# Patient Record
Sex: Female | Born: 1940 | Race: White | Hispanic: No | Marital: Married | Smoking: Never smoker
Health system: Southern US, Community
[De-identification: ages and names within clinical notes are randomized; demographics above are authoritative.]

## PROBLEM LIST (undated history)

## (undated) DIAGNOSIS — E785 Hyperlipidemia, unspecified: Secondary | ICD-10-CM

## (undated) DIAGNOSIS — R519 Headache, unspecified: Secondary | ICD-10-CM

## (undated) DIAGNOSIS — F419 Anxiety disorder, unspecified: Secondary | ICD-10-CM

## (undated) DIAGNOSIS — H8110 Benign paroxysmal vertigo, unspecified ear: Secondary | ICD-10-CM

## (undated) DIAGNOSIS — I10 Essential (primary) hypertension: Secondary | ICD-10-CM

## (undated) DIAGNOSIS — R002 Palpitations: Secondary | ICD-10-CM

## (undated) DIAGNOSIS — Z87442 Personal history of urinary calculi: Secondary | ICD-10-CM

## (undated) DIAGNOSIS — T7840XA Allergy, unspecified, initial encounter: Secondary | ICD-10-CM

## (undated) DIAGNOSIS — I639 Cerebral infarction, unspecified: Secondary | ICD-10-CM

## (undated) DIAGNOSIS — Z950 Presence of cardiac pacemaker: Secondary | ICD-10-CM

## (undated) HISTORY — DX: Cerebral infarction, unspecified: I63.9

## (undated) HISTORY — DX: Benign paroxysmal vertigo, unspecified ear: H81.10

## (undated) HISTORY — DX: Allergy, unspecified, initial encounter: T78.40XA

## (undated) HISTORY — PX: CATARACT EXTRACTION: SUR2

## (undated) HISTORY — DX: Palpitations: R00.2

## (undated) HISTORY — DX: Hyperlipidemia, unspecified: E78.5

## (undated) HISTORY — DX: Essential (primary) hypertension: I10

## (undated) NOTE — *Deleted (*Deleted)
  Problem: Education: Goal: Knowledge of disease or condition will improve Outcome: Progressing Goal: Knowledge of secondary prevention will improve Outcome: Progressing Goal: Knowledge of patient specific risk factors addressed and post discharge goals established will improve Outcome: Progressing Goal: Individualized Educational Video(s) Outcome: Progressing   Problem: Coping: Goal: Will verbalize positive feelings about self Outcome: Progressing   Problem: Health Behavior/Discharge Planning: Goal: Ability to manage health-related needs will improve Outcome: Progressing   Problem: Self-Care: Goal: Ability to participate in self-care as condition permits will improve Outcome: Progressing   Problem: Nutrition: Goal: Risk of aspiration will decrease Outcome: Progressing   Problem: Ischemic Stroke/TIA Tissue Perfusion: Goal: Complications of ischemic stroke/TIA will be minimized Outcome: Progressing   Problem: Education: Goal: Knowledge of General Education information will improve Description: Including pain rating scale, medication(s)/side effects and non-pharmacologic comfort measures Outcome: Progressing   Problem: Health Behavior/Discharge Planning: Goal: Ability to manage health-related needs will improve Outcome: Progressing   Problem: Clinical Measurements: Goal: Ability to maintain clinical measurements within normal limits will improve Outcome: Progressing Goal: Will remain free from infection Outcome: Progressing Goal: Diagnostic test results will improve Outcome: Progressing Goal: Respiratory complications will improve Outcome: Progressing Goal: Cardiovascular complication will be avoided Outcome: Progressing   Problem: Activity: Goal: Risk for activity intolerance will decrease Outcome: Progressing   Problem: Nutrition: Goal: Adequate nutrition will be maintained Outcome: Progressing   Problem: Coping: Goal: Level of anxiety will decrease  Outcome: Progressing   Problem: Elimination: Goal: Will not experience complications related to bowel motility Outcome: Progressing Goal: Will not experience complications related to urinary retention Outcome: Progressing   Problem: Pain Managment: Goal: General experience of comfort will improve Outcome: Progressing   Problem: Safety: Goal: Ability to remain free from injury will improve Outcome: Progressing   Problem: Skin Integrity: Goal: Risk for impaired skin integrity will decrease Outcome: Progressing

---

## 2003-12-10 ENCOUNTER — Observation Stay (HOSPITAL_COMMUNITY): Admission: RE | Admit: 2003-12-10 | Discharge: 2003-12-11 | Payer: Self-pay | Admitting: Orthopedic Surgery

## 2003-12-10 HISTORY — PX: OTHER SURGICAL HISTORY: SHX169

## 2004-07-07 ENCOUNTER — Ambulatory Visit: Payer: Self-pay | Admitting: Family Medicine

## 2004-08-12 ENCOUNTER — Other Ambulatory Visit: Admission: RE | Admit: 2004-08-12 | Discharge: 2004-08-12 | Payer: Self-pay | Admitting: Family Medicine

## 2004-08-12 ENCOUNTER — Ambulatory Visit: Payer: Self-pay | Admitting: Family Medicine

## 2004-12-16 ENCOUNTER — Ambulatory Visit: Payer: Self-pay | Admitting: Family Medicine

## 2006-05-08 ENCOUNTER — Ambulatory Visit: Payer: Self-pay | Admitting: Family Medicine

## 2008-03-07 ENCOUNTER — Encounter: Payer: Self-pay | Admitting: Family Medicine

## 2009-06-16 ENCOUNTER — Ambulatory Visit: Payer: Self-pay | Admitting: Family Medicine

## 2009-06-16 DIAGNOSIS — H8110 Benign paroxysmal vertigo, unspecified ear: Secondary | ICD-10-CM | POA: Insufficient documentation

## 2009-06-16 DIAGNOSIS — J309 Allergic rhinitis, unspecified: Secondary | ICD-10-CM | POA: Insufficient documentation

## 2010-05-26 NOTE — Procedures (Signed)
Summary: Colonoscopy Report/[facility]  Colonoscopy Report/[facility]   Imported By: Maryln Gottron 03/13/2008 15:16:32  _____________________________________________________________________  External Attachment:    Type:   Image     Comment:   External Document

## 2010-05-26 NOTE — Assessment & Plan Note (Signed)
Summary: INNER EYE PROBLEMS/[facility]   Vital Signs:  Patient profile:   70 year old female Weight:      140 pounds Temp:     98.0 degrees F oral Pulse rate:   73 / minute BP sitting:   132 / 94  (left arm) Cuff size:   regular  Vitals Entered By: Alfred Levins, CMA (June 16, 2009 3:06 PM) CC: dizzy, lt inner ear pain, sinus infection   History of Present Illness: Here for one week of dizziness which she describes as the room spinning. Sudden movements of the head up or down bring this on, also getting up and down from bed. Side to side movements do not. These spells last just a few minutes, and they are often accompanied by slight nausea. No HAs. No blurred vision. No fever. She has had a lot of sinus congestion over the winter.   Current Medications (verified): 1)  None  Allergies (verified): 1)  ! Tylenol  Past History:  Past Medical History: frequent UTIs, saw Dr. Retta Diones  Past Surgical History: Tubal ligation Colonoscopy 03-07-08 per Dr. 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 Juliene Pina  Review of Systems  The patient denies anorexia, fever, weight loss, weight gain, vision loss, decreased hearing, hoarseness, chest pain, syncope, dyspnea on exertion, peripheral edema, prolonged cough, headaches, hemoptysis, abdominal pain, melena, hematochezia, severe indigestion/heartburn, hematuria, incontinence, genital sores, muscle weakness, suspicious skin lesions, transient blindness, difficulty walking, depression, unusual weight change, abnormal bleeding, enlarged lymph nodes, angioedema, breast masses, and testicular masses.    Physical Exam  General:  Well-developed,well-nourished,in no acute distress; alert,appropriate and cooperative throughout examination Head:  Normocephalic and atraumatic without obvious abnormalities. No apparent alopecia or balding. Eyes:  No corneal or conjunctival  inflammation noted. EOMI. Perrla. Funduscopic exam benign, without hemorrhages, exudates or papilledema. Vision grossly normal. Ears:  External ear exam shows no significant lesions or deformities.  Otoscopic examination reveals clear canals, tympanic membranes are intact bilaterally without bulging, retraction, inflammation or discharge. Hearing is grossly normal bilaterally. Nose:  External nasal examination shows no deformity or inflammation. Nasal mucosa are pink and moist without lesions or exudates. Mouth:  Oral mucosa and oropharynx without lesions or exudates.  Teeth in good repair. Neck:  No deformities, masses, or tenderness noted. Lungs:  Normal respiratory effort, chest expands symmetrically. Lungs are clear to auscultation, no crackles or wheezes. Heart:  Normal rate and regular rhythm. S1 and S2 normal without gallop, murmur, click, rub or other extra sounds. Neurologic:  alert & oriented X3, cranial nerves II-XII intact, strength normal in all extremities, and gait normal.     Impression & Recommendations:  Problem # 1:  BENIGN POSITIONAL VERTIGO (ICD-386.11)  Her updated medication list for this problem includes:    Meclizine Hcl 25 Mg Tabs (Meclizine hcl) .Marland Kitchen... 1 q 4 hours as needed dizziness  Problem # 2:  ALLERGIC RHINITIS (ICD-477.9)  Complete Medication List: 1)  Meclizine Hcl 25 Mg Tabs (Meclizine hcl) .Marland Kitchen.. 1 q 4 hours as needed dizziness 2)  Zyrtec-d Allergy & Congestion 5-120 Mg Xr12h-tab (Cetirizine-pseudoephedrine) .... Two times a day as needed  Patient Instructions: 1)  Rest, fluids, Zyrtec D for congestion. Use meclizine as needed .  2)  Please schedule a follow-up appointment as needed .  Prescriptions: MECLIZINE HCL 25 MG TABS (MECLIZINE HCL) 1 q 4 hours as needed dizziness  #60 x 5   Entered and Authorized by:   Jeannett Senior  Marguerita Beards MD   Signed by:   Nelwyn Salisbury MD on 06/16/2009   Method used:   Electronically to        [facility]  [phone]  (retail)       [address] [place], [place]  [address]       Ph: [phone] or [phone]       Fax: [phone]   RxID:   [id number]

## 2010-09-10 NOTE — Op Note (Signed)
Mary Mercado, Mary Mercado                         ACCOUNT NO.:  [id number]   MEDICAL RECORD NO.:  [id number]                   PATIENT TYPE:  AMB   LOCATION:  DAY                                  FACILITY:  [facility]   PHYSICIAN:  Ronald A. Gioffre, M.D.             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   OPERATION:  Hemilaminectomy and microdiskectomy at L4-5 on the left.   SURGEON:  Georges Lynch. Darrelyn Hillock, M.D.   ASSISTANT:  Jene Every, M.D.   DESCRIPTION OF PROCEDURE:  Under general anesthesia, a routine orthopedic  prep and draping of the lower back was carried out. The patient had 1 g of  IV Ancef.  Two needles were placed in the back for localization purposes, x-  ray was taken to verify the position. We then made an incision over L4-5,  bleeders identified and cauterized and a second x-ray was taken to further  verify the L4-5 position on the left. At this time, we went down, did a  hemilaminectomy in the usual fashion, we removed the ligamentum flavum and  protected the dura at all times.  The nerve root was quite edematous and  displaced because of the large fragment so we went out laterally with our  hemilaminectomy and did a nice foraminotomy first to decompress the root and  then gently retracted the root.  We cauterized the lateral recess veins,  made an incision in the posterior longitudinal ligament and did a complete  diskectomy. We utilized the nerve hook and removed a large fragment that  migrated up under the root distally.  Once this was done, the root was  nicely decompressed, we could easily retract the dura and the root.  We then  went into the space  and completed the diskectomy. We also checked the axilla  of the root and it was free. The root now was free distally and the dura and  root above were free. We thoroughly irrigated out the irrigant, hemostasis  was maintained. We loosely applied some thrombin soaked Gelfoam, closed the  wound in layers in the usual fashion. The proximal deep portion of the wound  was slightly left open for decompression for purposes.  A sterile Neosporin  dressing was applied. The patient left the operating room in satisfactory  condition.  Ronald A. Darrelyn Hillock, M.D.    RAG/MEDQ  D:  12/10/2003  T:  12/11/2003  Job:  [id number]

## 2011-02-01 ENCOUNTER — Emergency Department (HOSPITAL_COMMUNITY): Payer: Medicare Other

## 2011-02-01 ENCOUNTER — Emergency Department (HOSPITAL_COMMUNITY)
Admission: EM | Admit: 2011-02-01 | Discharge: 2011-02-01 | Disposition: A | Discharge status: Home / Self Care | Payer: Medicare Other | Attending: Emergency Medicine | Admitting: Emergency Medicine

## 2011-02-01 DIAGNOSIS — K5289 Other specified noninfective gastroenteritis and colitis: Secondary | ICD-10-CM | POA: Insufficient documentation

## 2011-02-01 DIAGNOSIS — R1031 Right lower quadrant pain: Secondary | ICD-10-CM | POA: Insufficient documentation

## 2011-02-01 DIAGNOSIS — N200 Calculus of kidney: Secondary | ICD-10-CM | POA: Insufficient documentation

## 2011-02-01 LAB — URINALYSIS, ROUTINE W REFLEX MICROSCOPIC
Bilirubin Urine: NEGATIVE
Glucose, UA: NEGATIVE mg/dL
Ketones, ur: NEGATIVE mg/dL
Leukocytes, UA: NEGATIVE
Nitrite: NEGATIVE
Protein, ur: NEGATIVE mg/dL
Specific Gravity, Urine: 1.019 (ref 1.005–1.030)
Urobilinogen, UA: 0.2 mg/dL (ref 0.0–1.0)
pH: 5.5 (ref 5.0–8.0)

## 2011-02-01 LAB — DIFFERENTIAL
Basophils Absolute: 0 10*3/uL (ref 0.0–0.1)
Basophils Relative: 0 % (ref 0–1)
Eosinophils Absolute: 0 10*3/uL (ref 0.0–0.7)
Eosinophils Relative: 0 % (ref 0–5)
Lymphocytes Relative: 9 % — ABNORMAL LOW (ref 12–46)
Lymphs Abs: 0.9 10*3/uL (ref 0.7–4.0)
Monocytes Absolute: 0.5 10*3/uL (ref 0.1–1.0)
Monocytes Relative: 6 % (ref 3–12)
Neutro Abs: 7.8 10*3/uL — ABNORMAL HIGH (ref 1.7–7.7)
Neutrophils Relative %: 84 % — ABNORMAL HIGH (ref 43–77)

## 2011-02-01 LAB — CBC
HCT: 39.8 % (ref 36.0–46.0)
Hemoglobin: 13.2 g/dL (ref 12.0–15.0)
MCH: 29.9 pg (ref 26.0–34.0)
MCHC: 33.2 g/dL (ref 30.0–36.0)
MCV: 90 fL (ref 78.0–100.0)
Platelets: 242 10*3/uL (ref 150–400)
RBC: 4.42 MIL/uL (ref 3.87–5.11)
RDW: 12.8 % (ref 11.5–15.5)
WBC: 9.2 10*3/uL (ref 4.0–10.5)

## 2011-02-01 LAB — BASIC METABOLIC PANEL
BUN: 12 mg/dL (ref 6–23)
CO2: 31 mEq/L (ref 19–32)
Calcium: 10.5 mg/dL (ref 8.4–10.5)
Chloride: 96 mEq/L (ref 96–112)
Creatinine, Ser: 0.6 mg/dL (ref 0.50–1.10)
GFR calc Af Amer: >90 mL/min (ref >90)
GFR calc non Af Amer: 90 mL/min — ABNORMAL LOW (ref >90)
Glucose, Bld: 122 mg/dL — ABNORMAL HIGH (ref 70–99)
Potassium: 3.7 mEq/L (ref 3.5–5.1)
Sodium: 135 mEq/L (ref 135–145)

## 2011-02-01 LAB — URINE MICROSCOPIC-ADD ON

## 2011-05-18 DIAGNOSIS — H524 Presbyopia: Secondary | ICD-10-CM | POA: Diagnosis not present

## 2011-05-18 DIAGNOSIS — H2510 Age-related nuclear cataract, unspecified eye: Secondary | ICD-10-CM | POA: Diagnosis not present

## 2011-09-12 DIAGNOSIS — L821 Other seborrheic keratosis: Secondary | ICD-10-CM | POA: Diagnosis not present

## 2011-09-12 DIAGNOSIS — D485 Neoplasm of uncertain behavior of skin: Secondary | ICD-10-CM | POA: Diagnosis not present

## 2011-09-12 DIAGNOSIS — D0460 Carcinoma in situ of skin of unspecified upper limb, including shoulder: Secondary | ICD-10-CM | POA: Diagnosis not present

## 2011-09-12 DIAGNOSIS — Z85828 Personal history of other malignant neoplasm of skin: Secondary | ICD-10-CM | POA: Diagnosis not present

## 2011-09-20 DIAGNOSIS — M545 Low back pain, unspecified: Secondary | ICD-10-CM | POA: Diagnosis not present

## 2011-09-20 DIAGNOSIS — R209 Unspecified disturbances of skin sensation: Secondary | ICD-10-CM | POA: Diagnosis not present

## 2011-10-01 DIAGNOSIS — M545 Low back pain, unspecified: Secondary | ICD-10-CM | POA: Diagnosis not present

## 2011-10-03 DIAGNOSIS — D0460 Carcinoma in situ of skin of unspecified upper limb, including shoulder: Secondary | ICD-10-CM | POA: Diagnosis not present

## 2012-01-02 DIAGNOSIS — Z01419 Encounter for gynecological examination (general) (routine) without abnormal findings: Secondary | ICD-10-CM | POA: Diagnosis not present

## 2012-01-02 DIAGNOSIS — Z124 Encounter for screening for malignant neoplasm of cervix: Secondary | ICD-10-CM | POA: Diagnosis not present

## 2012-01-09 DIAGNOSIS — Z1231 Encounter for screening mammogram for malignant neoplasm of breast: Secondary | ICD-10-CM | POA: Diagnosis not present

## 2012-01-09 DIAGNOSIS — M899 Disorder of bone, unspecified: Secondary | ICD-10-CM | POA: Diagnosis not present

## 2012-01-09 DIAGNOSIS — M949 Disorder of cartilage, unspecified: Secondary | ICD-10-CM | POA: Diagnosis not present

## 2012-01-09 DIAGNOSIS — N951 Menopausal and female climacteric states: Secondary | ICD-10-CM | POA: Diagnosis not present

## 2012-01-18 DIAGNOSIS — R1032 Left lower quadrant pain: Secondary | ICD-10-CM | POA: Diagnosis not present

## 2012-01-19 ENCOUNTER — Other Ambulatory Visit: Payer: Self-pay | Admitting: Gastroenterology

## 2012-01-19 DIAGNOSIS — Z1211 Encounter for screening for malignant neoplasm of colon: Secondary | ICD-10-CM | POA: Diagnosis not present

## 2012-01-19 DIAGNOSIS — R110 Nausea: Secondary | ICD-10-CM | POA: Diagnosis not present

## 2012-01-19 DIAGNOSIS — R1013 Epigastric pain: Secondary | ICD-10-CM

## 2012-01-19 DIAGNOSIS — Z800 Family history of malignant neoplasm of digestive organs: Secondary | ICD-10-CM | POA: Diagnosis not present

## 2012-01-23 ENCOUNTER — Other Ambulatory Visit: Payer: Self-pay | Admitting: Gastroenterology

## 2012-01-23 DIAGNOSIS — R1013 Epigastric pain: Secondary | ICD-10-CM

## 2012-01-23 DIAGNOSIS — R110 Nausea: Secondary | ICD-10-CM

## 2012-02-06 ENCOUNTER — Ambulatory Visit (HOSPITAL_COMMUNITY)
Admission: RE | Admit: 2012-02-06 | Discharge: 2012-02-06 | Disposition: A | Discharge status: Home / Self Care | Payer: Medicare Other | Source: Ambulatory Visit | Attending: Gastroenterology | Admitting: Gastroenterology

## 2012-02-06 ENCOUNTER — Encounter (HOSPITAL_COMMUNITY)
Admission: RE | Admit: 2012-02-06 | Discharge: 2012-02-06 | Disposition: A | Discharge status: Home / Self Care | Payer: Medicare Other | Source: Ambulatory Visit | Attending: Gastroenterology | Admitting: Gastroenterology

## 2012-02-06 DIAGNOSIS — R110 Nausea: Secondary | ICD-10-CM | POA: Diagnosis not present

## 2012-02-06 DIAGNOSIS — R1013 Epigastric pain: Secondary | ICD-10-CM

## 2012-02-06 DIAGNOSIS — R109 Unspecified abdominal pain: Secondary | ICD-10-CM | POA: Diagnosis not present

## 2012-02-06 MED ORDER — SINCALIDE 5 MCG IJ SOLR
0.0200 ug/kg | Freq: Once | INTRAMUSCULAR | Status: AC
  Administered 2012-02-06: 1.3 ug via INTRAVENOUS

## 2012-02-06 MED ORDER — TECHNETIUM TC 99M MEBROFENIN IV KIT
5.0000 | PACK | Freq: Once | INTRAVENOUS | Status: AC | PRN
  Administered 2012-02-06: 5 via INTRAVENOUS

## 2012-04-06 DIAGNOSIS — Z1211 Encounter for screening for malignant neoplasm of colon: Secondary | ICD-10-CM | POA: Diagnosis not present

## 2012-04-06 DIAGNOSIS — Z800 Family history of malignant neoplasm of digestive organs: Secondary | ICD-10-CM | POA: Diagnosis not present

## 2012-04-06 DIAGNOSIS — D126 Benign neoplasm of colon, unspecified: Secondary | ICD-10-CM | POA: Diagnosis not present

## 2012-04-06 DIAGNOSIS — D128 Benign neoplasm of rectum: Secondary | ICD-10-CM | POA: Diagnosis not present

## 2012-04-06 DIAGNOSIS — D129 Benign neoplasm of anus and anal canal: Secondary | ICD-10-CM | POA: Diagnosis not present

## 2012-04-06 DIAGNOSIS — K620 Anal polyp: Secondary | ICD-10-CM | POA: Diagnosis not present

## 2012-04-06 DIAGNOSIS — K621 Rectal polyp: Secondary | ICD-10-CM | POA: Diagnosis not present

## 2012-05-07 DIAGNOSIS — C44320 Squamous cell carcinoma of skin of unspecified parts of face: Secondary | ICD-10-CM | POA: Diagnosis not present

## 2012-05-07 DIAGNOSIS — Z85828 Personal history of other malignant neoplasm of skin: Secondary | ICD-10-CM | POA: Diagnosis not present

## 2012-05-07 DIAGNOSIS — D485 Neoplasm of uncertain behavior of skin: Secondary | ICD-10-CM | POA: Diagnosis not present

## 2012-05-24 DIAGNOSIS — C44320 Squamous cell carcinoma of skin of unspecified parts of face: Secondary | ICD-10-CM | POA: Diagnosis not present

## 2012-05-24 DIAGNOSIS — L570 Actinic keratosis: Secondary | ICD-10-CM | POA: Diagnosis not present

## 2012-09-13 DIAGNOSIS — H6120 Impacted cerumen, unspecified ear: Secondary | ICD-10-CM | POA: Diagnosis not present

## 2012-09-13 DIAGNOSIS — H902 Conductive hearing loss, unspecified: Secondary | ICD-10-CM | POA: Diagnosis not present

## 2012-10-01 DIAGNOSIS — L570 Actinic keratosis: Secondary | ICD-10-CM | POA: Diagnosis not present

## 2012-10-01 DIAGNOSIS — D045 Carcinoma in situ of skin of trunk: Secondary | ICD-10-CM | POA: Diagnosis not present

## 2012-10-01 DIAGNOSIS — L821 Other seborrheic keratosis: Secondary | ICD-10-CM | POA: Diagnosis not present

## 2012-10-01 DIAGNOSIS — Z85828 Personal history of other malignant neoplasm of skin: Secondary | ICD-10-CM | POA: Diagnosis not present

## 2012-10-01 DIAGNOSIS — C44319 Basal cell carcinoma of skin of other parts of face: Secondary | ICD-10-CM | POA: Diagnosis not present

## 2012-10-01 DIAGNOSIS — D485 Neoplasm of uncertain behavior of skin: Secondary | ICD-10-CM | POA: Diagnosis not present

## 2012-11-22 DIAGNOSIS — C44319 Basal cell carcinoma of skin of other parts of face: Secondary | ICD-10-CM | POA: Diagnosis not present

## 2013-04-30 DIAGNOSIS — L821 Other seborrheic keratosis: Secondary | ICD-10-CM | POA: Diagnosis not present

## 2013-04-30 DIAGNOSIS — Z85828 Personal history of other malignant neoplasm of skin: Secondary | ICD-10-CM | POA: Diagnosis not present

## 2013-04-30 DIAGNOSIS — D235 Other benign neoplasm of skin of trunk: Secondary | ICD-10-CM | POA: Diagnosis not present

## 2013-04-30 DIAGNOSIS — D1801 Hemangioma of skin and subcutaneous tissue: Secondary | ICD-10-CM | POA: Diagnosis not present

## 2013-07-03 ENCOUNTER — Encounter: Payer: Self-pay | Admitting: Family Medicine

## 2013-07-03 ENCOUNTER — Ambulatory Visit: Payer: Medicare Other | Admitting: Family Medicine

## 2013-07-03 ENCOUNTER — Ambulatory Visit (INDEPENDENT_AMBULATORY_CARE_PROVIDER_SITE_OTHER): Payer: Medicare Other | Admitting: Family Medicine

## 2013-07-03 VITALS — BP 140/80 | HR 79 | Temp 98.7°F | Ht 65.0 in | Wt 140.0 lb

## 2013-07-03 DIAGNOSIS — J019 Acute sinusitis, unspecified: Secondary | ICD-10-CM

## 2013-07-03 MED ORDER — AZITHROMYCIN 250 MG PO TABS: ORAL_TABLET | ORAL | Status: DC

## 2013-07-03 NOTE — Progress Notes (Signed)
   Subjective:    Patient ID: Mary Mercado, female    DOB: 1941/01/06, 73 y.o.   MRN: [id number]  HPI 73 yr old female to re-establish after an absence of 4 years complaining of 4 days of sinus pressure, PND, chest tightness and coughing up green sputum.    Review of Systems  Constitutional: Negative.   HENT: Positive for congestion and postnasal drip. Negative for sinus pressure.   Eyes: Negative.   Respiratory: Positive for cough.        Objective:   Physical Exam  Constitutional: She appears well-developed and well-nourished.  HENT:  Right Ear: External ear normal.  Left Ear: External ear normal.  Nose: Nose normal.  Mouth/Throat: Oropharynx is clear and moist.  Eyes: Conjunctivae are normal.  Pulmonary/Chest: Effort normal. No respiratory distress. She has no wheezes. She has no rales.  Scattered rhonchi   Lymphadenopathy:    She has no cervical adenopathy.          Assessment & Plan:  Add Mucinex

## 2013-07-03 NOTE — Progress Notes (Signed)
Pre visit review using our clinic review tool, if applicable. No additional management support is needed unless otherwise documented below in the visit note. 

## 2013-10-10 DIAGNOSIS — D1801 Hemangioma of skin and subcutaneous tissue: Secondary | ICD-10-CM | POA: Diagnosis not present

## 2013-10-10 DIAGNOSIS — I83893 Varicose veins of bilateral lower extremities with other complications: Secondary | ICD-10-CM | POA: Diagnosis not present

## 2013-10-10 DIAGNOSIS — Z85828 Personal history of other malignant neoplasm of skin: Secondary | ICD-10-CM | POA: Diagnosis not present

## 2013-10-10 DIAGNOSIS — I8390 Asymptomatic varicose veins of unspecified lower extremity: Secondary | ICD-10-CM | POA: Diagnosis not present

## 2013-10-10 DIAGNOSIS — L905 Scar conditions and fibrosis of skin: Secondary | ICD-10-CM | POA: Diagnosis not present

## 2013-10-10 DIAGNOSIS — L821 Other seborrheic keratosis: Secondary | ICD-10-CM | POA: Diagnosis not present

## 2013-10-10 DIAGNOSIS — L723 Sebaceous cyst: Secondary | ICD-10-CM | POA: Diagnosis not present

## 2013-10-10 DIAGNOSIS — IMO0002 Reserved for concepts with insufficient information to code with codable children: Secondary | ICD-10-CM | POA: Diagnosis not present

## 2013-10-16 DIAGNOSIS — L218 Other seborrheic dermatitis: Secondary | ICD-10-CM | POA: Diagnosis not present

## 2014-02-23 LAB — HM MAMMOGRAPHY

## 2014-03-27 ENCOUNTER — Other Ambulatory Visit: Payer: Self-pay | Admitting: Obstetrics and Gynecology

## 2014-03-27 DIAGNOSIS — Z124 Encounter for screening for malignant neoplasm of cervix: Secondary | ICD-10-CM | POA: Diagnosis not present

## 2014-03-27 DIAGNOSIS — Z1231 Encounter for screening mammogram for malignant neoplasm of breast: Secondary | ICD-10-CM | POA: Diagnosis not present

## 2014-03-28 LAB — CYTOLOGY - PAP

## 2014-04-08 DIAGNOSIS — L720 Epidermal cyst: Secondary | ICD-10-CM | POA: Diagnosis not present

## 2014-04-08 DIAGNOSIS — D2330 Other benign neoplasm of skin of unspecified part of face: Secondary | ICD-10-CM | POA: Diagnosis not present

## 2014-04-08 DIAGNOSIS — L821 Other seborrheic keratosis: Secondary | ICD-10-CM | POA: Diagnosis not present

## 2014-04-08 DIAGNOSIS — L739 Follicular disorder, unspecified: Secondary | ICD-10-CM | POA: Diagnosis not present

## 2014-04-08 DIAGNOSIS — D1801 Hemangioma of skin and subcutaneous tissue: Secondary | ICD-10-CM | POA: Diagnosis not present

## 2014-04-08 DIAGNOSIS — Z85828 Personal history of other malignant neoplasm of skin: Secondary | ICD-10-CM | POA: Diagnosis not present

## 2014-05-22 DIAGNOSIS — H2513 Age-related nuclear cataract, bilateral: Secondary | ICD-10-CM | POA: Diagnosis not present

## 2014-05-22 DIAGNOSIS — H40003 Preglaucoma, unspecified, bilateral: Secondary | ICD-10-CM | POA: Diagnosis not present

## 2014-05-22 DIAGNOSIS — H04129 Dry eye syndrome of unspecified lacrimal gland: Secondary | ICD-10-CM | POA: Diagnosis not present

## 2014-09-10 DIAGNOSIS — H04129 Dry eye syndrome of unspecified lacrimal gland: Secondary | ICD-10-CM | POA: Diagnosis not present

## 2014-09-10 DIAGNOSIS — H2513 Age-related nuclear cataract, bilateral: Secondary | ICD-10-CM | POA: Diagnosis not present

## 2014-09-10 DIAGNOSIS — H40003 Preglaucoma, unspecified, bilateral: Secondary | ICD-10-CM | POA: Diagnosis not present

## 2014-10-08 DIAGNOSIS — L821 Other seborrheic keratosis: Secondary | ICD-10-CM | POA: Diagnosis not present

## 2014-10-08 DIAGNOSIS — Z85828 Personal history of other malignant neoplasm of skin: Secondary | ICD-10-CM | POA: Diagnosis not present

## 2014-10-08 DIAGNOSIS — L570 Actinic keratosis: Secondary | ICD-10-CM | POA: Diagnosis not present

## 2014-10-08 DIAGNOSIS — D1801 Hemangioma of skin and subcutaneous tissue: Secondary | ICD-10-CM | POA: Diagnosis not present

## 2014-10-14 ENCOUNTER — Encounter: Payer: Self-pay | Admitting: *Deleted

## 2015-05-05 ENCOUNTER — Ambulatory Visit (INDEPENDENT_AMBULATORY_CARE_PROVIDER_SITE_OTHER): Payer: Medicare Other | Admitting: Family Medicine

## 2015-05-05 ENCOUNTER — Encounter: Payer: Self-pay | Admitting: Family Medicine

## 2015-05-05 VITALS — BP 130/80 | HR 71 | Temp 97.5°F | Ht 65.0 in | Wt 147.5 lb

## 2015-05-05 DIAGNOSIS — R000 Tachycardia, unspecified: Secondary | ICD-10-CM

## 2015-05-05 NOTE — Progress Notes (Signed)
   Subjective:    Patient ID: Mary Mercado, female    DOB: May 14, 1940, 75 y.o.   MRN: [id number]  HPI Here fluttering and palpitations in the chest. Over the past year she has had numerous episodes of this which are brief, lasting only 5-10 seconds at a time. However early this am she was awakened by palpitations and a feeling that her heart was racing. No SOB or chest pain with this. This lasted about 2 hours before it stopped. She felt very tired after this but had no other specific symptoms. She has felt fine ever since. No recent change in medications or diet. She uses little caffeine.    Review of Systems  Constitutional: Negative.   Respiratory: Negative.   Cardiovascular: Positive for palpitations. Negative for chest pain and leg swelling.  Gastrointestinal: Negative.   Neurological: Negative.        Objective:   Physical Exam  Constitutional: She is oriented to person, place, and time. She appears well-developed and well-nourished. No distress.  Neck: Neck supple. No thyromegaly present.  Cardiovascular: Normal rate, regular rhythm, normal heart sounds and intact distal pulses.   EKG shows NSR with good rate control   Pulmonary/Chest: Effort normal and breath sounds normal.  Lymphadenopathy:    She has no cervical adenopathy.  Neurological: She is alert and oriented to person, place, and time.          Assessment & Plan:  Palpitations, possibly due to paroxysmal atrial fibrillation. Start on ASA 81 mg daily. See Cardiology soon and get labs today

## 2015-05-05 NOTE — Progress Notes (Signed)
Pre visit review using our clinic review tool, if applicable. No additional management support is needed unless otherwise documented below in the visit note. 

## 2015-05-06 LAB — BASIC METABOLIC PANEL
BUN: 13 mg/dL (ref 6–23)
CO2: 33 mEq/L — ABNORMAL HIGH (ref 19–32)
Calcium: 10.2 mg/dL (ref 8.4–10.5)
Chloride: 102 mEq/L (ref 96–112)
Creatinine, Ser: 0.68 mg/dL (ref 0.40–1.20)
GFR: 89.42 mL/min (ref >60.00)
Glucose, Bld: 96 mg/dL (ref 70–99)
Potassium: 5.6 mEq/L — ABNORMAL HIGH (ref 3.5–5.1)
Sodium: 144 mEq/L (ref 135–145)

## 2015-05-06 LAB — CBC WITH DIFFERENTIAL/PLATELET
Basophils Absolute: 0.1 10*3/uL (ref 0.0–0.1)
Basophils Relative: 1.5 % (ref 0.0–3.0)
Eosinophils Absolute: 0.1 10*3/uL (ref 0.0–0.7)
Eosinophils Relative: 0.8 % (ref 0.0–5.0)
HCT: 42.3 % (ref 36.0–46.0)
Hemoglobin: 13.9 g/dL (ref 12.0–15.0)
Lymphocytes Relative: 26.8 % (ref 12.0–46.0)
Lymphs Abs: 1.7 10*3/uL (ref 0.7–4.0)
MCHC: 32.7 g/dL (ref 30.0–36.0)
MCV: 91.3 fl (ref 78.0–100.0)
Monocytes Absolute: 0.7 10*3/uL (ref 0.1–1.0)
Monocytes Relative: 11.2 % (ref 3.0–12.0)
Neutro Abs: 3.8 10*3/uL (ref 1.4–7.7)
Neutrophils Relative %: 59.7 % (ref 43.0–77.0)
Platelets: 249 10*3/uL (ref 150.0–400.0)
RBC: 4.64 Mil/uL (ref 3.87–5.11)
RDW: 13.9 % (ref 11.5–15.5)
WBC: 6.4 10*3/uL (ref 4.0–10.5)

## 2015-05-06 LAB — HEPATIC FUNCTION PANEL
ALT: 14 U/L (ref 0–35)
AST: 18 U/L (ref 0–37)
Albumin: 4.8 g/dL (ref 3.5–5.2)
Alkaline Phosphatase: 65 U/L (ref 39–117)
Bilirubin, Direct: 0.1 mg/dL (ref 0.0–0.3)
Total Bilirubin: 0.6 mg/dL (ref 0.2–1.2)
Total Protein: 7.1 g/dL (ref 6.0–8.3)

## 2015-05-06 LAB — TSH: TSH: 1.71 u[IU]/mL (ref 0.35–4.50)

## 2015-05-28 ENCOUNTER — Ambulatory Visit (INDEPENDENT_AMBULATORY_CARE_PROVIDER_SITE_OTHER): Payer: Medicare Other | Admitting: Cardiovascular Disease

## 2015-05-28 ENCOUNTER — Encounter: Payer: Self-pay | Admitting: Cardiovascular Disease

## 2015-05-28 VITALS — BP 126/62 | HR 64 | Ht 65.0 in | Wt 147.0 lb

## 2015-05-28 DIAGNOSIS — I493 Ventricular premature depolarization: Secondary | ICD-10-CM | POA: Diagnosis not present

## 2015-05-28 DIAGNOSIS — I471 Supraventricular tachycardia: Secondary | ICD-10-CM | POA: Insufficient documentation

## 2015-05-28 DIAGNOSIS — I4891 Unspecified atrial fibrillation: Secondary | ICD-10-CM | POA: Insufficient documentation

## 2015-05-28 NOTE — Patient Instructions (Signed)

## 2015-05-28 NOTE — Progress Notes (Signed)
Cardiology Office Note   Date:  05/28/2015   ID:  Ekam Kui, DOB [birth year], MRN [id number]  PCP:  [name], MD  Cardiologist:   Thayer Headings, MD   Chief Complaint  Patient presents with  . Tachycardia   Problem list 1.  Palpitations   History of Present Illness: Mary Mercado is a 75 y.o. female who presents for evaluation of an episode of tachypalpitations   [name] was seen with her husband , [name].  She woke up with heart racing - lasted for 3-4 hours. Very rapid and regular  She took several deep breaths,  Went back to sleep and the tachycardia had resolved.   Works out regularly - twice a week for years, now once a week Over the past year, she would have have palpitations that would last for a split second.  Sounds like PVCs  Has been under stress, brother had CABG.   Past Medical History  Diagnosis Date  . Allergy   . Vertigo, benign positional     Past Surgical History  Procedure Laterality Date  . Hemilaminectomy and microdiskectomy at l4-5 on the left.  12/10/2003     Current Outpatient Prescriptions  Medication Sig Dispense Refill  . aspirin 81 MG tablet Take 81 mg by mouth daily.     No current facility-administered medications for this visit.    Allergies:   Acetaminophen    Social History:  The patient  reports that she has never smoked. She has never used smokeless tobacco. She reports that she does not drink alcohol or use illicit drugs.   Family History:  The patient's family history includes Colon cancer in her father; Diabetes in her mother; Heart attack in her mother; Heart attack (age of onset: 17) in her brother.    ROS:  Please see the history of present illness.    Review of Systems: Constitutional:  denies fever, chills, diaphoresis, appetite change and fatigue.  HEENT: denies photophobia, eye pain, redness, hearing loss, ear pain, congestion, sore throat, rhinorrhea, sneezing, neck pain, neck stiffness and  tinnitus.  Respiratory: denies SOB, DOE, cough, chest tightness, and wheezing.  Cardiovascular: denies chest pain, palpitations and leg swelling.  Gastrointestinal: denies nausea, vomiting, abdominal pain, diarrhea, constipation, blood in stool.  Genitourinary: denies dysuria, urgency, frequency, hematuria, flank pain and difficulty urinating.  Musculoskeletal: denies  myalgias, back pain, joint swelling, arthralgias and gait problem.   Skin: denies pallor, rash and wound.  Neurological: denies dizziness, seizures, syncope, weakness, light-headedness, numbness and headaches.   Hematological: denies adenopathy, easy bruising, personal or family bleeding history.  Psychiatric/ Behavioral: denies suicidal ideation, mood changes, confusion, nervousness, sleep disturbance and agitation.       All other systems are reviewed and negative.    PHYSICAL EXAM: VS:  BP 126/62 mmHg  Pulse 64  Ht 5\' 5"  (1.651 m)  Wt 147 lb (66.679 kg)  BMI 24.46 kg/m2 , BMI Body mass index is 24.46 kg/(m^2). GEN: Well nourished, well developed, in no acute distress HEENT: normal Neck: no JVD, carotid bruits, or masses Cardiac: RRR; no murmurs, rubs, or gallops,no edema  Respiratory:  clear to auscultation bilaterally, normal work of breathing GI: soft, nontender, nondistended, + BS MS: no deformity or atrophy Skin: warm and dry, no rash Neuro:  Strength and sensation are intact Psych: normal   EKG:  EKG is not ordered today. ECG from Jan. 10,  2017 - NSR at 64.   Normal PR.    Recent Labs: 05/05/2015:  ALT 14; BUN 13; Creatinine, Ser 0.68; Hemoglobin 13.9; Platelets 249.0; Potassium 5.6*; Sodium 144; TSH 1.71    Lipid Panel No results found for: CHOL, TRIG, HDL, CHOLHDL, VLDL, LDLCALC, LDLDIRECT    Wt Readings from Last 3 Encounters:  05/28/15 147 lb (66.679 kg)  05/05/15 147 lb 8 oz (66.906 kg)  07/03/13 140 lb (63.504 kg)      Other studies Reviewed: Additional studies/ records that were  reviewed today include: . Review of the above records demonstrates:    ASSESSMENT AND PLAN:  1.  SVT - discussed the valsalva and stimulation of the diving reflex We discussed having her take Propranolol  10 PRN - She does not think that she'll need it at this time but we will prescribe to her if she has recurrent episodes of SVT.   2. 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 I do not think that she needs any further evaluation of this. If she continues to have lots of premature ventricular contractions, we will consider an echocardiogram  Current medicines are reviewed at length with the patient today.  The patient does not have concerns regarding medicines.  The following changes have been made:  no change  Labs/ tests ordered today include:  No orders of the defined types were placed in this encounter.     Disposition:   FU with 1 year      [name], Wonda Cheng, MD  05/28/2015 12:00 PM    [address], [place], [place]  [address] Phone: [phone]; Fax: [phone]   [facility]  [address] [place], [place]  [address] [phone]   Fax [phone]

## 2015-11-12 DIAGNOSIS — D1801 Hemangioma of skin and subcutaneous tissue: Secondary | ICD-10-CM | POA: Diagnosis not present

## 2015-11-12 DIAGNOSIS — L821 Other seborrheic keratosis: Secondary | ICD-10-CM | POA: Diagnosis not present

## 2015-11-12 DIAGNOSIS — L814 Other melanin hyperpigmentation: Secondary | ICD-10-CM | POA: Diagnosis not present

## 2015-11-12 DIAGNOSIS — Z85828 Personal history of other malignant neoplasm of skin: Secondary | ICD-10-CM | POA: Diagnosis not present

## 2015-11-12 DIAGNOSIS — L570 Actinic keratosis: Secondary | ICD-10-CM | POA: Diagnosis not present

## 2016-08-04 ENCOUNTER — Encounter: Payer: Self-pay | Admitting: Family Medicine

## 2016-08-04 ENCOUNTER — Ambulatory Visit (INDEPENDENT_AMBULATORY_CARE_PROVIDER_SITE_OTHER): Payer: Medicare Other | Admitting: Family Medicine

## 2016-08-04 VITALS — BP 140/82 | HR 70 | Temp 98.3°F | Wt 147.6 lb

## 2016-08-04 DIAGNOSIS — J0110 Acute frontal sinusitis, unspecified: Secondary | ICD-10-CM

## 2016-08-04 MED ORDER — AMOXICILLIN-POT CLAVULANATE 875-125 MG PO TABS: 1.0000 | ORAL_TABLET | Freq: Two times a day (BID) | ORAL | 0 refills | Status: DC

## 2016-08-04 NOTE — Patient Instructions (Addendum)
It was a pleasure to see you today. Please take medication as directed with food and follow up if symptoms do not improve in 3 to 4 days, worsen, or you develop a fever >100.  Mucinex DM can be used for cough.   Sinusitis, Adult Sinusitis is soreness and inflammation of your sinuses. Sinuses are hollow spaces in the bones around your face. They are located:  Around your eyes.  In the middle of your forehead.  Behind your nose.  In your cheekbones. Your sinuses and nasal passages are lined with a stringy fluid (mucus). Mucus normally drains out of your sinuses. When your nasal tissues get inflamed or swollen, the mucus can get trapped or blocked so air cannot flow through your sinuses. This lets bacteria, viruses, and funguses grow, and that leads to infection. Follow these instructions at home: Medicines   Take, use, or apply over-the-counter and prescription medicines only as told by your doctor. These may include nasal sprays.  If you were prescribed an antibiotic medicine, take it as told by your doctor. Do not stop taking the antibiotic even if you start to feel better. Hydrate and Humidify   Drink enough water to keep your pee (urine) clear or pale yellow.  Use a cool mist humidifier to keep the humidity level in your home above 50%.  Breathe in steam for 10-15 minutes, 3-4 times a day or as told by your doctor. You can do this in the bathroom while a hot shower is running.  Try not to spend time in cool or dry air. Rest   Rest as much as possible.  Sleep with your head raised (elevated).  Make sure to get enough sleep each night. General instructions   Put a warm, moist washcloth on your face 3-4 times a day or as told by your doctor. This will help with discomfort.  Wash your hands often with soap and water. If there is no soap and water, use hand sanitizer.  Do not smoke. Avoid being around people who are smoking (secondhand smoke).  Keep all follow-up visits as  told by your doctor. This is important. Contact a doctor if:  You have a fever.  Your symptoms get worse.  Your symptoms do not get better within 10 days. Get help right away if:  You have a very bad headache.  You cannot stop throwing up (vomiting).  You have pain or swelling around your face or eyes.  You have trouble seeing.  You feel confused.  Your neck is stiff.  You have trouble breathing. This information is not intended to replace advice given to you by your health care provider. Make sure you discuss any questions you have with your health care provider. Document Released: 09/28/2007 Document Revised: 12/06/2015 Document Reviewed: 02/04/2015 Elsevier Interactive Patient Education  2017 [employer] NOW OFFER   [facility] FAST TRACK!!!  SAME DAY Appointments for ACUTE CARE  Such as: Sprains, Injuries, cuts, abrasions, rashes, muscle pain, joint pain, back pain Colds, flu, sore throats, headache, allergies, cough, fever  Ear pain, sinus and eye infections Abdominal pain, nausea, vomiting, diarrhea, upset stomach Animal/insect bites  3 Easy Ways to Schedule: Walk-In Scheduling Call in scheduling Mychart Sign-up: [email or web address]

## 2016-08-04 NOTE — Progress Notes (Signed)
Pre visit review using our clinic review tool, if applicable. No additional management support is needed unless otherwise documented below in the visit note. 

## 2016-08-04 NOTE — Progress Notes (Signed)
Patient ID: Mary Mercado, female   DOB: 1940/08/26, 76 y.o.   MRN: [id number]  PCP: [name], MD  Subjective:  Mary Mercado is a 76 y.o. year old very pleasant female patient who presents with Upper Respiratory infection symptoms including nasal congestion, sinus pressure, post nasal drip and cough with productive yellow sputum.  Associated post nasal drip is present. -started: 7 days, symptoms are worsening -previous treatments: Aleve has provided limited benefit -sick contacts/travel/risks: denies flu exposure. Recent sick contact exposure with sick 13 month old grandchild.  No recent antibiotic use. -Hx of: allergies  ROS-denies fever, chills, sweats, SOB, NVD, tooth pain  Pertinent Past Medical History- SVT, PVCs  Medications- reviewed  Current Outpatient Prescriptions  Medication Sig Dispense Refill  . aspirin 81 MG tablet Take 81 mg by mouth daily.     No current facility-administered medications for this visit.     Objective: BP 140/82 (BP Location: Left Arm, Patient Position: Sitting, Cuff Size: Normal)   Pulse 70   Temp 98.3 F (36.8 C) (Oral)   Wt 147 lb 9.6 oz (67 kg)   SpO2 97%   BMI 24.56 kg/m  Gen: NAD, resting comfortably HEENT: Turbinates erythematous, TM normal, right ear has cerumen pharynx mildly erythematous with no tonsilar exudate or edema, positive sinus   CV: RRR no murmurs rubs or gallops Lungs: CTAB no crackles, wheeze, rhonchi Abdomen: soft/nontender/nondistended/normal bowel sounds. No rebound or guarding.  Ext: no edema Skin: warm, dry, no rash Neuro: grossly normal, moves all extremities  Assessment/Plan: 1. Acute frontal sinusitis, recurrence not specified Duration of symptoms that are worsening and recent sick contact exposure with 77 month old grandson supports treatment for sinusitis. Advised use of Mucinex DM for cough and supportive measures of Advised patient on supportive measures:  Get rest, drink plenty of fluids and follow up  if fever >101, if symptoms worsen or if symptoms are not improved in 3 to 4 ddays. Patient verbalizes understanding.   - amoxicillin-clavulanate (AUGMENTIN) 875-125 MG tablet; Take 1 tablet by mouth 2 (two) times daily.  Dispense: 20 tablet; Refill: 0  Finally, we reviewed reasons to return to care including if symptoms worsen or persist or new concerns arise- once again particularly shortness of breath or fever.    Laurita Quint, FNP

## 2016-11-10 DIAGNOSIS — L905 Scar conditions and fibrosis of skin: Secondary | ICD-10-CM | POA: Diagnosis not present

## 2016-11-10 DIAGNOSIS — L814 Other melanin hyperpigmentation: Secondary | ICD-10-CM | POA: Diagnosis not present

## 2016-11-10 DIAGNOSIS — D1801 Hemangioma of skin and subcutaneous tissue: Secondary | ICD-10-CM | POA: Diagnosis not present

## 2016-11-10 DIAGNOSIS — I8393 Asymptomatic varicose veins of bilateral lower extremities: Secondary | ICD-10-CM | POA: Diagnosis not present

## 2016-11-10 DIAGNOSIS — Z85828 Personal history of other malignant neoplasm of skin: Secondary | ICD-10-CM | POA: Diagnosis not present

## 2016-11-10 DIAGNOSIS — L821 Other seborrheic keratosis: Secondary | ICD-10-CM | POA: Diagnosis not present

## 2017-06-03 ENCOUNTER — Ambulatory Visit (INDEPENDENT_AMBULATORY_CARE_PROVIDER_SITE_OTHER): Payer: Medicare Other | Admitting: Family Medicine

## 2017-06-03 ENCOUNTER — Encounter: Payer: Self-pay | Admitting: Family Medicine

## 2017-06-03 VITALS — BP 140/80 | HR 69 | Temp 97.6°F | Ht 65.0 in | Wt 133.0 lb

## 2017-06-03 DIAGNOSIS — J069 Acute upper respiratory infection, unspecified: Secondary | ICD-10-CM | POA: Diagnosis not present

## 2017-06-03 NOTE — Progress Notes (Signed)
   Subjective   CC:  Chief Complaint  Patient presents with  . Sinus Problem  . Nasal Congestion  . Headache    HPI: Mary Mercado is a 77 y.o. female who presents to the office today to address the problems listed above in the chief complaint. Patient complains of typical URI symptoms including nasal congestion, mild sore throat, cough, and mild malaise.  The symptoms have been present for 1 day. She denies high fever or productive cough, shortness of breath or significant GI symptoms.  Over-the-counter advil has been used.   I reviewed the patients updated PMH, FH, and SocHx.    Patient Active Problem List   Diagnosis Date Noted  . SVT (supraventricular tachycardia) ([place]) 05/28/2015  . PVC's (premature ventricular contractions) 05/28/2015  . BENIGN POSITIONAL VERTIGO 06/16/2009  . ALLERGIC RHINITIS 06/16/2009   No outpatient medications have been marked as taking for the 06/03/17 encounter (Office Visit) with Leamon Arnt, MD.    Review of Systems: Constitutional: Negative for fever malaise or anorexia HEENT: no facial or dental pain Cardiovascular: negative for chest pain Respiratory: negative for SOB or pleuritic chest pain Gastrointestinal: negative for abdominal pain  Objective  Vitals: BP 140/80 (BP Location: Left Arm, Patient Position: Sitting, Cuff Size: Normal)   Pulse 69   Temp 97.6 F (36.4 C) (Oral)   Ht 5\' 5"  (1.651 m)   Wt 133 lb (60.3 kg)   SpO2 94%   BMI 22.13 kg/m  General: no acute respiratory distress  Psych:  Alert and oriented, normal mood and affect HEENT: Normocephalic, nasal congestion present, TMs w/o erythema, OP with erythema w/o exudate, + cervical LAD, supple neck  Cardiovascular:  RRR without murmur or gallop. no peripheral edema Respiratory:  Good breath sounds bilaterally, CTAB with normal respiratory effort Skin:  Warm, no rashes Neurologic:   Mental status is normal. normal gait  Assessment  1. Viral upper respiratory tract  infection      Plan   URI, viral: discussed dx; no sign or sx of bacterial infection is present. Treat supportively with antihistamines, decongestants, and/or cough meds. See AVS for care instructions.   Follow up: Return if symptoms worsen or fail to improve.    Commons side effects, risks, benefits, and alternatives for medications and treatment plan prescribed today were discussed, and the patient expressed understanding of the given instructions. Patient is instructed to call or message via MyChart if he/she has any questions or concerns regarding our treatment plan. No barriers to understanding were identified. We discussed Red Flag symptoms and signs in detail. Patient expressed understanding regarding what to do in case of urgent or emergency type symptoms.   Medication list was reconciled, printed and provided to the patient in AVS. Patient instructions and summary information was reviewed with the patient as documented in the AVS. This note was prepared with assistance of Dragon voice recognition software. Occasional wrong-word or sound-a-like substitutions may have occurred due to the inherent limitations of voice recognition software  No orders of the defined types were placed in this encounter.  No orders of the defined types were placed in this encounter.

## 2017-06-03 NOTE — Patient Instructions (Signed)
Please follow up if symptoms do not improve or as needed.   Start taking Mucinex DM twice a day to help with the congestion and cough. You can continue using Advil for the discomfort.    Upper Respiratory Infection, Adult Most upper respiratory infections (URIs) are a viral infection of the air passages leading to the lungs. A URI affects the nose, throat, and upper air passages. The most common type of URI is nasopharyngitis and is typically referred to as "the common cold." URIs run their course and usually go away on their own. Most of the time, a URI does not require medical attention, but sometimes a bacterial infection in the upper airways can follow a viral infection. This is called a secondary infection. Sinus and middle ear infections are common types of secondary upper respiratory infections. Bacterial pneumonia can also complicate a URI. A URI can worsen asthma and chronic obstructive pulmonary disease (COPD). Sometimes, these complications can require emergency medical care and may be life threatening. What are the causes? Almost all URIs are caused by viruses. A virus is a type of germ and can spread from one person to another. What increases the risk? You may be at risk for a URI if:  You smoke.  You have chronic heart or lung disease.  You have a weakened defense (immune) system.  You are very young or very old.  You have nasal allergies or asthma.  You work in crowded or poorly ventilated areas.  You work in health care facilities or schools.  What are the signs or symptoms? Symptoms typically develop 2-3 days after you come in contact with a cold virus. Most viral URIs last 7-10 days. However, viral URIs from the influenza virus (flu virus) can last 14-18 days and are typically more severe. Symptoms may include:  Runny or stuffy (congested) nose.  Sneezing.  Cough.  Sore throat.  Headache.  Fatigue.  Fever.  Loss of appetite.  Pain in your forehead,  behind your eyes, and over your cheekbones (sinus pain).  Muscle aches.  How is this diagnosed? Your health care provider may diagnose a URI by:  Physical exam.  Tests to check that your symptoms are not due to another condition such as: ? Strep throat. ? Sinusitis. ? Pneumonia. ? Asthma.  How is this treated? A URI goes away on its own with time. It cannot be cured with medicines, but medicines may be prescribed or recommended to relieve symptoms. Medicines may help:  Reduce your fever.  Reduce your cough.  Relieve nasal congestion.  Follow these instructions at home:  Take medicines only as directed by your health care provider.  Gargle warm saltwater or take cough drops to comfort your throat as directed by your health care provider.  Use a warm mist humidifier or inhale steam from a shower to increase air moisture. This may make it easier to breathe.  Drink enough fluid to keep your urine clear or pale yellow.  Eat soups and other clear broths and maintain good nutrition.  Rest as needed.  Return to work when your temperature has returned to normal or as your health care provider advises. You may need to stay home longer to avoid infecting others. You can also use a face mask and careful hand washing to prevent spread of the virus.  Increase the usage of your inhaler if you have asthma.  Do not use any tobacco products, including cigarettes, chewing tobacco, or electronic cigarettes. If you need help quitting,  ask your health care provider. How is this prevented? The best way to protect yourself from getting a cold is to practice good hygiene.  Avoid oral or hand contact with people with cold symptoms.  Wash your hands often if contact occurs.  There is no clear evidence that vitamin C, vitamin E, echinacea, or exercise reduces the chance of developing a cold. However, it is always recommended to get plenty of rest, exercise, and practice good nutrition. Contact  a health care provider if:  You are getting worse rather than better.  Your symptoms are not controlled by medicine.  You have chills.  You have worsening shortness of breath.  You have brown or red mucus.  You have yellow or brown nasal discharge.  You have pain in your face, especially when you bend forward.  You have a fever.  You have swollen neck glands.  You have pain while swallowing.  You have white areas in the back of your throat. Get help right away if:  You have severe or persistent: ? Headache. ? Ear pain. ? Sinus pain. ? Chest pain.  You have chronic lung disease and any of the following: ? Wheezing. ? Prolonged cough. ? Coughing up blood. ? A change in your usual mucus.  You have a stiff neck.  You have changes in your: ? Vision. ? Hearing. ? Thinking. ? Mood. This information is not intended to replace advice given to you by your health care provider. Make sure you discuss any questions you have with your health care provider. Document Released: 10/05/2000 Document Revised: 12/13/2015 Document Reviewed: 07/17/2013 Elsevier Interactive Patient Education  [employer].

## 2017-06-14 ENCOUNTER — Encounter: Payer: Self-pay | Admitting: Family Medicine

## 2017-06-14 ENCOUNTER — Ambulatory Visit (INDEPENDENT_AMBULATORY_CARE_PROVIDER_SITE_OTHER): Payer: Medicare Other | Admitting: Family Medicine

## 2017-06-14 VITALS — BP 138/80 | HR 66 | Temp 97.8°F | Wt 130.8 lb

## 2017-06-14 DIAGNOSIS — J018 Other acute sinusitis: Secondary | ICD-10-CM | POA: Diagnosis not present

## 2017-06-14 MED ORDER — AMOXICILLIN-POT CLAVULANATE 875-125 MG PO TABS: 1.0000 | ORAL_TABLET | Freq: Two times a day (BID) | ORAL | 0 refills | Status: DC

## 2017-06-14 NOTE — Progress Notes (Signed)
   Subjective:    Patient ID: Mary Mercado, female    DOB: 10-14-1940, 77 y.o.   MRN: [id number]  HPI Here for 2 weeks of a dry cough with sinus congestion. No fever. Using Mucinex.    Review of Systems  Constitutional: Negative.   HENT: Positive for congestion, postnasal drip, sinus pressure and sinus pain. Negative for sore throat.   Eyes: Negative.   Respiratory: Positive for cough.        Objective:   Physical Exam  Constitutional: She appears well-developed and well-nourished.  HENT:  Right Ear: External ear normal.  Left Ear: External ear normal.  Nose: Nose normal.  Mouth/Throat: Oropharynx is clear and moist.  Eyes: Conjunctivae are normal.  Neck: No thyromegaly present.  Pulmonary/Chest: Effort normal and breath sounds normal. No respiratory distress. She has no wheezes. She has no rales.  Lymphadenopathy:    She has no cervical adenopathy.          Assessment & Plan:  Sinusitis, treat with Augmentin.  [name], MD

## 2017-10-27 DIAGNOSIS — H52203 Unspecified astigmatism, bilateral: Secondary | ICD-10-CM | POA: Diagnosis not present

## 2017-10-27 DIAGNOSIS — H524 Presbyopia: Secondary | ICD-10-CM | POA: Diagnosis not present

## 2017-10-27 DIAGNOSIS — H5213 Myopia, bilateral: Secondary | ICD-10-CM | POA: Diagnosis not present

## 2017-10-27 DIAGNOSIS — H2513 Age-related nuclear cataract, bilateral: Secondary | ICD-10-CM | POA: Diagnosis not present

## 2017-11-14 DIAGNOSIS — D1801 Hemangioma of skin and subcutaneous tissue: Secondary | ICD-10-CM | POA: Diagnosis not present

## 2017-11-14 DIAGNOSIS — Z85828 Personal history of other malignant neoplasm of skin: Secondary | ICD-10-CM | POA: Diagnosis not present

## 2017-11-14 DIAGNOSIS — L570 Actinic keratosis: Secondary | ICD-10-CM | POA: Diagnosis not present

## 2017-11-14 DIAGNOSIS — D225 Melanocytic nevi of trunk: Secondary | ICD-10-CM | POA: Diagnosis not present

## 2017-11-14 DIAGNOSIS — L820 Inflamed seborrheic keratosis: Secondary | ICD-10-CM | POA: Diagnosis not present

## 2017-11-27 DIAGNOSIS — H524 Presbyopia: Secondary | ICD-10-CM | POA: Diagnosis not present

## 2017-11-27 DIAGNOSIS — H5213 Myopia, bilateral: Secondary | ICD-10-CM | POA: Diagnosis not present

## 2017-11-27 DIAGNOSIS — H2513 Age-related nuclear cataract, bilateral: Secondary | ICD-10-CM | POA: Diagnosis not present

## 2018-01-01 DIAGNOSIS — H2513 Age-related nuclear cataract, bilateral: Secondary | ICD-10-CM | POA: Diagnosis not present

## 2018-01-10 DIAGNOSIS — Z9189 Other specified personal risk factors, not elsewhere classified: Secondary | ICD-10-CM | POA: Diagnosis not present

## 2018-01-10 DIAGNOSIS — F419 Anxiety disorder, unspecified: Secondary | ICD-10-CM | POA: Diagnosis not present

## 2018-01-10 DIAGNOSIS — H2512 Age-related nuclear cataract, left eye: Secondary | ICD-10-CM | POA: Diagnosis not present

## 2018-01-11 DIAGNOSIS — H2511 Age-related nuclear cataract, right eye: Secondary | ICD-10-CM | POA: Diagnosis not present

## 2018-01-11 DIAGNOSIS — Z9842 Cataract extraction status, left eye: Secondary | ICD-10-CM | POA: Diagnosis not present

## 2018-01-18 DIAGNOSIS — H2511 Age-related nuclear cataract, right eye: Secondary | ICD-10-CM | POA: Diagnosis not present

## 2018-01-18 DIAGNOSIS — Z9842 Cataract extraction status, left eye: Secondary | ICD-10-CM | POA: Diagnosis not present

## 2018-02-15 DIAGNOSIS — Z961 Presence of intraocular lens: Secondary | ICD-10-CM | POA: Diagnosis not present

## 2018-02-15 DIAGNOSIS — H5210 Myopia, unspecified eye: Secondary | ICD-10-CM | POA: Diagnosis not present

## 2018-02-15 DIAGNOSIS — H35363 Drusen (degenerative) of macula, bilateral: Secondary | ICD-10-CM | POA: Diagnosis not present

## 2018-02-15 DIAGNOSIS — H40003 Preglaucoma, unspecified, bilateral: Secondary | ICD-10-CM | POA: Diagnosis not present

## 2018-02-15 DIAGNOSIS — H2511 Age-related nuclear cataract, right eye: Secondary | ICD-10-CM | POA: Diagnosis not present

## 2018-02-15 DIAGNOSIS — Z48810 Encounter for surgical aftercare following surgery on the sense organs: Secondary | ICD-10-CM | POA: Diagnosis not present

## 2018-02-15 DIAGNOSIS — H524 Presbyopia: Secondary | ICD-10-CM | POA: Diagnosis not present

## 2018-02-15 DIAGNOSIS — Z9842 Cataract extraction status, left eye: Secondary | ICD-10-CM | POA: Diagnosis not present

## 2018-02-15 DIAGNOSIS — H5231 Anisometropia: Secondary | ICD-10-CM | POA: Diagnosis not present

## 2018-05-08 DIAGNOSIS — Z885 Allergy status to narcotic agent status: Secondary | ICD-10-CM | POA: Diagnosis not present

## 2018-05-08 DIAGNOSIS — H40003 Preglaucoma, unspecified, bilateral: Secondary | ICD-10-CM | POA: Diagnosis not present

## 2018-05-08 DIAGNOSIS — H40031 Anatomical narrow angle, right eye: Secondary | ICD-10-CM | POA: Diagnosis not present

## 2018-05-08 DIAGNOSIS — Z961 Presence of intraocular lens: Secondary | ICD-10-CM | POA: Diagnosis not present

## 2018-05-08 DIAGNOSIS — Z9842 Cataract extraction status, left eye: Secondary | ICD-10-CM | POA: Diagnosis not present

## 2018-05-08 DIAGNOSIS — H5231 Anisometropia: Secondary | ICD-10-CM | POA: Diagnosis not present

## 2018-05-08 DIAGNOSIS — H2511 Age-related nuclear cataract, right eye: Secondary | ICD-10-CM | POA: Diagnosis not present

## 2018-05-31 ENCOUNTER — Emergency Department (HOSPITAL_COMMUNITY): Payer: Medicare Other

## 2018-05-31 ENCOUNTER — Inpatient Hospital Stay (HOSPITAL_COMMUNITY)
Admission: EM | Admit: 2018-05-31 | Discharge: 2018-06-05 | DRG: 061 | Disposition: A | Discharge status: Rehabilitation Facility | Payer: Medicare Other | Attending: Neurology | Admitting: Neurology

## 2018-05-31 ENCOUNTER — Inpatient Hospital Stay (HOSPITAL_COMMUNITY): Payer: Medicare Other

## 2018-05-31 ENCOUNTER — Encounter (HOSPITAL_COMMUNITY): Payer: Self-pay | Admitting: Emergency Medicine

## 2018-05-31 DIAGNOSIS — Z800 Family history of malignant neoplasm of digestive organs: Secondary | ICD-10-CM | POA: Diagnosis not present

## 2018-05-31 DIAGNOSIS — I69398 Other sequelae of cerebral infarction: Secondary | ICD-10-CM | POA: Diagnosis not present

## 2018-05-31 DIAGNOSIS — Z7982 Long term (current) use of aspirin: Secondary | ICD-10-CM

## 2018-05-31 DIAGNOSIS — I639 Cerebral infarction, unspecified: Secondary | ICD-10-CM | POA: Diagnosis not present

## 2018-05-31 DIAGNOSIS — I672 Cerebral atherosclerosis: Secondary | ICD-10-CM | POA: Diagnosis not present

## 2018-05-31 DIAGNOSIS — R002 Palpitations: Secondary | ICD-10-CM | POA: Diagnosis not present

## 2018-05-31 DIAGNOSIS — Z8249 Family history of ischemic heart disease and other diseases of the circulatory system: Secondary | ICD-10-CM

## 2018-05-31 DIAGNOSIS — R209 Unspecified disturbances of skin sensation: Secondary | ICD-10-CM | POA: Diagnosis not present

## 2018-05-31 DIAGNOSIS — I619 Nontraumatic intracerebral hemorrhage, unspecified: Secondary | ICD-10-CM | POA: Diagnosis present

## 2018-05-31 DIAGNOSIS — R0902 Hypoxemia: Secondary | ICD-10-CM | POA: Diagnosis not present

## 2018-05-31 DIAGNOSIS — S40022A Contusion of left upper arm, initial encounter: Secondary | ICD-10-CM | POA: Diagnosis present

## 2018-05-31 DIAGNOSIS — E785 Hyperlipidemia, unspecified: Secondary | ICD-10-CM | POA: Diagnosis not present

## 2018-05-31 DIAGNOSIS — I361 Nonrheumatic tricuspid (valve) insufficiency: Secondary | ICD-10-CM | POA: Diagnosis not present

## 2018-05-31 DIAGNOSIS — R29704 NIHSS score 4: Secondary | ICD-10-CM | POA: Diagnosis present

## 2018-05-31 DIAGNOSIS — I63511 Cerebral infarction due to unspecified occlusion or stenosis of right middle cerebral artery: Secondary | ICD-10-CM | POA: Diagnosis not present

## 2018-05-31 DIAGNOSIS — I700 Atherosclerosis of aorta: Secondary | ICD-10-CM | POA: Diagnosis not present

## 2018-05-31 DIAGNOSIS — R7303 Prediabetes: Secondary | ICD-10-CM | POA: Diagnosis present

## 2018-05-31 DIAGNOSIS — R110 Nausea: Secondary | ICD-10-CM | POA: Diagnosis not present

## 2018-05-31 DIAGNOSIS — I071 Rheumatic tricuspid insufficiency: Secondary | ICD-10-CM | POA: Diagnosis present

## 2018-05-31 DIAGNOSIS — Y92239 Unspecified place in hospital as the place of occurrence of the external cause: Secondary | ICD-10-CM | POA: Diagnosis present

## 2018-05-31 DIAGNOSIS — I69312 Visuospatial deficit and spatial neglect following cerebral infarction: Secondary | ICD-10-CM | POA: Diagnosis not present

## 2018-05-31 DIAGNOSIS — I10 Essential (primary) hypertension: Secondary | ICD-10-CM

## 2018-05-31 DIAGNOSIS — I63411 Cerebral infarction due to embolism of right middle cerebral artery: Secondary | ICD-10-CM | POA: Diagnosis not present

## 2018-05-31 DIAGNOSIS — I493 Ventricular premature depolarization: Secondary | ICD-10-CM | POA: Diagnosis present

## 2018-05-31 DIAGNOSIS — I6523 Occlusion and stenosis of bilateral carotid arteries: Secondary | ICD-10-CM | POA: Diagnosis not present

## 2018-05-31 DIAGNOSIS — R414 Neurologic neglect syndrome: Secondary | ICD-10-CM | POA: Diagnosis not present

## 2018-05-31 DIAGNOSIS — R51 Headache: Secondary | ICD-10-CM

## 2018-05-31 DIAGNOSIS — R259 Unspecified abnormal involuntary movements: Secondary | ICD-10-CM | POA: Diagnosis not present

## 2018-05-31 DIAGNOSIS — R208 Other disturbances of skin sensation: Secondary | ICD-10-CM | POA: Diagnosis not present

## 2018-05-31 DIAGNOSIS — R42 Dizziness and giddiness: Secondary | ICD-10-CM | POA: Diagnosis not present

## 2018-05-31 DIAGNOSIS — Z888 Allergy status to other drugs, medicaments and biological substances status: Secondary | ICD-10-CM | POA: Diagnosis not present

## 2018-05-31 DIAGNOSIS — Z833 Family history of diabetes mellitus: Secondary | ICD-10-CM

## 2018-05-31 DIAGNOSIS — I340 Nonrheumatic mitral (valve) insufficiency: Secondary | ICD-10-CM | POA: Diagnosis not present

## 2018-05-31 DIAGNOSIS — Y848 Other medical procedures as the cause of abnormal reaction of the patient, or of later complication, without mention of misadventure at the time of the procedure: Secondary | ICD-10-CM | POA: Diagnosis present

## 2018-05-31 DIAGNOSIS — R519 Headache, unspecified: Secondary | ICD-10-CM

## 2018-05-31 DIAGNOSIS — G8194 Hemiplegia, unspecified affecting left nondominant side: Secondary | ICD-10-CM | POA: Diagnosis present

## 2018-05-31 DIAGNOSIS — L821 Other seborrheic keratosis: Secondary | ICD-10-CM | POA: Diagnosis present

## 2018-05-31 DIAGNOSIS — I69354 Hemiplegia and hemiparesis following cerebral infarction affecting left non-dominant side: Secondary | ICD-10-CM | POA: Diagnosis not present

## 2018-05-31 DIAGNOSIS — R29818 Other symptoms and signs involving the nervous system: Secondary | ICD-10-CM | POA: Diagnosis not present

## 2018-05-31 DIAGNOSIS — I6300 Cerebral infarction due to thrombosis of unspecified precerebral artery: Secondary | ICD-10-CM | POA: Diagnosis not present

## 2018-05-31 LAB — URINALYSIS, ROUTINE W REFLEX MICROSCOPIC
Bilirubin Urine: NEGATIVE
Glucose, UA: NEGATIVE mg/dL
Ketones, ur: 5 mg/dL — AB
Nitrite: NEGATIVE
Protein, ur: NEGATIVE mg/dL
Specific Gravity, Urine: 1.025 (ref 1.005–1.030)
pH: 7 (ref 5.0–8.0)

## 2018-05-31 LAB — CBC
HCT: 45.1 % (ref 36.0–46.0)
Hemoglobin: 14.5 g/dL (ref 12.0–15.0)
MCH: 29.1 pg (ref 26.0–34.0)
MCHC: 32.2 g/dL (ref 30.0–36.0)
MCV: 90.6 fL (ref 80.0–100.0)
Platelets: 236 10*3/uL (ref 150–400)
RBC: 4.98 MIL/uL (ref 3.87–5.11)
RDW: 12.9 % (ref 11.5–15.5)
WBC: 6.1 10*3/uL (ref 4.0–10.5)
nRBC: 0 % (ref 0.0–0.2)

## 2018-05-31 LAB — DIFFERENTIAL
Abs Immature Granulocytes: 0.02 10*3/uL (ref 0.00–0.07)
Basophils Absolute: 0 10*3/uL (ref 0.0–0.1)
Basophils Relative: 1 %
Eosinophils Absolute: 0.1 10*3/uL (ref 0.0–0.5)
Eosinophils Relative: 1 %
Immature Granulocytes: 0 %
Lymphocytes Relative: 22 %
Lymphs Abs: 1.3 10*3/uL (ref 0.7–4.0)
Monocytes Absolute: 0.6 10*3/uL (ref 0.1–1.0)
Monocytes Relative: 9 %
Neutro Abs: 4.1 10*3/uL (ref 1.7–7.7)
Neutrophils Relative %: 67 %

## 2018-05-31 LAB — MRSA PCR SCREENING: MRSA by PCR: NEGATIVE

## 2018-05-31 LAB — APTT: aPTT: 24 seconds (ref 24–36)

## 2018-05-31 LAB — COMPREHENSIVE METABOLIC PANEL
ALT: 15 U/L (ref 0–44)
AST: 21 U/L (ref 15–41)
Albumin: 4.1 g/dL (ref 3.5–5.0)
Alkaline Phosphatase: 59 U/L (ref 38–126)
Anion gap: 14 (ref 5–15)
BUN: 13 mg/dL (ref 8–23)
CO2: 26 mmol/L (ref 22–32)
Calcium: 9.5 mg/dL (ref 8.9–10.3)
Chloride: 99 mmol/L (ref 98–111)
Creatinine, Ser: 0.74 mg/dL (ref 0.44–1.00)
GFR calc Af Amer: >60 mL/min (ref >60)
GFR calc non Af Amer: >60 mL/min (ref >60)
Glucose, Bld: 123 mg/dL — ABNORMAL HIGH (ref 70–99)
Potassium: 4.6 mmol/L (ref 3.5–5.1)
Sodium: 139 mmol/L (ref 135–145)
Total Bilirubin: 0.8 mg/dL (ref 0.3–1.2)
Total Protein: 7.1 g/dL (ref 6.5–8.1)

## 2018-05-31 LAB — RAPID URINE DRUG SCREEN, HOSP PERFORMED
Amphetamines: NOT DETECTED
Barbiturates: NOT DETECTED
Benzodiazepines: NOT DETECTED
Cocaine: NOT DETECTED
Opiates: NOT DETECTED
Tetrahydrocannabinol: NOT DETECTED

## 2018-05-31 LAB — PROTIME-INR
INR: 0.95
Prothrombin Time: 12.6 seconds (ref 11.4–15.2)

## 2018-05-31 LAB — ETHANOL: Alcohol, Ethyl (B): <10 mg/dL (ref <10)

## 2018-05-31 MED ORDER — PANTOPRAZOLE SODIUM 40 MG IV SOLR
40.0000 mg | Freq: Every day | INTRAVENOUS | Status: DC
  Administered 2018-06-01 (×2): 40 mg via INTRAVENOUS
  Filled 2018-05-31 (×2): qty 40

## 2018-05-31 MED ORDER — ONDANSETRON HCL 4 MG/2ML IJ SOLN
4.0000 mg | Freq: Four times a day (QID) | INTRAMUSCULAR | Status: DC | PRN
  Administered 2018-05-31: 4 mg via INTRAVENOUS

## 2018-05-31 MED ORDER — NICARDIPINE HCL IN NACL 20-0.86 MG/200ML-% IV SOLN
0.0000 mg/h | INTRAVENOUS | Status: DC
  Filled 2018-05-31: qty 200

## 2018-05-31 MED ORDER — IOPAMIDOL (ISOVUE-370) INJECTION 76%
75.0000 mL | Freq: Once | INTRAVENOUS | Status: AC | PRN
  Administered 2018-05-31: 75 mL via INTRAVENOUS

## 2018-05-31 MED ORDER — ONDANSETRON HCL 4 MG/2ML IJ SOLN
INTRAMUSCULAR | Status: AC
  Filled 2018-05-31: qty 2

## 2018-05-31 MED ORDER — SENNOSIDES-DOCUSATE SODIUM 8.6-50 MG PO TABS: 1.0000 | ORAL_TABLET | Freq: Every evening | ORAL | Status: DC | PRN

## 2018-05-31 MED ORDER — ONDANSETRON HCL 4 MG/2ML IJ SOLN
4.0000 mg | Freq: Once | INTRAMUSCULAR | Status: AC
  Administered 2018-05-31: 4 mg via INTRAVENOUS

## 2018-05-31 MED ORDER — ALTEPLASE (STROKE) FULL DOSE INFUSION
0.9000 mg/kg | Freq: Once | INTRAVENOUS | Status: AC
  Administered 2018-05-31: 58.6 mg via INTRAVENOUS
  Filled 2018-05-31: qty 100

## 2018-05-31 MED ORDER — SODIUM CHLORIDE 0.9 % IV SOLN
INTRAVENOUS | Status: DC
  Administered 2018-05-31 – 2018-06-05 (×7): via INTRAVENOUS

## 2018-05-31 MED ORDER — STROKE: EARLY STAGES OF RECOVERY BOOK
Freq: Once | Status: AC
  Administered 2018-05-31: 15:00:00
  Filled 2018-05-31: qty 1

## 2018-05-31 NOTE — ED Notes (Signed)
TPA started at [facility]

## 2018-05-31 NOTE — ED Provider Notes (Signed)
[facility] EMERGENCY DEPARTMENT Provider Note   CSN: [id number] Arrival date & time: 05/31/18  1143     History   Chief Complaint Chief Complaint  Patient presents with  . Dizziness  . Numbness    HPI Mary Mercado is a 78 y.o. female who presents the emergency department with chief complaint of abnormal sensation of the left upper and lower extremity.  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  Suddenly at 1015 while seated she felt "very strange and dizzy."  She got up and walked to the couch and sat down and noticed that her left arm "just dropped."  She got up to walk to her husband while her arm remained limp for several minutes and told him that she felt like something was very wrong.  She asked to have some ice which she took.  Her symptoms were not improving so they called 911.  Patient continues to feel somewhat dizzy but denies vertigo which she has a history of.  She continues to feel that the left side of her arm and leg feel very strange.  She denies headache.  She has no significant past medical history.  She takes a baby aspirin daily for history of palpitations which she had a cardiac work-up and found no abnormalities.  She takes no other medications.  She has no history of smoking.  She denies visual disturbances, room spinning, chest pain, shortness of breath.  She has no history of melena, GI bleeds.  HPI  Past Medical History:  Diagnosis Date  . Allergy   . Vertigo, benign positional     Patient Active Problem List   Diagnosis Date Noted  . SVT (supraventricular tachycardia) ([place]) 05/28/2015  . PVC's (premature ventricular contractions) 05/28/2015  . BENIGN POSITIONAL VERTIGO 06/16/2009  . ALLERGIC RHINITIS 06/16/2009    Past Surgical History:  Procedure Laterality Date  . Hemilaminectomy and microdiskectomy  at L4-5 on the left.  12/10/2003     OB History   No obstetric history on file.      Home Medications    Prior to Admission medications   Medication Sig Start Date End Date Taking? Authorizing Provider  amoxicillin-clavulanate (AUGMENTIN) 875-125 MG tablet Take 1 tablet by mouth 2 (two) times daily. 06/14/17   [name], MD  aspirin 81 MG tablet Take 81 mg by mouth daily.    [provider]    Family History Family History  Problem Relation Age of Onset  . Colon cancer Father   . Diabetes Mother   . Heart attack Mother        80  . Heart attack Brother 65    Social History Social History   Tobacco Use  . Smoking status: Never Smoker  . Smokeless tobacco: Never Used  Substance Use Topics  . Alcohol use: No  . Drug use: No     Allergies   Acetaminophen   Review of Systems Review of Systems Ten systems reviewed and are negative for acute change, except as noted in the HPI.    Physical Exam Updated Vital Signs BP (!) 174/107   Pulse 68   Resp 14   Wt 65.1 kg   SpO2 100%   BMI 23.90 kg/m   Physical Exam Vitals signs and nursing note reviewed.  Constitutional:      General: She is not  in acute distress.    Appearance: She is well-developed. She is not diaphoretic.  HENT:     Head: Normocephalic and atraumatic.  Eyes:     General: No visual field deficit or scleral icterus.    Extraocular Movements: Extraocular movements intact.     Conjunctiva/sclera: Conjunctivae normal.     Pupils: Pupils are equal, round, and reactive to light.  Neck:     Musculoskeletal: Normal range of motion.  Cardiovascular:     Rate and Rhythm: Normal rate and regular rhythm.     Heart sounds: Normal heart sounds. No murmur. No friction rub. No gallop.   Pulmonary:     Effort: Pulmonary effort is normal. No respiratory distress.     Breath sounds: Normal breath sounds.  Abdominal:     General: Bowel sounds are normal. There is no distension.      Palpations: Abdomen is soft. There is no mass.     Tenderness: There is no abdominal tenderness. There is no guarding.  Skin:    General: Skin is warm and dry.  Neurological:     Mental Status: She is alert and oriented to person, place, and time.     GCS: GCS eye subscore is 4. GCS verbal subscore is 5. GCS motor subscore is 6.     Cranial Nerves: No cranial nerve deficit, dysarthria or facial asymmetry.     Sensory: Sensory deficit present.     Motor: No weakness, tremor or pronator drift.     Deep Tendon Reflexes: Reflexes normal.     Comments: Patient has total lack of sensation to the left upper and lower extremity with light and deep pressure most representative of neglect. She is able to move the limb and follow commands when directed to pay attention to the left side.  She has an otherwise normal neurologic examination.  Psychiatric:        Behavior: Behavior normal.      ED Treatments / Results  Labs (all labs ordered are listed, but only abnormal results are displayed) Labs Reviewed  PROTIME-INR  APTT  CBC  DIFFERENTIAL  ETHANOL  COMPREHENSIVE METABOLIC PANEL  RAPID URINE DRUG SCREEN, HOSP PERFORMED  URINALYSIS, ROUTINE W REFLEX MICROSCOPIC    EKG EKG Interpretation  Date/Time:  Thursday May 31 2018 11:56:30 EST Ventricular Rate:  67 PR Interval:    QRS Duration: 132 QT Interval:  424 QTC Calculation: 448 R Axis:   80 Text Interpretation:  Sinus rhythm Short PR interval Right bundle branch block Anteroseptal infarct, old Nonspecific repol abnormality, lateral leads Since last tracing rate slower 10 Dec 2003 Confirmed by Rolland Porter [id number]) on 06/01/2018 1:27:42 PM   Radiology Ct Head Code Stroke Wo Contrast  Result Date: 05/31/2018 CLINICAL DATA:  Code stroke. Focal neurological deficit. Clinical finding not specified. EXAM: CT HEAD WITHOUT CONTRAST TECHNIQUE: Contiguous axial images were obtained from the base of the skull through the vertex without  intravenous contrast. COMPARISON:  None. FINDINGS: Brain: There are chronic small-vessel ischemic changes affecting the pons and the cerebral hemispheric white matter. There is no CT evidence of acute infarction, mass lesion, hemorrhage, hydrocephalus or extra-axial collection. Vascular: There is atherosclerotic calcification of the major vessels at the base of the brain. Skull: Negative Sinuses/Orbits: Clear/normal Other: None ASPECTS (Adrian Stroke Program Early CT Score) - Ganglionic level infarction (caudate, lentiform nuclei, internal capsule, insula, M1-M3 cortex): 7 - Supraganglionic infarction (M4-M6 cortex): 3 Total score (0-10 with 10 being normal): 10 IMPRESSION: 1. No acute  finding by CT. 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 2. ASPECTS is 10. * These results were communicated to Dr. Cheral Marker at 12:28 pmon 2/6/2020by text page via the [facility] messaging system. Electronically Signed   By: Nelson Chimes M.D.   On: 05/31/2018 12:29    Procedures .Critical Care Performed by: Margarita Mail, PA-C Authorized by: Margarita Mail, PA-C   Critical care provider statement:    Critical care time (minutes):  50   Critical care was necessary to treat or prevent imminent or life-threatening deterioration of the following conditions:  CNS failure or compromise   Critical care was time spent personally by me on the following activities:  Development of treatment plan with patient or surrogate, discussions with consultants, evaluation of patient's response to treatment, examination of patient, obtaining history from patient or surrogate, ordering and performing treatments and interventions, ordering and review of laboratory studies, ordering and review of radiographic studies and re-evaluation of patient's condition   (including critical care time)  Medications Ordered in ED Medications  ondansetron (ZOFRAN) 4 MG/2ML injection (has no administration in time range)  ondansetron  (ZOFRAN) injection 4 mg (4 mg Intravenous Given 05/31/18 1149)  iopamidol (ISOVUE-370) 76 % injection 75 mL (75 mLs Intravenous Contrast Given 05/31/18 1238)     Initial Impression / Assessment and Plan / ED Course  I have reviewed the triage vital signs and the nursing notes.  Pertinent labs & imaging results that were available during my care of the patient were reviewed by me and considered in my medical decision making (see chart for details).     Seen as a code stroke.  She has left-sided hemineglect Patient images shows no stroke and patient was ultimately given TPA. Patient will be admitted to the neuro ICU.  Urine appears contaminated.  Her CMP shows mildly elevated blood glucose which may be acute phase reaction.  EKG shows no arrhythmias  Final Clinical Impressions(s) / ED Diagnoses   Final diagnoses:  Headache in front of head    ED Discharge Orders    None       Margarita Mail, PA-C 06/04/18 2333    Drenda Freeze, MD 06/06/18 [phone]

## 2018-05-31 NOTE — ED Triage Notes (Signed)
Pt arrives by [facility]- pt called ems due to feeling dizzy at 1015 with left hand and left leg numbness. Pt has no extremity weakness or drift. Speech clear.  Pt is alert and ox4. Pt vomiting on arrival from ems.

## 2018-05-31 NOTE — ED Notes (Addendum)
Pt taken to CT by Lucina Mellow, Neurologist and PA. Code stroke activated at 1200

## 2018-05-31 NOTE — Progress Notes (Signed)
Pharmacist Code Stroke Response  Notified to mix tPA at 1301 by Dr. Cheral Marker Delivered tPA to RN at 1303  tPA dose = 5.9mg  bolus over 1 minute followed by 52.7mg  for a total dose of 58.6mg  over 1 hour  Issues/delays encountered (if applicable): Extended discussion between patient/husband regarding TPA decision  Mary Mercado, PharmD, BCPS Please check AMION for all [facility] contact numbers Clinical Pharmacist 05/31/2018 1:11 PM

## 2018-05-31 NOTE — ED Notes (Signed)
MD lindzen already in ED when code stroke was being paged and MD came into room to re ask questions and do exam prior to going to CT scan.

## 2018-05-31 NOTE — Code Documentation (Signed)
78 year old female presents to [facility] via [place] at 44 with reports of left arm/leg numbness and dizziness which started at 1015.  Code stroke was called in the ED at 1159.  Dr. Cheral Marker at bedside in ED - met in CT scan by the stroke team.  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   Her husband reports she walked into the room without trouble but her left arm was not moving well.  In CT scan on exam she is moving her left side without problem - has good strength and follows commands but has no sensation to left arm and left leg.  She has sensation to left side of her face but extincts with simultanoues touch. Nausea treated with Zofran 4 mg IV x2.   CT scan without is negative for bleed.  Her NIHHS is 4.  CTA completed.  Patient and husband discussing tPA with Dr. Cheral Marker.  Difficulty making treatment decision by the patient.  At 1301 patient decided to treat with tPA.  tPA started at 1306.  BP remained stable as patient relaxed after decision making to treat.  Handoff to [employer] - to call as needed.

## 2018-05-31 NOTE — Consult Note (Signed)
Neurology Consultation  Reason for Consult: Stroke   Referring Physician: Dr. Darl Householder  CC: Left sided decreased sensation  History is obtained from:husband  HPI: Mary Mercado is a 78 y.o. female with history of vertigo.  Apparently today patient came to her husband at approximately 1015 and noted that her left arm felt weak and she had decreased sensation in her left arm and left leg.  He told her to sit on the bed as she stated she was feeling dizzy.  She did have one episode of emesis.  She was brought to the emergency department where she continued to have decreased sensation in her left arm left leg and left face.  She continued to have some nausea. She was brought to CT.  She was very anxious at that time.  While in CT she did sit up and have some emesis.  CT of head did not show any blood and CTA was obtained.  CTA did not show any large vessel occlusion.  At that time the decision to give TPA was offered to wife and husband.  Wife and husband could not make a quick decision and talked it over for approximately 30-40 minutes.  Patient finally decided to receive TPA at that time, while still in the tPA time window.  Patient needed Cleviprex as her blood pressure was elevated.   Patient denies any chest pain, difficulty swallowing, difficulty with vision, difficulty with understanding or expressing herself, urinating or defecating, weakness, stomach pain.   LKW: 10:15 05/31/2018 tpa given?:  Yes, however there was significant delay due to husband and wife taking time to make final informed decision after full discussion of risks/benefits.  Delay was approximately 30-40 minutes Premorbid modified Rankin scale (mRS): 0 NIHSS 4 sensation, neglect, ataxia   ROS: A 14 point ROS was performed and is negative except as noted in the HPI.   Past Medical History:  Diagnosis Date  . Allergy   . Vertigo, benign positional    Family History  Problem Relation Age of Onset  . Colon cancer Father   .  Diabetes Mother   . Heart attack Mother        93  . Heart attack Brother 63    Social History:   reports that she has never smoked. She has never used smokeless tobacco. She reports that she does not drink alcohol or use drugs.  Medications  Current Facility-Administered Medications:  .  ondansetron (ZOFRAN) 4 MG/2ML injection, , , ,   Current Outpatient Medications:  .  amoxicillin-clavulanate (AUGMENTIN) 875-125 MG tablet, Take 1 tablet by mouth 2 (two) times daily., Disp: 20 tablet, Rfl: 0 .  aspirin 81 MG tablet, Take 81 mg by mouth daily., Disp: , Rfl:   Exam: Current vital signs: BP (!) 174/107   Pulse 68   Resp 14   Wt 65.1 kg   SpO2 100%   BMI 23.90 kg/m  Vital signs in last 24 hours: Pulse Rate:  [68-70] 68 (02/06 1200) Resp:  [14] 14 (02/06 1200) BP: (174)/(107) 174/107 (02/06 1153) SpO2:  [98 %-100 %] 100 % (02/06 1200) Weight:  [65.1 kg] 65.1 kg (02/06 1200)  Physical Exam  Constitutional: Appears well-developed and well-nourished.  Psych: Affect appropriate to situation Eyes: No scleral injection HENT: No OP obstrucion Head: Normocephalic.  Cardiovascular: Normal rate and regular rhythm.  Respiratory: Effort normal, non-labored breathing GI: Soft.  No distension. There is no tenderness.  Skin: WDI  Neuro: Mental Status: Patient is awake, alert, oriented  to person, place, month, year, and situation. Patient is able to give a clear and coherent history. No signs of aphasia or neglect Cranial Nerves: II: Visual Fields are full.  III,IV, VI: EOMI without ptosis or diploplia. Pupils are equal, round, and reactive to light.   V: Facial sensation decreaed to to temperature on left VII: Facial movement is symmetric.  VIII: hearing is intact to voice X: Palate elevates symmetrically XI: Shoulder shrug is symmetric. XII: tongue is midline   Motor: Left deltoid and tricep 4+/5; otherwise 5/5 Initially had intermittent involuntary choreoform movements  of LUE, which subsided after CTA Sensory: Complete loss of temperature, FT and deep pressure sensation to LUE and LLE Deep Tendon Reflexes: 2+ and symmetric in the biceps and patellae bilaterally.  Plantars: Toes are downgoing bilaterally.  Cerebellar: FNF and HKS are intact bilaterally  Labs I have reviewed labs in epic and the results pertinent to this consultation are:   CBC    Component Value Date/Time   WBC 6.1 05/31/2018 1205   RBC 4.98 05/31/2018 1205   HGB 14.5 05/31/2018 1205   HCT 45.1 05/31/2018 1205   PLT 236 05/31/2018 1205   MCV 90.6 05/31/2018 1205   MCH 29.1 05/31/2018 1205   MCHC 32.2 05/31/2018 1205   RDW 12.9 05/31/2018 1205   LYMPHSABS 1.3 05/31/2018 1205   MONOABS 0.6 05/31/2018 1205   EOSABS 0.1 05/31/2018 1205   BASOSABS 0.0 05/31/2018 1205    CMP     Component Value Date/Time   NA 144 05/05/2015 1615   K 5.6 (H) 05/05/2015 1615   CL 102 05/05/2015 1615   CO2 33 (H) 05/05/2015 1615   GLUCOSE 96 05/05/2015 1615   BUN 13 05/05/2015 1615   CREATININE 0.68 05/05/2015 1615   CALCIUM 10.2 05/05/2015 1615   PROT 7.1 05/05/2015 1615   ALBUMIN 4.8 05/05/2015 1615   AST 18 05/05/2015 1615   ALT 14 05/05/2015 1615   ALKPHOS 65 05/05/2015 1615   BILITOT 0.6 05/05/2015 1615   GFRNONAA 90 (L) 02/01/2011 1025   GFRAA >90 02/01/2011 1025    Lipid Panel  No results found for: CHOL, TRIG, HDL, CHOLHDL, VLDL, LDLCALC, LDLDIRECT   Imaging I have reviewed the images obtained:  CT-scan of the brain--No acute finding by CT. 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  CTA head and neck-- No large vessel occlusion. Intracranial atherosclerosis including severe right P2 and mild to moderate bilateral ICA stenosis.  Severe left vertebral artery origin stenosis.  Widely patent cervical carotid arteries.  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  Patient did receive TPA approximately 1306. 1. CT head without hemorrhage 2. CTA head and neck negative for LVO. There is intracranial atherosclerosis including severe right P2 and mild to moderate bilateral ICA stenoses.  Severe left vertebral artery origin stenosis.  Widely patent cervical carotid arteries. 3. DDx cardioembolic versus hypertensive microvascular disease versus in situ thrombosis 4. Stroke risk factors: None other than age 23. Classifiable as having failed ASA monotherapy  Recommendations:  -Admit to: ICU -Benefits of statin most likely outweighed by risks given patient's advanced age. If starting, would initiate at a low dose and obtain a baseline CK level -Hold antiplatelet agent until 24 hour post-tPA neuroimaging is stable and without evidence of bleeding. Given  that she failed ASA monotherapy and does not have known CAD, switching to Plavix would be indicated -Blood pressure control, goal of SYS < 180 -MRI/ECHO/A1C/Lipid panel. -Hyperglycemia management per SSI to maintain glucose 140-180mg /dL. -PT/OT/ST therapies and recommendations when able -Cardiac telemetry -Close neuro monitoring -Diet: NPO until cleared by speech -Code Status: Full Code   80 minutes spent in the emergent neurological evaluation and management of this critically ill acute stroke patient. Time spent including counseling patient and husband periodically while they discussed whether or not to consent to tPA.   I have examined and interviewed the patient and her husband. My examination findings were observed and documented in the note by Etta Quill, NP.  Electronically signed: Dr. Kerney Elbe

## 2018-06-01 ENCOUNTER — Inpatient Hospital Stay (HOSPITAL_COMMUNITY): Payer: Medicare Other

## 2018-06-01 ENCOUNTER — Ambulatory Visit (HOSPITAL_COMMUNITY): Payer: Medicare Other

## 2018-06-01 DIAGNOSIS — I340 Nonrheumatic mitral (valve) insufficiency: Secondary | ICD-10-CM

## 2018-06-01 DIAGNOSIS — I361 Nonrheumatic tricuspid (valve) insufficiency: Secondary | ICD-10-CM

## 2018-06-01 DIAGNOSIS — I6300 Cerebral infarction due to thrombosis of unspecified precerebral artery: Secondary | ICD-10-CM

## 2018-06-01 LAB — LIPID PANEL
Cholesterol: 243 mg/dL — ABNORMAL HIGH (ref 0–200)
HDL: 110 mg/dL (ref >40)
LDL Cholesterol: 117 mg/dL — ABNORMAL HIGH (ref 0–99)
Total CHOL/HDL Ratio: 2.2 RATIO
Triglycerides: 80 mg/dL (ref <150)
VLDL: 16 mg/dL (ref 0–40)

## 2018-06-01 LAB — ECHOCARDIOGRAM COMPLETE
Height: 65 in
Weight: 2297.6 oz

## 2018-06-01 LAB — HEMOGLOBIN A1C
Hgb A1c MFr Bld: 5.9 % — ABNORMAL HIGH (ref 4.8–5.6)
Mean Plasma Glucose: 122.63 mg/dL

## 2018-06-01 MED ORDER — METOPROLOL TARTRATE 25 MG PO TABS
25.0000 mg | ORAL_TABLET | Freq: Once | ORAL | Status: DC
  Filled 2018-06-01: qty 1

## 2018-06-01 MED ORDER — ATORVASTATIN CALCIUM 40 MG PO TABS
40.0000 mg | ORAL_TABLET | Freq: Every day | ORAL | Status: DC
  Administered 2018-06-01 – 2018-06-04 (×4): 40 mg via ORAL
  Filled 2018-06-01 (×4): qty 1

## 2018-06-01 MED ORDER — PANTOPRAZOLE SODIUM 40 MG PO TBEC
40.0000 mg | DELAYED_RELEASE_TABLET | Freq: Every day | ORAL | Status: DC
  Administered 2018-06-02 – 2018-06-05 (×4): 40 mg via ORAL
  Filled 2018-06-01 (×4): qty 1

## 2018-06-01 MED ORDER — ASPIRIN EC 81 MG PO TBEC
81.0000 mg | DELAYED_RELEASE_TABLET | Freq: Every day | ORAL | Status: DC
  Administered 2018-06-01 – 2018-06-05 (×5): 81 mg via ORAL
  Filled 2018-06-01 (×5): qty 1

## 2018-06-01 NOTE — Progress Notes (Addendum)
STROKE TEAM PROGRESS NOTE   HISTORY OF PRESENT ILLNESS (per record) Mary Mercado is a 78 y.o. female with history of vertigo. She self reports a sudden change in her left arm that she describes as "My arm just fell in my lap". She has distal hand weakness as well as decreased sensation in her left arm and left leg. She also describes ballistic left arm movents at the onset. She was brought to the emergency department where she started having N/V as well.  CT of head did not show any blood and CTA was obtained.  CTA did not show any large vessel occlusion. She received TPA.  Patient needed Cleviprex as her blood pressure was elevated.   SUBJECTIVE (INTERVAL HISTORY) Her husband is at the bedside. She still has left arm/leg numbness, but has better left arm control and increased in strength. Still with N/V. Stroke wk up underway. 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 and then subsequently had left hemiplegia with sensory loss   OBJECTIVE Vitals:   06/01/18 0900 06/01/18 0905 06/01/18 1000 06/01/18 1100  BP:  (!) 156/84 (!) 146/68 (!) 138/91  Pulse: 71 71  76  Resp: (!) 24 (!) 24 (!) 21 (!) 24  Temp:    99.5 F (37.5 C)  TempSrc:    Axillary  SpO2:  95%  (!) 82%  Weight:      Height:        CBC:  Recent Labs  Lab 05/31/18 1205  WBC 6.1  NEUTROABS 4.1  HGB 14.5  HCT 45.1  MCV 90.6  PLT 413    Basic Metabolic Panel:  Recent Labs  Lab 05/31/18 1205  NA 139  K 4.6  CL 99  CO2 26  GLUCOSE 123*  BUN 13  CREATININE 0.74  CALCIUM 9.5    Lipid Panel:     Component Value Date/Time   CHOL 243 (H) 06/01/2018 0548   TRIG 80 06/01/2018 0548   HDL 110 06/01/2018 0548   CHOLHDL 2.2 06/01/2018 0548   VLDL 16 06/01/2018 0548   LDLCALC 117 (H) 06/01/2018 0548   HgbA1c:  Lab Results  Component Value Date   HGBA1C 5.9 (H) 06/01/2018   Urine Drug Screen:     Component Value  Date/Time   LABOPIA NONE DETECTED 05/31/2018 1445   COCAINSCRNUR NONE DETECTED 05/31/2018 1445   LABBENZ NONE DETECTED 05/31/2018 1445   AMPHETMU NONE DETECTED 05/31/2018 1445   THCU NONE DETECTED 05/31/2018 1445   LABBARB NONE DETECTED 05/31/2018 1445    Alcohol Level     Component Value Date/Time   ETH <10 05/31/2018 1205    IMAGING   Ct Angio Head W Or Wo Contrast  Result Date: 05/31/2018 CLINICAL DATA:  Left hand and leg numbness. EXAM: CT ANGIOGRAPHY HEAD AND NECK TECHNIQUE: Multidetector CT imaging of the head and neck was performed using the standard protocol during bolus administration of intravenous contrast. Multiplanar CT image reconstructions and MIPs were obtained to evaluate the vascular anatomy. Carotid stenosis measurements (when applicable) are obtained utilizing NASCET criteria, using the distal internal carotid diameter as the denominator. CONTRAST:  34mL ISOVUE-370 IOPAMIDOL (ISOVUE-370) INJECTION 76% COMPARISON:  None. FINDINGS: CTA NECK FINDINGS Aortic arch: Standard 3 vessel aortic arch with moderate atherosclerotic plaque. No arch vessel origin stenosis. Right carotid system: Patent with predominantly soft plaque in the carotid bulb. No significant stenosis or dissection. Left carotid system: Patent with mild plaque at the carotid bifurcation.  No significant stenosis or dissection. Vertebral arteries: Patent with the right vertebral artery being moderately dominant. Severe left vertebral artery origin stenosis. Skeleton: Reversal of the cervical lordosis with disc degeneration most severe at C5-6. Other neck: Asymmetric fatty replacement of the right submandibular gland. Small thyroid nodules. Upper chest: Clear lung apices. Review of the MIP images confirms the above findings CTA HEAD FINDINGS Anterior circulation: The internal carotid arteries are patent from skull base to carotid termini with calcific atherosclerosis resulting in mild-to-moderate proximal supraclinoid  stenosis bilaterally. ACAs and MCAs are patent without evidence of proximal branch occlusion or significant stenosis. No aneurysm is identified. Posterior circulation: The intracranial vertebral arteries are patent to the basilar with atherosclerotic irregularity bilaterally and multiple mild left V4 stenoses. Patent PICAs, AICAs, and SCAs are seen bilaterally. There may be a severe left AICA origin stenosis. The basilar artery is patent with mild narrowing distally. A moderately large left posterior communicating artery is present. The left P1 segment is mildly hypoplastic. There is a proximal right P2 stenosis. No aneurysm is identified. Venous sinuses: As permitted by contrast timing, patent. Anatomic variants: None. Review of the MIP images confirms the above findings IMPRESSION: 1. No large vessel occlusion. 2. 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 3. Severe left vertebral artery origin stenosis. 4. Widely patent cervical carotid arteries. 5.  Aortic Atherosclerosis (ICD10-I70.0). These results were called by telephone at the time of interpretation on 05/31/2018 at 12:45 pm to [facility] , who verbally acknowledged these results. Electronically Signed   By: Logan Bores M.D.   On: 05/31/2018 13:01   Ct Angio Neck W Or Wo Contrast  Result Date: 05/31/2018 CLINICAL DATA:  Left hand and leg numbness. EXAM: CT ANGIOGRAPHY HEAD AND NECK TECHNIQUE: Multidetector CT imaging of the head and neck was performed using the standard protocol during bolus administration of intravenous contrast. Multiplanar CT image reconstructions and MIPs were obtained to evaluate the vascular anatomy. Carotid stenosis measurements (when applicable) are obtained utilizing NASCET criteria, using the distal internal carotid diameter as the denominator. CONTRAST:  18mL ISOVUE-370 IOPAMIDOL (ISOVUE-370) INJECTION 76% COMPARISON:  None. FINDINGS: CTA NECK FINDINGS Aortic arch:  Standard 3 vessel aortic arch with moderate atherosclerotic plaque. No arch vessel origin stenosis. Right carotid system: Patent with predominantly soft plaque in the carotid bulb. No significant stenosis or dissection. Left carotid system: Patent with mild plaque at the carotid bifurcation. No significant stenosis or dissection. Vertebral arteries: Patent with the right vertebral artery being moderately dominant. Severe left vertebral artery origin stenosis. Skeleton: Reversal of the cervical lordosis with disc degeneration most severe at C5-6. Other neck: Asymmetric fatty replacement of the right submandibular gland. Small thyroid nodules. Upper chest: Clear lung apices. Review of the MIP images confirms the above findings CTA HEAD FINDINGS Anterior circulation: The internal carotid arteries are patent from skull base to carotid termini with calcific atherosclerosis resulting in mild-to-moderate proximal supraclinoid stenosis bilaterally. ACAs and MCAs are patent without evidence of proximal branch occlusion or significant stenosis. No aneurysm is identified. Posterior circulation: The intracranial vertebral arteries are patent to the basilar with atherosclerotic irregularity bilaterally and multiple mild left V4 stenoses. Patent PICAs, AICAs, and SCAs are seen bilaterally. There may be a severe left AICA origin stenosis. The basilar artery is patent with mild narrowing distally. A moderately large left posterior communicating artery is present. The left P1 segment is mildly hypoplastic. There is a proximal right P2 stenosis. No aneurysm is identified. Venous sinuses:  As permitted by contrast timing, patent. Anatomic variants: None. Review of the MIP images confirms the above findings IMPRESSION: 1. No large vessel occlusion. 2. 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 3. Severe left vertebral artery origin stenosis. 4. Widely patent cervical carotid  arteries. 5.  Aortic Atherosclerosis (ICD10-I70.0). These results were called by telephone at the time of interpretation on 05/31/2018 at 12:45 pm to [facility] , who verbally acknowledged these results. Electronically Signed   By: Logan Bores M.D.   On: 05/31/2018 13:01   Mr Brain Wo Contrast  Result Date: 06/01/2018 CLINICAL DATA:  Stroke follow-up. Left-sided neglect. TPA administered yesterday. EXAM: MRI HEAD WITHOUT CONTRAST TECHNIQUE: Multiplanar, multiecho pulse sequences of the brain and surrounding structures were obtained without intravenous contrast. COMPARISON:  CT head 05/31/2018 FINDINGS: Brain: Acute infarct in the right parietal lobe. Small satellite areas of acute infarct in the right frontal and parietal lobe. Small area of hemorrhage in the right parietal infarct. Mild atrophy. Moderate chronic microvascular ischemic changes in the white matter and pons. Chronic microhemorrhage right cerebellum. Negative for mass lesion. Ventricle size normal. Vascular: Normal arterial flow voids Skull and upper cervical spine: Negative Sinuses/Orbits: Paranasal sinuses clear. Cataract surgery on the left. Other: None IMPRESSION: Acute infarct right frontal parietal lobe. Small amount of hemorrhage in the right parietal infarct. Moderate chronic microvascular ischemic changes in the white matter and pons. These results will be called to the ordering clinician or representative by the Radiologist Assistant, and communication documented in the PACS or zVision Dashboard. Electronically Signed   By: Franchot Gallo M.D.   On: 06/01/2018 13:08   Ct Head Code Stroke Wo Contrast  Result Date: 05/31/2018 CLINICAL DATA:  Code stroke. Focal neurological deficit. Clinical finding not specified. EXAM: CT HEAD WITHOUT CONTRAST TECHNIQUE: Contiguous axial images were obtained from the base of the skull through the vertex without intravenous contrast. COMPARISON:  None. FINDINGS: Brain: There are chronic small-vessel  ischemic changes affecting the pons and the cerebral hemispheric white matter. There is no CT evidence of acute infarction, mass lesion, hemorrhage, hydrocephalus or extra-axial collection. Vascular: There is atherosclerotic calcification of the major vessels at the base of the brain. Skull: Negative Sinuses/Orbits: Clear/normal Other: None ASPECTS ([place] Stroke Program Early CT Score) - Ganglionic level infarction (caudate, lentiform nuclei, internal capsule, insula, M1-M3 cortex): 7 - Supraganglionic infarction (M4-M6 cortex): 3 Total score (0-10 with 10 being normal): 10 IMPRESSION: 1. No acute finding by CT. 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 2. ASPECTS is 10. * These results were communicated to Dr. Cheral Marker at 12:28 pmon 2/6/2020by text page via the [facility] messaging system. Electronically Signed   By: Nelson Chimes M.D.   On: 05/31/2018 12:29   Ct Head Wo Contrast (charm Study)  Result Date: 05/31/2018 CLINICAL DATA:  78 year old female code stroke presentation with abnormal speech. Status post IV tPA at 1306 hours today. EXAM: CT HEAD WITHOUT CONTRAST TECHNIQUE: Contiguous axial images were obtained from the base of the skull through the vertex without intravenous contrast. COMPARISON:  CT head, CTA head and neck earlier today. FINDINGS: Brain: Stable gray-white matter differentiation throughout the brain. Patchy bilateral cerebral white matter hypodensity appears stable. No cortically based acute infarct identified. No acute intracranial hemorrhage identified. No midline shift, mass effect, or evidence of intracranial mass lesion. No ventriculomegaly. Vascular: Extensive Calcified atherosclerosis at the skull base. Skull: No acute osseous abnormality identified. Sinuses/Orbits: Visualized paranasal sinuses and mastoids are stable and well  pneumatized. Other: Stable orbit and scalp soft tissues. IMPRESSION: No acute intracranial abnormality identified. Stable non contrast CT  appearance of the brain from 1220 hours today. Electronically Signed   By: Genevie Ann M.D.   On: 05/31/2018 23:51   Transthoracic Echocardiogram  00/00/00 Pending  PHYSICAL EXAM Blood pressure (!) 138/91, pulse 76, temperature 99.5 F (37.5 C), temperature source Axillary, resp. rate (!) 24, height 5\' 5"  (1.651 m), weight 65.1 kg, SpO2 (!) 82 %.  Constitutional: Appears well-developed and well-nourished.  Psych: Affect appropriate to situation Eyes: No scleral injection HENT: normal Head: Normocephalic.  Cardiovascular: Normal rate and regular rhythm.  Respiratory: Effort normal, non-labored breathing GI: Soft.  No distension. There is no tenderness.  Skin: warm, dry  Neuro: Mental Status: Patient is awake, alert, oriented to person, place, month, year, and situation. Patient is able to give a clear and coherent history. No signs of aphasia or neglect Cranial Nerves: II: Visual Fields are full.  III,IV, VI: EOMI without ptosis or diploplia. Pupils are equal, round, and reactive to light.   V: Facial sensation decreaed  on left VII: Facial movement is symmetric.  VIII: hearing is intact to voice X: Palate elevates symmetrically XI: Shoulder shrug is symmetric. XII: tongue is midline   Motor: Left deltoid and tricep 4+/5; grip 2/5 and poor fine motor articulation. Diminished left grip strength. Diminished fine finger movements on the left. Orbits right over left upper extremity. otherwise 5/5 on left side and 5/5 left leg No further intermittent involuntary movements of LUE Sensory: Complete loss of light touch on left face, arm, leg.wnl on right Deep Tendon Reflexes: 2+ and symmetric in the biceps and patellae bilaterally.  Plantars: Toes are downgoing bilaterally.  Cerebellar: FNF and HKS are intact bilaterally      HOME MEDICATIONS:  Medications Prior to Admission  Medication Sig Dispense Refill  . aspirin 81 MG tablet Take 81 mg by mouth daily.        HOSPITAL  MEDICATIONS:  . pantoprazole (PROTONIX) IV  40 mg Intravenous QHS    ALLERGIES Allergies  Allergen Reactions  . Acetaminophen Rash    rash   ASSESSMENT/PLAN [name] is a 78 y.o. 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, some small hemorrhagic conversion seen (HI1) etiology embolic source undetermined  Resultant : left side sensory deficit and distal>prox weakness in left hand  CT head neg  MRI head -right frontal parietal region  CTA H&N -intracranial athero, inc severe Right P2 and mild bilat ICAs, severe left vert stenosis  2D Echo - pending  LDL - 117  HgbA1c - 5.9  UDS - neg  VTE prophylaxis - SCDs for now  Diet heart healthy  81mg  ASA prior to admission, now on   Patient counseled to be compliant with her antithrombotic medications  Ongoing aggressive stroke risk factor management  Therapy recommendations:  pending  Disposition:  Pending  Hypertension  Stable .  Permissive hypertension (OK if < 180/110) but gradually normalize in 5-7 days .  Long-term BP goal normotensive  Hyperlipidemia  Lipid lowering medication PTA:  none  LDL 117, goal < 70  Current lipid lowering medication:lipitor 40mg   Continue statin at discharge  Diabetes  HgbA1c 5.9, goal < 7.0  No hx of DM@  Other Stroke Risk  Factors  Advanced age  Other Active Problems  none  Plan Start aspirin 81 mg and pursue workup for source of embolism. Stroke rehab [facility] day # 1  Desiree metzger-Cihelka, ARNP-C, ANVP-BC I have personally obtained history,examined this patient, reviewed notes, independently viewed imaging studies, participated in medical decision making and plan of care.ROS completed by me personally and pertinent positives fully  documented  I have made any additions or clarifications directly to the above note. Agree with note above. 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 She received IV tPA and has only small hemorrhagic conversion. Recommend strict blood pressure control as per post TPA protocol. Close neurological monitoring.Start aspirin 81 mg daily has hemorrhagic transformation is quite small Check echocardiogram and cardiac monitoring. She will likely need TEE and loop recorder if no obvious source of embolism is found.long discussion with patient and husband and answered questions.This patient is critically ill and at significant risk of neurological worsening, death and care requires constant monitoring of vital signs, hemodynamics,respiratory and cardiac monitoring, extensive review of multiple databases, frequent neurological assessment, discussion with family, other specialists and medical decision making of high complexity.I have made any additions or clarifications directly to the above note.This critical care time does not reflect procedure time, or teaching time or supervisory time of PA/NP/Med Resident etc but could involve care discussion time.  I spent 35 minutes of neurocritical care time  in the care of  this patient.     Antony Contras, MD Medical Director [place] Pager: [phone] 06/01/2018 3:58 PM  To contact Stroke Continuity provider, please refer to [email or web address] After hours, contact General Neurology

## 2018-06-01 NOTE — Evaluation (Signed)
Speech Language Pathology Evaluation Patient Details Name: Mary Mercado MRN: [id number] DOB: August 15, 1940 Today's Date: 06/01/2018 Time: 2536-6440 SLP Time Calculation (min) (ACUTE ONLY): 30 min  Problem List:  Patient Active Problem List   Diagnosis Date Noted  . Stroke (cerebrum) ([place]) 05/31/2018  . SVT (supraventricular tachycardia) ([place]) 05/28/2015  . PVC's (premature ventricular contractions) 05/28/2015  . BENIGN POSITIONAL VERTIGO 06/16/2009  . ALLERGIC RHINITIS 06/16/2009   Past Medical History:  Past Medical History:  Diagnosis Date  . Allergy   . Vertigo, benign positional    Past Surgical History:  Past Surgical History:  Procedure Laterality Date  . Hemilaminectomy and microdiskectomy at L4-5 on the left.  12/10/2003   HPI:  Pt is a 78 y.o. female with history of vertigo.  She presented to the ED following emesis, left arm weakness, and decreased sensation in her left arm and left leg. While in CT she did sit up and have some emesis.  CT of head was negative and CTA did not show any large vessel occlusion. Patient decided to receive TPA while still in the tPA time window. MRI has been ordered but has not been completed as yet.   Assessment / Plan / Recommendation Clinical Impression  Pt was seen for speech/language/cognition evaluation with her husband present. Nursing reported that she has observed some delay in processing speed but no other significant deficits. Pt and her husband denied any change in baseline function and the pt's husband expressed that she has always taken time to think prior to respoding to questions. Based on the assessment, the pt's speech, language, and cognitive skills appear to be within functional limits and further skilled SLP services are therefore not clinically indicated at this time.     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)    Follow Up Recommendations  None    Frequency and Duration           SLP Evaluation Cognition  Overall Cognitive Status: Within Functional Limits for tasks assessed Arousal/Alertness: Awake/alert Orientation Level: Oriented X4 Attention: Focused;Sustained Focused Attention: Appears intact Sustained Attention: Appears intact Memory: Appears intact Awareness: Appears intact Problem Solving: Appears intact Executive Function: Reasoning;Sequencing Reasoning: Appears intact Sequencing: Appears intact Safety/Judgment: Appears intact       Comprehension  Auditory Comprehension Overall Auditory Comprehension: Appears within functional limits for tasks assessed Yes/No Questions: Within Functional Limits Commands: Within Functional Limits Conversation: Complex    Expression Expression Primary Mode of Expression: Verbal Verbal Expression Overall Verbal Expression: Appears within functional limits for tasks assessed Initiation: No impairment Automatic Speech: Counting;Day of week;Month of year Level of Generative/Spontaneous Verbalization: Sentence Repetition: No impairment Naming: No impairment Pragmatics: No impairment   Oral / Motor  Oral Motor/Sensory Function Overall Oral Motor/Sensory Function: Within functional limits Motor Speech Overall Motor Speech: Appears within functional limits for tasks assessed Respiration: Within functional limits Phonation: Normal Resonance: Within functional limits Articulation: Within functional limitis Intelligibility: Intelligible Motor Planning: Witnin functional limits Motor Speech Errors: Not applicable   Mary Mercado I. Hardin Negus, [place], [place] Office number [phone] Pager [place] 06/01/2018, 8:52 AM

## 2018-06-01 NOTE — Progress Notes (Signed)
Received report and transported patient to radiology department for exam. Assumed care of patient and monitored patient throughout transport and exam. Returned safely back to room. Bedside nurse resumed care at this time.

## 2018-06-01 NOTE — Progress Notes (Signed)
Rehab Admissions Coordinator Note:  Patient was screened by Retta Diones for appropriateness for an Inpatient Acute Rehab Consult.  At this time, we are recommending Inpatient Rehab consult.  Jodell Cipro M 06/01/2018, 2:40 PM  I can be reached at [phone].

## 2018-06-01 NOTE — Consult Note (Signed)
Physical Medicine and Rehabilitation Consult Reason for Consult: left side weakness Referring Physician: Dr. Leonie Man   HPI: Mary Mercado is a 78 y.o.right handed female with history of vertigo.  Patient lives with spouse independent prior to admission and active. Presented  05/31/2018 with left-sided weakness and decreased sensation as well as dizziness. She did have one episode of emesis. Cranial CT scan negative for acute changes. Patient did receive TPA. CT angiogram of head and neck no large vessel occlusion. Patient noted have elevated blood pressure placed on Cardene. 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 Small amount of hemorrhage in the right parietal infarction. Hold on anticoagulation due to hemorrhagic conversion. Tolerating a regular diet. Therapy evaluation completed with recommendations of physical medicine rehabilitation consult.   Review of Systems  Constitutional: Negative for fever.  HENT: Negative for hearing loss.   Eyes: Negative for blurred vision and double vision.  Respiratory: Negative for cough and shortness of breath.   Cardiovascular: Negative for chest pain, palpitations and leg swelling.  Gastrointestinal: Positive for nausea and vomiting.  Genitourinary: Negative for dysuria, hematuria and urgency.  Musculoskeletal: Positive for myalgias.  Skin: Negative for rash.  Neurological: Positive for dizziness and focal weakness.  All other systems reviewed and are negative.  Past Medical History:  Diagnosis Date  . Allergy   . Vertigo, benign positional    Past Surgical History:  Procedure Laterality Date  . Hemilaminectomy and microdiskectomy at L4-5 on the left.  12/10/2003   Family History  Problem Relation Age of Onset  . Colon cancer Father   . Diabetes Mother   . Heart attack Mother        78  . Heart attack Brother 47   Social History:  reports that she  has never smoked. She has never used smokeless tobacco. She reports that she does not drink alcohol or use drugs. Allergies:  Allergies  Allergen Reactions  . Acetaminophen Rash    rash   Medications Prior to Admission  Medication Sig Dispense Refill  . aspirin 81 MG tablet Take 81 mg by mouth daily.      Home: Home Living Family/patient expects to be discharged to:: Private residence Living Arrangements: Spouse/significant other Available Help at Discharge: Family, [place] Type of Home: House Home Access: Level entry [place]: One level Bathroom Shower/Tub: Multimedia programmer: Handicapped height Additional Comments: reports fully handicapped bathroom  Lives With: Spouse  Functional History: Prior Function Level of Independence: Independent Comments: active in the church and home Functional Status:  Mobility: Bed Mobility Overal bed mobility: Needs Assistance Bed Mobility: Supine to Sit, Sit to Supine Supine to sit: Min assist Sit to supine: Mod assist, +2 for physical assistance General bed mobility comments: Min A with cueing for sequencing to come to EOB - poor proprioception of L LE; Mod A +2 to return to supine due to increased complaints of nausea and not feeling well; able to sit EOB x ~6 min with Min/Mod A for balance Transfers Overall transfer level: Needs assistance Equipment used: 2 person hand held assist Transfers: Sit to/from Stand Sit to Stand: Mod assist, +2 physical assistance General transfer comment: Mod A +2 to rise from seated position with +2 HHA; able to take small shuffle steps for bed position; verbal and tactile cueing for weight shift and LE movement; required L knee blocking to prevent buckling      ADL:  Cognition: Cognition Overall Cognitive Status: Within Functional Limits for tasks assessed Arousal/Alertness: Awake/alert Orientation Level: Oriented X4 Attention: Focused, Sustained Focused  Attention: Appears intact Sustained Attention: Appears intact Memory: Appears intact Awareness: Appears intact Problem Solving: Appears intact Executive Function: Reasoning, Sequencing Reasoning: Appears intact Sequencing: Appears intact Safety/Judgment: Appears intact Cognition Arousal/Alertness: Awake/alert, Lethargic Behavior During Therapy: WFL for tasks assessed/performed Overall Cognitive Status: Within Functional Limits for tasks assessed  Blood pressure (!) 138/91, pulse 76, temperature 99.5 F (37.5 C), temperature source Axillary, resp. rate (!) 24, height 5\' 5"  (1.651 m), weight 65.1 kg, SpO2 (!) 82 %. Physical Exam  Vitals reviewed. Constitutional: She is oriented to person, place, and time.  HENT:  Head: Normocephalic.  Eyes: EOM are normal.  Neck: Normal range of motion. Neck supple. No thyromegaly present.  Cardiovascular: Normal rate, regular rhythm and normal heart sounds.  Respiratory: Effort normal and breath sounds normal. No respiratory distress.  GI: Soft. Bowel sounds are normal. She exhibits no distension.  Neurological: She is alert and oriented to person, place, and time.  Follows commands    Results for orders placed or performed during the hospital encounter of 05/31/18 (from the past 24 hour(s))  Hemoglobin A1c     Status: Abnormal   Collection Time: 06/01/18  5:48 AM  Result Value Ref Range   Hgb A1c MFr Bld 5.9 (H) 4.8 - 5.6 %   Mean Plasma Glucose 122.63 mg/dL  Lipid panel     Status: Abnormal   Collection Time: 06/01/18  5:48 AM  Result Value Ref Range   Cholesterol 243 (H) 0 - 200 mg/dL   Triglycerides 80 <150 mg/dL   HDL 110 >40 mg/dL   Total CHOL/HDL Ratio 2.2 RATIO   VLDL 16 0 - 40 mg/dL   LDL Cholesterol 117 (H) 0 - 99 mg/dL   Ct Angio Head W Or Wo Contrast  Result Date: 05/31/2018 CLINICAL DATA:  Left hand and leg numbness. EXAM: CT ANGIOGRAPHY HEAD AND NECK TECHNIQUE: Multidetector CT imaging of the head and neck was performed  using the standard protocol during bolus administration of intravenous contrast. Multiplanar CT image reconstructions and MIPs were obtained to evaluate the vascular anatomy. Carotid stenosis measurements (when applicable) are obtained utilizing NASCET criteria, using the distal internal carotid diameter as the denominator. CONTRAST:  28mL ISOVUE-370 IOPAMIDOL (ISOVUE-370) INJECTION 76% COMPARISON:  None. FINDINGS: CTA NECK FINDINGS Aortic arch: Standard 3 vessel aortic arch with moderate atherosclerotic plaque. No arch vessel origin stenosis. Right carotid system: Patent with predominantly soft plaque in the carotid bulb. No significant stenosis or dissection. Left carotid system: Patent with mild plaque at the carotid bifurcation. No significant stenosis or dissection. Vertebral arteries: Patent with the right vertebral artery being moderately dominant. Severe left vertebral artery origin stenosis. Skeleton: Reversal of the cervical lordosis with disc degeneration most severe at C5-6. Other neck: Asymmetric fatty replacement of the right submandibular gland. Small thyroid nodules. Upper chest: Clear lung apices. Review of the MIP images confirms the above findings CTA HEAD FINDINGS Anterior circulation: The internal carotid arteries are patent from skull base to carotid termini with calcific atherosclerosis resulting in mild-to-moderate proximal supraclinoid stenosis bilaterally. ACAs and MCAs are patent without evidence of proximal branch occlusion or significant stenosis. No aneurysm is identified. Posterior circulation: The intracranial vertebral arteries are patent to the basilar with atherosclerotic irregularity bilaterally and multiple mild left V4 stenoses. Patent PICAs, AICAs, and SCAs are seen bilaterally. There may be a severe left AICA origin stenosis. The  basilar artery is patent with mild narrowing distally. A moderately large left posterior communicating artery is present. The left P1 segment is  mildly hypoplastic. There is a proximal right P2 stenosis. No aneurysm is identified. Venous sinuses: As permitted by contrast timing, patent. Anatomic variants: None. Review of the MIP images confirms the above findings IMPRESSION: 1. No large vessel occlusion. 2. 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 3. Severe left vertebral artery origin stenosis. 4. Widely patent cervical carotid arteries. 5.  Aortic Atherosclerosis (ICD10-I70.0). These results were called by telephone at the time of interpretation on 05/31/2018 at 12:45 pm to [facility] , who verbally acknowledged these results. Electronically Signed   By: Logan Bores M.D.   On: 05/31/2018 13:01   Ct Angio Neck W Or Wo Contrast  Result Date: 05/31/2018 CLINICAL DATA:  Left hand and leg numbness. EXAM: CT ANGIOGRAPHY HEAD AND NECK TECHNIQUE: Multidetector CT imaging of the head and neck was performed using the standard protocol during bolus administration of intravenous contrast. Multiplanar CT image reconstructions and MIPs were obtained to evaluate the vascular anatomy. Carotid stenosis measurements (when applicable) are obtained utilizing NASCET criteria, using the distal internal carotid diameter as the denominator. CONTRAST:  60mL ISOVUE-370 IOPAMIDOL (ISOVUE-370) INJECTION 76% COMPARISON:  None. FINDINGS: CTA NECK FINDINGS Aortic arch: Standard 3 vessel aortic arch with moderate atherosclerotic plaque. No arch vessel origin stenosis. Right carotid system: Patent with predominantly soft plaque in the carotid bulb. No significant stenosis or dissection. Left carotid system: Patent with mild plaque at the carotid bifurcation. No significant stenosis or dissection. Vertebral arteries: Patent with the right vertebral artery being moderately dominant. Severe left vertebral artery origin stenosis. Skeleton: Reversal of the cervical lordosis with disc degeneration most severe at C5-6. Other neck:  Asymmetric fatty replacement of the right submandibular gland. Small thyroid nodules. Upper chest: Clear lung apices. Review of the MIP images confirms the above findings CTA HEAD FINDINGS Anterior circulation: The internal carotid arteries are patent from skull base to carotid termini with calcific atherosclerosis resulting in mild-to-moderate proximal supraclinoid stenosis bilaterally. ACAs and MCAs are patent without evidence of proximal branch occlusion or significant stenosis. No aneurysm is identified. Posterior circulation: The intracranial vertebral arteries are patent to the basilar with atherosclerotic irregularity bilaterally and multiple mild left V4 stenoses. Patent PICAs, AICAs, and SCAs are seen bilaterally. There may be a severe left AICA origin stenosis. The basilar artery is patent with mild narrowing distally. A moderately large left posterior communicating artery is present. The left P1 segment is mildly hypoplastic. There is a proximal right P2 stenosis. No aneurysm is identified. Venous sinuses: As permitted by contrast timing, patent. Anatomic variants: None. Review of the MIP images confirms the above findings IMPRESSION: 1. No large vessel occlusion. 2. 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 3. Severe left vertebral artery origin stenosis. 4. Widely patent cervical carotid arteries. 5.  Aortic Atherosclerosis (ICD10-I70.0). These results were called by telephone at the time of interpretation on 05/31/2018 at 12:45 pm to [facility] , who verbally acknowledged these results. Electronically Signed   By: Logan Bores M.D.   On: 05/31/2018 13:01   Mr Brain Wo Contrast  Result Date: 06/01/2018 CLINICAL DATA:  Stroke follow-up. Left-sided neglect. TPA administered yesterday. EXAM: MRI HEAD WITHOUT CONTRAST TECHNIQUE: Multiplanar, multiecho pulse sequences of the brain and surrounding structures were obtained without intravenous  contrast. COMPARISON:  CT head 05/31/2018 FINDINGS: Brain: Acute infarct in the  right parietal lobe. Small satellite areas of acute infarct in the right frontal and parietal lobe. Small area of hemorrhage in the right parietal infarct. Mild atrophy. Moderate chronic microvascular ischemic changes in the white matter and pons. Chronic microhemorrhage right cerebellum. Negative for mass lesion. Ventricle size normal. Vascular: Normal arterial flow voids Skull and upper cervical spine: Negative Sinuses/Orbits: Paranasal sinuses clear. Cataract surgery on the left. Other: None IMPRESSION: Acute infarct right frontal parietal lobe. Small amount of hemorrhage in the right parietal infarct. Moderate chronic microvascular ischemic changes in the white matter and pons. These results will be called to the ordering clinician or representative by the Radiologist Assistant, and communication documented in the PACS or zVision Dashboard. Electronically Signed   By: Franchot Gallo M.D.   On: 06/01/2018 13:08   Ct Head Code Stroke Wo Contrast  Result Date: 05/31/2018 CLINICAL DATA:  Code stroke. Focal neurological deficit. Clinical finding not specified. EXAM: CT HEAD WITHOUT CONTRAST TECHNIQUE: Contiguous axial images were obtained from the base of the skull through the vertex without intravenous contrast. COMPARISON:  None. FINDINGS: Brain: There are chronic small-vessel ischemic changes affecting the pons and the cerebral hemispheric white matter. There is no CT evidence of acute infarction, mass lesion, hemorrhage, hydrocephalus or extra-axial collection. Vascular: There is atherosclerotic calcification of the major vessels at the base of the brain. Skull: Negative Sinuses/Orbits: Clear/normal Other: None ASPECTS ([place] Stroke Program Early CT Score) - Ganglionic level infarction (caudate, lentiform nuclei, internal capsule, insula, M1-M3 cortex): 7 - Supraganglionic infarction (M4-M6 cortex): 3 Total score (0-10 with 10  being normal): 10 IMPRESSION: 1. No acute finding by CT. 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 2. ASPECTS is 10. * These results were communicated to Dr. Cheral Marker at 12:28 pmon 2/6/2020by text page via the [facility] messaging system. Electronically Signed   By: Nelson Chimes M.D.   On: 05/31/2018 12:29   Ct Head Wo Contrast (charm Study)  Result Date: 05/31/2018 CLINICAL DATA:  78 year old female code stroke presentation with abnormal speech. Status post IV tPA at 1306 hours today. EXAM: CT HEAD WITHOUT CONTRAST TECHNIQUE: Contiguous axial images were obtained from the base of the skull through the vertex without intravenous contrast. COMPARISON:  CT head, CTA head and neck earlier today. FINDINGS: Brain: Stable gray-white matter differentiation throughout the brain. Patchy bilateral cerebral white matter hypodensity appears stable. No cortically based acute infarct identified. No acute intracranial hemorrhage identified. No midline shift, mass effect, or evidence of intracranial mass lesion. No ventriculomegaly. Vascular: Extensive Calcified atherosclerosis at the skull base. Skull: No acute osseous abnormality identified. Sinuses/Orbits: Visualized paranasal sinuses and mastoids are stable and well pneumatized. Other: Stable orbit and scalp soft tissues. IMPRESSION: No acute intracranial abnormality identified. Stable non contrast CT appearance of the brain from 1220 hours today. Electronically Signed   By: Genevie Ann M.D.   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 Does the need for close, 24 hr/day medical supervision in concert with the patient's rehab needs make it unreasonable for this patient to be served in a less intensive setting? Yes 2. 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 Due to bladder management, bowel  management, safety, skin/wound care, disease management, medication administration, pain management and patient education, does the patient require 24 hr/day rehab nursing? Yes 4. Does the patient require coordinated care of a physician, rehab nurse, PT (1-2 hrs/day, 5  days/week) and OT (-2 hrs/day, 5 days/week) to address physical and functional deficits in the context of the above medical diagnosis(es)? Yes Addressing deficits in the following areas: balance, endurance, locomotion, strength, transferring, bowel/bladder control, bathing, dressing, feeding, grooming, toileting and psychosocial support 5. Can the patient actively participate in an intensive therapy program of at least 3 hrs of therapy per day at least 5 days per week? Yes 6. The potential for patient to make measurable gains while on inpatient rehab is good 7. Anticipated functional outcomes upon discharge from inpatient rehab are supervision  with PT, supervision with OT, n/a with SLP. 8. Estimated rehab length of stay to reach the above functional goals is: 11-18 days 9. Anticipated D/C setting: Home 10. Anticipated post D/C treatments: HH therapy and Outpatient therapy 11. Overall Rehab/Functional Prognosis: excellent  RECOMMENDATIONS: This patient's condition is appropriate for continued rehabilitative care in the following setting: CIR Patient has agreed to participate in recommended program. Yes Note that insurance prior authorization may be required for reimbursement for recommended care.  Comment: Rehab Admissions Coordinator to follow up.  Thanks,  Meredith Staggers, MD, Mellody Drown  I have personally performed a face to face diagnostic evaluation of this patient. Additionally, I have reviewed and concur with the physician assistant's documentation above.    Lavon Paganini Angiulli, PA-C 06/01/2018

## 2018-06-01 NOTE — Evaluation (Signed)
Physical Therapy Evaluation Patient Details Name: Mary Mercado MRN: [id number] DOB: 05/14/1940 Today's Date: 06/01/2018   History of Present Illness  Patient is a 78 y/o female presenting to the ED on 05/31/2018 with primary complaints of L sided decreased sensation along with N&V. CT of head did not show any blood and CTA was obtained.  CTA did not show any large vessel occlusion. MRI pending. tPA administered at 1306. PMH of vertigo.    Clinical Impression  Mary Mercado is a very pleasant 78 y/o female admitted with the above listed diagnosis. Patient reports IND with mobility and ADLs prior to admission. Patient today with noted reduced sensation of L UE/LE as well as poor proprioception, difficulty with all aspects of mobility with reduced sitting and standing balance, with limited tolerance to activity and upright positioning. Patient requiring Min A for supine to sit with Mod A +2 for sit to stand at bedside. Will currently recommend CIR level therapies at discharge as she was IND prior to admission and now requiring physical assist for mobility. PT to follow acutely.       Follow Up Recommendations CIR    Equipment Recommendations  Other (comment)(defer)    Recommendations for Other Services Rehab consult;OT consult     Precautions / Restrictions Precautions Precautions: Fall Restrictions Weight Bearing Restrictions: No      Mobility  Bed Mobility Overal bed mobility: Needs Assistance Bed Mobility: Supine to Sit;Sit to Supine     Supine to sit: Min assist Sit to supine: Mod assist;+2 for physical assistance   General bed mobility comments: Min A with cueing for sequencing to come to EOB - poor proprioception of L LE; Mod A +2 to return to supine due to increased complaints of nausea and not feeling well; able to sit EOB x ~6 min with Min/Mod A for balance  Transfers Overall transfer level: Needs assistance Equipment used: 2 person hand held assist Transfers: Sit  to/from Stand Sit to Stand: Mod assist;+2 physical assistance         General transfer comment: Mod A +2 to rise from seated position with +2 HHA; able to take small shuffle steps for bed position; verbal and tactile cueing for weight shift and LE movement; required L knee blocking to prevent buckling  Ambulation/Gait                Stairs            Wheelchair Mobility    Modified Rankin (Stroke Patients Only) Modified Rankin (Stroke Patients Only) Pre-Morbid Rankin Score: No symptoms Modified Rankin: Moderately severe disability     Balance Overall balance assessment: Needs assistance Sitting-balance support: Single extremity supported;Feet supported Sitting balance-Leahy Scale: Poor   Postural control: Left lateral lean;Posterior lean Standing balance support: Bilateral upper extremity supported;During functional activity Standing balance-Leahy Scale: Poor                               Pertinent Vitals/Pain Pain Assessment: No/denies pain    Home Living Family/patient expects to be discharged to:: Private residence Living Arrangements: Spouse/significant other Available Help at Discharge: Family;Skilled Nursing Facility Type of Home: House Home Access: Level entry     Home Layout: One level   Additional Comments: reports fully handicapped bathroom    Prior Function Level of Independence: Independent         Comments: active in the church and home     Hand Dominance  Extremity/Trunk Assessment   Upper Extremity Assessment Upper Extremity Assessment: Defer to OT evaluation    Lower Extremity Assessment Lower Extremity Assessment: Generalized weakness;RLE deficits/detail;LLE deficits/detail RLE Deficits / Details: lifts against gravity and can hold against moderate pressure RLE Sensation: WNL LLE Deficits / Details: able to lift against gravity and can hold against minimal pressure LLE Sensation: decreased light  touch;decreased proprioception LLE Coordination: decreased fine motor;decreased gross motor    Cervical / Trunk Assessment Cervical / Trunk Assessment: Normal  Communication   Communication: No difficulties  Cognition Arousal/Alertness: Awake/alert;Lethargic Behavior During Therapy: WFL for tasks assessed/performed Overall Cognitive Status: Within Functional Limits for tasks assessed                                        General Comments General comments (skin integrity, edema, etc.): husband present and supportive     Exercises     Assessment/Plan    PT Assessment Patient needs continued PT services  PT Problem List Decreased strength;Decreased activity tolerance;Decreased balance;Decreased mobility;Decreased coordination;Decreased knowledge of use of DME;Decreased safety awareness;Impaired sensation       PT Treatment Interventions DME instruction;Gait training;Functional mobility training;Therapeutic activities;Therapeutic exercise;Balance training;Neuromuscular re-education;Patient/family education    PT Goals (Current goals can be found in the Care Plan section)  Acute Rehab PT Goals Patient Stated Goal: "regain independence" PT Goal Formulation: With patient Time For Goal Achievement: 06/15/18 Potential to Achieve Goals: Good    Frequency Min 4X/week   Barriers to discharge        Co-evaluation               AM-PAC PT "6 Clicks" Mobility  Outcome Measure Help needed turning from your back to your side while in a flat bed without using bedrails?: A Little Help needed moving from lying on your back to sitting on the side of a flat bed without using bedrails?: A Little Help needed moving to and from a bed to a chair (including a wheelchair)?: A Lot Help needed standing up from a chair using your arms (e.g., wheelchair or bedside chair)?: A Lot Help needed to walk in hospital room?: Total Help needed climbing 3-5 steps with a railing? :  Total 6 Click Score: 12    End of Session Equipment Utilized During Treatment: Gait belt Activity Tolerance: Patient tolerated treatment well;Treatment limited secondary to medical complications (Comment)(increased nausea) Patient left: in bed;with call bell/phone within reach;with bed alarm set;with family/visitor present Nurse Communication: Mobility status PT Visit Diagnosis: Unsteadiness on feet (R26.81);Other abnormalities of gait and mobility (R26.89);Muscle weakness (generalized) (M62.81)    Time: 8657-8469 PT Time Calculation (min) (ACUTE ONLY): 35 min   Charges:   PT Evaluation $PT Eval Moderate Complexity: 1 Mod PT Treatments $Therapeutic Activity: 8-22 mins         Lanney Gins, PT, DPT Supplemental Physical Therapist 06/01/18 1:21 PM Pager: [phone] Office: [phone]

## 2018-06-01 NOTE — Progress Notes (Signed)
PT Cancellation Note  Patient Details Name: Mary Mercado MRN: [id number] DOB: 11-13-1940   Cancelled Treatment:    Reason Eval/Treat Not Completed: Active bedrest order Strict bedrest order. Will need updated activity orders prior to PT eval/treat. Will continue to follow.    Lanney Gins, PT, DPT Supplemental Physical Therapist 06/01/18 8:42 AM Pager: [phone] Office: [phone]

## 2018-06-01 NOTE — Evaluation (Signed)
Occupational Therapy Evaluation Patient Details Name: Mary Mercado MRN: [id number] DOB: Dec 21, 1940 Today's Date: 06/01/2018    History of Present Illness Patient is a 78 y/o female presenting to the ED on 05/31/2018 with primary complaints of L sided decreased sensation along with N&V. CT of head did not show any blood and CTA was obtained.  CTA did not show any large vessel occlusion. 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 Small amount of hemorrhage in the right parietal infarct. Moderate chronic microvascular ischemic changes in the white matter and pons. tPA administered at 1306. PMH of vertigo.   Clinical Impression   This 78 y/o female presents with the above. PTA pt was independent with ADL, iADL and functional mobility. Pt presenting with LUE deficits including weakness, decreased coordination, sensation and proprioception; questionable L inattention/visual deficits (to be further assessed). She currently requires minA for UB ADL, maxA for LB ADL, and requires two person assist for safe transfers OOB at this time. Pt very pleasant throughout session and verbalizes has good support at home at time of discharge. She will benefit from continued acute OT services and recommend follow up therapy services at CIR level at time of discharge to maximize her safety and independence with ADL and mobility. Will follow.     Follow Up Recommendations  CIR;Supervision/Assistance - 24 hour    Equipment Recommendations  3 in 1 bedside commode;Other (comment)(TBD in next venue)           Precautions / Restrictions Precautions Precautions: Fall Restrictions Weight Bearing Restrictions: No      Mobility Bed Mobility Overal bed mobility: Needs Assistance Bed Mobility: Supine to Sit;Sit to Supine     Supine to sit: Min assist Sit to supine: Mod assist;+2 for physical assistance   General bed mobility comments: Min A with cueing for sequencing to come to EOB - poor  proprioception of L LE; Mod A +2 to return to supine due to increased complaints of nausea and not feeling well; able to sit EOB x ~6 min with Min/Mod A for balance  Transfers Overall transfer level: Needs assistance Equipment used: 2 person hand held assist Transfers: Sit to/from Stand Sit to Stand: Mod assist;+2 physical assistance         General transfer comment: currently requires two person assist for safe transfer completion    Balance Overall balance assessment: Needs assistance Sitting-balance support: Single extremity supported;Feet supported Sitting balance-Leahy Scale: Poor   Postural control: Left lateral lean;Posterior lean Standing balance support: Bilateral upper extremity supported;During functional activity Standing balance-Leahy Scale: Poor                             ADL either performed or assessed with clinical judgement   ADL Overall ADL's : Needs assistance/impaired Eating/Feeding: Set up;Minimal assistance;Sitting   Grooming: Set up;Minimal assistance;Sitting;Bed level   Upper Body Bathing: Minimal assistance;Sitting;Bed level       Upper Body Dressing : Minimal assistance;Sitting;Bed level                     General ADL Comments: pt only agreeable to bed level activity due to fatigue, had previously worked with PT today and just finished getting bathed with nursing assist; will continue to assess ADL and mobility needs     Vision Baseline Vision/History: Cataracts;Wears glasses Wears Glasses: Reading only Patient Visual Report: No change from baseline Vision Assessment?: Yes Eye Alignment: Within Functional Limits Ocular Range  of Motion: Within Functional Limits Alignment/Gaze Preference: Within Defined Limits Tracking/Visual Pursuits: Able to track stimulus in all quads without difficulty Additional Comments: question L inattention/L peripheral deficits? to be further assessed      Perception     Praxis       Pertinent Vitals/Pain Pain Assessment: No/denies pain     Hand Dominance Right   Extremity/Trunk Assessment Upper Extremity Assessment Upper Extremity Assessment: LUE deficits/detail LUE Deficits / Details: grossly 2/5 throughout shoulder and elbow, decreased digit ROM and weak grip strength; decreased sensation and impaired proprioception; edemous LUE LUE Sensation: decreased light touch;decreased proprioception LUE Coordination: decreased fine motor   Lower Extremity Assessment Lower Extremity Assessment: Defer to PT evaluation RLE Deficits / Details: lifts against gravity and can hold against moderate pressure RLE Sensation: WNL LLE Deficits / Details: able to lift against gravity and can hold against minimal pressure LLE Sensation: decreased light touch;decreased proprioception LLE Coordination: decreased fine motor;decreased gross motor   Cervical / Trunk Assessment Cervical / Trunk Assessment: Normal   Communication Communication Communication: No difficulties   Cognition Arousal/Alertness: Awake/alert;Lethargic Behavior During Therapy: WFL for tasks assessed/performed Overall Cognitive Status: Within Functional Limits for tasks assessed                                     General Comments  VSS    Exercises     Shoulder Instructions      Home Living Family/patient expects to be discharged to:: Private residence Living Arrangements: Spouse/significant other Available Help at Discharge: Family;Skilled Nursing Facility Type of Home: House Home Access: Level entry     Home Layout: One level     Bathroom Shower/Tub: Occupational psychologist: Handicapped height         Additional Comments: reports fully handicapped bathroom  Lives With: Spouse    Prior Functioning/Environment Level of Independence: Independent        Comments: active in the church and home        OT Problem List: Decreased strength;Decreased range of  motion;Decreased activity tolerance;Impaired balance (sitting and/or standing);Decreased coordination;Decreased knowledge of use of DME or AE;Impaired UE functional use;Impaired sensation;Increased edema      OT Treatment/Interventions: Self-care/ADL training;Therapeutic exercise;Energy conservation;Neuromuscular education;DME and/or AE instruction;Therapeutic activities;Visual/perceptual remediation/compensation;Patient/family education;Balance training;Cognitive remediation/compensation    OT Goals(Current goals can be found in the care plan section) Acute Rehab OT Goals Patient Stated Goal: "regain independence" OT Goal Formulation: With patient Time For Goal Achievement: 06/15/18 Potential to Achieve Goals: Good  OT Frequency: Min 3X/week   Barriers to D/C:            Co-evaluation              AM-PAC OT "6 Clicks" Daily Activity     Outcome Measure Help from another person eating meals?: A Little Help from another person taking care of personal grooming?: A Little Help from another person toileting, which includes using toliet, bedpan, or urinal?: Total Help from another person bathing (including washing, rinsing, drying)?: A Lot Help from another person to put on and taking off regular upper body clothing?: A Lot Help from another person to put on and taking off regular lower body clothing?: Total 6 Click Score: 12   End of Session Nurse Communication: Mobility status;Other (comment)(iv site bleeding)  Activity Tolerance: Patient limited by fatigue Patient left: in bed;with call bell/phone within reach;with bed alarm set  OT Visit Diagnosis: Muscle weakness (generalized) (M62.81);Hemiplegia and hemiparesis Hemiplegia - Right/Left: Left Hemiplegia - dominant/non-dominant: Non-Dominant Hemiplegia - caused by: Cerebral infarction                Time: 7867-5449 OT Time Calculation (min): 20 min Charges:  OT General Charges $OT Visit: 1 Visit OT Evaluation $OT Eval  Moderate Complexity: [place], OT [employer] Pager [phone] Office [phone]   Raymondo Band 06/01/2018, 4:12 PM

## 2018-06-01 NOTE — Progress Notes (Signed)
Called MRI to sched MRI around 1 PM. No answer, will try again.

## 2018-06-01 NOTE — Progress Notes (Signed)
Tentative appt given for 12:30 MRI.

## 2018-06-02 ENCOUNTER — Inpatient Hospital Stay (HOSPITAL_COMMUNITY): Payer: Medicare Other

## 2018-06-02 DIAGNOSIS — I639 Cerebral infarction, unspecified: Secondary | ICD-10-CM

## 2018-06-02 DIAGNOSIS — R002 Palpitations: Secondary | ICD-10-CM

## 2018-06-02 DIAGNOSIS — E785 Hyperlipidemia, unspecified: Secondary | ICD-10-CM

## 2018-06-02 DIAGNOSIS — I63411 Cerebral infarction due to embolism of right middle cerebral artery: Principal | ICD-10-CM

## 2018-06-02 MED ORDER — TRAMADOL HCL 50 MG PO TABS
50.0000 mg | ORAL_TABLET | Freq: Four times a day (QID) | ORAL | Status: DC | PRN
  Administered 2018-06-02: 50 mg via ORAL
  Filled 2018-06-02: qty 1

## 2018-06-02 MED ORDER — ENOXAPARIN SODIUM 40 MG/0.4ML ~~LOC~~ SOLN
40.0000 mg | SUBCUTANEOUS | Status: DC
  Administered 2018-06-02 – 2018-06-04 (×3): 40 mg via SUBCUTANEOUS
  Filled 2018-06-02 (×3): qty 0.4

## 2018-06-02 MED ORDER — CLOPIDOGREL BISULFATE 75 MG PO TABS
75.0000 mg | ORAL_TABLET | Freq: Every day | ORAL | Status: DC
  Administered 2018-06-02 – 2018-06-05 (×4): 75 mg via ORAL
  Filled 2018-06-02 (×4): qty 1

## 2018-06-02 NOTE — Progress Notes (Signed)
Bilateral lower extremity venous has been completed.    Abram Sander 06/02/2018 3:02 PM

## 2018-06-02 NOTE — Progress Notes (Signed)
STROKE TEAM PROGRESS NOTE   SUBJECTIVE (INTERVAL HISTORY) Her husband is at the bedside. She still has left arm weakness. Left leg weakness much improved. Plan for TEE and loop Monday.   OBJECTIVE Vitals:   06/02/18 0332 06/02/18 0803 06/02/18 1233 06/02/18 1255  BP: (!) 147/86 (!) 141/81 126/69 (!) 126/59  Pulse: 72 70 77 76  Resp: 18 14  16   Temp: 98.2 F (36.8 C) 98.3 F (36.8 C) 98 F (36.7 C) 98.2 F (36.8 C)  TempSrc: Oral Oral Oral Oral  SpO2: 99% 97% 99% 97%  Weight:      Height:        CBC:  Recent Labs  Lab 05/31/18 1205  WBC 6.1  NEUTROABS 4.1  HGB 14.5  HCT 45.1  MCV 90.6  PLT 149    Basic Metabolic Panel:  Recent Labs  Lab 05/31/18 1205  NA 139  K 4.6  CL 99  CO2 26  GLUCOSE 123*  BUN 13  CREATININE 0.74  CALCIUM 9.5    Lipid Panel:     Component Value Date/Time   CHOL 243 (H) 06/01/2018 0548   TRIG 80 06/01/2018 0548   HDL 110 06/01/2018 0548   CHOLHDL 2.2 06/01/2018 0548   VLDL 16 06/01/2018 0548   LDLCALC 117 (H) 06/01/2018 0548   HgbA1c:  Lab Results  Component Value Date   HGBA1C 5.9 (H) 06/01/2018   Urine Drug Screen:     Component Value Date/Time   LABOPIA NONE DETECTED 05/31/2018 1445   COCAINSCRNUR NONE DETECTED 05/31/2018 1445   LABBENZ NONE DETECTED 05/31/2018 1445   AMPHETMU NONE DETECTED 05/31/2018 1445   THCU NONE DETECTED 05/31/2018 1445   LABBARB NONE DETECTED 05/31/2018 1445    Alcohol Level     Component Value Date/Time   ETH <10 05/31/2018 1205    IMAGING  Mr Brain Wo Contrast 06/01/2018 Acute infarct right frontal parietal lobe. Small amount of hemorrhage in the right parietal infarct. Moderate chronic microvascular ischemic changes in the white matter and pons.   Ct Head Wo Contrast (charm Study) 05/31/2018 IMPRESSION:  No acute intracranial abnormality identified. Stable non contrast CT appearance of the brain from 1220 hours today.   CTA head and neck 1. No large vessel occlusion. 2.  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 3. Severe left vertebral artery origin stenosis. 4. Widely patent cervical carotid arteries. 5.  Aortic Atherosclerosis (ICD10-I70.0).  Transthoracic Echocardiogram  06/02/18 IMPRESSIONS  1. The left ventricle has normal systolic function of 70-26%. The cavity size was normal. There is no increased left ventricular wall thickness. Echo evidence of pseudonormalization in diastolic relaxation and indeterminate left ventricular filling pressure.  2. Grade 2 diastolic dysfunction.  3. The right ventricle has normal systolic function. The cavity was normal. There is no increase in right ventricular wall thickness.  4. Left atrial size was severely dilated.  5. Right atrial size was severely dilated.  6. The mitral valve is normal in structure.  7. The tricuspid valve is normal in structure. Tricuspid valve regurgitation is moderate.  8. The aortic valve is normal in structure. There is moderate thickening and moderate calcification of the aortic valve.  9. The pulmonic valve was normal in structure.   PHYSICAL EXAM  Temp:  [97.9 F (36.6 C)-99.2 F (37.3 C)] 98.4 F (36.9 C) (02/08 1613) Pulse Rate:  [63-77] 64 (02/08 1613) Resp:  [14-22] 17 (02/08 1613) BP: (126-161)/(59-104) 137/88 (02/08 1613) SpO2:  [93 %-100 %]  98 % (02/08 1613) Weight:  [64.5 kg] 64.5 kg (02/08 0135)  General - Well nourished, well developed, in no apparent distress.  Ophthalmologic - fundi not visualized due to noncooperation.  Cardiovascular - Regular rate and rhythm.  Mental Status -  Level of arousal and orientation to time, place, and person were intact. Language including expression, naming, repetition, comprehension was assessed and found intact. Fund of Knowledge was assessed and was intact.  Cranial Nerves II - XII - II - Visual field intact OU. III, IV, VI - Extraocular movements intact. V -  Facial sensation intact bilaterally. VII - Facial movement intact bilaterally. VIII - Hearing & vestibular intact bilaterally. X - Palate elevates symmetrically. XI - Chin turning & shoulder shrug intact bilaterally. XII - Tongue protrusion intact.  Motor Strength - The patient's strength was normal in RUE and RLE, but LUE 4/5 proximal and 3/5 distal with finger dexterity.  Bulk was normal and fasciculations were absent.   Motor Tone - Muscle tone was assessed at the neck and appendages and was normal.  Reflexes - The patient's reflexes were symmetrical in all extremities and she had no pathological reflexes.  Sensory - Light touch, temperature/pinprick were assessed and were symmetrical.    Coordination - The patient had normal movements in the hands and feet with no ataxia or dysmetria.  Tremor was absent  Gait and Station - deferred.   ASSESSMENT/PLAN Mary Mercado is a 78 y.o. 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  Patient did receive TPA  Stroke - right MCA infarcts, embolic pattern, source unclear  Resultant distal>prox weakness in left hand  CT head neg  MRI head - right frontal parietal MCA region  CTA H&N - intracranial athero, severe Right P2 and mild bilat tICAs, severe left vert stenosis  2D Echo  - EF 60 - 65%. No cardiac source of emboli identified.   LE venous Doppler no DVT  Recommend TEE and loop recorder for further cardioembolic work-up  LDL - 914  HgbA1c - 5.9  UDS - neg  VTE prophylaxis - lovenox   81mg  ASA prior to admission, now on ASA 81 mg and plavix 75mg  daily. Continue DAPT for 3 weeks and then plavix alone.  Patient counseled to be compliant with her antithrombotic medications  Ongoing aggressive stroke risk factor management  Therapy recommendations:  CIR  Disposition:  Pending  Heart  palpitation  Patient stated that she had intermittent heart palpitation  Have not been worked up before  Given current embolic stroke, recommend loop recorder to rule out A. fib  Hypertension  Stable . Permissive hypertension (OK if < 180/110) but gradually normalize in 5-7 days . Long-term BP goal normotensive  Hyperlipidemia  Lipid lowering medication PTA:  none  LDL 117, goal < 70  Current lipid lowering medication: Lipitor 40mg   Continue statin at discharge  Other Stroke Risk Factors  Advanced age  Other Active Problems  none   Hospital day # 2  Rosalin Hawking, MD PhD Stroke Neurology 06/02/2018 6:48 PM  To contact Stroke Continuity provider, please refer to [email or web address] After hours, contact General Neurology

## 2018-06-02 NOTE — Progress Notes (Signed)
Physical Therapy Treatment Patient Details Name: Mary Mercado MRN: [id number] DOB: 04-Aug-1940 Today's Date: 06/02/2018    History of Present Illness Patient is a 78 y/o female presenting to the ED on 05/31/2018 with primary complaints of L sided decreased sensation along with N&V. CT of head did not show any blood and CTA was obtained.  CTA did not show any large vessel occlusion. 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 Small amount of hemorrhage in the right parietal infarct. Moderate chronic microvascular ischemic changes in the white matter and pons. tPA administered at 1306. PMH of vertigo.    PT Comments    Pt performed LE strengthening, transfers and gait training, pt is slow and guarded.  Mild dizziness reported post session, appears to be verigo.  Informed nursing.  Pt requiring moderate assistance +2 for safety.  Continues to be an excellent candidate for CIR therapies.  Pt remains open to CIR admission and is aware that she cannot have her care needs met at home at this time.     Follow Up Recommendations  CIR     Equipment Recommendations  Other (comment)(defer)    Recommendations for Other Services Rehab consult;OT consult     Precautions / Restrictions Precautions Precautions: Fall Restrictions Weight Bearing Restrictions: No    Mobility  Bed Mobility Overal bed mobility: Needs Assistance Bed Mobility: Rolling;Sidelying to Sit Rolling: Supervision Sidelying to sit: Min assist       General bed mobility comments: Min assistance to elevate trunk into sitting.    Transfers Overall transfer level: Needs assistance Equipment used: Rolling walker (2 wheeled)(required assistance on R wrist to keep staight with use.  ) Transfers: Sit to/from Stand Sit to Stand: Mod assist;+2 safety/equipment         General transfer comment: Cues for hand placement to and from seated surface.  Pt presents in flexed posturing and required assistance to extend hips  and knees.    Ambulation/Gait Ambulation/Gait assistance: Mod assist;+2 safety/equipment Gait Distance (Feet): 12 Feet Assistive device: Rolling walker (2 wheeled) Gait Pattern/deviations: Step-through pattern;Ataxic;Scissoring;Decreased stance time - left;Decreased dorsiflexion - left     General Gait Details: Pt required cues to ER of L hip.  L lateral lean noted with assistance to weight shift.  Close chair follow as patient fatigues quickly.     Stairs             Wheelchair Mobility    Modified Rankin (Stroke Patients Only)       Balance Overall balance assessment: Needs assistance Sitting-balance support: Single extremity supported;Feet supported Sitting balance-Leahy Scale: Poor       Standing balance-Leahy Scale: Poor                              Cognition Arousal/Alertness: Awake/alert Behavior During Therapy: WFL for tasks assessed/performed Overall Cognitive Status: Impaired/Different from baseline Area of Impairment: Following commands;Safety/judgement                       Following Commands: Follows one step commands inconsistently Safety/Judgement: Decreased awareness of safety;Decreased awareness of deficits            Exercises General Exercises - Lower Extremity Ankle Circles/Pumps: AROM;Both;10 reps;Supine Quad Sets: AROM;Both;10 reps;Supine Heel Slides: AROM;Both;10 reps;Supine;AAROM Hip ABduction/ADduction: AROM;Both;10 reps;Supine;AAROM Straight Leg Raises: AROM;Both;10 reps;Supine;AAROM Hip Flexion/Marching: AROM;Both;10 reps;Supine    General Comments        Pertinent Vitals/Pain Pain Assessment: No/denies  pain    Home Living                      Prior Function            PT Goals (current goals can now be found in the care plan section) Acute Rehab PT Goals Patient Stated Goal: "regain independence" Potential to Achieve Goals: Good Progress towards PT goals: Progressing toward goals     Frequency    Min 4X/week      PT Plan Current plan remains appropriate    Co-evaluation              AM-PAC PT "6 Clicks" Mobility   Outcome Measure  Help needed turning from your back to your side while in a flat bed without using bedrails?: A Little Help needed moving from lying on your back to sitting on the side of a flat bed without using bedrails?: A Little Help needed moving to and from a bed to a chair (including a wheelchair)?: A Lot Help needed standing up from a chair using your arms (e.g., wheelchair or bedside chair)?: A Lot Help needed to walk in hospital room?: Total Help needed climbing 3-5 steps with a railing? : Total 6 Click Score: 12    End of Session Equipment Utilized During Treatment: Gait belt Activity Tolerance: Patient tolerated treatment well;Treatment limited secondary to medical complications (Comment)(limited due to dizziness with positional changes) Patient left: in bed;with call bell/phone within reach;with bed alarm set;with family/visitor present Nurse Communication: Mobility status PT Visit Diagnosis: Unsteadiness on feet (R26.81);Other abnormalities of gait and mobility (R26.89);Muscle weakness (generalized) (M62.81)     Time: 1627-1700 PT Time Calculation (min) (ACUTE ONLY): 33 min  Charges:  $Gait Training: 8-22 mins $Therapeutic Exercise: 8-22 mins                     Governor Rooks, PTA Acute Rehabilitation Services Pager [phone] Office [phone]     Ashia Dehner Eli Hose 06/02/2018, 5:20 PM

## 2018-06-03 NOTE — Progress Notes (Signed)
STROKE TEAM PROGRESS NOTE   SUBJECTIVE (INTERVAL HISTORY) Her husband other families are at the bedside.  No acute event overnight.  Neuro stable.  Plan for TEE and loop tomorrow.   OBJECTIVE Vitals:   06/02/18 1921 06/02/18 2334 06/03/18 0325 06/03/18 0844  BP: 138/65 (!) 137/59 (!) 142/80 125/65  Pulse: 67 62 62 73  Resp: 16 15 16 18   Temp: 97.9 F (36.6 C) 98 F (36.7 C) 97.7 F (36.5 C) (!) 97.5 F (36.4 C)  TempSrc: Oral Oral Oral Axillary  SpO2: 100% 100% 100% 100%  Weight:      Height:        CBC:  Recent Labs  Lab 05/31/18 1205  WBC 6.1  NEUTROABS 4.1  HGB 14.5  HCT 45.1  MCV 90.6  PLT 916    Basic Metabolic Panel:  Recent Labs  Lab 05/31/18 1205  NA 139  K 4.6  CL 99  CO2 26  GLUCOSE 123*  BUN 13  CREATININE 0.74  CALCIUM 9.5    Lipid Panel:     Component Value Date/Time   CHOL 243 (H) 06/01/2018 0548   TRIG 80 06/01/2018 0548   HDL 110 06/01/2018 0548   CHOLHDL 2.2 06/01/2018 0548   VLDL 16 06/01/2018 0548   LDLCALC 117 (H) 06/01/2018 0548   HgbA1c:  Lab Results  Component Value Date   HGBA1C 5.9 (H) 06/01/2018   Urine Drug Screen:     Component Value Date/Time   LABOPIA NONE DETECTED 05/31/2018 1445   COCAINSCRNUR NONE DETECTED 05/31/2018 1445   LABBENZ NONE DETECTED 05/31/2018 1445   AMPHETMU NONE DETECTED 05/31/2018 1445   THCU NONE DETECTED 05/31/2018 1445   LABBARB NONE DETECTED 05/31/2018 1445    Alcohol Level     Component Value Date/Time   ETH <10 05/31/2018 1205    IMAGING  Mr Brain Wo Contrast 06/01/2018 Acute infarct right frontal parietal lobe. Small amount of hemorrhage in the right parietal infarct. Moderate chronic microvascular ischemic changes in the white matter and pons.   Ct Head Wo Contrast (charm Study) 05/31/2018 IMPRESSION:  No acute intracranial abnormality identified. Stable non contrast CT appearance of the brain from 1220 hours today.   CTA head and neck 1. No large vessel occlusion. 2.  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 3. Severe left vertebral artery origin stenosis. 4. Widely patent cervical carotid arteries. 5.  Aortic Atherosclerosis (ICD10-I70.0).  Transthoracic Echocardiogram  06/02/18 IMPRESSIONS  1. The left ventricle has normal systolic function of 38-46%. The cavity size was normal. There is no increased left ventricular wall thickness. Echo evidence of pseudonormalization in diastolic relaxation and indeterminate left ventricular filling pressure.  2. Grade 2 diastolic dysfunction.  3. The right ventricle has normal systolic function. The cavity was normal. There is no increase in right ventricular wall thickness.  4. Left atrial size was severely dilated.  5. Right atrial size was severely dilated.  6. The mitral valve is normal in structure.  7. The tricuspid valve is normal in structure. Tricuspid valve regurgitation is moderate.  8. The aortic valve is normal in structure. There is moderate thickening and moderate calcification of the aortic valve.  9. The pulmonic valve was normal in structure.   PHYSICAL EXAM  Temp:  [97.5 F (36.4 C)-98.4 F (36.9 C)] 97.5 F (36.4 C) (02/09 0844) Pulse Rate:  [62-77] 73 (02/09 0844) Resp:  [15-18] 18 (02/09 0844) BP: (125-142)/(59-88) 125/65 (02/09 0844) SpO2:  [97 %-100 %]  100 % (02/09 0844)  General - Well nourished, well developed, in no apparent distress.  Ophthalmologic - fundi not visualized due to noncooperation.  Cardiovascular - Regular rate and rhythm.  Mental Status -  Level of arousal and orientation to time, place, and person were intact. Language including expression, naming, repetition, comprehension was assessed and found intact. Fund of Knowledge was assessed and was intact.  Cranial Nerves II - XII - II - Visual field intact OU. III, IV, VI - Extraocular movements intact. V - Facial sensation intact bilaterally. VII -  Facial movement intact bilaterally. VIII - Hearing & vestibular intact bilaterally. X - Palate elevates symmetrically. XI - Chin turning & shoulder shrug intact bilaterally. XII - Tongue protrusion intact.  Motor Strength - The patient's strength was normal in RUE and RLE, but LUE 4/5 proximal and 3/5 distal with finger dexterity.  Bulk was normal and fasciculations were absent.   Motor Tone - Muscle tone was assessed at the neck and appendages and was normal.  Reflexes - The patient's reflexes were symmetrical in all extremities and she had no pathological reflexes.  Sensory - Light touch, temperature/pinprick were assessed and were symmetrical.    Coordination - The patient had normal movements in the hands and feet with no ataxia or dysmetria.  Tremor was absent  Gait and Station - deferred.   ASSESSMENT/PLAN [name] is a 78 y.o. 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  Patient did receive TPA  Stroke - right MCA infarcts, embolic pattern, source unclear  Resultant distal>prox weakness in left hand  CT head neg  MRI head - right frontal parietal MCA region  CTA H&N - intracranial athero, severe Right P2 and mild bilat tICAs, severe left vert stenosis  2D Echo  - EF 60 - 65%. No cardiac source of emboli identified.   LE venous Doppler no DVT  Recommend TEE and loop recorder for further cardioembolic work-up  LDL - 161  HgbA1c - 5.9  UDS - neg  VTE prophylaxis - lovenox   81mg  ASA prior to admission, now on ASA 81 mg and plavix 75mg  daily. Continue DAPT for 3 weeks and then plavix alone.  Patient counseled to be compliant with her antithrombotic medications  Ongoing aggressive stroke risk factor management  Therapy recommendations:  CIR  Disposition:  Pending  Heart palpitation  Patient stated that she had  intermittent heart palpitation  Have not been worked up before  Given current embolic stroke, recommend loop recorder to rule out A. fib  Hypertension  Stable . Permissive hypertension (OK if < 180/110) but gradually normalize in 5-7 days . Long-term BP goal normotensive  Hyperlipidemia  Lipid lowering medication PTA:  none  LDL 117, goal < 70  Current lipid lowering medication: Lipitor 40mg   Continue statin at discharge  Other Stroke Risk Factors  Advanced age  Other Active Problems  None  Hospital day # 3   Rosalin Hawking, MD PhD Stroke Neurology 06/03/2018 5:01 PM     To contact Stroke Continuity provider, please refer to [email or web address] After hours, contact General Neurology

## 2018-06-04 NOTE — Progress Notes (Signed)
    [facility] has been requested to perform a transesophageal echocardiogram on Mary Mercado for stroke.  After careful review of history and examination, the risks and benefits of transesophageal echocardiogram have been explained including risks of esophageal damage, perforation (1:10,000 risk), bleeding, pharyngeal hematoma as well as other potential complications associated with conscious sedation including aspiration, arrhythmia, respiratory failure and death. Alternatives to treatment were discussed, questions were answered. Patient is willing to proceed.   TEE scheduled for tomorrow 06/05/18 with Dr. Stanford Breed. NPO at [place] please.  [place], [place]  06/04/2018 4:50 PM

## 2018-06-04 NOTE — Progress Notes (Signed)
Physical Therapy Treatment Patient Details Name: Mary Mercado MRN: [id number] DOB: July 17, 1940 Today's Date: 06/04/2018    History of Present Illness Patient is a 78 y/o female presenting to the ED on 05/31/2018 with primary complaints of L sided decreased sensation along with N&V. CT of head did not show any blood and CTA was obtained.  CTA did not show any large vessel occlusion. 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 Small amount of hemorrhage in the right parietal infarct. Moderate chronic microvascular ischemic changes in the white matter and pons. tPA administered at 1306. PMH of vertigo.    PT Comments    Pt much improved from cognitive and mobility stand point. Pt cont with L sided inattention, significant impaired sensation on L UE and LE and cont to require mod/maxA for safe ambulation. Pt remains an excellent candidate for CIR Upon d/c for maximal functional recovery. Pt very motivated to return to independence.    Follow Up Recommendations  CIR     Equipment Recommendations  Other (comment)    Recommendations for Other Services Rehab consult;OT consult     Precautions / Restrictions Precautions Precautions: Fall Restrictions Weight Bearing Restrictions: No    Mobility  Bed Mobility Overal bed mobility: Needs Assistance Bed Mobility: Rolling;Sidelying to Sit;Sit to Sidelying Rolling: Min assist(to the Left) Sidelying to sit: Min assist     Sit to sidelying: Min assist General bed mobility comments: pt with difficulty using L UE and LE during transfer requiring max verbal cues to sequence and use L UE/LE functionally  Transfers Overall transfer level: Needs assistance Equipment used: Rolling walker (2 wheeled) Transfers: Sit to/from Stand Sit to Stand: Mod assist         General transfer comment: v/c's for hand placement, modA to maintain balance and midline posture  Ambulation/Gait Ambulation/Gait assistance: Mod assist;+2  safety/equipment Gait Distance (Feet): 120 Feet Assistive device: Rolling walker (2 wheeled) Gait Pattern/deviations: Step-through pattern;Ataxic(strong L Lateral lean) Gait velocity: slow   General Gait Details: pt with exaggerated stepping with L LE requiring max verbal cues to minimize step length, maintain L foot in neutral instead of internally rotated, pt with supination as well. Pt with onset of fatigue and L LE buckling requiring maxA to prevent fall and maintain upright standing. pt also requiring modA to maintain L UE gripped on walker. pt reports she can not feel with L UE and LE even with PT assisting L LE placement., pt requiring max verbal cues to shift to the R to step with the L, max tactile cues and modA to facilitate weight shifting   Stairs             Wheelchair Mobility    Modified Rankin (Stroke Patients Only) Modified Rankin (Stroke Patients Only) Pre-Morbid Rankin Score: No symptoms Modified Rankin: Moderately severe disability     Balance Overall balance assessment: Needs assistance Sitting-balance support: Feet supported;No upper extremity supported Sitting balance-Leahy Scale: Fair   Postural control: Left lateral lean Standing balance support: Bilateral upper extremity supported;During functional activity Standing balance-Leahy Scale: Poor                              Cognition Arousal/Alertness: Awake/alert Behavior During Therapy: WFL for tasks assessed/performed Overall Cognitive Status: Impaired/Different from baseline Area of Impairment: Safety/judgement;Problem solving;Awareness                       Following Commands: Follows one  step commands consistently Safety/Judgement: (L sided inattention) Awareness: Emergent Problem Solving: Difficulty sequencing;Requires verbal cues;Requires tactile cues General Comments: pt with impaired sensation on the L LE and UE impairing processing, sequencing, and causing  inattention      Exercises      General Comments General comments (skin integrity, edema, etc.): VSS      Pertinent Vitals/Pain Pain Assessment: No/denies pain    Home Living                      Prior Function            PT Goals (current goals can now be found in the care plan section) Acute Rehab PT Goals Patient Stated Goal: go to rehab  Progress towards PT goals: Progressing toward goals    Frequency    Min 4X/week      PT Plan Current plan remains appropriate    Co-evaluation              AM-PAC PT "6 Clicks" Mobility   Outcome Measure  Help needed turning from your back to your side while in a flat bed without using bedrails?: A Lot Help needed moving from lying on your back to sitting on the side of a flat bed without using bedrails?: A Lot Help needed moving to and from a bed to a chair (including a wheelchair)?: A Lot Help needed standing up from a chair using your arms (e.g., wheelchair or bedside chair)?: A Lot Help needed to walk in hospital room?: A Lot Help needed climbing 3-5 steps with a railing? : Total 6 Click Score: 11    End of Session Equipment Utilized During Treatment: Gait belt Activity Tolerance: Patient tolerated treatment well;Treatment limited secondary to medical complications (Comment) Patient left: in bed;with call bell/phone within reach;with bed alarm set;with family/visitor present Nurse Communication: Mobility status PT Visit Diagnosis: Unsteadiness on feet (R26.81);Other abnormalities of gait and mobility (R26.89);Muscle weakness (generalized) (M62.81)     Time: 1330-1402 PT Time Calculation (min) (ACUTE ONLY): 32 min  Charges:  $Gait Training: 8-22 mins $Neuromuscular Re-education: 8-22 mins                     Kittie Plater, PT, DPT Acute Rehabilitation Services Pager #: [phone] Office #: [phone]    Berline Lopes 06/04/2018, 2:26 PM

## 2018-06-04 NOTE — Care Management Important Message (Signed)
Important Message  Patient Details  Name: Mary Mercado MRN: [id number] Date of Birth: [birth year]   Medicare Important Message Given:  Yes    [name] 06/04/2018, 4:10 PM

## 2018-06-04 NOTE — Progress Notes (Addendum)
STROKE TEAM PROGRESS NOTE   SUBJECTIVE (INTERVAL HISTORY) Her husband and oldest granddaughter are at the bedside.  She is up in [employer], talkative. We discussed he dx and prognosis alone with TEE delay that is now on for tomorrow. Anticipated d/c afterwards.  OBJECTIVE Vitals:   06/03/18 1543 06/03/18 1941 06/04/18 0003 06/04/18 0334  BP: (!) 146/74 (!) 150/74 (!) 153/78 (!) 146/83  Pulse: 69 70 67 63  Resp: 18 18  18   Temp: 98.1 F (36.7 C) 98.2 F (36.8 C) 98.6 F (37 C) 97.6 F (36.4 C)  TempSrc: Oral Oral Oral Oral  SpO2: 100% 100% 98% 100%  Weight:      Height:        CBC:  Recent Labs  Lab 05/31/18 1205  WBC 6.1  NEUTROABS 4.1  HGB 14.5  HCT 45.1  MCV 90.6  PLT 235    Basic Metabolic Panel:  Recent Labs  Lab 05/31/18 1205  NA 139  K 4.6  CL 99  CO2 26  GLUCOSE 123*  BUN 13  CREATININE 0.74  CALCIUM 9.5    Lipid Panel:     Component Value Date/Time   CHOL 243 (H) 06/01/2018 0548   TRIG 80 06/01/2018 0548   HDL 110 06/01/2018 0548   CHOLHDL 2.2 06/01/2018 0548   VLDL 16 06/01/2018 0548   LDLCALC 117 (H) 06/01/2018 0548   HgbA1c:  Lab Results  Component Value Date   HGBA1C 5.9 (H) 06/01/2018   Urine Drug Screen:     Component Value Date/Time   LABOPIA NONE DETECTED 05/31/2018 1445   COCAINSCRNUR NONE DETECTED 05/31/2018 1445   LABBENZ NONE DETECTED 05/31/2018 1445   AMPHETMU NONE DETECTED 05/31/2018 1445   THCU NONE DETECTED 05/31/2018 1445   LABBARB NONE DETECTED 05/31/2018 1445    Alcohol Level     Component Value Date/Time   ETH <10 05/31/2018 1205    IMAGING  CT Head Code Stroke 05/31/2018 1. No acute finding by CT. 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 2. ASPECTS is 10.  CTA head and neck 1. No large vessel occlusion. 2. 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 3. Severe left vertebral artery origin stenosis. 4. Widely patent cervical  carotid arteries. 5.  Aortic Atherosclerosis (ICD10-I70.0).  Ct Head Wo Contrast (charm Study) 05/31/2018 IMPRESSION:  No acute intracranial abnormality identified. Stable non contrast CT appearance of the brain from 1220 hours today.   Mr Brain Wo Contrast 06/01/2018 Acute infarct right frontal parietal lobe. Small amount of hemorrhage in the right parietal infarct. Moderate chronic microvascular ischemic changes in the white matter and pons.   Transthoracic Echocardiogram  06/02/18 IMPRESSIONS  1. The left ventricle has normal systolic function of 36-14%. The cavity size was normal. There is no increased left ventricular wall thickness. Echo evidence of pseudonormalization in diastolic relaxation and indeterminate left ventricular filling pressure.  2. Grade 2 diastolic dysfunction.  3. The right ventricle has normal systolic function. The cavity was normal. There is no increase in right ventricular wall thickness.  4. Left atrial size was severely dilated.  5. Right atrial size was severely dilated.  6. The mitral valve is normal in structure.  7. The tricuspid valve is normal in structure. Tricuspid valve regurgitation is moderate.  8. The aortic valve is normal in structure. There is moderate thickening and moderate calcification of the aortic valve.  9. The pulmonic valve was normal in structure.   PHYSICAL EXAM General - Well  nourished, well developed, in no apparent distress. Cardiovascular - Regular rate and rhythm.  Mental Status -  Level of arousal and orientation to time, place, and person were intact. Language including expression, naming, repetition, comprehension was assessed and found intact. Fund of Knowledge was assessed and was intact.  Cranial Nerves II - XII - II - Visual field intact OU. III, IV, VI - Extraocular movements intact. V - Facial sensation intact bilaterally. VII - Facial movement intact bilaterally. VIII - Hearing & vestibular intact  bilaterally. X - Palate elevates symmetrically. XI - Chin turning & shoulder shrug intact bilaterally. XII - Tongue protrusion intact.  Motor Strength - The patient's strength was normal in RUE and RLE, but LUE 4+/5 proximal and 4/5 distal with finger dexterity. LLE 4/5.  Bulk was normal and fasciculations were absent.   Motor Tone - Muscle tone was assessed at the neck and appendages and was normal.  Sensory - Light touch, temperature/pinprick were assessed and were symmetrical.    Coordination - Decreased FMM L hand, no ataxia or dysmetria out of proportion to weakness.  Tremor was absent  Gait and Station - deferred.   ASSESSMENT/PLAN Ms. Donovan Gatchel is a 78 y.o. 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  Patient did receive TPA  Stroke - right MCA infarcts, embolic pattern, source unclear  Resultant distal>prox weakness in left hand  CT head neg  MRI head - right frontal parietal MCA region  CTA H&N - intracranial athero, severe Right P2 and mild bilat tICAs, severe left vert stenosis  2D Echo  - EF 60 - 65%. No cardiac source of emboli identified.   LE venous Doppler no DVT  TEE on schedule for 5/11 at 0830.   If TEE neg, EP to consult and place loop recorder to look for AF as source of stroke.  LDL - 117  HgbA1c - 5.9  UDS - neg  VTE prophylaxis - lovenox   81mg  ASA prior to admission, now on ASA 81 mg and plavix 75mg  daily. Continue DAPT for 3 weeks and then PLAVIX alone.  Patient counseled to be compliant with her antithrombotic medications  Therapy recommendations:  CIR  Disposition:  Pending  Anticipate ready for CIR after TEE / loop tomorrow  Heart palpitation  Patient stated that she had intermittent heart palpitation  Have not been worked up before  Given current embolic stroke, recommend loop recorder to  rule out A. fib  Hypertension  Stable . Permissive hypertension (OK if < 180/110) but gradually normalize in 5-7 days . Long-term BP goal normotensive  Hyperlipidemia  Lipid lowering medication PTA:  none  LDL 117, goal < 70  Current lipid lowering medication: Lipitor 40mg   Continue statin at discharge  Other Stroke Risk Factors  Advanced age  Other Active Problems  None  Hospital day # [place], MSN, APRN, ANVP-BC, AGPCNP-BC Advanced Practice Stroke Nurse [place] for Schedule & Pager information 06/04/2018 1:31 PM   ATTENDING NOTE: I reviewed above note and agree with the assessment and plan. Pt was seen and examined.   No acute event overnight, neuro stable.  Left hand clumsiness getting better.  TTE EF 60 to 65%.  Still pending TEE and loop recorder.  PT/OT recommend CIR.  Continue DAPT and Lipitor for stroke prevention.  Rosalin Hawking, MD PhD Stroke Neurology  06/04/2018 6:13 PM     To contact Stroke Continuity provider, please refer to [email or web address] After hours, contact General Neurology

## 2018-06-04 NOTE — Consult Note (Signed)
ELECTROPHYSIOLOGY CONSULT NOTE  Patient ID: Mary Mercado MRN: [id number], DOB/AGE: 1940-06-18   Admit date: 05/31/2018 Date of Consult: 06/04/2018  Primary Physician: [name], MD Primary Cardiologist: Dr. Acie Fredrickson (2017) Reason for Consultation: Cryptogenic stroke ; recommendations regarding Implantable Loop Recorder, requested by Dr. Erlinda Hong  History of Present Illness Mary Mercado was admitted on 05/31/2018 with stroke.  They first developed symptoms while at home, she did receive tPA    PMHx includes: Vertigo only .   Imaging demonstrated right MCA infarcts, embolic pattern, source unclear.  she has undergone workup for stroke including echocardiogram and carotid dopplers.  The patient has been monitored on telemetry which has demonstrated sinus rhythm with no arrhythmias.  Inpatient stroke work-up is to be completed with a TEE.   Echocardiogram this admission demonstrated  IMPRESSIONS  1. The left ventricle has normal systolic function of 34-19%. The cavity size was normal. There is no increased left ventricular wall thickness. Echo evidence of pseudonormalization in diastolic relaxation and indeterminate left ventricular filling  pressure.  2. Grade 2 diastolic dysfunction.  3. The right ventricle has normal systolic function. The cavity was normal. There is no increase in right ventricular wall thickness.  4. Left atrial size was severely dilated.  5. Right atrial size was severely dilated.  6. The mitral valve is normal in structure.  7. The tricuspid valve is normal in structure. Tricuspid valve regurgitation is moderate.  8. The aortic valve is normal in structure. There is moderate thickening and moderate calcification of the aortic valve.  9. The pulmonic valve was normal in structure.   Lab work is reviewed.  She was evaluated by Dr. Acie Fredrickson in 2017 for a single episode of palpitations (describeds as fast and irregular), self resolved, his note mentions SVT and  recommended vagal maneuver and propanolol PRN, also mentioned PVCs that were likely benign.  The patient reports that she has had ongoing palpitations since then though really few and far between until this year.  And particularly in the last 6 months with increasing frequency, perhaps of late a couple times a month.  They are random, a few minutes in duration and feel irregular to her.  No associated symptoms with them.   Prior to admission, the patient denies chest pain, shortness of breath, dizziness, or syncope.    They are recovering from their stroke with plans to CIR at discharge.   Past Medical History:  Diagnosis Date  . Allergy   . Vertigo, benign positional      Surgical History:  Past Surgical History:  Procedure Laterality Date  . Hemilaminectomy and microdiskectomy at L4-5 on the left.  12/10/2003     Medications Prior to Admission  Medication Sig Dispense Refill Last Dose  . aspirin 81 MG tablet Take 81 mg by mouth daily.   05/31/2018 at Unknown time    Inpatient Medications:  . aspirin EC  81 mg Oral Daily  . atorvastatin  40 mg Oral q1800  . clopidogrel  75 mg Oral Daily  . enoxaparin (LOVENOX) injection  40 mg Subcutaneous Q24H  . pantoprazole  40 mg Oral Daily    Allergies:  Allergies  Allergen Reactions  . Acetaminophen Rash    rash    Social History   Socioeconomic History  . Marital status: Married    Spouse name: Not on file  . Number of children: Not on file  . Years of education: Not on file  . Highest education level: Not on  file  Occupational History  . Not on file  Social Needs  . Financial resource strain: Not on file  . Food insecurity:    Worry: Not on file    Inability: Not on file  . Transportation needs:    Medical: Not on file    Non-medical: Not on file  Tobacco Use  . Smoking status: Never Smoker  . Smokeless tobacco: Never Used  Substance and Sexual Activity  . Alcohol use: No  . Drug use: No  . Sexual activity: Not  on file  Lifestyle  . Physical activity:    Days per week: Not on file    Minutes per session: Not on file  . Stress: Not on file  Relationships  . Social connections:    Talks on phone: Not on file    Gets together: Not on file    Attends religious service: Not on file    Active member of club or organization: Not on file    Attends meetings of clubs or organizations: Not on file    Relationship status: Not on file  . Intimate partner violence:    Fear of current or ex partner: Not on file    Emotionally abused: Not on file    Physically abused: Not on file    Forced sexual activity: Not on file  Other Topics Concern  . Not on file  Social History Narrative  . Not on file     Family History  Problem Relation Age of Onset  . Colon cancer Father   . Diabetes Mother   . Heart attack Mother        38  . Heart attack Brother 53      Review of Systems: All other systems reviewed and are otherwise negative except as noted above.  Physical Exam: Vitals:   06/03/18 1941 06/04/18 0003 06/04/18 0334 06/04/18 1056  BP: (!) 150/74 (!) 153/78 (!) 146/83 131/71  Pulse: 70 67 63 85  Resp: 18  18 15   Temp: 98.2 F (36.8 C) 98.6 F (37 C) 97.6 F (36.4 C) 98 F (36.7 C)  TempSrc: Oral Oral Oral Oral  SpO2: 100% 98% 100% 100%  Weight:      Height:        GEN- The patient is well appearing, alert and oriented x 3 today.   Head- normocephalic, atraumatic Eyes-  Sclera clear, conjunctiva pink Ears- hearing intact Oropharynx- clear Neck- supple Lungs- CTA b/l normal work of breathing Heart- RRR, extrasystoles, no murmurs, rubs or gallops  GI- soft, NT, ND Extremities- no clubbing, cyanosis, or edema MS- no significant deformity or atrophy Skin- no rash or lesion Psych- euthymic mood, full affect   Labs:   Lab Results  Component Value Date   WBC 6.1 05/31/2018   HGB 14.5 05/31/2018   HCT 45.1 05/31/2018   MCV 90.6 05/31/2018   PLT 236 05/31/2018    Recent  Labs  Lab 05/31/18 1205  NA 139  K 4.6  CL 99  CO2 26  BUN 13  CREATININE 0.74  CALCIUM 9.5  PROT 7.1  BILITOT 0.8  ALKPHOS 59  ALT 15  AST 21  GLUCOSE 123*   No results found for: CKTOTAL, CKMB, CKMBINDEX, TROPONINI Lab Results  Component Value Date   CHOL 243 (H) 06/01/2018   Lab Results  Component Value Date   HDL 110 06/01/2018   Lab Results  Component Value Date   LDLCALC 117 (H) 06/01/2018   Lab Results  Component Value Date   TRIG 80 06/01/2018   Lab Results  Component Value Date   CHOLHDL 2.2 06/01/2018   No results found for: LDLDIRECT  No results found for: DDIMER   Radiology/Studies:   Ct Angio Head W Or Wo Contrast Result Date: 05/31/2018 CLINICAL DATA:  Left hand and leg numbness. EXAM: CT ANGIOGRAPHY HEAD AND NECK TECHNIQUE: Multidetector CT imaging of the head and neck was performed using the standard protocol during bolus administration of intravenous contrast. Multiplanar CT image reconstructions and MIPs were obtained to evaluate the vascular anatomy. Carotid stenosis measurements (when applicable) are obtained utilizing NASCET criteria, using the distal internal carotid diameter as the denominator. CONTRAST:  59mL ISOVUE-370 IOPAMIDOL (ISOVUE-370) INJECTION 76% COMPARISON:  None. FINDINGS: CTA NECK FINDINGS Aortic arch: Standard 3 vessel aortic arch with moderate atherosclerotic plaque. No arch vessel origin stenosis. Right carotid system: Patent with predominantly soft plaque in the carotid bulb. No significant stenosis or dissection. Left carotid system: Patent with mild plaque at the carotid bifurcation. No significant stenosis or dissection. Vertebral arteries: Patent with the right vertebral artery being moderately dominant. Severe left vertebral artery origin stenosis. Skeleton: Reversal of the cervical lordosis with disc degeneration most severe at C5-6. Other neck: Asymmetric fatty replacement of the right submandibular gland. Small thyroid  nodules. Upper chest: Clear lung apices. Review of the MIP images confirms the above findings CTA HEAD FINDINGS Anterior circulation: The internal carotid arteries are patent from skull base to carotid termini with calcific atherosclerosis resulting in mild-to-moderate proximal supraclinoid stenosis bilaterally. ACAs and MCAs are patent without evidence of proximal branch occlusion or significant stenosis. No aneurysm is identified. Posterior circulation: The intracranial vertebral arteries are patent to the basilar with atherosclerotic irregularity bilaterally and multiple mild left V4 stenoses. Patent PICAs, AICAs, and SCAs are seen bilaterally. There may be a severe left AICA origin stenosis. The basilar artery is patent with mild narrowing distally. A moderately large left posterior communicating artery is present. The left P1 segment is mildly hypoplastic. There is a proximal right P2 stenosis. No aneurysm is identified. Venous sinuses: As permitted by contrast timing, patent. Anatomic variants: None. Review of the MIP images confirms the above findings IMPRESSION: 1. No large vessel occlusion. 2. 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 3. Severe left vertebral artery origin stenosis. 4. Widely patent cervical carotid arteries. 5.  Aortic Atherosclerosis (ICD10-I70.0). These results were called by telephone at the time of interpretation on 05/31/2018 at 12:45 pm to [facility] , who verbally acknowledged these results. Electronically Signed   By: Logan Bores M.D.   On: 05/31/2018 13:01    Mr Brain Wo Contrast Result Date: 06/01/2018 CLINICAL DATA:  Stroke follow-up. Left-sided neglect. TPA administered yesterday. EXAM: MRI HEAD WITHOUT CONTRAST TECHNIQUE: Multiplanar, multiecho pulse sequences of the brain and surrounding structures were obtained without intravenous contrast. COMPARISON:  CT head 05/31/2018 FINDINGS: Brain: Acute infarct in the right  parietal lobe. Small satellite areas of acute infarct in the right frontal and parietal lobe. Small area of hemorrhage in the right parietal infarct. Mild atrophy. Moderate chronic microvascular ischemic changes in the white matter and pons. Chronic microhemorrhage right cerebellum. Negative for mass lesion. Ventricle size normal. Vascular: Normal arterial flow voids Skull and upper cervical spine: Negative Sinuses/Orbits: Paranasal sinuses clear. Cataract surgery on the left. Other: None IMPRESSION: Acute infarct right frontal parietal lobe. Small amount of hemorrhage in the right parietal infarct. Moderate chronic microvascular ischemic changes in the white  matter and pons. These results will be called to the ordering clinician or representative by the Radiologist Assistant, and communication documented in the PACS or zVision Dashboard. Electronically Signed   By: Franchot Gallo M.D.   On: 06/01/2018 13:08    Ct Head Code Stroke Wo Contrast Result Date: 05/31/2018 CLINICAL DATA:  Code stroke. Focal neurological deficit. Clinical finding not specified. EXAM: CT HEAD WITHOUT CONTRAST TECHNIQUE: Contiguous axial images were obtained from the base of the skull through the vertex without intravenous contrast. COMPARISON:  None. FINDINGS: Brain: There are chronic small-vessel ischemic changes affecting the pons and the cerebral hemispheric white matter. There is no CT evidence of acute infarction, mass lesion, hemorrhage, hydrocephalus or extra-axial collection. Vascular: There is atherosclerotic calcification of the major vessels at the base of the brain. Skull: Negative Sinuses/Orbits: Clear/normal Other: None ASPECTS (Ada Stroke Program Early CT Score) - Ganglionic level infarction (caudate, lentiform nuclei, internal capsule, insula, M1-M3 cortex): 7 - Supraganglionic infarction (M4-M6 cortex): 3 Total score (0-10 with 10 being normal): 10 IMPRESSION: 1. No acute finding by CT. Chronic small-vessel ischemic  changes throughout the brain. 2. ASPECTS is 10. * These results were communicated to Dr. Cheral Marker at 12:28 pmon 2/6/2020by text page via the [facility] messaging system. Electronically Signed   By: Nelson Chimes M.D.   On: 05/31/2018 12:29    Vas [place] Lower Extremity Venous (dvt) Result Date: 06/02/2018  Lower Venous Study Indications: Stroke.  Performing Technologist: Abram Sander RVS  Examination Guidelines: A complete evaluation includes B-mode imaging, spectral Doppler, color Doppler, and power Doppler as needed of all accessible portions of each vessel. Bilateral testing is considered an integral part of a complete examination. Limited examinations for reoccurring indications may be performed as noted.  Right Venous Summary: Right: There is no evidence of deep vein thrombosis in the lower extremity. No cystic structure found in the popliteal fossa. Left: There is no evidence of deep vein thrombosis in the lower extremity. No cystic structure found in the popliteal fossa.  *See table(s) above for measurements and observations. Electronically signed by Monica Martinez MD on 06/02/2018 at 5:51:17 PM.    Final     12-lead ECG SR, very short PR All prior EKG's in EPIC reviewed with no documented atrial fibrillation  Telemetry SR, occ PVCs, occ-frequent PACs, some periods of bigeminal blocked PACs  Assessment and Plan:  1. Cryptogenic stroke The patient presents with cryptogenic stroke.  The patient has a TEE planned for this AM.  I spoke at length with the patient about monitoring for afib with either a 30 day event monitor or an implantable loop recorder.  Risks, benefits, and alteratives to implantable loop recorder were discussed with the patient today.     She has palpitations wth increasing frequency, to a couple times a month, she has at times frequent PACs as well.  She has been seen in our group back in 2017, I think the place to start is a event  Monitor with follow up with Dr. Acie Fredrickson.  I discussed  this with the patient and her husband, they are agreeable to this plan prior to proceeding with implantable loop recorder.   There is mention of a small amount of hemorrhage in the R parietal infarct, should AF be found early will need neurology input on a/c   Please call with questions.    Dyane Dustman, PA-C 06/04/2018

## 2018-06-04 NOTE — H&P (View-Only) (Signed)
STROKE TEAM PROGRESS NOTE   SUBJECTIVE (INTERVAL HISTORY) Her husband and oldest granddaughter are at the bedside.  She is up in [employer], talkative. We discussed he dx and prognosis alone with TEE delay that is now on for tomorrow. Anticipated d/c afterwards.  OBJECTIVE Vitals:   06/03/18 1543 06/03/18 1941 06/04/18 0003 06/04/18 0334  BP: (!) 146/74 (!) 150/74 (!) 153/78 (!) 146/83  Pulse: 69 70 67 63  Resp: 18 18  18   Temp: 98.1 F (36.7 C) 98.2 F (36.8 C) 98.6 F (37 C) 97.6 F (36.4 C)  TempSrc: Oral Oral Oral Oral  SpO2: 100% 100% 98% 100%  Weight:      Height:        CBC:  Recent Labs  Lab 05/31/18 1205  WBC 6.1  NEUTROABS 4.1  HGB 14.5  HCT 45.1  MCV 90.6  PLT 161    Basic Metabolic Panel:  Recent Labs  Lab 05/31/18 1205  NA 139  K 4.6  CL 99  CO2 26  GLUCOSE 123*  BUN 13  CREATININE 0.74  CALCIUM 9.5    Lipid Panel:     Component Value Date/Time   CHOL 243 (H) 06/01/2018 0548   TRIG 80 06/01/2018 0548   HDL 110 06/01/2018 0548   CHOLHDL 2.2 06/01/2018 0548   VLDL 16 06/01/2018 0548   LDLCALC 117 (H) 06/01/2018 0548   HgbA1c:  Lab Results  Component Value Date   HGBA1C 5.9 (H) 06/01/2018   Urine Drug Screen:     Component Value Date/Time   LABOPIA NONE DETECTED 05/31/2018 1445   COCAINSCRNUR NONE DETECTED 05/31/2018 1445   LABBENZ NONE DETECTED 05/31/2018 1445   AMPHETMU NONE DETECTED 05/31/2018 1445   THCU NONE DETECTED 05/31/2018 1445   LABBARB NONE DETECTED 05/31/2018 1445    Alcohol Level     Component Value Date/Time   ETH <10 05/31/2018 1205    IMAGING  CT Head Code Stroke 05/31/2018 1. No acute finding by CT. 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 2. ASPECTS is 10.  CTA head and neck 1. No large vessel occlusion. 2. 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 3. Severe left vertebral artery origin stenosis. 4. Widely patent cervical  carotid arteries. 5.  Aortic Atherosclerosis (ICD10-I70.0).  Ct Head Wo Contrast (charm Study) 05/31/2018 IMPRESSION:  No acute intracranial abnormality identified. Stable non contrast CT appearance of the brain from 1220 hours today.   Mr Brain Wo Contrast 06/01/2018 Acute infarct right frontal parietal lobe. Small amount of hemorrhage in the right parietal infarct. Moderate chronic microvascular ischemic changes in the white matter and pons.   Transthoracic Echocardiogram  06/02/18 IMPRESSIONS  1. The left ventricle has normal systolic function of 09-60%. The cavity size was normal. There is no increased left ventricular wall thickness. Echo evidence of pseudonormalization in diastolic relaxation and indeterminate left ventricular filling pressure.  2. Grade 2 diastolic dysfunction.  3. The right ventricle has normal systolic function. The cavity was normal. There is no increase in right ventricular wall thickness.  4. Left atrial size was severely dilated.  5. Right atrial size was severely dilated.  6. The mitral valve is normal in structure.  7. The tricuspid valve is normal in structure. Tricuspid valve regurgitation is moderate.  8. The aortic valve is normal in structure. There is moderate thickening and moderate calcification of the aortic valve.  9. The pulmonic valve was normal in structure.   PHYSICAL EXAM General - Well  nourished, well developed, in no apparent distress. Cardiovascular - Regular rate and rhythm.  Mental Status -  Level of arousal and orientation to time, place, and person were intact. Language including expression, naming, repetition, comprehension was assessed and found intact. Fund of Knowledge was assessed and was intact.  Cranial Nerves II - XII - II - Visual field intact OU. III, IV, VI - Extraocular movements intact. V - Facial sensation intact bilaterally. VII - Facial movement intact bilaterally. VIII - Hearing & vestibular intact  bilaterally. X - Palate elevates symmetrically. XI - Chin turning & shoulder shrug intact bilaterally. XII - Tongue protrusion intact.  Motor Strength - The patient's strength was normal in RUE and RLE, but LUE 4+/5 proximal and 4/5 distal with finger dexterity. LLE 4/5.  Bulk was normal and fasciculations were absent.   Motor Tone - Muscle tone was assessed at the neck and appendages and was normal.  Sensory - Light touch, temperature/pinprick were assessed and were symmetrical.    Coordination - Decreased FMM L hand, no ataxia or dysmetria out of proportion to weakness.  Tremor was absent  Gait and Station - deferred.   ASSESSMENT/PLAN [name] is a 78 y.o. 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  Patient did receive TPA  Stroke - right MCA infarcts, embolic pattern, source unclear  Resultant distal>prox weakness in left hand  CT head neg  MRI head - right frontal parietal MCA region  CTA H&N - intracranial athero, severe Right P2 and mild bilat tICAs, severe left vert stenosis  2D Echo  - EF 60 - 65%. No cardiac source of emboli identified.   LE venous Doppler no DVT  TEE on schedule for 5/11 at 0830.   If TEE neg, EP to consult and place loop recorder to look for AF as source of stroke.  LDL - 117  HgbA1c - 5.9  UDS - neg  VTE prophylaxis - lovenox   81mg  ASA prior to admission, now on ASA 81 mg and plavix 75mg  daily. Continue DAPT for 3 weeks and then PLAVIX alone.  Patient counseled to be compliant with her antithrombotic medications  Therapy recommendations:  CIR  Disposition:  Pending  Anticipate ready for CIR after TEE / loop tomorrow  Heart palpitation  Patient stated that she had intermittent heart palpitation  Have not been worked up before  Given current embolic stroke, recommend loop recorder to  rule out A. fib  Hypertension  Stable . Permissive hypertension (OK if < 180/110) but gradually normalize in 5-7 days . Long-term BP goal normotensive  Hyperlipidemia  Lipid lowering medication PTA:  none  LDL 117, goal < 70  Current lipid lowering medication: Lipitor 40mg   Continue statin at discharge  Other Stroke Risk Factors  Advanced age  Other Active Problems  None  Hospital day # [place], MSN, APRN, ANVP-BC, AGPCNP-BC Advanced Practice Stroke Nurse [place] for Schedule & Pager information 06/04/2018 1:31 PM   ATTENDING NOTE: I reviewed above note and agree with the assessment and plan. Pt was seen and examined.   No acute event overnight, neuro stable.  Left hand clumsiness getting better.  TTE EF 60 to 65%.  Still pending TEE and loop recorder.  PT/OT recommend CIR.  Continue DAPT and Lipitor for stroke prevention.  Rosalin Hawking, MD PhD Stroke Neurology  06/04/2018 6:13 PM     To contact Stroke Continuity provider, please refer to [email or web address] After hours, contact General Neurology

## 2018-06-04 NOTE — Progress Notes (Signed)
Occupational Therapy Treatment Patient Details Name: Mary Mercado MRN: [id number] DOB: 11/02/1940 Today's Date: 06/04/2018    History of present illness Patient is a 78 y/o female presenting to the ED on 05/31/2018 with primary complaints of L sided decreased sensation along with N&V. CT of head did not show any blood and CTA was obtained.  CTA did not show any large vessel occlusion. 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 Small amount of hemorrhage in the right parietal infarct. Moderate chronic microvascular ischemic changes in the white matter and pons. tPA administered at 1306. PMH of vertigo.   OT comments  Pt completes grooming tasks in standing with min assist.  She needs mod assist for functional mobility and stand pivot transfers without assistive device.  Decreased proprioception is noted in the LLE and LUE at this time as well as decreased FM coordination.  Feel she is still an excellent candidate for further rehab services at CIR level.  Will continue to follow in acute care.  Follow Up Recommendations  CIR;Supervision/Assistance - 24 hour    Equipment Recommendations  Other (comment)(TBD next venue of care)       Precautions / Restrictions Precautions Precautions: Fall Restrictions Weight Bearing Restrictions: No       Mobility Bed Mobility Overal bed mobility: Needs Assistance Bed Mobility: Supine to Sit;Sit to Supine Rolling: Supervision Sidelying to sit: Supervision;HOB elevated   Sit to supine: Min assist Sit to sidelying: Min assist General bed mobility comments: Pt needed min assist for lifting the LLE into the bed when laying back down.  Transfers Overall transfer level: Needs assistance Equipment used: 1 person hand held assist Transfers: Sit to/from Stand Sit to Stand: Mod assist         General transfer comment: Pt with decreased proprioception in the LLE resulting in knee buckling without awareness when standing at the sink as well  as stepping too far with the LLEduring mobility     Balance Overall balance assessment: Needs assistance Sitting-balance support: Feet supported;No upper extremity supported Sitting balance-Leahy Scale: Good   Postural control: Left lateral lean Standing balance support: During functional activity Standing balance-Leahy Scale: Poor Standing balance comment: Mod assist needed for dynamic standing balance without assistive device.                            ADL either performed or assessed with clinical judgement   ADL Overall ADL's : Needs assistance/impaired     Grooming: Oral care;Brushing hair;Standing;Minimal assistance                                 General ADL Comments: Pt able to transfer to the EOB with supervison to start session, using rail and HOB elevated 30 degrees.  Once sitting had her work on [employer] tasks and functional reaching with the LUE.  Pt exhibits sensory deficits in the LUE as well as decreased FM coordination for removing tops from items or for using bilaterally for folding a bed pad.  Educated pt and family on FM coordination tasks for the LUE.                 Cognition Arousal/Alertness: Awake/alert Behavior During Therapy: WFL for tasks assessed/performed Overall Cognitive Status: Impaired/Different from baseline Area of Impairment: Safety/judgement;Awareness  Following Commands: Follows one step commands consistently Safety/Judgement: Decreased awareness of deficits(Proprioceptive deficits in the RUE and RLE) Awareness: Intellectual Problem Solving: Requires verbal cues General Comments: pt with impaired sensation on the L LE and UE impairing processing, sequencing, and causing inattention              General Comments VSS    Pertinent Vitals/ Pain       Pain Assessment: No/denies pain     Prior Functioning/Environment              Frequency  Min 3X/week        Progress  Toward Goals  OT Goals(current goals can now be found in the care plan section)  Progress towards OT goals: Progressing toward goals  Acute Rehab OT Goals Patient Stated Goal: go to rehab   Plan Discharge plan remains appropriate       AM-PAC OT "6 Clicks" Daily Activity     Outcome Measure   Help from another person eating meals?: A Little Help from another person taking care of personal grooming?: A Little Help from another person toileting, which includes using toliet, bedpan, or urinal?: A Lot Help from another person bathing (including washing, rinsing, drying)?: A Lot Help from another person to put on and taking off regular upper body clothing?: A Little Help from another person to put on and taking off regular lower body clothing?: A Lot 6 Click Score: 15    End of Session    OT Visit Diagnosis: Muscle weakness (generalized) (M62.81);Hemiplegia and hemiparesis;Unsteadiness on feet (R26.81) Hemiplegia - Right/Left: Left Hemiplegia - dominant/non-dominant: Non-Dominant Hemiplegia - caused by: Cerebral infarction   Activity Tolerance Patient limited by fatigue   Patient Left in bed;with call bell/phone within reach;with bed alarm set;with family/visitor present   Nurse Communication Mobility status        Time: 3614-4315 OT Time Calculation (min): 35 min  Charges: OT General Charges $OT Visit: 1 Visit OT Treatments $Self Care/Home Management : 8-22 mins $Neuromuscular Re-education: 8-22 mins     Jakiya Bookbinder OTR/L 06/04/2018, 3:13 PM

## 2018-06-05 ENCOUNTER — Encounter (HOSPITAL_COMMUNITY): Payer: Self-pay

## 2018-06-05 ENCOUNTER — Inpatient Hospital Stay (HOSPITAL_COMMUNITY): Payer: Medicare Other

## 2018-06-05 ENCOUNTER — Other Ambulatory Visit: Payer: Self-pay | Admitting: Physician Assistant

## 2018-06-05 ENCOUNTER — Other Ambulatory Visit: Payer: Self-pay

## 2018-06-05 ENCOUNTER — Encounter (HOSPITAL_COMMUNITY): Payer: Self-pay | Admitting: *Deleted

## 2018-06-05 ENCOUNTER — Inpatient Hospital Stay (HOSPITAL_COMMUNITY)
Admission: RE | Admit: 2018-06-05 | Discharge: 2018-06-16 | DRG: 057 | Disposition: A | Discharge status: Home / Self Care | Payer: Medicare Other | Source: Intra-hospital | Attending: Physical Medicine & Rehabilitation | Admitting: Physical Medicine & Rehabilitation

## 2018-06-05 ENCOUNTER — Encounter (HOSPITAL_COMMUNITY)
Admission: EM | Disposition: A | Discharge status: Rehabilitation Facility | Payer: Self-pay | Source: Home / Self Care | Attending: Neurology

## 2018-06-05 DIAGNOSIS — Z833 Family history of diabetes mellitus: Secondary | ICD-10-CM | POA: Diagnosis not present

## 2018-06-05 DIAGNOSIS — I10 Essential (primary) hypertension: Secondary | ICD-10-CM | POA: Diagnosis present

## 2018-06-05 DIAGNOSIS — R002 Palpitations: Secondary | ICD-10-CM | POA: Diagnosis present

## 2018-06-05 DIAGNOSIS — E785 Hyperlipidemia, unspecified: Secondary | ICD-10-CM | POA: Diagnosis present

## 2018-06-05 DIAGNOSIS — R42 Dizziness and giddiness: Secondary | ICD-10-CM | POA: Diagnosis present

## 2018-06-05 DIAGNOSIS — I700 Atherosclerosis of aorta: Secondary | ICD-10-CM | POA: Diagnosis present

## 2018-06-05 DIAGNOSIS — I63511 Cerebral infarction due to unspecified occlusion or stenosis of right middle cerebral artery: Secondary | ICD-10-CM | POA: Diagnosis not present

## 2018-06-05 DIAGNOSIS — I69354 Hemiplegia and hemiparesis following cerebral infarction affecting left non-dominant side: Principal | ICD-10-CM

## 2018-06-05 DIAGNOSIS — L821 Other seborrheic keratosis: Secondary | ICD-10-CM | POA: Diagnosis present

## 2018-06-05 DIAGNOSIS — Y92239 Unspecified place in hospital as the place of occurrence of the external cause: Secondary | ICD-10-CM | POA: Diagnosis present

## 2018-06-05 DIAGNOSIS — Z8249 Family history of ischemic heart disease and other diseases of the circulatory system: Secondary | ICD-10-CM

## 2018-06-05 DIAGNOSIS — S40022A Contusion of left upper arm, initial encounter: Secondary | ICD-10-CM | POA: Diagnosis present

## 2018-06-05 DIAGNOSIS — Z7982 Long term (current) use of aspirin: Secondary | ICD-10-CM | POA: Diagnosis not present

## 2018-06-05 DIAGNOSIS — R414 Neurologic neglect syndrome: Secondary | ICD-10-CM | POA: Diagnosis not present

## 2018-06-05 DIAGNOSIS — R209 Unspecified disturbances of skin sensation: Secondary | ICD-10-CM | POA: Diagnosis not present

## 2018-06-05 DIAGNOSIS — R7303 Prediabetes: Secondary | ICD-10-CM | POA: Diagnosis present

## 2018-06-05 DIAGNOSIS — I672 Cerebral atherosclerosis: Secondary | ICD-10-CM | POA: Diagnosis present

## 2018-06-05 DIAGNOSIS — R208 Other disturbances of skin sensation: Secondary | ICD-10-CM | POA: Diagnosis not present

## 2018-06-05 DIAGNOSIS — R51 Headache: Secondary | ICD-10-CM

## 2018-06-05 DIAGNOSIS — Y848 Other medical procedures as the cause of abnormal reaction of the patient, or of later complication, without mention of misadventure at the time of the procedure: Secondary | ICD-10-CM | POA: Diagnosis present

## 2018-06-05 DIAGNOSIS — Z800 Family history of malignant neoplasm of digestive organs: Secondary | ICD-10-CM

## 2018-06-05 DIAGNOSIS — R519 Headache, unspecified: Secondary | ICD-10-CM

## 2018-06-05 DIAGNOSIS — I6523 Occlusion and stenosis of bilateral carotid arteries: Secondary | ICD-10-CM | POA: Diagnosis present

## 2018-06-05 DIAGNOSIS — I69312 Visuospatial deficit and spatial neglect following cerebral infarction: Secondary | ICD-10-CM

## 2018-06-05 DIAGNOSIS — I69398 Other sequelae of cerebral infarction: Secondary | ICD-10-CM | POA: Diagnosis not present

## 2018-06-05 DIAGNOSIS — Z888 Allergy status to other drugs, medicaments and biological substances status: Secondary | ICD-10-CM

## 2018-06-05 DIAGNOSIS — I639 Cerebral infarction, unspecified: Secondary | ICD-10-CM

## 2018-06-05 DIAGNOSIS — I361 Nonrheumatic tricuspid (valve) insufficiency: Secondary | ICD-10-CM

## 2018-06-05 HISTORY — PX: TEE WITHOUT CARDIOVERSION: SHX5443

## 2018-06-05 SURGERY — ECHOCARDIOGRAM, TRANSESOPHAGEAL
Anesthesia: Moderate Sedation

## 2018-06-05 MED ORDER — MIDAZOLAM HCL (PF) 10 MG/2ML IJ SOLN
INTRAMUSCULAR | Status: DC | PRN
  Administered 2018-06-05 (×2): 2 mg via INTRAVENOUS

## 2018-06-05 MED ORDER — ENOXAPARIN SODIUM 40 MG/0.4ML ~~LOC~~ SOLN
40.0000 mg | SUBCUTANEOUS | Status: DC
  Administered 2018-06-05 – 2018-06-15 (×11): 40 mg via SUBCUTANEOUS
  Filled 2018-06-05 (×10): qty 0.4

## 2018-06-05 MED ORDER — ALUM & MAG HYDROXIDE-SIMETH 200-200-20 MG/5ML PO SUSP: 30.0000 mL | ORAL | Status: DC | PRN

## 2018-06-05 MED ORDER — FENTANYL CITRATE (PF) 100 MCG/2ML IJ SOLN
INTRAMUSCULAR | Status: DC | PRN
  Administered 2018-06-05 (×2): 25 ug via INTRAVENOUS

## 2018-06-05 MED ORDER — TRAMADOL HCL 50 MG PO TABS: 50.0000 mg | ORAL_TABLET | Freq: Four times a day (QID) | ORAL | Status: DC | PRN

## 2018-06-05 MED ORDER — FENTANYL CITRATE (PF) 100 MCG/2ML IJ SOLN
INTRAMUSCULAR | Status: AC
  Filled 2018-06-05: qty 2

## 2018-06-05 MED ORDER — ENOXAPARIN SODIUM 40 MG/0.4ML ~~LOC~~ SOLN: 40.0000 mg | SUBCUTANEOUS | Status: DC

## 2018-06-05 MED ORDER — ATORVASTATIN CALCIUM 40 MG PO TABS
40.0000 mg | ORAL_TABLET | Freq: Every day | ORAL | Status: DC
  Administered 2018-06-05 – 2018-06-15 (×11): 40 mg via ORAL
  Filled 2018-06-05 (×11): qty 1

## 2018-06-05 MED ORDER — MIDAZOLAM HCL (PF) 5 MG/ML IJ SOLN
INTRAMUSCULAR | Status: AC
  Filled 2018-06-05: qty 2

## 2018-06-05 MED ORDER — POLYETHYLENE GLYCOL 3350 17 G PO PACK: 17.0000 g | PACK | Freq: Every day | ORAL | Status: DC | PRN

## 2018-06-05 MED ORDER — PROCHLORPERAZINE MALEATE 5 MG PO TABS: 5.0000 mg | ORAL_TABLET | Freq: Four times a day (QID) | ORAL | Status: DC | PRN

## 2018-06-05 MED ORDER — SODIUM CHLORIDE (PF) 0.9 % IJ SOLN
INTRAMUSCULAR | Status: DC | PRN
  Administered 2018-06-05: 9 mL via INTRAVENOUS

## 2018-06-05 MED ORDER — FLEET ENEMA 7-19 GM/118ML RE ENEM: 1.0000 | ENEMA | Freq: Once | RECTAL | Status: DC | PRN

## 2018-06-05 MED ORDER — GUAIFENESIN-DM 100-10 MG/5ML PO SYRP: 5.0000 mL | ORAL_SOLUTION | Freq: Four times a day (QID) | ORAL | Status: DC | PRN

## 2018-06-05 MED ORDER — BISACODYL 10 MG RE SUPP: 10.0000 mg | Freq: Every day | RECTAL | Status: DC | PRN

## 2018-06-05 MED ORDER — PROCHLORPERAZINE 25 MG RE SUPP: 12.5000 mg | Freq: Four times a day (QID) | RECTAL | Status: DC | PRN

## 2018-06-05 MED ORDER — CLOPIDOGREL BISULFATE 75 MG PO TABS
75.0000 mg | ORAL_TABLET | Freq: Every day | ORAL | Status: DC
  Administered 2018-06-06 – 2018-06-11 (×6): 75 mg via ORAL
  Filled 2018-06-05 (×6): qty 1

## 2018-06-05 MED ORDER — CLOPIDOGREL BISULFATE 75 MG PO TABS: 75.0000 mg | ORAL_TABLET | Freq: Every day | ORAL | Status: DC

## 2018-06-05 MED ORDER — BUTAMBEN-TETRACAINE-BENZOCAINE 2-2-14 % EX AERO
INHALATION_SPRAY | CUTANEOUS | Status: DC | PRN
  Administered 2018-06-05: 2 via TOPICAL

## 2018-06-05 MED ORDER — PROCHLORPERAZINE EDISYLATE 10 MG/2ML IJ SOLN: 5.0000 mg | Freq: Four times a day (QID) | INTRAMUSCULAR | Status: DC | PRN

## 2018-06-05 MED ORDER — ACETAMINOPHEN 325 MG PO TABS: 325.0000 mg | ORAL_TABLET | ORAL | Status: DC | PRN

## 2018-06-05 MED ORDER — PANTOPRAZOLE SODIUM 40 MG PO TBEC
40.0000 mg | DELAYED_RELEASE_TABLET | Freq: Every day | ORAL | Status: DC
  Administered 2018-06-06 – 2018-06-16 (×11): 40 mg via ORAL
  Filled 2018-06-05 (×11): qty 1

## 2018-06-05 MED ORDER — TRAZODONE HCL 50 MG PO TABS: 25.0000 mg | ORAL_TABLET | Freq: Every evening | ORAL | Status: DC | PRN

## 2018-06-05 MED ORDER — SODIUM CHLORIDE 0.9 % IV SOLN: INTRAVENOUS | Status: DC

## 2018-06-05 MED ORDER — ATORVASTATIN CALCIUM 40 MG PO TABS: 40.0000 mg | ORAL_TABLET | Freq: Every day | ORAL | Status: DC

## 2018-06-05 MED ORDER — DIPHENHYDRAMINE HCL 12.5 MG/5ML PO ELIX
12.5000 mg | ORAL_SOLUTION | Freq: Four times a day (QID) | ORAL | Status: DC | PRN
  Administered 2018-06-08: 25 mg via ORAL
  Filled 2018-06-05: qty 10

## 2018-06-05 MED ORDER — ASPIRIN EC 81 MG PO TBEC
81.0000 mg | DELAYED_RELEASE_TABLET | Freq: Every day | ORAL | Status: DC
  Administered 2018-06-06 – 2018-06-16 (×11): 81 mg via ORAL
  Filled 2018-06-05 (×11): qty 1

## 2018-06-05 NOTE — Interval H&P Note (Deleted)
History and Physical Interval Note:  06/05/2018 8:21 AM  Mary Mercado  has presented today for surgery, with the diagnosis of STROKE  The various methods of treatment have been discussed with the patient and family. After consideration of risks, benefits and other options for treatment, the patient has consented to  Procedure(s) with comments: TRANSESOPHAGEAL ECHOCARDIOGRAM (TEE) (N/A) - loop as a surgical intervention .  The patient's history has been reviewed, patient examined, no change in status, stable for surgery.  I have reviewed the patient's chart and labs.  Questions were answered to the patient's satisfaction.     Kirk Ruths

## 2018-06-05 NOTE — Care Management Note (Signed)
Case Management Note  Patient Details  Name: Mary Mercado MRN: [id number] Date of Birth: 10-03-1940  Subjective/Objective:                    Action/Plan: Pt discharging to CIR today. CM signing off.  Expected Discharge Date:  06/05/18               Expected Discharge Plan:  [place]  In-House Referral:     Discharge planning Services  CM Consult  Post Acute Care Choice:    Choice offered to:     DME Arranged:    DME Agency:     HH Arranged:    HH Agency:     Status of Service:  Completed, signed off  If discussed at [employer], dates discussed:    Additional Comments:  Pollie Friar, RN 06/05/2018, 12:38 PM

## 2018-06-05 NOTE — H&P (Signed)
Physical Medicine and Rehabilitation Admission H&P    Chief Complaint  Patient presents with  . Functional deficits due to stroke  . Numbness    HPI: Mary Mercado is a 78 year old female with history of allergies, BPV, palpatations  otherwise in good health; who was admitted on 05/31/18 LUE weakness, numbness LUE/LLE and dizziness. UDS negative.  CT head negative and she receive tPA. CTA head neck showed intracranial atherosclerosis severe right P2 and mild to moderate B-ICA stenosis and no large vessel occlusion. MRI brian done revealing acute right frontal parietal lobe infarct with small amount of hemorrhage in right parietal infarct and moderate small vessel disease. BLE dopplers were negative for DVT. TEE done revealing EF 60-65% with severely dilated right and left atrium, moderate TV prolapse, plaque in descending aorta, sclerotic AV and no LAA thrombus.  Dizziness and nausea has resolved but patient continues to be limited by left sensory deficits with decreased in coordination and mild weakness. Therapy ongoing and CIR recommended due to functional decline. Neurology recommends 30 day event monitor due to history of palpitations---if negative will need loop recorder to rule out A fib.  Dr. 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      Review of Systems  Constitutional: Negative for chills and fever.  HENT: Negative for hearing loss and tinnitus.   Eyes: Negative for blurred vision and double vision.  Respiratory: Negative for cough and hemoptysis.   Cardiovascular: Negative for chest pain and palpitations.  Gastrointestinal: Negative for constipation, heartburn and nausea.  Genitourinary: Negative for dysuria and urgency.  Musculoskeletal: Negative for back pain, myalgias and neck pain.  Skin: Negative for rash.  Neurological: Positive for dizziness, focal weakness and headaches.     Past Medical  History:  Diagnosis Date  . Allergy   . Vertigo, benign positional     Past Surgical History:  Procedure Laterality Date  . Hemilaminectomy and microdiskectomy at L4-5 on the left.  12/10/2003    Family History  Problem Relation Age of Onset  . Colon cancer Father   . Diabetes Mother   . Heart attack Mother        8  . Heart attack Brother 45    Social History:  Married. Retired Publishing copy. Independent without AD--loves sports, sings in 2 choirs and volunteers at church.  She reports that she has never smoked. She has never used smokeless tobacco. She reports that she does not drink alcohol or use drugs.   Allergies  Allergen Reactions  . Acetaminophen Rash    rash    Medications Prior to Admission  Medication Sig Dispense Refill  . aspirin 81 MG tablet Take 81 mg by mouth daily.      Drug Regimen Review  Drug regimen was reviewed and remains appropriate with no significant issues identified  Home: Home Living Family/patient expects to be discharged to:: Private residence Living Arrangements: Spouse/significant other Available Help at Discharge: Family, [place] Type of Home: House Home Access: Level entry Home Layout: One level Bathroom Shower/Tub: Multimedia programmer: Handicapped height Additional Comments: reports fully handicapped bathroom  Lives With: Spouse   Functional History: Prior Function Level of Independence: Independent Comments: active in the church and home  Functional Status:  Mobility: Bed Mobility Overal bed mobility: Needs Assistance Bed Mobility: Supine to Sit, Sit to Supine Rolling: Supervision Sidelying to sit: Supervision, HOB elevated Supine to  sit: Min assist Sit to supine: Min assist Sit to sidelying: Min assist General bed mobility comments: Pt needed min assist for lifting the LLE into the bed when laying back down. Transfers Overall transfer level: Needs assistance Equipment used: 1 person  hand held assist Transfers: Sit to/from Stand Sit to Stand: Mod assist General transfer comment: Pt with decreased proprioception in the LLE resulting in knee buckling without awareness when standing at the sink as well as stepping too far with the [place] mobility  Ambulation/Gait Ambulation/Gait assistance: Mod assist, +2 safety/equipment Gait Distance (Feet): 120 Feet Assistive device: Rolling walker (2 wheeled) Gait Pattern/deviations: Step-through pattern, Ataxic(strong L Lateral lean) General Gait Details: pt with exaggerated stepping with L LE requiring max verbal cues to minimize step length, maintain L foot in neutral instead of internally rotated, pt with supination as well. Pt with onset of fatigue and L LE buckling requiring maxA to prevent fall and maintain upright standing. pt also requiring modA to maintain L UE gripped on walker. pt reports she can not feel with L UE and LE even with PT assisting L LE placement., pt requiring max verbal cues to shift to the R to step with the L, max tactile cues and modA to facilitate weight shifting Gait velocity: slow    ADL: ADL Overall ADL's : Needs assistance/impaired Eating/Feeding: Set up, Minimal assistance, Sitting Grooming: Oral care, Brushing hair, Standing, Minimal assistance Upper Body Bathing: Minimal assistance, Sitting, Bed level Upper Body Dressing : Minimal assistance, Sitting, Bed level General ADL Comments: Pt able to transfer to the EOB with supervison to start session, using rail and HOB elevated 30 degrees.  Once sitting had her work on [employer] tasks and functional reaching with the LUE.  Pt exhibits sensory deficits in the LUE as well as decreased FM coordination for removing tops from items or for using bilaterally for folding a bed pad.  Educated pt and family on FM coordination tasks for the LUE.    Cognition: Cognition Overall Cognitive Status: Impaired/Different from baseline Arousal/Alertness:  Awake/alert Orientation Level: Oriented X4 Attention: Focused, Sustained Focused Attention: Appears intact Sustained Attention: Appears intact Memory: Appears intact Awareness: Appears intact Problem Solving: Appears intact Executive Function: Reasoning, Sequencing Reasoning: Appears intact Sequencing: Appears intact Safety/Judgment: Appears intact Cognition Arousal/Alertness: Awake/alert Behavior During Therapy: WFL for tasks assessed/performed Overall Cognitive Status: Impaired/Different from baseline Area of Impairment: Safety/judgement, Awareness Following Commands: Follows one step commands consistently Safety/Judgement: Decreased awareness of deficits(Proprioceptive deficits in the RUE and RLE) Awareness: Intellectual Problem Solving: Requires verbal cues General Comments: pt with impaired sensation on the L LE and UE impairing processing, sequencing, and causing inattention   Blood pressure 122/62, pulse 60, temperature 97.7 F (36.5 C), temperature source Oral, resp. rate (!) 23, height 5\' 5"  (1.651 m), weight 64.5 kg, SpO2 98 %. Physical Exam  Nursing note and vitals reviewed. Constitutional: She is oriented to person, place, and time. She appears well-developed and well-nourished.  HENT:  Head: Normocephalic and atraumatic.  Eyes: Pupils are equal, round, and reactive to light. EOM are normal.  Neck: Normal range of motion. No tracheal deviation present. No thyromegaly present.  Cardiovascular: Normal rate and regular rhythm. Exam reveals no friction rub.  No murmur heard. Frequent premature beats  Respiratory: Effort normal and breath sounds normal. She has no wheezes.  GI: Soft. Bowel sounds are normal. She exhibits no distension. There is no abdominal tenderness.  Musculoskeletal:        General: Edema present.  Comments: LUE  Neurological: She is alert and oriented to person, place, and time. No cranial nerve deficit.  Mild left central 7. Pt with reasonable  insight and awareness. RUE and RLE 4/5 prox to distal. LUE  3 to 3+/5 prox to distal.  LLE 3/5. Sensory function 1/2 left side. DTRs's 2+. Mild left inattention  Skin: Skin is warm and dry.  Diffuse ecchymoses LUe/forearm  Psychiatric: She has a normal mood and affect. Her behavior is normal. Thought content normal.    No results found for this or any previous visit (from the past 48 hour(s)). No results found.     Medical Problem List and Plan: 1.  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   -admit to inpatient rehab  -ASA for secondary stroke prophylaxis. Plavix to begin on 06/06/2018 2.  DVT Prophylaxis/Anticoagulation: Pharmaceutical: Lovenox 3. Headaches/Pain Management: tylenol prn.   -tramadol for more severe headache 4. Mood: LCSW to follow for evaluation and support.  5. Neuropsych: This patient is capable of making decisions on her own behalf. 6. Skin/Wound Care: routine pressure relief measures.  7. Fluids/Electrolytes/Nutrition: Monitor I/O. Check lytes in am.          Bary Leriche, PA-C 06/05/2018

## 2018-06-05 NOTE — Consult Note (Signed)
            [facility] Primary Care Navigator  06/05/2018  Mary Mercado 12-Dec-1940 [id number]   Attemptto see patient at the bedside to identify possible discharge needs but [facility] 4W 17[facility] today.  Per MD note, patient was admitted with left upper extremity weakness, numbness of left upper extremity and left lower extremity as well as dizziness. (embolic stroke)  Primary care provider's office is listed as providing transition of care (TOC) follow-up.  Patient has discharge instruction to follow-up primary care provider Sarajane Jews, Ishmael Holter, MD) in 2 weeks following discharge from rehab and follow-up with [facility] in 4 weeks following discharge from rehab.   For additional questions please contact:  Edwena Felty A. Avish Torry, BSN, RN-BC [facility] PRIMARY CARE Navigator Cell: [phone]

## 2018-06-05 NOTE — Progress Notes (Signed)
Patient transferred to inpatient rehab from [place] via wheelchair, accompanied by nurse and patients husband. Patient able to stand and pivot with one assist from wheelchair to bed. Patient alert and oriented x4, oriented to room, bed controls, call light. Initial assessment, head to toe skin assessment completed. Rehab admission notebook given to patient.

## 2018-06-05 NOTE — Progress Notes (Signed)
Inpatient Rehabilitation  Patient information reviewed and entered into eRehab system by Ginger Leeth M. Umaiza Matusik, M.A., CCC/SLP, PPS Coordinator.  Information including medical coding, functional ability and quality indicators will be reviewed and updated through discharge.    Per nursing patient was given "Data Collection Information Summary" for Patients in Inpatient Rehabilitation Facilities with attached "Privacy Act Statement-Health Care Records" upon admission.   

## 2018-06-05 NOTE — PMR Pre-admission (Signed)
PMR Admission Coordinator Pre-Admission Assessment  Patient: Mary Mercado is an 78 y.o., female MRN: [id number] DOB: 23-Feb-1941 Height: 5\' 5"  (165.1 cm) Weight: 64.5 kg              Insurance Information HMO: No    PPO:       PCP:       IPA:       80/20:       OTHER:   PRIMARY:  Medicare A and B      Policy#: [id number]      Subscriber: patient CM Name:        Phone#:       Fax#:   Pre-Cert#:        Employer: Retired Benefits:  Phone #:       Name: Checked in [place] [employer] Date: 04/25/04     Deduct: $1408      Out of Pocket Max: none      Life Max: N/A CIR: 100%      SNF: 100 days Outpatient: 80%     Co-Pay: 20% Home Health: 100%      Co-Pay: none DME: 80%     Co-Pay: 20% Providers: patient's choice  SECONDARY: BCBS supplement      Policy#: [id number]      Subscriber: patient CM Name:        Phone#:       Fax#:   Pre-Cert#:        Employer: Retired Benefits:  Phone #: [phone]     Name:   Eff. 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  Patient lives with spouse independent prior to admission and active. Presented  05/31/2018 with left-sided weakness and decreased sensation as well as dizziness. She did have one episode of emesis. Cranial CT scan negative for acute changes. Patient did receive TPA. CT angiogram of head and neck no large vessel occlusion. Patient noted have elevated  blood pressure placed on Cardene. 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 Small amount of hemorrhage in the right parietal infarction. Hold on anticoagulation due to hemorrhagic conversion. Tolerating a regular diet. Therapy evaluation completed with recommendations of physical medicine rehabilitation consult. TEE negative for source. Given hx palpitations, will do OP 30d monitor following IP rehab to look for source. If negative, place loop in the outpatient setting.   Complete NIHSS TOTAL: 2  Past Medical History  Past Medical History:  Diagnosis Date  . Allergy   . Vertigo, benign positional     Family History  family history includes Colon cancer  in her father; Diabetes in her mother; Heart attack in her mother; Heart attack (age of onset: 42) in her brother.  Prior Rehab/Hospitalizations: No previous rehab.  Has the patient had major surgery during 100 days prior to admission? No. However, patient reports that she did have cataract surgery in 09/19.  Current Medications   Current Facility-Administered Medications:  .  0.9 %  sodium chloride infusion, , Intravenous, Continuous, Lelon Perla, MD, Last Rate: 40 mL/hr at 06/05/18 0435 .  aspirin EC tablet 81 mg, 81 mg, Oral, Daily, Lelon Perla, MD, 81 mg at 06/05/18 1053 .  atorvastatin (LIPITOR) tablet 40 mg, 40 mg, Oral, q1800, Lelon Perla, MD, 40 mg at 06/04/18 1756 .  clopidogrel (PLAVIX) tablet 75 mg, 75 mg, Oral, Daily, Lelon Perla, MD, 75 mg at 06/05/18 1053 .  enoxaparin (LOVENOX) injection 40 mg, 40 mg, Subcutaneous, Q24H, Crenshaw, Denice Bors, MD, 40 mg at 06/04/18 2144 .  ondansetron (ZOFRAN) injection 4 mg, 4 mg, Intravenous, Q6H PRN, Lelon Perla, MD, 4 mg at 05/31/18 2250 .  pantoprazole (PROTONIX) EC tablet 40 mg, 40 mg, Oral, Daily, Lelon Perla, MD, 40 mg at 06/05/18 1053 .  senna-docusate (Senokot-S) tablet 1  tablet, 1 tablet, Oral, QHS PRN, Lelon Perla, MD .  traMADol Veatrice Bourbon) tablet 50 mg, 50 mg, Oral, Q6H PRN, Lelon Perla, MD, 50 mg at 06/02/18 1315  Patients Current Diet:  Diet Order            Diet regular Room service appropriate? Yes; Fluid consistency: Thin  Diet effective now              Precautions / Restrictions Precautions Precautions: Fall Restrictions Weight Bearing Restrictions: No   Has the patient had 2 or more falls or a fall with injury in the past year?No  Prior Activity Level Community (5-7x/wk): Martin Majestic out daily, is retired, was driving.  Home Assistive Devices / Equipment    Prior Device Use: Indicate devices/aids used by the patient prior to current illness, exacerbation or injury? None  Prior Functional Level Prior Function Level of Independence: Independent Comments: active in the church and home  Self Care: Did the patient need help bathing, dressing, using the toilet or eating?  Independent  Indoor Mobility: Did the patient need assistance with walking from room to room (with or without device)? Independent  Stairs: Did the patient need assistance with internal or external stairs (with or without device)? Independent  Functional Cognition: Did the patient need help planning regular tasks such as shopping or remembering to take medications? Independent  Current Functional Level Cognition  Arousal/Alertness: Awake/alert Overall Cognitive Status: Impaired/Different from baseline Orientation Level: Oriented X4 Following Commands: Follows one step commands consistently Safety/Judgement: Decreased awareness of deficits(Proprioceptive deficits in the RUE and RLE) General Comments: pt with impaired sensation on the L LE and UE impairing processing, sequencing, and causing inattention Attention: Focused, Sustained Focused Attention: Appears intact Sustained Attention: Appears intact Memory: Appears intact Awareness: Appears  intact Problem Solving: Appears intact Executive Function: Reasoning, Sequencing Reasoning: Appears intact Sequencing: Appears intact Safety/Judgment: Appears intact    Extremity Assessment (includes Sensation/Coordination)  Upper Extremity Assessment: LUE deficits/detail LUE Deficits / Details: grossly 2/5 throughout shoulder and elbow, decreased digit ROM and weak grip strength; decreased sensation and impaired proprioception; edemous LUE LUE Sensation: decreased light touch, decreased proprioception LUE Coordination: decreased fine motor  Lower Extremity Assessment: Defer to PT evaluation RLE Deficits / Details: lifts against gravity and  can hold against moderate pressure RLE Sensation: WNL LLE Deficits / Details: able to lift against gravity and can hold against minimal pressure LLE Sensation: decreased light touch, decreased proprioception LLE Coordination: decreased fine motor, decreased gross motor    ADLs  Overall ADL's : Needs assistance/impaired Eating/Feeding: Set up, Minimal assistance, Sitting Grooming: Oral care, Brushing hair, Standing, Minimal assistance Upper Body Bathing: Minimal assistance, Sitting, Bed level Upper Body Dressing : Minimal assistance, Sitting, Bed level General ADL Comments: Pt able to transfer to the EOB with supervison to start session, using rail and HOB elevated 30 degrees.  Once sitting had her work on [employer] tasks and functional reaching with the LUE.  Pt exhibits sensory deficits in the LUE as well as decreased FM coordination for removing tops from items or for using bilaterally for folding a bed pad.  Educated pt and family on FM coordination tasks for the LUE.      Mobility  Overal bed mobility: Needs Assistance Bed Mobility: Supine to Sit, Sit to Supine Rolling: Supervision Sidelying to sit: Supervision, HOB elevated Supine to sit: Min assist Sit to supine: Min assist Sit to sidelying: Min assist General bed mobility comments: Pt needed  min assist for lifting the LLE into the bed when laying back down.    Transfers  Overall transfer level: Needs assistance Equipment used: 1 person hand held assist Transfers: Sit to/from Stand Sit to Stand: Mod assist General transfer comment: Pt with decreased proprioception in the LLE resulting in knee buckling without awareness when standing at the sink as well as stepping too far with the LLEduring mobility     Ambulation / Gait / Stairs / Wheelchair Mobility  Ambulation/Gait Ambulation/Gait assistance: Mod assist, +2 safety/equipment Gait Distance (Feet): 120 Feet Assistive device: Rolling walker (2 wheeled) Gait Pattern/deviations: Step-through pattern, Ataxic(strong L Lateral lean) General Gait Details: pt with exaggerated stepping with L LE requiring max verbal cues to minimize step length, maintain L foot in neutral instead of internally rotated, pt with supination as well. Pt with onset of fatigue and L LE buckling requiring maxA to prevent fall and maintain upright standing. pt also requiring modA to maintain L UE gripped on walker. pt reports she can not feel with L UE and LE even with PT assisting L LE placement., pt requiring max verbal cues to shift to the R to step with the L, max tactile cues and modA to facilitate weight shifting Gait velocity: slow    Posture / Balance Balance Overall balance assessment: Needs assistance Sitting-balance support: Feet supported, No upper extremity supported Sitting balance-Leahy Scale: Good Postural control: Left lateral lean Standing balance support: During functional activity Standing balance-Leahy Scale: Poor Standing balance comment: Mod assist needed for dynamic standing balance without assistive device.     Special needs/care consideration BiPAP/CPAP No CPM No Continuous Drip IV No Dialysis No    Life Vest No Oxygen No Special Bed No Trach Size No Wound Vac (area) No    Skin No                        Bowel mgmt: Last BM  06/03/18 Bladder mgmt: Using [facility] with assistance Diabetic mgmt No    Previous Home Environment Living Arrangements: Spouse/significant other  Lives With: Spouse Available Help at Discharge: Family, Skilled Nursing Facility Type of Home: House Home Layout: One level Home Access: Level entry Bathroom Shower/Tub: Holiday representative Toilet: Handicapped height Additional Comments: reports fully handicapped bathroom  Discharge Living Setting Plans for Discharge Living Setting: Patient's home, Lives with (comment), Other (Comment) Type of Home at Discharge: Other (Comment)(condominium) Discharge Home Layout: Two level, Able to live on main level with bedroom/bathroom Alternate Level Stairs-Number of Steps: Flight Discharge Home Access: Level entry(Very small 1-2 inch entry.) Discharge Bathroom Shower/Tub: Walk-in shower, Door Discharge Bathroom Toilet: Handicapped height Discharge Bathroom Accessibility: Yes How Accessible: Accessible via wheelchair, Accessible via walker Does the patient have any problems obtaining your medications?: No  Social/Family/Support Systems Patient Roles: Spouse, Parent(Has a husband and 2 children.) Contact Information: Anatalia Kronk - husband Anticipated Caregiver: Husband Anticipated Caregiver's Contact Information: [name] - husband - [phone] Ability/Limitations of Caregiver: Husband is retired and can assist as needed. 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 Dietary Needs: Regular diet, thin liquids Equipment Needs: TBD Pt/Family Agrees to Admission and willing to participate:  Yes Program Orientation Provided & Reviewed with Pt/Caregiver Including Roles  & Responsibilities: Yes  Decrease burden of Care through IP rehab admission: N/A  Possible need for SNF placement upon discharge: Not anticipated  Patient Condition: This patient's medical and functional status has changed since the consult dated: 06/01/18 in which the Rehabilitation Physician determined and documented that the patient's condition is appropriate for intensive rehabilitative care in an inpatient rehabilitation facility. See "History of Present Illness" (above) for medical update. Functional changes are: Currently requiring mod assist +2 to ambulate 120 feet RW. Patient's medical and functional status update has been discussed with the Rehabilitation physician and patient remains appropriate for inpatient rehabilitation. Will admit to inpatient rehab today.  Preadmission Screen Completed By:  Retta Diones, 06/05/2018 12:47 PM ______________________________________________________________________   Discussed status with Dr. Naaman Plummer on 06/05/18 at 1247 and received telephone approval for admission today.  Admission Coordinator:  Retta Diones, time 1247/Date 06/05/18

## 2018-06-05 NOTE — H&P (Signed)
Physical Medicine and Rehabilitation Admission H&P        Chief Complaint  Patient presents with  . Functional deficits due to stroke  . Numbness      HPI: Mary Mercado is a 78 year old female with history of allergies, BPV, palpatations  otherwise in good health; who was admitted on 05/31/18 LUE weakness, numbness LUE/LLE and dizziness. UDS negative.  CT head negative and she receive tPA. CTA head neck showed intracranial atherosclerosis severe right P2 and mild to moderate B-ICA stenosis and no large vessel occlusion. MRI brian done revealing acute right frontal parietal lobe infarct with small amount of hemorrhage in right parietal infarct and moderate small vessel disease. BLE dopplers were negative for DVT. TEE done revealing EF 60-65% with severely dilated right and left atrium, moderate TV prolapse, plaque in descending aorta, sclerotic AV and no LAA thrombus.   Dizziness and nausea has resolved but patient continues to be limited by left sensory deficits with decreased in coordination and mild weakness. Therapy ongoing and CIR recommended due to functional decline. Neurology recommends 30 day event monitor due to history of palpitations---if negative will need loop recorder to rule out A fib.  Dr. 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       Review of Systems  Constitutional: Negative for chills and fever.  HENT: Negative for hearing loss and tinnitus.   Eyes: Negative for blurred vision and double vision.  Respiratory: Negative for cough and hemoptysis.   Cardiovascular: Negative for chest pain and palpitations.  Gastrointestinal: Negative for constipation, heartburn and nausea.  Genitourinary: Negative for dysuria and urgency.  Musculoskeletal: Negative for back pain, myalgias and neck pain.  Skin: Negative for rash.  Neurological: Positive for dizziness, focal weakness and headaches.            Past Medical History:  Diagnosis Date  . Allergy    . Vertigo, benign positional             Past Surgical History:  Procedure Laterality Date  . Hemilaminectomy and microdiskectomy at L4-5 on the left.   12/10/2003           Family History  Problem Relation Age of Onset  . Colon cancer Father    . Diabetes Mother    . Heart attack Mother          52  . Heart attack Brother 75      Social History:  Married. Retired Publishing copy. Independent without AD--loves sports, sings in 2 choirs and volunteers at church.  She reports that she has never smoked. She has never used smokeless tobacco. She reports that she does not drink alcohol or use drugs.          Allergies  Allergen Reactions  . Acetaminophen Rash      rash            Medications Prior to Admission  Medication Sig Dispense Refill  . aspirin 81 MG tablet Take 81 mg by mouth daily.          Drug Regimen Review  Drug regimen was reviewed and remains appropriate with no significant issues identified   Home: Home Living Family/patient expects to be discharged to:: Private residence Living Arrangements: Spouse/significant other Available Help at Discharge: Family, [place] Type of Home: House Home Access: Level entry Home Layout: One level Bathroom Shower/Tub: Gaffer  Bathroom Toilet: Handicapped height Additional Comments: reports fully handicapped bathroom  Lives With: Spouse   Functional History: Prior Function Level of Independence: Independent Comments: active in the church and home   Functional Status:  Mobility: Bed Mobility Overal bed mobility: Needs Assistance Bed Mobility: Supine to Sit, Sit to Supine Rolling: Supervision Sidelying to sit: Supervision, HOB elevated Supine to sit: Min assist Sit to supine: Min assist Sit to sidelying: Min assist General bed mobility comments: Pt needed min assist for lifting the LLE into the bed when laying back  down. Transfers Overall transfer level: Needs assistance Equipment used: 1 person hand held assist Transfers: Sit to/from Stand Sit to Stand: Mod assist General transfer comment: Pt with decreased proprioception in the LLE resulting in knee buckling without awareness when standing at the sink as well as stepping too far with the [place] mobility  Ambulation/Gait Ambulation/Gait assistance: Mod assist, +2 safety/equipment Gait Distance (Feet): 120 Feet Assistive device: Rolling walker (2 wheeled) Gait Pattern/deviations: Step-through pattern, Ataxic(strong L Lateral lean) General Gait Details: pt with exaggerated stepping with L LE requiring max verbal cues to minimize step length, maintain L foot in neutral instead of internally rotated, pt with supination as well. Pt with onset of fatigue and L LE buckling requiring maxA to prevent fall and maintain upright standing. pt also requiring modA to maintain L UE gripped on walker. pt reports she can not feel with L UE and LE even with PT assisting L LE placement., pt requiring max verbal cues to shift to the R to step with the L, max tactile cues and modA to facilitate weight shifting Gait velocity: slow   ADL: ADL Overall ADL's : Needs assistance/impaired Eating/Feeding: Set up, Minimal assistance, Sitting Grooming: Oral care, Brushing hair, Standing, Minimal assistance Upper Body Bathing: Minimal assistance, Sitting, Bed level Upper Body Dressing : Minimal assistance, Sitting, Bed level General ADL Comments: Pt able to transfer to the EOB with supervison to start session, using rail and HOB elevated 30 degrees.  Once sitting had her work on [employer] tasks and functional reaching with the LUE.  Pt exhibits sensory deficits in the LUE as well as decreased FM coordination for removing tops from items or for using bilaterally for folding a bed pad.  Educated pt and family on FM coordination tasks for the LUE.     Cognition: Cognition Overall Cognitive  Status: Impaired/Different from baseline Arousal/Alertness: Awake/alert Orientation Level: Oriented X4 Attention: Focused, Sustained Focused Attention: Appears intact Sustained Attention: Appears intact Memory: Appears intact Awareness: Appears intact Problem Solving: Appears intact Executive Function: Reasoning, Sequencing Reasoning: Appears intact Sequencing: Appears intact Safety/Judgment: Appears intact Cognition Arousal/Alertness: Awake/alert Behavior During Therapy: WFL for tasks assessed/performed Overall Cognitive Status: Impaired/Different from baseline Area of Impairment: Safety/judgement, Awareness Following Commands: Follows one step commands consistently Safety/Judgement: Decreased awareness of deficits(Proprioceptive deficits in the RUE and RLE) Awareness: Intellectual Problem Solving: Requires verbal cues General Comments: pt with impaired sensation on the L LE and UE impairing processing, sequencing, and causing inattention     Blood pressure 122/62, pulse 60, temperature 97.7 F (36.5 C), temperature source Oral, resp. rate (!) 23, height 5\' 5"  (1.651 m), weight 64.5 kg, SpO2 98 %. Physical Exam  Nursing note and vitals reviewed. Constitutional: She is oriented to person, place, and time. She appears well-developed and well-nourished.  HENT:  Head: Normocephalic and atraumatic.  Eyes: Pupils are equal, round, and reactive to light. EOM are normal.  Neck: Normal range of motion. No tracheal deviation present. No  thyromegaly present.  Cardiovascular: Normal rate and regular rhythm. Exam reveals no friction rub.  No murmur heard. Frequent premature beats  Respiratory: Effort normal and breath sounds normal. She has no wheezes.  GI: Soft. Bowel sounds are normal. She exhibits no distension. There is no abdominal tenderness.  Musculoskeletal:        General: Edema present.     Comments: LUE  Neurological: She is alert and oriented to person, place, and time. No  cranial nerve deficit.  Mild left central 7. Pt with reasonable insight and awareness. RUE and RLE 4/5 prox to distal. LUE  3 to 3+/5 prox to distal.  LLE 3/5. Sensory function 1/2 left side. DTRs's 2+. Mild left inattention  Skin: Skin is warm and dry.  Diffuse ecchymoses LUe/forearm  Psychiatric: She has a normal mood and affect. Her behavior is normal. Thought content normal.      Lab Results Last 48 Hours  No results found for this or any previous visit (from the past 48 hour(s)).   Imaging Results (Last 48 hours)  No results found.           Medical Problem List and Plan: 1.  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              -admit to inpatient rehab             -ASA for secondary stroke prophylaxis. Plavix to begin on 06/06/2018 2.  DVT Prophylaxis/Anticoagulation: Pharmaceutical: Lovenox 3. Headaches/Pain Management: tylenol prn.              -tramadol for more severe headache 4. Mood: LCSW to follow for evaluation and support.  5. Neuropsych: This patient is capable of making decisions on her own behalf. 6. Skin/Wound Care: routine pressure relief measures.  7. Fluids/Electrolytes/Nutrition: Monitor I/O. Check lytes in am.  8. HTN: avoid over treatment, but may need to consider medication if BP remains consistently elevated. Monitor for now     Post Admission Physician Evaluation: 1. Functional deficits secondary  to right frontal-parietal infarct. 2. Patient is admitted to receive collaborative, interdisciplinary care between the physiatrist, rehab nursing staff, and therapy team. 3. Patient's level of medical complexity and substantial therapy needs in context of that medical necessity cannot be provided at a lesser intensity of care such as a SNF. 4. Patient has experienced substantial functional loss from his/her baseline which was documented above under the "Functional History" and "Functional Status" headings.  Judging by  the patient's diagnosis, physical exam, and functional history, the patient has potential for functional progress which will result in measurable gains while on inpatient rehab.  These gains will be of substantial and practical use upon discharge  in facilitating mobility and self-care at the household level. 5. Physiatrist will provide 24 hour management of medical needs as well as oversight of the therapy plan/treatment and provide guidance as appropriate regarding the interaction of the two. 6. The Preadmission Screening has been reviewed and patient status is unchanged unless otherwise stated above. 7. 24 hour rehab nursing will assist with bladder management, bowel management, safety, skin/wound care, disease management, medication administration, pain management and patient education  and help integrate therapy concepts, techniques,education, etc. 8. PT will assess and treat for/with: Lower extremity strength, range of motion, stamina, balance, functional mobility, safety, adaptive techniques and equipment, NMR, visual-spatial awareness.   Goals are: supervision. 9. OT will assess and treat for/with: ADL's, functional mobility, safety, upper extremity strength, adaptive techniques and  equipment, NMR, visual-spatial awareness, family ed.   Goals are: supervision. Therapy may proceed with showering this patient. 10. SLP will assess and treat for/with: n/a.  Goals are: n/a. 11. Case Management and Social Worker will assess and treat for psychological issues and discharge planning. 12. Team conference will be held weekly to assess progress toward goals and to determine barriers to discharge. 13. Patient will receive at least 3 hours of therapy per day at least 5 days per week. 14. ELOS: 11-18 days       15. Prognosis:  excellent   I have personally performed a face to face diagnostic evaluation of this patient and formulated the key components of the plan.  Additionally, I have personally reviewed  laboratory data, imaging studies, as well as relevant notes and concur with the physician assistant's documentation above.  Meredith Staggers, MD, Mell[facility] Drown         Bary Leriche, PA-C 06/05/2018

## 2018-06-05 NOTE — Progress Notes (Signed)
    Transesophageal Echocardiogram Note  Mary Mercado [id number] 1940-11-04  Procedure: Transesophageal Echocardiogram Indications: CVA  Procedure Details Consent: Obtained Time Out: Verified patient identification, verified procedure, site/side was marked, verified correct patient position, special equipment/implants available, Radiology Safety Procedures followed,  medications/allergies/relevent history reviewed, required imaging and test results available.  Performed  Medications:  During this procedure the patient is administered a total of Versed 4 mg and Fentanyl 50 mcg  to achieve and maintain moderate conscious sedation.  The patient's heart rate, blood pressure, and oxygen saturation are monitored continuously during the procedure. The period of conscious sedation is 30 minutes, of which I was present face-to-face 100% of this time.  Normal LV function; severe biatrial enlargement; mild AI; mild MR; moderate to severe TR; no LAA thrombus; negative saline microcavitation study.   Complications: No apparent complications Patient did tolerate procedure well.  Kirk Ruths, MD

## 2018-06-05 NOTE — Interval H&P Note (Signed)
History and Physical Interval Note:  06/05/2018 8:20 AM  Pamela Intrieri  has presented today for surgery, with the diagnosis of STROKE  The various methods of treatment have been discussed with the patient and family. After consideration of risks, benefits and other options for treatment, the patient has consented to  Procedure(s) with comments: TRANSESOPHAGEAL ECHOCARDIOGRAM (TEE) (N/A) - loop as a surgical intervention .  The patient's history has been reviewed, patient examined, no change in status, stable for surgery.  I have reviewed the patient's chart and labs.  Questions were answered to the patient's satisfaction.     Kirk Ruths

## 2018-06-05 NOTE — Discharge Summary (Addendum)
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 Allergy   . Vertigo, benign positional    Past Surgical History:  Procedure Laterality Date  . Hemilaminectomy and microdiskectomy at L4-5 on the left.  12/10/2003    Medications to be continued on Rehab Allergies as of 06/05/2018      Reactions   Acetaminophen Rash   rash      Medication List    TAKE these medications   aspirin 81 MG tablet Take 81 mg by mouth daily.   atorvastatin 40 MG tablet Commonly known as:  LIPITOR Take 1 tablet (40 mg total) by mouth daily at 6 PM.   clopidogrel 75 MG tablet Commonly known as:  PLAVIX Take 1 tablet (75 mg total) by mouth daily. Start taking on:  June 06, 2018   enoxaparin 40 MG/0.4ML injection Commonly known as:  LOVENOX Inject 0.4 mLs (40 mg total) into the skin daily.   traMADol 50 MG tablet Commonly known as:  ULTRAM Take 1 tablet (50 mg total) by mouth every 6 (six) hours as needed (Headache).       LABORATORY STUDIES CBC    Component Value Date/Time   WBC 6.1 05/31/2018 1205   RBC 4.98 05/31/2018 1205   HGB 14.5 05/31/2018 1205   HCT 45.1 05/31/2018 1205   PLT 236 05/31/2018 1205   MCV 90.6 05/31/2018 1205   MCH 29.1 05/31/2018 1205   MCHC 32.2 05/31/2018 1205   RDW 12.9 05/31/2018 1205   LYMPHSABS 1.3 05/31/2018 1205   MONOABS 0.6 05/31/2018 1205   EOSABS 0.1  05/31/2018 1205   BASOSABS 0.0 05/31/2018 1205   CMP    Component Value Date/Time   NA 139 05/31/2018 1205   K 4.6 05/31/2018 1205   CL 99 05/31/2018 1205   CO2 26 05/31/2018 1205   GLUCOSE 123 (H) 05/31/2018 1205   BUN 13 05/31/2018 1205   CREATININE 0.74 05/31/2018 1205   CALCIUM 9.5 05/31/2018 1205   PROT 7.1 05/31/2018 1205   ALBUMIN 4.1 05/31/2018 1205   AST 21 05/31/2018 1205   ALT 15 05/31/2018 1205   ALKPHOS 59 05/31/2018 1205   BILITOT 0.8 05/31/2018 1205   GFRNONAA >60 05/31/2018 1205   GFRAA >60 05/31/2018 1205   COAGS Lab Results  Component Value Date   INR 0.95 05/31/2018   Lipid Panel    Component Value Date/Time   CHOL 243 (H) 06/01/2018 0548   TRIG 80 06/01/2018 0548   HDL 110 06/01/2018 0548   CHOLHDL 2.2 06/01/2018 0548   VLDL 16 06/01/2018 0548   LDLCALC 117 (H) 06/01/2018 0548   HgbA1C  Lab Results  Component Value Date   HGBA1C 5.9 (H) 06/01/2018   Urinalysis    Component Value Date/Time   COLORURINE YELLOW 05/31/2018 1445   APPEARANCEUR CLEAR 05/31/2018 1445   LABSPEC 1.025 05/31/2018 1445   PHURINE 7.0 05/31/2018 1445  GLUCOSEU NEGATIVE 05/31/2018 1445   HGBUR SMALL (A) 05/31/2018 1445   BILIRUBINUR NEGATIVE 05/31/2018 1445   KETONESUR 5 (A) 05/31/2018 1445   PROTEINUR NEGATIVE 05/31/2018 1445   UROBILINOGEN 0.2 02/01/2011 1044   NITRITE NEGATIVE 05/31/2018 1445   LEUKOCYTESUR LARGE (A) 05/31/2018 1445   Urine Drug Screen     Component Value Date/Time   LABOPIA NONE DETECTED 05/31/2018 1445   COCAINSCRNUR NONE DETECTED 05/31/2018 1445   LABBENZ NONE DETECTED 05/31/2018 1445   AMPHETMU NONE DETECTED 05/31/2018 1445   THCU NONE DETECTED 05/31/2018 1445   LABBARB NONE DETECTED 05/31/2018 1445    Alcohol Level    Component Value Date/Time   ETH <10 05/31/2018 1205     SIGNIFICANT DIAGNOSTIC STUDIES CT Head Code Stroke 05/31/2018 1. No acute finding by CT. 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 2.  ASPECTS is 10.  CTA head and neck 1. No large vessel occlusion. 2. 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 3. Severe left vertebral artery origin stenosis. 4. Widely patent cervical carotid arteries. 5. Aortic Atherosclerosis (ICD10-I70.0).  Ct Head Wo Contrast  05/31/2018 No acute intracranial abnormality identified. Stable non contrast CT appearance of the brain from 1220 hours today.   Mr Brain Wo Contrast 06/01/2018 Acute infarct right frontal parietal lobe. Small amount of hemorrhage in the right parietal infarct. Moderate chronic microvascular ischemic changes in the white matter and pons.   Transthoracic Echocardiogram  06/02/18 1. The left ventricle has normal systolic function of 38-25%. The cavity size was normal. There is no increased left ventricular wall thickness. Echo evidence of pseudonormalization in diastolic relaxation and indeterminate left ventricular filling pressure. 2. Grade 2 diastolic dysfunction. 3. The right ventricle has normal systolic function. The cavity was normal. There is no increase in right ventricular wall thickness. 4. Left atrial size was severely dilated. 5. Right atrial size was severely dilated. 6. The mitral valve is normal in structure. 7. The tricuspid valve is normal in structure. Tricuspid valve regurgitation is moderate. 8. The aortic valve is normal in structure. There is moderate thickening and moderate calcification of the aortic valve. 9. The pulmonic valve was normal in structure.  TEE Normal LV function;severe biatrial enlargement; mild AI; mild MR; moderate to severe TR;no LAA thrombus; negative saline microcavitation study.     HISTORY OF PRESENT ILLNESS Mary Mercado is a 78 y.o. female with history of vertigo. Today 05/31/2018 patient came to her husband at approximately 1015 and noted that her left arm felt weak and she had decreased sensation in her  left arm and left leg (LKW).  He told her to sit on the bed as she stated she was feeling dizzy.  She did have one episode of emesis.  She was brought to the emergency department where she continued to have decreased sensation in her left arm left leg and left face.  She continued to have some nausea. She was brought to CT.  She was very anxious at that time.  While in CT she did sit up and have some emesis.  CT of head did not show any blood and CTA was obtained.  CTA did not show any large vessel occlusion.  At that time the decision to give TPA was offered to wife and husband.  Wife and husband could not make a quick decision and talked it over for approximately 30-40 minutes. Patient finally decided to receive TPA at that time, while still in the tPA time window.  Patient needed Cleviprex as her blood pressure was elevated.  Patient denies any chest pain, difficulty swallowing, difficulty with vision, difficulty with understanding or expressing herself, urinating or defecating, weakness, stomach pain. Premorbid modified Rankin scale (mRS): 0. NIHSS 4 sensation, neglect, ataxia. She was admitted to the neuro ICU for further evaluation and treatment.    HOSPITAL COURSE Ms. Debbe Crumble is a 78 y.o. female with history of only vertigo, presenting with left sided sensory numbness and mild weakness. Treated with IV tPA. MRI confirmed R frontal parietal embolic infarct. TEE negative for source. Given hx palpitations, will do OP 30d monitor following IP rehab to look for source. If negative, place loop. Treat w/ DAPT x 3 weeks then plavix alone. D/c to IP rehab.  Stroke - right MCA infarcts s/p IV tPA, embolic pattern, source unclear  Resultant distal>prox weakness in left hand  CT head neg  MRI head - right frontal parietal MCA region  CTA H&N - intracranial athero, severe Right P2 and mild bilat tICAs, severe left vert stenosis  2D Echo  - EF 60 - 65%. No cardiac source of emboli identified.    LE venous Doppler no DVT  TEE neg  Plan OP 30d monitor after CIR to look for AF as source of stroke  LDL - 117  HgbA1c - 5.9  UDS - neg  81mg  ASA prior to admission, now on ASA 81 mg and plavix 75mg  daily. Continue DAPT for 3 weeks and then PLAVIX alone.  Therapy recommendations:  CIR  Disposition:  CIR  Heart palpitation  Patient stated that she had intermittent heart palpitation  Have not been worked up before  EP recommends OP 30d monitor first. If neg, consider loop recorder to rule out A. fib  Hypertension  Stable  Ok for BP goal normotensive  Hyperlipidemia  Lipid lowering medication PTA:  none  LDL 117, goal < 70  Current lipid lowering medication: Lipitor 40mg   Continue statin at discharge  Other Stroke Risk Factors  Advanced age   DISCHARGE EXAM Blood pressure 134/76, pulse 66, temperature 98 F (36.7 C), temperature source Oral, resp. rate 18, height 5\' 5"  (1.651 m), weight 64.5 kg, SpO2 100 %. General - Well nourished, well developed, in no apparent distress. Cardiovascular - Regular rate and rhythm.  Mental Status -  Level of arousal and orientation to time, place, and person were intact. Language including expression, naming, repetition, comprehension was assessed and found intact. Fund of Knowledge was assessed and was intact.  Cranial Nerves II - XII - II - Visual field intact OU. III, IV, VI - Extraocular movements intact. V - Facial sensation intact bilaterally. VII - Facial movement intact bilaterally. VIII - Hearing & vestibular intact bilaterally. X - Palate elevates symmetrically. XI - Chin turning & shoulder shrug intact bilaterally. XII - Tongue protrusion intact.  Motor Strength - The patient's strength was normal in RUE and RLE, but LUE 4+/5 proximal and 4/5 distal with finger dexterity. LLE 4/5.  Bulk was normal and fasciculations were absent.   Motor Tone - Muscle tone was assessed at the neck and appendages  and was normal.  Sensory - Light touch, temperature/pinprick were assessed and were symmetrical.    Coordination - Decreased FMM L hand, no ataxia or dysmetria out of proportion to weakness.  Tremor was absent  Gait and Station - deferred.  Discharge Diet  Regular thin liquids  DISCHARGE PLAN  Disposition:  Transfer to [facility] for ongoing PT, OT  and ST  aspirin 81 mg daily and clopidogrel 75 mg daily for secondary stroke prevention x 3 weeks then PLAVIX alone  Recommend ongoing risk factor control by Primary Care Physician at time of discharge from inpatient rehabilitation.  Follow-up [name], MD in 2 weeks following discharge from rehab.  Follow-up in [facility] in 4 weeks following discharge from rehab, office to schedule an appointment.   40 minutes were spent preparing discharge.  Burnetta Sabin, MSN, APRN, ANVP-BC, AGPCNP-BC Advanced Practice Stroke Nurse [place] for Schedule & Pager information 06/05/2018 12:12 PM   ATTENDING NOTE: I reviewed above note and agree with the assessment and plan.   No acute event overnight, neuro stable. Had TEE this am which was unremarkable. EP on board recommend 30 day cardiac event monitoring first. She will continue DAPT for 3 weeks and then plavix alone. Continue lipitor. Stroke risk factor modification. Ready for discharge. She will follow up with [facility] stroke clinic in 4 weeks.   Rosalin Hawking, MD PhD Stroke Neurology 06/05/2018 2:17 PM

## 2018-06-06 ENCOUNTER — Encounter (HOSPITAL_COMMUNITY): Payer: Self-pay | Admitting: Cardiology

## 2018-06-06 ENCOUNTER — Inpatient Hospital Stay (HOSPITAL_COMMUNITY): Payer: Medicare Other | Admitting: Occupational Therapy

## 2018-06-06 ENCOUNTER — Inpatient Hospital Stay (HOSPITAL_COMMUNITY): Payer: Medicare Other | Admitting: Physical Therapy

## 2018-06-06 DIAGNOSIS — I69398 Other sequelae of cerebral infarction: Secondary | ICD-10-CM

## 2018-06-06 DIAGNOSIS — R414 Neurologic neglect syndrome: Secondary | ICD-10-CM

## 2018-06-06 DIAGNOSIS — I69354 Hemiplegia and hemiparesis following cerebral infarction affecting left non-dominant side: Principal | ICD-10-CM

## 2018-06-06 DIAGNOSIS — R209 Unspecified disturbances of skin sensation: Secondary | ICD-10-CM

## 2018-06-06 LAB — COMPREHENSIVE METABOLIC PANEL
ALT: 24 U/L (ref 0–44)
AST: 30 U/L (ref 15–41)
Albumin: 3.4 g/dL — ABNORMAL LOW (ref 3.5–5.0)
Alkaline Phosphatase: 54 U/L (ref 38–126)
Anion gap: 6 (ref 5–15)
BUN: 13 mg/dL (ref 8–23)
CO2: 28 mmol/L (ref 22–32)
Calcium: 9 mg/dL (ref 8.9–10.3)
Chloride: 106 mmol/L (ref 98–111)
Creatinine, Ser: 0.67 mg/dL (ref 0.44–1.00)
GFR calc Af Amer: >60 mL/min (ref >60)
GFR calc non Af Amer: >60 mL/min (ref >60)
Glucose, Bld: 104 mg/dL — ABNORMAL HIGH (ref 70–99)
Potassium: 3.9 mmol/L (ref 3.5–5.1)
Sodium: 140 mmol/L (ref 135–145)
Total Bilirubin: 1.2 mg/dL (ref 0.3–1.2)
Total Protein: 6.2 g/dL — ABNORMAL LOW (ref 6.5–8.1)

## 2018-06-06 LAB — CBC WITH DIFFERENTIAL/PLATELET
Abs Immature Granulocytes: 0.02 10*3/uL (ref 0.00–0.07)
Basophils Absolute: 0.1 10*3/uL (ref 0.0–0.1)
Basophils Relative: 1 %
Eosinophils Absolute: 0.2 10*3/uL (ref 0.0–0.5)
Eosinophils Relative: 3 %
HCT: 38.6 % (ref 36.0–46.0)
Hemoglobin: 12.6 g/dL (ref 12.0–15.0)
Immature Granulocytes: 0 %
Lymphocytes Relative: 24 %
Lymphs Abs: 1.7 10*3/uL (ref 0.7–4.0)
MCH: 28.8 pg (ref 26.0–34.0)
MCHC: 32.6 g/dL (ref 30.0–36.0)
MCV: 88.1 fL (ref 80.0–100.0)
Monocytes Absolute: 0.7 10*3/uL (ref 0.1–1.0)
Monocytes Relative: 10 %
Neutro Abs: 4.3 10*3/uL (ref 1.7–7.7)
Neutrophils Relative %: 62 %
Platelets: 224 10*3/uL (ref 150–400)
RBC: 4.38 MIL/uL (ref 3.87–5.11)
RDW: 12.7 % (ref 11.5–15.5)
WBC: 7 10*3/uL (ref 4.0–10.5)
nRBC: 0 % (ref 0.0–0.2)

## 2018-06-06 NOTE — Care Management Note (Signed)
[facility] Individual Statement of Services  Patient Name:  Mary Mercado  Date:  06/06/2018  Welcome to the [facility].  Our goal is to provide you with an individualized program based on your diagnosis and situation, designed to meet your specific needs.  With this comprehensive rehabilitation program, you will be expected to participate in at least 3 hours of rehabilitation therapies Monday-Friday, with modified therapy programming on the weekends.  Your rehabilitation program will include the following services:  Physical Therapy (PT), Occupational Therapy (OT), Speech Therapy (ST), 24 hour per day rehabilitation nursing, Neuropsychology, Case Management (Social Worker), Rehabilitation Medicine, Nutrition Services and Pharmacy Services  Weekly team conferences will be held on Wednesday to discuss your progress.  Your Social Worker will talk with you frequently to get your input and to update you on team discussions.  Team conferences with you and your family in attendance may also be held.  Expected length of stay: 10-14 days Overall anticipated outcome: supervision-independent with device  Depending on your progress and recovery, your program may change. Your Social Worker will coordinate services and will keep you informed of any changes. Your Social Worker's name and contact numbers are listed  below.  The following services may also be recommended but are not provided by the [facility] will be made to provide these services after discharge if needed.  Arrangements include referral to agencies that provide these services.  Your insurance has been verified to be:  [employer] Your primary doctor is:  [name]  Pertinent information will be shared with your doctor and your insurance company.  Social  Worker:  Ovidio Kin, [place] or ([phone]  Information discussed with and copy given to patient by: Elease Hashimoto, 06/06/2018, 8:56 AM

## 2018-06-06 NOTE — Progress Notes (Signed)
[facility] PHYSICAL MEDICINE & REHABILITATION PROGRESS NOTE   Subjective/Complaints:  Slept poorly for no apparent reason per pt.  Feels that Left arm bruising improved  ROS: Denies-pain, breathing problems, bowel problem, bladder issues  Objective:   No results found. Recent Labs    06/06/18 0601  WBC 7.0  HGB 12.6  HCT 38.6  PLT 224   Recent Labs    06/06/18 0601  NA 140  K 3.9  CL 106  CO2 28  GLUCOSE 104*  BUN 13  CREATININE 0.67  CALCIUM 9.0    Intake/Output Summary (Last 24 hours) at 06/06/2018 0834 Last data filed at 06/05/2018 1400 Gross per 24 hour  Intake 240 ml  Output -  Net 240 ml     Physical Exam: Vital Signs Blood pressure 123/81, pulse 71, temperature 98.4 F (36.9 C), temperature source Oral, resp. rate 14, height 5\' 5"  (1.651 m), weight 64.2 kg, SpO2 99 %.  General: No acute distress Mood and affect are appropriate Heart: Regular rate and rhythm no rubs murmurs or extra sounds Lungs: Clear to auscultation, breathing unlabored, no rales or wheezes Abdomen: Positive bowel sounds, soft nontender to palpation, nondistended Extremities: No clubbing, cyanosis, or edema Skin: No evidence of breakdown, no evidence of rash Neurologic: Cranial nerves II through XII intact, motor strength is 5/5 in RIght and 4/5 Left  deltoid, bicep, tricep, grip, hip flexor, knee extensors, ankle dorsiflexor and plantar flexor Sensory examreduced  sensation to light touch and proprioception inLeft  upper and lower extremities Left neglect on confrontation testing Musculoskeletal: Full range of motion in all 4 extremities. No joint swelling    Assessment/Plan: 1. Functional deficits secondary to RIght fronto parietal infarct which require 3+ hours per day of interdisciplinary therapy in a comprehensive inpatient rehab setting.  Physiatrist is providing close team supervision and 24 hour management of active medical problems listed below.  Physiatrist and rehab  team continue to assess barriers to discharge/monitor patient progress toward functional and medical goals  Care Tool:  Bathing              Bathing assist       Upper Body Dressing/Undressing Upper body dressing        Upper body assist      Lower Body Dressing/Undressing Lower body dressing            Lower body assist       Toileting Toileting    Toileting assist Assist for toileting: Moderate Assistance - Patient 50 - 74%     Transfers Chair/bed transfer  Transfers assist           Locomotion Ambulation   Ambulation assist              Walk 10 feet activity   Assist           Walk 50 feet activity   Assist           Walk 150 feet activity   Assist           Walk 10 feet on uneven surface  activity   Assist           Wheelchair     Assist               Wheelchair 50 feet with 2 turns activity    Assist            Wheelchair 150 feet activity     Assist  Medical Problem List and Plan: 1.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  Probable cardioembolic  -CIR PT, OT, SLP evals today -ASA for secondary stroke prophylaxis. Plavix to begin on 06/06/2018 2. DVT Prophylaxis/Anticoagulation: Pharmaceutical:Lovenox, CBC normal 2/12 3.Headaches/Pain Management:tylenol prn. -tramadol for more severe headache 4. Mood:LCSW to follow for evaluation and support. 5. Neuropsych: This patientiscapable of making decisions on herown behalf. 6. Skin/Wound Care:routine pressure relief measures. 7. Fluids/Electrolytes/Nutrition:Monitor I/O. CMET normal except mildly low alb 8. HTN: avoid over treatment, but may need to consider medication if BP remains consistently elevated. Monitor for now Vitals:   06/05/18 1936 06/06/18 0549  BP: 115/68 123/81  Pulse: 72 71  Resp: 15 14  Temp: (!) 97.2 F  (36.2 C) 98.4 F (36.9 C)  SpO2: 98% 99%  controlled 2/12  9. Left arm hematoma after infiltration of IV TPA- no drop in hgb-monitor  LOS: 1 days A FACE TO FACE EVALUATION WAS PERFORMED  Charlett Blake 06/06/2018, 8:34 AM

## 2018-06-06 NOTE — Progress Notes (Signed)
Meredith Staggers, MD  Physician  Physical Medicine and Rehabilitation  Consult Note  Signed  Date of Service:  06/01/2018 2:55 PM       Related encounter: ED to Hosp-Admission (Discharged) from 05/31/2018 in [facility]      Signed      Expand All Collapse All    Show:Clear all [x] Manual[x] Template[] Copied  Added by: [x] Angiulli, Lavon Paganini, PA-C[x] Meredith Staggers, MD  [] Hover for details      Physical Medicine and Rehabilitation Consult Reason for Consult: left side weakness Referring Physician: Dr. Leonie Man   HPI: Mary Mercado is a 78 y.o.right handed female with history of vertigo.  Patient lives with spouse independent prior to admission and active. Presented  05/31/2018 with left-sided weakness and decreased sensation as well as dizziness. She did have one episode of emesis. Cranial CT scan negative for acute changes. Patient did receive TPA. CT angiogram of head and neck no large vessel occlusion. Patient noted have elevated blood pressure placed on Cardene. 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 Small amount of hemorrhage in the right parietal infarction. Hold on anticoagulation due to hemorrhagic conversion. Tolerating a regular diet. Therapy evaluation completed with recommendations of physical medicine rehabilitation consult.   Review of Systems  Constitutional: Negative for fever.  HENT: Negative for hearing loss.   Eyes: Negative for blurred vision and double vision.  Respiratory: Negative for cough and shortness of breath.   Cardiovascular: Negative for chest pain, palpitations and leg swelling.  Gastrointestinal: Positive for nausea and vomiting.  Genitourinary: Negative for dysuria, hematuria and urgency.  Musculoskeletal: Positive for myalgias.  Skin: Negative for rash.  Neurological: Positive for dizziness and focal weakness.  All other systems  reviewed and are negative.      Past Medical History:  Diagnosis Date  . Allergy   . Vertigo, benign positional         Past Surgical History:  Procedure Laterality Date  . Hemilaminectomy and microdiskectomy at L4-5 on the left.  12/10/2003        Family History  Problem Relation Age of Onset  . Colon cancer Father   . Diabetes Mother   . Heart attack Mother        12  . Heart attack Brother 56   Social History:  reports that she has never smoked. She has never used smokeless tobacco. She reports that she does not drink alcohol or use drugs. Allergies:       Allergies  Allergen Reactions  . Acetaminophen Rash    rash         Medications Prior to Admission  Medication Sig Dispense Refill  . aspirin 81 MG tablet Take 81 mg by mouth daily.      Home: Home Living Family/patient expects to be discharged to:: Private residence Living Arrangements: Spouse/significant other Available Help at Discharge: Family, [place] Type of Home: House Home Access: Level entry [place]: One level Bathroom Shower/Tub: Multimedia programmer: Handicapped height Additional Comments: reports fully handicapped bathroom  Lives With: Spouse  Functional History: Prior Function Level of Independence: Independent Comments: active in the church and home Functional Status:  Mobility: Bed Mobility Overal bed mobility: Needs Assistance Bed Mobility: Supine to Sit, Sit to Supine Supine to sit: Min assist Sit to supine: Mod assist, +2 for physical assistance General bed mobility comments: Min A with cueing for sequencing to come to EOB -  poor proprioception of L LE; Mod A +2 to return to supine due to increased complaints of nausea and not feeling well; able to sit EOB x ~6 min with Min/Mod A for balance Transfers Overall transfer level: Needs assistance Equipment used: 2 person hand held assist Transfers: Sit to/from Stand Sit to Stand: Mod  assist, +2 physical assistance General transfer comment: Mod A +2 to rise from seated position with +2 HHA; able to take small shuffle steps for bed position; verbal and tactile cueing for weight shift and LE movement; required L knee blocking to prevent buckling  ADL:  Cognition: Cognition Overall Cognitive Status: Within Functional Limits for tasks assessed Arousal/Alertness: Awake/alert Orientation Level: Oriented X4 Attention: Focused, Sustained Focused Attention: Appears intact Sustained Attention: Appears intact Memory: Appears intact Awareness: Appears intact Problem Solving: Appears intact Executive Function: Reasoning, Sequencing Reasoning: Appears intact Sequencing: Appears intact Safety/Judgment: Appears intact Cognition Arousal/Alertness: Awake/alert, Lethargic Behavior During Therapy: WFL for tasks assessed/performed Overall Cognitive Status: Within Functional Limits for tasks assessed  Blood pressure (!) 138/91, pulse 76, temperature 99.5 F (37.5 C), temperature source Axillary, resp. rate (!) 24, height 5\' 5"  (1.651 m), weight 65.1 kg, SpO2 (!) 82 %. Physical Exam  Vitals reviewed. Constitutional: She is oriented to person, place, and time.  HENT:  Head: Normocephalic.  Eyes: EOM are normal.  Neck: Normal range of motion. Neck supple. No thyromegaly present.  Cardiovascular: Normal rate, regular rhythm and normal heart sounds.  Respiratory: Effort normal and breath sounds normal. No respiratory distress.  GI: Soft. Bowel sounds are normal. She exhibits no distension.  Neurological: She is alert and oriented to person, place, and time.  Follows commands    LabResultsLast24Hours  Results for orders placed or performed during the hospital encounter of 05/31/18 (from the past 24 hour(s))  Hemoglobin A1c     Status: Abnormal   Collection Time: 06/01/18  5:48 AM  Result Value Ref Range   Hgb A1c MFr Bld 5.9 (H) 4.8 - 5.6 %   Mean Plasma Glucose  122.63 mg/dL  Lipid panel     Status: Abnormal   Collection Time: 06/01/18  5:48 AM  Result Value Ref Range   Cholesterol 243 (H) 0 - 200 mg/dL   Triglycerides 80 <150 mg/dL   HDL 110 >40 mg/dL   Total CHOL/HDL Ratio 2.2 RATIO   VLDL 16 0 - 40 mg/dL   LDL Cholesterol 117 (H) 0 - 99 mg/dL      ImagingResults(Last48hours)  Ct Angio Head W Or Wo Contrast  Result Date: 05/31/2018 CLINICAL DATA:  Left hand and leg numbness. EXAM: CT ANGIOGRAPHY HEAD AND NECK TECHNIQUE: Multidetector CT imaging of the head and neck was performed using the standard protocol during bolus administration of intravenous contrast. Multiplanar CT image reconstructions and MIPs were obtained to evaluate the vascular anatomy. Carotid stenosis measurements (when applicable) are obtained utilizing NASCET criteria, using the distal internal carotid diameter as the denominator. CONTRAST:  38mL ISOVUE-370 IOPAMIDOL (ISOVUE-370) INJECTION 76% COMPARISON:  None. FINDINGS: CTA NECK FINDINGS Aortic arch: Standard 3 vessel aortic arch with moderate atherosclerotic plaque. No arch vessel origin stenosis. Right carotid system: Patent with predominantly soft plaque in the carotid bulb. No significant stenosis or dissection. Left carotid system: Patent with mild plaque at the carotid bifurcation. No significant stenosis or dissection. Vertebral arteries: Patent with the right vertebral artery being moderately dominant. Severe left vertebral artery origin stenosis. Skeleton: Reversal of the cervical lordosis with disc degeneration most severe at C5-6. Other neck:  Asymmetric fatty replacement of the right submandibular gland. Small thyroid nodules. Upper chest: Clear lung apices. Review of the MIP images confirms the above findings CTA HEAD FINDINGS Anterior circulation: The internal carotid arteries are patent from skull base to carotid termini with calcific atherosclerosis resulting in mild-to-moderate proximal supraclinoid stenosis  bilaterally. ACAs and MCAs are patent without evidence of proximal branch occlusion or significant stenosis. No aneurysm is identified. Posterior circulation: The intracranial vertebral arteries are patent to the basilar with atherosclerotic irregularity bilaterally and multiple mild left V4 stenoses. Patent PICAs, AICAs, and SCAs are seen bilaterally. There may be a severe left AICA origin stenosis. The basilar artery is patent with mild narrowing distally. A moderately large left posterior communicating artery is present. The left P1 segment is mildly hypoplastic. There is a proximal right P2 stenosis. No aneurysm is identified. Venous sinuses: As permitted by contrast timing, patent. Anatomic variants: None. Review of the MIP images confirms the above findings IMPRESSION: 1. No large vessel occlusion. 2. 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 3. Severe left vertebral artery origin stenosis. 4. Widely patent cervical carotid arteries. 5.  Aortic Atherosclerosis (ICD10-I70.0). These results were called by telephone at the time of interpretation on 05/31/2018 at 12:45 pm to [facility] , who verbally acknowledged these results. Electronically Signed   By: Logan Bores M.D.   On: 05/31/2018 13:01   Ct Angio Neck W Or Wo Contrast  Result Date: 05/31/2018 CLINICAL DATA:  Left hand and leg numbness. EXAM: CT ANGIOGRAPHY HEAD AND NECK TECHNIQUE: Multidetector CT imaging of the head and neck was performed using the standard protocol during bolus administration of intravenous contrast. Multiplanar CT image reconstructions and MIPs were obtained to evaluate the vascular anatomy. Carotid stenosis measurements (when applicable) are obtained utilizing NASCET criteria, using the distal internal carotid diameter as the denominator. CONTRAST:  63mL ISOVUE-370 IOPAMIDOL (ISOVUE-370) INJECTION 76% COMPARISON:  None. FINDINGS: CTA NECK FINDINGS Aortic arch: Standard 3  vessel aortic arch with moderate atherosclerotic plaque. No arch vessel origin stenosis. Right carotid system: Patent with predominantly soft plaque in the carotid bulb. No significant stenosis or dissection. Left carotid system: Patent with mild plaque at the carotid bifurcation. No significant stenosis or dissection. Vertebral arteries: Patent with the right vertebral artery being moderately dominant. Severe left vertebral artery origin stenosis. Skeleton: Reversal of the cervical lordosis with disc degeneration most severe at C5-6. Other neck: Asymmetric fatty replacement of the right submandibular gland. Small thyroid nodules. Upper chest: Clear lung apices. Review of the MIP images confirms the above findings CTA HEAD FINDINGS Anterior circulation: The internal carotid arteries are patent from skull base to carotid termini with calcific atherosclerosis resulting in mild-to-moderate proximal supraclinoid stenosis bilaterally. ACAs and MCAs are patent without evidence of proximal branch occlusion or significant stenosis. No aneurysm is identified. Posterior circulation: The intracranial vertebral arteries are patent to the basilar with atherosclerotic irregularity bilaterally and multiple mild left V4 stenoses. Patent PICAs, AICAs, and SCAs are seen bilaterally. There may be a severe left AICA origin stenosis. The basilar artery is patent with mild narrowing distally. A moderately large left posterior communicating artery is present. The left P1 segment is mildly hypoplastic. There is a proximal right P2 stenosis. No aneurysm is identified. Venous sinuses: As permitted by contrast timing, patent. Anatomic variants: None. Review of the MIP images confirms the above findings IMPRESSION: 1. No large vessel occlusion. 2. 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 3.  Severe left vertebral artery origin stenosis. 4. Widely patent cervical carotid arteries. 5.   Aortic Atherosclerosis (ICD10-I70.0). These results were called by telephone at the time of interpretation on 05/31/2018 at 12:45 pm to [facility] , who verbally acknowledged these results. Electronically Signed   By: Logan Bores M.D.   On: 05/31/2018 13:01   Mr Brain Wo Contrast  Result Date: 06/01/2018 CLINICAL DATA:  Stroke follow-up. Left-sided neglect. TPA administered yesterday. EXAM: MRI HEAD WITHOUT CONTRAST TECHNIQUE: Multiplanar, multiecho pulse sequences of the brain and surrounding structures were obtained without intravenous contrast. COMPARISON:  CT head 05/31/2018 FINDINGS: Brain: Acute infarct in the right parietal lobe. Small satellite areas of acute infarct in the right frontal and parietal lobe. Small area of hemorrhage in the right parietal infarct. Mild atrophy. Moderate chronic microvascular ischemic changes in the white matter and pons. Chronic microhemorrhage right cerebellum. Negative for mass lesion. Ventricle size normal. Vascular: Normal arterial flow voids Skull and upper cervical spine: Negative Sinuses/Orbits: Paranasal sinuses clear. Cataract surgery on the left. Other: None IMPRESSION: Acute infarct right frontal parietal lobe. Small amount of hemorrhage in the right parietal infarct. Moderate chronic microvascular ischemic changes in the white matter and pons. These results will be called to the ordering clinician or representative by the Radiologist Assistant, and communication documented in the PACS or zVision Dashboard. Electronically Signed   By: Franchot Gallo M.D.   On: 06/01/2018 13:08   Ct Head Code Stroke Wo Contrast  Result Date: 05/31/2018 CLINICAL DATA:  Code stroke. Focal neurological deficit. Clinical finding not specified. EXAM: CT HEAD WITHOUT CONTRAST TECHNIQUE: Contiguous axial images were obtained from the base of the skull through the vertex without intravenous contrast. COMPARISON:  None. FINDINGS: Brain: There are chronic small-vessel ischemic changes  affecting the pons and the cerebral hemispheric white matter. There is no CT evidence of acute infarction, mass lesion, hemorrhage, hydrocephalus or extra-axial collection. Vascular: There is atherosclerotic calcification of the major vessels at the base of the brain. Skull: Negative Sinuses/Orbits: Clear/normal Other: None ASPECTS ([place] Stroke Program Early CT Score) - Ganglionic level infarction (caudate, lentiform nuclei, internal capsule, insula, M1-M3 cortex): 7 - Supraganglionic infarction (M4-M6 cortex): 3 Total score (0-10 with 10 being normal): 10 IMPRESSION: 1. No acute finding by CT. 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 2. ASPECTS is 10. * These results were communicated to Dr. Cheral Marker at 12:28 pmon 2/6/2020by text page via the [facility] messaging system. Electronically Signed   By: Nelson Chimes M.D.   On: 05/31/2018 12:29   Ct Head Wo Contrast (charm Study)  Result Date: 05/31/2018 CLINICAL DATA:  78 year old female code stroke presentation with abnormal speech. Status post IV tPA at 1306 hours today. EXAM: CT HEAD WITHOUT CONTRAST TECHNIQUE: Contiguous axial images were obtained from the base of the skull through the vertex without intravenous contrast. COMPARISON:  CT head, CTA head and neck earlier today. FINDINGS: Brain: Stable gray-white matter differentiation throughout the brain. Patchy bilateral cerebral white matter hypodensity appears stable. No cortically based acute infarct identified. No acute intracranial hemorrhage identified. No midline shift, mass effect, or evidence of intracranial mass lesion. No ventriculomegaly. Vascular: Extensive Calcified atherosclerosis at the skull base. Skull: No acute osseous abnormality identified. Sinuses/Orbits: Visualized paranasal sinuses and mastoids are stable and well pneumatized. Other: Stable orbit and scalp soft tissues. IMPRESSION: No acute intracranial abnormality identified. Stable non contrast CT appearance of the  brain from 1220 hours today. Electronically Signed   By: Herminio Heads.D.  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 Does the need for close, 24 hr/day medical supervision in concert with the patient's rehab needs make it unreasonable for this patient to be served in a less intensive setting? Yes 2. 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 Due to bladder management, bowel management, safety, skin/wound care, disease management, medication administration, pain management and patient education, does the patient require 24 hr/day rehab nursing? Yes 4. Does the patient require coordinated care of a physician, rehab nurse, PT (1-2 hrs/day, 5 days/week) and OT (-2 hrs/day, 5 days/week) to address physical and functional deficits in the context of the above medical diagnosis(es)? Yes Addressing deficits in the following areas: balance, endurance, locomotion, strength, transferring, bowel/bladder control, bathing, dressing, feeding, grooming, toileting and psychosocial support 5. Can the patient actively participate in an intensive therapy program of at least 3 hrs of therapy per day at least 5 days per week? Yes 6. The potential for patient to make measurable gains while on inpatient rehab is good 7. Anticipated functional outcomes upon discharge from inpatient rehab are supervision  with PT, supervision with OT, n/a with SLP. 8. Estimated rehab length of stay to reach the above functional goals is: 11-18 days 9. Anticipated D/C setting: Home 10. Anticipated post D/C treatments: HH therapy and Outpatient therapy 11. Overall Rehab/Functional Prognosis: excellent  RECOMMENDATIONS: This patient's condition is appropriate for continued rehabilitative care in the following setting: CIR Patient has agreed to participate in recommended  program. Yes Note that insurance prior authorization may be required for reimbursement for recommended care.  Comment: Rehab Admissions Coordinator to follow up.  Thanks,  Meredith Staggers, MD, Mellody Drown  I have personally performed a face to face diagnostic evaluation of this patient. Additionally, I have reviewed and concur with the physician assistant's documentation above.    Lavon Paganini Angiulli, PA-C 06/01/2018        Revision History                        Routing History

## 2018-06-06 NOTE — Progress Notes (Signed)
Social Work  Social Work Assessment and Plan  Patient Details  Name: Mary Mercado MRN: [id number] Date of Birth: 08-30-1940  Today's Date: 06/06/2018  Problem List:  Patient Active Problem List   Diagnosis Date Noted  . Palpitations 06/05/2018  . Essential hypertension 06/05/2018  . Hyperlipidemia LDL goal <70 06/05/2018  . 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 SVT (supraventricular tachycardia) ([place]) 05/28/2015  . PVC's (premature ventricular contractions) 05/28/2015  . BENIGN POSITIONAL VERTIGO 06/16/2009  . ALLERGIC RHINITIS 06/16/2009   Past Medical History:  Past Medical History:  Diagnosis Date  . Allergy   . Vertigo, benign positional    Past Surgical History:  Past Surgical History:  Procedure Laterality Date  . Hemilaminectomy and microdiskectomy at L4-5 on the left.  12/10/2003   Social History:  reports that she has never smoked. She has never used smokeless tobacco. She reports that she does not drink alcohol or use drugs.  Family / Support Systems Marital Status: Married Patient Roles: Spouse, Parent, Volunteer Spouse/Significant Other: [name] [phone] Children: Two children who are involved Other Supports: Church members and friends Anticipated Caregiver: Husband Ability/Limitations of Caregiver: Husband is a retired Company secretary and in good Pension scheme manager Availability: [place]: Close knit with her family and two children, she is very acitve in their church and sings in two choirs. They have good supports via church members and friends. Both feel they have great social supports.  Social History Preferred language: English Religion:  Cultural Background: No issues Education: Dentist Read: Yes Write: Yes Employment Status: Retired Public relations account executive Issues: No issues Guardian/Conservator: None-according to MD pt  is capable of making her own decisions while here. Husband plans to be here daily and participate   Abuse/Neglect Abuse/Neglect Assessment Can Be Completed: Yes Physical Abuse: Denies Verbal Abuse: Denies Sexual Abuse: Denies Exploitation of patient/patient's resources: Denies Self-Neglect: Denies  Emotional Status Pt's affect, behavior and adjustment status: Pt is motivated to recover from this stroke, she never thought she would have one. She states: " I thought I was too young."  She laughs when she says this. She wants to get back to her church activites and being able to be independent.  Her husband is very supportive. Recent Psychosocial Issues: Health issues managed by PCP Psychiatric History: No history deferred depression screen due to adjusting to the new unit and beginning her first day. Do feel she may benefit from seeing neuro-psych while here, due to was tearful when discussing stroke. She has a very strong faith and will rely upon this also. Substance Abuse History: No issues  Patient / Family Perceptions, Expectations & Goals Pt/Family understanding of illness & functional limitations: Pt and husband talk with the MD daily and feel they have a good understanding of her stroke and deficits and the treatment plan going forward. She is willing to work hard to recover from this. Husband plans to be here and provide support and see her progress. Premorbid pt/family roles/activities: Wife, Mom, grandmother, retiree, church member, Psychologist, occupational, Social research officer, government Anticipated changes in roles/activities/participation: resume Pt/family expectations/goals: Pt states: " I want to be able to take care of myself again and not need help."  Husband states: " I am hopeful she will be walking and progressing I will help her in anyway I can, but know she wants to do for herself."  [employer]: None Premorbid Home Care/DME Agencies: None  Transportation available at discharge: Husband,  pt was driving prior to admission Resource referrals recommended: Support group (specify), Neuropsychology  Discharge Planning Living Arrangements: Spouse/significant other Support Systems: Spouse/significant other, Children, Other relatives, Friends/neighbors, Church/faith community Type of Residence: Private residence Insurance Resources: [employer], Multimedia programmer (specify)(BCBS) Financial Resources: Fish farm manager, Other (Comment)(church pension-husband) Financial Screen Referred: No Living Expenses: Own Money Management: Spouse, Patient Does the patient have any problems obtaining your medications?: No Home Management: Both of them Patient/Family Preliminary Plans: Return home with husband assisting if needed. Pt wants to be able to do for herself and not need physcial assist. She is ready to start her day and begin rehabing from this stroke. Her husband is here to see her evaluations andf provide support. Social Work Anticipated Follow Up Needs: HH/OP, Support Group  Clinical Impression Pleasant motivated female who is willing to work hard to regain her independence from this stroke. Her husband is involved and plans on participating her own rehab and providing support. Will await teams' evaluations and work on discharge needs. May benefit from seeing neuro-psych while here.  Elease Hashimoto 06/06/2018, 8:54 AM

## 2018-06-06 NOTE — Evaluation (Signed)
Occupational Therapy Assessment and Plan  Patient Details  Name: Mary Mercado MRN: [id number] Date of Birth: [birth year]  OT Diagnosis: ataxia, hemiplegia affecting non-dominant side and muscle weakness (generalized) Rehab Potential: Rehab Potential (ACUTE ONLY): Excellent ELOS: 10-14 days   Today's Date: 06/06/2018 OT Individual Time: 0900-1000 & 1300-1400 OT Individual Time Calculation (min): 60 min   & 60 min  Problem List:  Patient Active Problem List   Diagnosis Date Noted  . Palpitations 06/05/2018  . Essential hypertension 06/05/2018  . Hyperlipidemia LDL goal <70 06/05/2018  . Acute ischemic right MCA stroke ([place]) 06/05/2018  . 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 SVT (supraventricular tachycardia) ([place]) 05/28/2015  . PVC's (premature ventricular contractions) 05/28/2015  . BENIGN POSITIONAL VERTIGO 06/16/2009  . ALLERGIC RHINITIS 06/16/2009    Past Medical History:  Past Medical History:  Diagnosis Date  . Allergy   . Vertigo, benign positional    Past Surgical History:  Past Surgical History:  Procedure Laterality Date  . Hemilaminectomy and microdiskectomy at L4-5 on the left.  12/10/2003    Assessment & Plan Clinical Impression: Patient is a 78 y.o. year old female with history of allergies, BPV, palpatationsotherwise in good health; who was admitted on 05/31/18 LUE weakness, numbness LUE/LLE and dizziness. UDS negative. CT head negative and she receive tPA. CTA head neck showed intracranial atherosclerosis severe right P2 and mild to moderate B-ICA stenosis and no large vessel occlusion. MRI brian done revealing acute right frontal parietal lobe infarct with small amount of hemorrhage in right parietal infarct and moderate small vessel disease. BLE dopplers were negative for DVT. TEE done revealing EF 60-65% with severely dilated right and left atrium, moderate TV prolapse, plaque in descending aorta, sclerotic AV and  no LAA thrombus.  Patient transferred to CIR on 06/05/2018 .    Patient currently requires min with basic self-care skills and IADL secondary to muscle weakness, impaired timing and sequencing, unbalanced muscle activation, motor apraxia and decreased coordination and decreased standing balance and decreased balance strategies.  Prior to hospitalization, patient could complete ADL, IADL with independent .  Patient will benefit from skilled intervention to decrease level of assist with basic self-care skills, increase independence with basic self-care skills and increase level of independence with iADL prior to discharge home with care partner.  Anticipate patient will require intermittent supervision and follow up outpatient.  OT - End of Session Activity Tolerance: Tolerates 30+ min activity with multiple rests Endurance Deficit: Yes Endurance Deficit Description: decreased standing and ambulation tolerance, fatigue noted at close of session OT Assessment Rehab Potential (ACUTE ONLY): Excellent OT Patient demonstrates impairments in the following area(s): Balance;Endurance;Motor;Sensory OT Basic ADL's Functional Problem(s): Eating;Grooming;Bathing;Dressing;Toileting OT Advanced ADL's Functional Problem(s): Simple Meal Preparation;Laundry;Light Housekeeping OT Transfers Functional Problem(s): Toilet;Tub/Shower OT Additional Impairment(s): Fuctional Use of Upper Extremity OT Plan OT Intensity: Minimum of 1-2 x/day, 45 to 90 minutes OT Frequency: 5 out of 7 days OT Duration/Estimated Length of Stay: 10-14 days OT Treatment/Interventions: Balance/vestibular training;Discharge planning;Self Care/advanced ADL retraining;Therapeutic Activities;UE/LE Coordination activities;Functional mobility training;Patient/family education;Therapeutic Exercise;Community reintegration;DME/adaptive equipment instruction;Neuromuscular re-education;UE/LE Strength taining/ROM OT Self Feeding Anticipated Outcome(s):  Independent OT Basic Self-Care Anticipated Outcome(s): independent OT Toileting Anticipated Outcome(s): modified independence OT Bathroom Transfers Anticipated Outcome(s): supervision OT Recommendation Patient destination: Home Follow Up Recommendations: Outpatient OT Equipment Recommended: To be determined Equipment Details: built in shower seat and handicap height toilet   Skilled Therapeutic Intervention  AM session - Patient in bed, husband present,  patient denies pain and is eager to start therapy program.  Reviewed role of OT, schedule, plan of care.  Patient and husband verbalize understanding.  Patient completed functional activities assessment to include bed mobility, transfers, shower, dressing/bathing and cognitive tasks with results as documented below - min/mod A overall.  Patient with excellent effort throughout session, good safety awareness although she requires cues due to limited sensation/proprioception on left side.  Fatigue and left side motor impairment limit ability to complete tasks at this time.  Reviewed goals for therapy, potential time frame and DME during this session as well.  Patient remained in the w/c with seat belt alarm set as her husband will assist with drying her hair during this timeframe.    PM session - Patient in bed upon arrival, denies pain but states "I did not realize how much these tasks would tire me out"  She is eager to participate, TEDS donned following conversation with PT and nursing due to orthostatic BP this am in PT session.  She completed toilet transfers and toileting tasks (x2) with min/mod A - difficulty with using left hand for pants up/down.  Ongoing assessment as documented below and dexterity activities completed, provided ideas for ongoing activities and  theraputty for use in room to improve left hand [place].  Patient returned to bed for a brief rest break before PT session this afternoon.  Bed alarm set and husband present.   OT  Evaluation Precautions/Restrictions  Precautions Precautions: Fall Precaution Comments: bleeding precaution Restrictions Weight Bearing Restrictions: No General Chart Reviewed: Yes Vital Signs Therapy Vitals Temp: 98.1 F (36.7 C) Pulse Rate: 63 Resp: 18 BP: 138/77 Patient Position (if appropriate): Lying Oxygen Therapy SpO2: 99 % O2 Device: Room Air Pain Pain Assessment Pain Scale: 0-10 Pain Score: 0-No pain Home Living/Prior Functioning Home Living Family/patient expects to be discharged to:: Private residence Living Arrangements: Spouse/significant other Available Help at Discharge: Family, Available PRN/intermittently Type of Home: House Home Access: Level entry Home Layout: Multi-level, Able to live on main level with bedroom/bathroom Bathroom Shower/Tub: Multimedia programmer: Handicapped height Bathroom Accessibility: Yes Additional Comments: reports fully handicapped bathroom  Lives With: Spouse IADL History Homemaking Responsibilities: Yes Occupation: Retired Prior Function Level of Independence: Independent with basic ADLs, Independent with homemaking with ambulation, Independent with homemaking with wheelchair, Independent with gait, Independent with transfers  Able to Take Stairs?: Yes Driving: Yes Vocation: Volunteer work Leisure: Hobbies-yes (Comment) Comments: active in church and home ADL ADL Eating: Set up Where Assessed-Eating: Wheelchair Grooming: Minimal assistance Where Assessed-Grooming: Wheelchair Upper Body Bathing: Supervision/safety Where Assessed-Upper Body Bathing: Shower Lower Body Bathing: Minimal assistance Where Assessed-Lower Body Bathing: Shower Upper Body Dressing: Minimal assistance Where Assessed-Upper Body Dressing: Wheelchair Lower Body Dressing: Minimal assistance Where Assessed-Lower Body Dressing: Wheelchair Toileting: Moderate assistance Where Assessed-Toileting: Glass blower/designer: Microbiologist Method: Arts development officer: Energy manager: Environmental education officer Method: Radiographer, therapeutic: Radio broadcast assistant, Grab bars Vision Baseline Vision/History: Cataracts;Wears glasses Wears Glasses: Reading only Patient Visual Report: No change from baseline Vision Assessment?: Yes Eye Alignment: Within Functional Limits Ocular Range of Motion: Within Functional Limits Alignment/Gaze Preference: Within Defined Limits Tracking/Visual Pursuits: Able to track stimulus in all quads without difficulty Saccades: Within functional limits Convergence: Within functional limits Visual Fields: No apparent deficits Additional Comments: continue to monitor - no deficit noted with ADL tasks Perception  Perception: Within Functional Limits Praxis Praxis: Intact Cognition Overall Cognitive  Status: Within Functional Limits for tasks assessed Arousal/Alertness: Awake/alert Orientation Level: Person;Place;Situation Person: Oriented Place: Oriented Situation: Oriented Year: 2020 Month: February Day of Week: Correct Memory: Appears intact Immediate Memory Recall: Sock;Blue;Bed Memory Recall: Sock;Blue;Bed Memory Recall Sock: Without Cue Memory Recall Blue: Without Cue Memory Recall Bed: Without Cue Attention: Focused;Sustained Focused Attention: Appears intact Sustained Attention: Appears intact Awareness: Appears intact Problem Solving: Appears intact Executive Function: Reasoning;Sequencing Reasoning: Appears intact Sequencing: Appears intact Safety/Judgment: Appears intact Sensation Sensation Light Touch: Impaired by gross assessment Peripheral sensation comments: left forearm and hand unable to detect temperature and impaired for light touch, able to sense light touch at base of left thumb and proximal forearm Light Touch Impaired Details: Impaired LUE Hot/Cold: Impaired by gross  assessment Proprioception: Impaired by gross assessment Stereognosis: Impaired by gross assessment Coordination Gross Motor Movements are Fluid and Coordinated: No Fine Motor Movements are Fluid and Coordinated: No Coordination and Movement Description: decreased timing with movements and coordination 9 Hole Peg Test: R = 27 sec, L = unable to pick up pegs Motor  Motor Motor - Skilled Clinical Observations: Left sensory and motor ataxia limit ability to grasp and maintain control of objects Mobility  Bed Mobility Bed Mobility: Sit to Supine;Supine to Sit Supine to Sit: Contact Guard/Touching assist Sit to Supine: Contact Guard/Touching assist  Trunk/Postural Assessment  Cervical Assessment Cervical Assessment: Within Functional Limits Thoracic Assessment Thoracic Assessment: Within Functional Limits Lumbar Assessment Lumbar Assessment: Within Functional Limits  Balance Balance Balance Assessed: Yes Static Sitting Balance Static Sitting - Balance Support: Feet supported Static Sitting - Level of Assistance: 5: Stand by assistance Static Standing Balance Static Standing - Balance Support: Right upper extremity supported Static Standing - Level of Assistance: 4: Min assist Extremity/Trunk Assessment RUE Assessment RUE Assessment: Within Functional Limits General Strength Comments: 4+/5 throughout LUE Assessment LUE Assessment: Within Functional Limits General Strength Comments: 4+/5 throughout          Box and Blocks R = 52. L = 32     Refer to Care Plan for Long Term Goals  Recommendations for other services: None    Discharge Criteria: Patient will be discharged from OT if patient refuses treatment 3 consecutive times without medical reason, if treatment goals not met, if there is a change in medical status, if patient makes no progress towards goals or if patient is discharged from hospital.  The above assessment, treatment plan, treatment alternatives and goals were  discussed and mutually agreed upon: by patient and by family  Carlos Levering 06/06/2018, 4:24 PM

## 2018-06-06 NOTE — Evaluation (Signed)
Physical Therapy Assessment and Plan  Patient Details  Name: Mary Mercado MRN: [id number] Date of Birth: 11-Jan-1941  PT Diagnosis: Abnormality of gait, Difficulty walking, Left Hemiparesis non-dominant, Impaired sensation and Muscle weakness Rehab Potential: Good ELOS: 10-14 days   Today's Date: 06/06/2018 PT Individual Time: 4098-1191 PT Individual Time Calculation (min): 62 min    Problem List:  Patient Active Problem List   Diagnosis Date Noted  . Palpitations 06/05/2018  . Essential hypertension 06/05/2018  . Hyperlipidemia LDL goal <70 06/05/2018  . Acute ischemic right MCA stroke ([place]) 06/05/2018  . 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 SVT (supraventricular tachycardia) ([place]) 05/28/2015  . PVC's (premature ventricular contractions) 05/28/2015  . BENIGN POSITIONAL VERTIGO 06/16/2009  . ALLERGIC RHINITIS 06/16/2009    Past Medical History:  Past Medical History:  Diagnosis Date  . Allergy   . Vertigo, benign positional    Past Surgical History:  Past Surgical History:  Procedure Laterality Date  . Hemilaminectomy and microdiskectomy at L4-5 on the left.  12/10/2003    Assessment & Plan Clinical Impression: Patient is a 78 y.o. year old female with recent admission to the hospital on 05/31/2018 with history of allergies, BPV, palpatationsotherwise in good health. She was admitted with LUE weakness, numbness LUE/LLE and dizziness. UDS negative. CT head negative and she receive tPA. CTA head neck showed intracranial atherosclerosis severe right P2 and mild to moderate B-ICA stenosis and no large vessel occlusion. MRI brain done revealing acute right frontal parietal lobe infarct with small amount of hemorrhage in right parietal infarct and moderate small vessel disease. BLE dopplers were negative for DVT. TEE done revealing EF 60-65% with severely dilated right and left atrium, moderate TV prolapse, plaque in descending aorta,  sclerotic AV and no LAA thrombus.  Dizziness and nausea has resolved but patient continues to be limited by left sensory deficits with decreased in coordination and mild weakness. Therapy ongoing and CIR recommended due to functional decline. Neurology recommends 30 day event monitor due to history of palpitations---if negative will need loop recorder to rule out A fib. Dr. Erlinda Hong felt that stroke was embolic due to unkown source and to continue DAPT X 3 weeks followed by Plavix alone..  Patient transferred to CIR on 06/05/2018 .   Patient currently requires mod with mobility secondary to muscle weakness, decreased cardiorespiratoy endurance, impaired timing and sequencing, ataxia and decreased coordination and decreased standing balance, decreased postural control and decreased balance strategies.  Prior to hospitalization, patient was independent  with mobility and lived with Spouse in a House home.  Home access is  Level entry.  Patient will benefit from skilled PT intervention to maximize safe functional mobility, minimize fall risk and decrease caregiver burden for planned discharge home with 24 hour supervision.  Anticipate patient will benefit from follow up OP at discharge.  PT - End of Session Activity Tolerance: Tolerates 30+ min activity with multiple rests Endurance Deficit: Yes Endurance Deficit Description: decreased standing and ambulation tolerance PT Assessment Rehab Potential (ACUTE/IP ONLY): Good PT Patient demonstrates impairments in the following area(s): Balance;Endurance;Sensory;Motor;Perception;Safety PT Transfers Functional Problem(s): Bed Mobility;Bed to Chair;Car;Furniture;Floor PT Locomotion Functional Problem(s): Ambulation;Wheelchair Mobility;Stairs PT Plan PT Intensity: Minimum of 1-2 x/day ,45 to 90 minutes PT Frequency: 5 out of 7 days PT Duration Estimated Length of Stay: 10-14 days PT Treatment/Interventions: Ambulation/gait training;Balance/vestibular  training;Cognitive remediation/compensation;Community reintegration;Discharge planning;Disease management/prevention;DME/adaptive equipment instruction;Functional electrical stimulation;Functional mobility training;Neuromuscular re-education;Pain management;Patient/family education;Psychosocial support;Skin care/wound management;Splinting/orthotics;Stair training;Therapeutic Activities;Therapeutic Exercise;UE/LE  Strength taining/ROM;UE/LE Coordination activities;Visual/perceptual remediation/compensation;Wheelchair propulsion/positioning PT Transfers Anticipated Outcome(s): supervision<>mod I with LRAD PT Locomotion Anticipated Outcome(s): mod I in home with LRAD, supervision in community with LRAD PT Recommendation Recommendations for Other Services: Vestibular eval;Therapeutic Recreation consult Therapeutic Recreation Interventions: Pet therapy;Stress management;Kitchen group;Outing/community reintergration Follow Up Recommendations: Outpatient PT Patient destination: Home Equipment Recommended: To be determined  Skilled Therapeutic Intervention Pt received for physical therapy evaluation sitting up in chair and agreeable to tx, pt's husband was present for session. Physical therapy evaluation was assessed, and pt was educated on CIR expectations and recovery process. Pt wheeled around unit total assist for time management. Pt performs car transfer in van-sized vehicle with stand-pivot from w/c and min assist and verbal cuing for hand placement during transfer. Pt propels w/c with BUE for 31 ft and min assist to turn chair and verbal cuing for use of LUE during propulsion. Therapist assessed sensation, coordination, and MMT with results listed below. Pt ambulates 15 ft without AD and mod assist with helper for w/c follow. Pt demonstrates ataxic gait with LLE and decreased weight shift to the R with shorter step length on R side. Pt begins to feel dizzy and nauseous during ambulation, and BP was assessed  in sitting at 163/86, HR 67. Pt was assessed by MD in gym before being wheeled back to room. Therapist checks BP again in room in sitting (149/90) and standing (123/91). Pt transfers stand-pivot w/c>bed with min assist and performs bed mobility with min assist. Pt left lying in bed with bed alarm on and all needs in reach with husband present to supervise.  No c/o pain during session.   PT Evaluation Precautions/Restrictions Precautions Precautions: Fall Precaution Comments: bleeding precaution Restrictions Weight Bearing Restrictions: No General Chart Reviewed: Yes Additional Pertinent History: vertigo, disc herniation surgery affecting LLE Family/Caregiver Present: Yes  Response to previous treatment: pt reports fatigue but able to participate Pain Pain Assessment Pain Scale: 0-10 Pain Score: 0-No pain Home Living/Prior Functioning Home Living Available Help at Discharge: Family;Available 24 hours/day Type of Home: House Home Access: Level entry Home Layout: Multi-level;Able to live on main level with bedroom/bathroom Bathroom Shower/Tub: Multimedia programmer: Handicapped height Bathroom Accessibility: Yes Additional Comments: reports fully handicapped bathroom  Lives With: Spouse Prior Function Level of Independence: Independent with basic ADLs;Independent with homemaking with ambulation;Independent with gait;Independent with transfers  Able to Take Stairs?: Yes Driving: Yes Vocation: Volunteer work Leisure: Hobbies-yes (Comment) Comments: active in church and home Vision/Perception  Vision - Assessment Eye Alignment: Within Functional Limits Ocular Range of Motion: Within Functional Limits Alignment/Gaze Preference: Within Defined Limits Tracking/Visual Pursuits: Able to track stimulus in all quads without difficulty Saccades: Within functional limits Convergence: Within functional limits Additional Comments: continue to monitor - no deficit noted with ADL  tasks Perception Perception: Within Functional Limits Praxis Praxis: Intact  Cognition Overall Cognitive Status: Within Functional Limits for tasks assessed Arousal/Alertness: Awake/alert Orientation Level: Oriented X4 Focused Attention: Appears intact Sustained Attention: Appears intact Awareness: Appears intact Safety/Judgment: Appears intact Sensation Sensation Light Touch: Impaired by gross assessment Light Touch Impaired Details: Impaired LLE;Impaired LUE Proprioception: Impaired by gross assessment Coordination Gross Motor Movements are Fluid and Coordinated: No Fine Motor Movements are Fluid and Coordinated: No Coordination and Movement Description: decreased timing with movements and coordination Finger Nose Finger Test: impaired coordination more pronounced L hand vs. R hand Heel Shin Test: decreased velocity BLE, mild ataxia on LLE Motor  Motor Motor: Other (comment);Ataxia(L hemiparesis) Motor - Skilled Clinical Observations: generalized  deconditioning  Mobility Bed Mobility Bed Mobility: Rolling Left;Sit to Sidelying Right(with bed rails) Rolling Left: Supervision/Verbal cueing Supine to Sit: Contact Guard/Touching assist Sit to Supine: Contact Guard/Touching assist Sit to Sidelying Right: Minimal Assistance - Patient > 75% Transfers Transfers: Stand Pivot Transfers;Sit to Stand Sit to Stand: Minimal Assistance - Patient > 75% Stand Pivot Transfers: Minimal Assistance - Patient > 75% Stand Pivot Transfer Details: Tactile cues for sequencing;Tactile cues for weight shifting;Verbal cues for sequencing;Verbal cues for precautions/safety;Verbal cues for technique Transfer (Assistive device): None Locomotion  Gait Ambulation: Yes Gait Assistance: 2 Helpers(mod assist + w/c follow) Gait Distance (Feet): 15 Feet Assistive device: None Gait Assistance Details: Manual facilitation for weight shifting;Verbal cues for sequencing Gait Gait: Yes Gait Pattern:  Impaired Gait Pattern: Step-to pattern;Decreased stance time - left;Decreased step length - right;Decreased weight shift to right;Ataxic;Lateral trunk lean to left Gait velocity: decreased Stairs / Additional Locomotion Stairs: No Wheelchair Mobility Wheelchair Mobility: Yes Wheelchair Assistance: Minimal assistance - Patient >75% Wheelchair Propulsion: Both upper extremities Wheelchair Parts Management: Needs assistance Distance: 31 ft  Trunk/Postural Assessment  Cervical Assessment Cervical Assessment: Within Functional Limits Thoracic Assessment Thoracic Assessment: Within Functional Limits Lumbar Assessment Lumbar Assessment: Within Functional Limits Postural Control Postural Control: Deficits on evaluation(poor weight shifting to R side during ambulation, decreased trunk control on L side in standing, delayed righting reactions)  Balance Balance Balance Assessed: Yes Static Sitting Balance Static Sitting - Balance Support: Feet supported Static Sitting - Level of Assistance: 5: Stand by assistance Static Standing Balance Static Standing - Balance Support: No upper extremity supported Static Standing - Level of Assistance: 3: Mod assist Extremity Assessment  RLE Assessment RLE Assessment: Within Functional Limits LLE Assessment LLE Assessment: Exceptions to [facility] LLE Strength Left Hip Flexion: 4/5 Left Knee Flexion: 4+/5 Left Knee Extension: 5/5 Left Ankle Dorsiflexion: 4+/5    Refer to Care Plan for Long Term Goals  Recommendations for other services: Therapeutic Recreation  Pet therapy, Kitchen group, Stress management and Outing/community reintegration  Discharge Criteria: Patient will be discharged from PT if patient refuses treatment 3 consecutive times without medical reason, if treatment goals not met, if there is a change in medical status, if patient makes no progress towards goals or if patient is discharged from hospital.  The above assessment, treatment  plan, treatment alternatives and goals were discussed and mutually agreed upon: by family  Coral Spikes 06/06/2018, 11:55 AM

## 2018-06-06 NOTE — Progress Notes (Signed)
Retta Diones, RN  Rehab Admission Coordinator  Physical Medicine and Rehabilitation  PMR Pre-admission  Signed  Date of Service:  06/05/2018 12:25 PM       Related encounter: ED to Hosp-Admission (Discharged) from 05/31/2018 in [facility]      Signed         Show:Clear all [x] Manual[x] Template[x] Copied  Added by: [x] Karl Bales Evalee Mutton, RN  [] Hover for details PMR Admission Coordinator Pre-Admission Assessment  Patient: Mary Mercado is an 78 y.o., female MRN: [id number] DOB: [birth year] Height: 5\' 5"  (165.1 cm) Weight: 64.5 kg                                                                                                                                                  Insurance Information HMO: No    PPO:       PCP:       IPA:       80/20:       OTHER:   PRIMARY:  Medicare A and B      Policy#: [id number]      Subscriber: patient CM Name:        Phone#:       Fax#:   Pre-Cert#:        Employer: Retired Benefits:  Phone #:       Name: Checked in [place] one source [place]. Date: 04/25/04     Deduct: $1408      Out of Pocket Max: none      Life Max: N/A CIR: 100%      SNF: 100 days Outpatient: 80%     Co-Pay: 20% Home Health: 100%      Co-Pay: none DME: 80%     Co-Pay: 20% Providers: patient's choice  SECONDARY: BCBS supplement      Policy#: [id number]      Subscriber: patient CM Name:        Phone#:       Fax#:   Pre-Cert#:        Employer: Retired Benefits:  Phone #: [phone]     Name:   Eff. 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 Patient lives with spouse independent prior to admission and active. Presented 05/31/2018 with left-sided weakness and decreased sensation as well as dizziness. She did have one episode of emesis. Cranial CT scan negative for acute changes. Patient did receive TPA. CT angiogram of head and neck no large vessel occlusion. Patient noted have elevated blood pressure placed on Cardene. 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 Small amount of hemorrhage in the right parietal infarction. Hold on anticoagulation due to hemorrhagic conversion. Tolerating a regular diet. Therapy evaluation completed with recommendations of physical medicine rehabilitation consult. TEE negative for source. Given hx palpitations, will do OP 30d monitor following IP rehab to look for source. If negative, place loop in the outpatient setting.   Complete NIHSS TOTAL: 2  Past Medical History      Past Medical History:  Diagnosis Date  . Allergy   . Vertigo, benign positional     Family History  family history includes Colon cancer in her father; Diabetes in her mother; Heart attack in her mother; Heart attack (age of onset: 28) in her brother.  Prior Rehab/Hospitalizations: No previous rehab.  Has the patient had major surgery during 100 days prior to admission? No. However, patient reports that she did have cataract surgery in 09/19.  Current Medications   Current Facility-Administered Medications:  .  0.9 %  sodium chloride infusion, , Intravenous, Continuous, Lelon Perla, MD, Last Rate: 40 mL/hr at 06/05/18 0435 .  aspirin EC tablet 81 mg, 81 mg, Oral, Daily, Lelon Perla, MD, 81 mg at 06/05/18 1053 .   atorvastatin (LIPITOR) tablet 40 mg, 40 mg, Oral, q1800, Lelon Perla, MD, 40 mg at 06/04/18 1756 .  clopidogrel (PLAVIX) tablet 75 mg, 75 mg, Oral, Daily, Lelon Perla, MD, 75 mg at 06/05/18 1053 .  enoxaparin (LOVENOX) injection 40 mg, 40 mg, Subcutaneous, Q24H, Crenshaw, Denice Bors, MD, 40 mg at 06/04/18 2144 .  ondansetron (ZOFRAN) injection 4 mg, 4 mg, Intravenous, Q6H PRN, Lelon Perla, MD, 4 mg at 05/31/18 2250 .  pantoprazole (PROTONIX) EC tablet 40 mg, 40 mg, Oral, Daily, Lelon Perla, MD, 40 mg at 06/05/18 1053 .  senna-docusate (Senokot-S) tablet 1 tablet, 1 tablet, Oral, QHS PRN, Lelon Perla, MD .  traMADol Veatrice Bourbon) tablet 50 mg, 50 mg, Oral, Q6H PRN, Lelon Perla, MD, 50 mg at 06/02/18 1315  Patients Current Diet:     Diet Order                  Diet regular Room service appropriate? Yes; Fluid consistency: Thin  Diet effective now               Precautions / Restrictions Precautions Precautions: Fall Restrictions Weight Bearing Restrictions: No   Has the patient had 2 or more falls or a fall with injury in the past year?No  Prior Activity Level Community (5-7x/wk): Martin Majestic out daily, is retired, was driving.  Home Assistive Devices / Equipment  Prior Device Use: Indicate devices/aids used by the patient prior to current illness, exacerbation or injury? None  Prior Functional Level Prior Function Level of Independence: Independent Comments: active in the church and home  Self Care: Did the  patient need help bathing, dressing, using the toilet or eating?  Independent  Indoor Mobility: Did the patient need assistance with walking from room to room (with or without device)? Independent  Stairs: Did the patient need assistance with internal or external stairs (with or without device)? Independent  Functional Cognition: Did the patient need help planning regular tasks such as shopping or remembering to take medications?  Independent  Current Functional Level Cognition  Arousal/Alertness: Awake/alert Overall Cognitive Status: Impaired/Different from baseline Orientation Level: Oriented X4 Following Commands: Follows one step commands consistently Safety/Judgement: Decreased awareness of deficits(Proprioceptive deficits in the RUE and RLE) General Comments: pt with impaired sensation on the L LE and UE impairing processing, sequencing, and causing inattention Attention: Focused, Sustained Focused Attention: Appears intact Sustained Attention: Appears intact Memory: Appears intact Awareness: Appears intact Problem Solving: Appears intact Executive Function: Reasoning, Sequencing Reasoning: Appears intact Sequencing: Appears intact Safety/Judgment: Appears intact    Extremity Assessment (includes Sensation/Coordination)  Upper Extremity Assessment: LUE deficits/detail LUE Deficits / Details: grossly 2/5 throughout shoulder and elbow, decreased digit ROM and weak grip strength; decreased sensation and impaired proprioception; edemous LUE LUE Sensation: decreased light touch, decreased proprioception LUE Coordination: decreased fine motor  Lower Extremity Assessment: Defer to PT evaluation RLE Deficits / Details: lifts against gravity and can hold against moderate pressure RLE Sensation: WNL LLE Deficits / Details: able to lift against gravity and can hold against minimal pressure LLE Sensation: decreased light touch, decreased proprioception LLE Coordination: decreased fine motor, decreased gross motor    ADLs  Overall ADL's : Needs assistance/impaired Eating/Feeding: Set up, Minimal assistance, Sitting Grooming: Oral care, Brushing hair, Standing, Minimal assistance Upper Body Bathing: Minimal assistance, Sitting, Bed level Upper Body Dressing : Minimal assistance, Sitting, Bed level General ADL Comments: Pt able to transfer to the EOB with supervison to start session, using rail and HOB  elevated 30 degrees.  Once sitting had her work on [employer] tasks and functional reaching with the LUE.  Pt exhibits sensory deficits in the LUE as well as decreased FM coordination for removing tops from items or for using bilaterally for folding a bed pad.  Educated pt and family on FM coordination tasks for the LUE.      Mobility  Overal bed mobility: Needs Assistance Bed Mobility: Supine to Sit, Sit to Supine Rolling: Supervision Sidelying to sit: Supervision, HOB elevated Supine to sit: Min assist Sit to supine: Min assist Sit to sidelying: Min assist General bed mobility comments: Pt needed min assist for lifting the LLE into the bed when laying back down.    Transfers  Overall transfer level: Needs assistance Equipment used: 1 person hand held assist Transfers: Sit to/from Stand Sit to Stand: Mod assist General transfer comment: Pt with decreased proprioception in the LLE resulting in knee buckling without awareness when standing at the sink as well as stepping too far with the LLEduring mobility     Ambulation / Gait / Stairs / Wheelchair Mobility  Ambulation/Gait Ambulation/Gait assistance: Mod assist, +2 safety/equipment Gait Distance (Feet): 120 Feet Assistive device: Rolling walker (2 wheeled) Gait Pattern/deviations: Step-through pattern, Ataxic(strong L Lateral lean) General Gait Details: pt with exaggerated stepping with L LE requiring max verbal cues to minimize step length, maintain L foot in neutral instead of internally rotated, pt with supination as well. Pt with onset of fatigue and L LE buckling requiring maxA to prevent fall and maintain upright standing. pt also requiring modA to maintain L UE gripped on walker. pt reports  she can not feel with L UE and LE even with PT assisting L LE placement., pt requiring max verbal cues to shift to the R to step with the L, max tactile cues and modA to facilitate weight shifting Gait velocity: slow    Posture / Balance  Balance Overall balance assessment: Needs assistance Sitting-balance support: Feet supported, No upper extremity supported Sitting balance-Leahy Scale: Good Postural control: Left lateral lean Standing balance support: During functional activity Standing balance-Leahy Scale: Poor Standing balance comment: Mod assist needed for dynamic standing balance without assistive device.     Special needs/care consideration BiPAP/CPAP No CPM No Continuous Drip IV No Dialysis No    Life Vest No Oxygen No Special Bed No Trach Size No Wound Vac (area) No    Skin No                        Bowel mgmt: Last BM 06/03/18 Bladder mgmt: Using [facility] with assistance Diabetic mgmt No    Previous Home Environment Living Arrangements: Spouse/significant other  Lives With: Spouse Available Help at Discharge: Family, Skilled Nursing Facility Type of Home: House Home Layout: One level Home Access: Level entry Bathroom Shower/Tub: Multimedia programmer: Handicapped height Additional Comments: reports fully handicapped bathroom  Discharge Living Setting Plans for Discharge Living Setting: Patient's home, Lives with (comment), Other (Comment) Type of Home at Discharge: Other (Comment)(condominium) Discharge Home Layout: Two level, Able to live on main level with bedroom/bathroom Alternate Level Stairs-Number of Steps: Flight Discharge Home Access: Level entry(Very small 1-2 inch entry.) Discharge Bathroom Shower/Tub: Walk-in shower, Door Discharge Bathroom Toilet: Handicapped height Discharge Bathroom Accessibility: Yes How Accessible: Accessible via wheelchair, Accessible via walker Does the patient have any problems obtaining your medications?: No  Social/Family/Support Systems Patient Roles: Spouse, Parent(Has a husband and 2 children.) Contact Information: Namita Yearwood - husband Anticipated Caregiver: Husband Anticipated Caregiver's Contact Information: [name] - husband -  [phone] Ability/Limitations of Caregiver: Husband is retired and can assist as needed. 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 Dietary Needs: Regular diet, thin liquids Equipment Needs: TBD Pt/Family Agrees to Admission and willing to participate: Yes Program Orientation Provided & Reviewed with Pt/Caregiver Including Roles  & Responsibilities: Yes  Decrease burden of Care through IP rehab admission: N/A  Possible need for SNF placement upon discharge: Not anticipated  Patient Condition: This patient's medical and functional status has changed since the consult dated: 06/01/18 in which the Rehabilitation Physician determined and documented that the patient's condition is appropriate for intensive rehabilitative care in an inpatient rehabilitation facility. See "History of Present Illness" (above) for medical update. Functional changes are: Currently requiring mod assist +2 to ambulate 120 feet RW. Patient's medical and functional status update has been discussed with the Rehabilitation physician and patient remains appropriate for inpatient rehabilitation. Will admit to inpatient rehab today.  Preadmission Screen Completed By:  Retta Diones, 06/05/2018 12:47 PM ______________________________________________________________________   Discussed status with Dr. Naaman Plummer on 06/05/18 at 1247 and received telephone approval for admission today.  Admission Coordinator:  Retta Diones, time 1247/Date 06/05/18           Cosigned by: Meredith Staggers, MD at 06/05/2018 1:39  PM  Revision History

## 2018-06-06 NOTE — Progress Notes (Addendum)
Physical Therapy Session Note  Patient Details  Name: Mary Mercado MRN: [id number] Date of Birth: 21-Nov-1940  Today's Date: 06/06/2018 PT Individual Time: 1421-1449 PT Individual Time Calculation (min): 28 min   Short Term Goals: Week 1:  PT Short Term Goal 1 (Week 1): Pt will perform transfers with CGA PT Short Term Goal 2 (Week 1): Pt will ambulate 150 ft with RW and min assist PT Short Term Goal 3 (Week 1): Pt will ascend/descend 4 stairs with 2 handrails and min assist  Skilled Therapeutic Interventions/Progress Updates:  Pt received lying in bed with husband present and agreeable to tx. Therapist checks pt BP in supine (110/74), sitting (128/98), standing (115/81), and standing at 3 min (132/84). Pt transfers stand-pivot bed<>w/c with min assist and verbal cuing for hand placement during transfer. Pt wheeled around unit for time management. Pt ascends/descends 4 6" stairs with 2 handrails and min assist (therapist leg in front of LLE in case of L knee buckling) with verbal cuing for correct sequencing in step-to pattern and LUE attention. Pt left lying in bed with bed alarm on with husband present to supervise and all needs in reach.  No c/o pain during session, standing rest breaks provided due to mild dizziness in standing and proceeded with session when dizziness subsided.   Therapy Documentation Precautions:  Precautions Precautions: Fall Precaution Comments: bleeding precaution Restrictions Weight Bearing Restrictions: No  Therapy/Group: Individual Therapy  Adarryl Goldammer 06/06/2018, 4:15 PM

## 2018-06-06 NOTE — Patient Care Conference (Signed)
Inpatient RehabilitationTeam Conference and Plan of Care Update Date: 06/06/2018   Time: 10:45 Am    Patient Name: Mary Mercado      Medical Record Number: [id number]  Date of Birth: 30-Jan-1941 Sex: Female         Room/Bed: 4W17C/4W17C-01 Payor Info: Payor: MEDICARE / Plan: MEDICARE PART A AND B / Product Type: *No Product type* /    Admitting Diagnosis: Debility  Admit Date/Time:  06/05/2018  2:17 PM Admission Comments: No comment available   Primary Diagnosis:  <principal problem not specified> Principal Problem: <principal problem not specified>  Patient Active Problem List   Diagnosis Date Noted  . Palpitations 06/05/2018  . Essential hypertension 06/05/2018  . Hyperlipidemia LDL goal <70 06/05/2018  . Acute ischemic right MCA stroke ([place]) 06/05/2018  . Stroke (cerebrum) ([place]) - R MCA s/p tPA, embolic, source unknown 76/72/0947  . SVT (supraventricular tachycardia) ([place]) 05/28/2015  . PVC's (premature ventricular contractions) 05/28/2015  . BENIGN POSITIONAL VERTIGO 06/16/2009  . ALLERGIC RHINITIS 06/16/2009    Expected Discharge Date:    Team Members Present: Physician leading conference: Dr. [name] Social Worker Present: Ovidio Kin, LCSW Nurse Present: Rayetta Pigg, RN PT Present: Lavone Nian, PT OT Present: Other (comment)(Stacy Kinder-OT) SLP Present: Charolett Bumpers, SLP PPS Coordinator present : Gunnar Fusi     Current Status/Progress Goal Weekly Team Focus  Medical   Left hemiparesis, left neglect, left hemisensory deficits  Reduce fall risk, manage blood pressures  Address labile blood pressures initiate rehab program   Bowel/Bladder   continent of B/B LBM 02/11  remain continent of B/B with normal bowel pattern  assist toilet prn with mod assist   Swallow/Nutrition/ Hydration             ADL's     eval pending        Mobility   mod assist gait x 15 ft without AD, min assist transfers, fatigues quickly, orthostatic on eval   supervision in community & stair negotiation, mod I in home with LRAD  L UE/LE strengthening, transfers, gait, balance, endurance, stair negotiation, pt/family education   Communication             Safety/Cognition/ Behavioral Observations            Pain   denies any pain at present  free of pain  assess pain q shift and prn   Skin   Left arm edema ecchymosis   skin remain intact to s/s of infection resolution of current issue         *See Care Plan and progress notes for long and short-term goals.     Barriers to Discharge  Current Status/Progress Possible Resolutions Date Resolved   Physician    Medical stability     Initial eval's in progress  Continue rehab program      Nursing                  PT                    OT                  SLP                SW                Discharge Planning/Teaching Needs:    Home with husband who is retired and able to assist with her care. Here today for evaluations  Team Discussion:  New patient setting goals supervision-mod/i level. Has some balance issues and L-neglect in arm than leg. Medically adjusting meds. ELOS 10-14 days  Revisions to Treatment Plan:  New eval    Continued Need for Acute Rehabilitation Level of Care: The patient requires daily medical management by a physician with specialized training in physical medicine and rehabilitation for the following conditions: Daily direction of a multidisciplinary physical rehabilitation program to ensure safe treatment while eliciting the highest outcome that is of practical value to the patient.: Yes Daily medical management of patient stability for increased activity during participation in an intensive rehabilitation regime.: Yes Daily analysis of laboratory values and/or radiology reports with any subsequent need for medication adjustment of medical intervention for : Neurological problems;Blood pressure problems   I attest that I was present, lead the team  conference, and concur with the assessment and plan of the team.   Elease Hashimoto 06/06/2018, 1:15 PM

## 2018-06-07 ENCOUNTER — Inpatient Hospital Stay (HOSPITAL_COMMUNITY): Payer: Medicare Other | Admitting: Occupational Therapy

## 2018-06-07 ENCOUNTER — Inpatient Hospital Stay (HOSPITAL_COMMUNITY): Payer: Medicare Other | Admitting: Physical Therapy

## 2018-06-07 NOTE — Progress Notes (Signed)
Physical Therapy Session Note  Patient Details  Name: Mary Mercado MRN: [id number] Date of Birth: 1940/05/11  Today's Date: 06/07/2018 PT Individual Time:1605-1700   55 min   Short Term Goals: Week 1:  PT Short Term Goal 1 (Week 1): Pt will perform transfers with CGA PT Short Term Goal 2 (Week 1): Pt will ambulate 150 ft with RW and min assist PT Short Term Goal 3 (Week 1): Pt will ascend/descend 4 stairs with 2 handrails and min assist  Skilled Therapeutic Interventions/Progress Updates:   Pt received sitting in WC and agreeable to PT. Pt reports need for urination. Transported to bathroom in [place]. Stand pivot trasnfer to toilet. Hand hygiene from [place]. WC mobility completed x 129f with supervision assist and moderate cues for improved coordination of BUE and turning technique. . Orthostatic vital signs. Sitting: 128/88. Standing 106/86. Standing at 2 min 126/100. Pt asymptomatic. Gait training with RW 2x 566fwith min assist from PT with min cues for prevent scissoring on the L. Prolonged rest break following each bout of gait training.  Standing balacnce and coordination re-training. Foot taps on 6 inch step 2 x 10 and fpoot taps on 6 inch step with target. Min cues for midline orientation when standing on the L and improved awareness of LL with swing.  Patient returned to room and [facility] and all needs met.         Therapy Documentation Precautions:  Precautions Precautions: Fall Precaution Comments: bleeding precautions Restrictions Weight Bearing Restrictions: No    Vital Signs: Therapy Vitals Temp: 98.7 F (37.1 C) Temp Source: Oral Pulse Rate: 71 Resp: 16 BP: 139/84 Patient Position (if appropriate): Lying Oxygen Therapy SpO2: 100 % O2 Device: Room Air Pain: Pain Assessment Pain Scale: 0-10 Pain Score: 0-No pain    Therapy/Group: Individual Therapy  AuLorie Phenix/13/2020, 4:52 PM

## 2018-06-07 NOTE — Progress Notes (Signed)
Physical Therapy Session Note  Patient Details  Name: Mary Mercado MRN: [id number] Date of Birth: January 11, 1941  Today's Date: 06/07/2018 PT Individual Time: 0800-0906 PT Individual Time Calculation (min): 66 min   Short Term Goals: Week 1:  PT Short Term Goal 1 (Week 1): Pt will perform transfers with CGA PT Short Term Goal 2 (Week 1): Pt will ambulate 150 ft with RW and min assist PT Short Term Goal 3 (Week 1): Pt will ascend/descend 4 stairs with 2 handrails and min assist  Skilled Therapeutic Interventions/Progress Updates:   Pt received toileting and agreeable to therapy, no c/o pain. Min assist transfer back to w/c and for standing balance while pt performed pericare and LE garment management w/ supervision. Set-up assist to wash face, brush teeth, and brush hair from seated level at sink. Donned knee-high TEDs, total assist. Total assist w/c transport to/from therapy gym. Worked on gait training and overall tolerance to upright activity. Pt declined dizziness at rest, stating she ate a good breakfast and feels more rested today. Ambulated 15' w/ HHA x1 and min assist for upright balance. Pt declined dizziness after gait bout, but reported feeling like her heart was racing. Vitals as listed below in seated, HR WNL. Standing BP 104/75 and HR 92 bpm, continued to decline dizziness and stated she felt "trembly". Deferred further standing activity. Pt self-propelled w/c 100' w/ BUEs to work on coordination and endurance training, increased time w/ supervision. NuStep 5 min @ level 3 w/ BLEs to work on LE reciprocal movement pattern, tactile and verbal cues throughout for neutral LLE alignment. Returned to room via w/c and ended session in supine, all needs in reach. Prolonged seated rest breaks throughout session 2/2 feelings of shakiness and fatigue w/ all activity. Agreeable to rest in supine until next session @ 10:30.   Therapy Documentation Precautions:  Precautions Precautions:  Fall Precaution Comments: bleeding precaution Restrictions Weight Bearing Restrictions: No Vital Signs: Therapy Vitals Temp: 98.5 F (36.9 C) Temp Source: Oral Pulse Rate: 83 Resp: 18 BP: 114/74 Patient Position (if appropriate): Sitting Oxygen Therapy SpO2: 100 % O2 Device: Room Air Pain: Pain Assessment Pain Scale: 0-10 Pain Score: 0-No pain  Therapy/Group: Individual Therapy  Shannon Balthazar K [name] 06/07/2018, 9:08 AM

## 2018-06-07 NOTE — Progress Notes (Signed)
Occupational Therapy Session Note  Patient Details  Name: Mary Mercado MRN: [id number] Date of Birth: 1941/02/04  Today's Date: 06/07/2018 OT Individual Time: 6553-7482 OT Individual Time Calculation (min): 77 min    Short Term Goals: Week 1:  OT Short Term Goal 1 (Week 1): Patient will increase functional transfers with ambulation using RW to CS/CG level OT Short Term Goal 2 (Week 1): Patient will increase dressing and bathing to CG/CS seated OT Short Term Goal 3 (Week 1): Patient will increase left hand dexterity to be able to complete shoe tying tasks  OT Short Term Goal 4 (Week 1): Patient will complete light HM tasks with CG A   Skilled Therapeutic Interventions/Progress Updates:    Pt completed bathing, dressing, and grooming tasks during session.  Min assist for ambulation to the shower with hand held assist.  She was able to remove all clothing prior to shower with min assist except for TEDs which required max.  Bathing completed sit to stand with min assist and min instructional cueing to maintain visual attention on the LUE when attempting to hold the washcloth.  She exhibited two drops of the washcloth during bathing from the left hand.  Once shower was finished pt ambulated out to the sink for grooming and dressing tasks.  She donned a pullover sports shirt with min assist and then a pullover shirt with supervision.  She then donned her LB clothing with min assist and min instructional cueing.  She did get both legs in the right leg hole of her pants on one occasion and needed min instructional cueing for re-direction.  Max assist to [employer] and for tying her shoes.  She was able to blow dry her hair and brush it using the LUE to assist with both tasks with supervision.  Finished session with pt in the wheelchair with call button and phone in reach and spouse present.  Chair alarm in place as well.    Therapy Documentation Precautions:  Precautions Precautions: Fall Precaution  Comments: bleeding precautions Restrictions Weight Bearing Restrictions: No  Pain: Pain Assessment Pain Scale: 0-10 Pain Score: 0-No pain ADL: See Care Tool Section for some details of ADL  Therapy/Group: Individual Therapy  Dung Salinger OTR/L 06/07/2018, 12:03 PM

## 2018-06-07 NOTE — Progress Notes (Addendum)
[place] PHYSICAL MEDICINE & REHABILITATION PROGRESS NOTE   Subjective/Complaints:  Did better in PT today, wore TED hose  ROS: Denies-pain, breathing problems, bowel problem, bladder issues  Objective:   No results found. Recent Labs    06/06/18 0601  WBC 7.0  HGB 12.6  HCT 38.6  PLT 224   Recent Labs    06/06/18 0601  NA 140  K 3.9  CL 106  CO2 28  GLUCOSE 104*  BUN 13  CREATININE 0.67  CALCIUM 9.0    Intake/Output Summary (Last 24 hours) at 06/07/2018 0817 Last data filed at 06/07/2018 0800 Gross per 24 hour  Intake 960 ml  Output -  Net 960 ml     Physical Exam: Vital Signs Blood pressure (!) 163/80, pulse 62, temperature 98.5 F (36.9 C), temperature source Oral, resp. rate 18, height 5\' 5"  (1.651 m), weight 64.2 kg, SpO2 100 %.  General: No acute distress Mood and affect are appropriate Heart: Regular rate and rhythm no rubs murmurs or extra sounds Lungs: Clear to auscultation, breathing unlabored, no rales or wheezes Abdomen: Positive bowel sounds, soft nontender to palpation, nondistended Extremities: No clubbing, cyanosis, or edema Skin: No evidence of breakdown, no evidence of rash Neurologic: Cranial nerves II through XII intact, motor strength is 5/5 in RIght and 4/5 Left  deltoid, bicep, tricep, grip, hip flexor, knee extensors, ankle dorsiflexor and plantar flexor Sensory examreduced  sensation to light touch and proprioception inLeft  upper and lower extremities Left neglect on confrontation testing Musculoskeletal: Full range of motion in all 4 extremities. No joint swelling    Assessment/Plan: 1. Functional deficits secondary to RIght fronto parietal infarct which require 3+ hours per day of interdisciplinary therapy in a comprehensive inpatient rehab setting.  Physiatrist is providing close team supervision and 24 hour management of active medical problems listed below.  Physiatrist and rehab team continue to assess barriers to  discharge/monitor patient progress toward functional and medical goals  Care Tool:  Bathing    Body parts bathed by patient: Right arm, Left arm, Chest, Abdomen, Front perineal area, Right upper leg, Left upper leg, Right lower leg, Left lower leg, Face   Body parts bathed by helper: Buttocks     Bathing assist Assist Level: Minimal Assistance - Patient > 75%     Upper Body Dressing/Undressing Upper body dressing   What is the patient wearing?: Pull over shirt    Upper body assist Assist Level: Minimal Assistance - Patient > 75%    Lower Body Dressing/Undressing Lower body dressing      What is the patient wearing?: Underwear/pull up, Pants     Lower body assist Assist for lower body dressing: Minimal Assistance - Patient > 75%     Toileting Toileting    Toileting assist Assist for toileting: Moderate Assistance - Patient 50 - 74%     Transfers Chair/bed transfer  Transfers assist     Chair/bed transfer assist level: Minimal Assistance - Patient > 75%     Locomotion Ambulation   Ambulation assist      Assist level: 2 helpers(mod assist + w/c follow)   Max distance: 15 ft   Walk 10 feet activity   Assist     Assist level: 2 helpers(mod assist + w/c follow)     Walk 50 feet activity   Assist Walk 50 feet with 2 turns activity did not occur: Safety/medical concerns         Walk 150 feet activity  Assist Walk 150 feet activity did not occur: Safety/medical concerns         Walk 10 feet on uneven surface  activity   Assist Walk 10 feet on uneven surfaces activity did not occur: Safety/medical concerns         Wheelchair     Assist Will patient use wheelchair at discharge?: No Type of Wheelchair: Manual    Wheelchair assist level: Minimal Assistance - Patient > 75% Max wheelchair distance: 31 ft    Wheelchair 50 feet with 2 turns activity    Assist    Wheelchair 50 feet with 2 turns activity did not occur:  Safety/medical concerns       Wheelchair 150 feet activity     Assist Wheelchair 150 feet activity did not occur: Safety/medical concerns        Medical Problem List and Plan: 1.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 Plavix to begin on 06/06/2018 2. DVT Prophylaxis/Anticoagulation: Pharmaceutical:Lovenox, CBC normal 2/12 3.Headaches/Pain Management:tylenol prn. -tramadol for more severe headache 4. Mood:LCSW to follow for evaluation and support. 5. Neuropsych: This patientiscapable of making decisions on herown behalf. 6. Skin/Wound Care:routine pressure relief measures. 7. Fluids/Electrolytes/Nutrition:Monitor I/O. CMET normal except mildly low alb- intake ok will d/c IV 8. HTN: avoid over treatment, but may need to consider medication if BP remains consistently elevated. Monitor for now Vitals:   06/06/18 2019 06/07/18 0543  BP: 133/89 (!) 163/80  Pulse: 66 62  Resp: 17 18  Temp: 98.6 F (37 C) 98.5 F (36.9 C)  SpO2: 96% 283%  systolic elevated 6/62  9. Left arm hematoma after infiltration of IV TPA- no drop in hgb-monitor 10.  Dizziness when walking suspect orthostatic changes, PT measured BP of 947 systolic with standing today, cont TED, check Ortho vitals  LOS: 2 days A FACE TO FACE EVALUATION WAS PERFORMED  Charlett Blake 06/07/2018, 8:17 AM

## 2018-06-08 ENCOUNTER — Inpatient Hospital Stay (HOSPITAL_COMMUNITY): Payer: Medicare Other | Admitting: Physical Therapy

## 2018-06-08 ENCOUNTER — Inpatient Hospital Stay (HOSPITAL_COMMUNITY): Payer: Medicare Other | Admitting: Occupational Therapy

## 2018-06-08 NOTE — IPOC Note (Signed)
Overall Plan of Care [facility]) Patient Details Name: Mary Mercado MRN: [id number] DOB: 13-Dec-1940  Admitting Diagnosis: <principal problem not specified>  Hospital Problems: Active Problems:   Acute ischemic right MCA stroke [facility])     Functional Problem List: Nursing Endurance, Medication Management, Motor, Skin Integrity  PT Balance, Endurance, Sensory, Motor, Perception, Safety  OT Balance, Endurance, Motor, Sensory  SLP    TR         Basic ADL's: OT Eating, Grooming, Bathing, Dressing, Toileting     Advanced  ADL's: OT Simple Meal Preparation, Laundry, Light Housekeeping     Transfers: PT Bed Mobility, Bed to Chair, Car, [employer], Futures trader, Metallurgist: PT Ambulation, Emergency planning/management officer, Stairs     Additional Impairments: OT Fuctional Use of Upper Extremity  SLP        TR      Anticipated Outcomes Item Anticipated Outcome  Self Feeding Independent  Swallowing      Basic self-care  independent  Toileting  modified independence   Bathroom Transfers supervision  Bowel/Bladder  Pt will manage bowel and bladder with mod I assist at discharge   Transfers  supervision<>mod I with LRAD  Locomotion  mod I in home with LRAD, supervision in community with LRAD  Communication     Cognition     Pain  Pt will manage pain at 3 or less on a scale of 0-10.   Safety/Judgment  Pt will remain free of falls and injury with min assist while in rehab.    Therapy Plan: PT Intensity: Minimum of 1-2 x/day ,45 to 90 minutes PT Frequency: 5 out of 7 days PT Duration Estimated Length of Stay: 10-14 days OT Intensity: Minimum of 1-2 x/day, 45 to 90 minutes OT Frequency: 5 out of 7 days OT Duration/Estimated Length of Stay: 10-14 days      Team Interventions: Nursing Interventions Patient/Family Education, Disease Management/Prevention, Medication Management, Skin Care/Wound Management, Discharge Planning  PT interventions Ambulation/gait  training, Balance/vestibular training, Cognitive remediation/compensation, Community reintegration, Discharge planning, Disease management/prevention, DME/adaptive equipment instruction, Functional electrical stimulation, Functional mobility training, Neuromuscular re-education, Pain management, Patient/family education, Psychosocial support, Skin care/wound management, Splinting/orthotics, Stair training, Therapeutic Activities, Therapeutic Exercise, UE/LE Strength taining/ROM, UE/LE Coordination activities, Visual/perceptual remediation/compensation, Wheelchair propulsion/positioning  OT Interventions Training and development officer, Discharge planning, Self Care/advanced ADL retraining, Therapeutic Activities, UE/LE Coordination activities, Functional mobility training, Patient/family education, Therapeutic Exercise, Community reintegration, Engineer, drilling, Neuromuscular re-education, UE/LE Strength taining/ROM  SLP Interventions    TR Interventions    SW/CM Interventions Discharge Planning, Psychosocial Support, Patient/Family Education   Barriers to Discharge MD  Medical stability  Nursing      PT      OT      SLP      SW       Team Discharge Planning: Destination: PT-Home ,OT- Home , SLP-  Projected Follow-up: PT-Outpatient PT, OT-  Outpatient OT, SLP-  Projected Equipment Needs: PT-To be determined, OT- To be determined, SLP-  Equipment Details: PT- , OT-built in shower seat and handicap height toilet Patient/family involved in discharge planning: PT- Patient, Family member/caregiver,  OT-Patient, Family member/caregiver, SLP-   MD ELOS: 7-11d Medical Rehab Prognosis:  Good Assessment:  78 year old female with history of allergies, BPV, palpatationsotherwise in good health; who was admitted on 05/31/18 LUE weakness, numbness LUE/LLE and dizziness. UDS negative. CT head negative and she receive tPA. CTA head neck showed intracranial atherosclerosis severe right P2  and mild to  moderate B-ICA stenosis and no large vessel occlusion. MRI brian done revealing acute right frontal parietal lobe infarct with small amount of hemorrhage in right parietal infarct and moderate small vessel disease. BLE dopplers were negative for DVT. TEE done revealing EF 60-65% with severely dilated right and left atrium, moderate TV prolapse, plaque in descending aorta, sclerotic AV and no LAA thrombus.  Dizziness and nausea has resolved but patient continues to be limited by left sensory deficits with decreased in coordination and mild weakness. Therapy ongoing and CIR recommended due to functional decline. Neurology recommends 30 day event monitor due to history of palpitations---if negative will need loop recorder to rule out A fib. Dr. 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    Now requiring 24/7 Rehab RN,MD, as well as CIR level PT, OT and SLP.  Treatment team will focus on ADLs and mobility with goals set at [place] See Team Conference Notes for weekly updates to the plan of care

## 2018-06-08 NOTE — Progress Notes (Signed)
[place] PHYSICAL MEDICINE & REHABILITATION PROGRESS NOTE   Subjective/Complaints:  Left elbow "burning " last noc, thought she may be getting her feeling back  ROS: Denies-pain, breathing problems, bowel problem, bladder issues  Objective:   No results found. Recent Labs    06/06/18 0601  WBC 7.0  HGB 12.6  HCT 38.6  PLT 224   Recent Labs    06/06/18 0601  NA 140  K 3.9  CL 106  CO2 28  GLUCOSE 104*  BUN 13  CREATININE 0.67  CALCIUM 9.0    Intake/Output Summary (Last 24 hours) at 06/08/2018 0817 Last data filed at 06/07/2018 1200 Gross per 24 hour  Intake 480 ml  Output -  Net 480 ml     Physical Exam: Vital Signs Blood pressure 135/70, pulse 64, temperature 97.6 F (36.4 C), temperature source Oral, resp. rate 12, height 5\' 5"  (1.651 m), weight 64.2 kg, SpO2 100 %.  General: No acute distress Mood and affect are appropriate Heart: Regular rate and rhythm no rubs murmurs or extra sounds Lungs: Clear to auscultation, breathing unlabored, no rales or wheezes Abdomen: Positive bowel sounds, soft nontender to palpation, nondistended Extremities: No clubbing, cyanosis, or edema Skin: No evidence of breakdown, no evidence of rash Neurologic: Cranial nerves II through XII intact, motor strength is 5/5 in RIght and 4/5 Left  deltoid, bicep, tricep, grip, hip flexor, knee extensors, ankle dorsiflexor and plantar flexor Sensory examreduced  sensation to light touch and proprioception inLeft  upper and lower extremities Left neglect on confrontation testing Musculoskeletal: Full range of motion in all 4 extremities. No joint swelling    Assessment/Plan: 1. Functional deficits secondary to RIght fronto parietal infarct which require 3+ hours per day of interdisciplinary therapy in a comprehensive inpatient rehab setting.  Physiatrist is providing close team supervision and 24 hour management of active medical problems listed below.  Physiatrist and rehab team  continue to assess barriers to discharge/monitor patient progress toward functional and medical goals  Care Tool:  Bathing    Body parts bathed by patient: Right arm, Left arm, Chest, Abdomen, Front perineal area, Right upper leg, Left upper leg, Right lower leg, Left lower leg, Face, Buttocks   Body parts bathed by helper: Buttocks     Bathing assist Assist Level: Minimal Assistance - Patient > 75%     Upper Body Dressing/Undressing Upper body dressing   What is the patient wearing?: Pull over shirt    Upper body assist Assist Level: Supervision/Verbal cueing    Lower Body Dressing/Undressing Lower body dressing      What is the patient wearing?: Pants     Lower body assist Assist for lower body dressing: Minimal Assistance - Patient > 75%     Toileting Toileting    Toileting assist Assist for toileting: Minimal Assistance - Patient > 75%     Transfers Chair/bed transfer  Transfers assist     Chair/bed transfer assist level: Minimal Assistance - Patient > 75%     Locomotion Ambulation   Ambulation assist      Assist level: Minimal Assistance - Patient > 75% Assistive device: Walker-rolling Max distance: 50   Walk 10 feet activity   Assist     Assist level: Minimal Assistance - Patient > 75% Assistive device: Walker-rolling   Walk 50 feet activity   Assist Walk 50 feet with 2 turns activity did not occur: Safety/medical concerns  Assist level: Minimal Assistance - Patient > 75% Assistive device: Walker-rolling  Walk 150 feet activity   Assist Walk 150 feet activity did not occur: Safety/medical concerns         Walk 10 feet on uneven surface  activity   Assist Walk 10 feet on uneven surfaces activity did not occur: Safety/medical concerns         Wheelchair     Assist Will patient use wheelchair at discharge?: No Type of Wheelchair: Manual    Wheelchair assist level: Supervision/Verbal cueing Max wheelchair  distance: 150    Wheelchair 50 feet with 2 turns activity    Assist    Wheelchair 50 feet with 2 turns activity did not occur: Safety/medical concerns   Assist Level: Supervision/Verbal cueing   Wheelchair 150 feet activity     Assist Wheelchair 150 feet activity did not occur: Safety/medical concerns   Assist Level: Supervision/Verbal cueing    Medical Problem List and Plan: 1.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 Plavix to begin on 06/06/2018 2. DVT Prophylaxis/Anticoagulation: Pharmaceutical:Lovenox, CBC normal 2/12 3.Headaches/Pain Management:tylenol prn. -tramadol for more severe headache 4. Mood:LCSW to follow for evaluation and support. 5. Neuropsych: This patientiscapable of making decisions on herown behalf. 6. Skin/Wound Care:routine pressure relief measures. 7. Fluids/Electrolytes/Nutrition:Monitor I/O. CMET normal except mildly low alb- intake ok will d/c IV 8. HTN: avoid over treatment, but may need to consider medication if BP remains consistently elevated. Monitor for now Vitals:   06/07/18 1957 06/08/18 0515  BP: 124/81 135/70  Pulse: 68 64  Resp: 15 12  Temp: 97.7 F (36.5 C) 97.6 F (36.4 C)  SpO2: 98% 100%  controlled 2/14  9. Left arm hematoma after infiltration of IV TPA- no drop in hgb-monitor Dysesthesia may be related to swelling 10.  Dizziness when walking suspect orthostatic changes,using TEDs LOS: 3 days A FACE TO FACE EVALUATION WAS PERFORMED  Charlett Blake 06/08/2018, 8:17 AM

## 2018-06-08 NOTE — Progress Notes (Signed)
Physical Therapy Session Note  Patient Details  Name: Mary Mercado MRN: [id number] Date of Birth: 16-Dec-1940  Today's Date: 06/08/2018 PT Individual Time: [phone] and [phone] PT Individual Time Calculation (min): 56 min and 61 min  Short Term Goals: Week 1:  PT Short Term Goal 1 (Week 1): Pt will perform transfers with CGA PT Short Term Goal 2 (Week 1): Pt will ambulate 150 ft with RW and min assist PT Short Term Goal 3 (Week 1): Pt will ascend/descend 4 stairs with 2 handrails and min assist  Skilled Therapeutic Interventions/Progress Updates:   Session 1: Pt received in lying in bed and agreeable to tx. Pt's husband was present for therapy session. Pt performs bed mobility with HOB elevated and CGA to get legs on/off of bed. Pt transfers stand-pivot bed<>w/c with min assist and w/c<>toilet with min assist. Pt voids bladder and performs self-hygiene with supervision. Therapist dons knee high TED hose and tennis shoes total assist. MD performed daily assessment of pt in room during session. Pt wheeled around unit for time management. Pt states feeling "trembly" after being pushed in the w/c and rest break provided. Therapist checks BP in sitting (134/82, HR 72) and in standing (116/74, HR 73). Pt ambulates 100 ft + 100 ft with RW and min assist with manual facilitation of weight shifting to R leg as pt laterally leans trunk to the L. Pt demonstrates decreased R step length, mild ataxia with LLE and steps onto R foot with L foot a few times during gait, requires min assist to regain balance. Pt taps 3" step aiming foot for numbered dot to improve coordination in LLE with NMR and requires min assist to maintain balance with L lateral lean and limited weight acceptance onto RLE. Therapist places mirror in front of pt for visual feedback and performs tapping again with some improvement in weight shifting onto RLE but still requiring min assist for SLS balance. Pt reports fatigue at end of session  and wants to lie down. Therapist gives verbal directional cuing when wheeling pt total assist back to room during turns and stopping, and pt states that it helps with motion sickness symptoms. Pt left lying in bed with bed alarm on and all needs in reach.  No c/o pain during session, but c/o fatigue at end of session.  Session 2: Pt received lying in bed with daughter and son in law present and agreeable to tx. Pt transfers supine<>sit with supervision, and therapist dons shoes total assist. Pt ambulates 5 ft to w/c with HHA and min assist to maintain balance with RLE weight shifting. Pt wheeled around unit for time management. Pt performs reaching activity in standing min assist with horseshoes to correct midline orientation and comfort with RLE weight acceptance. Pt propels w/c 30 ft for LUE NMR and BUE strengthening with supervision and verbal cuing for LUE use and coordinated BUE propulsion. Pt utilizes kinetron in standing for BLE strengthening and weight shifting focusing on terminal knee extension 3 x 10 repetitions and resistance of 30 cm/s with BUE support and CGA>min assist with LLE fatigue. Pt c/o dizziness following last set and orthostatic vitals checked (see below). Pt left lying in bed with bed alarm on and daughter and son in law present to supervise with all needs in reach.  BP in sitting: 118/95, HR 79 BP in standing: 109/79, HR 83 BP in standing 2 min: 118/85, HR 85  Pt c/o "burning" sensation in L forearm following kinetron but subsides with rest.  Therapy Documentation Precautions:  Precautions Precautions: Fall Precaution Comments: bleeding precautions Restrictions Weight Bearing Restrictions: No    Therapy/Group: Individual Therapy  Mary Mercado 06/08/2018, 12:10 PM

## 2018-06-08 NOTE — Progress Notes (Signed)
Occupational Therapy Session Note  Patient Details  Name: Mary Mercado MRN: [id number] Date of Birth: [birth year]  Today's Date: 06/08/2018 OT Individual Time: 8469-6295 OT Individual Time Calculation (min): 79 min   Short Term Goals: Week 1:  OT Short Term Goal 1 (Week 1): Patient will increase functional transfers with ambulation using RW to CS/CG level OT Short Term Goal 2 (Week 1): Patient will increase dressing and bathing to CG/CS seated OT Short Term Goal 3 (Week 1): Patient will increase left hand dexterity to be able to complete shoe tying tasks  OT Short Term Goal 4 (Week 1): Patient will complete light HM tasks with CG A   Skilled Therapeutic Interventions/Progress Updates:    Pt greeted in bed with no c/o pain. Requesting to shower. She completed bathing (at shower level, sit<stand), dressing (sit<stand from toilet) and toileting (using low toilet, with continent bladder void) during session. All functional transfers completed at ambulatory level with HHA on Lt side. Pt initially with 1 small LOB when crossing bathroom threshold, with Mod A to correct. In shower, pt able to incorporate Lt to lather hair with shampoo and wash feet. Also able to use Lt to place hand held shower back into mount. She dropped 4 wash cloths with Lt. Cued pt to increase visual attendance when using limb to compensate for sensation deficits. When dressing, pt reached with L UE for needed clothing items. Able to don shirt without using hemi techniques. Steady assist sit<stand and for standing balance when elevating LB garments over hips. Max A for Teds. She was able to stand for 4 minutes while blow drying and combing hair, integrating L UE throughout to manage dryer. She finished fixing hair while seated. Pt stood again to complete oral hygiene and floss teeth. She stood for 7 minutes with steady assist, using Lt for bilateral tasks and manipulating toothbrush for short windows of time. At end of session she  opted to return to bed. Pt ambulated short distance back to bed and was left with all needs within reach and spouse present. Tx focus today placed on standing balance/endurance, functional transfers, and Lt NMR.       Therapy Documentation Precautions:  Precautions Precautions: Fall Precaution Comments: bleeding precautions Restrictions Weight Bearing Restrictions: No Pain: No c/o pain during session    ADL: ADL Eating: Set up Where Assessed-Eating: Wheelchair Grooming: Minimal assistance Where Assessed-Grooming: Wheelchair Upper Body Bathing: Supervision/safety Where Assessed-Upper Body Bathing: Shower Lower Body Bathing: Minimal assistance Where Assessed-Lower Body Bathing: Shower Upper Body Dressing: Minimal assistance Where Assessed-Upper Body Dressing: Wheelchair Lower Body Dressing: Minimal assistance Where Assessed-Lower Body Dressing: Wheelchair Toileting: Moderate assistance Where Assessed-Toileting: Glass blower/designer: Psychiatric nurse Method: Arts development officer: Energy manager: Environmental education officer Method: Radiographer, therapeutic: Radio broadcast assistant, Grab bars      Therapy/Group: Individual Therapy  Clearence Vitug A Hagop Mccollam 06/08/2018, 12:59 PM

## 2018-06-09 ENCOUNTER — Inpatient Hospital Stay (HOSPITAL_COMMUNITY): Payer: Medicare Other | Admitting: Occupational Therapy

## 2018-06-09 ENCOUNTER — Inpatient Hospital Stay (HOSPITAL_COMMUNITY): Payer: Medicare Other | Admitting: Physical Therapy

## 2018-06-09 NOTE — Progress Notes (Signed)
[facility] PHYSICAL MEDICINE & REHABILITATION PROGRESS NOTE   Subjective/Complaints:  LUE with tingling, better with arm elevated last night  ROS: Patient denies fever, rash, sore throat, blurred vision, nausea, vomiting, diarrhea, cough, shortness of breath or chest pain, joint or back pain, headache, or mood change.    Objective:   No results found. No results for input(s): WBC, HGB, HCT, PLT in the last 72 hours. No results for input(s): NA, K, CL, CO2, GLUCOSE, BUN, CREATININE, CALCIUM in the last 72 hours.  Intake/Output Summary (Last 24 hours) at 06/09/2018 0956 Last data filed at 06/09/2018 0830 Gross per 24 hour  Intake 720 ml  Output -  Net 720 ml     Physical Exam: Vital Signs Blood pressure 129/80, pulse 64, temperature 97.7 F (36.5 C), temperature source Oral, resp. rate 20, height 5\' 5"  (1.651 m), weight 64.2 kg, SpO2 100 %.  Constitutional: No distress . Vital signs reviewed. HEENT: EOMI, oral membranes moist Neck: supple Cardiovascular: RRR without murmur. No JVD    Respiratory: CTA Bilaterally without wheezes or rales. Normal effort    GI: BS +, non-tender, non-distended  Extremities: No clubbing, cyanosis. 1+ LUE edema Skin: substantial bruising LUE  Neurologic: Cranial nerves II through XII intact, motor strength is 5/5 in RIght and 4/5 Left  deltoid, bicep, tricep, grip, hip flexor, knee extensors, ankle dorsiflexor and plantar flexor Sensory exam: reduced  sensation to light touch and proprioception inLeft  upper and lower extremities is ongoing Left neglect on confrontation testing Musculoskeletal: Full range of motion in all 4 extremities    Assessment/Plan: 1. Functional deficits secondary to RIght fronto parietal infarct which require 3+ hours per day of interdisciplinary therapy in a comprehensive inpatient rehab setting.  Physiatrist is providing close team supervision and 24 hour management of active medical problems listed  below.  Physiatrist and rehab team continue to assess barriers to discharge/monitor patient progress toward functional and medical goals  Care Tool:  Bathing    Body parts bathed by patient: Right arm, Left arm, Chest, Abdomen, Front perineal area, Right upper leg, Left upper leg, Right lower leg, Left lower leg, Face, Buttocks   Body parts bathed by helper: Buttocks     Bathing assist Assist Level: Contact Guard/Touching assist     Upper Body Dressing/Undressing Upper body dressing   What is the patient wearing?: Pull over shirt    Upper body assist Assist Level: Supervision/Verbal cueing    Lower Body Dressing/Undressing Lower body dressing      What is the patient wearing?: Pants     Lower body assist Assist for lower body dressing: Minimal Assistance - Patient > 75%     Toileting Toileting    Toileting assist Assist for toileting: Minimal Assistance - Patient > 75%     Transfers Chair/bed transfer  Transfers assist     Chair/bed transfer assist level: Contact Guard/Touching assist     Locomotion Ambulation   Ambulation assist      Assist level: Minimal Assistance - Patient > 75% Assistive device: Hand held assist Max distance: 5 ft   Walk 10 feet activity   Assist     Assist level: Minimal Assistance - Patient > 75% Assistive device: Walker-rolling   Walk 50 feet activity   Assist Walk 50 feet with 2 turns activity did not occur: Safety/medical concerns  Assist level: Minimal Assistance - Patient > 75% Assistive device: Walker-rolling    Walk 150 feet activity   Assist Walk 150 feet activity  did not occur: Safety/medical concerns         Walk 10 feet on uneven surface  activity   Assist Walk 10 feet on uneven surfaces activity did not occur: Safety/medical concerns         Wheelchair     Assist Will patient use wheelchair at discharge?: No Type of Wheelchair: Manual    Wheelchair assist level:  Supervision/Verbal cueing Max wheelchair distance: 30 ft    Wheelchair 50 feet with 2 turns activity    Assist    Wheelchair 50 feet with 2 turns activity did not occur: Safety/medical concerns   Assist Level: Supervision/Verbal cueing   Wheelchair 150 feet activity     Assist Wheelchair 150 feet activity did not occur: Safety/medical concerns   Assist Level: Supervision/Verbal cueing    Medical Problem List and Plan: 1.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 Plavix to begin on 06/06/2018 2. DVT Prophylaxis/Anticoagulation: Pharmaceutical:Lovenox, CBC normal 2/12 3.Headaches/Pain Management:tylenol prn. -tramadol for more severe headache  -elevate LUE, pain improving 4. Mood:LCSW to follow for evaluation and support. 5. Neuropsych: This patientiscapable of making decisions on herown behalf. 6. Skin/Wound Care:routine pressure relief measures.elevation LUE for bruising/edema 7. Fluids/Electrolytes/Nutrition:Monitor I/O. CMET normal except mildly low alb- intake ok will d/c IV 8. HTN: avoid over treatment, but may need to consider medication if BP remains consistently elevated. Monitor for now Vitals:   06/08/18 2017 06/09/18 0503  BP: 132/79 129/80  Pulse: 68 64  Resp: 16 20  Temp: 97.8 F (36.6 C) 97.7 F (36.5 C)  SpO2: 99% 100%  controlled 2/15  9. Left arm hematoma after infiltration of IV TPA- no drop in hgb-monitor Dysesthesia may be related to swelling 10.  Dizziness when walking suspect orthostatic changes,using TEDs LOS: 4 days A FACE TO FACE EVALUATION WAS PERFORMED  Meredith Staggers 06/09/2018, 9:56 AM

## 2018-06-09 NOTE — Progress Notes (Signed)
Occupational Therapy Session Note  Patient Details  Name: Mary Mercado MRN: [id number] Date of Birth: 1940-10-11  Today's Date: 06/09/2018 OT Individual Time: 2263-3354 OT Individual Time Calculation (min): 35 min    Short Term Goals: Week 1:  OT Short Term Goal 1 (Week 1): Patient will increase functional transfers with ambulation using RW to CS/CG level OT Short Term Goal 2 (Week 1): Patient will increase dressing and bathing to CG/CS seated OT Short Term Goal 3 (Week 1): Patient will increase left hand dexterity to be able to complete shoe tying tasks  OT Short Term Goal 4 (Week 1): Patient will complete light HM tasks with CG A   Skilled Therapeutic Interventions/Progress Updates:    Pt received in w/c ready for a shower today.  Due to the short therapy session of 30 min, pt agreed to shower in this session and then wait 15 min with towels covering her until next OT to complete dressing tasks.   Pt was able to  Sit to stand with CGA and ambulate to bathroom with min A.   She used toilet and then doffed clothing with CGA with balance. With doffing jacket, she demonstrated motor impersistence of L hand and attempted grasping fabric several times.  When cued to visually focus on hand pt was able to hold fabric.  Pt stepped into shower and completed shower with close S as she could use grab bars to stand.  Pt did not drop her wash cloth this session demonstrating improved L hand awareness.  Pt dried off well and then ambulated to w/c in room with nonslip socks on.  Pt covered in towels and blankets. Pt's spouse assisting pt with setting up her hairdryer so she could dry hair.  Pt in w/c until next OT to arrive.   Therapy Documentation Precautions:  Precautions Precautions: Fall Precaution Comments: bleeding precautions Restrictions Weight Bearing Restrictions: No    Vital Signs: Therapy Vitals Temp: 97.7 F (36.5 C) Temp Source: Oral Pulse Rate: 64 Resp: 20 BP: 129/80 Patient  Position (if appropriate): Lying Oxygen Therapy SpO2: 100 % O2 Device: Room Air Pain: Pain Assessment Pain Scale: 0-10 Pain Score: 0-No pain   Therapy/Group: Individual Therapy  [place] 06/09/2018, 7:55 AM

## 2018-06-09 NOTE — Progress Notes (Signed)
Occupational Therapy Session Note  Patient Details  Name: Mary Mercado MRN: [id number] Date of Birth: August 13, 1940  Today's Date: 06/09/2018 OT Individual Time: 1000-1045 and 1400-1503 OT Individual Time Calculation (min): 45 min and 63 min   Short Term Goals: Week 1:  OT Short Term Goal 1 (Week 1): Patient will increase functional transfers with ambulation using RW to CS/CG level OT Short Term Goal 2 (Week 1): Patient will increase dressing and bathing to CG/CS seated OT Short Term Goal 3 (Week 1): Patient will increase left hand dexterity to be able to complete shoe tying tasks  OT Short Term Goal 4 (Week 1): Patient will complete light HM tasks with CG A   Skilled Therapeutic Interventions/Progress Updates:    1) Treatment session with focus on ADL retraining with dressing and functional use of LUE.  Pt received upright in w/c with undershirt donned awaiting this therapist to complete remainder of dressing.  Pt with no c/o pain.  Engaged in dressing with therapist providing education for hemi-dressing technique and increased visual attention to LUE to compensate for impaired sensation.  Pt donned TEDS and shoes this session, requiring assistance when tying shoes despite multiple attempts.  Engaged in fine motor control activity in therapy gym with focus on grasp and release as well as motor control when setting up small animal figurines.  Pt demonstrating increased difficulty when attempting to release figurines.  Returned to room and left upright in w/c with husband and visitors present and all needs in reach.  2) Treatment session with focus on functional mobility and functional use of LUE.  Pt received supine in bed with no c/o pain.  Pt ambulated to therapy gym without AD with Min assist/CGA and min cues for gait speed and increased arm swing.  Discussed functional mobility and incorporating head turns and arm swings to increase return to more typical movements pattern.  Engaged in fine  motor control with use of resistive peg board activity.  Pt demonstrating increased success with manipulation of larger pegs (compared to animal figurines), however required min cues to modify hand grasp when challenges arose.  Pt demonstrates difficulty with grasp due to impaired sensation.  Issued pt a simulated shoe to practice tying shoes with pt continuing to demonstrate decreased ability secondary to impaired sensation and sustained grasp.  Ambulated back to room with one instance of scissoring improved when decreased distractions (as pt is easily distracted by others).  Pt transferred to toilet with CGA and completed toileting with CGA.  Returned to bed and left semi-reclined with all needs in reach and husband present.  Therapy Documentation Precautions:  Precautions Precautions: Fall Precaution Comments: bleeding precautions Restrictions Weight Bearing Restrictions: No Pain:  Pt with no c/o pain   Therapy/Group: Individual Therapy  Simonne Come 06/09/2018, 3:19 PM

## 2018-06-09 NOTE — Progress Notes (Signed)
Physical Therapy Session Note  Patient Details  Name: Mary Mercado MRN: [id number] Date of Birth: 07/18/1940  Today's Date: 06/09/2018 PT Individual Time: 1100-1200 PT Individual Time Calculation (min): 60 min   Short Term Goals: Week 1:  PT Short Term Goal 1 (Week 1): Pt will perform transfers with CGA PT Short Term Goal 2 (Week 1): Pt will ambulate 150 ft with RW and min assist PT Short Term Goal 3 (Week 1): Pt will ascend/descend 4 stairs with 2 handrails and min assist  Skilled Therapeutic Interventions/Progress Updates:   Pt in w/c and agreeable to therapy, no c/o pain. Total assist w/c transport to/from therapy gym for time management. Pt denied dizziness throughout session, felt "a little trembly" w/ prolonged standing, vitals WNL as listed below. Ambulated 100' w/ HHA and 100' w/ CGA (no AD). No c/o dizziness, pt stated she felt good walking. Worked on postural control and tolerance positional changes in between gait bouts. Squatted to reach horseshoe from chair and then from 6" step on floor, and performed horseshoe toss. Reached both in R and L directions. CGA for balance tasks. Pt continues to be very stiff w/ all movements, moving trunk and head together as one. Ambulated back to room w/o AD, CGA ~125' w/ 1 seated rest break. No c/o dizziness, pt very encouraged by this. CGA toilet transfer, pt performed pericare and LE garment management w/ CGA and verbal cues for technique. Ended session in w/c, all needs in reach.   Sitting: BP 132/85, 70 bpm After gait: 143/88, 73 bpm  Therapy Documentation Precautions:  Precautions Precautions: Fall Precaution Comments: bleeding precautions Restrictions Weight Bearing Restrictions: No   Therapy/Group: Individual Therapy  Jadore Mcguffin Clent Demark 06/09/2018, 12:04 PM

## 2018-06-10 ENCOUNTER — Inpatient Hospital Stay (HOSPITAL_COMMUNITY): Payer: Medicare Other

## 2018-06-10 NOTE — Progress Notes (Signed)
[facility] PHYSICAL MEDICINE & REHABILITATION PROGRESS NOTE   Subjective/Complaints:  Slept very well last night.  Tingling seems a bit better in left arm.  ROS: Patient denies fever, rash, sore throat, blurred vision, nausea, vomiting, diarrhea, cough, shortness of breath or chest pain, joint or back pain, headache, or mood change.   Objective:   No results found. No results for input(s): WBC, HGB, HCT, PLT in the last 72 hours. No results for input(s): NA, K, CL, CO2, GLUCOSE, BUN, CREATININE, CALCIUM in the last 72 hours.  Intake/Output Summary (Last 24 hours) at 06/10/2018 0855 Last data filed at 06/10/2018 0800 Gross per 24 hour  Intake 840 ml  Output -  Net 840 ml     Physical Exam: Vital Signs Blood pressure (!) 141/84, pulse 61, temperature 97.8 F (36.6 C), resp. rate 18, height 5\' 5"  (1.651 m), weight 64.2 kg, SpO2 100 %.  Constitutional: No distress . Vital signs reviewed. HEENT: EOMI, oral membranes moist Neck: supple Cardiovascular: RRR without murmur. No JVD    Respiratory: CTA Bilaterally without wheezes or rales. Normal effort    GI: BS +, non-tender, non-distended  Extremities: No clubbing, cyanosis. 1+ LUE edema Skin: substantial bruising LUE  Neurologic: Cranial nerves II through XII intact, motor strength is 5/5 in RIght and 4/5 Left  deltoid, bicep, tricep, grip, hip flexor, knee extensors, ankle dorsiflexor and plantar flexor Sensory exam: reduced  sensation to light touch and proprioception inLeft  upper and lower extremities is ongoing Left neglect on confrontation testing Musculoskeletal: Edema left upper extremity stable    Assessment/Plan: 1. Functional deficits secondary to RIght fronto parietal infarct which require 3+ hours per day of interdisciplinary therapy in a comprehensive inpatient rehab setting.  Physiatrist is providing close team supervision and 24 hour management of active medical problems listed below.  Physiatrist and rehab  team continue to assess barriers to discharge/monitor patient progress toward functional and medical goals  Care Tool:  Bathing    Body parts bathed by patient: Right arm, Left arm, Chest, Abdomen, Front perineal area, Right upper leg, Left upper leg, Right lower leg, Left lower leg, Face, Buttocks   Body parts bathed by helper: Buttocks     Bathing assist Assist Level: Supervision/Verbal cueing     Upper Body Dressing/Undressing Upper body dressing   What is the patient wearing?: Pull over shirt    Upper body assist Assist Level: Contact Guard/Touching assist    Lower Body Dressing/Undressing Lower body dressing      What is the patient wearing?: Pants     Lower body assist Assist for lower body dressing: Minimal Assistance - Patient > 75%     Toileting Toileting    Toileting assist Assist for toileting: Contact Guard/Touching assist     Transfers Chair/bed transfer  Transfers assist     Chair/bed transfer assist level: Contact Guard/Touching assist     Locomotion Ambulation   Ambulation assist      Assist level: Contact Guard/Touching assist Assistive device: Other (comment)(none) Max distance: 100'   Walk 10 feet activity   Assist     Assist level: Contact Guard/Touching assist Assistive device: Other (comment)(none)   Walk 50 feet activity   Assist Walk 50 feet with 2 turns activity did not occur: Safety/medical concerns  Assist level: Contact Guard/Touching assist Assistive device: Other (comment)(none)    Walk 150 feet activity   Assist Walk 150 feet activity did not occur: Safety/medical concerns  Walk 10 feet on uneven surface  activity   Assist Walk 10 feet on uneven surfaces activity did not occur: Safety/medical concerns         Wheelchair     Assist Will patient use wheelchair at discharge?: No Type of Wheelchair: Manual    Wheelchair assist level: Supervision/Verbal cueing Max wheelchair  distance: 30 ft    Wheelchair 50 feet with 2 turns activity    Assist    Wheelchair 50 feet with 2 turns activity did not occur: Safety/medical concerns   Assist Level: Supervision/Verbal cueing   Wheelchair 150 feet activity     Assist Wheelchair 150 feet activity did not occur: Safety/medical concerns   Assist Level: Supervision/Verbal cueing    Medical Problem List and Plan: 1.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 Plavix to begin on 06/06/2018 2. DVT Prophylaxis/Anticoagulation: Pharmaceutical:Lovenox, CBC normal 2/12 3.Headaches/Pain Management:tylenol prn. -tramadol for more severe headache  -elevate LUE, pain/swelling improving 4. Mood:LCSW to follow for evaluation and support. 5. Neuropsych: This patientiscapable of making decisions on herown behalf. 6. Skin/Wound Care:routine pressure relief measures.elevation LUE for bruising/edema 7. Fluids/Electrolytes/Nutrition:Monitor I/O. CMET normal except mildly low alb- intake ok will d/c IV 8. HTN: avoid over treatment, but may need to consider medication if BP remains consistently elevated. Monitor for now Vitals:   06/09/18 1948 06/10/18 0409  BP: 114/79 (!) 141/84  Pulse: 69 61  Resp: 18 18  Temp: 98.7 F (37.1 C) 97.8 F (36.6 C)  SpO2: 97% 100%  controlled 2/16  9. Left arm hematoma after infiltration of IV TPA- no drop in hgb-monitor   -Appears stable to slightly improved 10.  Dizziness when walking suspect orthostatic changes,using TEDs   LOS: 5 days A FACE TO FACE EVALUATION WAS PERFORMED  Meredith Staggers 06/10/2018, 8:55 AM

## 2018-06-10 NOTE — Progress Notes (Signed)
Rested good, without complaint of. Large amount of bruising to LUE. Wore SCD's most of night, requested off this AM.Mary Mercado, [name]

## 2018-06-10 NOTE — Progress Notes (Signed)
Physical Therapy Session Note  Patient Details  Name: Mary Mercado MRN: [id number] Date of Birth: March 24, 1941  Today's Date: 06/10/2018 PT Individual Time: 1500-1600 PT Individual Time Calculation (min): 60 min   Short Term Goals: Week 1:  PT Short Term Goal 1 (Week 1): Pt will perform transfers with CGA PT Short Term Goal 2 (Week 1): Pt will ambulate 150 ft with RW and min assist PT Short Term Goal 3 (Week 1): Pt will ascend/descend 4 stairs with 2 handrails and min assist  Skilled Therapeutic Interventions/Progress Updates:    Pt supine in bed upon PT arrival, agreeable to therapy tx and denies pain. Pt transferred to sitting with supervision and transferred to w/c with CGA and transported to the gym. Pt ambulated x 100 ft with RW and CGA working on activity tolerance. Pt worked on dynamic standing balance this session to performed gait without AD x100 ft, backwards ambulation 2 x 45 ft, sidestepping 2 x 40 ft in each direction, all with min assist. Pt worked on dynamic standing balance while standing on airex pad and tossing ball back and forth, min assist for balance. Pt transported back to room to use bathroom, ambulated in/out of bathroom with min assist and no AD. Pt washed hands while maintaining standing balance at the sink. Pt ambulated x 150 ft without AD and min assist, verbal cues for increased step length and gait speed, verbal cues for arm swing. Pt left seated in w/c at end of session with needs in reach and chair alarm set.   Therapy Documentation Precautions:  Precautions Precautions: Fall Precaution Comments: bleeding precautions Restrictions Weight Bearing Restrictions: No   Therapy/Group: Individual Therapy  Netta Corrigan, PT, DPT 06/10/2018, 8:02 AM

## 2018-06-11 ENCOUNTER — Inpatient Hospital Stay (HOSPITAL_COMMUNITY): Payer: Medicare Other | Admitting: Physical Therapy

## 2018-06-11 ENCOUNTER — Inpatient Hospital Stay (HOSPITAL_COMMUNITY): Payer: Medicare Other | Admitting: Occupational Therapy

## 2018-06-11 NOTE — Progress Notes (Signed)
Occupational Therapy Session Note  Patient Details  Name: Mary Mercado MRN: [id number] Date of Birth: 1941-01-21  Today's Date: 06/11/2018 OT Individual Time: 1005-1100 OT Individual Time Calculation (min): 55 min    Short Term Goals: Week 1:  OT Short Term Goal 1 (Week 1): Patient will increase functional transfers with ambulation using RW to CS/CG level OT Short Term Goal 2 (Week 1): Patient will increase dressing and bathing to CG/CS seated OT Short Term Goal 3 (Week 1): Patient will increase left hand dexterity to be able to complete shoe tying tasks  OT Short Term Goal 4 (Week 1): Patient will complete light HM tasks with CG A   Skilled Therapeutic Interventions/Progress Updates:    Pt greeted sitting in wc and agreeable to OT Treatment session, requesting to shower. Pt completes stand-pivot transfers throughout session with supervision. Bathing completed shower level with focus on forced use of L UE for neuro re-ed. Pt able to complete 90% of bathing tasks. UB and LB dressing with supervision and increased time. Educated pt and spouse on use of friction reducing bag to don TED hose with pt demonstrating understanding. Worked on shoe tying activity with focus on pinch and appropriate pressure applied within pinch to pull shoe laces. OT provided built up handle for brush and pt left seated in wc at the sink completing B UE task of hair drying and brushing. Safety belt on and spouse present.   Therapy Documentation Precautions:  Precautions Precautions: Fall Precaution Comments: bleeding precautions Restrictions Weight Bearing Restrictions: No Pain:   none/denies pain  Therapy/Group: Individual Therapy  Valma Cava 06/11/2018, 11:05 AM

## 2018-06-11 NOTE — Progress Notes (Signed)
Physical Therapy Session Note  Patient Details  Name: Mary Mercado MRN: [id number] Date of Birth: 25-May-1940  Today's Date: 06/11/2018 PT Individual Time: 0830-0930 PT Individual Time Calculation (min): 60 min   Short Term Goals: Week 1:  PT Short Term Goal 1 (Week 1): Pt will perform transfers with CGA PT Short Term Goal 2 (Week 1): Pt will ambulate 150 ft with RW and min assist PT Short Term Goal 3 (Week 1): Pt will ascend/descend 4 stairs with 2 handrails and min assist  Skilled Therapeutic Interventions/Progress Updates:    pt performs gait without AD 150' x 2 with min A, minimal head or eye movements during gait due to fear of vertigo.  Vestibular eval performed with pt with increased dizziness with head and eye movements. No nystagmus noted with position changes. Pt given gaze stabilization handouts and pt and husband educated on exercises and vestibular treatments.  Pt performs gait with obstacle negotiation with min A for stepping over and around objects.  Figure 4 stepping for coordination with min A, improving with repetition.  Lt LE strengthening with tap downs fwd/bkwd/laterally with min A. Pt left in room with needs at hand, family present.  Therapy Documentation Precautions:  Precautions Precautions: Fall Precaution Comments: bleeding precautions Restrictions Weight Bearing Restrictions: No Vital Signs: BP 119/76 during session  Pain:  no c/o pain   Therapy/Group: Individual Therapy  [name] 06/11/2018, 9:35 AM

## 2018-06-11 NOTE — Progress Notes (Signed)
[facility] PHYSICAL MEDICINE & REHABILITATION PROGRESS NOTE   Subjective/Complaints:  No pains, asking about Left elbow, husband asking about event monitor  ROS: Patient denies , nausea, vomiting, diarrhea, cough, shortness of breath or chest pain, joint or back pain, headache, or mood change.   Objective:   No results found. No results for input(s): WBC, HGB, HCT, PLT in the last 72 hours. No results for input(s): NA, K, CL, CO2, GLUCOSE, BUN, CREATININE, CALCIUM in the last 72 hours.  Intake/Output Summary (Last 24 hours) at 06/11/2018 0751 Last data filed at 06/10/2018 1700 Gross per 24 hour  Intake 560 ml  Output -  Net 560 ml     Physical Exam: Vital Signs Blood pressure (!) 157/92, pulse 69, temperature 98.3 F (36.8 C), temperature source Oral, resp. rate 18, height 5\' 5"  (1.651 m), weight 64.2 kg, SpO2 100 %.  Constitutional: No distress . Vital signs reviewed. HEENT: EOMI, oral membranes moist Neck: supple Cardiovascular: RRR without murmur. No JVD    Respiratory: CTA Bilaterally without wheezes or rales. Normal effort    GI: BS +, non-tender, non-distended  Extremities: No clubbing, cyanosis. 1+ LUE edema Skin: substantial bruising LUE  Neurologic: Cranial nerves II through XII intact, motor strength is 5/5 in RIght and 4/5 Left  deltoid, bicep, tricep, grip, hip flexor, knee extensors, ankle dorsiflexor and plantar flexor Sensory exam: reduced  sensation to light touch and proprioception inLeft  upper and lower extremities is ongoing Left neglect on confrontation testing Musculoskeletal: Edema left upper extremity stable    Assessment/Plan: 1. Functional deficits secondary to RIght fronto parietal infarct which require 3+ hours per day of interdisciplinary therapy in a comprehensive inpatient rehab setting.  Physiatrist is providing close team supervision and 24 hour management of active medical problems listed below.  Physiatrist and rehab team continue to  assess barriers to discharge/monitor patient progress toward functional and medical goals  Care Tool:  Bathing    Body parts bathed by patient: Right arm, Left arm, Chest, Abdomen, Front perineal area, Right upper leg, Left upper leg, Right lower leg, Left lower leg, Face, Buttocks   Body parts bathed by helper: Buttocks     Bathing assist Assist Level: Supervision/Verbal cueing     Upper Body Dressing/Undressing Upper body dressing   What is the patient wearing?: Pull over shirt    Upper body assist Assist Level: Contact Guard/Touching assist    Lower Body Dressing/Undressing Lower body dressing      What is the patient wearing?: Pants     Lower body assist Assist for lower body dressing: Minimal Assistance - Patient > 75%     Toileting Toileting    Toileting assist Assist for toileting: Contact Guard/Touching assist     Transfers Chair/bed transfer  Transfers assist     Chair/bed transfer assist level: Contact Guard/Touching assist     Locomotion Ambulation   Ambulation assist      Assist level: Contact Guard/Touching assist Assistive device: (none) Max distance: 150 ft   Walk 10 feet activity   Assist     Assist level: Minimal Assistance - Patient > 75% Assistive device: Other (comment)(none)   Walk 50 feet activity   Assist Walk 50 feet with 2 turns activity did not occur: Safety/medical concerns  Assist level: Minimal Assistance - Patient > 75% Assistive device: Other (comment)(none)    Walk 150 feet activity   Assist Walk 150 feet activity did not occur: Safety/medical concerns  Assist level: Minimal Assistance - Patient >  75%      Walk 10 feet on uneven surface  activity   Assist Walk 10 feet on uneven surfaces activity did not occur: Safety/medical concerns         Wheelchair     Assist Will patient use wheelchair at discharge?: No Type of Wheelchair: Manual    Wheelchair assist level: Supervision/Verbal  cueing Max wheelchair distance: 30 ft    Wheelchair 50 feet with 2 turns activity    Assist    Wheelchair 50 feet with 2 turns activity did not occur: Safety/medical concerns   Assist Level: Supervision/Verbal cueing   Wheelchair 150 feet activity     Assist Wheelchair 150 feet activity did not occur: Safety/medical concerns   Assist Level: Supervision/Verbal cueing    Medical Problem List and Plan: 1.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 Plavix to begin on 06/06/2018 2. DVT Prophylaxis/Anticoagulation: Pharmaceutical:Lovenox, CBC normal 2/12 3.Headaches/Pain Management:tylenol prn. -tramadol for more severe headache  -elevate LUE, pain/swelling improving 4. Mood:LCSW to follow for evaluation and support. 5. Neuropsych: This patientiscapable of making decisions on herown behalf. 6. Skin/Wound Care:routine pressure relief measures.elevation LUE for bruising/edema 7. Fluids/Electrolytes/Nutrition:Monitor I/O. CMET normal except mildly low alb- intake ok will d/c IV 8. HTN: avoid over treatment, but may need to consider medication if BP remains consistently elevated. Monitor for now Vitals:   06/10/18 1931 06/11/18 0537  BP: 137/70 (!) 157/92  Pulse: 71 69  Resp: 18 18  Temp: 98.1 F (36.7 C) 98.3 F (36.8 C)  SpO2: 100% 100%  mild elevation 2/17 monitor prior to med changes  9. Left arm hematoma after infiltration of IV TPA- no drop in hgb-monitor   -Appears stable to slightly improved 10.  Dizziness when walking suspect orthostatic vitals negative 2/14  using TEDs 11.  Outpt even monitor planned  LOS: 6 days A FACE TO FACE EVALUATION WAS PERFORMED  Mary Mercado 06/11/2018, 7:51 AM

## 2018-06-11 NOTE — Progress Notes (Signed)
Physical Therapy Session Note  Patient Details  Name: Mary Mercado MRN: [id number] Date of Birth: 11-08-1940  Today's Date: 06/11/2018 PT Concurrent Time: 1312-1427 PT Concurrent Time Calculation (min): 75 min  Short Term Goals: Week 1:  PT Short Term Goal 1 (Week 1): Pt will perform transfers with CGA PT Short Term Goal 2 (Week 1): Pt will ambulate 150 ft with RW and min assist PT Short Term Goal 3 (Week 1): Pt will ascend/descend 4 stairs with 2 handrails and min assist  Skilled Therapeutic Interventions/Progress Updates:  Pt seen for concurrent treatment session. No c/o pain or dizziness during session. Pt transfers to EOB with supervision & bed rails and completes stand pivot to w/c with CGA. In gym pt ambulates 200 ft without AD & CGA with improved overall dynamic balance & endurance. Pt retrieved small beads from red therapy putty with LUE with task focusing on L hand strengthening & NMR. Pt performed L lateral step ups on 6" step with LUE support and CGA<>min assist with task focusing on LLE strengthening & NMR & dynamic balance. Educated pt on impaired proprioception & sensation in LUE/LLE (pt reports LLE is worse than LUE) and potential need to perform skin checks daily 2/2 impairments. Pt reports need to use restroom and in bathroom performs stand pivot with grab bar & CGA and with continent void on toilet, performing peri hygiene and hand hygiene without assistance (w/c set up for hand hygiene at sink). Pt propels w/c with BUE x ~35 ft with supervision and task focusing on LUE forced use for strengthening & NMR. Pt utilizes kinetron in standing with BUE>LUE>no UE support & CGA with task focusing on weigh shifting L<>R, dynamic balance, & LLE strengthening & NMR. Pt ambulates forwards & backwards without AD with min and occasional mod assist with task focusing on dynamic balance & weight shifting. At end of session pt left sitting in w/c in room with chair alrm donned & needs in reach,  husband present in room to supervise.  Pt caught LLE on w/c wheel & therapist inspected it with pt's husband reporting no new injuries & old bruising.   Therapy Documentation Precautions:  Precautions Precautions: Fall Precaution Comments: bleeding precautions Restrictions Weight Bearing Restrictions: No    Therapy/Group: Individual Therapy  Waunita Schooner 06/11/2018, 2:50 PM

## 2018-06-12 ENCOUNTER — Inpatient Hospital Stay (HOSPITAL_COMMUNITY): Payer: Medicare Other | Admitting: Occupational Therapy

## 2018-06-12 ENCOUNTER — Inpatient Hospital Stay (HOSPITAL_COMMUNITY): Payer: Medicare Other

## 2018-06-12 MED ORDER — CLOPIDOGREL BISULFATE 75 MG PO TABS
75.0000 mg | ORAL_TABLET | Freq: Every day | ORAL | Status: DC
  Administered 2018-06-12 – 2018-06-16 (×5): 75 mg via ORAL
  Filled 2018-06-12 (×5): qty 1

## 2018-06-12 NOTE — Progress Notes (Signed)
Physical Therapy Session Note  Patient Details  Name: Mary Mercado MRN: [id number] Date of Birth: [birth year]  Today's Date: 06/12/2018 PT Individual Time: 0915-1015 PT Individual Time Calculation (min): 60 min   Short Term Goals: Week 1:  PT Short Term Goal 1 (Week 1): Pt will perform transfers with CGA PT Short Term Goal 2 (Week 1): Pt will ambulate 150 ft with RW and min assist PT Short Term Goal 3 (Week 1): Pt will ascend/descend 4 stairs with 2 handrails and min assist  Skilled Therapeutic Interventions/Progress Updates:    Pt seated in w/c upon PT arrival, agreeable to therapy tx and denies pain. Pt ambulated into bathroom with supervision and RW, performed clothing management with supervision while maintaining standing balance, continent of bladder this session. Pt ambulated to the sink with RW and supervision, washed hands. Pt ambulated to the gym with RW and supervision x 150 ft. Pt performed gaze stabilization exercises this session x 1 while seated in chair, x 1 while standing and x 2 trials while ambulating with RW, verbal cues for exercise instructions. Pt ambulated 2 x 80 ft this session without AD and min assist working on ambulation with head turns for balance. Pt ascended/descended 4 steps this session with single rail and min assist, cues for techniques. Pt worked on stepping strategy balance reactions this session with induced posterior LOB, announced and at random. Pt, therapist and husband discussed follow up therapy recommendations, discussed the benefits of doing outpatient therapy for this patient instead of home health.  Pt ambulated back to room without AD x 150 ft min assist, left seated in w/c with needs in reach and chair alarm set.   Therapy Documentation Precautions:  Precautions Precautions: Fall Precaution Comments: bleeding precautions Restrictions Weight Bearing Restrictions: No   Therapy/Group: Individual Therapy  Netta Corrigan, PT, DPT 06/12/2018,  7:54 AM

## 2018-06-12 NOTE — Plan of Care (Signed)
  Problem: Consults Goal: RH STROKE PATIENT EDUCATION Description See Patient Education module for education specifics  Outcome: Progressing   Problem: RH SAFETY Goal: RH STG ADHERE TO SAFETY PRECAUTIONS W/ASSISTANCE/DEVICE Description STG Adhere to Safety Precautions With Mod I Assistance/Device.  Outcome: Progressing   Problem: RH PAIN MANAGEMENT Goal: RH STG PAIN MANAGED AT OR BELOW PT'S PAIN GOAL Description <3 out of 10.   Outcome: Progressing

## 2018-06-12 NOTE — Evaluation (Signed)
Recreational Therapy Assessment and Plan  Patient Details  Name: Mary Mercado MRN: [id number] Date of Birth: 03-14-1941 Today's Date: 06/12/2018  Rehab Potential: Good ELOS: 2 weeks   Assessment  Problem List:      Patient Active Problem List   Diagnosis Date Noted  . Palpitations 06/05/2018  . Essential hypertension 06/05/2018  . Hyperlipidemia LDL goal <70 06/05/2018  . Acute ischemic right MCA stroke ([facility]) 06/05/2018  . Stroke (cerebrum) ([facility]) - R MCA s/p tPA, embolic, source unknown 75/91/6384  . SVT (supraventricular tachycardia) ([place]) 05/28/2015  . PVC's (premature ventricular contractions) 05/28/2015  . BENIGN POSITIONAL VERTIGO 06/16/2009  . ALLERGIC RHINITIS 06/16/2009    Past Medical History:      Past Medical History:  Diagnosis Date  . Allergy   . Vertigo, benign positional    Past Surgical History:  Past Surgical History:  Procedure Laterality Date  . Hemilaminectomy and microdiskectomy at L4-5 on the left.  12/10/2003    Assessment & Plan Clinical Impression: Patient is a 78 y.o. year old female with recent admission to the hospital on 05/31/2018 with history of allergies, BPV, palpatationsotherwise in good health. She was admitted with LUE weakness, numbness LUE/LLE and dizziness. UDS negative. CT head negative and she receive tPA. CTA head neck showed intracranial atherosclerosis severe right P2 and mild to moderate B-ICA stenosis and no large vessel occlusion. MRI brain done revealing acute right frontal parietal lobe infarct with small amount of hemorrhage in right parietal infarct and moderate small vessel disease. BLE dopplers were negative for DVT. TEE done revealing EF 60-65% with severely dilated right and left atrium, moderate TV prolapse, plaque in descending aorta, sclerotic AV and no LAA thrombus.  Dizziness and nausea has resolved but patient continues to be limited by left sensory deficits with decreased in coordination and mild  weakness. Therapy ongoing and CIR recommended due to functional decline. Neurology recommends 30 day event monitor due to history of palpitations---if negative will need loop recorder to rule out A fib. Dr. Erlinda Hong felt that stroke was embolic due to unkown source and to continue DAPT X 3 weeks followed by Plavix alone..  Patient transferred to CIR on 06/05/2018 .   Pt presents with decreased activity tolerance, decreased functional mobility, decreased balance, decreased coordination Limiting pt's independence with leisure/community pursuits.  Leisure History/Participation Premorbid leisure interest/current participation: [employer] store;Community - Other (Comment) Leisure Participation Style: With Family/Friends Awareness of Community Resources: Good-identify 3 post discharge leisure resources Psychosocial / Spiritual Social interaction - Mood/Behavior: Cooperative Academic librarian Appropriate for Education?: Yes Patient Agreeable to [employer]?: Yes Recreational Therapy Orientation Orientation -Reviewed with patient: Available activity resources Strengths/Weaknesses Patient Strengths/Abilities: Willingness to participate;Active premorbidly Patient weaknesses: Physical limitations TR Patient demonstrates impairments in the following area(s): Endurance;Motor;Other (comment)(vertigo)  Plan Rec Therapy Plan Is patient appropriate for Therapeutic Recreation?: Yes Rehab Potential: Good Treatment times per week: Min 1 TR session for community reintegration >30 minutes during LOS Estimated Length of Stay: 2 weeks TR Treatment/Interventions: Adaptive equipment instruction;Community reintegration;Balance/vestibular training;Patient/family education;Functional mobility training;Group participation (Comment)  Recommendations for other services: None   Discharge Criteria: Patient will be discharged from TR if patient refuses treatment 3 consecutive times without  medical reason.  If treatment goals not met, if there is a change in medical status, if patient makes no progress towards goals or if patient is discharged from hospital.  The above assessment, treatment plan, treatment alternatives and goals were discussed and mutually agreed upon: by patient  Met with pt this morning to discuss TR services/assessment with emphasis on community reintegration per team referral.  Discussed purpose of an outing and potential goals.  Pt agreeable to participate in an outing to be scheduled tomorrow.  [place] 06/12/2018, 2:41 PM

## 2018-06-12 NOTE — Progress Notes (Signed)
Occupational Therapy Session Note  Patient Details  Name: Mary Mercado MRN: [id number] Date of Birth: [birth year]  Today's Date: 06/12/2018 OT Individual Time: 1015-1130 and 1400-1530 OT Individual Time Calculation (min): 75 min and 90 min   Short Term Goals: Week 1:  OT Short Term Goal 1 (Week 1): Patient will increase functional transfers with ambulation using RW to CS/CG level OT Short Term Goal 2 (Week 1): Patient will increase dressing and bathing to CG/CS seated OT Short Term Goal 3 (Week 1): Patient will increase left hand dexterity to be able to complete shoe tying tasks  OT Short Term Goal 4 (Week 1): Patient will complete light HM tasks with CG A   Skilled Therapeutic Interventions/Progress Updates:    1) Treatment session with focus on ADL retraining and functional use of LUE during self-care tasks.  Pt received upright in w/c reporting desire to bathe at shower level.  Pt with no c/o pain.  Pt ambulated to room shower without AD with CGA.  Pt completed bathing at overall supervision level with close supervision when standing to wash buttocks and perineal area.  Pt ambulated back to room and completed dressing from sit > stand level.  Pt demonstrated increased ability to don TEDS and shoes, even able to fasten shoe on 3rd attempt.  Pt utilized Lt hand during grooming tasks even drying and brushing her hair.  Engaged in [place] in sitting with focus on grasp and release.  Pt demonstrating difficulty with motor control when releasing small items.  Remained seated upright in w/c with family members present to visit and all needs in reach.  2) Treatment session with focus on functional mobility in community setting, dynamic standing balance, and LUE NMR.  Pt received upright seated EOB, reporting no c/o pain.  Discussed bathroom equipment with pt and husband, recommending shower seat.  Pt ambulated to ADL apt with CGA and completed walk-in shower transfers with CGA and cues for hand hold  while stepping over shower ledge.  Engaged in ambulation in hospital gift shop with focus on maneuvering through narrow spaces and around obstacles.  Therapist providing CGA during ambulation.  Engaged in discussion regarding energy conservation and modifications to increase success when returning to leisure activities.  Engaged in [place] in sitting and standing in therapy gym with focus on motor control when reaching.  Incorporated stacking small items, placing items in cup, and stacking cups with focus on gross and fine motor control.  Completed 9 hole peg test with Rt: 31 seconds and Lt: 2:49 (only placing 5 pegs in 2 min time limit).  Ambulated back to room with CGA and transferred to toilet with CGA.  Pt able to complete toileting tasks with close supervision for standing balance.  Returned to w/c and left upright with seat belt alarm on and all needs in reach.  Therapy Documentation Precautions:  Precautions Precautions: Fall Precaution Comments: bleeding precautions Restrictions Weight Bearing Restrictions: No Pain:  Pt with no c/o pain   Therapy/Group: Individual Therapy  Simonne Come 06/12/2018, 12:21 PM

## 2018-06-12 NOTE — Progress Notes (Addendum)
[place] PHYSICAL MEDICINE & REHABILITATION PROGRESS NOTE   Subjective/Complaints:  Discussed anticoagulation, pt bled on LLE yesterday at seborrheic keratosis  ROS: Patient denies , nausea, vomiting, diarrhea, cough, shortness of breath or chest pain, joint or back pain, headache, or mood change.   Objective:   No results found. No results for input(s): WBC, HGB, HCT, PLT in the last 72 hours. No results for input(s): NA, K, CL, CO2, GLUCOSE, BUN, CREATININE, CALCIUM in the last 72 hours.  Intake/Output Summary (Last 24 hours) at 06/12/2018 0737 Last data filed at 06/11/2018 1821 Gross per 24 hour  Intake 360 ml  Output -  Net 360 ml     Physical Exam: Vital Signs Blood pressure (!) 151/76, pulse 67, temperature 97.7 F (36.5 C), temperature source Oral, resp. rate 16, height 5\' 5"  (1.651 m), weight 64.2 kg, SpO2 100 %.  Constitutional: No distress . Vital signs reviewed. HEENT: EOMI, oral membranes moist Neck: supple Cardiovascular: RRR without murmur. No JVD    Respiratory: CTA Bilaterally without wheezes or rales. Normal effort    GI: BS +, non-tender, non-distended  Extremities: No clubbing, cyanosis. 1+ LUE edema Skin: substantial bruising LUE  Neurologic: Cranial nerves II through XII intact, motor strength is 5/5 in RIght and 4/5 Left  deltoid, bicep, tricep, grip, hip flexor, knee extensors, ankle dorsiflexor and plantar flexor Sensory exam: reduced  sensation to light touch and proprioception inLeft  upper and lower extremities, insensate  Left neglect on confrontation testing Musculoskeletal: Edema left upper extremity stable    Assessment/Plan: 1. Functional deficits secondary to RIght fronto parietal infarct which require 3+ hours per day of interdisciplinary therapy in a comprehensive inpatient rehab setting.  Physiatrist is providing close team supervision and 24 hour management of active medical problems listed below.  Physiatrist and rehab team  continue to assess barriers to discharge/monitor patient progress toward functional and medical goals  Care Tool:  Bathing    Body parts bathed by patient: Right arm, Left arm, Chest, Abdomen, Front perineal area, Right upper leg, Left upper leg, Right lower leg, Left lower leg, Face, Buttocks   Body parts bathed by helper: Buttocks     Bathing assist Assist Level: Supervision/Verbal cueing     Upper Body Dressing/Undressing Upper body dressing   What is the patient wearing?: Pull over shirt    Upper body assist Assist Level: Contact Guard/Touching assist    Lower Body Dressing/Undressing Lower body dressing      What is the patient wearing?: Pants     Lower body assist Assist for lower body dressing: Minimal Assistance - Patient > 75%     Toileting Toileting    Toileting assist Assist for toileting: Contact Guard/Touching assist     Transfers Chair/bed transfer  Transfers assist     Chair/bed transfer assist level: Contact Guard/Touching assist     Locomotion Ambulation   Ambulation assist      Assist level: Contact Guard/Touching assist Assistive device: (none) Max distance: 200 ft    Walk 10 feet activity   Assist     Assist level: Contact Guard/Touching assist Assistive device: Other (comment)(none)   Walk 50 feet activity   Assist Walk 50 feet with 2 turns activity did not occur: Safety/medical concerns  Assist level: Contact Guard/Touching assist Assistive device: Other (comment)(none)    Walk 150 feet activity   Assist Walk 150 feet activity did not occur: Safety/medical concerns  Assist level: Contact Guard/Touching assist      Walk 10  feet on uneven surface  activity   Assist Walk 10 feet on uneven surfaces activity did not occur: Safety/medical concerns         Wheelchair     Assist Will patient use wheelchair at discharge?: No Type of Wheelchair: Manual    Wheelchair assist level: Supervision/Verbal  cueing Max wheelchair distance: 35 t(BUE)    Wheelchair 50 feet with 2 turns activity    Assist    Wheelchair 50 feet with 2 turns activity did not occur: Safety/medical concerns   Assist Level: Supervision/Verbal cueing   Wheelchair 150 feet activity     Assist Wheelchair 150 feet activity did not occur: Safety/medical concerns   Assist Level: Supervision/Verbal cueing    Medical Problem List and Plan: 1.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  Probable cardioembolic - Severe sensory def on L -CIR PT, OT, SLP  Team conf in am -ASA for secondary stroke prophylaxis. Plavix to begin on 06/06/2018, ASA to stop 2/25 2. DVT Prophylaxis/Anticoagulation: Pharmaceutical:Lovenox, CBC normal 2/12 3.Headaches/Pain Management:tylenol prn. -tramadol for more severe headache  -elevate LUE, pain/swelling improving 4. Mood:LCSW to follow for evaluation and support. 5. Neuropsych: This patientiscapable of making decisions on herown behalf. 6. Skin/Wound Care:routine pressure relief measures.elevation LUE for bruising/edema 7. Fluids/Electrolytes/Nutrition:Monitor I/O. CMET normal except mildly low alb- intake ok will d/c IV 8. HTN: avoid over treatment, but may need to consider medication if BP remains consistently elevated. Monitor for now Vitals:   06/11/18 2004 06/12/18 0425  BP: 117/76 (!) 151/76  Pulse: 64 67  Resp: 18 16  Temp: 98.2 F (36.8 C) 97.7 F (36.5 C)  SpO2: 99% 100%  mild elevation 2/18 monitor prior to med changes  9. Left arm hematoma after infiltration of IV TPA- no drop in hgb-monitor   -Appears stable to slightly improved 10.  Dizziness when walking suspect orthostatic vitals negative 2/14  using TEDs 11.  Outpt event monitor planned 12.  Hyperlipidemia - Lipitor LOS: 7 days A FACE TO FACE EVALUATION WAS PERFORMED  Mary Mercado 06/12/2018, 7:37 AM

## 2018-06-13 ENCOUNTER — Encounter (HOSPITAL_COMMUNITY): Payer: Medicare Other | Admitting: *Deleted

## 2018-06-13 ENCOUNTER — Inpatient Hospital Stay (HOSPITAL_COMMUNITY): Payer: Medicare Other

## 2018-06-13 ENCOUNTER — Inpatient Hospital Stay (HOSPITAL_COMMUNITY): Payer: Medicare Other | Admitting: Physical Therapy

## 2018-06-13 NOTE — Progress Notes (Signed)
Occupational Therapy Weekly Progress Note  Patient Details  Name: Mary Mercado MRN: [id number] Date of Birth: 1941/03/19  Beginning of progress report period: June 06, 2018 End of progress report period: June 13, 2018  Today's Date: 06/13/2018 OT Individual Time: 0930-1030 OT Individual Time Calculation (min): 60 min    Patient has met 4 of 4 short term goals.  Pt is making steady progress towards goals.  Pt currently completes ambulatory transfers and ambulation with CGA without AD.  Pt has demonstrated ability to complete bathing at sit > stand level with supervision and supervision for standing balance when pulling pants over hips during dressing at sit > stand level.  Pt has demonstrated improved fine motor coordination with ability to fasten shoes with increased time.  Pt continues to require cues to sustain attention to task as she is easily distracted by conversation.    Patient continues to demonstrate the following deficits: muscle weakness, impaired timing and sequencing, unbalanced muscle activation, motor apraxia, decreased coordination, impaired sensation, and decreased standing balance, hemiplegia and decreased balance strategies and therefore will continue to benefit from skilled OT intervention to enhance overall performance with BADL, iADL and Reduce care partner burden.  Patient progressing toward long term goals..  Continue plan of care.  OT Short Term Goals Week 1:  OT Short Term Goal 1 (Week 1): Patient will increase functional transfers with ambulation using RW to CS/CG level OT Short Term Goal 1 - Progress (Week 1): Met OT Short Term Goal 2 (Week 1): Patient will increase dressing and bathing to CG/CS seated OT Short Term Goal 2 - Progress (Week 1): Met OT Short Term Goal 3 (Week 1): Patient will increase left hand dexterity to be able to complete shoe tying tasks  OT Short Term Goal 3 - Progress (Week 1): Met OT Short Term Goal 4 (Week 1): Patient will  complete light HM tasks with CG A  OT Short Term Goal 4 - Progress (Week 1): Met Week 2:  OT Short Term Goal 1 (Week 2): STG = LTGs due to remaining ELOS  Skilled Therapeutic Interventions/Progress Updates:    Treatment session with focus on ADL retraining, dynamic standing balance, and functional use of LUE during self-care tasks.  Pt received upright in recliner expressing desire to shower.  Ambulated to room shower with CGA without AD.  Pt completed bathing at overall sit > stand level with supervision when standing.  Pt demonstrated increased fine and gross motor control during bathing by not dropping wash cloth during bathing this session and demonstrating increased ROM to wash Rt shoulder.  Pt completed dressing at sit > stand level with supervision when standing to pull underwear and pants over hips.  Pt able to fasten shoes this session, however required increased time and attempts compared to yesterday due to easily distracted.  Therapist educated pt and husband on energy conservation strategies and modifying routine to increase success and timeliness (picking out clothing night before, sitting to complete prolonged self-care tasks, etc) when completing self-care tasks.  Pt dried hair in sitting with use of LUE when combing hair and even maintaining hair dryer.  Discussed set up of bathroom to allow for pt to complete tasks at sit > stand level depending on endurance.  Pt remained seated at sink to complete grooming tasks, with husband providing setup assist as needed.  Therapy Documentation Precautions:  Precautions Precautions: Fall Precaution Comments: bleeding precautions Restrictions Weight Bearing Restrictions: No General:   Vital Signs:   Pain: Pain  Assessment Pain Scale: 0-10 Pain Score: 0-No pain ADL: ADL Eating: Set up Where Assessed-Eating: Wheelchair Grooming: Minimal assistance Where Assessed-Grooming: Wheelchair Upper Body Bathing: Supervision/safety Where  Assessed-Upper Body Bathing: Shower Lower Body Bathing: Minimal assistance Where Assessed-Lower Body Bathing: Shower Upper Body Dressing: Minimal assistance Where Assessed-Upper Body Dressing: Wheelchair Lower Body Dressing: Minimal assistance Where Assessed-Lower Body Dressing: Wheelchair Toileting: Moderate assistance Where Assessed-Toileting: Glass blower/designer: Psychiatric nurse Method: Arts development officer: Energy manager: Environmental education officer Method: Radiographer, therapeutic: Radio broadcast assistant, Systems analyst    Praxis   Exercises:   Other Treatments:     Therapy/Group: Individual Therapy  Simonne Come 06/13/2018, 10:13 AM

## 2018-06-13 NOTE — Patient Care Conference (Signed)
Inpatient RehabilitationTeam Conference and Plan of Care Update Date: 06/13/2018   Time: 10:50 am    Patient Name: Mary Mercado      Medical Record Number: [id number]  Date of Birth: 04/09/1941 Sex: Female         Room/Bed: 4W17C/4W17C-01 Payor Info: Payor: MEDICARE / Plan: MEDICARE PART A AND B / Product Type: *No Product type* /    Admitting Diagnosis: Debility  Admit Date/Time:  06/05/2018  2:17 PM Admission Comments: No comment available   Primary Diagnosis:  <principal problem not specified> Principal Problem: <principal problem not specified>  Patient Active Problem List   Diagnosis Date Noted  . Palpitations 06/05/2018  . Essential hypertension 06/05/2018  . Hyperlipidemia LDL goal <70 06/05/2018  . Acute ischemic right MCA stroke ([place]) 06/05/2018  . Stroke (cerebrum) ([place]) - R MCA s/p tPA, embolic, source unknown 29/51/8841  . SVT (supraventricular tachycardia) ([place]) 05/28/2015  . PVC's (premature ventricular contractions) 05/28/2015  . BENIGN POSITIONAL VERTIGO 06/16/2009  . ALLERGIC RHINITIS 06/16/2009    Expected Discharge Date: Expected Discharge Date: 06/16/18  Team Members Present: Physician leading conference: Dr. [name] Social Worker Present: Ovidio Kin, LCSW Nurse Present: Rayetta Pigg, RN PT Present: Lavone Nian, PT OT Present: Simonne Come, OT SLP Present: Charolett Bumpers, SLP PPS Coordinator present : Gunnar Fusi     Current Status/Progress Goal Weekly Team Focus  Medical   Left hemiparesis left neglect left hemisensory deficits some improvement,  Due to fall risk, maintain medical stability reduce secondary stroke risk  Increase left-sided awareness   Bowel/Bladder        cont of B & B     Swallow/Nutrition/ Hydration             ADL's   supervision bathing and dressing, CGA transfers without AD  supervision bathing, shower transfer, and IADLs, Mod I dressing and toilet transfer  ADL retraining, dynamic standing balance,  home making tasks, LUE NMR   Mobility   close supervision gait without AD, 39/56 on Berg Balance Test, supervision transfers, supervision floor transfer  downgraded to supervision overall due to no AD  transfers, gait, stair negotiation, balance, endurance, L NMR, pt/family education, d/c planning   Communication             Safety/Cognition/ Behavioral Observations            Pain        no issues     Skin        monitor no skin issues        *See Care Plan and progress notes for long and short-term goals.     Barriers to Discharge  Current Status/Progress Possible Resolutions Date Resolved   Physician    Medical stability     Continue rehabilitation  See above      Nursing                  PT                    OT                  SLP                SW                Discharge Planning/Teaching Needs:  Home with husband who can provide supervision level and trasnport to OP center. Pt is making good progress and is ready to go  home by the end of the week.      Team Discussion:  Progressing toward goals fof supervision level. L-UE & LE strengthening and sensation is better. Good awareness of deficits. Medically stable for DC Sat. Husband here daily and participates in her therapies.   Revisions to Treatment Plan:  DC 2/22    Continued Need for Acute Rehabilitation Level of Care: The patient requires daily medical management by a physician with specialized training in physical medicine and rehabilitation for the following conditions: Daily direction of a multidisciplinary physical rehabilitation program to ensure safe treatment while eliciting the highest outcome that is of practical value to the patient.: Yes Daily medical management of patient stability for increased activity during participation in an intensive rehabilitation regime.: Yes Daily analysis of laboratory values and/or radiology reports with any subsequent need for medication adjustment of medical  intervention for : Neurological problems   I attest that I was present, lead the team conference, and concur with the assessment and plan of the team.   Elease Hashimoto 06/14/2018, 8:40 AM

## 2018-06-13 NOTE — Progress Notes (Signed)
Recreational Therapy Discharge Summary Patient Details  Name: Mary Mercado MRN: [id number] Date of Birth: 1940/07/07 Today's Date: 06/13/2018  Long term goals set: 1  Long term goals met: 1  Comments on progress toward goals: Pt has made excellent progress during LOS and is scheduled for discharge home with husband to provide supervision.  TR session focused on community reintegration & education ambulatory level.  Pt met min assist/contact guard assist for community mobility.  Goal met. Reasons for discharge: discharge from hospital Patient/family agrees with progress made and goals achieved: Yes  [name] 06/13/2018, 3:53 PM

## 2018-06-13 NOTE — Progress Notes (Addendum)
Physical Therapy Weekly Progress Note  Patient Details  Name: Mary Mercado MRN: [id number] Date of Birth: 1940-07-24  Beginning of progress report period: June 06, 2018 End of progress report period: June 13, 2018  Today's Date: 06/13/2018 PT Individual Time: 1107-1203 PT Individual Time Calculation (min): 56 min   Patient has met 3 of 3 short term goals.  Pt is making good progress towards independence with functional mobility. She is currently ambulating without AD & CGA and has been educated on gaze stabilization exercises to reduce feelings of "dizzines" that existed prior to medical event leading to admission. It is anticipated that pt will d/c at an ambulatory level without AD therefore LTG's are now supervision. Pt would benefit from continued skilled PT treatment to focus on transfers, gait, balance, L NMR, and pt/family education prior to d/c.  Patient continues to demonstrate the following deficits muscle weakness, decreased cardiorespiratoy endurance, decreased coordination, central origin and decreased standing balance, decreased postural control and decreased balance strategies and therefore will continue to benefit from skilled PT intervention to increase functional independence with mobility.  Patient progressing toward long term goals..  Plan of care revisions: goals downgraded to supervision due to impaired balance, and potential for pt to ambulate without AD at d/c.  PT Short Term Goals Week 1:  PT Short Term Goal 1 (Week 1): Pt will perform transfers with CGA PT Short Term Goal 1 - Progress (Week 1): Met PT Short Term Goal 2 (Week 1): Pt will ambulate 150 ft with RW and min assist PT Short Term Goal 2 - Progress (Week 1): Met PT Short Term Goal 3 (Week 1): Pt will ascend/descend 4 stairs with 2 handrails and min assist PT Short Term Goal 3 - Progress (Week 1): Met Week 2:  PT Short Term Goal 1 (Week 2): STG = LTG due to estimated d/c date.  Skilled Therapeutic  Interventions/Progress Updates:  Pt received in room with husband present, pt agreeable to tx & no c/o pain reported. Educated pt & husband on d/c date & situations when she should call EMS if she experiences a fall at home. Pt ambulates in room & bathroom without AD & CGA, performs toilet transfer with close supervision with continent void. Pt performed hand hygiene standing at sink with close supervision. Pt ambulates throughout unit without AD & close supervision with cuing for LUE reciprocal arm swing. Pt completes Berg Balance Test & scored [phone]; educated pt on interpretation of score & current fall risk. Patient demonstrates increased fall risk as noted by score of 39/56 on Berg Balance Scale.  (<36= high risk for falls, close to 100%; 37-45 significant >80%; 46-51 moderate >50%; 52-55 lower >25%). Re-educated pt & husband on instances when she should call EMS if she experiences a fall at home. Pt completes floor transfer x 2 with supervision overall. Pt completes bed mobility & transfer from rocking recliner (pt reports having a wooden recliner at home) with supervision overall. Discussed safety in the community and ability to hold husband's hand for increased stability in community - educated them on need for pt's husband to stand on her left side. At end of session pt left sitting in recliner with chair alarm donned, call bell in reach, husband in room & set up with meal tray.   Addendum: Educated pt & husband on need to ensure clear walkways & remove tripping hazards/remove throw rugs in home.   Therapy Documentation Precautions:  Precautions Precautions: Fall Precaution Comments: bleeding precautions Restrictions Weight Bearing  Restrictions: No   Balance: Balance Balance Assessed: Yes Standardized Balance Assessment Standardized Balance Assessment: Berg Balance Test Berg Balance Test Sit to Stand: Able to stand without using hands and stabilize independently Standing Unsupported:  Able to stand 2 minutes with supervision Sitting with Back Unsupported but Feet Supported on Floor or Stool: Able to sit safely and securely 2 minutes Stand to Sit: Sits safely with minimal use of hands Transfers: Able to transfer safely, minor use of hands Standing Unsupported with Eyes Closed: Able to stand 10 seconds with supervision Standing Ubsupported with Feet Together: Able to place feet together independently and stand for 1 minute with supervision From Standing, Reach Forward with Outstretched Arm: Can reach forward >12 cm safely (5") From Standing Position, Pick up Object from Floor: Able to pick up shoe, needs supervision From Standing Position, Turn to Look Behind Over each Shoulder: Needs supervision when turning Turn 360 Degrees: Needs close supervision or verbal cueing Standing Unsupported, Alternately Place Feet on Step/Stool: Able to complete 4 steps without aid or supervision(completes 8 steps but with close supervision) Standing Unsupported, One Foot in Front: Able to plae foot ahead of the other independently and hold 30 seconds Standing on One Leg: Tries to lift leg/unable to hold 3 seconds but remains standing independently Total Score: 39   Therapy/Group: Individual Therapy  Waunita Schooner 06/13/2018, 12:10 PM

## 2018-06-13 NOTE — Progress Notes (Signed)
Recreational Therapy Session Note  Patient Details  Name: Mary Mercado MRN: [id number] Date of Birth: 02/09/1941 Today's Date: 06/13/2018 Time: 1300-1430  Pain: no c/o  Skilled Therapeutic Interventions/Progress Updates: Pt participated in community reintegration/outing to [facility] at overall contact guard assist ambulatory level.  Goals focused on safe community mobility, identification & negotiation of obstacles, accessing public restroom, energy conservation techniques/education.  See outing goal sheet in shadow chart for full details.  Goals met.   Therapy/Group: Community Reintegration  SIMPSON,LISA 06/13/2018, 3:49 PM  

## 2018-06-13 NOTE — Plan of Care (Signed)
  Problem: Consults Goal: RH STROKE PATIENT EDUCATION Description See Patient Education module for education specifics  Outcome: Progressing   Problem: RH SAFETY Goal: RH STG ADHERE TO SAFETY PRECAUTIONS W/ASSISTANCE/DEVICE Description STG Adhere to Safety Precautions With Mod I Assistance/Device.  Outcome: Progressing   Problem: RH PAIN MANAGEMENT Goal: RH STG PAIN MANAGED AT OR BELOW PT'S PAIN GOAL Description <3 out of 10.   Outcome: Progressing

## 2018-06-13 NOTE — Progress Notes (Signed)
Occupational Therapy Session Note  Patient Details  Name: Mary Mercado MRN: [id number] Date of Birth: 1940/05/01  Today's Date: 06/13/2018 OT Individual Time: 1300-1430 OT Individual Time Calculation (min): 90 min    Short Term Goals: Week 2:  OT Short Term Goal 1 (Week 2): STG = LTGs due to remaining ELOS  Skilled Therapeutic Interventions/Progress Updates:    Pt seen for community reintegration/outing with TR.  Pt ambulated in community setting, on uneven surfaces and up/down van steps with CGA without AD.  Educated on recommendation for close supervision/CGA from husband in community.  Pt utilized LUE to obtain various items as well as reaching outside BOS to obtain items from various height shelves.  Pt demonstrated good safety awareness and ability to identify when needing to take a seated rest break. Discussed energy conservation strategies and problem solving obstacles with community reintegration.  Pt pleased with experience and reports "eye opening" in regards to returning to community.   Therapy Documentation Precautions:  Precautions Precautions: Fall Precaution Comments: bleeding precautions Restrictions Weight Bearing Restrictions: No General:   Vital Signs: Therapy Vitals Temp: 97.9 F (36.6 C) Temp Source: Oral Pulse Rate: 64 Resp: 18 BP: (!) 143/70 Patient Position (if appropriate): Sitting Oxygen Therapy SpO2: 98 % O2 Device: Room Air Pain:  Pt with no c/o pain   Therapy/Group: Individual Therapy  Simonne Come 06/13/2018, 4:11 PM

## 2018-06-13 NOTE — Progress Notes (Signed)
Social Work Patient ID: Mary Mercado, female   DOB: Feb 23, 1941, 78 y.o.   MRN: [id number] Met with pt and husband to inform team conference goals reaching of supervision-mod/i and discharge 2/22. She is so pleased with how well she has done here. Agreeable to OP therapies and have made referral at [facility] will contact husband to set up appointments. Husband to get tub seat no other equipment needs. Work toward discharge Sat.

## 2018-06-13 NOTE — Progress Notes (Signed)
[place] PHYSICAL MEDICINE & REHABILITATION PROGRESS NOTE   Subjective/Complaints:  Pt asking about event monitor  ROS: Patient denies , nausea, vomiting, diarrhea, cough, shortness of breath or chest pain, joint or back pain, headache, or mood change.   Objective:   No results found. No results for input(s): WBC, HGB, HCT, PLT in the last 72 hours. No results for input(s): NA, K, CL, CO2, GLUCOSE, BUN, CREATININE, CALCIUM in the last 72 hours.  Intake/Output Summary (Last 24 hours) at 06/13/2018 0755 Last data filed at 06/12/2018 1300 Gross per 24 hour  Intake 480 ml  Output -  Net 480 ml     Physical Exam: Vital Signs Blood pressure 136/79, pulse 68, temperature 98.1 F (36.7 C), temperature source Oral, resp. rate 16, height 5' 5" (1.651 m), weight 64.2 kg, SpO2 97 %.  Constitutional: No distress . Vital signs reviewed. HEENT: EOMI, oral membranes moist Neck: supple Cardiovascular: RRR without murmur. No JVD    Respiratory: CTA Bilaterally without wheezes or rales. Normal effort    GI: BS +, non-tender, non-distended  Extremities: No clubbing, cyanosis. 1+ LUE edema Skin: substantial bruising LUE  Neurologic: Cranial nerves II through XII intact, motor strength is 5/5 in RIght and 4/5 Left  deltoid, bicep, tricep, grip, hip flexor, knee extensors, ankle dorsiflexor and plantar flexor Sensory exam: reduced  sensation to light touch and proprioception inLeft  upper and lower extremities, insensate  Left neglect on confrontation testing Musculoskeletal: Edema left upper extremity stable    Assessment/Plan: 1. Functional deficits secondary to RIght fronto parietal infarct which require 3+ hours per day of interdisciplinary therapy in a comprehensive inpatient rehab setting.  Physiatrist is providing close team supervision and 24 hour management of active medical problems listed below.  Physiatrist and rehab team continue to assess barriers to discharge/monitor patient  progress toward functional and medical goals  Care Tool:  Bathing    Body parts bathed by patient: Right arm, Left arm, Chest, Abdomen, Front perineal area, Right upper leg, Left upper leg, Right lower leg, Left lower leg, Face, Buttocks   Body parts bathed by helper: Buttocks     Bathing assist Assist Level: Supervision/Verbal cueing     Upper Body Dressing/Undressing Upper body dressing   What is the patient wearing?: Pull over shirt    Upper body assist Assist Level: Supervision/Verbal cueing    Lower Body Dressing/Undressing Lower body dressing      What is the patient wearing?: Underwear/pull up, Pants     Lower body assist Assist for lower body dressing: Supervision/Verbal cueing     Toileting Toileting    Toileting assist Assist for toileting: Supervision/Verbal cueing     Transfers Chair/bed transfer  Transfers assist     Chair/bed transfer assist level: Contact Guard/Touching assist     Locomotion Ambulation   Ambulation assist      Assist level: Contact Guard/Touching assist Assistive device: (no device) Max distance: 200 ft    Walk 10 feet activity   Assist     Assist level: Contact Guard/Touching assist Assistive device: Other (comment)(none)   Walk 50 feet activity   Assist Walk 50 feet with 2 turns activity did not occur: Safety/medical concerns  Assist level: Contact Guard/Touching assist Assistive device: Other (comment)(none)    Walk 150 feet activity   Assist Walk 150 feet activity did not occur: Safety/medical concerns  Assist level: Contact Guard/Touching assist      Walk 10 feet on uneven surface  activity   Assist  Walk 10 feet on uneven surfaces activity did not occur: Safety/medical concerns         Wheelchair     Assist Will patient use wheelchair at discharge?: No Type of Wheelchair: Manual    Wheelchair assist level: Supervision/Verbal cueing Max wheelchair distance: 35 t(BUE)     Wheelchair 50 feet with 2 turns activity    Assist    Wheelchair 50 feet with 2 turns activity did not occur: Safety/medical concerns   Assist Level: Supervision/Verbal cueing   Wheelchair 150 feet activity     Assist Wheelchair 150 feet activity did not occur: Safety/medical concerns   Assist Level: Supervision/Verbal cueing    Medical Problem List and Plan: 1.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  Probable cardioembolic - Team conference today please see physician documentation under team conference tab, met with team face-to-face to discuss problems,progress, and goals. Formulized individual treatment plan based on medical history, underlying problem and comorbidities. -ASA for secondary stroke prophylaxis. Plavix to begin on 06/06/2018, ASA to stop 2/25 2. DVT Prophylaxis/Anticoagulation: Pharmaceutical:Lovenox, CBC normal 2/12 3.Headaches/Pain Management:tylenol prn. -tramadol for more severe headache  -elevate LUE, pain/swelling improving 4. Mood:LCSW to follow for evaluation and support. 5. Neuropsych: This patientiscapable of making decisions on herown behalf. 6. Skin/Wound Care:routine pressure relief measures.elevation LUE for bruising/edema 7. Fluids/Electrolytes/Nutrition:Monitor I/O. CMET normal except mildly low alb- intake ok will d/c IV 8. HTN: avoid over treatment, but may need to consider medication if BP remains consistently elevated. Monitor for now Vitals:   06/12/18 1957 06/13/18 0326  BP: 135/80 136/79  Pulse: 68 68  Resp: 18 16  Temp: 97.7 F (36.5 C) 98.1 F (36.7 C)  SpO2: 99% 97%  controlled 2/19  9. Left arm hematoma after infiltration of IV TPA- no drop in hgb-monitor   -Appears stable to slightly improved 10.  Dizziness when walking suspect orthostatic vitals negative 2/14  using TEDs 11.  Outpt event monitor planned 12.  Hyperlipidemia -  Lipitor LOS: 8 days A FACE TO FACE EVALUATION WAS PERFORMED  Charlett Blake 06/13/2018, 7:55 AM

## 2018-06-14 ENCOUNTER — Inpatient Hospital Stay (HOSPITAL_COMMUNITY): Payer: Medicare Other

## 2018-06-14 ENCOUNTER — Inpatient Hospital Stay (HOSPITAL_COMMUNITY): Payer: Medicare Other | Admitting: Physical Therapy

## 2018-06-14 ENCOUNTER — Inpatient Hospital Stay (HOSPITAL_COMMUNITY): Payer: Medicare Other | Admitting: Occupational Therapy

## 2018-06-14 NOTE — Plan of Care (Signed)
  Problem: Consults Goal: RH STROKE PATIENT EDUCATION Description See Patient Education module for education specifics  Outcome: Progressing   Problem: RH SAFETY Goal: RH STG ADHERE TO SAFETY PRECAUTIONS W/ASSISTANCE/DEVICE Description STG Adhere to Safety Precautions With Mod I Assistance/Device.  Outcome: Progressing   Problem: RH PAIN MANAGEMENT Goal: RH STG PAIN MANAGED AT OR BELOW PT'S PAIN GOAL Description <3 out of 10.   Outcome: Progressing

## 2018-06-14 NOTE — Progress Notes (Signed)
Occupational Therapy Session Note  Patient Details  Name: Jaquetta Currier MRN: [id number] Date of Birth: Jan 07, 1941  Today's Date: 06/14/2018 OT Individual Time: 0700-0725 OT Individual Time Calculation (min): 25 min    Short Term Goals: Week 1:  OT Short Term Goal 1 (Week 1): Patient will increase functional transfers with ambulation using RW to CS/CG level OT Short Term Goal 1 - Progress (Week 1): Met OT Short Term Goal 2 (Week 1): Patient will increase dressing and bathing to CG/CS seated OT Short Term Goal 2 - Progress (Week 1): Met OT Short Term Goal 3 (Week 1): Patient will increase left hand dexterity to be able to complete shoe tying tasks  OT Short Term Goal 3 - Progress (Week 1): Met OT Short Term Goal 4 (Week 1): Patient will complete light HM tasks with CG A  OT Short Term Goal 4 - Progress (Week 1): Met  Skilled Therapeutic Interventions/Progress Updates:    1:1. Pt completes standing grooming oral care, face washing, and hair care with S. Pt walks with on AD and CGA for imporved funcitonal endruance to dayroom. Pt stands until fatigued at high low table to cut and tie fabric strips of no sew blanket to work on BUE coordination and dexterity. Pt requires VC for using tip to tip pinch to maintain grasp on fabric with LUE while cutting fabric as supporter to RUE. Exited session with pt seated EOB with call light in reach, exit alarm on and all needs met  Therapy Documentation Precautions:  Precautions Precautions: Fall Precaution Comments: bleeding precautions Restrictions Weight Bearing Restrictions: No General:   Vital Signs: Therapy Vitals Temp: 98.5 F (36.9 C) Temp Source: Oral Pulse Rate: 78 Resp: 18 BP: 113/79 Patient Position (if appropriate): Sitting Oxygen Therapy SpO2: 99 % O2 Device: Room Air Pain:     Therapy/Group: Individual Therapy  Tonny Branch 06/14/2018, 7:18 AM

## 2018-06-14 NOTE — Progress Notes (Signed)
Physical Therapy Session Note  Patient Details  Name: Antavia Tandy MRN: [id number] Date of Birth: 1941/01/19  Today's Date: 06/14/2018 PT Individual Time: 1405-1500 PT Individual Time Calculation (min): 55 min   Short Term Goals: Week 2:  PT Short Term Goal 1 (Week 2): STG = LTG due to estimated d/c date.  Skilled Therapeutic Interventions/Progress Updates:   Pt in recliner and agreeable to therapy, no c/o pain. Session focused on community and household ambulation w/o AD. Ambulated around hospital in busy and distracting environment. Worked on dual tasking while naming foods A to Z. Slight decrease in speed w/ cognitive demand, no decrease in safety. Ambulated >1000' in total. Household ambulation while negotiating cones, stepping over objects, and negotiating tight spaces. Close supervision for all gait, no LOB and intermittent seated rest breaks 2/2 fatigue. Returned to room and performed toilet transfer and hand hygiene w/ supervision. Ended session in recliner, all needs in reach.   Therapy Documentation Precautions:  Precautions Precautions: Fall Precaution Comments: bleeding precautions Restrictions Weight Bearing Restrictions: No Vital Signs: Therapy Vitals Temp: (!) 97.5 F (36.4 C) Pulse Rate: 79 Resp: 18 BP: 121/83 Patient Position (if appropriate): Sitting Oxygen Therapy SpO2: 100 % O2 Device: Room Air Pain: Pain Assessment Pain Score: 0-No pain  Therapy/Group: Individual Therapy  Sartaj Hoskin Clent Demark 06/14/2018, 2:58 PM

## 2018-06-14 NOTE — Progress Notes (Signed)
Occupational Therapy Session Note  Patient Details  Name: Mary Mercado MRN: [id number] Date of Birth: 1941-01-28  Today's Date: 06/14/2018 OT Individual Time: 1100-1200 OT Individual Time Calculation (min): 60 min    Short Term Goals: Week 1:  OT Short Term Goal 1 (Week 1): Patient will increase functional transfers with ambulation using RW to CS/CG level OT Short Term Goal 1 - Progress (Week 1): Met OT Short Term Goal 2 (Week 1): Patient will increase dressing and bathing to CG/CS seated OT Short Term Goal 2 - Progress (Week 1): Met OT Short Term Goal 3 (Week 1): Patient will increase left hand dexterity to be able to complete shoe tying tasks  OT Short Term Goal 3 - Progress (Week 1): Met OT Short Term Goal 4 (Week 1): Patient will complete light HM tasks with CG A  OT Short Term Goal 4 - Progress (Week 1): Met Week 2:  OT Short Term Goal 1 (Week 2): STG = LTGs due to remaining ELOS     Skilled Therapeutic Interventions/Progress Updates:    Pt seen for ADL training to include toileting, shower, dressing and grooming at sink including blow drying her hair with R hand and using brush to curl hair with L hand.  Overall pt only needed S with all tasks.  At the end of the session, pt was distracted talking with her husband and was having difficulty with tying her shoes.  Due to time, assisted pt..  Pt used her L hand actively the entire session and did not drop any items.   Discussed upcoming discharge and plans.  Pt resting in w/c with with belt alarm on and all needs met.  Therapy Documentation Precautions:  Precautions Precautions: Fall Precaution Comments: bleeding precautions Restrictions Weight Bearing Restrictions: No   Pain: Pain Assessment Pain Score: 0-No pain ADL:  Therapy/Group: Individual Therapy  [place] 06/14/2018, 1:05 PM

## 2018-06-14 NOTE — Progress Notes (Signed)
Occupational Therapy Session Note  Patient Details  Name: Natesha Hassey MRN: [id number] Date of Birth: 1941/02/09  Today's Date: 06/14/2018 OT Individual Time: 1300-1400 OT Individual Time Calculation (min): 60 min    Short Term Goals: Week 2:  OT Short Term Goal 1 (Week 2): STG = LTGs due to remaining ELOS  Skilled Therapeutic Interventions/Progress Updates:    OT intervention with focus on functional amb without AD, toileting tasks, simple kitchen tasks, simple home mgmt tasks, and laundry tasks to increase independence with BADLs. Pt amb without AD to ADL apartment and prepared a fried egg while engaging in conversation with therapist.  Pt performed task without assistance and with no unsafe behaviors.  Pt also gathered towels from room and carried in laundry basket to gym to place in soiled linen bag.  Pt engaged in simple home mgmt tasks in apartment before returning to room.  Pt remained in recliner with all needs within reach, belt alarm activated, and NT present.  Pt required occasional CGA when ambulating.   Therapy Documentation Precautions:  Precautions Precautions: Fall Precaution Comments: bleeding precautions Restrictions Weight Bearing Restrictions: No   Pain: Pain Assessment Pain Score: 0-No pain   Therapy/Group: Individual Therapy  Leroy Libman 06/14/2018, 2:56 PM

## 2018-06-14 NOTE — Progress Notes (Signed)
[place] PHYSICAL MEDICINE & REHABILITATION PROGRESS NOTE   Subjective/Complaints:  No issues overnite, starting to sense cold with LUE  ROS: Patient denies , nausea, vomiting, diarrhea, cough, shortness of breath or chest pain, joint or back pain, headache, or mood change.   Objective:   No results found. No results for input(s): WBC, HGB, HCT, PLT in the last 72 hours. No results for input(s): NA, K, CL, CO2, GLUCOSE, BUN, CREATININE, CALCIUM in the last 72 hours.  Intake/Output Summary (Last 24 hours) at 06/14/2018 0844 Last data filed at 06/13/2018 1801 Gross per 24 hour  Intake 720 ml  Output -  Net 720 ml     Physical Exam: Vital Signs Blood pressure 113/79, pulse 78, temperature 98.5 F (36.9 C), temperature source Oral, resp. rate 18, height 5\' 5"  (1.651 m), weight 64.2 kg, SpO2 99 %.  Constitutional: No distress . Vital signs reviewed. HEENT: EOMI, oral membranes moist Neck: supple Cardiovascular: RRR without murmur. No JVD    Respiratory: CTA Bilaterally without wheezes or rales. Normal effort    GI: BS +, non-tender, non-distended  Extremities: No clubbing, cyanosis. 1+ LUE edema Skin: substantial bruising LUE  Neurologic: Cranial nerves II through XII intact, motor strength is 5/5 in RIght and 4/5 Left  deltoid, bicep, tricep, grip, hip flexor, knee extensors, ankle dorsiflexor and plantar flexor Sensory exam: reduced  sensation to light touch and proprioception inLeft  upper and lower extremities, insensate  Left neglect on confrontation testing Musculoskeletal: Edema left upper extremity stable    Assessment/Plan: 1. Functional deficits secondary to RIght fronto parietal infarct which require 3+ hours per day of interdisciplinary therapy in a comprehensive inpatient rehab setting.  Physiatrist is providing close team supervision and 24 hour management of active medical problems listed below.  Physiatrist and rehab team continue to assess barriers to  discharge/monitor patient progress toward functional and medical goals  Care Tool:  Bathing    Body parts bathed by patient: Right arm, Left arm, Chest, Abdomen, Front perineal area, Right upper leg, Left upper leg, Right lower leg, Left lower leg, Face, Buttocks   Body parts bathed by helper: Buttocks     Bathing assist Assist Level: Supervision/Verbal cueing     Upper Body Dressing/Undressing Upper body dressing   What is the patient wearing?: Pull over shirt    Upper body assist Assist Level: Set up assist    Lower Body Dressing/Undressing Lower body dressing      What is the patient wearing?: Underwear/pull up, Pants     Lower body assist Assist for lower body dressing: Supervision/Verbal cueing     Toileting Toileting    Toileting assist Assist for toileting: Supervision/Verbal cueing     Transfers Chair/bed transfer  Transfers assist     Chair/bed transfer assist level: Contact Guard/Touching assist     Locomotion Ambulation   Ambulation assist      Assist level: Supervision/Verbal cueing Assistive device: (none) Max distance: 150 ft    Walk 10 feet activity   Assist     Assist level: Supervision/Verbal cueing Assistive device: Other (comment)(none)   Walk 50 feet activity   Assist Walk 50 feet with 2 turns activity did not occur: Safety/medical concerns  Assist level: Supervision/Verbal cueing Assistive device: Other (comment)(none)    Walk 150 feet activity   Assist Walk 150 feet activity did not occur: Safety/medical concerns  Assist level: Supervision/Verbal cueing      Walk 10 feet on uneven surface  activity   Assist  Walk 10 feet on uneven surfaces activity did not occur: Safety/medical concerns         Wheelchair     Assist Will patient use wheelchair at discharge?: No Type of Wheelchair: Manual    Wheelchair assist level: Supervision/Verbal cueing Max wheelchair distance: 35 t(BUE)    Wheelchair  50 feet with 2 turns activity    Assist    Wheelchair 50 feet with 2 turns activity did not occur: Safety/medical concerns   Assist Level: Supervision/Verbal cueing   Wheelchair 150 feet activity     Assist Wheelchair 150 feet activity did not occur: Safety/medical concerns   Assist Level: Supervision/Verbal cueing    Medical Problem List and Plan: 1.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  Probable cardioembolic - -ASA for secondary stroke prophylaxis. Plavix to begin on 06/06/2018, ASA to stop 2/25 2. DVT Prophylaxis/Anticoagulation: Pharmaceutical:Lovenox, CBC normal 2/12 3.Headaches/Pain Management:tylenol prn. -tramadol for more severe headache  -elevate LUE, pain/swelling improving 4. Mood:LCSW to follow for evaluation and support. 5. Neuropsych: This patientiscapable of making decisions on herown behalf. 6. Skin/Wound Care:routine pressure relief measures.elevation LUE for bruising/edema 7. Fluids/Electrolytes/Nutrition:Monitor I/O. CMET normal except mildly low alb- intake ok will d/c IV 8. HTN: avoid over treatment, but may need to consider medication if BP remains consistently elevated. Monitor for now Vitals:   06/13/18 1950 06/14/18 0425  BP: 112/64 113/79  Pulse: 65 78  Resp: 16 18  Temp: 98.6 F (37 C) 98.5 F (36.9 C)  SpO2: 100% 99%  controlled 2/20  9. Left arm hematoma after infiltration of IV TPA- no drop in hgb-monitor   -Appears stable to slightly improved- not painful 10.  Dizziness when walking suspect orthostatic vitals negative 2/14  using TEDs 11.  Outpt event monitor planned 12.  Hyperlipidemia - Lipitor LOS: 9 days A FACE TO FACE EVALUATION WAS PERFORMED  Charlett Blake 06/14/2018, 8:44 AM

## 2018-06-14 NOTE — Progress Notes (Signed)
Social Work Discharge Note  The overall goal for the admission was met for: DC SAT-2/22  Discharge location: Yes-HOME WITH HUSBAND WHO CAN PROVIDE SUPERVISION LEVEL  Length of Stay: Yes-11 DAYS  Discharge activity level: Yes-SUPERVISION LEVEL  Home/community participation: Yes  Services provided included: MD, RD, PT, OT, SLP, RN, CM, TR, Pharmacy and SW  Financial Services: Medicare and Private Insurance: [place]  Follow-up services arranged: Outpatient: [facility]OT WILL CALL HUSBAND TO SET UP APPOINTMENTS  Comments (or additional information):HUSBAND WAS HERE DAILY TO PARTICIPATE IN THERAPIES AND BOTH FEEL COMFORTABLE WITH HER CARE AND READY TO GO HOME SAT.  Patient/Family verbalized understanding of follow-up arrangements: Yes  Individual responsible for coordination of the follow-up plan: [name]-HUSBAND  Confirmed correct DME delivered: Elease Hashimoto 06/14/2018    Elease Hashimoto

## 2018-06-15 ENCOUNTER — Inpatient Hospital Stay (HOSPITAL_COMMUNITY): Payer: Medicare Other | Admitting: Occupational Therapy

## 2018-06-15 ENCOUNTER — Inpatient Hospital Stay (HOSPITAL_COMMUNITY): Payer: Medicare Other | Admitting: Physical Therapy

## 2018-06-15 DIAGNOSIS — R7303 Prediabetes: Secondary | ICD-10-CM

## 2018-06-15 MED ORDER — ASPIRIN 81 MG PO TABS: 81.0000 mg | ORAL_TABLET | Freq: Every day | ORAL | 0 refills | Status: DC

## 2018-06-15 MED ORDER — ATORVASTATIN CALCIUM 40 MG PO TABS: 40.0000 mg | ORAL_TABLET | Freq: Every day | ORAL | 0 refills | Status: DC

## 2018-06-15 MED ORDER — CLOPIDOGREL BISULFATE 75 MG PO TABS: 75.0000 mg | ORAL_TABLET | Freq: Every day | ORAL | 0 refills | Status: DC

## 2018-06-15 MED ORDER — PANTOPRAZOLE SODIUM 40 MG PO TBEC: 40.0000 mg | DELAYED_RELEASE_TABLET | Freq: Every day | ORAL | 0 refills | Status: DC

## 2018-06-15 NOTE — Progress Notes (Addendum)
Physical Therapy Discharge Summary  Patient Details  Name: Mary Mercado MRN: [id number] Date of Birth: [birth year]  Today's Date: 06/15/2018 PT Individual Time: [id number] and [id number] PT Individual Time Calculation (min): 69 min and 28 min   Patient has met 10 of 10 long term goals due to improved activity tolerance, improved balance, improved postural control, increased strength, ability to compensate for deficits, functional use of  left upper extremity and left lower extremity, improved attention, improved awareness and improved coordination.  Patient to discharge at an ambulatory level supervision without AD.   Patient's care partner is independent to provide the necessary physical and cognitive assistance at discharge.  Reasons goals not met: n/a  Recommendation:  Patient will benefit from ongoing skilled PT services in outpatient setting to continue to advance safe functional mobility, address ongoing impairments in L NMR, endurance, strength, balance, and minimize fall risk.  Equipment: No equipment provided  Reasons for discharge: treatment goals met and discharge from hospital  Patient/family agrees with progress made and goals achieved: Yes  PT Treatment: Treatment 1: Pt received in w/c & agreeable to tx. No c/o pain reported with pt reporting feeling sensation in LUE/LLE on this date. Pt's husband arrived for session & participated in caregiver training. Pt tied tennis shoes with extra time to concentrate on task for forced use of LUE for NMR. Pt ambulates around unit without AD & with husband demonstrating proper positioning on pt's left side, with pt able to ambulate with close supervision but pt's husband electing to provide CGA. Pt negotiates uneven surface without AD & supervision, 12 steps (6") with R ascending rail and close supervision with step-over-step pattern, complete floor transfer & retreive object from floor with supervision, car transfer at small SUV simulated  height with supervision and bed mobility in apartment with independence. Pt changes pillowcases with task focusing on LUE forced use for NMR. Provided pt with OTAGO Level B exercises with therapist educating her & pt demonstrating ability to complete all exercises, using BUE support PRN for increased balance/stability. Pt's husband provided assistance for ambulating in/out of bathroom & he was checked off to assist pt and educated on use of chair belt alarm; RN made aware. Pt left sitting in recliner with chair alarm donned, husband present & all needs in reach.  Treatment 2: Pt received in bed with husband present for session & providing supervision for gait with proper positioning in relation to pt. Pt transfers to EOB with supervision and ambulates room<>gym without AD & supervision. Therapist tested pt's LLE/LUE sensation (impaired sensation & proprioception LUE, absent sensation & proprioception in LLE). Pt transferred into quadruped on mat table and engaged in lifting 1 extremity then progressing to bird dog exercises with CGA<>min assist with task focusing on core/trunk strengthening & control and L NMR. At end of session pt left in room with husband assisting her to the bathroom.  PT Discharge Precautions/Restrictions Precautions Precautions: Fall Restrictions Weight Bearing Restrictions: No  Vision/Perception  Pt wears glasses for reading only & has hx of cataracts. Pt denies changes in baseline vision.  Perception Perception: Impaired Inattention/Neglect: Does not attend to left side of body   Cognition Overall Cognitive Status: Within Functional Limits for tasks assessed Arousal/Alertness: Awake/alert Orientation Level: Oriented X4 Memory: Appears intact Awareness: Appears intact Problem Solving: Appears intact Safety/Judgment: Appears intact  Sensation Sensation Light Touch: Impaired Detail(decreased sensation LUE, no sensation to light touch in LLE) Light Touch Impaired  Details: Impaired LUE;Absent LLE Proprioception: Impaired Detail Proprioception Impaired Details:  Impaired LUE;Absent LLE   Motor  Motor Motor: Abnormal postural alignment and control Motor - Discharge Observations: generalized deconditioning & fatigue   Mobility Bed Mobility Bed Mobility: Rolling Right;Rolling Left;Supine to Sit;Sit to Supine Rolling Right: Independent Rolling Left: Independent Supine to Sit: Independent Sit to Supine: Independent Transfers Transfers: Sit to Stand;Stand to Sit Sit to Stand: Independent Stand to Sit: Supervision/Verbal cueing Stand Pivot Transfers: Independent with assistive device  Locomotion  Gait Ambulation: Yes Gait Assistance: Supervision/Verbal cueing Gait Distance (Feet): 150 Feet Assistive device: None Gait Gait: Yes Gait Pattern: Impaired Gait Pattern: (decreased weight shifting) Gait velocity: decreased Stairs / Additional Locomotion Stairs: Yes Stairs Assistance: Supervision/Verbal cueing Stair Management Technique: One rail Right Number of Stairs: 12 Height of Stairs: 6(inches) Ramp: Supervision/Verbal cueing(ambulatory without AD) Wheelchair Mobility Wheelchair Mobility: No   Trunk/Postural Assessment  Cervical Assessment Cervical Assessment: Within Functional Limits Thoracic Assessment Thoracic Assessment: Within Functional Limits Lumbar Assessment Lumbar Assessment: Within Functional Limits Postural Control Postural Control: Deficits on evaluation(delayed righting reactions)   Balance Balance Balance Assessed: Yes Standardized Balance Assessment Standardized Balance Assessment: Berg Balance Test on 06/13/18 Berg Balance Test Sit to Stand: Able to stand without using hands and stabilize independently Standing Unsupported: Able to stand 2 minutes with supervision Sitting with Back Unsupported but Feet Supported on Floor or Stool: Able to sit safely and securely 2 minutes Stand to Sit: Sits safely with minimal  use of hands Transfers: Able to transfer safely, minor use of hands Standing Unsupported with Eyes Closed: Able to stand 10 seconds with supervision Standing Ubsupported with Feet Together: Able to place feet together independently and stand for 1 minute with supervision From Standing, Reach Forward with Outstretched Arm: Can reach forward >12 cm safely (5") From Standing Position, Pick up Object from Floor: Able to pick up shoe, needs supervision From Standing Position, Turn to Look Behind Over each Shoulder: Needs supervision when turning Turn 360 Degrees: Needs close supervision or verbal cueing Standing Unsupported, Alternately Place Feet on Step/Stool: Able to complete 4 steps without aid or supervision(completes 8 steps but with close supervision) Standing Unsupported, One Foot in Front: Able to plae foot ahead of the other independently and hold 30 seconds Standing on One Leg: Tries to lift leg/unable to hold 3 seconds but remains standing independently Total Score: 39  Extremity Assessment  RUE Assessment RUE Assessment: Within Functional Limits  RLE Assessment RLE Assessment: Within Functional Limits LLE WFL - not formally tested   Waunita Schooner 06/15/2018, 3:38 PM

## 2018-06-15 NOTE — Progress Notes (Signed)
Occupational Therapy Session Note  Patient Details  Name: Mary Mercado MRN: [id number] Date of Birth: [birth year]  Today's Date: 06/15/2018 OT Individual Time: 0803-0900 and 5681-2751 OT Individual Time Calculation (min): 57 min and 30 min   Short Term Goals: Week 2:  OT Short Term Goal 1 (Week 2): STG = LTGs due to remaining ELOS  Skilled Therapeutic Interventions/Progress Updates:    1) Completed ADL retraining at overall supervision to Mod I level.  Pt with no c/o pain.  Pt ambulated around room to gather clothing prior to bathing at shower level with supervision.  Pt completed bathing at overall supervision/setup level.  Pt demonstrating improved dynamic standing balance and safety awareness during self-care tasks.  Pt completed dressing at Independent level at sit > stand.  Grooming completed at sit > stand level with pt standing for simple tasks and seated when drying hair and applying make up for energy conservation.  Pt dropped one item when attempting to transport items in Rt and Lt hands, due to inattention and decreased sensation in LUE.  Pt remained upright in w/c at sink to complete grooming tasks.  2) Treatment session with focus on LUE NMR with gross and fine motor control.  Pt received upright in recliner with no c/o pain.  Discussed HEP to continue to address LUE while awaiting follow up OPOT.  Provided pt with fine and gross motor control handout and educated pt and husband on each exercise as well as familiar, functional tasks to continue to engage in.  Pt able to demonstrate understanding of each exercise, some more challenging than others.  Pt pleased with progress and encouraged to continue to focus on functional use of LUE.  Completed 9 hole peg test in 1:13 on Lt, much improved from 2:49 beginning of week. Pt reports no further questions and ready for d/c in the AM.  Therapy Documentation Precautions:  Precautions Precautions: Fall Precaution Comments: bleeding  precautions Restrictions Weight Bearing Restrictions: No Pain:  Pt with no c/o pain   Therapy/Group: Individual Therapy  Simonne Come 06/15/2018, 8:54 AM

## 2018-06-15 NOTE — Progress Notes (Signed)
[facility] PHYSICAL MEDICINE & REHABILITATION PROGRESS NOTE   Subjective/Complaints:  Discussed d/c, doing ADLs with OT standing without AD, still needs supervison level  ROS: Patient denies , nausea, vomiting, diarrhea, cough, shortness of breath or chest pain, joint or back pain, headache, or mood change.   Objective:   No results found. No results for input(s): WBC, HGB, HCT, PLT in the last 72 hours. No results for input(s): NA, K, CL, CO2, GLUCOSE, BUN, CREATININE, CALCIUM in the last 72 hours.  Intake/Output Summary (Last 24 hours) at 06/15/2018 0809 Last data filed at 06/15/2018 0757 Gross per 24 hour  Intake 480 ml  Output -  Net 480 ml     Physical Exam: Vital Signs Blood pressure 138/69, pulse (!) 58, temperature 97.8 F (36.6 C), temperature source Oral, resp. rate 17, height 5\' 5"  (1.651 m), weight 64.2 kg, SpO2 98 %.  Constitutional: No distress . Vital signs reviewed. HEENT: EOMI, oral membranes moist Neck: supple Cardiovascular: RRR without murmur. No JVD    Respiratory: CTA Bilaterally without wheezes or rales. Normal effort    GI: BS +, non-tender, non-distended  Extremities: No clubbing, cyanosis. 1+ LUE edema Skin: substantial bruising LUE  Neurologic: Cranial nerves II through XII intact, motor strength is 5/5 in RIght and 4/5 Left  deltoid, bicep, tricep, grip, hip flexor, knee extensors, ankle dorsiflexor and plantar flexor Sensory exam: reduced  sensation to light touch and proprioception inLeft  upper and lower extremities, insensate  Left neglect on confrontation testing Musculoskeletal: Edema left upper extremity stable    Assessment/Plan: 1. Functional deficits secondary to RIght fronto parietal infarct which require 3+ hours per day of interdisciplinary therapy in a comprehensive inpatient rehab setting.  Physiatrist is providing close team supervision and 24 hour management of active medical problems listed below.  Physiatrist and rehab team  continue to assess barriers to discharge/monitor patient progress toward functional and medical goals  Care Tool:  Bathing    Body parts bathed by patient: Right arm, Left arm, Chest, Abdomen, Front perineal area, Right upper leg, Left upper leg, Right lower leg, Left lower leg, Face, Buttocks   Body parts bathed by helper: Buttocks     Bathing assist Assist Level: Supervision/Verbal cueing     Upper Body Dressing/Undressing Upper body dressing   What is the patient wearing?: Pull over shirt    Upper body assist Assist Level: Set up assist    Lower Body Dressing/Undressing Lower body dressing      What is the patient wearing?: Underwear/pull up, Pants     Lower body assist Assist for lower body dressing: Supervision/Verbal cueing     Toileting Toileting    Toileting assist Assist for toileting: Supervision/Verbal cueing     Transfers Chair/bed transfer  Transfers assist     Chair/bed transfer assist level: Supervision/Verbal cueing     Locomotion Ambulation   Ambulation assist      Assist level: Supervision/Verbal cueing Assistive device: Other (comment)(none) Max distance: >1000'   Walk 10 feet activity   Assist     Assist level: Supervision/Verbal cueing Assistive device: Other (comment)(none)   Walk 50 feet activity   Assist Walk 50 feet with 2 turns activity did not occur: Safety/medical concerns  Assist level: Supervision/Verbal cueing Assistive device: Other (comment)(none)    Walk 150 feet activity   Assist Walk 150 feet activity did not occur: Safety/medical concerns  Assist level: Supervision/Verbal cueing Assistive device: Other (comment)(none)    Walk 10 feet on uneven surface  activity   Assist Walk 10 feet on uneven surfaces activity did not occur: Safety/medical concerns         Wheelchair     Assist Will patient use wheelchair at discharge?: No Type of Wheelchair: Manual    Wheelchair assist level:  Supervision/Verbal cueing Max wheelchair distance: 35 t(BUE)    Wheelchair 50 feet with 2 turns activity    Assist    Wheelchair 50 feet with 2 turns activity did not occur: Safety/medical concerns   Assist Level: Supervision/Verbal cueing   Wheelchair 150 feet activity     Assist Wheelchair 150 feet activity did not occur: Safety/medical concerns   Assist Level: Supervision/Verbal cueing    Medical Problem List and Plan: 1.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  Probable cardioembolic - -ASA for secondary stroke prophylaxis. Plavix to begin on 06/06/2018, ASA to stop 2/25, plan d/c 2/22 Discussed no driving 2. DVT Prophylaxis/Anticoagulation: Pharmaceutical:Lovenox, CBC normal 2/12 3.Headaches/Pain Management:tylenol prn. -tramadol for more severe headache  -elevate LUE, pain/swelling improving 4. Mood:LCSW to follow for evaluation and support. 5. Neuropsych: This patientiscapable of making decisions on herown behalf. 6. Skin/Wound Care:routine pressure relief measures.elevation LUE for bruising/edema 7. Fluids/Electrolytes/Nutrition:Monitor I/O.low intake I 444ml      8. HTN: avoid over treatment, but may need to consider medication if BP remains consistently elevated. Monitor for now Vitals:   06/14/18 2009 06/15/18 0442  BP: 120/79 138/69  Pulse: 67 (!) 58  Resp: 18 17  Temp: 98 F (36.7 C) 97.8 F (36.6 C)  SpO2: 99% 98%  controlled 2/21  9. Left arm hematoma after infiltration of IV TPA- no drop in hgb-monitor   -Appears stable to slightly improved- not painful 10.  Dizziness when walking suspect orthostatic vitals negative 2/14  using TEDs 11.  Outpt event monitor planned 12.  Hyperlipidemia - Lipitor LOS: 10 days A FACE TO FACE EVALUATION WAS PERFORMED  Charlett Blake 06/15/2018, 8:09 AM

## 2018-06-15 NOTE — Discharge Summary (Signed)
Physician Discharge Summary  Patient ID: Mary Mercado MRN: [id number] DOB/AGE: 09-22-1940 78 y.o.  Admit date: 06/05/2018 Discharge date: 06/16/2018  Discharge Diagnoses:  Principal Problem:   Acute ischemic right MCA stroke [facility]) Active Problems:   Palpitations   Hyperlipidemia LDL goal <70   Prediabetes   Discharged Condition: stable   Significant Diagnostic Studies: N/A   Labs:  Basic Metabolic Panel: BMP Latest Ref Rng & Units 06/06/2018 05/31/2018 05/05/2015  Glucose 70 - 99 mg/dL 104(H) 123(H) 96  BUN 8 - 23 mg/dL 13 13 13   Creatinine 0.44 - 1.00 mg/dL 0.67 0.74 0.68  Potassium 3.5 - 5.1 mmol/L 3.9 4.6 5.6(H)  Chloride 98 - 111 mmol/L 106 99 102  CO2 22 - 32 mmol/L 28 26 33(H)  Calcium 8.9 - 10.3 mg/dL 9.0 9.5 10.2   BMP Latest Ref Rng & Units 06/06/2018 05/31/2018 05/05/2015  Glucose 70 - 99 mg/dL 104(H) 123(H) 96  BUN 8 - 23 mg/dL 13 13 13   Creatinine 0.44 - 1.00 mg/dL 0.67 0.74 0.68  Sodium 135 - 145 mmol/L 140 139 144  Potassium 3.5 - 5.1 mmol/L 3.9 4.6 5.6(H)  Chloride 98 - 111 mmol/L 106 99 102  CO2 22 - 32 mmol/L 28 26 33(H)  Calcium 8.9 - 10.3 mg/dL 9.0 9.5 10.2   CBC: CBC Latest Ref Rng & Units 06/06/2018 05/31/2018 05/05/2015  WBC 4.0 - 10.5 K/uL 7.0 6.1 6.4  Hemoglobin 12.0 - 15.0 g/dL 12.6 14.5 13.9  Hematocrit 36.0 - 46.0 % 38.6 45.1 42.3  Platelets 150 - 400 K/uL 224 236 249.0    CBG: No results for input(s): GLUCAP in the last 168 hours.  Brief HPI:   Mary Mercado is a 78 female with history of allergies, BPV, palpitations otherwise in good health who was admitted on 05/31/2018 LUE weakness, numbness LUE LLE and dizziness.  CT head negative and she received TPA.  MRI brain done revealing acute right frontoparietal infarct with small amount of and right parietal lobe.  TEE done revealing EF of 60-65% with severely dilated atrium, sclerotic AV and no thrombus. Loop recorder not placed as patient reported history of palpitations prior to admission.  Dr. Erlinda Hong  recommended 30 day cardiac event monitor after discharge to rule out A fib and DAPT X 3 weeks followed by Plavix alone. Therapy evaluations done revealing limitations due to Left sided weakness and CIR recommended due to functional decline.    Hospital Course: Mary Mercado was admitted to rehab 06/05/2018 for inpatient therapies to consist of PT and OT at least three hours five days a week. Past admission physiatrist, therapy team and rehab RN have worked together to provide customized collaborative inpatient rehab.  She was maintained on aspirin /Plavix for secondary stroke prevention.  Aspirin to be DC'd on 2/28.  P.o. intake has been good and she is continent of bowel and bladder.  Headaches are managed with as needed use of Tylenol.  Blood pressures have been monitored on a twice daily basis and are well controlled.  Left arm hematoma that developed after IV infiltration post TPA has almost resolved. Follow-up CBC showed mild drop in H&H but no signs of bleeding.  Follow-up check of lytes showed that renal status and potassium levels are stable.  Orthostatic symptoms have resolved with increase in activity tolerance and endurance.  She has made great gains during her rehab stay however continues to requires cues for safety due to left hemibody neglect from sensory deficits. Supervision recommended for safety.  She will  continue to receive further follow-up outpatient PT and OT at [facility] neuro rehab after discharge.     Rehab course: During patient's stay in rehab weekly team conferences were held to monitor patient's progress, set goals and discuss barriers to discharge. At admission, patient required mod assist with mobility and min assist with mobility. She  has had improvement in activity tolerance, balance, postural control as well as ability to compensate for deficits. She has had improvement in functional use LUE  and LLE as well as improvement in awareness. She is able to complete ADL tasks at  modified independent level. She is at modified independent to supervision level for transfers and to ambulate 150' without AD.  Family education was completed with husband regarding all aspects of care and safety.    Disposition:   Home  Diet: Heart Healthy  Special Instructions: 1. No driving or strenuous activity till cleared by MD.  2. Repeat CBC in 1-2 weeks.   Discharge Instructions    Ambulatory referral to Cardiology   Complete by:  As directed    Needs cardiac monitor/Post stroke   Ambulatory referral to Physical Medicine Rehab   Complete by:  As directed    1-2 weeks transitional care appt     Allergies as of 06/16/2018      Reactions   Acetaminophen Rash   Rash/hives      Medication List    STOP taking these medications   enoxaparin 40 MG/0.4ML injection Commonly known as:  LOVENOX   traMADol 50 MG tablet Commonly known as:  ULTRAM     TAKE these medications   aspirin 81 MG tablet Take 1 tablet (81 mg total) by mouth daily. Stop taking aspirin after 2/28. What changed:  additional instructions   atorvastatin 40 MG tablet Commonly known as:  LIPITOR Take 1 tablet (40 mg total) by mouth daily at 6 PM.   clopidogrel 75 MG tablet Commonly known as:  PLAVIX Take 1 tablet (75 mg total) by mouth daily.   pantoprazole 40 MG tablet Commonly known as:  PROTONIX Take 1 tablet (40 mg total) by mouth daily.      Follow-up Information    [name], MD Follow up on 06/19/2018.   Specialty:  Family Medicine Why:  APPOINTMENT @ 11:00 AM Contact information: [place] [address] [phone]        Charlett Blake, MD Follow up.   Specialty:  Physical Medicine and Rehabilitation Why:  Francis Dowse will call you with follow up appointment Contact information: [place] [address] [phone]        Tyrone. Call in 3 day(s).   Why:  for follow up appointment Contact  information: [address]     [place] [address] [phone]          Signed: Bary Leriche 06/18/2018, 2:01 PM

## 2018-06-15 NOTE — Discharge Instructions (Signed)
Inpatient Rehab Discharge Instructions  Mary Mercado Discharge date and time: 06/15/18   Activities/Precautions/ Functional Status: Activity: no lifting, driving, or strenuous exercise till cleared by MD Diet: cardiac diet Wound Care: none needed   Functional status:  ___ No restrictions     ___ Walk up steps independently ___ 24/7 supervision/assistance   ___ Walk up steps with assistance _X__ Intermittent supervision/assistance  ___ Bathe/dress independently ___ Walk with walker     ___ Bathe/dress with assistance ___ Walk Independently    ___ Shower independently ___ Walk with assistance    _X__ Shower with assistance _X__ No alcohol     ___ Return to work/school ________  Special Instructions:  COMMUNITY REFERRALS UPON DISCHARGE:    Outpatient: PT & OT  Agency:[facility] Phone:[phone]   Date of Last Service:06/16/2018  Appointment Date/Time:WILL SET UP APPOINTMENTS WITH HUSBAND  Medical Equipment/Items Ordered:TUB SEAT-HUSBAND TO GET ON HIS OWN    GENERAL COMMUNITY RESOURCES FOR PATIENT/FAMILY: Support Groups:[facility] SUPPORT GROUP THE SECOND Thursday @ 6:00-7:00 PM ON THE REHAB UNIT QUESTIONS CALL AMY [phone]   STROKE/TIA DISCHARGE INSTRUCTIONS SMOKING Cigarette smoking nearly doubles your risk of having a stroke & is the single most alterable risk factor  If you smoke or have smoked in the last 12 months, you are advised to quit smoking for your health.  Most of the excess cardiovascular risk related to smoking disappears within a year of stopping.  Ask you doctor about anti-smoking medications  [place] Quit Line: 1-800-QUIT NOW  Free Smoking Cessation Classes [phone]  CHOLESTEROL Know your levels; limit fat & cholesterol in your diet  Lipid Panel     Component Value Date/Time   CHOL 243 (H) 06/01/2018 0548   TRIG 80 06/01/2018 0548   HDL 110 06/01/2018 0548   CHOLHDL 2.2 06/01/2018 0548   VLDL 16 06/01/2018 0548   LDLCALC 117 (H)  06/01/2018 0548      Many patients benefit from treatment even if their cholesterol is at goal.  Goal: Total Cholesterol (CHOL) less than 160  Goal:  Triglycerides (TRIG) less than 150  Goal:  HDL greater than 40  Goal:  LDL (LDLCALC) less than 100   BLOOD PRESSURE American Stroke Association blood pressure target is less that 120/80 mm/Hg  Your discharge blood pressure is:  BP: 138/69  Monitor your blood pressure  Limit your salt and alcohol intake  Many individuals will require more than one medication for high blood pressure  DIABETES (A1c is a blood sugar average for last 3 months) Goal HGBA1c is under 7% (HBGA1c is blood sugar average for last 3 months)  Diabetes:     Lab Results  Component Value Date   HGBA1C 5.9 (H) 06/01/2018     Your HGBA1c can be lowered with medications, healthy diet, and exercise.  Check your blood sugar as directed by your physician  Call your physician if you experience unexplained or low blood sugars.  PHYSICAL ACTIVITY/REHABILITATION Goal is 30 minutes at least 4 days per week  Activity: No driving, Therapies: see above Return to work: N/A  Activity decreases your risk of heart attack and stroke and makes your heart stronger.  It helps control your weight and blood pressure; helps you relax and can improve your mood.  Participate in a regular exercise program.  Talk with your doctor about the best form of exercise for you (dancing, walking, swimming, cycling).  DIET/WEIGHT Goal is to maintain a healthy weight  Your discharge diet is:  Diet  Order            Diet Heart Room service appropriate? Yes; Fluid consistency: Thin  Diet effective now             liquids Your height is:  Height: 5\' 5"  (165.1 cm) Your current weight is: Weight: 64.2 kg Your Body Mass Index (BMI) is:  BMI (Calculated): 23.55  Following the type of diet specifically designed for you will help prevent another stroke.  You are at goal weight   Your goal  Body Mass Index (BMI) is 19-24.  Healthy food habits can help reduce 3 risk factors for stroke:  High cholesterol, hypertension, and excess weight.  RESOURCES Stroke/Support Group:  Call [phone]   STROKE EDUCATION PROVIDED/REVIEWED AND GIVEN TO PATIENT Stroke warning signs and symptoms How to activate emergency medical system (call 911). Medications prescribed at discharge. Need for follow-up after discharge. Personal risk factors for stroke. Pneumonia vaccine given:  Flu vaccine given:  My questions have been answered, the writing is legible, and I understand these instructions.  I will adhere to these goals & educational materials that have been provided to me after my discharge from the hospital.    My questions have been answered and I understand these instructions. I will adhere to these goals and the provided educational materials after my discharge from the hospital.  Patient/Caregiver Signature _______________________________ Date __________  Clinician Signature _______________________________________ Date __________  Please bring this form and your medication list with you to all your follow-up doctor's appointments.

## 2018-06-15 NOTE — Progress Notes (Signed)
Occupational Therapy Discharge Summary  Patient Details  Name: Mary Mercado MRN: [id number] Date of Birth: 27-Jul-1940  Patient has met 40 of 15 long term goals due to improved activity tolerance, improved balance, postural control, ability to compensate for deficits, functional use of  LEFT upper and LEFT lower extremity, improved awareness and improved coordination.  Patient to discharge at overall Supervision level.  Patient's care partner is independent to provide the necessary physical assistance at discharge.  Patient's husband has been present for majority of treatment sessions and has demonstrated and verbalized ability to provide supervision for mobility and supervision - Mod I level during BADLs.  Reasons goals not met: NA  Recommendation:  Patient will benefit from ongoing skilled OT services in outpatient setting to continue to advance functional skills in the area of BADL, iADL and Reduce care partner burden.  Equipment: No equipment provided  Reasons for discharge: treatment goals met and discharge from hospital  Patient/family agrees with progress made and goals achieved: Yes  OT Discharge Precautions/Restrictions  Precautions Precautions: Fall Vital Signs Therapy Vitals Temp: 98 F (36.7 C) Pulse Rate: 70 Resp: 17 BP: 115/70 Patient Position (if appropriate): Lying Oxygen Therapy SpO2: 98 % O2 Device: Room Air ADL ADL Eating: Independent Where Assessed-Eating: Chair Grooming: Independent Where Assessed-Grooming: Standing at sink, Sitting at sink Upper Body Bathing: Setup Where Assessed-Upper Body Bathing: Shower Lower Body Bathing: Setup Where Assessed-Lower Body Bathing: Shower Upper Body Dressing: Modified independent (Device) Where Assessed-Upper Body Dressing: Chair Lower Body Dressing: Independent Where Assessed-Lower Body Dressing: Chair Toileting: Supervision/safety Where Assessed-Toileting: Glass blower/designer: Close supervision Hydrographic surveyor Method: Counselling psychologist: Energy manager: Close supervision Social research officer, government Method: Radiographer, therapeutic: Civil engineer, contracting with back, Grab bars Vision Baseline Vision/History: Cataracts;Wears glasses Wears Glasses: Reading only Patient Visual Report: No change from baseline Vision Assessment?: Yes Eye Alignment: Within Functional Limits Ocular Range of Motion: Within Functional Limits Alignment/Gaze Preference: Within Defined Limits Tracking/Visual Pursuits: Able to track stimulus in all quads without difficulty Saccades: Within functional limits Convergence: Within functional limits Visual Fields: No apparent deficits Perception  Perception: Impaired Inattention/Neglect: Does not attend to left side of body Cognition Overall Cognitive Status: Within Functional Limits for tasks assessed Arousal/Alertness: Awake/alert Orientation Level: Oriented X4 Attention: Sustained Sustained Attention: Appears intact Memory: Appears intact Awareness: Appears intact Problem Solving: Appears intact Safety/Judgment: Appears intact Sensation Sensation Light Touch: Impaired Detail Peripheral sensation comments: no sensation to light touch in LUE or LLE, emerging temperature sensation Light Touch Impaired Details: Absent LLE;Absent LUE Hot/Cold: Impaired by gross assessment Proprioception: Impaired Detail Proprioception Impaired Details: Impaired LUE;Absent LLE Stereognosis: Impaired by gross assessment Coordination Gross Motor Movements are Fluid and Coordinated: No Fine Motor Movements are Fluid and Coordinated: No Coordination and Movement Description: decreased timing with movements and coordination Finger Nose Finger Test: impaired coordination more pronounced L hand vs. R hand 9 Hole Peg Test: Rt: 27 seconds, L: 1:13 (unable to pick up pegs on 2/12 and 2:49 on 2/18) Extremity/Trunk Assessment RUE Assessment RUE  Assessment: Within Functional Limits General Strength Comments: 4+/5 throughout LUE Assessment LUE Assessment: Exceptions to [facility] Passive Range of Motion (PROM) Comments: WFL Active Range of Motion (AROM) Comments: WFL, impaired gross and fine motor coordination General Strength Comments: strength grossly 4/5   Ladaija Dimino 06/15/2018, 8:59 PM

## 2018-06-16 NOTE — Progress Notes (Signed)
Patient discharged to home per wheelchair accompanied by NT and husband; per patient discharge instructions done yesterday ; patient and husband has no further questions.

## 2018-06-16 NOTE — Progress Notes (Signed)
Mary Mercado is a 78 y.o. female who is admit following R fronto parietal infarcr.  Subjective: No new complaints. No new problems. Doing  Well and ready for d/c today   Objective: Vital signs in last 24 hours: Temp:  [98 F (36.7 C)-98.4 F (36.9 C)] 98.4 F (36.9 C) (02/22 0350) Pulse Rate:  [59-70] 59 (02/22 0350) Resp:  [17-18] 18 (02/22 0350) BP: (115-143)/(61-78) 142/78 (02/22 0350) SpO2:  [98 %-100 %] 100 % (02/22 0350) Weight change:  Last BM Date: 06/14/18  Intake/Output from previous day: 02/21 0701 - 02/22 0700 In: 580 [P.O.:580] Out: -    Physical Exam General: No apparent distress   HEENT: not dry Lungs: Normal effort. Lungs clear to auscultation, no crackles or wheezes. Cardiovascular: Regular rate and rhythm, no edema  Few ectopics Abdomen: S/NT/ND; BS(+) Musculoskeletal:  unchanged Neurological: No new neurological deficits Wounds: N/A    Skin: clear   Mental state: Alert, oriented, cooperative    Lab Results: BMET    Component Value Date/Time   NA 140 06/06/2018 0601   K 3.9 06/06/2018 0601   CL 106 06/06/2018 0601   CO2 28 06/06/2018 0601   GLUCOSE 104 (H) 06/06/2018 0601   BUN 13 06/06/2018 0601   CREATININE 0.67 06/06/2018 0601   CALCIUM 9.0 06/06/2018 0601   GFRNONAA >60 06/06/2018 0601   GFRAA >60 06/06/2018 0601   CBC    Component Value Date/Time   WBC 7.0 06/06/2018 0601   RBC 4.38 06/06/2018 0601   HGB 12.6 06/06/2018 0601   HCT 38.6 06/06/2018 0601   PLT 224 06/06/2018 0601   MCV 88.1 06/06/2018 0601   MCH 28.8 06/06/2018 0601   MCHC 32.6 06/06/2018 0601   RDW 12.7 06/06/2018 0601   LYMPHSABS 1.7 06/06/2018 0601   MONOABS 0.7 06/06/2018 0601   EOSABS 0.2 06/06/2018 0601   BASOSABS 0.1 06/06/2018 0601    Medications: I have reviewed the patient's current medications.  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 Stable for d/c Dyslipidemia Outpatient event monitor planned    Length of  stay, days: 11  Marletta Lor , MD 06/16/2018, 9:09 AM

## 2018-06-19 ENCOUNTER — Telehealth: Payer: Self-pay | Admitting: Registered Nurse

## 2018-06-19 ENCOUNTER — Encounter: Payer: Self-pay | Admitting: Family Medicine

## 2018-06-19 ENCOUNTER — Ambulatory Visit (INDEPENDENT_AMBULATORY_CARE_PROVIDER_SITE_OTHER): Payer: Medicare Other | Admitting: Family Medicine

## 2018-06-19 VITALS — BP 144/70 | HR 68 | Temp 98.2°F | Wt 134.1 lb

## 2018-06-19 DIAGNOSIS — Z23 Encounter for immunization: Secondary | ICD-10-CM

## 2018-06-19 DIAGNOSIS — Z8673 Personal history of transient ischemic attack (TIA), and cerebral infarction without residual deficits: Secondary | ICD-10-CM

## 2018-06-19 DIAGNOSIS — R7303 Prediabetes: Secondary | ICD-10-CM | POA: Diagnosis not present

## 2018-06-19 DIAGNOSIS — I10 Essential (primary) hypertension: Secondary | ICD-10-CM | POA: Diagnosis not present

## 2018-06-19 DIAGNOSIS — I63411 Cerebral infarction due to embolism of right middle cerebral artery: Secondary | ICD-10-CM

## 2018-06-19 MED ORDER — ATORVASTATIN CALCIUM 40 MG PO TABS: 40.0000 mg | ORAL_TABLET | Freq: Every day | ORAL | 3 refills | Status: DC

## 2018-06-19 MED ORDER — PANTOPRAZOLE SODIUM 40 MG PO TBEC: 40.0000 mg | DELAYED_RELEASE_TABLET | Freq: Every day | ORAL | 3 refills | Status: DC

## 2018-06-19 MED ORDER — CLOPIDOGREL BISULFATE 75 MG PO TABS: 75.0000 mg | ORAL_TABLET | Freq: Every day | ORAL | 3 refills | Status: DC

## 2018-06-19 NOTE — Telephone Encounter (Signed)
Transitional Care call Transitional Call Queestions answerd by Mr. Cowell  Patient name: Mary Mercado DOB: 07/07/1940 1. Are you/is patient experiencing any problems since coming home?No a. Are there any questions regarding any aspect of care? No 2. Are there any questions regarding medications administration/dosing? No a. Are meds being taken as prescribed? Yes b. "Patient should review meds with caller to confirm" Medication List Reviewed 3. Have there been any falls? No 4. Has Home Health been to the house and/or have they contacted you? Has a scheduled appointment with [facility].  a. If not, have you tried to contact them? NA b. Can we help you contact them? NA 5. Are bowels and bladder emptying properly? Yes a. Are there any unexpected incontinence issues? No b. If applicable, is patient following bowel/bladder programs? NA 6. Any fevers, problems with breathing, unexpected pain? No 7. Are there any skin problems or new areas of breakdown? No 8. Has the patient/family member arranged specialty MD follow up (ie cardiology/neurology/renal/surgical/etc.)?  Yes a. Can we help arrange? NA 9. Does the patient need any other services or support that we can help arrange? No 10. Are caregivers following through as expected in assisting the patient? Yes 11. Has the patient quit smoking, drinking alcohol, or using drugs as recommended? Mr. Adelsberger states Mrs. Ratcliffe doesn't smoke, drink alcohol or use illicit drugs.   Appointment date/time 06/28/2018  arrival time 10:00 for 10:20 appointment With [facility] [place]

## 2018-06-19 NOTE — Progress Notes (Signed)
   Subjective:    Patient ID: Mary Mercado, female    DOB: 08/31/1940, 78 y.o.   MRN: [id number]  HPI Here to follow up a hospital stay from 05-31-18 to 06-05-18 for a right frontal parietal stroke, and then a rehab stay from 06-05-18 to 06-16-18 under the care of Dr. Letta Pate. She presented with numbness and weakness of the left arm and leg and the left side of the face. She received TPA treatment which was able to reverse a large part of her deficits. No embolic source was found. 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 ECHO showed good systolic function with an EF of 60-65%, but both atria were severely enlarged. She is set to wear a 30 day cardiac monitor sometime soon. She is waiting to start outpatient OT and PT. She was found to be prediabetic with an A1c of 5.9% and she has a strong family hx of diabetes. Her BP has been stable. She will take aspirin and Plavix for a few more days, then on 06-22-18 she will stop aspirin and stay on Plavix alone.    Review of Systems  Constitutional: Negative.   Respiratory: Negative.   Cardiovascular: Negative.   Neurological: Positive for weakness. Negative for dizziness, tremors, seizures, syncope, facial asymmetry, speech difficulty, light-headedness, numbness and headaches.       Objective:   Physical Exam Constitutional:      Appearance: Normal appearance.     Comments: Walks normally without assistance   Cardiovascular:     Rate and Rhythm: Normal rate and regular rhythm.     Pulses: Normal pulses.     Heart sounds: Normal heart sounds.  Pulmonary:     Effort: Pulmonary effort is normal.     Breath sounds: Normal breath sounds.  Neurological:     Mental Status: She is alert and oriented to person, place, and time.     Cranial Nerves: No cranial nerve deficit.     Comments: The left arm shows no weakness. The left leg has 3/4+ strength             Assessment & Plan:    Transitional care visit after a right MCA stroke. She seems to be doing well. She will resume OT and PT soon. She will follow up with [facility], and she is waiting to hear about that appt. She will see Dr. Fransico Him on 07-04-18 for Cardiology follow up. I plan to see her back in about 3 months to follow the BP and to recheck an A1c. We spoke today about dietary advice to limit the sugars and the carbs in her diet.  [name], MD

## 2018-06-26 ENCOUNTER — Ambulatory Visit (INDEPENDENT_AMBULATORY_CARE_PROVIDER_SITE_OTHER): Payer: Medicare Other

## 2018-06-26 ENCOUNTER — Other Ambulatory Visit: Payer: Self-pay | Admitting: Physician Assistant

## 2018-06-26 DIAGNOSIS — R42 Dizziness and giddiness: Secondary | ICD-10-CM | POA: Diagnosis not present

## 2018-06-26 DIAGNOSIS — R002 Palpitations: Secondary | ICD-10-CM

## 2018-06-26 DIAGNOSIS — I639 Cerebral infarction, unspecified: Secondary | ICD-10-CM

## 2018-06-28 ENCOUNTER — Encounter: Payer: Self-pay | Admitting: Registered Nurse

## 2018-06-28 ENCOUNTER — Encounter: Payer: Medicare Other | Attending: Registered Nurse | Admitting: Registered Nurse

## 2018-06-28 VITALS — BP 152/72 | HR 64 | Ht 65.0 in | Wt 132.0 lb

## 2018-06-28 DIAGNOSIS — E785 Hyperlipidemia, unspecified: Secondary | ICD-10-CM | POA: Diagnosis not present

## 2018-06-28 DIAGNOSIS — I63511 Cerebral infarction due to unspecified occlusion or stenosis of right middle cerebral artery: Secondary | ICD-10-CM | POA: Insufficient documentation

## 2018-06-28 NOTE — Progress Notes (Signed)
Subjective:    Patient ID: Mary Mercado, female    DOB: 1941-01-25, 78 y.o.   MRN: [id number]  HPI: Mary Mercado is a 78 y.o. female who is here for transitional care visit. She arrived to [facility] Emergency department via EMS on 05/31/2018. She reported left arm weakness and decreased sensation in her left arm and left lower extremity.  CT Head Code Stroke WO Contrast.  IMPRESSION: 1. No acute finding by CT. 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  CT Angio Neck W or WO Contrast:  IMPRESSION: 1. No large vessel occlusion. 2. 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 3. Severe left vertebral artery origin stenosis. 4. Widely patent cervical carotid arteries. 5.  Aortic Atherosclerosis (ICD10-I70.0).  MR Brain WO Contrast IMPRESSION: Acute infarct right frontal parietal lobe. Small amount of hemorrhage in the right parietal infarct. Moderate chronic microvascular ischemic changes in the white matterand pons.  Mary Mercado was discharged home. She will be going to outpatient therapy at [facility], she has her scheduled appointments. She denies pain. She rates her pain 0. Reports good appetite.   Mary Mercado had cardiac monitor placed on 06/26/2018  Husband in room, all questions answered.   Pain Inventory Average Pain 0 Pain Right Now 0 My pain is no pain  In the last 24 hours, has pain interfered with the following? General activity 0 Relation with others 0 Enjoyment of life 0 What TIME of day is your pain at its worst? no pain Sleep (in general) Good  Pain is worse with: no pain Pain improves with: no pain Relief from Meds: no pain  Mobility walk without assistance how many minutes can you walk? 5 do you drive?  no Do you have any goals in this area?  yes  Function retired I need assistance with the following:  meal prep and household  duties  Neuro/Psych numbness  Prior Studies Any changes since last visit?  no  Physicians involved in your care Any changes since last visit?  no   Family History  Problem Relation Age of Onset  . Colon cancer Father   . Diabetes Mother   . Heart attack Mother        25  . Heart attack Brother 50   Social History   Socioeconomic History  . Marital status: Married    Spouse name: Not on file  . Number of children: Not on file  . Years of education: Not on file  . Highest education level: Not on file  Occupational History  . Not on file  Social Needs  . Financial resource strain: Not on file  . Food insecurity:    Worry: Not on file    Inability: Not on file  . Transportation needs:    Medical: Not on file    Non-medical: Not on file  Tobacco Use  . Smoking status: Never Smoker  . Smokeless tobacco: Never Used  Substance and Sexual Activity  . Alcohol use: No  . Drug use: No  . Sexual activity: Not on file  Lifestyle  . Physical activity:    Days per week: Not on file    Minutes per session: Not on file  . Stress: Not on file  Relationships  . Social connections:    Talks on phone: Not on file    Gets together: Not on file    Attends religious service: Not on file    Active member of club or organization:  Not on file    Attends meetings of clubs or organizations: Not on file    Relationship status: Not on file  Other Topics Concern  . Not on file  Social History Narrative  . Not on file   Past Surgical History:  Procedure Laterality Date  . Hemilaminectomy and microdiskectomy at L4-5 on the left.  12/10/2003  . TEE WITHOUT CARDIOVERSION N/A 06/05/2018   Procedure: TRANSESOPHAGEAL ECHOCARDIOGRAM (TEE);  Surgeon: Lelon Perla, MD;  Location: [facility] ENDOSCOPY;  Service: Cardiovascular;  Laterality: N/A;  loop   Past Medical History:  Diagnosis Date  . Allergy   . Vertigo, benign positional    BP (!) 162/96   Pulse 68   Ht 5\' 5"  (1.651 m)   Wt  132 lb (59.9 kg)   SpO2 96%   BMI 21.97 kg/m   Opioid Risk Score:   Fall Risk Score:  `1  Depression screen PHQ 2/9  Depression screen PHQ 2/9 06/14/2017  Decreased Interest 0  Down, Depressed, Hopeless 0  PHQ - 2 Score 0   Review of Systems     Objective:   Physical Exam Vitals signs and nursing note reviewed.  Constitutional:      Appearance: Normal appearance.  Neck:     Musculoskeletal: Normal range of motion and neck supple.  Cardiovascular:     Rate and Rhythm: Normal rate and regular rhythm.     Pulses: Normal pulses.     Heart sounds: Normal heart sounds.  Pulmonary:     Effort: Pulmonary effort is normal.     Breath sounds: Normal breath sounds.  Musculoskeletal:     Comments: Normal Muscle Bulk and Muscle Testing Reveals:  Upper Extremities: Full ROM and Muscle Strength on the Right 5/5 and Left 4/5  Lower Extremities: Full ROM and Muscle Strength 5/5 Arises from Table with ease Narrow Based  Gait   Skin:    General: Skin is warm and dry.  Neurological:     Mental Status: She is alert and oriented to person, place, and time.  Psychiatric:        Behavior: Behavior normal.           Assessment & Plan:  1. Acute Ischemic Right MCA Stroke: Has scheduled appointment with Neuro- Rehabilitation. Has a scheduled appointment with Neurology. Continue current medication regimen.  2. Hyperlipidemia: Continue Lipitor. Neurology Following.   20 minutes of face to face patient care time was spent during this visit. All questions were encouraged and answered.  F/U in 4-6 weeks with Dr. Letta Pate.

## 2018-07-02 ENCOUNTER — Encounter: Payer: Self-pay | Admitting: Physical Therapy

## 2018-07-02 ENCOUNTER — Ambulatory Visit: Payer: Medicare Other | Attending: Physical Medicine & Rehabilitation | Admitting: Physical Therapy

## 2018-07-02 ENCOUNTER — Ambulatory Visit: Payer: Medicare Other | Admitting: Occupational Therapy

## 2018-07-02 ENCOUNTER — Other Ambulatory Visit: Payer: Self-pay

## 2018-07-02 DIAGNOSIS — R208 Other disturbances of skin sensation: Secondary | ICD-10-CM

## 2018-07-02 DIAGNOSIS — R2681 Unsteadiness on feet: Secondary | ICD-10-CM

## 2018-07-02 DIAGNOSIS — R278 Other lack of coordination: Secondary | ICD-10-CM

## 2018-07-02 DIAGNOSIS — M6281 Muscle weakness (generalized): Secondary | ICD-10-CM

## 2018-07-02 DIAGNOSIS — R2689 Other abnormalities of gait and mobility: Secondary | ICD-10-CM

## 2018-07-02 NOTE — Therapy (Signed)
[facility] [address] [place], [place], [address] Phone: [phone]   Fax:  [phone]  Physical Therapy Treatment  Patient Details  Name: Mary Mercado MRN: [id number] Date of Birth: Jan 10, 1941 Referring Provider (PT): Kirsteins   Encounter Date: 07/02/2018  PT End of Session - 07/02/18 1334    Visit Number  1    Number of Visits  18    Date for PT Re-Evaluation  09/30/18    Authorization Type  Medicare, BCBS (will need 10th visit progress note)    PT Start Time  1231    PT Stop Time  1321    PT Time Calculation (min)  50 min    Activity Tolerance  Patient tolerated treatment well    Behavior During Therapy  [facility] for tasks assessed/performed       Past Medical History:  Diagnosis Date  . Allergy   . Vertigo, benign positional     Past Surgical History:  Procedure Laterality Date  . Hemilaminectomy and microdiskectomy at L4-5 on the left.  12/10/2003  . TEE WITHOUT CARDIOVERSION N/A 06/05/2018   Procedure: TRANSESOPHAGEAL ECHOCARDIOGRAM (TEE);  Surgeon: Lelon Perla, MD;  Location: [facility] ENDOSCOPY;  Service: Cardiovascular;  Laterality: N/A;  loop    There were no vitals filed for this visit.  Subjective Assessment - 07/02/18 1237    Subjective  Pt had a stroke, with L sided numbness on 06/05/2018, with d/c home 06/16/2018.  L side feels strong, just numb.  Wearing heart monitor now.  Do not need a cane or walker.  Feel like when I walk "my insides just get tense."    Patient is accompained by:  Family member   husband   Patient Stated Goals  Pt's goal for therapy is to be more comfortable with walking, walking with more ease.    Currently in Pain?  No/denies         [facility] PT Assessment - 07/02/18 1242      Assessment   Medical Diagnosis  CVA-L weakness    Referring Provider (PT)  Kirsteins    Onset Date/Surgical Date  06/05/18    Hand Dominance  Right      Precautions   Precautions  Fall    No driving     Balance Screen   Has the patient fallen in the past 6 months  No    Has the patient had a decrease in activity level because of a fear of falling?   Yes    Is the patient reluctant to leave their home because of a fear of falling?   No      Home Environment   Living Environment  Private residence    Living Arrangements  Spouse/significant other    Available Help at Discharge  Family    Type of [place] Access  Level entry    [place]  Two level;Able to live on main level with bedroom/bathroom   Recently moved to townhome-handicap accessible   Home Equipment  None      Prior Function   Level of Independence  Independent    Leisure  Enjoys singing in the choir, working out with trainer (not in the past year); enjoys going out with husband      Observation/Other Assessments   Focus on Therapeutic Outcomes (FOTO)   Functional Intake Status measure:  58 (rates walking several blocks as Limited alot)      Sensation  Light Touch  Impaired Detail;Impaired by gross assessment    Proprioception  Impaired by gross assessment      Strength   Left Hip Flexion  4/5    Left Knee Flexion  4+/5    Left Knee Extension  4+/5    Left Ankle Dorsiflexion  4/5      Transfers   Transfers  Sit to Stand;Stand to Sit    Sit to Stand  6: Modified independent (Device/Increase time);Without upper extremity assist;From chair/3-in-1    Five time sit to stand comments   9.06   wide BOS   Stand to Sit  6: Modified independent (Device/Increase time);Without upper extremity assist;To chair/3-in-1      Ambulation/Gait   Ambulation/Gait  Yes    Ambulation/Gait Assistance  5: Supervision;4: Min guard    Ambulation Distance (Feet)  200 Feet    Assistive device  None    Gait Pattern  Step-through pattern;Decreased arm swing - left;Decreased step length - left;Decreased stance time - left;Decreased dorsiflexion - left;Decreased weight shift to left;Decreased trunk rotation     Ambulation Surface  Level;Indoor    Gait velocity  13.34 sec = 2.46 ft/sec      Standardized Balance Assessment   Standardized Balance Assessment  Timed Up and Go Test      Timed Up and Go Test   Normal TUG (seconds)  11.66    TUG Comments  Scores >13.5 sec indicate increased fall risk      High Level Balance   High Level Balance Comments  Single limb stance:  6 sec RLE; 5.91 sec LLE      Functional Gait  Assessment   Gait assessed   Yes    Gait Level Surface  Walks 20 ft, slow speed, abnormal gait pattern, evidence for imbalance or deviates 10-15 in outside of the 12 in walkway width. Requires more than 7 sec to ambulate 20 ft.    Change in Gait Speed  Makes only minor adjustments to walking speed, or accomplishes a change in speed with significant gait deviations, deviates 10-15 in outside the 12 in walkway width, or changes speed but loses balance but is able to recover and continue walking.    Gait with Horizontal Head Turns  Performs head turns with moderate changes in gait velocity, slows down, deviates 10-15 in outside 12 in walkway width but recovers, can continue to walk.    Gait with Vertical Head Turns  Performs task with slight change in gait velocity (eg, minor disruption to smooth gait path), deviates 6 - 10 in outside 12 in walkway width or uses assistive device    Gait and Pivot Turn  Pivot turns safely in greater than 3 sec and stops with no loss of balance, or pivot turns safely within 3 sec and stops with mild imbalance, requires small steps to catch balance.    Step Over Obstacle  Is able to step over one shoe box (4.5 in total height) but must slow down and adjust steps to clear box safely. May require verbal cueing.    Gait with Narrow Base of Support  Ambulates less than 4 steps heel to toe or cannot perform without assistance.    Gait with Eyes Closed  Walks 20 ft, slow speed, abnormal gait pattern, evidence for imbalance, deviates 10-15 in outside 12 in walkway width.  Requires more than 9 sec to ambulate 20 ft.    Ambulating Backwards  Walks 20 ft, slow speed, abnormal gait pattern, evidence for  imbalance, deviates 10-15 in outside 12 in walkway width.   42.13   Steps  Alternating feet, must use rail.    Total Score  12    FGA comment:  Scores <22/30 indicate increased fall risk                           PT Education - 07/02/18 1334    Education Details  Educated pt/husband in benefits of weightbearing LLE, attending to LLE, use of varied textures rubbing along L side to help with sensation return; fall risk per FGA, need to use lighting and supervision of husband    Person(s) Educated  Patient;Spouse    Methods  Explanation    Comprehension  Verbalized understanding       PT Short Term Goals - 07/02/18 1353      PT SHORT TERM GOAL #1   Title  Pt will be independent with HEP for improved strength, balance, gait.  TARGET  08/03/2018    Time  5    Period  Weeks    Status  New    Target Date  08/03/18      PT SHORT TERM GOAL #2   Title  Pt will perform at least 8 of 10 reps of sit<>stand, with proper foot placement, no UE support for improved functional LLE strength and efficiency with transfers.    Time  5    Period  Weeks    Status  New    Target Date  08/03/18      PT SHORT TERM GOAL #3   Title  Pt will improve Functional Gait Assessment to at least 17/30 for decreased fall risk.    Time  5    Period  Weeks    Status  New    Target Date  08/03/18      PT SHORT TERM GOAL #4   Title  6MWT to be assessed, with goal to be written as appropriate.    Time  5    Period  Weeks    Status  New    Target Date  08/03/18      PT SHORT TERM GOAL #5   Title  Pt will verbalize understanding of fall prevention in home environment.    Time  5    Period  Weeks    Status  New    Target Date  08/03/18        PT Long Term Goals - 07/02/18 1355      PT LONG TERM GOAL #1   Title  Pt will verbalize plans for return to  community fitness upon d/c from PT.  TARGET 08/31/2018    Time  9    Period  Weeks    Status  New    Target Date  08/31/18      PT LONG TERM GOAL #2   Title  Pt will improve gait velocity to at least 2.62 ft/sec for decreased fall risk.    Time  9    Period  Weeks    Status  New    Target Date  08/31/18      PT LONG TERM GOAL #3   Title  Pt will improve FGA score to at least 22/30 for decreased fall risk.    Time  9    Period  Weeks    Status  New    Target Date  08/31/18      PT LONG TERM  GOAL #4   Title  Pt will ambulate at least 1000 ft, indoors and outdoors, independently, no LOB for improved gait safety and efficiency in community.    Time  9    Period  Weeks    Status  New    Target Date  08/31/18            Plan - 07/02/18 1337    Clinical Impression Statement  Pt is a 78 year old female who presents to [facility] following CVA with LLE weakness and decreased sensation, 05/31/2018.  She participated in therapies in [facility] and was discharged hoem 06/16/2018.  Prior to CVA, pt was independent.  She presents with decreased strength, decreased sensation, decreased balance, decreased safety/independence with gait.  She demonstrates decreased gait velocity (limited community ambulator) and fall risk per FGA score.  Pt was active in community and singing in choir; she would benefit from skilled PT to address the above stated deficits to decrease fall risk and improve functional mobility/return to independence.    Personal Factors and Comorbidities  Comorbidity 3+;Time since onset of injury/illness/exacerbation   CVA 05/31/2018; independent and active prior to CVA   Comorbidities  SVT, Essential HTN, vertigo (BPPV), hemilaminectomy/miscrodiscectomy 2005    Examination-Activity Limitations  Locomotion Level;Stairs;Stand;Transfers    Examination-Participation Restrictions  Church;Community Activity;Driving    Stability/Clinical Decision Making  Evolving/Moderate complexity    Clinical Decision  Making  Moderate    Rehab Potential  Good    PT Frequency  2x / week    PT Duration  Other (comment)   for 9 weeks (including eval week)   PT Treatment/Interventions  ADLs/Self Care Home Management;DME Instruction;Gait training;Stair training;Functional mobility training;Therapeutic activities;Therapeutic exercise;Electrical Stimulation;Orthotic Fit/Training;Balance training;Neuromuscular re-education;Patient/family education    PT Next Visit Plan  Review HEP given in hospital, initiate HEP for weightbearing through LLE; look at 6 MWT and write goal as appropriate; gait training and NMR to LLE    Consulted and Agree with Plan of Care  Patient;Family member/caregiver    Family Member Consulted  Husband       Patient will benefit from skilled therapeutic intervention in order to improve the following deficits and impairments:  Abnormal gait, Decreased balance, Decreased mobility, Difficulty walking, Decreased strength, Impaired tone, Postural dysfunction  Visit Diagnosis: Other abnormalities of gait and mobility  Unsteadiness on feet  Muscle weakness (generalized)     Problem List Patient Active Problem List   Diagnosis Date Noted  . Prediabetes 06/15/2018  . Palpitations 06/05/2018  . Essential hypertension 06/05/2018  . Hyperlipidemia LDL goal <70 06/05/2018  . Acute ischemic right MCA stroke ([place]) 06/05/2018  . 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 SVT (supraventricular tachycardia) ([place]) 05/28/2015  . PVC's (premature ventricular contractions) 05/28/2015  . BENIGN POSITIONAL VERTIGO 06/16/2009  . ALLERGIC RHINITIS 06/16/2009    Ryanne Morand W. 07/02/2018, 1:58 PM Frazier Butt., PT  [facility] [address] [place], [place], [address] Phone: [phone]   Fax:  [phone]  Name: Mary Mercado MRN: [id number] Date of Birth: 1941-04-11

## 2018-07-02 NOTE — Therapy (Signed)
[facility] [address] [place], [place], [address] Phone: [phone]   Fax:  [phone]  Occupational Therapy Evaluation  Patient Details  Name: Mary Mercado MRN: [id number] Date of Birth: May 16, 1940 No data recorded  Encounter Date: 07/02/2018  OT End of Session - 07/02/18 1406    Visit Number  1    Number of Visits  12    Date for OT Re-Evaluation  08/16/18    Authorization Type  MCR primary, BC/BS secondary    Authorization - Visit Number  1    Authorization - Number of Visits  10    OT Start Time  1320    OT Stop Time  1405    OT Time Calculation (min)  45 min    Activity Tolerance  Patient tolerated treatment well    Behavior During Therapy  [facility] for tasks assessed/performed       Past Medical History:  Diagnosis Date  . Allergy   . Vertigo, benign positional     Past Surgical History:  Procedure Laterality Date  . Hemilaminectomy and microdiskectomy at L4-5 on the left.  12/10/2003  . TEE WITHOUT CARDIOVERSION N/A 06/05/2018   Procedure: TRANSESOPHAGEAL ECHOCARDIOGRAM (TEE);  Surgeon: Lelon Perla, MD;  Location: [facility] ENDOSCOPY;  Service: Cardiovascular;  Laterality: N/A;  loop    There were no vitals filed for this visit.  Subjective Assessment - 07/02/18 1323    Patient is accompanied by:  Family member   Husband   Pertinent History  CVA 05/31/18. PMH: vertigo, allegies, cataract sx Lt eye    Currently in Pain?  No/denies        [facility] OT Assessment - 07/02/18 1324      Assessment   Medical Diagnosis  CVA-L weakness    Onset Date/Surgical Date  05/31/18    Hand Dominance  Right      Precautions   Precautions  Fall    Precaution Comments  No driving, loop recorder      Balance Screen   Has the patient fallen in the past 6 months  No      Home  Environment   Bathroom Astronomer;Door   shower seat   Additional Comments  Pt lives w/ husband on main level of 2  townhome. Level entry    Lives With  Spouse      Prior Function   Level of Independence  Independent    Vocation  Retired    Leisure  Enjoys singing in the choir, working out with trainer (not in the past year); enjoys going out with husband      ADL   Eating/Feeding  Needs assist with cutting food    Grooming  Modified independent    Upper Body Bathing  Modified independent    Lower Body Bathing  Modified independent    Upper Body Dressing  Minimal assistance   only to hook bra   Lower Body Dressing  Modified independent   extra time for tying shoes, fastening buttons   Leisure centre manager  Supervision/safety      IADL   Shopping  --   has not yet attempted   [employer]  --   has not yet attempted except bed making   Meal Prep  Needs to have meals prepared and served    [employer] on family or friends for transportation    Medication Management  Takes responsibility  if medication is prepared in advance in seperate dosage    Financial Management  Requires assistance      Mobility   Mobility Status  --   supervision needed     Written Expression   Dominant Hand  Right      Vision - History   Baseline Vision  Wears glasses only for reading    Additional Comments  Lt cataract sx      Vision Assessment   Ocular Range of Motion  Within Functional Limits    Visual Fields  No apparent deficits    Comment  denies changes      Sensation   Light Touch  Impaired Detail    Hot/Cold  Impaired by gross assessment    Additional Comments  intact more proximal LUE. Pt could detect stimuli in Lt palm but not w/ 2 pt discrimination, could not localize in fingertips      Coordination   9 Hole Peg Test  Right;Left    Right 9 Hole Peg Test  24.47 sec    Left 9 Hole Peg Test  52.78 sec (more drops/difficulty mostly d/t decr sensation)       Edema   Edema  none      Tone   Assessment Location  Other (comment)      Tone  Assessment - Other   Other Tone Location  none in LUE during quick stretch, however goes into tonal patterns during walking and specific tasks      ROM / Strength   AROM / PROM / Strength  AROM;Strength      AROM   Overall AROM Comments  BUE AROM WFL's      Strength   Overall Strength Comments  LUE grossly 4/5       Hand Function   Right Hand Grip (lbs)  35 lbs    Left Hand Grip (lbs)  40 lbs                        OT Short Term Goals - 07/02/18 1413      OT SHORT TERM GOAL #1   Title  Independent w/ coordination HEP    Time  3    Period  Weeks    Status  New      OT SHORT TERM GOAL #2   Title  Pt to verbalize understanding w/ safety considerations due to lack of sensation Lt hand    Time  3    Period  Weeks    Status  New      OT SHORT TERM GOAL #3   Title  Pt to return to snack prep/cold prep/microwaveable items I'ly    Time  3    Period  Weeks    Status  New      OT SHORT TERM GOAL #4   Title  Pt to consistently fold laundry and wash dishes     Time  3    Period  Weeks    Status  New        OT Long Term Goals - 07/02/18 1415      OT LONG TERM GOAL #1   Title  Independent w/ strengthening HEP for LUE shoulder    Time  6    Period  Weeks    Status  New      OT LONG TERM GOAL #2   Title  Improve coordination Lt hand as evidenced by performing 9 hole peg  test in 40 sec. or less    Baseline  52.78 sec    Time  6    Period  Weeks    Status  New      OT LONG TERM GOAL #3   Title  Pt to return to cooking w/ distant sup and A/E prn for safety of Lt hand    Time  6    Period  Weeks    Status  New      OT LONG TERM GOAL #4   Title  Pt to return to cleaning tasks at mod I level    Time  6    Period  Weeks    Status  New            Plan - 07/02/18 1409    Clinical Impression Statement  Pt is a 78 y.o. female who presents to outpatient rehab s/p Rt MCA CVA on 05/31/18. Pt w/ mild Lt hemiparesis, decreased coordination and decreased  sensation Lt hand, mild LUE weakness, and unsteadiness on feet.     OT Occupational Profile and History  Problem Focused Assessment - Including review of records relating to presenting problem    Occupational performance deficits (Please refer to evaluation for details):  ADL's;IADL's    Body Structure / Function / Physical Skills  ADL;UE functional use;FMC;Decreased knowledge of use of DME;Mobility;Sensation;Vision;Strength;Endurance;Coordination;IADL;Tone    Rehab Potential  Good    Clinical Decision Making  Limited treatment options, no task modification necessary    Comorbidities Affecting Occupational Performance:  May have comorbidities impacting occupational performance    Modification or Assistance to Complete Evaluation   No modification of tasks or assist necessary to complete eval    OT Frequency  2x / week    OT Duration  6 weeks    OT Treatment/Interventions  Self-care/ADL training;Therapeutic exercise;Moist Heat;Neuromuscular education;Patient/family education;Visual/perceptual remediation/compensation;Therapist, nutritional;Therapeutic activities;DME and/or AE instruction;Manual Therapy;Passive range of motion;Cognitive remediation/compensation    Plan  coordination HEP, safety considerations w/ Lt hand d/t decreased sensation    Consulted and Agree with Plan of Care  Patient;Family member/caregiver    Family Member Consulted  husband       Patient will benefit from skilled therapeutic intervention in order to improve the following deficits and impairments:  Body Structure / Function / Physical Skills  Visit Diagnosis: Other lack of coordination - Plan: Ot plan of care cert/re-cert  Muscle weakness (generalized) - Plan: Ot plan of care cert/re-cert  Unsteadiness on feet - Plan: Ot plan of care cert/re-cert  Other disturbances of skin sensation - Plan: Ot plan of care cert/re-cert    Problem List Patient Active Problem List   Diagnosis Date Noted  . Prediabetes  06/15/2018  . Palpitations 06/05/2018  . Essential hypertension 06/05/2018  . Hyperlipidemia LDL goal <70 06/05/2018  . Acute ischemic right MCA stroke ([place]) 06/05/2018  . 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 SVT (supraventricular tachycardia) ([place]) 05/28/2015  . PVC's (premature ventricular contractions) 05/28/2015  . BENIGN POSITIONAL VERTIGO 06/16/2009  . ALLERGIC RHINITIS 06/16/2009    Carey Bullocks, OTR/L 07/02/2018, 2:19 PM  [facility] [address] [place], [place], [address] Phone: [phone]   Fax:  [phone]  Name: Mary Mercado MRN: [id number] Date of Birth: 05-14-1940

## 2018-07-04 ENCOUNTER — Ambulatory Visit (INDEPENDENT_AMBULATORY_CARE_PROVIDER_SITE_OTHER): Payer: Medicare Other | Admitting: Physician Assistant

## 2018-07-04 ENCOUNTER — Other Ambulatory Visit: Payer: Self-pay

## 2018-07-04 ENCOUNTER — Encounter: Payer: Self-pay | Admitting: Physician Assistant

## 2018-07-04 ENCOUNTER — Ambulatory Visit: Payer: Medicare Other | Admitting: Cardiology

## 2018-07-04 VITALS — BP 126/74 | HR 78 | Ht 65.0 in | Wt 138.8 lb

## 2018-07-04 DIAGNOSIS — I63511 Cerebral infarction due to unspecified occlusion or stenosis of right middle cerebral artery: Secondary | ICD-10-CM | POA: Diagnosis not present

## 2018-07-04 DIAGNOSIS — I499 Cardiac arrhythmia, unspecified: Secondary | ICD-10-CM | POA: Diagnosis not present

## 2018-07-04 DIAGNOSIS — I493 Ventricular premature depolarization: Secondary | ICD-10-CM | POA: Diagnosis not present

## 2018-07-04 DIAGNOSIS — I491 Atrial premature depolarization: Secondary | ICD-10-CM | POA: Diagnosis not present

## 2018-07-04 DIAGNOSIS — I10 Essential (primary) hypertension: Secondary | ICD-10-CM | POA: Diagnosis not present

## 2018-07-04 NOTE — Patient Instructions (Signed)
Medication Instructions:  Your physician recommends that you continue on your current medications as directed. Please refer to the Current Medication list given to you today.  If you need a refill on your cardiac medications before your next appointment, please call your pharmacy.   Lab work: NONE If you have labs (blood work) drawn today and your tests are completely normal, you will receive your results only by: Marland Kitchen MyChart Message (if you have MyChart) OR . A paper copy in the mail If you have any lab test that is abnormal or we need to change your treatment, we will call you to review the results.  Testing/Procedures: NONE  Follow-Up: At [facility], you and your health needs are our priority.  As part of our continuing mission to provide you with exceptional heart care, we have created designated Provider Care Teams.  These Care Teams include your primary Cardiologist (physician) and Advanced Practice Providers (APPs -  Physician Assistants and Nurse Practitioners) who all work together to provide you with the care you need, when you need it. You will need a follow up appointment in:  2-3 months.  Please call our office 2 months in advance to schedule this appointment.  You may see DR. NAHSER or one of the following Advanced Practice Providers on your designated Care Team: Richardson Dopp, PA-C [place], [place] . Daune Perch, NP  Any Other Special Instructions Will Be Listed Below (If Applicable).

## 2018-07-04 NOTE — Progress Notes (Signed)
Cardiology Office Note    Date:  07/04/2018   ID:  Mary Mercado, DOB [birth year], MRN [id number]  PCP:  [name], MD  Cardiologist:  Dr. Acie Fredrickson  Chief Complaint: Hospital follow up   History of Present Illness:   Mary Mercado is a 78 y.o. female with hx of HTN, HLD, palpitation and recent stroke presents for follow up.   She was evaluated by Dr. Acie Fredrickson in 2017 for a single episode of palpitations (describeds as fast and irregular), self resolved, his note mentions SVT and recommended vagal maneuver and propanolol PRN, also mentioned PVCs that were likely benign.  Mary Mercado was admitted on 05/31/2018 with stroke.  They first developed symptoms while at home, she did receive tPA Imaging demonstrated right MCA infarcts, embolic pattern, source unclear. Echo showed normal LVEF, grade 2 DD. Worsening of palpitations in past 6 months. Seen by EP for ILR vs monitor given increased frequently of palpitation. After discussion, plan for 30 days event monitor.   Here today for follow up. Completed inpatient rehab. Doing well. Complains of R hand numbness since this morning. No slurred speech. Denies chest pain, dyspnea, LE edema, palpitations or melena. Wearing monitor since last week.  No episode of palpitations. During my evaluation, patient suddenly felt flushed.   Past Medical History:  Diagnosis Date  . Allergy   . CVA (cerebral vascular accident) ([place])   . Hyperlipidemia LDL goal <70   . Hypertension   . Palpitations   . Vertigo, benign positional     Past Surgical History:  Procedure Laterality Date  . Hemilaminectomy and microdiskectomy at L4-5 on the left.  12/10/2003  . TEE WITHOUT CARDIOVERSION N/A 06/05/2018   Procedure: TRANSESOPHAGEAL ECHOCARDIOGRAM (TEE);  Surgeon: Lelon Perla, MD;  Location: [facility] ENDOSCOPY;  Service: Cardiovascular;  Laterality: N/A;  loop    Current Medications: Prior to Admission medications   Medication Sig Start Date End Date  Taking? Authorizing Provider  aspirin 81 MG tablet Take 1 tablet (81 mg total) by mouth daily. Stop taking aspirin after 2/28. Patient not taking: Reported on 07/02/2018 06/15/18   Love, Ivan Anchors, PA-C  atorvastatin (LIPITOR) 40 MG tablet Take 1 tablet (40 mg total) by mouth daily at 6 PM. 06/19/18   [name], MD  clopidogrel (PLAVIX) 75 MG tablet Take 1 tablet (75 mg total) by mouth daily. 06/19/18   [name], MD  pantoprazole (PROTONIX) 40 MG tablet Take 1 tablet (40 mg total) by mouth daily. 06/19/18   [name], MD    Allergies:   Acetaminophen   Social History   Socioeconomic History  . Marital status: Married    Spouse name: Not on file  . Number of children: Not on file  . Years of education: Not on file  . Highest education level: Not on file  Occupational History  . Not on file  Social Needs  . Financial resource strain: Not on file  . Food insecurity:    Worry: Not on file    Inability: Not on file  . Transportation needs:    Medical: Not on file    Non-medical: Not on file  Tobacco Use  . Smoking status: Never Smoker  . Smokeless tobacco: Never Used  Substance and Sexual Activity  . Alcohol use: No  . Drug use: No  . Sexual activity: Not on file  Lifestyle  . Physical activity:    Days per week: Not on file    Minutes per  session: Not on file  . Stress: Not on file  Relationships  . Social connections:    Talks on phone: Not on file    Gets together: Not on file    Attends religious service: Not on file    Active member of club or organization: Not on file    Attends meetings of clubs or organizations: Not on file    Relationship status: Not on file  Other Topics Concern  . Not on file  Social History Narrative  . Not on file     Family History:  The patient's family history includes Colon cancer in her father; Diabetes in her mother; Heart attack in her mother; Heart attack (age of onset: 73) in her brother.  ROS:   Please see the history  of present illness.    ROS All other systems reviewed and are negative.   PHYSICAL EXAM:   VS:  BP 126/74   Pulse 78   Ht 5\' 5"  (1.651 m)   Wt 138 lb 12.8 oz (63 kg)   SpO2 99%   BMI 23.10 kg/m    GEN: Well nourished, well developed, in no acute distress  HEENT: normal  Neck: no JVD, carotid bruits, or masses Cardiac: Irregular; no murmurs, rubs, or gallops,no edema  Respiratory:  clear to auscultation bilaterally, normal work of breathing GI: soft, nontender, nondistended, + BS MS: no deformity or atrophy  Skin: warm and dry, no rash Neuro:  Alert and Oriented x 3, Strength and sensation are intact Psych: euthymic mood, full affect  Wt Readings from Last 3 Encounters:  07/04/18 138 lb 12.8 oz (63 kg)  06/28/18 132 lb (59.9 kg)  06/19/18 134 lb 2 oz (60.8 kg)      Studies/Labs Reviewed:   EKG:  EKG is ordered today.  The ekg ordered today demonstrates SR with PAC and PVCs  Recent Labs: 06/06/2018: ALT 24; BUN 13; Creatinine, Ser 0.67; Hemoglobin 12.6; Platelets 224; Potassium 3.9; Sodium 140   Lipid Panel    Component Value Date/Time   CHOL 243 (H) 06/01/2018 0548   TRIG 80 06/01/2018 0548   HDL 110 06/01/2018 0548   CHOLHDL 2.2 06/01/2018 0548   VLDL 16 06/01/2018 0548   LDLCALC 117 (H) 06/01/2018 0548    Additional studies/ records that were reviewed today include:    TRANSESOPHOGEAL ECHO  06/05/2018 1. The left ventricle has normal systolic function of 77-82%. The cavity size was normal. No evidence of left ventricular regional wall motion abnormalities.  2. No evidence of left ventricular regional wall motion abnormalities.  3. The right ventricle has normal systolic function. The cavity was normal.  4. Left atrial size was severely dilated.  5. Right atrial size was severely dilated.  6. Trivial pericardial effusion.  7. The mitral valve is normal in structure. There is mild thickening.  8. Moderate tricuspid valve prolapse.  9. 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 10. The aortic valve is tricuspid There is mild thickening of the aortic valve. 11. The pulmonic valve was grossly normal. Pulmonic valve regurgitation is mild by color flow Doppler. 12. Normal LV function; severe LAE; no LAA thrombus; severe RAE; sclerotic aortic valve with mild AI; mild MR; TV prolapse with moderate to severe TR; negative saline microcavitation study.    ASSESSMENT & PLAN:    1. Palpitations/CVA - Long standing hx of palpitations, recently worsen. Unknown etiology of stroke. Wearing monitor since last week, no episode of palpitations. Felt  flushed during my evaluation. Stat EKG showed sinus rhythm with PVCs and PACs. Advised to continue to monitor symptoms. Continue Plavix. She avoids caffeine.    Medication Adjustments/Labs and Tests Ordered: Current medicines are reviewed at length with the patient today.  Concerns regarding medicines are outlined above.  Medication changes, Labs and Tests ordered today are listed in the Patient Instructions below. Patient Instructions  Medication Instructions:  Your physician recommends that you continue on your current medications as directed. Please refer to the Current Medication list given to you today.  If you need a refill on your cardiac medications before your next appointment, please call your pharmacy.   Lab work: NONE If you have labs (blood work) drawn today and your tests are completely normal, you will receive your results only by: Marland Kitchen MyChart Message (if you have MyChart) OR . A paper copy in the mail If you have any lab test that is abnormal or we need to change your treatment, we will call you to review the results.  Testing/Procedures: NONE  Follow-Up: At [facility], you and your health needs are our priority.  As part of our continuing mission to provide you with exceptional heart care, we have created designated Provider Care Teams.  These Care  Teams include your primary Cardiologist (physician) and Advanced Practice Providers (APPs -  Physician Assistants and Nurse Practitioners) who all work together to provide you with the care you need, when you need it. You will need a follow up appointment in:  2-3 months.  Please call our office 2 months in advance to schedule this appointment.  You may see DR. NAHSER or one of the following Advanced Practice Providers on your designated Care Team: Richardson Dopp, PA-C [place], [place] . Daune Perch, NP  Any Other Special Instructions Will Be Listed Below (If Applicable).       Jarrett Soho, [place]  07/04/2018 2:46 PM    [facility] [address], [place], [place]  [address] Phone: [phone]; Fax: [phone]

## 2018-07-05 ENCOUNTER — Telehealth: Payer: Self-pay | Admitting: *Deleted

## 2018-07-05 ENCOUNTER — Ambulatory Visit: Payer: Medicare Other | Admitting: Occupational Therapy

## 2018-07-05 MED ORDER — APIXABAN 5 MG PO TABS: 5.0000 mg | ORAL_TABLET | Freq: Two times a day (BID) | ORAL | 2 refills | Status: DC

## 2018-07-05 NOTE — Telephone Encounter (Addendum)
Spoke with pt re critical reading from [employer]  A flutter at rate of 120 with PVC's per pt  No symptoms. Discussed with Robbie Lis PA and Dr Tamala Julian  Pt to stop Plavix and start Eliquis 5 mg bid Pt and pt's husband aware of recommendations and med changes and agree with tx plan ./cy

## 2018-07-06 ENCOUNTER — Ambulatory Visit: Payer: Medicare Other | Admitting: Physical Therapy

## 2018-07-08 NOTE — Telephone Encounter (Signed)
Agree with starting Eliquis for her atrial flutter and hx of stroke Follow up with me or APP in the next month or so

## 2018-07-09 ENCOUNTER — Ambulatory Visit: Payer: Medicare Other | Admitting: Physical Therapy

## 2018-07-09 ENCOUNTER — Telehealth: Payer: Self-pay | Admitting: Nurse Practitioner

## 2018-07-09 ENCOUNTER — Ambulatory Visit: Payer: Medicare Other | Admitting: Occupational Therapy

## 2018-07-09 MED ORDER — METOPROLOL SUCCINATE ER 25 MG PO TB24: 25.0000 mg | ORAL_TABLET | Freq: Every day | ORAL | 11 refills | Status: DC

## 2018-07-09 NOTE — Telephone Encounter (Signed)
Called patient regarding a monitor report that was received today showing atrial flutter at a rate of 170 bpm on 3/16 at 9:56 am today. Patient states she was having difficulty with getting the monitor to read around that time; she denies complaints. She asked me to talk with her husband and handed him the phone. I reviewed the information with husband and advised that Dr. Acie Fredrickson would like patient to start Toprol XL 25 mg daily; he verbalized agreement.  I advised him to call back if patient has questions or concerns prior to April appointment and he thanked me for the call.

## 2018-07-11 ENCOUNTER — Ambulatory Visit: Payer: Medicare Other | Admitting: Occupational Therapy

## 2018-07-13 ENCOUNTER — Ambulatory Visit: Payer: Medicare Other | Admitting: Physical Therapy

## 2018-07-13 ENCOUNTER — Telehealth: Payer: Self-pay | Admitting: Cardiovascular Disease

## 2018-07-13 NOTE — Telephone Encounter (Signed)
Call came in from [place] with [employer].  Pt noted to be in Afib with RVR this morning at 8:08A (CST) with a rate of 160-170.  Spoke with pt and she states that is when they were changing the patch out.  She states this also happened the last time she changed the patch.  Denies feeling palps at that time.  Denies lightheadedness or dizziness.  Advised pt to continue current meds and call if any changes.  Advised I will speak with DOD and call back if any further recommendations.  Spoke with Dr. Curt Bears, DOD, no changes, continue to monitor.

## 2018-07-16 ENCOUNTER — Telehealth: Payer: Self-pay | Admitting: Physician Assistant

## 2018-07-16 ENCOUNTER — Ambulatory Visit: Payer: Medicare Other | Admitting: Occupational Therapy

## 2018-07-16 NOTE — Telephone Encounter (Signed)
° °  [employer] calling with abnormal EKG report  Caller hung up before call could be completed

## 2018-07-17 ENCOUNTER — Telehealth: Payer: Self-pay | Admitting: Physical Therapy

## 2018-07-17 NOTE — Telephone Encounter (Signed)
Reviewed [employer] report with the patient on 07/16/18, she was asymptomatic and stated she was eating at the time of the event around 10:30 am and at 8 pm, she was on the couch. Spoke with Dr. Acie Fredrickson, he stated the rhythm is just artifact.

## 2018-07-17 NOTE — Telephone Encounter (Signed)
Mrs. Matlack was contacted today regarding the temporary closing of [facility] Rehab Services due to Covid-19.  Therapist discussed:  Importance of continuing HEP (initiated in hospital).  Pt has no additional PT/OT related questions at this time.  Patient is potentially interested in further information for an e-visit, virtual check in, or telehealth visit, if those services become available.    [facility] will follow up with patients when we are able to resume care.  Mady Haagensen, [place] [address] [place], [place]  [address] Phone:  [phone] Fax:  [phone] \

## 2018-07-18 ENCOUNTER — Ambulatory Visit: Payer: Medicare Other | Admitting: Occupational Therapy

## 2018-07-18 ENCOUNTER — Telehealth: Payer: Self-pay

## 2018-07-18 ENCOUNTER — Ambulatory Visit: Payer: Medicare Other | Admitting: Physical Therapy

## 2018-07-18 NOTE — Telephone Encounter (Signed)
[employer] called, stating she had afib with RVR at rate of 170 and 3 beats of Vtach at 8:40 am. The patient stated she was changing her patch at that time. She had no other concerns.

## 2018-07-20 ENCOUNTER — Ambulatory Visit: Payer: Medicare Other | Admitting: Physical Therapy

## 2018-07-20 ENCOUNTER — Telehealth: Payer: Self-pay

## 2018-07-20 MED ORDER — METOPROLOL SUCCINATE ER 50 MG PO TB24: 50.0000 mg | ORAL_TABLET | Freq: Every day | ORAL | 3 refills | Status: DC

## 2018-07-20 NOTE — Telephone Encounter (Signed)
Received monitor results from [employer]... pt had 3 beat run of VT and a run of Atrial Flutter with Rate of 130 at 1:18am... pt reports that she had used the bathroom at that time but denies chest pain, dizziness, sob.. says she did not notice anything abnormal... advised pt to continue her meds as prescribed and will call if Dr. Acie Fredrickson would like to make any changes..   Pt also reporting that her ankles are mildly swollen.. she denies sob, can still wear her normal shoes... she says that she had recently been eating country ham... I advised her to be careful and watch her sodium intake, to elevate her legs when sitting, and pt says she has compression stockings she will try to wear.. she will also cut back on the ham. Will call back if she has any worsening symptoms.

## 2018-07-20 NOTE — Telephone Encounter (Signed)
After talking with Dr. Acie Fredrickson, pt advised to increase her Toprol XL to 50 mg a day... pt to call mid next week to let us know how she is feeling.

## 2018-07-23 ENCOUNTER — Encounter: Payer: Medicare Other | Admitting: Occupational Therapy

## 2018-07-25 ENCOUNTER — Ambulatory Visit: Payer: Medicare Other | Admitting: Physical Therapy

## 2018-07-25 ENCOUNTER — Encounter: Payer: Medicare Other | Admitting: Occupational Therapy

## 2018-07-26 ENCOUNTER — Inpatient Hospital Stay: Payer: Medicare Other | Admitting: Adult Health

## 2018-07-27 ENCOUNTER — Ambulatory Visit: Payer: Medicare Other | Admitting: Physical Therapy

## 2018-07-30 ENCOUNTER — Ambulatory Visit: Payer: Medicare Other | Admitting: Physical Therapy

## 2018-07-30 ENCOUNTER — Other Ambulatory Visit: Payer: Self-pay | Admitting: Physician Assistant

## 2018-07-31 ENCOUNTER — Encounter: Payer: Self-pay | Admitting: Physical Medicine & Rehabilitation

## 2018-07-31 ENCOUNTER — Ambulatory Visit (HOSPITAL_BASED_OUTPATIENT_CLINIC_OR_DEPARTMENT_OTHER): Payer: Medicare Other | Admitting: Physical Medicine & Rehabilitation

## 2018-07-31 ENCOUNTER — Encounter: Payer: Medicare Other | Attending: Physical Medicine & Rehabilitation

## 2018-07-31 ENCOUNTER — Other Ambulatory Visit: Payer: Self-pay

## 2018-07-31 VITALS — BP 139/99 | HR 90 | Ht 65.0 in | Wt 150.0 lb

## 2018-07-31 DIAGNOSIS — E785 Hyperlipidemia, unspecified: Secondary | ICD-10-CM | POA: Insufficient documentation

## 2018-07-31 DIAGNOSIS — I63511 Cerebral infarction due to unspecified occlusion or stenosis of right middle cerebral artery: Secondary | ICD-10-CM

## 2018-07-31 NOTE — Progress Notes (Signed)
Subjective:    Patient ID: Mary Mercado, female    DOB: [birth year], 78 y.o.   MRN: [id number]  HPI Right post MCA infarct in May 31, 2018, right frontal parietal infarct. CIR admit Home with out pt PT, OT f/u but only attended one session before Covid restrictions shut it down Overall the patient is functioning at a modified independent level.  She would like to resume driving. Pain Inventory Average Pain 0 Pain Right Now 0 My pain is no pain  In the last 24 hours, has pain interfered with the following? General activity 0 Relation with others 0 Enjoyment of life 0 What TIME of day is your pain at its worst? no pain Sleep (in general) Fair  Pain is worse with: no pain Pain improves with: no pain Relief from Meds: no pain  Mobility walk without assistance how many minutes can you walk? 10 ability to climb steps?  no do you drive?  no  Function retired I need assistance with the following:  shopping  Neuro/Psych weakness numbness tremor  Prior Studies Any changes since last visit?  no  Physicians involved in your care Any changes since last visit?  no   Family History  Problem Relation Age of Onset  . Colon cancer Father   . Diabetes Mother   . Heart attack Mother        30  . Heart attack Brother 34   Social History   Socioeconomic History  . Marital status: Married    Spouse name: Not on file  . Number of children: Not on file  . Years of education: Not on file  . Highest education level: Not on file  Occupational History  . Not on file  Social Needs  . Financial resource strain: Not on file  . Food insecurity:    Worry: Not on file    Inability: Not on file  . Transportation needs:    Medical: Not on file    Non-medical: Not on file  Tobacco Use  . Smoking status: Never Smoker  . Smokeless tobacco: Never Used  Substance and Sexual Activity  . Alcohol use: No  . Drug use: No  . Sexual activity: Not on file  Lifestyle  .  Physical activity:    Days per week: Not on file    Minutes per session: Not on file  . Stress: Not on file  Relationships  . Social connections:    Talks on phone: Not on file    Gets together: Not on file    Attends religious service: Not on file    Active member of club or organization: Not on file    Attends meetings of clubs or organizations: Not on file    Relationship status: Not on file  Other Topics Concern  . Not on file  Social History Narrative  . Not on file   Past Surgical History:  Procedure Laterality Date  . Hemilaminectomy and microdiskectomy at L4-5 on the left.  12/10/2003  . TEE WITHOUT CARDIOVERSION N/A 06/05/2018   Procedure: TRANSESOPHAGEAL ECHOCARDIOGRAM (TEE);  Surgeon: Lelon Perla, MD;  Location: [facility] ENDOSCOPY;  Service: Cardiovascular;  Laterality: N/A;  loop   Past Medical History:  Diagnosis Date  . Allergy   . CVA (cerebral vascular accident) ([place])   . Hyperlipidemia LDL goal <70   . Hypertension   . Palpitations   . Vertigo, benign positional    BP (!) 139/99   Pulse 90   Ht 5'  5" (1.651 m)   Wt 150 lb (68 kg)   SpO2 96%   BMI 24.96 kg/m   Opioid Risk Score:   Fall Risk Score:  `1  Depression screen PHQ 2/9  Depression screen PHQ 2/9 06/14/2017  Decreased Interest 0  Down, Depressed, Hopeless 0  PHQ - 2 Score 0     Review of Systems  Constitutional: Negative.   HENT: Negative.   Eyes: Negative.   Respiratory: Negative.   Cardiovascular: Negative.   Gastrointestinal: Negative.   Endocrine: Negative.   Genitourinary: Negative.   Musculoskeletal: Negative.   Skin: Negative.   Allergic/Immunologic: Negative.   Neurological: Positive for tremors and numbness.       Feeling jittery  Hematological: Negative.   Psychiatric/Behavioral: Negative.   All other systems reviewed and are negative.      Objective:   Physical Exam  Motor strength is 5/5 bilateral deltoid bicep tricep grip hip flexor knee extensor ankle  dorsiflexor Her sensation is reported is equal bilateral upper and lower limbs Visual fields are intact confrontation testing Mood and affect are appropriate, she does get anxious at times but is even able to calm herself with some deep breaths Speech without dysarthria or aphasia. She has negative Romberg test She ambulates without assistive device no evidence of toe drag or knee instability.  She has some difficulty with tandem gait.     Assessment & Plan:  1.  History of CVA, right frontal parietal MCA distribution with no significant residual deficit other than minimal balance issues. We discussed that she has had an excellent recovery.  We discussed that she should keep up with exercise We also discussed that she should be okay for gradually resuming driving Graduated return to driving instructions were provided. It is recommended that the patient first drives with another licensed driver in an empty parking lot. If the patient does well with this, and they can drive on a quiet street with the licensed driver. If the patient does well with this they can drive on a busy street with a licensed driver. If the patient does well with this, the next time out they can go by himself. For the first month after resuming driving, I recommend no nighttime or Interstate driving.   2.  Patient also exhibiting some anxiety at times we discussed that none of her medications are likely to cause this.  We discussed deep breathing relaxation exercises.  She seemed open to this approach.  Would avoid benzodiazepines given fall risk due to age and CVA as well as being on Eliquis.  Physical medicine rehab follow-up on as-needed basis.

## 2018-07-31 NOTE — Patient Instructions (Addendum)
Rec  Standing on a pillow to practice balance   Recommend Deep breathing exercise in a seated position with eyes half closed in a quiet room Breath in through the nose and out through the mouth 100 breaths per day  Graduated return to driving instructions were provided. It is recommended that the patient first drives with another licensed driver in an empty parking lot. If the patient does well with this, and they can drive on a quiet street with the licensed driver. If the patient does well with this they can drive on a busy street with a licensed driver. If the patient does well with this, the next time out they can go by himself. For the first month after resuming driving, I recommend no nighttime or Interstate driving.

## 2018-08-01 ENCOUNTER — Ambulatory Visit: Payer: Medicare Other | Admitting: Physical Therapy

## 2018-08-02 ENCOUNTER — Telehealth: Payer: Self-pay

## 2018-08-02 NOTE — Telephone Encounter (Signed)
Entry from 07/25/2018.  PT was called by RN .Pt had hard time downloading webex on her I phone. Nurse spent 20 min trying to troubleshoot and help her along with her husband. Pt can face time with her Iphone. Dr.Sethi agreed to do facetime.

## 2018-08-06 ENCOUNTER — Ambulatory Visit: Payer: Medicare Other | Admitting: Physical Therapy

## 2018-08-06 ENCOUNTER — Encounter: Payer: Medicare Other | Admitting: Occupational Therapy

## 2018-08-06 ENCOUNTER — Telehealth: Payer: Self-pay | Admitting: Cardiovascular Disease

## 2018-08-06 MED ORDER — DILTIAZEM HCL ER COATED BEADS 180 MG PO CP24: 180.0000 mg | ORAL_CAPSULE | Freq: Every day | ORAL | 11 refills | Status: DC

## 2018-08-06 NOTE — Telephone Encounter (Signed)
Pt has developed AF ( by the event monitor) HR was 90 at rehab last week BP was 130s   Will add Diltiazem 180 CD to her medications [facility] [address]  She will get an [employer] BP cuff and record her BP readings.   She will need a cardioversion as soon as the Covid 87 outbreak is under control Anticipate having her come in for an office visit in 4-6 weeks to arrange cardioversion ( appt with PA )     Mertie Moores, MD  08/06/2018 5:14 PM    [facility] [place],  [place], [place]  [address] Pager [phone] Phone: [phone]; Fax: [phone]

## 2018-08-06 NOTE — Addendum Note (Signed)
Addended by: Emmaline Life on: 08/06/2018 05:26 PM   Modules accepted: Orders

## 2018-08-13 ENCOUNTER — Telehealth: Payer: Self-pay | Admitting: Physician Assistant

## 2018-08-13 ENCOUNTER — Ambulatory Visit (INDEPENDENT_AMBULATORY_CARE_PROVIDER_SITE_OTHER): Payer: Medicare Other | Admitting: Neurology

## 2018-08-13 ENCOUNTER — Other Ambulatory Visit: Payer: Self-pay

## 2018-08-13 ENCOUNTER — Telehealth: Payer: Self-pay | Admitting: Cardiovascular Disease

## 2018-08-13 ENCOUNTER — Ambulatory Visit: Payer: Medicare Other | Admitting: Physical Therapy

## 2018-08-13 DIAGNOSIS — I480 Paroxysmal atrial fibrillation: Secondary | ICD-10-CM | POA: Diagnosis not present

## 2018-08-13 DIAGNOSIS — I63411 Cerebral infarction due to embolism of right middle cerebral artery: Secondary | ICD-10-CM | POA: Diagnosis not present

## 2018-08-13 DIAGNOSIS — I63511 Cerebral infarction due to unspecified occlusion or stenosis of right middle cerebral artery: Secondary | ICD-10-CM | POA: Diagnosis not present

## 2018-08-13 DIAGNOSIS — I4891 Unspecified atrial fibrillation: Secondary | ICD-10-CM | POA: Insufficient documentation

## 2018-08-13 NOTE — Progress Notes (Signed)
Virtual Visit via Video Note  I connected with Mary Mercado on 08/13/18 at 12:00 PM EDT by a video enabled telemedicine application and verified that I am speaking with the correct person using two identifiers.  The patient was in her home with her husband and used her cell phone for face time video.  I was in my office   I discussed the limitations of evaluation and management by telemedicine and the availability of in person appointments. The patient expressed understanding and agreed to proceed.  History of Present Illness: This is a initial video virtual consultation visit on Mary Mercado who was admitted to [facility] in February 2020 with a stroke.  I have personally obtained history of presenting illness from the patient and her husband and reviewed electronic medical records as well as imaging films in PACS.  She was admitted on 05/31/2018 with sudden onset of left arm and leg weakness and numbness as well as feeling dizzy and had one episode of emesis.  She had CT scan and CT angiogram in the ER which showed no large vessel occlusion she was given IV TPA and admitted to the neurological intensive care unit.  NIH stroke scale on admission was 4.  At baseline modified Rankin score was 0.  Patient had tight blood pressure control.  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  Transthoracic echo showed normal ejection fraction.  Transesophageal echocardiogram showed no cardiac source of embolism or PFO.  LDL cholesterol was elevated at 1 1 7  mg percent.  Hemoglobin A1c was 5.9.  Urine drug screen was negative.  Lower extremity venous Dopplers were negative for DVT.  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  Patient was started  on aspirin 81 and Plavix 75 mg daily for 3 weeks and underwent an outpatient 30-day heart monitor which subsequently showed paroxysmal atrial fibrillation.  She has since then stopped aspirin and Plavix and has been switched to Eliquis.  Patient states she has done well since discharge.  She was initially transferred to inpatient rehab where she stayed for a few weeks and did well.  She is still has left-sided numbness but feels some of the sensation may be coming back now.  She can walk independently without assistance though her husband stays close by.  She has had no falls or injuries.  She is tolerating Eliquis well without bleeding or bruising.  She is also tolerating Lipitor well.  She complains of little bit of jitteriness and tiredness but she blames this likely on her new A. fib medication.  She has no new complaints.  She has no prior history of strokes TIAs.    Observations/Objective: Neurological exam limited due to constraints of video visit.  She is pleasant awake alert cooperative.  No aphasia apraxia or dysarthria.  She has intact attention registration recall.  Extraocular movements are full range without nystagmus.  Face is symmetric without weakness.  Tongue midline.  Motor system exam shows no upper extremity drift but slightly diminished fine finger movements on the left.  She has some subjective decrease sensation in the left arm and leg compared to the right side.  She is able to stand on either foot unsupported holding onto a chair.  Her gait appears to be steady. 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  Vascular risk factors of hypertension, hyperlipidemia, A. fib and age.  She is doing well with only mild residual left-sided sensory loss. Follow Up Instructions: Recommend patient continue Eliquis for stroke prevention for atrial fibrillation and maintain strict  control of hypertension with blood pressure goal below 130/90, lipids with LDL cholesterol goal below 70 mg percent and diabetes with hemoglobin A1c goal below 6.5%.  She was also encouraged to eat a healthy diet with lots of fruits, vegetables, cereals and whole grains and to be active and exercise 30 minutes every day.  She was advised to return for follow-up in 3 months or call earlier if necessary.    I discussed the assessment and treatment plan with the patient. The patient was provided an opportunity to ask questions and all were answered. The patient agreed with the plan and demonstrated an understanding of the instructions.   The patient was advised to call back or seek an in-person evaluation if the symptoms worsen or if the condition fails to improve as anticipated.  I provided 30 minutes of non-face-to-face time during this encounter.   Antony Contras, MD

## 2018-08-13 NOTE — Telephone Encounter (Signed)
° °  Please call patient back regarding BP readings.

## 2018-08-13 NOTE — Telephone Encounter (Signed)
4/20 :   VIDEO Doximity visit on 08/20/2018. Patient has smartphone.     Phone Call to obtain consent  -  08/13/2018         Virtual Visit Pre-Appointment Phone Call  Steps For Call:  1. Confirm consent - "In the setting of the current Covid19 crisis, you are scheduled for a (phone or video) visit with your provider on (date) at (time).  Just as we do with many in-office visits, in order for you to participate in this visit, we must obtain consent.  If you'd like, I can send this to your mychart (if signed up) or email for you to review.  Otherwise, I can obtain your verbal consent now.  All virtual visits are billed to your insurance company just like a normal visit would be.  By agreeing to a virtual visit, we'd like you to understand that the technology does not allow for your provider to perform an examination, and thus may limit your provider's ability to fully assess your condition. If your provider identifies any concerns that need to be evaluated in person, we will make arrangements to do so.  Finally, though the technology is pretty good, we cannot assure that it will always work on either your or our end, and in the setting of a video visit, we may have to convert it to a phone-only visit.  In either situation, we cannot ensure that we have a secure connection.  Are you willing to proceed?" STAFF: Did the patient verbally acknowledge consent to telehealth visit? Document YES/NO here: YES  2. Confirm the BEST phone number to call the day of the visit by including in appointment notes  3. Give patient instructions for WebEx/MyChart download to smartphone as below or Doximity/Doxy.me if video visit (depending on what platform provider is using)  4. Advise patient to be prepared with their blood pressure, heart rate, weight, any heart rhythm information, their current medicines, and a piece of paper and pen handy for any instructions they may receive the day of their visit  5. Inform  patient they will receive a phone call 15 minutes prior to their appointment time (may be from unknown caller ID) so they should be prepared to answer  6. Confirm that appointment type is correct in Epic appointment notes (VIDEO vs PHONE)     TELEPHONE CALL NOTE  Semaya Vida has been deemed a candidate for a follow-up tele-health visit to limit community exposure during the Covid-19 pandemic. I spoke with the patient via phone to ensure availability of phone/video source, confirm preferred email & phone number, and discuss instructions and expectations.  I reminded Tashira Torre to be prepared with any vital sign and/or heart rhythm information that could potentially be obtained via home monitoring, at the time of her visit. I reminded Ena Demary to expect a phone call at the time of her visit if her visit.  Thayer Headings 08/13/2018 1:53 PM   INSTRUCTIONS FOR DOWNLOADING THE [place] APP TO SMARTPHONE  - If Apple, ask patient to go to App Store and type in WebEx in the search bar. [place] [employer], the blue/green circle. If Android, go to [employer] and type in [employer] in the search bar. The app is free but as with any other app downloads, their phone may require them to verify saved payment information or Apple/Android password.  - The patient does NOT have to create an account. - On the day of the visit, the assist will  walk the patient through joining the meeting with the meeting number/password.  INSTRUCTIONS FOR DOWNLOADING THE MYCHART APP TO SMARTPHONE  - The patient must first make sure to have activated MyChart and know their login information - If Apple, go to [employer] and type in MyChart in the search bar and download the app. If Android, ask patient to go to [employer] and type in [place] in the search bar and download the app. The app is free but as with any other app downloads, their phone may require them to verify saved payment information or  Apple/Android password.  - The patient will need to then log into the app with their MyChart username and password, and select [facility] as their healthcare provider to link the account. When it is time for your visit, go to the MyChart app, find appointments, and click Begin Video Visit. Be sure to Select Allow for your device to access the Microphone and Camera for your visit. You will then be connected, and your provider will be with you shortly.  **If they have any issues connecting, or need assistance please contact MyChart service desk [phone])**  **If using a computer, in order to ensure the best quality for their visit they will need to use either of the following Internet Browsers: [employer], or Google Chrome**  IF USING DOXIMITY or DOXY.ME - The patient will receive a link just prior to their visit, either by text or email (to be determined day of appointment depending on if it's doxy.me or Doximity).     FULL LENGTH CONSENT FOR TELE-HEALTH VISIT   I hereby voluntarily request, consent and authorize [facility] and its employed or contracted physicians, physician assistants, nurse practitioners or other licensed health care professionals (the Practitioner), to provide me with telemedicine health care services (the Services") as deemed necessary by the treating Practitioner. I acknowledge and consent to receive the Services by the Practitioner via telemedicine. I understand that the telemedicine visit will involve communicating with the Practitioner through live audiovisual communication technology and the disclosure of certain medical information by electronic transmission. I acknowledge that I have been given the opportunity to request an in-person assessment or other available alternative prior to the telemedicine visit and am voluntarily participating in the telemedicine visit.  I understand that I have the right to withhold or withdraw my consent to the use  of telemedicine in the course of my care at any time, without affecting my right to future care or treatment, and that the Practitioner or I may terminate the telemedicine visit at any time. I understand that I have the right to inspect all information obtained and/or recorded in the course of the telemedicine visit and may receive copies of available information for a reasonable fee.  I understand that some of the potential risks of receiving the Services via telemedicine include:   Delay or interruption in medical evaluation due to technological equipment failure or disruption;  Information transmitted may not be sufficient (e.g. poor resolution of images) to allow for appropriate medical decision making by the Practitioner; and/or   In rare instances, security protocols could fail, causing a breach of personal health information.  Furthermore, I acknowledge that it is my responsibility to provide information about my medical history, conditions and care that is complete and accurate to the best of my ability. I acknowledge that Practitioner's advice, recommendations, and/or decision may be based on factors not within their control, such as incomplete or inaccurate data  provided by me or distortions of diagnostic images or specimens that may result from electronic transmissions. I understand that the practice of medicine is not an exact science and that Practitioner makes no warranties or guarantees regarding treatment outcomes. I acknowledge that I will receive a copy of this consent concurrently upon execution via email to the email address I last provided but may also request a printed copy by calling the office of [facility].    I understand that my insurance will be billed for this visit.   I have read or had this consent read to me.  I understand the contents of this consent, which adequately explains the benefits and risks of the Services being provided via telemedicine.   I have been  provided ample opportunity to ask questions regarding this consent and the Services and have had my questions answered to my satisfaction.  I give my informed consent for the services to be provided through the use of telemedicine in my medical care  By participating in this telemedicine visit I agree to the above.

## 2018-08-14 ENCOUNTER — Telehealth (INDEPENDENT_AMBULATORY_CARE_PROVIDER_SITE_OTHER): Payer: Medicare Other | Admitting: Cardiovascular Disease

## 2018-08-14 ENCOUNTER — Other Ambulatory Visit: Payer: Self-pay

## 2018-08-14 VITALS — BP 149/93 | HR 84 | Ht 65.0 in | Wt 141.0 lb

## 2018-08-14 DIAGNOSIS — Z7189 Other specified counseling: Secondary | ICD-10-CM

## 2018-08-14 DIAGNOSIS — R002 Palpitations: Secondary | ICD-10-CM

## 2018-08-14 DIAGNOSIS — I480 Paroxysmal atrial fibrillation: Secondary | ICD-10-CM

## 2018-08-14 MED ORDER — DILTIAZEM HCL 30 MG PO TABS: 30.0000 mg | ORAL_TABLET | Freq: Three times a day (TID) | ORAL | 11 refills | Status: DC | PRN

## 2018-08-14 NOTE — Telephone Encounter (Signed)
Spoke with patient who called to report BP and pulse readings since she bought a new BP cuff. The following readings are taken at around 1130 each day except where noted.  4/14 123/100 mmHg, HR 126 4/15 123/102, 134 4/16 124/85, 136 4/17 115/81, 128 4/18 135/99, 95 (taken later in the afternoon) 4/19 137/103, 108 and later @ 5:45 pm 134/105, 117 4/20 114/95, 102  Takes medications about 7:30 am each morning. States she is feeling "jittery" and does not rest well. She verbalized agreement and consent to have a virtual visit with Dr. Acie Fredrickson today. She will get BP/HR/weight in preparation for the visit. She thanked me for the call.

## 2018-08-14 NOTE — Telephone Encounter (Signed)
Follow Up:    Pt says her face time is not working.

## 2018-08-14 NOTE — Progress Notes (Signed)
Virtual Visit via Video Note   This visit type was conducted due to national recommendations for restrictions regarding the COVID-19 Pandemic (e.g. social distancing) in an effort to limit this patient's exposure and mitigate transmission in our community.  Due to her co-morbid illnesses, this patient is at least at moderate risk for complications without adequate follow up.  This format is felt to be most appropriate for this patient at this time.  All issues noted in this document were discussed and addressed.  A limited physical exam was performed with this format.  Please refer to the patient's chart for her consent to telehealth for [facility].   Evaluation Performed:  Follow-up visit  Date:  08/14/2018   ID:  Mary Mercado, DOB 03/13/1941, MRN [id number]  Patient Location: Home Provider Location: Home  PCP:  [name], MD  Cardiologist:  No primary care provider on file.  Electrophysiologist:  None   Chief Complaint:  Atrial fib with RVR   History of Present Illness:    Mary Mercado is a 78 y.o. female with a history of hypertension, hyperlipidemia, palpitations and history of stroke.  Event monitor revealed episodes of paroxysmal atrial fibrillation.  She is continued to have rapid episodes of atrial fibrillation.  We added Cardizem CD 180 mg a day to her medical regimen a week ago.     Since then, she has been taking her BP BP has been ok.   Diastolic BP has been a bit high She has been jittery HR was 157 this am  Now is 87.  Also has chest heaviness     The patient does not have symptoms concerning for COVID-19 infection (fever, chills, cough, or new shortness of breath).    Past Medical History:  Diagnosis Date  . Allergy   . CVA (cerebral vascular accident) ([place])   . Hyperlipidemia LDL goal <70   . Hypertension   . Palpitations   . Vertigo, benign positional    Past Surgical History:  Procedure Laterality Date  . Hemilaminectomy and  microdiskectomy at L4-5 on the left.  12/10/2003  . TEE WITHOUT CARDIOVERSION N/A 06/05/2018   Procedure: TRANSESOPHAGEAL ECHOCARDIOGRAM (TEE);  Surgeon: Lelon Perla, MD;  Location: [facility] ENDOSCOPY;  Service: Cardiovascular;  Laterality: N/A;  loop     Current Meds  Medication Sig  . apixaban (ELIQUIS) 5 MG TABS tablet Take 1 tablet (5 mg total) by mouth 2 (two) times daily.  Marland Kitchen atorvastatin (LIPITOR) 40 MG tablet Take 1 tablet (40 mg total) by mouth daily at 6 PM.  . diltiazem (CARDIZEM CD) 180 MG 24 hr capsule Take 1 capsule (180 mg total) by mouth daily.  . metoprolol succinate (TOPROL-XL) 50 MG 24 hr tablet Take 1 tablet (50 mg total) by mouth daily.  . pantoprazole (PROTONIX) 40 MG tablet Take 1 tablet (40 mg total) by mouth daily.     Allergies:   Acetaminophen   Social History   Tobacco Use  . Smoking status: Never Smoker  . Smokeless tobacco: Never Used  Substance Use Topics  . Alcohol use: No  . Drug use: No     Family Hx: The patient's family history includes Colon cancer in her father; Diabetes in her mother; Heart attack in her mother; Heart attack (age of onset: 46) in her brother.  ROS:   Please see the history of present illness.     All other systems reviewed and are negative.   Prior CV studies:   The following  studies were reviewed today:    Labs/Other Tests and Data Reviewed:    EKG:  No ECG reviewed.  Recent Labs: 06/06/2018: ALT 24; BUN 13; Creatinine, Ser 0.67; Hemoglobin 12.6; Platelets 224; Potassium 3.9; Sodium 140   Recent Lipid Panel Lab Results  Component Value Date/Time   CHOL 243 (H) 06/01/2018 05:48 AM   TRIG 80 06/01/2018 05:48 AM   HDL 110 06/01/2018 05:48 AM   CHOLHDL 2.2 06/01/2018 05:48 AM   LDLCALC 117 (H) 06/01/2018 05:48 AM    Wt Readings from Last 3 Encounters:  07/31/18 150 lb (68 kg)  07/04/18 138 lb 12.8 oz (63 kg)  06/28/18 132 lb (59.9 kg)     Objective:    Vital Signs:  There were no vitals taken for this  visit.   General:   Appears healthy,   NAD HEENT:   No obvious JVD or lymphadenopathy Resp:   Normal work of breathing,   resp rate is normal  CV :   BP and HR are normal ,  No edema Abd:   No abdomina swelling , Ext:   No clubbing, cyanosis, or edema  Neuro:   Alert and oriented x 3.   No obvious motor deficits Skin : no obvious rashes   ASSESSMENT & PLAN:    1. Atrial fibrillation: Mary Mercado has persistent atrial fibrillation.  She has intermittent episodes when her heart rate goes fairly quickly.  She is on Cardizem CD 180 mg a day and metoprolol XL 50 mg a day.  We will add diltiazem immediate release 30 mg tablets To take when she is having episodes of fast heart rate.  We discussed that the ultimate treatment would be for a cardioversion which we will try to do in the next month or so.    2.  Leg edema:   She has 1 + leg edema  Have given her info on getting the [facility]   We will follow up with her next week   COVID-19 Education: The signs and symptoms of COVID-19 were discussed with the patient and how to seek care for testing (follow up with PCP or arrange E-visit).  The importance of social distancing was discussed today.  Time:   Today, I have spent 17  minutes with the patient with telehealth technology discussing the above problems.    Additional 13 minutes in precharting and charting    Medication Adjustments/Labs and Tests Ordered: Current medicines are reviewed at length with the patient today.  Concerns regarding medicines are outlined above.   Tests Ordered: No orders of the defined types were placed in this encounter.   Medication Changes: No orders of the defined types were placed in this encounter.   Disposition:  Follow up in 1 week(s)  Signed, Mertie Moores, MD  08/14/2018 11:43 AM    [facility]

## 2018-08-14 NOTE — Patient Instructions (Signed)
Medication Instructions:  Your physician has recommended you make the following change in your medication:  START Diltiazem (Cardizem) 30 mg up to 3 times per day as needed for fast heart rate  If you need a refill on your cardiac medications before your next appointment, please call your pharmacy.    Lab work: None Ordered    Testing/Procedures: None Ordered   Follow-Up: Your physician recommends that you have a virtual follow-up appointment on Monday April 27 with Dr. Acie Fredrickson at 11:40 am

## 2018-08-16 ENCOUNTER — Ambulatory Visit: Payer: Medicare Other | Admitting: Physical Therapy

## 2018-08-17 ENCOUNTER — Telehealth: Payer: Self-pay | Admitting: Cardiovascular Disease

## 2018-08-17 NOTE — Telephone Encounter (Signed)
I spoke to the patient who was calling because she checked her BP and HR at 2 pm and they were 135/93 and 127 bpm respectively.  She is taking a Diltiazem 30 mg tablet and will check her BP/HR at 3pm/ 4pm and will call with a reading.

## 2018-08-17 NOTE — Telephone Encounter (Signed)
Patient called to give an update on her HR at 4:00 pm.  It had gradually decreased. At 4 pm BP was 130/97 HR 104 bpm.  She will continue to monitor over the weekend and will follow Dr Elmarie Shiley order to not take more than 3 Diltiazem 30 mg tablets in a 24 hour timeframe.  She has a visit with Dr Acie Fredrickson on 4/27 for further evaluation.

## 2018-08-17 NOTE — Telephone Encounter (Signed)
I spoke to the patient who called to follow up as was told with Sharyn Lull to update her BP and HR readings.  She wanted [place] to know that she had taken her morning medications this morning and then checked her BP and HR at 11:30 am and it was 120/97; 127 bpm respectively, so she took her additional Diltiazem 30 mg. Asymptomatic.    She was wondering when she should take another Diltiazem 30 mg tablet.  I told her to allow some time and recheck her BP and HR and call us with an update at 2 pm.  She verbalized understanding.  She has a visit with Dr Acie Fredrickson on Monday 4/27.

## 2018-08-17 NOTE — Telephone Encounter (Signed)
F/U Message            Patient is returning Michael's call.

## 2018-08-17 NOTE — Telephone Encounter (Signed)
New Message   Pt is calling with her BP reading 2pm 135/93 hr 127    Please call back

## 2018-08-17 NOTE — Telephone Encounter (Signed)
New Message:    Patient calling to make a BP report 08/14/18 11 am 149/93 pulse 84, 08/15/18 8:30 am 116/92 pulse 123 took a some medication. 08/16/18  Pm 115/91 pulse 81, 08/16/18 10 pm 118/94 pulse 154 took some medication. 08/17/18 10 am 120/97 pulse 127. Patient thinking about taking another pill. Please call patient back.

## 2018-08-20 ENCOUNTER — Encounter: Payer: Self-pay | Admitting: Cardiovascular Disease

## 2018-08-20 ENCOUNTER — Telehealth: Payer: Medicare Other | Admitting: Physician Assistant

## 2018-08-20 ENCOUNTER — Other Ambulatory Visit: Payer: Self-pay

## 2018-08-20 ENCOUNTER — Telehealth (INDEPENDENT_AMBULATORY_CARE_PROVIDER_SITE_OTHER): Payer: Medicare Other | Admitting: Cardiovascular Disease

## 2018-08-20 VITALS — BP 114/89 | HR 103 | Ht 65.0 in | Wt 135.0 lb

## 2018-08-20 DIAGNOSIS — I480 Paroxysmal atrial fibrillation: Secondary | ICD-10-CM | POA: Diagnosis not present

## 2018-08-20 MED ORDER — METOPROLOL SUCCINATE ER 25 MG PO TB24: 75.0000 mg | ORAL_TABLET | Freq: Every day | ORAL | 3 refills | Status: DC

## 2018-08-20 NOTE — Progress Notes (Signed)
Virtual Visit via Video Note   This visit type was conducted due to national recommendations for restrictions regarding the COVID-19 Pandemic (e.g. social distancing) in an effort to limit this patient's exposure and mitigate transmission in our community.  Due to her co-morbid illnesses, this patient is at least at moderate risk for complications without adequate follow up.  This format is felt to be most appropriate for this patient at this time.  All issues noted in this document were discussed and addressed.  A limited physical exam was performed with this format.  Please refer to the patient's chart for her consent to telehealth for [facility].   Evaluation Performed:  Follow-up visit  Date:  08/20/2018   ID:  Mary Mercado, DOB 1941-04-04, MRN [id number]  Patient Location: Home Provider Location: Office  PCP:  [name], MD  Cardiologist:  No primary care provider on file.  Electrophysiologist:  None   Chief Complaint:  Atrial fib   History of Present Illness:    Mary Mercado is a 78 y.o. female with atrial fib  Had a video visit with her on April 21.  We had added diltiazem slow release  180 mg a day .  We added diltiazem immediate release 30 mg tablets to take 3-4 times a day for when her heart rate is elevated.  She is also been complaining  leg edema.  We recommend a lounge doctor leg rest.  The additional diltiazem seems to be working well.  She takes 1-2 of the Diltiazem 30 mg tabs a day .  Typical HR is low 100s  Takes the Dilt 180 mg CD at 6 pm.       The patient does not have symptoms concerning for COVID-19 infection (fever, chills, cough, or new shortness of breath).    Past Medical History:  Diagnosis Date  . Allergy   . CVA (cerebral vascular accident) ([place])   . Hyperlipidemia LDL goal <70   . Hypertension   . Palpitations   . Vertigo, benign positional    Past Surgical History:  Procedure Laterality Date  . Hemilaminectomy and  microdiskectomy at L4-5 on the left.  12/10/2003  . TEE WITHOUT CARDIOVERSION N/A 06/05/2018   Procedure: TRANSESOPHAGEAL ECHOCARDIOGRAM (TEE);  Surgeon: Lelon Perla, MD;  Location: [facility] ENDOSCOPY;  Service: Cardiovascular;  Laterality: N/A;  loop     Current Meds  Medication Sig  . apixaban (ELIQUIS) 5 MG TABS tablet Take 1 tablet (5 mg total) by mouth 2 (two) times daily.  Marland Kitchen atorvastatin (LIPITOR) 40 MG tablet Take 1 tablet (40 mg total) by mouth daily at 6 PM.  . diltiazem (CARDIZEM CD) 180 MG 24 hr capsule Take 1 capsule (180 mg total) by mouth daily.  Marland Kitchen diltiazem (CARDIZEM) 30 MG tablet Take 1 tablet (30 mg total) by mouth 3 (three) times daily as needed (fast heart rate).  . metoprolol succinate (TOPROL-XL) 50 MG 24 hr tablet Take 1 tablet (50 mg total) by mouth daily.  . pantoprazole (PROTONIX) 40 MG tablet Take 1 tablet (40 mg total) by mouth daily.     Allergies:   Acetaminophen   Social History   Tobacco Use  . Smoking status: Never Smoker  . Smokeless tobacco: Never Used  Substance Use Topics  . Alcohol use: No  . Drug use: No     Family Hx: The patient's family history includes Colon cancer in her father; Diabetes in her mother; Heart attack in her mother; Heart attack (age  of onset: 23) in her brother.  ROS:   Please see the history of present illness.     All other systems reviewed and are negative.   Prior CV studies:   The following studies were reviewed today:    Labs/Other Tests and Data Reviewed:    EKG:  No ECG reviewed.  Recent Labs: 06/06/2018: ALT 24; BUN 13; Creatinine, Ser 0.67; Hemoglobin 12.6; Platelets 224; Potassium 3.9; Sodium 140   Recent Lipid Panel Lab Results  Component Value Date/Time   CHOL 243 (H) 06/01/2018 05:48 AM   TRIG 80 06/01/2018 05:48 AM   HDL 110 06/01/2018 05:48 AM   CHOLHDL 2.2 06/01/2018 05:48 AM   LDLCALC 117 (H) 06/01/2018 05:48 AM    Wt Readings from Last 3 Encounters:  08/20/18 135 lb (61.2 kg)   08/14/18 141 lb (64 kg)  07/31/18 150 lb (68 kg)     Objective:    Vital Signs:  BP 114/89 (BP Location: Left Arm, Patient Position: Sitting, Cuff Size: Normal)   Pulse (!) 103   Ht 5\' 5"  (1.651 m)   Wt 135 lb (61.2 kg)   BMI 22.47 kg/m    VITAL SIGNS:  reviewed GEN:  no acute distress EYES:  sclerae anicteric, EOMI - Extraocular Movements Intact RESPIRATORY:  normal respiratory effort, symmetric expansion CARDIOVASCULAR:  no peripheral edema SKIN:  no rash, lesions or ulcers. MUSCULOSKELETAL:  no obvious deformities. NEURO:  alert and oriented x 3, no obvious focal deficit PSYCH:  normal affect  ASSESSMENT & PLAN:    Persistent atrial fibrillation : Overall seems to be she still has episodes of tachycardia.  Will increase the Toprol-XL to 75 mg a day.  She will continue diltiazem slow release 180 mg a day and also continue to take diltiazem immediate release 30 mg 4 times a day as needed for heart rate greater than 100.  We discussed the cardioversion.  We will plan on seeing her again in 1 month via virtual office visit and we will cardioversion when we are once again doing those procedures in hospital.   COVID-19 Education: The signs and symptoms of COVID-19 were discussed with the patient and how to seek care for testing (follow up with PCP or arrange E-visit).  The importance of social distancing was discussed today.  Time:   Today, I have spent  15  minutes with the patient with telehealth technology discussing the above problems.     Medication Adjustments/Labs and Tests Ordered: Current medicines are reviewed at length with the patient today.  Concerns regarding medicines are outlined above.   Tests Ordered: No orders of the defined types were placed in this encounter.   Medication Changes: No orders of the defined types were placed in this encounter.   Disposition:  Follow up in 1 month(s)  Signed, Mertie Moores, MD  08/20/2018 11:38 AM    [facility]

## 2018-08-20 NOTE — Patient Instructions (Addendum)
Medication Instructions:  Your physician has recommended you make the following change in your medication:  INCREASE Metoprolol succinate (Toprol XL) to 75 mg once daily   If you need a refill on your cardiac medications before your next appointment, please call your pharmacy.     Lab work: None Ordered    Testing/Procedures: None Ordered    Follow-Up: Your physician recommends that you have a Virtual follow-up appointment on Tuesday May 19 at 9:20 am with Dr. Acie Fredrickson

## 2018-08-27 ENCOUNTER — Telehealth: Payer: Self-pay | Admitting: Cardiovascular Disease

## 2018-08-27 NOTE — Telephone Encounter (Signed)
New message:   Patient calling to leave BP numbers. 08/21/18 12 pm 127/98 pulse 105, 5 pm 113/85 pulse 100. 08/22/18 11 am 101/85 pulse 113, 9 pm 114/77 pulse 113. 08/23/18 10:30 am 129/83 pulse 86, 08/24/18 11 am 111/76 pulse 94, 7 pm 121/86 pulse 105. 08/25/18 11 am 117/75 pulse 83, 08/26/18 11 am 111/98 pulse 96. Today 11 am 118/87 pulse 107.

## 2018-08-28 NOTE — Telephone Encounter (Signed)
Spoke with patient and advised her that Dr. Acie Fredrickson has reviewed her heart rate and BP readings and that he does not recommend any additional medication changes at this time. He advised that we may be able to schedule her cardioversion soon.  Patient states she is feeling well and has not taken the diltiazem 30 mg this week. She states she is elevating her legs each day and the swelling has decreased. I advised her to continue to monitor and to call back with questions or concerns prior to our follow-up call to her to schedule cardioversion. She thanked me for the call.

## 2018-09-04 ENCOUNTER — Telehealth: Payer: Self-pay | Admitting: Cardiovascular Disease

## 2018-09-04 NOTE — Telephone Encounter (Signed)
  Pt c/o BP issue: STAT if pt c/o blurred vision, one-sided weakness or slurred speech  1. What are your last 5 BP readings?  5/5   133/90 P 102    120/90 P 86 5/6   97/71   P 99         5/7   122/92 P 114 took pill 5/8   131/85 P 92      127/96 P 71 5/9   121/74 P 121 took pill   132/93 P 99 5/10 121/86  P 85     133/99 P 92  5/11                          116/85 P 125 took pill 5/12 112/89 P 112 took pill  2. Are you having any other symptoms (ex. Dizziness, headache, blurred vision, passed out)? no  3. What is your BP issue? Patient was asked to call with blood pressures over the last week.

## 2018-09-05 NOTE — Telephone Encounter (Signed)
These look good

## 2018-09-06 ENCOUNTER — Telehealth: Payer: Self-pay | Admitting: Cardiovascular Disease

## 2018-09-06 ENCOUNTER — Telehealth: Payer: Self-pay

## 2018-09-06 NOTE — Telephone Encounter (Signed)
Returned pt's call, left voicemail.

## 2018-09-06 NOTE — Telephone Encounter (Signed)
New Message   Patient calling back to confirm demographics please call back.

## 2018-09-06 NOTE — Telephone Encounter (Signed)
New Message   Patient states her cell phone didn't ring so call her house phone.

## 2018-09-06 NOTE — Telephone Encounter (Signed)
Sent patient a message through MyChart that per Dr. Acie Fredrickson her BP and pulse readings look good and to continue to monitor.

## 2018-09-06 NOTE — Telephone Encounter (Signed)
Left message for pt, was calling to confirm demographics

## 2018-09-07 ENCOUNTER — Other Ambulatory Visit: Payer: Self-pay

## 2018-09-07 NOTE — Patient Outreach (Signed)
Telephone outreach to patient to obtain mRs was successfully completed. mRs= 2. 

## 2018-09-10 NOTE — Telephone Encounter (Signed)
New Message:     Pt's Vitals:   Tuesday 09-04-18- Blood Pressure was 125/80 and heart rate was 73- Sunday 09-09-18- Blood Pressure was 112/89 and heart rate was 112. She took the Diltiazem. Wednesday 09-05-27- Blood Pressure was 110/84 and heart rate was 123- She took the Diltiazem- Thurs- 09-06-18- Blood Pressure was 113/88 and heart rate was 113- she took Diltiazem- Friday-09-07-18- Blood Pressure was 114/75 and heart rate 77- Sat- 09-08-18- Blood Pressure  Was 125/80 and heart rate 73- Sun-09-09-18- Blood Pressure was 120/90 and heart rate was 93 Today-09-10-18- Blood Pressure 125/96and heart rate was 108- took [place]

## 2018-09-11 ENCOUNTER — Telehealth: Payer: Self-pay | Admitting: Cardiovascular Disease

## 2018-09-11 ENCOUNTER — Other Ambulatory Visit: Payer: Self-pay | Admitting: Nurse Practitioner

## 2018-09-11 ENCOUNTER — Telehealth (INDEPENDENT_AMBULATORY_CARE_PROVIDER_SITE_OTHER): Payer: Medicare Other | Admitting: Cardiovascular Disease

## 2018-09-11 ENCOUNTER — Encounter: Payer: Self-pay | Admitting: Cardiovascular Disease

## 2018-09-11 ENCOUNTER — Other Ambulatory Visit: Payer: Self-pay

## 2018-09-11 VITALS — BP 117/76 | HR 111 | Ht 65.0 in | Wt 134.0 lb

## 2018-09-11 DIAGNOSIS — I480 Paroxysmal atrial fibrillation: Secondary | ICD-10-CM

## 2018-09-11 DIAGNOSIS — I4819 Other persistent atrial fibrillation: Secondary | ICD-10-CM | POA: Diagnosis not present

## 2018-09-11 DIAGNOSIS — Z7189 Other specified counseling: Secondary | ICD-10-CM

## 2018-09-11 NOTE — Patient Instructions (Addendum)
Medication Instructions:  Your physician recommends that you continue on your current medications as directed. Please refer to the Current Medication list given to you today.  If you need a refill on your cardiac medications before your next appointment, please call your pharmacy.    Testing/Procedures: You are scheduled for a Cardioversion on  with Dr. Acie Fredrickson.  Please arrive at the [facility] (Main Entrance A) at [facility]: [address] [place], [place] [address] at 8:30 am.   DIET: Nothing to eat or drink after midnight except a sip of water with medications (see medication instructions below)   Medication Instructions: Continue your anticoagulant: Eliquis You will need to continue your anticoagulant after your procedure until you are told by your Provider that it is safe to stop   Labs:  Come to: [facility] on the first floor at [address] for your lab tests   On the day of your procedure, you must have a responsible person to drive you home and stay in the waiting area during your procedure. Failure to do so could result in cancellation.  Bring your insurance cards.  *Special Note: Every effort is made to have your procedure done on time. Occasionally there are emergencies that occur at the hospital that may cause delays. Please be patient if a delay does occur.    Follow-Up: Your physician recommends that you have a follow-up virtual appointment in 1 month with Dr. Acie Fredrickson or a member of his team Richardson Dopp, PA [place], [place] Pecolia Ades, NP

## 2018-09-11 NOTE — Telephone Encounter (Signed)
New Message    Pt is calling because she says she had a message that she has an appt tomorrow    Please call

## 2018-09-11 NOTE — Progress Notes (Signed)
Virtual Visit via Video Note   This visit type was conducted due to national recommendations for restrictions regarding the COVID-19 Pandemic (e.g. social distancing) in an effort to limit this patient's exposure and mitigate transmission in our community.  Due to her co-morbid illnesses, this patient is at least at moderate risk for complications without adequate follow up.  This format is felt to be most appropriate for this patient at this time.  All issues noted in this document were discussed and addressed.  A limited physical exam was performed with this format.  Please refer to the patient's chart for her consent to telehealth for [facility].   Date:  09/11/2018   ID:  Mary Mercado, DOB [birth year], MRN [id number]  Patient Location: Home Provider Location: Home  PCP:  [name], MD  Cardiologist:   Lorina Duffner  Electrophysiologist:  None   Evaluation Performed:  Follow-Up Visit  Chief Complaint:  Atrial fib  Previous notes:  Mary Mercado is a 78 y.o. female with atrial fib  Had a video visit with her on April 21.  We had added diltiazem slow release  180 mg a day .  We added diltiazem immediate release 30 mg tablets to take 3-4 times a day for when her heart rate is elevated.  She is also been complaining  leg edema.  We recommend a lounge doctor leg rest.  The additional diltiazem seems to be working well.  She takes 1-2 of the Diltiazem 30 mg tabs a day .  Typical HR is low 100s  Takes the Dilt 180 mg CD at 6 pm.    Sep 11, 2018    Mary Mercado is a 78 y.o. female with atrial fib HR is a bit elevated today  Takes the Diltiazem 30 mg tabs on occasion Usually takes only 1 a day   The patient does not have symptoms concerning for COVID-19 infection (fever, chills, cough, or new shortness of breath).    Past Medical History:  Diagnosis Date  . Allergy   . CVA (cerebral vascular accident) ([place])   . Hyperlipidemia LDL goal <70   . Hypertension   .  Palpitations   . Vertigo, benign positional    Past Surgical History:  Procedure Laterality Date  . Hemilaminectomy and microdiskectomy at L4-5 on the left.  12/10/2003  . TEE WITHOUT CARDIOVERSION N/A 06/05/2018   Procedure: TRANSESOPHAGEAL ECHOCARDIOGRAM (TEE);  Surgeon: Lelon Perla, MD;  Location: [facility] ENDOSCOPY;  Service: Cardiovascular;  Laterality: N/A;  loop     Current Meds  Medication Sig  . apixaban (ELIQUIS) 5 MG TABS tablet Take 1 tablet (5 mg total) by mouth 2 (two) times daily.  Marland Kitchen atorvastatin (LIPITOR) 40 MG tablet Take 1 tablet (40 mg total) by mouth daily at 6 PM.  . diltiazem (CARDIZEM CD) 180 MG 24 hr capsule Take 1 capsule (180 mg total) by mouth daily.  Marland Kitchen diltiazem (CARDIZEM) 30 MG tablet Take 1 tablet (30 mg total) by mouth 3 (three) times daily as needed (fast heart rate).  . metoprolol succinate (TOPROL-XL) 25 MG 24 hr tablet Take 3 tablets (75 mg total) by mouth daily. Take with or immediately following a meal.  . pantoprazole (PROTONIX) 40 MG tablet Take 1 tablet (40 mg total) by mouth daily.     Allergies:   Acetaminophen   Social History   Tobacco Use  . Smoking status: Never Smoker  . Smokeless tobacco: Never Used  Substance Use Topics  . Alcohol use:  No  . Drug use: No     Family Hx: The patient's family history includes Colon cancer in her father; Diabetes in her mother; Heart attack in her mother; Heart attack (age of onset: 4) in her brother.  ROS:   Please see the history of present illness.     All other systems reviewed and are negative.   Prior CV studies:   The following studies were reviewed today:   Labs/Other Tests and Data Reviewed:    EKG:  No ECG reviewed.  Recent Labs: 06/06/2018: ALT 24; BUN 13; Creatinine, Ser 0.67; Hemoglobin 12.6; Platelets 224; Potassium 3.9; Sodium 140   Recent Lipid Panel Lab Results  Component Value Date/Time   CHOL 243 (H) 06/01/2018 05:48 AM   TRIG 80 06/01/2018 05:48 AM   HDL 110  06/01/2018 05:48 AM   CHOLHDL 2.2 06/01/2018 05:48 AM   LDLCALC 117 (H) 06/01/2018 05:48 AM    Wt Readings from Last 3 Encounters:  08/20/18 135 lb (61.2 kg)  08/14/18 141 lb (64 kg)  07/31/18 150 lb (68 kg)     Objective:    Vital Signs:  Ht 5\' 5"  (1.651 m)   BMI 22.47 kg/m    VITAL SIGNS:  reviewed GEN:  no acute distress EYES:  sclerae anicteric, EOMI - Extraocular Movements Intact RESPIRATORY:  normal respiratory effort, symmetric expansion CARDIOVASCULAR:  no peripheral edema SKIN:  no rash, lesions or ulcers. MUSCULOSKELETAL:  no obvious deformities. NEURO:  alert and oriented x 3, no obvious focal deficit PSYCH:  normal affect  ASSESSMENT & PLAN:    1. Atrial fib:  She continues to have intermittant high heart rates associated with shortness of breath .   Will try to arrange for a cardioverson this Friday . She will need to have covid testing today at [facility] and have blood drawn at our office.   COVID-19 Education: The signs and symptoms of COVID-19 were discussed with the patient and how to seek care for testing (follow up with PCP or arrange E-visit).  The importance of social distancing was discussed today.  Time:   Today, I have spent  22  minutes with the patient with telehealth technology discussing the above problems.     Medication Adjustments/Labs and Tests Ordered: Current medicines are reviewed at length with the patient today.  Concerns regarding medicines are outlined above.   Tests Ordered: No orders of the defined types were placed in this encounter.   Medication Changes: No orders of the defined types were placed in this encounter.   Disposition:  Follow up in 4 week(s)  Signed, Mertie Moores, MD  09/11/2018 9:26 AM    [facility]

## 2018-09-11 NOTE — Telephone Encounter (Signed)
New message:    Patient downstairs getting blood work and they told they do not do the test for the virus. Please call patient back or [facility] downstairs

## 2018-09-11 NOTE — Telephone Encounter (Signed)
Spoke with patient who states she has returned home from [place] called our office and order was changed from nasal swab to serology and patient's lab work was completed. The patient thanked me for calling.

## 2018-09-11 NOTE — Telephone Encounter (Signed)
See additional telephone encounter dated 5/19. The patient will complete corona virus testing at the [facility] drive through testing center tomorrow, 5/20. She verbalized understanding of the instructions.

## 2018-09-11 NOTE — Telephone Encounter (Signed)
Spoke with patient who received notification of testing tomorrow via MyChart. I reviewed instructions for getting the pre-admission coronavirus testing done tomorrow morning at [facility]. The patient verbalized understanding and agreement with plan and thanked me for calling her.

## 2018-09-12 ENCOUNTER — Other Ambulatory Visit (HOSPITAL_COMMUNITY)
Admission: RE | Admit: 2018-09-12 | Discharge: 2018-09-12 | Disposition: A | Discharge status: Home / Self Care | Payer: Medicare Other | Source: Ambulatory Visit | Attending: Cardiovascular Disease | Admitting: Cardiovascular Disease

## 2018-09-12 DIAGNOSIS — Z1159 Encounter for screening for other viral diseases: Secondary | ICD-10-CM | POA: Diagnosis present

## 2018-09-12 DIAGNOSIS — U071 COVID-19: Secondary | ICD-10-CM | POA: Diagnosis not present

## 2018-09-12 LAB — CBC
Hematocrit: 47.6 % — ABNORMAL HIGH (ref 34.0–46.6)
Hemoglobin: 16 g/dL — ABNORMAL HIGH (ref 11.1–15.9)
MCH: 29.6 pg (ref 26.6–33.0)
MCHC: 33.6 g/dL (ref 31.5–35.7)
MCV: 88 fL (ref 79–97)
Platelets: 279 10*3/uL (ref 150–450)
RBC: 5.4 x10E6/uL — ABNORMAL HIGH (ref 3.77–5.28)
RDW: 12.1 % (ref 11.7–15.4)
WBC: 8.5 10*3/uL (ref 3.4–10.8)

## 2018-09-12 LAB — BASIC METABOLIC PANEL
BUN/Creatinine Ratio: 15 (ref 12–28)
BUN: 14 mg/dL (ref 8–27)
CO2: 24 mmol/L (ref 20–29)
Calcium: 10.1 mg/dL (ref 8.7–10.3)
Chloride: 100 mmol/L (ref 96–106)
Creatinine, Ser: 0.92 mg/dL (ref 0.57–1.00)
GFR calc Af Amer: 68 mL/min/{1.73_m2} (ref >59)
GFR calc non Af Amer: 59 mL/min/{1.73_m2} — ABNORMAL LOW (ref >59)
Glucose: 149 mg/dL — ABNORMAL HIGH (ref 65–99)
Potassium: 4.9 mmol/L (ref 3.5–5.2)
Sodium: 142 mmol/L (ref 134–144)

## 2018-09-12 LAB — SAR COV2 SEROLOGY (COVID19)AB(IGG),IA: SARS-CoV-2 Ab, IgG: NEGATIVE

## 2018-09-13 LAB — NOVEL CORONAVIRUS, NAA (HOSP ORDER, SEND-OUT TO REF LAB; TAT 18-24 HRS): SARS-CoV-2, NAA: DETECTED — AB

## 2018-09-13 NOTE — Progress Notes (Signed)
Contacted Dr. Elmarie Shiley office regarding positive Covid test. Dr. Acie Fredrickson aware and patient was informed. Elmyra Ricks with IP informed. Inbox message sent to Cheral Marker, RN

## 2018-09-14 ENCOUNTER — Telehealth: Payer: Self-pay | Admitting: *Deleted

## 2018-09-14 ENCOUNTER — Ambulatory Visit (HOSPITAL_COMMUNITY): Admission: RE | Admit: 2018-09-14 | Payer: Medicare Other | Source: Home / Self Care | Admitting: Cardiovascular Disease

## 2018-09-14 ENCOUNTER — Encounter (HOSPITAL_COMMUNITY): Admission: RE | Payer: Self-pay | Source: Home / Self Care

## 2018-09-14 DIAGNOSIS — U071 COVID-19: Secondary | ICD-10-CM

## 2018-09-14 SURGERY — CARDIOVERSION
Anesthesia: General

## 2018-09-14 NOTE — Telephone Encounter (Signed)
Copied from [place] [id number]. Topic: General - Other >> Sep 14, 2018  3:16 PM Yvette Rack wrote: Reason for CRM: Pt requests a call back from [place]. Cb# [phone]

## 2018-09-14 NOTE — Telephone Encounter (Signed)
Patient tested positive COVID-19. Patient enrolled in My Chart home companion and Case report form completed and faxed to [facility]. Patient advised she should only leave home for medical care only and verbalized understanding.

## 2018-09-18 ENCOUNTER — Ambulatory Visit: Payer: Medicare Other | Admitting: Family Medicine

## 2018-09-18 NOTE — Telephone Encounter (Signed)
Clinic RN spoke to patient on 09/14/2018. See other phone note from 09/14/2018.

## 2018-09-20 ENCOUNTER — Encounter: Payer: Self-pay | Admitting: Physical Medicine & Rehabilitation

## 2018-09-20 ENCOUNTER — Other Ambulatory Visit: Payer: Self-pay

## 2018-09-20 ENCOUNTER — Encounter: Payer: Medicare Other | Attending: Physical Medicine & Rehabilitation | Admitting: Physical Medicine & Rehabilitation

## 2018-09-20 VITALS — BP 127/102 | HR 105 | Temp 97.0°F | Ht 65.0 in | Wt 135.0 lb

## 2018-09-20 DIAGNOSIS — I63511 Cerebral infarction due to unspecified occlusion or stenosis of right middle cerebral artery: Secondary | ICD-10-CM | POA: Insufficient documentation

## 2018-09-20 DIAGNOSIS — I69398 Other sequelae of cerebral infarction: Secondary | ICD-10-CM | POA: Diagnosis not present

## 2018-09-20 DIAGNOSIS — E785 Hyperlipidemia, unspecified: Secondary | ICD-10-CM | POA: Insufficient documentation

## 2018-09-20 DIAGNOSIS — R269 Unspecified abnormalities of gait and mobility: Secondary | ICD-10-CM | POA: Diagnosis not present

## 2018-09-20 NOTE — Progress Notes (Signed)
Subjective:  Consent for phone visit  Patient ID: Mary Mercado, female    DOB: 10-02-1940, 78 y.o.   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 HPI  Left side still feels like pins and needles  Mod I with all self care and mobility  Not doing much walking due to elevated HR  Patient is awaiting cardioversion.  She had routine COVID testing and came back positive.  She has no known contact with any COVID positive individuals.  She has been very careful and not going into stores.  They have done drive-through with pharmacies and have their groceries delivered.  She is quite puzzled how she became positive.  Convalescent titers are negative.  She has not been retested Pain Inventory Average Pain 0 Pain Right Now 0 My pain is na  In the last 24 hours, has pain interfered with the following? General activity 0 Relation with others 0 Enjoyment of life 0 What TIME of day is your pain at its worst? na Sleep (in general) Good  Pain is worse with: na Pain improves with: na Relief from Meds: na  Mobility walk without assistance ability to climb steps?  yes do you drive?  yes  Function retired I need assistance with the following:  meal prep  Neuro/Psych weakness numbness tingling dizziness  Prior Studies Any changes since last visit?  no  Physicians involved in your care Any changes since last visit?  yes Primary care Dr. [name] Dr. Nahser-Cardiologist   Family History  Problem Relation Age of Onset  . Colon cancer Father   . Diabetes Mother   . Heart attack Mother        11  . Heart attack Brother 13   Social History   Socioeconomic History  . Marital status: Married    Spouse name: Not on file  . Number of children: Not on file  . Years of education: Not on file  . Highest education level: Not on file  Occupational History  . Not on file  Social Needs  . Financial resource strain: Not  on file  . Food insecurity:    Worry: Not on file    Inability: Not on file  . Transportation needs:    Medical: Not on file    Non-medical: Not on file  Tobacco Use  . Smoking status: Never Smoker  . Smokeless tobacco: Never Used  Substance and Sexual Activity  . Alcohol use: No  . Drug use: No  . Sexual activity: Not on file  Lifestyle  . Physical activity:    Days per week: Not on file    Minutes per session: Not on file  . Stress: Not on file  Relationships  . Social connections:    Talks on phone: Not on file    Gets together: Not on file    Attends religious service: Not on file    Active member of club or organization: Not on file    Attends meetings of clubs or organizations: Not on file    Relationship status: Not on file  Other Topics Concern  . Not on file  Social History Narrative  . Not on file   Past Surgical History:  Procedure Laterality Date  . Hemilaminectomy and microdiskectomy at L4-5 on the left.  12/10/2003  . TEE WITHOUT CARDIOVERSION N/A 06/05/2018   Procedure: TRANSESOPHAGEAL ECHOCARDIOGRAM (TEE);  Surgeon: Lelon Perla, MD;  Location: [place];  Service: Cardiovascular;  Laterality: N/A;  loop   Past Medical History:  Diagnosis Date  . Allergy   . CVA (cerebral vascular accident) ([place])   . Hyperlipidemia LDL goal <70   . Hypertension   . Palpitations   . Vertigo, benign positional    BP (!) 127/102 Comment: pt reported virtual visit  Pulse (!) 105 Comment: pt reported virtual visit  Temp (!) 97 F (36.1 C) Comment: pt reported virtual visit  Ht 5\' 5"  (1.651 m) Comment: pt reported virtual visit  Wt 135 lb (61.2 kg) Comment: pt reported virtual visit  BMI 22.47 kg/m   Opioid Risk Score:   Fall Risk Score:  `1  Depression screen PHQ 2/9  Depression screen [facility] 2/9 09/20/2018 06/14/2017  Decreased Interest 0 0  Down, Depressed, Hopeless 1 0  PHQ - 2 Score 1 0     Review of Systems  Constitutional: Negative.   HENT:  Negative.   Eyes: Negative.   Respiratory: Negative.   Cardiovascular: Negative.   Gastrointestinal: Negative.   Endocrine: Negative.   Genitourinary: Negative.   Musculoskeletal: Negative.        Hand and left side numbness and tingling  Skin: Negative.   Allergic/Immunologic: Negative.   Neurological: Positive for dizziness, weakness and numbness.       Tingling  Hematological: Negative.   Psychiatric/Behavioral: Negative.   All other systems reviewed and are negative.      Objective:   Physical Exam Vitals signs and nursing note reviewed.  Constitutional:      Appearance: Normal appearance.  Neurological:     Mental Status: She is alert and oriented to person, place, and time.  Psychiatric:        Mood and Affect: Mood normal.        Behavior: Behavior normal.   Remainder of exam is limited by phone visit       Assessment & Plan:  1.  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  Her primary complaint is inability to perform tandem gait and higher-level balance.  She is interested in remediation and for that reason will pursue outpatient therapy.  She has an order but because of the COVID crisis she has not been scheduled. She will follow-up with me in 6 weeks.  We will talk about driving at that time as well as her residual balance deficits.  Duration of call 15 minutes

## 2018-09-25 ENCOUNTER — Telehealth: Payer: Self-pay

## 2018-09-25 ENCOUNTER — Other Ambulatory Visit: Payer: Self-pay | Admitting: Interventional Cardiology

## 2018-09-25 NOTE — Telephone Encounter (Signed)
Pt last saw Dr Acie Fredrickson 09/11/18, last labs 09/11/18 Creat 0.92, age 78, weight 60.8kg, based on specified criteria pt is on appropriate dosage of Eliquis 5mg  BID.  Will refill rx.

## 2018-09-25 NOTE — Telephone Encounter (Signed)
Called pt to confirm appt date and time, pt wants a video visit for her appt. She will have vitals ready for appt. Consent given 08/14/2018.

## 2018-10-05 ENCOUNTER — Telehealth (INDEPENDENT_AMBULATORY_CARE_PROVIDER_SITE_OTHER): Payer: Medicare Other | Admitting: Cardiovascular Disease

## 2018-10-05 ENCOUNTER — Encounter: Payer: Self-pay | Admitting: Cardiovascular Disease

## 2018-10-05 ENCOUNTER — Other Ambulatory Visit: Payer: Self-pay

## 2018-10-05 VITALS — BP 132/85 | HR 81 | Ht 60.0 in | Wt 132.0 lb

## 2018-10-05 DIAGNOSIS — I480 Paroxysmal atrial fibrillation: Secondary | ICD-10-CM | POA: Diagnosis not present

## 2018-10-05 DIAGNOSIS — Z01812 Encounter for preprocedural laboratory examination: Secondary | ICD-10-CM

## 2018-10-05 DIAGNOSIS — Z7189 Other specified counseling: Secondary | ICD-10-CM | POA: Diagnosis not present

## 2018-10-05 NOTE — H&P (View-Only) (Signed)
Virtual Visit via Video Note   This visit type was conducted due to national recommendations for restrictions regarding the COVID-19 Pandemic (e.g. social distancing) in an effort to limit this patient's exposure and mitigate transmission in our community.  Due to her co-morbid illnesses, this patient is at least at moderate risk for complications without adequate follow up.  This format is felt to be most appropriate for this patient at this time.  All issues noted in this document were discussed and addressed.  A limited physical exam was performed with this format.  Please refer to the patient's chart for her consent to telehealth for [facility].   Date:  10/05/2018   ID:  Mary Mercado, Mary Mercado 1940-05-25, MRN [id number]  Patient Location: Home Provider Location: Home  PCP:  [name], MD  Cardiologist:  No primary care provider on file.  Electrophysiologist:  None   Evaluation Performed:  Follow-Up Visit  Chief Complaint:  Atrial fib,  2-3 s/p Covid infection   History of Present Illness:    Mary Mercado is a 78 y.o. female with Atrial fib.  We had set her up for a cardioversion but she tested + for Covid 19 . She never had any symptoms of covid  Still working with her afib  Afib has been better controlled.  Has not had to take an extra dilt for the past month HR is well controlled on Dilt 180 and toprol 75 a day   The patient does not have symptoms concerning for COVID-19 infection (fever, chills, cough, or new shortness of breath).    Past Medical History:  Diagnosis Date  . Allergy   . CVA (cerebral vascular accident) ([place])   . Hyperlipidemia LDL goal <70   . Hypertension   . Palpitations   . Vertigo, benign positional    Past Surgical History:  Procedure Laterality Date  . Hemilaminectomy and microdiskectomy at L4-5 on the left.  12/10/2003  . TEE WITHOUT CARDIOVERSION N/A 06/05/2018   Procedure: TRANSESOPHAGEAL ECHOCARDIOGRAM (TEE);   Surgeon: Lelon Perla, MD;  Location: [facility] ENDOSCOPY;  Service: Cardiovascular;  Laterality: N/A;  loop     Current Meds  Medication Sig  . atorvastatin (LIPITOR) 40 MG tablet Take 1 tablet (40 mg total) by mouth daily at 6 PM.  . diltiazem (CARDIZEM CD) 180 MG 24 hr capsule Take 1 capsule (180 mg total) by mouth daily.  Marland Kitchen diltiazem (CARDIZEM) 30 MG tablet Take 1 tablet (30 mg total) by mouth 3 (three) times daily as needed (fast heart rate).  Marland Kitchen ELIQUIS 5 MG TABS tablet TAKE 1 TABLET BY MOUTH TWICE A DAY  . metoprolol succinate (TOPROL-XL) 25 MG 24 hr tablet Take 25 mg by mouth daily. Take one 25 mg tablet with 50 mg tablet by mouth daily. Total of 75 mg daily.  . metoprolol succinate (TOPROL-XL) 50 MG 24 hr tablet Take 50 mg by mouth daily. Take with or immediately following a meal with the 25 mg tablet.  . pantoprazole (PROTONIX) 40 MG tablet Take 1 tablet (40 mg total) by mouth daily.  . Probiotic Product (PROBIOTIC PO) Take 1 capsule by mouth daily.     Allergies:   Acetaminophen   Social History   Tobacco Use  . Smoking status: Never Smoker  . Smokeless tobacco: Never Used  Substance Use Topics  . Alcohol use: No  . Drug use: No     Family Hx: The patient's family history includes Colon cancer in her  father; Diabetes in her mother; Heart attack in her mother; Heart attack (age of onset: 73) in her brother.  ROS:   Please see the history of present illness.     All other systems reviewed and are negative.   Prior CV studies:   The following studies were reviewed today:    Labs/Other Tests and Data Reviewed:    EKG:  No ECG reviewed.  Recent Labs: 06/06/2018: ALT 24 09/11/2018: BUN 14; Creatinine, Ser 0.92; Hemoglobin 16.0; Platelets 279; Potassium 4.9; Sodium 142   Recent Lipid Panel Lab Results  Component Value Date/Time   CHOL 243 (H) 06/01/2018 05:48 AM   TRIG 80 06/01/2018 05:48 AM   HDL 110 06/01/2018 05:48 AM   CHOLHDL 2.2 06/01/2018 05:48 AM    LDLCALC 117 (H) 06/01/2018 05:48 AM    Wt Readings from Last 3 Encounters:  10/05/18 132 lb (59.9 kg)  09/20/18 135 lb (61.2 kg)  09/11/18 134 lb (60.8 kg)     Objective:    Vital Signs:  BP 132/85   Pulse 81   Ht 5' (1.524 m)   Wt 132 lb (59.9 kg)   BMI 25.78 kg/m    VITAL SIGNS:  reviewed GEN:  no acute distress EYES:  sclerae anicteric, EOMI - Extraocular Movements Intact RESPIRATORY:  normal respiratory effort, symmetric expansion CARDIOVASCULAR:  no peripheral edema SKIN:  no rash, lesions or ulcers. MUSCULOSKELETAL:  no obvious deformities. NEURO:  alert and oriented x 3, no obvious focal deficit PSYCH:  normal affect  ASSESSMENT & PLAN:    1. Atrial fib:   HR is better controlled.   Will schedule her for cardioversion on July 8 .  2. Covid:   She was + for covid on her last screening.   Feels well.  Never had any symptoms     COVID-19 Education: The signs and symptoms of COVID-19 were discussed with the patient and how to seek care for testing (follow up with PCP or arrange E-visit).  The importance of social distancing was discussed today.  Time:   Today, I have spent  26  minutes with the patient with telehealth technology discussing the above problems.     Medication Adjustments/Labs and Tests Ordered: Current medicines are reviewed at length with the patient today.  Concerns regarding medicines are outlined above.   Tests Ordered: No orders of the defined types were placed in this encounter.   Medication Changes: No orders of the defined types were placed in this encounter.   Follow Up:  In Person in 3 month(s)  Signed, Mertie Moores, MD  10/05/2018 10:00 AM    [place]

## 2018-10-05 NOTE — Patient Instructions (Addendum)
Medication Instructions:  Your physician recommends that you continue on your current medications as directed. Please refer to the Current Medication list given to you today.  If you need a refill on your cardiac medications before your next appointment, please call your pharmacy.    Testing/Procedures: You are scheduled for a Cardioversion on Wednesday July 8 with Dr. Acie Fredrickson.  Please arrive at the [facility] (Main Entrance A) at [facility]: [address] [place], [place] [address] at 8 am.  DIET: Nothing to eat or drink after midnight except a sip of water with medications (see medication instructions below)  Medication Instructions: Continue your anticoagulant: Eliquis  You will need to continue your anticoagulant after your procedure until you are told by your provider that it is safe to stop   Labs: CBC, BMET---Come to the lab at [place] on 10/25/18 at 11:15 AM. You do not have to be fasting.  Your Pre-procedure COVID-19 Testing will be done on 10/25/18 at 11:40 AM at [facility] at [address] [place], [place] [address] After your swab you will be given a mask to wear and instructed to go home and quarantine/no visitors until after your procedure. If you test positive you will be notified and your procedure will be cancelled.    You must have a responsible person to drive you home and stay in the waiting area during your procedure. Failure to do so could result in cancellation.  Bring your insurance cards.  *Special Note: Every effort is made to have your procedure done on time. Occasionally there are emergencies that occur at the hospital that may cause delays. Please be patient if a delay does occur.    Follow-Up: Your physician recommends that you return for a follow-up appointment on Friday August 7 at 3:40 pm with Dr. Acie Fredrickson

## 2018-10-05 NOTE — Progress Notes (Signed)
Virtual Visit via Video Note   This visit type was conducted due to national recommendations for restrictions regarding the COVID-19 Pandemic (e.g. social distancing) in an effort to limit this patient's exposure and mitigate transmission in our community.  Due to her co-morbid illnesses, this patient is at least at moderate risk for complications without adequate follow up.  This format is felt to be most appropriate for this patient at this time.  All issues noted in this document were discussed and addressed.  A limited physical exam was performed with this format.  Please refer to the patient's chart for her consent to telehealth for [facility].   Date:  10/05/2018   ID:  Mary Mercado, Lind[place]baum 1940-12-16, MRN [id number]  Patient Location: Home Provider Location: Home  PCP:  [name], MD  Cardiologist:  No primary care provider on file.  Electrophysiologist:  None   Evaluation Performed:  Follow-Up Visit  Chief Complaint:  Atrial fib,  2-3 s/p Covid infection   History of Present Illness:    Mary Mercado is a 78 y.o. female with Atrial fib.  We had set her up for a cardioversion but she tested + for Covid 19 . She never had any symptoms of covid  Still working with her afib  Afib has been better controlled.  Has not had to take an extra dilt for the past month HR is well controlled on Dilt 180 and toprol 75 a day   The patient does not have symptoms concerning for COVID-19 infection (fever, chills, cough, or new shortness of breath).    Past Medical History:  Diagnosis Date  . Allergy   . CVA (cerebral vascular accident) ([place])   . Hyperlipidemia LDL goal <70   . Hypertension   . Palpitations   . Vertigo, benign positional    Past Surgical History:  Procedure Laterality Date  . Hemilaminectomy and microdiskectomy at L4-5 on the left.  12/10/2003  . TEE WITHOUT CARDIOVERSION N/A 06/05/2018   Procedure: TRANSESOPHAGEAL ECHOCARDIOGRAM (TEE);   Surgeon: Lelon Perla, MD;  Location: [facility] ENDOSCOPY;  Service: Cardiovascular;  Laterality: N/A;  loop     Current Meds  Medication Sig  . atorvastatin (LIPITOR) 40 MG tablet Take 1 tablet (40 mg total) by mouth daily at 6 PM.  . diltiazem (CARDIZEM CD) 180 MG 24 hr capsule Take 1 capsule (180 mg total) by mouth daily.  Marland Kitchen diltiazem (CARDIZEM) 30 MG tablet Take 1 tablet (30 mg total) by mouth 3 (three) times daily as needed (fast heart rate).  Marland Kitchen ELIQUIS 5 MG TABS tablet TAKE 1 TABLET BY MOUTH TWICE A DAY  . metoprolol succinate (TOPROL-XL) 25 MG 24 hr tablet Take 25 mg by mouth daily. Take one 25 mg tablet with 50 mg tablet by mouth daily. Total of 75 mg daily.  . metoprolol succinate (TOPROL-XL) 50 MG 24 hr tablet Take 50 mg by mouth daily. Take with or immediately following a meal with the 25 mg tablet.  . pantoprazole (PROTONIX) 40 MG tablet Take 1 tablet (40 mg total) by mouth daily.  . Probiotic Product (PROBIOTIC PO) Take 1 capsule by mouth daily.     Allergies:   Acetaminophen   Social History   Tobacco Use  . Smoking status: Never Smoker  . Smokeless tobacco: Never Used  Substance Use Topics  . Alcohol use: No  . Drug use: No     Family Hx: The patient's family history includes Colon cancer in her  father; Diabetes in her mother; Heart attack in her mother; Heart attack (age of onset: 81) in her brother.  ROS:   Please see the history of present illness.     All other systems reviewed and are negative.   Prior CV studies:   The following studies were reviewed today:    Labs/Other Tests and Data Reviewed:    EKG:  No ECG reviewed.  Recent Labs: 06/06/2018: ALT 24 09/11/2018: BUN 14; Creatinine, Ser 0.92; Hemoglobin 16.0; Platelets 279; Potassium 4.9; Sodium 142   Recent Lipid Panel Lab Results  Component Value Date/Time   CHOL 243 (H) 06/01/2018 05:48 AM   TRIG 80 06/01/2018 05:48 AM   HDL 110 06/01/2018 05:48 AM   CHOLHDL 2.2 06/01/2018 05:48 AM    LDLCALC 117 (H) 06/01/2018 05:48 AM    Wt Readings from Last 3 Encounters:  10/05/18 132 lb (59.9 kg)  09/20/18 135 lb (61.2 kg)  09/11/18 134 lb (60.8 kg)     Objective:    Vital Signs:  BP 132/85   Pulse 81   Ht 5' (1.524 m)   Wt 132 lb (59.9 kg)   BMI 25.78 kg/m    VITAL SIGNS:  reviewed GEN:  no acute distress EYES:  sclerae anicteric, EOMI - Extraocular Movements Intact RESPIRATORY:  normal respiratory effort, symmetric expansion CARDIOVASCULAR:  no peripheral edema SKIN:  no rash, lesions or ulcers. MUSCULOSKELETAL:  no obvious deformities. NEURO:  alert and oriented x 3, no obvious focal deficit PSYCH:  normal affect  ASSESSMENT & PLAN:    1. Atrial fib:   HR is better controlled.   Will schedule her for cardioversion on July 8 .  2. Covid:   She was + for covid on her last screening.   Feels well.  Never had any symptoms     COVID-19 Education: The signs and symptoms of COVID-19 were discussed with the patient and how to seek care for testing (follow up with PCP or arrange E-visit).  The importance of social distancing was discussed today.  Time:   Today, I have spent  26  minutes with the patient with telehealth technology discussing the above problems.     Medication Adjustments/Labs and Tests Ordered: Current medicines are reviewed at length with the patient today.  Concerns regarding medicines are outlined above.   Tests Ordered: No orders of the defined types were placed in this encounter.   Medication Changes: No orders of the defined types were placed in this encounter.   Follow Up:  In Person in 3 month(s)  Signed, Mertie Moores, MD  10/05/2018 10:00 AM    [place]

## 2018-10-09 ENCOUNTER — Other Ambulatory Visit: Payer: Self-pay

## 2018-10-09 ENCOUNTER — Encounter: Payer: Self-pay | Admitting: Family Medicine

## 2018-10-09 ENCOUNTER — Ambulatory Visit (INDEPENDENT_AMBULATORY_CARE_PROVIDER_SITE_OTHER): Payer: Medicare Other | Admitting: Family Medicine

## 2018-10-09 VITALS — BP 124/71 | HR 81

## 2018-10-09 DIAGNOSIS — R7303 Prediabetes: Secondary | ICD-10-CM | POA: Diagnosis not present

## 2018-10-09 DIAGNOSIS — I63511 Cerebral infarction due to unspecified occlusion or stenosis of right middle cerebral artery: Secondary | ICD-10-CM | POA: Diagnosis not present

## 2018-10-09 DIAGNOSIS — I10 Essential (primary) hypertension: Secondary | ICD-10-CM

## 2018-10-09 DIAGNOSIS — I63411 Cerebral infarction due to embolism of right middle cerebral artery: Secondary | ICD-10-CM | POA: Diagnosis not present

## 2018-10-09 DIAGNOSIS — I4819 Other persistent atrial fibrillation: Secondary | ICD-10-CM

## 2018-10-09 NOTE — Progress Notes (Signed)
Subjective:    Patient ID: Mary Mercado, female    DOB: 10-27-1940, 78 y.o.   MRN: [id number]  HPI Virtual Visit via Video Note  I connected with the patient on 10/09/18 at  1:15 PM EDT by a video enabled telemedicine application and verified that I am speaking with the correct person using two identifiers.  Location patient: home Location provider:work or home office Persons participating in the virtual visit: patient, provider  I discussed the limitations of evaluation and management by telemedicine and the availability of in person appointments. The patient expressed understanding and agreed to proceed.   HPI: Here to follow up on several issues. She feels fine in general. Her BP is stable, and today it is 124/71. Her pulse rate varies from the 70s to the 120s. She is scheduled for a cardioversion on 10-31-18. Of note the last time this was scheduled it had to be cancelled because she tested positive for the Covid-19 virus on 09-12-18. She had tested negative for Covid IgG on 09-11-18. She has never had any symptoms of the virus. They will be doing repeat Covid testing prior to the procedure. She does not check her glucoses at home but she does watch her calorie intake.    ROS: See pertinent positives and negatives per HPI.  Past Medical History:  Diagnosis Date  . Allergy   . CVA (cerebral vascular accident) ([place])   . Hyperlipidemia LDL goal <70   . Hypertension   . Palpitations   . Vertigo, benign positional     Past Surgical History:  Procedure Laterality Date  . Hemilaminectomy and microdiskectomy at L4-5 on the left.  12/10/2003  . TEE WITHOUT CARDIOVERSION N/A 06/05/2018   Procedure: TRANSESOPHAGEAL ECHOCARDIOGRAM (TEE);  Surgeon: Lelon Perla, MD;  Location: [facility] ENDOSCOPY;  Service: Cardiovascular;  Laterality: N/A;  loop    Family History  Problem Relation Age of Onset  . Colon cancer Father   . Diabetes Mother   . Heart attack Mother        108   . Heart attack Brother 23     Current Outpatient Medications:  .  atorvastatin (LIPITOR) 40 MG tablet, Take 1 tablet (40 mg total) by mouth daily at 6 PM., Disp: 90 tablet, Rfl: 3 .  diltiazem (CARDIZEM CD) 180 MG 24 hr capsule, Take 1 capsule (180 mg total) by mouth daily., Disp: 30 capsule, Rfl: 11 .  diltiazem (CARDIZEM) 30 MG tablet, Take 1 tablet (30 mg total) by mouth 3 (three) times daily as needed (fast heart rate)., Disp: 60 tablet, Rfl: 11 .  ELIQUIS 5 MG TABS tablet, TAKE 1 TABLET BY MOUTH TWICE A DAY, Disp: 60 tablet, Rfl: 6 .  metoprolol succinate (TOPROL-XL) 25 MG 24 hr tablet, Take 25 mg by mouth daily. Take one 25 mg tablet with 50 mg tablet by mouth daily. Total of 75 mg daily., Disp: , Rfl:  .  metoprolol succinate (TOPROL-XL) 50 MG 24 hr tablet, Take 50 mg by mouth daily. Take with or immediately following a meal with the 25 mg tablet., Disp: , Rfl:  .  pantoprazole (PROTONIX) 40 MG tablet, Take 1 tablet (40 mg total) by mouth daily., Disp: 90 tablet, Rfl: 3 .  Probiotic Product (PROBIOTIC PO), Take 1 capsule by mouth daily., Disp: , Rfl:   EXAM:  VITALS per patient if applicable:  GENERAL: alert, oriented, appears well and in no acute distress  HEENT: atraumatic, conjunttiva clear, no obvious abnormalities on inspection of  external nose and ears  NECK: normal movements of the head and neck  LUNGS: on inspection no signs of respiratory distress, breathing rate appears normal, no obvious gross SOB, gasping or wheezing  CV: no obvious cyanosis  MS: moves all visible extremities without noticeable abnormality  PSYCH/NEURO: pleasant and cooperative, no obvious depression or anxiety, speech and thought processing grossly intact  ASSESSMENT AND PLAN: She is scheduled for a cardioversion soon to treat the atrial fibrillation. Her HTN is stable. She is recovering well from the right MCA stroke. She will have repeat Covid-19 testing in a few weeks. Get an A1c soon to  follow prediabetes.  [name], MD  Discussed the following assessment and plan:  Prediabetes  Cerebrovascular accident (CVA) due to embolism of right middle cerebral artery [facility])  Essential hypertension  Persistent atrial fibrillation     I discussed the assessment and treatment plan with the patient. The patient was provided an opportunity to ask questions and all were answered. The patient agreed with the plan and demonstrated an understanding of the instructions.   The patient was advised to call back or seek an in-person evaluation if the symptoms worsen or if the condition fails to improve as anticipated.     Review of Systems     Objective:   Physical Exam        Assessment & Plan:

## 2018-10-25 ENCOUNTER — Other Ambulatory Visit: Payer: Self-pay

## 2018-10-25 ENCOUNTER — Other Ambulatory Visit (HOSPITAL_COMMUNITY)
Admission: RE | Admit: 2018-10-25 | Discharge: 2018-10-25 | Disposition: A | Discharge status: Home / Self Care | Payer: Medicare Other | Source: Ambulatory Visit | Attending: Cardiovascular Disease | Admitting: Cardiovascular Disease

## 2018-10-25 ENCOUNTER — Other Ambulatory Visit: Payer: Medicare Other | Admitting: *Deleted

## 2018-10-25 DIAGNOSIS — Z01812 Encounter for preprocedural laboratory examination: Secondary | ICD-10-CM | POA: Diagnosis not present

## 2018-10-25 DIAGNOSIS — I480 Paroxysmal atrial fibrillation: Secondary | ICD-10-CM

## 2018-10-25 DIAGNOSIS — R7303 Prediabetes: Secondary | ICD-10-CM

## 2018-10-25 DIAGNOSIS — Z1159 Encounter for screening for other viral diseases: Secondary | ICD-10-CM | POA: Insufficient documentation

## 2018-10-25 LAB — BASIC METABOLIC PANEL
BUN/Creatinine Ratio: 18 (ref 12–28)
BUN: 15 mg/dL (ref 8–27)
CO2: 24 mmol/L (ref 20–29)
Calcium: 9.8 mg/dL (ref 8.7–10.3)
Chloride: 100 mmol/L (ref 96–106)
Creatinine, Ser: 0.84 mg/dL (ref 0.57–1.00)
GFR calc Af Amer: 76 mL/min/{1.73_m2} (ref >59)
GFR calc non Af Amer: 66 mL/min/{1.73_m2} (ref >59)
Glucose: 101 mg/dL — ABNORMAL HIGH (ref 65–99)
Potassium: 4.1 mmol/L (ref 3.5–5.2)
Sodium: 140 mmol/L (ref 134–144)

## 2018-10-25 LAB — CBC
Hematocrit: 47.4 % — ABNORMAL HIGH (ref 34.0–46.6)
Hemoglobin: 15.2 g/dL (ref 11.1–15.9)
MCH: 29 pg (ref 26.6–33.0)
MCHC: 32.1 g/dL (ref 31.5–35.7)
MCV: 90 fL (ref 79–97)
Platelets: 294 10*3/uL (ref 150–450)
RBC: 5.25 x10E6/uL (ref 3.77–5.28)
RDW: 13.2 % (ref 11.7–15.4)
WBC: 7.2 10*3/uL (ref 3.4–10.8)

## 2018-10-25 LAB — SARS CORONAVIRUS 2 (TAT 6-24 HRS): SARS Coronavirus 2: NEGATIVE

## 2018-10-29 ENCOUNTER — Other Ambulatory Visit (HOSPITAL_COMMUNITY)
Admission: RE | Admit: 2018-10-29 | Discharge: 2018-10-29 | Disposition: A | Discharge status: Home / Self Care | Payer: Medicare Other | Source: Ambulatory Visit | Attending: Cardiovascular Disease | Admitting: Cardiovascular Disease

## 2018-10-29 DIAGNOSIS — Z01812 Encounter for preprocedural laboratory examination: Secondary | ICD-10-CM | POA: Diagnosis not present

## 2018-10-29 DIAGNOSIS — Z1159 Encounter for screening for other viral diseases: Secondary | ICD-10-CM | POA: Diagnosis not present

## 2018-10-30 ENCOUNTER — Ambulatory Visit: Payer: Medicare Other | Admitting: Physical Medicine & Rehabilitation

## 2018-10-30 LAB — SARS CORONAVIRUS 2 (TAT 6-24 HRS): SARS Coronavirus 2: NEGATIVE

## 2018-10-30 NOTE — Anesthesia Preprocedure Evaluation (Addendum)
Anesthesia Evaluation  Patient identified by MRN, date of birth, ID band Patient awake    Reviewed: Allergy & Precautions, NPO status , Patient's Chart, lab work & pertinent test results  Airway Mallampati: II  TM Distance: >3 FB     Dental   Pulmonary neg pulmonary ROS,    breath sounds clear to auscultation       Cardiovascular hypertension, Pt. on medications and Pt. 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; negative saline microcavitation study   Neuro/Psych CVA    GI/Hepatic negative GI ROS, Neg liver ROS,   Endo/Other  negative endocrine ROS  Renal/GU negative Renal ROS     Musculoskeletal   Abdominal   Peds  Hematology negative hematology ROS (+)   Anesthesia Other Findings   Reproductive/Obstetrics                            Anesthesia Physical Anesthesia Plan  ASA: III  Anesthesia Plan: General   Post-op Pain Management:    Induction: Intravenous  PONV Risk Score and Plan: 3 and Treatment may vary due to age or medical condition  Airway Management Planned: Natural Airway and Mask  Additional Equipment: None  Intra-op Plan:   Post-operative Plan:   Informed Consent: I have reviewed the patients History and Physical, chart, labs and discussed the procedure including the risks, benefits and alternatives for the proposed anesthesia with the patient or authorized representative who has indicated his/her understanding and acceptance.       Plan Discussed with: CRNA  Anesthesia Plan Comments:        Anesthesia Quick Evaluation

## 2018-10-31 ENCOUNTER — Ambulatory Visit (HOSPITAL_COMMUNITY): Payer: Medicare Other | Admitting: Anesthesiology

## 2018-10-31 ENCOUNTER — Ambulatory Visit (HOSPITAL_COMMUNITY)
Admission: RE | Admit: 2018-10-31 | Discharge: 2018-10-31 | Disposition: A | Discharge status: Home / Self Care | Payer: Medicare Other | Attending: Cardiovascular Disease | Admitting: Cardiovascular Disease

## 2018-10-31 ENCOUNTER — Encounter (HOSPITAL_COMMUNITY): Payer: Self-pay

## 2018-10-31 ENCOUNTER — Encounter (HOSPITAL_COMMUNITY)
Admission: RE | Disposition: A | Discharge status: Home / Self Care | Payer: Self-pay | Source: Home / Self Care | Attending: Cardiovascular Disease

## 2018-10-31 DIAGNOSIS — I4819 Other persistent atrial fibrillation: Secondary | ICD-10-CM

## 2018-10-31 DIAGNOSIS — I10 Essential (primary) hypertension: Secondary | ICD-10-CM | POA: Insufficient documentation

## 2018-10-31 DIAGNOSIS — Z886 Allergy status to analgesic agent status: Secondary | ICD-10-CM | POA: Diagnosis not present

## 2018-10-31 DIAGNOSIS — I4891 Unspecified atrial fibrillation: Secondary | ICD-10-CM | POA: Diagnosis not present

## 2018-10-31 DIAGNOSIS — Z8619 Personal history of other infectious and parasitic diseases: Secondary | ICD-10-CM | POA: Insufficient documentation

## 2018-10-31 DIAGNOSIS — E785 Hyperlipidemia, unspecified: Secondary | ICD-10-CM | POA: Insufficient documentation

## 2018-10-31 DIAGNOSIS — Z7901 Long term (current) use of anticoagulants: Secondary | ICD-10-CM | POA: Diagnosis not present

## 2018-10-31 DIAGNOSIS — Z8249 Family history of ischemic heart disease and other diseases of the circulatory system: Secondary | ICD-10-CM | POA: Insufficient documentation

## 2018-10-31 DIAGNOSIS — Z79899 Other long term (current) drug therapy: Secondary | ICD-10-CM | POA: Diagnosis not present

## 2018-10-31 DIAGNOSIS — Z8673 Personal history of transient ischemic attack (TIA), and cerebral infarction without residual deficits: Secondary | ICD-10-CM | POA: Insufficient documentation

## 2018-10-31 DIAGNOSIS — I639 Cerebral infarction, unspecified: Secondary | ICD-10-CM | POA: Diagnosis not present

## 2018-10-31 HISTORY — PX: CARDIOVERSION: SHX1299

## 2018-10-31 SURGERY — CARDIOVERSION
Anesthesia: General | Laterality: Left

## 2018-10-31 MED ORDER — PROPOFOL 10 MG/ML IV BOLUS
INTRAVENOUS | Status: DC | PRN
  Administered 2018-10-31: 40 mg via INTRAVENOUS

## 2018-10-31 MED ORDER — SODIUM CHLORIDE 0.9 % IV SOLN
INTRAVENOUS | Status: DC
  Administered 2018-10-31: 08:00:00 via INTRAVENOUS

## 2018-10-31 MED ORDER — LIDOCAINE 2% (20 MG/ML) 5 ML SYRINGE
INTRAMUSCULAR | Status: DC | PRN
  Administered 2018-10-31: 20 mg via INTRAVENOUS

## 2018-10-31 NOTE — Discharge Instructions (Signed)

## 2018-10-31 NOTE — Anesthesia Postprocedure Evaluation (Signed)
Anesthesia Post Note  Patient: Mary Mercado  Procedure(s) Performed: CARDIOVERSION (Left )     Patient location during evaluation: PACU Anesthesia Type: General Level of consciousness: awake and alert Pain management: pain level controlled Vital Signs Assessment: post-procedure vital signs reviewed and stable Respiratory status: spontaneous breathing, nonlabored ventilation, respiratory function stable and patient connected to nasal cannula oxygen Cardiovascular status: blood pressure returned to baseline and stable Postop Assessment: no apparent nausea or vomiting Anesthetic complications: no    Last Vitals:  Vitals:   10/31/18 0920 10/31/18 0930  BP: 134/83 (!) 143/77  Pulse: (!) 57 62  Resp: (!) 24 (!) 21  Temp:    SpO2: 100% 97%    Last Pain:  Vitals:   10/31/18 0930  TempSrc:   PainSc: 0-No pain                 Tiajuana Amass

## 2018-10-31 NOTE — Transfer of Care (Addendum)
Immediate Anesthesia Transfer of Care Note  Patient: Mary Mercado  Procedure(s) Performed: CARDIOVERSION (Left )  Patient Location: Endoscopy Unit  Anesthesia Type:General  Level of Consciousness: drowsy  Airway & Oxygen Therapy: Patient Spontanous Breathing  Post-op Assessment: Report given to RN and Post -op Vital signs reviewed and stable  Post vital signs: Reviewed and stable  Last Vitals:  Vitals Value Taken Time  BP    Temp    Pulse    Resp    SpO2      Last Pain:  Vitals:   10/31/18 0819  TempSrc: Oral  PainSc: 0-No pain         Complications: No apparent anesthesia complications

## 2018-10-31 NOTE — Interval H&P Note (Signed)
History and Physical Interval Note:  10/31/2018 8:56 AM  Mary Mercado  has presented today for surgery, with the diagnosis of ATRIAL FIBRILLATION.  The various methods of treatment have been discussed with the patient and family. After consideration of risks, benefits and other options for treatment, the patient has consented to  Procedure(s): CARDIOVERSION (Left) as a surgical intervention.  The patient's history has been reviewed, patient examined, no change in status, stable for surgery.  I have reviewed the patient's chart and labs.  Questions were answered to the patient's satisfaction.     Mertie Moores

## 2018-10-31 NOTE — CV Procedure (Signed)
    Cardioversion Note  Mary Mercado [id number] 08/04/1940  Procedure: DC Cardioversion Indications: atrial fib   Procedure Details Consent: Obtained Time Out: Verified patient identification, verified procedure, site/side was marked, verified correct patient position, special equipment/implants available, Radiology Safety Procedures followed,  medications/allergies/relevent history reviewed, required imaging and test results available.  Performed  The patient has been on adequate anticoagulation.  The patient received IV  Lidocaine 20 mg IV followed by Propofol 40 mg IV  for sedation.  Synchronous cardioversion was performed at  120  joules.  The cardioversion was successful     Complications: No apparent complications Patient did tolerate procedure well.   Thayer Headings, Brooke Bonito., MD, [facility] 10/31/2018, 9:36 AM

## 2018-11-13 ENCOUNTER — Other Ambulatory Visit: Payer: Self-pay

## 2018-11-13 ENCOUNTER — Encounter: Payer: Self-pay | Admitting: Physical Medicine & Rehabilitation

## 2018-11-13 ENCOUNTER — Encounter: Payer: Medicare Other | Attending: Physical Medicine & Rehabilitation | Admitting: Physical Medicine & Rehabilitation

## 2018-11-13 VITALS — BP 174/83 | HR 55 | Temp 97.0°F | Ht 65.0 in | Wt 140.0 lb

## 2018-11-13 DIAGNOSIS — I63511 Cerebral infarction due to unspecified occlusion or stenosis of right middle cerebral artery: Secondary | ICD-10-CM | POA: Insufficient documentation

## 2018-11-13 DIAGNOSIS — I69998 Other sequelae following unspecified cerebrovascular disease: Secondary | ICD-10-CM

## 2018-11-13 DIAGNOSIS — R209 Unspecified disturbances of skin sensation: Secondary | ICD-10-CM | POA: Diagnosis not present

## 2018-11-13 DIAGNOSIS — E785 Hyperlipidemia, unspecified: Secondary | ICD-10-CM | POA: Diagnosis not present

## 2018-11-13 DIAGNOSIS — R269 Unspecified abnormalities of gait and mobility: Secondary | ICD-10-CM

## 2018-11-13 DIAGNOSIS — I69398 Other sequelae of cerebral infarction: Secondary | ICD-10-CM | POA: Diagnosis not present

## 2018-11-13 NOTE — Progress Notes (Signed)
Subjective:    Patient ID: Mary Mercado, female    DOB: May 10, 1940, 78 y.o.   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  Underwent successful  DC cardioversion, no further fast HRs (was up to 150bpm at times)  BP at home this am 143/73. 174/83 in office, doesn't think she has white coat syndrome but feels nervous at times  Abnormal numb sensation Left UE and LLE , occ pain.  Doesn't cause  insomnia Pain Inventory Average Pain 0 Pain Right Now 0 My pain is na  In the last 24 hours, has pain interfered with the following? General activity 0 Relation with others 0 Enjoyment of life 0 What TIME of day is your pain at its worst? na Sleep (in general) na  Pain is worse with: na Pain improves with: na Relief from Meds: na  Mobility walk with assistance  Function retired  Neuro/Psych numbness tingling  Prior Studies Any changes since last visit?  no  Physicians involved in your care Any changes since last visit?  no   Family History  Problem Relation Age of Onset  . Colon cancer Father   . Diabetes Mother   . Heart attack Mother        75  . Heart attack Brother 24   Social History   Socioeconomic History  . Marital status: Married    Spouse name: Not on file  . Number of children: Not on file  . Years of education: Not on file  . Highest education level: Not on file  Occupational History  . Not on file  Social Needs  . Financial resource strain: Not on file  . Food insecurity    Worry: Not on file    Inability: Not on file  . Transportation needs    Medical: Not on file    Non-medical: Not on file  Tobacco Use  . Smoking status: Never Smoker  . Smokeless tobacco: Never Used  Substance and Sexual Activity  . Alcohol use: No  . Drug use: No  . Sexual activity: Not on file  Lifestyle  . Physical activity    Days per week: Not on file    Minutes per session: Not on file  . Stress: Not on file   Relationships  . Social Herbalist on phone: Not on file    Gets together: Not on file    Attends religious service: Not on file    Active member of club or organization: Not on file    Attends meetings of clubs or organizations: Not on file    Relationship status: Not on file  Other Topics Concern  . Not on file  Social History Narrative  . Not on file   Past Surgical History:  Procedure Laterality Date  . CARDIOVERSION Left 10/31/2018   Procedure: CARDIOVERSION;  Surgeon: Acie Fredrickson Wonda Cheng, MD;  Location: [facility] ENDOSCOPY;  Service: Cardiovascular;  Laterality: Left;  . Hemilaminectomy and microdiskectomy at L4-5 on the left.  12/10/2003  . TEE WITHOUT CARDIOVERSION N/A 06/05/2018   Procedure: TRANSESOPHAGEAL ECHOCARDIOGRAM (TEE);  Surgeon: Lelon Perla, MD;  Location: [facility] ENDOSCOPY;  Service: Cardiovascular;  Laterality: N/A;  loop   Past Medical History:  Diagnosis Date  . Allergy   . CVA (cerebral vascular accident) ([place])   . Hyperlipidemia LDL goal <70   . Hypertension   . Palpitations   . Vertigo, benign positional    BP Marland Kitchen)  174/83   Pulse (!) 55   Temp (!) 97 F (36.1 C)   Ht 5\' 5"  (1.651 m)   Wt 140 lb (63.5 kg)   SpO2 99%   BMI 23.30 kg/m   Opioid Risk Score:   Fall Risk Score:  `1  Depression screen PHQ 2/9  Depression screen [facility] 2/9 09/20/2018 06/14/2017  Decreased Interest 0 0  Down, Depressed, Hopeless 1 0  PHQ - 2 Score 1 0     Review of Systems  Constitutional: Negative.   HENT: Negative.   Eyes: Negative.   Respiratory: Negative.   Cardiovascular: Negative.   Gastrointestinal: Negative.   Endocrine: Negative.   Genitourinary: Negative.   Musculoskeletal: Negative.   Skin: Negative.   Allergic/Immunologic: Negative.   Neurological: Positive for numbness.  Hematological: Negative.   Psychiatric/Behavioral: Negative.   All other systems reviewed and are negative.      Objective:   Physical Exam  Motor strength is 5/5 in the  right deltoid bicep tricep grip hip flexor knee extensor ankle dorsiflexor Left side is 4+ in the left deltoid bicep tricep grip 5/5 in left hip flexor knee extensor ankle dorsiflexor Sensation is intact to light touch in bilateral upper and lower limbs.  She does note some difference in the quality of sensation on the left side versus right side. There is mild tactile extinction in the left lower extremity compared to the right side.      Assessment & Plan:  1.  History of right MCA distribution infarct has had an excellent recovery and is back to her baseline.  The patient did not complete her outpatient therapy however in the interval time she has made some excellent improvements just with home exercise program.  She is satisfied with her functional level and feels she can do everything that she used to be doing prior to stroke.  Therefore will not make a referral to outpatient therapy.  I will see her back on a as needed basis She will follow-up with neurology next week she will follow-up with cardiology next month as well as her primary care physician Dr. Sarajane Jews

## 2018-11-15 DIAGNOSIS — L570 Actinic keratosis: Secondary | ICD-10-CM | POA: Diagnosis not present

## 2018-11-15 DIAGNOSIS — L814 Other melanin hyperpigmentation: Secondary | ICD-10-CM | POA: Diagnosis not present

## 2018-11-15 DIAGNOSIS — D1801 Hemangioma of skin and subcutaneous tissue: Secondary | ICD-10-CM | POA: Diagnosis not present

## 2018-11-15 DIAGNOSIS — D485 Neoplasm of uncertain behavior of skin: Secondary | ICD-10-CM | POA: Diagnosis not present

## 2018-11-15 DIAGNOSIS — Z85828 Personal history of other malignant neoplasm of skin: Secondary | ICD-10-CM | POA: Diagnosis not present

## 2018-11-15 DIAGNOSIS — C44619 Basal cell carcinoma of skin of left upper limb, including shoulder: Secondary | ICD-10-CM | POA: Diagnosis not present

## 2018-11-15 DIAGNOSIS — L821 Other seborrheic keratosis: Secondary | ICD-10-CM | POA: Diagnosis not present

## 2018-11-15 DIAGNOSIS — C44729 Squamous cell carcinoma of skin of left lower limb, including hip: Secondary | ICD-10-CM | POA: Diagnosis not present

## 2018-11-19 ENCOUNTER — Other Ambulatory Visit: Payer: Self-pay

## 2018-11-19 ENCOUNTER — Encounter: Payer: Self-pay | Admitting: Neurology

## 2018-11-19 ENCOUNTER — Ambulatory Visit (INDEPENDENT_AMBULATORY_CARE_PROVIDER_SITE_OTHER): Payer: Medicare Other | Admitting: Neurology

## 2018-11-19 VITALS — BP 136/79 | HR 59 | Temp 98.2°F | Ht 65.0 in | Wt 137.0 lb

## 2018-11-19 DIAGNOSIS — I63511 Cerebral infarction due to unspecified occlusion or stenosis of right middle cerebral artery: Secondary | ICD-10-CM

## 2018-11-19 DIAGNOSIS — R202 Paresthesia of skin: Secondary | ICD-10-CM

## 2018-11-19 NOTE — Patient Instructions (Signed)
I had a long d/w patient about her recent stroke atrial fibrillation,,post stroke paresthesias, risk for recurrent stroke/TIAs, personally independently reviewed imaging studies and stroke evaluation results and answered questions.Continue Eliquis (apixaban) daily  for secondary stroke prevention and maintain strict control of hypertension with blood pressure goal below 130/90, diabetes with hemoglobin A1c goal below 6.5% and lipids with LDL cholesterol goal below 70 mg/dL. I also advised the patient to eat a healthy diet with plenty of whole grains, cereals, fruits and vegetables, exercise regularly and maintain ideal body weight Followup in the future with my nurse practitioner Janett Billow in 6 months or call earlier if necessary.   Stroke Prevention Some medical conditions and behaviors are associated with a higher chance of having a stroke. You can help prevent a stroke by making nutrition, lifestyle, and other changes, including managing any medical conditions you may have. What nutrition changes can be made?   Eat healthy foods. You can do this by: ? Choosing foods high in fiber, such as fresh fruits and vegetables and whole grains. ? Eating at least 5 or more servings of fruits and vegetables a day. Try to fill half of your plate at each meal with fruits and vegetables. ? Choosing lean protein foods, such as lean cuts of meat, poultry without skin, fish, tofu, beans, and nuts. ? Eating low-fat dairy products. ? Avoiding foods that are high in salt (sodium). This can help lower blood pressure. ? Avoiding foods that have saturated fat, trans fat, and cholesterol. This can help prevent high cholesterol. ? Avoiding processed and premade foods.  Follow your health care provider's specific guidelines for losing weight, controlling high blood pressure (hypertension), lowering high cholesterol, and managing diabetes. These may include: ? Reducing your daily calorie intake. ? Limiting your daily sodium  intake to 1,500 milligrams (mg). ? Using only healthy fats for cooking, such as olive oil, canola oil, or sunflower oil. ? Counting your daily carbohydrate intake. What lifestyle changes can be made?  Maintain a healthy weight. Talk to your health care provider about your ideal weight.  Get at least 30 minutes of moderate physical activity at least 5 days a week. Moderate activity includes brisk walking, biking, and swimming.  Do not use any products that contain nicotine or tobacco, such as cigarettes and e-cigarettes. If you need help quitting, ask your health care provider. It may also be helpful to avoid exposure to secondhand smoke.  Limit alcohol intake to no more than 1 drink a day for nonpregnant women and 2 drinks a day for men. One drink equals 12 oz of beer, 5 oz of wine, or 1 oz of hard liquor.  Stop any illegal drug use.  Avoid taking birth control pills. Talk to your health care provider about the risks of taking birth control pills if: ? You are over 53 years old. ? You smoke. ? You get migraines. ? You have ever had a blood clot. What other changes can be made?  Manage your cholesterol levels. ? Eating a healthy diet is important for preventing high cholesterol. If cholesterol cannot be managed through diet alone, you may also need to take medicines. ? Take any prescribed medicines to control your cholesterol as told by your health care provider.  Manage your diabetes. ? Eating a healthy diet and exercising regularly are important parts of managing your blood sugar. If your blood sugar cannot be managed through diet and exercise, you may need to take medicines. ? Take any prescribed medicines to  control your diabetes as told by your health care provider.  Control your hypertension. ? To reduce your risk of stroke, try to keep your blood pressure below 130/80. ? Eating a healthy diet and exercising regularly are an important part of controlling your blood pressure. If  your blood pressure cannot be managed through diet and exercise, you may need to take medicines. ? Take any prescribed medicines to control hypertension as told by your health care provider. ? Ask your health care provider if you should monitor your blood pressure at home. ? Have your blood pressure checked every year, even if your blood pressure is normal. Blood pressure increases with age and some medical conditions.  Get evaluated for sleep disorders (sleep apnea). Talk to your health care provider about getting a sleep evaluation if you snore a lot or have excessive sleepiness.  Take over-the-counter and prescription medicines only as told by your health care provider. Aspirin or blood thinners (antiplatelets or anticoagulants) may be recommended to reduce your risk of forming blood clots that can lead to stroke.  Make sure that any other medical conditions you have, such as atrial fibrillation or atherosclerosis, are managed. What are the warning signs of a stroke? The warning signs of a stroke can be easily remembered as BEFAST.  B is for balance. Signs include: ? Dizziness. ? Loss of balance or coordination. ? Sudden trouble walking.  E is for eyes. Signs include: ? A sudden change in vision. ? Trouble seeing.  F is for face. Signs include: ? Sudden weakness or numbness of the face. ? The face or eyelid drooping to one side.  A is for arms. Signs include: ? Sudden weakness or numbness of the arm, usually on one side of the body.  S is for speech. Signs include: ? Trouble speaking (aphasia). ? Trouble understanding.  T is for time. ? These symptoms may represent a serious problem that is an emergency. Do not wait to see if the symptoms will go away. Get medical help right away. Call your local emergency services (911 in the [place] Do not drive yourself to the hospital.  Other signs of stroke may include: ? A sudden, severe headache with no known cause. ? Nausea or  vomiting. ? Seizure. Where to find more information For more information, visit:  American Stroke Association: www.strokeassociation.org  National Stroke Association: www.stroke.org Summary  You can prevent a stroke by eating healthy, exercising, not smoking, limiting alcohol intake, and managing any medical conditions you may have.  Do not use any products that contain nicotine or tobacco, such as cigarettes and e-cigarettes. If you need help quitting, ask your health care provider. It may also be helpful to avoid exposure to secondhand smoke.  Remember BEFAST for warning signs of stroke. Get help right away if you or a loved one has any of these signs. This information is not intended to replace advice given to you by your health care provider. Make sure you discuss any questions you have with your health care provider. Document Released: 05/19/2004 Document Revised: 03/24/2017 Document Reviewed: 05/17/2016 Elsevier Patient Education  2020 [employer].

## 2018-11-19 NOTE — Progress Notes (Signed)
[facility] [address] [place]. [place] [address] [phone]       OFFICE FOLLOW-UP NOTE  Ms. Joscelyn Hardrick Date of Birth:  07/12/1941 Medical Record Number:  [id number]   HPI: Initial video visit 08/13/2018 : This is a initial video virtual consultation visit on Ms. Riederer who was admitted to [facility] in February 2020 with a stroke.  I have personally obtained history of presenting illness from the patient and her husband and reviewed electronic medical records as well as imaging films in PACS.  She was admitted on 05/31/2018 with sudden onset of left arm and leg weakness and numbness as well as feeling dizzy and had one episode of emesis.  She had CT scan and CT angiogram in the ER which showed no large vessel occlusion she was given IV TPA and admitted to the neurological intensive care unit.  NIH stroke scale on admission was 4.  At baseline modified Rankin score was 0.  Patient had tight blood pressure control.  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  Transthoracic echo showed normal ejection fraction.  Transesophageal echocardiogram showed no cardiac source of embolism or PFO.  LDL cholesterol was elevated at 1 1 7  mg percent.  Hemoglobin A1c was 5.9.  Urine drug screen was negative.  Lower extremity venous Dopplers were negative for DVT.  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  Patient was started on aspirin 81 and Plavix 75 mg daily for 3 weeks and underwent an outpatient 30-day heart monitor which subsequently showed paroxysmal atrial fibrillation.  She has since then stopped aspirin and Plavix and has been switched to Eliquis.  Patient states she has done well since discharge.   She was initially transferred to inpatient rehab where she stayed for a few weeks and did well.  She is still has left-sided numbness but feels some of the sensation may be coming back now.  She can walk independently without assistance though her husband stays close by.  She has had no falls or injuries.  She is tolerating Eliquis well without bleeding or bruising.  She is also tolerating Lipitor well.  She complains of little bit of jitteriness and tiredness but she blames this likely on her new A. fib medication.  She has no new complaints.  She has no prior history of strokes TIAs.  Update 11/19/2018 : She is seen today for office follow-up visit (78 years old) following initial video follow-up visit on 08/13/2018.  She is accompanied by her husband.  She states she continues to have left leg paresthesias and numbness.  She has some mild gait and balance difficulties.  At times she noticed that the legs are trembling and she is initiating walk and she has to hold on something and the feeling goes away.  She has also been started on several new cardiac medications per her ablation for A. fib and feels medication may be the cause for her dizziness.  She is tolerating Eliquis well without bruising or bleeding.  Blood pressures well controlled and today it is 136/79.  She remains on Lipitor which is tolerating well without muscle aches and pains.  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  ROS:   14 system review of systems is positive for numbness, dizziness, leg tremors and gait difficulty and all other systems negative  PMH:  Past Medical History:  Diagnosis Date   Allergy    CVA (cerebral vascular accident) ([place])    Hyperlipidemia LDL goal <70    Hypertension    Palpitations    Vertigo, benign positional     Social History:  Social History   Socioeconomic History   Marital status: Married    Spouse name: Not on file    Number of children: Not on file   Years of education: Not on file   Highest education level: Not on file  Occupational History   Not on file  Social Needs   Financial resource strain: Not on file   Food insecurity    Worry: Not on file    Inability: Not on file   Transportation needs    Medical: Not on file    Non-medical: Not on file  Tobacco Use   Smoking status: Never Smoker   Smokeless tobacco: Never Used  Substance and Sexual Activity   Alcohol use: No   Drug use: No   Sexual activity: Not on file  Lifestyle   Physical activity    Days per week: Not on file    Minutes per session: Not on file   Stress: Not on file  Relationships   Social connections    Talks on phone: Not on file    Gets together: Not on file    Attends religious service: Not on file    Active member of club or organization: Not on file    Attends meetings of clubs or organizations: Not on file    Relationship status: Not on file   Intimate partner violence    Fear of current or ex partner: Not on file    Emotionally abused: Not on file    Physically abused: Not on file    Forced sexual activity: Not on file  Other Topics Concern   Not on file  Social History Narrative   Not on file    Medications:   Current Outpatient Medications on File Prior to Visit  Medication Sig Dispense Refill   atorvastatin (LIPITOR) 40 MG tablet Take 1 tablet (40 mg total) by mouth daily at 6 PM. 90 tablet 3   diltiazem (CARDIZEM CD) 180 MG 24 hr capsule Take 1 capsule (180 mg total) by mouth daily. 30 capsule 11   diltiazem (CARDIZEM) 30 MG tablet Take 1 tablet (30 mg total) by mouth 3 (three) times daily as needed (fast heart rate). 60 tablet 11   ELIQUIS 5 MG TABS tablet TAKE 1 TABLET BY MOUTH TWICE A DAY (Patient taking differently: Take 5 mg by mouth 2 (two) times daily. ) 60 tablet 6   metoprolol succinate (TOPROL-XL) 25 MG 24 hr tablet Take 25 mg by mouth daily. Take one 25 mg tablet  with 50 mg tablet by mouth daily. Total of 75 mg daily.     metoprolol succinate (TOPROL-XL) 50 MG 24 hr tablet Take 50 mg by mouth daily. Take with or immediately following a meal with the 25 mg tablet.     pantoprazole (PROTONIX) 40 MG tablet Take 1 tablet (40 mg total) by mouth daily. 90 tablet 3   No current facility-administered medications on file prior to visit.     Allergies:   Allergies  Allergen Reactions   Acetaminophen Hives and Rash    Physical Exam  General: frail elderly Caucasian lady seated, in no evident distress Head: head normocephalic and atraumatic.  Neck: supple with no carotid or supraclavicular bruits Cardiovascular: regular rate and rhythm, no murmurs Musculoskeletal: no deformity Skin:  no rash/petichiae Vascular:  Normal pulses all extremities Vitals:   11/19/18 1255  BP: 136/79  Pulse: (!) 59  Temp: 98.2 F (36.8 C)   Neurologic Exam Mental Status: Awake and fully alert. Oriented to place and time. Recent and remote memory intact. Attention span, concentration and fund of knowledge appropriate. Mood and affect appropriate.  Cranial Nerves: Fundoscopic exam reveals sharp disc margins. Pupils equal, briskly reactive to light. Extraocular movements full without nystagmus. Visual fields full to confrontation. Hearing intact. Facial sensation intact. Face, tongue, palate moves normally and symmetrically.  Motor: Normal bulk and tone. Normal strength in all tested extremity muscles. Sensory.: diminished  touch ,pinprick .position and vibratory sensation in LLE from ankle down. Positive Rhomberg`s test Coordination: Rapid alternating movements normal in all extremities. Finger-to-nose and heel-to-shin performed accurately bilaterally. Gait and Station: Arises from chair without difficulty. Stance is normal. Gait demonstrates normal stride length and balance . Not able to heel, toe and tandem walk without difficulty.  Reflexes: 1+ and symmetric. Toes  downgoing.   NIHSS  1 Modified Rankin 2  ASSESSMENT: 78 year old Caucasian lady with embolic right MCA infarct in February 2020 secondary to paroxysmal atrial fibrillation.  Vascular risk factors of hypertension, hyperlipidemia, A. fib and age.  She is doing well with only mild residual left-sided sensory loss and paresthesias.Marland Kitchen    PLAN: I had a long d/w patient about her recent stroke atrial fibrillation,,post stroke paresthesias, risk for recurrent stroke/TIAs, personally independently reviewed imaging studies and stroke evaluation results and answered questions.Continue Eliquis (apixaban) daily  for secondary stroke prevention and maintain strict control of hypertension with blood pressure goal below 130/90, diabetes with hemoglobin A1c goal below 6.5% and lipids with LDL cholesterol goal below 70 mg/dL. I also advised the patient to eat a healthy diet with plenty of whole grains, cereals, fruits and vegetables, exercise regularly and maintain ideal body weight Followup in the future with my nurse practitioner Janett Billow in 6 months or call earlier if necessary. Greater than 50% of time during this 25 minute visit was spent on counseling,explanation of diagnosis, planning of further management, discussion with patient and family and coordination of care Antony Contras, MD  [facility] [address] [place], [place] [address]  Phone [phone] Fax [phone] Note: This document was prepared with digital dictation and possible smart phrase technology. Any transcriptional errors that result from this process are unintentional

## 2018-11-30 ENCOUNTER — Ambulatory Visit (INDEPENDENT_AMBULATORY_CARE_PROVIDER_SITE_OTHER): Payer: Medicare Other | Admitting: Cardiovascular Disease

## 2018-11-30 ENCOUNTER — Encounter: Payer: Self-pay | Admitting: Cardiovascular Disease

## 2018-11-30 ENCOUNTER — Other Ambulatory Visit: Payer: Self-pay

## 2018-11-30 VITALS — BP 138/84 | HR 66 | Ht 65.0 in | Wt 136.8 lb

## 2018-11-30 DIAGNOSIS — I4819 Other persistent atrial fibrillation: Secondary | ICD-10-CM

## 2018-11-30 DIAGNOSIS — R42 Dizziness and giddiness: Secondary | ICD-10-CM | POA: Diagnosis not present

## 2018-11-30 DIAGNOSIS — I63511 Cerebral infarction due to unspecified occlusion or stenosis of right middle cerebral artery: Secondary | ICD-10-CM

## 2018-11-30 NOTE — Progress Notes (Signed)
Cardiology Office Note:    Date:  11/30/2018   ID:  [facility] Mary Mercado, Mary Mercado 02-04-1941, MRN [id number]  PCP:  Mary Morale, MD  Cardiologist:  Mary Moores, MD  Electrophysiologist:  None   Referring MD: Mary Morale, MD    Problem List 1. Atrial Fib:    CHADS2VASC =  5   ( female, age 78, CVA Chief Complaint  Patient presents with  . Atrial Fibrillation    Aug. 7, 2020     Mary Mercado is a 78 y.o. female with a hx of rapid atrial fib.    We cardioverted her on July 7. Is doing great.   Is having some dizziness.  Constant.  Not related to orthostatic.  Is not dizzy when she is lying down      Thinks its due to the metoprolol    Past Medical History:  Diagnosis Date  . Allergy   . CVA (cerebral vascular accident) ([place])   . Hyperlipidemia LDL goal <70   . Hypertension   . Palpitations   . Vertigo, benign positional     Past Surgical History:  Procedure Laterality Date  . CARDIOVERSION Left 10/31/2018   Procedure: CARDIOVERSION;  Surgeon: Mary Fredrickson Wonda Cheng, MD;  Location: [facility] ENDOSCOPY;  Service: Cardiovascular;  Laterality: Left;  . Hemilaminectomy and microdiskectomy at L4-5 on the left.  12/10/2003  . TEE WITHOUT CARDIOVERSION N/A 06/05/2018   Procedure: TRANSESOPHAGEAL ECHOCARDIOGRAM (TEE);  Surgeon: Lelon Perla, MD;  Location: [facility] ENDOSCOPY;  Service: Cardiovascular;  Laterality: N/A;  loop    Current Medications: Current Meds  Medication Sig  . apixaban (ELIQUIS) 5 MG TABS tablet Take 5 mg by mouth 2 (two) times daily.  Marland Kitchen atorvastatin (LIPITOR) 40 MG tablet Take 1 tablet (40 mg total) by mouth daily at 6 PM.  . diltiazem (CARDIZEM CD) 180 MG 24 hr capsule Take 1 capsule (180 mg total) by mouth daily.  Marland Kitchen diltiazem (CARDIZEM) 30 MG tablet Take 1 tablet (30 mg total) by mouth 3 (three) times daily as needed (fast heart rate).  . pantoprazole (PROTONIX) 40 MG tablet Take 1 tablet (40 mg total) by mouth daily.  . [DISCONTINUED] metoprolol  succinate (TOPROL-XL) 25 MG 24 hr tablet Take 25 mg by mouth daily. Take one 25 mg tablet with 50 mg tablet by mouth daily. Total of 75 mg daily.  . [DISCONTINUED] metoprolol succinate (TOPROL-XL) 50 MG 24 hr tablet Take 50 mg by mouth daily. Take with or immediately following a meal with the 25 mg tablet.     Allergies:   Acetaminophen   Social History   Socioeconomic History  . Marital status: Married    Spouse name: Not on file  . Number of children: Not on file  . Years of education: Not on file  . Highest education level: Not on file  Occupational History  . Not on file  Social Needs  . Financial resource strain: Not on file  . Food insecurity    Worry: Not on file    Inability: Not on file  . Transportation needs    Medical: Not on file    Non-medical: Not on file  Tobacco Use  . Smoking status: Never Smoker  . Smokeless tobacco: Never Used  Substance and Sexual Activity  . Alcohol use: No  . Drug use: No  . Sexual activity: Not on file  Lifestyle  . Physical activity    Days per week: Not on file    Minutes  per session: Not on file  . Stress: Not on file  Relationships  . Social Herbalist on phone: Not on file    Gets together: Not on file    Attends religious service: Not on file    Active member of club or organization: Not on file    Attends meetings of clubs or organizations: Not on file    Relationship status: Not on file  Other Topics Concern  . Not on file  Social History Narrative  . Not on file     Family History: The patient's family history includes Colon cancer in her father; Diabetes in her mother; Heart attack in her mother; Heart attack (age of onset: 39) in her brother.  ROS:   Please see the history of present illness.     All other systems reviewed and are negative.  EKGs/Labs/Other Studies Reviewed:    The following studies were reviewed today:   EKG:    Recent Labs: 06/06/2018: ALT 24 10/25/2018: BUN 15; Creatinine,  Ser 0.84; Hemoglobin 15.2; Platelets 294; Potassium 4.1; Sodium 140  Recent Lipid Panel    Component Value Date/Time   CHOL 243 (H) 06/01/2018 0548   TRIG 80 06/01/2018 0548   HDL 110 06/01/2018 0548   CHOLHDL 2.2 06/01/2018 0548   VLDL 16 06/01/2018 0548   LDLCALC 117 (H) 06/01/2018 0548    Physical Exam:    VS:  BP 138/84   Pulse 66   Ht 5\' 5"  (1.651 m)   Wt 136 lb 12.8 oz (62.1 kg)   SpO2 98%   BMI 22.76 kg/m     Wt Readings from Last 3 Encounters:  11/30/18 136 lb 12.8 oz (62.1 kg)  11/19/18 137 lb (62.1 kg)  11/13/18 140 lb (63.5 kg)     GEN:  Well nourished, well developed in no acute distress HEENT: Normal NECK: No JVD; No carotid bruits LYMPHATICS: No lymphadenopathy CARDIAC: RRR, no murmurs, rubs, gallops RESPIRATORY:  Clear to auscultation without rales, wheezing or rhonchi  ABDOMEN: Soft, non-tender, non-distended MUSCULOSKELETAL:  No edema; No deformity  SKIN: Warm and dry NEUROLOGIC:  Alert and oriented x 3 PSYCHIATRIC:  Normal affect   ASSESSMENT:    No diagnosis found. PLAN:    In order of problems listed above:  1. Atrial fibrillation: [name] has remained in sinus rhythm.  She seems to be doing well but has had some dizziness.  We will discontinue her metoprolol.  Continue with the Cardizem CD.  Continue with Eliquis.  She is had a stroke in the past and I reminded her that she will need to take Eliquis for the rest of her life.  We discussed what to do if she happens to need some surgery.  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  I given her the okay to hold her Eliquis for 2 days prior to such procedures but I did say that we would be giving cardiac clearance for such procedures.  2.  Dizziness: Her heart rate has been slow on occasion.  She has been measuring her heart rate and occasionally finds it in the 40s to 50s.  We will discontinue the metoprolol.  Continue Cardizem.  I will see her in 3 months for  follow-up visit.   Medication Adjustments/Labs and Tests Ordered: Current medicines are reviewed at length with the patient today.  Concerns regarding medicines are outlined above.  No orders of the defined types  were placed in this encounter.  No orders of the defined types were placed in this encounter.   Patient Instructions  Medication Instructions:  Your physician has recommended you make the following change in your medication:  STOP Metoprolol    If you need a refill on your cardiac medications before your next appointment, please call your pharmacy.    Lab work: None Ordered   Testing/Procedures: None Ordered   Follow-Up: At [facility], you and your health needs are our priority.  As part of our continuing mission to provide you with exceptional heart care, we have created designated Provider Care Teams.  These Care Teams include your primary Cardiologist (physician) and Advanced Practice Providers (APPs -  Physician Assistants and Nurse Practitioners) who all work together to provide you with the care you need, when you need it. You will need a follow up appointment in:  3 months on  Monday Nov. 9 at 9:00 am with Mary Moores, MD. In the future, you may see one of the following Advanced Practice Providers on your designated Care Team: Richardson Dopp, PA-C [place], [place] . Daune Perch, NP      Signed, Mary Moores, MD  11/30/2018 6:06 PM    [place]

## 2018-11-30 NOTE — Patient Instructions (Addendum)
Medication Instructions:  Your physician has recommended you make the following change in your medication:  STOP Metoprolol    If you need a refill on your cardiac medications before your next appointment, please call your pharmacy.    Lab work: None Ordered   Testing/Procedures: None Ordered   Follow-Up: At [facility], you and your health needs are our priority.  As part of our continuing mission to provide you with exceptional heart care, we have created designated Provider Care Teams.  These Care Teams include your primary Cardiologist (physician) and Advanced Practice Providers (APPs -  Physician Assistants and Nurse Practitioners) who all work together to provide you with the care you need, when you need it. You will need a follow up appointment in:  3 months on  Monday Nov. 9 at 9:00 am with Mertie Moores, MD. In the future, you may see one of the following Advanced Practice Providers on your designated Care Team: Richardson Dopp, PA-C [place], [place] . Daune Perch, NP

## 2018-12-24 ENCOUNTER — Ambulatory Visit (INDEPENDENT_AMBULATORY_CARE_PROVIDER_SITE_OTHER): Payer: Medicare Other | Admitting: Family Medicine

## 2018-12-24 ENCOUNTER — Encounter: Payer: Self-pay | Admitting: Family Medicine

## 2018-12-24 ENCOUNTER — Telehealth: Payer: Self-pay | Admitting: Family Medicine

## 2018-12-24 ENCOUNTER — Other Ambulatory Visit: Payer: Self-pay

## 2018-12-24 VITALS — BP 150/80 | HR 94 | Temp 97.5°F | Wt 137.6 lb

## 2018-12-24 DIAGNOSIS — I63511 Cerebral infarction due to unspecified occlusion or stenosis of right middle cerebral artery: Secondary | ICD-10-CM

## 2018-12-24 DIAGNOSIS — E785 Hyperlipidemia, unspecified: Secondary | ICD-10-CM | POA: Diagnosis not present

## 2018-12-24 DIAGNOSIS — I10 Essential (primary) hypertension: Secondary | ICD-10-CM

## 2018-12-24 DIAGNOSIS — R7303 Prediabetes: Secondary | ICD-10-CM

## 2018-12-24 LAB — LIPID PANEL
Cholesterol: 230 mg/dL — ABNORMAL HIGH (ref 0–200)
HDL: 109 mg/dL (ref >39.00)
LDL Cholesterol: 107 mg/dL — ABNORMAL HIGH (ref 0–99)
NonHDL: 121.32
Total CHOL/HDL Ratio: 2
Triglycerides: 73 mg/dL (ref 0.0–149.0)
VLDL: 14.6 mg/dL (ref 0.0–40.0)

## 2018-12-24 LAB — HEMOGLOBIN A1C: Hgb A1c MFr Bld: 6.2 % (ref 4.6–6.5)

## 2018-12-24 NOTE — Progress Notes (Signed)
   Subjective:    Patient ID: Mary Mercado, female    DOB: Jan 09, 1941, 78 y.o.   MRN: [id number]  HPI Here to follow up on lipids and prediabetes. She had a successful cardioversion on 10-31-18, and she has been able to come off metoprolol. Her BP has been steady in the range of 120s to 140s over 70s and 80s. Her heart rate runs in the 60s and 70s. She feels well.    Review of Systems  Constitutional: Negative.   Respiratory: Negative.   Cardiovascular: Negative.   Neurological: Negative.        Objective:   Physical Exam Constitutional:      Appearance: Normal appearance.  Cardiovascular:     Rate and Rhythm: Normal rate and regular rhythm.     Pulses: Normal pulses.     Heart sounds: Normal heart sounds.  Pulmonary:     Effort: Pulmonary effort is normal.     Breath sounds: Normal breath sounds.  Neurological:     General: No focal deficit present.     Mental Status: She is alert and oriented to person, place, and time.           Assessment & Plan:  Dyslipidemia and prediabetes. She is fasting so we wil check a lipid panel and an A1c. [name], MD

## 2018-12-24 NOTE — Telephone Encounter (Signed)
Was wanting to update pharmacy but was already updated

## 2018-12-25 ENCOUNTER — Encounter: Payer: Self-pay | Admitting: Family Medicine

## 2018-12-25 DIAGNOSIS — C44729 Squamous cell carcinoma of skin of left lower limb, including hip: Secondary | ICD-10-CM | POA: Diagnosis not present

## 2018-12-25 DIAGNOSIS — C44619 Basal cell carcinoma of skin of left upper limb, including shoulder: Secondary | ICD-10-CM | POA: Diagnosis not present

## 2019-02-01 ENCOUNTER — Telehealth: Payer: Self-pay | Admitting: Cardiovascular Disease

## 2019-02-01 DIAGNOSIS — I10 Essential (primary) hypertension: Secondary | ICD-10-CM

## 2019-02-01 MED ORDER — TELMISARTAN 20 MG PO TABS: 20.0000 mg | ORAL_TABLET | Freq: Every day | ORAL | 11 refills | Status: DC

## 2019-02-01 NOTE — Telephone Encounter (Signed)
Spoke with patient who states her BP has been gradually increasing over the past month. States she has a shakiness/trembling with the high BP and her head will feel cloudy and not clear-headed BP today at 1145 158/79 mmHg HR ranging from 62-74 with most readings in the 60's bpm range. She denies high sodium diet or other changes in medical hx. States she is walking for exercise.  States she has not had any systolic readings 123456 mmHg since August. I reviewed with  Dr. Acie Fredrickson and he advised patient start Telmisartan 20 mg once daily and return for bmet in 3 weeks. I scheduled her for lab appointment on 10/30 and advised her to continue to monitor BP and call back with questions or concerns. I advised if she has sudden change in vision or headache, to seek emergency medical care. She verbalized understanding and agreement and thanked me for the call.

## 2019-02-01 NOTE — Telephone Encounter (Signed)
° ° °  Pt c/o BP issue: STAT if pt c/o blurred vision, one-sided weakness or slurred speech  1. What are your last 5 BP readings? 150/84, 166/87  2. Are you having any other symptoms (ex. Dizziness, headache, blurred vision, passed out)? Dizziness at times  3. What is your BP issue? Concerned BP too high

## 2019-02-11 ENCOUNTER — Other Ambulatory Visit: Payer: Self-pay

## 2019-02-11 ENCOUNTER — Ambulatory Visit (INDEPENDENT_AMBULATORY_CARE_PROVIDER_SITE_OTHER): Payer: Medicare Other

## 2019-02-11 DIAGNOSIS — Z23 Encounter for immunization: Secondary | ICD-10-CM | POA: Diagnosis not present

## 2019-02-22 ENCOUNTER — Other Ambulatory Visit: Payer: Self-pay

## 2019-02-22 ENCOUNTER — Other Ambulatory Visit: Payer: Medicare Other | Admitting: *Deleted

## 2019-02-22 DIAGNOSIS — I10 Essential (primary) hypertension: Secondary | ICD-10-CM | POA: Diagnosis not present

## 2019-02-22 LAB — BASIC METABOLIC PANEL
BUN/Creatinine Ratio: 16 (ref 12–28)
BUN: 11 mg/dL (ref 8–27)
CO2: 23 mmol/L (ref 20–29)
Calcium: 9.5 mg/dL (ref 8.7–10.3)
Chloride: 97 mmol/L (ref 96–106)
Creatinine, Ser: 0.68 mg/dL (ref 0.57–1.00)
GFR calc Af Amer: 96 mL/min/{1.73_m2} (ref >59)
GFR calc non Af Amer: 83 mL/min/{1.73_m2} (ref >59)
Glucose: 91 mg/dL (ref 65–99)
Potassium: 4.2 mmol/L (ref 3.5–5.2)
Sodium: 139 mmol/L (ref 134–144)

## 2019-02-25 ENCOUNTER — Other Ambulatory Visit: Payer: Self-pay

## 2019-02-25 ENCOUNTER — Ambulatory Visit: Payer: Medicare Other | Admitting: Internal Medicine

## 2019-02-25 ENCOUNTER — Ambulatory Visit (INDEPENDENT_AMBULATORY_CARE_PROVIDER_SITE_OTHER): Payer: Medicare Other | Admitting: Family Medicine

## 2019-02-25 ENCOUNTER — Ambulatory Visit (INDEPENDENT_AMBULATORY_CARE_PROVIDER_SITE_OTHER): Payer: Medicare Other

## 2019-02-25 ENCOUNTER — Ambulatory Visit: Payer: Self-pay | Admitting: *Deleted

## 2019-02-25 ENCOUNTER — Encounter: Payer: Self-pay | Admitting: Family Medicine

## 2019-02-25 VITALS — BP 132/80 | HR 85 | Temp 98.0°F | Ht 65.0 in | Wt 141.0 lb

## 2019-02-25 DIAGNOSIS — I63511 Cerebral infarction due to unspecified occlusion or stenosis of right middle cerebral artery: Secondary | ICD-10-CM

## 2019-02-25 DIAGNOSIS — S93602A Unspecified sprain of left foot, initial encounter: Secondary | ICD-10-CM

## 2019-02-25 DIAGNOSIS — S92355A Nondisplaced fracture of fifth metatarsal bone, left foot, initial encounter for closed fracture: Secondary | ICD-10-CM | POA: Diagnosis not present

## 2019-02-25 NOTE — Progress Notes (Deleted)
No chief complaint on file.   HPI: Mary Mercado 78 y.o. come in for SDA PCP APPT NA ROS: See pertinent positives and negatives per HPI.  Past Medical History:  Diagnosis Date  . Allergy   . CVA (cerebral vascular accident) ([place])   . Hyperlipidemia LDL goal <70   . Hypertension   . Palpitations   . Vertigo, benign positional     Family History  Problem Relation Age of Onset  . Colon cancer Father   . Diabetes Mother   . Heart attack Mother        62  . Heart attack Brother 32    Social History   Socioeconomic History  . Marital status: Married    Spouse name: Not on file  . Number of children: Not on file  . Years of education: Not on file  . Highest education level: Not on file  Occupational History  . Not on file  Social Needs  . Financial resource strain: Not on file  . Food insecurity    Worry: Not on file    Inability: Not on file  . Transportation needs    Medical: Not on file    Non-medical: Not on file  Tobacco Use  . Smoking status: Never Smoker  . Smokeless tobacco: Never Used  Substance and Sexual Activity  . Alcohol use: No  . Drug use: No  . Sexual activity: Not on file  Lifestyle  . Physical activity    Days per week: Not on file    Minutes per session: Not on file  . Stress: Not on file  Relationships  . Social Herbalist on phone: Not on file    Gets together: Not on file    Attends religious service: Not on file    Active member of club or organization: Not on file    Attends meetings of clubs or organizations: Not on file    Relationship status: Not on file  Other Topics Concern  . Not on file  Social History Narrative  . Not on file    Outpatient Medications Prior to Visit  Medication Sig Dispense Refill  . apixaban (ELIQUIS) 5 MG TABS tablet Take 5 mg by mouth 2 (two) times daily.    Marland Kitchen atorvastatin (LIPITOR) 40 MG tablet Take 1 tablet (40 mg total) by mouth daily at 6 PM. 90 tablet 3  . diltiazem  (CARDIZEM CD) 180 MG 24 hr capsule Take 1 capsule (180 mg total) by mouth daily. 30 capsule 11  . diltiazem (CARDIZEM) 30 MG tablet Take 1 tablet (30 mg total) by mouth 3 (three) times daily as needed (fast heart rate). 60 tablet 11  . telmisartan (MICARDIS) 20 MG tablet Take 1 tablet (20 mg total) by mouth daily. 30 tablet 11   No facility-administered medications prior to visit.      EXAM:  There were no vitals taken for this visit.  There is no height or weight on file to calculate BMI.  GENERAL: vitals reviewed and listed above, alert, oriented, appears well hydrated and in no acute distress HEENT: atraumatic, conjunctiva  clear, no obvious abnormalities on inspection of external nose and ears OP : no lesion edema or exudate  NECK: no obvious masses on inspection palpation  LUNGS: clear to auscultation bilaterally, no wheezes, rales or rhonchi, good air movement CV: HRRR, no clubbing cyanosis or  peripheral edema nl cap refill  MS: moves all extremities without noticeable focal  abnormality PSYCH: pleasant and cooperative, no obvious depression or anxiety Lab Results  Component Value Date   WBC 7.2 10/25/2018   HGB 15.2 10/25/2018   HCT 47.4 (H) 10/25/2018   PLT 294 10/25/2018   GLUCOSE 91 02/22/2019   CHOL 230 (H) 12/24/2018   TRIG 73.0 12/24/2018   HDL 109.00 12/24/2018   LDLCALC 107 (H) 12/24/2018   ALT 24 06/06/2018   AST 30 06/06/2018   NA 139 02/22/2019   K 4.2 02/22/2019   CL 97 02/22/2019   CREATININE 0.68 02/22/2019   BUN 11 02/22/2019   CO2 23 02/22/2019   TSH 1.71 05/05/2015   INR 0.95 05/31/2018   HGBA1C 6.2 12/24/2018   BP Readings from Last 3 Encounters:  12/24/18 (!) 150/80  11/30/18 138/84  11/19/18 136/79    ASSESSMENT AND PLAN:  Discussed the following assessment and plan:  No diagnosis found.  -Patient advised to return or notify health care team  if  new concerns arise.  There are no Patient Instructions on file for this visit.    Standley Brooking. Hawke Villalpando M.D.

## 2019-02-25 NOTE — Addendum Note (Signed)
Addended by: Suzette Battiest on: 02/25/2019 03:21 PM   Modules accepted: Orders

## 2019-02-25 NOTE — Telephone Encounter (Signed)
Pt scheduled  

## 2019-02-25 NOTE — Telephone Encounter (Signed)
  Husband is calling- wife injured foot last night. Putting weight on it is painful. Call to office- appointment scheduled. Reason for Disposition . [1] SEVERE pain AND [2] not improved 2 hours after pain medicine/ice packs  Answer Assessment - Initial Assessment Questions 1. MECHANISM: "How did the injury happen?" (e.g., twisting injury, direct blow)      foot slipped inside of bedroom slipper early this mornimg 2. ONSET: "When did the injury happen?" (Minutes or hours ago)      Early this mornig 3. LOCATION: "Where is the injury located?"      Left foot 4. APPEARANCE of INJURY: "What does the injury look like?"      No swelling apparent 5. WEIGHT-BEARING: "Can you put weight on that foot?" "Can you walk (four steps or more)?"       Walking with cane-painful 6. SIZE: For cuts, bruises, or swelling, ask: "How large is it?" (e.g., inches or centimeters;  entire joint)      no 7. PAIN: "Is there pain?" If so, ask: "How bad is the pain?"    (e.g., Scale 1-10; or mild, moderate, severe)     Yes- 7 8. TETANUS: For any breaks in the skin, ask: "When was the last tetanus booster?"     n/a 9. OTHER SYMPTOMS: "Do you have any other symptoms?"      no 10. PREGNANCY: "Is there any chance you are pregnant?" "When was your last menstrual period?"       n/a  Protocols used: FOOT AND ANKLE INJURY-A-AH

## 2019-02-25 NOTE — Progress Notes (Signed)
   Subjective:    Patient ID: Mary Mercado, female    DOB: November 01, 1940, 78 y.o.   MRN: [id number]  HPI Here with her husband for an injury to the left foot that occurred at 2:30 this morning at home. She got out of bed to go to the bathroom, and she has been slipping on a pair of backless sneakers to get around the house. This morning her left foot shifted to the side and went out of the shoe, causing the foot to twist. Fortunately she did not fall. She has had a sharp pain in the lateral foot since then. She has kept it elevated and iced, and this has helped.    Review of Systems  Constitutional: Negative.   Respiratory: Negative.   Cardiovascular: Negative.   Musculoskeletal: Positive for arthralgias.       Objective:   Physical Exam Constitutional:      Appearance: Normal appearance.     Comments: Walks with a cane, favoring the left foot   Cardiovascular:     Rate and Rhythm: Normal rate. Rhythm irregular.     Pulses: Normal pulses.     Heart sounds: Normal heart sounds.  Pulmonary:     Effort: Pulmonary effort is normal.     Breath sounds: Normal breath sounds.  Musculoskeletal:     Comments: The left foot has mild swelling along the lateral edge. No ecchymosis. She is tender along the 5th metatarsal, no crepitus. The ankle is intact   Neurological:     Mental Status: She is alert.           Assessment & Plan:  Foot sprain, possibly a fractured metatarsal. We will get Xrays of the foot today. She will stay off the foot and keep it elevated. Use ice packs and Tylenol as needed. I suggested she use her walker to get around the house for a few weeks.  [name], MD

## 2019-02-28 ENCOUNTER — Ambulatory Visit: Payer: Medicare Other

## 2019-03-03 NOTE — Progress Notes (Signed)
Cardiology Office Note:    Date:  03/04/2019   ID:  [facility], [place] 06-01-1940, MRN [id number]  PCP:  [name], MD  Cardiologist:  Mertie Moores, MD  Electrophysiologist:  None   Referring MD: [name], MD    Problem List 1. Atrial Fib:    CHADS2VASC =  5   ( female, age 78, CVA Chief Complaint  Patient presents with  . Atrial Fibrillation    Aug. 7, 2020     Charlina Rogalski Cappa is a 78 y.o. female with a hx of rapid atrial fib.    We cardioverted her on July 7. Is doing great.   Is having some dizziness.  Constant.  Not related to orthostatic.  Is not dizzy when she is lying down      Thinks its due to the metoprolol   Nov. 9, 2020  [place] is seen today for follow up of her atrial fib Doing well No cp Breathing is ok Has some left lower rib soreness.  Has lots of dizziness.   Especially after starting the telmisartan .    Reviewed her BP log  No readings below 100 .   Most blood pressure readings since starting the telmisartan are in the normal range.  Her blood pressure was mildly elevated before starting telmisartan.     Past Medical History:  Diagnosis Date  . Allergy   . CVA (cerebral vascular accident) ([place])   . Hyperlipidemia LDL goal <70   . Hypertension   . Palpitations   . Vertigo, benign positional     Past Surgical History:  Procedure Laterality Date  . CARDIOVERSION Left 10/31/2018   Procedure: CARDIOVERSION;  Surgeon: Acie Fredrickson Wonda Cheng, MD;  Location: [facility] ENDOSCOPY;  Service: Cardiovascular;  Laterality: Left;  . Hemilaminectomy and microdiskectomy at L4-5 on the left.  12/10/2003  . TEE WITHOUT CARDIOVERSION N/A 06/05/2018   Procedure: TRANSESOPHAGEAL ECHOCARDIOGRAM (TEE);  Surgeon: Lelon Perla, MD;  Location: [facility] ENDOSCOPY;  Service: Cardiovascular;  Laterality: N/A;  loop    Current Medications: Current Meds  Medication Sig  . apixaban (ELIQUIS) 5 MG TABS tablet Take 5 mg by mouth 2 (two) times daily.   Marland Kitchen atorvastatin (LIPITOR) 40 MG tablet Take 1 tablet (40 mg total) by mouth daily at 6 PM.  . diltiazem (CARDIZEM CD) 180 MG 24 hr capsule Take 1 capsule (180 mg total) by mouth daily.  Marland Kitchen telmisartan (MICARDIS) 20 MG tablet Take 1 tablet (20 mg total) by mouth daily.     Allergies:   Acetaminophen   Social History   Socioeconomic History  . Marital status: Married    Spouse name: Not on file  . Number of children: Not on file  . Years of education: Not on file  . Highest education level: Not on file  Occupational History  . Not on file  Social Needs  . Financial resource strain: Not on file  . Food insecurity    Worry: Not on file    Inability: Not on file  . Transportation needs    Medical: Not on file    Non-medical: Not on file  Tobacco Use  . Smoking status: Never Smoker  . Smokeless tobacco: Never Used  Substance and Sexual Activity  . Alcohol use: No  . Drug use: No  . Sexual activity: Not on file  Lifestyle  . Physical activity    Days per week: Not on file    Minutes per session: Not on file  .  Stress: Not on file  Relationships  . Social Herbalist on phone: Not on file    Gets together: Not on file    Attends religious service: Not on file    Active member of club or organization: Not on file    Attends meetings of clubs or organizations: Not on file    Relationship status: Not on file  Other Topics Concern  . Not on file  Social History Narrative  . Not on file     Family History: The patient's family history includes Colon cancer in her father; Diabetes in her mother; Heart attack in her mother; Heart attack (age of onset: 76) in her brother.  ROS:   Please see the history of present illness.     All other systems reviewed and are negative.  EKGs/Labs/Other Studies Reviewed:    The following studies were reviewed today:   EKG:    Recent Labs: 06/06/2018: ALT 24 10/25/2018: Hemoglobin 15.2; Platelets 294 02/22/2019: BUN 11;  Creatinine, Ser 0.68; Potassium 4.2; Sodium 139  Recent Lipid Panel    Component Value Date/Time   CHOL 230 (H) 12/24/2018 1239   TRIG 73.0 12/24/2018 1239   HDL 109.00 12/24/2018 1239   CHOLHDL 2 12/24/2018 1239   VLDL 14.6 12/24/2018 1239   LDLCALC 107 (H) 12/24/2018 1239    Physical Exam: Blood pressure 117/62, pulse 60, height 5\' 5"  (1.651 m), weight 141 lb 12.8 oz (64.3 kg), SpO2 95 %.  GEN:  Well nourished, well developed in no acute distress HEENT: Normal NECK: No JVD; No carotid bruits LYMPHATICS: No lymphadenopathy CARDIAC: RRR , no murmurs, rubs, gallops RESPIRATORY:  Clear to auscultation without rales, wheezing or rhonchi  ABDOMEN: Soft, non-tender, non-distended MUSCULOSKELETAL:  No edema; No deformity  SKIN: Warm and dry NEUROLOGIC:  Alert and oriented x 3 nex    ASSESSMENT:    No diagnosis found. PLAN:      1.  Atrial fibrillation: Remains in normal sinus rhythm.  Continue current medications.  2.  Dizziness: Has had lots of dizziness which I think is related to her previous stroke.  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  She was curious as to whether or not the telmisartan was contributing to her dizziness but in reviewing her blood pressure log I do not see any blood pressure readings that would me make me suspicious that she is having hypotension.  It is possible that she has underlying cerebral vascular disease and actually needs a higher blood pressure than what we are achieving with her current medicines.  I will ask Dr. Leonie Man to review the Brain MRI angio from Feb. And coment   I will see her in 3 months for follow-up visit.   Medication Adjustments/Labs and Tests Ordered: Current medicines are reviewed at length with the patient today.  Concerns regarding medicines are outlined above.  No orders of the defined types were placed in this encounter.  No orders of the defined types were placed  in this encounter.   Patient Instructions  Medication Instructions:  Your physician recommends that you continue on your current medications as directed. Please refer to the Current Medication list given to you today.  *If you need a refill on your cardiac medications before your next appointment, please call your pharmacy*   Lab Work: None Ordered    Testing/Procedures: None Ordered   Follow-Up: At [facility], you and your health  needs are our priority.  As part of our continuing mission to provide you with exceptional heart care, we have created designated Provider Care Teams.  These Care Teams include your primary Cardiologist (physician) and Advanced Practice Providers (APPs -  Physician Assistants and Nurse Practitioners) who all work together to provide you with the care you need, when you need it.  Your next appointment:   6 months  The format for your next appointment:   Either In Person or Virtual  Provider:   You may see Mertie Moores, MD or one of the following Advanced Practice Providers on your designated Care Team:    Richardson Dopp, PA-C  Vin Mentor, [place]  Daune Perch, [place]       Signed, Mertie Moores, MD  03/04/2019 12:28 PM    [place]

## 2019-03-04 ENCOUNTER — Other Ambulatory Visit: Payer: Self-pay

## 2019-03-04 ENCOUNTER — Ambulatory Visit (INDEPENDENT_AMBULATORY_CARE_PROVIDER_SITE_OTHER): Payer: Medicare Other | Admitting: Cardiovascular Disease

## 2019-03-04 ENCOUNTER — Encounter: Payer: Self-pay | Admitting: Cardiovascular Disease

## 2019-03-04 VITALS — BP 117/62 | HR 60 | Ht 65.0 in | Wt 141.8 lb

## 2019-03-04 DIAGNOSIS — I63511 Cerebral infarction due to unspecified occlusion or stenosis of right middle cerebral artery: Secondary | ICD-10-CM | POA: Diagnosis not present

## 2019-03-04 DIAGNOSIS — I63411 Cerebral infarction due to embolism of right middle cerebral artery: Secondary | ICD-10-CM

## 2019-03-04 DIAGNOSIS — I4819 Other persistent atrial fibrillation: Secondary | ICD-10-CM | POA: Diagnosis not present

## 2019-03-04 NOTE — Patient Instructions (Signed)
Medication Instructions:  Your physician recommends that you continue on your current medications as directed. Please refer to the Current Medication list given to you today.  *If you need a refill on your cardiac medications before your next appointment, please call your pharmacy*   Lab Work: None Ordered   Testing/Procedures: None Ordered   Follow-Up: At [facility], you and your health needs are our priority.  As part of our continuing mission to provide you with exceptional heart care, we have created designated Provider Care Teams.  These Care Teams include your primary Cardiologist (physician) and Advanced Practice Providers (APPs -  Physician Assistants and Nurse Practitioners) who all work together to provide you with the care you need, when you need it.  Your next appointment:   6 month(s)  The format for your next appointment:   Either In Person or Virtual  Provider:   You may see Philip Nahser, MD or one of the following Advanced Practice Providers on your designated Care Team:    Scott Weaver, PA-C  Vin Bhagat, PA-C  Janine Hammond, NP    

## 2019-03-04 NOTE — Progress Notes (Signed)
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  Not entirely convinced that her dizziness is related to her extracranial vertebral stenosis.  The stroke symptoms she had previously were not in this vascular distribution.

## 2019-03-05 ENCOUNTER — Telehealth: Payer: Self-pay | Admitting: Nurse Practitioner

## 2019-03-05 DIAGNOSIS — H5231 Anisometropia: Secondary | ICD-10-CM | POA: Diagnosis not present

## 2019-03-05 DIAGNOSIS — Z9842 Cataract extraction status, left eye: Secondary | ICD-10-CM | POA: Diagnosis not present

## 2019-03-05 DIAGNOSIS — Z961 Presence of intraocular lens: Secondary | ICD-10-CM | POA: Diagnosis not present

## 2019-03-05 DIAGNOSIS — H40003 Preglaucoma, unspecified, bilateral: Secondary | ICD-10-CM | POA: Diagnosis not present

## 2019-03-05 DIAGNOSIS — H40031 Anatomical narrow angle, right eye: Secondary | ICD-10-CM | POA: Diagnosis not present

## 2019-03-05 DIAGNOSIS — H524 Presbyopia: Secondary | ICD-10-CM | POA: Diagnosis not present

## 2019-03-05 DIAGNOSIS — H2511 Age-related nuclear cataract, right eye: Secondary | ICD-10-CM | POA: Diagnosis not present

## 2019-03-05 DIAGNOSIS — H35363 Drusen (degenerative) of macula, bilateral: Secondary | ICD-10-CM | POA: Diagnosis not present

## 2019-03-05 DIAGNOSIS — H5210 Myopia, unspecified eye: Secondary | ICD-10-CM | POA: Diagnosis not present

## 2019-03-05 MED ORDER — TELMISARTAN 20 MG PO TABS: 10.0000 mg | ORAL_TABLET | Freq: Every day | ORAL | 3 refills | Status: DC

## 2019-03-05 NOTE — Telephone Encounter (Signed)
Reviewed Dr. Elmarie Shiley advice with patient who verbalized understanding and agreement. I advised her to monitor BP and HR and to call back if BP is consistently > 145/80 mmHg or if she has trouble splitting the telmisartan pills in half. She verbalized understanding and agreement and thanked me for the call.

## 2019-03-05 NOTE — Telephone Encounter (Signed)
-----   Message from Thayer Headings, MD sent at 03/04/2019  5:25 PM EST ----- Thank you Pramod, We will have her cut her telmasartan in 1/2 and see how she does  I appreciate your assistance  Sharyn Lull,  will you see if she can break the Telmisartan in half and have her take 1/2 a day ( 10 mg)    If they dont break well, we can change to Losartan 25 mg a day .   Phil  ----- Message ----- From: Garvin Fila, MD Sent: 03/04/2019   4:55 PM EST To: Thayer Headings, MD    ----- Message ----- From: Thayer Headings, MD Sent: 03/04/2019   9:56 AM EST To: Garvin Fila, MD  Pramod,  Will you review the MRI angio of the brain and see if there is an explanation for Demri's dizziness. Her BP looks great - in on telmisar I would be happy to reduce the dose of telmisartan if you think that she actually needs a slightly higher blood pressure based on cerebral ischemia.    Thanks  [employer]

## 2019-04-08 ENCOUNTER — Telehealth: Payer: Self-pay | Admitting: Cardiovascular Disease

## 2019-04-08 NOTE — Telephone Encounter (Signed)
Only todays BP is elevated.  If her BP remains elevated for several days or a week or so, then we should consider increasing the micrdis back up. Day to day fluctuations are normal and are likely related to her diet . Continue to monitor .

## 2019-04-08 NOTE — Telephone Encounter (Signed)
Pt c/o BP issue: STAT if pt c/o blurred vision, one-sided weakness or slurred speech  1. What are your last 5 BP readings? Today it is 170/97 and pulse Friday it was 129/74 and pulse 64 - Thursday it was 148/76 and pulse was 81  2. Are you having any other symptoms (ex. Dizziness, headache, blurred vision, passed out)? Always dizzy  3. What is your BP issue?  Blood Pressure is high

## 2019-04-08 NOTE — Telephone Encounter (Signed)
Spoke with patient and advised her to continue to monitor BP for the next 3-5 days because we do not want to change medications based on a few elevated readings. She reports BP 172/89, pulse 76 bpm @ 4:00 pm today I advised her that if systolic BP > or = [id number] mmHg that she should increase telmisartan to 20 mg daily. I advised her to call back at the end of the week to let us know so that we can accurately record medications. She thanked me for the call.

## 2019-04-16 NOTE — Telephone Encounter (Signed)
Called patient to find out how her BP readings have been. She states she continues to have occasional high readings but has not taken additional telmisartan. Reports she has been checking her BP when she wakes up. I advised her to monitor the BP readings 1-2 hours after she takes her medication and if BP is consistently [id number] systolic, to take telmisartan 20 mg. She asks about having fast and/or pounding heart rate and if she can take diltiazem 30 mg as needed for times when she feels the pounding. I advised her to make certain her heart rate is >55 bpm at those times before taking the diltiazem 30 mg. She states she has been feeling well and is thankful to have these guidelines for times when she may need them. She thanked me for calling.

## 2019-04-23 ENCOUNTER — Other Ambulatory Visit: Payer: Self-pay | Admitting: Cardiovascular Disease

## 2019-04-23 NOTE — Telephone Encounter (Signed)
Eliquis 5mg  refill request received. Pt is 78 yrs old, weight-64.3kg, Crea-0.68 on 02/22/2019, Diagnosis-Afib, and last seen by Dr. Acie Fredrickson on 03/04/2019. Dose is appropriate based on dosing criteria. Will send in refill to requested pharmacy.

## 2019-04-24 ENCOUNTER — Telehealth: Payer: Self-pay | Admitting: Cardiovascular Disease

## 2019-04-24 NOTE — Telephone Encounter (Signed)
New Message  Pt c/o of Chest Pain: STAT if CP now or developed within 24 hours  1. Are you having CP right now? No  2. Are you experiencing any other symptoms (ex. SOB, nausea, vomiting, sweating)? No; blood pressure was 159/71  3. How long have you been experiencing CP? 2 hours  4. Is your CP continuous or coming and going? Coming and going  5. Have you taken Nitroglycerin? No ?

## 2019-04-24 NOTE — Telephone Encounter (Signed)
Returned call to Pt.  Per Pt she had some pain to the right of her sternum.  Per Pt she had taken something for indigestion and the pain seems to be resolving.  Advised it sounded like indigestion.  Discussed chest pain and additional symptoms that might be related to a heart attack.    Pt indicates understanding.  Advised to call if she developed chest pain not relieved with antacids, or accompanied by sob, nausea, sweating, etc.  Pt indicates understanding.  Also encouraged Pt to go to [facility] clinic website to review chest pain and associated symptoms with heart attack.  Pt thanked nurse for return call.

## 2019-04-30 ENCOUNTER — Telehealth: Payer: Self-pay

## 2019-05-02 ENCOUNTER — Ambulatory Visit (INDEPENDENT_AMBULATORY_CARE_PROVIDER_SITE_OTHER): Payer: Medicare Other | Admitting: Adult Health

## 2019-05-02 ENCOUNTER — Encounter: Payer: Self-pay | Admitting: Adult Health

## 2019-05-02 ENCOUNTER — Other Ambulatory Visit: Payer: Self-pay

## 2019-05-02 VITALS — BP 140/81 | HR 78 | Temp 97.1°F | Ht 65.0 in | Wt 141.4 lb

## 2019-05-02 DIAGNOSIS — I63411 Cerebral infarction due to embolism of right middle cerebral artery: Secondary | ICD-10-CM

## 2019-05-02 DIAGNOSIS — I6502 Occlusion and stenosis of left vertebral artery: Secondary | ICD-10-CM | POA: Diagnosis not present

## 2019-05-02 DIAGNOSIS — R202 Paresthesia of skin: Secondary | ICD-10-CM | POA: Diagnosis not present

## 2019-05-02 DIAGNOSIS — R29898 Other symptoms and signs involving the musculoskeletal system: Secondary | ICD-10-CM

## 2019-05-02 DIAGNOSIS — I480 Paroxysmal atrial fibrillation: Secondary | ICD-10-CM

## 2019-05-02 MED ORDER — DULOXETINE HCL 30 MG PO CPEP: 30.0000 mg | ORAL_CAPSULE | Freq: Every day | ORAL | 3 refills | Status: DC

## 2019-05-02 NOTE — Telephone Encounter (Signed)
ERROR

## 2019-05-02 NOTE — Patient Instructions (Addendum)
Recommend repeating carotid ultrasound for surveillance monitoring of left vertebral artery narrowing  Recommend initiating Cymbalta 30 mg daily to help with underlying anxiety along with possible benefit with nerve pain (burning type sensation on the left side from your stroke)  Order placed to start physical therapy -you will be called to schedule initial evaluation  Continue Eliquis (apixaban) daily for secondary stroke prevention  Continue to follow up with PCP regarding cholesterol and blood pressure management   Continue to monitor blood pressure at home  Maintain strict control of hypertension with blood pressure goal below 130/90, diabetes with hemoglobin A1c goal below 6.5% and cholesterol with LDL cholesterol (bad cholesterol) goal below 70 mg/dL. I also advised the patient to eat a healthy diet with plenty of whole grains, cereals, fruits and vegetables, exercise regularly and maintain ideal body weight.  Followup in the future with me in 3 months or call earlier if needed       Thank you for coming to see [place] at [facility]. I hope we have been able to provide you high quality care today.  You may receive a patient satisfaction survey over the next few weeks. We would appreciate your feedback and comments so that we may continue to improve ourselves and the health of our patients.    Generalized Anxiety Disorder, Adult Generalized anxiety disorder (GAD) is a mental health disorder. People with this condition constantly worry about everyday events. Unlike normal anxiety, worry related to GAD is not triggered by a specific event. These worries also do not fade or get better with time. GAD interferes with life functions, including relationships, work, and school. GAD can vary from mild to severe. People with severe GAD can have intense waves of anxiety with physical symptoms (panic attacks). What are the causes? The exact cause of GAD is not known. What  increases the risk? This condition is more likely to develop in:  Women.  People who have a family history of anxiety disorders.  People who are very shy.  People who experience very stressful life events, such as the death of a loved one.  People who have a very stressful family environment. What are the signs or symptoms? People with GAD often worry excessively about many things in their lives, such as their health and family. They may also be overly concerned about:  Doing well at work.  Being on time.  Natural disasters.  Friendships. Physical symptoms of GAD include:  Fatigue.  Muscle tension or having muscle twitches.  Trembling or feeling shaky.  Being easily startled.  Feeling like your heart is pounding or racing.  Feeling out of breath or like you cannot take a deep breath.  Having trouble falling asleep or staying asleep.  Sweating.  Nausea, diarrhea, or irritable bowel syndrome (IBS).  Headaches.  Trouble concentrating or remembering facts.  Restlessness.  Irritability. How is this diagnosed? Your health care provider can diagnose GAD based on your symptoms and medical history. You will also have a physical exam. The health care provider will ask specific questions about your symptoms, including how severe they are, when they started, and if they come and go. Your health care provider may ask you about your use of alcohol or drugs, including prescription medicines. Your health care provider may refer you to a mental health specialist for further evaluation. Your health care provider will do a thorough examination and may perform additional tests to rule out other possible causes of your symptoms. To be diagnosed  with GAD, a person must have anxiety that:  Is out of his or her control.  Affects several different aspects of his or her life, such as work and relationships.  Causes distress that makes him or her unable to take part in normal  activities.  Includes at least three physical symptoms of GAD, such as restlessness, fatigue, trouble concentrating, irritability, muscle tension, or sleep problems. Before your health care provider can confirm a diagnosis of GAD, these symptoms must be present more days than they are not, and they must last for six months or longer. How is this treated? The following therapies are usually used to treat GAD:  Medicine. Antidepressant medicine is usually prescribed for long-term daily control. Antianxiety medicines may be added in severe cases, especially when panic attacks occur.  Talk therapy (psychotherapy). Certain types of talk therapy can be helpful in treating GAD by providing support, education, and guidance. Options include: ? Cognitive behavioral therapy (CBT). People learn coping skills and techniques to ease their anxiety. They learn to identify unrealistic or negative thoughts and behaviors and to replace them with positive ones. ? Acceptance and commitment therapy (ACT). This treatment teaches people how to be mindful as a way to cope with unwanted thoughts and feelings. ? Biofeedback. This process trains you to manage your body's response (physiological response) through breathing techniques and relaxation methods. You will work with a therapist while machines are used to monitor your physical symptoms.  Stress management techniques. These include yoga, meditation, and exercise. A mental health specialist can help determine which treatment is best for you. Some people see improvement with one type of therapy. However, other people require a combination of therapies. Follow these instructions at home:  Take over-the-counter and prescription medicines only as told by your health care provider.  Try to maintain a normal routine.  Try to anticipate stressful situations and allow extra time to manage them.  Practice any stress management or self-calming techniques as taught by your  health care provider.  Do not punish yourself for setbacks or for not making progress.  Try to recognize your accomplishments, even if they are small.  Keep all follow-up visits as told by your health care provider. This is important. Contact a health care provider if:  Your symptoms do not get better.  Your symptoms get worse.  You have signs of depression, such as: ? A persistently sad, cranky, or irritable mood. ? Loss of enjoyment in activities that used to bring you joy. ? Change in weight or eating. ? Changes in sleeping habits. ? Avoiding friends or family members. ? Loss of energy for normal tasks. ? Feelings of guilt or worthlessness. Get help right away if:  You have serious thoughts about hurting yourself or others. If you ever feel like you may hurt yourself or others, or have thoughts about taking your own life, get help right away. You can go to your nearest emergency department or call:  Your local emergency services (911 in the [place]  A suicide crisis helpline, such as the [facility] at 878-585-1328. This is open 24 hours a day. Summary  Generalized anxiety disorder (GAD) is a mental health disorder that involves worry that is not triggered by a specific event.  People with GAD often worry excessively about many things in their lives, such as their health and family.  GAD may cause physical symptoms such as restlessness, trouble concentrating, sleep problems, frequent sweating, nausea, diarrhea, headaches, and trembling or muscle twitching.  A mental health specialist can help determine which treatment is best for you. Some people see improvement with one type of therapy. However, other people require a combination of therapies. This information is not intended to replace advice given to you by your health care provider. Make sure you discuss any questions you have with your health care provider. Document Revised: 03/24/2017 Document  Reviewed: 03/01/2016 Elsevier Patient Education  2020 [employer].

## 2019-05-02 NOTE — Progress Notes (Signed)
[facility] [address] [place]. [place] [address] [phone]       OFFICE FOLLOW-UP NOTE  Mary Mercado Date of Birth:  [birth year] Medical Record Number:  [id number]   Reason for visit: Stroke follow-up  Chief complaint: Chief Complaint  Patient presents with  . Follow-up    6 mon f/u. Husband present. Rm 9. Patient mentioned that she has some spells where her insides quiver really bad to the point where she feels nausated and she has some weakness/numbness to her left side. She stated these spells could from to 2 hours to all night. She would like to know could this because of her previous stroke.      HPI:  Initial video visit 08/13/2018 Dr. Leonie Mercado : This is a initial video virtual consultation visit on Mary Mercado who was admitted to [facility] in February 2020 with a stroke.  I have personally obtained history of presenting illness from the patient and her husband and reviewed electronic medical records as well as imaging films in PACS.  She was admitted on 05/31/2018 with sudden onset of left arm and leg weakness and numbness as well as feeling dizzy and had one episode of emesis.  She had CT scan and CT angiogram in the ER which showed no large vessel occlusion she was given IV TPA and admitted to the neurological intensive care unit.  NIH stroke scale on admission was 4.  At baseline modified Rankin score was 0.  Patient had tight blood pressure control.  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  Transthoracic echo showed normal ejection fraction.  Transesophageal echocardiogram showed no cardiac source of embolism or PFO.  LDL cholesterol was elevated at 1 1 7  mg percent.  Hemoglobin A1c was 5.9.  Urine drug screen was negative.  Lower extremity venous Dopplers were negative for DVT.  CT angiogram of the brain showed mild intracranial atherosclerosis involving right P2 and  bilateral cavernous carotid siphons and severe left vertebral artery stenosis but there was no significant disease of the right middle cerebral or carotid artery.  Patient was started on aspirin 81 and Plavix 75 mg daily for 3 weeks and underwent an outpatient 30-day heart monitor which subsequently showed paroxysmal atrial fibrillation.  She has since then stopped aspirin and Plavix and has been switched to Eliquis.  Patient states she has done well since discharge.  She was initially transferred to inpatient rehab where she stayed for a few weeks and did well.  She is still has left-sided numbness but feels some of the sensation may be coming back now.  She can walk independently without assistance though her husband stays close by.  She has had no falls or injuries.  She is tolerating Eliquis well without bleeding or bruising.  She is also tolerating Lipitor well.  She complains of little bit of jitteriness and tiredness but she blames this likely on her new A. fib medication.  She has no new complaints.  She has no prior history of strokes TIAs.  Update 11/19/2018 Dr. Leonie Mercado: She is seen today for office follow-up visit following initial video follow-up visit on 08/13/2018.  She is accompanied by her husband.  She states she continues to have left leg paresthesias and numbness.  She has some mild gait and balance difficulties.  At times she noticed that the legs are trembling and she is initiating walk and she has to hold on something  and the feeling goes away.  She has also been started on several new cardiac medications per her ablation for A. fib and feels medication may be the cause for her dizziness.  She is tolerating Eliquis well without bruising or bleeding.  Blood pressures well controlled and today it is 136/79.  She remains on Lipitor which is tolerating well without muscle aches and pains.  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  Update 05/02/2019: Mary Mercado is a 79 year old female who is being seen today for stroke follow-up accompanied by her husband.  Residual deficits left-sided paresthesias consisting of numbness/tingling and occasional burning sensation.  She also endorses episodes of sensation of full body "intermittently shaking" and is usually worsened by stress, anxiety or fatigue.  Usually accompanied by nausea but denies headache or dizziness.  She becomes fearful during those times that she may be having a heart attack.  When questioned regarding anxiety, she does endorse minimal anxiety but after doing GAD-7 test, score of 20/21 showing severe anxiety.  She denies any prior history or family history of depression or anxiety.  She continues on Eliquis without bleeding or bruising.  She continues on atorvastatin without myalgias.  Blood pressure today 140/81.  She denies any additional episodes of dizziness and continues to follow with cardiology for blood pressure management.  She has recently decreased telmisartan as it is recommended to continue blood pressure between 130-150 due to vertebral artery stenosis.  No further concerns at this time.     ROS:   14 system review of systems is positive for numbness/tingling, pain, anxiety and tremors and all other systems negative  PMH:  Past Medical History:  Diagnosis Date  . Allergy   . CVA (cerebral vascular accident) ([place])   . Hyperlipidemia LDL goal <70   . Hypertension   . Palpitations   . Vertigo, benign positional     Social History:  Social History   Socioeconomic History  . Marital status: Married    Spouse name: Not on file  . Number of children: Not on file  . Years of education: Not on file  . Highest education level: Not on file  Occupational History  . Not on file  Tobacco Use  . Smoking status: Never Smoker  . Smokeless tobacco: Never Used  Substance and Sexual Activity  . Alcohol use: No  . Drug use: No  .  Sexual activity: Not on file  Other Topics Concern  . Not on file  Social History Narrative  . Not on file   Social Determinants of Health   Financial Resource Strain:   . Difficulty of Paying Living Expenses: Not on file  Food Insecurity:   . Worried About Charity fundraiser in the Last Year: Not on file  . Ran Out of Food in the Last Year: Not on file  Transportation Needs:   . Lack of Transportation (Medical): Not on file  . Lack of Transportation (Non-Medical): Not on file  Physical Activity:   . Days of Exercise per Week: Not on file  . Minutes of Exercise per Session: Not on file  Stress:   . Feeling of Stress : Not on file  Social Connections:   . Frequency of Communication with Friends and Family: Not on file  . Frequency of Social Gatherings with Friends and Family: Not on file  . Attends Religious Services: Not on file  .  Active Member of Clubs or Organizations: Not on file  . Attends Archivist Meetings: Not on file  . Marital Status: Not on file  Intimate Partner Violence:   . Fear of Current or Ex-Partner: Not on file  . Emotionally Abused: Not on file  . Physically Abused: Not on file  . Sexually Abused: Not on file    Medications:   Current Outpatient Medications on File Prior to Visit  Medication Sig Dispense Refill  . atorvastatin (LIPITOR) 40 MG tablet Take 1 tablet (40 mg total) by mouth daily at 6 PM. 90 tablet 3  . diltiazem (CARDIZEM CD) 180 MG 24 hr capsule Take 1 capsule (180 mg total) by mouth daily. 30 capsule 11  . ELIQUIS 5 MG TABS tablet TAKE 1 TABLET BY MOUTH TWICE A DAY 60 tablet 10  . telmisartan (MICARDIS) 20 MG tablet Take 0.5 tablets (10 mg total) by mouth daily. 45 tablet 3   No current facility-administered medications on file prior to visit.    Allergies:   Allergies  Allergen Reactions  . Acetaminophen Hives and Rash    Physical Exam  Today's Vitals   05/02/19 0818  BP: 140/81  Pulse: 78  Temp: (!) 97.1 F  (36.2 C)  TempSrc: Oral  Weight: 141 lb 6.4 oz (64.1 kg)  Height: 5\' 5"  (1.651 m)   Body mass index is 23.53 kg/m.   GAD 7 : Generalized Anxiety Score 05/02/2019  Nervous, Anxious, on Edge 3  Control/stop worrying 3  Worry too much - different things 3  Trouble relaxing 3  Restless 2  Easily annoyed or irritable 3  Afraid - awful might happen 3  Total GAD 7 Score 20    General: frail elderly Caucasian lady seated, in no evident distress Head: head normocephalic and atraumatic.  Neck: supple with no carotid or supraclavicular bruits Cardiovascular: irregular rate and rhythm, no murmurs Musculoskeletal: no deformity Skin:  no rash/petichiae Vascular:  Normal pulses all extremities   Neurologic Exam Mental Status: Awake and fully alert.  Normal speech and language.  Oriented to place and time. Recent and remote memory intact. Attention span, concentration and fund of knowledge appropriate. Mood and affect increased anxiety and will become tearful frequently throughout visit Cranial Nerves: Pupils equal, briskly reactive to light. Extraocular movements full without nystagmus. Visual fields full to confrontation. Hearing intact. Facial sensation intact. Face, tongue, palate moves normally and symmetrically.  Motor: Normal bulk and tone. Normal strength in all tested extremity muscles except mild bilateral hip flexor weakness. Sensory.:  Hypersensitivity left anterior thigh but equal throughout all other tested extremities Coordination: Rapid alternating movements normal in all extremities. Finger-to-nose and heel-to-shin performed accurately bilaterally.  Mild action tremors bilateral upper extremities.  No tremors at rest. Gait and Station: Arises from chair without difficulty. Stance is normal. Gait demonstrates normal stride length and balance . Able to heel, toe and tandem walk without difficulty.  Reflexes: 1+ and symmetric. Toes downgoing.       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  Vascular risk factors of hypertension, hyperlipidemia, A. fib and age.  Residual deficits of left-sided paresthesias.  Greatest concern at today's visit is frequent episodes of full body "internal shaking" worsened with increased stress or fatigue.  Also noted to have severe underlying anxiety likely also contributing    PLAN: -Recommend initiating Cymbalta 30 mg nightly to help assist with both anxiety and possible benefit  of paresthesias.  Discussion regarding risk factors as well as monitoring of blood pressure.  May need to increase after 4 weeks.  She may also likely benefit from psychologist in the future -Repeat carotid ultrasound for surveillance monitoring of left vertebral artery stenosis -Referral placed to PT for ongoing paresthesias and bilateral hip flexor weakness likely secondary to deconditioning -Continue Eliquis (apixaban) daily and atorvastatin for secondary stroke prevention -Continue to follow with cardiology for atrial fibrillation and Eliquis management -maintain strict control of hypertension with blood pressure goal below 130/90, diabetes with hemoglobin A1c goal below 6.5% and lipids with LDL cholesterol goal below 70 mg/dL. I also advised the patient to eat a healthy diet with plenty of whole grains, cereals, fruits and vegetables, exercise regularly and maintain ideal body weight   Follow-up in 3 months or call earlier if needed  Greater than 50% of time during this 25 minute visit was spent on counseling, discussion regarding episodes that likely have a large component of underlying severe anxiety, discussion regarding prior stroke and indication for repeat imaging, planning of further management, discussion with patient and family and coordination of care   Frann Rider, [facility]  [facility] [address] [place], [place]  [address]  Phone [phone] Fax [phone] Note: This document was prepared with digital dictation and possible smart phrase technology. Any transcriptional errors that result from this process are unintentional.

## 2019-05-06 NOTE — Progress Notes (Signed)
I agree with the above plan 

## 2019-05-07 ENCOUNTER — Telehealth: Payer: Self-pay | Admitting: Adult Health

## 2019-05-07 NOTE — Telephone Encounter (Signed)
I called pt and she stated that she took duloxetine caused her Bp to rise, 162-170 sys 82-90 dias.  She has stopped and her bp today 148/81.  She c/o increased bp, nausea, body quiver (tremor).  She is better.  Any other recs?  I gave her 3 to PT neuro rehab next door to call.

## 2019-05-07 NOTE — Telephone Encounter (Signed)
Patient called in and stated that her BP has been runnign high since she started the DULoxetine (CYMBALTA) 30 MG capsule  she states her BP was up to 170/89

## 2019-05-07 NOTE — Telephone Encounter (Signed)
Please ensure that she has discontinued her Cymbalta due to side effect of elevated blood pressure.  Recommend initiating Lexapro 10 mg daily due to ongoing anxiety.  Please let me know if she is interested in initiating and an order will be placed.  Thank you.

## 2019-05-08 NOTE — Telephone Encounter (Signed)
I called pt and relayed that per JM/NP she could try lexapro for ongoing anxiety.  She will research this and let us know if this is something she wants to try.  She had stopped the duloxetine Sunday pm. She is better.  Still with the quivering inside.

## 2019-05-17 ENCOUNTER — Ambulatory Visit: Payer: Medicare Other | Attending: Internal Medicine

## 2019-05-17 DIAGNOSIS — Z23 Encounter for immunization: Secondary | ICD-10-CM | POA: Insufficient documentation

## 2019-05-17 NOTE — Progress Notes (Signed)
   Covid-19 Vaccination Clinic  Name:  Mary Mercado    MRN: [id number] DOB: [birth year]  05/17/2019  Mary Mercado was observed post Covid-19 immunization for 15 minutes without incidence. She was provided with Vaccine Information Sheet and instruction to access the V-Safe system.   Mary Mercado was instructed to call 911 with any severe reactions post vaccine: Marland Kitchen Difficulty breathing  . Swelling of your face and throat  . A fast heartbeat  . A bad rash all over your body  . Dizziness and weakness    Immunizations Administered    Name Date Dose VIS Date Route   Pfizer COVID-19 Vaccine 05/17/2019  2:03 PM 0.3 mL 04/05/2019 Intramuscular   Manufacturer: [place]   Lot: [id number]   [place]: [id number]

## 2019-05-22 ENCOUNTER — Ambulatory Visit: Payer: Medicare Other | Admitting: Adult Health

## 2019-05-24 ENCOUNTER — Other Ambulatory Visit: Payer: Self-pay | Admitting: Adult Health

## 2019-05-24 ENCOUNTER — Other Ambulatory Visit: Payer: Self-pay | Admitting: Cardiovascular Disease

## 2019-06-03 ENCOUNTER — Other Ambulatory Visit: Payer: Self-pay

## 2019-06-03 ENCOUNTER — Telehealth: Payer: Self-pay

## 2019-06-03 ENCOUNTER — Telehealth (INDEPENDENT_AMBULATORY_CARE_PROVIDER_SITE_OTHER): Payer: Medicare Other | Admitting: Family Medicine

## 2019-06-03 DIAGNOSIS — I6502 Occlusion and stenosis of left vertebral artery: Secondary | ICD-10-CM | POA: Diagnosis not present

## 2019-06-03 DIAGNOSIS — R42 Dizziness and giddiness: Secondary | ICD-10-CM

## 2019-06-03 MED ORDER — MECLIZINE HCL 25 MG PO TABS: 25.0000 mg | ORAL_TABLET | ORAL | 2 refills | Status: DC | PRN

## 2019-06-03 NOTE — Telephone Encounter (Signed)
Tell them to change the directions to every 6 hours as needed

## 2019-06-03 NOTE — Telephone Encounter (Signed)
Spoke with the pharmacy. Rx directions have been changed.

## 2019-06-03 NOTE — Progress Notes (Signed)
Virtual Visit via Telephone Note  I connected with the patient on 06/03/19 at 11:30 AM EST by telephone and verified that I am speaking with the correct person using two identifiers.   I discussed the limitations, risks, security and privacy concerns of performing an evaluation and management service by telephone and the availability of in person appointments. I also discussed with the patient that there may be a patient responsible charge related to this service. The patient expressed understanding and agreed to proceed.  Location patient: home Location provider: work or home office Participants present for the call: patient, provider Patient did not have a visit in the prior 7 days to address this/these issue(s).   History of Present Illness: Here for 2 weeks of intermittent dizziness which makes her feel as if the room is spinning around. This occurs when she stands up or moves her head quickly, and it settles down when she is still. No ear pain or headache. No nausea. She has a hx of vertigo some years ago. She saw Neurology on 05-02-19 and she seemed to be doing well after the stroke last year.    Observations/Objective: Patient sounds cheerful and well on the phone. I do not appreciate any SOB. Speech and thought processing are grossly intact. Patient reported vitals:  Assessment and Plan: Vertigo. She will try Meclizine as needed. Recheck prn.  [name], MD   Follow Up Instructions:     [phone] 5-10 [phone] 11-20 [id number] 21-30 I did not refer this patient for an OV in the next 24 hours for this/these issue(s).  I discussed the assessment and treatment plan with the patient. The patient was provided an opportunity to ask questions and all were answered. The patient agreed with the plan and demonstrated an understanding of the instructions.   The patient was advised to call back or seek an in-person evaluation if the symptoms worsen or if the condition fails to improve as  anticipated.  I provided 12 minutes of non-face-to-face time during this encounter.   [name], MD

## 2019-06-03 NOTE — Telephone Encounter (Signed)
Patients pharmacy called in and stated that the max dose for meclizine is 100mg  and the current Rx is written for 150mg  a day.  Please advise.

## 2019-06-07 ENCOUNTER — Ambulatory Visit: Payer: Medicare Other | Attending: Internal Medicine

## 2019-06-07 DIAGNOSIS — Z23 Encounter for immunization: Secondary | ICD-10-CM | POA: Insufficient documentation

## 2019-06-07 NOTE — Progress Notes (Signed)
   Covid-19 Vaccination Clinic  Name:  Mary Mercado    MRN: [id number] DOB: 10/10/1940  06/07/2019  [name] was observed post Covid-19 immunization for 15 minutes without incidence. She was provided with Vaccine Information Sheet and instruction to access the V-Safe system.   [name] was instructed to call 911 with any severe reactions post vaccine: Marland Kitchen Difficulty breathing  . Swelling of your face and throat  . A fast heartbeat  . A bad rash all over your body  . Dizziness and weakness    Immunizations Administered    Name Date Dose VIS Date Route   Pfizer COVID-19 Vaccine 06/07/2019  8:33 AM 0.3 mL 04/05/2019 Intramuscular   Manufacturer: [place]   Lot: [id number]   [place]: [id number]

## 2019-06-12 ENCOUNTER — Ambulatory Visit: Payer: Medicare Other

## 2019-07-12 ENCOUNTER — Other Ambulatory Visit: Payer: Self-pay | Admitting: Family Medicine

## 2019-07-30 ENCOUNTER — Encounter: Payer: Self-pay | Admitting: Family Medicine

## 2019-07-30 ENCOUNTER — Ambulatory Visit (INDEPENDENT_AMBULATORY_CARE_PROVIDER_SITE_OTHER): Payer: Medicare Other | Admitting: Family Medicine

## 2019-07-30 ENCOUNTER — Other Ambulatory Visit: Payer: Self-pay

## 2019-07-30 ENCOUNTER — Ambulatory Visit (INDEPENDENT_AMBULATORY_CARE_PROVIDER_SITE_OTHER): Payer: Medicare Other

## 2019-07-30 VITALS — BP 124/64 | HR 76 | Temp 97.9°F | Wt 141.0 lb

## 2019-07-30 DIAGNOSIS — R0781 Pleurodynia: Secondary | ICD-10-CM | POA: Diagnosis not present

## 2019-07-30 DIAGNOSIS — I6502 Occlusion and stenosis of left vertebral artery: Secondary | ICD-10-CM

## 2019-07-30 MED ORDER — TRAMADOL HCL 50 MG PO TABS: 50.0000 mg | ORAL_TABLET | Freq: Four times a day (QID) | ORAL | 0 refills | Status: DC | PRN

## 2019-07-30 NOTE — Progress Notes (Signed)
   Subjective:    Patient ID: Mary Mercado, female    DOB: 1940-11-26, 79 y.o.   MRN: [id number]  HPI Here for one month of intermittent sharp pains at the bottom of the left ribs. No cough or SOB. No change in bowel or urinary habits. No fever. No recent trauma.    Review of Systems  Constitutional: Negative.   Respiratory: Negative.   Cardiovascular: Positive for chest pain. Negative for palpitations and leg swelling.  Gastrointestinal: Negative.   Genitourinary: Negative.        Objective:   Physical Exam Constitutional:      Appearance: Normal appearance. She is not ill-appearing.  Cardiovascular:     Rate and Rhythm: Normal rate and regular rhythm.     Pulses: Normal pulses.     Heart sounds: Normal heart sounds.  Pulmonary:     Effort: Pulmonary effort is normal.     Breath sounds: Normal breath sounds.     Comments: She is very tender in a well localized area at the inferior margin of the left anterior ribs  Abdominal:     General: Abdomen is flat. Bowel sounds are normal. There is no distension.     Palpations: Abdomen is soft. There is no mass.     Tenderness: There is no abdominal tenderness. There is no guarding or rebound.     Hernia: No hernia is present.  Neurological:     Mental Status: She is alert.           Assessment & Plan:  Rib pain. We will get rib Xrays to evaluate thie further. Use Tramadol prn pain. [name], MD

## 2019-08-01 ENCOUNTER — Other Ambulatory Visit: Payer: Self-pay

## 2019-08-01 ENCOUNTER — Ambulatory Visit (INDEPENDENT_AMBULATORY_CARE_PROVIDER_SITE_OTHER): Payer: Medicare Other | Admitting: Adult Health

## 2019-08-01 ENCOUNTER — Encounter: Payer: Self-pay | Admitting: Adult Health

## 2019-08-01 VITALS — BP 132/74 | HR 73 | Temp 97.1°F | Ht 65.0 in | Wt 141.0 lb

## 2019-08-01 DIAGNOSIS — I6502 Occlusion and stenosis of left vertebral artery: Secondary | ICD-10-CM

## 2019-08-01 DIAGNOSIS — R29818 Other symptoms and signs involving the nervous system: Secondary | ICD-10-CM

## 2019-08-01 DIAGNOSIS — F4322 Adjustment disorder with anxiety: Secondary | ICD-10-CM | POA: Diagnosis not present

## 2019-08-01 DIAGNOSIS — R269 Unspecified abnormalities of gait and mobility: Secondary | ICD-10-CM

## 2019-08-01 DIAGNOSIS — R202 Paresthesia of skin: Secondary | ICD-10-CM | POA: Diagnosis not present

## 2019-08-01 DIAGNOSIS — I480 Paroxysmal atrial fibrillation: Secondary | ICD-10-CM

## 2019-08-01 DIAGNOSIS — I69398 Other sequelae of cerebral infarction: Secondary | ICD-10-CM

## 2019-08-01 DIAGNOSIS — I63411 Cerebral infarction due to embolism of right middle cerebral artery: Secondary | ICD-10-CM | POA: Diagnosis not present

## 2019-08-01 NOTE — Progress Notes (Signed)
[facility] [address] [place]. [place] [address] [phone]       OFFICE FOLLOW-UP NOTE  [name] Date of Birth:  Feb 04, 1941 Medical Record Number:  [id number]   Reason for visit: Stroke follow-up  Chief complaint: Chief Complaint  Patient presents with  . Follow-up    with husband, rm 63, CVA, history of stroke       HPI:   [name] is a 79 year old female who is being seen today, 08/01/2019, for stroke follow-up with residual left-sided paresthesias and post stroke anxiety accompanied by her husband.  Initiated Cymbalta at prior visit due to ongoing paresthesias and severe anxiety but unfortunately caused hypertension therefore advised to discontinue. Paresthesias have been stable from prior visit without worsening with intermittent symptoms that occur randomly.  She does continue to have occasional balance difficulties and if ambulating or standing for too long she will start to feel "off".  Referral placed at prior visit to PT but apparently has not been called to schedule initial evaluation.  She continues to experience anxiety which has only been present for stroke but does feel slight improvement since prior visit.  Remains on Eliquis and atorvastatin without side effects.  Blood pressure today 132/74.  No further concerns at this time.    History: Copied from prior note for reference purposes only Update 05/02/2019: [name] is a 79 year old female who is being seen today for stroke follow-up accompanied by her husband.  Residual deficits left-sided paresthesias consisting of numbness/tingling and occasional burning sensation.  She also endorses episodes of sensation of full body "intermittently shaking" and is usually worsened by stress, anxiety or fatigue.  Usually accompanied by nausea but denies headache or dizziness.  She becomes fearful during those times that she may be having a heart attack.  When questioned regarding  anxiety, she does endorse minimal anxiety but after doing GAD-7 test, score of 20/21 showing severe anxiety.  She denies any prior history or family history of depression or anxiety.  She continues on Eliquis without bleeding or bruising.  She continues on atorvastatin without myalgias.  Blood pressure today 140/81.  She denies any additional episodes of dizziness and continues to follow with cardiology for blood pressure management.  She has recently decreased telmisartan as it is recommended to continue blood pressure between 130-150 due to vertebral artery stenosis.  No further concerns at this time.   Update 11/19/2018 Dr. Leonie Man: She is seen today for office follow-up visit following initial video follow-up visit on 08/13/2018.  She is accompanied by her husband.  She states she continues to have left leg paresthesias and numbness.  She has some mild gait and balance difficulties.  At times she noticed that the legs are trembling and she is initiating walk and she has to hold on something and the feeling goes away.  She has also been started on several new cardiac medications per her ablation for A. fib and feels medication may be the cause for her dizziness.  She is tolerating Eliquis well without bruising or bleeding.  Blood pressures well controlled and today it is 136/79.  She remains on Lipitor which is tolerating well without muscle aches and pains.  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  Initial video visit 08/13/2018 Dr. Leonie Man : This is a initial video virtual consultation visit on [name] who was admitted to [facility] in February  2020 with a stroke.  I have personally obtained history of presenting illness from the patient and her husband and reviewed electronic medical records as well as imaging films in PACS.  She was admitted on 05/31/2018 with sudden onset of left arm and leg weakness and numbness as  well as feeling dizzy and had one episode of emesis.  She had CT scan and CT angiogram in the ER which showed no large vessel occlusion she was given IV TPA and admitted to the neurological intensive care unit.  NIH stroke scale on admission was 4.  At baseline modified Rankin score was 0.  Patient had tight blood pressure control.  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  Transthoracic echo showed normal ejection fraction.  Transesophageal echocardiogram showed no cardiac source of embolism or PFO.  LDL cholesterol was elevated at 1 1 7  mg percent.  Hemoglobin A1c was 5.9.  Urine drug screen was negative.  Lower extremity venous Dopplers were negative for DVT.  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  Patient was started on aspirin 81 and Plavix 75 mg daily for 3 weeks and underwent an outpatient 30-day heart monitor which subsequently showed paroxysmal atrial fibrillation.  She has since then stopped aspirin and Plavix and has been switched to Eliquis.  Patient states she has done well since discharge.  She was initially transferred to inpatient rehab where she stayed for a few weeks and did well.  She is still has left-sided numbness but feels some of the sensation may be coming back now.  She can walk independently without assistance though her husband stays close by.  She has had no falls or injuries.  She is tolerating Eliquis well without bleeding or bruising.  She is also tolerating Lipitor well.  She complains of little bit of jitteriness and tiredness but she blames this likely on her new A. fib medication.  She has no new complaints.  She has no prior history of strokes TIAs.       ROS:   14 system review of systems is  positive for numbness/tingling, pain, anxiety and all other systems negative  PMH:  Past Medical History:  Diagnosis Date  . Allergy   . CVA (cerebral vascular accident) ([place])   . Hyperlipidemia LDL goal <70   . Hypertension   . Palpitations   . Vertigo, benign positional     Social History:  Social History   Socioeconomic History  . Marital status: Married    Spouse name: Not on file  . Number of children: Not on file  . Years of education: Not on file  . Highest education level: Not on file  Occupational History  . Not on file  Tobacco Use  . Smoking status: Never Smoker  . Smokeless tobacco: Never Used  Substance and Sexual Activity  . Alcohol use: No  . Drug use: No  . Sexual activity: Not on file  Other Topics Concern  . Not on file  Social History Narrative  . Not on file   Social Determinants of Health   Financial Resource Strain:   . Difficulty of Paying Living Expenses:   Food Insecurity:   . Worried About Charity fundraiser in the Last Year:   . [employer]  of Food in the Last Year:   Transportation Needs:   . Film/video editor (Medical):   Marland Kitchen Lack of Transportation (Non-Medical):   Physical Activity:   . Days of Exercise per Week:   . Minutes of Exercise per Session:   Stress:   . Feeling of Stress :   Social Connections:   . Frequency of Communication with Friends and Family:   . Frequency of Social Gatherings with Friends and Family:   . Attends Religious Services:   . Active Member of Clubs or Organizations:   . Attends Archivist Meetings:   Marland Kitchen Marital Status:   Intimate Partner Violence:   . Fear of Current or Ex-Partner:   . Emotionally Abused:   Marland Kitchen Physically Abused:   . Sexually Abused:     Medications:   Current Outpatient Medications on File Prior to Visit  Medication Sig Dispense Refill  . atorvastatin (LIPITOR) 40 MG tablet TAKE 1 TABLET BY MOUTH EVERY DAY AT 6 PM 90 tablet 1  . diltiazem (CARDIZEM CD) 180 MG 24 hr  capsule TAKE 1 CAPSULE BY MOUTH EVERY DAY 90 capsule 2  . ELIQUIS 5 MG TABS tablet TAKE 1 TABLET BY MOUTH TWICE A DAY 60 tablet 10  . telmisartan (MICARDIS) 20 MG tablet Take 0.5 tablets (10 mg total) by mouth daily. 45 tablet 3  . traMADol (ULTRAM) 50 MG tablet Take 1 tablet (50 mg total) by mouth every 6 (six) hours as needed for moderate pain. 60 tablet 0   No current facility-administered medications on file prior to visit.    Allergies:   Allergies  Allergen Reactions  . Duloxetine     Causes Bp elevation.  Increased body jerks, nausea  . Acetaminophen Hives and Rash    Physical Exam  Today's Vitals   08/01/19 1238  BP: 132/74  Pulse: 73  Temp: (!) 97.1 F (36.2 C)  Weight: 141 lb (64 kg)  Height: 5\' 5"  (1.651 m)   Body mass index is 23.46 kg/m.   GAD 7 : Generalized Anxiety Score 08/01/2019 05/02/2019  Nervous, Anxious, on Edge 1 3  Control/stop worrying 1 3  Worry too much - different things 1 3  Trouble relaxing 1 3  Restless 0 2  Easily annoyed or irritable 2 3  Afraid - awful might happen 2 3  Total GAD 7 Score 8 20  Anxiety Difficulty Somewhat difficult -    General: frail pleasant elderly Caucasian lady seated, in no evident distress Head: head normocephalic and atraumatic.  Neck: supple with no carotid or supraclavicular bruits Cardiovascular: regular rate and rhythm, no murmurs Musculoskeletal: no deformity Skin:  no rash/petichiae Vascular:  Normal pulses all extremities   Neurologic Exam Mental Status: Awake and fully alert. Normal speech and language. Oriented to place and time. Recent and remote memory intact. Attention span, concentration and fund of knowledge appropriate. Mood and affect appropriate Cranial Nerves: Pupils equal, briskly reactive to light. Extraocular movements full without nystagmus. Visual fields full to confrontation. Hearing intact. Facial sensation intact. Face, tongue, palate moves normally and symmetrically.  Motor: Normal  bulk and tone. Normal strength in all tested extremity muscles except L>R hip flexor weakness. Sensory.:  Decreased left-sided vibratory sensation and light touch sensation compared to right side Coordination: Rapid alternating movements normal in all extremities. Finger-to-nose and heel-to-shin performed accurately bilaterally.  Mild action tremors bilateral upper extremities.  No tremors at rest. Gait and Station: Arises from chair without difficulty. Stance is normal. Gait  demonstrates normal stride length and balance with walking straight but greater difficulty with turns. Able to heel, toe and tandem walk with mild difficulty.  Romberg negative. Reflexes: 1+ and symmetric. Toes downgoing.       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  Vascular risk factors of hypertension, hyperlipidemia, new onset A. fib and age.  Residual deficits of left-sided paresthesias, left hemisensory impairment and imbalance.      PLAN: -Referral previously placed for PT and advised patient to schedule initial evaluation upon leaving today with ongoing imbalance, hip flexor weakness and paresthesias -Post stroke anxiety with adjustment disorder improved from prior visit but continues to have difficulties.  Referral placed to neuropsychology for further assistance - not interested in medication management at this time -Repeat carotid ultrasound for surveillance monitoring of known left vertebral artery stenosis -Continue Eliquis (apixaban) daily and atorvastatin for secondary stroke prevention -Continue to follow with cardiology for atrial fibrillation and Eliquis management -maintain strict control of hypertension with blood pressure goal below 130/90, diabetes with hemoglobin A1c goal below 6.5% and lipids with LDL cholesterol goal below 70 mg/dL. I also advised the patient to eat a  healthy diet with plenty of whole grains, cereals, fruits and vegetables, exercise regularly and maintain ideal body weight    Follow-up in 6 months or call earlier as needed   I spent 26 minutes of face-to-face and non-face-to-face time with patient and husband.  This included previsit chart review, lab review, study review, order entry, electronic health record documentation, patient education regarding prior stroke with residual deficits, poststroke anxiety with adjustment disorder, importance of ongoing management of stroke risk factors and answered all questions to patient and husband satisfaction   Frann Rider, AGNP-BC  [facility] [address] [place], [place] [address]  Phone [phone] Fax [phone] Note: This document was prepared with digital dictation and possible smart phrase technology. Any transcriptional errors that result from this process are unintentional.

## 2019-08-01 NOTE — Patient Instructions (Signed)
Please stop into neuro rehab to schedule initial evaluation for therapy with hopeful benefits of ambulation, left hip flexor weakness and endurance/tolerance  Replace order for carotid ultrasound for surveillance monitoring of known artery stenosis -please call office after 1 week if you have not been called to schedule study  Continue Eliquis (apixaban) daily  and atorvastatin for secondary stroke prevention  Continue to follow up with PCP regarding cholesterol and blood pressure management   Continue to follow with cardiology for atrial fibrillation and Eliquis management  Continue to monitor blood pressure at home  Maintain strict control of hypertension with blood pressure goal below 130/90, diabetes with hemoglobin A1c goal below 6.5% and cholesterol with LDL cholesterol (bad cholesterol) goal below 70 mg/dL. I also advised the patient to eat a healthy diet with plenty of whole grains, cereals, fruits and vegetables, exercise regularly and maintain ideal body weight.  Followup in the future with me in 6 months or call earlier if needed       Thank you for coming to see [place] at [facility]. I hope we have been able to provide you high quality care today.  You may receive a patient satisfaction survey over the next few weeks. We would appreciate your feedback and comments so that we may continue to improve ourselves and the health of our patients.

## 2019-08-01 NOTE — Progress Notes (Signed)
I agree with the above plan 

## 2019-08-02 ENCOUNTER — Ambulatory Visit: Payer: Medicare Other | Attending: Adult Health | Admitting: Physical Therapy

## 2019-08-02 ENCOUNTER — Other Ambulatory Visit: Payer: Self-pay

## 2019-08-02 DIAGNOSIS — R2689 Other abnormalities of gait and mobility: Secondary | ICD-10-CM | POA: Diagnosis not present

## 2019-08-02 DIAGNOSIS — R278 Other lack of coordination: Secondary | ICD-10-CM | POA: Insufficient documentation

## 2019-08-02 DIAGNOSIS — R2681 Unsteadiness on feet: Secondary | ICD-10-CM

## 2019-08-02 DIAGNOSIS — R209 Unspecified disturbances of skin sensation: Secondary | ICD-10-CM | POA: Insufficient documentation

## 2019-08-02 DIAGNOSIS — R208 Other disturbances of skin sensation: Secondary | ICD-10-CM

## 2019-08-02 DIAGNOSIS — M6281 Muscle weakness (generalized): Secondary | ICD-10-CM | POA: Diagnosis not present

## 2019-08-03 NOTE — Therapy (Signed)
[facility] [address] [place], [place], [address] Phone: [phone]   Fax:  [phone]  Physical Therapy Evaluation  Patient Details  Name: Mary Mercado MRN: [id number] Date of Birth: 1940/05/09 Referring Provider (PT): Frann Rider, NP   Encounter Date: 08/02/2019  PT End of Session - 08/03/19 0930    Visit Number  1    Number of Visits  17    Date for PT Re-Evaluation  11/01/19   written for 60 day POC   Authorization Type  Medicare, BCBS (will need 10th visit progress note)    PT Start Time  1018    PT Stop Time  1100    PT Time Calculation (min)  42 min    Equipment Utilized During Treatment  Gait belt    Activity Tolerance  Patient tolerated treatment well    Behavior During Therapy  WFL for tasks assessed/performed       Past Medical History:  Diagnosis Date  . Allergy   . CVA (cerebral vascular accident) ([place])   . Hyperlipidemia LDL goal <70   . Hypertension   . Palpitations   . Vertigo, benign positional     Past Surgical History:  Procedure Laterality Date  . CARDIOVERSION Left 10/31/2018   Procedure: CARDIOVERSION;  Surgeon: Acie Fredrickson Wonda Cheng, MD;  Location: [facility] ENDOSCOPY;  Service: Cardiovascular;  Laterality: Left;  . Hemilaminectomy and microdiskectomy at L4-5 on the left.  12/10/2003  . TEE WITHOUT CARDIOVERSION N/A 06/05/2018   Procedure: TRANSESOPHAGEAL ECHOCARDIOGRAM (TEE);  Surgeon: Lelon Perla, MD;  Location: [facility] ENDOSCOPY;  Service: Cardiovascular;  Laterality: N/A;  loop    There were no vitals filed for this visit.   Subjective Assessment - 08/02/19 1021    Subjective  Pt had a stroke, with L sided numbness on 06/05/2018. Has periods of time when it hurts on her L side and her L foot feels like a block of wood. Feels like her "insides get tense". Can't stand for a long period of time -starts feeling off balanced. Can go out for a short walk. No falls. Had a L  metatarsal fracture in November - couldn't walk for about a month or so which set her back. Has soreness/pain under L rib cage - had an x-ray and didn't show anything.    Patient is accompained by:  Family member   husband, [name]   Pertinent History  PMH: CVA (2020), HTN, HLD    Limitations  Standing;Walking    How long can you stand comfortably?  5 minutes    How long can you walk comfortably?  10-15 minutes    Patient Stated Goals  wants to feel good - wants to be able to walk and not feel crazy    Currently in Pain?  Yes    Pain Score  3     Pain Location  --   L rib cage   Pain Orientation  Left    Pain Descriptors / Indicators  Sore         [facility] PT Assessment - 08/02/19 1029      Assessment   Medical Diagnosis  CVA-L weakness    Referring Provider (PT)  Frann Rider, NP    Onset Date/Surgical Date  05/31/18    Hand Dominance  Right    Prior Therapy  just here for PT eval - never came back due to clinic closing due to COVID      Precautions   Precautions  Fall      Balance Screen   Has the patient fallen in the past 6 months  No    Has the patient had a decrease in activity level because of a fear of falling?   Yes    Is the patient reluctant to leave their home because of a fear of falling?   No      Home Environment   Living Environment  Private residence    Living Arrangements  Spouse/significant other    Available Help at Discharge  Family    Type of [place] Access  Level entry    [place]  Two level;Able to live on main level with bedroom/bathroom   can go up and down stairs, goes backwards   Alternate Level Stairs-Number of Steps  12    Alternate Level Stairs-Rails  Right    Home Equipment  Cane - single point;Shower seat    Additional Comments  uses SPC for longer distances      Prior Function   Level of Independence  Independent    Vocation  Retired    Building control surveyor    Leisure  used to enjoy singing in the  choir, going out with husband      Editor, commissioning  Impaired Detail    Proprioception  Impaired by gross assessment   can detect on L, but less pronounced    Additional Comments  intact more proximally with LLE, more distally unable to feel or can only detect very faintly       Coordination   Gross Motor Movements are Fluid and Coordinated  Yes      ROM / Strength   AROM / PROM / Strength  Strength      Strength   Strength Assessment Site  Hip;Knee;Ankle    Right/Left Hip  Right    Right Hip Flexion  4+/5    Left Hip Flexion  4/5    Right/Left Knee  Right    Right Knee Flexion  5/5    Right Knee Extension  5/5    Left Knee Flexion  4/5    Left Knee Extension  5/5    Right/Left Ankle  Right    Right Ankle Dorsiflexion  5/5    Left Ankle Dorsiflexion  5/5      Transfers   Transfers  Sit to Stand;Stand to Sit    Sit to Stand  Without upper extremity assist;From chair/3-in-1    Five time sit to stand comments   19.72   wide   Stand to Sit  5: Supervision;Without upper extremity assist;To chair/3-in-1      Ambulation/Gait   Ambulation/Gait  Yes    Ambulation/Gait Assistance  5: Supervision;4: Min guard    Ambulation Distance (Feet)  115 Feet    Assistive device  None    Gait Pattern  Step-through pattern;Decreased arm swing - left;Decreased step length - left;Decreased stance time - left;Decreased dorsiflexion - left;Decreased weight shift to left;Decreased trunk rotation;Decreased stride length    Ambulation Surface  Level;Indoor    Gait velocity  12.65 seconds = 2.59 ft/sec      Dynamic Gait Index   Level Surface  Mild Impairment    Change in Gait Speed  Mild Impairment    Gait with Horizontal Head Turns  Moderate Impairment    Gait with Vertical Head Turns  Severe Impairment    Gait and Pivot Turn  Severe Impairment    Step Over Obstacle  Moderate Impairment    Step Around Obstacles  Moderate Impairment    Steps  Mild Impairment    Total Score  9    DGI  comment:  9/24                Objective measurements completed on examination: See above findings.              PT Education - 08/03/19 0930    Education Details  clinical findings, POC, fall risk    Person(s) Educated  Patient;Spouse    Methods  Explanation    Comprehension  Verbalized understanding       PT Short Term Goals - 08/03/19 0940      PT SHORT TERM GOAL #1   Title  Pt will be independent with HEP for improved strength, balance, gait.  ALL STGS DUE 08/31/19    Time  4    Period  Weeks    Status  New    Target Date  08/31/19      PT SHORT TERM GOAL #2   Title  Pt will improve DGI score to at least 12/24 in order to demo decr fall risk.    Baseline  9/24    Time  4    Period  Weeks    Status  New      PT SHORT TERM GOAL #3   Title  Pt will improve gait speed to at least 2.9 ft/sec to demo improved gait efficiency and decr fall risk.    Baseline  2.59 ft/sec    Time  4    Period  Weeks    Status  New      PT SHORT TERM GOAL #4   Title  Pt will decr 5x sit <> stand to 17 seconds or less without UE support from standard chair to demo improved functional LE strength.    Time  4    Period  Weeks    Status  New      PT SHORT TERM GOAL #5   Title  Pt will verbalize understanding of fall prevention in home environment.    Time  4    Period  Weeks    Status  New        PT Long Term Goals - 08/03/19 0946      PT LONG TERM GOAL #1   Title  Pt will verbalize plans for return to community fitness upon d/c from PT and be independent with final HEP.  ALL LTGS DUE 09/28/19    Time  8    Period  Weeks    Status  New    Target Date  09/28/19      PT LONG TERM GOAL #2   Title  Pt will improve gait velocity to at least 3.2 ft/sec for improved gait efficiency    Baseline  2.59 ft/sec    Time  8    Period  Weeks    Status  New      PT LONG TERM GOAL #3   Title  Pt will improve DGI score to at least 16/24 in order to demo decr fall risk.    Time   8    Period  Weeks    Status  New      PT LONG TERM GOAL #4   Title  Pt will ambulate at least 1000 ft, indoors and outdoors, independently, no LOB for improved  gait safety and efficiency in community.    Time  8    Period  Weeks    Status  New      PT LONG TERM GOAL #5   Title  Pt will decr 5x sit <> stand to 15 seconds or less without UE support from standard chair to demo improved functional LE strength.    Time  8    Period  Weeks    Status  New             Plan - 08/03/19 0931    Clinical Impression Statement  Pt is a 79 year old female who presents to [facility] following CVA with LLE weakness and decreased sensation, 05/31/2018. Pt had initial PT eval at this location a year ago, but never returned for treatment due to clinic closing due to [place]. Prior to CVA, pt was independent. She presents with decreased strength, decreased sensation, decreased balance, decreased safety/independence with gait. She demonstrates decreased gait velocity (limited community ambulator) and fall risk per DGI score. Pt would benefit from skilled PT to address the above stated deficits to decrease fall risk and improve functional mobility/return to independence.    Personal Factors and Comorbidities  Comorbidity 3+;Time since onset of injury/illness/exacerbation   CVA 05/31/2018; independent and active prior to CVA   Comorbidities  HTN, HLD, SVT, Essential HTN, vertigo (BPPV), hemilaminectomy/miscrodiscectomy 2005    Examination-Activity Limitations  Locomotion Level;Stairs;Stand;Transfers    Examination-Participation Restrictions  Church;Community Activity    Stability/Clinical Decision Making  Stable/Uncomplicated    Clinical Decision Making  Low    Rehab Potential  Good    PT Frequency  2x / week    PT Duration  8 weeks    PT Treatment/Interventions  ADLs/Self Care Home Management;DME Instruction;Gait training;Stair training;Functional mobility training;Therapeutic activities;Therapeutic  exercise;Balance training;Neuromuscular re-education;Patient/family education;Vestibular    PT Next Visit Plan  initial HEP for functional LE strength and balance, 6MWT and write goal as appropriate, might need vestibular assessment? (pt reporting some unsteadiness/not sure if she is dizzy throughout session)    Consulted and Agree with Plan of Care  Patient;Family member/caregiver    Family Member Consulted  Husband       Patient will benefit from skilled therapeutic intervention in order to improve the following deficits and impairments:  Abnormal gait, Decreased balance, Decreased mobility, Difficulty walking, Decreased strength, Postural dysfunction, Dizziness, Impaired sensation  Visit Diagnosis: Other lack of coordination  Muscle weakness (generalized)  Unsteadiness on feet  Other disturbances of skin sensation  Other abnormalities of gait and mobility     Problem List Patient Active Problem List   Diagnosis Date Noted  . Dizziness 11/30/2018  . Atrial fibrillation ([place]) 08/13/2018  . Prediabetes 06/15/2018  . Palpitations 06/05/2018  . Essential hypertension 06/05/2018  . Hyperlipidemia LDL goal <70 06/05/2018  . Acute ischemic right MCA stroke ([place]) 06/05/2018  . 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 SVT (supraventricular tachycardia) ([place]) 05/28/2015  . PVC's (premature ventricular contractions) 05/28/2015  . BENIGN POSITIONAL VERTIGO 06/16/2009  . ALLERGIC RHINITIS 06/16/2009    Arliss Journey, PT, DPT  08/03/2019, 9:54 AM  [facility] [address] [place], [place], [address] Phone: [phone]   Fax:  [phone]  Name: Mary Mercado MRN: [id number] Date of Birth: 22-Nov-1940

## 2019-08-05 ENCOUNTER — Other Ambulatory Visit: Payer: Self-pay

## 2019-08-05 ENCOUNTER — Ambulatory Visit (HOSPITAL_COMMUNITY)
Admission: RE | Admit: 2019-08-05 | Discharge: 2019-08-05 | Disposition: A | Discharge status: Home / Self Care | Payer: Medicare Other | Source: Ambulatory Visit | Attending: Adult Health | Admitting: Adult Health

## 2019-08-05 ENCOUNTER — Telehealth: Payer: Self-pay

## 2019-08-05 DIAGNOSIS — I6502 Occlusion and stenosis of left vertebral artery: Secondary | ICD-10-CM | POA: Insufficient documentation

## 2019-08-05 NOTE — Telephone Encounter (Signed)
Pt verified by name and DOB, results given per provider, pt voiced understanding all question answered. 

## 2019-08-05 NOTE — Progress Notes (Signed)
Carotid artery duplex completed. Refer to "CV Proc" under chart review to view preliminary results.  08/05/2019 11:13 AM Kelby Aline., MHA, RVT, RDCS, RDMS

## 2019-08-06 DIAGNOSIS — Z8673 Personal history of transient ischemic attack (TIA), and cerebral infarction without residual deficits: Secondary | ICD-10-CM | POA: Diagnosis not present

## 2019-08-06 DIAGNOSIS — H40003 Preglaucoma, unspecified, bilateral: Secondary | ICD-10-CM | POA: Diagnosis not present

## 2019-08-06 DIAGNOSIS — H5231 Anisometropia: Secondary | ICD-10-CM | POA: Diagnosis not present

## 2019-08-06 DIAGNOSIS — H524 Presbyopia: Secondary | ICD-10-CM | POA: Diagnosis not present

## 2019-08-06 DIAGNOSIS — H35363 Drusen (degenerative) of macula, bilateral: Secondary | ICD-10-CM | POA: Diagnosis not present

## 2019-08-06 DIAGNOSIS — H40031 Anatomical narrow angle, right eye: Secondary | ICD-10-CM | POA: Diagnosis not present

## 2019-08-06 DIAGNOSIS — H2511 Age-related nuclear cataract, right eye: Secondary | ICD-10-CM | POA: Diagnosis not present

## 2019-08-06 DIAGNOSIS — H5210 Myopia, unspecified eye: Secondary | ICD-10-CM | POA: Diagnosis not present

## 2019-08-07 ENCOUNTER — Ambulatory Visit: Payer: Medicare Other

## 2019-08-08 ENCOUNTER — Telehealth: Payer: Self-pay | Admitting: Cardiovascular Disease

## 2019-08-08 NOTE — Telephone Encounter (Signed)
Noted  

## 2019-08-08 NOTE — Telephone Encounter (Signed)
New Message  Patient states that her husband will be coming with her to the office visit with Dr. Acie Fredrickson. Patient states that she has difficulty in walking.

## 2019-08-09 ENCOUNTER — Encounter: Payer: Self-pay | Admitting: Cardiovascular Disease

## 2019-08-09 ENCOUNTER — Other Ambulatory Visit: Payer: Self-pay

## 2019-08-09 ENCOUNTER — Ambulatory Visit (INDEPENDENT_AMBULATORY_CARE_PROVIDER_SITE_OTHER): Payer: Medicare Other | Admitting: Cardiovascular Disease

## 2019-08-09 ENCOUNTER — Ambulatory Visit: Payer: Medicare Other

## 2019-08-09 VITALS — BP 122/74 | HR 68 | Ht 65.0 in | Wt 143.5 lb

## 2019-08-09 DIAGNOSIS — I4819 Other persistent atrial fibrillation: Secondary | ICD-10-CM | POA: Diagnosis not present

## 2019-08-09 DIAGNOSIS — I471 Supraventricular tachycardia: Secondary | ICD-10-CM

## 2019-08-09 DIAGNOSIS — I6502 Occlusion and stenosis of left vertebral artery: Secondary | ICD-10-CM

## 2019-08-09 NOTE — Addendum Note (Signed)
Addended by: Georgiann Cocker on: 08/09/2019 04:29 PM   Modules accepted: Orders

## 2019-08-09 NOTE — Progress Notes (Signed)
Cardiology Office Note:    Date:  08/09/2019   ID:  Mercado Mercado, Mercado 03/12/1941, MRN [id number]  PCP:  Mercado Morale, MD  Cardiologist:  Mercado Moores, MD  Electrophysiologist:  None   Referring MD: Mercado Morale, MD    Problem List 1. Atrial Fib:    CHADS2VASC =  5   ( female, age 79, CVA No chief complaint on file.   Aug. 7, 2020     Mercado Mercado is a 79 y.o. female with a hx of rapid atrial fib.    We cardioverted her on July 7. Is doing great.   Is having some dizziness.  Constant.  Not related to orthostatic.  Is not dizzy when she is lying down      Thinks its due to the metoprolol   Nov. 9, 2020  Mercado Mercado is seen today for follow up of her atrial fib Doing well No cp Breathing is ok Has some left lower rib soreness.  Has lots of dizziness.   Especially after starting the telmisartan .    Reviewed her BP log  No readings below 100 .   Most blood pressure readings since starting the telmisartan are in the normal range.  Her blood pressure was mildly elevated before starting telmisartan.  August 09, 2019: Mercado Mercado is seen today for follow-up of her atrial fibrillation.  She had a successful cardioversion July, 2020.  She remains in normal sinus rhythm. Dizziness has improved.   Having back pain - is on ultram   Has the sensation of quivering inside - she does not think its a HR irregularity   Past Medical History:  Diagnosis Date  . Allergy   . CVA (cerebral vascular accident) ([place])   . Hyperlipidemia LDL goal <70   . Hypertension   . Palpitations   . Vertigo, benign positional     Past Surgical History:  Procedure Laterality Date  . CARDIOVERSION Left 10/31/2018   Procedure: CARDIOVERSION;  Surgeon: Acie Fredrickson Wonda Cheng, MD;  Location: [facility] ENDOSCOPY;  Service: Cardiovascular;  Laterality: Left;  . Hemilaminectomy and microdiskectomy at L4-5 on the left.  12/10/2003  . TEE WITHOUT CARDIOVERSION N/A 06/05/2018   Procedure:  TRANSESOPHAGEAL ECHOCARDIOGRAM (TEE);  Surgeon: Lelon Perla, MD;  Location: [facility] ENDOSCOPY;  Service: Cardiovascular;  Laterality: N/A;  loop    Current Medications: Current Meds  Medication Sig  . atorvastatin (LIPITOR) 40 MG tablet TAKE 1 TABLET BY MOUTH EVERY DAY AT 6 PM  . diltiazem (CARDIZEM CD) 180 MG 24 hr capsule TAKE 1 CAPSULE BY MOUTH EVERY DAY  . ELIQUIS 5 MG TABS tablet TAKE 1 TABLET BY MOUTH TWICE A DAY  . telmisartan (MICARDIS) 20 MG tablet Take 0.5 tablets (10 mg total) by mouth daily.  . traMADol (ULTRAM) 50 MG tablet Take 1 tablet (50 mg total) by mouth every 6 (six) hours as needed for moderate pain.     Allergies:   Duloxetine and Acetaminophen   Social History   Socioeconomic History  . Marital status: Married    Spouse name: Not on file  . Number of children: Not on file  . Years of education: Not on file  . Highest education level: Not on file  Occupational History  . Not on file  Tobacco Use  . Smoking status: Never Smoker  . Smokeless tobacco: Never Used  Substance and Sexual Activity  . Alcohol use: No  . Drug use: No  . Sexual activity: Not on file  Other Topics Concern  . Not on file  Social History Narrative  . Not on file   Social Determinants of Health   Financial Resource Strain:   . Difficulty of Paying Living Expenses:   Food Insecurity:   . Worried About Charity fundraiser in the Last Year:   . Arboriculturist in the Last Year:   Transportation Needs:   . Film/video editor (Medical):   Marland Kitchen Lack of Transportation (Non-Medical):   Physical Activity:   . Days of Exercise per Week:   . Minutes of Exercise per Session:   Stress:   . Feeling of Stress :   Social Connections:   . Frequency of Communication with Friends and Family:   . Frequency of Social Gatherings with Friends and Family:   . Attends Religious Services:   . Active Member of Clubs or Organizations:   . Attends Archivist Meetings:   Marland Kitchen Marital  Status:      Family History: The patient's family history includes Colon cancer in her father; Diabetes in her mother; Heart attack in her mother; Heart attack (age of onset: 34) in her brother.  ROS:   Please see the history of present illness.     All other systems reviewed and are negative.  EKGs/Labs/Other Studies Reviewed:    The following studies were reviewed today:   EKG:    Recent Labs: 10/25/2018: Hemoglobin 15.2; Platelets 294 02/22/2019: BUN 11; Creatinine, Ser 0.68; Potassium 4.2; Sodium 139  Recent Lipid Panel    Component Value Date/Time   CHOL 230 (H) 12/24/2018 1239   TRIG 73.0 12/24/2018 1239   HDL 109.00 12/24/2018 1239   CHOLHDL 2 12/24/2018 1239   VLDL 14.6 12/24/2018 1239   LDLCALC 107 (H) 12/24/2018 1239     Physical Exam: Blood pressure 122/74, pulse 68, height 5\' 5"  (1.651 m), weight 143 lb 8 oz (65.1 kg), SpO2 99 %.  GEN:   Elderly female  HEENT: Normal NECK: No JVD; No carotid bruits LYMPHATICS: No lymphadenopathy CARDIAC: RRR   RESPIRATORY:  Clear to auscultation without rales, wheezing or rhonchi  ABDOMEN: Soft, non-tender, non-distended MUSCULOSKELETAL:  No edema; No deformity  SKIN: Warm and dry NEUROLOGIC:  Grossly intact.  Some memory issues     ASSESSMENT:    1. SVT (supraventricular tachycardia) (HCC)   2. Persistent atrial fibrillation ([place])    PLAN:      1.  Atrial fibrillation:   Remains in NSR .   Cont current meds.   2.  Dizziness:   Seems to have improved.   3. Quivering in her chest :   She thinks she is anxious.   Have asked her to let me know if these worsen.   We can consider placing a monitor     Medication Adjustments/Labs and Tests Ordered: Current medicines are reviewed at length with the patient today.  Concerns regarding medicines are outlined above.  No orders of the defined types were placed in this encounter.  No orders of the defined types were placed in this encounter.   Patient Instructions    Medication Instructions:  Your physician recommends that you continue on your current medications as directed. Please refer to the Current Medication list given to you today.  *If you need a refill on your cardiac medications before your next appointment, please call your pharmacy*   Lab Work: None ordered If you have labs (blood work) drawn today and your tests are completely normal,  you will receive your results only by: Marland Kitchen MyChart Message (if you have MyChart) OR . A paper copy in the mail If you have any lab test that is abnormal or we need to change your treatment, we will call you to review the results.   Testing/Procedures: None ordered   Follow-Up: At [facility], you and your health needs are our priority.  As part of our continuing mission to provide you with exceptional heart care, we have created designated Provider Care Teams.  These Care Teams include your primary Cardiologist (physician) and Advanced Practice Providers (APPs -  Physician Assistants and Nurse Practitioners) who all work together to provide you with the care you need, when you need it.  We recommend signing up for the patient portal called "MyChart".  Sign up information is provided on this After Visit Summary.  MyChart is used to connect with patients for Virtual Visits (Telemedicine).  Patients are able to view lab/test results, encounter notes, upcoming appointments, etc.  Non-urgent messages can be sent to your provider as well.   To learn more about what you can do with MyChart, go to [email or web address].    Your next appointment:   6 month(s)  The format for your next appointment:   In Person  Provider:   You may see  one of the following Advanced Practice Providers on your designated Care Team:    Richardson Dopp, PA-C  Robbie Lis, [place]  Your physician wants you to follow-up in: 1 year with Dr . Acie Fredrickson. You will receive a reminder letter in the mail two months in advance. If you don't  receive a letter, please call our office to schedule the follow-up appointment.   Thank you for choosing [facility]!!     Other Instructions       Signed, Mercado Moores, MD  08/09/2019 10:43 AM    [place]

## 2019-08-09 NOTE — Patient Instructions (Signed)
Medication Instructions:  Your physician recommends that you continue on your current medications as directed. Please refer to the Current Medication list given to you today.  *If you need a refill on your cardiac medications before your next appointment, please call your pharmacy*   Lab Work: None ordered If you have labs (blood work) drawn today and your tests are completely normal, you will receive your results only by: Marland Kitchen MyChart Message (if you have MyChart) OR . A paper copy in the mail If you have any lab test that is abnormal or we need to change your treatment, we will call you to review the results.   Testing/Procedures: None ordered   Follow-Up: At [facility], you and your health needs are our priority.  As part of our continuing mission to provide you with exceptional heart care, we have created designated Provider Care Teams.  These Care Teams include your primary Cardiologist (physician) and Advanced Practice Providers (APPs -  Physician Assistants and Nurse Practitioners) who all work together to provide you with the care you need, when you need it.  We recommend signing up for the patient portal called "MyChart".  Sign up information is provided on this After Visit Summary.  MyChart is used to connect with patients for Virtual Visits (Telemedicine).  Patients are able to view lab/test results, encounter notes, upcoming appointments, etc.  Non-urgent messages can be sent to your provider as well.   To learn more about what you can do with MyChart, go to [email or web address].    Your next appointment:   6 month(s)  The format for your next appointment:   In Person  Provider:   You may see  one of the following Advanced Practice Providers on your designated Care Team:    Richardson Dopp, PA-C  Robbie Lis, [place]  Your physician wants you to follow-up in: 1 year with Dr . Acie Fredrickson. You will receive a reminder letter in the mail two months in advance. If you don't  receive a letter, please call our office to schedule the follow-up appointment.   Thank you for choosing [facility]!!     Other Instructions

## 2019-08-13 ENCOUNTER — Ambulatory Visit: Payer: Medicare Other

## 2019-08-13 ENCOUNTER — Other Ambulatory Visit: Payer: Self-pay

## 2019-08-13 VITALS — BP 132/77 | HR 64

## 2019-08-13 DIAGNOSIS — R2681 Unsteadiness on feet: Secondary | ICD-10-CM | POA: Diagnosis not present

## 2019-08-13 DIAGNOSIS — R278 Other lack of coordination: Secondary | ICD-10-CM

## 2019-08-13 DIAGNOSIS — R208 Other disturbances of skin sensation: Secondary | ICD-10-CM

## 2019-08-13 DIAGNOSIS — R2689 Other abnormalities of gait and mobility: Secondary | ICD-10-CM

## 2019-08-13 DIAGNOSIS — M6281 Muscle weakness (generalized): Secondary | ICD-10-CM | POA: Diagnosis not present

## 2019-08-13 DIAGNOSIS — R209 Unspecified disturbances of skin sensation: Secondary | ICD-10-CM | POA: Diagnosis not present

## 2019-08-13 NOTE — Patient Instructions (Signed)
Access Code: [email or web address] URL: [email or web address] Date: 08/13/2019 Prepared by: Baldomero Lamy  Exercises Romberg Stance with Eyes Closed - 1 x daily - 5 x weekly - 2 sets - 10 reps Romberg Stance with Head Rotation - 1 x daily - 5 x weekly - 2 sets - 10 reps Sit to Stand with Arms Crossed - 1 x daily - 5 x weekly - 2 sets - 10 reps Clamshell - 1 x daily - 5 x weekly - 2 sets - 10 reps Supine Bridge - 1 x daily - 5 x weekly - 2 sets - 10 reps

## 2019-08-13 NOTE — Therapy (Addendum)
[facility] [address] [place], [place], [address] Phone: [phone]   Fax:  [phone]  Physical Therapy Treatment  Patient Details  Name: Mary Mercado MRN: [id number] Date of Birth: 03-25-1941 Referring Provider (PT): Frann Rider, NP   Encounter Date: 08/13/2019  PT End of Session - 08/13/19 1537    Visit Number  2    Number of Visits  17    Date for PT Re-Evaluation  11/01/19   written for 60 day POC   Authorization Type  Medicare, BCBS (will need 10th visit progress note)    PT Start Time  1530    PT Stop Time  1616    PT Time Calculation (min)  46 min    Equipment Utilized During Treatment  Gait belt    Activity Tolerance  Patient tolerated treatment well    Behavior During Therapy  [facility] for tasks assessed/performed       Past Medical History:  Diagnosis Date  . Allergy   . CVA (cerebral vascular accident) ([place])   . Hyperlipidemia LDL goal <70   . Hypertension   . Palpitations   . Vertigo, benign positional     Past Surgical History:  Procedure Laterality Date  . CARDIOVERSION Left 10/31/2018   Procedure: CARDIOVERSION;  Surgeon: Acie Fredrickson Wonda Cheng, MD;  Location: [facility] ENDOSCOPY;  Service: Cardiovascular;  Laterality: Left;  . Hemilaminectomy and microdiskectomy at L4-5 on the left.  12/10/2003  . TEE WITHOUT CARDIOVERSION N/A 06/05/2018   Procedure: TRANSESOPHAGEAL ECHOCARDIOGRAM (TEE);  Surgeon: Lelon Perla, MD;  Location: [facility] ENDOSCOPY;  Service: Cardiovascular;  Laterality: N/A;  loop    Vitals:   08/13/19 1643  BP: 132/77  Pulse: 64    Subjective Assessment - 08/13/19 1533    Subjective  Patient reports that she had an episode of back pain on tuesday that caused her to miss her last two appointments. Reports it is feeling better. No Falls. No reports of dizziness today.    Patient is accompained by:  Family member   husband, [name]   Pertinent History  PMH: CVA (2020), HTN,  HLD    Limitations  Standing;Walking    How long can you stand comfortably?  5 minutes    How long can you walk comfortably?  10-15 minutes    Patient Stated Goals  wants to feel good - wants to be able to walk and not feel crazy    Currently in Pain?  No/denies         [facility] PT Assessment - 08/13/19 1632      6 Minute walk- Post Test   6 Minute Walk Post Test  yes    BP (mmHg)  132/77    HR (bpm)  64    Perceived Rate of Exertion ([place])  13- Somewhat hard      6 minute walk test results    Aerobic Endurance Distance Walked  935    Endurance additional comments  completed w/o AD, supv for safety                   [facility] Adult PT Treatment/Exercise - 08/13/19 0001      Transfers   Transfers  Sit to Stand;Stand to Sit    Sit to Stand  Without upper extremity assist;From chair/3-in-1    Stand to Sit  5: Supervision;Without upper extremity assist;To chair/3-in-1    Comments  completed sit <> stand x 10 reps without UE support for improved  strengthening of BLE. Increasing fatigue noted by end of set.       Ambulation/Gait   Ambulation/Gait  Yes    Ambulation/Gait Assistance  5: Supervision    Ambulation/Gait Assistance Details  supv for safety    Ambulation Distance (Feet)  935 Feet   amount completed with 6MWT   Assistive device  None    Gait Pattern  Step-through pattern;Decreased arm swing - left;Decreased step length - left;Decreased stance time - left;Decreased dorsiflexion - left;Decreased weight shift to left;Decreased trunk rotation;Decreased stride length    Ambulation Surface  Level;Indoor    Gait Comments  during completion of 6MWT, patient demonstrating increased unsteadiness with turns and narrow BOS.      Neuro Re-ed    Neuro Re-ed Details   Further Assessed balance with M-CTSIB, with patient completing Situation 1 - holding for 30 seconds however demonstrating increased sway. Situation 2 - only able to hold for an average of 14 seconds, with patient  reporting extreme unsteadiness and wanting to sit down. Intermittent CGA as needed. Situation 3 and 4 not attempted at this time. Completed feet together with horizontal head turns, pt. demo slowness of movement with decreased range. Verbal cues for improved range. Educated to completed balance exercises at home in corner of house for improved safety.       Exercises   Exercises  Knee/Hip      Knee/Hip Exercises: Supine   Bridges  Strengthening;Both;1 set;10 reps    Bridges Limitations  verbal cues for form.       Knee/Hip Exercises: Sidelying   Clams  completed sidelying clamshell w/o resistance, 1 x 10 reps. Cue for proper completion to ensure motion is coming from hip and keep feet together during completion.              PT Education - 08/13/19 1643    Education Details  Patient educated on initial HEP (see patient instructions)    Person(s) Educated  Patient    Methods  Explanation;Demonstration;Handout    Comprehension  Verbalized understanding       PT Short Term Goals - 08/03/19 0940      PT SHORT TERM GOAL #1   Title  Pt will be independent with HEP for improved strength, balance, gait.  ALL STGS DUE 08/31/19    Time  4    Period  Weeks    Status  New    Target Date  08/31/19      PT SHORT TERM GOAL #2   Title  Pt will improve DGI score to at least 12/24 in order to demo decr fall risk.    Baseline  9/24    Time  4    Period  Weeks    Status  New      PT SHORT TERM GOAL #3   Title  Pt will improve gait speed to at least 2.9 ft/sec to demo improved gait efficiency and decr fall risk.    Baseline  2.59 ft/sec    Time  4    Period  Weeks    Status  New      PT SHORT TERM GOAL #4   Title  Pt will decr 5x sit <> stand to 17 seconds or less without UE support from standard chair to demo improved functional LE strength.    Time  4    Period  Weeks    Status  New      PT SHORT TERM GOAL #5   Title  Pt will verbalize understanding of fall prevention in home  environment.    Time  4    Period  Weeks    Status  New        PT Long Term Goals - 08/13/19 1648      PT LONG TERM GOAL #1   Title  Pt will verbalize plans for return to community fitness upon d/c from PT and be independent with final HEP.  ALL LTGS DUE 09/28/19    Time  8    Period  Weeks    Status  New      PT LONG TERM GOAL #2   Title  Pt will improve gait velocity to at least 3.2 ft/sec for improved gait efficiency    Baseline  2.59 ft/sec    Time  8    Period  Weeks    Status  New      PT LONG TERM GOAL #3   Title  Pt will improve DGI score to at least 16/24 in order to demo decr fall risk.    Time  8    Period  Weeks    Status  New      PT LONG TERM GOAL #4   Title  Pt will ambulate at least 1000 ft, indoors and outdoors, independently, no LOB for improved gait safety and efficiency in community.    Time  8    Period  Weeks    Status  New      PT LONG TERM GOAL #5   Title  Pt will decr 5x sit <> stand to 15 seconds or less without UE support from standard chair to demo improved functional LE strength.    Time  8    Period  Weeks    Status  New      Additional Long Term Goals   Additional Long Term Goals  Yes      PT LONG TERM GOAL #6   Title  Patient will be able to ambulate >1200 ft with 6MWT w/o AD and supv to demonstrate improved endurance    Baseline  935    Time  8    Period  Weeks    Status  New            Plan - 08/15/19 1849    Clinical Impression Statement  Addendum: PT skilled session focused on further assessment with completion of 6MWT test with patient completing 935 without AD and supv. Vital response was normal with testing. Updated LTG to include goal focused on improved 6MWT. Also initiated HEP in todays session, focused on lower extremity strengthening and balance for further improvements. Pt will continue to benefit from skilled PT services to improve balance, stregnth, and functional mobility.    Personal Factors and Comorbidities   Comorbidity 3+;Time since onset of injury/illness/exacerbation   CVA 05/31/2018; independent and active prior to CVA   Comorbidities  HTN, HLD, SVT, Essential HTN, vertigo (BPPV), hemilaminectomy/miscrodiscectomy 2005    Examination-Activity Limitations  Locomotion Level;Stairs;Stand;Transfers    Examination-Participation Restrictions  Church;Community Activity    Stability/Clinical Decision Making  Stable/Uncomplicated    Rehab Potential  Good    PT Frequency  2x / week    PT Duration  8 weeks    PT Treatment/Interventions  ADLs/Self Care Home Management;DME Instruction;Gait training;Stair training;Functional mobility training;Therapeutic activities;Therapeutic exercise;Balance training;Neuromuscular re-education;Patient/family education;Vestibular    PT Next Visit Plan  vestibular assessment? review HEP. balance and strengthening    Consulted and Agree with  Plan of Care  Patient;Family member/caregiver    Family Member Consulted  Husband          Patient will benefit from skilled therapeutic intervention in order to improve the following deficits and impairments:     Visit Diagnosis: Muscle weakness (generalized)  Unsteadiness on feet  Other disturbances of skin sensation  Other abnormalities of gait and mobility  Other lack of coordination     Problem List Patient Active Problem List   Diagnosis Date Noted  . Dizziness 11/30/2018  . Atrial fibrillation ([place]) 08/13/2018  . Prediabetes 06/15/2018  . Palpitations 06/05/2018  . Essential hypertension 06/05/2018  . Hyperlipidemia LDL goal <70 06/05/2018  . 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 SVT (supraventricular tachycardia) ([place]) 05/28/2015  . PVC's (premature ventricular contractions) 05/28/2015  . BENIGN POSITIONAL VERTIGO 06/16/2009  . ALLERGIC RHINITIS 06/16/2009    Jones Bales, PT, DPT 08/13/2019, 4:51 PM  [facility] [address] [place], [place], [address] Phone: [phone]   Fax:  [phone]  Name: Mary Mercado MRN: [id number] Date of Birth: [birth year]

## 2019-08-15 ENCOUNTER — Ambulatory Visit: Payer: Medicare Other

## 2019-08-15 ENCOUNTER — Other Ambulatory Visit: Payer: Self-pay

## 2019-08-15 DIAGNOSIS — R278 Other lack of coordination: Secondary | ICD-10-CM

## 2019-08-15 DIAGNOSIS — R209 Unspecified disturbances of skin sensation: Secondary | ICD-10-CM | POA: Diagnosis not present

## 2019-08-15 DIAGNOSIS — R2689 Other abnormalities of gait and mobility: Secondary | ICD-10-CM

## 2019-08-15 DIAGNOSIS — R2681 Unsteadiness on feet: Secondary | ICD-10-CM

## 2019-08-15 DIAGNOSIS — M6281 Muscle weakness (generalized): Secondary | ICD-10-CM | POA: Diagnosis not present

## 2019-08-15 DIAGNOSIS — R208 Other disturbances of skin sensation: Secondary | ICD-10-CM

## 2019-08-15 NOTE — Therapy (Signed)
[facility] [address] [place], [place], [address] Phone: [phone]   Fax:  [phone]  Physical Therapy Treatment  Patient Details  Name: Mary Mercado MRN: [id number] Date of Birth: 1940-11-23 Referring Provider (PT): Frann Rider, NP   Encounter Date: 08/15/2019  PT End of Session - 08/15/19 1845    Visit Number  3    Number of Visits  17    Date for PT Re-Evaluation  11/01/19   written for 60 day POC   Authorization Type  Medicare, BCBS (will need 10th visit progress note)    PT Start Time  1534    PT Stop Time  1615    PT Time Calculation (min)  41 min    Equipment Utilized During Treatment  Gait belt    Activity Tolerance  Patient tolerated treatment well    Behavior During Therapy  [facility] for tasks assessed/performed       Past Medical History:  Diagnosis Date  . Allergy   . CVA (cerebral vascular accident) ([place])   . Hyperlipidemia LDL goal <70   . Hypertension   . Palpitations   . Vertigo, benign positional     Past Surgical History:  Procedure Laterality Date  . CARDIOVERSION Left 10/31/2018   Procedure: CARDIOVERSION;  Surgeon: Acie Fredrickson Wonda Cheng, MD;  Location: [facility] ENDOSCOPY;  Service: Cardiovascular;  Laterality: Left;  . Hemilaminectomy and microdiskectomy at L4-5 on the left.  12/10/2003  . TEE WITHOUT CARDIOVERSION N/A 06/05/2018   Procedure: TRANSESOPHAGEAL ECHOCARDIOGRAM (TEE);  Surgeon: Lelon Perla, MD;  Location: [facility] ENDOSCOPY;  Service: Cardiovascular;  Laterality: N/A;  loop    There were no vitals filed for this visit.  Subjective Assessment - 08/15/19 1538    Subjective  Patient reports that she was able to complete all of the exercises in her HEP at home with no difficulty. Patient reports that the back pain is improving. No falls. Reports no true dizziness, but feels "funny headed" alot.    Patient is accompained by:  Family member   husband, [name]   Pertinent  History  PMH: CVA (2020), HTN, HLD    Limitations  Standing;Walking    How long can you stand comfortably?  5 minutes    How long can you walk comfortably?  10-15 minutes    Patient Stated Goals  wants to feel good - wants to be able to walk and not feel crazy    Currently in Pain?  No/denies             Vestibular Assessment - 08/15/19 1543      Symptom Behavior   Subjective history of current problem  Patient reporting feeling "funny headed" when standing and certain head movements. Pt does have a prior history of BPPV    Type of Dizziness   "Funny feeling in head";Unsteady with head/body turns    Frequency of Dizziness  Intermittent    Duration of Dizziness  30sec - 61min    Symptom Nature  Motion provoked    Aggravating Factors  Turning head quickly;Turning body quickly    Relieving Factors  Comments   sitting down     Oculomotor Exam   Oculomotor Alignment  Normal    Spontaneous  Absent    Smooth Pursuits  Saccades   one instance of increased dizziness with testing   Saccades  Undershoots   able to correct.      Visual Acuity   Static  11  Dynamic  8   increased dizziness with head movements     Positional Sensitivities   Sit to Supine  No dizziness    Supine to Left Side  No dizziness    Supine to Right Side  No dizziness    Supine to Sitting  No dizziness    Nose to Right Knee  No dizziness    Right Knee to Sitting  Mild dizziness    Nose to Left Knee  No dizziness    Left Knee to Sitting  Mild dizziness    Head Turning x 5  Moderate dizziness   symptoms increases w/ increased speed of movement   Head Nodding x 5  Mild dizziness      Orthostatics   BP sitting  127/78    HR sitting  60    BP standing (after 1 minute)  133/76    HR standing (after 1 minute)  63               [facility] Adult PT Treatment/Exercise - 08/15/19 1841      Ambulation/Gait   Ambulation/Gait  Yes    Ambulation/Gait Assistance  5: Supervision    Ambulation Distance  (Feet)  115 Feet    Assistive device  None    Gait Pattern  Step-through pattern;Decreased arm swing - left;Decreased step length - left;Decreased stance time - left;Decreased dorsiflexion - left;Decreased weight shift to left;Decreased trunk rotation;Decreased stride length    Ambulation Surface  Level;Indoor      Neuro Re-ed    Neuro Re-ed Details   Completed standing balance on firm surface with eyes closed 2 x 30 seconds, and horizontal head turns 2 x 10. Increased dizziness noted with increasing repetition of horizontal head turns. PT providing vebral cues for improved ROM with head turns.                PT Short Term Goals - 08/03/19 0940      PT SHORT TERM GOAL #1   Title  Pt will be independent with HEP for improved strength, balance, gait.  ALL STGS DUE 08/31/19    Time  4    Period  Weeks    Status  New    Target Date  08/31/19      PT SHORT TERM GOAL #2   Title  Pt will improve DGI score to at least 12/24 in order to demo decr fall risk.    Baseline  9/24    Time  4    Period  Weeks    Status  New      PT SHORT TERM GOAL #3   Title  Pt will improve gait speed to at least 2.9 ft/sec to demo improved gait efficiency and decr fall risk.    Baseline  2.59 ft/sec    Time  4    Period  Weeks    Status  New      PT SHORT TERM GOAL #4   Title  Pt will decr 5x sit <> stand to 17 seconds or less without UE support from standard chair to demo improved functional LE strength.    Time  4    Period  Weeks    Status  New      PT SHORT TERM GOAL #5   Title  Pt will verbalize understanding of fall prevention in home environment.    Time  4    Period  Weeks    Status  New  PT Long Term Goals - 08/13/19 1648      PT LONG TERM GOAL #1   Title  Pt will verbalize plans for return to community fitness upon d/c from PT and be independent with final HEP.  ALL LTGS DUE 09/28/19    Time  8    Period  Weeks    Status  New      PT LONG TERM GOAL #2   Title  Pt will  improve gait velocity to at least 3.2 ft/sec for improved gait efficiency    Baseline  2.59 ft/sec    Time  8    Period  Weeks    Status  New      PT LONG TERM GOAL #3   Title  Pt will improve DGI score to at least 16/24 in order to demo decr fall risk.    Time  8    Period  Weeks    Status  New      PT LONG TERM GOAL #4   Title  Pt will ambulate at least 1000 ft, indoors and outdoors, independently, no LOB for improved gait safety and efficiency in community.    Time  8    Period  Weeks    Status  New      PT LONG TERM GOAL #5   Title  Pt will decr 5x sit <> stand to 15 seconds or less without UE support from standard chair to demo improved functional LE strength.    Time  8    Period  Weeks    Status  New      Additional Long Term Goals   Additional Long Term Goals  Yes      PT LONG TERM GOAL #6   Title  Patient will be able to ambulate >1200 ft with 6MWT w/o AD and supv to demonstrate improved endurance    Baseline  935    Time  8    Period  Weeks    Status  New            Plan - 08/15/19 1856    Clinical Impression Statement  Today's skilled PT session focused on vestibular assessment due to patient reports and signs/symptoms of dizziness with certain activities during prior therapy sessions. Patient demonstrate saccadic smooth pursuits, undershoots with saccades, abnormal DVA (3 line difference) and increased symptoms with horizontal head turn/body movements. Patient has prior history of BPPV, however reports this is not the same feeling as vertigo. Will complete Sensory Oganization Test at next session for further assessment.    Personal Factors and Comorbidities  Comorbidity 3+;Time since onset of injury/illness/exacerbation   CVA 05/31/2018; independent and active prior to CVA   Comorbidities  HTN, HLD, SVT, Essential HTN, vertigo (BPPV), hemilaminectomy/miscrodiscectomy 2005    Examination-Activity Limitations  Locomotion Level;Stairs;Stand;Transfers     Examination-Participation Restrictions  Church;Community Activity    Stability/Clinical Decision Making  Stable/Uncomplicated    Rehab Potential  Good    PT Frequency  2x / week    PT Duration  8 weeks    PT Treatment/Interventions  ADLs/Self Care Home Management;DME Instruction;Gait training;Stair training;Functional mobility training;Therapeutic activities;Therapeutic exercise;Balance training;Neuromuscular re-education;Patient/family education;Vestibular    PT Next Visit Plan  Complete SOT? balance and strengthening.    Consulted and Agree with Plan of Care  Patient;Family member/caregiver    Family Member Consulted  Husband       Patient will benefit from skilled therapeutic intervention in order to improve the following deficits  and impairments:  Abnormal gait, Decreased balance, Decreased mobility, Difficulty walking, Decreased strength, Postural dysfunction, Dizziness, Impaired sensation  Visit Diagnosis: Unsteadiness on feet  Other disturbances of skin sensation  Other abnormalities of gait and mobility  Other lack of coordination  Muscle weakness (generalized)     Problem List Patient Active Problem List   Diagnosis Date Noted  . Dizziness 11/30/2018  . Atrial fibrillation ([place]) 08/13/2018  . Prediabetes 06/15/2018  . Palpitations 06/05/2018  . Essential hypertension 06/05/2018  . Hyperlipidemia LDL goal <70 06/05/2018  . 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 SVT (supraventricular tachycardia) ([place]) 05/28/2015  . PVC's (premature ventricular contractions) 05/28/2015  . BENIGN POSITIONAL VERTIGO 06/16/2009  . ALLERGIC RHINITIS 06/16/2009    Jones Bales, PT, DPT 08/15/2019, 7:01 PM  [facility] [address] [place], [place], [address] Phone: [phone]   Fax:  [phone]  Name: Mary Mercado MRN: [id number] Date of Birth: 1941-02-17

## 2019-08-20 ENCOUNTER — Ambulatory Visit: Payer: Medicare Other

## 2019-08-20 ENCOUNTER — Other Ambulatory Visit: Payer: Self-pay

## 2019-08-20 DIAGNOSIS — R2689 Other abnormalities of gait and mobility: Secondary | ICD-10-CM | POA: Diagnosis not present

## 2019-08-20 DIAGNOSIS — R278 Other lack of coordination: Secondary | ICD-10-CM | POA: Diagnosis not present

## 2019-08-20 DIAGNOSIS — M6281 Muscle weakness (generalized): Secondary | ICD-10-CM

## 2019-08-20 DIAGNOSIS — R2681 Unsteadiness on feet: Secondary | ICD-10-CM | POA: Diagnosis not present

## 2019-08-20 DIAGNOSIS — R209 Unspecified disturbances of skin sensation: Secondary | ICD-10-CM | POA: Diagnosis not present

## 2019-08-20 NOTE — Therapy (Signed)
[facility] [address] [place], [place], [address] Phone: [phone]   Fax:  [phone]  Physical Therapy Treatment  Patient Details  Name: Mary Mercado MRN: [id number] Date of Birth: [birth year] Referring Provider (PT): Frann Rider, NP   Encounter Date: 08/20/2019  PT End of Session - 08/20/19 1536    Visit Number  4    Number of Visits  17    Date for PT Re-Evaluation  11/01/19   written for 60 day POC   Authorization Type  Medicare, BCBS (will need 10th visit progress note)    PT Start Time  1531    PT Stop Time  1614    PT Time Calculation (min)  43 min    Equipment Utilized During Treatment  Gait belt    Activity Tolerance  Patient tolerated treatment well    Behavior During Therapy  [facility] for tasks assessed/performed       Past Medical History:  Diagnosis Date  . Allergy   . CVA (cerebral vascular accident) ([place])   . Hyperlipidemia LDL goal <70   . Hypertension   . Palpitations   . Vertigo, benign positional     Past Surgical History:  Procedure Laterality Date  . CARDIOVERSION Left 10/31/2018   Procedure: CARDIOVERSION;  Surgeon: Acie Fredrickson Wonda Cheng, MD;  Location: [facility] ENDOSCOPY;  Service: Cardiovascular;  Laterality: Left;  . Hemilaminectomy and microdiskectomy at L4-5 on the left.  12/10/2003  . TEE WITHOUT CARDIOVERSION N/A 06/05/2018   Procedure: TRANSESOPHAGEAL ECHOCARDIOGRAM (TEE);  Surgeon: Lelon Perla, MD;  Location: [facility] ENDOSCOPY;  Service: Cardiovascular;  Laterality: N/A;  loop    There were no vitals filed for this visit.  Subjective Assessment - 08/20/19 1533    Subjective  Patient reports doing well since last visit, has had some episodes of "funny headed" feeling. Reports she has been doing her HEP. No falls since last visit. Reports that the back is getting better with each day.    Patient is accompained by:  Family member   husband, [name]   Pertinent History  PMH:  CVA (2020), HTN, HLD    Limitations  Standing;Walking    How long can you stand comfortably?  5 minutes    How long can you walk comfortably?  10-15 minutes    Patient Stated Goals  wants to feel good - wants to be able to walk and not feel crazy    Currently in Pain?  No/denies         [facility] PT Assessment - 08/20/19 1757      Standardized Balance Assessment   Standardized Balance Assessment  Balance Master Testing    Balance Master Testing  Sensory Organization Test      Balance Master Testing    Results  With completion of SOT, patient able to score above average in comparison to age related norms with conditions 1-4. Patient demonstrating difficulty with conditions 5 and 6. Two trials marked as a fall with condition 5, due to increased unsteadiness and requiring assistance from PT to stay upright. With situation 6, patient had one trial marked as a fall due to unsteadiness as well. However able to complete trial 2 and 3 much better. During testing, patient required a seating rest break after conditions 1-3 due to reports of unsteadiness and "funny headed" feeling. Short rest break of 1-2 minutes required and then resumed testing. With the completion of today's assessment, the patients somatosensory and visual system are above average,  however demonstrate that her vestibular system is functioning below average at this time and will benefit from balance activities challenging this specific system.                   [facility] Adult PT Treatment/Exercise - 08/20/19 1801      Neuro Re-ed    Neuro Re-ed Details   Completed standing balance on firm surface, including tandem stance 2 x 30 secs each leg. Also completed horizontal head turns with feet together, 2 x 10 reps each. Increased dizziness with horizontal head turns. Require a short rest break to resolve symptoms between sets. Supv for safety and CGA for steadying as needed.              PT Education - 08/20/19 1804     Education Details  Patient educated on findings of balance testing with completion of Sensory Organization Testing (SOT).    Person(s) Educated  Patient    Methods  Explanation;Handout    Comprehension  Verbalized understanding       PT Short Term Goals - 08/03/19 0940      PT SHORT TERM GOAL #1   Title  Pt will be independent with HEP for improved strength, balance, gait.  ALL STGS DUE 08/31/19    Time  4    Period  Weeks    Status  New    Target Date  08/31/19      PT SHORT TERM GOAL #2   Title  Pt will improve DGI score to at least 12/24 in order to demo decr fall risk.    Baseline  9/24    Time  4    Period  Weeks    Status  New      PT SHORT TERM GOAL #3   Title  Pt will improve gait speed to at least 2.9 ft/sec to demo improved gait efficiency and decr fall risk.    Baseline  2.59 ft/sec    Time  4    Period  Weeks    Status  New      PT SHORT TERM GOAL #4   Title  Pt will decr 5x sit <> stand to 17 seconds or less without UE support from standard chair to demo improved functional LE strength.    Time  4    Period  Weeks    Status  New      PT SHORT TERM GOAL #5   Title  Pt will verbalize understanding of fall prevention in home environment.    Time  4    Period  Weeks    Status  New        PT Long Term Goals - 08/13/19 1648      PT LONG TERM GOAL #1   Title  Pt will verbalize plans for return to community fitness upon d/c from PT and be independent with final HEP.  ALL LTGS DUE 09/28/19    Time  8    Period  Weeks    Status  New      PT LONG TERM GOAL #2   Title  Pt will improve gait velocity to at least 3.2 ft/sec for improved gait efficiency    Baseline  2.59 ft/sec    Time  8    Period  Weeks    Status  New      PT LONG TERM GOAL #3   Title  Pt will improve DGI score to at least 16/24 in  order to demo decr fall risk.    Time  8    Period  Weeks    Status  New      PT LONG TERM GOAL #4   Title  Pt will ambulate at least 1000 ft, indoors and  outdoors, independently, no LOB for improved gait safety and efficiency in community.    Time  8    Period  Weeks    Status  New      PT LONG TERM GOAL #5   Title  Pt will decr 5x sit <> stand to 15 seconds or less without UE support from standard chair to demo improved functional LE strength.    Time  8    Period  Weeks    Status  New      Additional Long Term Goals   Additional Long Term Goals  Yes      PT LONG TERM GOAL #6   Title  Patient will be able to ambulate >1200 ft with 6MWT w/o AD and supv to demonstrate improved endurance    Baseline  935    Time  8    Period  Weeks    Status  New            Plan - 08/20/19 1805    Clinical Impression Statement  Due to conditioned patient reports of "funny headed" feeling and increasing dizziness with balance exercises targeting the vestibular system completed further assessment today with the sensory organization test. Patient's somatosensory and visual system are above average, however vestibular system functioning below average for age related norms. This demonstrates patient will benefit from skilled PT services to address symptoms,improve balance, and overall functional mobility.    Personal Factors and Comorbidities  Comorbidity 3+;Time since onset of injury/illness/exacerbation   CVA 05/31/2018; independent and active prior to CVA   Comorbidities  HTN, HLD, SVT, Essential HTN, vertigo (BPPV), hemilaminectomy/miscrodiscectomy 2005    Examination-Activity Limitations  Locomotion Level;Stairs;Stand;Transfers    Examination-Participation Restrictions  Church;Community Activity    Stability/Clinical Decision Making  Stable/Uncomplicated    Rehab Potential  Good    PT Frequency  2x / week    PT Duration  8 weeks    PT Treatment/Interventions  ADLs/Self Care Home Management;DME Instruction;Gait training;Stair training;Functional mobility training;Therapeutic activities;Therapeutic exercise;Balance training;Neuromuscular  re-education;Patient/family education;Vestibular    PT Next Visit Plan  Continue balance (with focus on vestibular component)    Consulted and Agree with Plan of Care  Patient;Family member/caregiver    Family Member Consulted  Husband       Patient will benefit from skilled therapeutic intervention in order to improve the following deficits and impairments:  Abnormal gait, Decreased balance, Decreased mobility, Difficulty walking, Decreased strength, Postural dysfunction, Dizziness, Impaired sensation  Visit Diagnosis: Unsteadiness on feet  Other abnormalities of gait and mobility  Other lack of coordination  Muscle weakness (generalized)     Problem List Patient Active Problem List   Diagnosis Date Noted  . Dizziness 11/30/2018  . Atrial fibrillation ([place]) 08/13/2018  . Prediabetes 06/15/2018  . Palpitations 06/05/2018  . Essential hypertension 06/05/2018  . Hyperlipidemia LDL goal <70 06/05/2018  . 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 SVT (supraventricular tachycardia) ([place]) 05/28/2015  . PVC's (premature ventricular contractions) 05/28/2015  . BENIGN POSITIONAL VERTIGO 06/16/2009  . ALLERGIC RHINITIS 06/16/2009    Jones Bales, PT, DPT 08/20/2019, 6:09 PM  [facility] [address] [place], [place], [address] Phone: [phone]   Fax:  [phone]  Name: Mary Mercado MRN: [id number] Date of Birth: 02-Oct-1940

## 2019-08-22 ENCOUNTER — Ambulatory Visit: Payer: Medicare Other

## 2019-08-22 ENCOUNTER — Other Ambulatory Visit: Payer: Self-pay

## 2019-08-22 DIAGNOSIS — R2689 Other abnormalities of gait and mobility: Secondary | ICD-10-CM | POA: Diagnosis not present

## 2019-08-22 DIAGNOSIS — M6281 Muscle weakness (generalized): Secondary | ICD-10-CM

## 2019-08-22 DIAGNOSIS — R209 Unspecified disturbances of skin sensation: Secondary | ICD-10-CM | POA: Diagnosis not present

## 2019-08-22 DIAGNOSIS — R2681 Unsteadiness on feet: Secondary | ICD-10-CM | POA: Diagnosis not present

## 2019-08-22 DIAGNOSIS — R278 Other lack of coordination: Secondary | ICD-10-CM | POA: Diagnosis not present

## 2019-08-22 NOTE — Therapy (Signed)
[facility] [address] [place], [place], [address] Phone: [phone]   Fax:  [phone]  Physical Therapy Treatment  Patient Details  Name: Brittie Osterlund MRN: [id number] Date of Birth: 1940/05/12 Referring Provider (PT): Frann Rider, NP   Encounter Date: 08/22/2019  PT End of Session - 08/22/19 1534    Visit Number  5    Number of Visits  17    Date for PT Re-Evaluation  11/01/19   written for 60 day POC   Authorization Type  Medicare, BCBS (will need 10th visit progress note)    PT Start Time  1530    PT Stop Time  1611    PT Time Calculation (min)  41 min    Equipment Utilized During Treatment  Gait belt    Activity Tolerance  Patient tolerated treatment well    Behavior During Therapy  [facility] for tasks assessed/performed       Past Medical History:  Diagnosis Date  . Allergy   . CVA (cerebral vascular accident) ([place])   . Hyperlipidemia LDL goal <70   . Hypertension   . Palpitations   . Vertigo, benign positional     Past Surgical History:  Procedure Laterality Date  . CARDIOVERSION Left 10/31/2018   Procedure: CARDIOVERSION;  Surgeon: Acie Fredrickson Wonda Cheng, MD;  Location: [facility] ENDOSCOPY;  Service: Cardiovascular;  Laterality: Left;  . Hemilaminectomy and microdiskectomy at L4-5 on the left.  12/10/2003  . TEE WITHOUT CARDIOVERSION N/A 06/05/2018   Procedure: TRANSESOPHAGEAL ECHOCARDIOGRAM (TEE);  Surgeon: Lelon Perla, MD;  Location: [facility] ENDOSCOPY;  Service: Cardiovascular;  Laterality: N/A;  loop    There were no vitals filed for this visit.  Subjective Assessment - 08/22/19 1533    Subjective  No new complaints since last visit. Reports she believes that the head turns are getting better in terms of being able to turn head further, but still having mild dizziness.    Patient is accompained by:  Family member   husband, [name]   Pertinent History  PMH: CVA (2020), HTN, HLD    Limitations   Standing;Walking    How long can you stand comfortably?  5 minutes    How long can you walk comfortably?  10-15 minutes    Patient Stated Goals  wants to feel good - wants to be able to walk and not feel crazy    Currently in Pain?  No/denies                       [facility] Adult PT Treatment/Exercise - 08/22/19 0001      Ambulation/Gait   Ambulation/Gait  Yes    Ambulation/Gait Assistance  5: Supervision    Ambulation/Gait Assistance Details  supv for safety    Ambulation Distance (Feet)  90 Feet    Assistive device  None    Gait Pattern  Step-through pattern;Decreased arm swing - left;Decreased step length - left;Decreased stance time - left;Decreased dorsiflexion - left;Decreased weight shift to left;Decreased trunk rotation;Decreased stride length    Ambulation Surface  Level;Indoor      Neuro Re-ed    Neuro Re-ed Details   Completed standing balance on firm surface, including feet together and horizontal head turns 2 x 1 minute each. Also incorporated horizontal head turns into ambulation with focus on scanning environment. Patient demos slowness of head movements. With increased speed of horizontal head movements demonstrates increased dizziness.       Vestibular Treatment/Exercise -  08/22/19 1552      Vestibular Treatment/Exercise   Vestibular Treatment Provided  Gaze    Gaze Exercises  X1 Viewing Horizontal;X1 Viewing Vertical;X2 Viewing Horizontal      X1 Viewing Horizontal   Comments  completed seated on mat, 3 reps x 1 minute each. progressed to standing 2 reps x 1 minute each, with patient experiencing increasing dizzines with standing.       X1 Viewing Vertical   Comments  completed seated 2 x 1 min, patient reports no dizziness with completion and reports that this is easier to complete versus horizontal       X2 Viewing Horizontal    Comments  completed seated x 1  minute. Patient experiencing increased dizziness rated at 4/10. Requiring frequeny verbal  cues for completion, patient demonstrating difficulty keeping eyes focused on X. Dizziness increasing and require seated rest breaks to allow to return back to 0/10, take 2-3 minutes to return dizziness to prior levels.               PT Short Term Goals - 08/03/19 0940      PT SHORT TERM GOAL #1   Title  Pt will be independent with HEP for improved strength, balance, gait.  ALL STGS DUE 08/31/19    Time  4    Period  Weeks    Status  New    Target Date  08/31/19      PT SHORT TERM GOAL #2   Title  Pt will improve DGI score to at least 12/24 in order to demo decr fall risk.    Baseline  9/24    Time  4    Period  Weeks    Status  New      PT SHORT TERM GOAL #3   Title  Pt will improve gait speed to at least 2.9 ft/sec to demo improved gait efficiency and decr fall risk.    Baseline  2.59 ft/sec    Time  4    Period  Weeks    Status  New      PT SHORT TERM GOAL #4   Title  Pt will decr 5x sit <> stand to 17 seconds or less without UE support from standard chair to demo improved functional LE strength.    Time  4    Period  Weeks    Status  New      PT SHORT TERM GOAL #5   Title  Pt will verbalize understanding of fall prevention in home environment.    Time  4    Period  Weeks    Status  New        PT Long Term Goals - 08/13/19 1648      PT LONG TERM GOAL #1   Title  Pt will verbalize plans for return to community fitness upon d/c from PT and be independent with final HEP.  ALL LTGS DUE 09/28/19    Time  8    Period  Weeks    Status  New      PT LONG TERM GOAL #2   Title  Pt will improve gait velocity to at least 3.2 ft/sec for improved gait efficiency    Baseline  2.59 ft/sec    Time  8    Period  Weeks    Status  New      PT LONG TERM GOAL #3   Title  Pt will improve DGI score to at least 16/24 in  order to demo decr fall risk.    Time  8    Period  Weeks    Status  New      PT LONG TERM GOAL #4   Title  Pt will ambulate at least 1000 ft, indoors and  outdoors, independently, no LOB for improved gait safety and efficiency in community.    Time  8    Period  Weeks    Status  New      PT LONG TERM GOAL #5   Title  Pt will decr 5x sit <> stand to 15 seconds or less without UE support from standard chair to demo improved functional LE strength.    Time  8    Period  Weeks    Status  New      Additional Long Term Goals   Additional Long Term Goals  Yes      PT LONG TERM GOAL #6   Title  Patient will be able to ambulate >1200 ft with 6MWT w/o AD and supv to demonstrate improved endurance    Baseline  935    Time  8    Period  Weeks    Status  New            Plan - 08/22/19 1618    Clinical Impression Statement  Today's skilled PT session focused on further balance with specific focus on vestibular component of balance to help reduce dizziness with head movements. With VOR x 2, patient having increased dizziness with completion and difficulty with keeping eyes focused on target. Requiring extended rest breaks between sets due to dizziness. Patient will continue to benefit from skilled PT services to address balance and improve symptoms.    Personal Factors and Comorbidities  Comorbidity 3+;Time since onset of injury/illness/exacerbation   CVA 05/31/2018; independent and active prior to CVA   Comorbidities  HTN, HLD, SVT, Essential HTN, vertigo (BPPV), hemilaminectomy/miscrodiscectomy 2005    Examination-Activity Limitations  Locomotion Level;Stairs;Stand;Transfers    Examination-Participation Restrictions  Church;Community Activity    Stability/Clinical Decision Making  Stable/Uncomplicated    Rehab Potential  Good    PT Frequency  2x / week    PT Duration  8 weeks    PT Treatment/Interventions  ADLs/Self Care Home Management;DME Instruction;Gait training;Stair training;Functional mobility training;Therapeutic activities;Therapeutic exercise;Balance training;Neuromuscular re-education;Patient/family education;Vestibular    PT Next  Visit Plan  Continue balance (with focus on vestibular component)    Consulted and Agree with Plan of Care  Patient;Family member/caregiver    Family Member Consulted  Husband       Patient will benefit from skilled therapeutic intervention in order to improve the following deficits and impairments:  Abnormal gait, Decreased balance, Decreased mobility, Difficulty walking, Decreased strength, Postural dysfunction, Dizziness, Impaired sensation  Visit Diagnosis: Unsteadiness on feet  Other abnormalities of gait and mobility  Other lack of coordination  Muscle weakness (generalized)     Problem List Patient Active Problem List   Diagnosis Date Noted  . Dizziness 11/30/2018  . Atrial fibrillation ([place]) 08/13/2018  . Prediabetes 06/15/2018  . Palpitations 06/05/2018  . Essential hypertension 06/05/2018  . Hyperlipidemia LDL goal <70 06/05/2018  . 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 SVT (supraventricular tachycardia) ([place]) 05/28/2015  . PVC's (premature ventricular contractions) 05/28/2015  . BENIGN POSITIONAL VERTIGO 06/16/2009  . ALLERGIC RHINITIS 06/16/2009    Jones Bales, PT, DPT 08/22/2019, 4:27 PM  [facility] [address] [place], [place], [address] Phone: [phone]   Fax:  [phone]  Name: Monnie Salamat MRN: [id number] Date of Birth: [birth year]

## 2019-08-22 NOTE — Patient Instructions (Signed)
Access Code: [email or web address] URL: [email or web address] Date: 08/22/2019 Prepared by: Baldomero Lamy  Exercises Romberg Stance with Eyes Closed - 1 x daily - 5 x weekly - 2 sets - 10 reps Romberg Stance with Head Rotation - 1 x daily - 5 x weekly - 2 sets - 10 reps Sit to Stand with Arms Crossed - 1 x daily - 5 x weekly - 2 sets - 10 reps Clamshell - 1 x daily - 5 x weekly - 2 sets - 10 reps Supine Bridge - 1 x daily - 5 x weekly - 2 sets - 10 reps Seated Gaze Stabilization with Head Rotation - 1 x daily - 7 x weekly - 3 sets - 10 reps

## 2019-08-27 ENCOUNTER — Other Ambulatory Visit: Payer: Self-pay

## 2019-08-27 ENCOUNTER — Ambulatory Visit: Payer: Medicare Other | Attending: Adult Health | Admitting: Physical Therapy

## 2019-08-27 DIAGNOSIS — M6281 Muscle weakness (generalized): Secondary | ICD-10-CM

## 2019-08-27 DIAGNOSIS — R2681 Unsteadiness on feet: Secondary | ICD-10-CM | POA: Diagnosis not present

## 2019-08-27 DIAGNOSIS — R278 Other lack of coordination: Secondary | ICD-10-CM | POA: Diagnosis not present

## 2019-08-27 DIAGNOSIS — R2689 Other abnormalities of gait and mobility: Secondary | ICD-10-CM

## 2019-08-27 NOTE — Therapy (Signed)
[facility] [address] [place], [place], [address] Phone: [phone]   Fax:  [phone]  Physical Therapy Treatment  Patient Details  Name: Mary Mercado MRN: [id number] Date of Birth: 1941/03/02 Referring Provider (PT): Frann Rider, NP   Encounter Date: 08/27/2019  PT End of Session - 08/27/19 2128    Visit Number  6    Number of Visits  17    Date for PT Re-Evaluation  11/01/19   written for 60 day POC   Authorization Type  Medicare, BCBS (will need 10th visit progress note)    PT Start Time  1532    PT Stop Time  1613    PT Time Calculation (min)  41 min    Activity Tolerance  Patient tolerated treatment well    Behavior During Therapy  [facility] for tasks assessed/performed       Past Medical History:  Diagnosis Date  . Allergy   . CVA (cerebral vascular accident) ([place])   . Hyperlipidemia LDL goal <70   . Hypertension   . Palpitations   . Vertigo, benign positional     Past Surgical History:  Procedure Laterality Date  . CARDIOVERSION Left 10/31/2018   Procedure: CARDIOVERSION;  Surgeon: Acie Fredrickson Wonda Cheng, MD;  Location: [facility] ENDOSCOPY;  Service: Cardiovascular;  Laterality: Left;  . Hemilaminectomy and microdiskectomy at L4-5 on the left.  12/10/2003  . TEE WITHOUT CARDIOVERSION N/A 06/05/2018   Procedure: TRANSESOPHAGEAL ECHOCARDIOGRAM (TEE);  Surgeon: Lelon Perla, MD;  Location: [facility] ENDOSCOPY;  Service: Cardiovascular;  Laterality: N/A;  loop    There were no vitals filed for this visit.  Subjective Assessment - 08/27/19 1534    Subjective  Has been doing her exercises. Thinks that the dizziness is getting better.    Patient is accompained by:  Family member   husband, Mary Mercado   Pertinent History  PMH: CVA (2020), HTN, HLD    Limitations  Standing;Walking    How long can you stand comfortably?  5 minutes    How long can you walk comfortably?  10-15 minutes    Patient Stated Goals   wants to feel good - wants to be able to walk and not feel crazy    Currently in Pain?  No/denies                        Vestibular Treatment/Exercise - 08/27/19 0001      Vestibular Treatment/Exercise   Vestibular Treatment Provided  Gaze    Gaze Exercises  X1 Viewing Horizontal;X1 Viewing Vertical      X1 Viewing Horizontal   Foot Position  seated with back support    Reps  3    Comments  x10 reps, completed in chair with back support, cues for technique and slightly incr the speed with slightly smaller range of motion, mild symptoms - reviewed for HEP, also discussed putting letter on a solid wall vs. Holding it in hand for proper placement     X1 Viewing Vertical   Foot Position  seated with back support    Reps  2    Comments  x10, no symptoms          Balance Exercises - 08/27/19 2133      Balance Exercises: Standing   Tandem Gait  Foam/compliant surface;4 reps;Limitations    Tandem Gait Limitations  down and back at countertop 4 reps with only intermittent UE support     Other  Standing Exercises  In corner: on 2 pillows feet together eyes closed 3 x 30 seconds,on foam with eyes closed and wider BOS 2 x 10 head turns (5/10 dizziness after 2nd rep and needing seated rest break to calm sx down for approx. 2 minutes), 1 x 10 head nods - min guard for balance. On foam: alternating forward toe taps to cones for SLS x8 reps B, progressing to forward taps to forward cone and then cross tap to 2nd cone x8 reps B, with no UE support, min guard for balance.         PT Education - 08/27/19 2127    Education Details  reviewed seated VOR x1 exercise to HEP, progress towards goals    Person(s) Educated  Patient    Methods  Explanation;Demonstration;Handout    Comprehension  Verbalized understanding;Returned demonstration       PT Short Term Goals - 08/27/19 1538      PT SHORT TERM GOAL #1   Title  Pt will be independent with HEP for improved strength, balance,  gait.  ALL STGS DUE 08/31/19    Time  4    Period  Weeks    Status  New    Target Date  08/31/19      PT SHORT TERM GOAL #2   Title  Pt will improve DGI score to at least 12/24 in order to demo decr fall risk.    Baseline  9/24    Time  4    Period  Weeks    Status  New      PT SHORT TERM GOAL #3   Title  Pt will improve gait speed to at least 2.9 ft/sec to demo improved gait efficiency and decr fall risk.    Baseline  2.61 ft/sec on 08/27/19    Time  4    Period  Weeks    Status  On-going      PT SHORT TERM GOAL #4   Title  Pt will decr 5x sit <> stand to 17 seconds or less without UE support from standard chair to demo improved functional LE strength.    Baseline  12.13 seconds    Time  4    Period  Weeks    Status  Achieved      PT SHORT TERM GOAL #5   Title  Pt will verbalize understanding of fall prevention in home environment.    Time  4    Period  Weeks    Status  New        PT Long Term Goals - 08/13/19 1648      PT LONG TERM GOAL #1   Title  Pt will verbalize plans for return to community fitness upon d/c from PT and be independent with final HEP.  ALL LTGS DUE 09/28/19    Time  8    Period  Weeks    Status  New      PT LONG TERM GOAL #2   Title  Pt will improve gait velocity to at least 3.2 ft/sec for improved gait efficiency    Baseline  2.59 ft/sec    Time  8    Period  Weeks    Status  New      PT LONG TERM GOAL #3   Title  Pt will improve DGI score to at least 16/24 in order to demo decr fall risk.    Time  8    Period  Weeks  Status  New      PT LONG TERM GOAL #4   Title  Pt will ambulate at least 1000 ft, indoors and outdoors, independently, no LOB for improved gait safety and efficiency in community.    Time  8    Period  Weeks    Status  New      PT LONG TERM GOAL #5   Title  Pt will decr 5x sit <> stand to 15 seconds or less without UE support from standard chair to demo improved functional LE strength.    Time  8    Period  Weeks     Status  New      Additional Long Term Goals   Additional Long Term Goals  Yes      PT LONG TERM GOAL #6   Title  Patient will be able to ambulate >1200 ft with 6MWT w/o AD and supv to demonstrate improved endurance    Baseline  935    Time  8    Period  Weeks    Status  New            Plan - 08/28/19 0820    Clinical Impression Statement  Began to asseess pt's STGs - pt met STG #4 in relation to 5x sit <> stand, pt able to perform today in 12.13 seconds with no UE support from standard height chair. Pt did not quite meet goal in regards to gait speed - pt's speed was 2.61 ft/sec indicating a limited community ambulator. Reviewed pt's seated VOR exercise - discussed performed in smaller ranges and slightly incr speed with pt able to perform with no/minimal symptoms. Pt with 2 episodes of dizziness today, one with turning and one with eyes closed on foam with head turns. Symptoms able to subside after a couple mins of seated/standing rest breaks. Overall, pt reports a decr in her dizziness. Pt is progressing well, will be able to progress towards LTGs.    Personal Factors and Comorbidities  Comorbidity 3+;Time since onset of injury/illness/exacerbation   CVA 05/31/2018; independent and active prior to CVA   Comorbidities  HTN, HLD, SVT, Essential HTN, vertigo (BPPV), hemilaminectomy/miscrodiscectomy 2005    Examination-Activity Limitations  Locomotion Level;Stairs;Stand;Transfers    Examination-Participation Restrictions  Church;Community Activity    Stability/Clinical Decision Making  Stable/Uncomplicated    Rehab Potential  Good    PT Frequency  2x / week    PT Duration  8 weeks    PT Treatment/Interventions  ADLs/Self Care Home Management;DME Instruction;Gait training;Stair training;Functional mobility training;Therapeutic activities;Therapeutic exercise;Balance training;Neuromuscular re-education;Patient/family education;Vestibular    PT Next Visit Plan  check remainder of pt's STGs  (re-assess gait speed), VOR x1 in standing, dynamic gait with head motions, Continue balance (with focus on vestibular component)    Consulted and Agree with Plan of Care  Patient;Family member/caregiver    Family Member Consulted  Husband       Patient will benefit from skilled therapeutic intervention in order to improve the following deficits and impairments:  Abnormal gait, Decreased balance, Decreased mobility, Difficulty walking, Decreased strength, Postural dysfunction, Dizziness, Impaired sensation  Visit Diagnosis: Unsteadiness on feet  Other abnormalities of gait and mobility  Other lack of coordination  Muscle weakness (generalized)     Problem List Patient Active Problem List   Diagnosis Date Noted  . Dizziness 11/30/2018  . Atrial fibrillation ([place]) 08/13/2018  . Prediabetes 06/15/2018  . Palpitations 06/05/2018  . Essential hypertension 06/05/2018  . Hyperlipidemia LDL goal <70  06/05/2018  . 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 SVT (supraventricular tachycardia) ([place]) 05/28/2015  . PVC's (premature ventricular contractions) 05/28/2015  . BENIGN POSITIONAL VERTIGO 06/16/2009  . ALLERGIC RHINITIS 06/16/2009    Arliss Journey, PT, DPT  08/28/2019, 8:24 AM  [facility] [address] [place], [place], [address] Phone: [phone]   Fax:  [phone]  Name: Avyanna Spada MRN: [id number] Date of Birth: 1941-02-01

## 2019-08-29 ENCOUNTER — Other Ambulatory Visit: Payer: Self-pay

## 2019-08-29 ENCOUNTER — Ambulatory Visit: Payer: Medicare Other

## 2019-08-29 DIAGNOSIS — R2689 Other abnormalities of gait and mobility: Secondary | ICD-10-CM

## 2019-08-29 DIAGNOSIS — R2681 Unsteadiness on feet: Secondary | ICD-10-CM

## 2019-08-29 DIAGNOSIS — R278 Other lack of coordination: Secondary | ICD-10-CM | POA: Diagnosis not present

## 2019-08-29 DIAGNOSIS — M6281 Muscle weakness (generalized): Secondary | ICD-10-CM | POA: Diagnosis not present

## 2019-08-29 NOTE — Therapy (Signed)
[facility] [address] [place], [place], [address] Phone: [phone]   Fax:  [phone]  Physical Therapy Treatment  Patient Details  Name: Mary Mercado MRN: [id number] Date of Birth: April 03, 1941 Referring Provider (PT): Frann Rider, NP   Encounter Date: 08/29/2019  PT End of Session - 08/29/19 1449    Visit Number  7    Number of Visits  17    Date for PT Re-Evaluation  11/01/19   written for 60 day POC   Authorization Type  Medicare, BCBS (will need 10th visit progress note)    PT Start Time  1400    PT Stop Time  1444    PT Time Calculation (min)  44 min    Equipment Utilized During Treatment  Gait belt    Activity Tolerance  Patient tolerated treatment well    Behavior During Therapy  [facility] for tasks assessed/performed       Past Medical History:  Diagnosis Date  . Allergy   . CVA (cerebral vascular accident) ([place])   . Hyperlipidemia LDL goal <70   . Hypertension   . Palpitations   . Vertigo, benign positional     Past Surgical History:  Procedure Laterality Date  . CARDIOVERSION Left 10/31/2018   Procedure: CARDIOVERSION;  Surgeon: Acie Fredrickson Wonda Cheng, MD;  Location: [facility] ENDOSCOPY;  Service: Cardiovascular;  Laterality: Left;  . Hemilaminectomy and microdiskectomy at L4-5 on the left.  12/10/2003  . TEE WITHOUT CARDIOVERSION N/A 06/05/2018   Procedure: TRANSESOPHAGEAL ECHOCARDIOGRAM (TEE);  Surgeon: Lelon Perla, MD;  Location: [facility] ENDOSCOPY;  Service: Cardiovascular;  Laterality: N/A;  loop    There were no vitals filed for this visit.  Subjective Assessment - 08/29/19 1401    Subjective  No new issues. Exercises going good at home.    Patient is accompained by:  Family member   husband, [name]   Pertinent History  PMH: CVA (2020), HTN, HLD    Limitations  Standing;Walking    How long can you stand comfortably?  5 minutes    How long can you walk comfortably?  10-15 minutes     Patient Stated Goals  wants to feel good - wants to be able to walk and not feel crazy    Currently in Pain?  No/denies                       [facility] Adult PT Treatment/Exercise - 08/29/19 1406      Transfers   Transfers  Sit to Stand;Stand to Sit    Sit to Stand  Without upper extremity assist;From chair/3-in-1    Stand to Sit  5: Supervision;Without upper extremity assist;To chair/3-in-1    Comments  completed x 5 reps      Ambulation/Gait   Ambulation/Gait  Yes    Ambulation/Gait Assistance  5: Supervision    Assistive device  None    Gait Pattern  Step-through pattern;Decreased arm swing - left;Decreased step length - left;Decreased stance time - left;Decreased dorsiflexion - left;Decreased weight shift to left;Decreased trunk rotation;Decreased stride length    Ambulation Surface  Level;Indoor    Gait velocity  11.88 secs = 2.76 ft/sec    Gait Comments  Due to dizziness with turn and stop portion of DGI, completed gait training focused on incorporating 360 deg turns. Completed gait 100' x 6 with incporating R/L 360 deg turns. No increased dizziness noted.       Standardized Balance Assessment  Standardized Balance Assessment  Dynamic Gait Index      Dynamic Gait Index   Level Surface  Normal    Change in Gait Speed  Mild Impairment    Gait with Horizontal Head Turns  Mild Impairment    Gait with Vertical Head Turns  Mild Impairment    Gait and Pivot Turn  Moderate Impairment    Step Over Obstacle  Mild Impairment    Step Around Obstacles  Normal    Steps  Mild Impairment    Total Score  17      Self-Care   Self-Care  Other Self-Care Comments    Other Self-Care Comments   Patient educated on fall prevention within the home and provided with "Falls at Home" checklist to allow for further assessment of hazards within the home. Patient verbalized understanding and agreement.       Neuro Re-ed    Neuro Re-ed Details   Standing Balance on Foam: feet together with  eyes closed 3 x 30 secs, and horizontal head turns 2 x 1 minute each. Also incorporated horizontal head turns into ambulation with naming object with each head turn to demonstrate improved scanning of environment, 1 x 115 ft. Decreased dizziness reports with horizontal head turns in today's session.              PT Education - 08/29/19 1449    Education Details  Completed patient education (Falls at Home Checklist), and progress toward STG/LTG    Person(s) Educated  Patient    Methods  Explanation;Demonstration;Handout    Comprehension  Verbalized understanding       PT Short Term Goals - 08/29/19 1405      PT SHORT TERM GOAL #1   Title  Pt will be independent with HEP for improved strength, balance, gait.  ALL STGS DUE 08/31/19    Baseline  continue to progress HEP at this time    Time  4    Period  Weeks    Status  On-going    Target Date  08/31/19      PT SHORT TERM GOAL #2   Title  Pt will improve DGI score to at least 12/24 in order to demo decr fall risk.    Baseline  9/24, 17/24 on 5/6    Time  4    Period  Weeks    Status  Achieved      PT SHORT TERM GOAL #3   Title  Pt will improve gait speed to at least 2.9 ft/sec to demo improved gait efficiency and decr fall risk.    Baseline  2.61 ft/sec on 08/27/19, 2.76 ft/sec    Time  4    Period  Weeks    Status  On-going      PT SHORT TERM GOAL #4   Title  Pt will decr 5x sit <> stand to 17 seconds or less without UE support from standard chair to demo improved functional LE strength.    Baseline  12.13 seconds    Time  4    Period  Weeks    Status  Achieved      PT SHORT TERM GOAL #5   Title  Pt will verbalize understanding of fall prevention in home environment.    Baseline  Educated on Fall Prevention and Patient verbalized understanding    Time  4    Period  Weeks    Status  Achieved        PT Long Term  Goals - 08/13/19 1648      PT LONG TERM GOAL #1   Title  Pt will verbalize plans for return to  community fitness upon d/c from PT and be independent with final HEP.  ALL LTGS DUE 09/28/19    Time  8    Period  Weeks    Status  New      PT LONG TERM GOAL #2   Title  Pt will improve gait velocity to at least 3.2 ft/sec for improved gait efficiency    Baseline  2.59 ft/sec    Time  8    Period  Weeks    Status  New      PT LONG TERM GOAL #3   Title  Pt will improve DGI score to at least 16/24 in order to demo decr fall risk.    Time  8    Period  Weeks    Status  New      PT LONG TERM GOAL #4   Title  Pt will ambulate at least 1000 ft, indoors and outdoors, independently, no LOB for improved gait safety and efficiency in community.    Time  8    Period  Weeks    Status  New      PT LONG TERM GOAL #5   Title  Pt will decr 5x sit <> stand to 15 seconds or less without UE support from standard chair to demo improved functional LE strength.    Time  8    Period  Weeks    Status  New      Additional Long Term Goals   Additional Long Term Goals  Yes      PT LONG TERM GOAL #6   Title  Patient will be able to ambulate >1200 ft with 6MWT w/o AD and supv to demonstrate improved endurance    Baseline  935    Time  8    Period  Weeks    Status  New            Plan - 08/29/19 1457    Clinical Impression Statement  Finished assessment of patient's progress toward STG's in today's session. Patient met STG in related to DGI, with pt scoring a 17/24. Reassess gait speed with patient ambulating at 2.76 ft/sec today, not quite meeting the goal. Overall patient reporting less dizziness today with horizontal head turns, but did have increaed dizziness with 360 turn during DGI. Educated patient on Fall at [place] to further prevent risk of falls. Patient demonstrating good progress toward goals, and will continue to benefit from skilled PT services.    Personal Factors and Comorbidities  Comorbidity 3+;Time since onset of injury/illness/exacerbation   CVA 05/31/2018; independent  and active prior to CVA   Comorbidities  HTN, HLD, SVT, Essential HTN, vertigo (BPPV), hemilaminectomy/miscrodiscectomy 2005    Examination-Activity Limitations  Locomotion Level;Stairs;Stand;Transfers    Examination-Participation Restrictions  Church;Community Activity    Stability/Clinical Decision Making  Stable/Uncomplicated    Rehab Potential  Good    PT Frequency  2x / week    PT Duration  8 weeks    PT Treatment/Interventions  ADLs/Self Care Home Management;DME Instruction;Gait training;Stair training;Functional mobility training;Therapeutic activities;Therapeutic exercise;Balance training;Neuromuscular re-education;Patient/family education;Vestibular    PT Next Visit Plan  VOR x1 in standing, dynamic gait with head motions, Continue balance (with focus on vestibular component)    Consulted and Agree with Plan of Care  Patient;Family member/caregiver    Family Member Consulted  Husband       Patient will benefit from skilled therapeutic intervention in order to improve the following deficits and impairments:  Abnormal gait, Decreased balance, Decreased mobility, Difficulty walking, Decreased strength, Postural dysfunction, Dizziness, Impaired sensation  Visit Diagnosis: Unsteadiness on feet  Other abnormalities of gait and mobility  Other lack of coordination  Muscle weakness (generalized)     Problem List Patient Active Problem List   Diagnosis Date Noted  . Dizziness 11/30/2018  . Atrial fibrillation ([place]) 08/13/2018  . Prediabetes 06/15/2018  . Palpitations 06/05/2018  . Essential hypertension 06/05/2018  . Hyperlipidemia LDL goal <70 06/05/2018  . 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 SVT (supraventricular tachycardia) ([place]) 05/28/2015  . PVC's (premature ventricular contractions) 05/28/2015  . BENIGN POSITIONAL VERTIGO 06/16/2009  . ALLERGIC RHINITIS 06/16/2009     Jones Bales, PT, DPT 08/29/2019, 3:04 PM  [facility] [address] [place], [place], [address] Phone: [phone]   Fax:  [phone]  Name: Nefertari Rebman MRN: [id number] Date of Birth: 04-18-1941

## 2019-08-29 NOTE — Patient Instructions (Signed)
Access Code: [email or web address] URL: [email or web address] Date: 08/29/2019 Prepared by: Baldomero Lamy  Exercises Romberg Stance with Eyes Closed - 1 x daily - 5 x weekly - 2 sets - 10 reps Romberg Stance with Head Rotation - 1 x daily - 5 x weekly - 2 sets - 10 reps Sit to Stand with Arms Crossed - 1 x daily - 5 x weekly - 2 sets - 10 reps Clamshell - 1 x daily - 5 x weekly - 2 sets - 10 reps Supine Bridge - 1 x daily - 5 x weekly - 2 sets - 10 reps Seated Gaze Stabilization with Head Rotation - 1 x daily - 7 x weekly - 3 sets - 10 reps  Patient Education Falls at [place]

## 2019-09-03 ENCOUNTER — Ambulatory Visit: Payer: Medicare Other

## 2019-09-03 ENCOUNTER — Other Ambulatory Visit: Payer: Self-pay

## 2019-09-03 ENCOUNTER — Ambulatory Visit: Payer: Medicare Other | Admitting: Cardiovascular Disease

## 2019-09-03 DIAGNOSIS — R2681 Unsteadiness on feet: Secondary | ICD-10-CM | POA: Diagnosis not present

## 2019-09-03 DIAGNOSIS — R278 Other lack of coordination: Secondary | ICD-10-CM | POA: Diagnosis not present

## 2019-09-03 DIAGNOSIS — R2689 Other abnormalities of gait and mobility: Secondary | ICD-10-CM

## 2019-09-03 DIAGNOSIS — M6281 Muscle weakness (generalized): Secondary | ICD-10-CM

## 2019-09-03 NOTE — Therapy (Signed)
[facility] [address] [place], [place], [address] Phone: [phone]   Fax:  [phone]  Physical Therapy Treatment  Patient Details  Name: Mary Mercado MRN: [id number] Date of Birth: 1940/07/22 Referring Provider (PT): Frann Rider, NP   Encounter Date: 09/03/2019  PT End of Session - 09/03/19 1451    Visit Number  8    Number of Visits  17    Date for PT Re-Evaluation  11/01/19   written for 60 day POC   Authorization Type  Medicare, BCBS (will need 10th visit progress note)    PT Start Time  1447    PT Stop Time  1530    PT Time Calculation (min)  43 min    Equipment Utilized During Treatment  Gait belt    Activity Tolerance  Patient tolerated treatment well    Behavior During Therapy  [facility] for tasks assessed/performed       Past Medical History:  Diagnosis Date  . Allergy   . CVA (cerebral vascular accident) ([place])   . Hyperlipidemia LDL goal <70   . Hypertension   . Palpitations   . Vertigo, benign positional     Past Surgical History:  Procedure Laterality Date  . CARDIOVERSION Left 10/31/2018   Procedure: CARDIOVERSION;  Surgeon: Acie Fredrickson Wonda Cheng, MD;  Location: [facility] ENDOSCOPY;  Service: Cardiovascular;  Laterality: Left;  . Hemilaminectomy and microdiskectomy at L4-5 on the left.  12/10/2003  . TEE WITHOUT CARDIOVERSION N/A 06/05/2018   Procedure: TRANSESOPHAGEAL ECHOCARDIOGRAM (TEE);  Surgeon: Lelon Perla, MD;  Location: [facility] ENDOSCOPY;  Service: Cardiovascular;  Laterality: N/A;  loop    There were no vitals filed for this visit.  Subjective Assessment - 09/03/19 1448    Subjective  Reports that she has been doing well. Reports did have an increase in dizziness on sunday, believes she "over did it".    Patient is accompained by:  Family member   husband, [name]   Pertinent History  PMH: CVA (2020), HTN, HLD    Limitations  Standing;Walking    How long can you stand comfortably?   5 minutes    How long can you walk comfortably?  10-15 minutes    Patient Stated Goals  wants to feel good - wants to be able to walk and not feel crazy    Currently in Pain?  No/denies                       [facility] Adult PT Treatment/Exercise - 09/03/19 1710      Ambulation/Gait   Ambulation/Gait  Yes    Ambulation/Gait Assistance  5: Supervision    Ambulation Distance (Feet)  115 Feet    Assistive device  None    Gait Pattern  Step-through pattern;Decreased arm swing - left;Decreased step length - left;Decreased stance time - left;Decreased dorsiflexion - left;Decreased weight shift to left;Decreased trunk rotation;Decreased stride length    Ambulation Surface  Level;Indoor      High Level Balance   High Level Balance Activities  Tandem walking;Backward walking    High Level Balance Comments  Completed tandem walking and backwards walking 25' x 4 reps, intermittent CGA required for steadying. with backwards walking pt requiring verbal cues for pace and step length.       Vestibular Treatment/Exercise - 09/03/19 1714      Vestibular Treatment/Exercise   Vestibular Treatment Provided  Gaze    Gaze Exercises  X1 Viewing Horizontal;X1 Viewing  Vertical      X1 Viewing Horizontal   Foot Position  completed seated w/o back support; standing on foam     Reps  2    Comments  x 10 reps, required cues for technique. mild dizziness. Progressed to completing in standing on foam, 2 x 10 reps, mild dizziness.       X1 Viewing Vertical   Foot Position  standing on foam    Reps  2    Comments  x 10, no symptoms         Balance Exercises - 09/03/19 1716      Balance Exercises: Standing   Other Standing Exercises  Completed on rockerboard (positioned ant/post): holding steady x 1 minute with EO, holding steady 2 x 30-45 secs with eyes closed. Completed alternating toe taps to cones, w/o UE support, CGA from PT for steadying. Cues required for form and proper completion. On  blue balance beam: feet together eyes open x 30 seconds, feet together eyes closed 3 x 30 secs, feet together horizontal head turns 2 x 10 reps.           PT Short Term Goals - 08/29/19 1405      PT SHORT TERM GOAL #1   Title  Pt will be independent with HEP for improved strength, balance, gait.  ALL STGS DUE 08/31/19    Baseline  continue to progress HEP at this time    Time  4    Period  Weeks    Status  On-going    Target Date  08/31/19      PT SHORT TERM GOAL #2   Title  Pt will improve DGI score to at least 12/24 in order to demo decr fall risk.    Baseline  9/24, 17/24 on 5/6    Time  4    Period  Weeks    Status  Achieved      PT SHORT TERM GOAL #3   Title  Pt will improve gait speed to at least 2.9 ft/sec to demo improved gait efficiency and decr fall risk.    Baseline  2.61 ft/sec on 08/27/19, 2.76 ft/sec    Time  4    Period  Weeks    Status  On-going      PT SHORT TERM GOAL #4   Title  Pt will decr 5x sit <> stand to 17 seconds or less without UE support from standard chair to demo improved functional LE strength.    Baseline  12.13 seconds    Time  4    Period  Weeks    Status  Achieved      PT SHORT TERM GOAL #5   Title  Pt will verbalize understanding of fall prevention in home environment.    Baseline  Educated on Fall Prevention and Patient verbalized understanding    Time  4    Period  Weeks    Status  Achieved        PT Long Term Goals - 08/13/19 1648      PT LONG TERM GOAL #1   Title  Pt will verbalize plans for return to community fitness upon d/c from PT and be independent with final HEP.  ALL LTGS DUE 09/28/19    Time  8    Period  Weeks    Status  New      PT LONG TERM GOAL #2   Title  Pt will improve gait velocity to at least 3.2  ft/sec for improved gait efficiency    Baseline  2.59 ft/sec    Time  8    Period  Weeks    Status  New      PT LONG TERM GOAL #3   Title  Pt will improve DGI score to at least 16/24 in order to demo decr fall  risk.    Time  8    Period  Weeks    Status  New      PT LONG TERM GOAL #4   Title  Pt will ambulate at least 1000 ft, indoors and outdoors, independently, no LOB for improved gait safety and efficiency in community.    Time  8    Period  Weeks    Status  New      PT LONG TERM GOAL #5   Title  Pt will decr 5x sit <> stand to 15 seconds or less without UE support from standard chair to demo improved functional LE strength.    Time  8    Period  Weeks    Status  New      Additional Long Term Goals   Additional Long Term Goals  Yes      PT LONG TERM GOAL #6   Title  Patient will be able to ambulate >1200 ft with 6MWT w/o AD and supv to demonstrate improved endurance    Baseline  935    Time  8    Period  Weeks    Status  New            Plan - 09/03/19 1721    Clinical Impression Statement  Today's skilled PT session continued progression of balance actvities as tolerated by patient. Overall patient still has mild dizziness with horizontal head turns. Patient will continue to benefit from skilled PT services to improve symptoms, improve balance, and reduce risk for falls.    Personal Factors and Comorbidities  Comorbidity 3+;Time since onset of injury/illness/exacerbation   CVA 05/31/2018; independent and active prior to CVA   Comorbidities  HTN, HLD, SVT, Essential HTN, vertigo (BPPV), hemilaminectomy/miscrodiscectomy 2005    Examination-Activity Limitations  Locomotion Level;Stairs;Stand;Transfers    Examination-Participation Restrictions  Church;Community Activity    Stability/Clinical Decision Making  Stable/Uncomplicated    Rehab Potential  Good    PT Frequency  2x / week    PT Duration  8 weeks    PT Treatment/Interventions  ADLs/Self Care Home Management;DME Instruction;Gait training;Stair training;Functional mobility training;Therapeutic activities;Therapeutic exercise;Balance training;Neuromuscular re-education;Patient/family education;Vestibular    PT Next Visit Plan   Continue VOR, dynamic gait with head motions, Continue balance (with focus on vestibular component)    Consulted and Agree with Plan of Care  Patient;Family member/caregiver    Family Member Consulted  Husband       Patient will benefit from skilled therapeutic intervention in order to improve the following deficits and impairments:  Abnormal gait, Decreased balance, Decreased mobility, Difficulty walking, Decreased strength, Postural dysfunction, Dizziness, Impaired sensation  Visit Diagnosis: Unsteadiness on feet  Other abnormalities of gait and mobility  Other lack of coordination  Muscle weakness (generalized)     Problem List Patient Active Problem List   Diagnosis Date Noted  . Dizziness 11/30/2018  . Atrial fibrillation ([place]) 08/13/2018  . Prediabetes 06/15/2018  . Palpitations 06/05/2018  . Essential hypertension 06/05/2018  . Hyperlipidemia LDL goal <70 06/05/2018  . 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 SVT (supraventricular tachycardia) ([place]) 05/28/2015  . PVC's (premature ventricular contractions) 05/28/2015  . BENIGN POSITIONAL VERTIGO 06/16/2009  . ALLERGIC RHINITIS 06/16/2009    Jones Bales, PT, DPT 09/03/2019, 5:23 PM  [facility] [address] [place], [place], [address] Phone: [phone]   Fax:  [phone]  Name: Mary Mercado MRN: [id number] Date of Birth: [birth year]

## 2019-09-05 ENCOUNTER — Other Ambulatory Visit: Payer: Self-pay

## 2019-09-05 ENCOUNTER — Ambulatory Visit: Payer: Medicare Other

## 2019-09-05 DIAGNOSIS — R2689 Other abnormalities of gait and mobility: Secondary | ICD-10-CM

## 2019-09-05 DIAGNOSIS — M6281 Muscle weakness (generalized): Secondary | ICD-10-CM

## 2019-09-05 DIAGNOSIS — R278 Other lack of coordination: Secondary | ICD-10-CM | POA: Diagnosis not present

## 2019-09-05 DIAGNOSIS — R2681 Unsteadiness on feet: Secondary | ICD-10-CM | POA: Diagnosis not present

## 2019-09-05 NOTE — Therapy (Signed)
[facility] [address] [place], [place], [address] Phone: [phone]   Fax:  [phone]  Physical Therapy Treatment  Patient Details  Name: Mary Mercado MRN: [id number] Date of Birth: 1940-06-27 Referring Provider (PT): Frann Rider, NP   Encounter Date: 09/05/2019  PT End of Session - 09/05/19 1450    Visit Number  9    Number of Visits  17    Date for PT Re-Evaluation  11/01/19   written for 60 day POC   Authorization Type  Medicare, BCBS (will need 10th visit progress note)    PT Start Time  1445    PT Stop Time  1528    PT Time Calculation (min)  43 min    Equipment Utilized During Treatment  Gait belt    Activity Tolerance  Patient tolerated treatment well    Behavior During Therapy  [facility] for tasks assessed/performed       Past Medical History:  Diagnosis Date  . Allergy   . CVA (cerebral vascular accident) ([place])   . Hyperlipidemia LDL goal <70   . Hypertension   . Palpitations   . Vertigo, benign positional     Past Surgical History:  Procedure Laterality Date  . CARDIOVERSION Left 10/31/2018   Procedure: CARDIOVERSION;  Surgeon: Acie Fredrickson Wonda Cheng, MD;  Location: [facility] ENDOSCOPY;  Service: Cardiovascular;  Laterality: Left;  . Hemilaminectomy and microdiskectomy at L4-5 on the left.  12/10/2003  . TEE WITHOUT CARDIOVERSION N/A 06/05/2018   Procedure: TRANSESOPHAGEAL ECHOCARDIOGRAM (TEE);  Surgeon: Lelon Perla, MD;  Location: [facility] ENDOSCOPY;  Service: Cardiovascular;  Laterality: N/A;  loop    There were no vitals filed for this visit.  Subjective Assessment - 09/05/19 1447    Subjective  Reports decreased dizziness with exercises. Overall doing well since last visit.    Patient is accompained by:  Family member   husband, [name]   Pertinent History  PMH: CVA (2020), HTN, HLD    Limitations  Standing;Walking    How long can you stand comfortably?  5 minutes    How long can you walk  comfortably?  10-15 minutes    Patient Stated Goals  wants to feel good - wants to be able to walk and not feel crazy    Currently in Pain?  No/denies                        [facility] Adult PT Treatment/Exercise - 09/05/19 1500      Transfers   Transfers  Sit to Stand;Stand to Sit    Sit to Stand  Without upper extremity assist;From chair/3-in-1;6: Modified independent (Device/Increase time)    Stand to Sit  Without upper extremity assist;To chair/3-in-1;6: Modified independent (Device/Increase time)    Comments  completed 2 x 10 reps of sit <> stands standing on blue mat, w/o UE support for BLE strengthening. Require rest break between sets due to fatigue.       Ambulation/Gait   Ambulation/Gait  Yes    Ambulation/Gait Assistance  5: Supervision    Ambulation/Gait Assistance Details  Completed gait training on outdoor unlevel surfaces including pavement and grass. Supv for safety. Pt decreased gait speed and high reliance on vision over grass surfaces.     Ambulation Distance (Feet)  475 Feet    Assistive device  None    Gait Pattern  Step-through pattern;Decreased arm swing - left;Decreased step length - left;Decreased stance time - left;Decreased dorsiflexion -  left;Decreased weight shift to left;Decreased trunk rotation;Decreased stride length    Ambulation Surface  Unlevel;Outdoor;Paved;Grass      High Level Balance   High Level Balance Activities  Tandem walking;Side stepping    High Level Balance Comments  Completed tandem walking 25' x 4 reps over level surfaces, 25' x 4 reps. CGA throughout. Completed side stepping over orange hurdles x 4 laps. verbal cues for form required.       Neuro Re-ed    Neuro Re-ed Details   Completed obstacle course focused on balance including negotiating over obstacles, toe taps to cone with step over, and tandem walking over blue mat. Completed 20' x 4, CGA throughout. Pt demonstrate difficulty with completing tandem over unlevel surface.                 PT Short Term Goals - 08/29/19 1405      PT SHORT TERM GOAL #1   Title  Pt will be independent with HEP for improved strength, balance, gait.  ALL STGS DUE 08/31/19    Baseline  continue to progress HEP at this time    Time  4    Period  Weeks    Status  On-going    Target Date  08/31/19      PT SHORT TERM GOAL #2   Title  Pt will improve DGI score to at least 12/24 in order to demo decr fall risk.    Baseline  9/24, 17/24 on 5/6    Time  4    Period  Weeks    Status  Achieved      PT SHORT TERM GOAL #3   Title  Pt will improve gait speed to at least 2.9 ft/sec to demo improved gait efficiency and decr fall risk.    Baseline  2.61 ft/sec on 08/27/19, 2.76 ft/sec    Time  4    Period  Weeks    Status  On-going      PT SHORT TERM GOAL #4   Title  Pt will decr 5x sit <> stand to 17 seconds or less without UE support from standard chair to demo improved functional LE strength.    Baseline  12.13 seconds    Time  4    Period  Weeks    Status  Achieved      PT SHORT TERM GOAL #5   Title  Pt will verbalize understanding of fall prevention in home environment.    Baseline  Educated on Fall Prevention and Patient verbalized understanding    Time  4    Period  Weeks    Status  Achieved        PT Long Term Goals - 08/13/19 1648      PT LONG TERM GOAL #1   Title  Pt will verbalize plans for return to community fitness upon d/c from PT and be independent with final HEP.  ALL LTGS DUE 09/28/19    Time  8    Period  Weeks    Status  New      PT LONG TERM GOAL #2   Title  Pt will improve gait velocity to at least 3.2 ft/sec for improved gait efficiency    Baseline  2.59 ft/sec    Time  8    Period  Weeks    Status  New      PT LONG TERM GOAL #3   Title  Pt will improve DGI score to at least 16/24 in  order to demo decr fall risk.    Time  8    Period  Weeks    Status  New      PT LONG TERM GOAL #4   Title  Pt will ambulate at least 1000 ft, indoors  and outdoors, independently, no LOB for improved gait safety and efficiency in community.    Time  8    Period  Weeks    Status  New      PT LONG TERM GOAL #5   Title  Pt will decr 5x sit <> stand to 15 seconds or less without UE support from standard chair to demo improved functional LE strength.    Time  8    Period  Weeks    Status  New      Additional Long Term Goals   Additional Long Term Goals  Yes      PT LONG TERM GOAL #6   Title  Patient will be able to ambulate >1200 ft with 6MWT w/o AD and supv to demonstrate improved endurance    Baseline  935    Time  8    Period  Weeks    Status  New            Plan - 09/05/19 1621    Clinical Impression Statement  Today's skilled PT session included continued progression of balance actvities, as well as gait training on outdoor unlevel surfaces. No dizziness reported bypatient today during session. Pt will continue to benefit from skilled PT services to progress toward all unmet goals.    Personal Factors and Comorbidities  Comorbidity 3+;Time since onset of injury/illness/exacerbation   CVA 05/31/2018; independent and active prior to CVA   Comorbidities  HTN, HLD, SVT, Essential HTN, vertigo (BPPV), hemilaminectomy/miscrodiscectomy 2005    Examination-Activity Limitations  Locomotion Level;Stairs;Stand;Transfers    Examination-Participation Restrictions  Church;Community Activity    Stability/Clinical Decision Making  Stable/Uncomplicated    Rehab Potential  Good    PT Frequency  2x / week    PT Duration  8 weeks    PT Treatment/Interventions  ADLs/Self Care Home Management;DME Instruction;Gait training;Stair training;Functional mobility training;Therapeutic activities;Therapeutic exercise;Balance training;Neuromuscular re-education;Patient/family education;Vestibular    PT Next Visit Plan  Continue high level balance activities (continue focus on vestibular component), Dynamic Gait. Endurance training    Consulted and Agree  with Plan of Care  Patient;Family member/caregiver    Family Member Consulted  Husband       Patient will benefit from skilled therapeutic intervention in order to improve the following deficits and impairments:  Abnormal gait, Decreased balance, Decreased mobility, Difficulty walking, Decreased strength, Postural dysfunction, Dizziness, Impaired sensation  Visit Diagnosis: Unsteadiness on feet  Other abnormalities of gait and mobility  Other lack of coordination  Muscle weakness (generalized)     Problem List Patient Active Problem List   Diagnosis Date Noted  . Dizziness 11/30/2018  . Atrial fibrillation ([place]) 08/13/2018  . Prediabetes 06/15/2018  . Palpitations 06/05/2018  . Essential hypertension 06/05/2018  . Hyperlipidemia LDL goal <70 06/05/2018  . 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 SVT (supraventricular tachycardia) ([place]) 05/28/2015  . PVC's (premature ventricular contractions) 05/28/2015  . BENIGN POSITIONAL VERTIGO 06/16/2009  . ALLERGIC RHINITIS 06/16/2009    Ples Specter, DPT 09/05/2019, 4:24 PM  [facility] [address] [place], [place], [address] Phone: [phone]  Fax:  [phone]  Name: Mary Mercado MRN: [id number] Date of Birth: Nov 23, 1940

## 2019-09-10 ENCOUNTER — Ambulatory Visit: Payer: Medicare Other

## 2019-09-10 ENCOUNTER — Other Ambulatory Visit: Payer: Self-pay

## 2019-09-10 VITALS — BP 128/76 | HR 82

## 2019-09-10 DIAGNOSIS — R2689 Other abnormalities of gait and mobility: Secondary | ICD-10-CM

## 2019-09-10 DIAGNOSIS — R2681 Unsteadiness on feet: Secondary | ICD-10-CM

## 2019-09-10 DIAGNOSIS — R278 Other lack of coordination: Secondary | ICD-10-CM | POA: Diagnosis not present

## 2019-09-10 DIAGNOSIS — M6281 Muscle weakness (generalized): Secondary | ICD-10-CM | POA: Diagnosis not present

## 2019-09-10 NOTE — Therapy (Signed)
[facility] [address] [place], [place], [address] Phone: [phone]   Fax:  [phone]  Physical Therapy Treatment/Progress Note   Patient Details  Name: Mary Mercado MRN: [id number] Date of Birth: June 27, 1940 Referring Provider (PT): Frann Rider, NP  Physical Therapy Progress Note   Dates of Reporting Period: 08/02/19 - 09/10/2019  See Note below for Objective Data and Assessment of Progress/Goals.  Thank you for the referral of this patient. Guillermina [place], PT, DPT  Encounter Date: 09/10/2019  PT End of Session - 09/10/19 1501    Visit Number  10    Number of Visits  17    Date for PT Re-Evaluation  11/01/19   written for 60 day POC   Authorization Type  Medicare, BCBS (will need 10th visit progress note)    PT Start Time  1446    PT Stop Time  1530    PT Time Calculation (min)  44 min    Equipment Utilized During Treatment  Gait belt    Activity Tolerance  Patient tolerated treatment well    Behavior During Therapy  WFL for tasks assessed/performed       Past Medical History:  Diagnosis Date  . Allergy   . CVA (cerebral vascular accident) ([place])   . Hyperlipidemia LDL goal <70   . Hypertension   . Palpitations   . Vertigo, benign positional     Past Surgical History:  Procedure Laterality Date  . CARDIOVERSION Left 10/31/2018   Procedure: CARDIOVERSION;  Surgeon: Acie Fredrickson Wonda Cheng, MD;  Location: [facility] ENDOSCOPY;  Service: Cardiovascular;  Laterality: Left;  . Hemilaminectomy and microdiskectomy at L4-5 on the left.  12/10/2003  . TEE WITHOUT CARDIOVERSION N/A 06/05/2018   Procedure: TRANSESOPHAGEAL ECHOCARDIOGRAM (TEE);  Surgeon: Lelon Perla, MD;  Location: [facility] ENDOSCOPY;  Service: Cardiovascular;  Laterality: N/A;  loop    Vitals:   09/10/19 1453  BP: 128/76  Pulse: 82    Subjective Assessment - 09/10/19 1449    Subjective  Reports that she was very busy all weekend, which was  tiring for her. Pt reports that she felt good throughout, but began to feel jittery this morning. Reports doing HEP.    Patient is accompained by:  Family member   husband, [name]   Pertinent History  PMH: CVA (2020), HTN, HLD    Limitations  Standing;Walking    How long can you stand comfortably?  5 minutes    How long can you walk comfortably?  10-15 minutes    Patient Stated Goals  wants to feel good - wants to be able to walk and not feel crazy    Currently in Pain?  No/denies                        [facility] Adult PT Treatment/Exercise - 09/10/19 1518      Ambulation/Gait   Ambulation/Gait  Yes    Ambulation/Gait Assistance  5: Supervision    Ambulation Distance (Feet)  400 Feet    Assistive device  None    Gait Pattern  Step-through pattern;Decreased arm swing - left;Decreased step length - left;Decreased stance time - left;Decreased dorsiflexion - left;Decreased weight shift to left;Decreased trunk rotation;Decreased stride length    Ambulation Surface  Level;Indoor    Gait Comments  x 1 lap with horizontal head turns, x 1 lap with vertical head turns. PT providing verbal cues for improved pace and proper completion.  High Level Balance   High Level Balance Activities  Turns;Other (comment)   Walking with Eyes Closed   High Level Balance Comments  Completed walking with random 360 deg turns x 5 reps. Patient demonstrating increased dizziness with full turns, 6/10 in today's session and require CGA for steadying. Completed gait with eyes closed to further challenge balance, patient demonstrates increased difficulty with completion.       Neuro Re-ed    Neuro Re-ed Details   Completed alternating toe taps standing on rockerboard (positioned ant/post) to 1st step 1 x 10 reps, progressed to toe taps to 2nd step 1 x 15 reps. Completed with no UE support and intermittent CGA. Completed steadying rockerboard with EC, 3 x 30 secs, no UE support.       Vestibular  Treatment/Exercise - 09/10/19 1510      Vestibular Treatment/Exercise   Vestibular Treatment Provided  Gaze    Habituation Exercises  Standing Diagonal Head Turns    Gaze Exercises  X1 Viewing Horizontal;X1 Viewing Vertical      Standing Diagonal Head Turns   Number of Reps   10   reps, completed with weighted ball with eye/head tracking   Symptiom Description   mild dizziness (4/10) with completion      X1 Viewing Horizontal   Foot Position  completed standing w/o support    Reps  2    Comments  x 10 reps, cues for techniques. little to no dizziness      X1 Viewing Vertical   Foot Position  completed standing w/o UE support    Reps  2    Comments  x 10, no symptoms              PT Short Term Goals - 08/29/19 1405      PT SHORT TERM GOAL #1   Title  Pt will be independent with HEP for improved strength, balance, gait.  ALL STGS DUE 08/31/19    Baseline  continue to progress HEP at this time    Time  4    Period  Weeks    Status  On-going    Target Date  08/31/19      PT SHORT TERM GOAL #2   Title  Pt will improve DGI score to at least 12/24 in order to demo decr fall risk.    Baseline  9/24, 17/24 on 5/6    Time  4    Period  Weeks    Status  Achieved      PT SHORT TERM GOAL #3   Title  Pt will improve gait speed to at least 2.9 ft/sec to demo improved gait efficiency and decr fall risk.    Baseline  2.61 ft/sec on 08/27/19, 2.76 ft/sec    Time  4    Period  Weeks    Status  On-going      PT SHORT TERM GOAL #4   Title  Pt will decr 5x sit <> stand to 17 seconds or less without UE support from standard chair to demo improved functional LE strength.    Baseline  12.13 seconds    Time  4    Period  Weeks    Status  Achieved      PT SHORT TERM GOAL #5   Title  Pt will verbalize understanding of fall prevention in home environment.    Baseline  Educated on Fall Prevention and Patient verbalized understanding    Time  4  Period  Weeks    Status  Achieved         PT Long Term Goals - 08/13/19 1648      PT LONG TERM GOAL #1   Title  Pt will verbalize plans for return to community fitness upon d/c from PT and be independent with final HEP.  ALL LTGS DUE 09/28/19    Time  8    Period  Weeks    Status  New      PT LONG TERM GOAL #2   Title  Pt will improve gait velocity to at least 3.2 ft/sec for improved gait efficiency    Baseline  2.59 ft/sec    Time  8    Period  Weeks    Status  New      PT LONG TERM GOAL #3   Title  Pt will improve DGI score to at least 16/24 in order to demo decr fall risk.    Time  8    Period  Weeks    Status  New      PT LONG TERM GOAL #4   Title  Pt will ambulate at least 1000 ft, indoors and outdoors, independently, no LOB for improved gait safety and efficiency in community.    Time  8    Period  Weeks    Status  New      PT LONG TERM GOAL #5   Title  Pt will decr 5x sit <> stand to 15 seconds or less without UE support from standard chair to demo improved functional LE strength.    Time  8    Period  Weeks    Status  New      Additional Long Term Goals   Additional Long Term Goals  Yes      PT LONG TERM GOAL #6   Title  Patient will be able to ambulate >1200 ft with 6MWT w/o AD and supv to demonstrate improved endurance    Baseline  935    Time  8    Period  Weeks    Status  New            Plan - 09/10/19 1543    Clinical Impression Statement  Continued progression of balance and vestibular treatment today as tolerated by patient. Patient demonstrating improvements in symptoms with VOR exercises. In today's session patient demosntrating increased dizziness with 180 and 360 deg turns during gait, with dizziness rating up to 6/10 but symptoms improved with habituation exercises. Patient will continue to benefit from skilled PT services to address deficits and progress toward all unmet goals.    Personal Factors and Comorbidities  Comorbidity 3+;Time since onset of injury/illness/exacerbation    CVA 05/31/2018; independent and active prior to CVA   Comorbidities  HTN, HLD, SVT, Essential HTN, vertigo (BPPV), hemilaminectomy/miscrodiscectomy 2005    Examination-Activity Limitations  Locomotion Level;Stairs;Stand;Transfers    Examination-Participation Restrictions  Church;Community Activity    Stability/Clinical Decision Making  Stable/Uncomplicated    Rehab Potential  Good    PT Frequency  2x / week    PT Duration  8 weeks    PT Treatment/Interventions  ADLs/Self Care Home Management;DME Instruction;Gait training;Stair training;Functional mobility training;Therapeutic activities;Therapeutic exercise;Balance training;Neuromuscular re-education;Patient/family education;Vestibular    PT Next Visit Plan  Continue high level balance activities (continue focus on vestibular component), Dynamic Gait. Endurance training    Consulted and Agree with Plan of Care  Patient;Family member/caregiver    Family Member Consulted  Husband  Patient will benefit from skilled therapeutic intervention in order to improve the following deficits and impairments:  Abnormal gait, Decreased balance, Decreased mobility, Difficulty walking, Decreased strength, Postural dysfunction, Dizziness, Impaired sensation  Visit Diagnosis: Unsteadiness on feet  Other abnormalities of gait and mobility  Other lack of coordination  Muscle weakness (generalized)     Problem List Patient Active Problem List   Diagnosis Date Noted  . Dizziness 11/30/2018  . Atrial fibrillation ([place]) 08/13/2018  . Prediabetes 06/15/2018  . Palpitations 06/05/2018  . Essential hypertension 06/05/2018  . Hyperlipidemia LDL goal <70 06/05/2018  . 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 SVT (supraventricular tachycardia) ([place]) 05/28/2015  . PVC's (premature ventricular contractions) 05/28/2015  . BENIGN POSITIONAL VERTIGO 06/16/2009  .  ALLERGIC RHINITIS 06/16/2009    Jones Bales, PT, DPT 09/10/2019, 3:47 PM  [facility] [address] [place], [place], [address] Phone: [phone]   Fax:  [phone]  Name: Mary Mercado MRN: [id number] Date of Birth: November 12, 1940

## 2019-09-12 ENCOUNTER — Ambulatory Visit: Payer: Medicare Other

## 2019-09-12 ENCOUNTER — Other Ambulatory Visit: Payer: Self-pay

## 2019-09-12 DIAGNOSIS — R2681 Unsteadiness on feet: Secondary | ICD-10-CM | POA: Diagnosis not present

## 2019-09-12 DIAGNOSIS — R2689 Other abnormalities of gait and mobility: Secondary | ICD-10-CM

## 2019-09-12 DIAGNOSIS — M6281 Muscle weakness (generalized): Secondary | ICD-10-CM | POA: Diagnosis not present

## 2019-09-12 DIAGNOSIS — R278 Other lack of coordination: Secondary | ICD-10-CM

## 2019-09-12 NOTE — Patient Instructions (Signed)
Access Code: [email or web address] URL: [email or web address] Date: 08/29/2019 Prepared by: Baldomero Lamy  Exercises Romberg Stance with Eyes Closed - 1 x daily - 5 x weekly - 2 sets - 10 reps Romberg Stance with Head Rotation - 1 x daily - 5 x weekly - 2 sets - 10 reps Sit to Stand with Arms Crossed - 1 x daily - 5 x weekly - 2 sets - 10 reps Clamshell - 1 x daily - 5 x weekly - 2 sets - 10 reps Supine Bridge - 1 x daily - 5 x weekly - 2 sets - 10 reps Seated Gaze Stabilization with Head Rotation - 1 x daily - 7 x weekly - 3 sets - 10 reps Tandem Walking with Counter Support - 1 x daily - 7 x weekly - 10 reps - 3 sets Walking March - 1 x daily - 7 x weekly - 10 reps - 3 sets Forward Walking with Half Turns - Slow - 1 x daily - 7 x weekly - 3 sets - 5 reps  Patient Education [place] [facility] [place]

## 2019-09-12 NOTE — Therapy (Signed)
[facility] [address] [place], [place], [address] Phone: [phone]   Fax:  [phone]  Physical Therapy Treatment  Patient Details  Name: Mary Mercado MRN: [id number] Date of Birth: 06/06/1940 Referring Provider (PT): Frann Rider, NP   Encounter Date: 09/12/2019  PT End of Session - 09/12/19 1450    Visit Number  11    Number of Visits  17    Date for PT Re-Evaluation  11/01/19   written for 60 day POC   Authorization Type  Medicare, BCBS (will need 10th visit progress note)    PT Start Time  1445    PT Stop Time  1529    PT Time Calculation (min)  44 min    Equipment Utilized During Treatment  Gait belt    Activity Tolerance  Patient tolerated treatment well    Behavior During Therapy  [facility] for tasks assessed/performed       Past Medical History:  Diagnosis Date  . Allergy   . CVA (cerebral vascular accident) ([place])   . Hyperlipidemia LDL goal <70   . Hypertension   . Palpitations   . Vertigo, benign positional     Past Surgical History:  Procedure Laterality Date  . CARDIOVERSION Left 10/31/2018   Procedure: CARDIOVERSION;  Surgeon: Acie Fredrickson Wonda Cheng, MD;  Location: [facility] ENDOSCOPY;  Service: Cardiovascular;  Laterality: Left;  . Hemilaminectomy and microdiskectomy at L4-5 on the left.  12/10/2003  . TEE WITHOUT CARDIOVERSION N/A 06/05/2018   Procedure: TRANSESOPHAGEAL ECHOCARDIOGRAM (TEE);  Surgeon: Lelon Perla, MD;  Location: [facility] ENDOSCOPY;  Service: Cardiovascular;  Laterality: N/A;  loop    There were no vitals filed for this visit.  Subjective Assessment - 09/12/19 1447    Subjective  Patient reports no new issues/complaints. No reports of dizziness since visit on tuesday.    Patient is accompained by:  Family member   husband, [name]   Pertinent History  PMH: CVA (2020), HTN, HLD    Limitations  Standing;Walking    How long can you stand comfortably?  5 minutes    How long can  you walk comfortably?  10-15 minutes    Patient Stated Goals  wants to feel good - wants to be able to walk and not feel crazy    Currently in Pain?  No/denies                        [facility] Adult PT Treatment/Exercise - 09/12/19 1450      Ambulation/Gait   Ambulation/Gait  Yes    Ambulation/Gait Assistance  5: Supervision    Ambulation/Gait Assistance Details  throughout therapy gym with activities     Ambulation Distance (Feet)  250 Feet    Assistive device  None    Gait Pattern  Step-through pattern;Decreased arm swing - left;Decreased step length - left;Decreased stance time - left;Decreased dorsiflexion - left;Decreased weight shift to left;Decreased trunk rotation;Decreased stride length    Ambulation Surface  Level;Indoor      High Level Balance   High Level Balance Activities  Tandem walking;Marching forwards;Negotiating over obstacles;Side stepping    High Level Balance Comments  Completed obstacle course in hallway, Completed tandem walking over blue mat followed by forward step overs cones x 8 laps. Then completed forward marching x 6 with forward step overs (over cones) x 6 laps. Also completed side stepping over cones, verbal cues required for improved hip/knee flexion. All high level balance activities  completed with CGA as needed, and intermittent UE along wall. Also completed tandem walking and marching along countertop on firm surface x 2 laps each. PT educating on completing at home at countertop for improved safety.       Vestibular Treatment/Exercise - 09/12/19 1503      Vestibular Treatment/Exercise   Vestibular Treatment Provided  Habituation    Habituation Exercises  180 degree Turns;360 degree Turns      180 degree Turns   Number of Reps   8    Symptom Description   Completed 8 reps in hallways with 4-5 steps between turns, initially patient completed slow turns, progressed speed of turn. Patient experiencing minor dizziness with completion       360 degree Turns   Number of Reps   5    Symptom Description   Completed 5 turns in hallway, patient reporting increased dizziness with 360 deg turns vs 180 deg.          Balance Exercises - 09/12/19 1908      Balance Exercises: Standing   Rockerboard  --    Other Standing Exercises  Standing on Foam in // bars, completed alternating toe taps to cones x 15 reps. Completed with 4 cones to further challenge limits of stability.         PT Education - 09/12/19 1526    Education Details  Educated on HEP Update (See patient instructions; new additions highlighted)    Person(s) Educated  Patient    Methods  Explanation;Demonstration    Comprehension  Verbalized understanding;Returned demonstration       PT Short Term Goals - 08/29/19 1405      PT SHORT TERM GOAL #1   Title  Pt will be independent with HEP for improved strength, balance, gait.  ALL STGS DUE 08/31/19    Baseline  continue to progress HEP at this time    Time  4    Period  Weeks    Status  On-going    Target Date  08/31/19      PT SHORT TERM GOAL #2   Title  Pt will improve DGI score to at least 12/24 in order to demo decr fall risk.    Baseline  9/24, 17/24 on 5/6    Time  4    Period  Weeks    Status  Achieved      PT SHORT TERM GOAL #3   Title  Pt will improve gait speed to at least 2.9 ft/sec to demo improved gait efficiency and decr fall risk.    Baseline  2.61 ft/sec on 08/27/19, 2.76 ft/sec    Time  4    Period  Weeks    Status  On-going      PT SHORT TERM GOAL #4   Title  Pt will decr 5x sit <> stand to 17 seconds or less without UE support from standard chair to demo improved functional LE strength.    Baseline  12.13 seconds    Time  4    Period  Weeks    Status  Achieved      PT SHORT TERM GOAL #5   Title  Pt will verbalize understanding of fall prevention in home environment.    Baseline  Educated on Fall Prevention and Patient verbalized understanding    Time  4    Period  Weeks    Status   Achieved        PT Long Term Goals - 08/13/19  [place] #1   Title  Pt will verbalize plans for return to community fitness upon d/c from PT and be independent with final HEP.  ALL LTGS DUE 09/28/19    Time  8    Period  Weeks    Status  New      PT LONG TERM GOAL #2   Title  Pt will improve gait velocity to at least 3.2 ft/sec for improved gait efficiency    Baseline  2.59 ft/sec    Time  8    Period  Weeks    Status  New      PT LONG TERM GOAL #3   Title  Pt will improve DGI score to at least 16/24 in order to demo decr fall risk.    Time  8    Period  Weeks    Status  New      PT LONG TERM GOAL #4   Title  Pt will ambulate at least 1000 ft, indoors and outdoors, independently, no LOB for improved gait safety and efficiency in community.    Time  8    Period  Weeks    Status  New      PT LONG TERM GOAL #5   Title  Pt will decr 5x sit <> stand to 15 seconds or less without UE support from standard chair to demo improved functional LE strength.    Time  8    Period  Weeks    Status  New      Additional Long Term Goals   Additional Long Term Goals  Yes      PT LONG TERM GOAL #6   Title  Patient will be able to ambulate >1200 ft with 6MWT w/o AD and supv to demonstrate improved endurance    Baseline  935    Time  8    Period  Weeks    Status  New            Plan - 09/12/19 1917    Clinical Impression Statement  Updated HEP to include tandem walking and marching along countertop due to patients improved balance with this today. Completed habituation exercises, including 180 and 360 deg turns. Patietn continues to report increased dizziness with 360 deg in today's session. Continue to progress as tolerated by patient.    Personal Factors and Comorbidities  Comorbidity 3+;Time since onset of injury/illness/exacerbation   CVA 05/31/2018; independent and active prior to CVA   Comorbidities  HTN, HLD, SVT, Essential HTN, vertigo (BPPV),  hemilaminectomy/miscrodiscectomy 2005    Examination-Activity Limitations  Locomotion Level;Stairs;Stand;Transfers    Examination-Participation Restrictions  Church;Community Activity    Stability/Clinical Decision Making  Stable/Uncomplicated    Rehab Potential  Good    PT Frequency  2x / week    PT Duration  8 weeks    PT Treatment/Interventions  ADLs/Self Care Home Management;DME Instruction;Gait training;Stair training;Functional mobility training;Therapeutic activities;Therapeutic exercise;Balance training;Neuromuscular re-education;Patient/family education;Vestibular    PT Next Visit Plan  How is HEP additions? Continue high level balance activities (continue focus on vestibular component), Dynamic Gait. Endurance training    Consulted and Agree with Plan of Care  Patient;Family member/caregiver    Family Member Consulted  Husband       Patient will benefit from skilled therapeutic intervention in order to improve the following deficits and impairments:  Abnormal gait, Decreased balance, Decreased mobility, Difficulty walking, Decreased strength, Postural dysfunction, Dizziness, Impaired sensation  Visit Diagnosis: Unsteadiness on feet  Other abnormalities of gait and mobility  Other lack of coordination  Muscle weakness (generalized)     Problem List Patient Active Problem List   Diagnosis Date Noted  . Dizziness 11/30/2018  . Atrial fibrillation ([place]) 08/13/2018  . Prediabetes 06/15/2018  . Palpitations 06/05/2018  . Essential hypertension 06/05/2018  . Hyperlipidemia LDL goal <70 06/05/2018  . 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 SVT (supraventricular tachycardia) ([place]) 05/28/2015  . PVC's (premature ventricular contractions) 05/28/2015  . BENIGN POSITIONAL VERTIGO 06/16/2009  . ALLERGIC RHINITIS 06/16/2009    Jones Bales, PT, DPT 09/12/2019, 7:19 PM  [facility] [address] [place], [place], [address] Phone: [phone]   Fax:  [phone]  Name: Mary Mercado MRN: [id number] Date of Birth: 09-30-1940

## 2019-09-17 ENCOUNTER — Encounter: Payer: Self-pay | Admitting: Physical Therapy

## 2019-09-17 ENCOUNTER — Other Ambulatory Visit: Payer: Self-pay

## 2019-09-17 ENCOUNTER — Ambulatory Visit: Payer: Medicare Other | Admitting: Physical Therapy

## 2019-09-17 DIAGNOSIS — R2681 Unsteadiness on feet: Secondary | ICD-10-CM

## 2019-09-17 DIAGNOSIS — R2689 Other abnormalities of gait and mobility: Secondary | ICD-10-CM

## 2019-09-17 DIAGNOSIS — M6281 Muscle weakness (generalized): Secondary | ICD-10-CM

## 2019-09-17 DIAGNOSIS — R278 Other lack of coordination: Secondary | ICD-10-CM | POA: Diagnosis not present

## 2019-09-17 NOTE — Therapy (Signed)
[facility] [address] [place], [place], [address] Phone: [phone]   Fax:  [phone]  Physical Therapy Treatment  Patient Details  Name: Mary Mercado MRN: [id number] Date of Birth: Nov 27, 1940 Referring Provider (PT): Frann Rider, NP   Encounter Date: 09/17/2019  PT End of Session - 09/17/19 1813    Visit Number  12    Number of Visits  17    Date for PT Re-Evaluation  11/01/19   written for 60 day POC   Authorization Type  Medicare, BCBS (will need 10th visit progress note)    PT Start Time  1531    PT Stop Time  1613    PT Time Calculation (min)  42 min    Equipment Utilized During Treatment  Gait belt    Activity Tolerance  Patient tolerated treatment well    Behavior During Therapy  [facility] for tasks assessed/performed       Past Medical History:  Diagnosis Date  . Allergy   . CVA (cerebral vascular accident) ([place])   . Hyperlipidemia LDL goal <70   . Hypertension   . Palpitations   . Vertigo, benign positional     Past Surgical History:  Procedure Laterality Date  . CARDIOVERSION Left 10/31/2018   Procedure: CARDIOVERSION;  Surgeon: Acie Fredrickson Wonda Cheng, MD;  Location: [facility] ENDOSCOPY;  Service: Cardiovascular;  Laterality: Left;  . Hemilaminectomy and microdiskectomy at L4-5 on the left.  12/10/2003  . TEE WITHOUT CARDIOVERSION N/A 06/05/2018   Procedure: TRANSESOPHAGEAL ECHOCARDIOGRAM (TEE);  Surgeon: Lelon Perla, MD;  Location: [facility] ENDOSCOPY;  Service: Cardiovascular;  Laterality: N/A;  loop    There were no vitals filed for this visit.  Subjective Assessment - 09/17/19 1533    Subjective  Got a new pair of sneakers - feels like her balance is better wearing them. Haven't been dizzy today. Turning quickly still gives pt some dizziness.    Patient is accompained by:  Family member   husband, [name]   Pertinent History  PMH: CVA (2020), HTN, HLD    Limitations  Standing;Walking    How  long can you stand comfortably?  5 minutes    How long can you walk comfortably?  10-15 minutes    Patient Stated Goals  wants to feel good - wants to be able to walk and not feel crazy    Currently in Pain?  No/denies                        [facility] Adult PT Treatment/Exercise - 09/17/19 0001      Ambulation/Gait   Ambulation/Gait  Yes    Ambulation/Gait Assistance  5: Supervision;4: Min guard    Ambulation/Gait Assistance Details  scanning enviroment - performing head nods and head turns, no LOB, but pt slowing down gait speed    Ambulation Distance (Feet)  200 Feet    Assistive device  None    Gait Pattern  Step-through pattern;Decreased arm swing - left;Decreased step length - left;Decreased stance time - left;Decreased dorsiflexion - left;Decreased weight shift to left;Decreased trunk rotation;Decreased stride length    Ambulation Surface  Unlevel;Grass    Gait Comments  ambulating 100' with therapist asking pt to turn and stop (performing an 180 degree turn) x3 reps, pt reporting 1/10 dizziness     NMR:   -habituation: Full 360 degree turns using "clock method" while turning  - 5 reps to R initially 3/10 and then decr to  2/10, 5 reps to L started at a 3/10 and then decr down to 2.5/10 -Standing on rockerboard in A/P direction eyes open lifting 1lb ball in a diagonal up to right and then down to left x10 reps, and then x10 reps up to left and down to right, min guard for balance, no dizziness.  -Rockerboard eyes closed in A/P direction 3 x 30 seconds,  and then eyes closed with head motions with fingertip support 2 x 5 reps head nods, 2 x 5 reps head turns (with 3-4/10 dizziness), pt needing breaks between each to let dizziness subside  -Standing on blue foam beam holding tandem stance and using 1lb ball for multi-directional ball toss with PT tech x15 reps B, pt with incr difficulty with LLE posteriorly, needing the counter at times for balance      PT Short Term Goals  - 08/29/19 1405      PT SHORT TERM GOAL #1   Title  Pt will be independent with HEP for improved strength, balance, gait.  ALL STGS DUE 08/31/19    Baseline  continue to progress HEP at this time    Time  4    Period  Weeks    Status  On-going    Target Date  08/31/19      PT SHORT TERM GOAL #2   Title  Pt will improve DGI score to at least 12/24 in order to demo decr fall risk.    Baseline  9/24, 17/24 on 5/6    Time  4    Period  Weeks    Status  Achieved      PT SHORT TERM GOAL #3   Title  Pt will improve gait speed to at least 2.9 ft/sec to demo improved gait efficiency and decr fall risk.    Baseline  2.61 ft/sec on 08/27/19, 2.76 ft/sec    Time  4    Period  Weeks    Status  On-going      PT SHORT TERM GOAL #4   Title  Pt will decr 5x sit <> stand to 17 seconds or less without UE support from standard chair to demo improved functional LE strength.    Baseline  12.13 seconds    Time  4    Period  Weeks    Status  Achieved      PT SHORT TERM GOAL #5   Title  Pt will verbalize understanding of fall prevention in home environment.    Baseline  Educated on Fall Prevention and Patient verbalized understanding    Time  4    Period  Weeks    Status  Achieved        PT Long Term Goals - 08/13/19 1648      PT LONG TERM GOAL #1   Title  Pt will verbalize plans for return to community fitness upon d/c from PT and be independent with final HEP.  ALL LTGS DUE 09/28/19    Time  8    Period  Weeks    Status  New      PT LONG TERM GOAL #2   Title  Pt will improve gait velocity to at least 3.2 ft/sec for improved gait efficiency    Baseline  2.59 ft/sec    Time  8    Period  Weeks    Status  New      PT LONG TERM GOAL #3   Title  Pt will improve DGI score to at  least 16/24 in order to demo decr fall risk.    Time  8    Period  Weeks    Status  New      PT LONG TERM GOAL #4   Title  Pt will ambulate at least 1000 ft, indoors and outdoors, independently, no LOB for improved  gait safety and efficiency in community.    Time  8    Period  Weeks    Status  New      PT LONG TERM GOAL #5   Title  Pt will decr 5x sit <> stand to 15 seconds or less without UE support from standard chair to demo improved functional LE strength.    Time  8    Period  Weeks    Status  New      Additional Long Term Goals   Additional Long Term Goals  Yes      PT LONG TERM GOAL #6   Title  Patient will be able to ambulate >1200 ft with 6MWT w/o AD and supv to demonstrate improved endurance    Baseline  935    Time  8    Period  Weeks    Status  New            Plan - 09/17/19 1819    Clinical Impression Statement  Pt with incr dizziness today with slower 360 degrees turns - however did improve slightly with increased reps. Pt also reporting 4/10 dizziness with eyes closed with head turns on a rockerboard surface, pt needing UE support while performing. Pt is improving well, will continue to progress towards LTGs.    Personal Factors and Comorbidities  Comorbidity 3+;Time since onset of injury/illness/exacerbation   CVA 05/31/2018; independent and active prior to CVA   Comorbidities  HTN, HLD, SVT, Essential HTN, vertigo (BPPV), hemilaminectomy/miscrodiscectomy 2005    Examination-Activity Limitations  Locomotion Level;Stairs;Stand;Transfers    Examination-Participation Restrictions  Church;Community Activity    Stability/Clinical Decision Making  Stable/Uncomplicated    Rehab Potential  Good    PT Frequency  2x / week    PT Duration  8 weeks    PT Treatment/Interventions  ADLs/Self Care Home Management;DME Instruction;Gait training;Stair training;Functional mobility training;Therapeutic activities;Therapeutic exercise;Balance training;Neuromuscular re-education;Patient/family education;Vestibular    PT Next Visit Plan  perform SOT/start checking LTGs for anticipated d/c at end of next week. continue turns for habituation Continue high level balance activities (continue focus on  vestibular component), Dynamic Gait.    Consulted and Agree with Plan of Care  Patient;Family member/caregiver    Family Member Consulted  Husband       Patient will benefit from skilled therapeutic intervention in order to improve the following deficits and impairments:  Abnormal gait, Decreased balance, Decreased mobility, Difficulty walking, Decreased strength, Postural dysfunction, Dizziness, Impaired sensation  Visit Diagnosis: Unsteadiness on feet  Other abnormalities of gait and mobility  Other lack of coordination  Muscle weakness (generalized)     Problem List Patient Active Problem List   Diagnosis Date Noted  . Dizziness 11/30/2018  . Atrial fibrillation ([place]) 08/13/2018  . Prediabetes 06/15/2018  . Palpitations 06/05/2018  . Essential hypertension 06/05/2018  . Hyperlipidemia LDL goal <70 06/05/2018  . 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 SVT (supraventricular tachycardia) ([place]) 05/28/2015  . PVC's (premature ventricular contractions) 05/28/2015  . BENIGN POSITIONAL VERTIGO 06/16/2009  . ALLERGIC RHINITIS 06/16/2009  Arliss Journey, PT, DPT  09/17/2019, 6:21 PM  [facility] [address] [place], [place], [address] Phone: [phone]   Fax:  [phone]  Name: Shenoa Bachtell MRN: [id number] Date of Birth: 1941-01-26

## 2019-09-19 ENCOUNTER — Other Ambulatory Visit: Payer: Self-pay

## 2019-09-19 ENCOUNTER — Ambulatory Visit: Payer: Medicare Other

## 2019-09-19 DIAGNOSIS — R278 Other lack of coordination: Secondary | ICD-10-CM

## 2019-09-19 DIAGNOSIS — R2689 Other abnormalities of gait and mobility: Secondary | ICD-10-CM

## 2019-09-19 DIAGNOSIS — M6281 Muscle weakness (generalized): Secondary | ICD-10-CM

## 2019-09-19 DIAGNOSIS — R2681 Unsteadiness on feet: Secondary | ICD-10-CM

## 2019-09-19 NOTE — Therapy (Signed)
[facility] [address] [place], [place], [address] Phone: [phone]   Fax:  [phone]  Physical Therapy Treatment  Patient Details  Name: Mary Mercado MRN: [id number] Date of Birth: [birth year] Referring Provider (PT): Frann Rider, NP   Encounter Date: 09/19/2019  PT End of Session - 09/19/19 1450    Visit Number  13    Number of Visits  17    Date for PT Re-Evaluation  11/01/19   written for 60 day POC   Authorization Type  Medicare, BCBS (will need 10th visit progress note)    PT Start Time  1445    PT Stop Time  1529    PT Time Calculation (min)  44 min    Equipment Utilized During Treatment  Gait belt    Activity Tolerance  Patient tolerated treatment well    Behavior During Therapy  [facility] for tasks assessed/performed       Past Medical History:  Diagnosis Date  . Allergy   . CVA (cerebral vascular accident) ([place])   . Hyperlipidemia LDL goal <70   . Hypertension   . Palpitations   . Vertigo, benign positional     Past Surgical History:  Procedure Laterality Date  . CARDIOVERSION Left 10/31/2018   Procedure: CARDIOVERSION;  Surgeon: Acie Fredrickson Wonda Cheng, MD;  Location: [facility] ENDOSCOPY;  Service: Cardiovascular;  Laterality: Left;  . Hemilaminectomy and microdiskectomy at L4-5 on the left.  12/10/2003  . TEE WITHOUT CARDIOVERSION N/A 06/05/2018   Procedure: TRANSESOPHAGEAL ECHOCARDIOGRAM (TEE);  Surgeon: Lelon Perla, MD;  Location: [facility] ENDOSCOPY;  Service: Cardiovascular;  Laterality: N/A;  loop    There were no vitals filed for this visit.  Subjective Assessment - 09/19/19 1449    Subjective  Patient no new complaints. No reports of dizziness. No pain. Did that "dizzy medication" this morning.    Patient is accompained by:  Family member   husband, Mary Mercado   Pertinent History  PMH: CVA (2020), HTN, HLD    Limitations  Standing;Walking    How long can you stand comfortably?  5 minutes    How long can you walk comfortably?  10-15 minutes    Patient Stated Goals  wants to feel good - wants to be able to walk and not feel crazy    Currently in Pain?  No/denies                        [facility] Adult PT Treatment/Exercise - 09/19/19 1522      Ambulation/Gait   Ambulation/Gait  Yes    Ambulation/Gait Assistance  6: Modified independent (Device/Increase time)    Ambulation Distance (Feet)  200 Feet    Assistive device  None    Gait Pattern  Step-through pattern;Decreased arm swing - left;Decreased step length - left;Decreased stance time - left;Decreased dorsiflexion - left;Decreased weight shift to left;Decreased trunk rotation;Decreased stride length    Ambulation Surface  Level;Indoor    Gait velocity  10.10 secs = 3.25 ft/sec      Standardized Balance Assessment   Standardized Balance Assessment  Balance Master Testing   see high level balance comments for details     High Level Balance   High Level Balance Comments  Performed SOT on balance master: With Conditions 1 -6, patient was in the green for all trials. This demonstrates an improvement from initial assessment with patient having "fell" on trial 2 and 3 on condition 5, and condition 6 "  fell" with the trial 1. Patient demonstrating improvement and use of vestibular system. With initial assessment, patient vestibular system at approx. 25% and with assessment today at approx. 60-65%. Patient scored above age related norms for all aspects of SOT. Pt demonstrating overall improved balance, and proper use of hip/ankle strategy at this time.              PT Education - 09/19/19 1525    Education Details  Educated on results of Customer service manager, progress toward LTG's    Person(s) Educated  Patient    Methods  Explanation;Handout    Comprehension  Verbalized understanding       PT Short Term Goals - 08/29/19 1405      PT SHORT TERM GOAL #1   Title  Pt will be independent with HEP for  improved strength, balance, gait.  ALL STGS DUE 08/31/19    Baseline  continue to progress HEP at this time    Time  4    Period  Weeks    Status  On-going    Target Date  08/31/19      PT SHORT TERM GOAL #2   Title  Pt will improve DGI score to at least 12/24 in order to demo decr fall risk.    Baseline  9/24, 17/24 on 5/6    Time  4    Period  Weeks    Status  Achieved      PT SHORT TERM GOAL #3   Title  Pt will improve gait speed to at least 2.9 ft/sec to demo improved gait efficiency and decr fall risk.    Baseline  2.61 ft/sec on 08/27/19, 2.76 ft/sec    Time  4    Period  Weeks    Status  On-going      PT SHORT TERM GOAL #4   Title  Pt will decr 5x sit <> stand to 17 seconds or less without UE support from standard chair to demo improved functional LE strength.    Baseline  12.13 seconds    Time  4    Period  Weeks    Status  Achieved      PT SHORT TERM GOAL #5   Title  Pt will verbalize understanding of fall prevention in home environment.    Baseline  Educated on Fall Prevention and Patient verbalized understanding    Time  4    Period  Weeks    Status  Achieved        PT Long Term Goals - 09/19/19 1523      PT LONG TERM GOAL #1   Title  Pt will verbalize plans for return to community fitness upon d/c from PT and be independent with final HEP.  ALL LTGS DUE 09/28/19    Time  8    Period  Weeks    Status  New      PT LONG TERM GOAL #2   Title  Pt will improve gait velocity to at least 3.2 ft/sec for improved gait efficiency    Baseline  2.59 ft/sec, 3.25 ft/sec on 09/19/19    Time  8    Period  Weeks    Status  Achieved      PT LONG TERM GOAL #3   Title  Pt will improve DGI score to at least 16/24 in order to demo decr fall risk.    Time  8    Period  Weeks    Status  New      PT LONG TERM GOAL #4   Title  Pt will ambulate at least 1000 ft, indoors and outdoors, independently, no LOB for improved gait safety and efficiency in community.    Time  8     Period  Weeks    Status  New      PT LONG TERM GOAL #5   Title  Pt will decr 5x sit <> stand to 15 seconds or less without UE support from standard chair to demo improved functional LE strength.    Time  8    Period  Weeks    Status  New      PT LONG TERM GOAL #6   Title  Patient will be able to ambulate >1200 ft with 6MWT w/o AD and supv to demonstrate improved endurance    Baseline  935    Time  8    Period  Weeks    Status  New            Plan - 09/19/19 1526    Clinical Impression Statement  Today's skilled PT session focused on completion of Sensory Organization Test. Patient demonstrating improvement with balance. Patient able to complete all conditions of SOT with no falls, and all above age related norms. Also demonstrated improvement in vestibular system progressing from approx 25% to approx 60-65%. Began assessment of LTG's with patient meeting LTG #2 today. Pt has made progress with PT services, and is on track for discharge at the end of POC.    Personal Factors and Comorbidities  Comorbidity 3+;Time since onset of injury/illness/exacerbation   CVA 05/31/2018; independent and active prior to CVA   Comorbidities  HTN, HLD, SVT, Essential HTN, vertigo (BPPV), hemilaminectomy/miscrodiscectomy 2005    Examination-Activity Limitations  Locomotion Level;Stairs;Stand;Transfers    Examination-Participation Restrictions  Church;Community Activity    Stability/Clinical Decision Making  Stable/Uncomplicated    Rehab Potential  Good    PT Frequency  2x / week    PT Duration  8 weeks    PT Treatment/Interventions  ADLs/Self Care Home Management;DME Instruction;Gait training;Stair training;Functional mobility training;Therapeutic activities;Therapeutic exercise;Balance training;Neuromuscular re-education;Patient/family education;Vestibular    PT Next Visit Plan  check all LTGs. Update HEP and continue high level balance activities.    Consulted and Agree with Plan of Care   Patient;Family member/caregiver    Family Member Consulted  Husband       Patient will benefit from skilled therapeutic intervention in order to improve the following deficits and impairments:  Abnormal gait, Decreased balance, Decreased mobility, Difficulty walking, Decreased strength, Postural dysfunction, Dizziness, Impaired sensation  Visit Diagnosis: Unsteadiness on feet  Other abnormalities of gait and mobility  Other lack of coordination  Muscle weakness (generalized)     Problem List Patient Active Problem List   Diagnosis Date Noted  . Dizziness 11/30/2018  . Atrial fibrillation ([place]) 08/13/2018  . Prediabetes 06/15/2018  . Palpitations 06/05/2018  . Essential hypertension 06/05/2018  . Hyperlipidemia LDL goal <70 06/05/2018  . 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 SVT (supraventricular tachycardia) ([place]) 05/28/2015  . PVC's (premature ventricular contractions) 05/28/2015  . BENIGN POSITIONAL VERTIGO 06/16/2009  . ALLERGIC RHINITIS 06/16/2009    Jones Bales, PT, DPT 09/19/2019, 5:37 PM  [facility] [address] [place], [place], [address] Phone: [phone]   Fax:  [phone]  Name: Mary Mercado [place]  MRN: [id number] Date of Birth: 12-Feb-1941

## 2019-09-24 ENCOUNTER — Encounter: Payer: Self-pay | Admitting: Physical Therapy

## 2019-09-24 ENCOUNTER — Other Ambulatory Visit: Payer: Self-pay

## 2019-09-24 ENCOUNTER — Ambulatory Visit: Payer: Medicare Other | Attending: Adult Health | Admitting: Physical Therapy

## 2019-09-24 DIAGNOSIS — R2681 Unsteadiness on feet: Secondary | ICD-10-CM | POA: Diagnosis present

## 2019-09-24 DIAGNOSIS — M6281 Muscle weakness (generalized): Secondary | ICD-10-CM

## 2019-09-24 DIAGNOSIS — R278 Other lack of coordination: Secondary | ICD-10-CM | POA: Diagnosis present

## 2019-09-24 DIAGNOSIS — R2689 Other abnormalities of gait and mobility: Secondary | ICD-10-CM | POA: Diagnosis present

## 2019-09-24 NOTE — Patient Instructions (Signed)
Access Code: [email or web address] URL: [email or web address] Date: 09/24/2019 Prepared by: Janann August  Exercises Sit to Stand with Arms Crossed - 1 x daily - 5 x weekly - 2 sets - 10 reps Clamshell - 1 x daily - 5 x weekly - 2 sets - 10 reps Supine Bridge - 1 x daily - 5 x weekly - 2 sets - 10 reps Seated Gaze Stabilization with Head Rotation - 1 x daily - 7 x weekly - 3 sets - 10 reps Tandem Walking with Counter Support - 1 x daily - 7 x weekly - 10 reps - 3 sets Walking March - 1 x daily - 7 x weekly - 10 reps - 3 sets Forward Walking with Half Turns - Slow - 1 x daily - 7 x weekly - 3 sets - 5 reps Standing Balance with Eyes Closed on Foam - 1-2 x daily - 5 x weekly - 3 sets - 30 hold Standing with Head Rotation - 1-2 x daily - 5 x weekly - 2 sets - 5 reps  Patient Education Falls at [place]

## 2019-09-24 NOTE — Therapy (Signed)
[facility] [address] [place], [place], [address] Phone: [phone]   Fax:  [phone]  Physical Therapy Treatment  Patient Details  Name: Mary Mercado MRN: [id number] Date of Birth: 03-05-1941 Referring Provider (PT): Frann Rider, NP   Encounter Date: 09/24/2019  PT End of Session - 09/24/19 2159    Visit Number  14    Number of Visits  17    Date for PT Re-Evaluation  11/01/19   written for 60 day POC   Authorization Type  Medicare, BCBS (will need 10th visit progress note)    PT Start Time  1745    PT Stop Time  1828    PT Time Calculation (min)  43 min    Equipment Utilized During Treatment  Gait belt    Activity Tolerance  Patient tolerated treatment well    Behavior During Therapy  [facility] for tasks assessed/performed       Past Medical History:  Diagnosis Date  . Allergy   . CVA (cerebral vascular accident) ([place])   . Hyperlipidemia LDL goal <70   . Hypertension   . Palpitations   . Vertigo, benign positional     Past Surgical History:  Procedure Laterality Date  . CARDIOVERSION Left 10/31/2018   Procedure: CARDIOVERSION;  Surgeon: Acie Fredrickson Wonda Cheng, MD;  Location: [facility] ENDOSCOPY;  Service: Cardiovascular;  Laterality: Left;  . Hemilaminectomy and microdiskectomy at L4-5 on the left.  12/10/2003  . TEE WITHOUT CARDIOVERSION N/A 06/05/2018   Procedure: TRANSESOPHAGEAL ECHOCARDIOGRAM (TEE);  Surgeon: Lelon Perla, MD;  Location: [facility] ENDOSCOPY;  Service: Cardiovascular;  Laterality: N/A;  loop    There were no vitals filed for this visit.  Subjective Assessment - 09/24/19 1747    Subjective  No reports of dizziness.    Patient is accompained by:  Family member   husband, [name]   Pertinent History  PMH: CVA (2020), HTN, HLD    Limitations  Standing;Walking    How long can you stand comfortably?  5 minutes    How long can you walk comfortably?  10-15 minutes    Patient Stated Goals   wants to feel good - wants to be able to walk and not feel crazy    Currently in Pain?  No/denies         [facility] PT Assessment - 09/24/19 1759      Dynamic Gait Index   Level Surface  Normal    Change in Gait Speed  Normal    Gait with Horizontal Head Turns  Mild Impairment    Gait with Vertical Head Turns  Normal    Gait and Pivot Turn  Normal    Step Over Obstacle  Normal    Step Around Obstacles  Normal    Steps  Normal    Total Score  23    DGI comment:  23/24                    [facility] Adult PT Treatment/Exercise - 09/24/19 1759      Transfers   Transfers  Sit to Stand;Stand to Sit    Sit to Stand  Without upper extremity assist;From chair/3-in-1;6: Modified independent (Device/Increase time)    Five time sit to stand comments   13.75 seconds with no UE support from standard chair     Stand to Sit  Without upper extremity assist;To chair/3-in-1;6: Modified independent (Device/Increase time)      Ambulation/Gait   Ambulation/Gait  Yes    Ambulation/Gait Assistance  7: Independent    Ambulation Distance (Feet)  1000 Feet    Assistive device  None    Gait Pattern  Within Functional Limits    Ambulation Surface  Outdoor;Grass;Paved;Unlevel      Self-Care   Self-Care  Other Self-Care Comments    Other Self-Care Comments   due to pt reporting incr anxiety at times and that is one thing that she still struggles with, educated pt on diaphragmatic breathing in order to help calm the nervous system, provided verbal and tactile cues for technique and to inhale through the nose for 2 breaths and out through the nose for 4, pt able to demo understanding and reporting feeling less anxious after performing. discussed with pt about how to use this technique when feeling incr anxiety          Access Code: [email or web address] URL: [email or web address] Date: 09/24/2019 Prepared by: Janann August  Began to review/upgrade pt's HEP:   Exercises Sit to Stand with  Arms Crossed - 1 x daily - 5 x weekly - 2 sets - 10 reps Clamshell - 1 x daily - 5 x weekly - 2 sets - 10 reps Supine Bridge - 1 x daily - 5 x weekly - 2 sets - 10 reps Seated Gaze Stabilization with Head Rotation - 1 x daily - 7 x weekly - 3 sets - 10 reps Tandem Walking with Counter Support - 1 x daily - 7 x weekly - 10 reps - 3 sets Walking March - 1 x daily - 7 x weekly - 10 reps - 3 sets Forward Walking with Half Turns - Slow - 1 x daily - 7 x weekly - 3 sets - 5 reps Standing Balance with Eyes Closed on Foam - 1-2 x daily - 5 x weekly - 3 sets - 30 hold Standing with Head Rotation - 1-2 x daily - 5 x weekly - 2 sets - 5 reps  Patient Education Falls at Home Checklist    PT Education - 09/24/19 2158    Education Details  progress towards goals, D/C at next visit, see self-care    Person(s) Educated  Patient    Methods  Explanation    Comprehension  Verbalized understanding       PT Short Term Goals - 08/29/19 1405      PT SHORT TERM GOAL #1   Title  Pt will be independent with HEP for improved strength, balance, gait.  ALL STGS DUE 08/31/19    Baseline  continue to progress HEP at this time    Time  4    Period  Weeks    Status  On-going    Target Date  08/31/19      PT SHORT TERM GOAL #2   Title  Pt will improve DGI score to at least 12/24 in order to demo decr fall risk.    Baseline  9/24, 17/24 on 5/6    Time  4    Period  Weeks    Status  Achieved      PT SHORT TERM GOAL #3   Title  Pt will improve gait speed to at least 2.9 ft/sec to demo improved gait efficiency and decr fall risk.    Baseline  2.61 ft/sec on 08/27/19, 2.76 ft/sec    Time  4    Period  Weeks    Status  On-going      PT SHORT TERM GOAL #4   Title  Pt will decr 5x sit <> stand to 17 seconds or less without UE support from standard chair to demo improved functional LE strength.    Baseline  12.13 seconds    Time  4    Period  Weeks    Status  Achieved      PT SHORT TERM GOAL #5   Title  Pt  will verbalize understanding of fall prevention in home environment.    Baseline  Educated on Fall Prevention and Patient verbalized understanding    Time  4    Period  Weeks    Status  Achieved        PT Long Term Goals - 09/24/19 1758      PT LONG TERM GOAL #1   Title  Pt will verbalize plans for return to community fitness upon d/c from PT and be independent with final HEP.  ALL LTGS DUE 09/28/19    Time  8    Period  Weeks    Status  New      PT LONG TERM GOAL #2   Title  Pt will improve gait velocity to at least 3.2 ft/sec for improved gait efficiency    Baseline  2.59 ft/sec, 3.25 ft/sec on 09/19/19    Time  8    Period  Weeks    Status  Achieved      PT LONG TERM GOAL #3   Title  Pt will improve DGI score to at least 16/24 in order to demo decr fall risk.    Baseline  23/24    Time  8    Period  Weeks    Status  Achieved      PT LONG TERM GOAL #4   Title  Pt will ambulate at least 1000 ft, indoors and outdoors, independently, no LOB for improved gait safety and efficiency in community.    Baseline  achieved on 09/24/19    Time  8    Period  Weeks    Status  Achieved      PT LONG TERM GOAL #5   Title  Pt will decr 5x sit <> stand to 15 seconds or less without UE support from standard chair to demo improved functional LE strength.    Baseline  13.75 seconds with no UE support from standard chair    Time  8    Period  Weeks    Status  Achieved      PT LONG TERM GOAL #6   Title  Patient will be able to ambulate >1200 ft with 6MWT w/o AD and supv to demonstrate improved endurance    Baseline  935    Time  8    Period  Weeks    Status  New            Plan - 09/24/19 2223    Clinical Impression Statement  Focus of today's skilled session was assessing pt's LTGs. Pt has achieved 4 out of 6 LTGs and has made excellent progress with PT. Pt improved her DGI score to a 23/24 (previously was 17/24). Began to review and upgrade finalized HEP, due to time constraints  will continue at next session. Pt in agreement to discharge after next visit due to progress made in PT.    Personal Factors and Comorbidities  Comorbidity 3+;Time since onset of injury/illness/exacerbation   CVA 05/31/2018; independent and active prior to CVA   Comorbidities  HTN, HLD, SVT, Essential HTN, vertigo (BPPV), hemilaminectomy/miscrodiscectomy 2005  Examination-Activity Limitations  Locomotion Level;Stairs;Stand;Transfers    Examination-Participation Restrictions  Church;Community Activity    Stability/Clinical Decision Making  Stable/Uncomplicated    Rehab Potential  Good    PT Frequency  2x / week    PT Duration  8 weeks    PT Treatment/Interventions  ADLs/Self Care Home Management;DME Instruction;Gait training;Stair training;Functional mobility training;Therapeutic activities;Therapeutic exercise;Balance training;Neuromuscular re-education;Patient/family education;Vestibular    PT Next Visit Plan  finalize HEP. D/C    Consulted and Agree with Plan of Care  Patient;Family member/caregiver    Family Member Consulted  Husband       Patient will benefit from skilled therapeutic intervention in order to improve the following deficits and impairments:  Abnormal gait, Decreased balance, Decreased mobility, Difficulty walking, Decreased strength, Postural dysfunction, Dizziness, Impaired sensation  Visit Diagnosis: Unsteadiness on feet  Other abnormalities of gait and mobility  Other lack of coordination  Muscle weakness (generalized)     Problem List Patient Active Problem List   Diagnosis Date Noted  . Dizziness 11/30/2018  . Atrial fibrillation ([place]) 08/13/2018  . Prediabetes 06/15/2018  . Palpitations 06/05/2018  . Essential hypertension 06/05/2018  . Hyperlipidemia LDL goal <70 06/05/2018  . 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 SVT (supraventricular tachycardia) ([place])  05/28/2015  . PVC's (premature ventricular contractions) 05/28/2015  . BENIGN POSITIONAL VERTIGO 06/16/2009  . ALLERGIC RHINITIS 06/16/2009     Arliss Journey , PT, DPT  09/24/2019, 10:24 PM  [facility] [address] [place], [place], [address] Phone: [phone]   Fax:  [phone]  Name: Mary Mercado MRN: [id number] Date of Birth: 1940-07-16

## 2019-09-26 ENCOUNTER — Ambulatory Visit: Payer: Medicare Other | Admitting: Physical Therapy

## 2019-09-26 ENCOUNTER — Other Ambulatory Visit: Payer: Self-pay

## 2019-09-26 ENCOUNTER — Encounter: Payer: Self-pay | Admitting: Physical Therapy

## 2019-09-26 DIAGNOSIS — R2681 Unsteadiness on feet: Secondary | ICD-10-CM | POA: Diagnosis not present

## 2019-09-26 DIAGNOSIS — R2689 Other abnormalities of gait and mobility: Secondary | ICD-10-CM

## 2019-09-26 DIAGNOSIS — M6281 Muscle weakness (generalized): Secondary | ICD-10-CM

## 2019-09-26 DIAGNOSIS — R278 Other lack of coordination: Secondary | ICD-10-CM

## 2019-09-26 NOTE — Therapy (Signed)
[facility] [address] [place], [place], [address] Phone: [phone]   Fax:  [phone]  Physical Therapy Treatment/Discharge Summary  Patient Details  Name: Mary Mercado MRN: [id number] Date of Birth: [birth year] Referring Provider (PT): Frann Rider, NP   Encounter Date: 09/26/2019  PT End of Session - 09/26/19 1703    Visit Number  15    Number of Visits  17    Date for PT Re-Evaluation  11/01/19   written for 60 day POC   Authorization Type  Medicare, BCBS (will need 10th visit progress note)    PT Start Time  1618    PT Stop Time  1658    PT Time Calculation (min)  40 min    Equipment Utilized During Treatment  Gait belt    Activity Tolerance  Patient tolerated treatment well    Behavior During Therapy  [facility] for tasks assessed/performed       Past Medical History:  Diagnosis Date  . Allergy   . CVA (cerebral vascular accident) ([place])   . Hyperlipidemia LDL goal <70   . Hypertension   . Palpitations   . Vertigo, benign positional     Past Surgical History:  Procedure Laterality Date  . CARDIOVERSION Left 10/31/2018   Procedure: CARDIOVERSION;  Surgeon: Acie Fredrickson Wonda Cheng, MD;  Location: [facility] ENDOSCOPY;  Service: Cardiovascular;  Laterality: Left;  . Hemilaminectomy and microdiskectomy at L4-5 on the left.  12/10/2003  . TEE WITHOUT CARDIOVERSION N/A 06/05/2018   Procedure: TRANSESOPHAGEAL ECHOCARDIOGRAM (TEE);  Surgeon: Lelon Perla, MD;  Location: [facility] ENDOSCOPY;  Service: Cardiovascular;  Laterality: N/A;  loop    There were no vitals filed for this visit.  Subjective Assessment - 09/26/19 1621    Subjective  Is sad because today is her last day, but is feeling very accomplished.    Patient is accompained by:  Family member   husband, [name]   Pertinent History  PMH: CVA (2020), HTN, HLD    Limitations  Standing;Walking    How long can you stand comfortably?  5 minutes    How long can  you walk comfortably?  10-15 minutes    Patient Stated Goals  wants to feel good - wants to be able to walk and not feel crazy    Currently in Pain?  No/denies         [facility] PT Assessment - 09/26/19 1622      6 Minute Walk- Baseline   6 Minute Walk- Baseline  yes    BP (mmHg)  130/76    HR (bpm)  62    Modified Borg Scale for Dyspnea  0- Nothing at all      6 Minute walk- Post Test   6 Minute Walk Post Test  yes    BP (mmHg)  133/77    HR (bpm)  64    Modified Borg Scale for Dyspnea  3- Moderate shortness of breath or breathing difficulty    Perceived Rate of Exertion (Borg)  11- Fairly light      6 minute walk test results    Aerobic Endurance Distance Walked  1100                Access Code: [email or web address] URL: [email or web address] Date: 09/26/2019 Prepared by: Janann August  Reviewed and upgraded standing balance additions.   Exercises Sit to Stand with Arms Crossed - 1 x daily - 5 x weekly - 2 sets - 10 reps Clamshell -  1 x daily - 5 x weekly - 2 sets - 10 reps Supine Bridge - 1 x daily - 5 x weekly - 2 sets - 10 reps Seated Gaze Stabilization with Head Rotation - 1 x daily - 7 x weekly - 3 sets - 10 reps Tandem Walking with Counter Support - 1 x daily - 7 x weekly - 10 reps - 3 sets Walking March - 1 x daily - 7 x weekly - 10 reps - 3 sets Forward Walking with Half Turns - Slow - 1 x daily - 7 x weekly - 3 sets - 5 reps Standing Balance with Eyes Closed on Foam - 1-2 x daily - 5 x weekly - 3 sets - 20-30 hold Standing with Head Rotation - 1-2 x daily - 5 x weekly - 2 sets - 5 reps             PT Education - 09/26/19 1702    Education Details  reviewed HEP for discharge, updated corner balance    Person(s) Educated  Patient    Methods  Explanation;Demonstration;Handout    Comprehension  Verbalized understanding;Returned demonstration       PT Short Term Goals - 08/29/19 1405      PT SHORT TERM GOAL #1   Title  Pt will be  independent with HEP for improved strength, balance, gait.  ALL STGS DUE 08/31/19    Baseline  continue to progress HEP at this time    Time  4    Period  Weeks    Status  On-going    Target Date  08/31/19      PT SHORT TERM GOAL #2   Title  Pt will improve DGI score to at least 12/24 in order to demo decr fall risk.    Baseline  9/24, 17/24 on 5/6    Time  4    Period  Weeks    Status  Achieved      PT SHORT TERM GOAL #3   Title  Pt will improve gait speed to at least 2.9 ft/sec to demo improved gait efficiency and decr fall risk.    Baseline  2.61 ft/sec on 08/27/19, 2.76 ft/sec    Time  4    Period  Weeks    Status  On-going      PT SHORT TERM GOAL #4   Title  Pt will decr 5x sit <> stand to 17 seconds or less without UE support from standard chair to demo improved functional LE strength.    Baseline  12.13 seconds    Time  4    Period  Weeks    Status  Achieved      PT SHORT TERM GOAL #5   Title  Pt will verbalize understanding of fall prevention in home environment.    Baseline  Educated on Fall Prevention and Patient verbalized understanding    Time  4    Period  Weeks    Status  Achieved        PT Long Term Goals - 09/26/19 1634      PT LONG TERM GOAL #1   Title  Pt will verbalize plans for return to community fitness upon d/c from PT and be independent with final HEP.  ALL LTGS DUE 09/28/19    Time  8    Period  Weeks    Status  Achieved      PT LONG TERM GOAL #2   Title  Pt will improve  gait velocity to at least 3.2 ft/sec for improved gait efficiency    Baseline  2.59 ft/sec, 3.25 ft/sec on 09/19/19    Time  8    Period  Weeks    Status  Achieved      PT LONG TERM GOAL #3   Title  Pt will improve DGI score to at least 16/24 in order to demo decr fall risk.    Baseline  23/24    Time  8    Period  Weeks    Status  Achieved      PT LONG TERM GOAL #4   Title  Pt will ambulate at least 1000 ft, indoors and outdoors, independently, no LOB for improved gait  safety and efficiency in community.    Baseline  achieved on 09/24/19    Time  8    Period  Weeks    Status  Achieved      PT LONG TERM GOAL #5   Title  Pt will decr 5x sit <> stand to 15 seconds or less without UE support from standard chair to demo improved functional LE strength.    Baseline  13.75 seconds with no UE support from standard chair    Time  8    Period  Weeks    Status  Achieved      PT LONG TERM GOAL #6   Title  Patient will be able to ambulate >1200 ft with 6MWT w/o AD and supv to demonstrate improved endurance    Baseline  1100 ft on 09/26/19    Time  8    Period  Weeks    Status  Not Met            Plan - 09/26/19 1705    Clinical Impression Statement  Focus of today's skilled session was assessing remainder of pt's LTGs and reviewing pt's HEP and upgrading corner balance exercises. Pt has achieved 5 out of 6 LTGs.Pt did not meet LTG in regards to 6MWT, however did improve distance to 1100 ft. Pt is pleased with progress and will be discharged at this time due to meeting goals.    Personal Factors and Comorbidities  Comorbidity 3+;Time since onset of injury/illness/exacerbation   CVA 05/31/2018; independent and active prior to CVA   Comorbidities  HTN, HLD, SVT, Essential HTN, vertigo (BPPV), hemilaminectomy/miscrodiscectomy 2005    Examination-Activity Limitations  Locomotion Level;Stairs;Stand;Transfers    Examination-Participation Restrictions  Church;Community Activity    Stability/Clinical Decision Making  Stable/Uncomplicated    Rehab Potential  Good    PT Frequency  2x / week    PT Duration  8 weeks    PT Treatment/Interventions  ADLs/Self Care Home Management;DME Instruction;Gait training;Stair training;Functional mobility training;Therapeutic activities;Therapeutic exercise;Balance training;Neuromuscular re-education;Patient/family education;Vestibular    PT Next Visit Plan  D/C    Consulted and Agree with Plan of Care  Patient;Family member/caregiver     Family Member Consulted  Husband       PHYSICAL THERAPY DISCHARGE SUMMARY  Visits from Start of Care: 15  Current functional level related to goals / functional outcomes: See LTGs.   Remaining deficits: Mild dizziness    Education / Equipment: HEP   Plan: Patient agrees to discharge.  Patient goals were met. Patient is being discharged due to meeting the stated rehab goals.  ?????       Patient will benefit from skilled therapeutic intervention in order to improve the following deficits and impairments:  Abnormal gait, Decreased balance, Decreased mobility, Difficulty  walking, Decreased strength, Postural dysfunction, Dizziness, Impaired sensation  Visit Diagnosis: Other abnormalities of gait and mobility  Other lack of coordination  Unsteadiness on feet  Muscle weakness (generalized)     Problem List Patient Active Problem List   Diagnosis Date Noted  . Dizziness 11/30/2018  . Atrial fibrillation ([place]) 08/13/2018  . Prediabetes 06/15/2018  . Palpitations 06/05/2018  . Essential hypertension 06/05/2018  . Hyperlipidemia LDL goal <70 06/05/2018  . 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 SVT (supraventricular tachycardia) ([place]) 05/28/2015  . PVC's (premature ventricular contractions) 05/28/2015  . BENIGN POSITIONAL VERTIGO 06/16/2009  . ALLERGIC RHINITIS 06/16/2009    Arliss Journey, PT, DPT  09/26/2019, 5:07 PM  [facility] [address] [place], [place], [address] Phone: [phone]   Fax:  [phone]  Name: Treazure Nery MRN: [id number] Date of Birth: [birth year]

## 2019-09-26 NOTE — Patient Instructions (Signed)
Access Code: [email or web address] URL: [email or web address] Date: 09/26/2019 Prepared by: Janann August  Exercises Sit to Stand with Arms Crossed - 1 x daily - 5 x weekly - 2 sets - 10 reps Clamshell - 1 x daily - 5 x weekly - 2 sets - 10 reps Supine Bridge - 1 x daily - 5 x weekly - 2 sets - 10 reps Seated Gaze Stabilization with Head Rotation - 1 x daily - 7 x weekly - 3 sets - 10 reps Tandem Walking with Counter Support - 1 x daily - 7 x weekly - 10 reps - 3 sets Walking March - 1 x daily - 7 x weekly - 10 reps - 3 sets Forward Walking with Half Turns - Slow - 1 x daily - 7 x weekly - 3 sets - 5 reps Standing Balance with Eyes Closed on Foam - 1-2 x daily - 5 x weekly - 3 sets - 20-30 hold Standing with Head Rotation - 1-2 x daily - 5 x weekly - 2 sets - 5 reps

## 2019-11-04 ENCOUNTER — Other Ambulatory Visit: Payer: Self-pay | Admitting: Cardiovascular Disease

## 2019-11-12 DIAGNOSIS — L728 Other follicular cysts of the skin and subcutaneous tissue: Secondary | ICD-10-CM | POA: Diagnosis not present

## 2019-11-12 DIAGNOSIS — D485 Neoplasm of uncertain behavior of skin: Secondary | ICD-10-CM | POA: Diagnosis not present

## 2019-11-12 DIAGNOSIS — Z85828 Personal history of other malignant neoplasm of skin: Secondary | ICD-10-CM | POA: Diagnosis not present

## 2019-11-12 DIAGNOSIS — L570 Actinic keratosis: Secondary | ICD-10-CM | POA: Diagnosis not present

## 2019-11-12 DIAGNOSIS — D0472 Carcinoma in situ of skin of left lower limb, including hip: Secondary | ICD-10-CM | POA: Diagnosis not present

## 2019-11-12 DIAGNOSIS — D1801 Hemangioma of skin and subcutaneous tissue: Secondary | ICD-10-CM | POA: Diagnosis not present

## 2019-11-12 DIAGNOSIS — L814 Other melanin hyperpigmentation: Secondary | ICD-10-CM | POA: Diagnosis not present

## 2019-11-12 DIAGNOSIS — D225 Melanocytic nevi of trunk: Secondary | ICD-10-CM | POA: Diagnosis not present

## 2019-11-12 DIAGNOSIS — L905 Scar conditions and fibrosis of skin: Secondary | ICD-10-CM | POA: Diagnosis not present

## 2019-11-12 DIAGNOSIS — L821 Other seborrheic keratosis: Secondary | ICD-10-CM | POA: Diagnosis not present

## 2019-11-14 ENCOUNTER — Other Ambulatory Visit: Payer: Self-pay

## 2019-11-14 ENCOUNTER — Ambulatory Visit (INDEPENDENT_AMBULATORY_CARE_PROVIDER_SITE_OTHER): Payer: Medicare Other | Admitting: Family Medicine

## 2019-11-14 ENCOUNTER — Encounter: Payer: Self-pay | Admitting: Family Medicine

## 2019-11-14 VITALS — BP 120/80 | HR 65 | Temp 98.0°F | Ht 65.0 in | Wt 142.0 lb

## 2019-11-14 DIAGNOSIS — I4819 Other persistent atrial fibrillation: Secondary | ICD-10-CM | POA: Diagnosis not present

## 2019-11-14 DIAGNOSIS — I6502 Occlusion and stenosis of left vertebral artery: Secondary | ICD-10-CM | POA: Diagnosis not present

## 2019-11-14 DIAGNOSIS — E785 Hyperlipidemia, unspecified: Secondary | ICD-10-CM | POA: Diagnosis not present

## 2019-11-14 DIAGNOSIS — I10 Essential (primary) hypertension: Secondary | ICD-10-CM

## 2019-11-14 DIAGNOSIS — Z209 Contact with and (suspected) exposure to unspecified communicable disease: Secondary | ICD-10-CM | POA: Diagnosis not present

## 2019-11-14 DIAGNOSIS — R7303 Prediabetes: Secondary | ICD-10-CM | POA: Diagnosis not present

## 2019-11-14 DIAGNOSIS — I63411 Cerebral infarction due to embolism of right middle cerebral artery: Secondary | ICD-10-CM | POA: Diagnosis not present

## 2019-11-14 NOTE — Progress Notes (Signed)
   Subjective:    Patient ID: Mary Mercado, female    DOB: 14-Jul-1940, 79 y.o.   MRN: [id number]  HPI Here with her husband to fill out medical forms for [employer] a [employer] in [place]. They plan to move there later this year. She feels well and has no concerns today.    Review of Systems  Constitutional: Negative.   Respiratory: Negative.   Cardiovascular: Negative.   Gastrointestinal: Negative.   Genitourinary: Negative.   Neurological: Negative.        Objective:   Physical Exam Constitutional:      Appearance: Normal appearance. She is not ill-appearing.  Cardiovascular:     Rate and Rhythm: Normal rate. Rhythm irregular.     Pulses: Normal pulses.     Heart sounds: Normal heart sounds.  Pulmonary:     Effort: Pulmonary effort is normal.     Breath sounds: Normal breath sounds.  Neurological:     General: No focal deficit present.     Mental Status: She is alert and oriented to person, place, and time.           Assessment & Plan:  She is in very good health all in all. We will send her for labs and we will fill out all the forms that are needed for her to move in as above.  [name], MD

## 2019-11-15 ENCOUNTER — Ambulatory Visit (INDEPENDENT_AMBULATORY_CARE_PROVIDER_SITE_OTHER): Payer: Medicare Other | Admitting: *Deleted

## 2019-11-15 DIAGNOSIS — Z111 Encounter for screening for respiratory tuberculosis: Secondary | ICD-10-CM

## 2019-11-15 LAB — BASIC METABOLIC PANEL
BUN: 10 mg/dL (ref 7–25)
CO2: 30 mmol/L (ref 20–32)
Calcium: 9.7 mg/dL (ref 8.6–10.4)
Chloride: 101 mmol/L (ref 98–110)
Creat: 0.66 mg/dL (ref 0.60–0.88)
Glucose, Bld: 114 mg/dL — ABNORMAL HIGH (ref 65–99)
Potassium: 4.1 mmol/L (ref 3.5–5.3)
Sodium: 139 mmol/L (ref 135–146)

## 2019-11-15 LAB — CBC WITH DIFFERENTIAL/PLATELET
Absolute Monocytes: 536 cells/uL (ref 200–950)
Basophils Absolute: 57 cells/uL (ref 0–200)
Basophils Relative: 1 %
Eosinophils Absolute: 80 cells/uL (ref 15–500)
Eosinophils Relative: 1.4 %
HCT: 42.5 % (ref 35.0–45.0)
Hemoglobin: 14 g/dL (ref 11.7–15.5)
Lymphs Abs: 1328 cells/uL (ref 850–3900)
MCH: 30 pg (ref 27.0–33.0)
MCHC: 32.9 g/dL (ref 32.0–36.0)
MCV: 91.2 fL (ref 80.0–100.0)
MPV: 11.4 fL (ref 7.5–12.5)
Monocytes Relative: 9.4 %
Neutro Abs: 3699 cells/uL (ref 1500–7800)
Neutrophils Relative %: 64.9 %
Platelets: 226 10*3/uL (ref 140–400)
RBC: 4.66 10*6/uL (ref 3.80–5.10)
RDW: 12.1 % (ref 11.0–15.0)
Total Lymphocyte: 23.3 %
WBC: 5.7 10*3/uL (ref 3.8–10.8)

## 2019-11-15 LAB — URINALYSIS
Bilirubin Urine: NEGATIVE
Glucose, UA: NEGATIVE
Hgb urine dipstick: NEGATIVE
Ketones, ur: NEGATIVE
Nitrite: NEGATIVE
Protein, ur: NEGATIVE
Specific Gravity, Urine: 1.009 (ref 1.001–1.03)
pH: <=5 (ref 5.0–8.0)

## 2019-11-15 LAB — RPR: RPR Ser Ql: NONREACTIVE

## 2019-11-15 LAB — HEPATIC FUNCTION PANEL
AG Ratio: 1.8 (calc) (ref 1.0–2.5)
ALT: 16 U/L (ref 6–29)
AST: 17 U/L (ref 10–35)
Albumin: 4.8 g/dL (ref 3.6–5.1)
Alkaline phosphatase (APISO): 74 U/L (ref 37–153)
Bilirubin, Direct: 0.2 mg/dL (ref 0.0–0.2)
Globulin: 2.6 g/dL (calc) (ref 1.9–3.7)
Indirect Bilirubin: 0.8 mg/dL (calc) (ref 0.2–1.2)
Total Bilirubin: 1 mg/dL (ref 0.2–1.2)
Total Protein: 7.4 g/dL (ref 6.1–8.1)

## 2019-11-15 LAB — HEMOGLOBIN A1C
Hgb A1c MFr Bld: 5.7 % of total Hgb — ABNORMAL HIGH (ref <5.7)
Mean Plasma Glucose: 117 (calc)
eAG (mmol/L): 6.5 (calc)

## 2019-11-15 LAB — TSH: TSH: 1.48 mIU/L (ref 0.40–4.50)

## 2019-11-15 LAB — LIPID PANEL
Cholesterol: 229 mg/dL — ABNORMAL HIGH (ref <200)
HDL: 121 mg/dL (ref >=50)
LDL Cholesterol (Calc): 90 mg/dL (calc)
Non-HDL Cholesterol (Calc): 108 mg/dL (calc) (ref <130)
Total CHOL/HDL Ratio: 1.9 (calc) (ref <5.0)
Triglycerides: 87 mg/dL (ref <150)

## 2019-11-15 NOTE — Progress Notes (Signed)
Per orders of Dr. Sarajane Jews, injection of PPD given by Zacarias Pontes. Patient tolerated injection well.

## 2019-11-18 ENCOUNTER — Telehealth: Payer: Self-pay

## 2019-11-18 ENCOUNTER — Encounter: Payer: Self-pay | Admitting: *Deleted

## 2019-11-18 LAB — TB SKIN TEST
Induration: 0 mm
TB Skin Test: NEGATIVE

## 2019-11-18 NOTE — Telephone Encounter (Addendum)
Pt had an appt. Last week and gave Dr.Fry papers to fill out for [facility] retirement homes. Ppw has been completed and mailed to [facility] per request. Copy placed in the scans. Pt notified of update. No further action needed.

## 2019-12-31 ENCOUNTER — Telehealth: Payer: Self-pay | Admitting: Cardiovascular Disease

## 2019-12-31 NOTE — Telephone Encounter (Signed)
Spoke with patient who states she has had multiple episodes of fast HR recently. She has managed at home with breathing exercises and rest. She took diltiazem 30 mg on occasion for the sustained tachycardia and HR has decreased. She denies additional symptoms of lightheadedness, dizziness, or SOB. She had cardioversion July 2020 and has felt well since that time. She denies recent changes in medical history. Reports she feels well today. She has an appointment with Dr. Acie Fredrickson on 9/9 and understands and agrees to continue current treatment plan until that time. States she has not missed a dose of Eliquis in 1 year. She thanked me for the call.

## 2019-12-31 NOTE — Telephone Encounter (Signed)
STAT if HR is under 50 or over 120 (normal HR is 60-100 beats per minute)  1) What is your heart rate? 116 Saturday or Sunday when resting  2) Do you have a log of your heart rate readings (document readings)? no  3) Do you have any other symptoms? No   Patient states on Saturday or Sunday her HR was very high and she had to take diltiazem. She has an appointment 01/02/2020

## 2020-01-02 ENCOUNTER — Encounter: Payer: Self-pay | Admitting: Cardiovascular Disease

## 2020-01-02 ENCOUNTER — Other Ambulatory Visit: Payer: Self-pay

## 2020-01-02 ENCOUNTER — Ambulatory Visit (INDEPENDENT_AMBULATORY_CARE_PROVIDER_SITE_OTHER): Payer: Medicare Other | Admitting: Cardiovascular Disease

## 2020-01-02 VITALS — BP 116/88 | HR 143 | Ht 65.0 in | Wt 142.6 lb

## 2020-01-02 DIAGNOSIS — I4891 Unspecified atrial fibrillation: Secondary | ICD-10-CM

## 2020-01-02 DIAGNOSIS — I4819 Other persistent atrial fibrillation: Secondary | ICD-10-CM | POA: Diagnosis not present

## 2020-01-02 DIAGNOSIS — I6502 Occlusion and stenosis of left vertebral artery: Secondary | ICD-10-CM

## 2020-01-02 LAB — BASIC METABOLIC PANEL
BUN/Creatinine Ratio: 13 (ref 12–28)
BUN: 11 mg/dL (ref 8–27)
CO2: 24 mmol/L (ref 20–29)
Calcium: 9.8 mg/dL (ref 8.7–10.3)
Chloride: 99 mmol/L (ref 96–106)
Creatinine, Ser: 0.84 mg/dL (ref 0.57–1.00)
GFR calc Af Amer: 76 mL/min/{1.73_m2} (ref >59)
GFR calc non Af Amer: 66 mL/min/{1.73_m2} (ref >59)
Glucose: 111 mg/dL — ABNORMAL HIGH (ref 65–99)
Potassium: 4.1 mmol/L (ref 3.5–5.2)
Sodium: 139 mmol/L (ref 134–144)

## 2020-01-02 LAB — CBC
Hematocrit: 41.5 % (ref 34.0–46.6)
Hemoglobin: 13.9 g/dL (ref 11.1–15.9)
MCH: 30.2 pg (ref 26.6–33.0)
MCHC: 33.5 g/dL (ref 31.5–35.7)
MCV: 90 fL (ref 79–97)
Platelets: 264 10*3/uL (ref 150–450)
RBC: 4.61 x10E6/uL (ref 3.77–5.28)
RDW: 12.3 % (ref 11.7–15.4)
WBC: 8.5 10*3/uL (ref 3.4–10.8)

## 2020-01-02 MED ORDER — FLECAINIDE ACETATE 50 MG PO TABS: 50.0000 mg | ORAL_TABLET | Freq: Two times a day (BID) | ORAL | 3 refills | Status: DC

## 2020-01-02 MED ORDER — DILTIAZEM HCL ER COATED BEADS 180 MG PO CP24: ORAL_CAPSULE | ORAL | 3 refills | Status: DC

## 2020-01-02 NOTE — H&P (View-Only) (Signed)
Cardiology Office Note:    Date:  01/02/2020   ID:  [facility] [name], [name] 01-30-1941, MRN [id number]  PCP:  [name], MD  Cardiologist:  Mertie Moores, MD  Electrophysiologist:  None   Referring MD: [name], MD    Problem List 1. Atrial Fib:    CHADS2VASC =  5   ( female, age 79, CVA Chief Complaint  Patient presents with  . Atrial Fibrillation    Aug. 7, 2020     Mary Mercado is a 79 y.o. female with a hx of rapid atrial fib.    We cardioverted her on July 7. Is doing great.   Is having some dizziness.  Constant.  Not related to orthostatic.  Is not dizzy when she is lying down      Thinks its due to the metoprolol   Nov. 9, 2020  Mary Mercado is seen today for follow up of her atrial fib Doing well No cp Breathing is ok Has some left lower rib soreness.  Has lots of dizziness.   Especially after starting the telmisartan .    Reviewed her BP log  No readings below 100 .   Most blood pressure readings since starting the telmisartan are in the normal range.  Her blood pressure was mildly elevated before starting telmisartan.  August 09, 2019: Mary Mercado is seen today for follow-up of her atrial fibrillation.  She had a successful cardioversion July, 2020.  She remains in normal sinus rhythm. Dizziness has improved.   Having back pain - is on ultram   Has the sensation of quivering inside - she does not think its a HR irregularity   Sept. 9, 2021:   Seen for follow up . Has PAF - is in atrial fib today  Has had palpitations for the past several weeks.  Has been on eliquis without interruption.  Tried some Dilt 30 mg tabks Having some chest twinges  -   Past Medical History:  Diagnosis Date  . Allergy   . CVA (cerebral vascular accident) ([place])   . Hyperlipidemia LDL goal <70   . Hypertension   . Palpitations   . Vertigo, benign positional     Past Surgical History:  Procedure Laterality Date  . CARDIOVERSION Left 10/31/2018    Procedure: CARDIOVERSION;  Surgeon: Acie Fredrickson Wonda Cheng, MD;  Location: [facility] ENDOSCOPY;  Service: Cardiovascular;  Laterality: Left;  . Hemilaminectomy and microdiskectomy at L4-5 on the left.  12/10/2003  . TEE WITHOUT CARDIOVERSION N/A 06/05/2018   Procedure: TRANSESOPHAGEAL ECHOCARDIOGRAM (TEE);  Surgeon: Lelon Perla, MD;  Location: [facility] ENDOSCOPY;  Service: Cardiovascular;  Laterality: N/A;  loop    Current Medications: Current Meds  Medication Sig  . atorvastatin (LIPITOR) 40 MG tablet TAKE 1 TABLET BY MOUTH EVERY DAY AT 6 PM  . diltiazem (CARDIZEM CD) 180 MG 24 hr capsule TAKE 1 CAPSULE BY MOUTH EVERY DAY  . diltiazem (CARDIZEM) 30 MG tablet Take 30 mg by mouth 4 (four) times daily.  Marland Kitchen ELIQUIS 5 MG TABS tablet TAKE 1 TABLET BY MOUTH TWICE A DAY  . telmisartan (MICARDIS) 20 MG tablet TAKE 1 TABLET BY MOUTH EVERY DAY  . [DISCONTINUED] diltiazem (CARDIZEM CD) 180 MG 24 hr capsule TAKE 1 CAPSULE BY MOUTH EVERY DAY     Allergies:   Duloxetine and Acetaminophen   Social History   Socioeconomic History  . Marital status: Married    Spouse name: Not on file  . Number of children: Not on  file  . Years of education: Not on file  . Highest education level: Not on file  Occupational History  . Not on file  Tobacco Use  . Smoking status: Never Smoker  . Smokeless tobacco: Never Used  Vaping Use  . Vaping Use: Never used  Substance and Sexual Activity  . Alcohol use: No  . Drug use: No  . Sexual activity: Not on file  Other Topics Concern  . Not on file  Social History Narrative  . Not on file   Social Determinants of Health   Financial Resource Strain:   . Difficulty of Paying Living Expenses: Not on file  Food Insecurity:   . Worried About Charity fundraiser in the Last Year: Not on file  . Ran Out of Food in the Last Year: Not on file  Transportation Needs:   . Lack of Transportation (Medical): Not on file  . Lack of Transportation (Non-Medical): Not on file  Physical  Activity:   . Days of Exercise per Week: Not on file  . Minutes of Exercise per Session: Not on file  Stress:   . Feeling of Stress : Not on file  Social Connections:   . Frequency of Communication with Friends and Family: Not on file  . Frequency of Social Gatherings with Friends and Family: Not on file  . Attends Religious Services: Not on file  . Active Member of Clubs or Organizations: Not on file  . Attends Archivist Meetings: Not on file  . Marital Status: Not on file     Family History: The patient's family history includes Colon cancer in her father; Diabetes in her mother; Heart attack in her mother; Heart attack (age of onset: 4) in her brother.  ROS:   Please see the history of present illness.     All other systems reviewed and are negative.  EKGs/Labs/Other Studies Reviewed:    The following studies were reviewed today:        Recent Labs: 11/14/2019: ALT 16; TSH 1.48 01/02/2020: BUN 11; Creatinine, Ser 0.84; Hemoglobin 13.9; Platelets 264; Potassium 4.1; Sodium 139  Recent Lipid Panel    Component Value Date/Time   CHOL 229 (H) 11/14/2019 1102   TRIG 87 11/14/2019 1102   HDL 121 11/14/2019 1102   CHOLHDL 1.9 11/14/2019 1102   VLDL 14.6 12/24/2018 1239   LDLCALC 90 11/14/2019 1102     Physical Exam: Blood pressure 116/88, pulse (!) 143, height 5\' 5"  (1.651 m), weight 142 lb 9.6 oz (64.7 kg), SpO2 99 %.  GEN:  Well nourished, slightly uncomfortable  HEENT: Normal NECK: No JVD; No carotid bruits LYMPHATICS: No lymphadenopathy CARDIAC: irreg. Irreg.  RESPIRATORY:  Clear to auscultation without rales, wheezing or rhonchi  ABDOMEN: Soft, non-tender, non-distended MUSCULOSKELETAL:  No edema; No deformity  SKIN: Warm and dry NEUROLOGIC:  Alert and oriented x 3  EKG:   Sept 9, 2021:  Afib with RVR  . HR 143.  NS St / T abn  ASSESSMENT:    1. Persistent atrial fibrillation ([place])   2. Atrial fibrillation with rapid ventricular response (HCC)     PLAN:      1.  Atrial fibrillation:  She is back on Afib with RVR. Will have her cardioverted Start flecainide 50 mg BID. Cont . Dilt  CD cardioversion next week Stress myoview 1-2 days after cardioversion ( assuming she converts)  Follow up in 4-6 weeks     2.  Dizziness:   Seems  to have improved.       Medication Adjustments/Labs and Tests Ordered: Current medicines are reviewed at length with the patient today.  Concerns regarding medicines are outlined above.  Orders Placed This Encounter  Procedures  . CBC  . Basic metabolic panel  . MYOCARDIAL PERFUSION IMAGING  . EKG 12-Lead   Meds ordered this encounter  Medications  . diltiazem (CARDIZEM CD) 180 MG 24 hr capsule    Sig: TAKE 1 CAPSULE BY MOUTH EVERY DAY    Dispense:  90 capsule    Refill:  3  . flecainide (TAMBOCOR) 50 MG tablet    Sig: Take 1 tablet (50 mg total) by mouth 2 (two) times daily.    Dispense:  180 tablet    Refill:  3     Patient Instructions  Medication Instructions:  Your physician has recommended you make the following change in your medication:   START: flecainide 50 mg twice a day   *If you need a refill on your cardiac medications before your next appointment, please call your pharmacy*   Lab Work: TODAY: CBC, BMET  If you have labs (blood work) drawn today and your tests are completely normal, you will receive your results only by: Marland Kitchen MyChart Message (if you have MyChart) OR . A paper copy in the mail If you have any lab test that is abnormal or we need to change your treatment, we will call you to review the results.   Testing/Procedures: Your physician has recommended that you have a Cardioversion (DCCV). Electrical Cardioversion uses a jolt of electricity to your heart either through paddles or wired patches attached to your chest. This is a controlled, usually prescheduled, procedure. Defibrillation is done under light anesthesia in the hospital, and you usually go  home the day of the procedure. This is done to get your heart back into a normal rhythm. You are not awake for the procedure. Please see the instruction sheet given to you today.  Your physician has requested that you have en exercise stress myoview. For further information please visit [email or web address]. Please follow instruction sheet, as given.  Follow-Up: Follow up with Dr. Acie Fredrickson or APP in 4-6 weeks  Other Instructions  You are scheduled for a Cardioversion on 01/08/20 with Dr. Margaretann Loveless.  Please arrive at the [facility] (Main Entrance A) at [facility]: [address] [place], [place] [address] at 8:00 AM. (1 hour prior to procedure).  DIET: Nothing to eat or drink after midnight except a sip of water with medications (see medication instructions below)  Medication Instructions:  Continue your anticoagulant: ELIQUIS You will need to continue your anticoagulant after your procedure until you are told by your Provider that it is safe to stop   Labs: TODAY: CBC, BMET  Due to recent COVID-19 restrictions implemented by our local and state authorities and in an effort to keep both patients and staff as safe as possible, our hospital system requires COVID-19 testing prior to certain scheduled hospital procedures.  Please go to [place]. [place], [place] [address] on 01/04/20 at 11:50 AM.  This is a drive up testing site.  You will not need to exit your vehicle.  You will not be billed at the time of testing but may receive a bill later depending on your insurance. You must agree to self-quarantine from the time of your testing until the procedure date on 01/08/20.  This should included staying home with ONLY the people you live with.  Avoid take-out, grocery store shopping or leaving the house for any non-emergent reason.  Failure to have your COVID-19 test done on the date and time you have been scheduled will result in cancellation of your procedure.  Please call our office at  [phone] if you have any questions.   You must have a responsible person to drive you home and stay in the waiting area during your procedure. Failure to do so could result in cancellation.  Bring your insurance cards.  *Special Note: Every effort is made to have your procedure done on time. Occasionally there are emergencies that occur at the hospital that may cause delays. Please be patient if a delay does occur.       Signed, Mertie Moores, MD  01/02/2020 5:04 PM    [place]

## 2020-01-02 NOTE — Patient Instructions (Addendum)
Medication Instructions:  Your physician has recommended you make the following change in your medication:   START: flecainide 50 mg twice a day   *If you need a refill on your cardiac medications before your next appointment, please call your pharmacy*   Lab Work: TODAY: CBC, BMET  If you have labs (blood work) drawn today and your tests are completely normal, you will receive your results only by: Marland Kitchen MyChart Message (if you have MyChart) OR . A paper copy in the mail If you have any lab test that is abnormal or we need to change your treatment, we will call you to review the results.   Testing/Procedures: Your physician has recommended that you have a Cardioversion (DCCV). Electrical Cardioversion uses a jolt of electricity to your heart either through paddles or wired patches attached to your chest. This is a controlled, usually prescheduled, procedure. Defibrillation is done under light anesthesia in the hospital, and you usually go home the day of the procedure. This is done to get your heart back into a normal rhythm. You are not awake for the procedure. Please see the instruction sheet given to you today.  Your physician has requested that you have en exercise stress myoview. For further information please visit [email or web address]. Please follow instruction sheet, as given.  Follow-Up: Follow up with Dr. Acie Fredrickson or APP in 4-6 weeks  Other Instructions  You are scheduled for a Cardioversion on 01/08/20 with Dr. Margaretann Loveless.  Please arrive at the [facility] (Main Entrance A) at [facility]: [address] [place], [place] [address] at 8:00 AM. (1 hour prior to procedure).  DIET: Nothing to eat or drink after midnight except a sip of water with medications (see medication instructions below)  Medication Instructions:  Continue your anticoagulant: ELIQUIS You will need to continue your anticoagulant after your procedure until you are told by your Provider that it is safe to  stop   Labs: TODAY: CBC, BMET  Due to recent COVID-19 restrictions implemented by our local and state authorities and in an effort to keep both patients and staff as safe as possible, our hospital system requires COVID-19 testing prior to certain scheduled hospital procedures.  Please go to [address]. [place], [place] [address] on 01/04/20 at 11:50 AM.  This is a drive up testing site.  You will not need to exit your vehicle.  You will not be billed at the time of testing but may receive a bill later depending on your insurance. You must agree to self-quarantine from the time of your testing until the procedure date on 01/08/20.  This should included staying home with ONLY the people you live with.  Avoid take-out, grocery store shopping or leaving the house for any non-emergent reason.  Failure to have your COVID-19 test done on the date and time you have been scheduled will result in cancellation of your procedure.  Please call our office at [phone] if you have any questions.   You must have a responsible person to drive you home and stay in the waiting area during your procedure. Failure to do so could result in cancellation.  Bring your insurance cards.  *Special Note: Every effort is made to have your procedure done on time. Occasionally there are emergencies that occur at the hospital that may cause delays. Please be patient if a delay does occur.

## 2020-01-02 NOTE — Progress Notes (Signed)
Cardiology Office Note:    Date:  01/02/2020   ID:  [facility] Laini, Urick 1940-12-08, MRN [id number]  PCP:  [name], MD  Cardiologist:  Mertie Moores, MD  Electrophysiologist:  None   Referring MD: [name], MD    Problem List 1. Atrial Fib:    CHADS2VASC =  5   ( female, age 79, CVA Chief Complaint  Patient presents with  . Atrial Fibrillation    Aug. 7, 2020     Mary Mercado is a 78 y.o. female with a hx of rapid atrial fib.    We cardioverted her on July 7. Is doing great.   Is having some dizziness.  Constant.  Not related to orthostatic.  Is not dizzy when she is lying down      Thinks its due to the metoprolol   Nov. 9, 2020  [name] is seen today for follow up of her atrial fib Doing well No cp Breathing is ok Has some left lower rib soreness.  Has lots of dizziness.   Especially after starting the telmisartan .    Reviewed her BP log  No readings below 100 .   Most blood pressure readings since starting the telmisartan are in the normal range.  Her blood pressure was mildly elevated before starting telmisartan.  August 09, 2019: [name] is seen today for follow-up of her atrial fibrillation.  She had a successful cardioversion July, 2020.  She remains in normal sinus rhythm. Dizziness has improved.   Having back pain - is on ultram   Has the sensation of quivering inside - she does not think its a HR irregularity   Sept. 9, 2021:   Seen for follow up . Has PAF - is in atrial fib today  Has had palpitations for the past several weeks.  Has been on eliquis without interruption.  Tried some Dilt 30 mg tabks Having some chest twinges  -   Past Medical History:  Diagnosis Date  . Allergy   . CVA (cerebral vascular accident) ([place])   . Hyperlipidemia LDL goal <70   . Hypertension   . Palpitations   . Vertigo, benign positional     Past Surgical History:  Procedure Laterality Date  . CARDIOVERSION Left 10/31/2018    Procedure: CARDIOVERSION;  Surgeon: Acie Fredrickson Wonda Cheng, MD;  Location: [facility] ENDOSCOPY;  Service: Cardiovascular;  Laterality: Left;  . Hemilaminectomy and microdiskectomy at L4-5 on the left.  12/10/2003  . TEE WITHOUT CARDIOVERSION N/A 06/05/2018   Procedure: TRANSESOPHAGEAL ECHOCARDIOGRAM (TEE);  Surgeon: Lelon Perla, MD;  Location: [facility] ENDOSCOPY;  Service: Cardiovascular;  Laterality: N/A;  loop    Current Medications: Current Meds  Medication Sig  . atorvastatin (LIPITOR) 40 MG tablet TAKE 1 TABLET BY MOUTH EVERY DAY AT 6 PM  . diltiazem (CARDIZEM CD) 180 MG 24 hr capsule TAKE 1 CAPSULE BY MOUTH EVERY DAY  . diltiazem (CARDIZEM) 30 MG tablet Take 30 mg by mouth 4 (four) times daily.  Marland Kitchen ELIQUIS 5 MG TABS tablet TAKE 1 TABLET BY MOUTH TWICE A DAY  . telmisartan (MICARDIS) 20 MG tablet TAKE 1 TABLET BY MOUTH EVERY DAY  . [DISCONTINUED] diltiazem (CARDIZEM CD) 180 MG 24 hr capsule TAKE 1 CAPSULE BY MOUTH EVERY DAY     Allergies:   Duloxetine and Acetaminophen   Social History   Socioeconomic History  . Marital status: Married    Spouse name: Not on file  . Number of children: Not on  file  . Years of education: Not on file  . Highest education level: Not on file  Occupational History  . Not on file  Tobacco Use  . Smoking status: Never Smoker  . Smokeless tobacco: Never Used  Vaping Use  . Vaping Use: Never used  Substance and Sexual Activity  . Alcohol use: No  . Drug use: No  . Sexual activity: Not on file  Other Topics Concern  . Not on file  Social History Narrative  . Not on file   Social Determinants of Health   Financial Resource Strain:   . Difficulty of Paying Living Expenses: Not on file  Food Insecurity:   . Worried About Charity fundraiser in the Last Year: Not on file  . Ran Out of Food in the Last Year: Not on file  Transportation Needs:   . Lack of Transportation (Medical): Not on file  . Lack of Transportation (Non-Medical): Not on file  Physical  Activity:   . Days of Exercise per Week: Not on file  . Minutes of Exercise per Session: Not on file  Stress:   . Feeling of Stress : Not on file  Social Connections:   . Frequency of Communication with Friends and Family: Not on file  . Frequency of Social Gatherings with Friends and Family: Not on file  . Attends Religious Services: Not on file  . Active Member of Clubs or Organizations: Not on file  . Attends Archivist Meetings: Not on file  . Marital Status: Not on file     Family History: The patient's family history includes Colon cancer in her father; Diabetes in her mother; Heart attack in her mother; Heart attack (age of onset: 24) in her brother.  ROS:   Please see the history of present illness.     All other systems reviewed and are negative.  EKGs/Labs/Other Studies Reviewed:    The following studies were reviewed today:        Recent Labs: 11/14/2019: ALT 16; TSH 1.48 01/02/2020: BUN 11; Creatinine, Ser 0.84; Hemoglobin 13.9; Platelets 264; Potassium 4.1; Sodium 139  Recent Lipid Panel    Component Value Date/Time   CHOL 229 (H) 11/14/2019 1102   TRIG 87 11/14/2019 1102   HDL 121 11/14/2019 1102   CHOLHDL 1.9 11/14/2019 1102   VLDL 14.6 12/24/2018 1239   LDLCALC 90 11/14/2019 1102     Physical Exam: Blood pressure 116/88, pulse (!) 143, height 5\' 5"  (1.651 m), weight 142 lb 9.6 oz (64.7 kg), SpO2 99 %.  GEN:  Well nourished, slightly uncomfortable  HEENT: Normal NECK: No JVD; No carotid bruits LYMPHATICS: No lymphadenopathy CARDIAC: irreg. Irreg.  RESPIRATORY:  Clear to auscultation without rales, wheezing or rhonchi  ABDOMEN: Soft, non-tender, non-distended MUSCULOSKELETAL:  No edema; No deformity  SKIN: Warm and dry NEUROLOGIC:  Alert and oriented x 3  EKG:   Sept 9, 2021:  Afib with RVR  . HR 143.  NS St / T abn  ASSESSMENT:    1. Persistent atrial fibrillation ([place])   2. Atrial fibrillation with rapid ventricular response (HCC)     PLAN:      1.  Atrial fibrillation:  She is back on Afib with RVR. Will have her cardioverted Start flecainide 50 mg BID. Cont . Dilt  CD cardioversion next week Stress myoview 1-2 days after cardioversion ( assuming she converts)  Follow up in 4-6 weeks     2.  Dizziness:   Seems  to have improved.       Medication Adjustments/Labs and Tests Ordered: Current medicines are reviewed at length with the patient today.  Concerns regarding medicines are outlined above.  Orders Placed This Encounter  Procedures  . CBC  . Basic metabolic panel  . MYOCARDIAL PERFUSION IMAGING  . EKG 12-Lead   Meds ordered this encounter  Medications  . diltiazem (CARDIZEM CD) 180 MG 24 hr capsule    Sig: TAKE 1 CAPSULE BY MOUTH EVERY DAY    Dispense:  90 capsule    Refill:  3  . flecainide (TAMBOCOR) 50 MG tablet    Sig: Take 1 tablet (50 mg total) by mouth 2 (two) times daily.    Dispense:  180 tablet    Refill:  3     Patient Instructions  Medication Instructions:  Your physician has recommended you make the following change in your medication:   START: flecainide 50 mg twice a day   *If you need a refill on your cardiac medications before your next appointment, please call your pharmacy*   Lab Work: TODAY: CBC, BMET  If you have labs (blood work) drawn today and your tests are completely normal, you will receive your results only by: Marland Kitchen MyChart Message (if you have MyChart) OR . A paper copy in the mail If you have any lab test that is abnormal or we need to change your treatment, we will call you to review the results.   Testing/Procedures: Your physician has recommended that you have a Cardioversion (DCCV). Electrical Cardioversion uses a jolt of electricity to your heart either through paddles or wired patches attached to your chest. This is a controlled, usually prescheduled, procedure. Defibrillation is done under light anesthesia in the hospital, and you usually go  home the day of the procedure. This is done to get your heart back into a normal rhythm. You are not awake for the procedure. Please see the instruction sheet given to you today.  Your physician has requested that you have en exercise stress myoview. For further information please visit [email or web address]. Please follow instruction sheet, as given.  Follow-Up: Follow up with Dr. Acie Fredrickson or APP in 4-6 weeks  Other Instructions  You are scheduled for a Cardioversion on 01/08/20 with Dr. Margaretann Loveless.  Please arrive at the [facility] (Main Entrance A) at [facility]: [address] [place], [place] [address] at 8:00 AM. (1 hour prior to procedure).  DIET: Nothing to eat or drink after midnight except a sip of water with medications (see medication instructions below)  Medication Instructions:  Continue your anticoagulant: ELIQUIS You will need to continue your anticoagulant after your procedure until you are told by your Provider that it is safe to stop   Labs: TODAY: CBC, BMET  Due to recent COVID-19 restrictions implemented by our local and state authorities and in an effort to keep both patients and staff as safe as possible, our hospital system requires COVID-19 testing prior to certain scheduled hospital procedures.  Please go to [address]. [place], [place] [address] on 01/04/20 at 11:50 AM.  This is a drive up testing site.  You will not need to exit your vehicle.  You will not be billed at the time of testing but may receive a bill later depending on your insurance. You must agree to self-quarantine from the time of your testing until the procedure date on 01/08/20.  This should included staying home with ONLY the people you live with.  Avoid take-out, grocery store shopping or leaving the house for any non-emergent reason.  Failure to have your COVID-19 test done on the date and time you have been scheduled will result in cancellation of your procedure.  Please call our office at  [phone] if you have any questions.   You must have a responsible person to drive you home and stay in the waiting area during your procedure. Failure to do so could result in cancellation.  Bring your insurance cards.  *Special Note: Every effort is made to have your procedure done on time. Occasionally there are emergencies that occur at the hospital that may cause delays. Please be patient if a delay does occur.       Signed, Mertie Moores, MD  01/02/2020 5:04 PM    [place]

## 2020-01-04 ENCOUNTER — Other Ambulatory Visit (HOSPITAL_COMMUNITY)
Admission: RE | Admit: 2020-01-04 | Discharge: 2020-01-04 | Disposition: A | Discharge status: Home / Self Care | Payer: Medicare Other | Source: Ambulatory Visit | Attending: Internal Medicine | Admitting: Internal Medicine

## 2020-01-04 DIAGNOSIS — Z20822 Contact with and (suspected) exposure to covid-19: Secondary | ICD-10-CM | POA: Diagnosis not present

## 2020-01-04 DIAGNOSIS — Z01818 Encounter for other preprocedural examination: Secondary | ICD-10-CM | POA: Diagnosis not present

## 2020-01-04 LAB — SARS CORONAVIRUS 2 (TAT 6-24 HRS): SARS Coronavirus 2: NEGATIVE

## 2020-01-07 ENCOUNTER — Encounter (HOSPITAL_COMMUNITY): Payer: Self-pay | Admitting: Internal Medicine

## 2020-01-07 NOTE — Anesthesia Preprocedure Evaluation (Addendum)
Anesthesia Evaluation  Patient identified by MRN, date of birth, ID band Patient awake    Reviewed: Allergy & Precautions, NPO status , Patient's Chart, lab work & pertinent test results, reviewed documented beta blocker date and time   Airway Mallampati: I  TM Distance: >3 FB Neck ROM: Full    Dental no notable dental hx. (+) Caps, Dental Advisory Given   Pulmonary neg pulmonary ROS,    Pulmonary exam normal breath sounds clear to auscultation       Cardiovascular hypertension, Pt. on medications + dysrhythmias Atrial Fibrillation  Rhythm:Irregular Rate:Tachycardia  EKG 01/02/19 Atrial fibrillation with RVR, NSSTT wave changes  Echo TEE 06/05/18 1. The left ventricle has normal systolic function of 21-97%. The cavity size was normal. No evidence of left ventricular regional wall motion abnormalities.  2. No evidence of left ventricular regional wall motion abnormalities.  3. The right ventricle has normal systolic function. The cavity was normal.  4. Left atrial size was severely dilated.  5. Right atrial size was severely dilated.  6. Trivial pericardial effusion.  7. The mitral valve is normal in structure. There is mild thickening.  8. Moderate tricuspid valve prolapse.  9. 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  10. The aortic valve is tricuspid There is mild thickening of the aortic valve.  11. The pulmonic valve was grossly normal. Pulmonic valve regurgitation is mild by color flow Doppler.  12. Normal LV function; severe LAE; no LAA thrombus; severe RAE; sclerotic aortic valve with mild AI; mild MR; TV prolapse with moderate to severe TR; negative saline microcavitation study.    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 CVA, Residual Symptoms negative psych ROS   GI/Hepatic negative GI ROS, Neg liver ROS,   Endo/Other   HYperlipidemia  Renal/GU negative Renal ROS  negative genitourinary   Musculoskeletal negative musculoskeletal ROS (+)   Abdominal   Peds  Hematology Eliquis therapy- last dose this am   Anesthesia Other Findings   Reproductive/Obstetrics                            Anesthesia Physical Anesthesia Plan  ASA: III  Anesthesia Plan: General   Post-op Pain Management:    Induction: Intravenous  PONV Risk Score and Plan: 3 and Treatment may vary due to age or medical condition  Airway Management Planned: Mask and Natural Airway  Additional Equipment:   Intra-op Plan:   Post-operative Plan:   Informed Consent: I have reviewed the patients History and Physical, chart, labs and discussed the procedure including the risks, benefits and alternatives for the proposed anesthesia with the patient or authorized representative who has indicated his/her understanding and acceptance.     Dental advisory given  Plan Discussed with: CRNA and Anesthesiologist  Anesthesia Plan Comments:        Anesthesia Quick Evaluation

## 2020-01-08 ENCOUNTER — Ambulatory Visit (HOSPITAL_COMMUNITY): Payer: Medicare Other | Admitting: Anesthesiology

## 2020-01-08 ENCOUNTER — Encounter (HOSPITAL_COMMUNITY)
Admission: RE | Disposition: A | Discharge status: Home / Self Care | Payer: Self-pay | Source: Home / Self Care | Attending: Internal Medicine

## 2020-01-08 ENCOUNTER — Other Ambulatory Visit: Payer: Self-pay

## 2020-01-08 ENCOUNTER — Telehealth (HOSPITAL_COMMUNITY): Payer: Self-pay

## 2020-01-08 ENCOUNTER — Encounter (HOSPITAL_COMMUNITY): Payer: Self-pay | Admitting: Internal Medicine

## 2020-01-08 ENCOUNTER — Ambulatory Visit (HOSPITAL_BASED_OUTPATIENT_CLINIC_OR_DEPARTMENT_OTHER)
Admission: RE | Admit: 2020-01-08 | Discharge: 2020-01-08 | Disposition: A | Discharge status: Home / Self Care | Payer: Medicare Other | Source: Home / Self Care | Attending: Internal Medicine | Admitting: Internal Medicine

## 2020-01-08 DIAGNOSIS — R29810 Facial weakness: Secondary | ICD-10-CM | POA: Diagnosis not present

## 2020-01-08 DIAGNOSIS — I63441 Cerebral infarction due to embolism of right cerebellar artery: Secondary | ICD-10-CM | POA: Diagnosis not present

## 2020-01-08 DIAGNOSIS — Z8673 Personal history of transient ischemic attack (TIA), and cerebral infarction without residual deficits: Secondary | ICD-10-CM | POA: Insufficient documentation

## 2020-01-08 DIAGNOSIS — I4819 Other persistent atrial fibrillation: Secondary | ICD-10-CM | POA: Insufficient documentation

## 2020-01-08 DIAGNOSIS — I10 Essential (primary) hypertension: Secondary | ICD-10-CM | POA: Insufficient documentation

## 2020-01-08 DIAGNOSIS — I639 Cerebral infarction, unspecified: Secondary | ICD-10-CM | POA: Diagnosis not present

## 2020-01-08 DIAGNOSIS — R29818 Other symptoms and signs involving the nervous system: Secondary | ICD-10-CM | POA: Diagnosis not present

## 2020-01-08 DIAGNOSIS — I110 Hypertensive heart disease with heart failure: Secondary | ICD-10-CM | POA: Diagnosis not present

## 2020-01-08 DIAGNOSIS — R519 Headache, unspecified: Secondary | ICD-10-CM | POA: Diagnosis not present

## 2020-01-08 DIAGNOSIS — G8194 Hemiplegia, unspecified affecting left nondominant side: Secondary | ICD-10-CM | POA: Diagnosis not present

## 2020-01-08 DIAGNOSIS — R739 Hyperglycemia, unspecified: Secondary | ICD-10-CM | POA: Diagnosis not present

## 2020-01-08 DIAGNOSIS — I5032 Chronic diastolic (congestive) heart failure: Secondary | ICD-10-CM | POA: Diagnosis not present

## 2020-01-08 DIAGNOSIS — I480 Paroxysmal atrial fibrillation: Secondary | ICD-10-CM | POA: Diagnosis not present

## 2020-01-08 DIAGNOSIS — E785 Hyperlipidemia, unspecified: Secondary | ICD-10-CM | POA: Insufficient documentation

## 2020-01-08 DIAGNOSIS — R42 Dizziness and giddiness: Secondary | ICD-10-CM | POA: Insufficient documentation

## 2020-01-08 DIAGNOSIS — R339 Retention of urine, unspecified: Secondary | ICD-10-CM | POA: Diagnosis not present

## 2020-01-08 DIAGNOSIS — I6523 Occlusion and stenosis of bilateral carotid arteries: Secondary | ICD-10-CM | POA: Diagnosis not present

## 2020-01-08 DIAGNOSIS — R29703 NIHSS score 3: Secondary | ICD-10-CM | POA: Diagnosis not present

## 2020-01-08 DIAGNOSIS — I6601 Occlusion and stenosis of right middle cerebral artery: Secondary | ICD-10-CM | POA: Diagnosis not present

## 2020-01-08 DIAGNOSIS — Z7901 Long term (current) use of anticoagulants: Secondary | ICD-10-CM | POA: Insufficient documentation

## 2020-01-08 DIAGNOSIS — Z79899 Other long term (current) drug therapy: Secondary | ICD-10-CM | POA: Insufficient documentation

## 2020-01-08 DIAGNOSIS — I471 Supraventricular tachycardia: Secondary | ICD-10-CM | POA: Diagnosis not present

## 2020-01-08 DIAGNOSIS — R4781 Slurred speech: Secondary | ICD-10-CM | POA: Diagnosis not present

## 2020-01-08 DIAGNOSIS — Z4659 Encounter for fitting and adjustment of other gastrointestinal appliance and device: Secondary | ICD-10-CM | POA: Diagnosis not present

## 2020-01-08 DIAGNOSIS — Z20822 Contact with and (suspected) exposure to covid-19: Secondary | ICD-10-CM | POA: Diagnosis not present

## 2020-01-08 DIAGNOSIS — I4891 Unspecified atrial fibrillation: Secondary | ICD-10-CM | POA: Diagnosis not present

## 2020-01-08 DIAGNOSIS — I6502 Occlusion and stenosis of left vertebral artery: Secondary | ICD-10-CM | POA: Diagnosis not present

## 2020-01-08 HISTORY — PX: CARDIOVERSION: SHX1299

## 2020-01-08 SURGERY — CARDIOVERSION
Anesthesia: General

## 2020-01-08 MED ORDER — LIDOCAINE HCL (CARDIAC) PF 100 MG/5ML IV SOSY
PREFILLED_SYRINGE | INTRAVENOUS | Status: DC | PRN
  Administered 2020-01-08: 50 mg via INTRAVENOUS

## 2020-01-08 MED ORDER — PROPOFOL 10 MG/ML IV BOLUS
INTRAVENOUS | Status: DC | PRN
  Administered 2020-01-08: 50 mg via INTRAVENOUS

## 2020-01-08 MED ORDER — SODIUM CHLORIDE 0.9 % IV SOLN: INTRAVENOUS | Status: DC | PRN

## 2020-01-08 NOTE — CV Procedure (Signed)
Procedure: Electrical Cardioversion Indications:  Atrial Fibrillation  Procedure Details:  Consent: Risks of procedure as well as the alternatives and risks of each were explained to the (patient/caregiver).  Consent for procedure obtained.  Time Out: Verified patient identification, verified procedure, site/side was marked, verified correct patient position, special equipment/implants available, medications/allergies/relevent history reviewed, required imaging and test results available. PERFORMED.  Patient placed on cardiac monitor, pulse oximetry, supplemental oxygen as necessary.  Sedation given: propofol/lidocaine per anesthesia (Dr. Lowella Dell CRNA) Pacer pads placed anterior and posterior chest.  Cardioverted 2 time(s).  Cardioversion with synchronized biphasic 120J, 150J shock.  Evaluation: Findings: Post procedure EKG shows: NSR Complications: None Patient did tolerate procedure well.  Time Spent Directly with the Patient:  30 minutes   Mary Mercado 01/08/2020, 9:08 AM

## 2020-01-08 NOTE — Telephone Encounter (Signed)
Spoke with the patient's husband. He stated that he would have her here for her test. Asked to call back with any questions. S.Gustabo Gordillo EMTP

## 2020-01-08 NOTE — Transfer of Care (Signed)
Immediate Anesthesia Transfer of Care Note  Patient: Mary Mercado  Procedure(s) Performed: CARDIOVERSION (N/A )  Patient Location: PACU and Endoscopy Unit  Anesthesia Type:General  Level of Consciousness: drowsy  Airway & Oxygen Therapy: Patient Spontanous Breathing  Post-op Assessment: Report given to RN, Post -op Vital signs reviewed and stable and Patient moving all extremities  Post vital signs: Reviewed and stable  Last Vitals:  Vitals Value Taken Time  BP 119/69 01/08/20 0911  Temp    Pulse 63 01/08/20 0912  Resp 19 01/08/20 0912  SpO2 100 % 01/08/20 0912    Last Pain:  Vitals:   01/08/20 0830  TempSrc: Oral  PainSc: 0-No pain         Complications: No complications documented.

## 2020-01-08 NOTE — Discharge Instructions (Signed)
Electrical Cardioversion Electrical cardioversion is the delivery of a jolt of electricity to restore a normal rhythm to the heart. A rhythm that is too fast or is not regular keeps the heart from pumping well. In this procedure, sticky patches or metal paddles are placed on the chest to deliver electricity to the heart from a device. This procedure may be done in an emergency if:  There is low or no blood pressure as a result of the heart rhythm.  Normal rhythm must be restored as fast as possible to protect the brain and heart from further damage.  It may save a life. This may also be a scheduled procedure for irregular or fast heart rhythms that are not immediately life-threatening. Tell a health care provider about:  Any allergies you have.  All medicines you are taking, including vitamins, herbs, eye drops, creams, and over-the-counter medicines.  Any problems you or family members have had with anesthetic medicines.  Any blood disorders you have.  Any surgeries you have had.  Any medical conditions you have.  Whether you are pregnant or may be pregnant. What are the risks? Generally, this is a safe procedure. However, problems may occur, including:  Allergic reactions to medicines.  A blood clot that breaks free and travels to other parts of your body.  The possible return of an abnormal heart rhythm within hours or days after the procedure.  Your heart stopping (cardiac arrest). This is rare. What happens before the procedure? Medicines  Your health care provider may have you start taking: ? Blood-thinning medicines (anticoagulants) so your blood does not clot as easily. ? Medicines to help stabilize your heart rate and rhythm.  Ask your health care provider about: ? Changing or stopping your regular medicines. This is especially important if you are taking diabetes medicines or blood thinners. ? Taking medicines such as aspirin and ibuprofen. These medicines can  thin your blood. Do not take these medicines unless your health care provider tells you to take them. ? Taking over-the-counter medicines, vitamins, herbs, and supplements. General instructions  Follow instructions from your health care provider about eating or drinking restrictions.  Plan to have someone take you home from the hospital or clinic.  If you will be going home right after the procedure, plan to have someone with you for 24 hours.  Ask your health care provider what steps will be taken to help prevent infection. These may include washing your skin with a germ-killing soap. What happens during the procedure?   An IV will be inserted into one of your veins.  Sticky patches (electrodes) or metal paddles may be placed on your chest.  You will be given a medicine to help you relax (sedative).  An electrical shock will be delivered. The procedure may vary among health care providers and hospitals. What can I expect after the procedure?  Your blood pressure, heart rate, breathing rate, and blood oxygen level will be monitored until you leave the hospital or clinic.  Your heart rhythm will be watched to make sure it does not change.  You may have some redness on the skin where the shocks were given. Follow these instructions at home:  Do not drive for 24 hours if you were given a sedative during your procedure.  Take over-the-counter and prescription medicines only as told by your health care provider.  Ask your health care provider how to check your pulse. Check it often.  Rest for 48 hours after the procedure or   as told by your health care provider.  Avoid or limit your caffeine use as told by your health care provider.  Keep all follow-up visits as told by your health care provider. This is important. Contact a health care provider if:  You feel like your heart is beating too quickly or your pulse is not regular.  You have a serious muscle cramp that does not go  away. Get help right away if:  You have discomfort in your chest.  You are dizzy or you feel faint.  You have trouble breathing or you are short of breath.  Your speech is slurred.  You have trouble moving an arm or leg on one side of your body.  Your fingers or toes turn cold or blue. Summary  Electrical cardioversion is the delivery of a jolt of electricity to restore a normal rhythm to the heart.  This procedure may be done right away in an emergency or may be a scheduled procedure if the condition is not an emergency.  Generally, this is a safe procedure.  After the procedure, check your pulse often as told by your health care provider. This information is not intended to replace advice given to you by your health care provider. Make sure you discuss any questions you have with your health care provider. Document Revised: 11/12/2018 Document Reviewed: 11/12/2018 Elsevier Patient Education  2020 [employer]  

## 2020-01-08 NOTE — Anesthesia Postprocedure Evaluation (Signed)
Anesthesia Post Note  Patient: Mary Mercado  Procedure(s) Performed: CARDIOVERSION (N/A )     Patient location during evaluation: PACU Anesthesia Type: General Level of consciousness: awake and alert and oriented Pain management: pain level controlled Vital Signs Assessment: post-procedure vital signs reviewed and stable Respiratory status: spontaneous breathing, nonlabored ventilation and respiratory function stable Cardiovascular status: blood pressure returned to baseline and stable Postop Assessment: no apparent nausea or vomiting Anesthetic complications: no   No complications documented.  Last Vitals:  Vitals:   01/08/20 0911 01/08/20 0912  BP: 119/69   Pulse: 63 63  Resp: (!) 22 19  Temp: 36.8 C   SpO2: 100% 100%    Last Pain:  Vitals:   01/08/20 0911  TempSrc: Oral  PainSc: 0-No pain                 Orlandria Kissner A.

## 2020-01-08 NOTE — Interval H&P Note (Signed)
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  The various methods of treatment have been discussed with the patient and family. After consideration of risks, benefits and other options for treatment, the patient has consented to  Procedure(s): CARDIOVERSION (N/A) as a surgical intervention.  The patient's history has been reviewed, patient examined, no change in status, stable for surgery.  I have reviewed the patient's chart and labs.  Questions were answered to the patient's satisfaction.    No missed doses of Eliquis.   Mary Mercado

## 2020-01-09 ENCOUNTER — Other Ambulatory Visit: Payer: Self-pay

## 2020-01-09 ENCOUNTER — Ambulatory Visit (HOSPITAL_BASED_OUTPATIENT_CLINIC_OR_DEPARTMENT_OTHER): Payer: Medicare Other

## 2020-01-09 ENCOUNTER — Ambulatory Visit (INDEPENDENT_AMBULATORY_CARE_PROVIDER_SITE_OTHER): Payer: Medicare Other | Admitting: Cardiology

## 2020-01-09 ENCOUNTER — Encounter (HOSPITAL_COMMUNITY): Payer: Self-pay | Admitting: Internal Medicine

## 2020-01-09 ENCOUNTER — Inpatient Hospital Stay (HOSPITAL_COMMUNITY)
Admission: EM | Admit: 2020-01-09 | Discharge: 2020-01-14 | DRG: 065 | Disposition: A | Discharge status: Rehabilitation Facility | Payer: Medicare Other | Attending: Internal Medicine | Admitting: Internal Medicine

## 2020-01-09 ENCOUNTER — Emergency Department (HOSPITAL_COMMUNITY): Payer: Medicare Other

## 2020-01-09 ENCOUNTER — Other Ambulatory Visit: Payer: Self-pay | Admitting: Family Medicine

## 2020-01-09 DIAGNOSIS — Z800 Family history of malignant neoplasm of digestive organs: Secondary | ICD-10-CM

## 2020-01-09 DIAGNOSIS — Z4659 Encounter for fitting and adjustment of other gastrointestinal appliance and device: Secondary | ICD-10-CM | POA: Diagnosis not present

## 2020-01-09 DIAGNOSIS — I69392 Facial weakness following cerebral infarction: Secondary | ICD-10-CM | POA: Diagnosis not present

## 2020-01-09 DIAGNOSIS — R4781 Slurred speech: Secondary | ICD-10-CM | POA: Diagnosis present

## 2020-01-09 DIAGNOSIS — Z888 Allergy status to other drugs, medicaments and biological substances status: Secondary | ICD-10-CM

## 2020-01-09 DIAGNOSIS — R7303 Prediabetes: Secondary | ICD-10-CM | POA: Diagnosis present

## 2020-01-09 DIAGNOSIS — G8194 Hemiplegia, unspecified affecting left nondominant side: Secondary | ICD-10-CM | POA: Diagnosis present

## 2020-01-09 DIAGNOSIS — I491 Atrial premature depolarization: Secondary | ICD-10-CM | POA: Diagnosis not present

## 2020-01-09 DIAGNOSIS — R739 Hyperglycemia, unspecified: Secondary | ICD-10-CM | POA: Diagnosis present

## 2020-01-09 DIAGNOSIS — Z20822 Contact with and (suspected) exposure to covid-19: Secondary | ICD-10-CM | POA: Diagnosis present

## 2020-01-09 DIAGNOSIS — Z79899 Other long term (current) drug therapy: Secondary | ICD-10-CM | POA: Diagnosis not present

## 2020-01-09 DIAGNOSIS — Z8249 Family history of ischemic heart disease and other diseases of the circulatory system: Secondary | ICD-10-CM | POA: Diagnosis not present

## 2020-01-09 DIAGNOSIS — E876 Hypokalemia: Secondary | ICD-10-CM | POA: Diagnosis not present

## 2020-01-09 DIAGNOSIS — I10 Essential (primary) hypertension: Secondary | ICD-10-CM | POA: Diagnosis not present

## 2020-01-09 DIAGNOSIS — R270 Ataxia, unspecified: Secondary | ICD-10-CM | POA: Diagnosis present

## 2020-01-09 DIAGNOSIS — I2510 Atherosclerotic heart disease of native coronary artery without angina pectoris: Secondary | ICD-10-CM | POA: Diagnosis not present

## 2020-01-09 DIAGNOSIS — I639 Cerebral infarction, unspecified: Secondary | ICD-10-CM

## 2020-01-09 DIAGNOSIS — I480 Paroxysmal atrial fibrillation: Secondary | ICD-10-CM

## 2020-01-09 DIAGNOSIS — I6502 Occlusion and stenosis of left vertebral artery: Secondary | ICD-10-CM | POA: Diagnosis not present

## 2020-01-09 DIAGNOSIS — R29703 NIHSS score 3: Secondary | ICD-10-CM | POA: Diagnosis present

## 2020-01-09 DIAGNOSIS — E785 Hyperlipidemia, unspecified: Secondary | ICD-10-CM | POA: Diagnosis present

## 2020-01-09 DIAGNOSIS — I4891 Unspecified atrial fibrillation: Secondary | ICD-10-CM

## 2020-01-09 DIAGNOSIS — R29810 Facial weakness: Secondary | ICD-10-CM | POA: Diagnosis present

## 2020-01-09 DIAGNOSIS — Z8673 Personal history of transient ischemic attack (TIA), and cerebral infarction without residual deficits: Secondary | ICD-10-CM | POA: Diagnosis not present

## 2020-01-09 DIAGNOSIS — I6523 Occlusion and stenosis of bilateral carotid arteries: Secondary | ICD-10-CM | POA: Diagnosis not present

## 2020-01-09 DIAGNOSIS — I6601 Occlusion and stenosis of right middle cerebral artery: Secondary | ICD-10-CM | POA: Diagnosis not present

## 2020-01-09 DIAGNOSIS — Z886 Allergy status to analgesic agent status: Secondary | ICD-10-CM | POA: Diagnosis not present

## 2020-01-09 DIAGNOSIS — R519 Headache, unspecified: Secondary | ICD-10-CM | POA: Diagnosis not present

## 2020-01-09 DIAGNOSIS — R1310 Dysphagia, unspecified: Secondary | ICD-10-CM | POA: Diagnosis not present

## 2020-01-09 DIAGNOSIS — R52 Pain, unspecified: Secondary | ICD-10-CM | POA: Diagnosis not present

## 2020-01-09 DIAGNOSIS — Z7901 Long term (current) use of anticoagulants: Secondary | ICD-10-CM

## 2020-01-09 DIAGNOSIS — Z833 Family history of diabetes mellitus: Secondary | ICD-10-CM

## 2020-01-09 DIAGNOSIS — Z0189 Encounter for other specified special examinations: Secondary | ICD-10-CM

## 2020-01-09 DIAGNOSIS — R2689 Other abnormalities of gait and mobility: Secondary | ICD-10-CM | POA: Diagnosis not present

## 2020-01-09 DIAGNOSIS — I63441 Cerebral infarction due to embolism of right cerebellar artery: Principal | ICD-10-CM | POA: Diagnosis present

## 2020-01-09 DIAGNOSIS — R55 Syncope and collapse: Secondary | ICD-10-CM | POA: Diagnosis not present

## 2020-01-09 DIAGNOSIS — R278 Other lack of coordination: Secondary | ICD-10-CM | POA: Diagnosis present

## 2020-01-09 DIAGNOSIS — I110 Hypertensive heart disease with heart failure: Secondary | ICD-10-CM | POA: Diagnosis present

## 2020-01-09 DIAGNOSIS — I358 Other nonrheumatic aortic valve disorders: Secondary | ICD-10-CM | POA: Diagnosis present

## 2020-01-09 DIAGNOSIS — R339 Retention of urine, unspecified: Secondary | ICD-10-CM | POA: Diagnosis present

## 2020-01-09 DIAGNOSIS — I5032 Chronic diastolic (congestive) heart failure: Secondary | ICD-10-CM | POA: Diagnosis present

## 2020-01-09 DIAGNOSIS — I361 Nonrheumatic tricuspid (valve) insufficiency: Secondary | ICD-10-CM | POA: Diagnosis not present

## 2020-01-09 DIAGNOSIS — I69311 Memory deficit following cerebral infarction: Secondary | ICD-10-CM | POA: Diagnosis not present

## 2020-01-09 DIAGNOSIS — I340 Nonrheumatic mitral (valve) insufficiency: Secondary | ICD-10-CM | POA: Diagnosis not present

## 2020-01-09 DIAGNOSIS — I69322 Dysarthria following cerebral infarction: Secondary | ICD-10-CM | POA: Diagnosis not present

## 2020-01-09 DIAGNOSIS — G4489 Other headache syndrome: Secondary | ICD-10-CM | POA: Diagnosis not present

## 2020-01-09 DIAGNOSIS — R29818 Other symptoms and signs involving the nervous system: Secondary | ICD-10-CM | POA: Diagnosis not present

## 2020-01-09 DIAGNOSIS — R42 Dizziness and giddiness: Secondary | ICD-10-CM | POA: Diagnosis not present

## 2020-01-09 LAB — COMPREHENSIVE METABOLIC PANEL
ALT: 29 U/L (ref 0–44)
AST: 35 U/L (ref 15–41)
Albumin: 3.9 g/dL (ref 3.5–5.0)
Alkaline Phosphatase: 79 U/L (ref 38–126)
Anion gap: 11 (ref 5–15)
BUN: 7 mg/dL — ABNORMAL LOW (ref 8–23)
CO2: 24 mmol/L (ref 22–32)
Calcium: 8.8 mg/dL — ABNORMAL LOW (ref 8.9–10.3)
Chloride: 101 mmol/L (ref 98–111)
Creatinine, Ser: 0.76 mg/dL (ref 0.44–1.00)
GFR calc Af Amer: >60 mL/min (ref >60)
GFR calc non Af Amer: >60 mL/min (ref >60)
Glucose, Bld: 209 mg/dL — ABNORMAL HIGH (ref 70–99)
Potassium: 3.5 mmol/L (ref 3.5–5.1)
Sodium: 136 mmol/L (ref 135–145)
Total Bilirubin: 1 mg/dL (ref 0.3–1.2)
Total Protein: 6.4 g/dL — ABNORMAL LOW (ref 6.5–8.1)

## 2020-01-09 LAB — I-STAT CHEM 8, ED
BUN: 9 mg/dL (ref 8–23)
Calcium, Ion: 1.1 mmol/L — ABNORMAL LOW (ref 1.15–1.40)
Chloride: 103 mmol/L (ref 98–111)
Creatinine, Ser: 0.6 mg/dL (ref 0.44–1.00)
Glucose, Bld: 168 mg/dL — ABNORMAL HIGH (ref 70–99)
HCT: 41 % (ref 36.0–46.0)
Hemoglobin: 13.9 g/dL (ref 12.0–15.0)
Potassium: 3.6 mmol/L (ref 3.5–5.1)
Sodium: 137 mmol/L (ref 135–145)
TCO2: 24 mmol/L (ref 22–32)

## 2020-01-09 LAB — DIFFERENTIAL
Abs Immature Granulocytes: 0.01 10*3/uL (ref 0.00–0.07)
Basophils Absolute: 0.1 10*3/uL (ref 0.0–0.1)
Basophils Relative: 1 %
Eosinophils Absolute: 0.1 10*3/uL (ref 0.0–0.5)
Eosinophils Relative: 1 %
Immature Granulocytes: 0 %
Lymphocytes Relative: 27 %
Lymphs Abs: 2.1 10*3/uL (ref 0.7–4.0)
Monocytes Absolute: 0.6 10*3/uL (ref 0.1–1.0)
Monocytes Relative: 8 %
Neutro Abs: 4.9 10*3/uL (ref 1.7–7.7)
Neutrophils Relative %: 63 %

## 2020-01-09 LAB — CBC
HCT: 42.1 % (ref 36.0–46.0)
Hemoglobin: 13.5 g/dL (ref 12.0–15.0)
MCH: 29.7 pg (ref 26.0–34.0)
MCHC: 32.1 g/dL (ref 30.0–36.0)
MCV: 92.5 fL (ref 80.0–100.0)
Platelets: 262 10*3/uL (ref 150–400)
RBC: 4.55 MIL/uL (ref 3.87–5.11)
RDW: 13.1 % (ref 11.5–15.5)
WBC: 7.7 10*3/uL (ref 4.0–10.5)
nRBC: 0 % (ref 0.0–0.2)

## 2020-01-09 LAB — RAPID URINE DRUG SCREEN, HOSP PERFORMED
Amphetamines: NOT DETECTED
Barbiturates: NOT DETECTED
Benzodiazepines: NOT DETECTED
Cocaine: NOT DETECTED
Opiates: NOT DETECTED
Tetrahydrocannabinol: NOT DETECTED

## 2020-01-09 LAB — URINALYSIS, ROUTINE W REFLEX MICROSCOPIC
Bacteria, UA: NONE SEEN
Bilirubin Urine: NEGATIVE
Glucose, UA: >=500 mg/dL — AB
Hgb urine dipstick: NEGATIVE
Ketones, ur: 20 mg/dL — AB
Leukocytes,Ua: NEGATIVE
Nitrite: NEGATIVE
Protein, ur: NEGATIVE mg/dL
Specific Gravity, Urine: 1.027 (ref 1.005–1.030)
pH: 6 (ref 5.0–8.0)

## 2020-01-09 LAB — MYOCARDIAL PERFUSION IMAGING: SRS: 3

## 2020-01-09 LAB — APTT: aPTT: 27 seconds (ref 24–36)

## 2020-01-09 LAB — ETHANOL: Alcohol, Ethyl (B): <10 mg/dL (ref <10)

## 2020-01-09 LAB — PROTIME-INR
INR: 1.3 — ABNORMAL HIGH (ref 0.8–1.2)
Prothrombin Time: 15.2 seconds (ref 11.4–15.2)

## 2020-01-09 LAB — CBG MONITORING, ED: Glucose-Capillary: 176 mg/dL — ABNORMAL HIGH (ref 70–99)

## 2020-01-09 LAB — SARS CORONAVIRUS 2 BY RT PCR (HOSPITAL ORDER, PERFORMED IN ~~LOC~~ HOSPITAL LAB): SARS Coronavirus 2: NEGATIVE

## 2020-01-09 MED ORDER — FLECAINIDE ACETATE 50 MG PO TABS
50.0000 mg | ORAL_TABLET | Freq: Two times a day (BID) | ORAL | Status: DC
  Filled 2020-01-09 (×2): qty 1

## 2020-01-09 MED ORDER — ACETAMINOPHEN 650 MG RE SUPP: 650.0000 mg | RECTAL | Status: DC | PRN

## 2020-01-09 MED ORDER — PROCHLORPERAZINE EDISYLATE 10 MG/2ML IJ SOLN
10.0000 mg | Freq: Four times a day (QID) | INTRAMUSCULAR | Status: DC | PRN
  Administered 2020-01-09 – 2020-01-11 (×2): 10 mg via INTRAVENOUS
  Filled 2020-01-09 (×2): qty 2

## 2020-01-09 MED ORDER — FAMOTIDINE-CA CARB-MAG HYDROX 10-800-165 MG PO CHEW: 1.0000 | CHEWABLE_TABLET | Freq: Every day | ORAL | Status: DC | PRN

## 2020-01-09 MED ORDER — ATORVASTATIN CALCIUM 40 MG PO TABS: 40.0000 mg | ORAL_TABLET | Freq: Every day | ORAL | Status: DC

## 2020-01-09 MED ORDER — HYDRALAZINE HCL 25 MG PO TABS: 25.0000 mg | ORAL_TABLET | Freq: Four times a day (QID) | ORAL | Status: DC | PRN

## 2020-01-09 MED ORDER — ACETAMINOPHEN 325 MG PO TABS: 650.0000 mg | ORAL_TABLET | ORAL | Status: DC | PRN

## 2020-01-09 MED ORDER — STROKE: EARLY STAGES OF RECOVERY BOOK
Freq: Once | Status: AC
  Filled 2020-01-09: qty 1

## 2020-01-09 MED ORDER — ASPIRIN EC 81 MG PO TBEC: 81.0000 mg | DELAYED_RELEASE_TABLET | Freq: Every day | ORAL | Status: DC

## 2020-01-09 MED ORDER — TECHNETIUM TC 99M TETROFOSMIN IV KIT
32.0000 | PACK | Freq: Once | INTRAVENOUS | Status: AC | PRN
  Filled 2020-01-09: qty 32

## 2020-01-09 MED ORDER — MECLIZINE HCL 12.5 MG PO TABS
12.5000 mg | ORAL_TABLET | Freq: Three times a day (TID) | ORAL | Status: DC | PRN
  Administered 2020-01-11: 12.5 mg via ORAL
  Filled 2020-01-09: qty 1

## 2020-01-09 MED ORDER — ONDANSETRON HCL 4 MG/2ML IJ SOLN: 4.0000 mg | Freq: Four times a day (QID) | INTRAMUSCULAR | Status: DC | PRN

## 2020-01-09 MED ORDER — SENNOSIDES-DOCUSATE SODIUM 8.6-50 MG PO TABS: 1.0000 | ORAL_TABLET | Freq: Every evening | ORAL | Status: DC | PRN

## 2020-01-09 MED ORDER — ONDANSETRON HCL 4 MG/2ML IJ SOLN
INTRAMUSCULAR | Status: AC
  Administered 2020-01-09: 2 mg via INTRAVENOUS
  Filled 2020-01-09: qty 2

## 2020-01-09 MED ORDER — DILTIAZEM HCL 60 MG PO TABS
60.0000 mg | ORAL_TABLET | Freq: Three times a day (TID) | ORAL | Status: DC
  Filled 2020-01-09 (×3): qty 1

## 2020-01-09 MED ORDER — FLECAINIDE ACETATE 50 MG PO TABS
50.0000 mg | ORAL_TABLET | Freq: Two times a day (BID) | ORAL | Status: DC
  Filled 2020-01-09: qty 1

## 2020-01-09 MED ORDER — TECHNETIUM TC 99M TETROFOSMIN IV KIT
9.9000 | PACK | Freq: Once | INTRAVENOUS | Status: AC | PRN
  Administered 2020-01-09: 9.9 via INTRAVENOUS
  Filled 2020-01-09: qty 10

## 2020-01-09 MED ORDER — CALCIUM CARBONATE ANTACID 500 MG PO CHEW: 800.0000 mg | CHEWABLE_TABLET | Freq: Every day | ORAL | Status: DC | PRN

## 2020-01-09 MED ORDER — REGADENOSON 0.4 MG/5ML IV SOLN: 0.4000 mg | Freq: Once | INTRAVENOUS | Status: AC

## 2020-01-09 MED ORDER — ACETAMINOPHEN 160 MG/5ML PO SOLN: 650.0000 mg | ORAL | Status: DC | PRN

## 2020-01-09 MED ORDER — IOHEXOL 350 MG/ML SOLN
75.0000 mL | Freq: Once | INTRAVENOUS | Status: AC | PRN
  Administered 2020-01-09: 75 mL via INTRAVENOUS

## 2020-01-09 MED ORDER — FAMOTIDINE 20 MG PO TABS: 10.0000 mg | ORAL_TABLET | Freq: Every day | ORAL | Status: DC | PRN

## 2020-01-09 MED ORDER — APIXABAN 5 MG PO TABS: 5.0000 mg | ORAL_TABLET | Freq: Two times a day (BID) | ORAL | Status: DC

## 2020-01-09 MED ORDER — PROCHLORPERAZINE EDISYLATE 10 MG/2ML IJ SOLN
10.0000 mg | Freq: Once | INTRAMUSCULAR | Status: AC
  Administered 2020-01-09: 10 mg via INTRAVENOUS
  Filled 2020-01-09: qty 2

## 2020-01-09 MED ORDER — DILTIAZEM HCL 60 MG PO TABS
60.0000 mg | ORAL_TABLET | Freq: Three times a day (TID) | ORAL | Status: DC
  Filled 2020-01-09 (×2): qty 1

## 2020-01-09 MED ORDER — SODIUM CHLORIDE 0.9 % IV SOLN: INTRAVENOUS | Status: AC

## 2020-01-09 NOTE — Progress Notes (Addendum)
HOSPITAL MEDICINE OVERNIGHT EVENT NOTE    Notified by nursing patient is complaining of nausea, regardless of position.   Possibly secondary to new stroke.  Will try trial of Compazine and monitor for symptomatic improvement.   Vernelle Emerald  MD [facility]   ADDENDUM  Notified by nursing patient has failed her swallow screen.   SLP eval ordered for the AM, NPO until then.    Discontinued Eliquis.  Dicussed case with Dr. Lorraine Lax with neurology who recommends holding off on anticoagulation for now anyway unless there is evidence of thrombus on Echo in am.  Will additionally place NGT so that remainder of meds including Flecanide and Cardizem can be resumed.   [place]   Notified by nursing patient passed a repeat swallow screen.  Considering patient's tenuous swallow function I think we should still proceed with NGT placement and administration of medications via NGT until swallow eval tomorrow.  Sherryll Burger Evola Hollis

## 2020-01-09 NOTE — Consult Note (Signed)
Neurology Consultation Reason for Consult: Slurred speech Referring Physician: Tegeler, C  CC: Slurred speech  History is obtained from: Patient, husband  HPI: Mary Mercado is a 79 y.o. female with a history of previous stroke with some left-sided deficits who presents with acute slurred speech facial droop.  She was in her normal state of health when she went to her doctor's appointment, she received a medication, though I am not sure which one, to do a stress test and then spent several minutes in front of a screen.  Was after she left and from the screen and went to the bathroom that she developed acute unsteadiness and nausea and slurred speech.  She was brought in as a code stroke.  She was not a candidate for IV TPA due to being fully anticoagulated with Eliquis, last dose this morning, and an emergent CTA was done which did not demonstrate any large vessel occlusion.   LKW: 12:15 PM tpa given?: no, anticoagulated    ROS: A 14 point ROS was performed and is negative except as noted in the HPI.  Past Medical History:  Diagnosis Date  . Allergy   . CVA (cerebral vascular accident) ([place])   . Hyperlipidemia LDL goal <70   . Hypertension   . Palpitations   . Vertigo, benign positional      Family History  Problem Relation Age of Onset  . Colon cancer Father   . Diabetes Mother   . Heart attack Mother        68  . Heart attack Brother 33     Social History:  reports that she has never smoked. She has never used smokeless tobacco. She reports that she does not drink alcohol and does not use drugs.   Exam: Current vital signs: BP (!) 165/90 (BP Location: Right Arm)   Pulse 73   Temp 98 F (36.7 C) (Oral)   Resp 20   SpO2 95%  Vital signs in last 24 hours: Temp:  [98 F (36.7 C)] 98 F (36.7 C) (09/16 1315) Pulse Rate:  [73] 73 (09/16 1315) Resp:  [20] 20 (09/16 1315) BP: (165)/(90) 165/90 (09/16 1315) SpO2:  [95 %] 95 % (09/16 1315)   Physical  Exam  Constitutional: Appears well-developed and well-nourished.  Psych: Affect appropriate to situation Eyes: No scleral injection HENT: No OP obstrucion MSK: no joint deformities.  Cardiovascular: Normal rate and regular rhythm.  Respiratory: Effort normal, non-labored breathing GI: Soft.  No distension. There is no tenderness.  Skin: WDI  Neuro: Mental Status: Patient is awake, alert, oriented to person, place, month, year, and situation. Patient is able to give a clear and coherent history. No signs of aphasia or neglect Cranial Nerves: II: Visual Fields are full. Pupils are equal, round, and reactive to light.   III,IV, VI: EOMI without ptosis or diploplia.  No nystagmus V: Facial sensation is symmetric to temperature VII: Facial movement with?  Mild right facial weakness, though I think this is more positional dependent. VIII: hearing is intact to voice X: Uvula elevates symmetrically XI: Shoulder shrug is symmetric. XII: tongue is midline without atrophy or fasciculations.  Motor: Tone is normal. Bulk is normal. 5/5 strength was present on the right, she has mild drift in the left arm, she suspects this is chronic due to her old strokes. Sensory: Sensation is diminished throughout the left side, due to her old stroke Cerebellar: She has mild difficulty with finger-nose-finger bilaterally, slightly worse on the left than the  right    I have reviewed labs in epic and the results pertinent to this consultation are: Glucose 168, creatinine 0.6  I have reviewed the images obtained: CT/CTA-negative  Impression: 79 year old female with acute onset dysarthria, unsteadiness, nausea and vomiting with negative CT imaging.  Her symptoms are most consistent with a small cerebellar or brainstem infarct.  She will need to be admitted for secondary risk factor modification and physical therapy.  Recommendations: - HgbA1c, fasting lipid panel - MRI of the brain without contrast -  Frequent neuro checks - Echocardiogram - Prophylactic therapy-depending on size of stroke, could consider anticoagulation versus antiplatelet therapy for a while. - Risk factor modification - Telemetry monitoring - PT consult, OT consult, Speech consult - Stroke team to follow  Roland Rack, MD [facility] [phone]  If 7pm- 7am, please page neurology on call as listed in [place].

## 2020-01-09 NOTE — H&P (Signed)
History and Physical    Drena Ham YTK:[id number] DOB: February 09, 1941 DOA: 01/09/2020  PCP: [name], MD (Confirm with patient/family/NH records and if not entered, this has to be entered at [facility] point of entry) Patient coming from: Home  I have personally briefly reviewed patient's old medical records in [place]  Chief Complaint: Vertigo  HPI: Mary Mercado is a 79 y.o. female with medical history significant of CAD in 2020, paroxysmal A. fib on Eliquis, hypertension, HLD, presented with new onset of vertigo and ataxia.  Was at her cardiologist office this morning for pharmacological stress test, and suddenly developed ataxia, slurred speech and a feeling of vertigo.  Patient has been taking Eliquis 5 mg twice daily, and never missed a dose.  Patient did not complain specifically one-sided weakness or numbness, no vision changes or headache no hearing changes.  Patient was feeling nausea on arrival in the ED which subsided.  Patient also described that " whole body was spinning around me" and has to close her eyes to ease these symptoms. 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  Moderate stenosis of the right P2 segment.  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    Past Medical History:  Diagnosis Date  . Allergy   . CVA (cerebral vascular accident) ([place])   . Hyperlipidemia LDL goal <70   . Hypertension   . Palpitations   . Vertigo, benign positional     Past Surgical History:  Procedure Laterality Date  . CARDIOVERSION Left 10/31/2018   Procedure: CARDIOVERSION;  Surgeon: Acie Fredrickson Wonda Cheng, MD;  Location: [facility];  Service: Cardiovascular;  Laterality: Left;  . CARDIOVERSION N/A 01/08/2020   Procedure: CARDIOVERSION;   Surgeon: [name], MD;  Location: [facility] ENDOSCOPY;  Service: Cardiovascular;  Laterality: N/A;  . Hemilaminectomy and microdiskectomy at L4-5 on the left.  12/10/2003  . TEE WITHOUT CARDIOVERSION N/A 06/05/2018   Procedure: TRANSESOPHAGEAL ECHOCARDIOGRAM (TEE);  Surgeon: Lelon Perla, MD;  Location: [facility] ENDOSCOPY;  Service: Cardiovascular;  Laterality: N/A;  loop     reports that she has never smoked. She has never used smokeless tobacco. She reports that she does not drink alcohol and does not use drugs.  Allergies  Allergen Reactions  . Duloxetine     Causes Bp elevation.  Increased body jerks, nausea  . Acetaminophen Hives and Rash    Family History  Problem Relation Age of Onset  . Colon cancer Father   . Diabetes Mother   . Heart attack Mother        29  . Heart attack Brother 27     Prior to Admission medications   Medication Sig Start Date End Date Taking? Authorizing Provider  ELIQUIS 5 MG TABS tablet TAKE 1 TABLET BY MOUTH TWICE A DAY Patient taking differently: Take 5 mg by mouth 2 (two) times daily.  04/23/19  Yes Nahser, Wonda Cheng, MD  atorvastatin (LIPITOR) 40 MG tablet Take 1 tablet (40 mg total) by mouth daily at 6 PM. 01/09/20   [name], MD  diltiazem (CARDIZEM CD) 180 MG 24 hr capsule TAKE 1 CAPSULE BY MOUTH EVERY DAY Patient taking differently: Take 180 mg by mouth daily.  01/02/20   Nahser, Wonda Cheng, MD  diltiazem (  CARDIZEM) 30 MG tablet Take 30 mg by mouth 4 (four) times daily as needed (afib).     [provider]  famotidine-calcium carbonate-magnesium hydroxide (PEPCID COMPLETE) 10-800-165 MG chewable tablet Chew 1 tablet by mouth daily as needed (heartburn).    [provider]  flecainide (TAMBOCOR) 50 MG tablet Take 1 tablet (50 mg total) by mouth 2 (two) times daily. 01/02/20   Nahser, Wonda Cheng, MD  telmisartan (MICARDIS) 20 MG tablet TAKE 1 TABLET BY MOUTH EVERY DAY Patient taking differently: Take 10 mg by mouth daily.   11/05/19   Nahser, Wonda Cheng, MD    Physical Exam: Vitals:   01/09/20 1400 01/09/20 1415 01/09/20 1703 01/09/20 1815  BP: (!) 153/87 (!) 166/79 (!) 150/66 (!) 162/97  Pulse: 63 75 84 84  Resp: 18 20 15  (!) 25  Temp:   (!) 97.4 F (36.3 C)   TempSrc:   Oral   SpO2: 96% 97% 95% 92%    Constitutional: NAD, calm, comfortable Vitals:   01/09/20 1400 01/09/20 1415 01/09/20 1703 01/09/20 1815  BP: (!) 153/87 (!) 166/79 (!) 150/66 (!) 162/97  Pulse: 63 75 84 84  Resp: 18 20 15  (!) 25  Temp:   (!) 97.4 F (36.3 C)   TempSrc:   Oral   SpO2: 96% 97% 95% 92%   Eyes: PERRL, lids and conjunctivae normal ENMT: Mucous membranes are moist. Posterior pharynx clear of any exudate or lesions.Normal dentition.  Neck: normal, supple, no masses, no thyromegaly Respiratory: clear to auscultation bilaterally, no wheezing, no crackles. Normal respiratory effort. No accessory muscle use.  Cardiovascular: Regular rate and rhythm, no murmurs / rubs / gallops. No extremity edema. 2+ pedal pulses. No carotid bruits.  Abdomen: no tenderness, no masses palpated. No hepatosplenomegaly. Bowel sounds positive.  Musculoskeletal: no clubbing / cyanosis. No joint deformity upper and lower extremities. Good ROM, no contractures. Normal muscle tone.  Skin: no rashes, lesions, ulcers. No induration Neurologic: CN 2-12 grossly intact. Sensation intact, DTR normal. Strength 5/5 in all 4.  Bilateral finger-to-nose is somewhat slow bilaterally Psychiatric: Normal judgment and insight. Alert and oriented x 3. Normal mood.     Labs on Admission: I have personally reviewed following labs and imaging studies  CBC: Recent Labs  Lab 01/09/20 1248 01/09/20 1301  WBC 7.7  --   NEUTROABS 4.9  --   HGB 13.5 13.9  HCT 42.1 41.0  MCV 92.5  --   PLT 262  --    Basic Metabolic Panel: Recent Labs  Lab 01/09/20 1301 01/09/20 1325  NA 137 136  K 3.6 3.5  CL 103 101  CO2  --  24  GLUCOSE 168* 209*  BUN 9 7*    CREATININE 0.60 0.76  CALCIUM  --  8.8*   GFR: Estimated Creatinine Clearance: 50.5 mL/min (by C-G formula based on SCr of 0.76 mg/dL). Liver Function Tests: Recent Labs  Lab 01/09/20 1325  AST 35  ALT 29  ALKPHOS 79  BILITOT 1.0  PROT 6.4*  ALBUMIN 3.9   No results for input(s): LIPASE, AMYLASE in the last 168 hours. No results for input(s): AMMONIA in the last 168 hours. Coagulation Profile: Recent Labs  Lab 01/09/20 1248  INR 1.3*   Cardiac Enzymes: No results for input(s): CKTOTAL, CKMB, CKMBINDEX, TROPONINI in the last 168 hours. BNP (last 3 results) No results for input(s): PROBNP in the last 8760 hours. HbA1C: No results for input(s): HGBA1C in the last 72 hours. CBG: Recent Labs  Lab 01/09/20 1241  GLUCAP 176*   Lipid Profile: No results for input(s): CHOL, HDL, LDLCALC, TRIG, CHOLHDL, LDLDIRECT in the last 72 hours. Thyroid Function Tests: No results for input(s): TSH, T4TOTAL, FREET4, T3FREE, THYROIDAB in the last 72 hours. Anemia Panel: No results for input(s): VITAMINB12, FOLATE, FERRITIN, TIBC, IRON, RETICCTPCT in the last 72 hours. Urine analysis:    Component Value Date/Time   COLORURINE YELLOW 11/14/2019 1102   APPEARANCEUR CLEAR 11/14/2019 1102   LABSPEC 1.009 11/14/2019 1102   PHURINE < OR = 5.0 11/14/2019 1102   GLUCOSEU NEGATIVE 11/14/2019 1102   HGBUR NEGATIVE 11/14/2019 1102   BILIRUBINUR NEGATIVE 05/31/2018 1445   KETONESUR NEGATIVE 11/14/2019 1102   PROTEINUR NEGATIVE 11/14/2019 1102   UROBILINOGEN 0.2 02/01/2011 1044   NITRITE NEGATIVE 11/14/2019 1102   LEUKOCYTESUR 1+ (A) 11/14/2019 1102    Radiological Exams on Admission: CT Code Stroke CTA Head W/WO contrast  Result Date: 01/09/2020 CLINICAL DATA:  Focal neuro deficit.  Dizziness. EXAM: CT ANGIOGRAPHY HEAD AND NECK TECHNIQUE: Multidetector CT imaging of the head and neck was performed using the standard protocol during bolus administration of intravenous contrast. Multiplanar  CT image reconstructions and MIPs were obtained to evaluate the vascular anatomy. Carotid stenosis measurements (when applicable) are obtained utilizing NASCET criteria, using the distal internal carotid diameter as the denominator. CONTRAST:  44mL OMNIPAQUE IOHEXOL 350 MG/ML SOLN 75 mL Omnipaque 350 IV COMPARISON:  CT head 01/09/2020 FINDINGS: CTA NECK FINDINGS Aortic arch: Atherosclerotic calcification aortic arch. Atherosclerotic calcification in the great vessels especially left subclavian artery without significant stenosis. Right carotid system: Atherosclerotic disease at the right carotid bifurcation. 25% diameter stenosis proximal right internal carotid artery. Left carotid system: Atherosclerotic calcification left carotid bifurcation without stenosis. Vertebral arteries: Right vertebral artery dominant and without stenosis. Moderate to severe stenosis at the origin of the left vertebral artery. Left vertebral artery is then patent to the basilar without additional stenosis. Skeleton: No acute abnormality. Other neck: Negative for mass or adenopathy. Upper chest: Lung apices clear bilaterally. Review of the MIP images confirms the above findings CTA HEAD FINDINGS Anterior circulation: Atherosclerotic calcification throughout the cavernous carotid bilaterally with mild to moderate stenosis bilaterally. Anterior and middle cerebral arteries patent bilaterally without large vessel occlusion or flow limiting stenosis. Posterior circulation: Both vertebral arteries patent to the basilar. PICA patent bilaterally. Basilar widely patent. Mild atherosclerotic irregularity in the basilar. AICA, superior cerebellar, and posterior cerebral arteries patent bilaterally without large vessel occlusion. Moderate stenosis right P2 segment. Left posterior cerebral artery patent without stenosis. Venous sinuses: Normal venous enhancement. Anatomic variants: None Review of the MIP images confirms the above findings IMPRESSION:  1. Negative for intracranial large vessel occlusion 2. 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 Mild to moderate atherosclerotic stenosis in the supraclinoid internal carotid artery bilaterally. 3. 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 Right vertebral artery widely patent 4. Moderate stenosis right P2 segment. 5. These results were called by telephone at the time of interpretation on 01/09/2020 at 1:15 pm to provider MCNEILL [facility] , who verbally acknowledged these results. Electronically Signed   By: Franchot Gallo M.D.   On: 01/09/2020 13:16   CT Code Stroke CTA Neck W/WO contrast  Result Date: 01/09/2020 CLINICAL DATA:  Focal neuro deficit.  Dizziness. EXAM: CT ANGIOGRAPHY HEAD AND NECK TECHNIQUE: Multidetector CT imaging of the head and neck was performed using the standard protocol during bolus administration of intravenous contrast. Multiplanar CT image reconstructions and  MIPs were obtained to evaluate the vascular anatomy. Carotid stenosis measurements (when applicable) are obtained utilizing NASCET criteria, using the distal internal carotid diameter as the denominator. CONTRAST:  7mL OMNIPAQUE IOHEXOL 350 MG/ML SOLN 75 mL Omnipaque 350 IV COMPARISON:  CT head 01/09/2020 FINDINGS: CTA NECK FINDINGS Aortic arch: Atherosclerotic calcification aortic arch. Atherosclerotic calcification in the great vessels especially left subclavian artery without significant stenosis. Right carotid system: Atherosclerotic disease at the right carotid bifurcation. 25% diameter stenosis proximal right internal carotid artery. Left carotid system: Atherosclerotic calcification left carotid bifurcation without stenosis. Vertebral arteries: Right vertebral artery dominant and without stenosis. Moderate to severe stenosis at the origin of the left vertebral artery. Left vertebral artery is then patent to the basilar without additional stenosis.  Skeleton: No acute abnormality. Other neck: Negative for mass or adenopathy. Upper chest: Lung apices clear bilaterally. Review of the MIP images confirms the above findings CTA HEAD FINDINGS Anterior circulation: Atherosclerotic calcification throughout the cavernous carotid bilaterally with mild to moderate stenosis bilaterally. Anterior and middle cerebral arteries patent bilaterally without large vessel occlusion or flow limiting stenosis. Posterior circulation: Both vertebral arteries patent to the basilar. PICA patent bilaterally. Basilar widely patent. Mild atherosclerotic irregularity in the basilar. AICA, superior cerebellar, and posterior cerebral arteries patent bilaterally without large vessel occlusion. Moderate stenosis right P2 segment. Left posterior cerebral artery patent without stenosis. Venous sinuses: Normal venous enhancement. Anatomic variants: None Review of the MIP images confirms the above findings IMPRESSION: 1. Negative for intracranial large vessel occlusion 2. 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 Mild to moderate atherosclerotic stenosis in the supraclinoid internal carotid artery bilaterally. 3. 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 Right vertebral artery widely patent 4. Moderate stenosis right P2 segment. 5. These results were called by telephone at the time of interpretation on 01/09/2020 at 1:15 pm to provider MCNEILL [facility] , who verbally acknowledged these results. Electronically Signed   By: Franchot Gallo M.D.   On: 01/09/2020 13:16   MR BRAIN WO CONTRAST  Result Date: 01/09/2020 CLINICAL DATA:  79 year old female code stroke presentation this morning. Headache and vomiting. EXAM: MRI HEAD WITHOUT CONTRAST TECHNIQUE: Multiplanar, multiecho pulse sequences of the brain and surrounding structures were obtained without intravenous contrast. COMPARISON:  CT head and CTA head and neck earlier  today. Brain MRI 06/01/2018. FINDINGS: Brain: Patchy 3 cm area of restricted diffusion in the right superior cerebellar artery territory (series 3, image 14). Minimal T2 and FLAIR hyperintensity. No associated hemorrhage or mass effect. Superimposed chronic microhemorrhage elsewhere in the right cerebellar hemisphere. No other convincing posterior fossa restricted diffusion. Expected evolution of the posterior right frontal lobe/posterior operculum infarct last year with encephalomalacia there now. No supratentorial restricted diffusion today. Elsewhere stable gray and white matter signal including Patchy and confluent bilateral cerebral white matter and pontine T2 and FLAIR hyperintensity.-no other chronic cerebral blood products. No midline shift, mass effect, evidence of mass lesion, ventriculomegaly, extra-axial collection or acute intracranial hemorrhage. Cervicomedullary junction and pituitary are within normal limits. Vascular: Major intracranial vascular flow voids are stable since last year. Skull and upper cervical spine: Negative for age visible cervical spine. Visualized bone marrow signal is within normal limits. Sinuses/Orbits: Stable, negative. Other: Mastoids remain clear. Grossly stable and normal visible internal auditory structures. Visible scalp and face appear negative. IMPRESSION: 1. Acute 3 cm infarct in the Right Superior Cerebellar Artery territory. No associated hemorrhage or mass effect. 2. Expected evolution of the right MCA infarcts  last year. Otherwise stable underlying chronic small vessel disease. Electronically Signed   By: Genevie Ann M.D.   On: 01/09/2020 15:42   MYOCARDIAL PERFUSION IMAGING  Result Date: 01/09/2020 Please see documentation in chart; this is a non-gated rest-only study. Patient had rest images completed, which are without perfusion defects. Prior to acquisition of stress images, patient had event concerning for acute stroke and was urgently transported to the  hospital via EMS.  CT HEAD CODE STROKE WO CONTRAST  Result Date: 01/09/2020 CLINICAL DATA:  Code stroke. Acute neuro deficit. Headache and vomiting. EXAM: CT HEAD WITHOUT CONTRAST TECHNIQUE: Contiguous axial images were obtained from the base of the skull through the vertex without intravenous contrast. COMPARISON:  05/31/2018 FINDINGS: Brain: Chronic microvascular ischemic change in the white matter. Chronic infarct in the right frontal parietal cortex. Negative for acute infarct, hemorrhage, mass Vascular: Atherosclerotic calcification in the carotid and vertebral arteries. Negative for hyperdense vessel Skull: Negative Sinuses/Orbits: Paranasal sinuses clear. Left cataract extraction. No orbital lesion. Other: None ASPECTS ([place] Stroke Program Early CT Score) - Ganglionic level infarction (caudate, lentiform nuclei, internal capsule, insula, M1-M3 cortex): 7 - Supraganglionic infarction (M4-M6 cortex): 3 Total score (0-10 with 10 being normal): 10 IMPRESSION: 1. No acute abnormality 2. ASPECTS is 10 3. Chronic infarct right frontal parietal cortex. Chronic microvascular ischemic change in the white matter. 4. Code stroke imaging results were communicated on 01/09/2020 at 1:01 pm to provider Dr. Leonel Ramsay via [facility] Electronically Signed   By: Franchot Gallo M.D.   On: 01/09/2020 13:02    EKG: Independently reviewed. NSR  Assessment/Plan Active Problems:   CVA (cerebral vascular accident) ([place])  (please populate well all problems here in Problem List. (For example, if patient is on BP meds at home and you resume or decide to hold them, it is a problem that needs to be her. 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 Fib -Rate controlled, continue Eliquis  Hypertension -As  above  HLD -Check lipid panel, continue statin  DVT prophylaxis: Lovenox Code Status: Full code Family Communication: Husband at bedside Disposition Plan: Expect more than 2 midnight hospital stay for stroke work-up and PT evaluation Consults called: Neurology Dr. Leonel Ramsay Admission status: Telemetry admission   Lequita Halt MD [facility] Pager [phone]  01/09/2020, 6:46 PM

## 2020-01-09 NOTE — ED Notes (Signed)
Purewick placed on pt. 

## 2020-01-09 NOTE — Progress Notes (Signed)
Cardiology Office Note:    Date:  01/09/2020   ID:  [facility] [place], [place] February 27, 1941, MRN [id number]  PCP:  [name], MD  Cardiologist:  Mertie Moores, MD    Referring MD: [name], MD   Chief Complaint  Patient presents with  . Dizziness    Acute visual disturbance, weakness, difficult swallowing and visual disturbance    History of Present Illness:    Wendee Motsinger Console is a 79 y.o. female with a hx of Violia Motsinger Meas is a 79 y.o. female with a hx of PAF, HTN, HLD and CVA. She is followed by Dr. Acie Fredrickson.  She had a DCCV in 10/2018 and has had problems with dizziness when in afib.  She was seen by Dr. Acie Fredrickson in April 2021 and was complaining of her chest quivering but was in NSR. Seen back 01/02/20 and had been having palpitations and was back in afib.  She is chronically on Eliquis 5mg  BID and has not missed any doses and underwent DCCV back to NSR on 01/08/2020.    Today she presented to the office for stress myoview.  Prior to getting ready for the test she had to use the bathroom. She was helped by one of the staff. As she was washing her hands she suddenly was unable to use her hands or walk, became weak and fell being caught by the staff member.  She became acute nauseated, could not swallow and had deviation of her eyes to the right with left neglect on neuro exam.  O2 sats were 90%.  She had slurred speech.  Past Medical History:  Diagnosis Date  . Allergy   . CVA (cerebral vascular accident) ([place])   . Hyperlipidemia LDL goal <70   . Hypertension   . Palpitations   . Vertigo, benign positional     Past Surgical History:  Procedure Laterality Date  . CARDIOVERSION Left 10/31/2018   Procedure: CARDIOVERSION;  Surgeon: Acie Fredrickson Wonda Cheng, MD;  Location: [place];  Service: Cardiovascular;  Laterality: Left;  . CARDIOVERSION N/A 01/08/2020   Procedure: CARDIOVERSION;  Surgeon: [name], MD;  Location: [facility] ENDOSCOPY;  Service:  Cardiovascular;  Laterality: N/A;  . Hemilaminectomy and microdiskectomy at L4-5 on the left.  12/10/2003  . TEE WITHOUT CARDIOVERSION N/A 06/05/2018   Procedure: TRANSESOPHAGEAL ECHOCARDIOGRAM (TEE);  Surgeon: Lelon Perla, MD;  Location[facility] ENDOSCOPY;  Service: Cardiovascular;  Laterality: N/A;  loop    Current Medications: No outpatient medications have been marked as taking for the 01/09/20 encounter (Office Visit) with Sueanne Margarita, MD.     Allergies:   Duloxetine and Acetaminophen   Social History   Socioeconomic History  . Marital status: Married    Spouse name: Not on file  . Number of children: Not on file  . Years of education: Not on file  . Highest education level: Not on file  Occupational History  . Not on file  Tobacco Use  . Smoking status: Never Smoker  . Smokeless tobacco: Never Used  Vaping Use  . Vaping Use: Never used  Substance and Sexual Activity  . Alcohol use: No  . Drug use: No  . Sexual activity: Not on file  Other Topics Concern  . Not on file  Social History Narrative  . Not on file   Social Determinants of Health   Financial Resource Strain:   . Difficulty of Paying Living Expenses: Not on file  Food Insecurity:   . Worried About  Running Out of Food in the Last Year: Not on file  . Ran Out of Food in the Last Year: Not on file  Transportation Needs:   . Lack of Transportation (Medical): Not on file  . Lack of Transportation (Non-Medical): Not on file  Physical Activity:   . Days of Exercise per Week: Not on file  . Minutes of Exercise per Session: Not on file  Stress:   . Feeling of Stress : Not on file  Social Connections:   . Frequency of Communication with Friends and Family: Not on file  . Frequency of Social Gatherings with Friends and Family: Not on file  . Attends Religious Services: Not on file  . Active Member of Clubs or Organizations: Not on file  . Attends Archivist Meetings: Not on file  . Marital  Status: Not on file     Family History: The patient's family history includes Colon cancer in her father; Diabetes in her mother; Heart attack in her mother; Heart attack (age of onset: 33) in her brother.  ROS:   Please see the history of present illness.    ROS  All other systems reviewed and negative.   EKGs/Labs/Other Studies Reviewed:    The following studies were reviewed today: Telemetry showed atrial fibrillation  EKG:  EKG is not ordered today.    Recent Labs: 11/14/2019: TSH 1.48 01/09/2020: ALT 29; BUN 7; Creatinine, Ser 0.76; Hemoglobin 13.9; Platelets 262; Potassium 3.5; Sodium 136   Recent Lipid Panel    Component Value Date/Time   CHOL 229 (H) 11/14/2019 1102   TRIG 87 11/14/2019 1102   HDL 121 11/14/2019 1102   CHOLHDL 1.9 11/14/2019 1102   VLDL 14.6 12/24/2018 1239   LDLCALC 90 11/14/2019 1102    Physical Exam:    VS:  There were no vitals taken for this visit.    Wt Readings from Last 3 Encounters:  01/09/20 142 lb (64.4 kg)  01/08/20 142 lb 10.2 oz (64.7 kg)  01/02/20 142 lb 9.6 oz (64.7 kg)     GEN:  Well nourished, well developed in severe distress due to nausea, weakness, inability to speak correctly and problems with vision HEENT: right gaze preference with left sided neglect NECK: No JVD; No carotid bruits LYMPHATICS: No lymphadenopathy CARDIAC: irregularly irregular, no murmurs, rubs, gallops RESPIRATORY:  Clear to auscultation without rales, wheezing or rhonchi  ABDOMEN: Soft, non-tender, non-distended MUSCULOSKELETAL:  No edema; No deformity  SKIN: Warm and dry NEUROLOGIC:  Somewhat lethargic at times.  Oriented to place.  Muscle strength intact PSYCHIATRIC:  Unable to assess  ASSESSMENT:    1. Acute CVA (cerebrovascular accident) ([place])   2. Paroxysmal atrial fibrillation ([place])   3. Essential hypertension    PLAN:    In order of problems listed above:  1.  Acute CVA -patient had sudden on set of acute inability to move her  arms or legs and fell into staffs arms -complained of inability to swallow and had severe nausea with dry heaves, left sided neglect and right gaze preference.   -symptoms are very worrisome for acute neurologic event -she was just cardioverted yesterday and now back in afib on monitor -EMS has been called and will emergently transport to ER for evaluation -she has been on Eliquis 5mg  BID with no missed doses -concern is for acute CVA vs bleed -needs stat CT head  2.  PAF -s/p DCCV yesterday -now back in afib -will need to be addressed after neurological workup -  continue Eliquis for now -continue Flecainide 50mg  BID and Cardizem CD  3.  HTN -BP controlled -she is on cardizem CD 180mg  daily and Telmisartan 20mg  daily     Medication Adjustments/Labs and Tests Ordered: Current medicines are reviewed at length with the patient today.  Concerns regarding medicines are outlined above.  No orders of the defined types were placed in this encounter.  No orders of the defined types were placed in this encounter.   Signed, Fransico Him, MD  01/09/2020 5:16 PM    [place]

## 2020-01-09 NOTE — ED Triage Notes (Signed)
Pt arrived with EMS from [facility] for possible code stroke. Was attending her [facility] appointment for a stress test. Pt was found in facility restroom not alert and unable to move. EMS reports that no signs of deficits were present during transport, but they did notice slurred speech. Pt reports taking Eloquis this morning.

## 2020-01-09 NOTE — ED Notes (Signed)
Pt c/o dizziness and nausea- MD (hospitalist) made aware

## 2020-01-09 NOTE — Progress Notes (Signed)
ANTICOAGULATION CONSULT NOTE - Initial Consult  Pharmacy Consult for Lovenox Indication: atrial fibrillation  Allergies  Allergen Reactions  . Duloxetine     Causes Bp elevation.  Increased body jerks, nausea  . Acetaminophen Hives and Rash    Patient Measurements: Actual body weight: 64.4 kg   Vital Signs: Temp: 97.4 F (36.3 C) (09/16 1703) Temp Source: Oral (09/16 1703) BP: 162/97 (09/16 1815) Pulse Rate: 84 (09/16 1815)  Labs: Recent Labs    01/09/20 1248 01/09/20 1301 01/09/20 1325  HGB 13.5 13.9  --   HCT 42.1 41.0  --   PLT 262  --   --   APTT 27  --   --   LABPROT 15.2  --   --   INR 1.3*  --   --   CREATININE  --  0.60 0.76    Estimated Creatinine Clearance: 50.5 mL/min (by C-G formula based on SCr of 0.76 mg/dL).   Medical History: Past Medical History:  Diagnosis Date  . Allergy   . CVA (cerebral vascular accident) ([place])   . Hyperlipidemia LDL goal <70   . Hypertension   . Palpitations   . Vertigo, benign positional     Medications:  (Not in a hospital admission)   Assessment: 39 YOF with h/o of Afib on Eliquis at home presents with onset of vertigo and ataxia. Pharmacy consulted to transition patient to Lovenox. 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 Per discussion with hospitalist and neurologist, will plan to hold anticoagulation tonight and discuss with stroke team in AM prior to resuming anticoagulation.   Goal of Therapy:  Anti-Xa level 0.6-1 units/ml 4hrs after LMWH dose given Monitor platelets by anticoagulation protocol: Yes   Plan:  -Hold anticoagulation tonight in the setting of acute stroke and re-address with stroke team in the morning  Albertina Parr, PharmD., BCPS, BCCCP Clinical Pharmacist Clinical phone for 01/09/20 until 11:30pm: [phone] If after 11:30pm, please refer to [facility] for unit-specific pharmacist

## 2020-01-09 NOTE — ED Provider Notes (Signed)
[facility] EMERGENCY DEPARTMENT Provider Note   CSN: [id number] Arrival date & time: 01/09/20  1237     History No chief complaint on file.   Mary Mercado is a 79 y.o. female.  The history is provided by the patient and medical records. No language interpreter was used.  Cerebrovascular Accident This is a new problem. The current episode started 1 to 2 hours ago. The problem occurs constantly. The problem has not changed since onset.Pertinent negatives include no chest pain, no abdominal pain, no headaches and no shortness of breath. Nothing aggravates the symptoms. Nothing relieves the symptoms. She has tried nothing for the symptoms. The treatment provided no relief.       Past Medical History:  Diagnosis Date  . Allergy   . CVA (cerebral vascular accident) ([place])   . Hyperlipidemia LDL goal <70   . Hypertension   . Palpitations   . Vertigo, benign positional     Patient Active Problem List   Diagnosis Date Noted  . Dizziness 11/30/2018  . Atrial fibrillation ([place]) 08/13/2018  . Prediabetes 06/15/2018  . Palpitations 06/05/2018  . Essential hypertension 06/05/2018  . Hyperlipidemia LDL goal <70 06/05/2018  . 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 SVT (supraventricular tachycardia) ([place]) 05/28/2015  . PVC's (premature ventricular contractions) 05/28/2015  . BENIGN POSITIONAL VERTIGO 06/16/2009  . ALLERGIC RHINITIS 06/16/2009    Past Surgical History:  Procedure Laterality Date  . CARDIOVERSION Left 10/31/2018   Procedure: CARDIOVERSION;  Surgeon: Acie Fredrickson Wonda Cheng, MD;  Location: [facility];  Service: Cardiovascular;  Laterality: Left;  . CARDIOVERSION N/A 01/08/2020   Procedure: CARDIOVERSION;  Surgeon: [name], MD;  Location: [facility] ENDOSCOPY;  Service: Cardiovascular;  Laterality: N/A;  . Hemilaminectomy and microdiskectomy at L4-5 on  the left.  12/10/2003  . TEE WITHOUT CARDIOVERSION N/A 06/05/2018   Procedure: TRANSESOPHAGEAL ECHOCARDIOGRAM (TEE);  Surgeon: Lelon Perla, MD;  Location: [facility] ENDOSCOPY;  Service: Cardiovascular;  Laterality: N/A;  loop     OB History   No obstetric history on file.     Family History  Problem Relation Age of Onset  . Colon cancer Father   . Diabetes Mother   . Heart attack Mother        26  . Heart attack Brother 41    Social History   Tobacco Use  . Smoking status: Never Smoker  . Smokeless tobacco: Never Used  Vaping Use  . Vaping Use: Never used  Substance Use Topics  . Alcohol use: No  . Drug use: No    Home Medications Prior to Admission medications   Medication Sig Start Date End Date Taking? Authorizing Provider  atorvastatin (LIPITOR) 40 MG tablet Take 1 tablet (40 mg total) by mouth daily at 6 PM. 01/09/20   [name], MD  diltiazem (CARDIZEM CD) 180 MG 24 hr capsule TAKE 1 CAPSULE BY MOUTH EVERY DAY Patient taking differently: Take 180 mg by mouth daily.  01/02/20   Nahser, Wonda Cheng, MD  diltiazem (CARDIZEM) 30 MG tablet Take 30 mg by mouth 4 (four) times daily as needed (afib).     [provider]  ELIQUIS 5 MG TABS tablet TAKE 1 TABLET BY MOUTH TWICE A DAY Patient taking differently: Take 5 mg by mouth 2 (two) times daily.  04/23/19   Nahser, Wonda Cheng, MD  famotidine-calcium carbonate-magnesium hydroxide [facility] COMPLETE) [phone]  MG chewable tablet Chew 1 tablet by mouth daily as needed (heartburn).    [provider]  flecainide (TAMBOCOR) 50 MG tablet Take 1 tablet (50 mg total) by mouth 2 (two) times daily. 01/02/20   Nahser, Wonda Cheng, MD  telmisartan (MICARDIS) 20 MG tablet TAKE 1 TABLET BY MOUTH EVERY DAY Patient taking differently: Take 10 mg by mouth daily.  11/05/19   Nahser, Wonda Cheng, MD    Allergies    Duloxetine and Acetaminophen  Review of Systems   Review of Systems  Constitutional: Negative for chills,  diaphoresis, fatigue and fever.  HENT: Negative for congestion.   Eyes: Negative for visual disturbance.  Respiratory: Negative for cough, chest tightness, shortness of breath and wheezing.   Cardiovascular: Negative for chest pain, palpitations and leg swelling.  Gastrointestinal: Positive for nausea and vomiting. Negative for abdominal pain, constipation and diarrhea.  Genitourinary: Negative for dysuria and frequency.  Musculoskeletal: Negative for back pain, neck pain and neck stiffness.  Skin: Negative for rash and wound.  Neurological: Positive for dizziness. Negative for seizures, facial asymmetry, speech difficulty, weakness, light-headedness, numbness and headaches.  Psychiatric/Behavioral: Negative for agitation and confusion.  All other systems reviewed and are negative.   Physical Exam Updated Vital Signs There were no vitals taken for this visit.  Physical Exam Vitals and nursing note reviewed.  Constitutional:      General: She is not in acute distress.    Appearance: She is well-developed. She is ill-appearing. She is not toxic-appearing or diaphoretic.  HENT:     Head: Normocephalic and atraumatic.     Right Ear: External ear normal.     Left Ear: External ear normal.     Nose: Nose normal. No congestion or rhinorrhea.     Mouth/Throat:     Mouth: Mucous membranes are moist.     Pharynx: No oropharyngeal exudate or posterior oropharyngeal erythema.  Eyes:     Extraocular Movements:     Right eye: Nystagmus present.     Left eye: Nystagmus present.     Conjunctiva/sclera: Conjunctivae normal.     Pupils: Pupils are equal, round, and reactive to light.  Cardiovascular:     Rate and Rhythm: Normal rate.     Pulses: Normal pulses.     Heart sounds: No murmur heard.   Pulmonary:     Effort: No respiratory distress.     Breath sounds: No stridor. No wheezing, rhonchi or rales.  Chest:     Chest wall: No tenderness.  Abdominal:     General: Abdomen is flat.  There is no distension.     Tenderness: There is no abdominal tenderness. There is no rebound.  Musculoskeletal:        General: No tenderness.     Cervical back: Normal range of motion and neck supple. No tenderness.  Skin:    General: Skin is warm.     Capillary Refill: Capillary refill takes less than 2 seconds.     Coloration: Skin is not pale.     Findings: No erythema or rash.  Neurological:     Mental Status: She is alert.     Cranial Nerves: No dysarthria or facial asymmetry.     Sensory: No sensory deficit.     Motor: Weakness present. No abnormal muscle tone.     Coordination: Coordination abnormal. Finger-Nose-Finger Test abnormal and Heel to [facility] Test abnormal.     Deep Tendon Reflexes: Reflexes are normal and symmetric.  Psychiatric:  Mood and Affect: Mood normal.     ED Results / Procedures / Treatments   Labs (all labs ordered are listed, but only abnormal results are displayed) Labs Reviewed  PROTIME-INR - Abnormal; Notable for the following components:      Result Value   INR 1.3 (*)    All other components within normal limits  URINALYSIS, ROUTINE W REFLEX MICROSCOPIC - Abnormal; Notable for the following components:   Color, Urine STRAW (*)    Glucose, UA >=500 (*)    Ketones, ur 20 (*)    All other components within normal limits  COMPREHENSIVE METABOLIC PANEL - Abnormal; Notable for the following components:   Glucose, Bld 209 (*)    BUN 7 (*)    Calcium 8.8 (*)    Total Protein 6.4 (*)    All other components within normal limits  HEMOGLOBIN A1C - Abnormal; Notable for the following components:   Hgb A1c MFr Bld 5.9 (*)    All other components within normal limits  CBG MONITORING, ED - Abnormal; Notable for the following components:   Glucose-Capillary 176 (*)    All other components within normal limits  I-STAT CHEM 8, ED - Abnormal; Notable for the following components:   Glucose, Bld 168 (*)    Calcium, Ion 1.10 (*)    All other  components within normal limits  SARS CORONAVIRUS 2 BY RT PCR (HOSPITAL ORDER, [facility])  ETHANOL  APTT  CBC  DIFFERENTIAL  RAPID URINE DRUG SCREEN, HOSP PERFORMED  LIPID PANEL    EKG EKG Interpretation  Date/Time:  Thursday January 09 2020 13:11:08 EDT Ventricular Rate:  75 PR Interval:    QRS Duration: 109 QT Interval:  433 QTC Calculation: 484 R Axis:   92 Text Interpretation: Sinus rhythm Atrial premature complex Consider left atrial enlargement Right axis deviation Probable anteroseptal infarct, old When compared to prior, faster rate. No STEMI Confirmed by Antony Blackbird [phone]) on 01/09/2020 2:59:00 PM   Radiology CT Code Stroke CTA Head W/WO contrast  Result Date: 01/09/2020 CLINICAL DATA:  Focal neuro deficit.  Dizziness. EXAM: CT ANGIOGRAPHY HEAD AND NECK TECHNIQUE: Multidetector CT imaging of the head and neck was performed using the standard protocol during bolus administration of intravenous contrast. Multiplanar CT image reconstructions and MIPs were obtained to evaluate the vascular anatomy. Carotid stenosis measurements (when applicable) are obtained utilizing NASCET criteria, using the distal internal carotid diameter as the denominator. CONTRAST:  63mL OMNIPAQUE IOHEXOL 350 MG/ML SOLN 75 mL Omnipaque 350 IV COMPARISON:  CT head 01/09/2020 FINDINGS: CTA NECK FINDINGS Aortic arch: Atherosclerotic calcification aortic arch. Atherosclerotic calcification in the great vessels especially left subclavian artery without significant stenosis. Right carotid system: Atherosclerotic disease at the right carotid bifurcation. 25% diameter stenosis proximal right internal carotid artery. Left carotid system: Atherosclerotic calcification left carotid bifurcation without stenosis. Vertebral arteries: Right vertebral artery dominant and without stenosis. Moderate to severe stenosis at the origin of the left vertebral artery. Left vertebral artery is then  patent to the basilar without additional stenosis. Skeleton: No acute abnormality. Other neck: Negative for mass or adenopathy. Upper chest: Lung apices clear bilaterally. Review of the MIP images confirms the above findings CTA HEAD FINDINGS Anterior circulation: Atherosclerotic calcification throughout the cavernous carotid bilaterally with mild to moderate stenosis bilaterally. Anterior and middle cerebral arteries patent bilaterally without large vessel occlusion or flow limiting stenosis. Posterior circulation: Both vertebral arteries patent to the basilar. PICA patent bilaterally. Basilar widely patent.  Mild atherosclerotic irregularity in the basilar. AICA, superior cerebellar, and posterior cerebral arteries patent bilaterally without large vessel occlusion. Moderate stenosis right P2 segment. Left posterior cerebral artery patent without stenosis. Venous sinuses: Normal venous enhancement. Anatomic variants: None Review of the MIP images confirms the above findings IMPRESSION: 1. Negative for intracranial large vessel occlusion 2. 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 Mild to moderate atherosclerotic stenosis in the supraclinoid internal carotid artery bilaterally. 3. 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 Right vertebral artery widely patent 4. Moderate stenosis right P2 segment. 5. These results were called by telephone at the time of interpretation on 01/09/2020 at 1:15 pm to provider MCNEILL [facility] , who verbally acknowledged these results. Electronically Signed   By: Franchot Gallo M.D.   On: 01/09/2020 13:16   CT Code Stroke CTA Neck W/WO contrast  Result Date: 01/09/2020 CLINICAL DATA:  Focal neuro deficit.  Dizziness. EXAM: CT ANGIOGRAPHY HEAD AND NECK TECHNIQUE: Multidetector CT imaging of the head and neck was performed using the standard protocol during bolus administration of intravenous contrast. Multiplanar CT  image reconstructions and MIPs were obtained to evaluate the vascular anatomy. Carotid stenosis measurements (when applicable) are obtained utilizing NASCET criteria, using the distal internal carotid diameter as the denominator. CONTRAST:  65mL OMNIPAQUE IOHEXOL 350 MG/ML SOLN 75 mL Omnipaque 350 IV COMPARISON:  CT head 01/09/2020 FINDINGS: CTA NECK FINDINGS Aortic arch: Atherosclerotic calcification aortic arch. Atherosclerotic calcification in the great vessels especially left subclavian artery without significant stenosis. Right carotid system: Atherosclerotic disease at the right carotid bifurcation. 25% diameter stenosis proximal right internal carotid artery. Left carotid system: Atherosclerotic calcification left carotid bifurcation without stenosis. Vertebral arteries: Right vertebral artery dominant and without stenosis. Moderate to severe stenosis at the origin of the left vertebral artery. Left vertebral artery is then patent to the basilar without additional stenosis. Skeleton: No acute abnormality. Other neck: Negative for mass or adenopathy. Upper chest: Lung apices clear bilaterally. Review of the MIP images confirms the above findings CTA HEAD FINDINGS Anterior circulation: Atherosclerotic calcification throughout the cavernous carotid bilaterally with mild to moderate stenosis bilaterally. Anterior and middle cerebral arteries patent bilaterally without large vessel occlusion or flow limiting stenosis. Posterior circulation: Both vertebral arteries patent to the basilar. PICA patent bilaterally. Basilar widely patent. Mild atherosclerotic irregularity in the basilar. AICA, superior cerebellar, and posterior cerebral arteries patent bilaterally without large vessel occlusion. Moderate stenosis right P2 segment. Left posterior cerebral artery patent without stenosis. Venous sinuses: Normal venous enhancement. Anatomic variants: None Review of the MIP images confirms the above findings IMPRESSION: 1.  Negative for intracranial large vessel occlusion 2. 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 Mild to moderate atherosclerotic stenosis in the supraclinoid internal carotid artery bilaterally. 3. 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 Right vertebral artery widely patent 4. Moderate stenosis right P2 segment. 5. These results were called by telephone at the time of interpretation on 01/09/2020 at 1:15 pm to provider MCNEILL [facility] , who verbally acknowledged these results. Electronically Signed   By: Franchot Gallo M.D.   On: 01/09/2020 13:16   MR BRAIN WO CONTRAST  Result Date: 01/09/2020 CLINICAL DATA:  79 year old female code stroke presentation this morning. Headache and vomiting. EXAM: MRI HEAD WITHOUT CONTRAST TECHNIQUE: Multiplanar, multiecho pulse sequences of the brain and surrounding structures were obtained without intravenous contrast. COMPARISON:  CT head and CTA head and neck earlier today. Brain MRI 06/01/2018. FINDINGS: Brain:  Patchy 3 cm area of restricted diffusion in the right superior cerebellar artery territory (series 3, image 14). Minimal T2 and FLAIR hyperintensity. No associated hemorrhage or mass effect. Superimposed chronic microhemorrhage elsewhere in the right cerebellar hemisphere. No other convincing posterior fossa restricted diffusion. Expected evolution of the posterior right frontal lobe/posterior operculum infarct last year with encephalomalacia there now. No supratentorial restricted diffusion today. Elsewhere stable gray and white matter signal including Patchy and confluent bilateral cerebral white matter and pontine T2 and FLAIR hyperintensity.-no other chronic cerebral blood products. No midline shift, mass effect, evidence of mass lesion, ventriculomegaly, extra-axial collection or acute intracranial hemorrhage. Cervicomedullary junction and pituitary are within normal limits. Vascular: Major  intracranial vascular flow voids are stable since last year. Skull and upper cervical spine: Negative for age visible cervical spine. Visualized bone marrow signal is within normal limits. Sinuses/Orbits: Stable, negative. Other: Mastoids remain clear. Grossly stable and normal visible internal auditory structures. Visible scalp and face appear negative. IMPRESSION: 1. Acute 3 cm infarct in the Right Superior Cerebellar Artery territory. No associated hemorrhage or mass effect. 2. Expected evolution of the right MCA infarcts last year. Otherwise stable underlying chronic small vessel disease. Electronically Signed   By: Genevie Ann M.D.   On: 01/09/2020 15:42   MYOCARDIAL PERFUSION IMAGING  Result Date: 01/09/2020 Please see documentation in chart; this is a non-gated rest-only study. Patient had rest images completed, which are without perfusion defects. Prior to acquisition of stress images, patient had event concerning for acute stroke and was urgently transported to the hospital via EMS.  ECHOCARDIOGRAM COMPLETE  Result Date: 01/10/2020    ECHOCARDIOGRAM REPORT   Patient Name:   Mary Mercado Date of Exam: 01/10/2020 Medical Rec #:  [id number]                 Height:       65.0 in Accession #:    [id number]                Weight:       142.0 lb Date of Birth:  1941/04/19                 BSA:          1.710 m Patient Age:    37 years                  BP:           148/85 mmHg Patient Gender: F                         HR:           83 bpm. Exam Location:  Inpatient Procedure: 2D Echo, 3D Echo, Color Doppler and Cardiac Doppler Indications:    TIA  History:        Patient has prior history of Echocardiogram examinations, most                 recent 06/06/2019. Arrythmias:Atrial Fibrillation; Risk                 Factors:COVID+ 08/2018, Hypertension and Dyslipidemia. 2 days                 post cardioversion per patient.  Sonographer:    Raquel Sarna Senior RDCS Referring Phys: [id number] Pablo Pena   1. Left ventricular ejection fraction, by estimation, is 60 to 65%. The left ventricle has normal function. The  left ventricle has no regional wall motion abnormalities. Left ventricular diastolic parameters are consistent with Grade II diastolic dysfunction (pseudonormalization). Elevated left atrial pressure.  2. Right ventricular systolic function is normal. The right ventricular size is normal. There is moderately elevated pulmonary artery systolic pressure. The estimated right ventricular systolic pressure is 73.7 mmHg.  3. Left atrial size was severely dilated.  4. Right atrial size was severely dilated.  5. The mitral valve is degenerative. Mild mitral valve regurgitation. No evidence of mitral stenosis.  6. Tricuspid valve regurgitation is moderate to severe.  7. The aortic valve is tricuspid. There is mild calcification of the aortic valve. There is mild thickening of the aortic valve. Aortic valve regurgitation is not visualized. Mild to moderate aortic valve sclerosis/calcification is present, without any evidence of aortic stenosis.  8. The inferior vena cava is normal in size with <50% respiratory variability, suggesting right atrial pressure of 8 mmHg. Comparison(s): No significant change from prior study. FINDINGS  Left Ventricle: Left ventricular ejection fraction, by estimation, is 60 to 65%. The left ventricle has normal function. The left ventricle has no regional wall motion abnormalities. The left ventricular internal cavity size was normal in size. There is  no left ventricular hypertrophy. Left ventricular diastolic parameters are consistent with Grade II diastolic dysfunction (pseudonormalization). Elevated left atrial pressure. Right Ventricle: The right ventricular size is normal. No increase in right ventricular wall thickness. Right ventricular systolic function is normal. There is moderately elevated pulmonary artery systolic pressure. The tricuspid regurgitant velocity is 3.48 m/s, and  with an assumed right atrial pressure of 8 mmHg, the estimated right ventricular systolic pressure is 10.6 mmHg. Left Atrium: Left atrial size was severely dilated. Right Atrium: Right atrial size was severely dilated. Pericardium: Trivial pericardial effusion is present. Mitral Valve: The mitral valve is degenerative in appearance. There is moderate calcification of the posterior mitral valve leaflet(s). Mild mitral annular calcification. Mild mitral valve regurgitation. No evidence of mitral valve stenosis. Tricuspid Valve: The tricuspid valve is grossly normal. Tricuspid valve regurgitation is moderate to severe. No evidence of tricuspid stenosis. Aortic Valve: The aortic valve is tricuspid. There is mild calcification of the aortic valve. There is mild thickening of the aortic valve. Aortic valve regurgitation is not visualized. Mild to moderate aortic valve sclerosis/calcification is present, without any evidence of aortic stenosis. Pulmonic Valve: The pulmonic valve was grossly normal. Pulmonic valve regurgitation is not visualized. No evidence of pulmonic stenosis. Aorta: The aortic root and ascending aorta are structurally normal, with no evidence of dilitation. Venous: The inferior vena cava is normal in size with less than 50% respiratory variability, suggesting right atrial pressure of 8 mmHg. IAS/Shunts: The atrial septum is grossly normal.  LEFT VENTRICLE PLAX 2D LVIDd:         4.20 cm  Diastology LVIDs:         3.00 cm  LV e' medial:    8.27 cm/s LV PW:         0.80 cm  LV E/e' medial:  11.9 LV IVS:        0.90 cm  LV e' lateral:   11.00 cm/s LVOT diam:     2.10 cm  LV E/e' lateral: 9.0 LV SV:         72 LV SV Index:   42 LVOT Area:     3.46 cm  RIGHT VENTRICLE RV S prime:     12.90 cm/s TAPSE (M-mode): 2.1 cm LEFT ATRIUM  Index       RIGHT ATRIUM           Index LA diam:        4.90 cm  2.87 cm/m  RA Area:     19.50 cm LA Vol (A2C):   100.0 ml 58.47 ml/m RA Volume:   52.30 ml  30.58  ml/m LA Vol (A4C):   65.0 ml  38.01 ml/m LA Biplane Vol: 81.0 ml  47.36 ml/m  AORTIC VALVE LVOT Vmax:   96.90 cm/s LVOT Vmean:  67.700 cm/s LVOT VTI:    0.207 m  AORTA Ao Asc diam: 3.40 cm MITRAL VALVE               TRICUSPID VALVE MV Area (PHT): 4.21 cm    TR Peak grad:   48.4 mmHg MV Decel Time: 180 msec    TR Vmax:        348.00 cm/s MV E velocity: 98.80 cm/s MV A velocity: 54.80 cm/s  SHUNTS MV E/A ratio:  1.80        Systemic VTI:  0.21 m                            Systemic Diam: 2.10 cm Eleonore Chiquito MD Electronically signed by Eleonore Chiquito MD Signature Date/Time: 01/10/2020/10:40:54 AM    Final    CT HEAD CODE STROKE WO CONTRAST  Result Date: 01/09/2020 CLINICAL DATA:  Code stroke. Acute neuro deficit. Headache and vomiting. EXAM: CT HEAD WITHOUT CONTRAST TECHNIQUE: Contiguous axial images were obtained from the base of the skull through the vertex without intravenous contrast. COMPARISON:  05/31/2018 FINDINGS: Brain: Chronic microvascular ischemic change in the white matter. Chronic infarct in the right frontal parietal cortex. Negative for acute infarct, hemorrhage, mass Vascular: Atherosclerotic calcification in the carotid and vertebral arteries. Negative for hyperdense vessel Skull: Negative Sinuses/Orbits: Paranasal sinuses clear. Left cataract extraction. No orbital lesion. Other: None ASPECTS ([place] Stroke Program Early CT Score) - Ganglionic level infarction (caudate, lentiform nuclei, internal capsule, insula, M1-M3 cortex): 7 - Supraganglionic infarction (M4-M6 cortex): 3 Total score (0-10 with 10 being normal): 10 IMPRESSION: 1. No acute abnormality 2. ASPECTS is 10 3. Chronic infarct right frontal parietal cortex. Chronic microvascular ischemic change in the white matter. 4. Code stroke imaging results were communicated on 01/09/2020 at 1:01 pm to provider Dr. Leonel Ramsay via Shea Evans Electronically Signed   By: Franchot Gallo M.D.   On: 01/09/2020 13:02    Procedures Procedures  (including critical care time)  Medications Ordered in ED Medications  ondansetron (ZOFRAN) 4 MG/2ML injection (has no administration in time range)    ED Course  I have reviewed the triage vital signs and the nursing notes.  Pertinent labs & imaging results that were available during my care of the patient were reviewed by me and considered in my medical decision making (see chart for details).    MDM Rules/Calculators/A&P                          Mary Mercado is a 79 y.o. female with a past medical history significant for prior right-sided stroke status post TPA last year, hypertension, hyperlipidemia, atrial fibrillation on Eliquis, BPPV, and prior SVT who presents from a heart care appointment for stress test where she had sudden onset of altered mental status/unresponsiveness, slurred speech, left-sided weakness, and dizziness.  According to EMS, patient was at heart care  getting regular stress test when she went minimally responsive and then when waking up started having left-sided weakness including facial droop, difficulty with coordination, slurred speech, and confusion.  Patient was made a code stroke and quickly brought to the emergency department.  On arrival, airway was intact.  Patient taken to CT scanner for further imaging.  Patient also recently had cardioversion yesterday according to documentation from A. fib to sinus rhythm.  EKG on arrival with sinus rhythm.  On my initial evaluation, patient did have some ataxia with left arm and left leg and had possible mild weakness on that side.  Otherwise patient good strength in her right side.  Patient had significant nausea and vomiting and felt dizzy like the world was spinning uncontrollably.  Patient was given Zofran initially and then was given Compazine to help.  Initial CT scan did not show evidence of LVO or acute stroke.  Some atherosclerosis was seen.  Neurology recommended MRI to help determine disposition.   They feel this may be recrudescence of prior stroke in relation to recent medication use for her stress test.  Anticipate reassessment after MRI and neurology recommendations.  MRI confirms stroke. Pt will be admitted for stroke evaluation and management.    Final Clinical Impression(s) / ED Diagnoses Final diagnoses:  Cerebrovascular accident (CVA), unspecified mechanism ([place])    Clinical Impression: 1. Cerebrovascular accident (CVA), unspecified mechanism ([place])   2. Encounter for imaging study to confirm nasogastric (NG) tube placement     Disposition: Admit  This note was prepared with assistance of Dragon voice recognition software. Occasional wrong-word or sound-a-like substitutions may have occurred due to the inherent limitations of voice recognition software.     Tegeler, Gwenyth Allegra, MD 01/10/20 1102

## 2020-01-09 NOTE — ED Notes (Signed)
Patient transported to MRI 

## 2020-01-10 ENCOUNTER — Inpatient Hospital Stay (HOSPITAL_COMMUNITY): Payer: Medicare Other

## 2020-01-10 DIAGNOSIS — I361 Nonrheumatic tricuspid (valve) insufficiency: Secondary | ICD-10-CM

## 2020-01-10 DIAGNOSIS — I10 Essential (primary) hypertension: Secondary | ICD-10-CM

## 2020-01-10 DIAGNOSIS — I480 Paroxysmal atrial fibrillation: Secondary | ICD-10-CM

## 2020-01-10 DIAGNOSIS — I340 Nonrheumatic mitral (valve) insufficiency: Secondary | ICD-10-CM

## 2020-01-10 DIAGNOSIS — R1310 Dysphagia, unspecified: Secondary | ICD-10-CM

## 2020-01-10 LAB — LIPID PANEL
Cholesterol: 183 mg/dL (ref 0–200)
HDL: 92 mg/dL (ref >40)
LDL Cholesterol: 81 mg/dL (ref 0–99)
Total CHOL/HDL Ratio: 2 RATIO
Triglycerides: 52 mg/dL (ref <150)
VLDL: 10 mg/dL (ref 0–40)

## 2020-01-10 LAB — ECHOCARDIOGRAM COMPLETE
Area-P 1/2: 4.21 cm2
Height: 65 in
S' Lateral: 3 cm
Weight: 2272 oz

## 2020-01-10 LAB — HEMOGLOBIN A1C
Hgb A1c MFr Bld: 5.9 % — ABNORMAL HIGH (ref 4.8–5.6)
Mean Plasma Glucose: 122.63 mg/dL

## 2020-01-10 MED ORDER — ASPIRIN 300 MG RE SUPP
300.0000 mg | Freq: Every day | RECTAL | Status: DC
  Administered 2020-01-10: 300 mg via RECTAL
  Filled 2020-01-10: qty 1

## 2020-01-10 MED ORDER — ENOXAPARIN SODIUM 40 MG/0.4ML ~~LOC~~ SOLN
40.0000 mg | SUBCUTANEOUS | Status: DC
  Administered 2020-01-10 – 2020-01-12 (×3): 40 mg via SUBCUTANEOUS
  Filled 2020-01-10 (×2): qty 0.4

## 2020-01-10 MED ORDER — OXYMETAZOLINE HCL 0.05 % NA SOLN
2.0000 | Freq: Once | NASAL | Status: AC
  Administered 2020-01-10: 2 via NASAL
  Filled 2020-01-10: qty 30

## 2020-01-10 MED ORDER — ATORVASTATIN CALCIUM 80 MG PO TABS
80.0000 mg | ORAL_TABLET | Freq: Every day | ORAL | Status: DC
  Administered 2020-01-10 – 2020-01-13 (×4): 80 mg via ORAL
  Filled 2020-01-10 (×4): qty 1

## 2020-01-10 MED ORDER — CALCIUM CARBONATE ANTACID 500 MG PO CHEW: 800.0000 mg | CHEWABLE_TABLET | Freq: Every day | ORAL | Status: DC | PRN

## 2020-01-10 MED ORDER — FLECAINIDE ACETATE 50 MG PO TABS
50.0000 mg | ORAL_TABLET | Freq: Two times a day (BID) | ORAL | Status: DC
  Administered 2020-01-10 – 2020-01-14 (×8): 50 mg via ORAL
  Filled 2020-01-10 (×9): qty 1

## 2020-01-10 MED ORDER — ASPIRIN EC 81 MG PO TBEC
81.0000 mg | DELAYED_RELEASE_TABLET | Freq: Every day | ORAL | Status: DC
  Administered 2020-01-11 – 2020-01-13 (×3): 81 mg via ORAL
  Filled 2020-01-10 (×3): qty 1

## 2020-01-10 MED ORDER — FAMOTIDINE 10 MG PO TABS: 10.0000 mg | ORAL_TABLET | Freq: Every day | ORAL | Status: DC | PRN

## 2020-01-10 NOTE — Progress Notes (Signed)
Pt was unable to stand up today due to dizziness and impending nausea.  Her tolerance for sitting is limited to wgt shift to L hip, but also demonstrating changes of coordination and motor control on L side with ataxic movement.  Follow acutely to prepare for CIR with work on balance, coordination and standing balance control as tolerated with her symptoms.  01/10/20 1600  PT Visit Information  Last PT Received On 01/10/20  Assistance Needed +1  History of Present Illness 79 yo female with onset of ataxia, vertigo and slurred speech was admitted and noted R superior cerebellar artery infarct.  Additionally has urinary retention.  PMHx:  cardioversion, CVA, HTN, palpitations, vertigo,   Precautions  Precautions Fall  Precaution Comments dizzy with eyes open, struggling to sit up  Restrictions  Weight Bearing Restrictions No  Other Position/Activity Restrictions poor tolerance for upright sitting  Home Living  Family/patient expects to be discharged to: Private residence  Living Arrangements Spouse/significant other  Available Help at Discharge Family;Available 24 hours/day  Type of Home House  Home Access Level entry  Home Layout One level  [place] None  Additional Comments has not needed AD since last CVA after leaving CIR  Prior Function  Level of Independence Independent  Comments driving, able to do self care and manage house  Communication  Communication Expressive difficulties  Pain Assessment  Pain Assessment No/denies pain  Cognition  Arousal/Alertness Awake/alert  Behavior During Therapy Flat affect  Overall Cognitive Status Within Functional Limits for tasks assessed  General Comments flat appearance as pt is avoiding eyes open, struggling to control vomiting from dizziness  Upper Extremity Assessment  Upper Extremity Assessment LUE deficits/detail  LUE Deficits / Details ataxia on LUE with fine motor skills and finger to nose task  LUE  Sensation decreased light touch  LUE Coordination decreased gross motor;decreased fine motor  Lower Extremity Assessment  Lower Extremity Assessment LLE deficits/detail  LLE Deficits / Details gross motor incoordination with control of hip knee and ankle  LLE Sensation decreased light touch  LLE Coordination decreased fine motor;decreased gross motor  Cervical / Trunk Assessment  Cervical / Trunk Assessment Kyphotic  Bed Mobility  Overal bed mobility Needs Assistance  Bed Mobility Sidelying to Sit;Sit to Sidelying  Sidelying to sit Mod assist  Sit to sidelying Mod assist  General bed mobility comments mod assist as pt is not feeling well and struggling to push up from side to sit  Transfers  Overall transfer level  (deferred)  General transfer comment could not stand due to dizziness, pt cannot even sit evenly on hips to L side  Ambulation/Gait  General Gait Details unable to tolerate  Balance  Overall balance assessment Needs assistance  Sitting-balance support Feet supported  Sitting balance-Leahy Scale Poor  Sitting balance - Comments increased control during sitting time to fair-  Postural control Right lateral lean  Standing balance comment unable to tolerate  General Comments  General comments (skin integrity, edema, etc.) pt is noted to have incoordination on L side but strength on LLE is grossly [facility]  PT - End of Session  Activity Tolerance Treatment limited secondary to medical complications (Comment)  Patient left in bed;with call bell/phone within reach;with family/visitor present  Nurse Communication Mobility status  PT Assessment  PT Recommendation/Assessment Patient needs continued PT services  PT Visit Diagnosis Other abnormalities of gait and mobility (R26.89);Ataxic gait (R26.0);Difficulty in walking, not elsewhere classified (R26.2);Other symptoms and signs involving the nervous system (  R29.898)  PT Problem List Decreased range of motion;Decreased activity  tolerance;Decreased balance;Decreased coordination;Decreased knowledge of use of DME;Decreased safety awareness  Barriers to Discharge Decreased caregiver support  Barriers to Discharge Comments pt will currently require two person help to stand and is home with husband  PT Plan  PT Frequency (ACUTE ONLY) Min 4X/week  PT Treatment/Interventions (ACUTE ONLY) DME instruction;Gait training;Functional mobility training;Therapeutic activities;Therapeutic exercise;Balance training;Neuromuscular re-education;Patient/family education  AM-PAC PT "6 Clicks" Mobility Outcome Measure (Version 2)  Help needed turning from your back to your side while in a flat bed without using bedrails? 3  Help needed moving from lying on your back to sitting on the side of a flat bed without using bedrails? 2  Help needed moving to and from a bed to a chair (including a wheelchair)? 2  Help needed standing up from a chair using your arms (e.g., wheelchair or bedside chair)? 2  Help needed to walk in hospital room? 1  Help needed climbing 3-5 steps with a railing?  1  6 Click Score 11  Consider Recommendation of Discharge To: CIR/SNF/LTACH  PT Recommendation  Recommendations for Other Services Rehab consult  Follow Up Recommendations CIR  PT equipment None recommended by PT  Individuals Consulted  Consulted and Agree with Results and Recommendations Patient;Family member/caregiver  Family Member Consulted husband  Acute Rehab PT Goals  Patient Stated Goal to walk and get home  PT Goal Formulation With patient/family  Time For Goal Achievement 01/24/20  Potential to Achieve Goals Good  PT Time Calculation  PT Start Time (ACUTE ONLY) 1129  PT Stop Time (ACUTE ONLY) 1158  PT Time Calculation (min) (ACUTE ONLY) 29 min  PT General Charges  $$ ACUTE PT VISIT 1 Visit  PT Evaluation  $PT Eval Moderate Complexity 1 Mod  PT Treatments  $Therapeutic Activity 8-22 mins  Written Expression  Dominant Hand Right     Mee Hives, PT MS Acute Rehab Dept. Number: [place] [facility] [place]

## 2020-01-10 NOTE — ED Notes (Signed)
Per Dr. Darrick Grinder verbal orders- no NGT d/t epistaxis and holding PO meds (dilt and tambocor) until SLP comes to evaluate pt in am to do full swallowing/dysphagia screen. MD spoke w pt, pt had taken doses at home yesterday morning and she is in NSR. Will continue to monitor pt and alert MD if any arrhythmia or other concerns occur

## 2020-01-10 NOTE — Progress Notes (Signed)
Echocardiogram 2D Echocardiogram has been performed.  Oneal Deputy Syd Newsome 01/10/2020, 8:41 AM

## 2020-01-10 NOTE — ED Notes (Signed)
Pt had PO meds due, advised MD that pt had been NPO d/t failing dysphagia screen (as per previous RN told me). MD ordered NGT. Attempted new dysphagia screen to see if pt swallowing improved, pt able to swallow without complications. Advised MD, but was told to continue NGT d/t inconsistency w swallowing. Will attempt NGT

## 2020-01-10 NOTE — ED Notes (Signed)
SCDs applied at this time.

## 2020-01-10 NOTE — ED Notes (Signed)
Pt husband at bedside

## 2020-01-10 NOTE — Progress Notes (Signed)
STROKE TEAM PROGRESS NOTE   INTERVAL HISTORY Her husband is at the bedside.  She had a cardioversion last week. EKG afterwards ok. Did not miss any Eliquis doses.  I personally reviewed history of presenting illness, electronic medical records and imaging films in PACS.  She presented with vision difficulties and MRI scan shows right superior cerebellar embolic infarct.  CT angiogram shows moderate left vertebral origin and right P2 stenosis.  LDL cholesterol is 81 mg percent and hemoglobin A1c is 5.9.  Echocardiogram is pending.  Vitals:   01/10/20 0300 01/10/20 0400 01/10/20 0500 01/10/20 0900  BP: 138/65 (!) 153/104 (!) 121/97 (!) 142/66  Pulse: 66 83 79 74  Resp: 18 14 (!) 21 (!) 22  Temp:      TempSrc:      SpO2: 98% 95% 95% 95%   CBC:  Recent Labs  Lab 01/09/20 1248 01/09/20 1301  WBC 7.7  --   NEUTROABS 4.9  --   HGB 13.5 13.9  HCT 42.1 41.0  MCV 92.5  --   PLT 262  --    Basic Metabolic Panel:  Recent Labs  Lab 01/09/20 1301 01/09/20 1325  NA 137 136  K 3.6 3.5  CL 103 101  CO2  --  24  GLUCOSE 168* 209*  BUN 9 7*  CREATININE 0.60 0.76  CALCIUM  --  8.8*   Lipid Panel:  Recent Labs  Lab 01/10/20 0550  CHOL 183  TRIG 52  HDL 92  CHOLHDL 2.0  VLDL 10  LDLCALC 81   HgbA1c:  Recent Labs  Lab 01/10/20 0550  HGBA1C 5.9*   Urine Drug Screen:  Recent Labs  Lab 01/09/20 2210  LABOPIA NONE DETECTED  COCAINSCRNUR NONE DETECTED  LABBENZ NONE DETECTED  AMPHETMU NONE DETECTED  THCU NONE DETECTED  LABBARB NONE DETECTED    Alcohol Level  Recent Labs  Lab 01/09/20 1248  ETH <10    IMAGING past 24 hours CT Code Stroke CTA Head W/WO contrast  Result Date: 01/09/2020 CLINICAL DATA:  Focal neuro deficit.  Dizziness. EXAM: CT ANGIOGRAPHY HEAD AND NECK TECHNIQUE: Multidetector CT imaging of the head and neck was performed using the standard protocol during bolus administration of intravenous contrast. Multiplanar CT image reconstructions and MIPs were  obtained to evaluate the vascular anatomy. Carotid stenosis measurements (when applicable) are obtained utilizing NASCET criteria, using the distal internal carotid diameter as the denominator. CONTRAST:  71mL OMNIPAQUE IOHEXOL 350 MG/ML SOLN 75 mL Omnipaque 350 IV COMPARISON:  CT head 01/09/2020 FINDINGS: CTA NECK FINDINGS Aortic arch: Atherosclerotic calcification aortic arch. Atherosclerotic calcification in the great vessels especially left subclavian artery without significant stenosis. Right carotid system: Atherosclerotic disease at the right carotid bifurcation. 25% diameter stenosis proximal right internal carotid artery. Left carotid system: Atherosclerotic calcification left carotid bifurcation without stenosis. Vertebral arteries: Right vertebral artery dominant and without stenosis. Moderate to severe stenosis at the origin of the left vertebral artery. Left vertebral artery is then patent to the basilar without additional stenosis. Skeleton: No acute abnormality. Other neck: Negative for mass or adenopathy. Upper chest: Lung apices clear bilaterally. Review of the MIP images confirms the above findings CTA HEAD FINDINGS Anterior circulation: Atherosclerotic calcification throughout the cavernous carotid bilaterally with mild to moderate stenosis bilaterally. Anterior and middle cerebral arteries patent bilaterally without large vessel occlusion or flow limiting stenosis. Posterior circulation: Both vertebral arteries patent to the basilar. PICA patent bilaterally. Basilar widely patent. Mild atherosclerotic irregularity in the basilar. AICA, superior cerebellar,  and posterior cerebral arteries patent bilaterally without large vessel occlusion. Moderate stenosis right P2 segment. Left posterior cerebral artery patent without stenosis. Venous sinuses: Normal venous enhancement. Anatomic variants: None Review of the MIP images confirms the above findings IMPRESSION: 1. Negative for intracranial large  vessel occlusion 2. 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 Mild to moderate atherosclerotic stenosis in the supraclinoid internal carotid artery bilaterally. 3. 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 Right vertebral artery widely patent 4. Moderate stenosis right P2 segment. 5. These results were called by telephone at the time of interpretation on 01/09/2020 at 1:15 pm to provider [facility]NEILL [facility] , who verbally acknowledged these results. Electronically Signed   By: Franchot Gallo M.D.   On: 01/09/2020 13:16   CT Code Stroke CTA Neck W/WO contrast  Result Date: 01/09/2020 CLINICAL DATA:  Focal neuro deficit.  Dizziness. EXAM: CT ANGIOGRAPHY HEAD AND NECK TECHNIQUE: Multidetector CT imaging of the head and neck was performed using the standard protocol during bolus administration of intravenous contrast. Multiplanar CT image reconstructions and MIPs were obtained to evaluate the vascular anatomy. Carotid stenosis measurements (when applicable) are obtained utilizing NASCET criteria, using the distal internal carotid diameter as the denominator. CONTRAST:  71mL OMNIPAQUE IOHEXOL 350 MG/ML SOLN 75 mL Omnipaque 350 IV COMPARISON:  CT head 01/09/2020 FINDINGS: CTA NECK FINDINGS Aortic arch: Atherosclerotic calcification aortic arch. Atherosclerotic calcification in the great vessels especially left subclavian artery without significant stenosis. Right carotid system: Atherosclerotic disease at the right carotid bifurcation. 25% diameter stenosis proximal right internal carotid artery. Left carotid system: Atherosclerotic calcification left carotid bifurcation without stenosis. Vertebral arteries: Right vertebral artery dominant and without stenosis. Moderate to severe stenosis at the origin of the left vertebral artery. Left vertebral artery is then patent to the basilar without additional stenosis. Skeleton: No acute abnormality. Other  neck: Negative for mass or adenopathy. Upper chest: Lung apices clear bilaterally. Review of the MIP images confirms the above findings CTA HEAD FINDINGS Anterior circulation: Atherosclerotic calcification throughout the cavernous carotid bilaterally with mild to moderate stenosis bilaterally. Anterior and middle cerebral arteries patent bilaterally without large vessel occlusion or flow limiting stenosis. Posterior circulation: Both vertebral arteries patent to the basilar. PICA patent bilaterally. Basilar widely patent. Mild atherosclerotic irregularity in the basilar. AICA, superior cerebellar, and posterior cerebral arteries patent bilaterally without large vessel occlusion. Moderate stenosis right P2 segment. Left posterior cerebral artery patent without stenosis. Venous sinuses: Normal venous enhancement. Anatomic variants: None Review of the MIP images confirms the above findings IMPRESSION: 1. Negative for intracranial large vessel occlusion 2. 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 Mild to moderate atherosclerotic stenosis in the supraclinoid internal carotid artery bilaterally. 3. 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 Right vertebral artery widely patent 4. Moderate stenosis right P2 segment. 5. These results were called by telephone at the time of interpretation on 01/09/2020 at 1:15 pm to provider MCNEILL [facility] , who verbally acknowledged these results. Electronically Signed   By: Franchot Gallo M.D.   On: 01/09/2020 13:16   MR BRAIN WO CONTRAST  Result Date: 01/09/2020 CLINICAL DATA:  79 year old female code stroke presentation this morning. Headache and vomiting. EXAM: MRI HEAD WITHOUT CONTRAST TECHNIQUE: Multiplanar, multiecho pulse sequences of the brain and surrounding structures were obtained without intravenous contrast. COMPARISON:  CT head and CTA head and neck earlier today. Brain MRI 06/01/2018. FINDINGS:  Brain: Patchy 3 cm area of restricted diffusion in  the right superior cerebellar artery territory (series 3, image 14). Minimal T2 and FLAIR hyperintensity. No associated hemorrhage or mass effect. Superimposed chronic microhemorrhage elsewhere in the right cerebellar hemisphere. No other convincing posterior fossa restricted diffusion. Expected evolution of the posterior right frontal lobe/posterior operculum infarct last year with encephalomalacia there now. No supratentorial restricted diffusion today. Elsewhere stable gray and white matter signal including Patchy and confluent bilateral cerebral white matter and pontine T2 and FLAIR hyperintensity.-no other chronic cerebral blood products. No midline shift, mass effect, evidence of mass lesion, ventriculomegaly, extra-axial collection or acute intracranial hemorrhage. Cervicomedullary junction and pituitary are within normal limits. Vascular: Major intracranial vascular flow voids are stable since last year. Skull and upper cervical spine: Negative for age visible cervical spine. Visualized bone marrow signal is within normal limits. Sinuses/Orbits: Stable, negative. Other: Mastoids remain clear. Grossly stable and normal visible internal auditory structures. Visible scalp and face appear negative. IMPRESSION: 1. Acute 3 cm infarct in the Right Superior Cerebellar Artery territory. No associated hemorrhage or mass effect. 2. Expected evolution of the right MCA infarcts last year. Otherwise stable underlying chronic small vessel disease. Electronically Signed   By: Genevie Ann M.D.   On: 01/09/2020 15:42   MYOCARDIAL PERFUSION IMAGING  Result Date: 01/09/2020 Please see documentation in chart; this is a non-gated rest-only study. Patient had rest images completed, which are without perfusion defects. Prior to acquisition of stress images, patient had event concerning for acute stroke and was urgently transported to the hospital via EMS.  CT HEAD CODE STROKE WO  CONTRAST  Result Date: 01/09/2020 CLINICAL DATA:  Code stroke. Acute neuro deficit. Headache and vomiting. EXAM: CT HEAD WITHOUT CONTRAST TECHNIQUE: Contiguous axial images were obtained from the base of the skull through the vertex without intravenous contrast. COMPARISON:  05/31/2018 FINDINGS: Brain: Chronic microvascular ischemic change in the white matter. Chronic infarct in the right frontal parietal cortex. Negative for acute infarct, hemorrhage, mass Vascular: Atherosclerotic calcification in the carotid and vertebral arteries. Negative for hyperdense vessel Skull: Negative Sinuses/Orbits: Paranasal sinuses clear. Left cataract extraction. No orbital lesion. Other: None ASPECTS ([place] Stroke Program Early CT Score) - Ganglionic level infarction (caudate, lentiform nuclei, internal capsule, insula, M1-M3 cortex): 7 - Supraganglionic infarction (M4-M6 cortex): 3 Total score (0-10 with 10 being normal): 10 IMPRESSION: 1. No acute abnormality 2. ASPECTS is 10 3. Chronic infarct right frontal parietal cortex. Chronic microvascular ischemic change in the white matter. 4. Code stroke imaging results were communicated on 01/09/2020 at 1:01 pm to provider Dr. Leonel Ramsay via Shea Evans Electronically Signed   By: Franchot Gallo M.D.   On: 01/09/2020 13:02    PHYSICAL EXAM Pleasant elderly Caucasian lady not in distress. . Afebrile. Head is nontraumatic. Neck is supple without bruit.    Cardiac exam no murmur or gallop. Lungs are clear to auscultation. Distal pulses are well felt. Neurological Exam :  Awake  Alert oriented x 3. Normal speech and language.eye movements full without nystagmus.fundi were not visualized. Vision acuity and fields appear normal. Hearing is normal. Palatal movements are normal. Face symmetric. Tongue midline. Normal strength, tone, reflexes and coordination.  Diminished left body sensation. Gait deferred.  ASSESSMENT/PLAN [name] is a 79 y.o. female with history  of previous stroke w/ L sided deficits, AF on Eliquis, HTN, HLD presenting with slurred speech and facial droop.   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 Old  infarct R frontoparietal cortex. Small vessel disease. ASPECTS 10.     CTA head & neck no LVO. B ICA bifurcation atherosclerosis, supraclinoid B ICA atherosclerosis. L VA origin moderate to severe stenosis. R P2 moderate stenosis.   MRI  R superior cerebellar territory infarct. Evolution of prior R MCA infarcts from last year. Small vessel disease.   2D Echo EF 60-65%. No source of embolus. LA severely dilated.  LDL 81  HgbA1c 5.9  VTE prophylaxis - Lovenox 40 mg sq daily   Eliquis (apixaban) daily prior to admission, now on aspirin 81 mg daily. Will add aspirin suppository option. Discussed change to pradaxa w/ pt and husband. Will check cost w/ TOC. For now, aspirin.  Therapy recommendations:  pending   Disposition:  pending   Atrial Fibrillation  Home anticoagulation:  Eliquis (apixaban) daily   lovenox ordered in the hospital - ok for DVT dosing  Resume oral AC once able to swallow  Discussed change to pradaxa w/ pt and husband. Will check cost w/ TOC.  Hypertension  Stable . Permissive hypertension (OK if < 220/120) but gradually normalize in 5-7 days . Long-term BP goal normotensive  Hyperlipidemia  Home meds:  lipitor 40  Now on lipitor 80    LDL 81, goal < 70  Continue statin at discharge  Dysphagia . Secondary to stroke . NPO . 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 D/c to CIR on ASA, plavix, lipitor. Plan for 30d monitor given hx palpitations (did show AF) and changed to Eliquis. Residual L HP.  Other Active Problems  Post-stroke anxiety and paresthesias, developed HTN on Cymbalta so stopped.  Hospital day # 1 She presented with embolic right  cerebellar infarct due to A. fib despite being on anticoagulation with Eliquis.  I had a long discussion with the patient and her husband regarding alternative treatment options including other NOACs as well as warfarin however there is lack of definitive data suggesting switching Eliquis to alternative agent is necessarily superior and moreover this may lead to increased co-pay.  We will ask case manager to check into her co-pay for Pradaxa if it is not significantly higher she might switch otherwise recommend continue Eliquis.  Continue ongoing stroke work-up.  Therapy evaluation.  Discussed with Dr. [place] Pink and patient and husband.  Greater than 50% time during the 35-minute visit was spent in counseling and coordination of care about her embolic stroke and discussion about anticoagulation options and answering questions. Antony Contras, MD  To contact Stroke Continuity provider, please refer to [email or web address] After hours, contact General Neurology

## 2020-01-10 NOTE — ED Notes (Signed)
Attempted NGT w 2nd RN, pt had epistaxis, had to apply pressure and give Afrin to stop bleeding. MD made aware and said he would come see pt shortly

## 2020-01-10 NOTE — Progress Notes (Signed)
Pt admitted to the unit from ED. Pt arrived to the unit from ED via bed with spouse and belongings to the side. Pt and spouse oriented to the unit and room, fall/safety precaution and prevention education completed. Telemetry applied and verified with CCMD, NT called to second verify, VSS, skin intact with no pressure or opened wound noted. Pt assisted to [facility] x1 to void, Pt in bed with call light within reach and bed alarm on. Spouse remains at bedside. Will continue to closely monitor and report off to oncoming RN. PDarden Palmer Toshiro Hanken RN   01/10/20 1608  Vitals  Temp 98.1 F (36.7 C)  Temp Source Oral  BP (!) 153/90  MAP (mmHg) 109  BP Location Right Arm  BP Method Automatic  Patient Position (if appropriate) Lying  Pulse Rate 83  Pulse Rate Source Dinamap  Resp 16  Level of Consciousness  Level of Consciousness Alert  MEWS COLOR  MEWS Score Color Green  Oxygen Therapy  SpO2 96 %  O2 Device Room Air  Pain Assessment  Pain Scale 0-10  Pain Score 0  PCA/Epidural/Spinal Assessment  Respiratory Pattern Regular;Unlabored  ECG Monitoring  Cardiac Rhythm NSR  Tele Box Verification Completed by Second Verifier Completed  MEWS Score  MEWS Temp 0  MEWS Systolic 0  MEWS Pulse 0  MEWS RR 0  MEWS LOC 0  MEWS Score 0

## 2020-01-10 NOTE — ED Notes (Signed)
Dr. Cyd Silence in room speaking w pt

## 2020-01-10 NOTE — Evaluation (Signed)
Clinical/Bedside Swallow Evaluation Patient Details  Name: Mary Mercado MRN: [id number] Date of Birth: Jul 20, 1940  Today's Date: 01/10/2020 Time: SLP Start Time (ACUTE ONLY): 96 SLP Stop Time (ACUTE ONLY): 1517 SLP Time Calculation (min) (ACUTE ONLY): 14 min  Past Medical History:  Past Medical History:  Diagnosis Date  . Allergy   . CVA (cerebral vascular accident) ([place])   . Hyperlipidemia LDL goal <70   . Hypertension   . Palpitations   . Vertigo, benign positional    Past Surgical History:  Past Surgical History:  Procedure Laterality Date  . CARDIOVERSION Left 10/31/2018   Procedure: CARDIOVERSION;  Surgeon: Acie Fredrickson Wonda Cheng, MD;  Location: [place];  Service: Cardiovascular;  Laterality: Left;  . CARDIOVERSION N/A 01/08/2020   Procedure: CARDIOVERSION;  Surgeon: [name], MD;  Location[facility] ENDOSCOPY;  Service: Cardiovascular;  Laterality: N/A;  . Hemilaminectomy and microdiskectomy at L4-5 on the left.  12/10/2003  . TEE WITHOUT CARDIOVERSION N/A 06/05/2018   Procedure: TRANSESOPHAGEAL ECHOCARDIOGRAM (TEE);  Surgeon: Lelon Perla, MD;  [facility] END[facility]COPY;  Service: Cardiovascular;  Laterality: N/A;  loop   HPI:  79 y.o. female with medical history significant of CAD in 2020, paroxysmal A. fib on Eliquis, hypertension, HLD, presented with new onset of vertigo and ataxia. 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     Assessment / Plan / Recommendation Clinical Impression  Pt presents with functional oropharyngeal swallow.  Speech is mildly dysarthric.  Oral mechanism exam normal.  Pt consumed a sandwich, applesauce, and multiple successive boluses of thin liquid (4 oz) with normal mastication, brisk swallow, and no s/s of aspiration. Resume a  regular diet, thin liquids.  No SLP f/u needed for swallowing.  Speech/language evaluation pending. D/W RN.  SLP Visit Diagnosis: Dysphagia, unspecified (R13.10)     Aspiration Risk  No limitations    Diet Recommendation   regular solids, thin liquids  Medication Administration: Whole meds with liquid    Other  Recommendations Oral Care Recommendations: Oral care BID   Follow up Recommendations None        Swallow Study   General HPI: 79 y.o. female with medical history significant of CAD in 2020, paroxysmal A. fib on Eliquis, hypertension, HLD, presented with new onset of vertigo and ataxia. 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   Type of Study: Bedside Swallow Evaluation Previous Swallow Assessment: no Diet Prior to this Study: NPO Temperature Spikes Noted: No Respiratory Status: Room air History of Recent Intubation: No Behavior/Cognition: Alert;Cooperative Oral Cavity Assessment: Within Functional Limits Oral Care Completed by SLP: Recent completion by staff Oral Cavity - Dentition: Adequate natural dentition Vision: Functional for self-feeding Self-Feeding Abilities: Able to feed self Patient Positioning: Upright in bed Baseline Vocal Quality: Normal Volitional Cough: Strong Volitional Swallow: Able to elicit    Oral/Motor/Sensory Function Overall Oral Motor/Sensory Function: Within functional limits   Ice Chips Ice chips: Within functional limits   Thin Liquid Thin Liquid: Within functional limits    Nectar Thick Nectar Thick Liquid: Not tested   Honey Thick Honey Thick Liquid: Not tested   Puree Puree: Within functional limits   Solid     Solid: Within functional limits     Mary Mercado L. Tivis Ringer, [place] Office number [phone] Pager [phone]  Mary Mercado 01/10/2020,3:22 PM

## 2020-01-10 NOTE — Progress Notes (Addendum)
HOSPITAL MEDICINE OVERNIGHT EVENT NOTE    NGT placement unsuccessful due to patient experiencing brisk epistaxis.   Conservative measures with providing 2 prays of Afrin, pinching the nose and leaning forward were successful in stopping the bleeding.  Patient refusing to try another NGT placement attempt.  Will simply keep NPO except for small sips of clears until the AM speech therapy swallow eval.   Monitoring closely on telemetry.  If patient does happen to go into afib will place on IV rate controlling therapy.  Mary Emerald  MD [facility]   Please also see the remainder of my notes for the evening on 9/16 at 9:38PM

## 2020-01-10 NOTE — ED Notes (Signed)
Dr. Leonie Man at bedside.

## 2020-01-10 NOTE — Progress Notes (Addendum)
[facility] PROGRESS NOTE   Mary Mercado CWC:[id number] DOB: November 10, 1940 DOA: 01/09/2020  PCP: [name], MD  Brief History/Interval Summary: 79 y.o. female with medical history significant of CAD in 2020, paroxysmal A. fib on Eliquis, hypertension, HLD, presented with new onset of vertigo and ataxia.  Was at her cardiologist office this morning for pharmacological stress test, and suddenly developed ataxia, slurred speech and a feeling of vertigo.  Patient has been taking Eliquis 5 mg twice daily, and never missed a dose.  Patient did not complain specifically one-sided weakness or numbness, no vision changes or headache no hearing changes.  Patient was feeling nausea on arrival in the ED which subsided.  Patient also described that " whole body was spinning around me" and has to close her eyes to ease these symptoms.  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  Moderate stenosis of the right P2 segment.  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  Patient continues to have difficulty speaking.  She also tells me that she feels extremely daily when she turns her head to a particular side.  Also mentioning some shaking of her right shoulder area.  Denies any headaches.  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  CT angiogram was done.  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 Right vertebral artery widely patent.  Neurology consulted.   Patient noted to have  tremors involving her right shoulder area.  Likely related to her acute stroke. Patient on Eliquis at home which is currently on hold.  Patient noted to be on aspirin. Lipitor.   Echocardiogram is pending. Allow permissive hypertension Urine drug screen unremarkable LDL 81.  HbA1c 5.9. PT and OT evaluation.  Questionable dysphagia Apparently patient initially failed her swallow screen and subsequently passed it.  Attempts were made to place NG tube which resulted in epistaxis.  Epistaxis has resolved.  Currently without NG tube.  Waiting on SLP to evaluate patient this morning.  Paroxysmal atrial fibrillation On Eliquis, flecainide and diltiazem at home.  These are being held currently.  Essential hypertension Allowing permissive hypertension.  Noted to be on Micardis and diltiazem at home.  Currently on hold  History of hyperlipidemia Patient on Lipitor at home.  LDL is 81.  Will increase the dose of Lipitor to 80 mg.  Hyperglycemia Not a fasting level.  HbA1c 5.9.  Acute urinary retention Patient noted to have more than 800 mL of urine in her bladder.  She was only able to void about 50 mL.  In and out catheterization was done.  Discussed with nursing staff.  Bladder scans will be done frequently.  If she continues to have retention may have to place a Foley.  UA reviewed.  No evidence for infection.   DVT Prophylaxis: SCDs.  If she has no further epistaxis Lovenox can be initiated from tomorrow.  Will also depend on what the plan will be regarding her anticoagulation. Code Status: Full code Family Communication: No family at bedside Disposition Plan: Depending on PT  and OT evaluation  Status is: Inpatient  Remains inpatient appropriate because:Ongoing diagnostic testing needed not appropriate for outpatient work up and IV treatments appropriate due to intensity of illness or inability to take PO   Dispo: The patient is from: Home              Anticipated d/c is to: To be  determined              Anticipated d/c date is: 2 days              Patient currently is not medically stable to d/c.      Medications:  Scheduled: .  stroke: mapping our early stages of recovery book   Does not apply Once  . aspirin EC  81 mg Oral Daily  . atorvastatin  40 mg Oral q1800  . diltiazem  60 mg Per Tube Q8H  . flecainide  50 mg Per Tube BID   Continuous: . sodium chloride 100 mL/hr at 01/10/20 0549   ERX:VQMGQQP carbonate, famotidine, meclizine, prochlorperazine, senna-docusate   Objective:  Vital Signs  Vitals:   01/10/20 0300 01/10/20 0400 01/10/20 0500 01/10/20 0900  BP: 138/65 (!) 153/104 (!) 121/97 (!) 142/66  Pulse: 66 83 79 74  Resp: 18 14 (!) 21 (!) 22  Temp:      TempSrc:      SpO2: 98% 95% 95% 95%    Intake/Output Summary (Last 24 hours) at 01/10/2020 0955 Last data filed at 01/10/2020 0940 Gross per 24 hour  Intake --  Output 1450 ml  Net -1450 ml   There were no vitals filed for this visit.  General appearance: Awake alert.  In no distress.  Noted to be mildly distracted. Resp: Clear to auscultation bilaterally.  Normal effort Cardio: S1-S2 is normal regular.  No S3-S4.  No rubs murmurs or bruit GI: Abdomen is soft.  Nontender nondistended.  Bowel sounds are present normal.  No masses organomegaly Extremities: No edema.  Full range of motion of lower extremities. Neurologic: She is awake alert.  Oriented to place year.  Nystagmus noted on rightward gaze.  Mild facial asymmetry noted with left-sided facial droop.  No obvious pronator drift identified.   Lab Results:  Data Reviewed: I have personally reviewed following labs and imaging studies  CBC: Recent Labs  Lab 01/09/20 1248 01/09/20 1301  WBC 7.7  --   NEUTROABS 4.9  --   HGB 13.5 13.9  HCT 42.1 41.0  MCV 92.5  --   PLT 262  --     Basic Metabolic Panel: Recent Labs  Lab 01/09/20 1301 01/09/20 1325  NA 137 136  K 3.6 3.5  CL 103 101  CO2  --  24  GLUCOSE 168*  209*  BUN 9 7*  CREATININE 0.60 0.76  CALCIUM  --  8.8*    GFR: Estimated Creatinine Clearance: 50.5 mL/min (by C-G formula based on SCr of 0.76 mg/dL).  Liver Function Tests: Recent Labs  Lab 01/09/20 1325  AST 35  ALT 29  ALKPHOS 79  BILITOT 1.0  PROT 6.4*  ALBUMIN 3.9    Coagulation Profile: Recent Labs  Lab 01/09/20 1248  INR 1.3*    HbA1C: Recent Labs    01/10/20 0550  HGBA1C 5.9*    CBG: Recent Labs  Lab 01/09/20 1241  GLUCAP 176*    Lipid Profile: Recent Labs    01/10/20 0550  CHOL 183  HDL 92  LDLCALC 81  TRIG 52  CHOLHDL 2.0     Recent Results (from the past 240 hour(s))  SARS CORONAVIRUS 2 (TAT 6-24 HRS) Nasopharyngeal Nasopharyngeal Swab     Status: None   Collection Time: 01/04/20  1:56 PM   Specimen: Nasopharyngeal Swab  Result Value Ref Range Status   SARS Coronavirus 2 NEGATIVE NEGATIVE Final    Comment: (NOTE) SARS-CoV-2 target nucleic acids are NOT DETECTED.  The SARS-CoV-2 RNA is generally detectable in upper and lower respiratory specimens during the acute phase of infection. Negative results do not preclude SARS-CoV-2 infection, do not rule out co-infections with other pathogens, and should not be used as the sole basis for treatment or other patient management decisions. Negative results must be combined with clinical observations, patient history, and epidemiological information. The expected result is Negative.  Fact Sheet for Patients: SugarRoll.be  Fact Sheet for Healthcare Providers: https://www.woods-mathews.com/  This test is not yet approved or cleared by the [place] FDA and  has been authorized for detection and/or diagnosis of SARS-CoV-2 by FDA under an Emergency Use Authorization (EUA). This EUA will remain  in effect (meaning this test can be used) for the duration of the COVID-19 declaration under Se ction 564(b)(1) of the Act, 21 U.S.C. section  360bbb-3(b)(1), unless the authorization is terminated or revoked sooner.  Performed at [facility], [place] [address] [place], [place] [address]   SARS Coronavirus 2 by RT PCR (hospital order, performed in [facility] hospital lab) Nasopharyngeal Nasopharyngeal Swab     Status: None   Collection Time: 01/09/20  5:00 PM   Specimen: Nasopharyngeal Swab  Result Value Ref Range Status   SARS Coronavirus 2 NEGATIVE NEGATIVE Final    Comment: (NOTE) SARS-CoV-2 target nucleic acids are NOT DETECTED.  The SARS-CoV-2 RNA is generally detectable in upper and lower respiratory specimens during the acute phase of infection. The lowest concentration of SARS-CoV-2 viral copies this assay can detect is 250 copies / mL. A negative result does not preclude SARS-CoV-2 infection and should not be used as the sole basis for treatment or other patient management decisions.  A negative result may occur with improper specimen collection / handling, submission of specimen other than nasopharyngeal swab, presence of viral mutation(s) within the areas targeted by this assay, and inadequate number of viral copies (<250 copies / mL). A negative result must be combined with clinical observations, patient history, and epidemiological information.  Fact Sheet for Patients:   StrictlyIdeas.no  Fact Sheet for Healthcare Providers: BankingDealers.co.za  This test is not yet approved or  cleared by the [place] FDA and has been authorized for detection and/or diagnosis of SARS-CoV-2 by FDA under an Emergency Use Authorization (EUA).  This EUA will remain in effect (meaning this test can be used) for the duration of the COVID-19 declaration under Section 564(b)(1) of the Act, 21 U.S.C. section 360bbb-3(b)(1), unless the authorization is terminated or revoked sooner.  Performed at [facility], [place] [address], [place], [place] [address]        Radiology Studies: CT Code Stroke CTA Head W/WO contrast  Result Date: 01/09/2020 CLINICAL DATA:  Focal neuro deficit.  Dizziness. EXAM: CT ANGIOGRAPHY HEAD AND NECK TECHNIQUE: Multidetector CT imaging of the head and neck was performed using the standard protocol during bolus administration of intravenous contrast. Multiplanar CT image reconstructions and MIPs were obtained to evaluate the vascular anatomy. Carotid stenosis measurements (when applicable) are obtained utilizing NASCET criteria, using the distal internal carotid diameter as the denominator.  CONTRAST:  58mL OMNIPAQUE IOHEXOL 350 MG/ML SOLN 75 mL Omnipaque 350 IV COMPARISON:  CT head 01/09/2020 FINDINGS: CTA NECK FINDINGS Aortic arch: Atherosclerotic calcification aortic arch. Atherosclerotic calcification in the great vessels especially left subclavian artery without significant stenosis. Right carotid system: Atherosclerotic disease at the right carotid bifurcation. 25% diameter stenosis proximal right internal carotid artery. Left carotid system: Atherosclerotic calcification left carotid bifurcation without stenosis. Vertebral arteries: Right vertebral artery dominant and without stenosis. Moderate to severe stenosis at the origin of the left vertebral artery. Left vertebral artery is then patent to the basilar without additional stenosis. Skeleton: No acute abnormality. Other neck: Negative for mass or adenopathy. Upper chest: Lung apices clear bilaterally. Review of the MIP images confirms the above findings CTA HEAD FINDINGS Anterior circulation: Atherosclerotic calcification throughout the cavernous carotid bilaterally with mild to moderate stenosis bilaterally. Anterior and middle cerebral arteries patent bilaterally without large vessel occlusion or flow limiting stenosis. Posterior circulation: Both vertebral arteries patent to the basilar. PICA patent bilaterally. Basilar widely patent. Mild atherosclerotic irregularity in the  basilar. AICA, superior cerebellar, and posterior cerebral arteries patent bilaterally without large vessel occlusion. Moderate stenosis right P2 segment. Left posterior cerebral artery patent without stenosis. Venous sinuses: Normal venous enhancement. Anatomic variants: None Review of the MIP images confirms the above findings IMPRESSION: 1. Negative for intracranial large vessel occlusion 2. 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 Mild to moderate atherosclerotic stenosis in the supraclinoid internal carotid artery bilaterally. 3. 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 Right vertebral artery widely patent 4. Moderate stenosis right P2 segment. 5. These results were called by telephone at the time of interpretation on 01/09/2020 at 1:15 pm to provider MCNEILL [facility] , who verbally acknowledged these results. Electronically Signed   By: Franchot Gallo M.D.   On: 01/09/2020 13:16   CT Code Stroke CTA Neck W/WO contrast  Result Date: 01/09/2020 CLINICAL DATA:  Focal neuro deficit.  Dizziness. EXAM: CT ANGIOGRAPHY HEAD AND NECK TECHNIQUE: Multidetector CT imaging of the head and neck was performed using the standard protocol during bolus administration of intravenous contrast. Multiplanar CT image reconstructions and MIPs were obtained to evaluate the vascular anatomy. Carotid stenosis measurements (when applicable) are obtained utilizing NASCET criteria, using the distal internal carotid diameter as the denominator. CONTRAST:  4mL OMNIPAQUE IOHEXOL 350 MG/ML SOLN 75 mL Omnipaque 350 IV COMPARISON:  CT head 01/09/2020 FINDINGS: CTA NECK FINDINGS Aortic arch: Atherosclerotic calcification aortic arch. Atherosclerotic calcification in the great vessels especially left subclavian artery without significant stenosis. Right carotid system: Atherosclerotic disease at the right carotid bifurcation. 25% diameter stenosis proximal right  internal carotid artery. Left carotid system: Atherosclerotic calcification left carotid bifurcation without stenosis. Vertebral arteries: Right vertebral artery dominant and without stenosis. Moderate to severe stenosis at the origin of the left vertebral artery. Left vertebral artery is then patent to the basilar without additional stenosis. Skeleton: No acute abnormality. Other neck: Negative for mass or adenopathy. Upper chest: Lung apices clear bilaterally. Review of the MIP images confirms the above findings CTA HEAD FINDINGS Anterior circulation: Atherosclerotic calcification throughout the cavernous carotid bilaterally with mild to moderate stenosis bilaterally. Anterior and middle cerebral arteries patent bilaterally without large vessel occlusion or flow limiting stenosis. Posterior circulation: Both vertebral arteries patent to the basilar. PICA patent bilaterally. Basilar widely patent. Mild atherosclerotic irregularity in the basilar. AICA, superior cerebellar, and posterior cerebral arteries patent bilaterally without large vessel occlusion. Moderate stenosis right P2 segment. Left  posterior cerebral artery patent without stenosis. Venous sinuses: Normal venous enhancement. Anatomic variants: None Review of the MIP images confirms the above findings IMPRESSION: 1. Negative for intracranial large vessel occlusion 2. 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 Mild to moderate atherosclerotic stenosis in the supraclinoid internal carotid artery bilaterally. 3. 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 Right vertebral artery widely patent 4. Moderate stenosis right P2 segment. 5. These results were called by telephone at the time of interpretation on 01/09/2020 at 1:15 pm to provider MCNEILL [facility] , who verbally acknowledged these results. Electronically Signed   By: Franchot Gallo M.D.   On: 01/09/2020 13:16   MR BRAIN WO  CONTRAST  Result Date: 01/09/2020 CLINICAL DATA:  79 year old female code stroke presentation this morning. Headache and vomiting. EXAM: MRI HEAD WITHOUT CONTRAST TECHNIQUE: Multiplanar, multiecho pulse sequences of the brain and surrounding structures were obtained without intravenous contrast. COMPARISON:  CT head and CTA head and neck earlier today. Brain MRI 06/01/2018. FINDINGS: Brain: Patchy 3 cm area of restricted diffusion in the right superior cerebellar artery territory (series 3, image 14). Minimal T2 and FLAIR hyperintensity. No associated hemorrhage or mass effect. Superimposed chronic microhemorrhage elsewhere in the right cerebellar hemisphere. No other convincing posterior fossa restricted diffusion. Expected evolution of the posterior right frontal lobe/posterior operculum infarct last year with encephalomalacia there now. No supratentorial restricted diffusion today. Elsewhere stable gray and white matter signal including Patchy and confluent bilateral cerebral white matter and pontine T2 and FLAIR hyperintensity.-no other chronic cerebral blood products. No midline shift, mass effect, evidence of mass lesion, ventriculomegaly, extra-axial collection or acute intracranial hemorrhage. Cervicomedullary junction and pituitary are within normal limits. Vascular: Major intracranial vascular flow voids are stable since last year. Skull and upper cervical spine: Negative for age visible cervical spine. Visualized bone marrow signal is within normal limits. Sinuses/Orbits: Stable, negative. Other: Mastoids remain clear. Grossly stable and normal visible internal auditory structures. Visible scalp and face appear negative. IMPRESSION: 1. Acute 3 cm infarct in the Right Superior Cerebellar Artery territory. No associated hemorrhage or mass effect. 2. Expected evolution of the right MCA infarcts last year. Otherwise stable underlying chronic small vessel disease. Electronically Signed   By: Genevie Ann M.D.    On: 01/09/2020 15:42   MYOCARDIAL PERFUSION IMAGING  Result Date: 01/09/2020 Please see documentation in chart; this is a non-gated rest-only study. Patient had rest images completed, which are without perfusion defects. Prior to acquisition of stress images, patient had event concerning for acute stroke and was urgently transported to the hospital via EMS.  CT HEAD CODE STROKE WO CONTRAST  Result Date: 01/09/2020 CLINICAL DATA:  Code stroke. Acute neuro deficit. Headache and vomiting. EXAM: CT HEAD WITHOUT CONTRAST TECHNIQUE: Contiguous axial images were obtained from the base of the skull through the vertex without intravenous contrast. COMPARISON:  05/31/2018 FINDINGS: Brain: Chronic microvascular ischemic change in the white matter. Chronic infarct in the right frontal parietal cortex. Negative for acute infarct, hemorrhage, mass Vascular: Atherosclerotic calcification in the carotid and vertebral arteries. Negative for hyperdense vessel Skull: Negative Sinuses/Orbits: Paranasal sinuses clear. Left cataract extraction. No orbital lesion. Other: None ASPECTS ([place] Stroke Program Early CT Score) - Ganglionic level infarction (caudate, lentiform nuclei, internal capsule, insula, M1-M3 cortex): 7 - Supraganglionic infarction (M4-M6 cortex): 3 Total score (0-10 with 10 being normal): 10 IMPRESSION: 1. No acute abnormality 2. ASPECTS is 10 3. Chronic infarct right frontal parietal cortex. Chronic microvascular ischemic change  in the white matter. 4. Code stroke imaging results were communicated on 01/09/2020 at 1:01 pm to provider Dr. Leonel Ramsay via [facility] Electronically Signed   By: Franchot Gallo M.D.   On: 01/09/2020 13:02       LOS: 1 day   [facility] Pager on [email or web address]  01/10/2020, 9:55 AM

## 2020-01-11 DIAGNOSIS — E785 Hyperlipidemia, unspecified: Secondary | ICD-10-CM

## 2020-01-11 LAB — COMPREHENSIVE METABOLIC PANEL
ALT: 30 U/L (ref 0–44)
AST: 23 U/L (ref 15–41)
Albumin: 3.7 g/dL (ref 3.5–5.0)
Alkaline Phosphatase: 83 U/L (ref 38–126)
Anion gap: 12 (ref 5–15)
BUN: 7 mg/dL — ABNORMAL LOW (ref 8–23)
CO2: 24 mmol/L (ref 22–32)
Calcium: 8.8 mg/dL — ABNORMAL LOW (ref 8.9–10.3)
Chloride: 101 mmol/L (ref 98–111)
Creatinine, Ser: 0.68 mg/dL (ref 0.44–1.00)
GFR calc Af Amer: >60 mL/min (ref >60)
GFR calc non Af Amer: >60 mL/min (ref >60)
Glucose, Bld: 125 mg/dL — ABNORMAL HIGH (ref 70–99)
Potassium: 3.4 mmol/L — ABNORMAL LOW (ref 3.5–5.1)
Sodium: 137 mmol/L (ref 135–145)
Total Bilirubin: 1.4 mg/dL — ABNORMAL HIGH (ref 0.3–1.2)
Total Protein: 6.5 g/dL (ref 6.5–8.1)

## 2020-01-11 LAB — CBC
HCT: 43 % (ref 36.0–46.0)
Hemoglobin: 14 g/dL (ref 12.0–15.0)
MCH: 29.8 pg (ref 26.0–34.0)
MCHC: 32.6 g/dL (ref 30.0–36.0)
MCV: 91.5 fL (ref 80.0–100.0)
Platelets: 218 10*3/uL (ref 150–400)
RBC: 4.7 MIL/uL (ref 3.87–5.11)
RDW: 13 % (ref 11.5–15.5)
WBC: 9.2 10*3/uL (ref 4.0–10.5)
nRBC: 0 % (ref 0.0–0.2)

## 2020-01-11 MED ORDER — POTASSIUM CHLORIDE CRYS ER 20 MEQ PO TBCR
40.0000 meq | EXTENDED_RELEASE_TABLET | Freq: Once | ORAL | Status: AC
  Administered 2020-01-11: 40 meq via ORAL
  Filled 2020-01-11: qty 2

## 2020-01-11 NOTE — Evaluation (Signed)
Occupational Therapy Evaluation Patient Details Name: Mary Mercado MRN: [id number] DOB: 02-24-1941 Today's Date: 01/11/2020    History of Present Illness 79 yo female with onset of ataxia, vertigo and slurred speech was admitted and noted R superior cerebellar artery infarct.  Additionally has urinary retention.  PMHx:  cardioversion, CVA, HTN, palpitations, vertigo,    Clinical Impression   PTA, pt was living with her husband and was independent; enjoys going to ball games and singing in choir. Pt current requiring Min A for UB ADLs, Min-Mod A for LB ADLs, and Min A for short distance mobility. Due to dizziness, pt requiring increased time and cues throughout to focus on certain objects and decease dizziness (report she feels as if she is spinning; no noted nystagmus). Pt very motivated and has good family support. Pt would benefit from further acute OT to facilitate safe dc. Due to PLOF, high motivation, good support, and current functional status, recommend dc to CIR for further OT to optimize safety, independence with ADLs, and return to PLOF.     Follow Up Recommendations  CIR    Equipment Recommendations  Other (comment) (Defer to next venue)    Recommendations for Other Services Rehab consult;PT consult;Speech consult     Precautions / Restrictions Precautions Precautions: Fall Precaution Comments: dizzy with eyes open, struggling to sit up      Mobility Bed Mobility Overal bed mobility: Needs Assistance Bed Mobility: Supine to Sit     Supine to sit: Min guard;HOB elevated     General bed mobility comments: Min Guard A for safety. HOB elevated.   Transfers Overall transfer level: Needs assistance Equipment used: 1 person hand held assist Transfers: Sit to/from Stand Sit to Stand: Min assist         General transfer comment: Min A for gaining balance in standing.     Balance Overall balance assessment: Needs assistance Sitting-balance support:  Feet supported Sitting balance-Leahy Scale: Fair     Standing balance support: Single extremity supported;During functional activity Standing balance-Leahy Scale: Poor                             ADL either performed or assessed with clinical judgement   ADL Overall ADL's : Needs assistance/impaired Eating/Feeding: Set up;Sitting   Grooming: Set up;Sitting   Upper Body Bathing: Min guard;Sitting   Lower Body Bathing: Minimal assistance;Sit to/from stand   Upper Body Dressing : Min guard;Sitting   Lower Body Dressing: Moderate assistance;Sit to/from stand   Toilet Transfer: Minimal assistance;Ambulation (single hand held support) Toilet Transfer Details (indicate cue type and reason): Min A for balance in standing.          Functional mobility during ADLs: Minimal assistance (single hald held A) General ADL Comments: Pt presenting with decreased cognition, balance, and coorindation impacting her safe performance of ADLs. Providing education on focusing at one objects to decrease dizziness.      Vision Baseline Vision/History: No visual deficits Patient Visual Report: No change from baseline Additional Comments: No nystagmus noted. Denies any diplopia and blurry vision     Perception     Praxis      Pertinent Vitals/Pain Pain Assessment: No/denies pain     Hand Dominance Right   Extremity/Trunk Assessment Upper Extremity Assessment Upper Extremity Assessment: LUE deficits/detail LUE Deficits / Details: Decreased grasp strength compared to R. Decreased coorindation. LUE Sensation: decreased light touch LUE Coordination: decreased gross motor;decreased fine motor  Lower Extremity Assessment Lower Extremity Assessment: Defer to PT evaluation LLE Deficits / Details: gross motor incoordination with control of hip knee and ankle LLE Sensation: decreased light touch LLE Coordination: decreased fine motor;decreased gross motor   Cervical / Trunk  Assessment Cervical / Trunk Assessment: Kyphotic   Communication Communication Communication: Expressive difficulties   Cognition Arousal/Alertness: Awake/alert Behavior During Therapy: Flat affect Overall Cognitive Status: Impaired/Different from baseline Area of Impairment: Problem solving;Awareness                           Awareness: Emergent Problem Solving: Slow processing;Requires verbal cues General Comments: Very pleasant. Slight flat affect and reporting nausea. Requiring increased time for processing. Will continue to follow acutely as admitted   General Comments  Husband, [name], present throughout session    Exercises     Shoulder Instructions      Home Living Family/patient expects to be discharged to:: Private residence Living Arrangements: Spouse/significant other Available Help at Discharge: Family;Available 24 hours/day Type of Home: House Home Access: Level entry     Home Layout: One level     Bathroom Shower/Tub: Occupational psychologist: Standard     Home Equipment: Environmental consultant - 2 wheels;Cane - single point;Shower seat          Prior Functioning/Environment Level of Independence: Independent        Comments: ADLs, IADLs, driving, enjoys singing and going ot basketball games        OT Problem List: Decreased strength;Decreased range of motion;Decreased activity tolerance;Impaired balance (sitting and/or standing);Decreased knowledge of use of DME or AE;Decreased knowledge of precautions;Decreased cognition;Decreased coordination;Decreased safety awareness      OT Treatment/Interventions: Self-care/ADL training;Therapeutic exercise;Energy conservation;DME and/or AE instruction;Therapeutic activities;Patient/family education    OT Goals(Current goals can be found in the care plan section) Acute Rehab OT Goals Patient Stated Goal: to walk and get home OT Goal Formulation: With patient Time For Goal Achievement:  01/25/20 Potential to Achieve Goals: Good  OT Frequency: Min 2X/week   Barriers to D/C:            Co-evaluation              AM-PAC OT "6 Clicks" Daily Activity     Outcome Measure Help from another person eating meals?: None Help from another person taking care of personal grooming?: A Little Help from another person toileting, which includes using toliet, bedpan, or urinal?: A Little Help from another person bathing (including washing, rinsing, drying)?: A Little Help from another person to put on and taking off regular upper body clothing?: A Little Help from another person to put on and taking off regular lower body clothing?: A Lot 6 Click Score: 18   End of Session Equipment Utilized During Treatment: Gait belt Nurse Communication: Mobility status;Other (comment) (slight nausea)  Activity Tolerance: Patient tolerated treatment well Patient left: in chair;with call bell/phone within reach;with family/visitor present  OT Visit Diagnosis: Unsteadiness on feet (R26.81);Other abnormalities of gait and mobility (R26.89);Muscle weakness (generalized) (M62.81)                Time: 1610-9604 OT Time Calculation (min): 21 min Charges:  OT General Charges $OT Visit: 1 Visit OT Evaluation $OT Eval Moderate Complexity: [place], OTR/L Acute Rehab Pager: [phone] Office: [place] 01/11/2020, 12:24 PM

## 2020-01-11 NOTE — PMR Pre-admission (Signed)
PMR Admission Coordinator Pre-Admission Assessment  Patient: Mary Mercado is an 79 y.o., female MRN: [id number] DOB: 1940-05-17 Height: 5\' 5"  (165.1 cm) Weight: 64.7 kg              Insurance Information HMO:     PPO:      PCP:      IPA:      80/20: yes     OTHER:  PRIMARY: Medicare Part A and B     Policy#: [id number]      Subscriber: Patient Medicare Part A and B   Policy#: [id number]      Subscriber: Pt.  Benefits: Phone #: passport one onlineName: 9/18 Eff. Date:Part A and B effective 01/01/2006Deduct: $4818HUD of Pocket Max: noneLife Max: none CIR:100%SNF:  Outpatient:80%Co-Pay: 20% Home Health:100%Co-Pay: none DME:80%Co-Pay: 20% Providers:pt choice SECONDARY: BCBS Supplement      Policy#: [id number]       The "Data Collection Information Summary" for patients in Inpatient Rehabilitation Facilities with attached "Privacy Act [place] Records" was provided and verbally reviewed with: Patient  Emergency Contact Information Contact Information    Name Relation Home Work Mobile   [name],[name] Spouse [phone]  [phone]     Current Medical History  Patient Admitting Diagnosis: CVA History of Present Illness: Mary Mercado is a 79 y.o. right-handed female with history of CAD, PAF on Eliquis, hypertension, hyperlipidemia as well as vertigo.  Per chart review patient lives with spouse in a one level home.  Independent driving prior to admission.  Presented 01/09/2019 while left-sided weakness and slurred speech.  Cranial CT scan negative.  Patient did not receive TPA.  CT angiogram of head and neck negative for intracranial large vessel occlusion.  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  No associated hemorrhage or mass-effect.  Echocardiogram with ejection fraction of 60 to 65% no wall motion abnormalities grade 2 diastolic dysfunction.  Admission  chemistries alcohol negative, glucose 209, BUN 7, urine drug screen negative.  Neurology follow-up currently maintained on aspirin for CVA prophylaxis as well as subcutaneous Lovenox for DVT prophylaxis.  Awaiting plan to resume Eliquis versus change to Pradaxa. PT notes BPPV during therapy sessions.   Tolerating a regular diet.  Therapy evaluations completed with recommendations of physical medicine rehab consult. Complete NIHSS TOTAL: 0 Glasgow Coma Scale Score: 15  Past Medical History  Past Medical History:  Diagnosis Date  . Allergy   . CVA (cerebral vascular accident) ([place])   . Hyperlipidemia LDL goal <70   . Hypertension   . Palpitations   . Vertigo, benign positional     Family History  family history includes Colon cancer in her father; Diabetes in her mother; Heart attack in her mother; Heart attack (age of onset: 69) in her brother.  Prior Rehab/Hospitalizations:  Has the patient had prior rehab or hospitalizations prior to admission? Yes  Has the patient had major surgery during 100 days prior to admission? No  Current Medications   Current Facility-Administered Medications:  .  atorvastatin (LIPITOR) tablet 80 mg, 80 mg, Oral, q1800, Bonnielee Haff, MD, 80 mg at 01/13/20 1806 .  calcium carbonate (TUMS - dosed in mg elemental calcium) chewable tablet 800 mg of elemental calcium, 800 mg of elemental calcium, Oral, Daily PRN, Bonnielee Haff, MD .  dabigatran (PRADAXA) capsule 150 mg, 150 mg, Oral, Q12H, Skeet Simmer, RPH, 150 mg at 01/13/20 2102 .  diltiazem (CARDIZEM CD) 24 hr capsule 120 mg, 120 mg, Oral,  Daily, Bonnielee Haff, MD, 120 mg at 01/13/20 8182 .  famotidine (PEPCID) tablet 10 mg, 10 mg, Oral, Daily PRN, Bonnielee Haff, MD .  flecainide (TAMBOCOR) tablet 50 mg, 50 mg, Oral, BID, Bonnielee Haff, MD, 50 mg at 01/13/20 2102 .  meclizine (ANTIVERT) tablet 12.5 mg, 12.5 mg, Oral, TID PRN, Wynetta Fines T, MD, 12.5 mg at 01/11/20 1754 .  prochlorperazine  (COMPAZINE) injection 10 mg, 10 mg, Intravenous, Q6H PRN, Shalhoub, Sherryll Burger, MD, 10 mg at 01/11/20 1611 .  senna-docusate (Senokot-S) tablet 1 tablet, 1 tablet, Oral, QHS PRN, Lequita Halt, MD  Facility-Administered Medications Ordered in Other Encounters:  .  regadenoson (LEXISCAN) injection SOLN 0.4 mg, 0.4 mg, Intravenous, Once, Buford Dresser, MD .  technetium tetrofosmin (TC-MYOVIEW) injection 32 millicurie, 32 millicurie, Intravenous, Once PRN, Buford Dresser, MD  Patients Current Diet:  Diet Order            Diet regular Room service appropriate? Yes with Assist; Fluid consistency: Thin  Diet effective now                 Precautions / Restrictions Precautions Precautions: Fall Precaution Comments: dizzy with eyes open, struggling to sit up Restrictions Weight Bearing Restrictions: No Other Position/Activity Restrictions: poor tolerance for upright sitting   Has the patient had 2 or more falls or a fall with injury in the past year?No  Prior Activity Level Community (5-7x/wk): Active, going out 3-5x a week  Prior Functional Level Prior Function Level of Independence: Independent Comments: ADLs, IADLs, driving, enjoys singing and going ot basketball games  Self Care: Did the patient need help bathing, dressing, using the toilet or eating?  Independent  Indoor Mobility: Did the patient need assistance with walking from room to room (with or without device)? Independent  Stairs: Did the patient need assistance with internal or external stairs (with or without device)? Independent  Functional Cognition: Did the patient need help planning regular tasks such as shopping or remembering to take medications? Independent  Home Assistive Devices / Equipment Home Equipment: Environmental consultant - 2 wheels, Cane - single point, Civil engineer, contracting  Prior Device Use: Indicate devices/aids used by the patient prior to current illness, exacerbation or injury? None of the  above  Current Functional Level Cognition  Arousal/Alertness: Awake/alert Overall Cognitive Status: Within Functional Limits for tasks assessed Orientation Level: Oriented X4 General Comments: Very pleasant. Slight flat affect and reporting nausea. Requiring increased time for processing. Will continue to follow acutely as admitted Attention: Focused, Sustained Focused Attention: Appears intact Sustained Attention: Appears intact Memory: Impaired Memory Impairment: Retrieval deficit, Decreased recall of new information (Immediate: 5/5; delayed: 4/5; with cues: 1/1) Awareness: Appears intact Problem Solving: Appears intact Executive Function: Reasoning, Sequencing, Organizing Reasoning: Appears intact Sequencing:  (Clock drawing: 4/4 with self- correction) Organizing: Impaired Organizing Impairment: Verbal complex (backward digit span: 1/3)    Extremity Assessment (includes Sensation/Coordination)  Upper Extremity Assessment: LUE deficits/detail LUE Deficits / Details: Decreased grasp strength compared to R. Decreased coorindation. LUE Sensation: decreased light touch LUE Coordination: decreased gross motor, decreased fine motor  Lower Extremity Assessment: Defer to PT evaluation LLE Deficits / Details: gross motor incoordination with control of hip knee and ankle LLE Sensation: decreased light touch LLE Coordination: decreased fine motor, decreased gross motor    ADLs  Overall ADL's : Needs assistance/impaired Eating/Feeding: Set up, Sitting Grooming: Set up, Sitting Upper Body Bathing: Min guard, Sitting Lower Body Bathing: Minimal assistance, Sit to/from stand Upper Body Dressing : Min guard,  Sitting Lower Body Dressing: Moderate assistance, Sit to/from stand Toilet Transfer: Minimal assistance, Ambulation (single hand held support) Toilet Transfer Details (indicate cue type and reason): Min A for balance in standing.  Functional mobility during ADLs: Minimal assistance  (single hald held A) General ADL Comments: Pt presenting with decreased cognition, balance, and coorindation impacting her safe performance of ADLs. Providing education on focusing at one objects to decrease dizziness.     Mobility  Overal bed mobility: Needs Assistance Bed Mobility: Supine to Sit, Sit to Supine Sidelying to sit: Mod assist Supine to sit: Min guard, HOB elevated Sit to supine: Supervision Sit to sidelying: Mod assist General bed mobility comments: pt preferred come to long-sitting and "get acclimated" then progress legs over EOB to sitting; educated on visual fixation and turning head first to find target, then moving body    Transfers  Overall transfer level: Needs assistance Equipment used: 1 person hand held assist Transfers: Sit to/from Stand Sit to Stand: Min assist General transfer comment: Min A for gaining balance in standing.     Ambulation / Gait / Stairs / Wheelchair Mobility  Ambulation/Gait General Gait Details: unable to tolerate; however did walk to bathroom with RW with nursing earlier    Posture / Balance Dynamic Sitting Balance Sitting balance - Comments: increased control during sitting time to fair- Balance Overall balance assessment: Needs assistance Sitting-balance support: Feet supported Sitting balance-Leahy Scale: Fair Sitting balance - Comments: increased control during sitting time to fair- Postural control: Right lateral lean Standing balance support: Single extremity supported, During functional activity Standing balance-Leahy Scale: Poor Standing balance comment: fearful to attempt without UE support    Special needs/care consideration Skin: ecchymosis BUEs      Previous Home Environment (from acute therapy documentation) Living Arrangements: Spouse/significant other  Lives With: Spouse Available Help at Discharge: Family, Available 24 hours/day Type of Home: House Home Layout: One level Home Access: Level entry Bathroom  Shower/Tub: Multimedia programmer: Standard Additional Comments: has not needed AD since last CVA after leaving CIR  Discharge Living Setting Plans for Discharge Living Setting: Patient's home Type of Home at Discharge: House Discharge Home Layout: One level Discharge Home Access: Level entry Discharge Bathroom Shower/Tub: Walk-in shower Discharge Bathroom Toilet: Standard Discharge Bathroom Accessibility: Yes How Accessible: Accessible via walker Does the patient have any problems obtaining your medications?: No  Social/Family/Support Systems Patient Roles: Spouse Contact Information: [phone] Anticipated Caregiver: Karren Burly Anticipated Caregiver's Contact Information: [phone] Ability/Limitations of Caregiver: none (Can provide min-mod A) Caregiver Availability: 24/7 Discharge Plan Discussed with Primary Caregiver: Yes Is Caregiver In Agreement with Plan?: Yes Does Caregiver/Family have Issues with Lodging/Transportation while Pt is in Rehab?: Yes   Goals Patient/Family Goal for Rehab: PT/OT mod I  Expected length of stay: 14-18 days Pt/Family Agrees to Admission and willing to participate: Yes Program Orientation Provided & Reviewed with Pt/Caregiver Including Roles  & Responsibilities: Yes   Decrease burden of Care through IP rehab admission: Specialzed equipment needs, Decrease number of caregivers and Patient/family education   Possible need for SNF placement upon discharge:not anticipated   Patient Condition: This patient's condition remains as documented in the consult dated 01/13/2020, in which the Rehabilitation Physician determined and documented that the patient's condition is appropriate for intensive rehabilitative care in an inpatient rehabilitation facility. Will admit to inpatient rehab today.  Preadmission Screen Completed By:  Genella Mech, CCC-SLP, 01/14/2020 9:46  AM ______________________________________________________________________   Discussed status with Dr. Dagoberto Ligas on 01/14/20 at 9:30  AM and received approval for admission today.  Admission Coordinator:  Genella Mech, time 9:30 AM/Date 01/14/20

## 2020-01-11 NOTE — Progress Notes (Signed)
[facility] PROGRESS NOTE   Mary Mercado VEL:[id number] DOB: 07/30/1940 DOA: 01/09/2020  PCP: [name], MD  Brief History/Interval Summary: 79 y.o. female with medical history significant of CAD in 2020, paroxysmal A. fib on Eliquis, hypertension, HLD, presented with new onset of vertigo and ataxia.  Was at her cardiologist office this morning for pharmacological stress test, and suddenly developed ataxia, slurred speech and a feeling of vertigo.  Patient has been taking Eliquis 5 mg twice daily, and never missed a dose.  Patient did not complain specifically one-sided weakness or numbness, no vision changes or headache no hearing changes.  Patient was feeling nausea on arrival in the ED which subsided.  Patient also described that " whole body was spinning around me" and has to close her eyes to ease these symptoms.  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  Moderate stenosis of the right P2 segment.  MRI of the brain acute 3 cm infarct in the right superior cerebellar artery  Reason for Visit: Acute stroke  Consultants: Neurology  Procedures: None  Antibiotics: Anti-infectives (From admission, onward)   None      Subjective/Interval History: Patient seems to be doing well.  She denies any new complaints.  Continues to have some difficulty speaking but better than before.  Denies any headaches.  No further nasal bleeding.    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  CT angiogram was done.  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 Right vertebral artery widely patent.  Neurology consulted.   Patient noted to have tremors involving her right shoulder area.  Likely related to her acute stroke. Patient on  Eliquis at home which is currently on hold.  Patient noted to be on aspirin.  Neurology has sent an inquiry to case management to see what patient's co-pay will be if her anticoagulation was changed over to Pradaxa.  If co-pay is high then they recommend continuing with Eliquis. Continue Lipitor.  LDL 81. Echocardiogram shows normal systolic function with grade 2 diastolic dysfunction.. Allow permissive hypertension Urine drug screen unremarkable HbA1c 5.9. PT and OT evaluation.  ER is recommended.  They have been consulted.  Dysphagia, resolved Apparently patient initially failed her swallow screen and subsequently passed it.  Attempts were made to place NG tube which resulted in epistaxis.  Epistaxis has resolved.  Seen by SLP and cleared for diet.   Paroxysmal atrial fibrillation/chronic diastolic CHF On Eliquis, flecainide and diltiazem at home.  These are being held currently.  Resume flecainide.  Essential hypertension Allowing permissive hypertension.  Noted to be on Micardis and diltiazem at home.  Currently on hold  History of hyperlipidemia Patient on Lipitor at home.  LDL is 81.  Atorvastatin was increased to 80 mg.  Hyperglycemia Not a fasting level.  HbA1c 5.9.  Hypokalemia To be repleted.  Acute urinary retention Patient had urinary retention when she was in the emergency department requiring in and out catheterization.  Bladder scans every shift.  Avoid Foley catheter as much as possible.  UA reviewed.  No evidence for infection.   DVT Prophylaxis: Okay to resume Lovenox.   Code Status: Full code Family Communication: No family at bedside Disposition Plan: Possible CIR candidate  Status is: Inpatient  Remains inpatient appropriate because:Ongoing diagnostic testing needed  not appropriate for outpatient work up and IV treatments appropriate due to intensity of illness or inability to take PO   Dispo: The patient is from: Home              Anticipated d/c is to:  CIR              Anticipated d/c date is: 2 days              Patient currently is not medically stable to d/c.      Medications:  Scheduled: .  stroke: mapping our early stages of recovery book   Does not apply Once  . aspirin EC  81 mg Oral Daily   Or  . aspirin  300 mg Rectal Daily  . atorvastatin  80 mg Oral q1800  . enoxaparin (LOVENOX) injection  40 mg Subcutaneous Q24H  . flecainide  50 mg Oral BID   Continuous:  MEQ:ASTMHDQ carbonate, famotidine, meclizine, prochlorperazine, senna-docusate   Objective:  Vital Signs  Vitals:   01/10/20 2017 01/10/20 2359 01/11/20 0407 01/11/20 0803  BP: 138/74 (!) 154/89 (!) 147/82 (!) 156/92  Pulse: 72 81 66 74  Resp: 18 18 18 18   Temp: 97.9 F (36.6 C) 98.6 F (37 C) (!) 97.5 F (36.4 C) 98.3 F (36.8 C)  TempSrc: Oral Oral Oral Oral  SpO2: 97% 95% 96% 97%    Intake/Output Summary (Last 24 hours) at 01/11/2020 1036 Last data filed at 01/11/2020 0800 Gross per 24 hour  Intake 953.84 ml  Output 1300 ml  Net -346.16 ml   There were no vitals filed for this visit.   General appearance: Awake alert.  In no distress.  Mildly distracted Resp: Clear to auscultation bilaterally.  Normal effort Cardio: S1-S2 is normal regular.  No S3-S4.  No rubs murmurs or bruit GI: Abdomen is soft.  Nontender nondistended.  Bowel sounds are present normal.  No masses organomegaly Extremities: No edema.  Able to move all her extremities    Lab Results:  Data Reviewed: I have personally reviewed following labs and imaging studies  CBC: Recent Labs  Lab 01/09/20 1248 01/09/20 1301 01/11/20 0241  WBC 7.7  --  9.2  NEUTROABS 4.9  --   --   HGB 13.5 13.9 14.0  HCT 42.1 41.0 43.0  MCV 92.5  --  91.5  PLT 262  --  222    Basic Metabolic Panel: Recent Labs  Lab 01/09/20 1301 01/09/20 1325 01/11/20 0241  NA 137 136 137  K 3.6 3.5 3.4*  CL 103 101 101  CO2  --  24 24  GLUCOSE 168* 209* 125*  BUN 9 7* 7*  CREATININE 0.60  0.76 0.68  CALCIUM  --  8.8* 8.8*    GFR: Estimated Creatinine Clearance: 50.5 mL/min (by C-G formula based on SCr of 0.68 mg/dL).  Liver Function Tests: Recent Labs  Lab 01/09/20 1325 01/11/20 0241  AST 35 23  ALT 29 30  ALKPHOS 79 83  BILITOT 1.0 1.4*  PROT 6.4* 6.5  ALBUMIN 3.9 3.7    Coagulation Profile: Recent Labs  Lab 01/09/20 1248  INR 1.3*    HbA1C: Recent Labs    01/10/20 0550  HGBA1C 5.9*    CBG: Recent Labs  Lab 01/09/20 1241  GLUCAP 176*    Lipid Profile: Recent Labs    01/10/20 0550  CHOL 183  HDL 92  LDLCALC 81  TRIG 52  CHOLHDL 2.0     Recent Results (  from the past 240 hour(s))  SARS CORONAVIRUS 2 (TAT 6-24 HRS) Nasopharyngeal Nasopharyngeal Swab     Status: None   Collection Time: 01/04/20  1:56 PM   Specimen: Nasopharyngeal Swab  Result Value Ref Range Status   SARS Coronavirus 2 NEGATIVE NEGATIVE Final    Comment: (NOTE) SARS-CoV-2 target nucleic acids are NOT DETECTED.  The SARS-CoV-2 RNA is generally detectable in upper and lower respiratory specimens during the acute phase of infection. Negative results do not preclude SARS-CoV-2 infection, do not rule out co-infections with other pathogens, and should not be used as the sole basis for treatment or other patient management decisions. Negative results must be combined with clinical observations, patient history, and epidemiological information. The expected result is Negative.  Fact Sheet for Patients: SugarRoll.be  Fact Sheet for Healthcare Providers: https://www.woods-mathews.com/  This test is not yet approved or cleared by the [place] FDA and  has been authorized for detection and/or diagnosis of SARS-CoV-2 by FDA under an Emergency Use Authorization (EUA). This EUA will remain  in effect (meaning this test can be used) for the duration of the COVID-19 declaration under Se ction 564(b)(1) of the Act, 21 U.S.C.  section 360bbb-3(b)(1), unless the authorization is terminated or revoked sooner.  Performed at [facility], [place] [address] [place], [place] [address]   SARS Coronavirus 2 by RT PCR (hospital order, performed in [facility] hospital lab) Nasopharyngeal Nasopharyngeal Swab     Status: None   Collection Time: 01/09/20  5:00 PM   Specimen: Nasopharyngeal Swab  Result Value Ref Range Status   SARS Coronavirus 2 NEGATIVE NEGATIVE Final    Comment: (NOTE) SARS-CoV-2 target nucleic acids are NOT DETECTED.  The SARS-CoV-2 RNA is generally detectable in upper and lower respiratory specimens during the acute phase of infection. The lowest concentration of SARS-CoV-2 viral copies this assay can detect is 250 copies / mL. A negative result does not preclude SARS-CoV-2 infection and should not be used as the sole basis for treatment or other patient management decisions.  A negative result may occur with improper specimen collection / handling, submission of specimen other than nasopharyngeal swab, presence of viral mutation(s) within the areas targeted by this assay, and inadequate number of viral copies (<250 copies / mL). A negative result must be combined with clinical observations, patient history, and epidemiological information.  Fact Sheet for Patients:   StrictlyIdeas.no  Fact Sheet for Healthcare Providers: BankingDealers.co.za  This test is not yet approved or  cleared by the [place] FDA and has been authorized for detection and/or diagnosis of SARS-CoV-2 by FDA under an Emergency Use Authorization (EUA).  This EUA will remain in effect (meaning this test can be used) for the duration of the COVID-19 declaration under Section 564(b)(1) of the Act, 21 U.S.C. section 360bbb-3(b)(1), unless the authorization is terminated or revoked sooner.  Performed at [facility], [place] [address] [place], [place] [address]        Radiology Studies: CT Code Stroke CTA Head W/WO contrast  Result Date: 01/09/2020 CLINICAL DATA:  Focal neuro deficit.  Dizziness. EXAM: CT ANGIOGRAPHY HEAD AND NECK TECHNIQUE: Multidetector CT imaging of the head and neck was performed using the standard protocol during bolus administration of intravenous contrast. Multiplanar CT image reconstructions and MIPs were obtained to evaluate the vascular anatomy. Carotid stenosis measurements (when applicable) are obtained utilizing NASCET criteria, using the distal internal carotid diameter as the denominator. CONTRAST:  42mL OMNIPAQUE IOHEXOL 350 MG/ML SOLN  75 mL Omnipaque 350 IV COMPARISON:  CT head 01/09/2020 FINDINGS: CTA NECK FINDINGS Aortic arch: Atherosclerotic calcification aortic arch. Atherosclerotic calcification in the great vessels especially left subclavian artery without significant stenosis. Right carotid system: Atherosclerotic disease at the right carotid bifurcation. 25% diameter stenosis proximal right internal carotid artery. Left carotid system: Atherosclerotic calcification left carotid bifurcation without stenosis. Vertebral arteries: Right vertebral artery dominant and without stenosis. Moderate to severe stenosis at the origin of the left vertebral artery. Left vertebral artery is then patent to the basilar without additional stenosis. Skeleton: No acute abnormality. Other neck: Negative for mass or adenopathy. Upper chest: Lung apices clear bilaterally. Review of the MIP images confirms the above findings CTA HEAD FINDINGS Anterior circulation: Atherosclerotic calcification throughout the cavernous carotid bilaterally with mild to moderate stenosis bilaterally. Anterior and middle cerebral arteries patent bilaterally without large vessel occlusion or flow limiting stenosis. Posterior circulation: Both vertebral arteries patent to the basilar. PICA patent bilaterally. Basilar widely patent. Mild atherosclerotic irregularity in the  basilar. AICA, superior cerebellar, and posterior cerebral arteries patent bilaterally without large vessel occlusion. Moderate stenosis right P2 segment. Left posterior cerebral artery patent without stenosis. Venous sinuses: Normal venous enhancement. Anatomic variants: None Review of the MIP images confirms the above findings IMPRESSION: 1. Negative for intracranial large vessel occlusion 2. 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 Mild to moderate atherosclerotic stenosis in the supraclinoid internal carotid artery bilaterally. 3. 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 Right vertebral artery widely patent 4. Moderate stenosis right P2 segment. 5. These results were called by telephone at the time of interpretation on 01/09/2020 at 1:15 pm to provider [facility] , who verbally acknowledged these results. Electronically Signed   By: Franchot Gallo M.D.   On: 01/09/2020 13:16   CT Code Stroke CTA Neck W/WO contrast  Result Date: 01/09/2020 CLINICAL DATA:  Focal neuro deficit.  Dizziness. EXAM: CT ANGIOGRAPHY HEAD AND NECK TECHNIQUE: Multidetector CT imaging of the head and neck was performed using the standard protocol during bolus administration of intravenous contrast. Multiplanar CT image reconstructions and MIPs were obtained to evaluate the vascular anatomy. Carotid stenosis measurements (when applicable) are obtained utilizing NASCET criteria, using the distal internal carotid diameter as the denominator. CONTRAST:  57mL OMNIPAQUE IOHEXOL 350 MG/ML SOLN 75 mL Omnipaque 350 IV COMPARISON:  CT head 01/09/2020 FINDINGS: CTA NECK FINDINGS Aortic arch: Atherosclerotic calcification aortic arch. Atherosclerotic calcification in the great vessels especially left subclavian artery without significant stenosis. Right carotid system: Atherosclerotic disease at the right carotid bifurcation. 25% diameter stenosis proximal right  internal carotid artery. Left carotid system: Atherosclerotic calcification left carotid bifurcation without stenosis. Vertebral arteries: Right vertebral artery dominant and without stenosis. Moderate to severe stenosis at the origin of the left vertebral artery. Left vertebral artery is then patent to the basilar without additional stenosis. Skeleton: No acute abnormality. Other neck: Negative for mass or adenopathy. Upper chest: Lung apices clear bilaterally. Review of the MIP images confirms the above findings CTA HEAD FINDINGS Anterior circulation: Atherosclerotic calcification throughout the cavernous carotid bilaterally with mild to moderate stenosis bilaterally. Anterior and middle cerebral arteries patent bilaterally without large vessel occlusion or flow limiting stenosis. Posterior circulation: Both vertebral arteries patent to the basilar. PICA patent bilaterally. Basilar widely patent. Mild atherosclerotic irregularity in the basilar. AICA, superior cerebellar, and posterior cerebral arteries patent bilaterally without large vessel occlusion. Moderate stenosis right P2 segment. Left posterior cerebral artery patent without stenosis. Venous sinuses:  Normal venous enhancement. Anatomic variants: None Review of the MIP images confirms the above findings IMPRESSION: 1. Negative for intracranial large vessel occlusion 2. 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 Mild to moderate atherosclerotic stenosis in the supraclinoid internal carotid artery bilaterally. 3. 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 Right vertebral artery widely patent 4. Moderate stenosis right P2 segment. 5. These results were called by telephone at the time of interpretation on 01/09/2020 at 1:15 pm to provider [facility] , who verbally acknowledged these results. Electronically Signed   By: Franchot Gallo M.D.   On: 01/09/2020 13:16   MR BRAIN WO CONTRAST   Result Date: 01/09/2020 CLINICAL DATA:  79 year old female code stroke presentation this morning. Headache and vomiting. EXAM: MRI HEAD WITHOUT CONTRAST TECHNIQUE: Multiplanar, multiecho pulse sequences of the brain and surrounding structures were obtained without intravenous contrast. COMPARISON:  CT head and CTA head and neck earlier today. Brain MRI 06/01/2018. FINDINGS: Brain: Patchy 3 cm area of restricted diffusion in the right superior cerebellar artery territory (series 3, image 14). Minimal T2 and FLAIR hyperintensity. No associated hemorrhage or mass effect. Superimposed chronic microhemorrhage elsewhere in the right cerebellar hemisphere. No other convincing posterior fossa restricted diffusion. Expected evolution of the posterior right frontal lobe/posterior operculum infarct last year with encephalomalacia there now. No supratentorial restricted diffusion today. Elsewhere stable gray and white matter signal including Patchy and confluent bilateral cerebral white matter and pontine T2 and FLAIR hyperintensity.-no other chronic cerebral blood products. No midline shift, mass effect, evidence of mass lesion, ventriculomegaly, extra-axial collection or acute intracranial hemorrhage. Cervicomedullary junction and pituitary are within normal limits. Vascular: Major intracranial vascular flow voids are stable since last year. Skull and upper cervical spine: Negative for age visible cervical spine. Visualized bone marrow signal is within normal limits. Sinuses/Orbits: Stable, negative. Other: Mastoids remain clear. Grossly stable and normal visible internal auditory structures. Visible scalp and face appear negative. IMPRESSION: 1. Acute 3 cm infarct in the Right Superior Cerebellar Artery territory. No associated hemorrhage or mass effect. 2. Expected evolution of the right MCA infarcts last year. Otherwise stable underlying chronic small vessel disease. Electronically Signed   By: Genevie Ann M.D.   On:  01/09/2020 15:42   MYOCARDIAL PERFUSION IMAGING  Result Date: 01/09/2020 Please see documentation in chart; this is a non-gated rest-only study. Patient had rest images completed, which are without perfusion defects. Prior to acquisition of stress images, patient had event concerning for acute stroke and was urgently transported to the hospital via EMS.  ECHOCARDIOGRAM COMPLETE  Result Date: 01/10/2020    ECHOCARDIOGRAM REPORT   Patient Name:   Mary Mercado [id number] Date of Exam: 01/10/2020 Medical Rec #:  [id number]                 Height:       65.0 in Accession #:    [id number]                Weight:       142.0 lb Date of Birth:  [birth year]                 BSA:          1.710 m Patient Age:    20 years                  BP:           148/85 mmHg Patient Gender: F  HR:           83 bpm. Exam Location:  Inpatient Procedure: 2D Echo, 3D Echo, Color Doppler and Cardiac Doppler Indications:    TIA  History:        Patient has prior history of Echocardiogram examinations, most                 recent 06/06/2019. Arrythmias:Atrial Fibrillation; Risk                 Factors:COVID+ 08/2018, Hypertension and Dyslipidemia. 2 days                 post cardioversion per patient.  Sonographer:    Raquel Sarna Senior RDCS Referring Phys: [id number] [place]  1. Left ventricular ejection fraction, by estimation, is 60 to 65%. The left ventricle has normal function. The left ventricle has no regional wall motion abnormalities. Left ventricular diastolic parameters are consistent with Grade II diastolic dysfunction (pseudonormalization). Elevated left atrial pressure.  2. Right ventricular systolic function is normal. The right ventricular size is normal. There is moderately elevated pulmonary artery systolic pressure. The estimated right ventricular systolic pressure is 86.7 mmHg.  3. Left atrial size was severely dilated.  4. Right atrial size was severely dilated.  5. The mitral valve is  degenerative. Mild mitral valve regurgitation. No evidence of mitral stenosis.  6. Tricuspid valve regurgitation is moderate to severe.  7. The aortic valve is tricuspid. There is mild calcification of the aortic valve. There is mild thickening of the aortic valve. Aortic valve regurgitation is not visualized. Mild to moderate aortic valve sclerosis/calcification is present, without any evidence of aortic stenosis.  8. The inferior vena cava is normal in size with <50% respiratory variability, suggesting right atrial pressure of 8 mmHg. Comparison(s): No significant change from prior study. FINDINGS  Left Ventricle: Left ventricular ejection fraction, by estimation, is 60 to 65%. The left ventricle has normal function. The left ventricle has no regional wall motion abnormalities. The left ventricular internal cavity size was normal in size. There is  no left ventricular hypertrophy. Left ventricular diastolic parameters are consistent with Grade II diastolic dysfunction (pseudonormalization). Elevated left atrial pressure. Right Ventricle: The right ventricular size is normal. No increase in right ventricular wall thickness. Right ventricular systolic function is normal. There is moderately elevated pulmonary artery systolic pressure. The tricuspid regurgitant velocity is 3.48 m/s, and with an assumed right atrial pressure of 8 mmHg, the estimated right ventricular systolic pressure is 67.2 mmHg. Left Atrium: Left atrial size was severely dilated. Right Atrium: Right atrial size was severely dilated. Pericardium: Trivial pericardial effusion is present. Mitral Valve: The mitral valve is degenerative in appearance. There is moderate calcification of the posterior mitral valve leaflet(s). Mild mitral annular calcification. Mild mitral valve regurgitation. No evidence of mitral valve stenosis. Tricuspid Valve: The tricuspid valve is grossly normal. Tricuspid valve regurgitation is moderate to severe. No evidence of  tricuspid stenosis. Aortic Valve: The aortic valve is tricuspid. There is mild calcification of the aortic valve. There is mild thickening of the aortic valve. Aortic valve regurgitation is not visualized. Mild to moderate aortic valve sclerosis/calcification is present, without any evidence of aortic stenosis. Pulmonic Valve: The pulmonic valve was grossly normal. Pulmonic valve regurgitation is not visualized. No evidence of pulmonic stenosis. Aorta: The aortic root and ascending aorta are structurally normal, with no evidence of dilitation. Venous: The inferior vena cava is normal in size with less than 50% respiratory variability,  suggesting right atrial pressure of 8 mmHg. IAS/Shunts: The atrial septum is grossly normal.  LEFT VENTRICLE PLAX 2D LVIDd:         4.20 cm  Diastology LVIDs:         3.00 cm  LV e' medial:    8.27 cm/s LV PW:         0.80 cm  LV E/e' medial:  11.9 LV IVS:        0.90 cm  LV e' lateral:   11.00 cm/s LVOT diam:     2.10 cm  LV E/e' lateral: 9.0 LV SV:         72 LV SV Index:   42 LVOT Area:     3.46 cm  RIGHT VENTRICLE RV S prime:     12.90 cm/s TAPSE (M-mode): 2.1 cm LEFT ATRIUM              Index       RIGHT ATRIUM           Index LA diam:        4.90 cm  2.87 cm/m  RA Area:     19.50 cm LA Vol (A2C):   100.0 ml 58.47 ml/m RA Volume:   52.30 ml  30.58 ml/m LA Vol (A4C):   65.0 ml  38.01 ml/m LA Biplane Vol: 81.0 ml  47.36 ml/m  AORTIC VALVE LVOT Vmax:   96.90 cm/s LVOT Vmean:  67.700 cm/s LVOT VTI:    0.207 m  AORTA Ao Asc diam: 3.40 cm MITRAL VALVE               TRICUSPID VALVE MV Area (PHT): 4.21 cm    TR Peak grad:   48.4 mmHg MV Decel Time: 180 msec    TR Vmax:        348.00 cm/s MV E velocity: 98.80 cm/s MV A velocity: 54.80 cm/s  SHUNTS MV E/A ratio:  1.80        Systemic VTI:  0.21 m                            Systemic Diam: 2.10 cm Eleonore Chiquito MD Electronically signed by Eleonore Chiquito MD Signature Date/Time: 01/10/2020/10:40:54 AM    Final    CT HEAD CODE STROKE WO  CONTRAST  Result Date: 01/09/2020 CLINICAL DATA:  Code stroke. Acute neuro deficit. Headache and vomiting. EXAM: CT HEAD WITHOUT CONTRAST TECHNIQUE: Contiguous axial images were obtained from the base of the skull through the vertex without intravenous contrast. COMPARISON:  05/31/2018 FINDINGS: Brain: Chronic microvascular ischemic change in the white matter. Chronic infarct in the right frontal parietal cortex. Negative for acute infarct, hemorrhage, mass Vascular: Atherosclerotic calcification in the carotid and vertebral arteries. Negative for hyperdense vessel Skull: Negative Sinuses/Orbits: Paranasal sinuses clear. Left cataract extraction. No orbital lesion. Other: None ASPECTS ([place] Stroke Program Early CT Score) - Ganglionic level infarction (caudate, lentiform nuclei, internal capsule, insula, M1-M3 cortex): 7 - Supraganglionic infarction (M4-M6 cortex): 3 Total score (0-10 with 10 being normal): 10 IMPRESSION: 1. No acute abnormality 2. ASPECTS is 10 3. Chronic infarct right frontal parietal cortex. Chronic microvascular ischemic change in the white matter. 4. Code stroke imaging results were communicated on 01/09/2020 at 1:01 pm to provider Dr. Leonel Ramsay via [facility] Electronically Signed   By: Franchot Gallo M.D.   On: 01/09/2020 13:02       LOS: 2 days   Bonnielee Haff  [facility] [employer] operational officer on [email or web address]  01/11/2020, 10:36 AM

## 2020-01-11 NOTE — Progress Notes (Signed)
Inpatient Rehab Admissions Coordinator:   Met with patient at bedside to discuss potential CIR admission. Pt. Stated interest. Will pursue for potential admit next week, pending bed availability.   Clemens Catholic, [place], [place] Admissions Coordinator  [phone] ([place]) [phone] (office)

## 2020-01-12 LAB — CBC
HCT: 44.7 % (ref 36.0–46.0)
Hemoglobin: 14.9 g/dL (ref 12.0–15.0)
MCH: 30.3 pg (ref 26.0–34.0)
MCHC: 33.3 g/dL (ref 30.0–36.0)
MCV: 90.9 fL (ref 80.0–100.0)
Platelets: 228 10*3/uL (ref 150–400)
RBC: 4.92 MIL/uL (ref 3.87–5.11)
RDW: 13 % (ref 11.5–15.5)
WBC: 9.2 10*3/uL (ref 4.0–10.5)
nRBC: 0 % (ref 0.0–0.2)

## 2020-01-12 LAB — COMPREHENSIVE METABOLIC PANEL
ALT: 24 U/L (ref 0–44)
AST: 20 U/L (ref 15–41)
Albumin: 3.6 g/dL (ref 3.5–5.0)
Alkaline Phosphatase: 83 U/L (ref 38–126)
Anion gap: 9 (ref 5–15)
BUN: 8 mg/dL (ref 8–23)
CO2: 27 mmol/L (ref 22–32)
Calcium: 9 mg/dL (ref 8.9–10.3)
Chloride: 101 mmol/L (ref 98–111)
Creatinine, Ser: 0.62 mg/dL (ref 0.44–1.00)
GFR calc Af Amer: >60 mL/min (ref >60)
GFR calc non Af Amer: >60 mL/min (ref >60)
Glucose, Bld: 126 mg/dL — ABNORMAL HIGH (ref 70–99)
Potassium: 3.6 mmol/L (ref 3.5–5.1)
Sodium: 137 mmol/L (ref 135–145)
Total Bilirubin: 1.6 mg/dL — ABNORMAL HIGH (ref 0.3–1.2)
Total Protein: 6.7 g/dL (ref 6.5–8.1)

## 2020-01-12 NOTE — Evaluation (Signed)
Speech Language Pathology Evaluation Patient Details Name: Mary Mercado MRN: [id number] DOB: 12/14/1940 Today's Date: 01/12/2020 Time: 2683-4196 SLP Time Calculation (min) (ACUTE ONLY): 19 min  Problem List:  Patient Active Problem List   Diagnosis Date Noted  . CVA (cerebral vascular accident) ([place]) 01/09/2020  . Dizziness 11/30/2018  . Atrial fibrillation ([place]) 08/13/2018  . Prediabetes 06/15/2018  . Palpitations 06/05/2018  . Essential hypertension 06/05/2018  . Hyperlipidemia LDL goal <70 06/05/2018  . Acute ischemic right MCA stroke ([place]) 06/05/2018  . 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 SVT (supraventricular tachycardia) ([place]) 05/28/2015  . PVC's (premature ventricular contractions) 05/28/2015  . BENIGN POSITIONAL VERTIGO 06/16/2009  . ALLERGIC RHINITIS 06/16/2009   Past Medical History:  Past Medical History:  Diagnosis Date  . Allergy   . CVA (cerebral vascular accident) ([place])   . Hyperlipidemia LDL goal <70   . Hypertension   . Palpitations   . Vertigo, benign positional    Past Surgical History:  Past Surgical History:  Procedure Laterality Date  . CARDIOVERSION Left 10/31/2018   Procedure: CARDIOVERSION;  Surgeon: Acie Fredrickson Wonda Cheng, MD;  Location: [place];  Service: Cardiovascular;  Laterality: Left;  . CARDIOVERSION N/A 01/08/2020   Procedure: CARDIOVERSION;  Surgeon: [name], MD;  Location: [facility] ENDOSCOPY;  Service: Cardiovascular;  Laterality: N/A;  . Hemilaminectomy and microdiskectomy at L4-5 on the left.  12/10/2003  . TEE WITHOUT CARDIOVERSION N/A 06/05/2018   Procedure: TRANSESOPHAGEAL ECHOCARDIOGRAM (TEE);  Surgeon: Lelon Perla, MD;  Location: [facility] ENDOSCOPY;  Service: Cardiovascular;  Laterality: N/A;  loop   HPI:  79 y.o. female with medical history significant of CAD in 2020, paroxysmal A. fib on Eliquis, hypertension, HLD, presented with new onset of vertigo and ataxia. MRI  revealed an acute infarct in the right superior cerebellar artery territory.     Assessment / Plan / Recommendation Clinical Impression  Pt participated in speech/language/cognition evaluation with her husband present. Pt reported that she has some difficulty with memory at baseline for which she uses external memory aids. However, she denied any baseline deficits in speech or language. She reported that she has a high-school education and is a retired Engineer, site. Per the pt, her speech is approximately 25% back to baseline. [facility] Mental Status Examination was completed to evaluate the pt's cognitive-linguistic skills. She achieved a score of 28/30 which is within the normal limits of 27 or more out of 30. However, additional processing time was needed for accuracy during executive function tasks and self correction was intermittently needed. She presented with mild dysarthria characterized by reduced articulatory precision which negatively impacted speech intelligibility at the conversational level. Acute skilled SLP services are clinically indicated at this time to improve motor speech function. Cognitive-linguistic treatment will be deferred to the next venue of care if still clinically indicated at that time.     SLP Assessment  SLP Recommendation/Assessment: Patient needs continued Speech Lanaguage Pathology Services SLP Visit Diagnosis: Dysarthria and anarthria (R47.1);Cognitive communication deficit (R41.841)    Follow Up Recommendations  Inpatient Rehab    Frequency and Duration min 2x/week  2 weeks      SLP Evaluation Cognition  Overall Cognitive Status:  (Functional with additional processing time) Arousal/Alertness: Awake/alert Orientation Level: Oriented X4 Attention: Focused;Sustained Focused Attention: Appears intact Sustained Attention: Appears intact Memory: Impaired Memory Impairment: Retrieval deficit;Decreased recall of new information  (Immediate: 5/5; delayed: 4/5; with cues: 1/1) Awareness: Appears intact  Problem Solving: Appears intact Executive Function: Reasoning;Sequencing;Organizing Reasoning: Appears intact Sequencing:  (Clock drawing: 4/4 with self- correction) Organizing: Impaired Organizing Impairment: Verbal complex (backward digit span: 1/3)       Comprehension  Auditory Comprehension Overall Auditory Comprehension: Appears within functional limits for tasks assessed Yes/No Questions: Within Functional Limits Commands: Within Functional Limits Conversation: Complex Visual Recognition/Discrimination Discrimination: Within Function Limits    Expression Expression Primary Mode of Expression: Verbal Verbal Expression Overall Verbal Expression: Appears within functional limits for tasks assessed Initiation: No impairment Level of Generative/Spontaneous Verbalization: Conversation Repetition: No impairment Naming: No impairment Pragmatics: No impairment   Oral / Motor  Oral Motor/Sensory Function Overall Oral Motor/Sensory Function: Within functional limits Motor Speech Overall Motor Speech: Impaired Respiration: Within functional limits Phonation: Normal Resonance: Within functional limits Articulation: Impaired Level of Impairment: Conversation Intelligibility: Intelligibility reduced Word: 75-100% accurate Phrase: 75-100% accurate Sentence: 75-100% accurate Conversation: 75-100% accurate Motor Planning: Witnin functional limits Motor Speech Errors: Aware;Consistent   Olson Lucarelli I. Hardin Negus, [place], [place] Office number [phone] Pager [phone]                    Horton Marshall 01/12/2020, 4:55 PM

## 2020-01-12 NOTE — Progress Notes (Signed)
  Speech Language Pathology Treatment: Cognitive-Linquistic (Dysarthria)  Patient Details Name: Mary Mercado MRN: [id number] DOB: 30-Aug-1940 Today's Date: 01/12/2020 Time: 0156-1537 SLP Time Calculation (min) (ACUTE ONLY): 15 min  Assessment / Plan / Recommendation Clinical Impression  Pt was seen for dysarthria treatment and was cooperative during the session. Pt and her husband were educated regarding the nature of dysarthria, and compensatory strategies to improve speech intelligibility. Dysarthria handout was used to reinforce areas of education and both parties verbalized understanding. She used compensatory strategies at the paragraph level with 70% accuracy increasing to 100% accuracy with cues for overarticulation and rate. She required intermittent cueing during conversation and demonstrated more independence with use of strategies by the end of the session. SLP will continue to follow pt.    HPI HPI: 79 y.o. female with medical history significant of CAD in 2020, paroxysmal A. fib on Eliquis, hypertension, HLD, presented with new onset of vertigo and ataxia. 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        SLP Plan  Continue with current plan of care  Patient needs continued Speech Lanaguage Pathology Services    Recommendations                   Follow up Recommendations: Inpatient Rehab SLP Visit Diagnosis: Dysarthria and anarthria (R47.1);Cognitive communication deficit (R41.841) Plan: Continue with current plan of care       Yareliz Thorstenson I. Hardin Negus, [place], [place] Office number [phone] Pager [phone]                 Mary Mercado 01/12/2020, 5:04 PM

## 2020-01-12 NOTE — Progress Notes (Signed)
[facility] PROGRESS NOTE   Mary Mercado QIO:[id number] DOB: 10-09-1940 DOA: 01/09/2020  PCP: [name], MD  Brief History/Interval Summary: 79 y.o. female with medical history significant of CAD in 2020, paroxysmal A. fib on Eliquis, hypertension, HLD, presented with new onset of vertigo and ataxia.  Was at her cardiologist office this morning for pharmacological stress test, and suddenly developed ataxia, slurred speech and a feeling of vertigo.  Patient has been taking Eliquis 5 mg twice daily, and never missed a dose.  Patient did not complain specifically one-sided weakness or numbness, no vision changes or headache no hearing changes.  Patient was feeling nausea on arrival in the ED which subsided.  Patient also described that " whole body was spinning around me" and has to close her eyes to ease these symptoms.  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  Moderate stenosis of the right P2 segment.  MRI of the brain acute 3 cm infarct in the right superior cerebellar artery  Reason for Visit: Acute stroke  Consultants: Neurology  Procedures: None  Antibiotics: Anti-infectives (From admission, onward)   None      Subjective/Interval History: Patient states that she is feeling better.  Her vertiginous symptoms have improved.  Denies any headaches.  Denies any nausea or vomiting.      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  CT angiogram was done.  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 Right vertebral artery widely patent.  Neurology consulted.   Patient noted to have tremors involving her right shoulder area.  Likely related to her acute stroke. Patient on Eliquis at home which is currently on hold.   Patient noted to be on aspirin.  Neurology has sent an inquiry to case management to see what patient's co-pay will be if her anticoagulation was changed over to Pradaxa.  If co-pay is high then they recommend continuing with Eliquis. Continue Lipitor.  LDL 81. Echocardiogram shows normal systolic function with grade 2 diastolic dysfunction.. Urine drug screen unremarkable HbA1c 5.9. PT and OT evaluation.  CIR is recommended.  They have been consulted. Patient remains stable from a neurological standpoint.  Dysphagia, resolved Apparently patient initially failed her swallow screen and subsequently passed it.  Attempts were made to place NG tube which resulted in epistaxis.  Epistaxis has resolved.  Seen by SLP and cleared for diet.  Tolerating well.  Paroxysmal atrial fibrillation/chronic diastolic CHF On Eliquis, flecainide and diltiazem at home.  Continue flecainide.  Diltiazem on hold to allow permissive hypertension.  Essential hypertension Allowing permissive hypertension.  Noted to be on Micardis and diltiazem at home.  Currently on hold.  Consider resuming in the next 24 to 48 hours.  History of hyperlipidemia Patient on Lipitor at home.  LDL is 81.  Atorvastatin was increased to 80 mg.  Hyperglycemia Not a fasting level.  HbA1c 5.9.  Hypokalemia Repleted.  Acute urinary retention Patient had urinary retention when she was in the emergency department requiring in and out catheterization.  Bladder scans every shift.  Avoid Foley catheter as much as possible.  UA reviewed.  No evidence for infection.  It appears that she has been able to void on her own for the last 24 hours.  Bladder scan done yesterday evening showed only 80 mL.  DVT Prophylaxis:  Lovenox.   Code Status: Full code Family Communication: No family at bedside Disposition Plan: Possible CIR candidate  Status is: Inpatient  Remains inpatient appropriate because:Ongoing diagnostic testing needed not appropriate  for outpatient work up and IV treatments appropriate due to intensity of illness or inability to take PO   Dispo: The patient is from: Home              Anticipated d/c is to: CIR              Anticipated d/c date is: 2 days              Patient currently is not medically stable to d/c.      Medications:  Scheduled: .  stroke: mapping our early stages of recovery book   Does not apply Once  . aspirin EC  81 mg Oral Daily   Or  . aspirin  300 mg Rectal Daily  . atorvastatin  80 mg Oral q1800  . enoxaparin (LOVENOX) injection  40 mg Subcutaneous Q24H  . flecainide  50 mg Oral BID   Continuous:  VEH:MCNOBSJ carbonate, famotidine, meclizine, prochlorperazine, senna-docusate   Objective:  Vital Signs  Vitals:   01/11/20 1953 01/11/20 2339 01/12/20 0308 01/12/20 0816  BP: (!) 146/72 (!) 144/86 (!) 158/95 (!) 165/89  Pulse: 79 76 84 82  Resp:  16 16 16   Temp: 98.8 F (37.1 C) 97.6 F (36.4 C) 98 F (36.7 C) 98.5 F (36.9 C)  TempSrc: Oral Oral Oral Oral  SpO2: 98% 97% 98% 98%    Intake/Output Summary (Last 24 hours) at 01/12/2020 0913 Last data filed at 01/12/2020 0900 Gross per 24 hour  Intake 560 ml  Output 1075 ml  Net -515 ml   There were no vitals filed for this visit.   General appearance: Awake alert.  In no distress Resp: Clear to auscultation bilaterally.  Normal effort Cardio: S1-S2 is normal regular.  No S3-S4.  No rubs murmurs or bruit GI: Abdomen is soft.  Nontender nondistended.  Bowel sounds are present normal.  No masses organomegaly Extremities: No edema.  Full range of motion of lower extremities. No facial asymmetry.  Moving her arms and legs equally.    Lab Results:  Data Reviewed: I have personally reviewed following labs and imaging studies  CBC: Recent Labs  Lab 01/09/20 1248 01/09/20 1301 01/11/20 0241 01/12/20 0803  WBC 7.7  --  9.2 9.2  NEUTROABS 4.9  --   --   --   HGB 13.5 13.9 14.0 14.9  HCT 42.1 41.0 43.0 44.7  MCV  92.5  --  91.5 90.9  PLT 262  --  218 628    Basic Metabolic Panel: Recent Labs  Lab 01/09/20 1301 01/09/20 1325 01/11/20 0241 01/12/20 0803  NA 137 136 137 137  K 3.6 3.5 3.4* 3.6  CL 103 101 101 101  CO2  --  24 24 27   GLUCOSE 168* 209* 125* 126*  BUN 9 7* 7* 8  CREATININE 0.60 0.76 0.68 0.62  CALCIUM  --  8.8* 8.8* 9.0    GFR: Estimated Creatinine Clearance: 50.5 mL/min (by C-G formula based on SCr of 0.62 mg/dL).  Liver Function Tests: Recent Labs  Lab 01/09/20 1325 01/11/20 0241 01/12/20 0803  AST 35 23 20  ALT 29 30 24   ALKPHOS 79 83 83  BILITOT 1.0 1.4* 1.6*  PROT 6.4* 6.5 6.7  ALBUMIN 3.9 3.7 3.6    Coagulation Profile:  Recent Labs  Lab 01/09/20 1248  INR 1.3*    HbA1C: Recent Labs    01/10/20 0550  HGBA1C 5.9*    CBG: Recent Labs  Lab 01/09/20 1241  GLUCAP 176*    Lipid Profile: Recent Labs    01/10/20 0550  CHOL 183  HDL 92  LDLCALC 81  TRIG 52  CHOLHDL 2.0     Recent Results (from the past 240 hour(s))  SARS CORONAVIRUS 2 (TAT 6-24 HRS) Nasopharyngeal Nasopharyngeal Swab     Status: None   Collection Time: 01/04/20  1:56 PM   Specimen: Nasopharyngeal Swab  Result Value Ref Range Status   SARS Coronavirus 2 NEGATIVE NEGATIVE Final    Comment: (NOTE) SARS-CoV-2 target nucleic acids are NOT DETECTED.  The SARS-CoV-2 RNA is generally detectable in upper and lower respiratory specimens during the acute phase of infection. Negative results do not preclude SARS-CoV-2 infection, do not rule out co-infections with other pathogens, and should not be used as the sole basis for treatment or other patient management decisions. Negative results must be combined with clinical observations, patient history, and epidemiological information. The expected result is Negative.  Fact Sheet for Patients: SugarRoll.be  Fact Sheet for Healthcare Providers: https://www.woods-mathews.com/  This test  is not yet approved or cleared by the [place] FDA and  has been authorized for detection and/or diagnosis of SARS-CoV-2 by FDA under an Emergency Use Authorization (EUA). This EUA will remain  in effect (meaning this test can be used) for the duration of the COVID-19 declaration under Se ction 564(b)(1) of the Act, 21 U.S.C. section 360bbb-3(b)(1), unless the authorization is terminated or revoked sooner.  Performed at [facility], [place] [address], [place], [place] [address]   SARS Coronavirus 2 by RT PCR (hospital order, performed in [facility] hospital lab) Nasopharyngeal Nasopharyngeal Swab     Status: None   Collection Time: 01/09/20  5:00 PM   Specimen: Nasopharyngeal Swab  Result Value Ref Range Status   SARS Coronavirus 2 NEGATIVE NEGATIVE Final    Comment: (NOTE) SARS-CoV-2 target nucleic acids are NOT DETECTED.  The SARS-CoV-2 RNA is generally detectable in upper and lower respiratory specimens during the acute phase of infection. The lowest concentration of SARS-CoV-2 viral copies this assay can detect is 250 copies / mL. A negative result does not preclude SARS-CoV-2 infection and should not be used as the sole basis for treatment or other patient management decisions.  A negative result may occur with improper specimen collection / handling, submission of specimen other than nasopharyngeal swab, presence of viral mutation(s) within the areas targeted by this assay, and inadequate number of viral copies (<250 copies / mL). A negative result must be combined with clinical observations, patient history, and epidemiological information.  Fact Sheet for Patients:   StrictlyIdeas.no  Fact Sheet for Healthcare Providers: BankingDealers.co.za  This test is not yet approved or  cleared by the [place] FDA and has been authorized for detection and/or diagnosis of SARS-CoV-2 by FDA under an Emergency Use  Authorization (EUA).  This EUA will remain in effect (meaning this test can be used) for the duration of the COVID-19 declaration under Section 564(b)(1) of the Act, 21 U.S.C. section 360bbb-3(b)(1), unless the authorization is terminated or revoked sooner.  Performed at [facility], [place] [address] [place], [place] [address]       Radiology Studies: No results found.     LOS: 3 days   [facility]  Pager on [email or web address]  01/12/2020, 9:13 AM

## 2020-01-13 DIAGNOSIS — I639 Cerebral infarction, unspecified: Secondary | ICD-10-CM

## 2020-01-13 MED ORDER — DILTIAZEM HCL ER COATED BEADS 120 MG PO CP24
120.0000 mg | ORAL_CAPSULE | Freq: Every day | ORAL | Status: DC
  Administered 2020-01-13 – 2020-01-14 (×2): 120 mg via ORAL
  Filled 2020-01-13 (×2): qty 1

## 2020-01-13 MED ORDER — DABIGATRAN ETEXILATE MESYLATE 150 MG PO CAPS
150.0000 mg | ORAL_CAPSULE | Freq: Two times a day (BID) | ORAL | Status: DC
  Administered 2020-01-13 – 2020-01-14 (×3): 150 mg via ORAL
  Filled 2020-01-13 (×4): qty 1

## 2020-01-13 NOTE — Consult Note (Signed)
Physical Medicine and Rehabilitation Consult Reason for Consult: Left side weakness with slurred speech Referring Physician: [facility]   HPI: Mary Mercado is a 79 y.o. right-handed female with history of CAD, PAF on Eliquis, hypertension, hyperlipidemia as well as vertigo.  Per chart review patient lives with spouse.  1 level home.  Independent driving prior to admission.  Presented 01/09/2019 while left-sided weakness and slurred speech.  Cranial CT scan negative.  Patient did not receive TPA.  CT angiogram of head and neck negative for intracranial large vessel occlusion.  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  No associated hemorrhage or mass-effect.  Echocardiogram with ejection fraction of 60 to 65% no wall motion abnormalities grade 2 diastolic dysfunction.  Admission chemistries alcohol negative, glucose 209, BUN 7, urine drug screen negative.  Neurology follow-up currently maintained on aspirin for CVA prophylaxis as well as subcutaneous Lovenox for DVT prophylaxis.  Awaiting plan to resume Eliquis versus change to Pradaxa.  Tolerating a regular diet.  Therapy evaluations completed with recommendations of physical medicine rehab consult.   Review of Systems  Constitutional: Negative for chills and fever.  HENT: Negative for hearing loss.   Eyes: Negative for blurred vision and double vision.  Respiratory: Negative for cough and shortness of breath.   Cardiovascular: Positive for palpitations. Negative for chest pain and leg swelling.  Gastrointestinal: Positive for constipation. Negative for heartburn, nausea and vomiting.  Genitourinary: Negative for dysuria, flank pain and hematuria.  Musculoskeletal: Positive for myalgias.  Skin: Negative for rash.  Neurological: Positive for weakness.       Vertigo  All other systems reviewed and are negative.  Past Medical History:  Diagnosis Date  . Allergy   . CVA (cerebral vascular  accident) ([place])   . Hyperlipidemia LDL goal <70   . Hypertension   . Palpitations   . Vertigo, benign positional    Past Surgical History:  Procedure Laterality Date  . CARDIOVERSION Left 10/31/2018   Procedure: CARDIOVERSION;  Surgeon: Acie Fredrickson Wonda Cheng, MD;  Location: [facility];  Service: Cardiovascular;  Laterality: Left;  . CARDIOVERSION N/A 01/08/2020   Procedure: CARDIOVERSION;  Surgeon: [name], MD;  Location: [facility] ENDOSCOPY;  Service: Cardiovascular;  Laterality: N/A;  . Hemilaminectomy and microdiskectomy at L4-5 on the left.  12/10/2003  . TEE WITHOUT CARDIOVERSION N/A 06/05/2018   Procedure: TRANSESOPHAGEAL ECHOCARDIOGRAM (TEE);  Surgeon: Lelon Perla, MD;  Location: [facility] ENDOSCOPY;  Service: Cardiovascular;  Laterality: N/A;  loop   Family History  Problem Relation Age of Onset  . Colon cancer Father   . Diabetes Mother   . Heart attack Mother        10  . Heart attack Brother 78   Social History:  reports that she has never smoked. She has never used smokeless tobacco. She reports that she does not drink alcohol and does not use drugs. Allergies:  Allergies  Allergen Reactions  . Duloxetine     Causes Bp elevation.  Increased body jerks, nausea  . Acetaminophen Hives and Rash   Medications Prior to Admission  Medication Sig Dispense Refill  . atorvastatin (LIPITOR) 40 MG tablet Take 1 tablet (40 mg total) by mouth daily at 6 PM. 90 tablet 0  . diltiazem (CARDIZEM CD) 180 MG 24 hr capsule TAKE 1 CAPSULE BY MOUTH EVERY DAY (Patient taking differently: Take 180 mg by mouth daily. ) 90 capsule 3  . diltiazem (CARDIZEM) 30 MG  tablet Take 30 mg by mouth 4 (four) times daily as needed (afib).     Marland Kitchen ELIQUIS 5 MG TABS tablet TAKE 1 TABLET BY MOUTH TWICE A DAY (Patient taking differently: Take 5 mg by mouth 2 (two) times daily. ) 60 tablet 10  . flecainide (TAMBOCOR) 50 MG tablet Take 1 tablet (50 mg total) by mouth 2 (two) times daily. 180 tablet 3  .  telmisartan (MICARDIS) 20 MG tablet TAKE 1 TABLET BY MOUTH EVERY DAY (Patient taking differently: Take 10 mg by mouth daily. ) 90 tablet 0    Home: Home Living Family/patient expects to be discharged to:: Private residence Living Arrangements: Spouse/significant other Available Help at Discharge: Family, Available 24 hours/day Type of Home: House Home Access: Level entry Home Layout: One level Bathroom Shower/Tub: Multimedia programmer: Standard Home Equipment: Environmental consultant - 2 wheels, [employer] - single point, Electronics engineer Comments: has not needed AD since last CVA after leaving [facility]  Lives With: Spouse  Functional History: Prior Function Level of Independence: Independent Comments: ADLs, IADLs, driving, enjoys singing and going ot basketball games Functional Status:  Mobility: Bed Mobility Overal bed mobility: Needs Assistance Bed Mobility: Supine to Sit Sidelying to sit: Mod assist Supine to sit: Min guard, HOB elevated Sit to sidelying: Mod assist General bed mobility comments: Min Guard A for safety. HOB elevated.  Transfers Overall transfer level: Needs assistance Equipment used: 1 person hand held assist Transfers: Sit to/from Stand Sit to Stand: Min assist General transfer comment: Min A for gaining balance in standing.  Ambulation/Gait General Gait Details: unable to tolerate    ADL: ADL Overall ADL's : Needs assistance/impaired Eating/Feeding: Set up, Sitting Grooming: Set up, Sitting Upper Body Bathing: Min guard, Sitting Lower Body Bathing: Minimal assistance, Sit to/from stand Upper Body Dressing : Min guard, Sitting Lower Body Dressing: Moderate assistance, Sit to/from stand Toilet Transfer: Minimal assistance, Ambulation (single hand held support) Toilet Transfer Details (indicate cue type and reason): Min A for balance in standing.  Functional mobility during ADLs: Minimal assistance (single hald held A) General ADL Comments: Pt presenting  with decreased cognition, balance, and coorindation impacting her safe performance of ADLs. Providing education on focusing at one objects to decrease dizziness.   Cognition: Cognition Overall Cognitive Status:  (Functional with additional processing time) Arousal/Alertness: Awake/alert Orientation Level: Oriented X4 Attention: Focused, Sustained Focused Attention: Appears intact Sustained Attention: Appears intact Memory: Impaired Memory Impairment: Retrieval deficit, Decreased recall of new information (Immediate: 5/5; delayed: 4/5; with cues: 1/1) Awareness: Appears intact Problem Solving: Appears intact Executive Function: Reasoning, Sequencing, Organizing Reasoning: Appears intact Sequencing:  (Clock drawing: 4/4 with self- correction) Organizing: Impaired Organizing Impairment: Verbal complex (backward digit span: 1/3) Cognition Arousal/Alertness: Awake/alert Behavior During Therapy: Flat affect Overall Cognitive Status:  (Functional with additional processing time) Area of Impairment: Problem solving, Awareness Awareness: Emergent Problem Solving: Slow processing, Requires verbal cues General Comments: Very pleasant. Slight flat affect and reporting nausea. Requiring increased time for processing. Will continue to follow acutely as admitted  Blood pressure (!) 142/81, pulse 69, temperature 98 F (36.7 C), resp. rate 16, SpO2 99 %.  General: Alert and oriented x 3, No apparent distress HEENT: Head is normocephalic, +nystagmus Neck: Supple without JVD or lymphadenopathy Heart: Reg rate and rhythm. No murmurs rubs or gallops Chest: CTA bilaterally without wheezes, rales, or rhonchi; no distress Abdomen: Soft, non-tender, non-distended, bowel sounds positive. Extremities: No clubbing, cyanosis, or edema. Pulses are 2+ Skin: Clean and intact without signs  of breakdown Neuro: Patient is awake alert and makes good eye contact with examiner.  Oriented to person place and time.   Follows commands. 5/5 strength throughout. Impaired FTN on left side.  Psych: Pt's affect is appropriate. Pt is cooperative   Results for orders placed or performed during the hospital encounter of 01/09/20 (from the past 24 hour(s))  CBC     Status: None   Collection Time: 01/12/20  8:03 AM  Result Value Ref Range   WBC 9.2 4.0 - 10.5 K/uL   RBC 4.92 3.87 - 5.11 MIL/uL   Hemoglobin 14.9 12.0 - 15.0 g/dL   HCT 44.7 36 - 46 %   MCV 90.9 80.0 - 100.0 fL   MCH 30.3 26.0 - 34.0 pg   MCHC 33.3 30.0 - 36.0 g/dL   RDW 13.0 11.5 - 15.5 %   Platelets 228 150 - 400 K/uL   nRBC 0.0 0.0 - 0.2 %  Comprehensive metabolic panel     Status: Abnormal   Collection Time: 01/12/20  8:03 AM  Result Value Ref Range   Sodium 137 135 - 145 mmol/L   Potassium 3.6 3.5 - 5.1 mmol/L   Chloride 101 98 - 111 mmol/L   CO2 27 22 - 32 mmol/L   Glucose, Bld 126 (H) 70 - 99 mg/dL   BUN 8 8 - 23 mg/dL   Creatinine, Ser 0.62 0.44 - 1.00 mg/dL   Calcium 9.0 8.9 - 10.3 mg/dL   Total Protein 6.7 6.5 - 8.1 g/dL   Albumin 3.6 3.5 - 5.0 g/dL   AST 20 15 - 41 U/L   ALT 24 0 - 44 U/L   Alkaline Phosphatase 83 38 - 126 U/L   Total Bilirubin 1.6 (H) 0.3 - 1.2 mg/dL   GFR calc non Af Amer >60 >60 mL/min   GFR calc Af Amer >60 >60 mL/min   Anion gap 9 5 - 15   No results found.   Assessment/Plan: Diagnosis: R cerebellar infarct 1. Does the need for close, 24 hr/day medical supervision in concert with the patient's rehab needs make it unreasonable for this patient to be served in a less intensive setting? Yes 2. Co-Morbidities requiring supervision/potential complications: atrial fibrillation, HTN, HLD, dysphagia, post-stroke anxiety 3. Due to bladder management, bowel management, safety, skin/wound care, disease management, medication administration, pain management and patient education, does the patient require 24 hr/day rehab nursing? Yes 4. Does the patient require coordinated care of a physician, rehab nurse,  therapy disciplines of PT, OT to address physical and functional deficits in the context of the above medical diagnosis(es)? Yes Addressing deficits in the following areas: balance, endurance, locomotion, strength, transferring, bowel/bladder control, bathing, dressing, feeding, grooming, toileting and psychosocial support 5. Can the patient actively participate in an intensive therapy program of at least 3 hrs of therapy per day at least 5 days per week? Yes 6. The potential for patient to make measurable gains while on inpatient rehab is excellent 7. Anticipated functional outcomes upon discharge from inpatient rehab are modified independent  with PT, modified independent with OT, independent with SLP. 8. Estimated rehab length of stay to reach the above functional goals is: 14-18 days 9. Anticipated discharge destination: Home 10. Overall Rehab/Functional Prognosis: excellent  RECOMMENDATIONS: This patient's condition is appropriate for continued rehabilitative care in the following setting: CIR Patient has agreed to participate in recommended program. Yes Note that insurance prior authorization may be required for reimbursement for recommended care.  Comment: Thank you  for this consult. Admission coordinator to follow.   I have personally performed a face to face diagnostic evaluation, including, but not limited to relevant history and physical exam findings, of this patient and developed relevant assessment and plan.  Additionally, I have reviewed and concur with the physician assistant's documentation above.  Leeroy Cha, MD  Lavon Paganini [place], PA-C 01/13/2020

## 2020-01-13 NOTE — TOC Benefit Eligibility Note (Signed)
Transition of Care [facility]) Benefit Eligibility Note    Patient Details  Name: Mary Mercado MRN: [id number] Date of Birth: 13-Oct-1940   Medication/Dose: PRADAXA  150 MG BID  Covered?: Yes  Tier:  (TIER- 4 DRUG NON-PREFERRED)  Prescription Coverage Preferred Pharmacy: [facility]  Spoke with Person/Company/Phone Number:: CRYSTAL  @ [employer] # [phone]  Co-Pay: $118.80  Prior Approval: No  Deductible:  (COVERAGE  GAP)       Memory Argue Phone Number: 01/13/2020, 9:50 AM

## 2020-01-13 NOTE — Progress Notes (Signed)
Physical Therapy Treatment Patient Details Name: Mary Mercado MRN: [id number] DOB: January 17, 1941 Today's Date: 01/13/2020    History of Present Illness 79 yo female with onset of ataxia, vertigo and slurred speech was admitted and noted R superior cerebellar artery infarct.  Additionally has urinary retention.  PMHx:  cardioversion, CVA, HTN, palpitations, vertigo,     PT Comments    Patient had mobilized with nursing with RW to bathroom prior to my arrival. Session focused on education for compensatory strategies to manage nausea and dizziness and how her sensory systems work together to maintain her balance. She was able to progress to standing, however became too nauseated and hot to proceed to walking and returned to bed. Patient and husband very appreciative of education and showing good understanding of compensatory strategies.     Follow Up Recommendations  CIR     Equipment Recommendations  None recommended by PT    Recommendations for Other Services Rehab consult     Precautions / Restrictions Precautions Precautions: Fall    Mobility  Bed Mobility Overal bed mobility: Needs Assistance Bed Mobility: Supine to Sit;Sit to Supine     Supine to sit: Min guard;HOB elevated Sit to supine: Supervision   General bed mobility comments: pt preferred come to long-sitting and "get acclimated" then progress legs over EOB to sitting; educated on visual fixation and turning head first to find target, then moving body  Transfers Overall transfer level: Needs assistance Equipment used: 1 person hand held assist Transfers: Sit to/from Stand Sit to Stand: Min assist         General transfer comment: Min A for gaining balance in standing.   Ambulation/Gait             General Gait Details: unable to tolerate; however did walk to bathroom with RW with nursing earlier   Stairs             Wheelchair Mobility    Modified Rankin (Stroke Patients  Only)       Balance Overall balance assessment: Needs assistance Sitting-balance support: Feet supported Sitting balance-Leahy Scale: Fair     Standing balance support: Single extremity supported;During functional activity Standing balance-Leahy Scale: Poor Standing balance comment: fearful to attempt without UE support                            Cognition Arousal/Alertness: Awake/alert Behavior During Therapy: WFL for tasks assessed/performed Overall Cognitive Status: Within Functional Limits for tasks assessed                                        Exercises Other Exercises Other Exercises: Educated to practice turning head left and right while in supported sitting to begin to habituate vestibular system    General Comments General comments (skin integrity, edema, etc.): Husband present. Increased time spent on education re: vestibular system, compensatory techniques  (visual fixation, increasing sensory input to reduce symptoms--supported sitting or supine vs standign)      Pertinent Vitals/Pain Pain Assessment: No/denies pain    Home Living                      Prior Function            PT Goals (current goals can now be found in the care plan section) Acute Rehab PT Goals Patient Stated  Goal: to walk and get home Time For Goal Achievement: 01/24/20 Potential to Achieve Goals: Good Progress towards PT goals: Progressing toward goals    Frequency    Min 4X/week      PT Plan Current plan remains appropriate    Co-evaluation              AM-PAC PT "6 Clicks" Mobility   Outcome Measure  Help needed turning from your back to your side while in a flat bed without using bedrails?: A Little Help needed moving from lying on your back to sitting on the side of a flat bed without using bedrails?: A Lot Help needed moving to and from a bed to a chair (including a wheelchair)?: A Lot Help needed standing up from a chair  using your arms (e.g., wheelchair or bedside chair)?: A Lot Help needed to walk in hospital room?: Total Help needed climbing 3-5 steps with a railing? : Total 6 Click Score: 11    End of Session Equipment Utilized During Treatment: Gait belt Activity Tolerance: Treatment limited secondary to medical complications (Comment) (became hot and nauseated with prolonged stnding) Patient left: in bed;with call bell/phone within reach;with family/visitor present;with bed alarm set Nurse Communication: Mobility status;Other (comment) (compensatory techniques to minimize nausea) PT Visit Diagnosis: Other abnormalities of gait and mobility (R26.89);Ataxic gait (R26.0);Difficulty in walking, not elsewhere classified (R26.2);Other symptoms and signs involving the nervous system (R29.898)     Time: 8828-0034 PT Time Calculation (min) (ACUTE ONLY): 30 min  Charges:  $Neuromuscular Re-education: 23-37 mins                      Arby Barrette, PT Pager [phone]    Rexanne Mano 01/13/2020, 5:15 PM

## 2020-01-13 NOTE — Progress Notes (Addendum)
[facility] PROGRESS NOTE   Ana Woodroof GYI:[id number] DOB: 10-25-1940 DOA: 01/09/2020  PCP: [name], MD  Brief History/Interval Summary: 79 y.o. female with medical history significant of CAD in 2020, paroxysmal A. fib on Eliquis, hypertension, HLD, presented with new onset of vertigo and ataxia.  Was at her cardiologist office this morning for pharmacological stress test, and suddenly developed ataxia, slurred speech and a feeling of vertigo.  Patient has been taking Eliquis 5 mg twice daily, and never missed a dose.  Patient did not complain specifically one-sided weakness or numbness, no vision changes or headache no hearing changes.  Patient was feeling nausea on arrival in the ED which subsided.  Patient also described that " whole body was spinning around me" and has to close her eyes to ease these symptoms.  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  Moderate stenosis of the right P2 segment.  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  Vertiginous symptoms have improved.  Denies any headaches.  No nausea vomiting.     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  CT angiogram was done.  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 Right vertebral artery widely patent.  Neurology consulted.   Patient noted to have tremors involving her right shoulder area.  Likely related to her acute stroke. Patient on Eliquis at home which is currently on hold.   Patient noted to be on aspirin.  Neurology has sent an inquiry to case management to see what patient's co-pay will be if her anticoagulation was changed over to Pradaxa.  If co-pay is high then they recommend continuing with Eliquis. Pradaxa co-pay is $118.80.  Discussed with patient's husband who says that they will be able to afford this.  We will change over to Pradaxa. Stop Aspirin. Continue Lipitor.  LDL 81. Echocardiogram shows normal systolic function with grade 2 diastolic dysfunction.. Urine drug screen unremarkable HbA1c 5.9. PT and OT evaluation.  CIR is recommended.  They have been consulted. Patient remains stable from a neurological standpoint.  Dysphagia, resolved Apparently patient initially failed her swallow screen and subsequently passed it.  Attempts were made to place NG tube which resulted in epistaxis.  Epistaxis has resolved.  Seen by SLP and cleared for diet.  Stable.  Paroxysmal atrial fibrillation/chronic diastolic CHF On Eliquis, flecainide and diltiazem at home.  Continue flecainide.  Diltiazem was on hold to allow permissive hypertension.  Resume today at lower dose.  Essential hypertension Allowing permissive hypertension.  Noted to be on Micardis and diltiazem at home.  Resume diltiazem for now.  Continue to hold Micardis.  History of hyperlipidemia Patient on Lipitor at home.  LDL is 81.  Atorvastatin was increased to 80 mg.  Hyperglycemia Not a fasting level.  HbA1c 5.9.  Hypokalemia Repleted.  Acute urinary retention, resolved Patient had urinary retention when she was in the emergency department requiring in and out catheterization.  UA reviewed.  No evidence for infection.   Appears to have improved. Has been able to void without  difficulty for the last 48 hours.    DVT Prophylaxis:  Lovenox.   Code Status: Full code Family Communication: No family at bedside Disposition Plan: Possible CIR candidate  Status is: Inpatient  Remains inpatient  appropriate because:Ongoing diagnostic testing needed not appropriate for outpatient work up and IV treatments appropriate due to intensity of illness or inability to take PO   Dispo: The patient is from: Home              Anticipated d/c is to: CIR              Anticipated d/c date is: 2 days              Patient currently is not medically stable to d/c.      Medications:  Scheduled: . aspirin EC  81 mg Oral Daily  . atorvastatin  80 mg Oral q1800  . enoxaparin (LOVENOX) injection  40 mg Subcutaneous Q24H  . flecainide  50 mg Oral BID   Continuous:  EHM:CNOBSJG carbonate, famotidine, meclizine, prochlorperazine, senna-docusate   Objective:  Vital Signs  Vitals:   01/12/20 1612 01/12/20 2038 01/12/20 2300 01/13/20 0449  BP: (!) 149/93 (!) 148/88 130/79 (!) 142/81  Pulse: 76 74 71 69  Resp: 18 18  16   Temp: 97.8 F (36.6 C) 98.4 F (36.9 C) (!) 97.5 F (36.4 C) 98 F (36.7 C)  TempSrc: Oral Oral Oral   SpO2: 99% 98% 98% 99%    Intake/Output Summary (Last 24 hours) at 01/13/2020 0857 Last data filed at 01/12/2020 1200 Gross per 24 hour  Intake 560 ml  Output 200 ml  Net 360 ml   There were no vitals filed for this visit.   General appearance: Awake alert.  In no distress Resp: Clear to auscultation bilaterally.  Normal effort Cardio: S1-S2 is normal regular.  No S3-S4.  No rubs murmurs or bruit GI: Abdomen is soft.  Nontender nondistended.  Bowel sounds are present normal.  No masses organomegaly Extremities: No edema.  Neurologic: No obvious facial asymmetry.  Moving her arms and legs equally.    Lab Results:  Data Reviewed: I have personally reviewed following labs and imaging studies  CBC: Recent Labs  Lab 01/09/20 1248 01/09/20 1301 01/11/20 0241 01/12/20 0803  WBC 7.7  --  9.2 9.2  NEUTROABS 4.9  --   --   --   HGB 13.5 13.9 14.0 14.9  HCT 42.1 41.0 43.0 44.7  MCV 92.5  --  91.5 90.9  PLT 262  --  218 283    Basic Metabolic  Panel: Recent Labs  Lab 01/09/20 1301 01/09/20 1325 01/11/20 0241 01/12/20 0803  NA 137 136 137 137  K 3.6 3.5 3.4* 3.6  CL 103 101 101 101  CO2  --  24 24 27   GLUCOSE 168* 209* 125* 126*  BUN 9 7* 7* 8  CREATININE 0.60 0.76 0.68 0.62  CALCIUM  --  8.8* 8.8* 9.0    GFR: Estimated Creatinine Clearance: 50.5 mL/min (by C-G formula based on SCr of 0.62 mg/dL).  Liver Function Tests: Recent Labs  Lab 01/09/20 1325 01/11/20 0241 01/12/20 0803  AST 35 23 20  ALT 29 30 24   ALKPHOS 79 83 83  BILITOT 1.0 1.4* 1.6*  PROT 6.4* 6.5 6.7  ALBUMIN 3.9 3.7 3.6    Coagulation Profile: Recent Labs  Lab 01/09/20 1248  INR 1.3*    CBG: Recent Labs  Lab 01/09/20 1241  GLUCAP 176*  Recent Results (from the past 240 hour(s))  SARS CORONAVIRUS 2 (TAT 6-24 HRS) Nasopharyngeal Nasopharyngeal Swab     Status: None   Collection Time: 01/04/20  1:56 PM   Specimen: Nasopharyngeal Swab  Result Value Ref Range Status   SARS Coronavirus 2 NEGATIVE NEGATIVE Final    Comment: (NOTE) SARS-CoV-2 target nucleic acids are NOT DETECTED.  The SARS-CoV-2 RNA is generally detectable in upper and lower respiratory specimens during the acute phase of infection. Negative results do not preclude SARS-CoV-2 infection, do not rule out co-infections with other pathogens, and should not be used as the sole basis for treatment or other patient management decisions. Negative results must be combined with clinical observations, patient history, and epidemiological information. The expected result is Negative.  Fact Sheet for Patients: SugarRoll.be  Fact Sheet for Healthcare Providers: https://www.woods-mathews.com/  This test is not yet approved or cleared by the [place] FDA and  has been authorized for detection and/or diagnosis of SARS-CoV-2 by FDA under an Emergency Use Authorization (EUA). This EUA will remain  in effect (meaning this test  can be used) for the duration of the COVID-19 declaration under Se ction 564(b)(1) of the Act, 21 U.S.C. section 360bbb-3(b)(1), unless the authorization is terminated or revoked sooner.  Performed at [facility], [place] [address] [place], [place] [address]   SARS Coronavirus 2 by RT PCR (hospital order, performed in [facility] hospital lab) Nasopharyngeal Nasopharyngeal Swab     Status: None   Collection Time: 01/09/20  5:00 PM   Specimen: Nasopharyngeal Swab  Result Value Ref Range Status   SARS Coronavirus 2 NEGATIVE NEGATIVE Final    Comment: (NOTE) SARS-CoV-2 target nucleic acids are NOT DETECTED.  The SARS-CoV-2 RNA is generally detectable in upper and lower respiratory specimens during the acute phase of infection. The lowest concentration of SARS-CoV-2 viral copies this assay can detect is 250 copies / mL. A negative result does not preclude SARS-CoV-2 infection and should not be used as the sole basis for treatment or other patient management decisions.  A negative result may occur with improper specimen collection / handling, submission of specimen other than nasopharyngeal swab, presence of viral mutation(s) within the areas targeted by this assay, and inadequate number of viral copies (<250 copies / mL). A negative result must be combined with clinical observations, patient history, and epidemiological information.  Fact Sheet for Patients:   StrictlyIdeas.no  Fact Sheet for Healthcare Providers: BankingDealers.co.za  This test is not yet approved or  cleared by the [place] FDA and has been authorized for detection and/or diagnosis of SARS-CoV-2 by FDA under an Emergency Use Authorization (EUA).  This EUA will remain in effect (meaning this test can be used) for the duration of the COVID-19 declaration under Section 564(b)(1) of the Act, 21 U.S.C. section 360bbb-3(b)(1), unless the authorization is  terminated or revoked sooner.  Performed at [facility], [place] [address], [place], [place] [address]       Radiology Studies: No results found.     LOS: 4 days   Pravin Perezperez [employer] on [email or web address]  01/13/2020, 8:57 AM

## 2020-01-13 NOTE — Progress Notes (Signed)
Inpatient Rehab Admissions Coordinator:   I met with Pt. At bedside to discuss potential CIR admit. MD stated that Pt.'s work up is complete and that Pt. Is medically stable to d/c to CIR. I do not have a bed available today, but will follow for potential admission this week, pending bed availability.   Laura Staley, MS, CCC-SLP Rehab Admissions Coordinator  [phone] (celll) [phone] (office)  

## 2020-01-13 NOTE — Progress Notes (Signed)
ANTICOAGULATION CONSULT NOTE - Initial Consult  Pharmacy Consult for Pradaxa Indication: atrial fibrillation and stroke  Allergies  Allergen Reactions  . Duloxetine     Causes Bp elevation.  Increased body jerks, nausea  . Acetaminophen Hives and Rash    Patient Measurements: Height: 5\' 5"  (165.1 cm) Weight: 64.7 kg (142 lb 10.2 oz) IBW/kg (Calculated) : 57  Vital Signs: Temp: 97.8 F (36.6 C) (09/20 0911) Temp Source: Oral (09/20 0911) BP: 146/82 (09/20 0911) Pulse Rate: 86 (09/20 0911)  Labs: Recent Labs    01/11/20 0241 01/12/20 0803  HGB 14.0 14.9  HCT 43.0 44.7  PLT 218 228  CREATININE 0.68 0.62    Estimated Creatinine Clearance: 50.5 mL/min (by C-G formula based on SCr of 0.62 mg/dL).   Medical History: Past Medical History:  Diagnosis Date  . Allergy   . CVA (cerebral vascular accident) ([place])   . Hyperlipidemia LDL goal <70   . Hypertension   . Palpitations   . Vertigo, benign positional    Assessment:  79 yr old female admitted 01/10/20 with new stroke. Had been taking Eliquis 5 mg BID PTA for atrial fibrillation.  Now changing to Pradaxa.   Has been on Lovenox 40 mg SQ q24hrs for VTE prophylaxis.  Last Lovenox dose 9/19 at 12n.  Goal of Therapy:  appropriate Pradaxa dose for indication Monitor platelets by anticoagulation protocol: Yes   Plan:   Discontinue Lovenox.  Begin Pradaxa 150 mg PO BID.   Arty Baumgartner, [place] Phone: [phone] 01/13/2020,11:24 AM

## 2020-01-14 ENCOUNTER — Other Ambulatory Visit: Payer: Self-pay

## 2020-01-14 ENCOUNTER — Encounter (HOSPITAL_COMMUNITY): Payer: Self-pay | Admitting: Physical Medicine & Rehabilitation

## 2020-01-14 ENCOUNTER — Inpatient Hospital Stay (HOSPITAL_COMMUNITY)
Admission: RE | Admit: 2020-01-14 | Discharge: 2020-01-22 | DRG: 093 | Disposition: A | Discharge status: Home / Self Care | Payer: Medicare Other | Source: Intra-hospital | Attending: Physical Medicine & Rehabilitation | Admitting: Physical Medicine & Rehabilitation

## 2020-01-14 DIAGNOSIS — Z8673 Personal history of transient ischemic attack (TIA), and cerebral infarction without residual deficits: Secondary | ICD-10-CM | POA: Diagnosis not present

## 2020-01-14 DIAGNOSIS — Z886 Allergy status to analgesic agent status: Secondary | ICD-10-CM

## 2020-01-14 DIAGNOSIS — R2689 Other abnormalities of gait and mobility: Secondary | ICD-10-CM | POA: Diagnosis present

## 2020-01-14 DIAGNOSIS — I69311 Memory deficit following cerebral infarction: Secondary | ICD-10-CM | POA: Diagnosis not present

## 2020-01-14 DIAGNOSIS — I2510 Atherosclerotic heart disease of native coronary artery without angina pectoris: Secondary | ICD-10-CM | POA: Diagnosis present

## 2020-01-14 DIAGNOSIS — I480 Paroxysmal atrial fibrillation: Secondary | ICD-10-CM | POA: Diagnosis present

## 2020-01-14 DIAGNOSIS — Z7901 Long term (current) use of anticoagulants: Secondary | ICD-10-CM

## 2020-01-14 DIAGNOSIS — R278 Other lack of coordination: Secondary | ICD-10-CM | POA: Diagnosis present

## 2020-01-14 DIAGNOSIS — R7303 Prediabetes: Secondary | ICD-10-CM | POA: Diagnosis present

## 2020-01-14 DIAGNOSIS — I69322 Dysarthria following cerebral infarction: Secondary | ICD-10-CM

## 2020-01-14 DIAGNOSIS — Z888 Allergy status to other drugs, medicaments and biological substances status: Secondary | ICD-10-CM

## 2020-01-14 DIAGNOSIS — I63441 Cerebral infarction due to embolism of right cerebellar artery: Secondary | ICD-10-CM | POA: Diagnosis present

## 2020-01-14 DIAGNOSIS — I4891 Unspecified atrial fibrillation: Secondary | ICD-10-CM | POA: Diagnosis present

## 2020-01-14 DIAGNOSIS — E785 Hyperlipidemia, unspecified: Secondary | ICD-10-CM | POA: Diagnosis present

## 2020-01-14 DIAGNOSIS — I639 Cerebral infarction, unspecified: Secondary | ICD-10-CM | POA: Diagnosis not present

## 2020-01-14 DIAGNOSIS — E876 Hypokalemia: Secondary | ICD-10-CM | POA: Diagnosis present

## 2020-01-14 DIAGNOSIS — I69392 Facial weakness following cerebral infarction: Secondary | ICD-10-CM | POA: Diagnosis not present

## 2020-01-14 DIAGNOSIS — Z79899 Other long term (current) drug therapy: Secondary | ICD-10-CM

## 2020-01-14 DIAGNOSIS — I358 Other nonrheumatic aortic valve disorders: Secondary | ICD-10-CM | POA: Diagnosis present

## 2020-01-14 DIAGNOSIS — I10 Essential (primary) hypertension: Secondary | ICD-10-CM | POA: Diagnosis present

## 2020-01-14 LAB — COMPREHENSIVE METABOLIC PANEL
ALT: 16 U/L (ref 0–44)
AST: 14 U/L — ABNORMAL LOW (ref 15–41)
Albumin: 3.3 g/dL — ABNORMAL LOW (ref 3.5–5.0)
Alkaline Phosphatase: 74 U/L (ref 38–126)
Anion gap: 11 (ref 5–15)
BUN: 8 mg/dL (ref 8–23)
CO2: 27 mmol/L (ref 22–32)
Calcium: 9 mg/dL (ref 8.9–10.3)
Chloride: 103 mmol/L (ref 98–111)
Creatinine, Ser: 0.65 mg/dL (ref 0.44–1.00)
GFR calc Af Amer: >60 mL/min (ref >60)
GFR calc non Af Amer: >60 mL/min (ref >60)
Glucose, Bld: 110 mg/dL — ABNORMAL HIGH (ref 70–99)
Potassium: 3.1 mmol/L — ABNORMAL LOW (ref 3.5–5.1)
Sodium: 141 mmol/L (ref 135–145)
Total Bilirubin: 1.3 mg/dL — ABNORMAL HIGH (ref 0.3–1.2)
Total Protein: 6 g/dL — ABNORMAL LOW (ref 6.5–8.1)

## 2020-01-14 LAB — CBC
HCT: 42.5 % (ref 36.0–46.0)
Hemoglobin: 14.2 g/dL (ref 12.0–15.0)
MCH: 30.4 pg (ref 26.0–34.0)
MCHC: 33.4 g/dL (ref 30.0–36.0)
MCV: 91 fL (ref 80.0–100.0)
Platelets: 233 10*3/uL (ref 150–400)
RBC: 4.67 MIL/uL (ref 3.87–5.11)
RDW: 13 % (ref 11.5–15.5)
WBC: 7.3 10*3/uL (ref 4.0–10.5)
nRBC: 0 % (ref 0.0–0.2)

## 2020-01-14 LAB — MAGNESIUM: Magnesium: 1.8 mg/dL (ref 1.7–2.4)

## 2020-01-14 MED ORDER — SENNOSIDES-DOCUSATE SODIUM 8.6-50 MG PO TABS: 1.0000 | ORAL_TABLET | Freq: Every evening | ORAL | Status: DC | PRN

## 2020-01-14 MED ORDER — BISACODYL 10 MG RE SUPP: 10.0000 mg | Freq: Every day | RECTAL | Status: DC | PRN

## 2020-01-14 MED ORDER — TRAZODONE HCL 50 MG PO TABS: 25.0000 mg | ORAL_TABLET | Freq: Every evening | ORAL | Status: DC | PRN

## 2020-01-14 MED ORDER — PROCHLORPERAZINE EDISYLATE 10 MG/2ML IJ SOLN: 5.0000 mg | Freq: Four times a day (QID) | INTRAMUSCULAR | Status: DC | PRN

## 2020-01-14 MED ORDER — DABIGATRAN ETEXILATE MESYLATE 150 MG PO CAPS
150.0000 mg | ORAL_CAPSULE | Freq: Two times a day (BID) | ORAL | Status: DC
  Administered 2020-01-14 – 2020-01-22 (×16): 150 mg via ORAL
  Filled 2020-01-14 (×16): qty 1

## 2020-01-14 MED ORDER — ALUM & MAG HYDROXIDE-SIMETH 200-200-20 MG/5ML PO SUSP
30.0000 mL | ORAL | Status: DC | PRN
  Administered 2020-01-19: 30 mL via ORAL
  Filled 2020-01-14: qty 30

## 2020-01-14 MED ORDER — POLYETHYLENE GLYCOL 3350 17 G PO PACK: 17.0000 g | PACK | Freq: Every day | ORAL | Status: DC | PRN

## 2020-01-14 MED ORDER — GUAIFENESIN-DM 100-10 MG/5ML PO SYRP: 5.0000 mL | ORAL_SOLUTION | Freq: Four times a day (QID) | ORAL | Status: DC | PRN

## 2020-01-14 MED ORDER — PROCHLORPERAZINE MALEATE 5 MG PO TABS: 5.0000 mg | ORAL_TABLET | Freq: Four times a day (QID) | ORAL | Status: DC | PRN

## 2020-01-14 MED ORDER — POTASSIUM CHLORIDE CRYS ER 20 MEQ PO TBCR
40.0000 meq | EXTENDED_RELEASE_TABLET | Freq: Once | ORAL | Status: DC
  Filled 2020-01-14: qty 2

## 2020-01-14 MED ORDER — DIPHENHYDRAMINE HCL 12.5 MG/5ML PO ELIX: 12.5000 mg | ORAL_SOLUTION | Freq: Four times a day (QID) | ORAL | Status: DC | PRN

## 2020-01-14 MED ORDER — CALCIUM CARBONATE ANTACID 500 MG PO CHEW: 800.0000 mg | CHEWABLE_TABLET | Freq: Every day | ORAL | Status: DC | PRN

## 2020-01-14 MED ORDER — PROCHLORPERAZINE EDISYLATE 10 MG/2ML IJ SOLN: 10.0000 mg | Freq: Four times a day (QID) | INTRAMUSCULAR | Status: DC | PRN

## 2020-01-14 MED ORDER — PROCHLORPERAZINE 25 MG RE SUPP: 12.5000 mg | Freq: Four times a day (QID) | RECTAL | Status: DC | PRN

## 2020-01-14 MED ORDER — FAMOTIDINE 20 MG PO TABS: 10.0000 mg | ORAL_TABLET | Freq: Every day | ORAL | Status: DC | PRN

## 2020-01-14 MED ORDER — FLEET ENEMA 7-19 GM/118ML RE ENEM: 1.0000 | ENEMA | Freq: Once | RECTAL | Status: DC | PRN

## 2020-01-14 MED ORDER — POTASSIUM CHLORIDE CRYS ER 20 MEQ PO TBCR
20.0000 meq | EXTENDED_RELEASE_TABLET | Freq: Two times a day (BID) | ORAL | Status: AC
  Administered 2020-01-14 – 2020-01-15 (×2): 20 meq via ORAL
  Filled 2020-01-14 (×2): qty 1

## 2020-01-14 MED ORDER — POTASSIUM CHLORIDE CRYS ER 20 MEQ PO TBCR
40.0000 meq | EXTENDED_RELEASE_TABLET | Freq: Once | ORAL | Status: AC
  Administered 2020-01-14: 40 meq via ORAL
  Filled 2020-01-14: qty 2

## 2020-01-14 MED ORDER — ATORVASTATIN CALCIUM 80 MG PO TABS
80.0000 mg | ORAL_TABLET | Freq: Every day | ORAL | Status: DC
  Administered 2020-01-14 – 2020-01-21 (×8): 80 mg via ORAL
  Filled 2020-01-14 (×8): qty 1

## 2020-01-14 MED ORDER — MECLIZINE HCL 25 MG PO TABS: 12.5000 mg | ORAL_TABLET | Freq: Three times a day (TID) | ORAL | Status: DC | PRN

## 2020-01-14 MED ORDER — DILTIAZEM HCL ER COATED BEADS 120 MG PO CP24
120.0000 mg | ORAL_CAPSULE | Freq: Every day | ORAL | Status: DC
  Administered 2020-01-15 – 2020-01-22 (×8): 120 mg via ORAL
  Filled 2020-01-14 (×9): qty 1

## 2020-01-14 MED ORDER — FLECAINIDE ACETATE 50 MG PO TABS
50.0000 mg | ORAL_TABLET | Freq: Two times a day (BID) | ORAL | Status: DC
  Administered 2020-01-14 – 2020-01-22 (×16): 50 mg via ORAL
  Filled 2020-01-14 (×16): qty 1

## 2020-01-14 NOTE — Progress Notes (Signed)
Inpatient Rehabilitation Medication Review by a Pharmacist  A complete drug regimen review was completed for this patient to identify any potential clinically significant medication issues.  Clinically significant medication issues were identified:  no  Check AMION for pharmacist assigned to patient if future medication questions/issues arise during this admission.  Pharmacist comments:   Time spent performing this drug regimen review (minutes):  10    [place] 01/14/2020 3:48 PM

## 2020-01-14 NOTE — Progress Notes (Signed)
Inpatient Rehab Admissions Coordinator:   I have a CIR bed available for this Pt. Today. I have notified patient and obtained necessary consents. Her husband was present in room and confirms that he is in agreement for Pt. To come to CIR this afternoon.  Clemens Catholic, [place], [place] Admissions Coordinator  [phone] ([place]) [phone] (office)

## 2020-01-14 NOTE — Progress Notes (Signed)
Patient arrived on unit. Reviewed rehab schedule, medications and plan of care.  No complications noted at this time.  A&O x 4.

## 2020-01-14 NOTE — Discharge Summary (Signed)
[facility]  Physician Discharge Summary   Patient ID: Parissa Chiao MRN: [id number] DOB/AGE: 12-13-1940 79 y.o.  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 Please check basic metabolic panel in the next 24 to 48 hours 2. See below regarding blood pressure management   Home Health: Going to CIR Equipment/Devices: Per CIR  CODE STATUS: Full code  DISCHARGE CONDITION: fair  Diet recommendation: Heart healthy  INITIAL HISTORY: 79 y.o.femalewith medical history significant ofCAD in 2020, paroxysmal A. fib on Eliquis, hypertension, HLD, presented with new onset of vertigo and ataxia. Was at her cardiologist office this morning for pharmacological stress test, and suddenly developed ataxia, slurred speech and a feeling of vertigo. Patient has been taking Eliquis 5 mg twice daily, and never missed a dose. Patient did not complain specifically one-sided weakness or numbness, no vision changes or headache no hearing changes. Patient was feeling nausea on arrival in the ED which subsided. Patient also described that "whole body was spinning around me"and has to close her eyes to ease thesesymptoms.  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 MRI of the brain acute 3 cm infarct in the right superior cerebellar artery  Consultations: Neurology   HOSPITAL COURSE:   Acute stroke involving the right cerebellum with ataxia MRI revealed an acute infarct in the  right superior cerebellar artery territory.  CT angiogram was done.  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 Right vertebral artery widely patent.  Neurology consulted.   Patient noted to have tremors involving her right shoulder area.  Likely related to her acute stroke. Patient was on Eliquis at home.  Started on aspirin in the hospital.  Neurology recommended considering changing to Pradaxa if patient was able to afford it.  Patient and husband mentioned that they will be able to afford the co-pay.  Changed over to Pradaxa.  Aspirin discontinued. Continue Lipitor.  LDL 81. Echocardiogram shows normal systolic function with grade 2 diastolic dysfunction.. Urine drug screen unremarkable HbA1c 5.9.  Dysphagia, resolved Apparently patient initially failed her swallow screen and subsequently passed it.  Attempts were made to place NG tube which resulted in epistaxis.  Epistaxis has resolved.  Seen by SLP and cleared for diet.  Stable.  Paroxysmal atrial fibrillation/chronic diastolic CHF On Eliquis, flecainide and diltiazem at home.  Started back on flecainide.  Diltiazem started at a lower dose.  Could increase it back to her home dose depending on her blood pressures.  Essential hypertension Permissive hypertension was allowed.  Continue diltiazem.  Continue to hold Micardis.  If her blood pressure remains poorly controlled will recommend first increasing diltiazem to her usual home dose and then add Micardis if BP remains poorly controlled.     History of hyperlipidemia Patient on Lipitor at home.  LDL is 81.  Atorvastatin was increased to 80 mg.  Hyperglycemia Not a fasting level.  HbA1c 5.9.  Hypokalemia Will be repleted today.  Magnesium is 1.8.  Will recommend rechecking labs in 24 to 48 hours..  Acute urinary retention, resolved Patient had urinary retention when she was in the emergency department requiring  in and out catheterization.  UA reviewed.  No  evidence for infection.   Appears to have resolved.  Voiding on her own.   Overall stable.  Okay for discharge to CIR.   PERTINENT LABS:  The results of significant diagnostics from this hospitalization (including imaging, microbiology, ancillary and laboratory) are listed below for reference.    Microbiology: Recent Results (from the past 240 hour(s))  SARS CORONAVIRUS 2 (TAT 6-24 HRS) Nasopharyngeal Nasopharyngeal Swab     Status: None   Collection Time: 01/04/20  1:56 PM   Specimen: Nasopharyngeal Swab  Result Value Ref Range Status   SARS Coronavirus 2 NEGATIVE NEGATIVE Final    Comment: (NOTE) SARS-CoV-2 target nucleic acids are NOT DETECTED.  The SARS-CoV-2 RNA is generally detectable in upper and lower respiratory specimens during the acute phase of infection. Negative results do not preclude SARS-CoV-2 infection, do not rule out co-infections with other pathogens, and should not be used as the sole basis for treatment or other patient management decisions. Negative results must be combined with clinical observations, patient history, and epidemiological information. The expected result is Negative.  Fact Sheet for Patients: SugarRoll.be  Fact Sheet for Healthcare Providers: https://www.woods-mathews.com/  This test is not yet approved or cleared by the [place] FDA and  has been authorized for detection and/or diagnosis of SARS-CoV-2 by FDA under an Emergency Use Authorization (EUA). This EUA will remain  in effect (meaning this test can be used) for the duration of the COVID-19 declaration under Se ction 564(b)(1) of the Act, 21 U.S.C. section 360bbb-3(b)(1), unless the authorization is terminated or revoked sooner.  Performed at [facility], [place] [address], [place], [place] [address]   SARS Coronavirus 2 by RT PCR (hospital order, performed in [facility] hospital lab) Nasopharyngeal Nasopharyngeal Swab      Status: None   Collection Time: 01/09/20  5:00 PM   Specimen: Nasopharyngeal Swab  Result Value Ref Range Status   SARS Coronavirus 2 NEGATIVE NEGATIVE Final    Comment: (NOTE) SARS-CoV-2 target nucleic acids are NOT DETECTED.  The SARS-CoV-2 RNA is generally detectable in upper and lower respiratory specimens during the acute phase of infection. The lowest concentration of SARS-CoV-2 viral copies this assay can detect is 250 copies / mL. A negative result does not preclude SARS-CoV-2 infection and should not be used as the sole basis for treatment or other patient management decisions.  A negative result may occur with improper specimen collection / handling, submission of specimen other than nasopharyngeal swab, presence of viral mutation(s) within the areas targeted by this assay, and inadequate number of viral copies (<250 copies / mL). A negative result must be combined with clinical observations, patient history, and epidemiological information.  Fact Sheet for Patients:   StrictlyIdeas.no  Fact Sheet for Healthcare Providers: BankingDealers.co.za  This test is not yet approved or  cleared by the [place] FDA and has been authorized for detection and/or diagnosis of SARS-CoV-2 by FDA under an Emergency Use Authorization (EUA).  This EUA will remain in effect (meaning this test can be used) for the duration of the COVID-19 declaration under Section 564(b)(1) of the Act, 21 U.S.C. section 360bbb-3(b)(1), unless the authorization is terminated or revoked sooner.  Performed at [facility], [place] [address], [place], [place] [address]      Labs:    Basic Metabolic Panel: Recent Labs  Lab 01/09/20 1301 01/09/20 1325 01/11/20 0241 01/12/20 0803 01/14/20 0433  NA 137 136 137  137 141  K 3.6 3.5 3.4* 3.6 3.1*  CL 103 101 101 101 103  CO2  --  24 24 27 27   GLUCOSE 168* 209* 125* 126* 110*  BUN 9 7* 7* 8 8    CREATININE 0.60 0.76 0.68 0.62 0.65  CALCIUM  --  8.8* 8.8* 9.0 9.0  MG  --   --   --   --  1.8   Liver Function Tests: Recent Labs  Lab 01/09/20 1325 01/11/20 0241 01/12/20 0803 01/14/20 0433  AST 35 23 20 14*  ALT 29 30 24 16   ALKPHOS 79 83 83 74  BILITOT 1.0 1.4* 1.6* 1.3*  PROT 6.4* 6.5 6.7 6.0*  ALBUMIN 3.9 3.7 3.6 3.3*   CBC: Recent Labs  Lab 01/09/20 1248 01/09/20 1301 01/11/20 0241 01/12/20 0803 01/14/20 0433  WBC 7.7  --  9.2 9.2 7.3  NEUTROABS 4.9  --   --   --   --   HGB 13.5 13.9 14.0 14.9 14.2  HCT 42.1 41.0 43.0 44.7 42.5  MCV 92.5  --  91.5 90.9 91.0  PLT 262  --  218 228 233    CBG: Recent Labs  Lab 01/09/20 1241  GLUCAP 176*     IMAGING STUDIES CT Code Stroke CTA Head W/WO contrast  Result Date: 01/09/2020 CLINICAL DATA:  Focal neuro deficit.  Dizziness. EXAM: CT ANGIOGRAPHY HEAD AND NECK TECHNIQUE: Multidetector CT imaging of the head and neck was performed using the standard protocol during bolus administration of intravenous contrast. Multiplanar CT image reconstructions and MIPs were obtained to evaluate the vascular anatomy. Carotid stenosis measurements (when applicable) are obtained utilizing NASCET criteria, using the distal internal carotid diameter as the denominator. CONTRAST:  13mL OMNIPAQUE IOHEXOL 350 MG/ML SOLN 75 mL Omnipaque 350 IV COMPARISON:  CT head 01/09/2020 FINDINGS: CTA NECK FINDINGS Aortic arch: Atherosclerotic calcification aortic arch. Atherosclerotic calcification in the great vessels especially left subclavian artery without significant stenosis. Right carotid system: Atherosclerotic disease at the right carotid bifurcation. 25% diameter stenosis proximal right internal carotid artery. Left carotid system: Atherosclerotic calcification left carotid bifurcation without stenosis. Vertebral arteries: Right vertebral artery dominant and without stenosis. Moderate to severe stenosis at the origin of the left vertebral artery.  Left vertebral artery is then patent to the basilar without additional stenosis. Skeleton: No acute abnormality. Other neck: Negative for mass or adenopathy. Upper chest: Lung apices clear bilaterally. Review of the MIP images confirms the above findings CTA HEAD FINDINGS Anterior circulation: Atherosclerotic calcification throughout the cavernous carotid bilaterally with mild to moderate stenosis bilaterally. Anterior and middle cerebral arteries patent bilaterally without large vessel occlusion or flow limiting stenosis. Posterior circulation: Both vertebral arteries patent to the basilar. PICA patent bilaterally. Basilar widely patent. Mild atherosclerotic irregularity in the basilar. AICA, superior cerebellar, and posterior cerebral arteries patent bilaterally without large vessel occlusion. Moderate stenosis right P2 segment. Left posterior cerebral artery patent without stenosis. Venous sinuses: Normal venous enhancement. Anatomic variants: None Review of the MIP images confirms the above findings IMPRESSION: 1. Negative for intracranial large vessel occlusion 2. 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 Mild to moderate atherosclerotic stenosis in the supraclinoid internal carotid artery bilaterally. 3. 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 Right vertebral artery widely patent 4. Moderate stenosis right P2 segment. 5. These results were called by telephone at the time of interpretation on 01/09/2020 at 1:15 pm to provider MCNEILL [facility] , who verbally acknowledged these results. Electronically  Signed   By: Franchot Gallo M.D.   On: 01/09/2020 13:16   CT Code Stroke CTA Neck W/WO contrast  Result Date: 01/09/2020 CLINICAL DATA:  Focal neuro deficit.  Dizziness. EXAM: CT ANGIOGRAPHY HEAD AND NECK TECHNIQUE: Multidetector CT imaging of the head and neck was performed using the standard protocol during bolus administration of  intravenous contrast. Multiplanar CT image reconstructions and MIPs were obtained to evaluate the vascular anatomy. Carotid stenosis measurements (when applicable) are obtained utilizing NASCET criteria, using the distal internal carotid diameter as the denominator. CONTRAST:  34mL OMNIPAQUE IOHEXOL 350 MG/ML SOLN 75 mL Omnipaque 350 IV COMPARISON:  CT head 01/09/2020 FINDINGS: CTA NECK FINDINGS Aortic arch: Atherosclerotic calcification aortic arch. Atherosclerotic calcification in the great vessels especially left subclavian artery without significant stenosis. Right carotid system: Atherosclerotic disease at the right carotid bifurcation. 25% diameter stenosis proximal right internal carotid artery. Left carotid system: Atherosclerotic calcification left carotid bifurcation without stenosis. Vertebral arteries: Right vertebral artery dominant and without stenosis. Moderate to severe stenosis at the origin of the left vertebral artery. Left vertebral artery is then patent to the basilar without additional stenosis. Skeleton: No acute abnormality. Other neck: Negative for mass or adenopathy. Upper chest: Lung apices clear bilaterally. Review of the MIP images confirms the above findings CTA HEAD FINDINGS Anterior circulation: Atherosclerotic calcification throughout the cavernous carotid bilaterally with mild to moderate stenosis bilaterally. Anterior and middle cerebral arteries patent bilaterally without large vessel occlusion or flow limiting stenosis. Posterior circulation: Both vertebral arteries patent to the basilar. PICA patent bilaterally. Basilar widely patent. Mild atherosclerotic irregularity in the basilar. AICA, superior cerebellar, and posterior cerebral arteries patent bilaterally without large vessel occlusion. Moderate stenosis right P2 segment. Left posterior cerebral artery patent without stenosis. Venous sinuses: Normal venous enhancement. Anatomic variants: None Review of the MIP images  confirms the above findings IMPRESSION: 1. Negative for intracranial large vessel occlusion 2. 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 Mild to moderate atherosclerotic stenosis in the supraclinoid internal carotid artery bilaterally. 3. 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 Right vertebral artery widely patent 4. Moderate stenosis right P2 segment. 5. These results were called by telephone at the time of interpretation on 01/09/2020 at 1:15 pm to provider MCNEILL [facility] , who verbally acknowledged these results. Electronically Signed   By: Franchot Gallo M.D.   On: 01/09/2020 13:16   MR BRAIN WO CONTRAST  Result Date: 01/09/2020 CLINICAL DATA:  79 year old female code stroke presentation this morning. Headache and vomiting. EXAM: MRI HEAD WITHOUT CONTRAST TECHNIQUE: Multiplanar, multiecho pulse sequences of the brain and surrounding structures were obtained without intravenous contrast. COMPARISON:  CT head and CTA head and neck earlier today. Brain MRI 06/01/2018. FINDINGS: Brain: Patchy 3 cm area of restricted diffusion in the right superior cerebellar artery territory (series 3, image 14). Minimal T2 and FLAIR hyperintensity. No associated hemorrhage or mass effect. Superimposed chronic microhemorrhage elsewhere in the right cerebellar hemisphere. No other convincing posterior fossa restricted diffusion. Expected evolution of the posterior right frontal lobe/posterior operculum infarct last year with encephalomalacia there now. No supratentorial restricted diffusion today. Elsewhere stable gray and white matter signal including Patchy and confluent bilateral cerebral white matter and pontine T2 and FLAIR hyperintensity.-no other chronic cerebral blood products. No midline shift, mass effect, evidence of mass lesion, ventriculomegaly, extra-axial collection or acute intracranial hemorrhage. Cervicomedullary junction and pituitary  are within normal limits. Vascular: Major intracranial vascular flow voids are stable since last year.  Skull and upper cervical spine: Negative for age visible cervical spine. Visualized bone marrow signal is within normal limits. Sinuses/Orbits: Stable, negative. Other: Mastoids remain clear. Grossly stable and normal visible internal auditory structures. Visible scalp and face appear negative. IMPRESSION: 1. Acute 3 cm infarct in the Right Superior Cerebellar Artery territory. No associated hemorrhage or mass effect. 2. Expected evolution of the right MCA infarcts last year. Otherwise stable underlying chronic small vessel disease. Electronically Signed   By: Genevie Ann M.D.   On: 01/09/2020 15:42   MYOCARDIAL PERFUSION IMAGING  Result Date: 01/09/2020 Please see documentation in chart; this is a non-gated rest-only study. Patient had rest images completed, which are without perfusion defects. Prior to acquisition of stress images, patient had event concerning for acute stroke and was urgently transported to the hospital via EMS.  ECHOCARDIOGRAM COMPLETE  Result Date: 01/10/2020    ECHOCARDIOGRAM REPORT   Patient Name:   MERRITT KIBBY VZDGLOVF Date of Exam: 01/10/2020 Medical Rec #:  [id number]                 Height:       65.0 in Accession #:    [id number]                Weight:       142.0 lb Date of Birth:  11/04/1940                 BSA:          1.710 m Patient Age:    37 years                  BP:           148/85 mmHg Patient Gender: F                         HR:           83 bpm. Exam Location:  Inpatient Procedure: 2D Echo, 3D Echo, Color Doppler and Cardiac Doppler Indications:    TIA  History:        Patient has prior history of Echocardiogram examinations, most                 recent 06/06/2019. Arrythmias:Atrial Fibrillation; Risk                 Factors:COVID+ 08/2018, Hypertension and Dyslipidemia. 2 days                 post cardioversion per patient.  Sonographer:    Raquel Sarna Senior RDCS  Referring Phys: [id number] [place]  1. Left ventricular ejection fraction, by estimation, is 60 to 65%. The left ventricle has normal function. The left ventricle has no regional wall motion abnormalities. Left ventricular diastolic parameters are consistent with Grade II diastolic dysfunction (pseudonormalization). Elevated left atrial pressure.  2. Right ventricular systolic function is normal. The right ventricular size is normal. There is moderately elevated pulmonary artery systolic pressure. The estimated right ventricular systolic pressure is 35.5 mmHg.  3. Left atrial size was severely dilated.  4. Right atrial size was severely dilated.  5. The mitral valve is degenerative. Mild mitral valve regurgitation. No evidence of mitral stenosis.  6. Tricuspid valve regurgitation is moderate to severe.  7. The aortic valve is tricuspid. There is mild calcification of the aortic valve. There is mild thickening of the aortic valve. Aortic valve regurgitation is not visualized. Mild to moderate  aortic valve sclerosis/calcification is present, without any evidence of aortic stenosis.  8. The inferior vena cava is normal in size with <50% respiratory variability, suggesting right atrial pressure of 8 mmHg. Comparison(s): No significant change from prior study. FINDINGS  Left Ventricle: Left ventricular ejection fraction, by estimation, is 60 to 65%. The left ventricle has normal function. The left ventricle has no regional wall motion abnormalities. The left ventricular internal cavity size was normal in size. There is  no left ventricular hypertrophy. Left ventricular diastolic parameters are consistent with Grade II diastolic dysfunction (pseudonormalization). Elevated left atrial pressure. Right Ventricle: The right ventricular size is normal. No increase in right ventricular wall thickness. Right ventricular systolic function is normal. There is moderately elevated pulmonary artery systolic pressure.  The tricuspid regurgitant velocity is 3.48 m/s, and with an assumed right atrial pressure of 8 mmHg, the estimated right ventricular systolic pressure is 46.2 mmHg. Left Atrium: Left atrial size was severely dilated. Right Atrium: Right atrial size was severely dilated. Pericardium: Trivial pericardial effusion is present. Mitral Valve: The mitral valve is degenerative in appearance. There is moderate calcification of the posterior mitral valve leaflet(s). Mild mitral annular calcification. Mild mitral valve regurgitation. No evidence of mitral valve stenosis. Tricuspid Valve: The tricuspid valve is grossly normal. Tricuspid valve regurgitation is moderate to severe. No evidence of tricuspid stenosis. Aortic Valve: The aortic valve is tricuspid. There is mild calcification of the aortic valve. There is mild thickening of the aortic valve. Aortic valve regurgitation is not visualized. Mild to moderate aortic valve sclerosis/calcification is present, without any evidence of aortic stenosis. Pulmonic Valve: The pulmonic valve was grossly normal. Pulmonic valve regurgitation is not visualized. No evidence of pulmonic stenosis. Aorta: The aortic root and ascending aorta are structurally normal, with no evidence of dilitation. Venous: The inferior vena cava is normal in size with less than 50% respiratory variability, suggesting right atrial pressure of 8 mmHg. IAS/Shunts: The atrial septum is grossly normal.  LEFT VENTRICLE PLAX 2D LVIDd:         4.20 cm  Diastology LVIDs:         3.00 cm  LV e' medial:    8.27 cm/s LV PW:         0.80 cm  LV E/e' medial:  11.9 LV IVS:        0.90 cm  LV e' lateral:   11.00 cm/s LVOT diam:     2.10 cm  LV E/e' lateral: 9.0 LV SV:         72 LV SV Index:   42 LVOT Area:     3.46 cm  RIGHT VENTRICLE RV S prime:     12.90 cm/s TAPSE (M-mode): 2.1 cm LEFT ATRIUM              Index       RIGHT ATRIUM           Index LA diam:        4.90 cm  2.87 cm/m  RA Area:     19.50 cm LA Vol (A2C):    100.0 ml 58.47 ml/m RA Volume:   52.30 ml  30.58 ml/m LA Vol (A4C):   65.0 ml  38.01 ml/m LA Biplane Vol: 81.0 ml  47.36 ml/m  AORTIC VALVE LVOT Vmax:   96.90 cm/s LVOT Vmean:  67.700 cm/s LVOT VTI:    0.207 m  AORTA Ao Asc diam: 3.40 cm MITRAL VALVE  TRICUSPID VALVE MV Area (PHT): 4.21 cm    TR Peak grad:   48.4 mmHg MV Decel Time: 180 msec    TR Vmax:        348.00 cm/s MV E velocity: 98.80 cm/s MV A velocity: 54.80 cm/s  SHUNTS MV E/A ratio:  1.80        Systemic VTI:  0.21 m                            Systemic Diam: 2.10 cm Eleonore Chiquito MD Electronically signed by Eleonore Chiquito MD Signature Date/Time: 01/10/2020/10:40:54 AM    Final    CT HEAD CODE STROKE WO CONTRAST  Result Date: 01/09/2020 CLINICAL DATA:  Code stroke. Acute neuro deficit. Headache and vomiting. EXAM: CT HEAD WITHOUT CONTRAST TECHNIQUE: Contiguous axial images were obtained from the base of the skull through the vertex without intravenous contrast. COMPARISON:  05/31/2018 FINDINGS: Brain: Chronic microvascular ischemic change in the white matter. Chronic infarct in the right frontal parietal cortex. Negative for acute infarct, hemorrhage, mass Vascular: Atherosclerotic calcification in the carotid and vertebral arteries. Negative for hyperdense vessel Skull: Negative Sinuses/Orbits: Paranasal sinuses clear. Left cataract extraction. No orbital lesion. Other: None ASPECTS ([place] Stroke Program Early CT Score) - Ganglionic level infarction (caudate, lentiform nuclei, internal capsule, insula, M1-M3 cortex): 7 - Supraganglionic infarction (M4-M6 cortex): 3 Total score (0-10 with 10 being normal): 10 IMPRESSION: 1. No acute abnormality 2. ASPECTS is 10 3. Chronic infarct right frontal parietal cortex. Chronic microvascular ischemic change in the white matter. 4. Code stroke imaging results were communicated on 01/09/2020 at 1:01 pm to provider Dr. Leonel Ramsay via [facility] Electronically Signed   By: Franchot Gallo M.D.   On:  01/09/2020 13:02    DISCHARGE EXAMINATION: Vitals:   01/13/20 2339 01/14/20 0321 01/14/20 0900 01/14/20 1109  BP: 137/70 122/78 (!) 135/116 (!) 157/81  Pulse: 70 62 72 63  Resp: 18 16 19 20   Temp: 97.6 F (36.4 C) 97.9 F (36.6 C) 98.6 F (37 C) 97.6 F (36.4 C)  TempSrc: Oral Oral Oral Oral  SpO2: 96% 97% 100% 98%  Weight:      Height:       General appearance: Awake alert.  In no distress Resp: Clear to auscultation bilaterally.  Normal effort Cardio: S1-S2 is normal regular.  No S3-S4.  No rubs murmurs or bruit GI: Abdomen is soft.  Nontender nondistended.  Bowel sounds are present normal.  No masses organomegaly    DISPOSITION: CIR   Current Inpatient Medications:  Scheduled: . atorvastatin  80 mg Oral q1800  . dabigatran  150 mg Oral Q12H  . diltiazem  120 mg Oral Daily  . flecainide  50 mg Oral BID  . potassium chloride  40 mEq Oral Once   Continuous:  WYO:VZCHYIF carbonate, famotidine, meclizine, prochlorperazine, senna-docusate   TOTAL DISCHARGE TIME: 35 minutes  Sherilee Smotherman [employer] on [email or web address]  01/14/2020, 11:10 AM

## 2020-01-14 NOTE — Progress Notes (Signed)
Show:Clear all [x] Manual[x] Template[x] Copied  Added by: [x] Genella Mech, CCC-SLP  [] Hover for details PMR Admission Coordinator Pre-Admission Assessment   Patient: Mary Mercado is an 79 y.o., female MRN: [id number] DOB: March 19, 1941 Height: 5\' 5"  (165.1 cm) Weight: 64.7 kg                                                                                                                                                  Insurance Information HMO:     PPO:      PCP:      IPA:      80/20: yes     OTHER:  PRIMARY: Medicare Part A and B     Policy#: [id number]      Subscriber: Patient Medicare Part A and B   Policy#: [id number]      Subscriber: Pt.  Benefits:  Phone #: passport one online     Name: 9/18 Eff. Date: Part A and B effective 04/25/2004 Deduct: $1484      Out of Pocket Max: none      Life Max: none  CIR: 100%      SNF:  Outpatient: 80%     Co-Pay: 20% Home Health: 100%      Co-Pay: none DME: 80%     Co-Pay: 20% Providers: pt choice SECONDARY: BCBS Supplement      Policy#: [id number]        The "Data Collection Information Summary" for patients in Inpatient Rehabilitation Facilities with attached "Privacy Act [place] Records" was provided and verbally reviewed with: Patient   Emergency Contact Information         Contact Information     Name Relation Home Work Mobile    Mary Mercado Spouse [phone]   [phone]       Current Medical History  Patient Admitting Diagnosis: CVA History of Present Illness: Mary Mercado is a 79 y.o. right-handed female with history of CAD, PAF on Eliquis, hypertension, hyperlipidemia as well as vertigo.  Per chart review patient lives with spouse in a one level home.  Independent driving prior to admission.  Presented 01/09/2019 while left-sided weakness and slurred speech.  Cranial CT scan negative.  Patient did not receive TPA.  CT angiogram of head and neck negative for intracranial  large vessel occlusion.  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  No associated hemorrhage or mass-effect.  Echocardiogram with ejection fraction of 60 to 65% no wall motion abnormalities grade 2 diastolic dysfunction.  Admission chemistries alcohol negative, glucose 209, BUN 7, urine drug screen negative.  Neurology follow-up currently maintained on aspirin for CVA prophylaxis as well as subcutaneous Lovenox for DVT prophylaxis.  Awaiting plan to resume Eliquis versus change to Pradaxa. PT notes BPPV during therapy sessions.   Tolerating a regular diet.  Therapy  evaluations completed with recommendations of physical medicine rehab consult. Complete NIHSS TOTAL: 0 Glasgow Coma Scale Score: 15   Past Medical History      Past Medical History:  Diagnosis Date  . Allergy    . CVA (cerebral vascular accident) ([place])    . Hyperlipidemia LDL goal <70    . Hypertension    . Palpitations    . Vertigo, benign positional        Family History  family history includes Colon cancer in her father; Diabetes in her mother; Heart attack in her mother; Heart attack (age of onset: 47) in her brother.   Prior Rehab/Hospitalizations:  Has the patient had prior rehab or hospitalizations prior to admission? Yes   Has the patient had major surgery during 100 days prior to admission? No   Current Medications    Current Facility-Administered Medications:  .  atorvastatin (LIPITOR) tablet 80 mg, 80 mg, Oral, q1800, Bonnielee Haff, MD, 80 mg at 01/13/20 1806 .  calcium carbonate (TUMS - dosed in mg elemental calcium) chewable tablet 800 mg of elemental calcium, 800 mg of elemental calcium, Oral, Daily PRN, Bonnielee Haff, MD .  dabigatran (PRADAXA) capsule 150 mg, 150 mg, Oral, Q12H, Skeet Simmer, RPH, 150 mg at 01/13/20 2102 .  diltiazem (CARDIZEM CD) 24 hr capsule 120 mg, 120 mg, Oral, Daily, Bonnielee Haff, MD, 120 mg at 01/13/20 5361 .  famotidine (PEPCID) tablet  10 mg, 10 mg, Oral, Daily PRN, Bonnielee Haff, MD .  flecainide (TAMBOCOR) tablet 50 mg, 50 mg, Oral, BID, Bonnielee Haff, MD, 50 mg at 01/13/20 2102 .  meclizine (ANTIVERT) tablet 12.5 mg, 12.5 mg, Oral, TID PRN, Wynetta Fines T, MD, 12.5 mg at 01/11/20 1754 .  prochlorperazine (COMPAZINE) injection 10 mg, 10 mg, Intravenous, Q6H PRN, Shalhoub, Sherryll Burger, MD, 10 mg at 01/11/20 1611 .  senna-docusate (Senokot-S) tablet 1 tablet, 1 tablet, Oral, QHS PRN, Lequita Halt, MD   Facility-Administered Medications Ordered in Other Encounters:  .  regadenoson (LEXISCAN) injection SOLN 0.4 mg, 0.4 mg, Intravenous, Once, Buford Dresser, MD .  technetium tetrofosmin (TC-MYOVIEW) injection 32 millicurie, 32 millicurie, Intravenous, Once PRN, Buford Dresser, MD   Patients Current Diet:     Diet Order                      Diet regular Room service appropriate? Yes with Assist; Fluid consistency: Thin  Diet effective now                      Precautions / Restrictions Precautions Precautions: Fall Precaution Comments: dizzy with eyes open, struggling to sit up Restrictions Weight Bearing Restrictions: No Other Position/Activity Restrictions: poor tolerance for upright sitting    Has the patient had 2 or more falls or a fall with injury in the past year?No   Prior Activity Level Community (5-7x/wk): Active, going out 3-5x a week   Prior Functional Level Prior Function Level of Independence: Independent Comments: ADLs, IADLs, driving, enjoys singing and going ot basketball games   Self Care: Did the patient need help bathing, dressing, using the toilet or eating?  Independent   Indoor Mobility: Did the patient need assistance with walking from room to room (with or without device)? Independent   Stairs: Did the patient need assistance with internal or external stairs (with or without device)? Independent   Functional Cognition: Did the patient need help planning regular  tasks such as shopping or remembering to  take medications? Independent   Home Assistive Devices / Equipment Home Equipment: Environmental consultant - 2 wheels, Cane - single point, Civil engineer, contracting   Prior Device Use: Indicate devices/aids used by the patient prior to current illness, exacerbation or injury? None of the above   Current Functional Level Cognition   Arousal/Alertness: Awake/alert Overall Cognitive Status: Within Functional Limits for tasks assessed Orientation Level: Oriented X4 General Comments: Very pleasant. Slight flat affect and reporting nausea. Requiring increased time for processing. Will continue to follow acutely as admitted Attention: Focused, Sustained Focused Attention: Appears intact Sustained Attention: Appears intact Memory: Impaired Memory Impairment: Retrieval deficit, Decreased recall of new information (Immediate: 5/5; delayed: 4/5; with cues: 1/1) Awareness: Appears intact Problem Solving: Appears intact Executive Function: Reasoning, Sequencing, Organizing Reasoning: Appears intact Sequencing:  (Clock drawing: 4/4 with self- correction) Organizing: Impaired Organizing Impairment: Verbal complex (backward digit span: 1/3)    Extremity Assessment (includes Sensation/Coordination)   Upper Extremity Assessment: LUE deficits/detail LUE Deficits / Details: Decreased grasp strength compared to R. Decreased coorindation. LUE Sensation: decreased light touch LUE Coordination: decreased gross motor, decreased fine motor  Lower Extremity Assessment: Defer to PT evaluation LLE Deficits / Details: gross motor incoordination with control of hip knee and ankle LLE Sensation: decreased light touch LLE Coordination: decreased fine motor, decreased gross motor     ADLs   Overall ADL's : Needs assistance/impaired Eating/Feeding: Set up, Sitting Grooming: Set up, Sitting Upper Body Bathing: Min guard, Sitting Lower Body Bathing: Minimal assistance, Sit to/from stand Upper Body  Dressing : Min guard, Sitting Lower Body Dressing: Moderate assistance, Sit to/from stand Toilet Transfer: Minimal assistance, Ambulation (single hand held support) Toilet Transfer Details (indicate cue type and reason): Min A for balance in standing.  Functional mobility during ADLs: Minimal assistance (single hald held A) General ADL Comments: Pt presenting with decreased cognition, balance, and coorindation impacting her safe performance of ADLs. Providing education on focusing at one objects to decrease dizziness.      Mobility   Overal bed mobility: Needs Assistance Bed Mobility: Supine to Sit, Sit to Supine Sidelying to sit: Mod assist Supine to sit: Min guard, HOB elevated Sit to supine: Supervision Sit to sidelying: Mod assist General bed mobility comments: pt preferred come to long-sitting and "get acclimated" then progress legs over EOB to sitting; educated on visual fixation and turning head first to find target, then moving body     Transfers   Overall transfer level: Needs assistance Equipment used: 1 person hand held assist Transfers: Sit to/from Stand Sit to Stand: Min assist General transfer comment: Min A for gaining balance in standing.      Ambulation / Gait / Stairs / Wheelchair Mobility   Ambulation/Gait General Gait Details: unable to tolerate; however did walk to bathroom with RW with nursing earlier     Posture / Balance Dynamic Sitting Balance Sitting balance - Comments: increased control during sitting time to fair- Balance Overall balance assessment: Needs assistance Sitting-balance support: Feet supported Sitting balance-Leahy Scale: Fair Sitting balance - Comments: increased control during sitting time to fair- Postural control: Right lateral lean Standing balance support: Single extremity supported, During functional activity Standing balance-Leahy Scale: Poor Standing balance comment: fearful to attempt without UE support     Special needs/care  consideration Skin: ecchymosis BUEs         Previous Home Environment (from acute therapy documentation) Living Arrangements: Spouse/significant other  Lives With: Spouse Available Help at Discharge: Family, Available 24 hours/day Type of Home: House  Home Layout: One level Home Access: Level entry Bathroom Shower/Tub: Multimedia programmer: Standard Additional Comments: has not needed AD since last CVA after leaving CIR   Discharge Living Setting Plans for Discharge Living Setting: Patient's home Type of Home at Discharge: House Discharge Home Layout: One level Discharge Home Access: Level entry Discharge Bathroom Shower/Tub: Walk-in shower Discharge Bathroom Toilet: Standard Discharge [place] Accessibility: Yes How Accessible: Accessible via walker Does the patient have any problems obtaining your medications?: No   Social/Family/Support Systems Patient Roles: Spouse Contact Information: [phone] Anticipated Caregiver: Karren Burly Anticipated Caregiver's Contact Information: [phone] Ability/Limitations of Caregiver: none (Can provide min-mod A) Caregiver Availability: 24/7 Discharge Plan Discussed with Primary Caregiver: Yes Is Caregiver In Agreement with Plan?: Yes Does Caregiver/Family have Issues with Lodging/Transportation while Pt is in Rehab?: Yes     Goals Patient/Family Goal for Rehab: PT/OT mod I  Expected length of stay: 14-18 days Pt/Family Agrees to Admission and willing to participate: Yes Program Orientation Provided & Reviewed with Pt/Caregiver Including Roles  & Responsibilities: Yes     Decrease burden of Care through IP rehab admission: Specialzed equipment needs, Decrease number of caregivers and Patient/family education     Possible need for SNF placement upon discharge:not anticipated    Patient Condition: This patient's condition remains as documented in the consult dated 01/13/2020, in which the Rehabilitation Physician  determined and documented that the patient's condition is appropriate for intensive rehabilitative care in an inpatient rehabilitation facility. Will admit to inpatient rehab today.   Preadmission Screen Completed By:  Genella Mech, CCC-SLP, 01/14/2020 9:46 AM ______________________________________________________________________   Discussed status with Dr. Dagoberto Ligas on 01/14/20 at 9:30 AM and received approval for admission today.   Admission Coordinator:  Genella Mech, time 9:30 AM/Date 01/14/20

## 2020-01-14 NOTE — H&P (Signed)
Physical Medicine and Rehabilitation Admission H&P    CC: Stroke with functional deficits.    HPI: Mary Mercado is an 79 year old RH- female with history of CAD, R-CVA, PAF s/p DCCV recently, who was admitted on 01/09/20 with acute onset of dizziness, slurred speech, nausea and vertigo while at cardiologist office. CTA head/neck showed moderate to severe stenosis at L-VA and moderate stenosis right P2 segment. MRI brain showed acute 3 mm infarct in R-SCA and evolution of prior R-MCA infarcts.  2D echo showed EF 60-65% with biatrial dilatation, moderately elevated PAH, mild MVR and mild to moderate aortic valve sclerosis.  Stroke felt to be embolic due to A fib and Eliquis changed to pradaxa per Dr. Clydene Fake input. Her swallow function has improved and patient on regular diet. Patient with mild dysarthria with mild memory deficits, vestibular symptoms and balance deficits affecting activity tolerance as well as ADL tasks. CIR recommended due to funcitonal decline.   Poor sleep last night- but normally good sleep- LBM this AM; voiding well, now.   Review of Systems  Constitutional: Negative for chills and fever.  HENT: Tinnitus: for the past week.   Eyes: Negative for blurred vision and double vision.  Respiratory: Negative for cough, hemoptysis and sputum production.   Cardiovascular: Negative for chest pain, palpitations and leg swelling.  Gastrointestinal: Positive for heartburn. Negative for constipation, nausea and vomiting.  Genitourinary: Negative for dysuria and urgency.  Skin: Negative for rash.  Neurological: Positive for dizziness (with certain activity), sensory change (left sensory deficits from last stroke ), speech change and focal weakness (left hand still has motor weakness/decrease in fine motor control ).  Psychiatric/Behavioral: The patient does not have insomnia.   All other systems reviewed and are negative.    Past Medical History:  Diagnosis Date  . Allergy    . CVA (cerebral vascular accident) ([place])   . Hyperlipidemia LDL goal <70   . Hypertension   . Palpitations   . Vertigo, benign positional     Past Surgical History:  Procedure Laterality Date  . CARDIOVERSION Left 10/31/2018   Procedure: CARDIOVERSION;  Surgeon: Acie Fredrickson Wonda Cheng, MD;  Location: [facility];  Service: Cardiovascular;  Laterality: Left;  . CARDIOVERSION N/A 01/08/2020   Procedure: CARDIOVERSION;  Surgeon: [name], MD;  Location: [facility] ENDOSCOPY;  Service: Cardiovascular;  Laterality: N/A;  . Hemilaminectomy and microdiskectomy at L4-5 on the left.  12/10/2003  . TEE WITHOUT CARDIOVERSION N/A 06/05/2018   Procedure: TRANSESOPHAGEAL ECHOCARDIOGRAM (TEE);  Surgeon: Lelon Perla, MD;  Location: [facility] ENDOSCOPY;  Service: Cardiovascular;  Laterality: N/A;  loop    Family History  Problem Relation Age of Onset  . Colon cancer Father   . Diabetes Mother   . Heart attack Mother        35  . Heart attack Brother 64    Social History:  Married. Independent PTA. Used to work part time as a Forensic psychologist. She reports that she has never smoked. She has never used smokeless tobacco. She reports that she does not drink alcohol and does not use drugs.   Allergies  Allergen Reactions  . Duloxetine     Causes Bp elevation.  Increased body jerks, nausea  . Acetaminophen Hives and Rash    Medications Prior to Admission  Medication Sig Dispense Refill  . atorvastatin (LIPITOR) 40 MG tablet Take 1 tablet (40 mg total) by mouth daily at 6 PM. 90 tablet 0  . diltiazem (CARDIZEM  CD) 180 MG 24 hr capsule TAKE 1 CAPSULE BY MOUTH EVERY DAY (Patient taking differently: Take 180 mg by mouth daily. ) 90 capsule 3  . diltiazem (CARDIZEM) 30 MG tablet Take 30 mg by mouth 4 (four) times daily as needed (afib).     Marland Kitchen ELIQUIS 5 MG TABS tablet TAKE 1 TABLET BY MOUTH TWICE A DAY (Patient taking differently: Take 5 mg by mouth 2 (two) times daily. ) 60 tablet 10  . flecainide  (TAMBOCOR) 50 MG tablet Take 1 tablet (50 mg total) by mouth 2 (two) times daily. 180 tablet 3  . telmisartan (MICARDIS) 20 MG tablet TAKE 1 TABLET BY MOUTH EVERY DAY (Patient taking differently: Take 10 mg by mouth daily. ) 90 tablet 0    Drug Regimen Review  Drug regimen was reviewed and remains appropriate with no significant issues identified  Home: Home Living Family/patient expects to be discharged to:: Private residence Living Arrangements: Spouse/significant other   Functional History:    Functional Status:  Mobility:          ADL:    Cognition: Cognition Orientation Level: Oriented X4     Blood pressure (!) 148/74, pulse 67, temperature 97.8 F (36.6 C), resp. rate 17, height 5\' 5"  (1.651 m), weight 62.9 kg, SpO2 96 %. Physical Exam Vitals and nursing note reviewed.  Constitutional:      Comments: Sitting up in bed- appropriate, some word hesitancy; husband at bedside, NAD  HENT:     Head: Normocephalic and atraumatic.     Comments: Smile equal- tongue coated, but midline    Nose: Nose normal. No congestion.     Mouth/Throat:     Mouth: Mucous membranes are dry.     Pharynx: Oropharynx is clear.  Eyes:     Extraocular Movements: Extraocular movements intact.     Comments: No nystagmus  Cardiovascular:     Comments: Irregular rate; rate controlled- no JVD seen Pulmonary:     Comments: CTA B/L- no W/R/R- good air movement  Abdominal:     Comments: Soft, NT, ND, (+)BS -normoactive  Musculoskeletal:        General: No swelling.     Cervical back: Normal range of motion. No rigidity.     Comments: RUE/RLE 5/5- in biceps, triceps, WE, grip and finger abd as well as HF, KE, DF and PF LUE- 4+/5 in biceps and triceps, 5-/5 in WE, grip and finger abd LLE- 5-/5 in HF, KE, DF and PF   Skin:    General: Skin is warm and dry.     Comments: IV in L forearm- looks good No bogginess in heels  Neurological:     Mental Status: She is oriented to person, place,  and time.     Comments: Mild right facial weakness with halting speech. Able to follow simple commands without difficulty. Decrease in fine motor movements RUE.  Intact to light touch in all 4 extremities and face R sided rapid alternating movements significantly decreased in speed and accuracy R sided finger to nose- searching pattern  Psychiatric:     Comments: appropriate     Results for orders placed or performed during the hospital encounter of 01/09/20 (from the past 48 hour(s))  CBC     Status: None   Collection Time: 01/14/20  4:33 AM  Result Value Ref Range   WBC 7.3 4.0 - 10.5 K/uL   RBC 4.67 3.87 - 5.11 MIL/uL   Hemoglobin 14.2 12.0 - 15.0 g/dL  HCT 42.5 36 - 46 %   MCV 91.0 80.0 - 100.0 fL   MCH 30.4 26.0 - 34.0 pg   MCHC 33.4 30.0 - 36.0 g/dL   RDW 13.0 11.5 - 15.5 %   Platelets 233 150 - 400 K/uL   nRBC 0.0 0.0 - 0.2 %    Comment: Performed at [facility], [place] [address] [place], [place] [address]  Comprehensive metabolic panel     Status: Abnormal   Collection Time: 01/14/20  4:33 AM  Result Value Ref Range   Sodium 141 135 - 145 mmol/L   Potassium 3.1 (L) 3.5 - 5.1 mmol/L   Chloride 103 98 - 111 mmol/L   CO2 27 22 - 32 mmol/L   Glucose, Bld 110 (H) 70 - 99 mg/dL    Comment: Glucose reference range applies only to samples taken after fasting for at least 8 hours.   BUN 8 8 - 23 mg/dL   Creatinine, Ser 0.65 0.44 - 1.00 mg/dL   Calcium 9.0 8.9 - 10.3 mg/dL   Total Protein 6.0 (L) 6.5 - 8.1 g/dL   Albumin 3.3 (L) 3.5 - 5.0 g/dL   AST 14 (L) 15 - 41 U/L   ALT 16 0 - 44 U/L   Alkaline Phosphatase 74 38 - 126 U/L   Total Bilirubin 1.3 (H) 0.3 - 1.2 mg/dL   GFR calc non Af Amer >60 >60 mL/min   GFR calc Af Amer >60 >60 mL/min   Anion gap 11 5 - 15    Comment: Performed at [facility] [address], [place], [place] [address]  Magnesium     Status: None   Collection Time: 01/14/20  4:33 AM  Result Value Ref Range   Magnesium 1.8 1.7 - 2.4  mg/dL    Comment: Performed at [facility] [address], [place], [place] [address]   No results found.     Medical Problem List and Plan: 1.  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  Antithrombotics: -DVT/anticoagulation:  Pharmaceutical: Pradexa--family aware and agreeable of $118.80/- copay. (changed from Eliquis)  -antiplatelet therapy: N/A 3. Dysesthesias/Pain Management: Off Cymbalta due to SE.  4. Mood: LCSW to follow for evaluation and support.   -antipsychotic agents: N/A 5. Neuropsych: This patient is capable of making decisions on her own behalf. 6. Skin/Wound Care: Routine pressure relief measures.  7. Fluids/Electrolytes/Nutrition: Monitor I/O. Check lytes in am.  8. PAF: Monitor HR tid--on Flecainide and diltiazem for rate control. .  9. Hypokalemia: Will add additional supplement X 2 due to history of arrthymia 10.  Pre-diabetes: Hgb A1c- 5.9. Add CM restrictions to diet. Will have RD educate on HH/CM diet. 11. Dyslipidemia: Lipitor was increased to 80 mg/day 12. HTN- on diltiazem; Micardis being held.  13. Initial dysphagia and urinary retention- resolved.   Bary Leriche, PA-C 01/14/2020   I have personally performed a face to face diagnostic evaluation of this patient and formulated the key components of the plan.  Additionally, I have personally reviewed laboratory data, imaging studies, as well as relevant notes and concur with the physician assistant's documentation above.     Courtney Heys, MD 01/14/2020

## 2020-01-14 NOTE — Discharge Instructions (Signed)

## 2020-01-14 NOTE — Progress Notes (Signed)
Inpatient Rehabilitation  Patient information reviewed and entered into eRehab system by Lindey Renzulli M. Lameka Disla, M.A., CCC/SLP, PPS Coordinator.  Information including medical coding, functional ability and quality indicators will be reviewed and updated through discharge.    

## 2020-01-14 NOTE — Consult Note (Signed)
   [facility] Inpatient Consult   01/14/2020  Mary Mercado [birth year] [id number]  [place] Organization [ACO] Patient: Medicare   Patient screened for transition of care review for potential [facility] service needs.  Review of patient's medical record reveals patient is being transitioned to inpatient rehab at [facility] as current disposition.  Primary Care Provider is [name], MD this provider is listed to provide the transition of care [TOC] for post hospital follow up.  Plan:  Please place a [facility] Care Management consult as appropriate for post rehab transition of care needs and for questions contact:   Natividad Brood, RN BSN [facility] Liaison  [phone] business mobile phone Toll free office [phone]  Fax number: [phone] [email or web address]

## 2020-01-14 NOTE — TOC Transition Note (Signed)
Transition of Care [facility]) - CM/SW Discharge Note   Patient Details  Name: Mary Mercado MRN: [id number] Date of Birth: Sep 11, 1940  Transition of Care [facility]) CM/SW Contact:  Pollie Friar, RN Phone Number: 01/14/2020, 2:03 PM   Clinical Narrative:    Pt discharging to CIR today. CM signing off.   Final next level of care: IP Rehab Facility Barriers to Discharge: No Barriers Identified   Patient Goals and CMS Choice        Discharge Placement                       Discharge Plan and Services                                     Social Determinants of Health (SDOH) Interventions     Readmission Risk Interventions No flowsheet data found.

## 2020-01-15 ENCOUNTER — Inpatient Hospital Stay (HOSPITAL_COMMUNITY): Payer: Medicare Other

## 2020-01-15 ENCOUNTER — Inpatient Hospital Stay (HOSPITAL_COMMUNITY): Payer: Medicare Other | Admitting: Physical Therapy

## 2020-01-15 ENCOUNTER — Inpatient Hospital Stay (HOSPITAL_COMMUNITY): Payer: Medicare Other | Admitting: Occupational Therapy

## 2020-01-15 LAB — CBC WITH DIFFERENTIAL/PLATELET
Abs Immature Granulocytes: 0.01 10*3/uL (ref 0.00–0.07)
Basophils Absolute: 0 10*3/uL (ref 0.0–0.1)
Basophils Relative: 1 %
Eosinophils Absolute: 0.2 10*3/uL (ref 0.0–0.5)
Eosinophils Relative: 2 %
HCT: 46.3 % — ABNORMAL HIGH (ref 36.0–46.0)
Hemoglobin: 15.1 g/dL — ABNORMAL HIGH (ref 12.0–15.0)
Immature Granulocytes: 0 %
Lymphocytes Relative: 24 %
Lymphs Abs: 1.8 10*3/uL (ref 0.7–4.0)
MCH: 29.5 pg (ref 26.0–34.0)
MCHC: 32.6 g/dL (ref 30.0–36.0)
MCV: 90.6 fL (ref 80.0–100.0)
Monocytes Absolute: 0.8 10*3/uL (ref 0.1–1.0)
Monocytes Relative: 11 %
Neutro Abs: 4.5 10*3/uL (ref 1.7–7.7)
Neutrophils Relative %: 62 %
Platelets: 242 10*3/uL (ref 150–400)
RBC: 5.11 MIL/uL (ref 3.87–5.11)
RDW: 12.7 % (ref 11.5–15.5)
WBC: 7.3 10*3/uL (ref 4.0–10.5)
nRBC: 0 % (ref 0.0–0.2)

## 2020-01-15 LAB — COMPREHENSIVE METABOLIC PANEL
ALT: 20 U/L (ref 0–44)
AST: 19 U/L (ref 15–41)
Albumin: 3.7 g/dL (ref 3.5–5.0)
Alkaline Phosphatase: 83 U/L (ref 38–126)
Anion gap: 12 (ref 5–15)
BUN: 10 mg/dL (ref 8–23)
CO2: 27 mmol/L (ref 22–32)
Calcium: 9.3 mg/dL (ref 8.9–10.3)
Chloride: 99 mmol/L (ref 98–111)
Creatinine, Ser: 0.62 mg/dL (ref 0.44–1.00)
GFR calc Af Amer: >60 mL/min (ref >60)
GFR calc non Af Amer: >60 mL/min (ref >60)
Glucose, Bld: 123 mg/dL — ABNORMAL HIGH (ref 70–99)
Potassium: 3.4 mmol/L — ABNORMAL LOW (ref 3.5–5.1)
Sodium: 138 mmol/L (ref 135–145)
Total Bilirubin: 1.7 mg/dL — ABNORMAL HIGH (ref 0.3–1.2)
Total Protein: 6.9 g/dL (ref 6.5–8.1)

## 2020-01-15 MED ORDER — OFF THE BEAT BOOK
Freq: Once | Status: AC
  Filled 2020-01-15: qty 1

## 2020-01-15 MED ORDER — BLOOD PRESSURE CONTROL BOOK
Freq: Once | Status: AC
  Filled 2020-01-15: qty 1

## 2020-01-15 NOTE — Progress Notes (Signed)
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 Allergy   . CVA (cerebral vascular accident) ([place])   . Hyperlipidemia LDL goal <70   . Hypertension   . Palpitations   . Vertigo, benign positional    Past Surgical History:  Past Surgical History:  Procedure Laterality Date  . CARDIOVERSION Left 10/31/2018   Procedure: CARDIOVERSION;  Surgeon: Acie Fredrickson Wonda Cheng, MD;  Location: [place];  Service: Cardiovascular;  Laterality: Left;  . CARDIOVERSION N/A 01/08/2020   Procedure: CARDIOVERSION;  Surgeon: [name], MD;  Location: [facility] ENDOSCOPY;  Service: Cardiovascular;  Laterality: N/A;  . Hemilaminectomy and microdiskectomy at L4-5 on the left.  12/10/2003  . TEE WITHOUT CARDIOVERSION N/A 06/05/2018   Procedure: TRANSESOPHAGEAL ECHOCARDIOGRAM (TEE);  Surgeon: Lelon Perla, MD;  Location: [facility] ENDOSCOPY;  Service: Cardiovascular;  Laterality: N/A;  loop    Assessment & Plan Clinical Impression: 79 year old RH- female with history of CAD, R-CVA, PAF s/p DCCV recently, who was admitted on 01/09/20 with acute onset of dizziness, slurred speech, nausea and vertigo while at cardiologist office. CTA head/neck showed moderate to severe stenosis at L-VA and moderate stenosis right P2 segment. MRI brain showed acute 3 mm infarct in R-SCA and evolution of prior R-MCA infarcts.  2D echo showed EF 60-65% with biatrial dilatation, moderately elevated PAH, mild MVR and mild to moderate aortic valve sclerosis.  Stroke felt to be  embolic due to A fib and Eliquis changed to pradaxa per Dr. Clydene Fake input. Her swallow function has improved and patient on regular diet. Patient with mild dysarthria with mild memory deficits, vestibular symptoms and balance deficits affecting activity tolerance as well as ADL tasks. CIR recommended due to funcitonal decline. Patient transferred to CIR on 01/14/2020 .    Patient currently requires min with basic self-care skills secondary to muscle weakness, impaired timing and sequencing, unbalanced muscle activation and decreased coordination and decreased visual motor skills.  Prior to hospitalization, patient could complete BADLs/IADLs with modified independence.  Patient will benefit from skilled intervention to decrease level of assist with basic self-care skills and increase independence with basic self-care skills prior to discharge home with care partner.  Anticipate patient will require intermittent supervision and follow up home health.  OT - End of Session Activity Tolerance: Improving Endurance Deficit: Yes OT Assessment Rehab Potential (ACUTE ONLY): Good OT Patient demonstrates impairments in the following area(s): Balance;Endurance;Motor;Safety;Vision OT Basic ADL's Functional Problem(s): Grooming;Bathing;Dressing;Toileting OT Transfers Functional Problem(s): Toilet;Tub/Shower OT Plan OT Intensity: Minimum of 1-2 x/day, 45 to 90 minutes OT Frequency: 5 out of 7 days OT Duration/Estimated Length of Stay: 7-10 days OT Treatment/Interventions: Balance/vestibular training;Community reintegration;Discharge planning;DME/adaptive equipment instruction;Functional mobility training;Self Care/advanced ADL retraining;Therapeutic Activities;Therapeutic Exercise;UE/LE Strength taining/ROM;UE/LE Coordination activities;Visual/perceptual remediation/compensation OT Self Feeding Anticipated Outcome(s): Independent OT Basic Self-Care Anticipated Outcome(s): Mod I OT Toileting Anticipated  Outcome(s): Mod I OT Bathroom Transfers Anticipated Outcome(s): Supervision OT Recommendation Patient destination: Home Follow Up Recommendations: Home health OT;24 hour supervision/assistance Equipment Recommended: To be determined   OT Evaluation Precautions/Restrictions  Precautions Precautions: Fall Restrictions Weight  Bearing Restrictions: No General Chart Reviewed: Yes Family/Caregiver Present: Yes (Pts husband [name]) Vital Signs Therapy Vitals Temp: 98.3 F (36.8 C) Pulse Rate: 63 Resp: 17 BP: 123/68 Patient Position (if appropriate): Lying Oxygen Therapy SpO2: 98 % O2 Device: Room Air Pain Pain Assessment Pain Scale: 0-10 Pain Score: 0-No pain Home Living/Prior Functioning Home Living Family/patient expects to be discharged to:: Private residence Living Arrangements: Spouse/significant other Available Help at Discharge: Family, Available 24 hours/day Type of Home: House Home Access: Level entry Home Layout: One level Bathroom Shower/Tub: Multimedia programmer: Standard Additional Comments: has not needed AD since last CVA after leaving CIR  Lives With: Spouse IADL History Homemaking Responsibilities: Yes (Shared responsibility with husband) Current License: Yes Mode of Transportation:  (SUV) Occupation: Retired Prior Function Level of Independence: Independent with basic ADLs, Independent with homemaking with ambulation  Able to Take Stairs?: Yes Driving: Yes Vocation: Retired Comments: driving, able to do self care and manage house Vision Baseline Vision/History: No visual deficits Patient Visual Report: No change from baseline Vision Assessment?: Yes Eye Alignment: Within Functional Limits Ocular Range of Motion: Within Functional Limits Alignment/Gaze Preference: Within Defined Limits Tracking/Visual Pursuits: Decreased smoothness of eye movement to RIGHT inferior field;Decreased smoothness of horizontal tracking Saccades: Impaired -  to be further tested in functional context Perception  Perception: Within Functional Limits Praxis Praxis: Intact Cognition Overall Cognitive Status: Within Functional Limits for tasks assessed Arousal/Alertness: Awake/alert Orientation Level: Person;Place;Situation Person: Oriented Place: Oriented Situation: Oriented Year: 2021 Month: September Day of Week: Correct Memory: Impaired Memory Impairment: Retrieval deficit;Decreased recall of new information Immediate Memory Recall: Sock;Blue;Bed Memory Recall Sock: Not able to recall Memory Recall Blue: Without Cue Memory Recall Bed: Without Cue Attention: Focused;Sustained Focused Attention: Appears intact Sustained Attention: Appears intact Awareness: Appears intact Problem Solving: Appears intact Safety/Judgment: Appears intact Sensation Sensation Light Touch: Appears Intact Hot/Cold: Appears Intact Proprioception: Appears Intact Coordination Gross Motor Movements are Fluid and Coordinated: No Fine Motor Movements are Fluid and Coordinated: No Coordination and Movement Description: mild Ataxia on the R Finger Nose Finger Test: dysmetric on the R Motor  Motor Motor: Ataxia;Hemiplegia Motor - Skilled Clinical Observations: mild R sided hemiplegia and ataxia  Trunk/Postural Assessment  Cervical Assessment Cervical Assessment: Within Functional Limits Thoracic Assessment Thoracic Assessment: Within Functional Limits Lumbar Assessment Lumbar Assessment: Within Functional Limits Postural Control Postural Control: Within Functional Limits  Balance Balance Balance Assessed: Yes Static Sitting Balance Static Sitting - Balance Support: No upper extremity supported Static Sitting - Level of Assistance: 6: Modified independent (Device/Increase time) Dynamic Sitting Balance Dynamic Sitting - Balance Support: No upper extremity supported Dynamic Sitting - Level of Assistance: 5: Stand by assistance Static Standing  Balance Static Standing - Balance Support: Bilateral upper extremity supported Static Standing - Level of Assistance: 5: Stand by assistance Dynamic Standing Balance Dynamic Standing - Balance Support: No upper extremity supported Dynamic Standing - Level of Assistance: 4: Min assist Extremity/Trunk Assessment RUE Assessment RUE Assessment: Within Functional Limits LUE Assessment LUE Assessment: Exceptions to [facility] Passive Range of Motion (PROM) Comments: WFL Active Range of Motion (AROM) Comments: WFL General Strength Comments: Residual LUE weakness from previous CVA 4-/5.  Care Tool Care Tool Self Care Eating   Eating Assist Level: Independent    Oral Care    Oral Care Assist Level: Set up assist    Bathing   Body parts bathed by patient: Right arm;Face;Left arm;Chest;Abdomen;Front perineal area;Right upper leg;Left upper leg;Right lower leg;Left lower leg Body parts bathed by  helper: Buttocks   Assist Level: Minimal Assistance - Patient > 75%    Upper Body Dressing(including orthotics)   What is the patient wearing?: Pull over shirt   Assist Level: Supervision/Verbal cueing    Lower Body Dressing (excluding footwear)   What is the patient wearing?: Underwear/pull up;Pants Assist for lower body dressing: Contact Guard/Touching assist    Putting on/Taking off footwear   What is the patient wearing?: Socks;Shoes Assist for footwear: Set up assist       Care Tool Toileting Toileting activity         Care Tool Bed Mobility Roll left and right activity   Roll left and right assist level: Supervision/Verbal cueing    Sit to lying activity   Sit to lying assist level: Supervision/Verbal cueing    Lying to sitting edge of bed activity   Lying to sitting edge of bed assist level: Supervision/Verbal cueing     Care Tool Transfers Sit to stand transfer   Sit to stand assist level: Minimal Assistance - Patient > 75%    Chair/bed transfer   Chair/bed transfer assist  level: Minimal Assistance - Patient > 75%     Toilet transfer   Assist Level: Minimal Assistance - Patient > 75%     Care Tool Cognition Expression of Ideas and Wants Expression of Ideas and Wants: Without difficulty (complex and basic) - expresses complex messages without difficulty and with speech that is clear and easy to understand   Understanding Verbal and Non-Verbal Content Understanding Verbal and Non-Verbal Content: Understands (complex and basic) - clear comprehension without cues or repetitions   Memory/Recall Ability *first 3 days only Memory/Recall Ability *first 3 days only: Current season;Location of own room;That he or she is in a hospital/hospital unit    Refer to Care Plan for [place] 1 OT Short Term Goal 1 (Week 1): STG=LTG 2/2 ELOS  Recommendations for other services: Other: TBD   Skilled Therapeutic Intervention Patient met lying supine in bed in agreement with OT assessment. Discussed process of rehab, established POC, and answered all questions to patient's satisfaction. Functional mobility to walk-in shower and bathing task in sitting/standing with use of shower bench. Please refer below for further detail. Patient completed UB/LB dressing in sitting/standing and 3/3 grooming tasks seated at sink level. Use of RW for functional mobility. Patient would benefit from continued skilled OT services to maximize safety and independence with self-care tasks, decrease risk of falls, and decrease caregiver burden.   ADL ADL Eating: Set up Where Assessed-Eating: Chair Grooming: Setup Where Assessed-Grooming: Sitting at sink Upper Body Bathing: Supervision/safety Where Assessed-Upper Body Bathing: Shower Lower Body Bathing: Minimal assistance Where Assessed-Lower Body Bathing: Shower Upper Body Dressing: Setup Where Assessed-Upper Body Dressing: Chair Lower Body Dressing: Minimal assistance Where Assessed-Lower Body Dressing:  Chair Toileting: Minimal assistance Where Assessed-Toileting: Glass blower/designer: Therapist, music Method: Associate Professor Method: Stand pivot Mobility  Bed Mobility Bed Mobility: Rolling Right;Rolling Left;Sit to Supine;Supine to Sit Rolling Right: Supervision/verbal cueing Rolling Left: Supervision/Verbal cueing Supine to Sit: Supervision/Verbal cueing Sit to Supine: Supervision/Verbal cueing Transfers Sit to Stand: Minimal Assistance - Patient > 75%   Discharge Criteria: Patient will be discharged from OT if patient refuses treatment 3 consecutive times without medical reason, if treatment goals not met, if there is a change in medical status, if patient makes no progress towards goals or if patient is discharged from  hospital.  The above assessment, treatment plan, treatment alternatives and goals were discussed and mutually agreed upon: by patient  Tattianna Schnarr R [place] 01/15/2020, 3:24 PM

## 2020-01-15 NOTE — Evaluation (Signed)
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 Allergy   . CVA (cerebral vascular accident) ([place])   . Hyperlipidemia LDL goal <70   . Hypertension   . Palpitations   . Vertigo, benign positional    Past Surgical History:  Past Surgical History:  Procedure Laterality Date  . CARDIOVERSION Left 10/31/2018   Procedure: CARDIOVERSION;  Surgeon: Acie Fredrickson Wonda Cheng, MD;  Location: [place];  Service: Cardiovascular;  Laterality: Left;  . CARDIOVERSION N/A 01/08/2020   Procedure: CARDIOVERSION;  Surgeon: [name], MD;  Location: [facility] ENDOSCOPY;  Service: Cardiovascular;  Laterality: N/A;  . Hemilaminectomy and microdiskectomy at L4-5 on the left.  12/10/2003  . TEE WITHOUT CARDIOVERSION N/A 06/05/2018   Procedure: TRANSESOPHAGEAL ECHOCARDIOGRAM (TEE);  Surgeon: Lelon Perla, MD;  Location: [facility] ENDOSCOPY;  Service: Cardiovascular;  Laterality: N/A;  loop    Assessment / Plan / Recommendation Clinical Impression   Patient is a51 year old RH- female with history of CAD, R-CVA, PAF s/p DCCV recently, who was admitted on 01/09/20 with acute onset of dizziness, slurred speech, nausea and vertigo while at cardiologist office. CTA head/neck showed moderate to severe stenosis at L-VA and moderate stenosis right P2 segment. MRI brain showed acute 3 mm infarct in R-SCA and evolution of prior R-MCA infarcts. 2D echo showed EF 60-65% with biatrial dilatation, moderately elevated PAH, mild MVR and mild to moderate aortic valve sclerosis. Stroke felt to be embolic due to A fib and  Eliquis changed to pradaxa per Dr. Clydene Fake input. Her swallow function has improved and patient on regular diet. Patient with mild dysarthria with mild memory deficits, vestibular symptoms and balance deficits affecting activity tolerance as well as ADL tasks.  Patient transferred to CIR on 01/14/2020 .  Pt presents with mild dysarthria at conversation level, impacted my misarticulations and fast rate. Pt demonstrated 70% speech intelligibility at conversation level, with a self-rated score of 40% reflecting baseline speech abilities. Pt reported difficulty swallowing solids, upon further investigation appears to be a globus sensation. SLP provided education pertaining to ways to reduce possible GERD, including separating solids/liquids, consuming small bites and remaining upright. Oral motor exam was unremarkable. Pt consumed trials of regular textures and thin liquids, demonstrating appropriate mastication, oral clearance, swallow appeared timely and no overt s/s aspiration noted. Pt reported no issues during PO trials. SLP administer formal cognitive linguistic assessment, Cognistat to further investigate higher level skills, since pt managed medication/money/time with husband prior to admission. Pt demonstrated WFL on all subsections, except for mild impairment in delayed recall and confrontation naming, in which pt supported is baseline. SLP recommends targeting dysarthria during CIR stay and can continue with follow up OP ST services if desired.    Skilled Therapeutic Interventions          Skilled ST services focused on speech skills. SLP instructed pt in speech intelligibility strategies to slow rate and pause between words to increase articulation accuracy. Pt's speech intelligibility in conversation improved to 80% accuracy with min A fade to supervision A verbal cues. Pt was left in room with bed alarm  set. SLP recommends to continue skilled ST services.    SLP Assessment  Patient will need skilled  Speech Lanaguage Pathology Services during CIR admission    Recommendations  SLP Diet Recommendations: Thin;Age appropriate regular solids Medication Administration: Whole meds with liquid Supervision: Patient able to self feed Postural Changes and/or Swallow Maneuvers: Seated upright 90 degrees Oral Care Recommendations: Oral care BID Patient destination: Home Follow up Recommendations: None;Outpatient SLP (can continue ST services if desired to achieve greater intelligibility accuarcy) Equipment Recommended: None recommended by SLP    SLP Frequency 3 to 5 out of 7 days   SLP Duration  SLP Intensity  SLP Treatment/Interventions 7-10 days  Minumum of 1-2 x/day, 30 to 90 minutes  Cueing hierarchy;Internal/external aids;Speech/Language facilitation    Pain Pain Assessment Pain Scale: 0-10 Pain Score: 0-No pain  Prior Functioning Cognitive/Linguistic Baseline: Baseline deficits Baseline deficit details: mild memory and word finding - per pt from CVA 2020 Type of Home: House  Lives With: Spouse Available Help at Discharge: Family;Available 24 hours/day Education: High school  Vocation: Retired  Programmer, systems Overall Cognitive Status: Within Functional Limits for tasks assessed Arousal/Alertness: Awake/alert Attention: Selective Focused Attention: Appears intact Sustained Attention: Appears intact Selective Attention: Appears intact Memory: Impaired Memory Impairment: Retrieval deficit;Decreased recall of new information (pt supports baseline) Immediate Memory Recall: Sock;Blue;Bed Memory Recall Sock: Not able to recall Memory Recall Blue: Without Cue Memory Recall Bed: Without Cue Awareness: Appears intact Problem Solving: Appears intact Safety/Judgment: Appears intact  Comprehension Auditory Comprehension Overall Auditory Comprehension: Appears within functional limits for tasks assessed Yes/No Questions: Within Functional Limits Commands: Within  Functional Limits Conversation: Complex Visual Recognition/Discrimination Discrimination: Within Function Limits Expression Expression Primary Mode of Expression: Verbal Verbal Expression Overall Verbal Expression: Impaired Initiation: No impairment Level of Generative/Spontaneous Verbalization: Conversation Repetition: No impairment Naming: Impairment Confrontation: Impaired (mild on cognistat) Pragmatics: No impairment Written Expression Dominant Hand: Right Oral Motor Oral Motor/Sensory Function Overall Oral Motor/Sensory Function: Within functional limits Motor Speech Overall Motor Speech: Impaired Respiration: Within functional limits Phonation: Normal Resonance: Within functional limits Articulation: Impaired Level of Impairment: Conversation Intelligibility: Intelligibility reduced Word: 75-100% accurate Phrase: 75-100% accurate Sentence: 75-100% accurate Conversation: 75-100% accurate Motor Planning: Witnin functional limits Motor Speech Errors: Aware;Consistent Effective Techniques: Slow rate;Pause  Care Tool Care Tool Cognition Expression of Ideas and Wants Expression of Ideas and Wants: Some difficulty - exhibits some difficulty with expressing needs and ideas (e.g, some words or finishing thoughts) or speech is not clear   Understanding Verbal and Non-Verbal Content Understanding Verbal and Non-Verbal Content: Understands (complex and basic) - clear comprehension without cues or repetitions   Memory/Recall Ability *first 3 days only Memory/Recall Ability *first 3 days only: Current season;Location of own room;That he or she is in a hospital/hospital unit     Intelligibility: Intelligibility reduced Word: 75-100% accurate Phrase: 75-100% accurate Sentence: 75-100% accurate Conversation: 75-100% accurate  Bedside Swallowing Assessment General Previous Swallow Assessment: no Diet Prior to this Study: Regular;Thin liquids History of Recent Intubation:  No Behavior/Cognition: Alert;Pleasant mood;Cooperative Oral Cavity - Dentition: Adequate natural dentition Self-Feeding Abilities: Able to feed self Vision: Functional for self-feeding Patient Positioning: Upright in bed Baseline Vocal Quality: Normal Volitional Cough: Strong Volitional Swallow: Able to elicit  Oral Care Assessment Does patient have any of the following "high(er) risk" factors?: None of the above Does patient have any of the following "at risk" factors?: None of the above Patient is LOW RISK: Follow universal precautions (see row information) Ice Chips Ice  chips: Not tested Thin Liquid Thin Liquid: Within functional limits Presentation: Cup;Straw Nectar Thick Nectar Thick Liquid: Not tested Honey Thick Honey Thick Liquid: Not tested Puree Puree: Not tested Solid Solid: Within functional limits Presentation: Self Fed BSE Assessment Risk for Aspiration Impact on safety and function: No limitations  Short Term Goals: Week 1: SLP Short Term Goal 1 (Week 1): STG=LTG due to short ELOS  Refer to Care Plan for Long Term Goals  Recommendations for other services: None   Discharge Criteria: Patient will be discharged from SLP if patient refuses treatment 3 consecutive times without medical reason, if treatment goals not met, if there is a change in medical status, if patient makes no progress towards goals or if patient is discharged from hospital.  The above assessment, treatment plan, treatment alternatives and goals were discussed and mutually agreed upon: by patient  [place]  [facility] 01/15/2020, 4:57 PM

## 2020-01-15 NOTE — Plan of Care (Signed)
  Problem: RH Balance Goal: LTG Patient will maintain dynamic standing balance (PT) Description: LTG:  Patient will maintain dynamic standing balance with assistance during mobility activities (PT) Flowsheets (Taken 01/15/2020 1332) LTG: Pt will maintain dynamic standing balance during mobility activities with:: Independent with assistive device    Problem: RH Bed Mobility Goal: LTG Patient will perform bed mobility with assist (PT) Description: LTG: Patient will perform bed mobility with assistance, with/without cues (PT). Flowsheets (Taken 01/15/2020 1332) LTG: Pt will perform bed mobility with assistance level of: Independent with assistive device    Problem: RH Bed to Chair Transfers Goal: LTG Patient will perform bed/chair transfers w/assist (PT) Description: LTG: Patient will perform bed to chair transfers with assistance (PT). Flowsheets (Taken 01/15/2020 1332) LTG: Pt will perform Bed to Chair Transfers with assistance level: Independent with assistive device    Problem: RH Car Transfers Goal: LTG Patient will perform car transfers with assist (PT) Description: LTG: Patient will perform car transfers with assistance (PT). Flowsheets (Taken 01/15/2020 1332) LTG: Pt will perform car transfers with assist:: Supervision/Verbal cueing   Problem: RH Furniture Transfers Goal: LTG Patient will perform furniture transfers w/assist (OT/PT) Description: LTG: Patient will perform furniture transfers  with assistance (OT/PT). Flowsheets (Taken 01/15/2020 1332) LTG: Pt will perform furniture transfers with assist:: Independent with assistive device    Problem: RH Ambulation Goal: LTG Patient will ambulate in controlled environment (PT) Description: LTG: Patient will ambulate in a controlled environment, # of feet with assistance (PT). Flowsheets (Taken 01/15/2020 1332) LTG: Pt will ambulate in controlled environ  assist needed:: Supervision/Verbal cueing LTG: Ambulation distance in controlled  environment: 153ft with LRAD Goal: LTG Patient will ambulate in home environment (PT) Description: LTG: Patient will ambulate in home environment, # of feet with assistance (PT). Flowsheets (Taken 01/15/2020 1332) LTG: Pt will ambulate in home environ  assist needed:: Supervision/Verbal cueing LTG: Ambulation distance in home environment: 79ft with LRAD   Problem: RH Stairs Goal: LTG Patient will ambulate up and down stairs w/assist (PT) Description: LTG: Patient will ambulate up and down # of stairs with assistance (PT) Flowsheets (Taken 01/15/2020 1332) LTG: Pt will ambulate up/down stairs assist needed:: Supervision/Verbal cueing LTG: Pt will  ambulate up and down number of stairs: 4 steps with bil rails

## 2020-01-15 NOTE — Progress Notes (Signed)
[facility] Individual Statement of Services  Patient Name:  Mary Mercado  Date:  01/15/2020  Welcome to the [facility].  Our goal is to provide you with an individualized program based on your diagnosis and situation, designed to meet your specific needs.  With this comprehensive rehabilitation program, you will be expected to participate in at least 3 hours of rehabilitation therapies Monday-Friday, with modified therapy programming on the weekends.  Your rehabilitation program will include the following services:  Physical Therapy (PT), Occupational Therapy (OT), Speech Therapy (ST), 24 hour per day rehabilitation nursing, Therapeutic Recreaction (TR), Neuropsychology, Care Coordinator, Rehabilitation Medicine, Nutrition Services, Pharmacy Services and Other  Weekly team conferences will be held on Wednesday to discuss your progress.  Your Inpatient Rehabilitation Care Coordinator will talk with you frequently to get your input and to update you on team discussions.  Team conferences with you and your family in attendance may also be held.  Expected length of stay: 10-14 Days   Overall anticipated outcome: MOD I   Depending on your progress and recovery, your program may change. Your Inpatient Rehabilitation Care Coordinator will coordinate services and will keep you informed of any changes. Your Inpatient Rehabilitation Care Coordinator's name and contact numbers are listed  below.  The following services may also be recommended but are not provided by the [facility]:    [place] will be made to provide these services after discharge if needed.  Arrangements include referral to agencies that provide these services.  Your insurance has been verified to be:  Medicare  Your primary doctor is:  [name], MD  Pertinent information  will be shared with your doctor and your insurance company.  Inpatient Rehabilitation Care Coordinator:  Erlene Quan, [place] or [phone]  Information discussed with and copy given to patient by: Dyanne Iha, 01/15/2020, 11:58 AM

## 2020-01-15 NOTE — Progress Notes (Signed)
Physical Therapy Session Note  Patient Details  Name: Mary Mercado MRN: [id number] Date of Birth: Apr 29, 1940  Today's Date: 01/15/2020 PT Individual Time: 1450-1520 PT Individual Time Calculation (min): 30 min   Short Term Goals: Week 1:  PT Short Term Goal 1 (Week 1): STG=LTG due ELOS  Skilled Therapeutic Interventions/Progress Updates:   Pt received supine in bed and agreeable to PT. Supine>sit transfer with supervsion assist for safety. Stand pivot transfer to [facility] with CGA and no AD. Pt transported to rehab gym in [facility]. Gait training with RW x 145f with supervision -CGA from PT with min cues for gaze stabilization as well as AD management in turns to prevent lateral LOB. Pt then performed dynamic gait training to weave through 6 cones with RW and CGA from PT for safety. Increasing vertigo while performing weave through cones. Once dizziness decreased, pt instructed in VOR 1 HEP with cues for proper ROM and speed for central system vertigo management. Patient returned to room and performed stand pivot to recliner with no AD and min assist. Pt left sitting in recliner with call bell in reach and all needs met.         Therapy Documentation Precautions:  Precautions Precautions: Fall Restrictions Weight Bearing Restrictions: No    Vital Signs: Therapy Vitals Temp: 98.3 F (36.8 C) Pulse Rate: 63 Resp: 17 BP: 123/68 Patient Position (if appropriate): Lying Oxygen Therapy SpO2: 98 % O2 Device: Room Air Pain: Pain Assessment Pain Scale: 0-10 Pain Score: 0-No pain Mobility: Bed Mobility Bed Mobility: Rolling Right;Rolling Left;Sit to Supine;Supine to Sit Rolling Right: Supervision/verbal cueing Rolling Left: Supervision/Verbal cueing Supine to Sit: Supervision/Verbal cueing Sit to Supine: Supervision/Verbal cueing Transfers Transfers: Sit to Stand;Stand Pivot Transfers Sit to Stand: Minimal Assistance - Patient > 75% Stand Pivot Transfers: Minimal Assistance  - Patient > 75% Stand Pivot Transfer Details: Verbal cues for sequencing;Verbal cues for technique Transfer (Assistive device): None  Trunk/Postural Assessment : Cervical Assessment Cervical Assessment: Within Functional Limits Thoracic Assessment Thoracic Assessment: Within Functional Limits Lumbar Assessment Lumbar Assessment: Within Functional Limits Postural Control Postural Control: Within Functional Limits  Balance: Balance Balance Assessed: Yes Static Sitting Balance Static Sitting - Balance Support: No upper extremity supported Static Sitting - Level of Assistance: 6: Modified independent (Device/Increase time) Dynamic Sitting Balance Dynamic Sitting - Balance Support: No upper extremity supported Dynamic Sitting - Level of Assistance: 5: Stand by assistance Static Standing Balance Static Standing - Balance Support: Bilateral upper extremity supported Static Standing - Level of Assistance: 5: Stand by assistance Dynamic Standing Balance Dynamic Standing - Balance Support: No upper extremity supported Dynamic Standing - Level of Assistance: 4: Min assist    Therapy/Group: Individual Therapy  ALorie Phenix9/22/2021, 3:24 PM

## 2020-01-15 NOTE — Progress Notes (Signed)
   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 Allergy   . CVA (cerebral vascular accident) ([place])   . Hyperlipidemia LDL goal <70   . Hypertension   . Palpitations   . Vertigo, benign positional    Past Surgical History:  Past Surgical History:  Procedure Laterality Date  . CARDIOVERSION Left 10/31/2018   Procedure: CARDIOVERSION;  Surgeon: Acie Fredrickson Wonda Cheng, MD;  Location: [facility];  Service: Cardiovascular;  Laterality: Left;  . CARDIOVERSION N/A 01/08/2020   Procedure: CARDIOVERSION;  Surgeon: [name], MD;  Location: [facility] ENDOSCOPY;  Service: Cardiovascular;  Laterality: N/A;  . Hemilaminectomy and microdiskectomy at L4-5 on the left.  12/10/2003  . TEE WITHOUT CARDIOVERSION N/A 06/05/2018   Procedure: TRANSESOPHAGEAL ECHOCARDIOGRAM (TEE);  Surgeon: Lelon Perla, MD;  Location: [facility] ENDOSCOPY;  Service: Cardiovascular;  Laterality: N/A;  loop   Social History:  reports that she has never smoked. She has never used smokeless tobacco. She reports that she does not drink alcohol and does not use drugs.  Family / Support Systems Spouse/Significant Other: [name] Anticipated Caregiver: Spouse Ability/Limitations of Caregiver: Min-Mod Caregiver Availability: 24/7  Social History Preferred language: English Religion:  Read: Yes Write: Yes Employment Status: Retired Date Retired/Disabled/Unemployed: 15 years   Abuse/Neglect Abuse/Neglect Assessment Can Be Completed: Yes Physical Abuse: Denies Verbal Abuse: Denies Sexual Abuse: Denies Exploitation of patient/patient's resources: Denies Self-Neglect: Denies  Emotional Status Pt's affect, behavior and adjustment status: no Recent Psychosocial Issues: no Psychiatric History:  no Substance Abuse History: no  Patient / Family Perceptions, Expectations & Goals Pt/Family understanding of illness & functional limitations: yes, spouse updated Premorbid pt/family roles/activities: Independent/driving/singing/basketball games Anticipated changes in roles/activities/participation: Spouse to assist Pt/family expectations/goals: Goal MOD I  Recruitment consultant: None Premorbid Home Care/DME Agencies: None (Conservation officer, nature, Radio producer, Civil engineer, contracting) Transportation available at discharge: Spouse able to transport Resource referrals recommended: Neuropsychology  Discharge Planning Living Arrangements: Spouse/significant other Support Systems: Spouse/significant other Type of Residence: Private residence (1 level home; no steps, walk in shower (bench works best)) Administrator, sports: [employer] Money Management: Patient, Spouse Does the patient have any problems obtaining your medications?: No 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 Introduced self, explained role and process. Addressed questions and concerns, sw will continue to follow up.  Dyanne Iha 01/15/2020, 12:57 PM

## 2020-01-15 NOTE — Evaluation (Signed)
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 Allergy   . CVA (cerebral vascular accident) ([place])   . Hyperlipidemia LDL goal <70   . Hypertension   . Palpitations   . Vertigo, benign positional    Past Surgical History:  Past Surgical History:  Procedure Laterality Date  . CARDIOVERSION Left 10/31/2018   Procedure: CARDIOVERSION;  Surgeon: Acie Fredrickson Wonda Cheng, MD;  Location: [place];  Service: Cardiovascular;  Laterality: Left;  . CARDIOVERSION N/A 01/08/2020   Procedure: CARDIOVERSION;  Surgeon: [name], MD;  Location: [facility] ENDOSCOPY;  Service: Cardiovascular;  Laterality: N/A;  . Hemilaminectomy and microdiskectomy at L4-5 on the left.  12/10/2003  . TEE WITHOUT CARDIOVERSION N/A 06/05/2018   Procedure: TRANSESOPHAGEAL ECHOCARDIOGRAM (TEE);  Surgeon: Lelon Perla, MD;  Location: [facility] ENDOSCOPY;  Service: Cardiovascular;  Laterality: N/A;  loop    Assessment & Plan Clinical Impression: Patient is a36 year old RH- female with history of CAD, R-CVA, PAF s/p DCCV recently, who was admitted on 01/09/20 with acute onset of dizziness, slurred speech, nausea and vertigo while at cardiologist office. CTA head/neck showed moderate to severe stenosis at L-VA and moderate stenosis right P2 segment. MRI brain showed acute 3 mm infarct in R-SCA and evolution of prior R-MCA infarcts.  2D echo showed EF 60-65% with biatrial dilatation, moderately elevated PAH, mild MVR and  mild to moderate aortic valve sclerosis.  Stroke felt to be embolic due to A fib and Eliquis changed to pradaxa per Dr. Clydene Fake input. Her swallow function has improved and patient on regular diet. Patient with mild dysarthria with mild memory deficits, vestibular symptoms and balance deficits affecting activity tolerance as well as ADL tasks.  Patient transferred to CIR on 01/14/2020 .   Patient currently requires min with mobility secondary to muscle weakness and muscle joint tightness, decreased cardiorespiratoy endurance, unbalanced muscle activation, ataxia and decreased coordination, central origin and decreased sitting balance, decreased standing balance, hemiplegia and decreased balance strategies.  Prior to hospitalization, patient was independent  with mobility and lived with Spouse in a House home.  Home access is  Level entry.  Patient will benefit from skilled PT intervention to maximize safe functional mobility, minimize fall risk and decrease caregiver burden for planned discharge home with intermittent assist.  Anticipate patient will benefit from follow up [facility] at discharge.  PT - End of Session Activity Tolerance: Tolerates 10 - 20 min activity with multiple rests Endurance Deficit: Yes PT Assessment Rehab Potential (ACUTE/IP ONLY): Good PT Barriers to Discharge: Home environment access/layout PT Patient demonstrates impairments in the following area(s): Balance;Endurance;Motor PT Transfers Functional Problem(s): Bed Mobility;Bed to Chair;Car;Furniture;Floor PT Locomotion Functional Problem(s): Ambulation;Wheelchair Mobility;Stairs PT Plan PT Intensity: Minimum of 1-2 x/day ,45 to 90 minutes PT Frequency: 5 out of 7 days PT Duration Estimated Length of Stay: 6-9 days PT Treatment/Interventions: Ambulation/gait training;Balance/vestibular training;Community reintegration;DME/adaptive equipment instruction;Disease management/prevention;Discharge planning;Functional electrical  stimulation;Functional mobility training;Neuromuscular re-education;Pain management;Patient/family education;Skin care/wound management;Stair training;Psychosocial support;Therapeutic Activities;Splinting/orthotics;UE/LE Coordination activities;Therapeutic Exercise;UE/LE Strength taining/ROM;Wheelchair propulsion/positioning;Visual/perceptual remediation/compensation PT Transfers Anticipated  Outcome(s): Mod I with LRAD PT Locomotion Anticipated Outcome(s): Ambulatory Supervision assist with LRAD PT Recommendation Recommendations for Other Services: Therapeutic Recreation consult Therapeutic Recreation Interventions: Outing/community reintergration Follow Up Recommendations: Home health PT Patient destination: Home Equipment Recommended: To be determined Equipment Details: rollator   PT Evaluation Precautions/Restrictions   vertigo.  Pain Pain Assessment Pain Scale: 0-10 Pain Score: 0-No pain Home Living/Prior Functioning Home Living Available Help at Discharge: Family;Available 24 hours/day Type of Home: House Home Access: Level entry Home Layout: One level Bathroom Toilet: Standard Additional Comments: has not needed AD since last CVA after leaving CIR  Lives With: Spouse Prior Function Level of Independence: Independent with basic ADLs;Independent with homemaking with ambulation  Able to Take Stairs?: Yes Driving: Yes Comments: driving, able to do self care and manage house Vision/Perception  Vision - Assessment Eye Alignment: Within Functional Limits Ocular Range of Motion: Impaired-to be further tested in functional context (mild restriction in the lower R quadrant) Alignment/Gaze Preference: Within Defined Limits Tracking/Visual Pursuits: Decreased smoothness of eye movement to RIGHT inferior field;Decreased smoothness of horizontal tracking Saccades: Impaired - to be further tested in functional context (reports severe increase in dizziness following sacades) Additional  Comments: no nystagmus. reports mild blurring with sacades as well as dizziness Perception Perception: Within Functional Limits Praxis Praxis: Intact  Cognition Overall Cognitive Status: Within Functional Limits for tasks assessed Arousal/Alertness: Awake/alert Orientation Level: Oriented X4 Sensation Sensation Light Touch: Appears Intact Proprioception: Appears Intact Coordination Gross Motor Movements are Fluid and Coordinated: No Fine Motor Movements are Fluid and Coordinated: No Coordination and Movement Description: mild Ataxia on the R Finger Nose Finger Test: dysmetric on the R Heel Shin Test: mild ataxia on the R Motor  Motor Motor: Ataxia;Hemiplegia Motor - Skilled Clinical Observations: mild R sided hemiplegia dn ataxia   Trunk/Postural Assessment  Cervical Assessment Cervical Assessment: Within Functional Limits Thoracic Assessment Thoracic Assessment: Within Functional Limits Lumbar Assessment Lumbar Assessment: Within Functional Limits Postural Control Postural Control: Within Functional Limits  Balance Balance Balance Assessed: Yes Standardized Balance Assessment Standardized Balance Assessment: Timed Up and Go Test Timed Up and Go Test TUG: Normal TUG Normal TUG (seconds): 69 Static Sitting Balance Static Sitting - Balance Support: No upper extremity supported Static Sitting - Level of Assistance: 6: Modified independent (Device/Increase time) Dynamic Sitting Balance Dynamic Sitting - Balance Support: No upper extremity supported Dynamic Sitting - Level of Assistance: 5: Stand by assistance Static Standing Balance Static Standing - Level of Assistance: 5: Stand by assistance Dynamic Standing Balance Dynamic Standing - Balance Support: No upper extremity supported Dynamic Standing - Level of Assistance: 4: Min assist Extremity Assessment      RLE Assessment RLE Assessment: Exceptions to [facility] General Strength Comments: hip flexion 4/5. all others  tested 4+/5 LLE Assessment LLE Assessment: Within Functional Limits General Strength Comments: grossly 4+/5 proximal to distal  Care Tool Care Tool Bed Mobility Roll left and right activity   Roll left and right assist level: Supervision/Verbal cueing    Sit to lying activity   Sit to lying assist level: Supervision/Verbal cueing    Lying to sitting edge of bed activity   Lying to sitting edge of bed assist level: Supervision/Verbal cueing     Care Tool Transfers Sit to stand transfer   Sit to stand assist level: Minimal Assistance - Patient > 75%    Chair/bed transfer   Chair/bed transfer assist level: Minimal Assistance - Patient > 75%     Toilet transfer   Assist  Level: Minimal Assistance - Patient > 75%    Car transfer   Car transfer assist level: Minimal Assistance - Patient > 75%      Care Tool Locomotion Ambulation   Assist level: Minimal Assistance - Patient > 75% Assistive device: Hand held assist Max distance: 20  Walk 10 feet activity   Assist level: Minimal Assistance - Patient > 75% Assistive device: Hand held assist   Walk 50 feet with 2 turns activity Walk 50 feet with 2 turns activity did not occur: Safety/medical concerns      Walk 150 feet activity Walk 150 feet activity did not occur: Safety/medical concerns   Assistive device: Walker-rolling  Walk 10 feet on uneven surfaces activity Walk 10 feet on uneven surfaces activity did not occur: Safety/medical concerns      Stairs Stair activity did not occur: Safety/medical concerns        Walk up/down 1 step activity Walk up/down 1 step or curb (drop down) activity did not occur: Safety/medical concerns     Walk up/down 4 steps activity did not occuR: Safety/medical concerns  Walk up/down 4 steps activity      Walk up/down 12 steps activity Walk up/down 12 steps activity did not occur: Safety/medical concerns      Pick up small objects from floor Pick up small object from the floor (from  standing position) activity did not occur: Safety/medical concerns      Wheelchair Will patient use wheelchair at discharge?: No     Wheelchair assist level: Minimal Assistance - Patient > 75% Max wheelchair distance: 150  Wheel 50 feet with 2 turns activity   Assist Level: Minimal Assistance - Patient > 75%  Wheel 150 feet activity   Assist Level: Minimal Assistance - Patient > 75%    Refer to Care Plan for Long Term Goals  SHORT TERM GOAL WEEK 1 PT Short Term Goal 1 (Week 1): STG=LTG due ELOS  Recommendations for other services: Therapeutic Recreation  Outing/community reintegration   Skilled Therapeutic Intervention  Pt received supine in bed and agreeable to PT. Supine>sit transfer with supervision and stand pivot transfer to [place]. Reports need to urinate, stand pivot transfer to and from toilet with min assist for safety.  PT instructed patient in PT Evaluation and initiated treatment intervention; see below for results. PT educated patient in [place], rehab potential, rehab goals, and discharge recommendations. PT instructed pt in TUG: 69 sec (average of 3 trials; >13.5 sec indicates increased fall risk).  Pt performed 5xSTS: 48 sec (>15 sec indicates increased fall risk). Gait training with RW and without AD. CGA with RW, min assist without AD as listed.   Pt returned to room and performed stand pivot transfer to bed with CGA and no AD. Sit>supine completed with supervision assist for safety, and left supine in bed with call bell in reach and all needs met.        Mobility Bed Mobility Bed Mobility: Rolling Right;Rolling Left;Sit to Supine;Supine to Sit Rolling Right: Supervision/verbal cueing Rolling Left: Supervision/Verbal cueing Supine to Sit: Supervision/Verbal cueing Sit to Supine: Supervision/Verbal cueing Transfers Transfers: Sit to Stand;Stand Pivot Transfers Sit to Stand: Minimal Assistance - Patient > 75% Stand Pivot Transfers: Minimal Assistance - Patient >  75% Stand Pivot Transfer Details: Verbal cues for sequencing;Verbal cues for technique Transfer (Assistive device): None Locomotion  Gait Ambulation: Yes Gait Assistance: Minimal Assistance - Patient > 75% Gait Distance (Feet): 20 Feet Assistive device: None;1 person hand held assist Gait Assistance Details:  Verbal cues for gait pattern;Verbal cues for precautions/safety Gait Assistance Details: verbal cues for gaze stabilization Gait Gait: Yes Gait Pattern: Impaired Gait Pattern: Right flexed knee in stance;Narrow base of support;Step-through pattern;Decreased stride length Stairs / Additional Locomotion Stairs: Yes Stairs Assistance: Minimal Assistance - Patient > 75% Stair Management Technique: Two rails Number of Stairs: 2 Height of Stairs: 6 Wheelchair Mobility Wheelchair Mobility: Yes Wheelchair Assistance: Chartered loss adjuster: Both upper extremities Distance: 50   Discharge Criteria: Patient will be discharged from PT if patient refuses treatment 3 consecutive times without medical reason, if treatment goals not met, if there is a change in medical status, if patient makes no progress towards goals or if patient is discharged from hospital.  The above assessment, treatment plan, treatment alternatives and goals were discussed and mutually agreed upon: by patient  Lorie Phenix 01/15/2020, 10:23 AM

## 2020-01-15 NOTE — Patient Care Conference (Signed)
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 [name] Care Coodinator Present: Dorien Chihuahua, RN, BSN, CRRN;Christina [place], BSW Nurse Present: Benjie Karvonen, RN PT Present: Barrie Folk, PT OT Present: Turner Daniels, OT SLP Present: Charolett Bumpers, SLP PPS Coordinator present : Gunnar Fusi, SLP     Current Status/Progress Goal Weekly Team Focus  Bowel/Bladder   Patient is continent of bladder/bowel LBM 07/14/19  Maintain continence  Assess toileting needs QS/PRN, address concerns in a timely manner,   Swallow/Nutrition/ Hydration             ADL's             Mobility             Communication             Safety/Cognition/ Behavioral Observations     Goal </= 3  QS/PRN assess and evaluate concerns   Pain   Patient denies pain at this time         Skin   Sin intact  Maintain skin integrit  QS/PRN skin assessment , document new issues/concerns and interventions     Discharge Planning:    Home with spouse  Team Discussion: Not all evals completed at the time of conference. Previous rehab patient with DME from last visit. Cerebellar like issues this stroke with vertigo. Appears back to baseline for cognitive status after previous stroke.  Patient on target to meet rehab goals: yes ,  supervision goals.  *See Care Plan and progress notes for long and short-term goals.   Revisions to Treatment Plan:  SLP addressed GERD/eating and drinking timing. Teaching Needs: Dysarthria, transfers, medications, secondary stroke risks, etc.  Current Barriers to Discharge: None  Possible Resolutions to Barriers:  Family education on mild memory deficits and dysarthria     Medical Summary               I attest that I was present, lead the team conference, and concur with the assessment and plan of the team.   Dorien Chihuahua B 01/15/2020, 2:21 PM

## 2020-01-15 NOTE — Progress Notes (Signed)
Patient ID: Mary Mercado, female   DOB: [birth year], 79 y.o.   MRN: [id number] Team Conference Report to Patient/Family  Team Conference discussion was reviewed with the patient and caregiver, including goals, any changes in plan of care and target discharge date.  Patient and caregiver express understanding and are in agreement.  The patient has a target discharge date of  (TBD after all evals completed; week).  Dyanne Iha 01/15/2020, 1:15 PM

## 2020-01-15 NOTE — Discharge Instructions (Addendum)
Inpatient Rehab Discharge Instructions  Mary Mercado Discharge date and time:  01/22/20  Activities/Precautions/ Functional Status: Activity: no lifting, driving, or strenuous exercise  till cleared by MD Diet: cardiac diet and diabetic diet Wound Care: none needed   Functional status:  ___ No restrictions     ___ Walk up steps independently _X__ 24/7 supervision/assistance   ___ Walk up steps with assistance ___ Intermittent supervision/assistance  ___ Bathe/dress independently ___ Walk with walker     _X__ Bathe/dress with assistance ___ Walk Independently    ___ Shower independently ___ Walk with assistance    ___ Shower with assistance _X__ No alcohol     ___ Return to work/school ________   Special Instructions:   COMMUNITY REFERRALS UPON DISCHARGE:    Outpatient: PT     OT   [facility]             Agency: [facility] Phone: [phone]              Appointment Date/Time: Facility to determine at discharge  Medical Equipment/Items Ordered: Rollater                                                 Agency/Supplier: [employer]   STROKE/TIA DISCHARGE INSTRUCTIONS SMOKING Cigarette smoking nearly doubles your risk of having a stroke & is the single most alterable risk factor  If you smoke or have smoked in the last 12 months, you are advised to quit smoking for your health.  Most of the excess cardiovascular risk related to smoking disappears within a year of stopping.  Ask you doctor about anti-smoking medications  [place] Quit Line: 1-800-QUIT NOW  Free Smoking Cessation Classes [phone]  CHOLESTEROL Know your levels; limit fat & cholesterol in your diet  Lipid Panel     Component Value Date/Time   CHOL 183 01/10/2020 0550   TRIG 52 01/10/2020 0550   HDL 92 01/10/2020 0550   CHOLHDL 2.0 01/10/2020 0550   VLDL 10 01/10/2020 0550   LDLCALC 81 01/10/2020 0550   LDLCALC 90 11/14/2019 1102      Many patients benefit from  treatment even if their cholesterol is at goal.  Goal: Total Cholesterol (CHOL) less than 160  Goal:  Triglycerides (TRIG) less than 150  Goal:  HDL greater than 40  Goal:  LDL (LDLCALC) less than 100   BLOOD PRESSURE American Stroke Association blood pressure target is less that 120/80 mm/Hg  Your discharge blood pressure is:  BP: 136/85  Monitor your blood pressure  Limit your salt and alcohol intake  Many individuals will require more than one medication for high blood pressure  DIABETES (A1c is a blood sugar average for last 3 months) Goal HGBA1c is under 7% (HBGA1c is blood sugar average for last 3 months)  Diabetes: Pre-diabetes    Lab Results  Component Value Date   HGBA1C 5.9 (H) 01/10/2020     Your HGBA1c can be lowered with medications, healthy diet, and exercise.  Check your blood sugar as directed by your physician  Call your physician if you experience unexplained or low blood sugars.  PHYSICAL ACTIVITY/REHABILITATION Goal is 30 minutes at least 4 days per week  Activity: No driving, Therapies: see above Return to work: N/A  Activity decreases your risk of heart attack and stroke and makes your heart  stronger.  It helps control your weight and blood pressure; helps you relax and can improve your mood.  Participate in a regular exercise program.  Talk with your doctor about the best form of exercise for you (dancing, walking, swimming, cycling).  DIET/WEIGHT Goal is to maintain a healthy weight  Your discharge diet is:  Diet Order            Diet Heart Room service appropriate? Yes; Fluid consistency: Thin  Diet effective now               liquids Your height is:  Height: 5\' 5"  (165.1 cm) Your current weight is: Weight: 62.9 kg Your Body Mass Index (BMI) is:  BMI (Calculated): 23.08  Following the type of diet specifically designed for you will help prevent another stroke.  Your goal weight:    Your goal Body Mass Index (BMI) is 19-24.  Healthy  food habits can help reduce 3 risk factors for stroke:  High cholesterol, hypertension, and excess weight.  RESOURCES Stroke/Support Group:  Call [phone]   STROKE EDUCATION PROVIDED/REVIEWED AND GIVEN TO PATIENT Stroke warning signs and symptoms How to activate emergency medical system (call 911). Medications prescribed at discharge. Need for follow-up after discharge. Personal risk factors for stroke. Pneumonia vaccine given:  Flu vaccine given:  My questions have been answered, the writing is legible, and I understand these instructions.  I will adhere to these goals & educational materials that have been provided to me after my discharge from the hospital.     My questions have been answered and I understand these instructions. I will adhere to these goals and the provided educational materials after my discharge from the hospital.  Patient/Caregiver Signature _______________________________ Date __________  Clinician Signature _______________________________________ Date __________  Please bring this form and your medication list with you to all your follow-up doctor's appointments.    Information on my medicine - Pradaxa (dabigatran)  Why was Pradaxa prescribed for you? Pradaxa was prescribed for you to reduce the risk of forming blood clots that cause a stroke if you have a medical condition called atrial fibrillation (a type of irregular heartbeat).    What do you Need to know about PradAXa? Take your Pradaxa TWICE DAILY - one capsule in the morning and one tablet in the evening with or without food.  It would be best to take the doses about the same time each day.  The capsules should not be broken, chewed or opened - they must be swallowed whole.  Do not store Pradaxa in other medication containers - once the bottle is opened the Pradaxa should be used within FOUR months; throw away any capsules that haven't been by that time.  Take Pradaxa exactly as prescribed  by your doctor.  DO NOT stop taking Pradaxa without talking to the doctor who prescribed the medication.  Stopping without other stroke prevention medication to take the place of Pradaxa may increase your risk of developing a clot that causes a stroke.  Refill your prescription before you run out.  After discharge, you should have regular check-up appointments with your healthcare provider that is prescribing your Pradaxa.  In the future your dose may need to be changed if your kidney function or weight changes by a significant amount.  What do you do if you miss a dose? If you miss a dose, take it as soon as you remember on the same day.  If your next dose is less than 6 hours away,  skip the missed dose.  Do not take two doses of PRADAXA at the same time.  Important Safety Information A possible side effect of Pradaxa is bleeding. You should call your healthcare provider right away if you experience any of the following: ? Bleeding from an injury or your nose that does not stop. ? Unusual colored urine (red or dark brown) or unusual colored stools (red or black). ? Unusual bruising for unknown reasons. ? A serious fall or if you hit your head (even if there is no bleeding).  Some medicines may interact with Pradaxa and might increase your risk of bleeding or clotting while on Pradaxa. To help avoid this, consult your healthcare provider or pharmacist prior to using any new prescription or non-prescription medications, including herbals, vitamins, non-steroidal anti-inflammatory drugs (NSAIDs) and supplements.  This website has more information on Pradaxa (dabigatran): [email or web address]://www.pradaxa.com

## 2020-01-15 NOTE — Progress Notes (Signed)
[place] PHYSICAL MEDICINE & REHABILITATION PROGRESS NOTE   Subjective/Complaints:  No issues overnite , discussed PT eval  Pt reports good compliance with Eliquis, now on Pradaxa due to CVA while taking Eliquis   ROS- neg CP, SOB, N/V/D   Objective:   No results found. Recent Labs    01/14/20 0433 01/15/20 0705  WBC 7.3 7.3  HGB 14.2 15.1*  HCT 42.5 46.3*  PLT 233 242   Recent Labs    01/14/20 0433 01/15/20 0705  NA 141 138  K 3.1* 3.4*  CL 103 99  CO2 27 27  GLUCOSE 110* 123*  BUN 8 10  CREATININE 0.65 0.62  CALCIUM 9.0 9.3    Intake/Output Summary (Last 24 hours) at 01/15/2020 0915 Last data filed at 01/14/2020 1842 Gross per 24 hour  Intake 120 ml  Output --  Net 120 ml        Physical Exam: Vital Signs Blood pressure (!) 147/73, pulse (!) 59, temperature 98.5 F (36.9 C), temperature source Oral, resp. rate 14, height 5\' 5"  (1.651 m), weight 62.9 kg, SpO2 96 %.  General: No acute distress Mood and affect are appropriate Heart: Regular rate and rhythm no rubs murmurs or extra sounds Lungs: Clear to auscultation, breathing unlabored, no rales or wheezes Abdomen: Positive bowel sounds, soft nontender to palpation, nondistended Extremities: No clubbing, cyanosis, or edema Skin: No evidence of breakdown, no evidence of rash Neurologic: Cranial nerves II through XII intact, motor strength is 5/5 in bilateral deltoid, bicep, tricep, grip, hip flexor, knee extensors, ankle dorsiflexor and plantar flexor Sensory exam normal sensation to light touch and proprioception in bilateral upper and lower extremities Mild dizziness with tracking  Cerebellar exam normal finger to nose to finger LUE and  as well as heel to shin in bilateral upper and lower extremities Mild dysmetria RUE  Musculoskeletal: Full range of motion in all 4 extremities. No joint swelling    Assessment/Plan: 1. 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  Physiatrist is providing close team supervision and 24 hour management of active medical problems listed below.  Physiatrist and rehab team continue to assess barriers to discharge/monitor patient progress toward functional and medical goals  Care Tool:  Bathing    Body parts bathed by patient: Face         Bathing assist Assist Level: Set up assist     Upper Body Dressing/Undressing Upper body dressing        Upper body assist      Lower Body Dressing/Undressing Lower body dressing            Lower body assist       Toileting Toileting    Toileting assist Assist for toileting: Contact Guard/Touching assist     Transfers Chair/bed transfer  Transfers assist     Chair/bed transfer assist level: Minimal Assistance - Patient > 75%     Locomotion Ambulation   Ambulation assist              Walk 10 feet activity   Assist           Walk 50 feet activity   Assist           Walk 150 feet activity   Assist           Walk 10 feet on uneven surface  activity   Assist  Wheelchair     Assist               Wheelchair 50 feet with 2 turns activity    Assist            Wheelchair 150 feet activity     Assist          Blood pressure (!) 147/73, pulse (!) 59, temperature 98.5 F (36.9 C), temperature source Oral, resp. rate 14, height 5\' 5"  (1.651 m), weight 62.9 kg, SpO2 96 %.  Medical Problem List and Plan: 1.  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  Antithrombotics: -DVT/anticoagulation:  Pharmaceutical: Pradexa--family aware and agreeable of $118.80/- copay. (changed from Eliquis)             -antiplatelet therapy: N/A 3. Dysesthesias/Pain Management: Off Cymbalta due to SE.  4. Mood: LCSW to  follow for evaluation and support.              -antipsychotic agents: N/A 5. Neuropsych: This patient is capable of making decisions on her own behalf. 6. Skin/Wound Care: Routine pressure relief measures.  7. Fluids/Electrolytes/Nutrition: Monitor I/O. Check lytes in am.  8. PAF: Monitor HR tid--on Flecainide and diltiazem for rate control. .  9. Hypokalemia: Will add additional supplement X 2 due to history of arrthymia 10.  Pre-diabetes: Hgb A1c- 5.9. Add CM restrictions to diet. Will have RD educate on HH/CM diet. 11. Dyslipidemia: Lipitor was increased to 80 mg/day 12. HTN- on diltiazem; Micardis being held.  Vitals:   01/14/20 2008 01/15/20 0458  BP: (!) 147/84 (!) 147/73  Pulse: 65 (!) 59  Resp: 16 14  Temp: 98.4 F (36.9 C) 98.5 F (36.9 C)  SpO2: 97% 96%  may need med adjustment 13. Initial dysphagia and urinary retention- resolved.    LOS: 1 days A FACE TO FACE EVALUATION WAS PERFORMED  Charlett Blake 01/15/2020, 9:15 AM

## 2020-01-16 ENCOUNTER — Inpatient Hospital Stay (HOSPITAL_COMMUNITY): Payer: Medicare Other | Admitting: Physical Therapy

## 2020-01-16 ENCOUNTER — Inpatient Hospital Stay (HOSPITAL_COMMUNITY): Payer: Medicare Other | Admitting: Occupational Therapy

## 2020-01-16 ENCOUNTER — Inpatient Hospital Stay (HOSPITAL_COMMUNITY): Payer: Medicare Other | Admitting: Speech Pathology

## 2020-01-16 MED ORDER — POTASSIUM CHLORIDE CRYS ER 10 MEQ PO TBCR
10.0000 meq | EXTENDED_RELEASE_TABLET | Freq: Every day | ORAL | Status: DC
  Administered 2020-01-16 – 2020-01-22 (×7): 10 meq via ORAL
  Filled 2020-01-16 (×7): qty 1

## 2020-01-16 NOTE — Progress Notes (Signed)
Occupational Therapy Session Note  Patient Details  Name: Mary Mercado MRN: [id number] Date of Birth: 03-14-1941  Today's Date: 01/16/2020 OT Individual Time: 9198-0221 OT Individual Time Calculation (min): 60 min   OT Individual Time: 1300-1415 OT Individual Time Calculation (min): 75 min    Short Term Goals: Week 1:  OT Short Term Goal 1 (Week 1): STG=LTG 2/2 ELOS  Skilled Therapeutic Interventions/Progress Updates:  Session 1: Patient met seated EOB in agreement with OT treatment session. RN administered medication prior to start of session. Functional mobility to sink surface with hand held assist in prep for BADLs. Patient completed 2/3 grooming tasks in standing with hips braced on sink surface to steady. Functional mobility to therapy gym in prep for therapeutic activity with hand held assist, occasional use of railing. Dynamic standing balance on blue foam with incorporation of bending/reaching outside of BOS. Patient able to pick up items from the floor with Min guard and no LOB. Assessment of symptoms of vertigo including nystagmus throughout treatment session after change in position. OT education on [facility] HEP. Patient able to read each line without difficulty reporting no changes in vision and complete activities as indicated with occasional cueing. Noted tremor in R hand. Patient notes tremor is new since recent CVA. Patient c/o nausea at conclusion of session. No c/o dizziness or light headedness. Total A for wc transport back to room. Session concluded with patient lying supine in bed with call bell within reach and all needs met.   Session 2: Patient with request for showering this session. Patient completed bathing/dressing at shower level in sitting/standing. Functional mobility from bathroom to sink level with RW. Seated at sink level, patient able to style hair with set-up assist and apply face lotion. Education on safety with rollator in home with emphasis on  bathroom/kitchen mobility and locking/unlocking breaks. Patient demonstrates understanding with ability to manage breaks with minimal cueing. Patient reports feeling "much better" after shower. Functional mobility from room to hospital atrium with use of rollator with emphasis on balance and endurance. After seated rest break, patient able to walk around hospital gift shop and back to room in the same manner as indicated above. Toileting/hygiene/clothing management with supervision A. Session concluded with patient lying supine in bed with call bell within reach and all needs met.   Therapy Documentation Precautions:  Precautions Precautions: Fall Restrictions Weight Bearing Restrictions: No General:    Therapy/Group: Individual Therapy  Mary Mercado 01/16/2020, 7:22 AM

## 2020-01-16 NOTE — Progress Notes (Signed)
[place] PHYSICAL MEDICINE & REHABILITATION PROGRESS NOTE   Subjective/Complaints:   No issues overnite , slept well, sitting at EOB feeding herself breakfast   ROS- neg CP, SOB, N/V/D   Objective:   No results found. Recent Labs    01/14/20 0433 01/15/20 0705  WBC 7.3 7.3  HGB 14.2 15.1*  HCT 42.5 46.3*  PLT 233 242   Recent Labs    01/14/20 0433 01/15/20 0705  NA 141 138  K 3.1* 3.4*  CL 103 99  CO2 27 27  GLUCOSE 110* 123*  BUN 8 10  CREATININE 0.65 0.62  CALCIUM 9.0 9.3    Intake/Output Summary (Last 24 hours) at 01/16/2020 0701 Last data filed at 01/15/2020 1823 Gross per 24 hour  Intake 620 ml  Output --  Net 620 ml        Physical Exam: Vital Signs Blood pressure (!) 150/86, pulse 65, temperature 97.8 F (36.6 C), resp. rate 18, height 5\' 5"  (1.651 m), weight 62.9 kg, SpO2 92 %.   General: No acute distress Mood and affect are appropriate Heart: Regular rate and rhythm no rubs murmurs or extra sounds Lungs: Clear to auscultation, breathing unlabored, no rales or wheezes Abdomen: Positive bowel sounds, soft nontender to palpation, nondistended Extremities: No clubbing, cyanosis, or edema   Mild dysmetria RUE  Musculoskeletal: Full range of motion in all 4 extremities. No joint swelling    Assessment/Plan: 1. 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  Physiatrist is providing close team supervision and 24 hour management of active medical problems listed below.  Physiatrist and rehab team continue to assess barriers to discharge/monitor patient progress toward functional and medical goals  Care Tool:  Bathing    Body parts bathed by patient: Right arm, Face, Left arm, Chest, Abdomen, Front perineal area, Right upper leg, Left upper leg, Right lower leg, Left lower leg   Body parts bathed by helper: Buttocks      Bathing assist Assist Level: Minimal Assistance - Patient > 75%     Upper Body Dressing/Undressing Upper body dressing   What is the patient wearing?: Pull over shirt    Upper body assist Assist Level: Supervision/Verbal cueing    Lower Body Dressing/Undressing Lower body dressing      What is the patient wearing?: Underwear/pull up, Pants     Lower body assist Assist for lower body dressing: Contact Guard/Touching assist     Toileting Toileting    Toileting assist Assist for toileting: Contact Guard/Touching assist     Transfers Chair/bed transfer  Transfers assist     Chair/bed transfer assist level: Contact Guard/Touching assist     Locomotion Ambulation   Ambulation assist      Assist level: Minimal Assistance - Patient > 75% Assistive device: Hand held assist Max distance: 20   Walk 10 feet activity   Assist     Assist level: Minimal Assistance - Patient > 75% Assistive device: Hand held assist   Walk 50 feet activity   Assist Walk 50 feet with 2 turns activity did not occur: Safety/medical concerns         Walk 150 feet activity   Assist Walk 150 feet activity did not occur: Safety/medical concerns    Assistive device: Walker-rolling    Walk 10 feet on uneven surface  activity   Assist Walk 10 feet on uneven surfaces activity did not occur: Safety/medical  concerns         Wheelchair     Assist Will patient use wheelchair at discharge?: No      Wheelchair assist level: Minimal Assistance - Patient > 75% Max wheelchair distance: 150    Wheelchair 50 feet with 2 turns activity    Assist        Assist Level: Minimal Assistance - Patient > 75%   Wheelchair 150 feet activity     Assist      Assist Level: Minimal Assistance - Patient > 75%   Blood pressure (!) 150/86, pulse 65, temperature 97.8 F (36.6 C), resp. rate 18, height 5\' 5"  (1.651 m), weight 62.9 kg, SpO2 92 %.  Medical Problem List  and Plan: 1.  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  Antithrombotics: -DVT/anticoagulation:  Pharmaceutical: Pradexa--family aware and agreeable of $118.80/- copay. (changed from Eliquis)             -antiplatelet therapy: N/A 3. Dysesthesias/Pain Management: Off Cymbalta due to SE.  4. Mood: LCSW to follow for evaluation and support.              -antipsychotic agents: N/A 5. Neuropsych: This patient is capable of making decisions on her own behalf. 6. Skin/Wound Care: Routine pressure relief measures.  7. Fluids/Electrolytes/Nutrition: Monitor I/O. Check lytes in am.  8. PAF: Monitor HR tid--on Flecainide and diltiazem for rate control. .  9. Hypokalemia: Will add additional supplement X 2 , still mildly low , siupplement KCL 76meq qd 10.  Pre-diabetes: Hgb A1c- 5.9. Add CM restrictions to diet. Will have RD educate on HH/CM diet. 11. Dyslipidemia: Lipitor was increased to 80 mg/day 12. HTN- on diltiazem; Micardis being held.  Vitals:   01/15/20 1952 01/16/20 0615  BP: (!) 146/76 (!) 150/86  Pulse: 65 65  Resp: 19 18  Temp: 98.8 F (37.1 C) 97.8 F (36.6 C)  SpO2: 99% 92%  on cardizem CD 120mg  per day  13. Initial dysphagia and urinary retention- resolved.    LOS: 2 days A FACE TO FACE EVALUATION WAS PERFORMED  Charlett Blake 01/16/2020, 7:01 AM

## 2020-01-16 NOTE — Progress Notes (Signed)
[place] for Pradaxa Indication: atrial fibrillation and stroke  Allergies  Allergen Reactions  . Duloxetine     Causes Bp elevation.  Increased body jerks, nausea  . Acetaminophen Hives and Rash    Patient Measurements: Height: 5\' 5"  (165.1 cm) Weight: 62.9 kg (138 lb 10.7 oz) IBW/kg (Calculated) : 57  Vital Signs: Temp: 97.8 F (36.6 C) (09/23 0615) BP: 150/86 (09/23 0615) Pulse Rate: 65 (09/23 0615)  Labs: Recent Labs    01/14/20 0433 01/15/20 0705  HGB 14.2 15.1*  HCT 42.5 46.3*  PLT 233 242  CREATININE 0.65 0.62    Estimated Creatinine Clearance: 50.5 mL/min (by C-G formula based on SCr of 0.62 mg/dL).   Medical History: Past Medical History:  Diagnosis Date  . Allergy   . CVA (cerebral vascular accident) ([place])   . Hyperlipidemia LDL goal <70   . Hypertension   . Palpitations   . Vertigo, benign positional    Assessment: 79 yr old female admitted 01/10/20 with new stroke. Had been taking Eliquis 5 mg BID PTA for atrial fibrillation.  Now changing to Pradaxa.   Has been on Lovenox 40 mg SQ q24hrs for VTE prophylaxis.   Remains on pradaxa 150 mg BID - Hgb 15.1, plt 242. No s/sx of bleeding. Scr stable on last check 9/22 at 0.62 (CrCl 50 mL/min).   Goal of Therapy:  appropriate Pradaxa dose for indication Monitor platelets by anticoagulation protocol: Yes   Plan:  Continue Pradaxa 150 mg PO BID Monitor CBC, Scr trend, and for s/sx of bleeding   Antonietta Jewel, PharmD, [place] Pharmacist  Phone: [phone] 01/16/2020 9:36 AM  Please check AMION for all [place] phone numbers After 10:00 PM, call [place] [phone]

## 2020-01-16 NOTE — Progress Notes (Signed)
Speech Language Pathology Daily Session Note  Patient Details  Name: Mary Mercado MRN: [id number] Date of Birth: 06-01-1940  Today's Date: 01/16/2020 SLP Individual Time: 1500-1530 SLP Individual Time Calculation (min): 30 min  Short Term Goals: Week 1: SLP Short Term Goal 1 (Week 1): STG=LTG due to short ELOS  Skilled Therapeutic Interventions:   Patient seen for skilled ST focusing on speech articulation goals. Patient sat edge of bed with husband present in room. She was able to recall and verbalize SLP instructing her to slow down when speaking. She exhibited a mild dysarthria at conversational level with 85% intelligibility but main deficit in quality of speech. During oral reading, she exhibited imprecise consonant production mainly when producing multisyllabic words. She was able to return demonstrate in practicing exaggerated articulation at word level then transitioning to phrase level. SLP left materials and instructions for patient to work on and patient demonstrated understanding. Plan to discuss patient's dysphagia which she described as mainly difficulty with swallowing solids if not chewed thoroughly and if she doesn't drink liquids after. Patient continues to benefit from skilled SLP intervention to maximize speech prior to discharge. Pain Pain Assessment Pain Scale: 0-10 Pain Score: 0-No pain  Therapy/Group: Individual Therapy  Sonia Baller, MA, CCC-SLP Speech Therapy

## 2020-01-16 NOTE — Progress Notes (Signed)
Physical Therapy Session Note  Patient Details  Name: Mary Mercado MRN: [id number] Date of Birth: 02-13-1941  Today's Date: 01/16/2020 PT Individual Time:1015-1100   45 min   Short Term Goals: Week 1:  PT Short Term Goal 1 (Week 1): STG=LTG due ELOS  Skilled Therapeutic Interventions/Progress Updates:   Pt received supine in bed and agreeable to PT. Supine>sit transfer without assist or cues. Pt transported to rehab gym in [facility]. Gait training with Rollator x 138f, and 517f 2. As well as 1503fo room. Pt also performed gait training without AD x [id number] CGA from PT. Noted to have increased step length and width with rollator vs no AD.   All transfers performed with supervision assist throughout session with and without AD.   PT instructed pt in VOR 1 and 2 on quiet background 3 x 3 sec for each direction. Cues for deceased ROM and proper speed to allow only slight increased in vertigo s/s. Pt reports s/s to increased from 4 to 7 and 30sec to 1 min to return to base line.   Pt returned to room and performed ambulatory transfer to bed with rollator and supervision assist. Pt doffed shoes sitting EOB with increased time and no assist. Sit>supine completed without assist and left supine in bed with call bell in reach and all needs met.         Therapy Documentation Precautions:  Precautions Precautions: Fall Restrictions Weight Bearing Restrictions: No Pain: denies  Therapy/Group: Individual Therapy  AustLorie Phenix3/2021, 10:52 AM

## 2020-01-17 ENCOUNTER — Inpatient Hospital Stay (HOSPITAL_COMMUNITY): Payer: Medicare Other | Admitting: Speech Pathology

## 2020-01-17 ENCOUNTER — Inpatient Hospital Stay (HOSPITAL_COMMUNITY): Payer: Medicare Other | Admitting: Occupational Therapy

## 2020-01-17 ENCOUNTER — Inpatient Hospital Stay (HOSPITAL_COMMUNITY): Payer: Medicare Other | Admitting: Physical Therapy

## 2020-01-17 NOTE — IPOC Note (Signed)
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[place])     Functional Problem List: Nursing Pain, Medication Management  PT Balance, Endurance, Motor  OT Balance, Endurance, Motor, Safety, Vision  SLP    TR         Basic ADL's: OT Grooming, Bathing, Dressing, Toileting     Advanced  ADL's: OT       Transfers: PT Bed Mobility, Bed to Chair, Car, Furniture, Floor  OT Toilet, Tub/Shower     Locomotion: PT Ambulation, Emergency planning/management officer, Stairs     Additional Impairments: OT    SLP Communication expression    TR      Anticipated Outcomes Item Anticipated Outcome  Self Feeding Independent  Swallowing      Basic self-care  Mod I  Toileting  Mod I   Bathroom Transfers Supervision  Bowel/Bladder  mod I  Transfers  Mod I with LRAD  Locomotion  Ambulatory Supervision assist with LRAD  Communication  Mod I  Cognition     Pain  less than 3 out of 10  Safety/Judgment  mod I   Therapy Plan: PT Intensity: Minimum of 1-2 x/day ,45 to 90 minutes PT Frequency: 5 out of 7 days PT Duration Estimated Length of Stay: 6-9 days OT Intensity: Minimum of 1-2 x/day, 45 to 90 minutes OT Frequency: 5 out of 7 days OT Duration/Estimated Length of Stay: 7-10 days SLP Intensity: Minumum of 1-2 x/day, 30 to 90 minutes SLP Frequency: 3 to 5 out of 7 days SLP Duration/Estimated Length of Stay: 7-10 days   Due to the current state of emergency, patients may not be receiving their 3-hours of Medicare-mandated therapy.   Team Interventions: Nursing Interventions Patient/Family Education, Pain Management, Skin Care/Wound Management, Discharge Planning, Medication Management  PT interventions Ambulation/gait training, Training and development officer,  Community reintegration, DME/adaptive equipment instruction, Disease management/prevention, Discharge planning, Functional electrical stimulation, Functional mobility training, Neuromuscular re-education, Pain management, Patient/family education, Skin care/wound management, Stair training, Psychosocial support, Therapeutic Activities, Splinting/orthotics, UE/LE Coordination activities, Therapeutic Exercise, UE/LE Strength taining/ROM, Wheelchair propulsion/positioning, Visual/perceptual remediation/compensation  OT Interventions Training and development officer, Community reintegration, Discharge planning, DME/adaptive equipment instruction, Functional mobility training, Self Care/advanced ADL retraining, Therapeutic Activities, Therapeutic Exercise, UE/LE Strength taining/ROM, UE/LE Coordination activities, Visual/perceptual remediation/compensation  SLP Interventions Cueing hierarchy, Internal/external aids, Speech/Language facilitation  TR Interventions    SW/CM Interventions Discharge Planning, Psychosocial Support, Patient/Family Education   Barriers to Discharge MD  Medical stability  Nursing      PT Home environment access/layout    OT      SLP      SW       Team Discharge Planning: Destination: PT-Home ,OT- Home , SLP-Home Projected Follow-up: PT-Home health PT, OT-  Home health OT, 24 hour supervision/assistance, SLP-None, Outpatient SLP (can continue ST services if desired to achieve greater intelligibility accuarcy) Projected Equipment Needs: PT-To be determined, OT- To be determined, SLP-None recommended by SLP Equipment Details: PT-rollator, OT-  Patient/family involved in discharge planning: PT- Patient,  OT-Patient, Family member/caregiver, SLP-Patient  MD ELOS: 7-10d Medical Rehab Prognosis:  Excellent Assessment:  79 year old RH- female with history of CAD, R-CVA, PAF s/p DCCV recently, who was admitted on 01/09/20 with acute onset of dizziness, slurred speech, nausea and  vertigo while at cardiologist office. CTA head/neck showed moderate  to severe stenosis at L-VA and moderate stenosis right P2 segment. MRI brain showed acute 3 mm infarct in R-SCA and evolution of prior R-MCA infarcts.  2D echo showed EF 60-65% with biatrial dilatation, moderately elevated PAH, mild MVR and mild to moderate aortic valve sclerosis.  Stroke felt to be embolic due to A fib and Eliquis changed to pradaxa per Dr. Clydene Fake input. Her swallow function has improved and patient on regular diet. Patient with mild dysarthria with mild memory deficits, vestibular symptoms and balance deficits affecting activity tolerance as well as ADL tasks. CIR recommended due to funcitonal decline.    Now requiring 24/7 Rehab RN,MD, as well as CIR level PT, OT and SLP.  Treatment team will focus on ADLs and mobility with goals set at Mod I /Sup  See Team Conference Notes for weekly updates to the plan of care

## 2020-01-17 NOTE — Progress Notes (Signed)
Occupational Therapy Session Note  Patient Details  Name: Mary Mercado MRN: [id number] Date of Birth: October 08, 1940  Today's Date: 01/17/2020 OT Individual Time: 7017-7939 OT Individual Time Calculation (min): 60 min   OT Individual Time: 1300-1345 OT Individual Time Calculation (min): 45 min    Short Term Goals: Week 1:  OT Short Term Goal 1 (Week 1): STG=LTG 2/2 ELOS  Skilled Therapeutic Interventions/Progress Updates:  Session 1: Patient met seated EOB in agreement with OT treatment session. 0/10 pain reported at rest and with activity. Toileting/hygiene/clothing management with use of rollator and supervision A with occasional cues for locking/unlocking breaks. Patient completed hand hygiene standing at sink level and 3/3 grooming tasks with set-up assist. Patient walked from room to ADL apartment with use of rollator and no LOB or need for rest break. OT provided education on safety with use of rollator in kitchen. Patient able to transport items from refrigerator to counter with use of rollator in prep for sandwich making task. Education on energy conservation techniques in prep for BADLs and IADLs with patient able to recall 3 techniques without cues. [employer] in standing to challenge dynamic standing balance with reaching outside of BOS. Patient then engaged in there ex with use of NuStep on level 6 for 5 minutes before need for rest break. Patient returned to room in same manner as indicated above. Session concluded with patient lying supine in bed with call bell within reach and all needs met.   Session 2: Patient with request for bathing/dressing task this afternoon. Patient demonstrates UB bathing/dressing in sitting with set-up assist and LB bathing/dressing with set-up assist. Continued improvement with balance, coordination, and safety awareness with use of RW. OT updated patient/family on d/c date next Wednesday. Session concluded with patient lying supine in bed with  call bell within reach and all needs met.   Therapy Documentation Precautions:  Precautions Precautions: Fall Restrictions Weight Bearing Restrictions: No General:    Therapy/Group: Individual Therapy  Anisia Leija R Howerton-Davis 01/17/2020, 7:22 AM

## 2020-01-17 NOTE — Progress Notes (Signed)
[place] PHYSICAL MEDICINE & REHABILITATION PROGRESS NOTE   Subjective/Complaints:  No issues overnite, bowels and bladder doing ok per pt , BM recorded yesterday   ROS- neg CP, SOB, N/V/D   Objective:   No results found. Recent Labs    01/15/20 0705  WBC 7.3  HGB 15.1*  HCT 46.3*  PLT 242   Recent Labs    01/15/20 0705  NA 138  K 3.4*  CL 99  CO2 27  GLUCOSE 123*  BUN 10  CREATININE 0.62  CALCIUM 9.3    Intake/Output Summary (Last 24 hours) at 01/17/2020 0730 Last data filed at 01/16/2020 1735 Gross per 24 hour  Intake 438 ml  Output --  Net 438 ml        Physical Exam: Vital Signs Blood pressure (!) 144/86, pulse (!) 58, temperature 98 F (36.7 C), resp. rate 18, height 5' 5"  (1.651 m), weight 62.9 kg, SpO2 96 %.    General: No acute distress Mood and affect are appropriate Heart: Regular rate and rhythm no rubs murmurs or extra sounds Lungs: Clear to auscultation, breathing unlabored, no rales or wheezes Abdomen: Positive bowel sounds, soft nontender to palpation, nondistended Extremities: No clubbing, cyanosis, or edema Skin: No evidence of breakdown, no evidence of rash Neurologic: Cranial nerves II through XII intact, motor strength is 5/5 in R and 4/5 Left deltoid, bicep, tricep, grip, hip flexor, knee extensors, ankle dorsiflexor and plantar flexor Sensory exam normal sensation to light touch and proprioception in bilateral upper and lower extremities Cerebellar exam normal finger to nose to finger as well as heel to shin in bilateral upper and lower extremities Musculoskeletal: Full range of motion in all 4 extremities. No joint swelling     Assessment/Plan: 1. 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  Physiatrist is providing close team supervision and 24 hour management of active medical problems listed  below.  Physiatrist and rehab team continue to assess barriers to discharge/monitor patient progress toward functional and medical goals  Care Tool:  Bathing    Body parts bathed by patient: Right arm, Face, Left arm, Chest, Abdomen, Front perineal area, Right upper leg, Left upper leg, Right lower leg, Left lower leg   Body parts bathed by helper: Buttocks     Bathing assist Assist Level: Minimal Assistance - Patient > 75%     Upper Body Dressing/Undressing Upper body dressing   What is the patient wearing?: Pull over shirt    Upper body assist Assist Level: Supervision/Verbal cueing    Lower Body Dressing/Undressing Lower body dressing      What is the patient wearing?: Underwear/pull up, Pants     Lower body assist Assist for lower body dressing: Contact Guard/Touching assist     Toileting Toileting    Toileting assist Assist for toileting: Contact Guard/Touching assist     Transfers Chair/bed transfer  Transfers assist     Chair/bed transfer assist level: Contact Guard/Touching assist     Locomotion Ambulation   Ambulation assist      Assist level: Minimal Assistance - Patient > 75% Assistive device: Hand held assist Max distance: 20   Walk 10 feet activity   Assist     Assist level: Minimal Assistance - Patient > 75% Assistive device: Hand held assist   Walk 50 feet activity   Assist Walk 50 feet with 2 turns activity did not occur: Safety/medical concerns  Walk 150 feet activity   Assist Walk 150 feet activity did not occur: Safety/medical concerns    Assistive device: Walker-rolling    Walk 10 feet on uneven surface  activity   Assist Walk 10 feet on uneven surfaces activity did not occur: Safety/medical concerns         Wheelchair     Assist Will patient use wheelchair at discharge?: No      Wheelchair assist level: Minimal Assistance - Patient > 75% Max wheelchair distance: 150    Wheelchair  50 feet with 2 turns activity    Assist        Assist Level: Minimal Assistance - Patient > 75%   Wheelchair 150 feet activity     Assist      Assist Level: Minimal Assistance - Patient > 75%   Blood pressure (!) 144/86, pulse (!) 58, temperature 98 F (36.7 C), resp. rate 18, height 5' 5"  (1.651 m), weight 62.9 kg, SpO2 96 %.  Medical Problem List and Plan: 1.  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  Antithrombotics: -DVT/anticoagulation:  Pharmaceutical: Pradexa--family aware and agreeable of $118.80/- copay. (changed from Eliquis)             -antiplatelet therapy: N/A 3. Dysesthesias/Pain Management: Off Cymbalta due to SE.  4. Mood: LCSW to follow for evaluation and support.              -antipsychotic agents: N/A 5. Neuropsych: This patient is capable of making decisions on her own behalf. 6. Skin/Wound Care: Routine pressure relief measures.  7. Fluids/Electrolytes/Nutrition: Monitor I/O. Check lytes in am.  8. PAF: Monitor HR tid--on Flecainide and diltiazem for rate control. .  9. Hypokalemia: Will add additional supplement X 2 , still mildly low , siupplement KCL 62mq qd- recheck Met panel Monday  10.  Pre-diabetes: Hgb A1c- 5.9. Add CM restrictions to diet. Will have RD educate on HH/CM diet. 11. Dyslipidemia: Lipitor was increased to 80 mg/day 12. HTN- on diltiazem; Micardis being held.  Vitals:   01/16/20 2005 01/17/20 0447  BP: 113/61 (!) 144/86  Pulse: 63 (!) 58  Resp: 17 18  Temp: 98.3 F (36.8 C) 98 F (36.7 C)  SpO2: 97% 96%  on cardizem CD 1212mper day - some lability no dizziness  13. Initial dysphagia and urinary retention- resolved.    LOS: 3 days A FACE TO FACE EVALUATION WAS PERFORMED  AnCharlett Blake/24/2021, 7:30 AM

## 2020-01-17 NOTE — Progress Notes (Signed)
Speech Language Pathology Daily Session Note  Patient Details  Name: Mary Mercado MRN: [id number] Date of Birth: 06-08-1940  Today's Date: 01/17/2020 SLP Individual Time: 1500-1530 SLP Individual Time Calculation (min): 30 min  Short Term Goals: Week 1: SLP Short Term Goal 1 (Week 1): STG=LTG due to short ELOS  Skilled Therapeutic Interventions:   Patient seen for skilled ST intervention focusing on speech articulation. Patient required minA cues to decrease speech rate during connected speech and production of multisyllabic words. She was approximately 90% intelligible at conversational and oral reading of paragraph level material. 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 She continues to benefit from skilled SLP intervention to maximize speech production prior to discharge.  Pain Pain Assessment Pain Scale: 0-10 Pain Score: 0-No pain  Therapy/Group: Individual Therapy  Sonia Baller, MA, CCC-SLP Speech Therapy

## 2020-01-17 NOTE — Progress Notes (Signed)
Physical Therapy Session Note  Patient Details  Name: Mary Mercado MRN: [id number] Date of Birth: 1940-08-23  Today's Date: 01/17/2020 PT Individual Time: 0800-0909 PT Individual Time Calculation (min): 69 min   Short Term Goals: Week 1:  PT Short Term Goal 1 (Week 1): STG=LTG due ELOS  Skilled Therapeutic Interventions/Progress Updates:   Pt received supine in bed and agreeable to PT. Supine>sit transfer without assist or cues from PT. Pt able to don shoes and socks EOB without assist from PT. Stand pivot transfer to [facility] with rollator and supervision assist and AD parts management.   PT instructed pt in [place] mobility through hall for UE endurance and coordination x 117f. Cues for safety in turns and symmetry RvsL.   Gait training with rollator x [id number] [id number] supervision assist. Cues from PT for gaze stabilization in turns to reduce vertigo in turns. Mild increase to 6/10 intermittently when noted not utilize gaze stabilization strategies.   Dynamic balance training instructed by PT to perform static stance on airex pad with gaze fixed 2x 30sec. And while perfoming low and moderate difficulty peg board puzles. Pt reports mild increase from 4/10 to 7/10 with vertigo s/s on first bout of peg board solving, and no increase from 4/10 on second.   Patient demonstrates increased fall risk as noted by score of   42/56 on Berg Balance Scale.  (<36= high risk for falls, close to 100%; 37-45 significant >80%; 46-51 moderate >50%; 52-55 lower >25%)  Pt returned to room and performed stand pivot transfer to bed with supervision assist and no AD. Sit>supine completed without assist and left supine in bed with call bell in reach and all needs met.            Therapy Documentation Precautions:  Precautions Precautions: Fall Restrictions Weight Bearing Restrictions: No   Pain: Pain Assessment Pain Scale: 0-10 Pain Score: 0-No pain    Balance: Standardized  Balance Assessment Standardized Balance Assessment: Berg Balance Test Berg Balance Test Sit to Stand: Able to stand without using hands and stabilize independently Standing Unsupported: Able to stand safely 2 minutes Sitting with Back Unsupported but Feet Supported on Floor or Stool: Able to sit safely and securely 2 minutes Stand to Sit: Sits safely with minimal use of hands Transfers: Able to transfer safely, minor use of hands Standing Unsupported with Eyes Closed: Able to stand 10 seconds with supervision Standing Ubsupported with Feet Together: Able to place feet together independently and stand for 1 minute with supervision From Standing, Reach Forward with Outstretched Arm: Can reach forward >12 cm safely (5") From Standing Position, Pick up Object from Floor: Able to pick up shoe, needs supervision From Standing Position, Turn to Look Behind Over each Shoulder: Turn sideways only but maintains balance Turn 360 Degrees: Needs close supervision or verbal cueing Standing Unsupported, Alternately Place Feet on Step/Stool: Able to complete >2 steps/needs minimal assist Standing Unsupported, One Foot in Front: Able to take small step independently and hold 30 seconds Standing on One Leg: Able to lift leg independently and hold > 10 seconds Total Score: 42    Therapy/Group: Individual Therapy  AustLorie Phenix4/2021, 9:12 AM

## 2020-01-18 ENCOUNTER — Inpatient Hospital Stay (HOSPITAL_COMMUNITY): Payer: Medicare Other

## 2020-01-18 ENCOUNTER — Inpatient Hospital Stay (HOSPITAL_COMMUNITY): Payer: Medicare Other | Admitting: Physical Therapy

## 2020-01-18 ENCOUNTER — Inpatient Hospital Stay (HOSPITAL_COMMUNITY): Payer: Medicare Other | Admitting: Occupational Therapy

## 2020-01-18 ENCOUNTER — Encounter (HOSPITAL_COMMUNITY): Payer: Medicare Other | Admitting: Occupational Therapy

## 2020-01-18 DIAGNOSIS — I639 Cerebral infarction, unspecified: Secondary | ICD-10-CM

## 2020-01-18 MED ORDER — INFLUENZA VAC A&B SA ADJ QUAD 0.5 ML IM PRSY
0.5000 mL | PREFILLED_SYRINGE | INTRAMUSCULAR | Status: DC
  Filled 2020-01-18: qty 0.5

## 2020-01-18 NOTE — Progress Notes (Signed)
Occupational Therapy Session Note  Patient Details  Name: Mary Mercado MRN: [id number] Date of Birth: 10-Mar-1941  Today's Date: 01/18/2020 OT Group Time: 1100-1140 OT Group Time Calculation (min): 40 min  Skilled Therapeutic Interventions/Progress Updates:    Pt engaged in therapeutic w/c level dance group focusing on patient choice, UE/LE strengthening, salience, activity tolerance, and social participation. Pt was guided through various dance-based exercises involving UEs/LEs and trunk. All music was selected by group members. Emphasis placed on general strengthening and NMR. Pt arrived to group via SLP handoff, spouse also in attendance. She participated while seated and took rest breaks as needed, spouse following exercises with her. Pt declined standing when given opportunities. Spouse returned pt to room before end of group due to increased dizziness. Pt reported dizziness absolved once she returned to bed to rest after. 20 minutes of group time missed.    Therapy Documentation Precautions:  Precautions Precautions: Fall Restrictions Weight Bearing Restrictions: No Pain: no s/s pain during tx Pain Assessment Pain Score: 0-No pain ADL: ADL Eating: Set up Where Assessed-Eating: Chair Grooming: Setup Where Assessed-Grooming: Sitting at sink Upper Body Bathing: Supervision/safety Where Assessed-Upper Body Bathing: Shower Lower Body Bathing: Minimal assistance Where Assessed-Lower Body Bathing: Shower Upper Body Dressing: Setup Where Assessed-Upper Body Dressing: Chair Lower Body Dressing: Minimal assistance Where Assessed-Lower Body Dressing: Chair Toileting: Minimal assistance Where Assessed-Toileting: Glass blower/designer: Therapist, music Method: Psychologist, educational: Curator Method: Stand pivot      Therapy/Group: Group Therapy  Jeryl Wilbourn A Fred Franzen 01/18/2020, 12:44 PM

## 2020-01-18 NOTE — Progress Notes (Signed)
Speech Language Pathology Daily Session Note  Patient Details  Name: Mary Mercado MRN: [id number] Date of Birth: 14-Feb-1941  Today's Date: 01/18/2020 SLP Individual Time: 1032-1100 SLP Individual Time Calculation (min): 28 min  Short Term Goals: Week 1: SLP Short Term Goal 1 (Week 1): STG=LTG due to short ELOS  Skilled Therapeutic Interventions:Skilled ST services focused on speech skills. SLP communicated with pt pertaining to swallowing difficulties mentioned in previous ST session. Pt stated only difficulty with swallowing pills, not solids, but following puree after pill consumed whole with thin was helpful. Pt demonstrated recall of speech strategies mod I. SLP facilitated use of speech strategies in the production of multi-syllable words and creating sentence with the words. Pt demonstrate mod I ability to produce 3 syllable words and required supervision A verbal cues for precise articulation with 4 syllable words. Pt demonstrated increase carryover of strategies in structured tasks with supervision A verbal cues fade to mod I, however in unstructred conversation requires min A fade to supervision A verbal cues to slow rate and pause between words. SLP assisted pt to bathroom and then pt's husband took pt out of room in [facility] to OT appointment. SLP recommends to continue skilled services.     Pain Pain Assessment Pain Scale: Faces Pain Score: 0-No pain  Therapy/Group: Individual Therapy  Rastus Borton  [facility] 01/18/2020, 12:22 PM

## 2020-01-18 NOTE — Progress Notes (Signed)
[place] PHYSICAL MEDICINE & REHABILITATION PROGRESS NOTE   Subjective/Complaints:  Sitting eob. Just finished breakfast. No new complaints  ROS: Patient denies fever, rash, sore throat, blurred vision, nausea, vomiting, diarrhea, cough, shortness of breath or chest pain, joint or back pain, headache, or mood change.     Objective:   No results found. No results for input(s): WBC, HGB, HCT, PLT in the last 72 hours. No results for input(s): NA, K, CL, CO2, GLUCOSE, BUN, CREATININE, CALCIUM in the last 72 hours. No intake or output data in the 24 hours ending 01/18/20 1208      Physical Exam: Vital Signs Blood pressure 139/82, pulse 68, temperature 98.3 F (36.8 C), resp. rate 16, height 5' 5" (1.651 m), weight 62.9 kg, SpO2 98 %.    Constitutional: No distress . Vital signs reviewed. HEENT: EOMI, oral membranes moist Neck: supple Cardiovascular: RRR without murmur. No JVD    Respiratory/Chest: CTA Bilaterally without wheezes or rales. Normal effort    GI/Abdomen: BS +, non-tender, non-distended Ext: no clubbing, cyanosis, or edema Psych: pleasant and cooperative Skin: No evidence of breakdown, no evidence of rash Neurologic: sl dysarthria.  Cranial nerves II through XII intact, motor strength is 5/5 in R and 4/5 Left deltoid, bicep, tricep, grip, hip flexor, knee extensors, ankle dorsiflexor and plantar flexor Sensory exam normal sensation to light touch and proprioception in bilateral upper and lower extremities Cerebellar exam normal finger to nose to finger as well as heel to shin in bilateral upper and lower extremities Musculoskeletal: Full range of motion in all 4 extremities. No joint swelling     Assessment/Plan: 1. 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  Physiatrist is providing close team supervision and 24 hour management of active  medical problems listed below.  Physiatrist and rehab team continue to assess barriers to discharge/monitor patient progress toward functional and medical goals  Care Tool:  Bathing    Body parts bathed by patient: Right arm, Face, Left arm, Chest, Abdomen, Front perineal area, Right upper leg, Left upper leg, Right lower leg, Left lower leg, Buttocks   Body parts bathed by helper: Buttocks     Bathing assist Assist Level: Supervision/Verbal cueing     Upper Body Dressing/Undressing Upper body dressing   What is the patient wearing?: Pull over shirt    Upper body assist Assist Level: Supervision/Verbal cueing    Lower Body Dressing/Undressing Lower body dressing      What is the patient wearing?: Underwear/pull up, Pants     Lower body assist Assist for lower body dressing: Supervision/Verbal cueing     Toileting Toileting    Toileting assist Assist for toileting: Supervision/Verbal cueing     Transfers Chair/bed transfer  Transfers assist     Chair/bed transfer assist level: Supervision/Verbal cueing     Locomotion Ambulation   Ambulation assist      Assist level: Supervision/Verbal cueing Assistive device: Walker-rolling Max distance: 100'   Walk 10 feet activity   Assist     Assist level: Minimal Assistance - Patient > 75% Assistive device: Hand held assist   Walk 50 feet activity   Assist Walk 50 feet with 2 turns activity did not occur: Safety/medical concerns         Walk 150 feet activity   Assist Walk 150 feet activity did not occur: Safety/medical concerns    Assistive device: Walker-rolling  Walk 10 feet on uneven surface  activity   Assist Walk 10 feet on uneven surfaces activity did not occur: Safety/medical concerns         Wheelchair     Assist Will patient use wheelchair at discharge?: No      Wheelchair assist level: Minimal Assistance - Patient > 75% Max wheelchair distance: 150     Wheelchair 50 feet with 2 turns activity    Assist        Assist Level: Minimal Assistance - Patient > 75%   Wheelchair 150 feet activity     Assist      Assist Level: Minimal Assistance - Patient > 75%   Blood pressure 139/82, pulse 68, temperature 98.3 F (36.8 C), resp. rate 16, height 5' 5" (1.651 m), weight 62.9 kg, SpO2 98 %.  Medical Problem List and Plan: 1.  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  Antithrombotics: -DVT/anticoagulation:  Pharmaceutical: Pradexa--family aware and agreeable of $118.80/- copay. (changed from Eliquis)             -antiplatelet therapy: N/A 3. Dysesthesias/Pain Management: Off Cymbalta due to SE.  4. Mood: LCSW to follow for evaluation and support.              -antipsychotic agents: N/A 5. Neuropsych: This patient is capable of making decisions on her own behalf. 6. Skin/Wound Care: Routine pressure relief measures.  7. Fluids/Electrolytes/Nutrition: Monitor I/O. Check lytes in am.  8. PAF: Monitor HR tid--on Flecainide and diltiazem for rate control. .  9. Hypokalemia: added additional supplement X 2 , still mildly low , siupplement KCL 16mq qd- recheck Met panel Monday  10.  Pre-diabetes: Hgb A1c- 5.9. Add CM restrictions to diet. Will have RD educate on HH/CM diet. 11. Dyslipidemia: Lipitor was increased to 80 mg/day 12. HTN- on diltiazem; Micardis being held.  Vitals:   01/17/20 1940 01/18/20 0539  BP: 134/71 139/82  Pulse: 71 68  Resp: 18 16  Temp: 97.9 F (36.6 C) 98.3 F (36.8 C)  SpO2: 100% 98%  on cardizem CD 1242mper day - bp's improved 13. Initial dysphagia and urinary retention- resolved.    LOS: 4 days A FACE TO FACE EVALUATION WAS PERFORMED  ZaMeredith Staggers/25/2021, 12:08 PM

## 2020-01-18 NOTE — Progress Notes (Signed)
Occupational Therapy Session Note  Patient Details  Name: Mary Mercado MRN: [id number] Date of Birth: 02/16/1941  Today's Date: 01/18/2020 OT Individual Time: 0263-7858 OT Individual Time Calculation (min): 77 min    Short Term Goals: Week 1:  OT Short Term Goal 1 (Week 1): STG=LTG 2/2 ELOS  Skilled Therapeutic Interventions/Progress Updates:    Pt sitting EOB to start session with nursing in for medication administration.  She was then agreeable to complete shower and dressing.  [name] ambulated in the room with use of the rollator for gathering her clothing prior to shower at supervision level and then transferred to the shower seat.  She was able to remove all dirty clothing, complete shower sit to stand, and complete dressing at the sink with supervision.  No reports of dizziness with sit to stand and functional mobility during selfcare tasks.  Next, she completed grooming tasks of blow drying her hair, brushing it, and applying face cream with supervision.  She then ambulated with the rollator down to the dayroom at supervision and transferred to the [facility].  Therapist had her work on visual tracking, gaze stabilization, and head and eye movement exercises.  She reported increased dizziness at a 4-5/10 with gaze stabilization as well as tracking with head and eye movements together horizontally.  Issued pt two sticky notes to work on these exercises on her own in the room.  Took one sticky note and taped it to the wall for distance gaze stabilization and told her to use the other one in her hand to complete the exercises at least 2 times a day for 1 minute each.  She then returned to her room with the rollator at supervision level and transferred to the bed to rest before next session.  Call button and phone in reach with safety alarm in place.     Therapy Documentation Precautions:  Precautions Precautions: Fall Restrictions Weight Bearing Restrictions: No  Pain: Pain  Assessment Pain Scale: Faces Pain Score: 0-No pain ADL: See Care Tool Section for some details of mobility and selfcare  Therapy/Group: Individual Therapy  Wally Behan OTR/L 01/18/2020, 9:18 AM

## 2020-01-19 NOTE — Progress Notes (Signed)
Physical Therapy Session Note  Patient Details  Name: Mary Mercado MRN: [id number] Date of Birth: 1941/01/13  Today's Date: 01/18/2020 PT Individual Time:1345-1440   55 min   Short Term Goals: Week 1:  PT Short Term Goal 1 (Week 1): STG=LTG due ELOS  Skilled Therapeutic Interventions/Progress Updates:   Pt received supine in bed and agreeable to PT. Supine>sit transfer without assist or cues. Pt performed stand pivot transfer to [facility] with rollator and supervision assist with cues for AD management.   WC mobility through hall of rehab unit with supervision assist x 152f; cues for symmetry to prevent veer to the L.   Pt transported to entrance of [facility]ait training with rollator over cement sidewalk [id number] supervision assist and cues for safety in turns and preparation for tranfers. Pt then performed gait training without AD 2 x [id number] close supervision assist. Noted to have decreased step length and narrow BOS without AD.   Patient returned to room and requesting to use restroom. Toilet transfer with Rollator and supervision assist for safety and threshold management. Pt then left sitting in WC with call bell in reach and all needs met.          Therapy Documentation Precautions:  Precautions Precautions: Fall Restrictions Weight Bearing Restrictions: No Pain: denies   Therapy/Group: Individual Therapy  AusLorie Phenix26/2021, 5:24 AM

## 2020-01-20 ENCOUNTER — Inpatient Hospital Stay (HOSPITAL_COMMUNITY): Payer: Medicare Other | Admitting: Occupational Therapy

## 2020-01-20 ENCOUNTER — Inpatient Hospital Stay (HOSPITAL_COMMUNITY): Payer: Medicare Other | Admitting: Physical Therapy

## 2020-01-20 ENCOUNTER — Inpatient Hospital Stay (HOSPITAL_COMMUNITY): Payer: Medicare Other | Admitting: Speech Pathology

## 2020-01-20 LAB — BASIC METABOLIC PANEL
Anion gap: 6 (ref 5–15)
BUN: 9 mg/dL (ref 8–23)
CO2: 30 mmol/L (ref 22–32)
Calcium: 9.4 mg/dL (ref 8.9–10.3)
Chloride: 103 mmol/L (ref 98–111)
Creatinine, Ser: 0.63 mg/dL (ref 0.44–1.00)
GFR calc Af Amer: >60 mL/min (ref >60)
GFR calc non Af Amer: >60 mL/min (ref >60)
Glucose, Bld: 119 mg/dL — ABNORMAL HIGH (ref 70–99)
Potassium: 4.1 mmol/L (ref 3.5–5.1)
Sodium: 139 mmol/L (ref 135–145)

## 2020-01-20 LAB — CBC
HCT: 43.7 % (ref 36.0–46.0)
Hemoglobin: 14 g/dL (ref 12.0–15.0)
MCH: 29.6 pg (ref 26.0–34.0)
MCHC: 32 g/dL (ref 30.0–36.0)
MCV: 92.4 fL (ref 80.0–100.0)
Platelets: 247 10*3/uL (ref 150–400)
RBC: 4.73 MIL/uL (ref 3.87–5.11)
RDW: 12.5 % (ref 11.5–15.5)
WBC: 5.4 10*3/uL (ref 4.0–10.5)
nRBC: 0 % (ref 0.0–0.2)

## 2020-01-20 NOTE — Progress Notes (Signed)
Occupational Therapy Session Note  Patient Details  Name: Mary Mercado MRN: [id number] Date of Birth: 06-09-1940  Today's Date: 01/20/2020 OT Individual Time: 6606-0045 OT Individual Time Calculation (min): 73 min   Short Term Goals: Week 1:  OT Short Term Goal 1 (Week 1): STG=LTG 2/2 ELOS  Skilled Therapeutic Interventions/Progress Updates:    Pt greeted in bed with no c/o pain. ADL needs met and she had already practiced kitchen mobility in previous OT session and felt comfortable with meal prep. She wanted to go outside. Tx focus placed on dynamic balance with pt ambulating using rollator to the outdoor patio at Mod I level. She transferred to multiple benches exhibiting good safety awareness with locking rollator brakes. We discussed CVA prevention strategies pertaining to diet, exercise, and stress reduction. Had her pathfind way back to the unit to work on functional cognition, pt initiating taking rest breaks using the rollator seat as needed. Once she returned to the room pt completed toilet transfer + toileting at Mod I level. She returned to bed and doffed shoes, left her with all needs within reach. [name] aromatherapy added to pillowcase to promote relaxation during rest.   Pt reported having no dizziness during tx  Therapy Documentation Precautions:  Precautions Precautions: Fall Restrictions Weight Bearing Restrictions: No Vital Signs: Therapy Vitals Temp: 97.7 F (36.5 C) Pulse Rate: 81 Resp: 18 BP: 130/83 Patient Position (if appropriate): Sitting Oxygen Therapy SpO2: 100 % O2 Device: Room Air ADL: ADL Eating: Set up Where Assessed-Eating: Chair Grooming: Setup Where Assessed-Grooming: Sitting at sink Upper Body Bathing: Supervision/safety Where Assessed-Upper Body Bathing: Shower Lower Body Bathing: Minimal assistance Where Assessed-Lower Body Bathing: Shower Upper Body Dressing: Setup Where Assessed-Upper Body Dressing: Chair Lower Body  Dressing: Minimal assistance Where Assessed-Lower Body Dressing: Chair Toileting: Minimal assistance Where Assessed-Toileting: Glass blower/designer: Therapist, music Method: Psychologist, educational: Curator Method: Stand pivot      Therapy/Group: Individual Therapy  Mikhai Bienvenue A Gerren Hoffmeier 01/20/2020, 3:40 PM

## 2020-01-20 NOTE — Progress Notes (Signed)
Patient ID: Mary Mercado, female   DOB: 05-09-1940, 79 y.o.   MRN: [id number]   Pt referral faxed to [facility] OP on 01/17/2020.

## 2020-01-20 NOTE — Progress Notes (Signed)
Physical Therapy Session Note  Patient Details  Name: Mary Mercado MRN: [id number] Date of Birth: 09-05-1940  Today's Date: 01/20/2020 PT Individual Time: 1100-1200 PT Individual Time Calculation (min): 60 min   Short Term Goals: Week 1:  PT Short Term Goal 1 (Week 1): STG=LTG due ELOS  Skilled Therapeutic Interventions/Progress Updates: Pt presented at EOB with husband present agreeable to therapy. Pt denies pain during session, but rest breaks provided as needed. Session focused on functional activities and gait in preparation for d/c. Performed stand pivot without AD CGA to w/c and transported to ortho gym for energy conservation. Pt performed car transfer to SUV height (pt has crossover SUV) with supervision. Pt also ambulated up/down ramp with rollator and CGA. Pt then transported to rehab gym and participated in stairs ascending/descending x 8 steps with close S and use of B rails. Pt then ambulated to mat and participated in STS 2 x 5 for BLE strengthening. Pt also participated in 4 square activities stepping over hockey sticks. Pt required minA for stepping backwards when leading with LLE but CGA for other directions. Pt then ambualted x 29f x  2 without AD and CGA to wash hands at sink. Pt noted to have decreased arm swing and decreased foot clearance when ambulating without AD. Pt then participated in side stepping and backwards walking in parallel bars. Pt required min cues to increase L step length when ambulating backwards. When walking back to mat for seated rest required verbal cues for increase relaxed arms/increasing arm swing. Pt then indicated need for toilet, ambulated back to room with rollator and supervision. Performed toilet transfers with supervision(+void). Pt then ambulated to sink and performed hand hygiene with supervision and ambulated to recliner. Pt left in recliner at end of session with seat alarm placed, call bell within reach and needs met.        Therapy Documentation Precautions:  Precautions Precautions: Fall Restrictions Weight Bearing Restrictions: No General:   Vital Signs: Therapy Vitals Temp: 97.7 F (36.5 C) Pulse Rate: 81 Resp: 18 BP: 130/83 Patient Position (if appropriate): Sitting Oxygen Therapy SpO2: 100 % O2 Device: Room Air Pain: Pain Assessment Pain Scale: 0-10 Pain Score: 0-No pain Mobility:   Locomotion :    Trunk/Postural Assessment :    Balance:   Exercises:   Other Treatments:      Therapy/Group: Individual Therapy  Noga Fogg 01/20/2020, 4:35 PM

## 2020-01-20 NOTE — Progress Notes (Addendum)
Occupational Therapy Session Note  Patient Details  Name: Mary Mercado MRN: [id number] Date of Birth: 1941-04-13  Today's Date: 01/20/2020 OT Individual Time: 0092-3300 OT Individual Time Calculation (min): 45 min    Short Term Goals: Week 1:  OT Short Term Goal 1 (Week 1): STG=LTG 2/2 ELOS   Skilled Therapeutic Interventions/Progress Updates:    Pt greeted at time of session sitting up in recliner agreeable to OT, no c/o pain, wanting to take a shower. Ambulated throughout room with Supervision with RW and collected clothing for after shower with Mod I, pt able to gather clothes from various drawers with bending/reaching and opening/closing drawers. All ADL transfers with Mod I including to/from toilet and shower bench. Toileting 3/3 tasks with Mod I, therapist present for safety and pt performed UB/LB bathing with Mod I able to bathe all aspects with figure four for BLEs. Dried off without assistance and got dressed from toilet level with Mod I for shirt, bra, pants, and underwear. Set up for socks and shoes. Set up at sink level and performed oral hygiene Indep, styled and blow dry hair with set up. Ambulated short distance to bed level and sit to supine Mod I, alarm on call bell in reach. Pt very pleasant and cooperative!  Therapy Documentation Precautions:  Precautions Precautions: Fall Restrictions Weight Bearing Restrictions: No     Therapy/Group: Individual Therapy  Viona Gilmore 01/20/2020, 10:12 AM

## 2020-01-20 NOTE — Progress Notes (Signed)
[facility] PHYSICAL MEDICINE & REHABILITATION PROGRESS NOTE   Subjective/Complaints:  Sitting eob. Just finished breakfast. No new complaints  ROS: Patient denies CP, SOB, N/V/D  Objective:   No results found. Recent Labs    01/20/20 0704  WBC 5.4  HGB 14.0  HCT 43.7  PLT 247   No results for input(s): NA, K, CL, CO2, GLUCOSE, BUN, CREATININE, CALCIUM in the last 72 hours.  Intake/Output Summary (Last 24 hours) at 01/20/2020 0736 Last data filed at 01/19/2020 2300 Gross per 24 hour  Intake 638 ml  Output --  Net 638 ml        Physical Exam: Vital Signs Blood pressure 129/77, pulse 65, temperature (!) 97.5 F (36.4 C), temperature source Oral, resp. rate 20, height 5' 5"  (1.651 m), weight 62.9 kg, SpO2 98 %.    Constitutional: No distress . Vital signs reviewed. HEENT: EOMI, oral membranes moist Neck: supple Cardiovascular: RRR without murmur. No JVD    Respiratory/Chest: CTA Bilaterally without wheezes or rales. Normal effort    GI/Abdomen: BS +, non-tender, non-distended Ext: no clubbing, cyanosis, or edema Psych: pleasant and cooperative Skin: No evidence of breakdown, no evidence of rash Neurologic: sl dysarthria.  Cranial nerves II through XII intact, motor strength is 5/5 in R and 4/5 Left deltoid, bicep, tricep, grip, hip flexor, knee extensors, ankle dorsiflexor and plantar flexor Sensory exam normal sensation to light touch and proprioception in bilateral upper and lower extremities Cerebellar exam normal finger to nose to finger as well as heel to shin in bilateral upper and lower extremities Musculoskeletal: Full range of motion in all 4 extremities. No joint swelling     Assessment/Plan: 1. 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  Physiatrist is providing close team supervision and 24 hour management of active medical problems  listed below.  Physiatrist and rehab team continue to assess barriers to discharge/monitor patient progress toward functional and medical goals  Care Tool:  Bathing    Body parts bathed by patient: Right arm, Face, Left arm, Chest, Abdomen, Front perineal area, Right upper leg, Left upper leg, Right lower leg, Left lower leg, Buttocks   Body parts bathed by helper: Buttocks     Bathing assist Assist Level: Supervision/Verbal cueing     Upper Body Dressing/Undressing Upper body dressing   What is the patient wearing?: Pull over shirt    Upper body assist Assist Level: Supervision/Verbal cueing    Lower Body Dressing/Undressing Lower body dressing      What is the patient wearing?: Underwear/pull up, Pants     Lower body assist Assist for lower body dressing: Supervision/Verbal cueing     Toileting Toileting    Toileting assist Assist for toileting: Supervision/Verbal cueing     Transfers Chair/bed transfer  Transfers assist     Chair/bed transfer assist level: Supervision/Verbal cueing     Locomotion Ambulation   Ambulation assist      Assist level: Supervision/Verbal cueing Assistive device: Walker-rolling Max distance: 100'   Walk 10 feet activity   Assist     Assist level: Minimal Assistance - Patient > 75% Assistive device: Hand held assist   Walk 50 feet activity   Assist Walk 50 feet with 2 turns activity did not occur: Safety/medical concerns         Walk 150 feet activity   Assist Walk 150 feet activity did not occur: Safety/medical concerns  Assistive device: Walker-rolling    Walk 10 feet on uneven surface  activity   Assist Walk 10 feet on uneven surfaces activity did not occur: Safety/medical concerns         Wheelchair     Assist Will patient use wheelchair at discharge?: No      Wheelchair assist level: Minimal Assistance - Patient > 75% Max wheelchair distance: 150    Wheelchair 50 feet with 2  turns activity    Assist        Assist Level: Minimal Assistance - Patient > 75%   Wheelchair 150 feet activity     Assist      Assist Level: Minimal Assistance - Patient > 75%   Blood pressure 129/77, pulse 65, temperature (!) 97.5 F (36.4 C), temperature source Oral, resp. rate 20, height 5' 5"  (1.651 m), weight 62.9 kg, SpO2 98 %.  Medical Problem List and Plan: 1.  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  Antithrombotics: -DVT/anticoagulation:  Pharmaceutical: Pradexa--family aware and agreeable of $118.80/- copay. (changed from Eliquis)  CBC normal 9/27             -antiplatelet therapy: N/A 3. Dysesthesias/Pain Management: Off Cymbalta due to SE.  4. Mood: LCSW to follow for evaluation and support.              -antipsychotic agents: N/A 5. Neuropsych: This patient is capable of making decisions on her own behalf. 6. Skin/Wound Care: Routine pressure relief measures.  7. Fluids/Electrolytes/Nutrition: Monitor I/O. Check lytes in am.  8. PAF: Monitor HR tid--on Flecainide and diltiazem for rate control. .  9. Hypokalemia: added additional supplement X 2 , still mildly low , siupplement KCL 31mq qd- recheck Met panel Monday  10.  Pre-diabetes: Hgb A1c- 5.9. Add CM restrictions to diet. Will have RD educate on HH/CM diet. 11. Dyslipidemia: Lipitor was increased to 80 mg/day 12. HTN- on diltiazem; Micardis being held.  Vitals:   01/19/20 1931 01/20/20 0536  BP: 97/63 129/77  Pulse: 65 65  Resp: 18 20  Temp: 98.1 F (36.7 C) (!) 97.5 F (36.4 C)  SpO2: 97% 98%  on cardizem CD 1251mper day - bp's improved 13. Initial dysphagia and urinary retention- resolved.    LOS: 6 days A FACE TO FACE EVALUATION WAS PERFORMED  AnCharlett Blake/27/2021, 7:36 AM

## 2020-01-20 NOTE — Progress Notes (Signed)
Speech Language Pathology Daily Session Note  Patient Details  Name: Raeleigh Guinn MRN: [id number] Date of Birth: 1941/02/06  Today's Date: 01/20/2020 SLP Individual Time: 1300-1325 SLP Individual Time Calculation (min): 25 min  Short Term Goals: Week 1: SLP Short Term Goal 1 (Week 1): STG=LTG due to short ELOS  Skilled Therapeutic Interventions: Pt was seen for skilled ST targeting speech goals. Pt's husband was present at bedside. Pt was 95% intelligible in conversation with this unfamiliar listener. She independently recalled speech intelligibility strategies and self corrected verbal errors with multisyllabic words X2. Feedback provided regarding excellent use of implementing slow rate and overarticultion. Also discussed impact of fatigue and different environmental settings on intelligibility. Pt expressed she is glad her speech is intelligible but still has difficulty implementing slow rate in unstructured conversational situations at times. Pt left sitting in recliner with needs within reach and husband still present at bedside. Continue per current plan of care.           Pain Pain Assessment Pain Scale: 0-10 Pain Score: 0-No pain  Therapy/Group: Individual Therapy  Arbutus Leas 01/20/2020, 3:13 PM

## 2020-01-20 NOTE — Progress Notes (Signed)
Patient ID: Mary Mercado, female   DOB: 1940-12-19, 79 y.o.   MRN: [id number]   Rollater ordered through [place].

## 2020-01-21 ENCOUNTER — Inpatient Hospital Stay (HOSPITAL_COMMUNITY): Payer: Medicare Other | Admitting: Speech Pathology

## 2020-01-21 ENCOUNTER — Inpatient Hospital Stay (HOSPITAL_COMMUNITY): Payer: Medicare Other

## 2020-01-21 ENCOUNTER — Inpatient Hospital Stay (HOSPITAL_COMMUNITY): Payer: Medicare Other | Admitting: Physical Therapy

## 2020-01-21 NOTE — Progress Notes (Signed)
Physical Therapy Session Note  Patient Details  Name: Mary Mercado MRN: [id number] Date of Birth: 05-12-1940  Today's Date: 01/21/2020 PT Individual Time: 0900-1000 and 1415-1525  PT Individual Time Calculation (min): 60 min and 70 min  Short Term Goals: Week 1:  PT Short Term Goal 1 (Week 1): STG=LTG due ELOS  Skilled Therapeutic Interventions/Progress Updates: Pt presented in recliner agreeable to therapy. Pt denies pain throughout session. Upon PTA's arrival pt's rollator also arrived. PTA changed bag to personal rollator and adjusted height appropriately. Pt then requesting to use bathroom prior to leaving room. Pt performed ambulatory transfer to toilet with rollator and distant supervision (+void). Pt then ambulated to sink for hand hygiene with supervision. Pt ambulated to day room with rollator and supervision and participated in [employer] and TUG. Rest breaks provided as needed due to fatigue. Pt with improved score of 48/56 from 42/56. Pt also with improved score of TUG from 69 to 26.8 however incorporated use of rollator. Upon completion of tests pt ambulated back to room and requested to use toilet prior to returning to recliner. Pt was able to perform all tasks with distant supervision. Pt returned to recliner at end of session and left with call bell within reach and needs met.   Tx2: Pt presented in bed agreeable to therapy. Pt denies pain throughout session but rest breaks provided as needed. Session focused on ambulation in community environment. Pt transported to [facility] entrance for energy conservation. Pt ambulated distances of 150-221f with rollator and supervision assist. During rest breaks pt and husband educated on energy conservation. Pt was also able to demonstrate safe use of rollator appropriately using breaks and was able to demonstrate lock and sit without cues. Pt also ambulated back into atrium and through gift shop with supervision overall. Pt then  ambulated back to unit taking rest breaks as needed and ambulated to rehab gym. Pt participated in ascending/descending x 12 steps with B rail and supervision overall. Pt also participated in use of rebounder on level tile for dynamic balance and demonstrating good safety overall. Pt ambulated back to room at end of session with supervision and used bathroom prior to PTA leaving room with distant supervision. Pt returned to bed at end of session and left with bed alarm on, call bell within reach and needs met.      Therapy Documentation Precautions:  Precautions Precautions: Fall Restrictions Weight Bearing Restrictions: No General:   Vital Signs: Therapy Vitals Temp: 97.9 F (36.6 C) Pulse Rate: 64 Resp: 16 BP: 126/72 Patient Position (if appropriate): Lying Oxygen Therapy SpO2: 100 % O2 Device: Room Air Pain: Pain Assessment Pain Scale: 0-10 Pain Score: 0-No pain Mobility:   Locomotion : Gait Ambulation: Yes Gait Assistance: Supervision/Verbal cueing Gait Distance (Feet): 150 Feet Assistive device: Rollator Gait Gait: Yes Gait Pattern: Impaired Gait Pattern: Narrow base of support;Poor foot clearance - left;Poor foot clearance - right Gait velocity: decreased Stairs / Additional Locomotion Stairs: Yes Stairs Assistance: Contact Guard/Touching assist Stair Management Technique: Two rails Number of Stairs: 12 Height of Stairs: 6 Ramp: Contact Guard/touching assist Wheelchair Mobility Wheelchair Mobility: No  Trunk/Postural Assessment :    Balance: Balance Balance Assessed: Yes Standardized Balance Assessment Standardized Balance Assessment: Berg Balance Test Berg Balance Test Sit to Stand: Able to stand without using hands and stabilize independently Standing Unsupported: Able to stand safely 2 minutes Sitting with Back Unsupported but Feet Supported on Floor or Stool: Able to sit safely and securely 2 minutes  Stand to Sit: Sits safely with minimal use of  hands Transfers: Able to transfer safely, minor use of hands Standing Unsupported with Eyes Closed: Able to stand 10 seconds safely Standing Ubsupported with Feet Together: Able to place feet together independently and stand for 1 minute with supervision From Standing, Reach Forward with Outstretched Arm: Can reach forward >12 cm safely (5") From Standing Position, Pick up Object from Floor: Able to pick up shoe, needs supervision From Standing Position, Turn to Look Behind Over each Shoulder: Looks behind one side only/other side shows less weight shift Turn 360 Degrees: Able to turn 360 degrees safely but slowly Standing Unsupported, Alternately Place Feet on Step/Stool: Able to stand independently and complete 8 steps >20 seconds Standing Unsupported, One Foot in Front: Able to plae foot ahead of the other independently and hold 30 seconds Standing on One Leg: Able to lift leg independently and hold > 10 seconds Total Score: 48 Timed Up and Go Test TUG: Normal TUG Normal TUG (seconds): 26.8 Static Sitting Balance Static Sitting - Balance Support: No upper extremity supported Static Sitting - Level of Assistance: 6: Modified independent (Device/Increase time) Dynamic Sitting Balance Dynamic Sitting - Balance Support: No upper extremity supported Dynamic Sitting - Level of Assistance: 6: Modified independent (Device/Increase time) Static Standing Balance Static Standing - Balance Support: Bilateral upper extremity supported Static Standing - Level of Assistance: 6: Modified independent (Device/Increase time) Dynamic Standing Balance Dynamic Standing - Balance Support: Bilateral upper extremity supported Dynamic Standing - Level of Assistance: 6: Modified independent (Device/Increase time) Exercises:   Other Treatments:      Therapy/Group: Individual Therapy  Mistee Soliman 01/21/2020, 3:39 PM

## 2020-01-21 NOTE — Progress Notes (Signed)
Speech Language Pathology Discharge Summary  Patient Details  Name: Mary Mercado MRN: [id number] Date of Birth: 03-21-1941  Today's Date: 01/21/2020 SLP Individual Time: 1127-1203 SLP Individual Time Calculation (min): 36 min   Skilled Therapeutic Interventions:  Pt was seen for skilled ST targeting speech goals. Pt was 95% intelligible at conversation level as well as during sentence level barrier speech tasks even with mild background noise (TV on low volume). Pt still requesting referral for OP ST services at discharge to continue to work toward articulatory precision in challenging listener environments. Pt left sitting in recliner with husband present and needs within reach. Continue per current plan of care.     Patient has met 1 of 1 long term goals.  Patient to discharge at overall Modified Independent level.  Reasons goals not met: n/a   Clinical Impression/Discharge Summary:  Pt made functional gains and met 1 out of 1 long term goals this admission. Pt is currently Mod I for use of strategies to compensate for mild dysarthria and increase speech intelligibility at the conversation level s/p acute CVA.  Given very mild deficits still present and pt with strong desire to work toward 100% intelligibility and work in Transport planner environments, recommend pt continue to receive skilled OP ST services upon discharge. Pt and family education is complete at this time.    Care Partner:  Caregiver Able to Provide Assistance: Yes  Type of Caregiver Assistance: Cognitive;Physical  Recommendation:  Outpatient SLP  Rationale for SLP Follow Up: Maximize functional communication (pt would like to continue to work on tx for mild dysarthria)   Equipment: none   Reasons for discharge: Discharged from hospital   Patient/Family Agrees with Progress Made and Goals Achieved: Yes    Arbutus Leas 01/21/2020, 12:16 PM

## 2020-01-21 NOTE — Progress Notes (Signed)
Occupational Therapy Discharge Summary  Patient Details  Name: Mary Mercado MRN: [id number] Date of Birth: 07-21-1940  Today's Date: 01/21/2020 OT Individual Time: 4562-5638 OT Individual Time Calculation (min): 54 min    Patient has met 10 of 10 long term goals due to improved activity tolerance, improved balance, postural control, ability to compensate for deficits, functional use of  RIGHT upper and RIGHT lower extremity, improved attention, improved awareness and improved coordination.  Patient to discharge at overall Modified Independent level.  Patient's care partner is independent to provide the necessary physical assistance at discharge.    Reasons goals not met: All goals met.   Recommendation:  Patient will benefit from ongoing skilled OT services in outpatient setting to continue to advance functional skills in the area of BADL and iADL.  Equipment: No equipment provided  Reasons for discharge: treatment goals met and discharge from hospital  Patient/family agrees with progress made and goals achieved: Yes   Skilled OT Intervention: Pt received eating breakfast EOB. While pt finished breakfast completed vision and sensory testing. Pt described her several strokes in the past and BE FAST was reviewed with pt for increased CVA risk. Pt completed item retrieval prior to shower. Discussed home set up and accessibility. Pt completed shower at mod I level overall. Pt able to multitask with slight attention deficits, but able to redirect and self-monitor. Pt donned all clothing with mod I. Good safety awareness throughout session. Pt able to bimanually complete hair drying. Pt required some increased time 2/2 verbosity. Pt completed oral care with set up assist. She was left sitting up in the recliner with all needs met.   OT Discharge Precautions/Restrictions  Precautions Precautions: Fall Restrictions Weight Bearing Restrictions: No    Pain Pain Assessment Pain  Scale: 0-10 Pain Score: 0-No pain ADL ADL Eating: Independent Where Assessed-Eating: Chair Grooming: Modified independent Where Assessed-Grooming: Standing at sink Upper Body Bathing: Modified independent Where Assessed-Upper Body Bathing: Shower Lower Body Bathing: Modified independent Where Assessed-Lower Body Bathing: Shower Upper Body Dressing: Modified independent (Device) Where Assessed-Upper Body Dressing: Chair Lower Body Dressing: Modified independent Where Assessed-Lower Body Dressing: Chair Toileting: Modified independent Where Assessed-Toileting: Glass blower/designer: Diplomatic Services operational officer Method: Counselling psychologist: Energy manager: Chief Financial Officer Method: Heritage manager: Civil engineer, contracting without back Vision Baseline Vision/History: No visual deficits Patient Visual Report: No change from baseline Vision Assessment?: Yes Eye Alignment: Within Functional Limits Ocular Range of Motion: Within Functional Limits Alignment/Gaze Preference: Within Defined Limits Tracking/Visual Pursuits: Able to track stimulus in all quads without difficulty (some fatigue) Saccades: Within functional limits Convergence: Within functional limits Perception  Perception: Within Functional Limits Praxis Praxis: Intact Cognition Overall Cognitive Status: Within Functional Limits for tasks assessed Arousal/Alertness: Awake/alert Orientation Level: Oriented X4 Attention: Selective Focused Attention: Appears intact Sustained Attention: Appears intact Selective Attention: Appears intact Memory: Appears intact Awareness: Appears intact Problem Solving: Appears intact Safety/Judgment: Appears intact Sensation Sensation Light Touch: Appears Intact Hot/Cold: Appears Intact Proprioception: Appears Intact Coordination Gross Motor Movements are Fluid and Coordinated: Yes Fine Motor  Movements are Fluid and Coordinated: Yes Motor  Motor Motor: Hemiplegia Motor - Discharge Observations: Residual deficits, functional overall Mobility  Bed Mobility Bed Mobility: Rolling Right;Rolling Left;Sit to Supine;Supine to Sit Rolling Right: Independent Rolling Left: Independent Supine to Sit: Independent with assistive device Sit to Supine: Independent with assistive device Transfers Sit to Stand: Independent with assistive device Stand to Sit: Independent with assistive device  Trunk/Postural Assessment  Cervical Assessment Cervical Assessment: Within Functional Limits Thoracic Assessment Thoracic Assessment: Within Functional Limits Lumbar Assessment Lumbar Assessment: Within Functional Limits Postural Control Postural Control: Within Functional Limits  Balance Balance Balance Assessed: Yes Static Sitting Balance Static Sitting - Balance Support: No upper extremity supported Static Sitting - Level of Assistance: 6: Modified independent (Device/Increase time) Dynamic Sitting Balance Dynamic Sitting - Balance Support: No upper extremity supported Dynamic Sitting - Level of Assistance: 6: Modified independent (Device/Increase time) Static Standing Balance Static Standing - Balance Support: Bilateral upper extremity supported Static Standing - Level of Assistance: 6: Modified independent (Device/Increase time) Dynamic Standing Balance Dynamic Standing - Balance Support: Bilateral upper extremity supported Dynamic Standing - Level of Assistance: 6: Modified independent (Device/Increase time) Extremity/Trunk Assessment RUE Assessment RUE Assessment: Within Functional Limits LUE Assessment LUE Assessment: Exceptions to [facility] Passive Range of Motion (PROM) Comments: WFL Active Range of Motion (AROM) Comments: WFL General Strength Comments: Residual LUE weakness from previous CVA 4-/5.   Curtis Sites 01/21/2020, 8:12 AM

## 2020-01-21 NOTE — Progress Notes (Signed)
Physical Therapy Discharge Summary  Patient Details  Name: Mary Mercado MRN: [id number] Date of Birth: 09-10-1940  Today's Date: 01/21/2020      Patient has met 7 of 7 long term goals due to improved activity tolerance, improved balance, improved postural control, increased strength, improved attention, improved awareness and improved coordination.  Patient to discharge at an ambulatory level Supervision.   Patient's care partner is independent to provide the necessary physical assistance at discharge.  Reasons goals not met: N/A all goals met  Recommendation:  Patient will benefit from ongoing skilled PT services in outpatient setting to continue to advance safe functional mobility, address ongoing impairments in balance, vertigo, gait, safety, community mobility, and minimize fall risk.  Equipment: Rollator  Reasons for discharge: treatment goals met  Patient/family agrees with progress made and goals achieved: Yes  PT Discharge Precautions/Restrictions Precautions Precautions: Fall Restrictions Weight Bearing Restrictions: No    Pain Pain Assessment Pain Scale: 0-10 Pain Score: 0-No pain Vision/Perception  Vision - Assessment Eye Alignment: Within Functional Limits Ocular Range of Motion: Within Functional Limits Alignment/Gaze Preference: Within Defined Limits Tracking/Visual Pursuits: Able to track stimulus in all quads without difficulty (some fatigue) Saccades: Within functional limits Convergence: Within functional limits Perception Perception: Within Functional Limits Praxis Praxis: Intact  Cognition Overall Cognitive Status: Within Functional Limits for tasks assessed Arousal/Alertness: Awake/alert Orientation Level: Oriented X4 Attention: Selective Focused Attention: Appears intact Sustained Attention: Appears intact Selective Attention: Appears intact Memory: Appears intact Awareness: Appears intact Problem Solving: Appears  intact Safety/Judgment: Appears intact Sensation Sensation Light Touch: Appears Intact Hot/Cold: Appears Intact Proprioception: Appears Intact Coordination Gross Motor Movements are Fluid and Coordinated: Yes Fine Motor Movements are Fluid and Coordinated: Yes Motor  Motor Motor: Ataxia;Hemiplegia Motor - Discharge Observations: R hemiplegia, mostly resolved  Mobility Bed Mobility Bed Mobility: Rolling Right;Rolling Left;Sit to Supine;Supine to Sit Rolling Right: Independent Rolling Left: Independent Supine to Sit: Independent with assistive device Sit to Supine: Independent with assistive device Transfers Transfers: Sit to Stand;Stand Pivot Transfers;Stand to Sit Sit to Stand: Independent with assistive device Stand to Sit: Independent with assistive device Stand Pivot Transfers: Independent with assistive device Transfer (Assistive device): Rollator Locomotion  Gait Ambulation: Yes Gait Assistance: Supervision/Verbal cueing Gait Distance (Feet): 150 Feet Assistive device: Rollator Gait Gait: Yes Gait Pattern: Impaired Gait Pattern: Narrow base of support;Poor foot clearance - left;Poor foot clearance - right Gait velocity: decreased Stairs / Additional Locomotion Stairs: Yes Stairs Assistance: Contact Guard/Touching assist Stair Management Technique: Two rails Number of Stairs: 12 Height of Stairs: 6 Ramp: Contact Guard/touching assist Wheelchair Mobility Wheelchair Mobility: No  Trunk/Postural Assessment  Cervical Assessment Cervical Assessment: Within Functional Limits Thoracic Assessment Thoracic Assessment: Within Functional Limits Lumbar Assessment Lumbar Assessment: Within Functional Limits Postural Control Postural Control: Within Functional Limits  Balance Balance Balance Assessed: Yes Standardized Balance Assessment Standardized Balance Assessment: Berg Balance Test Berg Balance Test Sit to Stand: Able to stand without using hands and stabilize  independently Standing Unsupported: Able to stand safely 2 minutes Sitting with Back Unsupported but Feet Supported on Floor or Stool: Able to sit safely and securely 2 minutes Stand to Sit: Sits safely with minimal use of hands Transfers: Able to transfer safely, minor use of hands Standing Unsupported with Eyes Closed: Able to stand 10 seconds safely Standing Ubsupported with Feet Together: Able to place feet together independently and stand for 1 minute with supervision From Standing, Reach Forward with Outstretched Arm: Can reach forward >12 cm safely (5") From Standing  Position, Pick up Object from Floor: Able to pick up shoe, needs supervision From Standing Position, Turn to Look Behind Over each Shoulder: Looks behind one side only/other side shows less weight shift Turn 360 Degrees: Able to turn 360 degrees safely but slowly Standing Unsupported, Alternately Place Feet on Step/Stool: Able to stand independently and complete 8 steps >20 seconds Standing Unsupported, One Foot in Front: Able to plae foot ahead of the other independently and hold 30 seconds Standing on One Leg: Able to lift leg independently and hold > 10 seconds Total Score: 48 Timed Up and Go Test TUG: Normal TUG Normal TUG (seconds): 26.8 Static Sitting Balance Static Sitting - Balance Support: No upper extremity supported Static Sitting - Level of Assistance: 6: Modified independent (Device/Increase time) Dynamic Sitting Balance Dynamic Sitting - Balance Support: No upper extremity supported Dynamic Sitting - Level of Assistance: 6: Modified independent (Device/Increase time) Static Standing Balance Static Standing - Balance Support: Bilateral upper extremity supported Static Standing - Level of Assistance: 6: Modified independent (Device/Increase time) Dynamic Standing Balance Dynamic Standing - Balance Support: Bilateral upper extremity supported Dynamic Standing - Level of Assistance: 6: Modified independent  (Device/Increase time) Extremity Assessment      RLE Assessment RLE Assessment: Within Functional Limits General Strength Comments: grossly 4+/5 LLE Assessment LLE Assessment: Within Functional Limits General Strength Comments: grossly 4+/5    [name] 01/21/2020, 7:50 AM

## 2020-01-21 NOTE — Progress Notes (Signed)
[place] PHYSICAL MEDICINE & REHABILITATION PROGRESS NOTE   Subjective/Complaints:  No issues overnite, discussed blood work and discharge in am   ROS: Patient denies CP, SOB, N/V/D  Objective:   No results found. Recent Labs    01/20/20 0704  WBC 5.4  HGB 14.0  HCT 43.7  PLT 247   Recent Labs    01/20/20 0704  NA 139  K 4.1  CL 103  CO2 30  GLUCOSE 119*  BUN 9  CREATININE 0.63  CALCIUM 9.4    Intake/Output Summary (Last 24 hours) at 01/21/2020 0742 Last data filed at 01/20/2020 1855 Gross per 24 hour  Intake 580 ml  Output --  Net 580 ml        Physical Exam: Vital Signs Blood pressure 132/77, pulse 62, temperature 98.1 F (36.7 C), resp. rate 16, height 5' 5"  (1.651 m), weight 62.9 kg, SpO2 94 %.    Constitutional: No distress . Vital signs reviewed. HEENT: EOMI, oral membranes moist Neck: supple Cardiovascular: RRR without murmur. No JVD    Respiratory/Chest: CTA Bilaterally without wheezes or rales. Normal effort    GI/Abdomen: BS +, non-tender, non-distended Ext: no clubbing, cyanosis, or edema Psych: pleasant and cooperative Skin: No evidence of breakdown, no evidence of rash Neurologic: sl dysarthria.  Cranial nerves II through XII intact, motor strength is 5/5 in R and 4/5 Left deltoid, bicep, tricep, grip, hip flexor, knee extensors, ankle dorsiflexor and plantar flexor  Musculoskeletal: Full range of motion in all 4 extremities. No joint swelling     Assessment/Plan: 1. 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  Physiatrist is providing close team supervision and 24 hour management of active medical problems listed below.  Physiatrist and rehab team continue to assess barriers to discharge/monitor patient progress toward functional and medical goals  Care Tool:  Bathing    Body parts bathed by patient: Right  arm, Face, Left arm, Chest, Abdomen, Front perineal area, Right upper leg, Left upper leg, Right lower leg, Left lower leg, Buttocks   Body parts bathed by helper: Buttocks     Bathing assist Assist Level: Independent with assistive device     Upper Body Dressing/Undressing Upper body dressing   What is the patient wearing?: Pull over shirt    Upper body assist Assist Level: Independent with assistive device    Lower Body Dressing/Undressing Lower body dressing      What is the patient wearing?: Underwear/pull up, Pants     Lower body assist Assist for lower body dressing: Independent with assitive device     Toileting Toileting    Toileting assist Assist for toileting: Independent with assistive device     Transfers Chair/bed transfer  Transfers assist     Chair/bed transfer assist level: Independent with assistive device     Locomotion Ambulation   Ambulation assist      Assist level: Supervision/Verbal cueing Assistive device: Walker-rolling Max distance: 100'   Walk 10 feet activity   Assist     Assist level: Minimal Assistance - Patient > 75% Assistive device: Hand held assist   Walk 50 feet activity   Assist Walk 50 feet with 2 turns activity did not occur: Safety/medical concerns         Walk 150 feet activity   Assist Walk 150 feet activity did not occur: Safety/medical concerns    Assistive device: Walker-rolling    Walk  10 feet on uneven surface  activity   Assist Walk 10 feet on uneven surfaces activity did not occur: Safety/medical concerns         Wheelchair     Assist Will patient use wheelchair at discharge?: No      Wheelchair assist level: Minimal Assistance - Patient > 75% Max wheelchair distance: 150    Wheelchair 50 feet with 2 turns activity    Assist        Assist Level: Minimal Assistance - Patient > 75%   Wheelchair 150 feet activity     Assist      Assist Level: Minimal  Assistance - Patient > 75%   Blood pressure 132/77, pulse 62, temperature 98.1 F (36.7 C), resp. rate 16, height 5' 5"  (1.651 m), weight 62.9 kg, SpO2 94 %.  Medical Problem List and Plan: 1.  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  Antithrombotics: -DVT/anticoagulation:  Pharmaceutical: Pradexa--family aware and agreeable of $118.80/- copay. (changed from Eliquis)  CBC normal 9/27             -antiplatelet therapy: N/A 3. Dysesthesias/Pain Management: Off Cymbalta due to SE.  4. Mood: LCSW to follow for evaluation and support.              -antipsychotic agents: N/A 5. Neuropsych: This patient is capable of making decisions on her own behalf. 6. Skin/Wound Care: Routine pressure relief measures.  7. Fluids/Electrolytes/Nutrition: Monitor I/O. Check lytes in am.  8. PAF: Monitor HR tid--on Flecainide and diltiazem for rate control. .  9. Hypokalemia: added additional supplement X 2 , still mildly low , siupplement KCL 46mq qd- recheck Met panel 9/27 normal K+ 10.  Pre-diabetes: Hgb A1c- 5.9. Add CM restrictions to diet. Will have RD educate on HH/CM diet. 11. Dyslipidemia: Lipitor was increased to 80 mg/day 12. HTN- on diltiazem; Micardis being held.  Vitals:   01/20/20 2026 01/21/20 0322  BP: 117/75 132/77  Pulse: 62 62  Resp: 18 16  Temp: 97.9 F (36.6 C) 98.1 F (36.7 C)  SpO2: 96% 94%  on cardizem CD 1285mper day - bp's controlled 9/28 13. Initial dysphagia and urinary retention- resolved.    LOS: 7 days A FACE TO FACE EVALUATION WAS PERFORMED  AnCharlett Blake/28/2021, 7:42 AM

## 2020-01-22 ENCOUNTER — Telehealth: Payer: Self-pay | Admitting: Family Medicine

## 2020-01-22 MED ORDER — ATORVASTATIN CALCIUM 80 MG PO TABS: 80.0000 mg | ORAL_TABLET | Freq: Every day | ORAL | 0 refills | Status: DC

## 2020-01-22 MED ORDER — POTASSIUM CHLORIDE CRYS ER 10 MEQ PO TBCR: 10.0000 meq | EXTENDED_RELEASE_TABLET | Freq: Every day | ORAL | 0 refills | Status: DC

## 2020-01-22 MED ORDER — DABIGATRAN ETEXILATE MESYLATE 150 MG PO CAPS: 150.0000 mg | ORAL_CAPSULE | Freq: Two times a day (BID) | ORAL | 0 refills | Status: DC

## 2020-01-22 MED ORDER — DILTIAZEM HCL ER COATED BEADS 120 MG PO CP24: 120.0000 mg | ORAL_CAPSULE | Freq: Every day | ORAL | 0 refills | Status: DC

## 2020-01-22 NOTE — Telephone Encounter (Signed)
Transition Care Management Unsuccessful Follow-up Telephone Call  Date of discharge and from where:  Mary Mercado 01/21/2020  Attempts:  1st Attempt  Reason for unsuccessful TCM follow-up call:  Left voice message

## 2020-01-22 NOTE — Progress Notes (Signed)
Patient d/c off of unit with all belongings.  Patient and husband given d/c instructions by PA.  No belongings left in the room.  Patient and spouse verbalized understanding.

## 2020-01-22 NOTE — Plan of Care (Signed)
  Problem: Consults Goal: RH STROKE PATIENT EDUCATION Description: See Patient Education module for education specifics  Outcome: Completed/Met   Problem: RH SKIN INTEGRITY Goal: RH STG SKIN FREE OF INFECTION/BREAKDOWN Description: No skin breakdown this admission with mod I Outcome: Completed/Met   Problem: RH SAFETY Goal: RH STG ADHERE TO SAFETY PRECAUTIONS W/ASSISTANCE/DEVICE Description: STG Adhere to Safety Precautions With mod I Assistance/Device. Outcome: Completed/Met   Problem: RH PAIN MANAGEMENT Goal: RH STG PAIN MANAGED AT OR BELOW PT'S PAIN GOAL Description: Less than 3 out of 10 Outcome: Completed/Met   Problem: RH KNOWLEDGE DEFICIT Goal: RH STG INCREASE KNOWLEDGE OF HYPERTENSION Description: Patient and family will be able to manage HTN with medications and dietary restriction using handouts and educational materials with cues/reminders  Outcome: Completed/Met Goal: RH STG INCREASE KNOWLEGDE OF HYPERLIPIDEMIA Description: Patient and family will be able to manage HLD with medications and dietary restriction using handouts and educational materials with cues/reminders  Outcome: Completed/Met Goal: RH STG INCREASE KNOWLEDGE OF STROKE PROPHYLAXIS Description: Patient and family will be able to manage secondary stroke prevention with medications using handouts and educational materials with cues/reminders  Outcome: Completed/Met

## 2020-01-22 NOTE — Progress Notes (Signed)
Appears to be resting comfortably throughout shift with no acute distress or discomfot noted. Respiration even and unlabored, up several times to BR with assistance. Patient anticipates discharge home today,she is  in good spirits, Monitor and assisted,call bell within reach, bed alarm on.

## 2020-01-22 NOTE — Progress Notes (Signed)
[facility] PHYSICAL MEDICINE & REHABILITATION PROGRESS NOTE   Subjective/Complaints:  Slept poorly excited about going home, discussed no driving   ROS: Patient denies CP, SOB, N/V/D  Objective:   No results found. Recent Labs    01/20/20 0704  WBC 5.4  HGB 14.0  HCT 43.7  PLT 247   Recent Labs    01/20/20 0704  NA 139  K 4.1  CL 103  CO2 30  GLUCOSE 119*  BUN 9  CREATININE 0.63  CALCIUM 9.4    Intake/Output Summary (Last 24 hours) at 01/22/2020 0818 Last data filed at 01/21/2020 1821 Gross per 24 hour  Intake 500 ml  Output --  Net 500 ml        Physical Exam: Vital Signs Blood pressure 136/85, pulse 63, temperature 98.4 F (36.9 C), temperature source Oral, resp. rate 18, height _0  (1.651 m), weight 62.9 kg, SpO2 98 %.  General: No acute distress Mood and affect are appropriate Heart: Regular rate and rhythm no rubs murmurs or extra sounds Lungs: Clear to auscultation, breathing unlabored, no rales or wheezes Abdomen: Positive bowel sounds, soft nontender to palpation, nondistended Extremities: No clubbing, cyanosis, or edema Skin: No evidence of breakdown, no evidence of rash  Neurologic: sl dysarthria.  Cranial nerves II through XII intact, motor strength is 5/5 in R and 4/5 Left deltoid, bicep, tricep, grip, hip flexor, knee extensors, ankle dorsiflexor and plantar flexor  Musculoskeletal: Full range of motion in all 4 extremities. No joint swelling     Assessment/Plan: 1. 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  Physiatrist is providing close team supervision and 24 hour management of active medical problems listed below.  Physiatrist and rehab team continue to assess barriers to discharge/monitor patient progress toward functional and medical goals  Care Tool:  Bathing    Body parts bathed by patient: Right arm,  Face, Left arm, Chest, Abdomen, Front perineal area, Right upper leg, Left upper leg, Right lower leg, Left lower leg, Buttocks   Body parts bathed by helper: Buttocks     Bathing assist Assist Level: Independent with assistive device     Upper Body Dressing/Undressing Upper body dressing   What is the patient wearing?: Bra, Pull over shirt    Upper body assist Assist Level: Independent with assistive device    Lower Body Dressing/Undressing Lower body dressing      What is the patient wearing?: Underwear/pull up, Pants     Lower body assist Assist for lower body dressing: Independent with assitive device     Toileting Toileting    Toileting assist Assist for toileting: Independent with assistive device     Transfers Chair/bed transfer  Transfers assist     Chair/bed transfer assist level: Independent with assistive device     Locomotion Ambulation   Ambulation assist      Assist level: Supervision/Verbal cueing Assistive device: Rollator Max distance: 150   Walk 10 feet activity   Assist     Assist level: Supervision/Verbal cueing Assistive device: Rollator   Walk 50 feet activity   Assist Walk 50 feet with 2 turns activity did not occur: Safety/medical concerns  Assist level: Supervision/Verbal cueing Assistive device: Rollator    Walk 150 feet activity   Assist Walk 150 feet activity did not occur: Safety/medical concerns  Assist level: Supervision/Verbal cueing Assistive device: Rollator    Walk 10 feet on uneven  surface  activity   Assist Walk 10 feet on uneven surfaces activity did not occur: Safety/medical concerns   Assist level: Supervision/Verbal cueing Assistive device: Rollator   Wheelchair     Assist Will patient use wheelchair at discharge?: No      Wheelchair assist level: Minimal Assistance - Patient > 75% Max wheelchair distance: 150    Wheelchair 50 feet with 2 turns activity    Assist         Assist Level: Minimal Assistance - Patient > 75%   Wheelchair 150 feet activity     Assist      Assist Level: Minimal Assistance - Patient > 75%   Blood pressure 136/85, pulse 63, temperature 98.4 F (36.9 C), temperature source Oral, resp. rate 18, height _0  (1.651 m), weight 62.9 kg, SpO2 98 %.  Medical Problem List and Plan: 1.  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  Antithrombotics: -DVT/anticoagulation:  Pharmaceutical: Pradexa--family aware and agreeable of $118.80/- copay. (changed from Eliquis)  CBC normal 9/27             -antiplatelet therapy: N/A 3. Dysesthesias/Pain Management: Off Cymbalta due to SE.  4. Mood: LCSW to follow for evaluation and support.              -antipsychotic agents: N/A 5. Neuropsych: This patient is capable of making decisions on her own behalf. 6. Skin/Wound Care: Routine pressure relief measures.  7. Fluids/Electrolytes/Nutrition: Monitor I/O. Check lytes in am.  8. PAF: Monitor HR tid--on Flecainide and diltiazem for rate control. .  9. Hypokalemia: added additional supplement X 2 , still mildly low , siupplement KCL 60mq qd- recheck Met panel 9/27 normal K+ 10.  Pre-diabetes: Hgb A1c- 5.9. Add CM restrictions to diet. Will have RD educate on HH/CM diet. 11. Dyslipidemia: Lipitor was increased to 80 mg/day 12. HTN- on diltiazem; Micardis being held.  Vitals:   01/21/20 2006 01/22/20 0409  BP: 130/69 136/85  Pulse: 62 63  Resp: 18   Temp: 98.2 F (36.8 C) 98.4 F (36.9 C)  SpO2: 95% 98%  on cardizem CD 1242mper day - bp's controlled 9/28 13. Initial dysphagia and urinary retention- resolved.    LOS: 8 days A FACE TO FACE EVALUATION WAS PERFORMED  AnCharlett Blake/29/2021, 8:18 AM

## 2020-01-22 NOTE — Discharge Summary (Addendum)
Physician Discharge Summary  Patient ID: Mary Mercado MRN: [id number] DOB/AGE: 1940-05-11 79 y.o.  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 [facility]) Active Problems:   Essential hypertension   Prediabetes   Atrial fibrillation [facility])   Discharged Condition: good  Significant Diagnostic Studies: N/A   Labs:  Basic Metabolic Panel: BMP Latest Ref Rng & Units 01/20/2020 01/15/2020 01/14/2020  Glucose 70 - 99 mg/dL 119(H) 123(H) 110(H)  BUN 8 - 23 mg/dL 9 10 8   Creatinine 0.44 - 1.00 mg/dL 0.63 0.62 0.65  BUN/Creat Ratio 12 - 28 - - -  Sodium 135 - 145 mmol/L 139 138 141  Potassium 3.5 - 5.1 mmol/L 4.1 3.4(L) 3.1(L)  Chloride 98 - 111 mmol/L 103 99 103  CO2 22 - 32 mmol/L 30 27 27   Calcium 8.9 - 10.3 mg/dL 9.4 9.3 9.0    CBC: CBC Latest Ref Rng & Units 01/20/2020 01/15/2020 01/14/2020  WBC 4.0 - 10.5 K/uL 5.4 7.3 7.3  Hemoglobin 12.0 - 15.0 g/dL 14.0 15.1(H) 14.2  Hematocrit 36 - 46 % 43.7 46.3(H) 42.5  Platelets 150 - 400 K/uL 247 242 233    CBG: No results for input(s): GLUCAP in the last 168 hours.  Brief HPI:   Mary Mercado is a 79 y.o. Rh-female with history of CAD, right-CVA, PAF s/p DCCV recently; who was admitted on 01/09/2020 with acute onset of dizziness, slurred speech and nausea and vomiting while at cardiologist office.  CTA head/neck showed moderate to severe stenosis left HP and moderate stenosis right P2 segment.  MRI brain showed acute 3 mm infarct in right-ACA and evolution of prior right-MCA infarct.  2D echo showed EF 60 to 65% with biatrial dilatation, moderate PAH and mild to moderate aortic valve sclerosis.  Stroke was felt to be embolic due to A. fib and Eliquis was changed to Pradaxa per Dr. Clydene Fake input.  Therapy evaluations done revealing mild dysarthria with mild memory deficits, vestibular symptoms as well as balance deficits  affecting ADLs and mobility.  CIR was recommended due to functional decline.   Hospital Course: Mary Mercado was admitted to rehab 01/14/2020 for inpatient therapies to consist of PT, ST and OT at least three hours five days a week. Past admission physiatrist, therapy team and rehab RN have worked together to provide customized collaborative inpatient rehab. Her blood pressures were monitored on TID basis and have been reasonably controlled on diltiazem.  Heart rate has been monitored on TID basis and is stable on current regimen.  Carb modified restriction was added to diet  due to prediabetes and she has been educated on CM diet.   She was found to be hypokalemic at admission and K-Dur was added for supplementation with follow up labs showing resolution.  Serial CBC shows H&H and platelets are stable.  Her p.o. intake has been good and she is continent of bowel and bladder.  She has made good gains during her rehab stay and is currently at modified independent to supervision level.  She will continue to receive follow-up outpatient PT, OT and ST at [facility] neuro rehab after discharge   Rehab course: During patient's stay in rehab weekly team conferences were held to monitor patient's progress, set goals and discuss barriers to discharge. At admission, patient required min assist with mobility and with ADL tasks. She  has had improvement in activity tolerance, balance, postural control as well as  ability to compensate for deficits. She has had improvement in functional use RUE  and RLE as well as improvement in awareness. She is able to complete ADL tasks at modified independent level.  She requires supervision with mobility and Berg balance score has improved to 48/56.  She is able to use speech strategies at modified independent level and speech intelligibility has improved.  Family education was completed regarding all aspects of safety and care.  Disposition: Home  Diet: Heart healthy/carb  modified  Special Instructions: 1.  No driving or strenuous activity till cleared by MD. 2.  Repeat BMET 1 to 2 weeks to monitor potassium levels.  Allergies as of 01/22/2020      Reactions   Duloxetine    Causes Bp elevation.  Increased body jerks, nausea   Acetaminophen Hives, Rash      Medication List    STOP taking these medications   diltiazem 30 MG tablet Commonly known as: CARDIZEM   Eliquis 5 MG Tabs tablet Generic drug: apixaban   telmisartan 20 MG tablet Commonly known as: MICARDIS     TAKE these medications   atorvastatin 80 MG tablet Commonly known as: LIPITOR Take 1 tablet (80 mg total) by mouth daily at 6 PM. What changed:   medication strength  how much to take Notes to patient: Note increased   dabigatran 150 MG Caps capsule Commonly known as: PRADAXA Take 1 capsule (150 mg total) by mouth every 12 (twelve) hours. Notes to patient: Takes place of Eliquis   diltiazem 120 MG 24 hr capsule Commonly known as: CARDIZEM CD Take 1 capsule (120 mg total) by mouth daily. What changed:   medication strength  how much to take  how to take this  when to take this  additional instructions Notes to patient: Note that dose was decreased   flecainide 50 MG tablet Commonly known as: TAMBOCOR Take 1 tablet (50 mg total) by mouth 2 (two) times daily.   potassium chloride 10 MEQ tablet Commonly known as: KLOR-CON Take 1 tablet (10 mEq total) by mouth daily.       Follow-up Information    [name], MD. Call.   Specialty: Family Medicine Why: for post hospital follow up Contact information: [place] [address] [phone]        Nahser, Wonda Cheng, MD .   Specialty: Cardiology Contact information: [address] [place] [address] [phone]        [facility]. Call in 1 day(s).   Why: for post stroke follow up Contact information: [address]     [place] [address] [phone]              Signed: Bary Leriche 01/23/2020, 1:44 AM

## 2020-01-22 NOTE — Progress Notes (Signed)
Inpatient Rehabilitation Care Coordinator  Discharge Note  The overall goal for the admission was met for:   Discharge location: Yes, Home  Length of Stay: Yes, 8 Days  Discharge activity level: Yes, Supervision   Home/community participation: Yes  Services provided included: MD, RD, PT, OT, SLP, RN, CM, TR, Pharmacy, Neuropsych and SW  Financial Services: Medicare  Follow-up services arranged: Outpatient: [facility]  Comments (or additional information): PT OT SLP  Patient/Family verbalized understanding of follow-up arrangements: Yes  Individual responsible for coordination of the follow-up plan: [name] [phone]  Confirmed correct DME delivered: Dyanne Iha 01/22/2020    Dyanne Iha

## 2020-01-23 NOTE — Telephone Encounter (Signed)
Transition Care Management Unsuccessful Follow-up Telephone Call  Date of discharge and from where:  01/21/2020 from [facility]  Attempts:  2nd Attempt  Reason for unsuccessful TCM follow-up call:  Left voice message

## 2020-01-24 ENCOUNTER — Telehealth: Payer: Self-pay | Admitting: *Deleted

## 2020-01-24 NOTE — Telephone Encounter (Signed)
Transition Care Management Follow-up Telephone Call  Date of discharge and from where: 01/22/2020 from [facility]   How have you been since you were released from the hospital? Patient's husband states patient has been doing good  Any questions or concerns? No  Items Reviewed:  Did the pt receive and understand the discharge instructions provided? Yes   Medications obtained and verified? Yes   Any new allergies since your discharge? No   Dietary orders reviewed? Yes  Do you have support at home? Yes   Functional Questionnaire: (I = Independent and D = Dependent) ADLs: I  Bathing/Dressing- I  Meal Prep- I  Eating- I  Maintaining continence- I  Transferring/Ambulation- I with walker  Managing Meds- I  Follow up appointments reviewed:   PCP Hospital f/u appt confirmed? Yes  Scheduled to see Dr. Sarajane Jews on 02/05/2020 @ 2:00 PM.  [facility] f/u appt confirmed? Yes  Scheduled to see Dr.Nahser on 02/07/2020 @ 11:40 am.  Are transportation arrangements needed? No   If their condition worsens, is the pt aware to call PCP or go to the Emergency Dept.? Yes  Was the patient provided with contact information for the PCP's office or ED? Yes  Was to pt encouraged to call back with questions or concerns? Yes

## 2020-01-24 NOTE — Telephone Encounter (Signed)
I left a message for Mrs Beyersdorf.  I see that Dr Barbie Banner office has already done the [facility] call and our appts are on the same day one hour apart. I told Mrs Buffalo on her VM that our office will call her back with a new appt date and time and I will let Dr Barbie Banner office know they can do the transitional care visit on 02/05/20 since they completed the Transitional Care call.

## 2020-02-01 ENCOUNTER — Telehealth: Payer: Self-pay | Admitting: Physician Assistant

## 2020-02-01 NOTE — Telephone Encounter (Signed)
Paged by the family of Mary Mercado, she likely has went back into atrial fibrillation, heart rate is in the 140s.  She is compliant with flecainide, diltiazem and Pradaxa.  Due to recent stroke, Eliquis was switched to Pradaxa.  Her heart rate today is 140s.  Blood pressure 139/104.  She has taken a short acting 30 mg diltiazem this morning.  I recommended she take a extra 120 mg capsule of long-acting diltiazem.  If her heart rate still is above 120 bpm after 1 to 1.5 hours of taking the medication, she will need to come to the emergency room.

## 2020-02-01 NOTE — Telephone Encounter (Signed)
Agree with note / plan by Almyra Deforest, PA

## 2020-02-03 ENCOUNTER — Other Ambulatory Visit: Payer: Self-pay

## 2020-02-03 ENCOUNTER — Ambulatory Visit (INDEPENDENT_AMBULATORY_CARE_PROVIDER_SITE_OTHER): Payer: Medicare Other | Admitting: Adult Health

## 2020-02-03 ENCOUNTER — Encounter: Payer: Self-pay | Admitting: Adult Health

## 2020-02-03 VITALS — BP 122/76 | HR 78 | Ht 65.0 in | Wt 134.0 lb

## 2020-02-03 DIAGNOSIS — I69322 Dysarthria following cerebral infarction: Secondary | ICD-10-CM | POA: Diagnosis not present

## 2020-02-03 DIAGNOSIS — R269 Unspecified abnormalities of gait and mobility: Secondary | ICD-10-CM | POA: Diagnosis not present

## 2020-02-03 DIAGNOSIS — I639 Cerebral infarction, unspecified: Secondary | ICD-10-CM | POA: Diagnosis not present

## 2020-02-03 DIAGNOSIS — Z9189 Other specified personal risk factors, not elsewhere classified: Secondary | ICD-10-CM | POA: Diagnosis not present

## 2020-02-03 DIAGNOSIS — I69398 Other sequelae of cerebral infarction: Secondary | ICD-10-CM

## 2020-02-03 DIAGNOSIS — I4819 Other persistent atrial fibrillation: Secondary | ICD-10-CM | POA: Diagnosis not present

## 2020-02-03 DIAGNOSIS — R278 Other lack of coordination: Secondary | ICD-10-CM

## 2020-02-03 DIAGNOSIS — Z8673 Personal history of transient ischemic attack (TIA), and cerebral infarction without residual deficits: Secondary | ICD-10-CM | POA: Diagnosis not present

## 2020-02-03 NOTE — Progress Notes (Signed)
[facility] [address] [place]. [place] [address] [phone]       OFFICE FOLLOW-UP NOTE  [name] Date of Birth:  [birth year] Medical Record Number:  [id number]   Reason for visit: Stroke follow-up  Chief complaint: Chief Complaint  Patient presents with  . Follow-up    rm 9  . Cerebrovascular Accident    Pt here a f/u on a stroke. Pts said he is concerned about the pt BP being elevated.  Husband said be went up to 168/105 hr-90      HPI:   Today, 02/03/2020, [name] returns for stroke follow-up as well as recent stroke on 01/09/2020.  She is accompanied by her husband.  Presented to ER with slurred speech, facial droop vertigo and ataxia while at her cardiologist office the morning of 01/09/2020.  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  Switched anticoagulation to Pradaxa.  HTN stable.  LDL 81 and increase atorvastatin from 40 mg to 80 mg daily.  Other stroke risk factors include advanced age and history of right MCA stroke in 05/2018.  Evaluated by therapy with residual dysarthria, mild memory deficits, vestibular symptoms and imbalance affecting ADLs and mobility therefore discharged to CIR with functional decline.  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 Old infarct R frontoparietal cortex. Small vessel disease. ASPECTS 10.     CTA head & neck no LVO. B ICA bifurcation atherosclerosis, supraclinoid B ICA atherosclerosis. L VA origin moderate to severe stenosis. R P2 moderate stenosis.   MRI  R superior cerebellar territory infarct. Evolution of prior R MCA infarcts from last year. Small vessel disease.   2D Echo EF 60-65%. No source of embolus. LA severely dilated.  LDL 81  HgbA1c 5.9  VTE prophylaxis - Lovenox 40 mg sq daily   Eliquis (apixaban) daily prior to admission, now on  aspirin 81 mg daily.   Switched to Pradaxa  Therapy recommendations:   CIR  Disposition:   CIR   She was discharged home from CIR on 01/22/2020 after an 8-day stay.  Since discharge, she reports residual " whooshy head" sensation, gait unsteadiness and mild dysarthria but reports ongoing improvement.  Recommended outpatient PT/OT/SLP at discharge from CIR but referral was not placed at discharge.  She has continued to do exercises at home as recommended during CIR stay.  Continues to use Rollator walker for ambulation and denies any recent falls.  Denies vertigo or dizziness sensation.  Denies new or worsening stroke/TIA symptoms.  Prior stroke deficit of left-sided paresthesias greatly improving and only experience occasional numbness/tingling.  Remains on Pradaxa without bleeding or bruising.  Remains on atorvastatin 80 mg daily without myalgias.  Blood pressure today 122/76.  Monitors at home which has been stable.  She does report urinary frequency over the past week and is concerned for possible UTI but has been increasing water intake and drinking cranberry juice.  Denies fever, dysuria or hematuria.  Typically, nocturia only on occasion but does report snoring, daytime naps and occasional insomnia.  She has not previously underwent sleep study.  No further concerns at this time.    History provided for reference purposes only Update 08/01/2019 JM: [name] is a 79 year old female who is being seen today, 08/01/2019, for stroke follow-up with residual left-sided paresthesias and post stroke anxiety accompanied by her husband.  Initiated Cymbalta at  prior visit due to ongoing paresthesias and severe anxiety but unfortunately caused hypertension therefore advised to discontinue. Paresthesias have been stable from prior visit without worsening with intermittent symptoms that occur randomly.  She does continue to have occasional balance difficulties and if ambulating or standing for too long she will  start to feel "off".  Referral placed at prior visit to PT but apparently has not been called to schedule initial evaluation.  She continues to experience anxiety which has only been present for stroke but does feel slight improvement since prior visit.  Remains on Eliquis and atorvastatin without side effects.  Blood pressure today 132/74.  No further concerns at this time.  Update 05/02/2019: [name] is a 79 year old female who is being seen today for stroke follow-up accompanied by her husband.  Residual deficits left-sided paresthesias consisting of numbness/tingling and occasional burning sensation.  She also endorses episodes of sensation of full body "intermittently shaking" and is usually worsened by stress, anxiety or fatigue.  Usually accompanied by nausea but denies headache or dizziness.  She becomes fearful during those times that she may be having a heart attack.  When questioned regarding anxiety, she does endorse minimal anxiety but after doing GAD-7 test, score of 20/21 showing severe anxiety.  She denies any prior history or family history of depression or anxiety.  She continues on Eliquis without bleeding or bruising.  She continues on atorvastatin without myalgias.  Blood pressure today 140/81.  She denies any additional episodes of dizziness and continues to follow with cardiology for blood pressure management.  She has recently decreased telmisartan as it is recommended to continue blood pressure between 130-150 due to vertebral artery stenosis.  No further concerns at this time.   Update 11/19/2018 Dr. Leonie Man: She is seen today for office follow-up visit following initial video follow-up visit on 08/13/2018.  She is accompanied by her husband.  She states she continues to have left leg paresthesias and numbness.  She has some mild gait and balance difficulties.  At times she noticed that the legs are trembling and she is initiating walk and she has to hold on something and the feeling  goes away.  She has also been started on several new cardiac medications per her ablation for A. fib and feels medication may be the cause for her dizziness.  She is tolerating Eliquis well without bruising or bleeding.  Blood pressures well controlled and today it is 136/79.  She remains on Lipitor which is tolerating well without muscle aches and pains.  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  Initial video visit 08/13/2018 Dr. Leonie Man : This is a initial video virtual consultation visit on [name] who was admitted to [facility] in February 2020 with a stroke.  I have personally obtained history of presenting illness from the patient and her husband and reviewed electronic medical records as well as imaging films in PACS.  She was admitted on 05/31/2018 with sudden onset of left arm and leg weakness and numbness as well as feeling dizzy and had one episode of emesis.  She had CT scan and CT angiogram in the ER which showed no large vessel occlusion she was given IV TPA and admitted to the neurological intensive care unit.  NIH stroke scale on admission was 4.  At baseline modified Rankin score was 0.  Patient had tight blood pressure control.  MRI scan  showed a right frontal and parietal embolic MCA branch infarct with trace petechial hemorrhage.  Transthoracic echo showed normal ejection fraction.  Transesophageal echocardiogram showed no cardiac source of embolism or PFO.  LDL cholesterol was elevated at 1 1 7  mg percent.  Hemoglobin A1c was 5.9.  Urine drug screen was negative.  Lower extremity venous Dopplers were negative for DVT.  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  Patient was started on aspirin 81  and Plavix 75 mg daily for 3 weeks and underwent an outpatient 30-day heart monitor which subsequently showed paroxysmal atrial fibrillation.  She has since then stopped aspirin and Plavix and has been switched to Eliquis.  Patient states she has done well since discharge.  She was initially transferred to inpatient rehab where she stayed for a few weeks and did well.  She is still has left-sided numbness but feels some of the sensation may be coming back now.  She can walk independently without assistance though her husband stays close by.  She has had no falls or injuries.  She is tolerating Eliquis well without bleeding or bruising.  She is also tolerating Lipitor well.  She complains of little bit of jitteriness and tiredness but she blames this likely on her new A. fib medication.  She has no new complaints.  She has no prior history of strokes TIAs.       ROS:   14 system review of systems is positive for those listed in HPI and all other systems negative  PMH:  Past Medical History:  Diagnosis Date  . Allergy   . CVA (cerebral vascular accident) ([place])   . Hyperlipidemia LDL goal <70   . Hypertension   . Palpitations   . Vertigo, benign positional     Social History:  Social History   Socioeconomic History  . Marital status: Married    Spouse name: Not on file  . Number of children: Not on file  . Years of education: Not on file  . Highest education level: Not on file  Occupational History  . Not on file  Tobacco Use  . Smoking status: Never Smoker  . Smokeless tobacco: Never Used  Vaping Use  . Vaping Use: Never used  Substance and Sexual Activity  . Alcohol use: No  . Drug use: No  . Sexual activity: Not on file  Other Topics Concern  . Not on file  Social History Narrative  . Not on file   Social Determinants of Health   Financial Resource Strain:   . Difficulty of Paying Living Expenses: Not on file  Food Insecurity:   . Worried About Charity fundraiser  in the Last Year: Not on file  . Ran Out of Food in the Last Year: Not on file  Transportation Needs:   . Lack of Transportation (Medical): Not on file  . Lack of Transportation (Non-Medical): Not on file  Physical Activity:   . Days of Exercise per Week: Not on file  . Minutes of Exercise per Session: Not on file  Stress:   . Feeling of Stress : Not on file  Social Connections:   . Frequency of Communication with Friends and Family: Not on file  . Frequency of Social Gatherings with Friends and Family: Not on file  . Attends Religious Services:  Not on file  . Active Member of Clubs or Organizations: Not on file  . Attends Archivist Meetings: Not on file  . Marital Status: Not on file  Intimate Partner Violence:   . Fear of Current or Ex-Partner: Not on file  . Emotionally Abused: Not on file  . Physically Abused: Not on file  . Sexually Abused: Not on file    Medications:   Current Outpatient Medications on File Prior to Visit  Medication Sig Dispense Refill  . atorvastatin (LIPITOR) 80 MG tablet Take 1 tablet (80 mg total) by mouth daily at 6 PM. 30 tablet 0  . dabigatran (PRADAXA) 150 MG CAPS capsule Take 1 capsule (150 mg total) by mouth every 12 (twelve) hours. 60 capsule 0  . diltiazem (CARDIZEM CD) 120 MG 24 hr capsule Take 1 capsule (120 mg total) by mouth daily. 30 capsule 0  . diltiazem (CARDIZEM) 30 MG tablet Take 30 mg by mouth 4 (four) times daily.    . flecainide (TAMBOCOR) 50 MG tablet Take 1 tablet (50 mg total) by mouth 2 (two) times daily. 180 tablet 3  . potassium chloride (KLOR-CON) 10 MEQ tablet Take 1 tablet (10 mEq total) by mouth daily. 30 tablet 0   Current Facility-Administered Medications on File Prior to Visit  Medication Dose Route Frequency Provider Last Rate Last Admin  . regadenoson (LEXISCAN) injection SOLN 0.4 mg  0.4 mg Intravenous Once Buford Dresser, MD      . technetium tetrofosmin (TC-MYOVIEW) injection 32 millicurie  32  millicurie Intravenous Once PRN Buford Dresser, MD        Allergies:   Allergies  Allergen Reactions  . Duloxetine     Causes Bp elevation.  Increased body jerks, nausea  . Acetaminophen Hives and Rash    Physical Exam  Today's Vitals   02/03/20 1226  BP: 122/76  Pulse: 78  Weight: 134 lb (60.8 kg)  Height: 5\' 5"  (1.651 m)   Body mass index is 22.3 kg/m.   General: frail pleasant elderly Caucasian lady seated, in no evident distress Head: head normocephalic and atraumatic.  Neck: supple with no carotid or supraclavicular bruits Cardiovascular: irregular rate and rhythm, no murmurs Musculoskeletal: no deformity Skin:  no rash/petichiae Vascular:  Normal pulses all extremities   Neurologic Exam Mental Status: Awake and fully alert.  Mild dysarthria with occasional speech hesitancy and word finding difficulty. Oriented to place and time. Recent and remote memory intact. Attention span, concentration and fund of knowledge appropriate. Mood and affect appropriate Cranial Nerves: Pupils equal, briskly reactive to light. Extraocular movements full without nystagmus. Visual fields full to confrontation. Hearing intact. Facial sensation intact. Face, tongue, palate moves normally and symmetrically.  Motor: Normal bulk and tone. Normal strength in all tested extremity muscles except mild left hip flexor weakness and decreased finger dexterity bilaterally Sensory.:  Intact light touch, vibratory and pinprick sensation Coordination: Rapid alternating movements normal in all extremities except decreased upper extremities bilaterally. Finger-to-nose and heel-to-shin performed accurately bilaterally.  Mild action tremors bilateral upper extremities.  No tremors at rest. Gait and Station: Arises from chair without difficulty. Stance is normal. Gait demonstrates normal stride length and mild unsteadiness/imbalance with use of Rollator walker. Reflexes: 1+ and symmetric. Toes downgoing.        ASSESSMENT/PLAN: 79 year old Caucasian lady with recent embolic right cerebellar infarct secondary to known AF on Eliquis on 01/09/2020 and history of right MCA infarct in February 2020 secondary to paroxysmal atrial fibrillation which was found  on outpatient cardiac event monitor after discharge.  Vascular risk factors of hypertension, hyperlipidemia, A. Fib s/p cardioversion 01/08/2020 and age.      Right cerebellar stroke secondary to A. Fib -Residual deficit: "whooshy" head feeling, gait unsteadiness/imbalance and dysarthria.  Denies residual vertigo or dizziness.  Referral placed to outpatient PT/OT/SLP as recommended at hospital discharge -Eliquis discontinued and initiated Pradaxa prior to discharge -Continue Pradaxa and atorvastatin 80 mg daily for secondary stroke prevention -Continue to follow with cardiology for atrial fibrillation and Pradaxa management -Discussed secondary stroke prevention measures and importance of close PCP and cardiology follow-up for aggressive stroke risk factor management including HTN with BP<130/90 and HLD with LDL goal<70.  Hx of Right MCA stroke -Residual deficit: Intermittent left-sided numbness/tingling -See above for stroke prevention measures  At risk for sleep apnea -Referral placed to [place] sleep clinic to evaluate for possible underlying sleep apnea -Subjective complaints: Nocturia, insomnia, daytime fatigue and snoring -Comorbidities: Atrial fibrillation with unsuccessful cardioversion, reoccurring embolic strokes, HTN and HLD    Follow-up in 3 months or call earlier as needed   I spent 45 minutes of face-to-face and non-face-to-face time with patient and husband.  This included previsit chart review including recent hospitalization pertinent progress notes, lab work and imaging, lab review, study review, order entry, electronic health record documentation, patient education regarding prior stroke with residual deficits, poststroke  anxiety with adjustment disorder, importance of ongoing management of stroke risk factors and answered all questions to patient and husband satisfaction   Frann Rider, AGNP-BC  [facility] [address] [place], [place] [address]  Phone [phone] Fax [phone] Note: This document was prepared with digital dictation and possible smart phrase technology. Any transcriptional errors that result from this process are unintentional.

## 2020-02-03 NOTE — Patient Instructions (Signed)
Referral placed to neuro rehab PT/OT/SLP for residual stroke deficits and likely ongoing improvement  Referral placed to [place] sleep clinic to further evaluate possible sleep apnea with increased risk with history of atrial fibrillation and reoccurring strokes  Continue Pradaxa (dabigatran) twice a day  and atorvastatin 80 mg daily for secondary stroke prevention  Continue to follow up with PCP regarding cholesterol and blood pressure management  Continue to follow with cardiology for atrial fibrillation and Pradaxa management  Maintain strict control of hypertension with blood pressure goal below 130/90 and cholesterol with LDL cholesterol (bad cholesterol) goal below 70 mg/dL.       Followup in the future with me in 3 months or call earlier if needed       Thank you for coming to see [place] at [facility]. I hope we have been able to provide you high quality care today.  You may receive a patient satisfaction survey over the next few weeks. We would appreciate your feedback and comments so that we may continue to improve ourselves and the health of our patients.    Sleep Apnea Sleep apnea is a condition in which breathing pauses or becomes shallow during sleep. Episodes of sleep apnea usually last 10 seconds or longer, and they may occur as many as 20 times an hour. Sleep apnea disrupts your sleep and keeps your body from getting the rest that it needs. This condition can increase your risk of certain health problems, including:  Heart attack.  Stroke.  Obesity.  Diabetes.  Heart failure.  Irregular heartbeat. What are the causes? There are three kinds of sleep apnea:  Obstructive sleep apnea. This kind is caused by a blocked or collapsed airway.  Central sleep apnea. This kind happens when the part of the brain that controls breathing does not send the correct signals to the muscles that control breathing.  Mixed sleep apnea. This is a combination of  obstructive and central sleep apnea. The most common cause of this condition is a collapsed or blocked airway. An airway can collapse or become blocked if:  Your throat muscles are abnormally relaxed.  Your tongue and tonsils are larger than normal.  You are overweight.  Your airway is smaller than normal. What increases the risk? You are more likely to develop this condition if you:  Are overweight.  Smoke.  Have a smaller than normal airway.  Are elderly.  Are female.  Drink alcohol.  Take sedatives or tranquilizers.  Have a family history of sleep apnea. What are the signs or symptoms? Symptoms of this condition include:  Trouble staying asleep.  Daytime sleepiness and tiredness.  Irritability.  Loud snoring.  Morning headaches.  Trouble concentrating.  Forgetfulness.  Decreased interest in sex.  Unexplained sleepiness.  Mood swings.  Personality changes.  Feelings of depression.  Waking up often during the night to urinate.  Dry mouth.  Sore throat. How is this diagnosed? This condition may be diagnosed with:  A medical history.  A physical exam.  A series of tests that are done while you are sleeping (sleep study). These tests are usually done in a sleep lab, but they may also be done at home. How is this treated? Treatment for this condition aims to restore normal breathing and to ease symptoms during sleep. It may involve managing health issues that can affect breathing, such as high blood pressure or obesity. Treatment may include:  Sleeping on your side.  Using a decongestant if you have nasal  congestion.  Avoiding the use of depressants, including alcohol, sedatives, and narcotics.  Losing weight if you are overweight.  Making changes to your diet.  Quitting smoking.  Using a device to open your airway while you sleep, such as: ? An oral appliance. This is a custom-made mouthpiece that shifts your lower jaw forward. ? A  continuous positive airway pressure (CPAP) device. This device blows air through a mask when you breathe out (exhale). ? A nasal expiratory positive airway pressure (EPAP) device. This device has valves that you put into each nostril. ? A bi-level positive airway pressure (BPAP) device. This device blows air through a mask when you breathe in (inhale) and breathe out (exhale).  Having surgery if other treatments do not work. During surgery, excess tissue is removed to create a wider airway. It is important to get treatment for sleep apnea. Without treatment, this condition can lead to:  High blood pressure.  Coronary artery disease.  In men, an inability to achieve or maintain an erection (impotence).  Reduced thinking abilities. Follow these instructions at home: Lifestyle  Make any lifestyle changes that your health care provider recommends.  Eat a healthy, well-balanced diet.  Take steps to lose weight if you are overweight.  Avoid using depressants, including alcohol, sedatives, and narcotics.  Do not use any products that contain nicotine or tobacco, such as cigarettes, e-cigarettes, and chewing tobacco. If you need help quitting, ask your health care provider. General instructions  Take over-the-counter and prescription medicines only as told by your health care provider.  If you were given a device to open your airway while you sleep, use it only as told by your health care provider.  If you are having surgery, make sure to tell your health care provider you have sleep apnea. You may need to bring your device with you.  Keep all follow-up visits as told by your health care provider. This is important. Contact a health care provider if:  The device that you received to open your airway during sleep is uncomfortable or does not seem to be working.  Your symptoms do not improve.  Your symptoms get worse. Get help right away if:  You develop: ? Chest pain. ? Shortness  of breath. ? Discomfort in your back, arms, or stomach.  You have: ? Trouble speaking. ? Weakness on one side of your body. ? Drooping in your face. These symptoms may represent a serious problem that is an emergency. Do not wait to see if the symptoms will go away. Get medical help right away. Call your local emergency services (911 in the [place] Do not drive yourself to the hospital. Summary  Sleep apnea is a condition in which breathing pauses or becomes shallow during sleep.  The most common cause is a collapsed or blocked airway.  The goal of treatment is to restore normal breathing and to ease symptoms during sleep. This information is not intended to replace advice given to you by your health care provider. Make sure you discuss any questions you have with your health care provider. Document Revised: 09/26/2018 Document Reviewed: 12/05/2017 Elsevier Patient Education  [employer].

## 2020-02-05 ENCOUNTER — Encounter: Payer: Medicare Other | Admitting: Registered Nurse

## 2020-02-05 ENCOUNTER — Ambulatory Visit (INDEPENDENT_AMBULATORY_CARE_PROVIDER_SITE_OTHER): Payer: Medicare Other | Admitting: Family Medicine

## 2020-02-05 ENCOUNTER — Encounter: Payer: Self-pay | Admitting: Family Medicine

## 2020-02-05 ENCOUNTER — Other Ambulatory Visit: Payer: Self-pay

## 2020-02-05 VITALS — BP 144/76 | HR 118 | Temp 98.5°F | Ht 65.0 in | Wt 134.2 lb

## 2020-02-05 DIAGNOSIS — I63441 Cerebral infarction due to embolism of right cerebellar artery: Secondary | ICD-10-CM | POA: Diagnosis not present

## 2020-02-05 DIAGNOSIS — R350 Frequency of micturition: Secondary | ICD-10-CM | POA: Diagnosis not present

## 2020-02-05 DIAGNOSIS — Z23 Encounter for immunization: Secondary | ICD-10-CM

## 2020-02-05 DIAGNOSIS — I69354 Hemiplegia and hemiparesis following cerebral infarction affecting left non-dominant side: Secondary | ICD-10-CM | POA: Insufficient documentation

## 2020-02-05 DIAGNOSIS — I480 Paroxysmal atrial fibrillation: Secondary | ICD-10-CM | POA: Diagnosis not present

## 2020-02-05 DIAGNOSIS — N390 Urinary tract infection, site not specified: Secondary | ICD-10-CM

## 2020-02-05 DIAGNOSIS — I10 Essential (primary) hypertension: Secondary | ICD-10-CM

## 2020-02-05 LAB — POCT URINALYSIS DIPSTICK
Bilirubin, UA: NEGATIVE
Blood, UA: POSITIVE
Glucose, UA: NEGATIVE
Ketones, UA: POSITIVE
Leukocytes, UA: NEGATIVE
Nitrite, UA: NEGATIVE
Protein, UA: POSITIVE — AB
Spec Grav, UA: 1.015 (ref 1.010–1.025)
Urobilinogen, UA: 0.2 E.U./dL
pH, UA: 6 (ref 5.0–8.0)

## 2020-02-05 MED ORDER — DILTIAZEM HCL ER COATED BEADS 180 MG PO CP24: 180.0000 mg | ORAL_CAPSULE | Freq: Every day | ORAL | 0 refills | Status: DC

## 2020-02-05 MED ORDER — NITROFURANTOIN MONOHYD MACRO 100 MG PO CAPS: 100.0000 mg | ORAL_CAPSULE | Freq: Two times a day (BID) | ORAL | 0 refills | Status: DC

## 2020-02-05 NOTE — Progress Notes (Signed)
   Subjective:    Patient ID: Mary Mercado, female    DOB: 01/18/1941, 79 y.o.   MRN: [id number]  HPI Here with her husband to follow up after a recent stroke and with new issues. 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 She presented with severe dizziness, slurred speech, and nausea and vomiting. This was the day after she had a cardioversion for atrial fibrillation. She was stabilized in the hospital, and she was switched from Eliquis to Pradaxa. She then had a rehab stay getting PT, OT, and ST until she went home on 01-22-20. Since then she has felt reasonably well, but she does mention 5 days of urinary pressure and urgency. No back pain or fever. She saw Neurology on 02-03-20, and she will see Cardiology on 02-07-20. Her BP at home has been running  High with systolic readings in the 297L and 150s, and her pulse has been in the 140s frequently. No chest pain or SOB.    Review of Systems  Constitutional: Negative.   Respiratory: Negative.   Cardiovascular: Positive for palpitations. Negative for chest pain and leg swelling.  Genitourinary: Positive for dysuria, frequency and urgency.       Objective:   Physical Exam Constitutional:      Comments: Walking with a walker   Cardiovascular:     Rate and Rhythm: Tachycardia present. Rhythm irregular.     Pulses: Normal pulses.     Heart sounds: Normal heart sounds.  Pulmonary:     Effort: Pulmonary effort is normal.     Breath sounds: Normal breath sounds.  Neurological:     Mental Status: She is alert.     Comments: Her speech is normal            Assessment & Plan:  She is recovering from a cerebellar stroke. She will soon be resuming PT and OT. She remains in atrial fibrillation and she will stay on Pradaxa. Since her HR and BP is high, we will increase the Diltiazem CD to 180 mg daily. She will see Cardiology in 2 days. She has a UTI so we will treat this with Macrobid. The urine  sample will be sent for a culture. She is given a flu shot today. [name], MD

## 2020-02-05 NOTE — Addendum Note (Signed)
Addended by: Matilde Sprang on: 02/05/2020 03:39 PM   Modules accepted: Orders

## 2020-02-05 NOTE — Progress Notes (Signed)
I agree with the above plan 

## 2020-02-06 ENCOUNTER — Encounter: Payer: Self-pay | Admitting: Cardiovascular Disease

## 2020-02-06 ENCOUNTER — Inpatient Hospital Stay: Payer: Medicare Other | Admitting: Registered Nurse

## 2020-02-06 LAB — URINE CULTURE
MICRO NUMBER:: 11067057
SPECIMEN QUALITY:: ADEQUATE

## 2020-02-06 NOTE — Progress Notes (Signed)
Cardiology Office Note:    Date:  02/07/2020   ID:  Mary Mercado Mary, Mercado 06-23-1940, MRN [id number]  PCP:  [name], MD  Cardiologist:  Mertie Moores, MD  Electrophysiologist:  None   Referring MD: [name], MD    Problem List 1. Atrial Fib:    CHADS2VASC =  5   ( female, age 79, CVA Chief Complaint  Patient presents with  . Atrial Fibrillation    Aug. 7, 2020     Mary Mercado is a 79 y.o. female with a hx of rapid atrial fib.    We cardioverted her on July 7. Is doing great.   Is having some dizziness.  Constant.  Not related to orthostatic.  Is not dizzy when she is lying down      Thinks its due to the metoprolol   Nov. 9, 2020  Madicyn is seen today for follow up of her atrial fib Doing well No cp Breathing is ok Has some left lower rib soreness.  Has lots of dizziness.   Especially after starting the telmisartan .    Reviewed her BP log  No readings below 100 .   Most blood pressure readings since starting the telmisartan are in the normal range.  Her blood pressure was mildly elevated before starting telmisartan.  August 09, 2019: Mary Mercado is seen today for follow-up of her atrial fibrillation.  She had a successful cardioversion July, 2020.  She remains in normal sinus rhythm. Dizziness has improved.   Having back pain - is on ultram   Has the sensation of quivering inside - she does not think its a HR irregularity   Sept. 9, 2021:   Seen for follow up . Has PAF - is in atrial fib today  Has had palpitations for the past several weeks.  Has been on eliquis without interruption.  Tried some Dilt 30 mg tabks Having some chest twinges  -  Oct. 15, 2021: Pt has hx of persistent AF. Was cardioverted Sept. 15. Was admitted with CVA on Sept. 16.  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 She had some tachycardia on Oct. 9.  Called the weekend on call PA .  Mary Mercado, [place] recommended additional Diltiazem. ( she would later be diagnosed with a UTI which may have contributed to her tachycardia )  Eliquis had been switched to Pradaxa.  She saw her Primary MD ( Dr. Sarajane Jews) on Oct. 13.  He increased the Diltiazem to 180 mg a day   We discussed afib clinic to consider alternatives antiarrhythmics and also to consider Afib ablation .  We discussed doing a TEE prior to any other cardioversion attempts.    Past Medical History:  Diagnosis Date  . Allergy   . CVA (cerebral vascular accident) (Mary Mercado)   . Hyperlipidemia LDL goal <70   . Hypertension   . Palpitations   . Vertigo, benign positional     Past Surgical History:  Procedure Laterality Date  . CARDIOVERSION Left 10/31/2018   Procedure: CARDIOVERSION;  Surgeon: Acie Fredrickson Wonda Cheng, MD;  Location: [facility];  Service: Cardiovascular;  Laterality: Left;  . CARDIOVERSION N/A 01/08/2020   Procedure: CARDIOVERSION;  Surgeon: [name], MD;  Location:  [facility] ENDOSCOPY;  Service: Cardiovascular;  Laterality: N/A;  . Hemilaminectomy and microdiskectomy at L4-5 on the left.  12/10/2003  . TEE WITHOUT CARDIOVERSION N/A 06/05/2018   Procedure: TRANSESOPHAGEAL ECHOCARDIOGRAM (TEE);  Surgeon: Lelon Perla, MD;  Location: [facility] ENDOSCOPY;  Service: Cardiovascular;  Laterality: N/A;  loop    Current Medications: Current Meds  Medication Sig  . atorvastatin (LIPITOR) 80 MG tablet Take 1 tablet (80 mg total) by mouth daily at 6 PM.  . dabigatran (PRADAXA) 150 MG CAPS capsule Take 1 capsule (150 mg total) by mouth every 12 (twelve) hours.  Marland Kitchen diltiazem (CARDIZEM) 30 MG tablet Take 1 tablet (30 mg total) by mouth 4 (four) times daily as needed. Prn  . nitrofurantoin, macrocrystal-monohydrate, (MACROBID) 100 MG capsule  Take 1 capsule (100 mg total) by mouth 2 (two) times daily.  . potassium chloride (KLOR-CON) 10 MEQ tablet Take 1 tablet (10 mEq total) by mouth daily.  . [DISCONTINUED] diltiazem (CARDIZEM CD) 180 MG 24 hr capsule Take 1 capsule (180 mg total) by mouth daily.  . [DISCONTINUED] diltiazem (CARDIZEM) 30 MG tablet Take 30 mg by mouth 4 (four) times daily. Prn  . [DISCONTINUED] flecainide (TAMBOCOR) 50 MG tablet Take 1 tablet (50 mg total) by mouth 2 (two) times daily.     Allergies:   Duloxetine and Acetaminophen   Social History   Socioeconomic History  . Marital status: Married    Spouse name: Not on file  . Number of children: Not on file  . Years of education: Not on file  . Highest education level: Not on file  Occupational History  . Not on file  Tobacco Use  . Smoking status: Never Smoker  . Smokeless tobacco: Never Used  Vaping Use  . Vaping Use: Never used  Substance and Sexual Activity  . Alcohol use: No  . Drug use: No  . Sexual activity: Not on file  Other Topics Concern  . Not on file  Social History Narrative  . Not on file   Social Determinants of Health   Financial Resource Strain:   . Difficulty of Paying Living Expenses: Not on file  Food Insecurity:   . Worried About Charity fundraiser in the Last Year: Not on file  . Ran Out of Food in the Last Year: Not on file  Transportation Needs:   . Lack of Transportation (Medical): Not on file  . Lack of Transportation (Non-Medical): Not on file  Physical Activity:   . Days of Exercise per Week: Not on file  . Minutes of Exercise per Session: Not on file  Stress:   . Feeling of Stress : Not on file  Social Connections:   . Frequency of Communication with Friends and Family: Not on file  . Frequency of Social Gatherings with Friends and Family: Not on file  . Attends Religious Services: Not on file  . Active Member of Clubs or Organizations: Not on file  . Attends Archivist Meetings: Not on file   . Marital Status: Not on file     Family History: The patient's family history includes Colon cancer in her father; Diabetes in her mother; Heart attack in her mother; Heart attack (age of onset: 90) in her brother.  ROS:   Please see the history of present illness.     All other systems reviewed and are negative.  EKGs/Labs/Other Studies Reviewed:    The following studies were reviewed today:  Recent Labs: 11/14/2019: TSH 1.48 01/14/2020: Magnesium  1.8 01/15/2020: ALT 20 01/20/2020: BUN 9; Creatinine, Ser 0.63; Hemoglobin 14.0; Platelets 247; Potassium 4.1; Sodium 139  Recent Lipid Panel    Component Value Date/Time   CHOL 183 01/10/2020 0550   TRIG 52 01/10/2020 0550   HDL 92 01/10/2020 0550   CHOLHDL 2.0 01/10/2020 0550   VLDL 10 01/10/2020 0550   LDLCALC 81 01/10/2020 0550   LDLCALC 90 11/14/2019 1102     Physical Exam: Blood pressure 128/72, pulse (!) 115, height 5\' 5"  (1.651 m), weight 134 lb (60.8 kg), SpO2 100 %.  GEN:  Well nourished, well developed in no acute distress HEENT: Normal NECK: No JVD; No carotid bruits LYMPHATICS: No lymphadenopathy CARDIAC: irreg. Irreg.  ,  Tachycardic  RESPIRATORY:  Clear to auscultation without rales, wheezing or rhonchi  ABDOMEN: Soft, non-tender, non-distended MUSCULOSKELETAL:  No edema; No deformity  SKIN: Warm and dry NEUROLOGIC:  Alert and oriented x 3,  Slight memory problems,  Weakness on left side   EKG:    February 07, 2020: Atrial fibrillation with a rapid ventricular response at 115.  Nonspecific ST and T wave changes.  ASSESSMENT:    1. Acute CVA (cerebrovascular accident) ([place])   2. Paroxysmal atrial fibrillation ([place])   3. SVT (supraventricular tachycardia) ([place])   4. Essential hypertension   5. Persistent atrial fibrillation ([place])    PLAN:      1.  Atrial fibrillation:  She is back on Afib with RVR. She had a successful cardioversion within the next day had a stroke while in our nuclear medicine  office. She is admitted to the hospital.  She was found to have recurrent atrial fibrillation.  She remains in atrial fibrillation today.  Her heart rate remains fast.  It is clear that the low-dose flecainide is not working to keep her in normal rhythm.  We will refer her to the A. fib clinic to consider other medications and also to consider possible ablation.  Her Eliquis has been changed to Pradaxa.   2.  Dizziness:   Stable .        Medication Adjustments/Labs and Tests Ordered: Current medicines are reviewed at length with the patient today.  Concerns regarding medicines are outlined above.  Orders Placed This Encounter  Procedures  . Amb Referral to AFIB Clinic  . EKG 12-Lead   Meds ordered this encounter  Medications  . diltiazem (CARDIZEM CD) 240 MG 24 hr capsule    Sig: Take 1 capsule (240 mg total) by mouth daily.    Dispense:  90 capsule    Refill:  3  . diltiazem (CARDIZEM) 30 MG tablet    Sig: Take 1 tablet (30 mg total) by mouth 4 (four) times daily as needed. Prn    Dispense:  120 tablet    Refill:  11     Patient Instructions  Medication Instructions:  Your physician has recommended you make the following change in your medication:  STOP Flecainide INCREASE Diltiazem to 240 mg daily  *If you need a refill on your cardiac medications before your next appointment, please call your pharmacy*   Lab Work: None Ordered If you have labs (blood work) drawn today and your tests are completely normal, you will receive your results only by: Marland Kitchen MyChart Message (if you have MyChart) OR . A paper copy in the mail If you have any lab test that is abnormal or we need to change your treatment, we will call you to review the results.   Testing/Procedures: None  Ordered   Follow-Up: At [facility], you and your health needs are our priority.  As part of our continuing mission to provide you with exceptional heart care, we have created designated Provider Care  Teams.  These Care Teams include your primary Cardiologist (physician) and Advanced Practice Providers (APPs -  Physician Assistants and Nurse Practitioners) who all work together to provide you with the care you need, when you need it.   Your next appointment:   6 month(s)  The format for your next appointment:   In Person  Provider:   Mertie Moores, MD   Other Instructions You have been referred to A Fib Clinic at [place], Mertie Moores, MD  02/07/2020 12:25 PM    [place]y Rose

## 2020-02-07 ENCOUNTER — Encounter: Payer: Self-pay | Admitting: Physical Medicine & Rehabilitation

## 2020-02-07 ENCOUNTER — Other Ambulatory Visit: Payer: Self-pay

## 2020-02-07 ENCOUNTER — Encounter: Payer: Medicare Other | Attending: Physical Medicine & Rehabilitation | Admitting: Physical Medicine & Rehabilitation

## 2020-02-07 ENCOUNTER — Ambulatory Visit (INDEPENDENT_AMBULATORY_CARE_PROVIDER_SITE_OTHER): Payer: Medicare Other | Admitting: Cardiovascular Disease

## 2020-02-07 ENCOUNTER — Encounter: Payer: Self-pay | Admitting: Cardiovascular Disease

## 2020-02-07 VITALS — BP 128/72 | HR 115 | Ht 65.0 in | Wt 134.0 lb

## 2020-02-07 VITALS — BP 129/78 | HR 88 | Temp 98.0°F | Ht 65.0 in | Wt 134.8 lb

## 2020-02-07 DIAGNOSIS — I639 Cerebral infarction, unspecified: Secondary | ICD-10-CM | POA: Diagnosis not present

## 2020-02-07 DIAGNOSIS — I10 Essential (primary) hypertension: Secondary | ICD-10-CM | POA: Diagnosis not present

## 2020-02-07 DIAGNOSIS — I6502 Occlusion and stenosis of left vertebral artery: Secondary | ICD-10-CM

## 2020-02-07 DIAGNOSIS — I63511 Cerebral infarction due to unspecified occlusion or stenosis of right middle cerebral artery: Secondary | ICD-10-CM | POA: Insufficient documentation

## 2020-02-07 DIAGNOSIS — I480 Paroxysmal atrial fibrillation: Secondary | ICD-10-CM | POA: Diagnosis not present

## 2020-02-07 DIAGNOSIS — I69398 Other sequelae of cerebral infarction: Secondary | ICD-10-CM | POA: Diagnosis not present

## 2020-02-07 DIAGNOSIS — I471 Supraventricular tachycardia, unspecified: Secondary | ICD-10-CM

## 2020-02-07 DIAGNOSIS — I69319 Unspecified symptoms and signs involving cognitive functions following cerebral infarction: Secondary | ICD-10-CM | POA: Insufficient documentation

## 2020-02-07 DIAGNOSIS — I4819 Other persistent atrial fibrillation: Secondary | ICD-10-CM | POA: Diagnosis not present

## 2020-02-07 DIAGNOSIS — R269 Unspecified abnormalities of gait and mobility: Secondary | ICD-10-CM

## 2020-02-07 MED ORDER — DILTIAZEM HCL 30 MG PO TABS
30.0000 mg | ORAL_TABLET | Freq: Four times a day (QID) | ORAL | 11 refills | Status: DC | PRN
Start: 2020-02-07 — End: 2020-06-29

## 2020-02-07 MED ORDER — DILTIAZEM HCL ER COATED BEADS 240 MG PO CP24: 240.0000 mg | ORAL_CAPSULE | Freq: Every day | ORAL | 3 refills | Status: DC

## 2020-02-07 NOTE — Patient Instructions (Signed)
Medication Instructions:  Your physician has recommended you make the following change in your medication:  STOP Flecainide INCREASE Diltiazem to 240 mg daily  *If you need a refill on your cardiac medications before your next appointment, please call your pharmacy*   Lab Work: None Ordered If you have labs (blood work) drawn today and your tests are completely normal, you will receive your results only by: Marland Kitchen MyChart Message (if you have MyChart) OR . A paper copy in the mail If you have any lab test that is abnormal or we need to change your treatment, we will call you to review the results.   Testing/Procedures: None Ordered   Follow-Up: At [facility], you and your health needs are our priority.  As part of our continuing mission to provide you with exceptional heart care, we have created designated Provider Care Teams.  These Care Teams include your primary Cardiologist (physician) and Advanced Practice Providers (APPs -  Physician Assistants and Nurse Practitioners) who all work together to provide you with the care you need, when you need it.   Your next appointment:   6 month(s)  The format for your next appointment:   In Person  Provider:   Mertie Moores, MD   Other Instructions You have been referred to A Fib Clinic at [facility]

## 2020-02-07 NOTE — Patient Instructions (Signed)
Please walk outside everyday weather permitting, start out with 4min per day and increase 86min per week with goal of 107min

## 2020-02-07 NOTE — Progress Notes (Signed)
Subjective:    Patient ID: Mary Mercado, female    DOB: December 29, 1940, 79 y.o.   MRN: [id number]   79 y.o. Rh-female with history of CAD, right-CVA, PAF s/p DCCV recently; who was admitted on 01/09/2020 with acute onset of dizziness, slurred speech and nausea and vomiting while at cardiologist office.  CTA head/neck showed moderate to severe stenosis left HP and moderate stenosis right P2 segment.  MRI brain showed acute 3 mm infarct in right-ACA and evolution of prior right-MCA infarct.  2D echo showed EF 60 to 65% with biatrial dilatation, moderate PAH and mild to moderate aortic valve sclerosis.  Stroke was felt to be embolic due to A. fib and Eliquis was changed to Pradaxa per Dr. Clydene Fake input.  Therapy evaluations done revealing mild dysarthria with mild memory deficits, vestibular symptoms as well as balance deficits affecting ADLs and mobility.  CIR was recommended due to functional decline. She has had improvement in functional use RUE  and RLE as well as improvement in awareness. She is able to complete ADL tasks at modified independent level.  She requires supervision with mobility and Berg balance score has improved to 48/56.  She is able to use speech strategies at modified independent level and speech intelligibility has improved.    Admit date: 01/14/2020 Discharge date: 01/22/2020  HPI Needed med adjustment for Afib per cardiology, increased diltiazem and d/c flecainide The patient has been seen by neurology.  Has been converted in the hospital from Eliquis to Pradaxa No falls at home.  The patient has been ambulating with a Rollator modified independent.  She is not ambulating outside the home.  She was like to do that.  She feels like her right upper extremity coordination is still poor.  She has difficulty using a keyboard and has difficulty writing with the right hand. She still has some residual left-handed fine motor and strength deficits from prior stroke The patient  lives with her husband who drives her to appointments. Pain Inventory Average Pain 0 Pain Right Now 0 My pain is numbnes in left arm and left hand.  LOCATION OF PAIN  Left leg, hand & arm feels like a block of wood.   BOWEL Number of stools per week: 6 Oral laxative use No  Type of laxative :None Enema or suppository use No  History of colostomy No  Incontinent No   BLADDER Normal In and out cath, frequency N/A Able to self cath No  Bladder incontinence No  Frequent urination No  Leakage with coughing No  Difficulty starting stream No  Incomplete bladder emptying No   MRI HEAD WITHOUT CONTRAST  TECHNIQUE: Multiplanar, multiecho pulse sequences of the brain and surrounding structures were obtained without intravenous contrast.  COMPARISON:  CT head and CTA head and neck earlier today. Brain MRI 06/01/2018.  FINDINGS: Brain: Patchy 3 cm area of restricted diffusion in the right superior cerebellar artery territory (series 3, image 14). Minimal T2 and FLAIR hyperintensity. No associated hemorrhage or mass effect.  Superimposed chronic microhemorrhage elsewhere in the right cerebellar hemisphere. No other convincing posterior fossa restricted diffusion.  Expected evolution of the posterior right frontal lobe/posterior operculum infarct last year with encephalomalacia there now. No supratentorial restricted diffusion today.  Elsewhere stable gray and white matter signal including Patchy and confluent bilateral cerebral white matter and pontine T2 and FLAIR hyperintensity.-no other chronic cerebral blood products.  No midline shift, mass effect, evidence of mass lesion, ventriculomegaly, extra-axial collection or acute intracranial hemorrhage. Cervicomedullary  junction and pituitary are within normal limits.  Vascular: Major intracranial vascular flow voids are stable since last year.  Skull and upper cervical spine: Negative for age visible  cervical spine. Visualized bone marrow signal is within normal limits.  Sinuses/Orbits: Stable, negative.  Other: Mastoids remain clear. Grossly stable and normal visible internal auditory structures. Visible scalp and face appear negative.  IMPRESSION: 1. Acute 3 cm infarct in the Right Superior Cerebellar Artery territory. No associated hemorrhage or mass effect.  2. Expected evolution of the right MCA infarcts last year. Otherwise stable underlying chronic small vessel disease.   Electronically Signed   By: Genevie Ann M.D.   On: 01/09/2020 15:42 Mobility use a walker how many minutes can you walk? Mabe 5 mins ability to climb steps?  no do you drive?  no Do you have any goals in this area?  yes  Function retired I need assistance with the following:  meal prep, household duties and shopping Do you have any goals in this area?  yes  Neuro/Psych weakness numbness tremor trouble walking dizziness  Prior Studies Hospital follow up discharged on 01/13/2020.   Physicians involved in your care Hospital follow up discharged on 01/13/2020.    Family History  Problem Relation Age of Onset  . Colon cancer Father   . Diabetes Mother   . Heart attack Mother        62  . Heart attack Brother 63   Social History   Socioeconomic History  . Marital status: Married    Spouse name: Not on file  . Number of children: Not on file  . Years of education: Not on file  . Highest education level: Not on file  Occupational History  . Not on file  Tobacco Use  . Smoking status: Never Smoker  . Smokeless tobacco: Never Used  Vaping Use  . Vaping Use: Never used  Substance and Sexual Activity  . Alcohol use: No  . Drug use: No  . Sexual activity: Not on file  Other Topics Concern  . Not on file  Social History Narrative  . Not on file   Social Determinants of Health   Financial Resource Strain:   . Difficulty of Paying Living Expenses: Not on file  Food  Insecurity:   . Worried About Charity fundraiser in the Last Year: Not on file  . Ran Out of Food in the Last Year: Not on file  Transportation Needs:   . Lack of Transportation (Medical): Not on file  . Lack of Transportation (Non-Medical): Not on file  Physical Activity:   . Days of Exercise per Week: Not on file  . Minutes of Exercise per Session: Not on file  Stress:   . Feeling of Stress : Not on file  Social Connections:   . Frequency of Communication with Friends and Family: Not on file  . Frequency of Social Gatherings with Friends and Family: Not on file  . Attends Religious Services: Not on file  . Active Member of Clubs or Organizations: Not on file  . Attends Archivist Meetings: Not on file  . Marital Status: Not on file   Past Surgical History:  Procedure Laterality Date  . CARDIOVERSION Left 10/31/2018   Procedure: CARDIOVERSION;  Surgeon: Acie Fredrickson Wonda Cheng, MD;  Location: [place];  Service: Cardiovascular;  Laterality: Left;  . CARDIOVERSION N/A 01/08/2020   Procedure: CARDIOVERSION;  Surgeon: [name], MD;  Location: [place];  Service: Cardiovascular;  Laterality: N/A;  . Hemilaminectomy and microdiskectomy at L4-5 on the left.  12/10/2003  . TEE WITHOUT CARDIOVERSION N/A 06/05/2018   Procedure: TRANSESOPHAGEAL ECHOCARDIOGRAM (TEE);  Surgeon: Lelon Perla, MD;  Location: [facility] ENDOSCOPY;  Service: Cardiovascular;  Laterality: N/A;  loop   Past Medical History:  Diagnosis Date  . Allergy   . CVA (cerebral vascular accident) ([place])   . Hyperlipidemia LDL goal <70   . Hypertension   . Palpitations   . Vertigo, benign positional    BP 129/78   Pulse 88   Temp 98 F (36.7 C)   Ht 5\' 5"  (1.651 m)   Wt 134 lb 12.8 oz (61.1 kg)   SpO2 97%   BMI 22.43 kg/m   Opioid Risk Score:   Fall Risk Score:  `1  Depression screen PHQ 2/9  Depression screen [facility] 2/9 02/07/2020 09/20/2018 06/14/2017  Decreased Interest 2 0 0  Down, Depressed,  Hopeless 0 1 0  PHQ - 2 Score 2 1 0  Altered sleeping 1 - -  Tired, decreased energy 0 - -  Change in appetite 0 - -  Feeling bad or failure about yourself  0 - -  Trouble concentrating 0 - -  Moving slowly or fidgety/restless 2 - -  Suicidal thoughts 0 - -  PHQ-9 Score 5 - -   Review of Systems  Constitutional: Positive for fatigue.  Eyes: Negative.   Respiratory: Negative.   Cardiovascular: Negative.   Endocrine: Negative.   Genitourinary: Negative.   Musculoskeletal: Positive for gait problem.       Balance problem  Skin: Negative.   Neurological: Positive for dizziness, tremors, weakness and numbness.  Psychiatric/Behavioral:       Depression Anxiety  All other systems reviewed and are negative.      Objective:   Physical Exam Vitals and nursing note reviewed.  Constitutional:      Appearance: She is normal weight. She is not ill-appearing.  HENT:     Head: Normocephalic and atraumatic.  Eyes:     Extraocular Movements: Extraocular movements intact.     Conjunctiva/sclera: Conjunctivae normal.     Pupils: Pupils are equal, round, and reactive to light.  Cardiovascular:     Rate and Rhythm: Normal rate and regular rhythm.     Heart sounds: Normal heart sounds.  Pulmonary:     Effort: Pulmonary effort is normal. No respiratory distress.     Breath sounds: Normal breath sounds. No stridor.  Abdominal:     General: Abdomen is flat. Bowel sounds are normal. There is no distension.     Palpations: Abdomen is soft.  Neurological:     Mental Status: She is alert and oriented to person, place, and time.     Comments: Motor strength is 4/5 bilateral deltoid bicep tricep grip 4/5 bilateral hip flexors 5/5 knee extensors 4/5 bilateral ankle dorsiflexors. Sensation is mildly reduced to light touch in the left compared to the right upper extremity, no difference in temperature or pinprick sensation Tone is normal bilateral upper and lower limbs There is mild dysmetria  bilaterally finger-nose-finger Romberg is positive She is able to stand with her feet together with her eyes open Ambulates with a bone later no evidence of toe drag or knee instability gait does not appear wide-based there is no evidence of limb ataxia in the lower extremities.  Psychiatric:        Mood and Affect: Mood normal.        Behavior: Behavior  normal.       Assessment & Plan:  #1.  Recent right cerebellar infarct superimposed prior right MCA distribution infarct.  She had a very good recovery from the right MCA distribution infarct and was ambulating with an assistive device.  Would benefit from additional PT for gait training and to improve endurance, OT for right upper extremity fine motor skills and speech for high-level cognitive skills she is having some difficulty with bills etc. She already has a handicap sticker for her car Do not recommend driving at this time I will see her back in 6 weeks

## 2020-02-12 ENCOUNTER — Other Ambulatory Visit: Payer: Self-pay

## 2020-02-12 ENCOUNTER — Ambulatory Visit (HOSPITAL_COMMUNITY)
Admission: RE | Admit: 2020-02-12 | Discharge: 2020-02-12 | Disposition: A | Discharge status: Home / Self Care | Payer: Medicare Other | Source: Ambulatory Visit | Attending: Nurse Practitioner | Admitting: Nurse Practitioner

## 2020-02-12 VITALS — BP 117/75 | HR 165 | Ht 65.0 in | Wt 133.6 lb

## 2020-02-12 DIAGNOSIS — D6869 Other thrombophilia: Secondary | ICD-10-CM | POA: Diagnosis not present

## 2020-02-12 DIAGNOSIS — I4891 Unspecified atrial fibrillation: Secondary | ICD-10-CM | POA: Diagnosis not present

## 2020-02-12 DIAGNOSIS — Z8673 Personal history of transient ischemic attack (TIA), and cerebral infarction without residual deficits: Secondary | ICD-10-CM | POA: Insufficient documentation

## 2020-02-12 DIAGNOSIS — E785 Hyperlipidemia, unspecified: Secondary | ICD-10-CM | POA: Insufficient documentation

## 2020-02-12 DIAGNOSIS — I4819 Other persistent atrial fibrillation: Secondary | ICD-10-CM | POA: Insufficient documentation

## 2020-02-12 DIAGNOSIS — Z79899 Other long term (current) drug therapy: Secondary | ICD-10-CM | POA: Insufficient documentation

## 2020-02-13 ENCOUNTER — Other Ambulatory Visit: Payer: Self-pay | Admitting: Family Medicine

## 2020-02-13 ENCOUNTER — Other Ambulatory Visit: Payer: Self-pay | Admitting: Physical Medicine and Rehabilitation

## 2020-02-13 ENCOUNTER — Other Ambulatory Visit: Payer: Self-pay

## 2020-02-13 ENCOUNTER — Ambulatory Visit: Payer: Medicare Other | Attending: Physical Medicine & Rehabilitation

## 2020-02-13 DIAGNOSIS — R41844 Frontal lobe and executive function deficit: Secondary | ICD-10-CM | POA: Insufficient documentation

## 2020-02-13 DIAGNOSIS — R2689 Other abnormalities of gait and mobility: Secondary | ICD-10-CM

## 2020-02-13 DIAGNOSIS — R2681 Unsteadiness on feet: Secondary | ICD-10-CM | POA: Insufficient documentation

## 2020-02-13 DIAGNOSIS — R41842 Visuospatial deficit: Secondary | ICD-10-CM | POA: Insufficient documentation

## 2020-02-13 DIAGNOSIS — M6281 Muscle weakness (generalized): Secondary | ICD-10-CM | POA: Diagnosis not present

## 2020-02-13 DIAGNOSIS — R278 Other lack of coordination: Secondary | ICD-10-CM | POA: Diagnosis not present

## 2020-02-13 DIAGNOSIS — R41840 Attention and concentration deficit: Secondary | ICD-10-CM | POA: Diagnosis not present

## 2020-02-13 DIAGNOSIS — R208 Other disturbances of skin sensation: Secondary | ICD-10-CM | POA: Insufficient documentation

## 2020-02-13 NOTE — Therapy (Signed)
[facility] [address] [place], [place], [address] Phone: [phone]   Fax:  [phone]  Physical Therapy Evaluation  Patient Details  Name: Mary Mercado MRN: [id number] Date of Birth: [birth year] Referring Provider (PT): [name], [facility]   Encounter Date: 02/13/2020   PT End of Session - 02/13/20 1254    Visit Number 1    Number of Visits 17    Date for PT Re-Evaluation 05/13/20    Authorization Type Medicare (PN every 10th visit) and BCBS supplemental    PT Start Time 1147    PT Stop Time 1230    PT Time Calculation (min) 43 min    Equipment Utilized During Treatment Other (comment)   min guard to S prn   Activity Tolerance Patient tolerated treatment well;Treatment limited secondary to medical complications (Comment)   pt reported wooziness and dizziness and required seated rest breaks likely 2/2 a-fib.   Behavior During Therapy [facility] for tasks assessed/performed           Past Medical History:  Diagnosis Date  . Allergy   . CVA (cerebral vascular accident) ([place])   . Hyperlipidemia LDL goal <70   . Hypertension   . Palpitations   . Vertigo, benign positional     Past Surgical History:  Procedure Laterality Date  . CARDIOVERSION Left 10/31/2018   Procedure: CARDIOVERSION;  Surgeon: Acie Fredrickson Wonda Cheng, MD;  Location: [place];  Service: Cardiovascular;  Laterality: Left;  . CARDIOVERSION N/A 01/08/2020   Procedure: CARDIOVERSION;  Surgeon: [name], MD;  Location: [facility] ENDOSCOPY;  Service: Cardiovascular;  Laterality: N/A;  . Hemilaminectomy and microdiskectomy at L4-5 on the left.  12/10/2003  . TEE WITHOUT CARDIOVERSION N/A 06/05/2018   Procedure: TRANSESOPHAGEAL ECHOCARDIOGRAM (TEE);  Surgeon: Lelon Perla, MD;  Location: [facility] ENDOSCOPY;  Service: Cardiovascular;  Laterality: N/A;  loop    There were no vitals filed for this visit.    Subjective Assessment - 02/13/20 1157     Subjective Pt s/p 01/09/20 R MCA CVA. Pt uses rollator for balance since CVA. Pt is able to shower with husband in bathroom for S. Pt reports L sided weakness is a little worse since recent CVA (hx of CVA), pt has new onset of R hand tremor since recent CVA. Pt denied falls in last 6 months. Pt reports decr. "stamina" since recent CVA. Pt's difficulty with speaking and swallowing has improved.    Patient is accompained by: Family member   pt's husband-[name]   Pertinent History CAD, hx of CVA, PAF s/p DCCV, BPPV, a-fib (currently undergoing testing to stop a-fib but was told not a candidate for ablation and might have to live with a-fib), HLD    Patient Stated Goals To be able to feel good when I walk, improve endurance    Currently in Pain? No/denies              [facility] PT Assessment - 02/13/20 1204      Assessment   Medical Diagnosis R MCA CVA    Referring Provider (PT) [name], [facility]    Onset Date/Surgical Date 01/09/20    Hand Dominance Right    Prior Therapy For previous CVA and inpatient rehab with d/c on 01/22/20      Precautions   Precautions Fall    Precaution Comments a-fib and is undergoing testing/medication changes with cardiologist      Restrictions   Weight Bearing Restrictions No      Balance  Screen   Has the patient fallen in the past 6 months No    Has the patient had a decrease in activity level because of a fear of falling?  Yes    Is the patient reluctant to leave their home because of a fear of falling?  No      Home Environment   Living Environment Private residence    Living Arrangements Spouse/significant other    Available Help at Discharge Family    Type of [place] Access Level entry    [place] Two level;Able to live on main level with bedroom/bathroom    Alternate Level Stairs-Number of Steps 12    Alternate Level Stairs-Rails Right    Home Equipment Cane - single point;Shower seat;Walker - 4 wheels;Grab bars - tub/shower        Prior Function   Level of Independence Independent with household mobility without device    Vocation Retired    Leisure Love to sing, church choir, watch ball games      Cognition   Overall Cognitive Status Within Functional Limits for tasks assessed   pt states short-term memory impacted     Sensation   Light Touch Impaired by gross assessment    Additional Comments Pt reported L foot and hand intemittent N/T.      Coordination   Gross Motor Movements are Fluid and Coordinated No    Fine Motor Movements are Fluid and Coordinated No    Coordination and Movement Description decr. speed and ataxia noted on the L side. pt able to perform fingers to thumb (B) and RAMs WNL.      Tone   Assessment Location --   pt denied spasms.      ROM / Strength   AROM / PROM / Strength AROM;Strength      AROM   Overall AROM  Deficits    Overall AROM Comments BUE and BLE WFL except for decr. L ankle dorsiflexion      Strength   Overall Strength Deficits    Overall Strength Comments RLE: hip flex: 3+/5, knee flex: 4/4, knee ext: 4/5, ankle DF: 4/5. LLE: hip flex: 3+/5, knee flex: 3/5, knee ext: 4/5, ankle DF: 3/5. Seated abd/ddd RLE: 4/5 and LLE: 3/5 abd/add      Transfers   Transfers Sit to Stand;Stand to Sit    Sit to Stand 5: Supervision;Without upper extremity assist;From chair/3-in-1    Stand to Sit 5: Supervision;Without upper extremity assist;To chair/3-in-1      Ambulation/Gait   Ambulation/Gait Yes    Ambulation/Gait Assistance 5: Supervision    Ambulation/Gait Assistance Details S for safety with rollator. Pt required seated rest break 2/2 wooziness and dizziness.     Ambulation Distance (Feet) 75 Feet    Assistive device Rollator    Gait Pattern Step-through pattern;Decreased stride length    Ambulation Surface Level;Indoor    Gait velocity 2.55ft/sec       Balance   Balance Assessed Yes      Static Standing Balance   Static Standing - Balance Support No upper extremity  supported    Static Standing - Level of Assistance 5: Stand by assistance    Static Standing Balance -  Activities  Single Leg Stance - Right Leg;Single Leg Stance - Left Leg;Romberg - Eyes Opened;Romberg - Eyes Closed    Static Standing - Comment/# of Minutes Feet apart with eyes closed with S for safety: 10 sec., R and L SLS: 4  sec./LE. feet together, 10 sec.       Standardized Balance Assessment   Standardized Balance Assessment Timed Up and Go Test      Timed Up and Go Test   TUG Normal TUG    Normal TUG (seconds) 18.16   with rollator                     Objective measurements completed on examination: See above findings.               PT Education - 02/13/20 1253    Education Details PT educated pt on outcome measure results, PT POC, frequency, and duration. PT also educated pt that MD recommended speech and OT evals, pt agreeable.    Person(s) Educated Patient;Spouse    Methods Explanation    Comprehension Verbalized understanding            PT Short Term Goals - 02/13/20 1304      PT SHORT TERM GOAL #1   Title Pt will be independent with HEP for improved strength, balance, dizziness, and gait.  TARGET DATE FOR ALL STGS: 03/12/20    Time 4    Period Weeks    Status New      PT SHORT TERM GOAL #2   Title Perform BERG and DGI writes goals as indicated.    Time 4    Period Weeks    Status New      PT SHORT TERM GOAL #3   Title Pt will improved gait speed to >/=2.23ft/sec. with LRAD to improve gait efficiency.    Baseline 2.1ft/sec with rollator    Time 4    Period Weeks    Status New      PT SHORT TERM GOAL #4   Title Pt will perform TUG with LRAD in </=13.5 sec. to decr. falls risk.    Baseline 18.16sec. with rollator    Time 4    Period Weeks    Status New      PT SHORT TERM GOAL #5   Title Pt will amb. 300' at MOD I level with LRAD over even terrain to improve functional mobility.    Time 4    Period Weeks    Status New               PT Long Term Goals - 02/13/20 1308      PT LONG TERM GOAL #1   Title Pt will verbalize CVA s/s to decr. risk of additional CVA. TARGET DATE FOR ALL LTGS: 04/09/20    Time 8    Period Weeks    Status New      PT LONG TERM GOAL #2   Title Pt will improve gait with LRAD to >/=2.70ft/sec. to safely amb. in the community.    Baseline 2.62ft/sec with rollator    Time 8    Period Weeks    Status New      PT LONG TERM GOAL #3   Title Pt will amb. 600' with LRAD at MOD I level oven even and uneven terrain to improve functional mobility.    Time 8    Period Weeks    Status New      PT LONG TERM GOAL #4   Title Perform vestibular assessment as indicated and write goals as needed.    Time 8    Period Weeks    Status New  Plan - 02/13/20 1256    Clinical Impression Statement Pt is a pleasant 79y/o female presenting to OPPT neuro s/p 01/09/20 R MCA CVA. Pt's PMH is significant for the following: CAD, hx of CVA, PAF s/p DCCV, BPPV, a-fib (currently undergoing testing to stop a-fib but was told not a candidate for ablation and might have to live with a-fib), HLD. Pt's TUG time indicated she is at risk for falls. Pt's gait speed with rollator indicated pt is not able to safely amb. in the community. Pt required min A to S during static standing balance activities to ensure safety. The following impairments were noted upon exam: gait deviations, impaired balance, decr. LE strength, decr. endurance, impaired sensation, and dizziness. Pt woud benefit from skilled PT to improve safety during functional mobility.    Personal Factors and Comorbidities Age;Comorbidity 3+    Comorbidities CAD, hx of CVA, PAF s/p DCCV, BPPV, a-fib (currently undergoing testing to stop a-fib but was told not a candidate for ablation and might have to live with a-fib), HLD    Examination-Activity Limitations Bed  Mobility;Bend;Bathing;Carry;Stand;Squat;Stairs;Toileting;Dressing;Transfers;Locomotion Level    Examination-Participation Restrictions Church;Yard Work;Driving;Community Activity;Laundry;Meal Prep;Cleaning    Stability/Clinical Decision Making Evolving/Moderate complexity    Clinical Decision Making Moderate   2/2 uncontrolled a-fib   Rehab Potential Good    PT Frequency 2x / week    PT Duration 8 weeks    PT Treatment/Interventions ADLs/Self Care Home Management;Aquatic Therapy;Biofeedback;Canalith Repostioning;Balance training;Therapeutic exercise;Manual techniques;Vestibular;Therapeutic activities;Functional mobility training;Stair training;Gait training;Orthotic Fit/Training;Patient/family education;DME Instruction;Neuromuscular re-education    PT Next Visit Plan Check vitals (uncontrolled a-fib), perform BERG and write goals, initiate OTAGO or HEP. Provide CVA ed handout.    Recommended Other Services Speech and OT evals    Consulted and Agree with Plan of Care Patient;Family member/caregiver    Family Member Consulted pt's husband: [name]           Patient will benefit from skilled therapeutic intervention in order to improve the following deficits and impairments:  Abnormal gait, Decreased endurance, Impaired sensation, Cardiopulmonary status limiting activity, Decreased knowledge of use of DME, Decreased activity tolerance, Decreased strength, Decreased balance, Decreased mobility, Dizziness, Impaired flexibility  Visit Diagnosis: Other abnormalities of gait and mobility - Plan: PT plan of care cert/re-cert  Muscle weakness (generalized) - Plan: PT plan of care cert/re-cert  Unsteadiness on feet - Plan: PT plan of care cert/re-cert  Other lack of coordination - Plan: PT plan of care cert/re-cert  Other disturbances of skin sensation - Plan: PT plan of care cert/re-cert     Problem List Patient Active Problem List   Diagnosis Date Noted  . Hemiplegia and hemiparesis  following cerebral infarction affecting left non-dominant side ([place]) 02/05/2020  . 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 CVA (cerebral vascular accident) ([place]) 01/09/2020  . Dizziness 11/30/2018  . Atrial fibrillation ([place]) 08/13/2018  . Prediabetes 06/15/2018  . Palpitations 06/05/2018  . Essential hypertension 06/05/2018  . Hyperlipidemia LDL goal <70 06/05/2018  . Acute ischemic right MCA stroke ([place]) 06/05/2018  . Stroke (cerebrum) ([place]) - R MCA s/p tPA, embolic, source unknown 58/12/9831  . SVT (supraventricular tachycardia) ([place]) 05/28/2015  . PVC's (premature ventricular contractions) 05/28/2015  . BENIGN POSITIONAL VERTIGO 06/16/2009  . ALLERGIC RHINITIS 06/16/2009    Zyon Grout L 02/13/2020, 1:11 PM  [facility] [address] [place], [place], [address] Phone: [phone]   Fax:  [phone]  Name: Shahad Mazurek MRN: [id number] Date of  Birth: [birth year]   Geoffry Paradise, PT,DPT 02/13/20 1:11 PM Phone: [phone] Fax: [phone]

## 2020-02-13 NOTE — Progress Notes (Signed)
Primary Care Physician: [name], MD Referring Physician: Dr. Wendall Mola Motsinger Mary is a 79 y.o. 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 A 30 day monitor was placed and she was found to have afib. Eliquis was started for a CHA2DS2VASc score of 5. She had return to afib in July 2020 and a successful cardioversion was performed. She then was in her usual state of health until September 2021 when she presented to Dr. Acie Fredrickson with afib with RVR. She was set up for cardioversion, which was successful, stated no known missed eliquis doses. She was started on flecainide and had a stress test the nest day. She developed stroke symptoms while at the [facility] street office and was sent directly to the [facility] ER as Code stroke.   She had f/u with Dr. Acie Fredrickson a few days ago and was in afib with RVR. He stopped flecainide. She was changed to Pradaxa after the last stroke.  She  was sent here to discuss options to return to SR. I do not believe she will an ablation candidate as she had a recent stroke and her rt and left  upper chambers are severely dilated. Her EKG showed v rate at 165 bpm. However, pulse ox worn during the visit, showed heart rates in the 100's. They are using 30 mg cardizem as needed to control v rates at home as well at her 240 mg daily. It appears that her best options to get back in rhythm would be amiodarone or tikosyn, but either way the pt and her husband are very leary re a repeat cardioversion. She would not be an ablation candidate for her recent stroke and enlarged atrium. She appears to be tolerating the afib ok.   Today, she denies symptoms of palpitations, chest pain, shortness of breath, orthopnea, PND, lower extremity edema, dizziness, presyncope, syncope, or neurologic sequela. The patient is tolerating medications without difficulties and is otherwise without complaint today.   Past Medical History:  Diagnosis Date  . Allergy   . CVA (cerebral  vascular accident) ([place])   . Hyperlipidemia LDL goal <70   . Hypertension   . Palpitations   . Vertigo, benign positional    Past Surgical History:  Procedure Laterality Date  . CARDIOVERSION Left 10/31/2018   Procedure: CARDIOVERSION;  Surgeon: Acie Fredrickson Wonda Cheng, MD;  Location: [place];  Service: Cardiovascular;  Laterality: Left;  . CARDIOVERSION N/A 01/08/2020   Procedure: CARDIOVERSION;  Surgeon: [name], MD;  Location: [facility] ENDOSCOPY;  Service: Cardiovascular;  Laterality: N/A;  . Hemilaminectomy and microdiskectomy at L4-5 on the left.  12/10/2003  . TEE WITHOUT CARDIOVERSION N/A 06/05/2018   Procedure: TRANSESOPHAGEAL ECHOCARDIOGRAM (TEE);  Surgeon: Lelon Perla, MD;  Location: [facility] ENDOSCOPY;  Service: Cardiovascular;  Laterality: N/A;  loop    Current Outpatient Medications  Medication Sig Dispense Refill  . atorvastatin (LIPITOR) 80 MG tablet Take 1 tablet (80 mg total) by mouth daily at 6 PM. 30 tablet 0  . dabigatran (PRADAXA) 150 MG CAPS capsule Take 1 capsule (150 mg total) by mouth every 12 (twelve) hours. 60 capsule 0  . diltiazem (CARDIZEM CD) 240 MG 24 hr capsule Take 1 capsule (240 mg total) by mouth daily. 90 capsule 3  . diltiazem (CARDIZEM) 30 MG tablet Take 1 tablet (30 mg total) by mouth 4 (four) times daily as needed. Prn 120 tablet 11  . potassium chloride (KLOR-CON) 10 MEQ tablet Take 1 tablet (10  mEq total) by mouth daily. 30 tablet 0   No current facility-administered medications for this encounter.   Facility-Administered Medications Ordered in Other Encounters  Medication Dose Route Frequency Provider Last Rate Last Admin  . regadenoson (LEXISCAN) injection SOLN 0.4 mg  0.4 mg Intravenous Once Buford Dresser, MD      . technetium tetrofosmin (TC-MYOVIEW) injection 32 millicurie  32 millicurie Intravenous Once PRN Buford Dresser, MD        Allergies  Allergen Reactions  . Duloxetine     Causes Bp elevation.  Increased body  jerks, nausea  . Acetaminophen Hives and Rash    Social History   Socioeconomic History  . Marital status: Married    Spouse name: Not on file  . Number of children: Not on file  . Years of education: Not on file  . Highest education level: Not on file  Occupational History  . Not on file  Tobacco Use  . Smoking status: Never Smoker  . Smokeless tobacco: Never Used  Vaping Use  . Vaping Use: Never used  Substance and Sexual Activity  . Alcohol use: No  . Drug use: No  . Sexual activity: Not on file  Other Topics Concern  . Not on file  Social History Narrative  . Not on file   Social Determinants of Health   Financial Resource Strain:   . Difficulty of Paying Living Expenses: Not on file  Food Insecurity:   . Worried About Charity fundraiser in the Last Year: Not on file  . Ran Out of Food in the Last Year: Not on file  Transportation Needs:   . Lack of Transportation (Medical): Not on file  . Lack of Transportation (Non-Medical): Not on file  Physical Activity:   . Days of Exercise per Week: Not on file  . Minutes of Exercise per Session: Not on file  Stress:   . Feeling of Stress : Not on file  Social Connections:   . Frequency of Communication with Friends and Family: Not on file  . Frequency of Social Gatherings with Friends and Family: Not on file  . Attends Religious Services: Not on file  . Active Member of Clubs or Organizations: Not on file  . Attends Archivist Meetings: Not on file  . Marital Status: Not on file  Intimate Partner Violence:   . Fear of Current or Ex-Partner: Not on file  . Emotionally Abused: Not on file  . Physically Abused: Not on file  . Sexually Abused: Not on file    Family History  Problem Relation Age of Onset  . Colon cancer Father   . Diabetes Mother   . Heart attack Mother        42  . Heart attack Brother 71    ROS- All systems are reviewed and negative except as per the HPI above  Physical Exam:  Vitals:   02/12/20 1006  BP: 117/75  Pulse: (!) 165  Weight: 60.6 kg  Height: 5\' 5"  (1.651 m)   Wt Readings from Last 3 Encounters:  02/12/20 60.6 kg  02/07/20 61.1 kg  02/07/20 60.8 kg    Labs: Lab Results  Component Value Date   NA 139 01/20/2020   K 4.1 01/20/2020   CL 103 01/20/2020   CO2 30 01/20/2020   GLUCOSE 119 (H) 01/20/2020   BUN 9 01/20/2020   CREATININE 0.63 01/20/2020   CALCIUM 9.4 01/20/2020   MG 1.8 01/14/2020   Lab Results  Component Value Date   INR 1.3 (H) 01/09/2020   Lab Results  Component Value Date   CHOL 183 01/10/2020   HDL 92 01/10/2020   LDLCALC 81 01/10/2020   TRIG 52 01/10/2020     GEN- The patient is well appearing, alert and oriented x 3 today.   Head- normocephalic, atraumatic Eyes-  Sclera clear, conjunctiva pink Ears- hearing intact Oropharynx- clear Neck- supple, no JVP Lymph- no cervical lymphadenopathy Lungs- Clear to ausculation bilaterally, normal work of breathing Heart- Rapid irregular rate and rhythm, no murmurs, rubs or gallops, PMI not laterally displaced GI- soft, NT, ND, + BS Extremities- no clubbing, cyanosis, or edema MS- no significant deformity or atrophy Skin- no rash or lesion Psych- euthymic mood, full affect Neuro- strength and sensation are intact  EKG-afib at 165 bpm, qrs int 74 bpm, qtc 387 ms ( pulse ox worn during the appointment showed 100's to 110's ) Echo-1. The left ventricle has normal systolic function of 88-75%. The cavity  size was normal. There is no increased left ventricular wall thickness.  Echo evidence of pseudonormalization in diastolic relaxation and  indeterminate left ventricular filling  pressure.  2. Grade 2 diastolic dysfunction.  3. The right ventricle has normal systolic function. The cavity was  normal. There is no increase in right ventricular wall thickness.  4. Left atrial size was severely dilated.  5. Right atrial size was severely dilated.  6. The mitral  valve is normal in structure.  7. The tricuspid valve is normal in structure. Tricuspid valve  regurgitation is moderate.  8. The aortic valve is normal in structure. There is moderate thickening  and moderate calcification of the aortic valve.  9. The pulmonic valve was normal in structure.      Assessment and Plan: 1. 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 Rayann Heman  She will not be a candidate for ablation for her recent stroke and severly dilated atrium The pt and her husband are very fearful of another cardioversion which will likely if we try to restore SR I talked to them at length re options of amiodarone and tikosyn, risk and benefit of both For now, the would prefer to avoid efforts  to return to SR and would like to try additional rate control She is currently taking 240 mg diltiazem  in the am and I will add another 120 mg in the pm. I reviewed BP's at home and it seems she has enough BP to work with extra rate control.   2. CHA2DS2VASc score of at least 6 Continue  pradaxa 150 mg bid   F/u in afib clinic on Monday   Mary Mercado, [facility] [address], [place] [address] [phone]

## 2020-02-14 ENCOUNTER — Encounter (HOSPITAL_COMMUNITY): Payer: Self-pay | Admitting: Nurse Practitioner

## 2020-02-14 ENCOUNTER — Other Ambulatory Visit (HOSPITAL_COMMUNITY): Payer: Self-pay | Admitting: *Deleted

## 2020-02-14 MED ORDER — DILTIAZEM HCL ER COATED BEADS 120 MG PO CP24: 120.0000 mg | ORAL_CAPSULE | Freq: Every day | ORAL | 3 refills | Status: DC

## 2020-02-17 ENCOUNTER — Encounter (HOSPITAL_COMMUNITY): Payer: Self-pay | Admitting: Nurse Practitioner

## 2020-02-17 ENCOUNTER — Other Ambulatory Visit: Payer: Self-pay

## 2020-02-17 ENCOUNTER — Ambulatory Visit (HOSPITAL_COMMUNITY)
Admission: RE | Admit: 2020-02-17 | Discharge: 2020-02-17 | Disposition: A | Discharge status: Home / Self Care | Payer: Medicare Other | Source: Ambulatory Visit | Attending: Nurse Practitioner | Admitting: Nurse Practitioner

## 2020-02-17 VITALS — BP 128/78 | HR 140 | Ht 65.0 in | Wt 134.2 lb

## 2020-02-17 DIAGNOSIS — Z888 Allergy status to other drugs, medicaments and biological substances status: Secondary | ICD-10-CM | POA: Diagnosis not present

## 2020-02-17 DIAGNOSIS — Z8249 Family history of ischemic heart disease and other diseases of the circulatory system: Secondary | ICD-10-CM | POA: Insufficient documentation

## 2020-02-17 DIAGNOSIS — I4891 Unspecified atrial fibrillation: Secondary | ICD-10-CM | POA: Diagnosis not present

## 2020-02-17 DIAGNOSIS — Z7901 Long term (current) use of anticoagulants: Secondary | ICD-10-CM | POA: Diagnosis not present

## 2020-02-17 DIAGNOSIS — D6869 Other thrombophilia: Secondary | ICD-10-CM

## 2020-02-17 DIAGNOSIS — Z8673 Personal history of transient ischemic attack (TIA), and cerebral infarction without residual deficits: Secondary | ICD-10-CM | POA: Diagnosis not present

## 2020-02-17 DIAGNOSIS — Z79899 Other long term (current) drug therapy: Secondary | ICD-10-CM | POA: Insufficient documentation

## 2020-02-17 DIAGNOSIS — Z886 Allergy status to analgesic agent status: Secondary | ICD-10-CM | POA: Diagnosis not present

## 2020-02-17 MED ORDER — METOPROLOL TARTRATE 25 MG PO TABS: 12.5000 mg | ORAL_TABLET | Freq: Two times a day (BID) | ORAL | 3 refills | Status: DC

## 2020-02-17 NOTE — Progress Notes (Signed)
Primary Care Physician: [name], MD Referring Physician: Dr. Wendall Mola Mercado Mary is a 79 y.o. 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 A 30 day monitor was placed and she was found to have afib. Eliquis was started for a CHA2DS2VASc score of 6. She had return to afib in July 2020 and a successful cardioversion was performed. She then was in her usual state of health until September 2021 when she presented to Dr. Acie Fredrickson with afib with RVR. She was set up for cardioversion, which was successful, stated no known missed eliquis doses. She was started on flecainide and had a stress test the next day. She developed stroke symptoms while at the [facility] street office and was sent directly to the [facility] ER as Code stroke.   She had f/u with Dr. Acie Fredrickson a few days ago and was in afib with RVR. He stopped flecainide. She was changed to Pradaxa after the last stroke.  She  was sent here to discuss options to return to SR. I do not believe she will an ablation candidate as she had a recent stroke and her rt and left  upper chambers are severely dilated. Her EKG showed v rate at 165 bpm. However, pulse ox worn during the visit, showed heart rates in the 100's. They are using 30 mg cardizem as needed to control v rates at home as well at her 240 mg daily. It appears that her best options to get back in rhythm would be amiodarone or tikosyn, but either way the pt and her husband are very leary re a repeat cardioversion. She would not be an ablation candidate for her recent stroke and enlarged atrium. She appears to be tolerating the afib ok.   F/u in afib clinic, 10/25/2, after increase of Cardizem for better rate control  as the pt and her husband did not want to pursue SR if that meant another cardioversion for fear of repeat stroke. Her v rate is 140 in afib today, but her readings at home show 93 to 116  bpm. Pulse ox during the visit showed HR's 80's to 120 bpm. She states that  she feels well. She  has not noted any adverse effect from  increase in Cardizem. Pt  and husband  still hesitant to consider any plans that may require another cardioversion. She is being compliant with her Pradaxa for CHA2DS2VASc score of 6.  I did mention that a TEE could be done with another cardioversion attempt if needed to minimize  her stroke risk and tikosyn itself may convert her with a cardioversion needed.  Today, she denies symptoms of palpitations, chest pain, shortness of breath, orthopnea, PND, lower extremity edema, dizziness, presyncope, syncope, or neurologic sequela. The patient is tolerating medications without difficulties and is otherwise without complaint today.   Past Medical History:  Diagnosis Date  . Allergy   . CVA (cerebral vascular accident) ([place])   . Hyperlipidemia LDL goal <70   . Hypertension   . Palpitations   . Vertigo, benign positional    Past Surgical History:  Procedure Laterality Date  . CARDIOVERSION Left 10/31/2018   Procedure: CARDIOVERSION;  Surgeon: Acie Fredrickson Wonda Cheng, MD;  Location: [place];  Service: Cardiovascular;  Laterality: Left;  . CARDIOVERSION N/A 01/08/2020   Procedure: CARDIOVERSION;  Surgeon: [name], MD;  Location: [facility] ENDOSCOPY;  Service: Cardiovascular;  Laterality: N/A;  . Hemilaminectomy and microdiskectomy at L4-5 on the left.  12/10/2003  .  TEE WITHOUT CARDIOVERSION N/A 06/05/2018   Procedure: TRANSESOPHAGEAL ECHOCARDIOGRAM (TEE);  Surgeon: Lelon Perla, MD;  Location: [facility] ENDOSCOPY;  Service: Cardiovascular;  Laterality: N/A;  loop    Current Outpatient Medications  Medication Sig Dispense Refill  . atorvastatin (LIPITOR) 80 MG tablet TAKE 1 TABLET (80 MG TOTAL) BY MOUTH DAILY AT 6 PM. 30 tablet 0  . diltiazem (CARDIZEM CD) 120 MG 24 hr capsule Take 1 capsule (120 mg total) by mouth at bedtime. 30 capsule 3  . diltiazem (CARDIZEM CD) 240 MG 24 hr capsule Take 1 capsule (240 mg total) by mouth daily. 90 capsule  3  . diltiazem (CARDIZEM) 30 MG tablet Take 1 tablet (30 mg total) by mouth 4 (four) times daily as needed. Prn 120 tablet 11  . KLOR-CON M10 10 MEQ tablet TAKE 1 TABLET BY MOUTH EVERY DAY 30 tablet 0  . PRADAXA 150 MG CAPS capsule TAKE 1 CAPSULE (150 MG TOTAL) BY MOUTH EVERY 12 (TWELVE) HOURS. 60 capsule 0   No current facility-administered medications for this encounter.   Facility-Administered Medications Ordered in Other Encounters  Medication Dose Route Frequency Provider Last Rate Last Admin  . regadenoson (LEXISCAN) injection SOLN 0.4 mg  0.4 mg Intravenous Once Buford Dresser, MD      . technetium tetrofosmin (TC-MYOVIEW) injection 32 millicurie  32 millicurie Intravenous Once PRN Buford Dresser, MD        Allergies  Allergen Reactions  . Duloxetine     Causes Bp elevation.  Increased body jerks, nausea  . Acetaminophen Hives and Rash    Social History   Socioeconomic History  . Marital status: Married    Spouse name: Not on file  . Number of children: Not on file  . Years of education: Not on file  . Highest education level: Not on file  Occupational History  . Not on file  Tobacco Use  . Smoking status: Never Smoker  . Smokeless tobacco: Never Used  Vaping Use  . Vaping Use: Never used  Substance and Sexual Activity  . Alcohol use: No  . Drug use: No  . Sexual activity: Not on file  Other Topics Concern  . Not on file  Social History Narrative  . Not on file   Social Determinants of Health   Financial Resource Strain:   . Difficulty of Paying Living Expenses: Not on file  Food Insecurity:   . Worried About Charity fundraiser in the Last Year: Not on file  . Ran Out of Food in the Last Year: Not on file  Transportation Needs:   . Lack of Transportation (Medical): Not on file  . Lack of Transportation (Non-Medical): Not on file  Physical Activity:   . Days of Exercise per Week: Not on file  . Minutes of Exercise per Session: Not on file   Stress:   . Feeling of Stress : Not on file  Social Connections:   . Frequency of Communication with Friends and Family: Not on file  . Frequency of Social Gatherings with Friends and Family: Not on file  . Attends Religious Services: Not on file  . Active Member of Clubs or Organizations: Not on file  . Attends Archivist Meetings: Not on file  . Marital Status: Not on file  Intimate Partner Violence:   . Fear of Current or Ex-Partner: Not on file  . Emotionally Abused: Not on file  . Physically Abused: Not on file  . Sexually Abused: Not on file  Family History  Problem Relation Age of Onset  . Colon cancer Father   . Diabetes Mother   . Heart attack Mother        65  . Heart attack Brother 69    ROS- All systems are reviewed and negative except as per the HPI above  Physical Exam: There were no vitals filed for this visit. Wt Readings from Last 3 Encounters:  02/12/20 60.6 kg  02/07/20 61.1 kg  02/07/20 60.8 kg    Labs: Lab Results  Component Value Date   NA 139 01/20/2020   K 4.1 01/20/2020   CL 103 01/20/2020   CO2 30 01/20/2020   GLUCOSE 119 (H) 01/20/2020   BUN 9 01/20/2020   CREATININE 0.63 01/20/2020   CALCIUM 9.4 01/20/2020   MG 1.8 01/14/2020   Lab Results  Component Value Date   INR 1.3 (H) 01/09/2020   Lab Results  Component Value Date   CHOL 183 01/10/2020   HDL 92 01/10/2020   LDLCALC 81 01/10/2020   TRIG 52 01/10/2020     GEN- The patient is well appearing, alert and oriented x 3 today.   Head- normocephalic, atraumatic Eyes-  Sclera clear, conjunctiva pink Ears- hearing intact Oropharynx- clear Neck- supple, no JVP Lymph- no cervical lymphadenopathy Lungs- Clear to ausculation bilaterally, normal work of breathing Heart- Rapid irregular rate and rhythm, no murmurs, rubs or gallops, PMI not laterally displaced GI- soft, NT, ND, + BS Extremities- no clubbing, cyanosis, or edema MS- no significant deformity or  atrophy Skin- no rash or lesion Psych- euthymic mood, full affect Neuro- strength and sensation are intact  EKG-afib at 140 bpm , qrs int 76 bpm, qtc 421 ms ( pulse ox worn during the appointment showed 80's to 120's ) Echo-1. The left ventricle has normal systolic function of 01-75%. The cavity  size was normal. There is no increased left ventricular wall thickness.  Echo evidence of pseudonormalization in diastolic relaxation and  indeterminate left ventricular filling  pressure.  2. Grade 2 diastolic dysfunction.  3. The right ventricle has normal systolic function. The cavity was  normal. There is no increase in right ventricular wall thickness.  4. Left atrial size was severely dilated.  5. Right atrial size was severely dilated.  6. The mitral valve is normal in structure.  7. The tricuspid valve is normal in structure. Tricuspid valve  regurgitation is moderate.  8. The aortic valve is normal in structure. There is moderate thickening  and moderate calcification of the aortic valve.  9. The pulmonic valve was normal in structure.      Assessment and Plan: 1. 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 Rayann Heman  She will not be a candidate for ablation for her recent stroke and severly dilated atrium The pt and her husband are very fearful of another cardioversion which will likely if we try to restore SR I talked to them at length re options of amiodarone and tikosyn, risk and benefit of both For now, the would prefer to avoid efforts  to return to SR and would like to try additional rate control with the addition of metoprolol 12.5 mg bid in addition to 240 mg Cardizem in the am and 120 Cardizem in the pm, which she is currently tolerating from  a BP standapoint  2. CHA2DS2VASc score of at least 6 Continue  pradaxa 150 mg bid  We discussed all options again today   The husband will continue to watch her BP  and HR at home until I see back  Butch Penny C. Jeydi Klingel, [facility] [address] [place], [place] [address] [phone]

## 2020-02-17 NOTE — Patient Instructions (Signed)
Start metoprolol 1/2 tablet twice a day   

## 2020-02-19 ENCOUNTER — Ambulatory Visit: Payer: Medicare Other | Admitting: Occupational Therapy

## 2020-02-19 ENCOUNTER — Encounter: Payer: Self-pay | Admitting: Occupational Therapy

## 2020-02-19 ENCOUNTER — Other Ambulatory Visit: Payer: Self-pay

## 2020-02-19 DIAGNOSIS — R41844 Frontal lobe and executive function deficit: Secondary | ICD-10-CM

## 2020-02-19 DIAGNOSIS — R208 Other disturbances of skin sensation: Secondary | ICD-10-CM | POA: Diagnosis not present

## 2020-02-19 DIAGNOSIS — R41842 Visuospatial deficit: Secondary | ICD-10-CM

## 2020-02-19 DIAGNOSIS — R2689 Other abnormalities of gait and mobility: Secondary | ICD-10-CM | POA: Diagnosis not present

## 2020-02-19 DIAGNOSIS — M6281 Muscle weakness (generalized): Secondary | ICD-10-CM | POA: Diagnosis not present

## 2020-02-19 DIAGNOSIS — R2681 Unsteadiness on feet: Secondary | ICD-10-CM

## 2020-02-19 DIAGNOSIS — R278 Other lack of coordination: Secondary | ICD-10-CM | POA: Diagnosis not present

## 2020-02-19 DIAGNOSIS — R41840 Attention and concentration deficit: Secondary | ICD-10-CM

## 2020-02-19 NOTE — Therapy (Signed)
[facility] [address] [place], [place], [address] Phone: [phone]   Fax:  [phone]  Occupational Therapy Evaluation  Patient Details  Name: Mary Mercado MRN: [id number] Date of Birth: 09-Apr-1941 Referring Provider (OT): [name], MD   Encounter Date: 02/19/2020   OT End of Session - 02/19/20 1601    Visit Number 1    Number of Visits 17    Date for OT Re-Evaluation 04/15/20    Authorization Type Medicare Part A and B; BCBS supplement    Authorization Time Period VL:MN No Auth    Authorization - Visit Number 1    Authorization - Number of Visits 10    Progress Note Due on Visit 10    OT Start Time 1446    OT Stop Time 1530    OT Time Calculation (min) 44 min    Activity Tolerance Patient tolerated treatment well    Behavior During Therapy [facility] for tasks assessed/performed;Impulsive           Past Medical History:  Diagnosis Date  . Allergy   . CVA (cerebral vascular accident) ([place])   . Hyperlipidemia LDL goal <70   . Hypertension   . Palpitations   . Vertigo, benign positional     Past Surgical History:  Procedure Laterality Date  . CARDIOVERSION Left 10/31/2018   Procedure: CARDIOVERSION;  Surgeon: Acie Fredrickson Wonda Cheng, MD;  Location: [place];  Service: Cardiovascular;  Laterality: Left;  . CARDIOVERSION N/A 01/08/2020   Procedure: CARDIOVERSION;  Surgeon: [name], MD;  Location: [facility] ENDOSCOPY;  Service: Cardiovascular;  Laterality: N/A;  . Hemilaminectomy and microdiskectomy at L4-5 on the left.  12/10/2003  . TEE WITHOUT CARDIOVERSION N/A 06/05/2018   Procedure: TRANSESOPHAGEAL ECHOCARDIOGRAM (TEE);  Surgeon: Lelon Perla, MD;  Location: [facility] ENDOSCOPY;  Service: Cardiovascular;  Laterality: N/A;  loop    There were no vitals filed for this visit.   Subjective Assessment - 02/19/20 1553    Subjective  Pt is an 79 year old female that presents to [place] at  neurorehabilitation accompanied by spouse. 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 Pt reports being independent with ADLs. Pt reports blurry vision but reports that it is getting better with time.    Patient is accompanied by: Family member   Husband   Pertinent History PMH - CAD, hx of CVA, PAF s/p DCCV, BPPV, a-fib (currently undergoing testing to stop a-fib but was told not a candidate for ablation and might have to live with a-fib), HLD.    Limitations Fall Risk, No Driving. A-fib    Patient Stated Goals Pt states goals as "get back to feeling like my real self" and reports wanting to get back to "cooking and daily chores"    Currently in Pain? No/denies             [facility] OT Assessment - 02/19/20 1453      Assessment   Medical Diagnosis R MCA CVA    Referring Provider (OT) [name], MD    Onset Date/Surgical Date 01/09/20    Hand Dominance Right    Prior Therapy CIR and PT at this location earlier in the year for previous CVA      Precautions   Precautions Fall    Precaution Comments a-fib, no driving      Balance Screen   Has the patient fallen in the past 6 months No   defer to PT  Home  Environment   Family/patient expects to be discharged to: Private residence    Living Arrangements Spouse/significant other    Type of [place] to live on main level with bedroom/bathroom    Bathroom Shower/Tub [place] - 4 wheels;Shower seat;Grab bars - tub/shower    Additional Comments Level Entry. 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 Prior to CVA, pt was completing all IADLs independently. Pt would complete shopping, home management, housekeeping and cooking. Pt is currently not driving      IADL   Shopping Needs to be accompanied on any shopping trip    Light Housekeeping Needs help with all home maintenance tasks;Does not participate in any housekeeping tasks    Meal Prep Needs to have meals prepared and served    [employer] on family or friends for transportation    Medication Management Is responsible for taking medication in correct dosages at correct time    Financial Management Requires assistance      Mobility   Mobility Status Comments Supervision for amublation and dizziness report after short distance      Written Expression   Dominant Hand Right    Handwriting 75% legible;Increased time      Vision - History   Baseline Vision Wears glasses only for reading    Additional Comments Pt reports no significant vision histoy but previous evaluation reports Lt cataract surgery      Vision Assessment   Ocular Range of Motion Within Functional Limits    Saccades Additional eye shifts occurred during testing   right eye "jumpy" saccade to the left   Convergence Impaired (comment)   continue to assess   Visual Fields No apparent deficits    Diplopia Assessment --   no complaints of diplopia   Comment Pt reports blurriness post CVA. Reports difficulty with watching sports but it has become easier with time.      Activity Tolerance   Activity Tolerance Tolerates 10-20 min activity with multiple rests    Activity Tolerance Comments Pt reports getting fatigued easily. Reports of dizziness and fatigue during amublation of approximately 25 feet from lobby to therapy gym      Cognition   Overall  Cognitive Status Impaired/Different from baseline    Attention Focused;Sustained;Selective;Alternating;Divided    Focused Attention Impaired    Focused Attention Impairment Verbal basic;Functional basic    Sustained Attention Impaired    Selective Attention Impaired    Alternating Attention Impaired    Divided Attention Impaired    Memory Appears intact    Awareness Impaired    Awareness Impairment Intellectual impairment    Behaviors Restless;Impulsive      Observation/Other Assessments   Focus on Therapeutic Outcomes (FOTO)  46/100  Sensation   Light Touch Appears Intact      Coordination   9 Hole Peg Test Right;Left    Right 9 Hole Peg Test 25.72    Left 9 Hole Peg Test 42.06    Box and Blocks RUE 61 LUE 58      Perception   Perception Within Functional Limits      ROM / Strength   AROM / PROM / Strength AROM;Strength      AROM   Overall AROM Comments BUE AROM WFLs      Strength   Overall Strength Comments LUE and RUE grossly 4/5      Hand Function   Right Hand Grip (lbs) 40.5 lbs    Left Hand Grip (lbs) 37.9 lbs    Comment Gross Grasp/Extension WFL 100%                           OT Education - 02/19/20 1556    Education Details Education provided on role and purpose of OT    Person(s) Educated Patient;Spouse    Methods Explanation    Comprehension Verbalized understanding            OT Short Term Goals - 02/19/20 1606      OT SHORT TERM GOAL #1   Title Pt will be independent with HEP 03/18/2020    Time 4    Period Weeks    Status New    Target Date 03/18/20      OT SHORT TERM GOAL #2   Title Pt will demonstrate increased handwriting legibility of 3-5 sentences to 90%    Time 4    Period Weeks    Status New      OT SHORT TERM GOAL #3   Title Pt will tolerate 10 minutes of standing in order to increase standing tolerance for preparation for cooking and home management.    Time 4    Period Weeks    Status New      OT  SHORT TERM GOAL #4   Title Pt will complete environmental scanning with 75% accuracy    Time 4    Period Weeks    Status New      OT SHORT TERM GOAL #5   Title Pt will complete simple warm meal prep and/or home management task with min A    Time 4    Period Weeks    Status New      OT SHORT TERM GOAL #6   Title Continue to assess vision and set goal PRN    Time 4    Period Weeks    Status New             OT Long Term Goals - 02/19/20 1613      OT LONG TERM GOAL #1   Title Pt will be independent with updated HEP 04/15/2020    Time 8    Period Weeks    Status New    Target Date 04/15/20      OT LONG TERM GOAL #2   Title Pt will improve coordination with LUE evidenced by completing 9 hole peg test in 35 seconds or less.    Baseline 42.06 seconds LUE    Time 8    Period Weeks    Status New      OT LONG TERM GOAL #3   Title Pt will complete simple warm meal prep and/or home management with supervision and  good safety awareness    Time 8    Period Weeks    Status New      OT LONG TERM GOAL #4   Title Pt will complete environmental scanning with 90% accuracy    Time 8    Period Weeks    Status New      OT LONG TERM GOAL #5   Title Pt will tolerate 20 minutes of standing activities for increasing activity tolerance for increasing ability to complete home management activities and cooking.    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 Pt's PMH is significant for the following: CAD, hx of CVA, PAF s/p DCCV, BPPV, a-fib (currently undergoing testing to stop a-fib but was told not a candidate for ablation and might have to live with a-fib), HLD. Pt presents with deficits with LUE coordination, unsteadiness on feet and decreased safety awareness and cognitive skills impeding ability to complete ADLs and IADLs with maximum  independence and safety. Skilled occupational therapy is recommended to target the areas of deficit and increase independence and decrease caregiver burden.    OT Occupational Profile and History Detailed Assessment- Review of Records and additional review of physical, cognitive, psychosocial history related to current functional performance    Occupational performance deficits (Please refer to evaluation for details): ADL's;IADL's;Leisure    Body Structure / Function / Physical Skills ADL;UE functional use;FMC;Decreased knowledge of use of DME;Mobility;Sensation;Vision;Strength;Endurance;Coordination;IADL;Tone;Cardiopulmonary status limiting activity;Balance;Dexterity;GMC    Cognitive Skills Attention;Safety Awareness;Thought;Perception;Understand    Rehab Potential Good    Clinical Decision Making Limited treatment options, no task modification necessary    Comorbidities Affecting Occupational Performance: May have comorbidities impacting occupational performance    Modification or Assistance to Complete Evaluation  No modification of tasks or assist necessary to complete eval    OT Frequency 2x / week    OT Duration 8 weeks    OT Treatment/Interventions Self-care/ADL training;Therapeutic exercise;Moist Heat;Neuromuscular education;Patient/family education;Visual/perceptual remediation/compensation;Therapist, nutritional;Therapeutic activities;DME and/or AE instruction;Manual Therapy;Cognitive remediation/compensation;Balance training;Energy conservation    Plan coordination HEP, standing balance and tolerance    Consulted and Agree with Plan of Care Patient;Family member/caregiver    Family Member Consulted husband           Patient will benefit from skilled therapeutic intervention in order to improve the following deficits and impairments:   Body Structure / Function / Physical Skills: ADL, UE functional use, FMC, Decreased knowledge of use of DME, Mobility, Sensation, Vision,  Strength, Endurance, Coordination, IADL, Tone, Cardiopulmonary status limiting activity, Balance, Dexterity, GMC Cognitive Skills: Attention, Safety Awareness, Thought, Perception, Understand     Visit Diagnosis: Other abnormalities of gait and mobility - Plan: Ot plan of care cert/re-cert  Muscle weakness (generalized) - Plan: Ot plan of care cert/re-cert  Unsteadiness on feet - Plan: Ot plan of care cert/re-cert  Other lack of coordination - Plan: Ot plan of care cert/re-cert  Visuospatial deficit - Plan: Ot plan of care cert/re-cert  Frontal lobe and executive function deficit - Plan: Ot plan of care cert/re-cert  Attention and concentration deficit - Plan: Ot plan of care cert/re-cert    Problem List Patient Active Problem List   Diagnosis Date Noted  . Hemiplegia and hemiparesis following cerebral infarction affecting left non-dominant  side ([place]) 02/05/2020  . 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 CVA (cerebral vascular accident) ([place]) 01/09/2020  . Dizziness 11/30/2018  . Atrial fibrillation ([place]) 08/13/2018  . Prediabetes 06/15/2018  . Palpitations 06/05/2018  . Essential hypertension 06/05/2018  . Hyperlipidemia LDL goal <70 06/05/2018  . Acute ischemic right MCA stroke ([place]) 06/05/2018  . Stroke (cerebrum) ([place]) - R MCA s/p tPA, embolic, source unknown 90/24/0973  . SVT (supraventricular tachycardia) ([place]) 05/28/2015  . PVC's (premature ventricular contractions) 05/28/2015  . BENIGN POSITIONAL VERTIGO 06/16/2009  . ALLERGIC RHINITIS 06/16/2009    Zachery Conch MOT, OTR/L  02/19/2020, 5:58 PM  [facility] [address] [place], [place], [address] Phone: [phone]   Fax:  [phone]  Name: Mary Mercado MRN: [id number] Date of Birth: 1940/10/12

## 2020-02-20 ENCOUNTER — Ambulatory Visit: Payer: Medicare Other

## 2020-02-20 ENCOUNTER — Other Ambulatory Visit: Payer: Self-pay

## 2020-02-20 ENCOUNTER — Encounter (HOSPITAL_COMMUNITY): Payer: Self-pay | Admitting: Nurse Practitioner

## 2020-02-20 ENCOUNTER — Ambulatory Visit (HOSPITAL_COMMUNITY)
Admission: RE | Admit: 2020-02-20 | Discharge: 2020-02-20 | Disposition: A | Discharge status: Home / Self Care | Payer: Medicare Other | Source: Ambulatory Visit | Attending: Nurse Practitioner | Admitting: Nurse Practitioner

## 2020-02-20 VITALS — BP 88/56 | HR 112 | Ht 65.0 in | Wt 134.6 lb

## 2020-02-20 VITALS — BP 81/58 | HR 80

## 2020-02-20 DIAGNOSIS — D6869 Other thrombophilia: Secondary | ICD-10-CM | POA: Diagnosis not present

## 2020-02-20 DIAGNOSIS — Z8673 Personal history of transient ischemic attack (TIA), and cerebral infarction without residual deficits: Secondary | ICD-10-CM | POA: Diagnosis not present

## 2020-02-20 DIAGNOSIS — R208 Other disturbances of skin sensation: Secondary | ICD-10-CM | POA: Diagnosis not present

## 2020-02-20 DIAGNOSIS — M6281 Muscle weakness (generalized): Secondary | ICD-10-CM | POA: Diagnosis not present

## 2020-02-20 DIAGNOSIS — R41842 Visuospatial deficit: Secondary | ICD-10-CM | POA: Diagnosis not present

## 2020-02-20 DIAGNOSIS — R2689 Other abnormalities of gait and mobility: Secondary | ICD-10-CM

## 2020-02-20 DIAGNOSIS — I4819 Other persistent atrial fibrillation: Secondary | ICD-10-CM

## 2020-02-20 DIAGNOSIS — R2681 Unsteadiness on feet: Secondary | ICD-10-CM | POA: Diagnosis not present

## 2020-02-20 DIAGNOSIS — R278 Other lack of coordination: Secondary | ICD-10-CM | POA: Diagnosis not present

## 2020-02-20 DIAGNOSIS — Z7901 Long term (current) use of anticoagulants: Secondary | ICD-10-CM | POA: Insufficient documentation

## 2020-02-20 DIAGNOSIS — Z79899 Other long term (current) drug therapy: Secondary | ICD-10-CM | POA: Insufficient documentation

## 2020-02-20 DIAGNOSIS — I4891 Unspecified atrial fibrillation: Secondary | ICD-10-CM | POA: Diagnosis not present

## 2020-02-20 NOTE — Therapy (Signed)
[facility] [address] [place], [place], [address] Phone: [phone]   Fax:  [phone]  Physical Therapy Treatment  Patient Details  Name: Mary Mercado MRN: [id number] Date of Birth: April 14, 1941 Referring Provider (PT): [name], [facility]   Encounter Date: 02/20/2020   PT End of Session - 02/20/20 1345    Visit Number 2    Number of Visits 17    Date for PT Re-Evaluation 05/13/20    Authorization Type Medicare (PN every 10th visit) and BCBS supplemental    PT Start Time 1101    PT Stop Time 1144    PT Time Calculation (min) 43 min    Equipment Utilized During Treatment Other (comment)   min guard to S prn   Activity Tolerance Patient tolerated treatment well    Behavior During Therapy [facility] for tasks assessed/performed;Impulsive           Past Medical History:  Diagnosis Date  . Allergy   . CVA (cerebral vascular accident) ([place])   . Hyperlipidemia LDL goal <70   . Hypertension   . Palpitations   . Vertigo, benign positional     Past Surgical History:  Procedure Laterality Date  . CARDIOVERSION Left 10/31/2018   Procedure: CARDIOVERSION;  Surgeon: Acie Fredrickson Wonda Cheng, MD;  Location: [place];  Service: Cardiovascular;  Laterality: Left;  . CARDIOVERSION N/A 01/08/2020   Procedure: CARDIOVERSION;  Surgeon: [name], MD;  Location: [facility] ENDOSCOPY;  Service: Cardiovascular;  Laterality: N/A;  . Hemilaminectomy and microdiskectomy at L4-5 on the left.  12/10/2003  . TEE WITHOUT CARDIOVERSION N/A 06/05/2018   Procedure: TRANSESOPHAGEAL ECHOCARDIOGRAM (TEE);  Surgeon: Lelon Perla, MD;  Location: [facility] ENDOSCOPY;  Service: Cardiovascular;  Laterality: N/A;  loop    Vitals:   02/20/20 1107 02/20/20 1141  BP: 96/76 (!) 81/58  Pulse: 85 80     Subjective Assessment - 02/20/20 1105    Subjective Pt denied falls since last visit. Pt stated HR was 95bpm prior to PT session at home and BP  was stable.    Pertinent History CAD, hx of CVA, PAF s/p DCCV, BPPV, a-fib (currently undergoing testing to stop a-fib but was told not a candidate for ablation and might have to live with a-fib), HLD    Patient Stated Goals To be able to feel good when I walk, improve endurance    Currently in Pain? No/denies                             [facility] Adult PT Treatment/Exercise - 02/20/20 1111      Standardized Balance Assessment   Standardized Balance Assessment Berg Balance Test;Dynamic Gait Index      Berg Balance Test   Sit to Stand Able to stand without using hands and stabilize independently    Standing Unsupported Able to stand safely 2 minutes    Sitting with Back Unsupported but Feet Supported on Floor or Stool Able to sit safely and securely 2 minutes    Stand to Sit Sits safely with minimal use of hands    Transfers Able to transfer safely, minor use of hands    Standing Unsupported with Eyes Closed Able to stand 10 seconds with supervision    Standing Ubsupported with Feet Together Able to place feet together independently and stand for 1 minute with supervision    From Standing, Reach Forward with Outstretched Arm Can reach forward >12 cm  safely (5")   RUE: 5.5"   From Standing Position, Pick up Object from [place] to pick up shoe, needs supervision    From Standing Position, Turn to Look Behind Over each Shoulder Looks behind one side only/other side shows less weight shift    Turn 360 Degrees Able to turn 360 degrees safely but slowly   reported wooziness/dizziness   Standing Unsupported, Alternately Place Feet on Step/Stool Able to stand independently and complete 8 steps >20 seconds    Standing Unsupported, One Foot in Front Able to plae foot ahead of the other independently and hold 30 seconds    Standing on One Leg Tries to lift leg/unable to hold 3 seconds but remains standing independently   LLE: 1 sec. RLE: 4 sec.   Total Score 44      Dynamic Gait  Index   Level Surface Mild Impairment    Change in Gait Speed Moderate Impairment    Gait with Horizontal Head Turns Moderate Impairment    Gait with Vertical Head Turns Moderate Impairment    Gait and Pivot Turn Moderate Impairment    Step Over Obstacle Moderate Impairment    Step Around Obstacles Mild Impairment    Steps Mild Impairment   no rail to ascend, one rail to descend   Total Score 11               Balance Exercises - 02/20/20 1133      OTAGO PROGRAM   Head Movements Standing;5 reps    Neck Movements Standing;5 reps    Back Extension Standing;5 reps    Trunk Movements Standing;5 reps    Ankle Movements Sitting;10 reps    Overall OTAGO Comments One minute marching in place with BUE support on rollator. Cues and demo for proper technique. S for safety.              PT Education - 02/20/20 1344    Education Details PT educated pt on orthostatic hypotension, as her BP decr. during standing activities vs. seated. PT educated pt on outcome measure results and intiated OTAGO HEP.    Person(s) Educated Patient;Spouse    Methods Explanation;Demonstration;Tactile cues;Verbal cues;Handout    Comprehension Verbalized understanding;Returned demonstration;Need further instruction            PT Short Term Goals - 02/20/20 1350      PT SHORT TERM GOAL #1   Title Pt will be independent with HEP for improved strength, balance, dizziness, and gait.  TARGET DATE FOR ALL STGS: 03/12/20    Time 4    Period Weeks    Status New      PT SHORT TERM GOAL #2   Title Perform BERG and DGI writes goals as indicated.    Time 4    Period Weeks    Status Achieved      PT SHORT TERM GOAL #3   Title Pt will improved gait speed to >/=2.20ft/sec. with LRAD to improve gait efficiency.    Baseline 2.14ft/sec with rollator    Time 4    Period Weeks    Status New      PT SHORT TERM GOAL #4   Title Pt will perform TUG with LRAD in </=13.5 sec. to decr. falls risk.    Baseline  18.16sec. with rollator    Time 4    Period Weeks    Status New      PT SHORT TERM GOAL #5   Title Pt will amb. 300' at  MOD I level with LRAD over even terrain to improve functional mobility.    Time 4    Period Weeks    Status New      Additional Short Term Goals   Additional Short Term Goals Yes      PT SHORT TERM GOAL #6   Title Pt will improve BERG score to >/=48/56 to decr. falls risk.    Baseline 44/56    Time 4    Period Weeks    Status New      PT SHORT TERM GOAL #7   Title Pt will improve DGI score to >/=14/24 to decr. falls risk.    Baseline 11/24    Time 4    Period Weeks    Status New             PT Long Term Goals - 02/20/20 1351      PT LONG TERM GOAL #1   Title Pt will verbalize CVA s/s to decr. risk of additional CVA. TARGET DATE FOR ALL LTGS: 04/09/20    Time 8    Period Weeks    Status New      PT LONG TERM GOAL #2   Title Pt will improve gait with LRAD to >/=2.68ft/sec. to safely amb. in the community.    Baseline 2.69ft/sec with rollator    Time 8    Period Weeks    Status New      PT LONG TERM GOAL #3   Title Pt will amb. 600' with LRAD at MOD I level oven even and uneven terrain to improve functional mobility.    Time 8    Period Weeks    Status New      PT LONG TERM GOAL #4   Title Perform vestibular assessment as indicated and write goals as needed.    Time 8    Period Weeks    Status New      PT LONG TERM GOAL #5   Title Pt will improve BERG score to >/=52/56 to decr. falls risk.    Baseline 44/56    Time 8    Period Weeks    Status New      Additional Long Term Goals   Additional Long Term Goals Yes      PT LONG TERM GOAL #6   Title Pt will improve DGI score to >/=16/24 to decr. falls risk.    Baseline 11/24    Time 8    Period Weeks    Status New                 Plan - 02/20/20 1347    Clinical Impression Statement Pt's BERG score and DGI score indicated pt is at high risk for falls. Pt experienced  wooziness/dizziness during standing activities which could be orthostatic hypotension based on medications and s/s. PT will assess next session for orthostatics. PT initiated OTAGO HEP to address balance, strength, and flexibilty deficits. Pt would continue to benefit from skilled PT to improve safety during functional mobility.    Personal Factors and Comorbidities Age;Comorbidity 3+    Comorbidities CAD, hx of CVA, PAF s/p DCCV, BPPV, a-fib (currently undergoing testing to stop a-fib but was told not a candidate for ablation and might have to live with a-fib), HLD    Examination-Activity Limitations Bed Mobility;Bend;Bathing;Carry;Stand;Squat;Stairs;Toileting;Dressing;Transfers;Locomotion Level    Examination-Participation Restrictions Church;Yard Work;Driving;Community Activity;Laundry;Meal Prep;Cleaning    Stability/Clinical Decision Making Evolving/Moderate complexity    Rehab Potential Good  PT Frequency 2x / week    PT Duration 8 weeks    PT Treatment/Interventions ADLs/Self Care Home Management;Aquatic Therapy;Biofeedback;Canalith Repostioning;Balance training;Therapeutic exercise;Manual techniques;Vestibular;Therapeutic activities;Functional mobility training;Stair training;Gait training;Orthotic Fit/Training;Patient/family education;DME Instruction;Neuromuscular re-education    PT Next Visit Plan Assess for orthostatics. Finish OTAGO HEP. Provide CVA ed handout.    Consulted and Agree with Plan of Care Patient;Family member/caregiver    Family Member Consulted pt's husband: [name]           Patient will benefit from skilled therapeutic intervention in order to improve the following deficits and impairments:  Abnormal gait, Decreased endurance, Impaired sensation, Cardiopulmonary status limiting activity, Decreased knowledge of use of DME, Decreased activity tolerance, Decreased strength, Decreased balance, Decreased mobility, Dizziness, Impaired flexibility  Visit Diagnosis: Other  abnormalities of gait and mobility  Muscle weakness (generalized)  Unsteadiness on feet  Other lack of coordination     Problem List Patient Active Problem List   Diagnosis Date Noted  . 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 CVA (cerebral vascular accident) ([place]) 01/09/2020  . Dizziness 11/30/2018  . Atrial fibrillation ([place]) 08/13/2018  . Prediabetes 06/15/2018  . Palpitations 06/05/2018  . Essential hypertension 06/05/2018  . Hyperlipidemia LDL goal <70 06/05/2018  . Acute ischemic right MCA stroke ([place]) 06/05/2018  . Stroke (cerebrum) ([place]) - R MCA s/p tPA, embolic, source unknown 47/65/4650  . SVT (supraventricular tachycardia) ([place]) 05/28/2015  . PVC's (premature ventricular contractions) 05/28/2015  . BENIGN POSITIONAL VERTIGO 06/16/2009  . ALLERGIC RHINITIS 06/16/2009    Davell Beckstead L 02/20/2020, 1:53 PM  [facility] [address] [place], [place], [address] Phone: [phone]   Fax:  [phone]  Name: Rhylynn Perdomo MRN: [id number] Date of Birth: Jan 24, 1941  Geoffry Paradise, PT,DPT 02/20/20 1:53 PM Phone: [phone] Fax: [phone]

## 2020-02-20 NOTE — Progress Notes (Signed)
Primary Care Physician: [name], MD Referring Physician: Dr. Wendall Mola Motsinger Mary is a 79 y.o. 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 A 30 day monitor was placed and she was found to have afib. Eliquis was started for a CHA2DS2VASc score of 6. She had return to afib in July 2020 and a successful cardioversion was performed. She then was in her usual state of health until September 2021 when she presented to Dr. Acie Fredrickson with afib with RVR. She was set up for cardioversion, which was successful, stated no known missed eliquis doses. She was started on flecainide and had a stress test the next day. She developed stroke symptoms while at the [facility] street office and was sent directly to the [facility] ER as Code stroke.   She had f/u with Dr. Acie Fredrickson a few days ago and was in afib with RVR. He stopped flecainide. She was changed to Pradaxa after the last stroke.  She  was sent here to discuss options to return to SR. I do not believe she will an ablation candidate as she had a recent stroke and her rt and left  upper chambers are severely dilated. Her EKG showed v rate at 165 bpm. However, pulse ox worn during the visit, showed heart rates in the 100's. They are using 30 mg cardizem as needed to control v rates at home as well at her 240 mg daily. It appears that her best options to get back in rhythm would be amiodarone or tikosyn, but either way the pt and her husband are very leary re a repeat cardioversion. She would not be an ablation candidate for her recent stroke and enlarged atrium. She appears to be tolerating the afib ok.   F/u in afib clinic, 10/25/2, after increase of Cardizem for better rate control  as the pt and her husband did not want to pursue SR if that meant another cardioversion for fear of repeat stroke. Her v rate is 140 in afib today, but her readings at home show 93 to 116  bpm. Pulse ox during the visit showed HR's 80's to 120 bpm. She states that  she feels well. She  has not noted any adverse effect from  increase in Cardizem. Pt  and husband  still hesitant to consider any plans that may require another cardioversion. She is being compliant with her Pradaxa for CHA2DS2VASc score of 6.  I did mention that a TEE could be done with another cardioversion attempt if needed to minimize  her stroke risk and tikosyn itself may convert her with a cardioversion needed.  F/u in afib clinic 10/28. She has been started on metoprolol 12.5 mg bid in addition to Cardizem. Her HR by her husband's V/S checks look  Improved,  she feels well. Her BP on presentation here was 88/56, but recheck by me 120/60. Her BP's  at home look stable . HR's are trending down to 80's and 90's.   Today, she denies symptoms of palpitations, chest pain, shortness of breath, orthopnea, PND, lower extremity edema, dizziness, presyncope, syncope, or neurologic sequela. The patient is tolerating medications without difficulties and is otherwise without complaint today.   Past Medical History:  Diagnosis Date  . Allergy   . CVA (cerebral vascular accident) ([place])   . Hyperlipidemia LDL goal <70   . Hypertension   . Palpitations   . Vertigo, benign positional    Past Surgical History:  Procedure Laterality Date  .  CARDIOVERSION Left 10/31/2018   Procedure: CARDIOVERSION;  Surgeon: Acie Fredrickson Wonda Cheng, MD;  Location: [place];  Service: Cardiovascular;  Laterality: Left;  . CARDIOVERSION N/A 01/08/2020   Procedure: CARDIOVERSION;  Surgeon: [name], MD;  Location: [facility] ENDOSCOPY;  Service: Cardiovascular;  Laterality: N/A;  . Hemilaminectomy and microdiskectomy at L4-5 on the left.  12/10/2003  . TEE WITHOUT CARDIOVERSION N/A 06/05/2018   Procedure: TRANSESOPHAGEAL ECHOCARDIOGRAM (TEE);  Surgeon: Lelon Perla, MD;  Location: [facility] ENDOSCOPY;  Service: Cardiovascular;  Laterality: N/A;  loop    Current Outpatient Medications  Medication Sig Dispense Refill  .  atorvastatin (LIPITOR) 80 MG tablet TAKE 1 TABLET (80 MG TOTAL) BY MOUTH DAILY AT 6 PM. 30 tablet 0  . diltiazem (CARDIZEM CD) 120 MG 24 hr capsule Take 1 capsule (120 mg total) by mouth at bedtime. 30 capsule 3  . diltiazem (CARDIZEM CD) 240 MG 24 hr capsule Take 1 capsule (240 mg total) by mouth daily. 90 capsule 3  . diltiazem (CARDIZEM) 30 MG tablet Take 1 tablet (30 mg total) by mouth 4 (four) times daily as needed. Prn 120 tablet 11  . KLOR-CON M10 10 MEQ tablet TAKE 1 TABLET BY MOUTH EVERY DAY 30 tablet 0  . metoprolol tartrate (LOPRESSOR) 25 MG tablet Take 0.5 tablets (12.5 mg total) by mouth 2 (two) times daily. 90 tablet 3  . PRADAXA 150 MG CAPS capsule TAKE 1 CAPSULE (150 MG TOTAL) BY MOUTH EVERY 12 (TWELVE) HOURS. 60 capsule 0   No current facility-administered medications for this encounter.   Facility-Administered Medications Ordered in Other Encounters  Medication Dose Route Frequency Provider Last Rate Last Admin  . regadenoson (LEXISCAN) injection SOLN 0.4 mg  0.4 mg Intravenous Once Buford Dresser, MD      . technetium tetrofosmin (TC-MYOVIEW) injection 32 millicurie  32 millicurie Intravenous Once PRN Buford Dresser, MD        Allergies  Allergen Reactions  . Duloxetine     Causes Bp elevation.  Increased body jerks, nausea  . Acetaminophen Hives and Rash    Social History   Socioeconomic History  . Marital status: Married    Spouse name: Not on file  . Number of children: Not on file  . Years of education: Not on file  . Highest education level: Not on file  Occupational History  . Not on file  Tobacco Use  . Smoking status: Never Smoker  . Smokeless tobacco: Never Used  Vaping Use  . Vaping Use: Never used  Substance and Sexual Activity  . Alcohol use: No  . Drug use: No  . Sexual activity: Not on file  Other Topics Concern  . Not on file  Social History Narrative  . Not on file   Social Determinants of Health   Financial Resource  Strain:   . Difficulty of Paying Living Expenses: Not on file  Food Insecurity:   . Worried About Charity fundraiser in the Last Year: Not on file  . Ran Out of Food in the Last Year: Not on file  Transportation Needs:   . Lack of Transportation (Medical): Not on file  . Lack of Transportation (Non-Medical): Not on file  Physical Activity:   . Days of Exercise per Week: Not on file  . Minutes of Exercise per Session: Not on file  Stress:   . Feeling of Stress : Not on file  Social Connections:   . Frequency of Communication with Friends and Family: Not on file  .  Frequency of Social Gatherings with Friends and Family: Not on file  . Attends Religious Services: Not on file  . Active Member of Clubs or Organizations: Not on file  . Attends Archivist Meetings: Not on file  . Marital Status: Not on file  Intimate Partner Violence:   . Fear of Current or Ex-Partner: Not on file  . Emotionally Abused: Not on file  . Physically Abused: Not on file  . Sexually Abused: Not on file    Family History  Problem Relation Age of Onset  . Colon cancer Father   . Diabetes Mother   . Heart attack Mother        24  . Heart attack Brother 71    ROS- All systems are reviewed and negative except as per the HPI above  Physical Exam: Vitals:   02/20/20 1441  BP: (!) 88/56  Pulse: (!) 112  Weight: 61.1 kg  Height: 5\' 5"  (1.651 m)   Wt Readings from Last 3 Encounters:  02/20/20 61.1 kg  02/17/20 60.9 kg  02/12/20 60.6 kg    Labs: Lab Results  Component Value Date   NA 139 01/20/2020   K 4.1 01/20/2020   CL 103 01/20/2020   CO2 30 01/20/2020   GLUCOSE 119 (H) 01/20/2020   BUN 9 01/20/2020   CREATININE 0.63 01/20/2020   CALCIUM 9.4 01/20/2020   MG 1.8 01/14/2020   Lab Results  Component Value Date   INR 1.3 (H) 01/09/2020   Lab Results  Component Value Date   CHOL 183 01/10/2020   HDL 92 01/10/2020   LDLCALC 81 01/10/2020   TRIG 52 01/10/2020     GEN-  The patient is well appearing, alert and oriented x 3 today.   Head- normocephalic, atraumatic Eyes-  Sclera clear, conjunctiva pink Ears- hearing intact Oropharynx- clear Neck- supple, no JVP Lymph- no cervical lymphadenopathy Lungs- Clear to ausculation bilaterally, normal work of breathing Hear irregular rate and rhythm, no murmurs, rubs or gallops, PMI not laterally displaced GI- soft, NT, ND, + BS Extremities- no clubbing, cyanosis, or edema MS- no significant deformity or atrophy Skin- no rash or lesion Psych- euthymic mood, full affect Neuro- strength and sensation are intact  EKG-afib at 112 bpm , qrs int 76 bpm, qtc 421 ms  (HR's  at home 80's to 90's) Echo-1. The left ventricle has normal systolic function of 46-65%. The cavity  size was normal. There is no increased left ventricular wall thickness.  Echo evidence of pseudonormalization in diastolic relaxation and  indeterminate left ventricular filling  pressure.  2. Grade 2 diastolic dysfunction.  3. The right ventricle has normal systolic function. The cavity was  normal. There is no increase in right ventricular wall thickness.  4. Left atrial size was severely dilated.  5. Right atrial size was severely dilated.  6. The mitral valve is normal in structure.  7. The tricuspid valve is normal in structure. Tricuspid valve  regurgitation is moderate.  8. The aortic valve is normal in structure. There is moderate thickening  and moderate calcification of the aortic valve.  9. The pulmonic valve was normal in structure.      Assessment and Plan: 1. 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 Rayann Heman  She will not be a candidate for ablation for her recent stroke and severly dilated atrium The pt and her husband are very  fearful of another cardioversion which will likely if we try to restore SR I talked to  them at length re options of amiodarone and tikosyn, risk and benefit of both For now, the would prefer to avoid efforts  to return to SR and would like to try  rate control with the addition of metoprolol 12.5 mg bid to  to 240 mg Cardizem in the am and 120 Cardizem in the pm  Dr. Acie Fredrickson is in agreement with rate control   BP's at home and on recheck here look stable but husband  will watch BP/HR over the weekend and call in v/s on Monday   2. CHA2DS2VASc score of at least 6 Continue  pradaxa 150 mg bid   Follow up will be after I review v/s on Monday   Mary Mercado, [facility] [address] [place], [place] [address] [phone]

## 2020-02-24 ENCOUNTER — Telehealth (HOSPITAL_COMMUNITY): Payer: Self-pay

## 2020-02-24 NOTE — Telephone Encounter (Signed)
Patient's husband called in regarding her blood pressure readings 10/29 @ 10:30am-114/86 HR 103  10/29 @ 7:30pm- 101/84 HR 122 10/30 @ 10:30am- 105/83 HR 77   10/30 @ 8pm- 96/74 HR 98 10/31 @ 10:30am- 117/81 HR 91   10/31 @ 8:11pm 143/80 HR 93 11/1   @ 10:00am -116/68  HR 101  Contacted patient about her blood pressure readings. Her readings look great. She is scheduled to see [place] back on 11/8 @ 2:30pm with Ceasar Lund. Patient verbalized understanding.

## 2020-02-25 ENCOUNTER — Ambulatory Visit: Payer: Medicare Other

## 2020-02-25 ENCOUNTER — Ambulatory Visit: Payer: Medicare Other | Attending: Physical Medicine & Rehabilitation

## 2020-02-25 ENCOUNTER — Other Ambulatory Visit: Payer: Self-pay

## 2020-02-25 VITALS — BP 92/58 | HR 72

## 2020-02-25 DIAGNOSIS — R471 Dysarthria and anarthria: Secondary | ICD-10-CM

## 2020-02-25 DIAGNOSIS — R41840 Attention and concentration deficit: Secondary | ICD-10-CM | POA: Diagnosis not present

## 2020-02-25 DIAGNOSIS — R278 Other lack of coordination: Secondary | ICD-10-CM | POA: Diagnosis not present

## 2020-02-25 DIAGNOSIS — R2689 Other abnormalities of gait and mobility: Secondary | ICD-10-CM | POA: Insufficient documentation

## 2020-02-25 DIAGNOSIS — M6281 Muscle weakness (generalized): Secondary | ICD-10-CM | POA: Insufficient documentation

## 2020-02-25 DIAGNOSIS — R41844 Frontal lobe and executive function deficit: Secondary | ICD-10-CM | POA: Diagnosis not present

## 2020-02-25 DIAGNOSIS — R41842 Visuospatial deficit: Secondary | ICD-10-CM | POA: Diagnosis not present

## 2020-02-25 DIAGNOSIS — R2681 Unsteadiness on feet: Secondary | ICD-10-CM | POA: Diagnosis not present

## 2020-02-25 NOTE — Addendum Note (Signed)
Addended by: Garald Balding B on: 02/25/2020 05:34 PM   Modules accepted: Orders

## 2020-02-25 NOTE — Therapy (Signed)
[facility] [address] [place], [place], [address] Phone: [phone]   Fax:  [phone]  Physical Therapy Treatment  Patient Details  Name: Mary Mercado MRN: [id number] Date of Birth: 08/14/1940 Referring Provider (PT): [name], [facility]   Encounter Date: 02/25/2020   PT End of Session - 02/25/20 1100    Visit Number 3    Number of Visits 17    Date for PT Re-Evaluation 05/13/20    Authorization Type Medicare (PN every 10th visit) and BCBS supplemental    PT Start Time 1059    PT Stop Time 1142    PT Time Calculation (min) 43 min    Equipment Utilized During Treatment --    Activity Tolerance Patient tolerated treatment well    Behavior During Therapy [facility] for tasks assessed/performed;Impulsive           Past Medical History:  Diagnosis Date  . Allergy   . CVA (cerebral vascular accident) ([place])   . Hyperlipidemia LDL goal <70   . Hypertension   . Palpitations   . Vertigo, benign positional     Past Surgical History:  Procedure Laterality Date  . CARDIOVERSION Left 10/31/2018   Procedure: CARDIOVERSION;  Surgeon: Acie Fredrickson Wonda Cheng, MD;  Location: [place];  Service: Cardiovascular;  Laterality: Left;  . CARDIOVERSION N/A 01/08/2020   Procedure: CARDIOVERSION;  Surgeon: [name], MD;  Location: [facility] ENDOSCOPY;  Service: Cardiovascular;  Laterality: N/A;  . Hemilaminectomy and microdiskectomy at L4-5 on the left.  12/10/2003  . TEE WITHOUT CARDIOVERSION N/A 06/05/2018   Procedure: TRANSESOPHAGEAL ECHOCARDIOGRAM (TEE);  Surgeon: Lelon Perla, MD;  Location: [facility] ENDOSCOPY;  Service: Cardiovascular;  Laterality: N/A;  loop    Vitals:   02/25/20 1102 02/25/20 1105 02/25/20 1106  BP: 128/70 100/60 (!) 92/58  Pulse:  72      Subjective Assessment - 02/25/20 1100    Subjective Pt reports that she continues to have the dizziness ( not like vertigo). She reports that she just feels  it internally and unsteady. Reports vitals were good this morning.    Pertinent History CAD, hx of CVA, PAF s/p DCCV, BPPV, a-fib (currently undergoing testing to stop a-fib but was told not a candidate for ablation and might have to live with a-fib), HLD    Patient Stated Goals To be able to feel good when I walk, improve endurance    Currently in Pain? No/denies                             [facility] Adult PT Treatment/Exercise - 02/25/20 1102      Transfers   Transfers Sit to Stand;Stand to Sit    Sit to Stand 5: Supervision    Stand to Sit 5: Supervision      Ambulation/Gait   Ambulation/Gait Yes    Ambulation/Gait Assistance 5: Supervision    Ambulation/Gait Assistance Details around in clinic between activities     Assistive device Rollator    Gait Pattern Step-through pattern    Ambulation Surface Level;Indoor      Therapeutic Activites    Therapeutic Activities Other Therapeutic Activities    Other Therapeutic Activities Pt was instructed on changing positions slowly due to dropping BP. Also advised to try doing some ankle pumps, LAQ, marches prior to getting up to get BP up some. Pt reports that she is drinking water good as well. Advised not to walk away  from chair if feeling at all light headed.               Balance Exercises - 02/25/20 1124      OTAGO PROGRAM   Head Movements Sitting;Other reps (comment)   10   Neck Movements Sitting;Other reps (comment)   10   Back Extension Sitting;Other reps (comment)   10   Trunk Movements Standing;Other reps (comment)   10   Ankle Movements Sitting;Other reps (comment)   10   Knee Extensor 10 reps    Knee Flexor 10 reps    Hip ABductor 5 reps   x 2   Backwards Walking Support    Heel Walking Support    Toe Walk Support    Sit to Stand 10 reps, no support    Overall OTAGO Comments Pt was given verbal cues for form. Also instructed to break up standing strengthening 5 x 2 reps to give stance limb a break as  fatigued.             PT Education - 02/25/20 1153    Education Details Educated again on orthostatic BP and ways to help. Also added to OTAGO HEP    Person(s) Educated Patient;Spouse    Methods Explanation;Demonstration;Handout    Comprehension Verbalized understanding;Returned demonstration            PT Short Term Goals - 02/20/20 1350      PT SHORT TERM GOAL #1   Title Pt will be independent with HEP for improved strength, balance, dizziness, and gait.  TARGET DATE FOR ALL STGS: 03/12/20    Time 4    Period Weeks    Status New      PT SHORT TERM GOAL #2   Title Perform BERG and DGI writes goals as indicated.    Time 4    Period Weeks    Status Achieved      PT SHORT TERM GOAL #3   Title Pt will improved gait speed to >/=2.33ft/sec. with LRAD to improve gait efficiency.    Baseline 2.25ft/sec with rollator    Time 4    Period Weeks    Status New      PT SHORT TERM GOAL #4   Title Pt will perform TUG with LRAD in </=13.5 sec. to decr. falls risk.    Baseline 18.16sec. with rollator    Time 4    Period Weeks    Status New      PT SHORT TERM GOAL #5   Title Pt will amb. 300' at MOD I level with LRAD over even terrain to improve functional mobility.    Time 4    Period Weeks    Status New      Additional Short Term Goals   Additional Short Term Goals Yes      PT SHORT TERM GOAL #6   Title Pt will improve BERG score to >/=48/56 to decr. falls risk.    Baseline 44/56    Time 4    Period Weeks    Status New      PT SHORT TERM GOAL #7   Title Pt will improve DGI score to >/=14/24 to decr. falls risk.    Baseline 11/24    Time 4    Period Weeks    Status New             PT Long Term Goals - 02/20/20 1351      PT LONG TERM GOAL #1  Title Pt will verbalize CVA s/s to decr. risk of additional CVA. TARGET DATE FOR ALL LTGS: 04/09/20    Time 8    Period Weeks    Status New      PT LONG TERM GOAL #2   Title Pt will improve gait with LRAD to  >/=2.61ft/sec. to safely amb. in the community.    Baseline 2.101ft/sec with rollator    Time 8    Period Weeks    Status New      PT LONG TERM GOAL #3   Title Pt will amb. 600' with LRAD at MOD I level oven even and uneven terrain to improve functional mobility.    Time 8    Period Weeks    Status New      PT LONG TERM GOAL #4   Title Perform vestibular assessment as indicated and write goals as needed.    Time 8    Period Weeks    Status New      PT LONG TERM GOAL #5   Title Pt will improve BERG score to >/=52/56 to decr. falls risk.    Baseline 44/56    Time 8    Period Weeks    Status New      Additional Long Term Goals   Additional Long Term Goals Yes      PT LONG TERM GOAL #6   Title Pt will improve DGI score to >/=16/24 to decr. falls risk.    Baseline 11/24    Time 8    Period Weeks    Status New                 Plan - 02/25/20 1154    Clinical Impression Statement PT assessed orthostatics today. Pt was orthostatic between supine to sit with significant drop as well as  continued drop to standing but not as signficant. Provided education on this. Continued to progress OTAGO HEP with allowing support on balance exercises.    Personal Factors and Comorbidities Age;Comorbidity 3+    Comorbidities CAD, hx of CVA, PAF s/p DCCV, BPPV, a-fib (currently undergoing testing to stop a-fib but was told not a candidate for ablation and might have to live with a-fib), HLD    Examination-Activity Limitations Bed Mobility;Bend;Bathing;Carry;Stand;Squat;Stairs;Toileting;Dressing;Transfers;Locomotion Level    Examination-Participation Restrictions Church;Yard Work;Driving;Community Activity;Laundry;Meal Prep;Cleaning    Stability/Clinical Decision Making Evolving/Moderate complexity    Rehab Potential Good    PT Frequency 2x / week    PT Duration 8 weeks    PT Treatment/Interventions ADLs/Self Care Home Management;Aquatic Therapy;Biofeedback;Canalith Repostioning;Balance  training;Therapeutic exercise;Manual techniques;Vestibular;Therapeutic activities;Functional mobility training;Stair training;Gait training;Orthotic Fit/Training;Patient/family education;DME Instruction;Neuromuscular re-education    PT Next Visit Plan Monitor BP and signs for orthostatic hypotension.  Provide CVA ed handout. Continue with strengthening and balance progression. Pt has been started on OTAGO.    Consulted and Agree with Plan of Care Patient;Family member/caregiver    Family Member Consulted pt's husband: [name]           Patient will benefit from skilled therapeutic intervention in order to improve the following deficits and impairments:  Abnormal gait, Decreased endurance, Impaired sensation, Cardiopulmonary status limiting activity, Decreased knowledge of use of DME, Decreased activity tolerance, Decreased strength, Decreased balance, Decreased mobility, Dizziness, Impaired flexibility  Visit Diagnosis: Other abnormalities of gait and mobility  Muscle weakness (generalized)  Unsteadiness on feet     Problem List Patient Active Problem List   Diagnosis Date Noted  . Hemiplegia and hemiparesis following cerebral infarction  affecting left non-dominant side ([place]) 02/05/2020  . 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 CVA (cerebral vascular accident) ([place]) 01/09/2020  . Dizziness 11/30/2018  . Atrial fibrillation ([place]) 08/13/2018  . Prediabetes 06/15/2018  . Palpitations 06/05/2018  . Essential hypertension 06/05/2018  . Hyperlipidemia LDL goal <70 06/05/2018  . Acute ischemic right MCA stroke ([place]) 06/05/2018  . Stroke (cerebrum) ([place]) - R MCA s/p tPA, embolic, source unknown 37/44/5146  . SVT (supraventricular tachycardia) ([place]) 05/28/2015  . PVC's (premature ventricular contractions) 05/28/2015  . BENIGN POSITIONAL VERTIGO 06/16/2009  . ALLERGIC RHINITIS 06/16/2009    Electa Sniff, PT, DPT, NCS 02/25/2020, 11:58 AM  [facility] [address] [place], [place], [address] Phone: [phone]   Fax:  [phone]  Name: Mary Mercado MRN: [id number] Date of Birth: 08/06/1940

## 2020-02-25 NOTE — Patient Instructions (Signed)
Added to [place] program: Seated knee extension x 10 Standing knee flexion 5 x 2,  Standing hip abd 5 x 2 Along counter forwards/backwards walking with support with husband supervising Along counter heel walking with support with husband supervising Sit to stand x 10 without hands.

## 2020-02-25 NOTE — Therapy (Signed)
[facility] [address] [place], [place], [address] Phone: [phone]   Fax:  [phone]  Speech Language Pathology Evaluation  Patient Details  Name: Mary Mercado MRN: [id number] Date of Birth: 22-Feb-1941 Referring Provider (SLP): [name], MD   Encounter Date: 02/25/2020   End of Session - 02/25/20 1723    Visit Number 1    Number of Visits 9    Date for SLP Re-Evaluation 04/17/20    SLP Start Time 1150    SLP Stop Time  1230    SLP Time Calculation (min) 40 min    Activity Tolerance Patient tolerated treatment well           Past Medical History:  Diagnosis Date  . Allergy   . CVA (cerebral vascular accident) ([place])   . Hyperlipidemia LDL goal <70   . Hypertension   . Palpitations   . Vertigo, benign positional     Past Surgical History:  Procedure Laterality Date  . CARDIOVERSION Left 10/31/2018   Procedure: CARDIOVERSION;  Surgeon: Acie Fredrickson Wonda Cheng, MD;  Location: [facility];  Service: Cardiovascular;  Laterality: Left;  . CARDIOVERSION N/A 01/08/2020   Procedure: CARDIOVERSION;  Surgeon: [name], MD;  Location: [facility] ENDOSCOPY;  Service: Cardiovascular;  Laterality: N/A;  . Hemilaminectomy and microdiskectomy at L4-5 on the left.  12/10/2003  . TEE WITHOUT CARDIOVERSION N/A 06/05/2018   Procedure: TRANSESOPHAGEAL ECHOCARDIOGRAM (TEE);  Surgeon: Lelon Perla, MD;  Location[facility] ENDOSCOPY;  Service: Cardiovascular;  Laterality: N/A;  loop    There were no vitals filed for this visit.       SLP Evaluation [facility] - 02/25/20 1207      SLP Visit Information   SLP Received On 02/25/20    Referring Provider (SLP) [name], MD    Onset Date 01-09-20    Medical Diagnosis CVA      Subjective   Patient/Family Stated Goal "Speak in such a way that nobody would ever know what happened."      General Information   HPI Pt with CVA 01-09-20, had 3-4 ST sessions on  acute prior to ST on CIR. Discharged 01-22-20. Has been working on overarticulation since that time by reading out loud.       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   Overall Cognitive Status Within Functional Limits for tasks assessed      Auditory Comprehension   Overall Auditory Comprehension Appears within functional limits for tasks assessed      Verbal Expression   Overall Verbal Expression Appears within functional limits for tasks assessed      Oral Motor/Sensory Function   Overall Oral Motor/Sensory Function Impaired    Labial Strength Within Functional Limits    Labial Coordination Reduced    Lingual Symmetry Within Functional Limits    Lingual Strength Reduced Left   very slightly reduced   Lingual Coordination Reduced   observed more with upper sweep      Motor Speech   Overall Motor Speech Impaired    Respiration Within functional limits    Phonation Normal   low 70s dB throughout eval   Resonance Within functional limits    Intelligibility Intelligible  Effective Techniques Slow rate;Over-articulate                           SLP Education - 02/25/20 1722    Education Details HEP procedure, pt will cont to progress but may not make her baseline    Person(s) Educated Patient;Spouse    Methods Explanation;Demonstration;Verbal cues;Handout    Comprehension Verbalized understanding;Returned demonstration;Verbal cues required;Need further instruction              SLP Long Term Goals - 02/25/20 1728      SLP LONG TERM GOAL #1   Title pt will maintain intelligibility of 95%+ in 15 minutes mod complex conversation over 2 sessions    Time 6    Period Weeks    Status New      SLP LONG TERM GOAL #2    Title pt will improve her speech-related QOL score by 8 points from date of eval (questionairre to be returned by pt on 1st therapy visit)    Time 6    Period Weeks    Status New            Plan - 02/25/20 1724    Clinical Impression Statement Pt presents today with mild dysarthria caused by min decr'd coordination of lingual and labial musculature. Mary Mercado is 95-100% intelligible today with SLP in 10 minutes mod complex conversation. Pt and husband state pt's speech is more dysarthric after 9pm. SLP suggested pt take rest earlier in the day if she desires, to conserve energy and improve intelligibility further into the evening. Pt would benefit from a short course of skilled ST to target correct procedure with her HEP provided today (see pt instructions) and her use of compensatory strategies of overarticulate and slow rate.    Speech Therapy Frequency 1x /week    Duration 8 weeks    Treatment/Interventions Other (comment);Functional tasks;Compensatory techniques;SLP instruction and feedback;Patient/family education;Internal/external aids   HEP for articulation   Potential to Achieve Goals Good    SLP Home Exercise Plan provided    Consulted and Agree with Plan of Care Patient           Patient will benefit from skilled therapeutic intervention in order to improve the following deficits and impairments:   Dysarthria and anarthria    Problem List Patient Active Problem List   Diagnosis Date Noted  . Hemiplegia and hemiparesis following cerebral infarction affecting left non-dominant side ([place]) 02/05/2020  . 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 CVA (cerebral vascular accident) ([place]) 01/09/2020  . Dizziness 11/30/2018  . Atrial fibrillation ([place]) 08/13/2018  . Prediabetes 06/15/2018  . Palpitations 06/05/2018  . Essential hypertension 06/05/2018  . Hyperlipidemia LDL goal <70 06/05/2018  . Acute ischemic right MCA stroke ([place]) 06/05/2018  . Stroke  (cerebrum) ([place]) - R MCA s/p tPA, embolic, source unknown 63/14/9702  . SVT (supraventricular tachycardia) ([place]) 05/28/2015  . PVC's (premature ventricular contractions) 05/28/2015  . BENIGN POSITIONAL VERTIGO 06/16/2009  . ALLERGIC RHINITIS 06/16/2009    [facility] ,[place], CCC-SLP  02/25/2020, 5:31 PM  [facility] [address] [place], [place], [address] Phone: [phone]   Fax:  [phone]  Name: Mary Mercado MRN: [id number] Date of Birth: [birth year]

## 2020-02-25 NOTE — Patient Instructions (Signed)
   Speech Exercises  Repeat each phrase 2 times, 3 times a day  Call the cat "Buttercup" A calendar of [place], [place] Four floors to cover Yellow oil ointment Fellow lovers of felines Catastrophe in [place]' plums The church's chimes chimed Telling time 'til eleven Five valve levers Keep the gate closed Go see that guy Fat cows give milk [employer] Gophers Fat frogs flip freely Kohl's into bed Get that game to [employer] Thick thistles stick together Cinnamon aluminum linoleum Black bugs blood Lovely lemon linament Red leather, yellow leather  Big grocery buggy    Purple baby carriage [facility] Proper copper coffee pot Ripe purple cabbage Three free throws [employer] tackled  [employer] dipped the dessert  [facility] that [employer] of [employer] Shirts shrink, shells shouldn't [place] [employer] Take the tackle box File the flash message Give me five flapjacks Fundamental relatives Dye the pets purple Talking [place] time after time Dark chocolate chunks Political landscape of the kingdom Estate manager/land agent genius We played yo-yos yesterday We all like [place]

## 2020-02-27 ENCOUNTER — Ambulatory Visit (INDEPENDENT_AMBULATORY_CARE_PROVIDER_SITE_OTHER): Payer: Medicare Other | Admitting: Neurology

## 2020-02-27 ENCOUNTER — Other Ambulatory Visit: Payer: Self-pay

## 2020-02-27 ENCOUNTER — Encounter: Payer: Self-pay | Admitting: Neurology

## 2020-02-27 ENCOUNTER — Ambulatory Visit: Payer: Medicare Other

## 2020-02-27 VITALS — BP 139/80 | HR 91 | Ht 65.0 in | Wt 137.0 lb

## 2020-02-27 DIAGNOSIS — Z8673 Personal history of transient ischemic attack (TIA), and cerebral infarction without residual deficits: Secondary | ICD-10-CM | POA: Diagnosis not present

## 2020-02-27 DIAGNOSIS — R0683 Snoring: Secondary | ICD-10-CM

## 2020-02-27 DIAGNOSIS — R351 Nocturia: Secondary | ICD-10-CM | POA: Diagnosis not present

## 2020-02-27 DIAGNOSIS — R41844 Frontal lobe and executive function deficit: Secondary | ICD-10-CM | POA: Diagnosis not present

## 2020-02-27 DIAGNOSIS — I639 Cerebral infarction, unspecified: Secondary | ICD-10-CM

## 2020-02-27 DIAGNOSIS — M6281 Muscle weakness (generalized): Secondary | ICD-10-CM | POA: Diagnosis not present

## 2020-02-27 DIAGNOSIS — R2681 Unsteadiness on feet: Secondary | ICD-10-CM | POA: Diagnosis not present

## 2020-02-27 DIAGNOSIS — R2689 Other abnormalities of gait and mobility: Secondary | ICD-10-CM

## 2020-02-27 DIAGNOSIS — I63511 Cerebral infarction due to unspecified occlusion or stenosis of right middle cerebral artery: Secondary | ICD-10-CM

## 2020-02-27 DIAGNOSIS — I4819 Other persistent atrial fibrillation: Secondary | ICD-10-CM | POA: Diagnosis not present

## 2020-02-27 DIAGNOSIS — R278 Other lack of coordination: Secondary | ICD-10-CM | POA: Diagnosis not present

## 2020-02-27 DIAGNOSIS — R41842 Visuospatial deficit: Secondary | ICD-10-CM | POA: Diagnosis not present

## 2020-02-27 NOTE — Therapy (Signed)
[name] [address] [place], [place], [address] Phone: [phone]   Fax:  [phone]  Physical Therapy Treatment  Patient Details  Name: Mary Mercado MRN: [id number] Date of Birth: Aug 19, 1940 Referring Provider (PT): [name], [facility]   Encounter Date: 02/27/2020   PT End of Session - 02/27/20 1143    Visit Number 4    Number of Visits 17    Date for PT Re-Evaluation 05/13/20    Authorization Type Medicare (PN every 10th visit) and BCBS supplemental    PT Start Time 1101    PT Stop Time 1140    PT Time Calculation (min) 39 min    Equipment Utilized During Treatment Gait belt    Activity Tolerance Patient tolerated treatment well;Patient limited by fatigue    Behavior During Therapy [facility] for tasks assessed/performed           Past Medical History:  Diagnosis Date  . Allergy   . CVA (cerebral vascular accident) ([place])   . Hyperlipidemia LDL goal <70   . Hypertension   . Palpitations   . Vertigo, benign positional     Past Surgical History:  Procedure Laterality Date  . CARDIOVERSION Left 10/31/2018   Procedure: CARDIOVERSION;  Surgeon: Acie Fredrickson Wonda Cheng, MD;  Location: [facility];  Service: Cardiovascular;  Laterality: Left;  . CARDIOVERSION N/A 01/08/2020   Procedure: CARDIOVERSION;  Surgeon: [name], MD;  Location: [facility] ENDOSCOPY;  Service: Cardiovascular;  Laterality: N/A;  . Hemilaminectomy and microdiskectomy at L4-5 on the left.  12/10/2003  . TEE WITHOUT CARDIOVERSION N/A 06/05/2018   Procedure: TRANSESOPHAGEAL ECHOCARDIOGRAM (TEE);  Surgeon: Lelon Perla, MD;  Location: [facility] ENDOSCOPY;  Service: Cardiovascular;  Laterality: N/A;  loop    There were no vitals filed for this visit.   Subjective Assessment - 02/27/20 1104    Subjective Pt denied falls or changes since last visit. Pt did have speech eval and it went well. Pt has been performing OTAGO HEP and reports it's  moderately challenging. BP has improved.    Patient is accompained by: Family member    Pertinent History CAD, hx of CVA, PAF s/p DCCV, BPPV, a-fib (currently undergoing testing to stop a-fib but was told not a candidate for ablation and might have to live with a-fib), HLD    Patient Stated Goals To be able to feel good when I walk, improve endurance    Currently in Pain? No/denies                             [facility] Adult PT Treatment/Exercise - 02/27/20 1112      Ambulation/Gait   Ambulation/Gait Yes    Ambulation/Gait Assistance 4: Min guard    Ambulation/Gait Assistance Details Cues and demo for sequencing with SBQC and SPC.     Ambulation Distance (Feet) 115 Feet   x2 and 6x10' with each AD, 150' with [facility]   Assistive device Straight cane;Small based quad cane    Gait Pattern Step-through pattern;Decreased stride length    Ambulation Surface Level;Indoor    Gait velocity 2.17ft/sec. and 2.40ft/sec with SPC    Gait Comments BP after amb: 118/81 and HR: 78bpm. Seated rest breaks required 2/2 fatigue.                   PT Education - 02/27/20 1107    Education Details PT educated pt on CVA ed. PT discussed progression of  gait training with SPC and no AD vs. rollator. PT added pt amb. with SPC (with quad attachment) at home in kitchen to improve endurance and sequencing.    Person(s) Educated Patient;Spouse    Methods Explanation;Handout    Comprehension Verbalized understanding;Returned demonstration            PT Short Term Goals - 02/20/20 1350      PT SHORT TERM GOAL #1   Title Pt will be independent with HEP for improved strength, balance, dizziness, and gait.  TARGET DATE FOR ALL STGS: 03/12/20    Time 4    Period Weeks    Status New      PT SHORT TERM GOAL #2   Title Perform BERG and DGI writes goals as indicated.    Time 4    Period Weeks    Status Achieved      PT SHORT TERM GOAL #3   Title Pt will improved gait speed to >/=2.40ft/sec.  with LRAD to improve gait efficiency.    Baseline 2.60ft/sec with rollator    Time 4    Period Weeks    Status New      PT SHORT TERM GOAL #4   Title Pt will perform TUG with LRAD in </=13.5 sec. to decr. falls risk.    Baseline 18.16sec. with rollator    Time 4    Period Weeks    Status New      PT SHORT TERM GOAL #5   Title Pt will amb. 300' at MOD I level with LRAD over even terrain to improve functional mobility.    Time 4    Period Weeks    Status New      Additional Short Term Goals   Additional Short Term Goals Yes      PT SHORT TERM GOAL #6   Title Pt will improve BERG score to >/=48/56 to decr. falls risk.    Baseline 44/56    Time 4    Period Weeks    Status New      PT SHORT TERM GOAL #7   Title Pt will improve DGI score to >/=14/24 to decr. falls risk.    Baseline 11/24    Time 4    Period Weeks    Status New             PT Long Term Goals - 02/20/20 1351      PT LONG TERM GOAL #1   Title Pt will verbalize CVA s/s to decr. risk of additional CVA. TARGET DATE FOR ALL LTGS: 04/09/20    Time 8    Period Weeks    Status New      PT LONG TERM GOAL #2   Title Pt will improve gait with LRAD to >/=2.1ft/sec. to safely amb. in the community.    Baseline 2.22ft/sec with rollator    Time 8    Period Weeks    Status New      PT LONG TERM GOAL #3   Title Pt will amb. 600' with LRAD at MOD I level oven even and uneven terrain to improve functional mobility.    Time 8    Period Weeks    Status New      PT LONG TERM GOAL #4   Title Perform vestibular assessment as indicated and write goals as needed.    Time 8    Period Weeks    Status New      PT LONG TERM GOAL #  5   Title Pt will improve BERG score to >/=52/56 to decr. falls risk.    Baseline 44/56    Time 8    Period Weeks    Status New      Additional Long Term Goals   Additional Long Term Goals Yes      PT LONG TERM GOAL #6   Title Pt will improve DGI score to >/=16/24 to decr. falls  risk.    Baseline 11/24    Time 8    Period Weeks    Status New                 Plan - 02/27/20 1143    Clinical Impression Statement Pt demonstrated progress as she was able to progress to amb. with SPC during session. Pt's gait speed with SPC is above falls risk but below community amb. speed. PT recommends continued use of rollator as pt continues to be limited by fatigue and PT will continue gait training with cane during session. Pt would continue to benefit from skilled PT to improve safety during functional mobility.    Personal Factors and Comorbidities Age;Comorbidity 3+    Comorbidities CAD, hx of CVA, PAF s/p DCCV, BPPV, a-fib (currently undergoing testing to stop a-fib but was told not a candidate for ablation and might have to live with a-fib), HLD    Examination-Activity Limitations Bed Mobility;Bend;Bathing;Carry;Stand;Squat;Stairs;Toileting;Dressing;Transfers;Locomotion Level    Examination-Participation Restrictions Church;Yard Work;Driving;Community Activity;Laundry;Meal Prep;Cleaning    Stability/Clinical Decision Making Evolving/Moderate complexity    Rehab Potential Good    PT Frequency 2x / week    PT Duration 8 weeks    PT Treatment/Interventions ADLs/Self Care Home Management;Aquatic Therapy;Biofeedback;Canalith Repostioning;Balance training;Therapeutic exercise;Manual techniques;Vestibular;Therapeutic activities;Functional mobility training;Stair training;Gait training;Orthotic Fit/Training;Patient/family education;DME Instruction;Neuromuscular re-education    PT Next Visit Plan Monitor BP and signs for orthostatic hypotension.  Pt has been started on OTAGO. Gait training with [place] with quad attachment (as that's what pt has at home.    Consulted and Agree with Plan of Care Patient;Family member/caregiver    Family Member Consulted pt's husband: [name]           Patient will benefit from skilled therapeutic intervention in order to improve the following  deficits and impairments:  Abnormal gait, Decreased endurance, Impaired sensation, Cardiopulmonary status limiting activity, Decreased knowledge of use of DME, Decreased activity tolerance, Decreased strength, Decreased balance, Decreased mobility, Dizziness, Impaired flexibility  Visit Diagnosis: Other abnormalities of gait and mobility  Muscle weakness (generalized)     Problem List Patient Active Problem List   Diagnosis Date Noted  . Hemiplegia and hemiparesis following cerebral infarction affecting left non-dominant side ([place]) 02/05/2020  . 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) 01/14/2020  . CVA (cerebral vascular accident) ([place]) 01/09/2020  . Dizziness 11/30/2018  . Atrial fibrillation ([place]) 08/13/2018  . Prediabetes 06/15/2018  . Palpitations 06/05/2018  . Essential hypertension 06/05/2018  . Hyperlipidemia LDL goal <70 06/05/2018  . Acute ischemic right MCA stroke ([place]) 06/05/2018  . Stroke (cerebrum) ([place]) - R MCA s/p tPA, embolic, source unknown 19/50/9326  . SVT (supraventricular tachycardia) ([place]) 05/28/2015  . PVC's (premature ventricular contractions) 05/28/2015  . BENIGN POSITIONAL VERTIGO 06/16/2009  . ALLERGIC RHINITIS 06/16/2009    Taurean Ju L 02/27/2020, 11:46 AM  [facility] [address] [place], [place], [address] Phone: [phone]   Fax:  [phone]  Name: Mary Mercado MRN: [id number] Date of Birth: October 16, 1940  Geoffry Paradise, PT,DPT 02/27/20  11:47 AM Phone: [phone] Fax: [phone]

## 2020-02-27 NOTE — Patient Instructions (Signed)
Ischemic Stroke  An ischemic stroke (cerebrovascular accident, or CVA) is the sudden death of brain tissue that occurs when an area of the brain does not get enough oxygen. It is a medical emergency that must be treated right away. An ischemic stroke can cause permanent loss of brain function. This can cause problems with how different parts of your body function. What are the causes? This condition is caused by a decrease of oxygen supply to an area of the brain, which may be the result of:  A small blood clot (embolus) or a buildup of plaque in the blood vessels (atherosclerosis) that blocks blood flow in the brain.  An abnormal heart rhythm (atrial fibrillation).  A blocked or damaged artery in the head or neck. Sometimes the cause of stroke is not known (cryptogenic). What increases the risk? Certain factors may make you more likely to develop this condition. Some of these factors are things that you can change, such as:  Obesity.  Smoking cigarettes.  Taking oral birth control, especially if you also use tobacco.  Physical inactivity.  Excessive alcohol use.  Use of illegal drugs, especially cocaine and methamphetamine. Other risk factors include:  High blood pressure (hypertension).  High cholesterol.  Diabetes mellitus.  Heart disease.  Being [place] American, Native American, Hispanic, or [place] Native.  Being over age 34.  Family history of stroke.  Previous history of blood clots, stroke, or transient ischemic attack (TIA).  Sickle cell disease.  Being a woman with a history of preeclampsia.  Migraine headache.  Sleep apnea.  Irregular heartbeats, such as atrial fibrillation.  Chronic inflammatory diseases, such as rheumatoid arthritis or lupus.  Blood clotting disorders (hypercoagulable state). What are the signs or symptoms? Symptoms of this condition usually develop suddenly, or you may notice them after waking up from sleep. Symptoms may include  sudden:  Weakness or numbness in your face, arm, or leg, especially on one side of your body.  Trouble walking or difficulty moving your arms or legs.  Loss of balance or coordination.  Confusion.  Slurred speech (dysarthria).  Trouble speaking, understanding speech, or both (aphasia).  Vision changes--such as double vision, blurred vision, or loss of vision--in one or both eyes.  Dizziness.  Nausea and vomiting.  Severe headache with no known cause. The headache is often described as the worst headache ever experienced. If possible, make note of the exact time that you last felt like your normal self and what time your symptoms started. Tell your health care provider. If symptoms come and go, this could be a sign of a warning stroke, or TIA. Get help right away, even if you feel better. How is this diagnosed? This condition may be diagnosed based on:  Your symptoms, your medical history, and a physical exam.  CT scan of the brain.  MRI.  CT angiogram. This test uses a computer to take X-rays of your arteries. A dye may be injected into your blood to show the inside of your blood vessels more clearly.  MRI angiogram. This is a type of MRI that is used to evaluate the blood vessels.  Cerebral angiogram. This test uses X-rays and a dye to show the blood vessels in the brain and neck. You may need to see a health care provider who specializes in stroke care. A stroke specialist can be seen in person or through communication using telephone or television technology (telemedicine). Other tests may also be done to find the cause of the stroke, such  as:  Electrocardiogram (ECG).  Continuous heart monitoring.  Echocardiogram.  Transesophageal echocardiogram (TEE).  Carotid ultrasound.  A scan of the brain circulation.  Blood tests.  Sleep study to check for sleep apnea. How is this treated? Treatment for this condition will depend on the duration, severity, and cause  of your symptoms and on the area of the brain affected. It is very important to get treatment at the first sign of stroke symptoms. Some treatments work better if they are done within 3-6 hours of the onset of stroke symptoms. These initial treatments may include:  Aspirin.  Medicines to control blood pressure.  Medicine given by injection to dissolve the blood clot (thrombolytic).  Treatments given directly to the affected artery to remove or dissolve the blood clot. Other treatment options may include:  Oxygen.  IV fluids.  Medicines to thin the blood (anticoagulants or antiplatelets).  Procedures to increase blood flow. Medicines and changes to your diet may be used to help treat and manage risk factors for stroke, such as diabetes, high cholesterol, and high blood pressure. After a stroke, you may work with physical, speech, mental health, or occupational therapists to help you recover. Follow these instructions at home: Medicines  Take over-the-counter and prescription medicines only as told by your health care provider.  If you were told to take a medicine to thin your blood, such as aspirin or an anticoagulant, take it exactly as told by your health care provider. ? Taking too much blood-thinning medicine can cause bleeding. ? If you do not take enough blood-thinning medicine, you will not have the protection that you need against another stroke and other problems.  Understand the side effects of taking anticoagulant medicine. When taking this type of medicine, make sure you: ? Hold pressure over any cuts for longer than usual. ? Tell your dentist and other health care providers that you are taking anticoagulants before you have any procedures that may cause bleeding. ? Avoid activities that may cause trauma or injury. Eating and drinking  Follow instructions from your health care provider about diet.  Eat healthy foods.  If your ability to swallow was affected by the  stroke, you may need to take steps to avoid choking, such as: ? Taking small bites when eating. ? Eating foods that are soft or pureed. Safety  Follow instructions from your health care team about physical activity.  Use a walker or cane as told by your health care provider.  Take steps to create a safe home environment in order to reduce the risk of falls. This may include: ? Having your home looked at by specialists. ? Installing grab bars in the bedroom and bathroom. ? Using safety equipment, such as raised toilets and a seat in the shower. General instructions  Do not use any tobacco products, such as cigarettes, chewing tobacco, and e-cigarettes. If you need help quitting, ask your health care provider.  Limit alcohol intake to no more than 1 drink a day for nonpregnant women and 2 drinks a day for men. One drink equals 12 oz of beer, 5 oz of wine, or 1 oz of hard liquor.  If you need help to stop using drugs or alcohol, ask your health care provider about a referral to a program or specialist.  Maintain an active and healthy lifestyle. Get regular exercise as told by your health care provider.  Keep all follow-up visits as told by your health care provider, including visits with all specialists on your  health care team. This is important. How is this prevented? Your risk of another stroke can be decreased by managing high blood pressure, high cholesterol, diabetes, heart disease, sleep apnea, and obesity. It can also be decreased by quitting smoking, limiting alcohol, and staying physically active. Your health care provider will continue to work with you on measures to prevent short-term and long-term complications of stroke. Get help right away if:   You have any symptoms of a stroke. "BE FAST" is an easy way to remember the main warning signs of a stroke: ? B - Balance. Signs are dizziness, sudden trouble walking, or loss of balance. ? E - Eyes. Signs are trouble seeing or a  sudden change in vision. ? F - Face. Signs are sudden weakness or numbness of the face, or the face or eyelid drooping on one side. ? A - Arms. Signs are weakness or numbness in an arm. This happens suddenly and usually on one side of the body. ? S - Speech. Signs are sudden trouble speaking, slurred speech, or trouble understanding what people say. ? T - Time. Time to call emergency services. Write down what time symptoms started.  You have other signs of a stroke, such as: ? A sudden, severe headache with no known cause. ? Nausea or vomiting. ? Seizure.  These symptoms may represent a serious problem that is an emergency. Do not wait to see if the symptoms will go away. Get medical help right away. Call your local emergency services (911 in the [place] Do not drive yourself to the hospital. Summary  An ischemic stroke (cerebrovascular accident, or CVA) is the sudden death of brain tissue that occurs when an area of the brain does not get enough oxygen.  Symptoms of this condition usually develop suddenly, or you may notice them after waking up from sleep.  It is very important to get treatment at the first sign of stroke symptoms. Stroke is a medical emergency that must be treated right away. This information is not intended to replace advice given to you by your health care provider. Make sure you discuss any questions you have with your health care provider. Document Revised: 12/29/2017 Document Reviewed: 07/08/2015 Elsevier Patient Education  [place].

## 2020-02-27 NOTE — Patient Instructions (Signed)
It was nice to meet you both today! I appreciate that you entrust me with your sleep related healthcare concerns. I hope, I was able to address at least some of your concerns today, and that I can help you feel reassured and also get better.    Here is what we discussed today and what we came up with as our plan for you:    Based on your symptoms and your exam I believe you are at risk for obstructive sleep apnea (aka OSA), and I think we should proceed with a sleep study to determine whether you do or do not have OSA and how severe it is. Even, if you have mild OSA, I may want you to consider treatment with CPAP, as treatment of even borderline or mild sleep apnea can result and improvement of symptoms such as sleep disruption, daytime sleepiness, nighttime bathroom breaks, restless leg symptoms, improvement of headache syndromes, even improved mood disorder.   As explained, an attended sleep study meaning you get to stay overnight in the sleep lab, lets [place] monitor sleep-related behaviors such as sleep talking and leg movements in sleep, in addition to monitoring for sleep apnea.  A home sleep test is a screening tool for sleep apnea only, and unfortunately does not help with any other sleep-related diagnoses.  Please remember, the long-term risks and ramifications of untreated moderate to severe obstructive sleep apnea are: increased Cardiovascular disease, including congestive heart failure, stroke, difficult to control hypertension, treatment resistant obesity, arrhythmias, especially irregular heartbeat commonly known as A. Fib. (atrial fibrillation); even type 2 diabetes has been linked to untreated OSA.   Sleep apnea can cause disruption of sleep and sleep deprivation in most cases, which, in turn, can cause recurrent headaches, problems with memory, mood, concentration, focus, and vigilance. Most people with untreated sleep apnea report excessive daytime sleepiness, which can affect their ability  to drive. Please do not drive if you feel sleepy. Patients with sleep apnea can also develop difficulty initiating and maintaining sleep (aka insomnia).   Having sleep apnea may increase your risk for other sleep disorders, including involuntary behaviors sleep such as sleep terrors, sleep talking, sleepwalking.    Having sleep apnea can also increase your risk for restless leg syndrome and leg movements at night.   Please note that untreated obstructive sleep apnea may carry additional perioperative morbidity. Patients with significant obstructive sleep apnea (typically, in the moderate to severe degree) should receive, if possible, perioperative PAP (positive airway pressure) therapy and the surgeons and particularly the anesthesiologists should be informed of the diagnosis and the severity of the sleep disordered breathing.   I will likely see you back after your sleep study to go over the test results and where to go from there. We will call you after your sleep study to advise about the results (most likely, you will hear from [facility], my nurse) and to set up an appointment at the time, as necessary.    Our sleep lab administrative assistant will call you to schedule your sleep study and give you further instructions, regarding the check in process for the sleep study, arrival time, what to bring, when you can expect to leave after the study, etc., and to answer any other logistical questions you may have. If you don't hear back from her by about 2 weeks from now, please feel free to call her direct line at [phone] or you can call our general clinic number, or email [place] through My Chart.

## 2020-02-27 NOTE — Progress Notes (Signed)
Subjective:    Patient ID: Mary Mercado is a 79 y.o. female.  HPI     Star Age, MD, PhD [facility] [address], [address] [place], [place] [address] Dear Mary Mercado and Mamie Nick,   I saw your patient, Mary Mercado, upon your kind request in my sleep clinic today for initial consultation of her sleep disorder, in particular, concern for obstructive sleep apnea.  The patient is accompanied by her husband today.  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 fib, with status post cardioversion twice, who reports snoring and some daytime somnolence.  She has been taking a nap on a nearly daily basis since her stroke in September.  She feels fatigued but also some degree of shortness of breath because of her A. fib.  Sometimes she just feels jittery.  She has been in outpatient therapy and is doing well.  She uses a rolling walker consistently in the house and outside the house since her stroke.  She has not fallen.  Her snoring is mild per husband.  She denies any recurrent morning headaches but does have nocturia about twice per average night.  Bedtime is around 10 PM and rise time around 7 or 8 AM.  She is not aware of any family history of sleep apnea, denies restless leg symptoms or leg twitching at night.  She often naps after lunch for up to 2 hours.  I reviewed your office note from 02/03/2020.  Her Epworth sleepiness score is 7/24, fatigue severity score is 38 out of 63.  Her Past Medical History Is Significant For: Past Medical History:  Diagnosis Date  . Allergy   . CVA (cerebral vascular accident) ([place]) 05/2018, 01/09/20  . Hyperlipidemia LDL goal <70   . Hypertension   . Palpitations   . Vertigo, benign positional     Her Past Surgical  History Is Significant For: Past Surgical History:  Procedure Laterality Date  . CARDIOVERSION Left 10/31/2018   Procedure: CARDIOVERSION;  Surgeon: Acie Fredrickson Wonda Cheng, MD;  Location: [facility];  Service: Cardiovascular;  Laterality: Left;  . CARDIOVERSION N/A 01/08/2020   Procedure: CARDIOVERSION;  Surgeon: [name], MD;  Location: [facility] ENDOSCOPY;  Service: Cardiovascular;  Laterality: N/A;  . Hemilaminectomy and microdiskectomy at L4-5 on the left.  12/10/2003  . TEE WITHOUT CARDIOVERSION N/A 06/05/2018   Procedure: TRANSESOPHAGEAL ECHOCARDIOGRAM (TEE);  Surgeon: Lelon Perla, MD;  Location: [facility] ENDOSCOPY;  Service: Cardiovascular;  Laterality: N/A;  loop    Her Family History Is Significant For: Family History  Problem Relation Age of Onset  . Colon cancer Father   . Diabetes Mother   . Heart attack Mother        15  . Heart attack Brother 37    Her Social History Is Significant For: Social History   Socioeconomic History  . Marital status: Married    Spouse name: [name]  . Number of children: Not on file  . Years of education: 12+  . Highest education level: Not on file  Occupational History    Comment: retired  Tobacco Use  . Smoking status: Never Smoker  . Smokeless tobacco: Never Used  Vaping Use  . Vaping Use: Never used  Substance and Sexual Activity  . Alcohol use: Never  . Drug use: Never  . Sexual  activity: Not on file  Other Topics Concern  . Not on file  Social History Narrative   Lives with husband   caffeine none now   Social Determinants of Health   Financial Resource Strain:   . Difficulty of Paying Living Expenses: Not on file  Food Insecurity:   . Worried About Charity fundraiser in the Last Year: Not on file  . Ran Out of Food in the Last Year: Not on file  Transportation Needs:   . Lack of Transportation (Medical): Not on file  . Lack of Transportation (Non-Medical): Not on file  Physical Activity:   . Days of Exercise per  Week: Not on file  . Minutes of Exercise per Session: Not on file  Stress:   . Feeling of Stress : Not on file  Social Connections:   . Frequency of Communication with Friends and Family: Not on file  . Frequency of Social Gatherings with Friends and Family: Not on file  . Attends Religious Services: Not on file  . Active Member of Clubs or Organizations: Not on file  . Attends Archivist Meetings: Not on file  . Marital Status: Not on file    Her Allergies Are:  Allergies  Allergen Reactions  . Duloxetine     Causes Bp elevation.  Increased body jerks, nausea  . Acetaminophen Hives and Rash  :   Her Current Medications Are:  Outpatient Encounter Medications as of 02/27/2020  Medication Sig  . atorvastatin (LIPITOR) 80 MG tablet TAKE 1 TABLET (80 MG TOTAL) BY MOUTH DAILY AT 6 PM.  . diltiazem (CARDIZEM CD) 120 MG 24 hr capsule Take 1 capsule (120 mg total) by mouth at bedtime.  Marland Kitchen diltiazem (CARDIZEM CD) 240 MG 24 hr capsule Take 1 capsule (240 mg total) by mouth daily.  Marland Kitchen diltiazem (CARDIZEM) 30 MG tablet Take 1 tablet (30 mg total) by mouth 4 (four) times daily as needed. Prn  . KLOR-CON M10 10 MEQ tablet TAKE 1 TABLET BY MOUTH EVERY DAY  . metoprolol tartrate (LOPRESSOR) 25 MG tablet Take 0.5 tablets (12.5 mg total) by mouth 2 (two) times daily.  Marland Kitchen PRADAXA 150 MG CAPS capsule TAKE 1 CAPSULE (150 MG TOTAL) BY MOUTH EVERY 12 (TWELVE) HOURS.   Facility-Administered Encounter Medications as of 02/27/2020  Medication  . regadenoson (LEXISCAN) injection SOLN 0.4 mg  . technetium tetrofosmin (TC-MYOVIEW) injection 32 millicurie  :  Review of Systems:  Out of a complete 14 point review of systems, all are reviewed and negative with the exception of these symptoms as listed below: Review of Systems  Neurological:       New patient here for sleep consult, with husband.  Epworth Sleepiness Scale 0= would never doze 1= slight chance of dozing 2= moderate chance of  dozing 3= high chance of dozing  Sitting and reading:1 Watching TV: 2 Sitting inactive in a public place (ex. Theater or meeting):0 As a passenger in a car for an hour without a break:0 Lying down to rest in the afternoon:3 Sitting and talking to someone:0 Sitting quietly after lunch (no alcohol):1 In a car, while stopped in traffic:0 Total:7    Objective:  Neurological Exam  Physical Exam Physical Examination:   Vitals:   02/27/20 1508  BP: 139/80  Pulse: 91    General Examination: The patient is a very pleasant 79 y.o. female in no acute distress. She appears well-developed and well-nourished and well groomed.   HEENT: Normocephalic, atraumatic, pupils are  equal, round and reactive to light, status post left cataract extraction, dense right cataract noted.  Extraocular tracking is preserved, face is symmetric with normal facial animation.  She does have mild intermittent dysarthria and word finding difficulty.  Airway examination reveals mild mouth dryness, small airway entry, redundant soft palate, tonsils on the smaller side, Mallampati class I, neck circumference of 12 three-quarter inches.  She has a moderate overbite.  Tongue protrudes centrally and palate elevates symmetrically.  No carotid bruits.  Chest: Clear to auscultation without wheezing, rhonchi or crackles noted.  Heart: S1+S2+0, heart rate in the 90s, irregularly irregular.  No murmurs noted.    Abdomen: Soft, non-tender and non-distended.  Extremities: There is 1+ pitting edema in the distal lower extremities bilaterally.   Skin: Warm and dry without trophic changes noted.   Musculoskeletal: exam reveals no obvious joint deformities, but slight difficulty with left hand finger extension of the last 3 digits.    Neurologically:  Mental status: The patient is awake, alert and oriented in all 4 spheres. Her immediate and remote memory, attention, language skills and fund of knowledge are appropriate. There  is no evidence of aphasia, agnosia, apraxia or anomia. Speech is clear with normal prosody and enunciation. Thought process is linear. Mood is normal and affect is normal.  Cranial nerves II - XII are as described above under HEENT exam.  Motor exam: Normal bulk, strength and tone is noted. There is no tremor, Romberg is not tested for safety concerns. Fine motor skills and coordination: Mildly impaired in the upper extremities. Cerebellar testing: No dysmetria or intention tremor. There is no truncal or gait ataxia.  Sensory exam: intact to light touch in the upper and lower extremities.  Gait, station and balance: She stands without major difficulty, she stands wide-based.  She has a rolling walker which she maneuvers well.  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 fib, with status post cardioversion twice, who presents for evaluation of her sleep disturbance, concern for underlying obstructive sleep apnea.  Given her history of persistent A. fib and stroke, it is reasonable to pursue a sleep study to rule out underlying obstructive sleep apnea.   I had a long chat with the patient and her husband about my findings and the diagnosis of OSA, its prognosis and treatment options. We talked about medical treatments, surgical interventions and non-pharmacological approaches. I explained in particular the risks and ramifications of untreated moderate to severe OSA, especially with respect to developing cardiovascular disease down the Road, including congestive heart failure, difficult to treat hypertension, cardiac arrhythmias, or stroke. Even type 2 diabetes has, in part, been linked to untreated OSA. Symptoms of untreated OSA include daytime sleepiness, memory  problems, mood irritability and mood disorder such as depression and anxiety, lack of energy, as well as recurrent headaches, especially morning headaches. We talked about trying to maintain a healthy lifestyle in general, as well as the importance of weight control. We also talked about the importance of good sleep hygiene. I recommended the following at this time: sleep study.  She is reluctant to consider an overnight laboratory attended sleep study as she believes that she would not be able to sleep.  She is encouraged to think about it.  I explained the difference between a laboratory attended sleep  study and a home sleep test.  If need be, we can pursue a home sleep test. I explained the sleep test procedure to the patient and also outlined possible surgical and non-surgical treatment options of OSA, including the use of a custom-made dental device (which would require a referral to a specialist dentist or oral surgeon), upper airway surgical options, such as traditional UPPP or a novel less invasive surgical option in the form of Inspire hypoglossal nerve stimulation (which would involve a referral to an ENT surgeon). I also explained the CPAP treatment option to the patient, who indicated that she would be willing to try CPAP if the need arises. I explained the importance of being compliant with PAP treatment, not only for insurance purposes but primarily to improve Her symptoms, and for the patient's long term health benefit, including to reduce Her cardiovascular risks. I answered all their questions today and the patient and her husband were in agreement. I plan to see her back after the sleep study is completed and encouraged them to call with any interim questions, concerns, problems or updates.   Thank you very much for allowing me to participate in the care of this nice patient. If I can be of any further assistance to you please do not hesitate to talk to me.  Sincerely,   Star Age, MD,  PhD

## 2020-03-02 ENCOUNTER — Other Ambulatory Visit: Payer: Self-pay

## 2020-03-02 ENCOUNTER — Encounter (HOSPITAL_COMMUNITY): Payer: Self-pay | Admitting: Nurse Practitioner

## 2020-03-02 ENCOUNTER — Ambulatory Visit (HOSPITAL_COMMUNITY)
Admission: RE | Admit: 2020-03-02 | Discharge: 2020-03-02 | Disposition: A | Discharge status: Home / Self Care | Payer: Medicare Other | Source: Ambulatory Visit | Attending: Nurse Practitioner | Admitting: Nurse Practitioner

## 2020-03-02 VITALS — BP 142/80 | HR 141 | Ht 65.0 in | Wt 137.4 lb

## 2020-03-02 DIAGNOSIS — I4819 Other persistent atrial fibrillation: Secondary | ICD-10-CM | POA: Diagnosis not present

## 2020-03-02 DIAGNOSIS — Z8673 Personal history of transient ischemic attack (TIA), and cerebral infarction without residual deficits: Secondary | ICD-10-CM | POA: Insufficient documentation

## 2020-03-02 DIAGNOSIS — I082 Rheumatic disorders of both aortic and tricuspid valves: Secondary | ICD-10-CM | POA: Insufficient documentation

## 2020-03-02 DIAGNOSIS — I4891 Unspecified atrial fibrillation: Secondary | ICD-10-CM | POA: Insufficient documentation

## 2020-03-02 DIAGNOSIS — D6869 Other thrombophilia: Secondary | ICD-10-CM

## 2020-03-02 NOTE — Progress Notes (Signed)
Primary Care Physician: [name], MD Referring Physician: Dr. Wendall Mola Motsinger Mary Mercado is a 79 y.o. 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 A 30 day monitor was placed and she was found to have afib. Eliquis was started for a CHA2DS2VASc score of 6. She had return to afib in July 2020 and a successful cardioversion was performed. She then was in her usual state of health until September 2021 when she presented to Dr. Acie Fredrickson with afib with RVR. She was set up for cardioversion, which was successful, stated no known missed eliquis doses. She was started on flecainide and had a stress test the next day. She developed stroke symptoms while at the [facility] street office and was sent directly to the [facility] ER as Code stroke.   She had f/u with Dr. Acie Fredrickson a few days ago and was in afib with RVR. He stopped flecainide. She was changed to Pradaxa after the last stroke.  She  was sent here to discuss options to return to SR. I do not believe she will an ablation candidate as she had a recent stroke and her rt and left  upper chambers are severely dilated. Her EKG showed v rate at 165 bpm. However, pulse ox worn during the visit, showed heart rates in the 100's. They are using 30 mg cardizem as needed to control v rates at home as well at her 240 mg daily. It appears that her best options to get back in rhythm would be amiodarone or tikosyn, but either way the pt and her husband are very leary re a repeat cardioversion. She would not be an ablation candidate for her recent stroke and enlarged atrium. She appears to be tolerating the afib ok.   F/u in afib clinic, 10/25/2, after increase of Cardizem for better rate control  as the pt and her husband did not want to pursue SR if that meant another cardioversion for fear of repeat stroke. Her v rate is 140 in afib today, but her readings at home show 93 to 116  bpm. Pulse ox during the visit showed HR's 80's to 120 bpm. She states that  she feels well. She  has not noted any adverse effect from  increase in Cardizem. Pt  and husband  still hesitant to consider any plans that may require another cardioversion. She is being compliant with her Pradaxa for CHA2DS2VASc score of 6.  I did mention that a TEE could be done with another cardioversion attempt if needed to minimize  her stroke risk and tikosyn itself may convert her with a cardioversion needed.  F/u in afib clinic 10/28. She has been started on metoprolol 12.5 mg bid in addition to Cardizem. Her HR by her husband's V/S checks look  Improved,  she feels well. Her BP on presentation here was 88/56, but recheck by me 120/60. Her BP's  at home look stable . HR's are trending down to 80's and 90's.   F/u in afib clinic, 11/15. She remains in reasonable rate controlled afib at home with stable BP. She has RVR in the office today.  However, the pt and her husband both state that her overall QOL seems to be suffering in afib. She  gets very tired in the afternoon and just has enough energy to sit on the couch for the rest of the evening. We discussed rhythm control again today.   Today, she denies symptoms of palpitations, chest pain, shortness of  breath, orthopnea, PND, lower extremity edema, dizziness, presyncope, syncope, or neurologic sequela. The patient is tolerating medications without difficulties and is otherwise without complaint today.   Past Medical History:  Diagnosis Date  . Allergy   . CVA (cerebral vascular accident) ([place]) 05/2018, 01/09/20  . Hyperlipidemia LDL goal <70   . Hypertension   . Palpitations   . Vertigo, benign positional    Past Surgical History:  Procedure Laterality Date  . CARDIOVERSION Left 10/31/2018   Procedure: CARDIOVERSION;  Surgeon: Acie Fredrickson Wonda Cheng, MD;  Location: [facility];  Service: Cardiovascular;  Laterality: Left;  . CARDIOVERSION N/A 01/08/2020   Procedure: CARDIOVERSION;  Surgeon: [name], MD;  Location: [facility] ENDOSCOPY;   Service: Cardiovascular;  Laterality: N/A;  . Hemilaminectomy and microdiskectomy at L4-5 on the left.  12/10/2003  . TEE WITHOUT CARDIOVERSION N/A 06/05/2018   Procedure: TRANSESOPHAGEAL ECHOCARDIOGRAM (TEE);  Surgeon: Lelon Perla, MD;  Location: [facility] ENDOSCOPY;  Service: Cardiovascular;  Laterality: N/A;  loop    Current Outpatient Medications  Medication Sig Dispense Refill  . atorvastatin (LIPITOR) 80 MG tablet TAKE 1 TABLET (80 MG TOTAL) BY MOUTH DAILY AT 6 PM. 30 tablet 0  . diltiazem (CARDIZEM CD) 120 MG 24 hr capsule Take 1 capsule (120 mg total) by mouth at bedtime. 30 capsule 3  . diltiazem (CARDIZEM CD) 240 MG 24 hr capsule Take 1 capsule (240 mg total) by mouth daily. 90 capsule 3  . diltiazem (CARDIZEM) 30 MG tablet Take 1 tablet (30 mg total) by mouth 4 (four) times daily as needed. Prn 120 tablet 11  . KLOR-CON M10 10 MEQ tablet TAKE 1 TABLET BY MOUTH EVERY DAY 30 tablet 0  . metoprolol tartrate (LOPRESSOR) 25 MG tablet Take 0.5 tablets (12.5 mg total) by mouth 2 (two) times daily. 90 tablet 3  . PRADAXA 150 MG CAPS capsule TAKE 1 CAPSULE (150 MG TOTAL) BY MOUTH EVERY 12 (TWELVE) HOURS. 60 capsule 0   No current facility-administered medications for this encounter.   Facility-Administered Medications Ordered in Other Encounters  Medication Dose Route Frequency Provider Last Rate Last Admin  . regadenoson (LEXISCAN) injection SOLN 0.4 mg  0.4 mg Intravenous Once Buford Dresser, MD      . technetium tetrofosmin (TC-MYOVIEW) injection 32 millicurie  32 millicurie Intravenous Once PRN Buford Dresser, MD        Allergies  Allergen Reactions  . Duloxetine     Causes Bp elevation.  Increased body jerks, nausea  . Acetaminophen Hives and Rash    Social History   Socioeconomic History  . Marital status: Married    Spouse name: Mary Mercado  . Number of children: Not on file  . Years of education: 12+  . Highest education level: Not on file  Occupational  History    Comment: retired  Tobacco Use  . Smoking status: Never Smoker  . Smokeless tobacco: Never Used  Vaping Use  . Vaping Use: Never used  Substance and Sexual Activity  . Alcohol use: Never  . Drug use: Never  . Sexual activity: Not on file  Other Topics Concern  . Not on file  Social History Narrative   Lives with husband   caffeine none now   Social Determinants of Health   Financial Resource Strain:   . Difficulty of Paying Living Expenses: Not on file  Food Insecurity:   . Worried About Charity fundraiser in the Last Year: Not on file  . Ran Out of Food in the  Last Year: Not on file  Transportation Needs:   . Lack of Transportation (Medical): Not on file  . Lack of Transportation (Non-Medical): Not on file  Physical Activity:   . Days of Exercise per Week: Not on file  . Minutes of Exercise per Session: Not on file  Stress:   . Feeling of Stress : Not on file  Social Connections:   . Frequency of Communication with Friends and Family: Not on file  . Frequency of Social Gatherings with Friends and Family: Not on file  . Attends Religious Services: Not on file  . Active Member of Clubs or Organizations: Not on file  . Attends Archivist Meetings: Not on file  . Marital Status: Not on file  Intimate Partner Violence:   . Fear of Current or Ex-Partner: Not on file  . Emotionally Abused: Not on file  . Physically Abused: Not on file  . Sexually Abused: Not on file    Family History  Problem Relation Age of Onset  . Colon cancer Father   . Diabetes Mother   . Heart attack Mother        75  . Heart attack Brother 71    ROS- All systems are reviewed and negative except as per the HPI above  Physical Exam: Vitals:   03/02/20 1428  BP: (!) 142/80  Pulse: (!) 141  Weight: 62.3 kg  Height: 5\' 5"  (1.651 m)   Wt Readings from Last 3 Encounters:  03/02/20 62.3 kg  02/27/20 62.1 kg  02/20/20 61.1 kg    Labs: Lab Results  Component Value  Date   NA 139 01/20/2020   K 4.1 01/20/2020   CL 103 01/20/2020   CO2 30 01/20/2020   GLUCOSE 119 (H) 01/20/2020   BUN 9 01/20/2020   CREATININE 0.63 01/20/2020   CALCIUM 9.4 01/20/2020   MG 1.8 01/14/2020   Lab Results  Component Value Date   INR 1.3 (H) 01/09/2020   Lab Results  Component Value Date   CHOL 183 01/10/2020   HDL 92 01/10/2020   LDLCALC 81 01/10/2020   TRIG 52 01/10/2020     GEN- The patient is well appearing, alert and oriented x 3 today.   Head- normocephalic, atraumatic Eyes-  Sclera clear, conjunctiva pink Ears- hearing intact Oropharynx- clear Neck- supple, no JVP Lymph- no cervical lymphadenopathy Lungs- Clear to ausculation bilaterally, normal work of breathing Hear irregular rate and rhythm, no murmurs, rubs or gallops, PMI not laterally displaced GI- soft, NT, ND, + BS Extremities- no clubbing, cyanosis, or edema MS- no significant deformity or atrophy Skin- no rash or lesion Psych- euthymic mood, full affect Neuro- strength and sensation are intact  EKG-afib at 112 bpm , qrs int 76 bpm, qtc 421 ms  (HR's  at home 80's to 90's) Echo-1. The left ventricle has normal systolic function of 32-99%. The cavity  size was normal. There is no increased left ventricular wall thickness.  Echo evidence of pseudonormalization in diastolic relaxation and  indeterminate left ventricular filling  pressure.  2. Grade 2 diastolic dysfunction.  3. The right ventricle has normal systolic function. The cavity was  normal. There is no increase in right ventricular wall thickness.  4. Left atrial size was severely dilated.  5. Right atrial size was severely dilated.  6. The mitral valve is normal in structure.  7. The tricuspid valve is normal in structure. Tricuspid valve  regurgitation is moderate.  8. The aortic valve is normal  in structure. There is moderate thickening  and moderate calcification of the aortic valve.  9. The pulmonic valve was  normal in structure.      Assessment and Plan: 1. 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 Rayann Heman  She will not be a candidate for ablation for her recent stroke and severly dilated atrium The pt and her husband are very fearful of another cardioversion which will likely be needed  if we try to restore SR I talked to them at length re options of amiodarone and tikosyn, risk and benefit of both They had chosen to try rate control which she is reasonably rate controlled at home but her QOL is not what they would wish for in afib    I discussed amiodarone vrs tikosyn again and they are leaning  more toward rhythm control as they had wanted to try to avoid another cardioversion as her stroke was right after her last cardioversion  She appears to be more interested in Tikosyn instead of amiodarone for possible SE profile  BP's at home and on recheck here look stable but not well rate controlled today I agave them the info sheet on elective admission for tikosyn They will think it over at home and will discuss at f/u visit in 2 weeks, sooner itf they decide that is the way they want to go  A TEE can be added prior to DCCV if needed to lessen stroke concern   2. CHA2DS2VASc score of at least 6 Continue  pradaxa 150 mg bid   Follow up in 2 weeks   Butch Penny C. Ajaya Crutchfield, [facility] [address] [place], [place] [address] [phone]

## 2020-03-03 ENCOUNTER — Encounter: Payer: Self-pay | Admitting: Physical Therapy

## 2020-03-03 ENCOUNTER — Ambulatory Visit: Payer: Medicare Other | Admitting: Physical Therapy

## 2020-03-03 VITALS — BP 110/72

## 2020-03-03 DIAGNOSIS — R2689 Other abnormalities of gait and mobility: Secondary | ICD-10-CM | POA: Diagnosis not present

## 2020-03-03 DIAGNOSIS — R278 Other lack of coordination: Secondary | ICD-10-CM | POA: Diagnosis not present

## 2020-03-03 DIAGNOSIS — M6281 Muscle weakness (generalized): Secondary | ICD-10-CM | POA: Diagnosis not present

## 2020-03-03 DIAGNOSIS — R41842 Visuospatial deficit: Secondary | ICD-10-CM | POA: Diagnosis not present

## 2020-03-03 DIAGNOSIS — R2681 Unsteadiness on feet: Secondary | ICD-10-CM | POA: Diagnosis not present

## 2020-03-03 DIAGNOSIS — R41844 Frontal lobe and executive function deficit: Secondary | ICD-10-CM | POA: Diagnosis not present

## 2020-03-04 NOTE — Therapy (Signed)
[facility] [address] [place], [place], [address] Phone: [phone]   Fax:  [phone]  Physical Therapy Treatment  Patient Details  Name: Mary Mercado MRN: [id number] Date of Birth: 1940-08-27 Referring Provider (PT): [name], [facility]   Encounter Date: 03/03/2020   PT End of Session - 03/03/20 1325    Visit Number 5    Number of Visits 17    Date for PT Re-Evaluation 05/13/20    Authorization Type Medicare (PN every 10th visit) and BCBS supplemental    PT Start Time 1318    PT Stop Time 1400    PT Time Calculation (min) 42 min    Equipment Utilized During Treatment Gait belt    Activity Tolerance Patient tolerated treatment well;Patient limited by fatigue    Behavior During Therapy [facility] for tasks assessed/performed           Past Medical History:  Diagnosis Date  . Allergy   . CVA (cerebral vascular accident) ([place]) 05/2018, 01/09/20  . Hyperlipidemia LDL goal <70   . Hypertension   . Palpitations   . Vertigo, benign positional     Past Surgical History:  Procedure Laterality Date  . CARDIOVERSION Left 10/31/2018   Procedure: CARDIOVERSION;  Surgeon: Acie Fredrickson Wonda Cheng, MD;  Location: [place];  Service: Cardiovascular;  Laterality: Left;  . CARDIOVERSION N/A 01/08/2020   Procedure: CARDIOVERSION;  Surgeon: [name], MD;  Location: [facility] ENDOSCOPY;  Service: Cardiovascular;  Laterality: N/A;  . Hemilaminectomy and microdiskectomy at L4-5 on the left.  12/10/2003  . TEE WITHOUT CARDIOVERSION N/A 06/05/2018   Procedure: TRANSESOPHAGEAL ECHOCARDIOGRAM (TEE);  Surgeon: Lelon Perla, MD;  Location: [facility] ENDOSCOPY;  Service: Cardiovascular;  Laterality: N/A;  loop    Vitals:   03/03/20 1323 03/03/20 1338 03/03/20 1359  BP: (!) 88/58 104/60 110/72     Subjective Assessment - 03/03/20 1323    Subjective Went to Afib clinic yesterday, her HR was up to 140bpm. Looking to try Tycin as  an inpatient, with possible Cardioversion if that does not work. MD has mentioned possible pacemaker as well. Has a sleep study next week as well. No falls. OTAGO HEP is going well. No pain.    Patient is accompained by: Family member    Pertinent History CAD, hx of CVA, PAF s/p DCCV, BPPV, a-fib (currently undergoing testing to stop a-fib but was told not a candidate for ablation and might have to live with a-fib), HLD    Limitations Standing;Walking    How long can you stand comfortably? 5 minutes    How long can you walk comfortably? 10-15 minutes    Patient Stated Goals To be able to feel good when I walk, improve endurance    Currently in Pain? No/denies    Pain Score 0-No pain                [facility] Adult PT Treatment/Exercise - 03/03/20 1339      Transfers   Transfers Sit to Stand;Stand to Sit    Sit to Stand 5: Supervision    Stand to Sit 5: Supervision      Ambulation/Gait   Ambulation/Gait Yes    Ambulation/Gait Assistance 4: Min guard    Ambulation/Gait Assistance Details cues needed for sequencing with cane, posture and cane placement.     Ambulation Distance (Feet) 230 Feet   x1, plus around gym with session   Assistive device Straight cane   with rubber quad tip  Gait Pattern Step-through pattern;Decreased stride length    Ambulation Surface Level;Indoor               Balance Exercises - 03/03/20 1340      Balance Exercises: Standing   Standing Eyes Closed Narrow base of support (BOS);Wide (BOA);Head turns;Foam/compliant surface;Other reps (comment);30 secs;Limitations    Standing Eyes Closed Limitations on airex with no UE support: feet together for EC 30 seconds x 3 reps, then with feet hip width apart for EC head movements left<>right, up<>down with min guard to min assist needed for balance.     Partial Tandem Stance Eyes closed;Foam/compliant surface;2 reps;30 secs;Limitations    Partial Tandem Stance Limitations on airex with no UE support for 2 reps  each foot forward with up to min assist for balance.                PT Short Term Goals - 02/20/20 1350      PT SHORT TERM GOAL #1   Title Pt will be independent with HEP for improved strength, balance, dizziness, and gait.  TARGET DATE FOR ALL STGS: 03/12/20    Time 4    Period Weeks    Status New      PT SHORT TERM GOAL #2   Title Perform BERG and DGI writes goals as indicated.    Time 4    Period Weeks    Status Achieved      PT SHORT TERM GOAL #3   Title Pt will improved gait speed to >/=2.70ft/sec. with LRAD to improve gait efficiency.    Baseline 2.63ft/sec with rollator    Time 4    Period Weeks    Status New      PT SHORT TERM GOAL #4   Title Pt will perform TUG with LRAD in </=13.5 sec. to decr. falls risk.    Baseline 18.16sec. with rollator    Time 4    Period Weeks    Status New      PT SHORT TERM GOAL #5   Title Pt will amb. 300' at MOD I level with LRAD over even terrain to improve functional mobility.    Time 4    Period Weeks    Status New      Additional Short Term Goals   Additional Short Term Goals Yes      PT SHORT TERM GOAL #6   Title Pt will improve BERG score to >/=48/56 to decr. falls risk.    Baseline 44/56    Time 4    Period Weeks    Status New      PT SHORT TERM GOAL #7   Title Pt will improve DGI score to >/=14/24 to decr. falls risk.    Baseline 11/24    Time 4    Period Weeks    Status New             PT Long Term Goals - 02/20/20 1351      PT LONG TERM GOAL #1   Title Pt will verbalize CVA s/s to decr. risk of additional CVA. TARGET DATE FOR ALL LTGS: 04/09/20    Time 8    Period Weeks    Status New      PT LONG TERM GOAL #2   Title Pt will improve gait with LRAD to >/=2.67ft/sec. to safely amb. in the community.    Baseline 2.44ft/sec with rollator    Time 8    Period Weeks    Status  New      PT LONG TERM GOAL #3   Title Pt will amb. 600' with LRAD at MOD I level oven even and uneven terrain to improve  functional mobility.    Time 8    Period Weeks    Status New      PT LONG TERM GOAL #4   Title Perform vestibular assessment as indicated and write goals as needed.    Time 8    Period Weeks    Status New      PT LONG TERM GOAL #5   Title Pt will improve BERG score to >/=52/56 to decr. falls risk.    Baseline 44/56    Time 8    Period Weeks    Status New      Additional Long Term Goals   Additional Long Term Goals Yes      PT LONG TERM GOAL #6   Title Pt will improve DGI score to >/=16/24 to decr. falls risk.    Baseline 11/24    Time 8    Period Weeks    Status New                 Plan - 03/03/20 1326    Clinical Impression Statement Today's skilled session continued to focus on gait with cane and balance on compliant surfaces. No issues reported or noted in session. The pt is progressing toward goals and should benefit from continued PT to progress toward goals.    Personal Factors and Comorbidities Age;Comorbidity 3+    Comorbidities CAD, hx of CVA, PAF s/p DCCV, BPPV, a-fib (currently undergoing testing to stop a-fib but was told not a candidate for ablation and might have to live with a-fib), HLD    Examination-Activity Limitations Bed Mobility;Bend;Bathing;Carry;Stand;Squat;Stairs;Toileting;Dressing;Transfers;Locomotion Level    Examination-Participation Restrictions Church;Yard Work;Driving;Community Activity;Laundry;Meal Prep;Cleaning    Stability/Clinical Decision Making Evolving/Moderate complexity    Rehab Potential Good    PT Frequency 2x / week    PT Duration 8 weeks    PT Treatment/Interventions ADLs/Self Care Home Management;Aquatic Therapy;Biofeedback;Canalith Repostioning;Balance training;Therapeutic exercise;Manual techniques;Vestibular;Therapeutic activities;Functional mobility training;Stair training;Gait training;Orthotic Fit/Training;Patient/family education;DME Instruction;Neuromuscular re-education    PT Next Visit Plan Monitor BP and signs for  orthostatic hypotension.  Pt has been started on OTAGO. Gait training with [place] with quad attachment (as that's what pt has at home). Balance activities.    Consulted and Agree with Plan of Care Patient;Family member/caregiver    Family Member Consulted pt's husband: [name]           Patient will benefit from skilled therapeutic intervention in order to improve the following deficits and impairments:  Abnormal gait, Decreased endurance, Impaired sensation, Cardiopulmonary status limiting activity, Decreased knowledge of use of DME, Decreased activity tolerance, Decreased strength, Decreased balance, Decreased mobility, Dizziness, Impaired flexibility  Visit Diagnosis: Other abnormalities of gait and mobility  Muscle weakness (generalized)  Unsteadiness on feet     Problem List Patient Active Problem List   Diagnosis Date Noted  . Hemiplegia and hemiparesis following cerebral infarction affecting left non-dominant side ([place]) 02/05/2020  . 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 CVA (cerebral vascular accident) ([place]) 01/09/2020  . Dizziness 11/30/2018  . Atrial fibrillation ([place]) 08/13/2018  . Prediabetes 06/15/2018  . Palpitations 06/05/2018  . Essential hypertension 06/05/2018  . Hyperlipidemia LDL goal <70 06/05/2018  . Acute ischemic right MCA stroke ([place]) 06/05/2018  . Stroke (cerebrum) ([place]) - R MCA s/p tPA, embolic, source unknown  05/31/2018  . SVT (supraventricular tachycardia) ([place]) 05/28/2015  . PVC's (premature ventricular contractions) 05/28/2015  . BENIGN POSITIONAL VERTIGO 06/16/2009  . ALLERGIC RHINITIS 06/16/2009   Willow Ora, PTA, [facility] [address], [place], [place] [address] [phone] 03/04/20, 7:34 PM   Name: Braeleigh Pyper MRN: [id number] Date of Birth: April 17, 1941

## 2020-03-05 ENCOUNTER — Ambulatory Visit: Payer: Medicare Other

## 2020-03-05 ENCOUNTER — Other Ambulatory Visit: Payer: Self-pay

## 2020-03-05 VITALS — BP 111/78 | HR 75

## 2020-03-05 DIAGNOSIS — M6281 Muscle weakness (generalized): Secondary | ICD-10-CM | POA: Diagnosis not present

## 2020-03-05 DIAGNOSIS — R41844 Frontal lobe and executive function deficit: Secondary | ICD-10-CM | POA: Diagnosis not present

## 2020-03-05 DIAGNOSIS — R2689 Other abnormalities of gait and mobility: Secondary | ICD-10-CM | POA: Diagnosis not present

## 2020-03-05 DIAGNOSIS — R278 Other lack of coordination: Secondary | ICD-10-CM | POA: Diagnosis not present

## 2020-03-05 DIAGNOSIS — R41842 Visuospatial deficit: Secondary | ICD-10-CM | POA: Diagnosis not present

## 2020-03-05 DIAGNOSIS — R2681 Unsteadiness on feet: Secondary | ICD-10-CM

## 2020-03-05 NOTE — Therapy (Signed)
[facility] [address] [place], [place], [address] Phone: [phone]   Fax:  [phone]  Physical Therapy Treatment  Patient Details  Name: Mary Mercado MRN: [id number] Date of Birth: [birth year] Referring Provider (PT): [name], [facility]   Encounter Date: 03/05/2020   PT End of Session - 03/05/20 1149    Visit Number 6    Number of Visits 17    Date for PT Re-Evaluation 05/13/20    Authorization Type Medicare (PN every 10th visit) and BCBS supplemental    PT Start Time 1101    PT Stop Time 1142    PT Time Calculation (min) 41 min    Equipment Utilized During Treatment Other (comment)   min guard to S prn   Activity Tolerance Patient tolerated treatment well;Patient limited by fatigue    Behavior During Therapy [facility] for tasks assessed/performed           Past Medical History:  Diagnosis Date  . Allergy   . CVA (cerebral vascular accident) ([place]) 05/2018, 01/09/20  . Hyperlipidemia LDL goal <70   . Hypertension   . Palpitations   . Vertigo, benign positional     Past Surgical History:  Procedure Laterality Date  . CARDIOVERSION Left 10/31/2018   Procedure: CARDIOVERSION;  Surgeon: Acie Fredrickson Wonda Cheng, MD;  Location: [place];  Service: Cardiovascular;  Laterality: Left;  . CARDIOVERSION N/A 01/08/2020   Procedure: CARDIOVERSION;  Surgeon: [name], MD;  Location: [facility] ENDOSCOPY;  Service: Cardiovascular;  Laterality: N/A;  . Hemilaminectomy and microdiskectomy at L4-5 on the left.  12/10/2003  . TEE WITHOUT CARDIOVERSION N/A 06/05/2018   Procedure: TRANSESOPHAGEAL ECHOCARDIOGRAM (TEE);  Surgeon: Lelon Perla, MD;  Location: [facility] ENDOSCOPY;  Service: Cardiovascular;  Laterality: N/A;  loop    Vitals:   03/05/20 1106 03/05/20 1111  BP: 111/78   Pulse: (!) 122 75  SpO2:  97%     Subjective Assessment - 03/05/20 1104    Subjective Pt reported next cardiology appt is 11/22 and MD  might start new medication for a-fib. Pt has sleep study at [facility] on 11/18. Pt denied falls since last visit.    Patient is accompained by: Family member   [name]-husband   Pertinent History CAD, hx of CVA, PAF s/p DCCV, BPPV, a-fib (currently undergoing testing to stop a-fib but was told not a candidate for ablation and might have to live with a-fib), HLD    Patient Stated Goals To be able to feel good when I walk, improve endurance    Currently in Pain? No/denies                             [facility] Adult PT Treatment/Exercise - 03/05/20 1112      Ambulation/Gait   Ambulation/Gait Yes    Ambulation/Gait Assistance 4: Min guard    Ambulation/Gait Assistance Details min guard for safety. cues to improve reciprocal arm swing, stride length and heel strike. Pt reported SOB and required seated rest breaks after each bout of amb.    Ambulation Distance (Feet) 130 Feet   x3 without AD, 250' c rollator, 130'x2 with [facility]   Assistive device None;Rollator;Straight cane   SPC with quad attachment   Gait Pattern Step-through pattern;Decreased stride length;Decreased arm swing - right;Decreased arm swing - left    Ambulation Surface Indoor;Level    Gait Comments BP after amb: 111/83 and HR: 78. At 1133 BP: 105/87 and  HR: 83bpm.                  PT Education - 03/05/20 1149    Education Details PT educated pt on the importance of taking vitals at home 2/2 a-fib.    Person(s) Educated Patient;Spouse    Methods Explanation    Comprehension Verbalized understanding            PT Short Term Goals - 02/20/20 1350      PT SHORT TERM GOAL #1   Title Pt will be independent with HEP for improved strength, balance, dizziness, and gait.  TARGET DATE FOR ALL STGS: 03/12/20    Time 4    Period Weeks    Status New      PT SHORT TERM GOAL #2   Title Perform BERG and DGI writes goals as indicated.    Time 4    Period Weeks    Status Achieved      PT SHORT TERM GOAL #3   Title Pt  will improved gait speed to >/=2.30ft/sec. with LRAD to improve gait efficiency.    Baseline 2.75ft/sec with rollator    Time 4    Period Weeks    Status New      PT SHORT TERM GOAL #4   Title Pt will perform TUG with LRAD in </=13.5 sec. to decr. falls risk.    Baseline 18.16sec. with rollator    Time 4    Period Weeks    Status New      PT SHORT TERM GOAL #5   Title Pt will amb. 300' at MOD I level with LRAD over even terrain to improve functional mobility.    Time 4    Period Weeks    Status New      Additional Short Term Goals   Additional Short Term Goals Yes      PT SHORT TERM GOAL #6   Title Pt will improve BERG score to >/=48/56 to decr. falls risk.    Baseline 44/56    Time 4    Period Weeks    Status New      PT SHORT TERM GOAL #7   Title Pt will improve DGI score to >/=14/24 to decr. falls risk.    Baseline 11/24    Time 4    Period Weeks    Status New             PT Long Term Goals - 02/20/20 1351      PT LONG TERM GOAL #1   Title Pt will verbalize CVA s/s to decr. risk of additional CVA. TARGET DATE FOR ALL LTGS: 04/09/20    Time 8    Period Weeks    Status New      PT LONG TERM GOAL #2   Title Pt will improve gait with LRAD to >/=2.33ft/sec. to safely amb. in the community.    Baseline 2.59ft/sec with rollator    Time 8    Period Weeks    Status New      PT LONG TERM GOAL #3   Title Pt will amb. 600' with LRAD at MOD I level oven even and uneven terrain to improve functional mobility.    Time 8    Period Weeks    Status New      PT LONG TERM GOAL #4   Title Perform vestibular assessment as indicated and write goals as needed.    Time 8    Period Weeks  Status New      PT LONG TERM GOAL #5   Title Pt will improve BERG score to >/=52/56 to decr. falls risk.    Baseline 44/56    Time 8    Period Weeks    Status New      Additional Long Term Goals   Additional Long Term Goals Yes      PT LONG TERM GOAL #6   Title Pt will  improve DGI score to >/=16/24 to decr. falls risk.    Baseline 11/24    Time 8    Period Weeks    Status New                 Plan - 03/05/20 1150    Clinical Impression Statement Pt's demonstrated progress as she was able to amb. without AD today and required less cues for sequencing with SPC/quad attachment. Pt required rest breaks 2/2 fatigue and pt's fluctuating HR, pt currently seeing cardiology and awaiting further testing to address a-fib. Pt would continue to benefit from skilled PT to improve safety during functional mobility.    Personal Factors and Comorbidities Age;Comorbidity 3+    Comorbidities CAD, hx of CVA, PAF s/p DCCV, BPPV, a-fib (currently undergoing testing to stop a-fib but was told not a candidate for ablation and might have to live with a-fib), HLD    Examination-Activity Limitations Bed Mobility;Bend;Bathing;Carry;Stand;Squat;Stairs;Toileting;Dressing;Transfers;Locomotion Level    Examination-Participation Restrictions Church;Yard Work;Driving;Community Activity;Laundry;Meal Prep;Cleaning    Stability/Clinical Decision Making Evolving/Moderate complexity    Rehab Potential Good    PT Frequency 2x / week    PT Duration 8 weeks    PT Treatment/Interventions ADLs/Self Care Home Management;Aquatic Therapy;Biofeedback;Canalith Repostioning;Balance training;Therapeutic exercise;Manual techniques;Vestibular;Therapeutic activities;Functional mobility training;Stair training;Gait training;Orthotic Fit/Training;Patient/family education;DME Instruction;Neuromuscular re-education    PT Next Visit Plan Check STGs. Monitor BP and signs for a-fib and  orthostatic hypotension. Gait training with SPC with quad attachment (as that's what pt has at home) and without AD. Balance activities.    Consulted and Agree with Plan of Care Patient;Family member/caregiver    Family Member Consulted pt's husband: [name]           Patient will benefit from skilled therapeutic intervention  in order to improve the following deficits and impairments:  Abnormal gait, Decreased endurance, Impaired sensation, Cardiopulmonary status limiting activity, Decreased knowledge of use of DME, Decreased activity tolerance, Decreased strength, Decreased balance, Decreased mobility, Dizziness, Impaired flexibility  Visit Diagnosis: Other abnormalities of gait and mobility  Unsteadiness on feet  Muscle weakness (generalized)     Problem List Patient Active Problem List   Diagnosis Date Noted  . Hemiplegia and hemiparesis following cerebral infarction affecting left non-dominant side ([place]) 02/05/2020  . 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 CVA (cerebral vascular accident) ([place]) 01/09/2020  . Dizziness 11/30/2018  . Atrial fibrillation ([place]) 08/13/2018  . Prediabetes 06/15/2018  . Palpitations 06/05/2018  . Essential hypertension 06/05/2018  . Hyperlipidemia LDL goal <70 06/05/2018  . Acute ischemic right MCA stroke ([place]) 06/05/2018  . Stroke (cerebrum) ([place]) - R MCA s/p tPA, embolic, source unknown 63/87/5643  . SVT (supraventricular tachycardia) ([place]) 05/28/2015  . PVC's (premature ventricular contractions) 05/28/2015  . BENIGN POSITIONAL VERTIGO 06/16/2009  . ALLERGIC RHINITIS 06/16/2009    Chaston Bradburn L 03/05/2020, 11:52 AM  [facility] [address] [place], [place], [address] Phone: [phone]   Fax:  [phone]  Name: Joselinne Lawal MRN: [id number] Date of  Birth: 04-03-1941  Geoffry Paradise, PT,DPT 03/05/20 11:53 AM Phone: [phone] Fax: [phone]

## 2020-03-06 ENCOUNTER — Encounter: Payer: Self-pay | Admitting: Occupational Therapy

## 2020-03-06 ENCOUNTER — Ambulatory Visit: Payer: Medicare Other | Admitting: Occupational Therapy

## 2020-03-06 ENCOUNTER — Ambulatory Visit: Payer: Medicare Other

## 2020-03-06 DIAGNOSIS — R41840 Attention and concentration deficit: Secondary | ICD-10-CM

## 2020-03-06 DIAGNOSIS — M6281 Muscle weakness (generalized): Secondary | ICD-10-CM

## 2020-03-06 DIAGNOSIS — R278 Other lack of coordination: Secondary | ICD-10-CM

## 2020-03-06 DIAGNOSIS — R41844 Frontal lobe and executive function deficit: Secondary | ICD-10-CM

## 2020-03-06 DIAGNOSIS — R2689 Other abnormalities of gait and mobility: Secondary | ICD-10-CM | POA: Diagnosis not present

## 2020-03-06 DIAGNOSIS — R41842 Visuospatial deficit: Secondary | ICD-10-CM

## 2020-03-06 DIAGNOSIS — R2681 Unsteadiness on feet: Secondary | ICD-10-CM | POA: Diagnosis not present

## 2020-03-06 DIAGNOSIS — R471 Dysarthria and anarthria: Secondary | ICD-10-CM

## 2020-03-06 NOTE — Therapy (Signed)
[facility] [address] [place], [place], [address] Phone: [phone]   Fax:  [phone]  Speech Language Pathology Treatment  Patient Details  Name: Mary Mercado MRN: [id number] Date of Birth: [birth year] Referring Provider (SLP): [name], MD   Encounter Date: 03/06/2020   End of Session - 03/06/20 1643    Visit Number 2    Number of Visits 9    Date for SLP Re-Evaluation 04/17/20    SLP Start Time 1025    SLP Stop Time  1445    SLP Time Calculation (min) 40 min    Activity Tolerance Patient tolerated treatment well           Past Medical History:  Diagnosis Date  . Allergy   . CVA (cerebral vascular accident) ([place]) 05/2018, 01/09/20  . Hyperlipidemia LDL goal <70   . Hypertension   . Palpitations   . Vertigo, benign positional     Past Surgical History:  Procedure Laterality Date  . CARDIOVERSION Left 10/31/2018   Procedure: CARDIOVERSION;  Surgeon: Acie Fredrickson Wonda Cheng, MD;  Location: [facility];  Service: Cardiovascular;  Laterality: Left;  . CARDIOVERSION N/A 01/08/2020   Procedure: CARDIOVERSION;  Surgeon: [name], MD;  Location: [facility] ENDOSCOPY;  Service: Cardiovascular;  Laterality: N/A;  . Hemilaminectomy and microdiskectomy at L4-5 on the left.  12/10/2003  . TEE WITHOUT CARDIOVERSION N/A 06/05/2018   Procedure: TRANSESOPHAGEAL ECHOCARDIOGRAM (TEE);  Surgeon: Lelon Perla, MD;  Location: [facility] ENDOSCOPY;  Service: Cardiovascular;  Laterality: N/A;  loop    There were no vitals filed for this visit.   Subjective Assessment - 03/06/20 1408    Subjective "I read some chapters out of the bile." Speech QOL score =47.    Patient is accompained by: Family member   daughter   Currently in Pain? No/denies                 ADULT SLP TREATMENT - 03/06/20 1408      General Information   Behavior/Cognition Alert;Cooperative;Pleasant mood      Treatment Provided    Treatment provided Cognitive-Linquistic      Cognitive-Linquistic Treatment   Treatment focused on Dysarthria;Patient/family/caregiver education    Skilled Treatment Speech QOL score= 47. SLP educated pt on some energy conservation measures she can take to maintain intelligibilty further into the evenings. Pt performed her "tongue twisters" with fast rate 15-20% of the time. SLP cued pt to ensure she was producing all reps with overarticulation and a speed that fostered speech clarity. Daughter states pt will catch herself when she speaks too fast and will stop and repeat the message. SLP encouraged pt that the compensation of repeating is one strategy we have to teach come patients but she is completing this naturally. SLP timed 90 seconds x5, with pt producing one episode of reduced speech intelligiblity when talking about husband's after-reception plans on their wedding day - high emotion. SLP told pt that with topics of incr'd emotion she may need to pay close attention to decr'd intelligibility. SLP encouraged Mary Mercado to wear an extermal cue for overarticuation, opening mouth more when talking.  Told pt today she will likely not talk with 100% intelligibilty 100% of the time but our goal is to get as close to that as we can.      Assessment / Recommendations / Plan   Plan Continue with current plan of care   suspect pt d/c in ~2 weeks     Progression  Toward Goals   Progression toward goals Progressing toward goals            SLP Education - 03/06/20 1643    Education Details do "tongue twisters" to habitualize slower more articulate speech not get them done ASAP once you start    Person(s) Educated Patient;Child(ren)    Methods Explanation;Demonstration;Verbal cues    Comprehension Verbalized understanding;Returned demonstration;Verbal cues required;Need further instruction              SLP Long Term Goals - 03/06/20 1645      SLP LONG TERM GOAL #1   Title pt will maintain  intelligibility of 95%+ in 15 minutes mod complex conversation over 2 sessions    Time 6    Period Weeks    Status On-going      SLP LONG TERM GOAL #2   Title pt will improve her speech-related QOL score by 8 points from date of eval (questionairre to be returned by pt on 1st therapy visit)    Time 6    Period Weeks    Status On-going            Plan - 03/06/20 1644    Clinical Impression Statement Pt presents today with mild dysarthria caused by min decr'd coordination of lingual and labial musculature. Mary Mercado remains ~100% intelligible today with SLP in 9 minutes mod complex conversation. SLP reminded pt to take rest earlier in the day if she desires, to conserve energy and to use external cues for compensation use. Pt would benefit from a short course of skilled ST to target procedure with her HEP provided today (see pt instructions) and her use of compensatory strategies of overarticulate and slow rate.    Speech Therapy Frequency 1x /week    Duration 8 weeks    Treatment/Interventions Other (comment);Functional tasks;Compensatory techniques;SLP instruction and feedback;Patient/family education;Internal/external aids   HEP for articulation   Potential to Achieve Goals Good    SLP Home Exercise Plan provided    Consulted and Agree with Plan of Care Patient           Patient will benefit from skilled therapeutic intervention in order to improve the following deficits and impairments:   Dysarthria and anarthria    Problem List Patient Active Problem List   Diagnosis Date Noted  . Hemiplegia and hemiparesis following cerebral infarction affecting left non-dominant side ([place]) 02/05/2020  . 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 CVA (cerebral vascular accident) ([place]) 01/09/2020  . Dizziness 11/30/2018  . Atrial fibrillation ([place]) 08/13/2018  . Prediabetes 06/15/2018  . Palpitations 06/05/2018  . Essential hypertension 06/05/2018  .  Hyperlipidemia LDL goal <70 06/05/2018  . Acute ischemic right MCA stroke ([place]) 06/05/2018  . Stroke (cerebrum) ([place]) - R MCA s/p tPA, embolic, source unknown 41/74/0814  . SVT (supraventricular tachycardia) ([place]) 05/28/2015  . PVC's (premature ventricular contractions) 05/28/2015  . BENIGN POSITIONAL VERTIGO 06/16/2009  . ALLERGIC RHINITIS 06/16/2009    [facility] ,Bertha, CCC-SLP  03/06/2020, 4:47 PM  [facility] [address] [place], [place], [address] Phone: [phone]   Fax:  [phone]   Name: Mary Mercado MRN: [id number] Date of Birth: 1940/06/27

## 2020-03-06 NOTE — Patient Instructions (Signed)
  Coordination Activities  Perform the following activities for 20 minutes 2 times per day with left hand(s).   Rotate ball in fingertips (clockwise and counter-clockwise).  Toss ball between hands.  Toss ball in air and catch with the same hand.  Flip cards 1 at a time as fast as you can.  Deal cards with your thumb (Hold deck in hand and push card off top with thumb).  Rotate card in hand (clockwise and counter-clockwise).  Pick up coins and place in container or coin bank.  Pick up coins and stack.  Pick up coins one at a time until you get 5-10 in your hand, then move coins from palm to fingertips to stack one at a time.

## 2020-03-06 NOTE — Therapy (Signed)
[facility] [address] [place], [place], [address] Phone: [phone]   Fax:  [phone]  Occupational Therapy Treatment  Patient Details  Name: Mary Mercado MRN: [id number] Date of Birth: 1941-03-07 Referring Provider (OT): [name], MD   Encounter Date: 03/06/2020   OT End of Session - 03/06/20 1318    Visit Number 2    Number of Visits 17    Date for OT Re-Evaluation 04/15/20    Authorization Type Medicare Part A and B; BCBS supplement    Authorization Time Period VL:MN No Auth    Authorization - Visit Number 2    Authorization - Number of Visits 10    Progress Note Due on Visit 10    OT Start Time 1318    OT Stop Time 1400    OT Time Calculation (min) 42 min    Activity Tolerance Patient tolerated treatment well    Behavior During Therapy [facility] for tasks assessed/performed;Impulsive           Past Medical History:  Diagnosis Date  . Allergy   . CVA (cerebral vascular accident) ([place]) 05/2018, 01/09/20  . Hyperlipidemia LDL goal <70   . Hypertension   . Palpitations   . Vertigo, benign positional     Past Surgical History:  Procedure Laterality Date  . CARDIOVERSION Left 10/31/2018   Procedure: CARDIOVERSION;  Surgeon: Acie Fredrickson Wonda Cheng, MD;  Location: [facility];  Service: Cardiovascular;  Laterality: Left;  . CARDIOVERSION N/A 01/08/2020   Procedure: CARDIOVERSION;  Surgeon: [name], MD;  Location: [facility] ENDOSCOPY;  Service: Cardiovascular;  Laterality: N/A;  . Hemilaminectomy and microdiskectomy at L4-5 on the left.  12/10/2003  . TEE WITHOUT CARDIOVERSION N/A 06/05/2018   Procedure: TRANSESOPHAGEAL ECHOCARDIOGRAM (TEE);  Surgeon: Lelon Perla, MD;  Location: [facility] ENDOSCOPY;  Service: Cardiovascular;  Laterality: N/A;  loop    There were no vitals filed for this visit.   Subjective Assessment - 03/06/20 1318    Subjective  "I'm still in A fib but no pain". Pt reports  doing more in the kitchen. Pt is moving around the kitchen more and preparing drinks. Pt is repotring increased ease with watching sports on television with vision    Pertinent History PMH - CAD, hx of CVA, PAF s/p DCCV, BPPV, a-fib (currently undergoing testing to stop a-fib but was told not a candidate for ablation and might have to live with a-fib), HLD.    Limitations Fall Risk, No Driving. A-fib    Patient Stated Goals Pt states goals as "get back to feeling like my real self" and reports wanting to get back to "cooking and daily chores"    Currently in Pain? No/denies                        OT Treatments/Exercises (OP) - 03/06/20 1331      Exercises   Exercises Hand      Cognitive Exercises   Other Cognitive Exercises 1 pt impulsive and requiring cues for taking time and paying attention to details during pegboard designs      Visual/Perceptual Exercises   Copy this Image Pegboard    Pegboard little difficulty with min errors with color contrast difficulty.      Fine Motor Coordination (Hand/Wrist)   Fine Motor Coordination Large Pegboard;Flipping cards;Dealing card with thumb;Picking up coins;Manipulating coins;Stacking coins;Tossing ball    Large Pegboard MEDIUM pegs - LUE    Flipping cards see  pt instructions                  OT Education - 03/06/20 1331    Education Details HEP coordination see pt instructions    Person(s) Educated Patient    Methods Explanation;Demonstration    Comprehension Verbalized understanding;Returned demonstration            OT Short Term Goals - 03/06/20 1325      OT SHORT TERM GOAL #1   Title Pt will be independent with HEP 03/18/2020    Time 4    Period Weeks    Status On-going    Target Date 03/18/20      OT SHORT TERM GOAL #2   Title Pt will demonstrate increased handwriting legibility of 3-5 sentences to 90%    Time 4    Period Weeks    Status New      OT SHORT TERM GOAL #3   Title Pt will tolerate 10  minutes of standing in order to increase standing tolerance for preparation for cooking and home management.    Time 4    Period Weeks    Status New      OT SHORT TERM GOAL #4   Title Pt will complete environmental scanning with 75% accuracy    Time 4    Period Weeks    Status New      OT SHORT TERM GOAL #5   Title Pt will complete simple warm meal prep and/or home management task with min A    Time 4    Period Weeks    Status New      OT SHORT TERM GOAL #6   Title Continue to assess vision and set goal PRN    Time 4    Period Weeks    Status New             OT Long Term Goals - 02/19/20 1613      OT LONG TERM GOAL #1   Title Pt will be independent with updated HEP 04/15/2020    Time 8    Period Weeks    Status New    Target Date 04/15/20      OT LONG TERM GOAL #2   Title Pt will improve coordination with LUE evidenced by completing 9 hole peg test in 35 seconds or less.    Baseline 42.06 seconds LUE    Time 8    Period Weeks    Status New      OT LONG TERM GOAL #3   Title Pt will complete simple warm meal prep and/or home management with supervision and good safety awareness    Time 8    Period Weeks    Status New      OT LONG TERM GOAL #4   Title Pt will complete environmental scanning with 90% accuracy    Time 8    Period Weeks    Status New      OT LONG TERM GOAL #5   Title Pt will tolerate 20 minutes of standing activities for increasing activity tolerance for increasing ability to complete home management activities and cooking.    Time 8    Period Weeks    Status New                 Plan - 03/06/20 1322    Clinical Impression Statement Pt is making progress towards goals. Pt continues to work hard and be motivated for improvement.  OT Occupational Profile and History Detailed Assessment- Review of Records and additional review of physical, cognitive, psychosocial history related to current functional performance    Occupational  performance deficits (Please refer to evaluation for details): ADL's;IADL's;Leisure    Body Structure / Function / Physical Skills ADL;UE functional use;FMC;Decreased knowledge of use of DME;Mobility;Sensation;Vision;Strength;Endurance;Coordination;IADL;Tone;Cardiopulmonary status limiting activity;Balance;Dexterity;GMC    Cognitive Skills Attention;Safety Awareness;Thought;Perception;Understand    Rehab Potential Good    Clinical Decision Making Limited treatment options, no task modification necessary    Comorbidities Affecting Occupational Performance: May have comorbidities impacting occupational performance    Modification or Assistance to Complete Evaluation  No modification of tasks or assist necessary to complete eval    OT Frequency 2x / week    OT Duration 8 weeks    OT Treatment/Interventions Self-care/ADL training;Therapeutic exercise;Moist Heat;Neuromuscular education;Patient/family education;Visual/perceptual remediation/compensation;Therapist, nutritional;Therapeutic activities;DME and/or AE instruction;Manual Therapy;Cognitive remediation/compensation;Balance training;Energy conservation    Plan standing balance and tolerance    Consulted and Agree with Plan of Care Patient;Family member/caregiver    Family Member Consulted daughter           Patient will benefit from skilled therapeutic intervention in order to improve the following deficits and impairments:   Body Structure / Function / Physical Skills: ADL, UE functional use, FMC, Decreased knowledge of use of DME, Mobility, Sensation, Vision, Strength, Endurance, Coordination, IADL, Tone, Cardiopulmonary status limiting activity, Balance, Dexterity, GMC Cognitive Skills: Attention, Safety Awareness, Thought, Perception, Understand     Visit Diagnosis: Muscle weakness (generalized)  Visuospatial deficit  Other lack of coordination  Frontal lobe and executive function deficit  Attention and concentration  deficit    Problem List Patient Active Problem List   Diagnosis Date Noted  . Hemiplegia and hemiparesis following cerebral infarction affecting left non-dominant side ([place]) 02/05/2020  . 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 CVA (cerebral vascular accident) ([place]) 01/09/2020  . Dizziness 11/30/2018  . Atrial fibrillation ([place]) 08/13/2018  . Prediabetes 06/15/2018  . Palpitations 06/05/2018  . Essential hypertension 06/05/2018  . Hyperlipidemia LDL goal <70 06/05/2018  . Acute ischemic right MCA stroke ([place]) 06/05/2018  . Stroke (cerebrum) ([place]) - R MCA s/p tPA, embolic, source unknown 56/38/9373  . SVT (supraventricular tachycardia) ([place]) 05/28/2015  . PVC's (premature ventricular contractions) 05/28/2015  . BENIGN POSITIONAL VERTIGO 06/16/2009  . ALLERGIC RHINITIS 06/16/2009    Zachery Conch MOT, OTR/L  03/06/2020, 2:03 PM  [facility] [address] [place], [place], [address] Phone: [phone]   Fax:  [phone]  Name: Mary Mercado MRN: [id number] Date of Birth: [birth year]

## 2020-03-07 ENCOUNTER — Other Ambulatory Visit: Payer: Self-pay | Admitting: Family Medicine

## 2020-03-10 ENCOUNTER — Ambulatory Visit: Payer: Medicare Other

## 2020-03-10 ENCOUNTER — Ambulatory Visit: Payer: Medicare Other | Admitting: Occupational Therapy

## 2020-03-10 ENCOUNTER — Encounter: Payer: Self-pay | Admitting: Occupational Therapy

## 2020-03-10 ENCOUNTER — Other Ambulatory Visit: Payer: Self-pay

## 2020-03-10 VITALS — BP 100/60 | HR 68

## 2020-03-10 DIAGNOSIS — R41844 Frontal lobe and executive function deficit: Secondary | ICD-10-CM | POA: Diagnosis not present

## 2020-03-10 DIAGNOSIS — R278 Other lack of coordination: Secondary | ICD-10-CM

## 2020-03-10 DIAGNOSIS — R2689 Other abnormalities of gait and mobility: Secondary | ICD-10-CM | POA: Diagnosis not present

## 2020-03-10 DIAGNOSIS — R2681 Unsteadiness on feet: Secondary | ICD-10-CM | POA: Diagnosis not present

## 2020-03-10 DIAGNOSIS — M6281 Muscle weakness (generalized): Secondary | ICD-10-CM

## 2020-03-10 DIAGNOSIS — R41840 Attention and concentration deficit: Secondary | ICD-10-CM

## 2020-03-10 DIAGNOSIS — R41842 Visuospatial deficit: Secondary | ICD-10-CM

## 2020-03-10 NOTE — Therapy (Signed)
[facility] [address] [place], [place], [address] Phone: [phone]   Fax:  [phone]  Occupational Therapy Treatment  Patient Details  Name: Mary Mercado MRN: [id number] Date of Birth: 10-26-1940 Referring Provider (OT): [name], MD   Encounter Date: 03/10/2020   OT End of Session - 03/10/20 1146    Visit Number 3    Number of Visits 17    Date for OT Re-Evaluation 04/15/20    Authorization Type Medicare Part A and B; BCBS supplement    Authorization Time Period VL:MN No Auth    Authorization - Visit Number 3    Authorization - Number of Visits 10    Progress Note Due on Visit 10    OT Start Time 1145    OT Stop Time 1225    OT Time Calculation (min) 40 min    Activity Tolerance Patient tolerated treatment well    Behavior During Therapy [facility] for tasks assessed/performed;Impulsive           Past Medical History:  Diagnosis Date  . Allergy   . CVA (cerebral vascular accident) ([place]) 05/2018, 01/09/20  . Hyperlipidemia LDL goal <70   . Hypertension   . Palpitations   . Vertigo, benign positional     Past Surgical History:  Procedure Laterality Date  . CARDIOVERSION Left 10/31/2018   Procedure: CARDIOVERSION;  Surgeon: Acie Fredrickson Wonda Cheng, MD;  Location: [facility];  Service: Cardiovascular;  Laterality: Left;  . CARDIOVERSION N/A 01/08/2020   Procedure: CARDIOVERSION;  Surgeon: [name], MD;  Location: [facility] ENDOSCOPY;  Service: Cardiovascular;  Laterality: N/A;  . Hemilaminectomy and microdiskectomy at L4-5 on the left.  12/10/2003  . TEE WITHOUT CARDIOVERSION N/A 06/05/2018   Procedure: TRANSESOPHAGEAL ECHOCARDIOGRAM (TEE);  Surgeon: Lelon Perla, MD;  Location: [facility] ENDOSCOPY;  Service: Cardiovascular;  Laterality: N/A;  loop    There were no vitals filed for this visit.   Subjective Assessment - 03/10/20 1146    Subjective  Pt denies any pain. Says she doesn't have  any changes or information to report. "I've been doing my shuffling"    Pertinent History PMH - CAD, hx of CVA, PAF s/p DCCV, BPPV, a-fib (currently undergoing testing to stop a-fib but was told not a candidate for ablation and might have to live with a-fib), HLD.    Limitations Fall Risk, No Driving. A-fib    Patient Stated Goals Pt states goals as "get back to feeling like my real self" and reports wanting to get back to "cooking and daily chores"    Currently in Pain? No/denies                        OT Treatments/Exercises (OP) - 03/10/20 1147      ADLs   Cooking demonstrated kitchen mobility and finding objects in cabinets and reaching overhead and under for items with supervision this day. Min verbal cues for safety and abandoning rollator    Home Maintenance standing tolerance with laundry. Pt folded laundry while maintaining static standing. Pt standing tolerance this day of 10 minutes with report of fatigue after      Cognitive Exercises   Handwriting slight micrographia noted with 3 sentences      Visual/Perceptual Exercises   Scanning Tabletop    Scanning - Tabletop Perfection board with min difficulty but self-correcting errors      Fine Motor Coordination (Hand/Wrist)   Fine Motor Coordination Grooved pegs  Grooved pegs BUE with placing pegs and removing with use of tweezers.                     OT Short Term Goals - 03/10/20 1203      OT SHORT TERM GOAL #1   Title Pt will be independent with HEP 03/18/2020    Time 4    Period Weeks    Status On-going    Target Date 03/18/20      OT SHORT TERM GOAL #2   Title Pt will demonstrate increased handwriting legibility of 3-5 sentences to 90%    Time 4    Period Weeks    Status On-going   slight micrographia noted     OT SHORT TERM GOAL #3   Title Pt will tolerate 10 minutes of standing in order to increase standing tolerance for preparation for cooking and home management.    Time 4     Period Weeks    Status On-going   completed home management in 10 minutes - SOB after activity     OT SHORT TERM GOAL #4   Title Pt will complete environmental scanning with 75% accuracy    Time 4    Period Weeks    Status New      OT SHORT TERM GOAL #5   Title Pt will complete simple warm meal prep and/or home management task with min A    Time 4    Period Weeks    Status On-going      OT SHORT TERM GOAL #6   Title Continue to assess vision and set goal PRN    Time 4    Period Weeks    Status On-going             OT Long Term Goals - 02/19/20 1613      OT LONG TERM GOAL #1   Title Pt will be independent with updated HEP 04/15/2020    Time 8    Period Weeks    Status New    Target Date 04/15/20      OT LONG TERM GOAL #2   Title Pt will improve coordination with LUE evidenced by completing 9 hole peg test in 35 seconds or less.    Baseline 42.06 seconds LUE    Time 8    Period Weeks    Status New      OT LONG TERM GOAL #3   Title Pt will complete simple warm meal prep and/or home management with supervision and good safety awareness    Time 8    Period Weeks    Status New      OT LONG TERM GOAL #4   Title Pt will complete environmental scanning with 90% accuracy    Time 8    Period Weeks    Status New      OT LONG TERM GOAL #5   Title Pt will tolerate 20 minutes of standing activities for increasing activity tolerance for increasing ability to complete home management activities and cooking.    Time 8    Period Weeks    Status New                 Plan - 03/10/20 1206    Clinical Impression Statement Pt continues to progress towards goals. Pt is progressing towards standing tolerance and home management tasks.    OT Occupational Profile and History Detailed Assessment- Review of Records and additional review of  physical, cognitive, psychosocial history related to current functional performance    Occupational performance deficits (Please refer to  evaluation for details): ADL's;IADL's;Leisure    Body Structure / Function / Physical Skills ADL;UE functional use;FMC;Decreased knowledge of use of DME;Mobility;Sensation;Vision;Strength;Endurance;Coordination;IADL;Tone;Cardiopulmonary status limiting activity;Balance;Dexterity;GMC    Cognitive Skills Attention;Safety Awareness;Thought;Perception;Understand    Rehab Potential Good    Clinical Decision Making Limited treatment options, no task modification necessary    Comorbidities Affecting Occupational Performance: May have comorbidities impacting occupational performance    Modification or Assistance to Complete Evaluation  No modification of tasks or assist necessary to complete eval    OT Frequency 2x / week    OT Duration 8 weeks    OT Treatment/Interventions Self-care/ADL training;Therapeutic exercise;Moist Heat;Neuromuscular education;Patient/family education;Visual/perceptual remediation/compensation;Therapist, nutritional;Therapeutic activities;DME and/or AE instruction;Manual Therapy;Cognitive remediation/compensation;Balance training;Energy conservation    Plan environmental scanning    Consulted and Agree with Plan of Care Patient;Family member/caregiver    Family Member Consulted spouse           Patient will benefit from skilled therapeutic intervention in order to improve the following deficits and impairments:   Body Structure / Function / Physical Skills: ADL, UE functional use, FMC, Decreased knowledge of use of DME, Mobility, Sensation, Vision, Strength, Endurance, Coordination, IADL, Tone, Cardiopulmonary status limiting activity, Balance, Dexterity, GMC Cognitive Skills: Attention, Safety Awareness, Thought, Perception, Understand     Visit Diagnosis: Other abnormalities of gait and mobility  Other lack of coordination  Visuospatial deficit  Muscle weakness (generalized)  Frontal lobe and executive function deficit  Attention and concentration  deficit    Problem List Patient Active Problem List   Diagnosis Date Noted  . Hemiplegia and hemiparesis following cerebral infarction affecting left non-dominant side ([place]) 02/05/2020  . 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 CVA (cerebral vascular accident) ([place]) 01/09/2020  . Dizziness 11/30/2018  . Atrial fibrillation ([place]) 08/13/2018  . Prediabetes 06/15/2018  . Palpitations 06/05/2018  . Essential hypertension 06/05/2018  . Hyperlipidemia LDL goal <70 06/05/2018  . Acute ischemic right MCA stroke ([place]) 06/05/2018  . Stroke (cerebrum) ([place]) - R MCA s/p tPA, embolic, source unknown 66/29/4765  . SVT (supraventricular tachycardia) ([place]) 05/28/2015  . PVC's (premature ventricular contractions) 05/28/2015  . BENIGN POSITIONAL VERTIGO 06/16/2009  . ALLERGIC RHINITIS 06/16/2009    Zachery Conch MOT, OTR/L  03/10/2020, 12:42 PM  [facility] [address] [place], [place], [address] Phone: [phone]   Fax:  [phone]  Name: Robbi Spells MRN: [id number] Date of Birth: 1940/05/06

## 2020-03-10 NOTE — Therapy (Signed)
[facility] [address] [place], [place], [address] Phone: [phone]   Fax:  [phone]  Physical Therapy Treatment  Patient Details  Name: Mary Mercado MRN: [id number] Date of Birth: 14-Jun-1940 Referring Provider (PT): [name], [facility]   Encounter Date: 03/10/2020   PT End of Session - 03/10/20 1104    Visit Number 7    Number of Visits 17    Date for PT Re-Evaluation 05/13/20    Authorization Type Medicare (PN every 10th visit) and BCBS supplemental    PT Start Time 1102    PT Stop Time 1142    PT Time Calculation (min) 40 min    Equipment Utilized During Treatment Other (comment);Gait belt    Activity Tolerance Patient tolerated treatment well;Patient limited by fatigue    Behavior During Therapy [facility] for tasks assessed/performed           Past Medical History:  Diagnosis Date  . Allergy   . CVA (cerebral vascular accident) ([place]) 05/2018, 01/09/20  . Hyperlipidemia LDL goal <70   . Hypertension   . Palpitations   . Vertigo, benign positional     Past Surgical History:  Procedure Laterality Date  . CARDIOVERSION Left 10/31/2018   Procedure: CARDIOVERSION;  Surgeon: Acie Fredrickson Wonda Cheng, MD;  Location: [facility];  Service: Cardiovascular;  Laterality: Left;  . CARDIOVERSION N/A 01/08/2020   Procedure: CARDIOVERSION;  Surgeon: [name], MD;  Location: [facility] ENDOSCOPY;  Service: Cardiovascular;  Laterality: N/A;  . Hemilaminectomy and microdiskectomy at L4-5 on the left.  12/10/2003  . TEE WITHOUT CARDIOVERSION N/A 06/05/2018   Procedure: TRANSESOPHAGEAL ECHOCARDIOGRAM (TEE);  Surgeon: Lelon Perla, MD;  Location: [facility] ENDOSCOPY;  Service: Cardiovascular;  Laterality: N/A;  loop    Vitals:   03/10/20 1104  BP: 100/60  Pulse: 68     Subjective Assessment - 03/10/20 1104    Subjective Pt reports doing well today. Her vitals were good this morning. Goes for sleep study on  Thursday night.    Patient is accompained by: Family member   [name]-husband   Pertinent History CAD, hx of CVA, PAF s/p DCCV, BPPV, a-fib (currently undergoing testing to stop a-fib but was told not a candidate for ablation and might have to live with a-fib), HLD    Limitations Standing;Walking    How long can you stand comfortably? 5 minutes    How long can you walk comfortably? 10-15 minutes    Patient Stated Goals To be able to feel good when I walk, improve endurance    Currently in Pain? No/denies                             [facility] Adult PT Treatment/Exercise - 03/10/20 1109      Ambulation/Gait   Ambulation/Gait Yes    Ambulation/Gait Assistance 5: Supervision;4: Min guard    Ambulation/Gait Assistance Details Pt was given verbal cues to try to relax arms to get some arm swing as well as try to increase step length. HR=88 after gait.  Ambulated in/out of clinic with rollator.    Ambulation Distance (Feet) 230 Feet    Assistive device None    Gait Pattern Step-through pattern;Decreased step length - right;Decreased step length - left;Decreased arm swing - right;Decreased arm swing - left    Ambulation Surface Level;Indoor    Gait velocity 12.01 sec=0.82ms or 2.73 ft/sec       Standardized Balance Assessment  Standardized Balance Assessment Timed Up and Go Test      Timed Up and Go Test   TUG Normal TUG    Normal TUG (seconds) 14.8   13.8 and 15.8 sec without AD     Neuro Re-ed    Neuro Re-ed Details  At counter: side stepping without UE support 6' x 4, marching forwards and then backwards gait 6' x 6. Standing with feet together eyes closed x 30 sec then on airex with feet together eyes open x 30 sec, eyes closed x 30 sec CGA with increased sway, head turns left/right x 10 with verbal cues to fix eyes each time she turned her head. HR=68 and BP=106/60.                     PT Short Term Goals - 03/10/20 1108      PT SHORT TERM GOAL #1   Title Pt  will be independent with HEP for improved strength, balance, dizziness, and gait.  TARGET DATE FOR ALL STGS: 03/12/20    Baseline Pt reports initial HEP is going well. She is intertwining them with her day.    Time 4    Period Weeks    Status Achieved      PT SHORT TERM GOAL #2   Title Perform BERG and DGI writes goals as indicated.    Time 4    Period Weeks    Status Achieved      PT SHORT TERM GOAL #3   Title Pt will improved gait speed to >/=2.56f/sec. with LRAD to improve gait efficiency.    Baseline 2.130fsec with rollator. 03/10/20 2.73 ft/ sec    Time 4    Period Weeks    Status Achieved      PT SHORT TERM GOAL #4   Title Pt will perform TUG with LRAD in </=13.5 sec. to decr. falls risk.    Baseline 18.16sec. with rollator. 03/10/20 14.8 sec without AD    Time 4    Period Weeks    Status On-going      PT SHORT TERM GOAL #5   Title Pt will amb. 300' at MOD I level with LRAD over even terrain to improve functional mobility.    Time 4    Period Weeks    Status New      PT SHORT TERM GOAL #6   Title Pt will improve BERG score to >/=48/56 to decr. falls risk.    Baseline 44/56    Time 4    Period Weeks    Status New      PT SHORT TERM GOAL #7   Title Pt will improve DGI score to >/=14/24 to decr. falls risk.    Baseline 11/24    Time 4    Period Weeks    Status New             PT Long Term Goals - 02/20/20 1351      PT LONG TERM GOAL #1   Title Pt will verbalize CVA s/s to decr. risk of additional CVA. TARGET DATE FOR ALL LTGS: 04/09/20    Time 8    Period Weeks    Status New      PT LONG TERM GOAL #2   Title Pt will improve gait with LRAD to >/=2.9495fec. to safely amb. in the community.    Baseline 2.75f30fc with rollator    Time 8    Period Weeks    Status New  PT LONG TERM GOAL #3   Title Pt will amb. 600' with LRAD at MOD I level oven even and uneven terrain to improve functional mobility.    Time 8    Period Weeks    Status New       PT LONG TERM GOAL #4   Title Perform vestibular assessment as indicated and write goals as needed.    Time 8    Period Weeks    Status New      PT LONG TERM GOAL #5   Title Pt will improve BERG score to >/=52/56 to decr. falls risk.    Baseline 44/56    Time 8    Period Weeks    Status New      Additional Long Term Goals   Additional Long Term Goals Yes      PT LONG TERM GOAL #6   Title Pt will improve DGI score to >/=16/24 to decr. falls risk.    Baseline 11/24    Time 8    Period Weeks    Status New                 Plan - 03/10/20 1148    Clinical Impression Statement PT started to check STGs. Pt met gait speed goal without AD today reaching 2.23ms. She decreased her TUG to 14.8 sec without AD showing improving balance and functional mobility just short of goal. Reported less dizziness with turning today but does need to take time and fix vision to help some. PT continued to work on gait without AD. Pt's vitals were better today. Pt continues to benefit from skilled PT.    Personal Factors and Comorbidities Age;Comorbidity 3+    Comorbidities CAD, hx of CVA, PAF s/p DCCV, BPPV, a-fib (currently undergoing testing to stop a-fib but was told not a candidate for ablation and might have to live with a-fib), HLD    Examination-Activity Limitations Bed Mobility;Bend;Bathing;Carry;Stand;Squat;Stairs;Toileting;Dressing;Transfers;Locomotion Level    Examination-Participation Restrictions Church;Yard Work;Driving;Community Activity;Laundry;Meal Prep;Cleaning    Stability/Clinical Decision Making Evolving/Moderate complexity    Rehab Potential Good    PT Frequency 2x / week    PT Duration 8 weeks    PT Treatment/Interventions ADLs/Self Care Home Management;Aquatic Therapy;Biofeedback;Canalith Repostioning;Balance training;Therapeutic exercise;Manual techniques;Vestibular;Therapeutic activities;Functional mobility training;Stair training;Gait training;Orthotic  Fit/Training;Patient/family education;DME Instruction;Neuromuscular re-education    PT Next Visit Plan Finish checking last couple STGs. Monitor BP and signs for a-fib and  orthostatic hypotension. Gait training with SPC with quad attachment (as that's what pt has at home) and without AD. Balance activities.    Consulted and Agree with Plan of Care Patient;Family member/caregiver    Family Member Consulted pt's husband: [name]           Patient will benefit from skilled therapeutic intervention in order to improve the following deficits and impairments:  Abnormal gait, Decreased endurance, Impaired sensation, Cardiopulmonary status limiting activity, Decreased knowledge of use of DME, Decreased activity tolerance, Decreased strength, Decreased balance, Decreased mobility, Dizziness, Impaired flexibility  Visit Diagnosis: Other abnormalities of gait and mobility  Muscle weakness (generalized)  Unsteadiness on feet     Problem List Patient Active Problem List   Diagnosis Date Noted  . Hemiplegia and hemiparesis following cerebral infarction affecting left non-dominant side ([place] 02/05/2020  . 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 CVA (cerebral vascular accident) ([place] 01/09/2020  . Dizziness 11/30/2018  . Atrial fibrillation ([place] 08/13/2018  . Prediabetes 06/15/2018  . Palpitations 06/05/2018  .  Essential hypertension 06/05/2018  . Hyperlipidemia LDL goal <70 06/05/2018  . Acute ischemic right MCA stroke ([place]) 06/05/2018  . Stroke (cerebrum) ([place]) - R MCA s/p tPA, embolic, source unknown 30/10/6224  . SVT (supraventricular tachycardia) ([place]) 05/28/2015  . PVC's (premature ventricular contractions) 05/28/2015  . BENIGN POSITIONAL VERTIGO 06/16/2009  . ALLERGIC RHINITIS 06/16/2009    Electa Sniff, PT, DPT, NCS 03/10/2020, 11:51 AM  [facility] [address] [place], [place],  [address] Phone: [phone]   Fax:  [phone]  Name: Joella Saefong MRN: [id number] Date of Birth: [birth year]

## 2020-03-12 ENCOUNTER — Ambulatory Visit (INDEPENDENT_AMBULATORY_CARE_PROVIDER_SITE_OTHER): Payer: Medicare Other | Admitting: Neurology

## 2020-03-12 ENCOUNTER — Other Ambulatory Visit: Payer: Self-pay

## 2020-03-12 ENCOUNTER — Ambulatory Visit: Payer: Medicare Other

## 2020-03-12 VITALS — BP 118/71 | HR 67

## 2020-03-12 DIAGNOSIS — R41844 Frontal lobe and executive function deficit: Secondary | ICD-10-CM | POA: Diagnosis not present

## 2020-03-12 DIAGNOSIS — M6281 Muscle weakness (generalized): Secondary | ICD-10-CM | POA: Diagnosis not present

## 2020-03-12 DIAGNOSIS — G4720 Circadian rhythm sleep disorder, unspecified type: Secondary | ICD-10-CM

## 2020-03-12 DIAGNOSIS — R0683 Snoring: Secondary | ICD-10-CM

## 2020-03-12 DIAGNOSIS — R351 Nocturia: Secondary | ICD-10-CM

## 2020-03-12 DIAGNOSIS — R2681 Unsteadiness on feet: Secondary | ICD-10-CM

## 2020-03-12 DIAGNOSIS — I4819 Other persistent atrial fibrillation: Secondary | ICD-10-CM

## 2020-03-12 DIAGNOSIS — R278 Other lack of coordination: Secondary | ICD-10-CM | POA: Diagnosis not present

## 2020-03-12 DIAGNOSIS — R41842 Visuospatial deficit: Secondary | ICD-10-CM | POA: Diagnosis not present

## 2020-03-12 DIAGNOSIS — R2689 Other abnormalities of gait and mobility: Secondary | ICD-10-CM

## 2020-03-12 DIAGNOSIS — R9431 Abnormal electrocardiogram [ECG] [EKG]: Secondary | ICD-10-CM

## 2020-03-12 DIAGNOSIS — I639 Cerebral infarction, unspecified: Secondary | ICD-10-CM

## 2020-03-12 DIAGNOSIS — G4733 Obstructive sleep apnea (adult) (pediatric): Secondary | ICD-10-CM

## 2020-03-12 DIAGNOSIS — Z8673 Personal history of transient ischemic attack (TIA), and cerebral infarction without residual deficits: Secondary | ICD-10-CM

## 2020-03-12 NOTE — Therapy (Signed)
[facility] [address] [place], [place], [address] Phone: [phone]   Fax:  [phone]  Physical Therapy Treatment  Patient Details  Name: Mary Mercado MRN: [id number] Date of Birth: Jul 24, 1940 Referring Provider (PT): [name], [facility]   Encounter Date: 03/12/2020   PT End of Session - 03/12/20 1609    Visit Number 8    Number of Visits 17    Date for PT Re-Evaluation 05/13/20    Authorization Type Medicare (PN every 10th visit) and BCBS supplemental    PT Start Time 1100    PT Stop Time 1140    PT Time Calculation (min) 40 min    Equipment Utilized During Treatment Gait belt    Activity Tolerance Treatment limited secondary to medical complications (Comment);Patient limited by fatigue   a-fib   Behavior During Therapy [facility] for tasks assessed/performed           Past Medical History:  Diagnosis Date  . Allergy   . CVA (cerebral vascular accident) ([place]) 05/2018, 01/09/20  . Hyperlipidemia LDL goal <70   . Hypertension   . Palpitations   . Vertigo, benign positional     Past Surgical History:  Procedure Laterality Date  . CARDIOVERSION Left 10/31/2018   Procedure: CARDIOVERSION;  Surgeon: Acie Fredrickson Wonda Cheng, MD;  Location: [facility];  Service: Cardiovascular;  Laterality: Left;  . CARDIOVERSION N/A 01/08/2020   Procedure: CARDIOVERSION;  Surgeon: [name], MD;  Location: [facility] ENDOSCOPY;  Service: Cardiovascular;  Laterality: N/A;  . Hemilaminectomy and microdiskectomy at L4-5 on the left.  12/10/2003  . TEE WITHOUT CARDIOVERSION N/A 06/05/2018   Procedure: TRANSESOPHAGEAL ECHOCARDIOGRAM (TEE);  Surgeon: Lelon Perla, MD;  Location: [facility] ENDOSCOPY;  Service: Cardiovascular;  Laterality: N/A;  loop    Vitals:   03/12/20 1120 03/12/20 1121  BP: 99/84 118/71  Pulse: (!) 146 67     Subjective Assessment - 03/12/20 1104    Subjective Pt denied falls or changes since last visit.  Pt is a little anxious about sleep study that's tonight.    Pertinent History CAD, hx of CVA, PAF s/p DCCV, BPPV, a-fib (currently undergoing testing to stop a-fib but was told not a candidate for ablation and might have to live with a-fib), HLD    Patient Stated Goals To be able to feel good when I walk, improve endurance    Currently in Pain? No/denies                             [facility] Adult PT Treatment/Exercise - 03/12/20 1107      Ambulation/Gait   Ambulation/Gait Yes    Ambulation/Gait Assistance 5: Supervision    Ambulation/Gait Assistance Details Pt had to take seated rest break after 230' 2/2 fatigue and pt known to have a-fib.     Ambulation Distance (Feet) 230 Feet   c rollator and 6' with cane c quad attachment.   Assistive device Rollator;Straight cane   cane with quad attachment   Gait Pattern Step-through pattern;Decreased step length - right;Decreased step length - left;Decreased arm swing - right;Decreased arm swing - left    Ambulation Surface Level;Indoor      Standardized Balance Assessment   Standardized Balance Assessment Berg Balance Test;Dynamic Gait Index      Berg Balance Test   Sit to Stand Able to stand without using hands and stabilize independently    Standing Unsupported Able to stand safely  2 minutes    Sitting with Back Unsupported but Feet Supported on Floor or Stool Able to sit safely and securely 2 minutes    Stand to Sit Sits safely with minimal use of hands    Transfers Able to transfer safely, minor use of hands    Standing Unsupported with Eyes Closed Able to stand 10 seconds safely    Standing Ubsupported with Feet Together Able to place feet together independently and stand 1 minute safely    From Standing, Reach Forward with Outstretched Arm Can reach confidently >25 cm (10")   10" RUE   From Standing Position, Pick up Object from Floor Able to pick up shoe, needs supervision    From Standing Position, Turn to Look Behind  Over each Shoulder Looks behind from both sides and weight shifts well   with pt reporting dizziness   Turn 360 Degrees Able to turn 360 degrees safely but slowly   8-9 sec.    Standing Unsupported, Alternately Place Feet on Step/Stool Able to stand independently and safely and complete 8 steps in 20 seconds    Standing Unsupported, One Foot in Front Able to place foot tandem independently and hold 30 seconds   RLE in front   Standing on One Leg Able to lift leg independently and hold > 10 seconds   11 sec.   Total Score 53                  PT Education - 03/12/20 1606    Education Details PT educated pt on the importance of notifying MD if HR continues to fluctuate and pt feels dizzy/woozy. PT educated pt on STG progress and outcome measure results.    Person(s) Educated Patient;Spouse    Methods Explanation    Comprehension Verbalized understanding            PT Short Term Goals - 03/12/20 1611      PT SHORT TERM GOAL #1   Title Pt will be independent with HEP for improved strength, balance, dizziness, and gait.  TARGET DATE FOR ALL STGS: 03/12/20    Baseline Pt reports initial HEP is going well. She is intertwining them with her day.    Time 4    Period Weeks    Status Achieved      PT SHORT TERM GOAL #2   Title Perform BERG and DGI writes goals as indicated.    Time 4    Period Weeks    Status Achieved      PT SHORT TERM GOAL #3   Title Pt will improved gait speed to >/=2.70f/sec. with LRAD to improve gait efficiency.    Baseline 2.124fsec with rollator. 03/10/20 2.73 ft/ sec    Time 4    Period Weeks    Status Achieved      PT SHORT TERM GOAL #4   Title Pt will perform TUG with LRAD in </=13.5 sec. to decr. falls risk.    Baseline 18.16sec. with rollator. 03/10/20 14.8 sec without AD    Time 4    Period Weeks    Status On-going      PT SHORT TERM GOAL #5   Title Pt will amb. 300' at MOD I level with LRAD over even terrain to improve functional mobility.     Time 4    Period Weeks    Status Not Met      PT SHORT TERM GOAL #6   Title Pt will improve BERG score to >/=  48/56 to decr. falls risk.    Baseline 44/56, 53/56 on 03/12/20    Time 4    Period Weeks    Status Achieved      PT SHORT TERM GOAL #7   Title Pt will improve DGI score to >/=14/24 to decr. falls risk.    Baseline 11/24    Time 4    Period Weeks    Status New             PT Long Term Goals - 03/12/20 1611      PT LONG TERM GOAL #1   Title Pt will verbalize CVA s/s to decr. risk of additional CVA. TARGET DATE FOR ALL LTGS: 04/09/20    Time 8    Period Weeks    Status New      PT LONG TERM GOAL #2   Title Pt will improve gait with LRAD to >/=2.75f/sec. to safely amb. in the community.    Baseline 2.182fsec with rollator    Time 8    Period Weeks    Status New      PT LONG TERM GOAL #3   Title Pt will amb. 600' with LRAD at MOD I level oven even and uneven terrain to improve functional mobility.    Time 8    Period Weeks    Status New      PT LONG TERM GOAL #4   Title Perform vestibular assessment as indicated and write goals as needed.    Time 8    Period Weeks    Status New      PT LONG TERM GOAL #5   Title Pt will improve BERG score to >/=56/56 to decr. falls risk.    Baseline 44/56 eval, 53/56 on 03/12/20    Time 8    Period Weeks    Status Revised      PT LONG TERM GOAL #6   Title Pt will improve DGI score to >/=16/24 to decr. falls risk.    Baseline 11/24    Time 8    Period Weeks    Status New                 Plan - 03/12/20 1613    Clinical Impression Statement Pt demonstrated progress as she met STGs 1, 2, 3,6 and LTG 5 (revised) and partially met STGs 4 and 5. PT will assess STG 7. Today's session limited 2/2 pt c/o dizziness/wooziness during BERG and amb., and pt's HR was fluctuating significantly during session. It improved with seated rest and pt will contact MD as needed. Continue with POC.    Personal Factors and  Comorbidities Age;Comorbidity 3+    Comorbidities CAD, hx of CVA, PAF s/p DCCV, BPPV, a-fib (currently undergoing testing to stop a-fib but was told not a candidate for ablation and might have to live with a-fib), HLD    Examination-Activity Limitations Bed Mobility;Bend;Bathing;Carry;Stand;Squat;Stairs;Toileting;Dressing;Transfers;Locomotion Level    Examination-Participation Restrictions Church;Yard Work;Driving;Community Activity;Laundry;Meal Prep;Cleaning    Stability/Clinical Decision Making Evolving/Moderate complexity    Rehab Potential Good    PT Frequency 2x / week    PT Duration 8 weeks    PT Treatment/Interventions ADLs/Self Care Home Management;Aquatic Therapy;Biofeedback;Canalith Repostioning;Balance training;Therapeutic exercise;Manual techniques;Vestibular;Therapeutic activities;Functional mobility training;Stair training;Gait training;Orthotic Fit/Training;Patient/family education;DME Instruction;Neuromuscular re-education    PT Next Visit Plan Finish checking DGI STG. Monitor BP and signs for a-fib and orthostatic hypotension. Gait training with SPC with quad attachment (as that's what pt has at home) and without AD. Balance  activities.    Consulted and Agree with Plan of Care Patient;Family member/caregiver    Family Member Consulted pt's husband: [name]           Patient will benefit from skilled therapeutic intervention in order to improve the following deficits and impairments:  Abnormal gait, Decreased endurance, Impaired sensation, Cardiopulmonary status limiting activity, Decreased knowledge of use of DME, Decreased activity tolerance, Decreased strength, Decreased balance, Decreased mobility, Dizziness, Impaired flexibility  Visit Diagnosis: Other abnormalities of gait and mobility  Other lack of coordination  Unsteadiness on feet  Muscle weakness (generalized)     Problem List Patient Active Problem List   Diagnosis Date Noted  . Hemiplegia and hemiparesis  following cerebral infarction affecting left non-dominant side ([place]) 02/05/2020  . 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 CVA (cerebral vascular accident) ([place]) 01/09/2020  . Dizziness 11/30/2018  . Atrial fibrillation ([place]) 08/13/2018  . Prediabetes 06/15/2018  . Palpitations 06/05/2018  . Essential hypertension 06/05/2018  . Hyperlipidemia LDL goal <70 06/05/2018  . Acute ischemic right MCA stroke ([place]) 06/05/2018  . Stroke (cerebrum) ([place]) - R MCA s/p tPA, embolic, source unknown 94/00/0505  . SVT (supraventricular tachycardia) ([place]) 05/28/2015  . PVC's (premature ventricular contractions) 05/28/2015  . BENIGN POSITIONAL VERTIGO 06/16/2009  . ALLERGIC RHINITIS 06/16/2009    Harsha Yusko L 03/12/2020, 4:15 PM  [facility] [address] [place], [place], [address] Phone: [phone]   Fax:  [phone]  Name: Annaliz Aven MRN: [id number] Date of Birth: 14-Feb-1941  Geoffry Paradise, PT,DPT 03/12/20 4:15 PM Phone: [phone] Fax: [phone]

## 2020-03-13 ENCOUNTER — Encounter: Payer: Medicare Other | Admitting: Occupational Therapy

## 2020-03-16 ENCOUNTER — Other Ambulatory Visit: Payer: Self-pay

## 2020-03-16 ENCOUNTER — Institutional Professional Consult (permissible substitution): Payer: Medicare Other | Admitting: Neurology

## 2020-03-16 ENCOUNTER — Ambulatory Visit (HOSPITAL_COMMUNITY)
Admission: RE | Admit: 2020-03-16 | Discharge: 2020-03-16 | Disposition: A | Discharge status: Home / Self Care | Payer: Medicare Other | Source: Ambulatory Visit | Attending: Nurse Practitioner | Admitting: Nurse Practitioner

## 2020-03-16 VITALS — BP 130/64 | HR 144 | Ht 65.0 in | Wt 134.4 lb

## 2020-03-16 DIAGNOSIS — D6869 Other thrombophilia: Secondary | ICD-10-CM

## 2020-03-16 DIAGNOSIS — I4819 Other persistent atrial fibrillation: Secondary | ICD-10-CM

## 2020-03-16 DIAGNOSIS — Z8673 Personal history of transient ischemic attack (TIA), and cerebral infarction without residual deficits: Secondary | ICD-10-CM | POA: Diagnosis not present

## 2020-03-16 DIAGNOSIS — I482 Chronic atrial fibrillation, unspecified: Secondary | ICD-10-CM | POA: Insufficient documentation

## 2020-03-16 DIAGNOSIS — Z7901 Long term (current) use of anticoagulants: Secondary | ICD-10-CM | POA: Insufficient documentation

## 2020-03-16 DIAGNOSIS — Z79899 Other long term (current) drug therapy: Secondary | ICD-10-CM | POA: Insufficient documentation

## 2020-03-16 LAB — BASIC METABOLIC PANEL
Anion gap: 13 (ref 5–15)
BUN: 12 mg/dL (ref 8–23)
CO2: 23 mmol/L (ref 22–32)
Calcium: 9 mg/dL (ref 8.9–10.3)
Chloride: 102 mmol/L (ref 98–111)
Creatinine, Ser: 0.8 mg/dL (ref 0.44–1.00)
GFR, Estimated: >60 mL/min (ref >60)
Glucose, Bld: 129 mg/dL — ABNORMAL HIGH (ref 70–99)
Potassium: 3.5 mmol/L (ref 3.5–5.1)
Sodium: 138 mmol/L (ref 135–145)

## 2020-03-16 LAB — MAGNESIUM: Magnesium: 1.9 mg/dL (ref 1.7–2.4)

## 2020-03-16 NOTE — Progress Notes (Addendum)
Primary Care Physician: [name], MD Referring Physician: Dr. Wendall Mola Motsinger Mary Mercado is a 79 y.o. 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 A 30 day monitor was placed and she was found to have afib. Eliquis was started for a CHA2DS2VASc score of 6. She had return to afib in July 2020 and a successful cardioversion was performed. She then was in her usual state of health until September 2021 when she presented to Dr. Acie Fredrickson with afib with RVR. She was set up for cardioversion, which was successful, stated no known missed eliquis doses. She was started on flecainide and had a stress test the next day. She developed stroke symptoms while at the [facility] street office and was sent directly to the [facility] ER as Code stroke.   She had f/u with Dr. Acie Fredrickson a few days ago and was in afib with RVR. He stopped flecainide. She was changed to Pradaxa after the last stroke.  She  was sent here to discuss options to return to SR. I do not believe she will an ablation candidate as she had a recent stroke and her rt and left  upper chambers are severely dilated. Her EKG showed v rate at 165 bpm. However, pulse ox worn during the visit, showed heart rates in the 100's. They are using 30 mg cardizem as needed to control v rates at home as well at her 240 mg daily. It appears that her best options to get back in rhythm would be amiodarone or tikosyn, but either way the pt and her husband are very leary re a repeat cardioversion. She would not be an ablation candidate for her recent stroke and enlarged atrium. She appears to be tolerating the afib ok.   F/u in afib clinic, 10/25/2, after increase of Cardizem for better rate control  as the pt and her husband did not want to pursue SR if that meant another cardioversion for fear of repeat stroke. Her v rate is 140 in afib today, but her readings at home show 93 to 116  bpm. Pulse ox during the visit showed HR's 80's to 120 bpm. She states that  she feels well. She  has not noted any adverse effect from  increase in Cardizem. Pt  and husband  still hesitant to consider any plans that may require another cardioversion. She is being compliant with her Pradaxa for CHA2DS2VASc score of 6.  I did mention that a TEE could be done with another cardioversion attempt if needed to minimize  her stroke risk and tikosyn itself may convert her with a cardioversion needed.  F/u in afib clinic 10/28. She has been started on metoprolol 12.5 mg bid in addition to Cardizem. Her HR by her husband's V/S checks look  Improved,  she feels well. Her BP on presentation here was 88/56, but recheck by me 120/60. Her BP's  at home look stable . HR's are trending down to 80's and 90's.   F/u in afib clinic, 11/15. She remains in reasonable rate controlled afib at home with stable BP. She has RVR in the office today.  However, the pt and her husband both state that her overall QOL seems to be suffering in afib. She  gets very tired in the afternoon and just has enough energy to sit on the couch for the rest of the evening. We discussed rhythm control again today.   F/u in afib clinic, 11/22. She for the most part  is staying rate controlled with an occasional  30 mg Cardizem. She is always anxious when she sees me and her v rates are elevated. She and  her husband have been trying to avoid another cardioversion with option for rate control  for h/o of prior stroke 24 hours after  cardioversion but her QOL  has declined and they are now seriously considering another approach. She  gets very fatigued later in the day and just has the energy to sit on the couch until bedtime.She  feels very anxious/nervous  in her chest most of the time. Her husband has seen a decline in her activities.    Today, she denies symptoms of palpitations, chest pain, shortness of breath, orthopnea, PND, lower extremity edema, dizziness, presyncope, syncope, or neurologic sequela. The patient is  tolerating medications without difficulties and is otherwise without complaint today.   Past Medical History:  Diagnosis Date  . Allergy   . CVA (cerebral vascular accident) ([place]) 05/2018, 01/09/20  . Hyperlipidemia LDL goal <70   . Hypertension   . Palpitations   . Vertigo, benign positional    Past Surgical History:  Procedure Laterality Date  . CARDIOVERSION Left 10/31/2018   Procedure: CARDIOVERSION;  Surgeon: Acie Fredrickson Wonda Cheng, MD;  Location: [facility];  Service: Cardiovascular;  Laterality: Left;  . CARDIOVERSION N/A 01/08/2020   Procedure: CARDIOVERSION;  Surgeon: [name], MD;  Location: [facility] ENDOSCOPY;  Service: Cardiovascular;  Laterality: N/A;  . Hemilaminectomy and microdiskectomy at L4-5 on the left.  12/10/2003  . TEE WITHOUT CARDIOVERSION N/A 06/05/2018   Procedure: TRANSESOPHAGEAL ECHOCARDIOGRAM (TEE);  Surgeon: Lelon Perla, MD;  Location: [facility] ENDOSCOPY;  Service: Cardiovascular;  Laterality: N/A;  loop    Current Outpatient Medications  Medication Sig Dispense Refill  . atorvastatin (LIPITOR) 80 MG tablet TAKE 1 TABLET (80 MG TOTAL) BY MOUTH DAILY AT 6 PM. 30 tablet 0  . diltiazem (CARDIZEM CD) 120 MG 24 hr capsule Take 1 capsule (120 mg total) by mouth at bedtime. 30 capsule 3  . diltiazem (CARDIZEM CD) 240 MG 24 hr capsule Take 1 capsule (240 mg total) by mouth daily. 90 capsule 3  . diltiazem (CARDIZEM) 30 MG tablet Take 1 tablet (30 mg total) by mouth 4 (four) times daily as needed. Prn 120 tablet 11  . KLOR-CON M10 10 MEQ tablet TAKE 1 TABLET BY MOUTH EVERY DAY 30 tablet 0  . metoprolol tartrate (LOPRESSOR) 25 MG tablet Take 0.5 tablets (12.5 mg total) by mouth 2 (two) times daily. 90 tablet 3  . PRADAXA 150 MG CAPS capsule TAKE 1 CAPSULE (150 MG TOTAL) BY MOUTH EVERY 12 (TWELVE) HOURS. 60 capsule 0   No current facility-administered medications for this encounter.   Facility-Administered Medications Ordered in Other Encounters  Medication Dose Route  Frequency Provider Last Rate Last Admin  . regadenoson (LEXISCAN) injection SOLN 0.4 mg  0.4 mg Intravenous Once Buford Dresser, MD      . technetium tetrofosmin (TC-MYOVIEW) injection 32 millicurie  32 millicurie Intravenous Once PRN Buford Dresser, MD        Allergies  Allergen Reactions  . Duloxetine     Causes Bp elevation.  Increased body jerks, nausea  . Acetaminophen Hives and Rash    Social History   Socioeconomic History  . Marital status: Married    Spouse name: [name]  . Number of children: Not on file  . Years of education: 12+  . Highest education level: Not on file  Occupational History  Comment: retired  Tobacco Use  . Smoking status: Never Smoker  . Smokeless tobacco: Never Used  Vaping Use  . Vaping Use: Never used  Substance and Sexual Activity  . Alcohol use: Never  . Drug use: Never  . Sexual activity: Not on file  Other Topics Concern  . Not on file  Social History Narrative   Lives with husband   caffeine none now   Social Determinants of Health   Financial Resource Strain:   . Difficulty of Paying Living Expenses: Not on file  Food Insecurity:   . Worried About Charity fundraiser in the Last Year: Not on file  . Ran Out of Food in the Last Year: Not on file  Transportation Needs:   . Lack of Transportation (Medical): Not on file  . Lack of Transportation (Non-Medical): Not on file  Physical Activity:   . Days of Exercise per Week: Not on file  . Minutes of Exercise per Session: Not on file  Stress:   . Feeling of Stress : Not on file  Social Connections:   . Frequency of Communication with Friends and Family: Not on file  . Frequency of Social Gatherings with Friends and Family: Not on file  . Attends Religious Services: Not on file  . Active Member of Clubs or Organizations: Not on file  . Attends Archivist Meetings: Not on file  . Marital Status: Not on file  Intimate Partner Violence:   . Fear of  Current or Ex-Partner: Not on file  . Emotionally Abused: Not on file  . Physically Abused: Not on file  . Sexually Abused: Not on file    Family History  Problem Relation Age of Onset  . Colon cancer Father   . Diabetes Mother   . Heart attack Mother        31  . Heart attack Brother 71    ROS- All systems are reviewed and negative except as per the HPI above  Physical Exam: Vitals:   03/16/20 1434  BP: 130/64  Pulse: (!) 144  Weight: 61 kg  Height: 5\' 5"  (1.651 m)   Wt Readings from Last 3 Encounters:  03/16/20 61 kg  03/02/20 62.3 kg  02/27/20 62.1 kg    Labs: Lab Results  Component Value Date   NA 139 01/20/2020   K 4.1 01/20/2020   CL 103 01/20/2020   CO2 30 01/20/2020   GLUCOSE 119 (H) 01/20/2020   BUN 9 01/20/2020   CREATININE 0.63 01/20/2020   CALCIUM 9.4 01/20/2020   MG 1.8 01/14/2020   Lab Results  Component Value Date   INR 1.3 (H) 01/09/2020   Lab Results  Component Value Date   CHOL 183 01/10/2020   HDL 92 01/10/2020   LDLCALC 81 01/10/2020   TRIG 52 01/10/2020     GEN- The patient is well appearing, alert and oriented x 3 today.   Head- normocephalic, atraumatic Eyes-  Sclera clear, conjunctiva pink Ears- hearing intact Oropharynx- clear Neck- supple, no JVP Lymph- no cervical lymphadenopathy Lungs- Clear to ausculation bilaterally, normal work of breathing Hear irregular rate and rhythm, no murmurs, rubs or gallops, PMI not laterally displaced GI- soft, NT, ND, + BS Extremities- no clubbing, cyanosis, or edema MS- no significant deformity or atrophy Skin- no rash or lesion Psych- euthymic mood, full affect Neuro- strength and sensation are intact  EKG-afib at 112 bpm , qrs int 76 bpm, qtc 421 ms  (HR's  at home  80's to 90's) Echo-1. The left ventricle has normal systolic function of 14-43%. The cavity  size was normal. There is no increased left ventricular wall thickness.  Echo evidence of pseudonormalization in diastolic  relaxation and  indeterminate left ventricular filling  pressure.  2. Grade 2 diastolic dysfunction.  3. The right ventricle has normal systolic function. The cavity was  normal. There is no increase in right ventricular wall thickness.  4. Left atrial size was severely dilated.  5. Right atrial size was severely dilated.  6. The mitral valve is normal in structure.  7. The tricuspid valve is normal in structure. Tricuspid valve  regurgitation is moderate.  8. The aortic valve is normal in structure. There is moderate thickening  and moderate calcification of the aortic valve.  9. The pulmonic valve was normal in structure.      Assessment and Plan: 1. Afib with RVR   CVA associated with 24 hours of last  cardioversion, 01/08/20, which was successful but unfortunately she has had ERAF, flecainide was stopped  She appears to be reasonably rate controlled looking at readings at home,  but not optimally rate controlled, she always has RVR in the office with me Takes 30 mg Cardizem as needed to control rates  She is on 240 mg cardizem in am and 120 mg cardizem in pm  She gets fatigued easily  She will not be a candidate for ablation for her recent stroke and severly dilated atrium The pt and her husband are very fearful of another cardioversion which will likely be needed  if we try to restore SR I talked to them re  afib nodal ablation with PPM, vrs rhythm control but that would entail another CV   I discussed with Dr. Caryl Comes and he is still concerned regarding chance of stroke with repeat cardioversion,  even with TEE, he will try to look for studies that address this issue and I will schedule an appointment with him to discuss the above two options.   2. CHA2DS2VASc score of at least 6 Continue  pradaxa 150 mg bid  States no missed doses  Butch Penny C. Angelynn Lemus, [facility] [address] [place], [place] [address] [phone]

## 2020-03-17 ENCOUNTER — Encounter: Payer: Self-pay | Admitting: Occupational Therapy

## 2020-03-17 ENCOUNTER — Ambulatory Visit: Payer: Medicare Other | Admitting: Occupational Therapy

## 2020-03-17 ENCOUNTER — Ambulatory Visit: Payer: Medicare Other

## 2020-03-17 VITALS — BP 108/70 | HR 82

## 2020-03-17 DIAGNOSIS — R41844 Frontal lobe and executive function deficit: Secondary | ICD-10-CM | POA: Diagnosis not present

## 2020-03-17 DIAGNOSIS — R2681 Unsteadiness on feet: Secondary | ICD-10-CM

## 2020-03-17 DIAGNOSIS — R41842 Visuospatial deficit: Secondary | ICD-10-CM

## 2020-03-17 DIAGNOSIS — M6281 Muscle weakness (generalized): Secondary | ICD-10-CM

## 2020-03-17 DIAGNOSIS — R278 Other lack of coordination: Secondary | ICD-10-CM | POA: Diagnosis not present

## 2020-03-17 DIAGNOSIS — R2689 Other abnormalities of gait and mobility: Secondary | ICD-10-CM | POA: Diagnosis not present

## 2020-03-17 DIAGNOSIS — R41840 Attention and concentration deficit: Secondary | ICD-10-CM

## 2020-03-17 NOTE — Therapy (Signed)
[facility] [address] [place], [place], [address] Phone: [phone]   Fax:  [phone]  Physical Therapy Treatment  Patient Details  Name: Mary Mercado MRN: [id number] Date of Birth: 07-05-1940 Referring Provider (PT): [name], [facility]   Encounter Date: 03/17/2020   PT End of Session - 03/17/20 1146    Visit Number 9    Number of Visits 17    Date for PT Re-Evaluation 05/13/20    Authorization Type Medicare (PN every 10th visit) and BCBS supplemental    PT Start Time 1145    PT Stop Time 1230    PT Time Calculation (min) 45 min    Equipment Utilized During Treatment Gait belt    Activity Tolerance Treatment limited secondary to medical complications (Comment);Patient limited by fatigue   a-fib   Behavior During Therapy [facility] for tasks assessed/performed           Past Medical History:  Diagnosis Date  . Allergy   . CVA (cerebral vascular accident) ([place]) 05/2018, 01/09/20  . Hyperlipidemia LDL goal <70   . Hypertension   . Palpitations   . Vertigo, benign positional     Past Surgical History:  Procedure Laterality Date  . CARDIOVERSION Left 10/31/2018   Procedure: CARDIOVERSION;  Surgeon: Acie Fredrickson Wonda Cheng, MD;  Location: [facility];  Service: Cardiovascular;  Laterality: Left;  . CARDIOVERSION N/A 01/08/2020   Procedure: CARDIOVERSION;  Surgeon: [name], MD;  Location: [facility] ENDOSCOPY;  Service: Cardiovascular;  Laterality: N/A;  . Hemilaminectomy and microdiskectomy at L4-5 on the left.  12/10/2003  . TEE WITHOUT CARDIOVERSION N/A 06/05/2018   Procedure: TRANSESOPHAGEAL ECHOCARDIOGRAM (TEE);  Surgeon: Lelon Perla, MD;  Location: [facility] ENDOSCOPY;  Service: Cardiovascular;  Laterality: N/A;  loop    Vitals:   03/17/20 1149  BP: 108/70  Pulse: 82     Subjective Assessment - 03/17/20 1147    Subjective Pt reports that she saw the A-fib doctor yesterday. Is tentatively going in  to hospital 12/6 for tikosyn medication to try to get heart back in rythmn. If does not work would need a cardioversion. Pt reports that sleep study went well but has not gotten results yet.    Pertinent History CAD, hx of CVA, PAF s/p DCCV, BPPV, a-fib (currently undergoing testing to stop a-fib but was told not a candidate for ablation and might have to live with a-fib), HLD    Patient Stated Goals To be able to feel good when I walk, improve endurance    Currently in Pain? No/denies                             [facility] Adult PT Treatment/Exercise - 03/17/20 1153      Ambulation/Gait   Ambulation/Gait Yes    Ambulation/Gait Assistance 5: Supervision    Ambulation/Gait Assistance Details Pt was cued to try to increase step length and relax left arm. Pt reports fatigue at end needing to rest.    Ambulation Distance (Feet) 200 Feet    Assistive device Straight cane   with quad tip   Gait Pattern Step-through pattern;Decreased step length - right;Decreased step length - left;Decreased arm swing - left    Ambulation Surface Level;Indoor    Stairs Yes    Stairs Assistance 5: Supervision    Stairs Assistance Details (indicate cue type and reason) reciprocal up and step-to down    Stair Management Technique Two rails;Alternating  pattern;Step to pattern    Number of Stairs 4    Gait Comments HR after gait=88      Standardized Balance Assessment   Standardized Balance Assessment Dynamic Gait Index      Dynamic Gait Index   Level Surface Mild Impairment    Change in Gait Speed Mild Impairment    Gait with Horizontal Head Turns Mild Impairment    Gait with Vertical Head Turns Mild Impairment    Gait and Pivot Turn Mild Impairment    Step Over Obstacle Mild Impairment    Step Around Obstacles Normal    Steps Moderate Impairment    Total Score 16      Neuro Re-ed    Neuro Re-ed Details  Along counter: marching gait without UE support 8' x 4, tandem gait 8' x 6. Sit to stand  x 10 from mat with blue mat under feet without hands getting balance each day. HR=82 after. Standing on blue mat with feet together with head turns left/right x 10, then 30 sec with manual pertubations.                  PT Education - 03/17/20 1300    Education Details Educated on results of DGI testing    Person(s) Educated Patient;Spouse    Methods Explanation    Comprehension Verbalized understanding            PT Short Term Goals - 03/17/20 1212      PT SHORT TERM GOAL #1   Title Pt will be independent with HEP for improved strength, balance, dizziness, and gait.  TARGET DATE FOR ALL STGS: 03/12/20    Baseline Pt reports initial HEP is going well. She is intertwining them with her day.    Time 4    Period Weeks    Status Achieved      PT SHORT TERM GOAL #2   Title Perform BERG and DGI writes goals as indicated.    Time 4    Period Weeks    Status Achieved      PT SHORT TERM GOAL #3   Title Pt will improved gait speed to >/=2.22f/sec. with LRAD to improve gait efficiency.    Baseline 2.165fsec with rollator. 03/10/20 2.73 ft/ sec    Time 4    Period Weeks    Status Achieved      PT SHORT TERM GOAL #4   Title Pt will perform TUG with LRAD in </=13.5 sec. to decr. falls risk.    Baseline 18.16sec. with rollator. 03/10/20 14.8 sec without AD    Time 4    Period Weeks    Status On-going      PT SHORT TERM GOAL #5   Title Pt will amb. 300' at MOD I level with LRAD over even terrain to improve functional mobility.    Time 4    Period Weeks    Status Not Met      PT SHORT TERM GOAL #6   Title Pt will improve BERG score to >/=48/56 to decr. falls risk.    Baseline 44/56, 53/56 on 03/12/20    Time 4    Period Weeks    Status Achieved      PT SHORT TERM GOAL #7   Title Pt will improve DGI score to >/=14/24 to decr. falls risk.    Baseline 11/24. 03/17/20 16/24    Time 4    Period Weeks    Status Achieved  PT Long Term Goals - 03/12/20  1611      PT LONG TERM GOAL #1   Title Pt will verbalize CVA s/s to decr. risk of additional CVA. TARGET DATE FOR ALL LTGS: 04/09/20    Time 8    Period Weeks    Status New      PT LONG TERM GOAL #2   Title Pt will improve gait with LRAD to >/=2.5f/sec. to safely amb. in the community.    Baseline 2.189fsec with rollator    Time 8    Period Weeks    Status New      PT LONG TERM GOAL #3   Title Pt will amb. 600' with LRAD at MOD I level oven even and uneven terrain to improve functional mobility.    Time 8    Period Weeks    Status New      PT LONG TERM GOAL #4   Title Perform vestibular assessment as indicated and write goals as needed.    Time 8    Period Weeks    Status New      PT LONG TERM GOAL #5   Title Pt will improve BERG score to >/=56/56 to decr. falls risk.    Baseline 44/56 eval, 53/56 on 03/12/20    Time 8    Period Weeks    Status Revised      PT LONG TERM GOAL #6   Title Pt will improve DGI score to >/=16/24 to decr. falls risk.    Baseline 11/24    Time 8    Period Weeks    Status New                 Plan - 03/17/20 1301    Clinical Impression Statement Pt met DGI goal today but still in high fall risk category with score of 16/24. Pt's HR was lower today but irregular. She continues to need seated rest break periodically due to fatigue.    Personal Factors and Comorbidities Age;Comorbidity 3+    Comorbidities CAD, hx of CVA, PAF s/p DCCV, BPPV, a-fib (currently undergoing testing to stop a-fib but was told not a candidate for ablation and might have to live with a-fib), HLD    Examination-Activity Limitations Bed Mobility;Bend;Bathing;Carry;Stand;Squat;Stairs;Toileting;Dressing;Transfers;Locomotion Level    Examination-Participation Restrictions Church;Yard Work;Driving;Community Activity;Laundry;Meal Prep;Cleaning    Stability/Clinical Decision Making Evolving/Moderate complexity    Rehab Potential Good    PT Frequency 2x / week    PT  Duration 8 weeks    PT Treatment/Interventions ADLs/Self Care Home Management;Aquatic Therapy;Biofeedback;Canalith Repostioning;Balance training;Therapeutic exercise;Manual techniques;Vestibular;Therapeutic activities;Functional mobility training;Stair training;Gait training;Orthotic Fit/Training;Patient/family education;DME Instruction;Neuromuscular re-education    PT Next Visit Plan 10th visit progress note next visit.  Monitor BP and signs for a-fib and orthostatic hypotension. Gait training with SPC with quad attachment (as that's what pt has at home) and without AD. Balance activities.    Consulted and Agree with Plan of Care Patient;Family member/caregiver    Family Member Consulted pt's husband: [name]           Patient will benefit from skilled therapeutic intervention in order to improve the following deficits and impairments:  Abnormal gait, Decreased endurance, Impaired sensation, Cardiopulmonary status limiting activity, Decreased knowledge of use of DME, Decreased activity tolerance, Decreased strength, Decreased balance, Decreased mobility, Dizziness, Impaired flexibility  Visit Diagnosis: Other abnormalities of gait and mobility  Muscle weakness (generalized)  Unsteadiness on feet     Problem List Patient Active Problem List  Diagnosis Date Noted  . Hemiplegia and hemiparesis following cerebral infarction affecting left non-dominant side ([place]) 02/05/2020  . 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 CVA (cerebral vascular accident) ([place]) 01/09/2020  . Dizziness 11/30/2018  . Atrial fibrillation ([place]) 08/13/2018  . Prediabetes 06/15/2018  . Palpitations 06/05/2018  . Essential hypertension 06/05/2018  . Hyperlipidemia LDL goal <70 06/05/2018  . Acute ischemic right MCA stroke ([place]) 06/05/2018  . Stroke (cerebrum) ([place]) - R MCA s/p tPA, embolic, source unknown 70/08/2589  . SVT (supraventricular tachycardia) ([place]) 05/28/2015  . PVC's  (premature ventricular contractions) 05/28/2015  . BENIGN POSITIONAL VERTIGO 06/16/2009  . ALLERGIC RHINITIS 06/16/2009    Electa Sniff, PT, DPT, NCS 03/17/2020, 1:04 PM  [facility] [address] [place], [place], [address] Phone: [phone]   Fax:  [phone]  Name: Annebelle Bostic MRN: [id number] Date of Birth: May 10, 1940

## 2020-03-17 NOTE — Therapy (Signed)
[facility] [address] [place], [place], [address] Phone: [phone]   Fax:  [phone]  Occupational Therapy Treatment  Patient Details  Name: Mary Mercado MRN: [id number] Date of Birth: [birth year] Referring Provider (OT): [name], MD   Encounter Date: 03/17/2020   OT End of Session - 03/17/20 1106    Visit Number 4    Number of Visits 17    Date for OT Re-Evaluation 04/15/20    Authorization Type Medicare Part A and B; BCBS supplement    Authorization Time Period VL:MN No Auth    Authorization - Visit Number 4    Authorization - Number of Visits 10    Progress Note Due on Visit 10    OT Start Time 1105    OT Stop Time 1145    OT Time Calculation (min) 40 min    Activity Tolerance Patient tolerated treatment well    Behavior During Therapy [facility] for tasks assessed/performed;Impulsive           Past Medical History:  Diagnosis Date  . Allergy   . CVA (cerebral vascular accident) ([place]) 05/2018, 01/09/20  . Hyperlipidemia LDL goal <70   . Hypertension   . Palpitations   . Vertigo, benign positional     Past Surgical History:  Procedure Laterality Date  . CARDIOVERSION Left 10/31/2018   Procedure: CARDIOVERSION;  Surgeon: Acie Fredrickson Wonda Cheng, MD;  Location: [facility];  Service: Cardiovascular;  Laterality: Left;  . CARDIOVERSION N/A 01/08/2020   Procedure: CARDIOVERSION;  Surgeon: [name], MD;  Location[facility] ENDOSCOPY;  Service: Cardiovascular;  Laterality: N/A;  . Hemilaminectomy and microdiskectomy at L4-5 on the left.  12/10/2003  . TEE WITHOUT CARDIOVERSION N/A 06/05/2018   Procedure: TRANSESOPHAGEAL ECHOCARDIOGRAM (TEE);  Surgeon: Lelon Perla, MD;  Location: [facility] ENDOSCOPY;  Service: Cardiovascular;  Laterality: N/A;  loop    There were no vitals filed for this visit.   Subjective Assessment - 03/17/20 1107    Subjective  Pt denies any pain. Pt reports tentative  plans for hospitalization for Tenosyn medication trial on Dec 6th.    Pertinent History PMH - CAD, hx of CVA, PAF s/p DCCV, BPPV, a-fib (currently undergoing testing to stop a-fib but was told not a candidate for ablation and might have to live with a-fib), HLD.    Limitations Fall Risk, No Driving. A-fib    Patient Stated Goals Pt states goals as "get back to feeling like my real self" and reports wanting to get back to "cooking and daily chores"    Currently in Pain? No/denies           Treatment:  Handwriting practice with writing name x 2 and tracing circles and uppercase alphabet. Encouraged one smooth movement for each circle and letter and not lifting pencil to encouraged fluid movement and dynamic movement of wrist and hand. Minimal deviation from line with <1/8" deviation.  Environmental Scanning in min - mod distracting environment with 87% accuracy (13/15)  Small pegboard design with LUE - moderately complex design - no drops and no difficulty with copying design.  Kitchen Mobility - Pt demonstrated mobility around kitchen. Pt abandoned rollator and ambulated around kitchen without device. No LOB or concerns for safety. Pt demonstrated good safety awareness in the kitchen with carrying items and mobility.                  OT Short Term Goals - 03/17/20 1128  OT SHORT TERM GOAL #1   Title Pt will be independent with HEP 03/18/2020    Time 4    Period Weeks    Status On-going    Target Date 03/18/20      OT SHORT TERM GOAL #2   Title Pt will demonstrate increased handwriting legibility of 3-5 sentences to 90%    Time 4    Period Weeks    Status On-going   slight micrographia noted     OT SHORT TERM GOAL #3   Title Pt will tolerate 10 minutes of standing in order to increase standing tolerance for preparation for cooking and home management.    Time 4    Period Weeks    Status On-going   completed home management in 10 minutes - SOB after activity       OT SHORT TERM GOAL #4   Title Pt will complete environmental scanning with 75% accuracy    Time 4    Period Weeks    Status Achieved   87%     OT SHORT TERM GOAL #5   Title Pt will complete simple warm meal prep and/or home management task with min A    Time 4    Period Weeks    Status On-going      OT SHORT TERM GOAL #6   Title Continue to assess vision and set goal PRN    Time 4    Period Weeks    Status On-going             OT Long Term Goals - 02/19/20 1613      OT LONG TERM GOAL #1   Title Pt will be independent with updated HEP 04/15/2020    Time 8    Period Weeks    Status New    Target Date 04/15/20      OT LONG TERM GOAL #2   Title Pt will improve coordination with LUE evidenced by completing 9 hole peg test in 35 seconds or less.    Baseline 42.06 seconds LUE    Time 8    Period Weeks    Status New      OT LONG TERM GOAL #3   Title Pt will complete simple warm meal prep and/or home management with supervision and good safety awareness    Time 8    Period Weeks    Status New      OT LONG TERM GOAL #4   Title Pt will complete environmental scanning with 90% accuracy    Time 8    Period Weeks    Status New      OT LONG TERM GOAL #5   Title Pt will tolerate 20 minutes of standing activities for increasing activity tolerance for increasing ability to complete home management activities and cooking.    Time 8    Period Weeks    Status New                 Plan - 03/17/20 1106    Clinical Impression Statement Pt continues to progress towards goals. Pt has met environmental scanning STG and is working towards increasing coordination and safety.    OT Occupational Profile and History Detailed Assessment- Review of Records and additional review of physical, cognitive, psychosocial history related to current functional performance    Occupational performance deficits (Please refer to evaluation for details): ADL's;IADL's;Leisure    Body Structure  / Function / Physical Skills ADL;UE functional use;FMC;Decreased knowledge  of use of DME;Mobility;Sensation;Vision;Strength;Endurance;Coordination;IADL;Tone;Cardiopulmonary status limiting activity;Balance;Dexterity;GMC    Cognitive Skills Attention;Safety Awareness;Thought;Perception;Understand    Rehab Potential Good    Clinical Decision Making Limited treatment options, no task modification necessary    Comorbidities Affecting Occupational Performance: May have comorbidities impacting occupational performance    Modification or Assistance to Complete Evaluation  No modification of tasks or assist necessary to complete eval    OT Frequency 2x / week    OT Duration 8 weeks    OT Treatment/Interventions Self-care/ADL training;Therapeutic exercise;Moist Heat;Neuromuscular education;Patient/family education;Visual/perceptual remediation/compensation;Therapist, nutritional;Therapeutic activities;DME and/or AE instruction;Manual Therapy;Cognitive remediation/compensation;Balance training;Energy conservation    Plan continue progressing towards goals.    Consulted and Agree with Plan of Care Patient;Family member/caregiver    Family Member Consulted spouse           Patient will benefit from skilled therapeutic intervention in order to improve the following deficits and impairments:   Body Structure / Function / Physical Skills: ADL, UE functional use, FMC, Decreased knowledge of use of DME, Mobility, Sensation, Vision, Strength, Endurance, Coordination, IADL, Tone, Cardiopulmonary status limiting activity, Balance, Dexterity, GMC Cognitive Skills: Attention, Safety Awareness, Thought, Perception, Understand     Visit Diagnosis: Other abnormalities of gait and mobility  Other lack of coordination  Unsteadiness on feet  Muscle weakness (generalized)  Visuospatial deficit  Frontal lobe and executive function deficit  Attention and concentration deficit    Problem List Patient  Active Problem List   Diagnosis Date Noted  . Hemiplegia and hemiparesis following cerebral infarction affecting left non-dominant side ([place]) 02/05/2020  . 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 CVA (cerebral vascular accident) ([place]) 01/09/2020  . Dizziness 11/30/2018  . Atrial fibrillation ([place]) 08/13/2018  . Prediabetes 06/15/2018  . Palpitations 06/05/2018  . Essential hypertension 06/05/2018  . Hyperlipidemia LDL goal <70 06/05/2018  . Acute ischemic right MCA stroke ([place]) 06/05/2018  . Stroke (cerebrum) ([place]) - R MCA s/p tPA, embolic, source unknown 96/78/9381  . SVT (supraventricular tachycardia) ([place]) 05/28/2015  . PVC's (premature ventricular contractions) 05/28/2015  . BENIGN POSITIONAL VERTIGO 06/16/2009  . ALLERGIC RHINITIS 06/16/2009    Zachery Conch  MOT, OTR/L  03/17/2020, 2:10 PM  [facility] [address] [place], [place], [address] Phone: [phone]   Fax:  [phone]  Name: Livian Vanderbeck MRN: [id number] Date of Birth: 09/26/1940

## 2020-03-17 NOTE — Addendum Note (Signed)
Encounter addended by: Sherran Needs, NP on: 03/17/2020 11:19 AM  Actions taken: Clinical Note Signed

## 2020-03-18 ENCOUNTER — Ambulatory Visit (INDEPENDENT_AMBULATORY_CARE_PROVIDER_SITE_OTHER): Payer: Medicare Other | Admitting: Internal Medicine

## 2020-03-18 ENCOUNTER — Encounter: Payer: Self-pay | Admitting: Internal Medicine

## 2020-03-18 ENCOUNTER — Other Ambulatory Visit: Payer: Self-pay

## 2020-03-18 ENCOUNTER — Encounter: Payer: Medicare Other | Admitting: Occupational Therapy

## 2020-03-18 VITALS — BP 112/76 | HR 107 | Ht 65.0 in | Wt 136.2 lb

## 2020-03-18 DIAGNOSIS — I4819 Other persistent atrial fibrillation: Secondary | ICD-10-CM | POA: Diagnosis not present

## 2020-03-18 DIAGNOSIS — I447 Left bundle-branch block, unspecified: Secondary | ICD-10-CM | POA: Diagnosis not present

## 2020-03-18 NOTE — H&P (View-Only) (Signed)
ELECTROPHYSIOLOGY CONSULT NOTE  Patient ID: Mary Mercado, MRN: [id number], DOB/AGE: May 06, 1940 79 y.o. Admit date: (Not on file) Date of Consult: 03/18/2020  Primary Physician: [name], MD Primary Cardiologist:  [name] is a 79 y.o. female who is being seen today for the evaluation of afib at the request of DCarroll ANP.    HPI [name] is a 79 y.o. female referred from the A. fib clinic in the wake of a stroke.  Had a stroke 2/20.  Monitor showed A. fib and she was anticoagulated.  Recurrent A. fib and successful cardioversion. 7/20 9/21 had recurrence of A. fib with a rapid rate.  Having missed no anticoagulation, she was started on flecainide, underwent cardioversion and during class Ic stress testing developed the symptoms of a stroke.  Eliquis was changed to Pradaxa  Thereafter it was elected to try maintain her on rate control with beta-blockers and calcium blockers.  This is been limited by hypotension.  10/21 seen in A. fib clinic found>>AFib rapid rates again.  Rate control efforts were augmented.  Without appreciable success.  Continues with significant fatigue and lassitude.  Some of this may be residual from her stroke.    DATE TEST EF   2/20 TEE  60-65 % LAE severe  9/21 Echo   60-65 % BAE severe (LA 31ml/m2)        Date Cr K Hgb  11/21 0.8 3.5 14.0        Thromboembolic risk factors ( age  -2, HTN-1, TIA/CVA-2, Gender-1) for a CHADSVASc Score of 6    Past Medical History:  Diagnosis Date  . Allergy   . CVA (cerebral vascular accident) ([place]) 05/2018, 01/09/20  . Hyperlipidemia LDL goal <70   . Hypertension   . Palpitations   . Vertigo, benign positional       Surgical History:  Past Surgical History:  Procedure Laterality Date  . CARDIOVERSION Left 10/31/2018   Procedure: CARDIOVERSION;  Surgeon: Acie Fredrickson Wonda Cheng, MD;  Location: [place];  Service: Cardiovascular;  Laterality: Left;   . CARDIOVERSION N/A 01/08/2020   Procedure: CARDIOVERSION;  Surgeon: [name], MD;  Location: [facility] ENDOSCOPY;  Service: Cardiovascular;  Laterality: N/A;  . Hemilaminectomy and microdiskectomy at L4-5 on the left.  12/10/2003  . TEE WITHOUT CARDIOVERSION N/A 06/05/2018   Procedure: TRANSESOPHAGEAL ECHOCARDIOGRAM (TEE);  Surgeon: Lelon Perla, MD;  Location[facility] ENDOSCOPY;  Service: Cardiovascular;  Laterality: N/A;  loop     Home Meds: Current Meds  Medication Sig  . atorvastatin (LIPITOR) 80 MG tablet TAKE 1 TABLET (80 MG TOTAL) BY MOUTH DAILY AT 6 PM.  . diltiazem (CARDIZEM CD) 120 MG 24 hr capsule Take 1 capsule (120 mg total) by mouth at bedtime.  Marland Kitchen diltiazem (CARDIZEM CD) 240 MG 24 hr capsule Take 1 capsule (240 mg total) by mouth daily.  Marland Kitchen diltiazem (CARDIZEM) 30 MG tablet Take 1 tablet (30 mg total) by mouth 4 (four) times daily as needed. Prn  . KLOR-CON M10 10 MEQ tablet TAKE 1 TABLET BY MOUTH EVERY DAY  . metoprolol tartrate (LOPRESSOR) 25 MG tablet Take 0.5 tablets (12.5 mg total) by mouth 2 (two) times daily.  Marland Kitchen PRADAXA 150 MG CAPS capsule TAKE 1 CAPSULE (150 MG TOTAL) BY MOUTH EVERY 12 (TWELVE) HOURS.    Allergies:  Allergies  Allergen Reactions  . Duloxetine     Causes Bp elevation.  Increased body jerks, nausea  .  Acetaminophen Hives and Rash    Social History   Socioeconomic History  . Marital status: Married    Spouse name: [name]  . Number of children: Not on file  . Years of education: 12+  . Highest education level: Not on file  Occupational History    Comment: retired  Tobacco Use  . Smoking status: Never Smoker  . Smokeless tobacco: Never Used  Vaping Use  . Vaping Use: Never used  Substance and Sexual Activity  . Alcohol use: Never  . Drug use: Never  . Sexual activity: Not on file  Other Topics Concern  . Not on file  Social History Narrative   Lives with husband   caffeine none now   Social Determinants of Health   Financial  Resource Strain:   . Difficulty of Paying Living Expenses: Not on file  Food Insecurity:   . Worried About Charity fundraiser in the Last Year: Not on file  . Ran Out of Food in the Last Year: Not on file  Transportation Needs:   . Lack of Transportation (Medical): Not on file  . Lack of Transportation (Non-Medical): Not on file  Physical Activity:   . Days of Exercise per Week: Not on file  . Minutes of Exercise per Session: Not on file  Stress:   . Feeling of Stress : Not on file  Social Connections:   . Frequency of Communication with Friends and Family: Not on file  . Frequency of Social Gatherings with Friends and Family: Not on file  . Attends Religious Services: Not on file  . Active Member of Clubs or Organizations: Not on file  . Attends Archivist Meetings: Not on file  . Marital Status: Not on file  Intimate Partner Violence:   . Fear of Current or Ex-Partner: Not on file  . Emotionally Abused: Not on file  . Physically Abused: Not on file  . Sexually Abused: Not on file     Family History  Problem Relation Age of Onset  . Colon cancer Father   . Diabetes Mother   . Heart attack Mother        16  . Heart attack Brother 52     ROS:  Please see the history of present illness.    all other systems reviewed and negative.    Physical Exam  Blood pressure 112/76, pulse (!) 107, height 5\' 5"  (1.651 m), weight 136 lb 3.2 oz (61.8 kg), SpO2 94 %. General: Well developed, well nourished female in no acute distress. Head: Normocephalic, atraumatic, sclera non-icteric, no xanthomas, nares are without discharge. EENT: normal  Lymph Nodes:  none Neck: Negative for carotid bruits. JVD not elevated. Back:without scoliosis kyphosis  Lungs: Clear bilaterally to auscultation without wheezes, rales, or rhonchi. Breathing is unlabored. Heart: Irregular RR with S1 S2. No  murmur . No rubs, or gallops appreciated. Abdomen: Soft, non-tender, non-distended with  normoactive bowel sounds. No hepatomegaly. No rebound/guarding. No obvious abdominal masses. Msk:  Strength and tone appear normal for age. Extremities: No clubbing or cyanosis. No edema.  Distal pedal pulses are 2+ and equal bilaterally. Skin: Warm and Dry Neuro: Alert and oriented X 3. CN III-XII intact Grossly normal sensory and motor function .walks with walker  Psych:  Responds to questions appropriately with a normal affect.      Labs: Cardiac Enzymes No results for input(s): CKTOTAL, CKMB, TROPONINI in the last 72 hours. CBC Lab Results  Component Value Date  WBC 5.4 01/20/2020   HGB 14.0 01/20/2020   HCT 43.7 01/20/2020   MCV 92.4 01/20/2020   PLT 247 01/20/2020   PROTIME: No results for input(s): LABPROT, INR in the last 72 hours. Chemistry  Recent Labs  Lab 03/16/20 1525  NA 138  K 3.5  CL 102  CO2 23  BUN 12  CREATININE 0.80  CALCIUM 9.0  GLUCOSE 129*   Lipids Lab Results  Component Value Date   CHOL 183 01/10/2020   HDL 92 01/10/2020   LDLCALC 81 01/10/2020   TRIG 52 01/10/2020   BNP No results found for: PROBNP Thyroid Function Tests: No results for input(s): TSH, T4TOTAL, T3FREE, THYROIDAB in the last 72 hours.  Invalid input(s): FREET3 Miscellaneous No results found for: DDIMER  Radiology/Studies:  No results found.  Marthann Schiller: afib @ 107 -/08/29   Assessment and Plan:   Atrial fibrillation persistent   CVA  Low BP   The patient has persistent atrial fibrillation in the context of severe biatrial enlargement. The likelihood of being able to retain sinus rhythm even with antiarrhythmic therapy is relatively low. Given the concerns related to the stroke and the cardioversion, in this context, I think pursuing an antiarrhythmic therapy option with the need likely for repeated cardioversions is not likely to be successful. The family would like to pursue an alternative option which we discussed which was AV junction ablation and pacing. This  would obviate the need for rate control. It does not impact the atrial fibrillation itself which they understand nor the need for anticoagulation.  The benefits and risks were reviewed including but not limited to death,  perforation, infection, lead dislodgement and device malfunction.  The patient understands agrees and is willing to proceed.  anticipate LBB area pacing and overnight stay         Virl Axe

## 2020-03-18 NOTE — Progress Notes (Signed)
ELECTROPHYSIOLOGY CONSULT NOTE  Patient ID: Mary Mercado, MRN: [id number], DOB/AGE: 1940/04/27 79 y.o. Admit date: (Not on file) Date of Consult: 03/18/2020  Primary Physician: Mary Morale, MD Primary Cardiologist:  Mary Mercado is a 79 y.o. female who is being seen today for the evaluation of afib at the request of DCarroll ANP.    HPI Mary Mercado is a 79 y.o. female referred from the A. fib clinic in the wake of a stroke.  Had a stroke 2/20.  Monitor showed A. fib and she was anticoagulated.  Recurrent A. fib and successful cardioversion. 7/20 9/21 had recurrence of A. fib with a rapid rate.  Having missed no anticoagulation, she was started on flecainide, underwent cardioversion and during class Ic stress testing developed the symptoms of a stroke.  Eliquis was changed to Pradaxa  Thereafter it was elected to try maintain her on rate control with beta-blockers and calcium blockers.  This is been limited by hypotension.  10/21 seen in A. fib clinic found>>AFib rapid rates again.  Rate control efforts were augmented.  Without appreciable success.  Continues with significant fatigue and lassitude.  Some of this may be residual from her stroke.    DATE TEST EF   2/20 TEE  60-65 % LAE severe  9/21 Echo   60-65 % BAE severe (LA 75ml/m2)        Date Cr K Hgb  11/21 0.8 3.5 14.0        Thromboembolic risk factors ( age  -2, HTN-1, TIA/CVA-2, Gender-1) for a CHADSVASc Score of 6    Past Medical History:  Diagnosis Date  . Allergy   . CVA (cerebral vascular accident) ([place]) 05/2018, 01/09/20  . Hyperlipidemia LDL goal <70   . Hypertension   . Palpitations   . Vertigo, benign positional       Surgical History:  Past Surgical History:  Procedure Laterality Date  . CARDIOVERSION Left 10/31/2018   Procedure: CARDIOVERSION;  Surgeon: Acie Fredrickson Wonda Cheng, MD;  Location: [place];  Service: Cardiovascular;  Laterality: Left;   . CARDIOVERSION N/A 01/08/2020   Procedure: CARDIOVERSION;  Surgeon: [name], MD;  Location: [facility] ENDOSCOPY;  Service: Cardiovascular;  Laterality: N/A;  . Hemilaminectomy and microdiskectomy at L4-5 on the left.  12/10/2003  . TEE WITHOUT CARDIOVERSION N/A 06/05/2018   Procedure: TRANSESOPHAGEAL ECHOCARDIOGRAM (TEE);  Surgeon: Lelon Perla, MD;  Location: [facility] ENDOSCOPY;  Service: Cardiovascular;  Laterality: N/A;  loop     Home Meds: Current Meds  Medication Sig  . atorvastatin (LIPITOR) 80 MG tablet TAKE 1 TABLET (80 MG TOTAL) BY MOUTH DAILY AT 6 PM.  . diltiazem (CARDIZEM CD) 120 MG 24 hr capsule Take 1 capsule (120 mg total) by mouth at bedtime.  Marland Kitchen diltiazem (CARDIZEM CD) 240 MG 24 hr capsule Take 1 capsule (240 mg total) by mouth daily.  Marland Kitchen diltiazem (CARDIZEM) 30 MG tablet Take 1 tablet (30 mg total) by mouth 4 (four) times daily as needed. Prn  . KLOR-CON M10 10 MEQ tablet TAKE 1 TABLET BY MOUTH EVERY DAY  . metoprolol tartrate (LOPRESSOR) 25 MG tablet Take 0.5 tablets (12.5 mg total) by mouth 2 (two) times daily.  Marland Kitchen PRADAXA 150 MG CAPS capsule TAKE 1 CAPSULE (150 MG TOTAL) BY MOUTH EVERY 12 (TWELVE) HOURS.    Allergies:  Allergies  Allergen Reactions  . Duloxetine     Causes Bp elevation.  Increased body jerks, nausea  .  Acetaminophen Hives and Rash    Social History   Socioeconomic History  . Marital status: Married    Spouse name: [name]  . Number of children: Not on file  . Years of education: 12+  . Highest education level: Not on file  Occupational History    Comment: retired  Tobacco Use  . Smoking status: Never Smoker  . Smokeless tobacco: Never Used  Vaping Use  . Vaping Use: Never used  Substance and Sexual Activity  . Alcohol use: Never  . Drug use: Never  . Sexual activity: Not on file  Other Topics Concern  . Not on file  Social History Narrative   Lives with husband   caffeine none now   Social Determinants of Health   Financial  Resource Strain:   . Difficulty of Paying Living Expenses: Not on file  Food Insecurity:   . Worried About Charity fundraiser in the Last Year: Not on file  . Ran Out of Food in the Last Year: Not on file  Transportation Needs:   . Lack of Transportation (Medical): Not on file  . Lack of Transportation (Non-Medical): Not on file  Physical Activity:   . Days of Exercise per Week: Not on file  . Minutes of Exercise per Session: Not on file  Stress:   . Feeling of Stress : Not on file  Social Connections:   . Frequency of Communication with Friends and Family: Not on file  . Frequency of Social Gatherings with Friends and Family: Not on file  . Attends Religious Services: Not on file  . Active Member of Clubs or Organizations: Not on file  . Attends Archivist Meetings: Not on file  . Marital Status: Not on file  Intimate Partner Violence:   . Fear of Current or Ex-Partner: Not on file  . Emotionally Abused: Not on file  . Physically Abused: Not on file  . Sexually Abused: Not on file     Family History  Problem Relation Age of Onset  . Colon cancer Father   . Diabetes Mother   . Heart attack Mother        2  . Heart attack Brother 59     ROS:  Please see the history of present illness.    all other systems reviewed and negative.    Physical Exam  Blood pressure 112/76, pulse (!) 107, height 5\' 5"  (1.651 m), weight 136 lb 3.2 oz (61.8 kg), SpO2 94 %. General: Well developed, well nourished female in no acute distress. Head: Normocephalic, atraumatic, sclera non-icteric, no xanthomas, nares are without discharge. EENT: normal  Lymph Nodes:  none Neck: Negative for carotid bruits. JVD not elevated. Back:without scoliosis kyphosis  Lungs: Clear bilaterally to auscultation without wheezes, rales, or rhonchi. Breathing is unlabored. Heart: Irregular RR with S1 S2. No  murmur . No rubs, or gallops appreciated. Abdomen: Soft, non-tender, non-distended with  normoactive bowel sounds. No hepatomegaly. No rebound/guarding. No obvious abdominal masses. Msk:  Strength and tone appear normal for age. Extremities: No clubbing or cyanosis. No edema.  Distal pedal pulses are 2+ and equal bilaterally. Skin: Warm and Dry Neuro: Alert and oriented X 3. CN III-XII intact Grossly normal sensory and motor function .walks with walker  Psych:  Responds to questions appropriately with a normal affect.      Labs: Cardiac Enzymes No results for input(s): CKTOTAL, CKMB, TROPONINI in the last 72 hours. CBC Lab Results  Component Value Date  WBC 5.4 01/20/2020   HGB 14.0 01/20/2020   HCT 43.7 01/20/2020   MCV 92.4 01/20/2020   PLT 247 01/20/2020   PROTIME: No results for input(s): LABPROT, INR in the last 72 hours. Chemistry  Recent Labs  Lab 03/16/20 1525  NA 138  K 3.5  CL 102  CO2 23  BUN 12  CREATININE 0.80  CALCIUM 9.0  GLUCOSE 129*   Lipids Lab Results  Component Value Date   CHOL 183 01/10/2020   HDL 92 01/10/2020   LDLCALC 81 01/10/2020   TRIG 52 01/10/2020   BNP No results found for: PROBNP Thyroid Function Tests: No results for input(s): TSH, T4TOTAL, T3FREE, THYROIDAB in the last 72 hours.  Invalid input(s): FREET3 Miscellaneous No results found for: DDIMER  Radiology/Studies:  No results found.  Marthann Schiller: afib @ 107 -/08/29   Assessment and Plan:   Atrial fibrillation persistent   CVA  Low BP   The patient has persistent atrial fibrillation in the context of severe biatrial enlargement. The likelihood of being able to retain sinus rhythm even with antiarrhythmic therapy is relatively low. Given the concerns related to the stroke and the cardioversion, in this context, I think pursuing an antiarrhythmic therapy option with the need likely for repeated cardioversions is not likely to be successful. The family would like to pursue an alternative option which we discussed which was AV junction ablation and pacing. This  would obviate the need for rate control. It does not impact the atrial fibrillation itself which they understand nor the need for anticoagulation.  The benefits and risks were reviewed including but not limited to death,  perforation, infection, lead dislodgement and device malfunction.  The patient understands agrees and is willing to proceed.  anticipate LBB area pacing and overnight stay         Virl Axe

## 2020-03-18 NOTE — Patient Instructions (Addendum)
Medication Instructions:  Your physician recommends that you continue on your current medications as directed. Please refer to the Current Medication list given to you today.  *If you need a refill on your cardiac medications before your next appointment, please call your pharmacy*   Lab Work: TODAY: CBC/BMET  If you have labs (blood work) drawn today and your tests are completely normal, you will receive your results only by: Marland Kitchen MyChart Message (if you have MyChart) OR . A paper copy in the mail If you have any lab test that is abnormal or we need to change your treatment, we will call you to review the results.   Testing/Procedures: Your physician has recommended that you have a pacemaker inserted. A pacemaker is a small device that is placed under the skin of your chest or abdomen to help control abnormal heart rhythms. This device uses electrical pulses to prompt the heart to beat at a normal rate. Pacemakers are used to treat heart rhythms that are too slow. Wire (leads) are attached to the pacemaker that goes into the chambers of you heart. This is done in the hospital and usually requires and overnight stay. Please see the instruction sheet given to you today for more information.  Your physician has recommended that you have an ablation. Catheter ablation is a medical procedure used to treat some cardiac arrhythmias (irregular heartbeats). During catheter ablation, a long, thin, flexible tube is put into a blood vessel in your groin (upper thigh), or neck. This tube is called an ablation catheter. It is then guided to your heart through the blood vessel. Radio frequency waves destroy small areas of heart tissue where abnormal heartbeats may cause an arrhythmia to start. Please see the instruction sheet given to you today.   Follow-Up: You will need a wound check 10-14 days after your procedure  You will need to follow up with Dr. Caryl Comes 4 weeks after your procedure  You will need to  follow up with Dr. Caryl Comes 3 months after your procedure  Other Instructions  You are scheduled for a(n) AV node ablation and Pacemaker Insertion on 03/30/20 with Dr. Caryl Comes.   1.   Pre procedure testing-             A.  TODAY: CBC/BMET               B. COVID TEST-- On 03/28/20 at 10:20 AM - This is a Drive Up Visit at [address] [place], [place] [address].  Someone will direct you to the appropriate testing line. Stay in your car and someone will be with you shortly.   After you are tested please go home and self quarantine until the day of your procedure.     2. On the day of your procedure 03/30/20 you will go to [facility] [phone]) at 5:30 AM.  Dennis Bast will go to the main entrance A [employer]) and enter where the [employer] are.  Your driver will drop you off and you will head down the hallway to ADMITTING.  You may have one support person come in to the hospital with you.  They will be asked to wait in the waiting room.   3.   Do not eat or drink after midnight prior to your procedure.   4.   DO NOT take pradaxa the night before and the morning of your procedure   5.  Plan for an overnight stay, but you may be  discharged home after your procedure.    If you use your phone frequently bring your phone charger, in case you have to stay.  If you are discharged after your procedure you will need someone to drive you home and be with your for 24 hours after your procedure.

## 2020-03-19 LAB — BASIC METABOLIC PANEL
BUN/Creatinine Ratio: 15 (ref 12–28)
BUN: 11 mg/dL (ref 8–27)
CO2: 26 mmol/L (ref 20–29)
Calcium: 9.1 mg/dL (ref 8.7–10.3)
Chloride: 101 mmol/L (ref 96–106)
Creatinine, Ser: 0.75 mg/dL (ref 0.57–1.00)
GFR calc Af Amer: 87 mL/min/{1.73_m2} (ref >59)
GFR calc non Af Amer: 76 mL/min/{1.73_m2} (ref >59)
Glucose: 94 mg/dL (ref 65–99)
Potassium: 4.4 mmol/L (ref 3.5–5.2)
Sodium: 140 mmol/L (ref 134–144)

## 2020-03-19 LAB — CBC
Hematocrit: 44.6 % (ref 34.0–46.6)
Hemoglobin: 15.1 g/dL (ref 11.1–15.9)
MCH: 30.5 pg (ref 26.6–33.0)
MCHC: 33.9 g/dL (ref 31.5–35.7)
MCV: 90 fL (ref 79–97)
Platelets: 280 10*3/uL (ref 150–450)
RBC: 4.95 x10E6/uL (ref 3.77–5.28)
RDW: 12.4 % (ref 11.7–15.4)
WBC: 7.8 10*3/uL (ref 3.4–10.8)

## 2020-03-23 ENCOUNTER — Telehealth: Payer: Self-pay | Admitting: Internal Medicine

## 2020-03-23 NOTE — Telephone Encounter (Signed)
    Pt would like to speak with Dr. Olin Pia nurse about her upcoming procedure, she said she has some questions about medications

## 2020-03-23 NOTE — Telephone Encounter (Signed)
Spoke with pt who confirms she is not to take Pradaxa the night before or morning of procedure.  Pt advised she may take her 8pm dose of Diltiazem the night prior to procedure. Pt confirmed the date and time of her Covid testing and thanked Therapist, sports for call.

## 2020-03-24 ENCOUNTER — Ambulatory Visit: Payer: Medicare Other

## 2020-03-24 ENCOUNTER — Other Ambulatory Visit: Payer: Self-pay

## 2020-03-24 ENCOUNTER — Ambulatory Visit: Payer: Medicare Other | Admitting: Occupational Therapy

## 2020-03-24 ENCOUNTER — Encounter: Payer: Medicare Other | Attending: Physical Medicine & Rehabilitation | Admitting: Physical Medicine & Rehabilitation

## 2020-03-24 ENCOUNTER — Encounter: Payer: Self-pay | Admitting: Physical Medicine & Rehabilitation

## 2020-03-24 VITALS — BP 107/61 | HR 88 | Temp 97.9°F | Ht 65.0 in | Wt 134.0 lb

## 2020-03-24 DIAGNOSIS — R269 Unspecified abnormalities of gait and mobility: Secondary | ICD-10-CM

## 2020-03-24 DIAGNOSIS — I63511 Cerebral infarction due to unspecified occlusion or stenosis of right middle cerebral artery: Secondary | ICD-10-CM | POA: Diagnosis not present

## 2020-03-24 DIAGNOSIS — I69398 Other sequelae of cerebral infarction: Secondary | ICD-10-CM

## 2020-03-24 NOTE — Patient Instructions (Signed)
Please keep up with home exercise  Dr Caryl Comes will have to approve the restart of therapy after pacemaker placement

## 2020-03-24 NOTE — Progress Notes (Signed)
Subjective:    Patient ID: Mary Mercado, female    DOB: 04/03/1941, 79 y.o.   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  79 y.o. Rh-female with history of CAD, right-CVA, PAF s/p DCCV recently; who was admitted on 01/09/2020 with acute onset of dizziness, slurred speech and nausea and vomiting while at cardiologist office.  CTA head/neck showed moderate to severe stenosis left HP and moderate stenosis right P2 segment.  MRI brain showed acute 3 mm infarct in right-ACA and evolution of prior right-MCA infarct.  2D echo showed EF 60 to 65% with biatrial dilatation, moderate PAH and mild to moderate aortic valve sclerosis.  Stroke was felt to be embolic due to A. fib and Eliquis was changed to Pradaxa per Dr. Clydene Fake input.  Therapy evaluations done revealing mild dysarthria with mild memory deficits, vestibular symptoms as well as balance deficits affecting ADLs and mobility.  CIR was recommended due to functional decline. She has had improvement in functional use RUE  and RLE as well as improvement in awareness. She is able to complete ADL tasks at modified independent level.  She requires supervision with mobility and Berg balance score has improved to 48/56.  She is able to use speech strategies at modified independent level and speech intelligibility has improved.  Admit date: 01/14/2020 Discharge date: 01/22/2020 HPI Interval history significant for continued problems with complaints of lassitude, shortness of breath.  Has been evaluated by cardiology, there is biatrial enlargement as well as difficulty controlling ventricular rate with chronic atrial fibrillation.  Remains on anticoagulation.  Scheduled for AV node ablation as well as pacemaker placement. Uses rollator outside the home Performing Home exercises on a regular basis.  Has had OP PT, OT, SLP Independent with all self-care and mobility  Pain Inventory Average Pain 0 Pain  Right Now 0 My pain is no pain  In the last 24 hours, has pain interfered with the following? General activity 0 Relation with others 0 Enjoyment of life 0 What TIME of day is your pain at its worst? No pain  Sleep (in general) Good  Pain is worse with: no pain Pain improves with: no pain Relief from Meds: no pain  Family History  Problem Relation Age of Onset  . Colon cancer Father   . Diabetes Mother   . Heart attack Mother        36  . Heart attack Brother 70   Social History   Socioeconomic History  . Marital status: Married    Spouse name: [name]  . Number of children: Not on file  . Years of education: 12+  . Highest education level: Not on file  Occupational History    Comment: retired  Tobacco Use  . Smoking status: Never Smoker  . Smokeless tobacco: Never Used  Vaping Use  . Vaping Use: Never used  Substance and Sexual Activity  . Alcohol use: Never  . Drug use: Never  . Sexual activity: Not on file  Other Topics Concern  . Not on file  Social History Narrative   Lives with husband   caffeine none now   Social Determinants of Health   Financial Resource Strain:   . Difficulty of Paying Living Expenses: Not on file  Food Insecurity:   . Worried About Charity fundraiser in the Last Year: Not on file  . Ran Out of Food in the Last Year: Not on file  Transportation Needs:   .  Lack of Transportation (Medical): Not on file  . Lack of Transportation (Non-Medical): Not on file  Physical Activity:   . Days of Exercise per Week: Not on file  . Minutes of Exercise per Session: Not on file  Stress:   . Feeling of Stress : Not on file  Social Connections:   . Frequency of Communication with Friends and Family: Not on file  . Frequency of Social Gatherings with Friends and Family: Not on file  . Attends Religious Services: Not on file  . Active Member of Clubs or Organizations: Not on file  . Attends Archivist Meetings: Not on file  .  Marital Status: Not on file   Past Surgical History:  Procedure Laterality Date  . CARDIOVERSION Left 10/31/2018   Procedure: CARDIOVERSION;  Surgeon: Acie Fredrickson Wonda Cheng, MD;  Location: [place];  Service: Cardiovascular;  Laterality: Left;  . CARDIOVERSION N/A 01/08/2020   Procedure: CARDIOVERSION;  Surgeon: [name], MD;  Location: [facility] ENDOSCOPY;  Service: Cardiovascular;  Laterality: N/A;  . Hemilaminectomy and microdiskectomy at L4-5 on the left.  12/10/2003  . TEE WITHOUT CARDIOVERSION N/A 06/05/2018   Procedure: TRANSESOPHAGEAL ECHOCARDIOGRAM (TEE);  Surgeon: Lelon Perla, MD;  Location: [facility] ENDOSCOPY;  Service: Cardiovascular;  Laterality: N/A;  loop   Past Surgical History:  Procedure Laterality Date  . CARDIOVERSION Left 10/31/2018   Procedure: CARDIOVERSION;  Surgeon: Acie Fredrickson Wonda Cheng, MD;  Location: [place];  Service: Cardiovascular;  Laterality: Left;  . CARDIOVERSION N/A 01/08/2020   Procedure: CARDIOVERSION;  Surgeon: [name], MD;  Location: [facility] ENDOSCOPY;  Service: Cardiovascular;  Laterality: N/A;  . Hemilaminectomy and microdiskectomy at L4-5 on the left.  12/10/2003  . TEE WITHOUT CARDIOVERSION N/A 06/05/2018   Procedure: TRANSESOPHAGEAL ECHOCARDIOGRAM (TEE);  Surgeon: Lelon Perla, MD;  Location: [facility] ENDOSCOPY;  Service: Cardiovascular;  Laterality: N/A;  loop   Past Medical History:  Diagnosis Date  . Allergy   . CVA (cerebral vascular accident) ([place]) 05/2018, 01/09/20  . Hyperlipidemia LDL goal <70   . Hypertension   . Palpitations   . Vertigo, benign positional    BP 107/61   Pulse 88   Temp 97.9 F (36.6 C)   Ht 5\' 5"  (1.651 m)   Wt 134 lb (60.8 kg)   SpO2 97%   BMI 22.30 kg/m   Opioid Risk Score:   Fall Risk Score:  `1  Depression screen PHQ 2/9  Depression screen [facility] 2/9 02/07/2020 09/20/2018 06/14/2017  Decreased Interest 2 0 0  Down, Depressed, Hopeless 0 1 0  PHQ - 2 Score 2 1 0  Altered sleeping 1 - -  Tired,  decreased energy 0 - -  Change in appetite 0 - -  Feeling bad or failure about yourself  0 - -  Trouble concentrating 0 - -  Moving slowly or fidgety/restless 2 - -  Suicidal thoughts 0 - -  PHQ-9 Score 5 - -    Review of Systems  Constitutional: Negative.   HENT: Negative.   Eyes: Negative.   Respiratory: Negative.   Cardiovascular: Negative.   Gastrointestinal: Negative.   Endocrine: Negative.   Genitourinary: Negative.   Musculoskeletal: Positive for gait problem.  Skin: Negative.   Allergic/Immunologic: Negative.   Hematological: Negative.   Psychiatric/Behavioral: Negative.   All other systems reviewed and are negative.      Objective:   Physical Exam Vitals and nursing note reviewed.  Constitutional:      Appearance: She is normal  weight.  Eyes:     Extraocular Movements: Extraocular movements intact.     Pupils: Pupils are equal, round, and reactive to light.  Neurological:     Mental Status: She is alert and oriented to person, place, and time.     Comments: Motor strength is 5/5 bilateral deltoid bicep tricep grip hip flexor knee extensors ankle dorsiflexors Sensation intact light touch bilateral upper and lower limbs No evidence of dysdiadochokinesis with rapid alternating supination pronation of bilateral fine finger thumb opposition intact bilaterally Ambulates without assistive device with supervision she is able to perform toe walk as well as heel walk as well as tandem gait.  Psychiatric:        Mood and Affect: Mood normal.        Behavior: Behavior normal.           Assessment & Plan:  #1.  Right superior cerebellar artery infarct approximately 2 months ago.  She is making excellent improvements.  Still has some mild balance issues however her tandem gait has significantly improved.  2.  History of right MCA infarct no significant residual at this time  3.  Atrial fibrillation with difficult rate control.  Has soft blood pressures so that  medication management is difficult.  She is scheduled for AV node ablation as well as pacemaker placement with Dr. Caryl Comes.  I discussed that she will have some restrictions in terms of her left upper extremity post procedure and that she will need to get clearance from Dr. Caryl Comes to resume outpatient therapy.  Also would need Dr. Olin Pia clearance for driving.

## 2020-03-26 ENCOUNTER — Ambulatory Visit: Payer: Medicare Other | Admitting: Occupational Therapy

## 2020-03-26 ENCOUNTER — Ambulatory Visit: Payer: Medicare Other

## 2020-03-26 ENCOUNTER — Telehealth: Payer: Self-pay | Admitting: Neurology

## 2020-03-26 NOTE — Telephone Encounter (Signed)
I called pt. I advised pt that Dr. Rexene Alberts reviewed their sleep study results and found that has severe sleep apnea and recommends that pt be treated with a cpap. Dr. Rexene Alberts recommends that pt return for a repeat sleep study in order to properly titrate the cpap and ensure a good mask fit. Pt is agreeable to returning for a titration study. I advised pt that our sleep lab will file with pt's insurance and call pt to schedule the sleep study when we hear back from the pt's insurance regarding coverage of this sleep study. Pt verbalized understanding of results. Pt had no questions at this time but was encouraged to call back if questions arise.

## 2020-03-26 NOTE — Telephone Encounter (Signed)
-----   Message from Star Age, MD sent at 03/26/2020  1:05 PM EST ----- Patient referred by Dr. Leonie Man and Janett Billow, seen by me on 02/27/20, diagnostic PSG on 03/12/20.   Please call and notify the patient that the recent sleep study showed severe obstructive sleep apnea. I recommend treatment for this in the form of CPAP. This will require a repeat sleep study for proper titration and mask fitting and correct monitoring of the oxygen saturations. Please explain to patient. I have placed an order in the chart. Thanks.  Star Age, MD, PhD [facility])

## 2020-03-26 NOTE — Addendum Note (Signed)
Addended by: Star Age on: 03/26/2020 01:05 PM   Modules accepted: Orders

## 2020-03-26 NOTE — Progress Notes (Signed)
Patient referred by Dr. Leonie Man and Janett Billow, seen by me on 02/27/20, diagnostic PSG on 03/12/20.   Please call and notify the patient that the recent sleep study showed severe obstructive sleep apnea. I recommend treatment for this in the form of CPAP. This will require a repeat sleep study for proper titration and mask fitting and correct monitoring of the oxygen saturations. Please explain to patient. I have placed an order in the chart. Thanks.  Star Age, MD, PhD [facility])

## 2020-03-26 NOTE — Procedures (Signed)
PATIENT'S NAME:  Mary Mercado, Mary Mercado DOB:      03/12/2021      MR#:    [id number]     DATE OF RECORDING: 03/12/2020 REFERRING M.D.:  Dr. Daryl Eastern Performed:   Baseline Polysomnogram HISTORY: 79 year old woman with a history of right MCA stroke in 2020, recent right cerebellar stroke in September 2021, vertigo, palpitations, hypertension, hyperlipidemia, allergies, and A. fib, with status post cardioversion twice, who reports snoring and some daytime somnolence.  The patient endorsed the Epworth Sleepiness Scale at 7 points. The patient's weight 137 pounds with a height of 65 (inches), resulting in a BMI of 22.8 kg/m2. The patient's neck circumference measured 12.8 inches.  CURRENT MEDICATIONS: Lipitor, Cardizem CD, Klor-Con, Lopressor, PradaxaLexiscan, TC-Myoview   PROCEDURE:  This is a multichannel digital polysomnogram utilizing the Somnostar 11.2 system.  Electrodes and sensors were applied and monitored per AASM Specifications.   EEG, EOG, Chin and Limb EMG, were sampled at 200 Hz.  ECG, Snore and Nasal Pressure, Thermal Airflow, Respiratory Effort, CPAP Flow and Pressure, Oximetry was sampled at 50 Hz. Digital video and audio were recorded.      BASELINE STUDY  Lights Out was at 21:37 and Lights On at 04:54.  Total recording time (TRT) was 437 minutes, with a total sleep time (TST) of 312 minutes.   The patient's sleep latency was 34.5 minutes.  REM latency was 73.5 minutes, which is normal. The sleep efficiency was 71.4 %.     SLEEP ARCHITECTURE: WASO (Wake after sleep onset) was 95.5 minutes with mild to moderate sleep fragmentation noted. There were 21 minutes in Stage N1, 255 minutes Stage N2, 23.5 minutes Stage N3 and 12.5 minutes in Stage REM.  The percentage of Stage N1 was 6.7%, Stage N2 was 81.7%, which is markedly increased, Stage N3 was 7.5% and Stage R (REM sleep) was 4.%, which is markedly reduced. The arousals were noted as: 20 were spontaneous, 0 were associated with PLMs, 110  were associated with respiratory events.  RESPIRATORY ANALYSIS:  There were a total of 232 respiratory events:  48 obstructive apneas, 61 central apneas and 11 mixed apneas with a total of 120 apneas and an apnea index (AI) of 23.1 /hour. There were 112 hypopneas with a hypopnea index of 21.5 /hour. The patient also had 0 respiratory event related arousals (RERAs).      The total APNEA/HYPOPNEA INDEX (AHI) was 44.6/hour and the total RESPIRATORY DISTURBANCE INDEX was  44.6 /hour.  11 events occurred in REM sleep and 283 events in NREM. The REM AHI was  52.8 /hour, versus a non-REM AHI of 44.3. The patient spent 312 minutes of total sleep time in the supine position and 0 minutes in non-supine.. The supine AHI was 44.6 versus a non-supine AHI of n/a.  OXYGEN SATURATION & C02:  The Wake baseline 02 saturation was 92%, with the lowest being 86%. Time spent below 89% saturation equaled 2 minutes.  PERIODIC LIMB MOVEMENTS: The patient had a total of 0 Periodic Limb Movements.  The Periodic Limb Movement (PLM) index was 0 and the PLM Arousal index was 0/hour.  Audio and video analysis did not show any abnormal or unusual movements, behaviors, phonations or vocalizations. The patient took 2 bathroom breaks. Mild to moderate snoring was noted. The EKG was irregular, in keeping with atrial fibrillation with occasional PVCs.   Post-study, the patient indicated that sleep was the same as usual.   IMPRESSION:  1. Obstructive Sleep Apnea (OSA) 2. Dysfunctions associated with  sleep stages or arousal from sleep 3. Non-specific abnormal EKG  RECOMMENDATIONS:  1. This study demonstrates severe obstructive sleep apnea, with a total AHI of 44.6/hour, REM AHI of 52.8/hour, supine AHI of 44.6/hour and O2 nadir of 86%. Treatment with positive airway pressure in the form of CPAP is recommended. This will require a full night titration study to optimize therapy. Other treatment options may include avoidance of supine  sleep position along with weight loss, upper airway or jaw surgery in selected patients or the use of an oral appliance in certain patients. ENT evaluation and/or consultation with a maxillofacial surgeon or dentist may be feasible in some instances.    2. Please note that untreated obstructive sleep apnea may carry additional perioperative morbidity. Patients with significant obstructive sleep apnea should receive perioperative PAP therapy and the surgeons and particularly the anesthesiologist should be informed of the diagnosis and the severity of the sleep disordered breathing. 3. This study shows sleep fragmentation and abnormal sleep stage percentages; these are nonspecific findings and per se do not signify an intrinsic sleep disorder or a cause for the patient's sleep-related symptoms. Causes include (but are not limited to) the first night effect of the sleep study, circadian rhythm disturbances, medication effect or an underlying mood disorder or medical problem.  4. The patient should be cautioned not to drive, work at heights, or operate dangerous or heavy equipment when tired or sleepy. Review and reiteration of good sleep hygiene measures should be pursued with any patient. 5. The study showed occasional PACs with evidence of atrial fibrillation; clinical correlation is recommended. Patient is followed by cardiology.  6. The patient will be seen in follow-up in the sleep clinic at [facility] for discussion of the test results, symptom and treatment compliance review, further management strategies, etc. The referring provider will be notified of the test results.  I certify that I have reviewed the entire raw data recording prior to the issuance of this report in accordance with the Standards of Accreditation of the American Academy of Sleep Medicine (AASM)  Star Age, MD, PhD Diplomat, American Board of Neurology and Sleep Medicine (Neurology and Sleep Medicine)

## 2020-03-27 ENCOUNTER — Ambulatory Visit: Payer: Medicare Other

## 2020-03-27 ENCOUNTER — Other Ambulatory Visit: Payer: Self-pay | Admitting: Internal Medicine

## 2020-03-28 ENCOUNTER — Other Ambulatory Visit (HOSPITAL_COMMUNITY)
Admission: RE | Admit: 2020-03-28 | Discharge: 2020-03-28 | Disposition: A | Discharge status: Home / Self Care | Payer: Medicare Other | Source: Ambulatory Visit | Attending: Internal Medicine | Admitting: Internal Medicine

## 2020-03-28 DIAGNOSIS — Z01812 Encounter for preprocedural laboratory examination: Secondary | ICD-10-CM | POA: Diagnosis not present

## 2020-03-28 DIAGNOSIS — Z20822 Contact with and (suspected) exposure to covid-19: Secondary | ICD-10-CM | POA: Diagnosis not present

## 2020-03-29 LAB — SARS CORONAVIRUS 2 (TAT 6-24 HRS): SARS Coronavirus 2: NEGATIVE

## 2020-03-29 NOTE — Anesthesia Preprocedure Evaluation (Addendum)
Anesthesia Evaluation  Patient identified by MRN, date of birth, ID band Patient awake    Reviewed: Allergy & Precautions, NPO status , Patient's Chart, lab work & pertinent test results  Airway Mallampati: II  TM Distance: >3 FB Neck ROM: Full    Dental no notable dental hx.    Pulmonary neg pulmonary ROS,    Pulmonary exam normal breath sounds clear to auscultation       Cardiovascular hypertension, Pt. on home beta blockers and Pt. on medications + dysrhythmias Atrial Fibrillation and Supra Ventricular Tachycardia  Rhythm:Regular Rate:Tachycardia  ECG: rate 107   Neuro/Psych Vertigo Hemiplegia and hemiparesis following cerebral infarction affecting left non-dominant side  CVA, Residual Symptoms negative psych ROS   GI/Hepatic negative GI ROS, Neg liver ROS,   Endo/Other  negative endocrine ROS  Renal/GU negative Renal ROS     Musculoskeletal negative musculoskeletal ROS (+)   Abdominal   Peds  Hematology HLD   Anesthesia Other Findings Left bundle  Reproductive/Obstetrics                            Anesthesia Physical Anesthesia Plan  ASA: IV  Anesthesia Plan: General   Post-op Pain Management:    Induction: Intravenous  PONV Risk Score and Plan: 3 and Ondansetron, Dexamethasone and Treatment may vary due to age or medical condition  Airway Management Planned: Oral ETT  Additional Equipment:   Intra-op Plan:   Post-operative Plan: Extubation in OR  Informed Consent: I have reviewed the patients History and Physical, chart, labs and discussed the procedure including the risks, benefits and alternatives for the proposed anesthesia with the patient or authorized representative who has indicated his/her understanding and acceptance.     Dental advisory given  Plan Discussed with: CRNA  Anesthesia Plan Comments:        Anesthesia Quick Evaluation

## 2020-03-30 ENCOUNTER — Ambulatory Visit (HOSPITAL_COMMUNITY)
Admission: RE | Admit: 2020-03-30 | Discharge: 2020-03-31 | Disposition: A | Discharge status: Home / Self Care | Payer: Medicare Other | Attending: Internal Medicine | Admitting: Internal Medicine

## 2020-03-30 ENCOUNTER — Ambulatory Visit (HOSPITAL_COMMUNITY): Payer: Medicare Other | Admitting: Anesthesiology

## 2020-03-30 ENCOUNTER — Other Ambulatory Visit: Payer: Self-pay

## 2020-03-30 ENCOUNTER — Encounter (HOSPITAL_COMMUNITY)
Admission: RE | Disposition: A | Discharge status: Home / Self Care | Payer: Self-pay | Source: Home / Self Care | Attending: Internal Medicine

## 2020-03-30 ENCOUNTER — Ambulatory Visit (HOSPITAL_COMMUNITY): Payer: Medicare Other | Admitting: Nurse Practitioner

## 2020-03-30 ENCOUNTER — Encounter (HOSPITAL_COMMUNITY): Payer: Self-pay | Admitting: Internal Medicine

## 2020-03-30 ENCOUNTER — Ambulatory Visit (HOSPITAL_COMMUNITY)
Admission: RE | Disposition: A | Discharge status: Home / Self Care | Payer: Self-pay | Source: Home / Self Care | Attending: Internal Medicine

## 2020-03-30 DIAGNOSIS — Z8673 Personal history of transient ischemic attack (TIA), and cerebral infarction without residual deficits: Secondary | ICD-10-CM | POA: Insufficient documentation

## 2020-03-30 DIAGNOSIS — Z79899 Other long term (current) drug therapy: Secondary | ICD-10-CM | POA: Insufficient documentation

## 2020-03-30 DIAGNOSIS — I4891 Unspecified atrial fibrillation: Secondary | ICD-10-CM

## 2020-03-30 DIAGNOSIS — Z7901 Long term (current) use of anticoagulants: Secondary | ICD-10-CM | POA: Diagnosis not present

## 2020-03-30 DIAGNOSIS — Z888 Allergy status to other drugs, medicaments and biological substances status: Secondary | ICD-10-CM | POA: Diagnosis not present

## 2020-03-30 DIAGNOSIS — I4819 Other persistent atrial fibrillation: Secondary | ICD-10-CM | POA: Insufficient documentation

## 2020-03-30 DIAGNOSIS — Z959 Presence of cardiac and vascular implant and graft, unspecified: Secondary | ICD-10-CM

## 2020-03-30 DIAGNOSIS — I4430 Unspecified atrioventricular block: Secondary | ICD-10-CM | POA: Diagnosis not present

## 2020-03-30 DIAGNOSIS — I442 Atrioventricular block, complete: Secondary | ICD-10-CM | POA: Diagnosis not present

## 2020-03-30 DIAGNOSIS — Z8249 Family history of ischemic heart disease and other diseases of the circulatory system: Secondary | ICD-10-CM | POA: Insufficient documentation

## 2020-03-30 DIAGNOSIS — I459 Conduction disorder, unspecified: Secondary | ICD-10-CM | POA: Diagnosis present

## 2020-03-30 HISTORY — PX: PACEMAKER IMPLANT: EP1218

## 2020-03-30 HISTORY — PX: AV NODE ABLATION: EP1193

## 2020-03-30 LAB — POCT ACTIVATED CLOTTING TIME
Activated Clotting Time: 184 seconds
Activated Clotting Time: 160 seconds

## 2020-03-30 SURGERY — AV NODE ABLATION
Anesthesia: LOCAL

## 2020-03-30 SURGERY — AV NODE ABLATION
Anesthesia: General

## 2020-03-30 SURGERY — AV NODE ABLATION

## 2020-03-30 MED ORDER — MIDAZOLAM HCL 5 MG/5ML IJ SOLN
INTRAMUSCULAR | Status: AC
  Filled 2020-03-30: qty 5

## 2020-03-30 MED ORDER — ONDANSETRON HCL 4 MG/2ML IJ SOLN: 4.0000 mg | Freq: Four times a day (QID) | INTRAMUSCULAR | Status: DC | PRN

## 2020-03-30 MED ORDER — SODIUM CHLORIDE 0.9% FLUSH: 3.0000 mL | Freq: Two times a day (BID) | INTRAVENOUS | Status: DC

## 2020-03-30 MED ORDER — FENTANYL CITRATE (PF) 100 MCG/2ML IJ SOLN
INTRAMUSCULAR | Status: AC
  Filled 2020-03-30: qty 2

## 2020-03-30 MED ORDER — MIDAZOLAM HCL 5 MG/5ML IJ SOLN
INTRAMUSCULAR | Status: DC | PRN
  Administered 2020-03-30 (×3): 1 mg via INTRAVENOUS

## 2020-03-30 MED ORDER — CEFAZOLIN SODIUM-DEXTROSE 2-4 GM/100ML-% IV SOLN
2.0000 g | INTRAVENOUS | Status: AC
  Administered 2020-03-30: 2 g via INTRAVENOUS

## 2020-03-30 MED ORDER — FENTANYL CITRATE (PF) 100 MCG/2ML IJ SOLN
INTRAMUSCULAR | Status: DC | PRN
  Administered 2020-03-30: 12.5 ug via INTRAVENOUS

## 2020-03-30 MED ORDER — HEPARIN (PORCINE) IN NACL 1000-0.9 UT/500ML-% IV SOLN
INTRAVENOUS | Status: AC
  Filled 2020-03-30: qty 500

## 2020-03-30 MED ORDER — SODIUM CHLORIDE 0.9 % IV SOLN: INTRAVENOUS | Status: DC

## 2020-03-30 MED ORDER — CEFAZOLIN SODIUM-DEXTROSE 1-4 GM/50ML-% IV SOLN
1.0000 g | Freq: Four times a day (QID) | INTRAVENOUS | Status: AC
  Administered 2020-03-30 – 2020-03-31 (×2): 1 g via INTRAVENOUS
  Filled 2020-03-30 (×3): qty 50

## 2020-03-30 MED ORDER — HEPARIN (PORCINE) IN NACL 1000-0.9 UT/500ML-% IV SOLN
INTRAVENOUS | Status: DC | PRN
  Administered 2020-03-30: 500 mL

## 2020-03-30 MED ORDER — BUPIVACAINE HCL (PF) 0.25 % IJ SOLN
INTRAMUSCULAR | Status: DC | PRN
  Administered 2020-03-30: 10 mL

## 2020-03-30 MED ORDER — SODIUM CHLORIDE 0.9 % IV SOLN
INTRAVENOUS | Status: AC
  Filled 2020-03-30: qty 2

## 2020-03-30 MED ORDER — CEFAZOLIN SODIUM-DEXTROSE 2-4 GM/100ML-% IV SOLN
INTRAVENOUS | Status: AC
  Filled 2020-03-30: qty 100

## 2020-03-30 MED ORDER — VERAPAMIL HCL 2.5 MG/ML IV SOLN
INTRAVENOUS | Status: DC | PRN
  Administered 2020-03-30 (×2): 2.5 mg via INTRAVENOUS

## 2020-03-30 MED ORDER — SODIUM CHLORIDE 0.9 % IV SOLN: 250.0000 mL | INTRAVENOUS | Status: DC | PRN

## 2020-03-30 MED ORDER — VERAPAMIL HCL 2.5 MG/ML IV SOLN
INTRAVENOUS | Status: AC
  Filled 2020-03-30: qty 2

## 2020-03-30 MED ORDER — LIDOCAINE HCL (PF) 1 % IJ SOLN
INTRAMUSCULAR | Status: DC | PRN
  Administered 2020-03-30: 60 mL

## 2020-03-30 MED ORDER — SODIUM CHLORIDE 0.9% FLUSH: 3.0000 mL | INTRAVENOUS | Status: DC | PRN

## 2020-03-30 MED ORDER — HEPARIN (PORCINE) IN NACL 1000-0.9 UT/500ML-% IV SOLN
INTRAVENOUS | Status: DC | PRN
  Administered 2020-03-30 (×2): 500 mL

## 2020-03-30 MED ORDER — HEPARIN SODIUM (PORCINE) 1000 UNIT/ML IJ SOLN
INTRAMUSCULAR | Status: DC | PRN
  Administered 2020-03-30: 3000 [IU] via INTRAVENOUS

## 2020-03-30 MED ORDER — MIDAZOLAM HCL 5 MG/5ML IJ SOLN
INTRAMUSCULAR | Status: DC | PRN
  Administered 2020-03-30 (×2): 1 mg via INTRAVENOUS

## 2020-03-30 MED ORDER — ISOPROTERENOL HCL 0.2 MG/ML IJ SOLN
INTRAMUSCULAR | Status: AC
  Filled 2020-03-30: qty 5

## 2020-03-30 MED ORDER — SODIUM CHLORIDE 0.9 % IV SOLN
INTRAVENOUS | Status: AC | PRN
  Administered 2020-03-30: 1 ug/min via INTRAVENOUS

## 2020-03-30 MED ORDER — BUPIVACAINE HCL (PF) 0.25 % IJ SOLN
INTRAMUSCULAR | Status: AC
  Filled 2020-03-30: qty 30

## 2020-03-30 MED ORDER — LIDOCAINE HCL (PF) 1 % IJ SOLN: INTRAMUSCULAR | Status: DC | PRN

## 2020-03-30 MED ORDER — LIDOCAINE HCL 1 % IJ SOLN
INTRAMUSCULAR | Status: AC
  Filled 2020-03-30: qty 60

## 2020-03-30 MED ORDER — SODIUM CHLORIDE 0.9 % IV SOLN
80.0000 mg | INTRAVENOUS | Status: AC
  Administered 2020-03-30: 80 mg

## 2020-03-30 MED ORDER — FUROSEMIDE 10 MG/ML IJ SOLN
40.0000 mg | Freq: Once | INTRAMUSCULAR | Status: AC
  Administered 2020-03-30: 40 mg via INTRAVENOUS
  Filled 2020-03-30: qty 4

## 2020-03-30 MED ORDER — HEPARIN SODIUM (PORCINE) 1000 UNIT/ML IJ SOLN
INTRAMUSCULAR | Status: AC
  Filled 2020-03-30: qty 1

## 2020-03-30 SURGICAL SUPPLY — 19 items
CABLE SURGICAL S-101-97-12 (CABLE) ×2 IMPLANT
CATH BLAZER 7FR 4MM LG 5031TK2 (ABLATOR) ×1 IMPLANT
CATH RIGHTSITE C315HIS02 (CATHETERS) ×1 IMPLANT
ELECT DEFIB PAD ADLT CADENCE (PAD) ×1 IMPLANT
HEMOSTAT SURGICEL 2X4 FIBR (HEMOSTASIS) ×1 IMPLANT
INTRODUCER SWARTZ SRO 8F (SHEATH) ×1 IMPLANT
IPG PACE AZUR XT DR MRI W1DR01 (Pacemaker) IMPLANT
LEAD CAPSURE NOVUS 5076-52CM (Lead) ×1 IMPLANT
LEAD SELECT SECURE 3830 383069 (Lead) IMPLANT
PACE AZURE XT DR MRI W1DR01 (Pacemaker) ×2 IMPLANT
PACK EP LATEX FREE (CUSTOM PROCEDURE TRAY) ×2
PACK EP LF (CUSTOM PROCEDURE TRAY) ×1 IMPLANT
PAD PRO RADIOLUCENT 2001M-C (PAD) ×2 IMPLANT
SELECT SECURE 3830 383069 (Lead) ×2 IMPLANT
SHEATH 7FR PRELUDE SNAP 13 (SHEATH) ×2 IMPLANT
SHEATH PINNACLE 8F 10CM (SHEATH) ×1 IMPLANT
SLITTER 6232ADJ (MISCELLANEOUS) ×1 IMPLANT
TRAY PACEMAKER INSERTION (PACKS) ×2 IMPLANT
WIRE HI TORQ VERSACORE-J 145CM (WIRE) ×1 IMPLANT

## 2020-03-30 SURGICAL SUPPLY — 8 items
CATH BLAZERPRIME XP LG CV 10MM (ABLATOR) ×1 IMPLANT
CATH THERMISTOR 7FR 4MM (ABLATOR) ×1 IMPLANT
INTRODUCER SWARTZ SRO 8F (SHEATH) ×1 IMPLANT
KIT MICROPUNCTURE NIT STIFF (SHEATH) ×1 IMPLANT
PACK EP LATEX FREE (CUSTOM PROCEDURE TRAY) ×2
PACK EP LF (CUSTOM PROCEDURE TRAY) ×1 IMPLANT
PAD PRO RADIOLUCENT 2001M-C (PAD) ×2 IMPLANT
SHEATH PINNACLE 8F 10CM (SHEATH) ×3 IMPLANT

## 2020-03-30 NOTE — Interval H&P Note (Signed)
History and Physical Interval Note:  03/30/2020 7:28 AM  Mary Mercado  has presented today for surgery, with the diagnosis of Left bundle.  The various methods of treatment have been discussed with the patient and family. After consideration of risks, benefits and other options for treatment, the patient has consented to  Procedure(s): AV NODE ABLATION (N/A) PACEMAKER IMPLANT (N/A) as a surgical intervention.  The patient's history has been reviewed, patient examined, no change in status, stable for surgery.  I have reviewed the patient's chart and labs.  Questions were answered to the patient's satisfaction.     Virl Axe  Rapid afib --permanent  For AV ablation and pacing

## 2020-03-30 NOTE — Progress Notes (Signed)
Site area: rt groin fa and fv sheaths Site Prior to Removal:  Level 0 Pressure Applied For: 30 minutes Manual:   yes Patient Status During Pull:  stable Post Pull Site:  Level 0 Post Pull Instructions Given:  yes Post Pull Pulses Present: rt dp palpable Dressing Applied:  Gauze and tegaderm Bedrest begins @ 1930 Comments:

## 2020-03-30 NOTE — Progress Notes (Signed)
Per Dr Caryl Comes: leave defib/pacing pads on patient. Do not need to stay connected to the [place] at this time unless pacing needed. L arm sling applied, patient on bedrest.   74F sheath removed from R groin, manual pressure held. Bedrest overnight.   Carl Best RN EP Lab

## 2020-03-30 NOTE — Progress Notes (Signed)
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  Have reviewed with her and her husband and will proceed with repeat ablation this afternoon

## 2020-03-31 ENCOUNTER — Encounter: Payer: Medicare Other | Admitting: Occupational Therapy

## 2020-03-31 ENCOUNTER — Ambulatory Visit (HOSPITAL_COMMUNITY): Payer: Medicare Other

## 2020-03-31 ENCOUNTER — Ambulatory Visit: Payer: Medicare Other

## 2020-03-31 ENCOUNTER — Encounter (HOSPITAL_COMMUNITY): Payer: Self-pay | Admitting: Internal Medicine

## 2020-03-31 DIAGNOSIS — Z7901 Long term (current) use of anticoagulants: Secondary | ICD-10-CM | POA: Diagnosis not present

## 2020-03-31 DIAGNOSIS — Z79899 Other long term (current) drug therapy: Secondary | ICD-10-CM | POA: Diagnosis not present

## 2020-03-31 DIAGNOSIS — I517 Cardiomegaly: Secondary | ICD-10-CM | POA: Diagnosis not present

## 2020-03-31 DIAGNOSIS — Z8673 Personal history of transient ischemic attack (TIA), and cerebral infarction without residual deficits: Secondary | ICD-10-CM | POA: Diagnosis not present

## 2020-03-31 DIAGNOSIS — I442 Atrioventricular block, complete: Secondary | ICD-10-CM | POA: Diagnosis not present

## 2020-03-31 DIAGNOSIS — I4430 Unspecified atrioventricular block: Secondary | ICD-10-CM

## 2020-03-31 DIAGNOSIS — J439 Emphysema, unspecified: Secondary | ICD-10-CM | POA: Diagnosis not present

## 2020-03-31 DIAGNOSIS — Z8249 Family history of ischemic heart disease and other diseases of the circulatory system: Secondary | ICD-10-CM | POA: Diagnosis not present

## 2020-03-31 DIAGNOSIS — Z888 Allergy status to other drugs, medicaments and biological substances status: Secondary | ICD-10-CM | POA: Diagnosis not present

## 2020-03-31 DIAGNOSIS — I4819 Other persistent atrial fibrillation: Secondary | ICD-10-CM | POA: Diagnosis not present

## 2020-03-31 MED ORDER — DABIGATRAN ETEXILATE MESYLATE 150 MG PO CAPS: 150.0000 mg | ORAL_CAPSULE | Freq: Two times a day (BID) | ORAL | Status: DC

## 2020-03-31 NOTE — Discharge Summary (Addendum)
ELECTROPHYSIOLOGY PROCEDURE DISCHARGE SUMMARY    Patient ID: Mary Mercado,  MRN: [id number], DOB/AGE: 80-23-1942 78 y.o.  Admit date: 03/30/2020 Discharge date: 03/31/2020  Primary Care Physician: [name], MD  Primary Cardiologist: Mertie Moores, MD  Electrophysiologist: Dr. 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 Duloxetine     Causes Bp elevation.  Increased body jerks, nausea  . Acetaminophen Hives and Rash     Procedures This Admission:  1.  Implantation of a Medtronic dual chamber PPM on 03/30/2020 by Dr. Caryl Comes. The patient received a Medtronic model number [id number] PPM with model number [id number] right atrial lead and [id number] right ventricular lead. There were no immediate post procedure complications. 2. AV nodal ablation 03/30/2020 with apparent termination of antegrade conduction, with intermittent bump ablation of conduction during the procedure -> Pt ultimately had return of conduction 3. Repeat AV nodal ablation 03/30/2020 given return of antegrade conduction s/p initial attempt 2.  CXR on 03/31/20 demonstrated no pneumothorax status post device implantation.   Brief HPI: Mary Mercado is a 79 y.o. female was followed by electrophysiology in the outpatient setting for poorly controlled atrial fibrillation. Decision was made for consideration of PPM implantation with AV nodal ablation.  Past medical history includes above.   Risks, benefits, and alternatives to PPM implantation and AV nodal ablation were reviewed with the patient who wished to proceed.   Hospital Course:  The patient was admitted and underwent implantation of a Medtronic dual chamber PPM with details as outlined above. Pt then underwent AV nodal ablation with  intermittent loss of conduction when bumped by the catheter. Within several hours of the case, pt had return of antegrade conduction, and was taken for repeat AV nodal ablation with complete loss of antegrade conduction. She was monitored on telemetry overnight which demonstrated V pacing at 90 bpm (appropriately programmed).  Left chest was without hematoma or ecchymosis.  The device was interrogated and found to be functioning normally.  CXR was obtained and demonstrated no pneumothorax status post device implantation.  Wound care, arm mobility, and restrictions were reviewed with the patient.  The patient was examined and considered stable for discharge to home.    Regarding blood thinner therapy, they were instructed to resume their pradaxa on 12/8 with the EVENING dose.    Physical Exam: Vitals:   03/30/20 2300 03/31/20 0054 03/31/20 0512 03/31/20 0827  BP: 138/88 (!) 142/94 (!) 142/91 130/89  Pulse: 89 89 89 89  Resp: 16 17 19 18   Temp:  97.9 F (36.6 C) 98 F (36.7 C) 98 F (36.7 C)  TempSrc:  Oral Oral Oral  SpO2: 96% 97% 97% 96%  Weight:   61.2 kg   Height:        GEN- The patient is well appearing, alert and oriented x 3 today.   HEENT: normocephalic, atraumatic; sclera clear, conjunctiva pink; hearing intact; oropharynx clear; neck supple, no JVP Lymph- no cervical lymphadenopathy Lungs- Clear to ausculation bilaterally, normal work of breathing.  No wheezes, rales, rhonchi Heart- Regular rate and rhythm, no murmurs, rubs or gallops, PMI not laterally displaced GI- soft, non-tender, non-distended, bowel sounds present, no hepatosplenomegaly Extremities- no clubbing, cyanosis, or edema; DP/PT/radial pulses 2+ bilaterally MS- no significant deformity or atrophy Skin- warm and dry, no rash or lesion,  left chest without hematoma/ecchymosis Psych- euthymic mood, full affect Neuro- strength and sensation are intact   Labs:   Lab Results  Component Value Date   WBC 7.8  03/18/2020   HGB 15.1 03/18/2020   HCT 44.6 03/18/2020   MCV 90 03/18/2020   PLT 280 03/18/2020   No results for input(s): NA, K, CL, CO2, BUN, CREATININE, CALCIUM, PROT, BILITOT, ALKPHOS, ALT, AST, GLUCOSE in the last 168 hours.  Invalid input(s): LABALBU  Discharge Medications:  Allergies as of 03/31/2020      Reactions   Duloxetine    Causes Bp elevation.  Increased body jerks, nausea   Acetaminophen Hives, Rash      Medication List    STOP taking these medications   diltiazem 120 MG 24 hr capsule Commonly known as: Cardizem CD   diltiazem 240 MG 24 hr capsule Commonly known as: CARDIZEM CD     TAKE these medications   atorvastatin 80 MG tablet Commonly known as: LIPITOR TAKE 1 TABLET (80 MG TOTAL) BY MOUTH DAILY AT 6 PM.   dabigatran 150 MG Caps capsule Commonly known as: Pradaxa Take 1 capsule (150 mg total) by mouth 2 (two) times daily. Start taking on: April 01, 2020 What changed: See the new instructions.   diltiazem 30 MG tablet Commonly known as: CARDIZEM Take 1 tablet (30 mg total) by mouth 4 (four) times daily as needed. Prn What changed:   reasons to take this  additional instructions   Klor-Con M10 10 MEQ tablet Generic drug: potassium chloride TAKE 1 TABLET BY MOUTH EVERY DAY What changed: how much to take   metoprolol tartrate 25 MG tablet Commonly known as: LOPRESSOR Take 0.5 tablets (12.5 mg total) by mouth 2 (two) times daily.       Disposition:    Follow-up Information    Baldwin Jamaica, PA-C Follow up on 05/08/2020.   Specialty: Cardiology Why: at 1145 for 1 month post AV nodal ablation f/u Contact information: [address] [place] [address] [phone]        Deboraha Sprang, MD Follow up on 07/13/2020.   Specialty: Cardiology Why: at 1130 for 3 month post pacer check Contact information: [address] [place] [address] [phone]        [facility] Follow up on 04/09/2020.   Why: at 240 for post pacemaker wound check Contact information: [place] [address] [phone]              Duration of Discharge Encounter: Greater than 30 minutes including physician time.  Jacalyn Lefevre, PA-C  03/31/2020 10:24 AM

## 2020-03-31 NOTE — Plan of Care (Signed)
DISCHARGE NOTE HOME Mary Mercado to be discharged home per MD order. Discussed prescriptions and follow up appointments with the patient. Medication list explained in detail. Patient verbalized understanding.  Skin clean, dry and intact without evidence of skin break down, no evidence of skin tears noted. IV catheter discontinued intact. Site without signs and symptoms of complications. Dressing and pressure applied. Pt denies pain at the site currently. No complaints noted.  Instructed patient on post pacemaker arm movement.  Patient free of lines, drains, and wounds.   An After Visit Summary (AVS) was printed and given to the patient. Patient escorted via wheelchair, and discharged home via private auto.  Stephan Minister, RN

## 2020-03-31 NOTE — Plan of Care (Signed)
  Problem: Education: Goal: Knowledge of cardiac device and self-care will improve Outcome: Progressing Goal: Ability to safely manage health related needs after discharge will improve Outcome: Progressing Goal: Individualized Educational Video(s) Outcome: Progressing   

## 2020-03-31 NOTE — Discharge Instructions (Signed)
Cardiac Ablation, Care After  This sheet gives you information about how to care for yourself after your procedure. Your health care provider may also give you more specific instructions. If you have problems or questions, contact your health care provider. What can I expect after the procedure? After the procedure, it is common to have:  Bruising around your puncture site.  Tenderness around your puncture site.  Skipped heartbeats.  Tiredness (fatigue).  Follow these instructions at home: Puncture site care   Follow instructions from your health care provider about how to take care of your puncture site. Make sure you: ? If present, leave stitches (sutures), skin glue, or adhesive strips in place. These skin closures may need to stay in place for up to 2 weeks. If adhesive strip edges start to loosen and curl up, you may trim the loose edges. Do not remove adhesive strips completely unless your health care provider tells you to do that. ? If a large square bandage is present, this may be removed 24 hours after surgery.   Check your puncture site every day for signs of infection. Check for: ? Redness, swelling, or pain. ? Fluid or blood. If your puncture site starts to bleed, lie down on your back, apply firm pressure to the area, and contact your health care provider. ? Warmth. ? Pus or a bad smell. Driving  Do not drive for at least 4 days after your procedure or however long your health care provider recommends. (Do not resume driving if you have previously been instructed not to drive for other health reasons.)  Do not drive or use heavy machinery while taking prescription pain medicine. Activity  Avoid activities that take a lot of effort for at least 7 days after your procedure.  Do not lift anything that is heavier than 5 lb (4.5 kg) for one week.   No sexual activity for 1 week.   Return to your normal activities as told by your health care provider. Ask your health  care provider what activities are safe for you. General instructions  Take over-the-counter and prescription medicines only as told by your health care provider.  Do not use any products that contain nicotine or tobacco, such as cigarettes and e-cigarettes. If you need help quitting, ask your health care provider.  You may shower after 24 hours, but Do not take baths, swim, or use a hot tub for 1 week.   Do not drink alcohol for 24 hours after your procedure.  Keep all follow-up visits as told by your health care provider. This is important. Contact a health care provider if:  You have redness, mild swelling, or pain around your puncture site.  You have fluid or blood coming from your puncture site that stops after applying firm pressure to the area.  Your puncture site feels warm to the touch.  You have pus or a bad smell coming from your puncture site.  You have a fever.  You have chest pain or discomfort that spreads to your neck, jaw, or arm.  You are sweating a lot.  You feel nauseous.  You have a fast or irregular heartbeat.  You have shortness of breath.  You are dizzy or light-headed and feel the need to lie down.  You have pain or numbness in the arm or leg closest to your puncture site. Get help right away if:  Your puncture site suddenly swells.  Your puncture site is bleeding and the bleeding does not stop after   applying firm pressure to the area. These symptoms may represent a serious problem that is an emergency. Do not wait to see if the symptoms will go away. Get medical help right away. Call your local emergency services (911 in the [place] Do not drive yourself to the hospital. Summary  After the procedure, it is normal to have bruising and tenderness at the puncture site in your groin, neck, or forearm.  Check your puncture site every day for signs of infection.  Get help right away if your puncture site is bleeding and the bleeding does not stop  after applying firm pressure to the area. This is a medical emergency. This information is not intended to replace advice given to you by your health care provider. Make sure you discuss any questions you have with your health care provider.   After Your Pacemaker   . You have a Medtronic Pacemaker  ACTIVITY . Do not lift your arm above shoulder height for 1 week after your procedure. After 7 days, you may progress as below.  . You should remove your sling 24 hours after your procedure, unless otherwise instructed by your provider.     Tuesday April 07, 2020  Wednesday April 08, 2020 Thursday April 09, 2020 Friday April 10, 2020   . Do not lift, push, pull, or carry anything over 10 pounds with the affected arm until 6 weeks (Tuesday May 12, 2020 ) after your procedure.   . Do NOT DRIVE until you have been seen for your wound check, or as long as instructed by your healthcare provider.   . Ask your healthcare provider when you can go back to work   INCISION/Dressing . You should resume your Pradaxa with the EVENING dose 12/8 (Wednesday)   . If large square, outer bandage is left in place, this can be removed after 24 hours from your procedure. Do not remove steri-strips or glue as below.   . Monitor your Pacemaker site for redness, swelling, and drainage. Call the device clinic at [phone] if you experience these symptoms or fever/chills.  . If your incision is closed with Dermabond/Surgical glue. You may shower 1 day after your pacemaker implant and wash around the site with soap and water.    Marland Kitchen Avoid lotions, ointments, or perfumes over your incision until it is well-healed.  . You may use a hot tub or a pool AFTER your wound check appointment if the incision is completely closed.  Marland Kitchen PAcemaker Alerts:  Some alerts are vibratory and others beep. These are NOT emergencies. Please call our office to let us know. If this occurs at night or on weekends, it can  wait until the next business day. Send a remote transmission.  . If your device is capable of reading fluid status (for heart failure), you will be offered monthly monitoring to review this with you.   DEVICE MANAGEMENT . Remote monitoring is used to monitor your pacemaker from home. This monitoring is scheduled every 91 days by our office. It allows [place] to keep an eye on the functioning of your device to ensure it is working properly. You will routinely see your Electrophysiologist annually (more often if necessary).   . You should receive your ID card for your new device in 4-8 weeks. Keep this card with you at all times once received. Consider wearing a medical alert bracelet or necklace.  . Your Pacemaker may be MRI compatible. This will be discussed at your next office visit/wound check.  You should avoid contact with strong electric or magnetic fields.    Do not use amateur (ham) radio equipment or electric (arc) welding torches. MP3 player headphones with magnets should not be used. Some devices are safe to use if held at least 12 inches (30 cm) from your Pacemaker. These include power tools, lawn mowers, and speakers. If you are unsure if something is safe to use, ask your health care provider.   When using your cell phone, hold it to the ear that is on the opposite side from the Pacemaker. Do not leave your cell phone in a pocket over the Pacemaker.   You may safely use electric blankets, heating pads, computers, and microwave ovens.  Call the office right away if:  You have chest pain.  You feel more short of breath than you have felt before.  You feel more light-headed than you have felt before.  Your incision starts to open up.  This information is not intended to replace advice given to you by your health care provider. Make sure you discuss any questions you have with your health care provider.

## 2020-04-01 ENCOUNTER — Encounter (HOSPITAL_COMMUNITY): Payer: Self-pay | Admitting: Internal Medicine

## 2020-04-02 ENCOUNTER — Ambulatory Visit: Payer: Medicare Other

## 2020-04-02 ENCOUNTER — Other Ambulatory Visit: Payer: Self-pay | Admitting: Family Medicine

## 2020-04-02 MED FILL — Cefazolin Sodium-Dextrose IV Solution 2 GM/100ML-4%: INTRAVENOUS | Qty: 100 | Status: AC

## 2020-04-02 MED FILL — Gentamicin Sulfate Inj 40 MG/ML: INTRAMUSCULAR | Qty: 80 | Status: AC

## 2020-04-02 MED FILL — Lidocaine HCl Local Inj 1%: INTRAMUSCULAR | Qty: 60 | Status: AC

## 2020-04-02 NOTE — Telephone Encounter (Signed)
Last OV 02/05/20 Last refill Pradaxa ???                 Atorvastatin 03/09/20 #30/0 Next OV not scheduled

## 2020-04-03 ENCOUNTER — Other Ambulatory Visit: Payer: Self-pay | Admitting: Family Medicine

## 2020-04-07 ENCOUNTER — Ambulatory Visit: Payer: Medicare Other

## 2020-04-07 ENCOUNTER — Telehealth: Payer: Self-pay

## 2020-04-07 ENCOUNTER — Encounter: Payer: Medicare Other | Admitting: Occupational Therapy

## 2020-04-07 NOTE — Telephone Encounter (Signed)
Called patient to schedule sleep study. Patient just had Ablation and would like to wait until after Jan. 16 th.

## 2020-04-09 ENCOUNTER — Other Ambulatory Visit: Payer: Self-pay

## 2020-04-09 ENCOUNTER — Encounter: Payer: Medicare Other | Admitting: Occupational Therapy

## 2020-04-09 ENCOUNTER — Ambulatory Visit: Payer: Medicare Other

## 2020-04-09 ENCOUNTER — Ambulatory Visit (INDEPENDENT_AMBULATORY_CARE_PROVIDER_SITE_OTHER): Payer: Medicare Other | Admitting: Emergency Medicine

## 2020-04-09 DIAGNOSIS — I442 Atrioventricular block, complete: Secondary | ICD-10-CM | POA: Diagnosis not present

## 2020-04-09 LAB — CUP PACEART INCLINIC DEVICE CHECK
Battery Remaining Longevity: 136 mo
Battery Voltage: 3.19 V
Brady Statistic AP VP Percent: 0 %
Brady Statistic AP VS Percent: 0 %
Brady Statistic AS VP Percent: 99.86 %
Brady Statistic AS VS Percent: 0.14 %
Brady Statistic RA Percent Paced: 0 %
Brady Statistic RV Percent Paced: 99.86 %
Date Time Interrogation Session: 20211216150200
Implantable Lead Implant Date: 20211206
Implantable Lead Implant Date: 20211206
Implantable Lead Location: 753858
Implantable Lead Location: 753860
Implantable Lead Model: 3830
Implantable Lead Model: 5076
Implantable Pulse Generator Implant Date: 20211206
Lead Channel Impedance Value: 418 Ohm
Lead Channel Impedance Value: 475 Ohm
Lead Channel Impedance Value: 570 Ohm
Lead Channel Impedance Value: 570 Ohm
Lead Channel Pacing Threshold Amplitude: 1.25 V
Lead Channel Pacing Threshold Pulse Width: 0.4 ms
Lead Channel Sensing Intrinsic Amplitude: 13.625 mV
Lead Channel Sensing Intrinsic Amplitude: 4.375 mV
Lead Channel Setting Pacing Amplitude: 3.5 V
Lead Channel Setting Pacing Pulse Width: 0.4 ms
Lead Channel Setting Sensing Sensitivity: 2 mV

## 2020-04-09 NOTE — Progress Notes (Signed)
Wound check appointment. Steri-strips removed. Wound without redness or edema. Incision edges approximated, wound well healed. Normal device function. Thresholds, sensing, and impedances consistent with implant measurements. Device programmed at 3.5V/auto capture programmed on for extra safety margin until 3 month visit. Lower rate decreased to 80 bpm per protocol after ablation.Histogram distribution appropriate for patient and level of activity. No mode switches or high ventricular rates noted. Patient educated about wound care, arm mobility, lifting restrictions. Enrolled in remote follow-up and next remote  07/02/20. ROV with Enedina Finner PA 05/08/20.

## 2020-04-14 ENCOUNTER — Ambulatory Visit: Payer: Medicare Other

## 2020-04-14 ENCOUNTER — Encounter: Payer: Medicare Other | Admitting: Occupational Therapy

## 2020-04-16 ENCOUNTER — Encounter: Payer: Medicare Other | Admitting: Occupational Therapy

## 2020-04-27 ENCOUNTER — Telehealth: Payer: Self-pay | Admitting: Family Medicine

## 2020-04-27 NOTE — Telephone Encounter (Signed)
Left message for patient to call back and schedule Medicare Annual Wellness Visit (AWV) either virtually or in office.   Last AWV no information please schedule at anytime with [facility] Nurse Health Advisor 1 or 2   This should be a 45 minute visit. 

## 2020-05-08 ENCOUNTER — Encounter: Payer: Medicare Other | Admitting: Physician Assistant

## 2020-05-12 ENCOUNTER — Encounter: Payer: Medicare Other | Admitting: Student

## 2020-05-14 ENCOUNTER — Ambulatory Visit (INDEPENDENT_AMBULATORY_CARE_PROVIDER_SITE_OTHER): Payer: Medicare Other | Admitting: Nurse Practitioner

## 2020-05-14 ENCOUNTER — Other Ambulatory Visit: Payer: Self-pay

## 2020-05-14 ENCOUNTER — Encounter: Payer: Self-pay | Admitting: Nurse Practitioner

## 2020-05-14 VITALS — BP 142/90 | HR 80 | Ht 65.0 in | Wt 130.8 lb

## 2020-05-14 DIAGNOSIS — I959 Hypotension, unspecified: Secondary | ICD-10-CM | POA: Diagnosis not present

## 2020-05-14 DIAGNOSIS — I4819 Other persistent atrial fibrillation: Secondary | ICD-10-CM

## 2020-05-14 DIAGNOSIS — D696 Thrombocytopenia, unspecified: Secondary | ICD-10-CM

## 2020-05-14 LAB — CUP PACEART INCLINIC DEVICE CHECK
Date Time Interrogation Session: 20220120083708
Implantable Lead Implant Date: 20211206
Implantable Lead Implant Date: 20211206
Implantable Lead Location: 753858
Implantable Lead Location: 753860
Implantable Lead Model: 3830
Implantable Lead Model: 5076
Implantable Pulse Generator Implant Date: 20211206

## 2020-05-14 MED ORDER — METOPROLOL TARTRATE 25 MG PO TABS: 12.5000 mg | ORAL_TABLET | Freq: Every evening | ORAL | 0 refills | Status: DC

## 2020-05-14 MED ORDER — METOPROLOL TARTRATE 25 MG PO TABS: 12.5000 mg | ORAL_TABLET | Freq: Every evening | ORAL | 3 refills | Status: DC

## 2020-05-14 NOTE — Addendum Note (Signed)
Addended by: Campbell Riches on: 05/14/2020 08:43 AM   Modules accepted: Orders

## 2020-05-14 NOTE — Patient Instructions (Signed)
Medication Instructions:  Your physician has recommended you make the following change in your medication:   1) Stop morning dose of Metoprolol for one week.  Continue monitoring BP.  If BP is still low stop the medication all together.  Let us know if you stop the medication.    Lab Work: None    Testing/Procedures: None ordered   Follow-Up: At [facility], you and your health needs are our priority.  As part of our continuing mission to provide you with exceptional heart care, we have created designated Provider Care Teams.  These Care Teams include your primary Cardiologist (physician) and Advanced Practice Providers (APPs -  Physician Assistants and Nurse Practitioners) who all work together to provide you with the care you need, when you need it.  We recommend signing up for the patient portal called "MyChart".  Sign up information is provided on this After Visit Summary.  MyChart is used to connect with patients for Virtual Visits (Telemedicine).  Patients are able to view lab/test results, encounter notes, upcoming appointments, etc.  Non-urgent messages can be sent to your provider as well.   To learn more about what you can do with MyChart, go to [email or web address].    Your next appointment:   As scheduled with Dr. Caryl Comes  The format for your next appointment:   In Person  Provider:   Virl Axe, MD   Other Instructions

## 2020-05-14 NOTE — Progress Notes (Signed)
Electrophysiology Office Note Date: 05/14/2020  ID:  [facility] Mary Mercado, Mary Mercado Apr 16, 1941, MRN [id number]  PCP: Nelwyn Salisbury, MD Primary Cardiologist: Nahser Electrophysiologist: Graciela Husbands  CC: Pacemaker follow-up  Mary Mercado is a 80 y.o. female seen today for Dr Graciela Husbands.  She presents today for routine electrophysiology followup.  Since last being seen in our clinic, the patient reports doing reasonably well.  She has ongoing dizziness and relatively soft blood pressures at home.   She denies chest pain, palpitations, dyspnea, PND, orthopnea, nausea, vomiting, syncope, edema, weight gain, or early satiety.  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 Allergy   . CVA (cerebral vascular accident) (HCC) 05/2018, 01/09/20  . Hyperlipidemia LDL goal <70   . Hypertension   . Palpitations   . Vertigo, benign positional    Past Surgical History:  Procedure Laterality Date  . AV NODE ABLATION N/A 03/30/2020   Procedure: AV NODE ABLATION;  Surgeon: Duke Salvia, MD;  Location: [facility] INVASIVE CV LAB;  Service: Cardiovascular;  Laterality: N/A;  . AV NODE ABLATION N/A 03/30/2020   Procedure: AV NODE ABLATION;  Surgeon: Duke Salvia, MD;  Location: [facility] INVASIVE CV LAB;  Service: Cardiovascular;  Laterality: N/A;  . CARDIOVERSION Left 10/31/2018   Procedure: CARDIOVERSION;  Surgeon: Vesta Mixer, MD;  Location: [facility] ENDOSCOPY;  Service: Cardiovascular;  Laterality: Left;  . CARDIOVERSION N/A 01/08/2020   Procedure: CARDIOVERSION;  Surgeon: Parke Poisson, MD;  Location: [facility] ENDOSCOPY;  Service: Cardiovascular;  Laterality: N/A;  . Hemilaminectomy and microdiskectomy at L4-5 on the left.  12/10/2003  . PACEMAKER IMPLANT N/A 03/30/2020   Procedure: PACEMAKER IMPLANT;  Surgeon: Duke Salvia, MD;  Location: [facility] INVASIVE CV LAB;  Service: Cardiovascular;  Laterality: N/A;   . TEE WITHOUT CARDIOVERSION N/A 06/05/2018   Procedure: TRANSESOPHAGEAL ECHOCARDIOGRAM (TEE);  Surgeon: Lewayne Bunting, MD;  Location: [facility] ENDOSCOPY;  Service: Cardiovascular;  Laterality: N/A;  loop    Current Outpatient Medications  Medication Sig Dispense Refill  . atorvastatin (LIPITOR) 80 MG tablet TAKE 1 TABLET (80 MG TOTAL) BY MOUTH DAILY AT 6 PM. 30 tablet 11  . diltiazem (CARDIZEM) 30 MG tablet Take 1 tablet (30 mg total) by mouth 4 (four) times daily as needed. Prn (Patient taking differently: Take 30 mg by mouth 4 (four) times daily as needed (Increase heart rate).) 120 tablet 11  . KLOR-CON M10 10 MEQ tablet TAKE 1 TABLET BY MOUTH EVERY DAY 90 tablet 3  . metoprolol tartrate (LOPRESSOR) 25 MG tablet Take 0.5 tablets (12.5 mg total) by mouth 2 (two) times daily. 90 tablet 3  . PRADAXA 150 MG CAPS capsule TAKE 1 CAPSULE (150 MG TOTAL) BY MOUTH EVERY 12 (TWELVE) HOURS. 60 capsule 11   No current facility-administered medications for this visit.   Facility-Administered Medications Ordered in Other Visits  Medication Dose Route Frequency Provider Last Rate Last Admin  . regadenoson (LEXISCAN) injection SOLN 0.4 mg  0.4 mg Intravenous Once Jodelle Red, MD      . technetium tetrofosmin (TC-MYOVIEW) injection 32 millicurie  32 millicurie Intravenous Once PRN Jodelle Red, MD        Allergies:   Duloxetine and Acetaminophen   Social History: Social History   Socioeconomic History  . Marital status: Married    Spouse name: [name]  . Number of children: Not on file  . Years of education:  12+  . Highest education level: Not on file  Occupational History    Comment: retired  Tobacco Use  . Smoking status: Never Smoker  . Smokeless tobacco: Never Used  Vaping Use  . Vaping Use: Never used  Substance and Sexual Activity  . Alcohol use: Never  . Drug use: Never  . Sexual activity: Not on file  Other Topics Concern  . Not on file  Social History Narrative    Lives with husband   caffeine none now   Social Determinants of Health   Financial Resource Strain: Not on file  Food Insecurity: Not on file  Transportation Needs: Not on file  Physical Activity: Not on file  Stress: Not on file  Social Connections: Not on file  Intimate Partner Violence: Not on file    Family History: Family History  Problem Relation Age of Onset  . Colon cancer Father   . Diabetes Mother   . Heart attack Mother        40  . Heart attack Brother 74     Review of Systems: All other systems reviewed and are otherwise negative except as noted above.   Physical Exam: VS:  BP (!) 142/90   Pulse 80   Ht 5\' 5"  (1.651 m)   Wt 130 lb 12.8 oz (59.3 kg)   SpO2 98%   BMI 21.77 kg/m  , BMI Body mass index is 21.77 kg/m.  GEN- The patient is elderly appearing, alert and oriented x 3 today.   HEENT: normocephalic, atraumatic; sclera clear, conjunctiva pink; hearing intact; oropharynx clear; neck supple  Lungs- Clear to ausculation bilaterally, normal work of breathing.  No wheezes, rales, rhonchi Heart- Regular rate and rhythm (paced) GI- soft, non-tender, non-distended, bowel sounds present  Extremities- no clubbing, cyanosis, or edema  MS- no significant deformity or atrophy Skin- warm and dry, no rash or lesion; PPM pocket well healed, + device prominence  Psych- euthymic mood, full affect Neuro- strength and sensation are intact  PPM Interrogation- reviewed in detail today,  See PACEART report  EKG:  EKG is not ordered today.  Recent Labs: 11/14/2019: TSH 1.48 01/15/2020: ALT 20 03/16/2020: Magnesium 1.9 03/18/2020: BUN 11; Creatinine, Ser 0.75; Hemoglobin 15.1; Platelets 280; Potassium 4.4; Sodium 140   Wt Readings from Last 3 Encounters:  05/14/20 130 lb 12.8 oz (59.3 kg)  03/31/20 134 lb 14.7 oz (61.2 kg)  03/24/20 134 lb (60.8 kg)     Other studies Reviewed: Additional studies/ records that were reviewed today include: Dr Olin Pia  notes, hospital records    Assessment and Plan:  1.  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  Dizziness/ hypotension She is on very low dose of Metoprolol BP at home is 169-678'L systolic I have advised to stop AM dose of Metoprolol tomorrow and follow blood pressures.  If SBP <130 after 1 week, ok to stop evening dose.  I have advised ok to get COVID booster vaccine   Current medicines are reviewed at length with the patient today.   The patient does not have concerns regarding her medicines.  The following changes were made today:  Decrease metoprolol as above  Labs/ tests ordered today include: none No orders of the defined types were placed in this encounter.  Disposition:   Follow up with Dr Caryl Comes 3 months      Signed, Chanetta Marshall, NP 05/14/2020 8:34 AM  [place] [address] [phone] (office) [phone] (fax)

## 2020-05-18 ENCOUNTER — Telehealth: Payer: Self-pay | Admitting: Emergency Medicine

## 2020-05-18 NOTE — Telephone Encounter (Signed)
PA started for Pradaxa on CMM.  Key: [id number]. Awaiting determination from [employer].

## 2020-05-19 NOTE — Telephone Encounter (Signed)
Pt's husband, Larhonda Dettloff (on [place]) called, please call [employer]. They have been reaching out to physician with no response.  Physician just have to  inform  [employer] she cannot take Lyrica, but can take Pradaxa. They are glad to change to Tier 3 which reduce cost of Pradaxa tremulously.  Would like a call from the nurse.

## 2020-05-19 NOTE — Telephone Encounter (Signed)
Called and spoke to [facility] and answered clinical questions regarding PA for Pradaxa.  Awaiting determination.

## 2020-05-19 NOTE — Telephone Encounter (Signed)
[employer] is asking for a call for clinical info re: pt's PRADAXA 150 MG CAPS capsule they are asking their provider line be called at [phone] option 5 they are asking for other meds taken for diagnosis and for the diagnosis code.

## 2020-05-20 ENCOUNTER — Telehealth: Payer: Self-pay

## 2020-05-20 NOTE — Telephone Encounter (Signed)
Manual transmission received and reviewed with industry rep.  Normal device function for device programmed VVIR.  Pt HR elevated to 110pm based on sensor.  No true arrhythmias have been logged.  Nothing on device to explain pt BP, forwarding to general cardiology triage.

## 2020-05-20 NOTE — Telephone Encounter (Signed)
Spoke with pt and pt's  husband, DPR who reports pt's BP gradually increasing since stopping am Metoprolol per Chanetta Marshall, NP.  Pt states she restarted am Metoprolol 3 days ago.  Pt's husband states BP at 1045 was 157/99, 1145am - 188/125 HR - 111 and 2pm - 168/91 HR 82.  Pt reports she feels jittery since pacemaker settings changed and would like to have settings changed back.  Pt denies any additional symptoms at this time.   Advised pt BP at OV 01/20 was 142/90 with HR of 80.  Discussed monitoring Na+ and caffeine intake.  Continue to monitor BP daily 2 hours after morning medications if you remain asymptomatic.  Discussed having stopped the Metoprolol would cause elevation in BP and HR as well as can add to feeling jittery as it can also lower feelings of anxiousness. RN will discuss with Device clinic RN and route information for review to Tommye Standard, PA-C as Chanetta Marshall, NP and Dr Caryl Comes are currently out of the office.

## 2020-05-20 NOTE — Telephone Encounter (Signed)
Spoke with pt and pt's husband again after talking with Trena Platt, Device RN.  Advised PPM changes made by Chanetta Marshall on 05/14/2020 should not affect BP or HR. Pt requests device appointment be scheduled 05/26/2020 at 8am to change settings.  Advised pt's information has been sent to provider's for review.  Reiterated decrease in Metoprolol would most likely be the cause of increased BP, HR and feeling jittery or nervous. Reviewed ED precautions.  Pt and pt's husband verbalize understanding and agree with current plan.

## 2020-05-20 NOTE — Telephone Encounter (Signed)
[employer] called to inform of the approval on the Pradaxa.  Effective date 05-15-20 to 05-15-21 if there are questions a call back # is [phone] option 5

## 2020-05-20 NOTE — Telephone Encounter (Signed)
The pt husband [name] (dpr) states the patient blood pressure has been higher than normal. 157/99 bpm. I asked was there any other symptoms and he states no. I told him I will have the nurse review the transmission and give them a call back. I let them know if the nurse do not see anything she may let them know to call the general cardiologist. Transmission received.

## 2020-05-26 ENCOUNTER — Other Ambulatory Visit: Payer: Self-pay

## 2020-05-26 ENCOUNTER — Ambulatory Visit (INDEPENDENT_AMBULATORY_CARE_PROVIDER_SITE_OTHER): Payer: Medicare Other | Admitting: Emergency Medicine

## 2020-05-26 ENCOUNTER — Telehealth: Payer: Self-pay | Admitting: Emergency Medicine

## 2020-05-26 DIAGNOSIS — I4430 Unspecified atrioventricular block: Secondary | ICD-10-CM

## 2020-05-26 DIAGNOSIS — I4819 Other persistent atrial fibrillation: Secondary | ICD-10-CM

## 2020-05-26 LAB — CUP PACEART INCLINIC DEVICE CHECK
Brady Statistic RV Percent Paced: 99 %
Date Time Interrogation Session: 20220201170505
Implantable Lead Implant Date: 20211206
Implantable Lead Implant Date: 20211206
Implantable Lead Location: 753858
Implantable Lead Location: 753860
Implantable Lead Model: 3830
Implantable Lead Model: 5076
Implantable Pulse Generator Implant Date: 20211206
Lead Channel Pacing Threshold Amplitude: 1 V
Lead Channel Pacing Threshold Pulse Width: 0.4 ms
Lead Channel Sensing Intrinsic Amplitude: 7 mV
Lead Channel Sensing Intrinsic Amplitude: 7.4 mV

## 2020-05-26 MED ORDER — METOPROLOL TARTRATE 25 MG PO TABS: 12.5000 mg | ORAL_TABLET | Freq: Every evening | ORAL | 0 refills | Status: DC

## 2020-05-26 NOTE — Telephone Encounter (Signed)
Attempted phone call to pt.  OK to leave detailed message on answering machine per Epic note.  Pt advised Metoprolol Rx has been sent to pharmacy and pt will need to call back to [phone] to schedule appointment with APP for HTN per Dr Acie Fredrickson.

## 2020-05-26 NOTE — Progress Notes (Signed)
Device checked in-clinic by industry. See scanned report.  Changes made to session:  Rate Profile Optimization reprogrammed from off to on. ADL Setpoint programmed from 6 to 10. Activity Threshold programmed from Low to Medium Low.

## 2020-05-26 NOTE — Telephone Encounter (Signed)
Patient seen in [facility] today. Device showing normal function. Patient states that she will need a refill on her metoprolol if she is to continue taking it because she is out of her medication. Advised patient this information would be sent to [facility] and Dr. Caryl Comes for advisement in reference to medication refill for metoprolol.

## 2020-05-26 NOTE — Telephone Encounter (Signed)
-----   Message from Thayer Headings, MD sent at 05/26/2020  9:21 AM EST ----- Please refill Ms. Staffords Metoprolol 25 mg tabs. She is to take 12.5 mg at night  Please also make an appt with an APP to see her for office visit for HTN  She will be getting a new BP cuff .  Thanks   [employer]

## 2020-05-26 NOTE — Patient Instructions (Addendum)
Device checked in office. Device function normal. Manual blood pressure taken in office. Manual blood pressure in office was 142/82 manually.

## 2020-05-27 NOTE — Progress Notes (Signed)
CARDIOLOGY OFFICE NOTE  Date:  06/10/2020    Mary Mercado Date of Birth: 1940-08-20 Medical Record [id number]  PCP:  [name], MD  Cardiologist:  Nahser & Caryl Comes    Chief Complaint  Patient presents with  . Follow-up    Seen for Dr. Acie Fredrickson and Caryl Comes    History of Present Illness: Mary Mercado is a 80 y.o. female who presents today for a follow up visit. Seen for Dr. Tamala Julian and Caryl Comes.   He has a history of PAF - CHADSVASC of 5; she has had prior cardioversion in July of 2020, again in September of 2021 and then a CVA the following day while in our nuclear department. CT scan showed a chronic infarct of the right frontal parietal cortex with mimcrovascular ischemic changes in the white matter. 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 Eliquis switched to Pradaxa. CCB has been increased. She has been seen in the AF clinic as well. Noted discussion about TEE prior to any other cardioversion attempts. She failed on outpatient Flecainide therapy. She subsequently had an ablation and had PPM placement in December of 2021.   Last seen by Dr. Acie Fredrickson in October of 2021. She was seen by Chanetta Marshall last month for her device follow up.    She called earlier this month with dizziness and blurred vision.   Comes in today. Here with her husband. Little hard to follow her history. She notes her BP has been up. She is back on Diltiazem - this makes her dizzy - has done this to her in the past. Her husband was seen earlier this month by Dr. Acie Fredrickson and they talked about her high BP and the CCB was restarted then. Had seen EP last month - Metoprolol cut back at that time.  Sounds like her pacing rate was changed and lowered. On low salt diet. Does not eat out. Rarely getting take out. She notes she is dizzy - they both attribute this to the diltiazem. With her stroke she felt "her head was unsettled".  This is different. She has no chest pain.  She admits she gets worried when the readings are high. BP has been 156/100 and overall looks to be in the 123456 systolic. They would like to change medicines. Last renal function normal.   Past Medical History:  Diagnosis Date  . Allergy   . CVA (cerebral vascular accident) ([place]) 05/2018, 01/09/20  . Hyperlipidemia LDL goal <70   . Hypertension   . Palpitations   . Vertigo, benign positional     Past Surgical History:  Procedure Laterality Date  . AV NODE ABLATION N/A 03/30/2020   Procedure: AV NODE ABLATION;  Surgeon: Deboraha Sprang, MD;  Location: [place] CV LAB;  Service: Cardiovascular;  Laterality: N/A;  . AV NODE ABLATION N/A 03/30/2020   Procedure: AV NODE ABLATION;  Surgeon: Deboraha Sprang, MD;  Location: [facility] CV LAB;  Service: Cardiovascular;  Laterality: N/A;  . CARDIOVERSION Left 10/31/2018   Procedure: CARDIOVERSION;  Surgeon: Thayer Headings, MD;  Location: [place];  Service: Cardiovascular;  Laterality: Left;  . CARDIOVERSION N/A 01/08/2020   Procedure: CARDIOVERSION;  Surgeon: [name], MD;  Location: [facility] ENDOSCOPY;  Service: Cardiovascular;  Laterality: N/A;  . Hemilaminectomy and microdiskectomy at L4-5 on the left.  12/10/2003  . PACEMAKER IMPLANT N/A 03/30/2020   Procedure: PACEMAKER IMPLANT;  Surgeon: Deboraha Sprang, MD;  Location: [facility] INVASIVE CV  LAB;  Service: Cardiovascular;  Laterality: N/A;  . TEE WITHOUT CARDIOVERSION N/A 06/05/2018   Procedure: TRANSESOPHAGEAL ECHOCARDIOGRAM (TEE);  Surgeon: Lelon Perla, MD;  Location: [facility] ENDOSCOPY;  Service: Cardiovascular;  Laterality: N/A;  loop     Medications: Current Meds  Medication Sig  . atorvastatin (LIPITOR) 80 MG tablet TAKE 1 TABLET (80 MG TOTAL) BY MOUTH DAILY AT 6 PM.  . diltiazem (CARDIZEM CD) 240 MG 24 hr capsule Take 1 capsule (240 mg total) by mouth daily.  Marland Kitchen diltiazem (CARDIZEM) 30 MG tablet Take 1 tablet (30 mg total) by mouth 4 (four) times daily as needed. Prn  . KLOR-CON  M10 10 MEQ tablet TAKE 1 TABLET BY MOUTH EVERY DAY  . metoprolol tartrate (LOPRESSOR) 25 MG tablet Take 0.5 tablets (12.5 mg total) by mouth at bedtime.  Marland Kitchen PRADAXA 150 MG CAPS capsule TAKE 1 CAPSULE (150 MG TOTAL) BY MOUTH EVERY 12 (TWELVE) HOURS.     Allergies: Allergies  Allergen Reactions  . Duloxetine     Causes Bp elevation.  Increased body jerks, nausea  . Acetaminophen Hives and Rash    Social History: The patient  reports that she has never smoked. She has never used smokeless tobacco. She reports that she does not drink alcohol and does not use drugs.   Family History: The patient's family history includes Colon cancer in her father; Diabetes in her mother; Heart attack in her mother; Heart attack (age of onset: 27) in her brother.   Review of Systems: Please see the history of present illness.   All other systems are reviewed and negative.   Physical Exam: VS:  BP (!) 142/78   Pulse 73   Ht 5\' 5"  (1.651 m)   Wt 127 lb 9.6 oz (57.9 kg)   SpO2 91%   BMI 21.23 kg/m  .  BMI Body mass index is 21.23 kg/m.  Wt Readings from Last 3 Encounters:  06/10/20 127 lb 9.6 oz (57.9 kg)  06/03/20 128 lb (58.1 kg)  05/14/20 130 lb 12.8 oz (59.3 kg)    General: Pleasant. Seems a little nervous - she is in no acute distress. She is shaky.   Cardiac: Regular rate and rhythm - presumed paced. The pacemaker in the left upper chest looks ok - there is some adhesive there that was removed.  No edema.  Respiratory:  Lungs are clear to auscultation bilaterally with normal work of breathing.  GI: Soft and nontender.  MS: No deformity or atrophy. Gait and ROM intact.  Skin: Warm and dry. Color is normal.  Neuro:  Strength and sensation are intact and no gross focal deficits noted.  Psych: Alert, appropriate and with normal affect.   LABORATORY DATA:  EKG:  EKG is ordered today.  Personally reviewed by me. This demonstrates VP pacing - lots of artifact - probable underlying AF.   Lab  Results  Component Value Date   WBC 7.8 03/18/2020   HGB 15.1 03/18/2020   HCT 44.6 03/18/2020   PLT 280 03/18/2020   GLUCOSE 94 03/18/2020   CHOL 183 01/10/2020   TRIG 52 01/10/2020   HDL 92 01/10/2020   LDLCALC 81 01/10/2020   ALT 20 01/15/2020   AST 19 01/15/2020   NA 140 03/18/2020   K 4.4 03/18/2020   CL 101 03/18/2020   CREATININE 0.75 03/18/2020   BUN 11 03/18/2020   CO2 26 03/18/2020   TSH 1.48 11/14/2019   INR 1.3 (H) 01/09/2020   HGBA1C 5.9 (H)  01/10/2020     BNP (last 3 results) No results for input(s): BNP in the last 8760 hours.  ProBNP (last 3 results) No results for input(s): PROBNP in the last 8760 hours.   Other Studies Reviewed Today:  ECHO IMPRESSIONS 12/2019  1. Left ventricular ejection fraction, by estimation, is 60 to 65%. The  left ventricle has normal function. The left ventricle has no regional  wall motion abnormalities. Left ventricular diastolic parameters are  consistent with Grade II diastolic  dysfunction (pseudonormalization). Elevated left atrial pressure.  2. Right ventricular systolic function is normal. The right ventricular  size is normal. There is moderately elevated pulmonary artery systolic  pressure. The estimated right ventricular systolic pressure is 73.7 mmHg.  3. Left atrial size was severely dilated.  4. Right atrial size was severely dilated.  5. The mitral valve is degenerative. Mild mitral valve regurgitation. No  evidence of mitral stenosis.  6. Tricuspid valve regurgitation is moderate to severe.  7. The aortic valve is tricuspid. There is mild calcification of the  aortic valve. There is mild thickening of the aortic valve. Aortic valve  regurgitation is not visualized. Mild to moderate aortic valve  sclerosis/calcification is present, without any  evidence of aortic stenosis.  8. The inferior vena cava is normal in size with <50% respiratory  variability, suggesting right atrial pressure of 8 mmHg.      ASSESSMENT & PLAN:     1. HTN - wishes to come off Diltiazem long acting - will stop this - start Losartan 50 mg a day - first dose tonight. Stop potassium. BMET today. She will monitor her BP for [place].    2. Prior stroke - switched over to Pradaxa. No problems noted.   3. Persistent AF - prior cardioversions - subsequent ablation with PPM implant from December 2021.    4. Chronic anticoagulation - no problems noted. CHADSVASC of at least 6.    Current medicines are reviewed with the patient today.  The patient does not have concerns regarding medicines other than what has been noted above.  The following changes have been made:  See above.  Labs/ tests ordered today include:    Orders Placed This Encounter  Procedures  . EKG 12-Lead     Disposition:   FU with Dr. Caryl Comes next month as planned. See Dr. Acie Fredrickson in about 3 months. BMET today.    Patient is agreeable to this plan and will call if any problems develop in the interim.   SignedTruitt Merle, NP  06/10/2020 11:32 AM  [place] [address] [place], [place]  [address] Phone: [phone] Fax: [phone]

## 2020-05-29 ENCOUNTER — Telehealth: Payer: Self-pay | Admitting: Interventional Cardiology

## 2020-05-29 MED ORDER — DILTIAZEM HCL ER COATED BEADS 240 MG PO CP24: 240.0000 mg | ORAL_CAPSULE | Freq: Every day | ORAL | 3 refills | Status: DC

## 2020-05-29 NOTE — Telephone Encounter (Signed)
° °  Pt c/o BP issue: STAT if pt c/o blurred vision, one-sided weakness or slurred speech  1. What are your last 5 BP readings? 161/99  2. Are you having any other symptoms (ex. Dizziness, headache, blurred vision, passed out)? Vision gets blurry when BP is high  3. What is your BP issue? Pt said he BP been elevated, she said her eyes gets blurry every time her BP is high. She wanted to check in with Dr. Tamala Julian if she needs to be seen sooner

## 2020-05-29 NOTE — Telephone Encounter (Addendum)
Spoke with the patient who states she has been experiencing dizziness when her BP is elevated over the past several days.   Current BP of 161/99  Yesterday afternoon BP of 157/88  Patient states that her vision becomes blurry and she knows that her BP is elevated. She denies any other symptoms at this time. Denies CP, SOB or dizziness. States the only thing to make blurred vision stop is if she lays down.  She took 2 PRN doses of 30 mg diltiazem yesterday with little relief.  Reviewed by Dr. Acie Fredrickson who advised patient to restart her Cardizem CD 240 mg 24hr capsule-once daily. Patient made aware. New Rx sent. Advised patient to continue monitoring BP daily, keep a record and call back with any new or worsening concerns.

## 2020-06-03 ENCOUNTER — Ambulatory Visit (INDEPENDENT_AMBULATORY_CARE_PROVIDER_SITE_OTHER): Payer: Medicare Other | Admitting: Adult Health

## 2020-06-03 ENCOUNTER — Encounter: Payer: Self-pay | Admitting: Adult Health

## 2020-06-03 VITALS — BP 147/86 | HR 71 | Ht 65.0 in | Wt 128.0 lb

## 2020-06-03 DIAGNOSIS — G4730 Sleep apnea, unspecified: Secondary | ICD-10-CM | POA: Diagnosis not present

## 2020-06-03 DIAGNOSIS — I639 Cerebral infarction, unspecified: Secondary | ICD-10-CM

## 2020-06-03 DIAGNOSIS — Z8673 Personal history of transient ischemic attack (TIA), and cerebral infarction without residual deficits: Secondary | ICD-10-CM

## 2020-06-03 NOTE — Progress Notes (Signed)
I agree with the above plan 

## 2020-06-03 NOTE — Patient Instructions (Addendum)
Continue to follow with cardiology regarding continued uncontrolled blood pressure  Continue Pradaxa (dabigatran) twice a day  and atorvastatin  for secondary stroke prevention  Please ensure you schedule follow up appointment to undergo titration study for sleep apnea  Continue to follow up with PCP regarding cholesterol and blood pressure management  Maintain strict control of hypertension with blood pressure goal below 130/90 and cholesterol with LDL cholesterol (bad cholesterol) goal below 70 mg/dL.     Followup in the future with me in 6 months or call earlier if needed     Thank you for coming to see [place] at [facility]. I hope we have been able to provide you high quality care today.  You may receive a patient satisfaction survey over the next few weeks. We would appreciate your feedback and comments so that we may continue to improve ourselves and the health of our patients.

## 2020-06-03 NOTE — Progress Notes (Signed)
[facility] [address] [place]. [place] [address] [phone]       OFFICE FOLLOW-UP NOTE  [name] Date of Birth:  1940/06/16 Medical Record Number:  [id number]   Reason for visit: Stroke follow-up  Chief complaint: Chief Complaint  Patient presents with  . Follow-up    RM 14 with (bobby) husband Pt is well, has some dizziness.       HPI:   Today, 06/03/2020, Mary Mercado returns for 56-month stroke follow-up.  She has been stable from a stroke standpoint since prior visit without new stroke/TIA symptoms and reports residual gait unsteadiness with imbalance and dysarthria. She continues to use rolling walker and denies any recent falls. She also reports chronic left leg and torso numbness with occasional burning sensation which is chronic and denies worsening. She has remained on Pradaxa and atorvastatin 80 mg daily without side effects.  Blood pressure today 147/86.  She has routinely followed with cardiology for continued dizziness which she reports is in relation to blood pressure and cardiology currently working on adjustments.  She was evaluated by Dr. Rexene Alberts and was found to have severe sleep apnea. Recommended titration study but patient requested holding off until further recovery from undergoing ablation for persistent A. 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 She is not ready to proceed with titration study. No further concerns at this time.    History provided for reference purposes only Hospital follow-up 02/03/2020 JM: She was discharged home from [facility] on 01/22/2020 after an 8-day stay.  Since discharge, she reports residual " whooshy head" sensation, gait unsteadiness and mild dysarthria but reports ongoing improvement.  Recommended outpatient PT/OT/SLP at discharge from CIR but referral was not placed at discharge.  She has continued to do exercises at home as recommended during CIR stay.  Continues to use Rollator walker for  ambulation and denies any recent falls.  Denies vertigo or dizziness sensation.  Denies new or worsening stroke/TIA symptoms.  Prior stroke deficit of left-sided paresthesias greatly improving and only experience occasional numbness/tingling.  Remains on Pradaxa without bleeding or bruising.  Remains on atorvastatin 80 mg daily without myalgias.  Blood pressure today 122/76.  Monitors at home which has been stable.  She does report urinary frequency over the past week and is concerned for possible UTI but has been increasing water intake and drinking cranberry juice.  Denies fever, dysuria or hematuria.  Typically, nocturia only on occasion but does report snoring, daytime naps and occasional insomnia.  She has not previously underwent sleep study.  No further concerns at this time.  Stroke admission 01/09/2020 Presented to ER with slurred speech, facial droop vertigo and ataxia while at her cardiologist office the morning of 01/09/2020.  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  Switched anticoagulation to Pradaxa.  HTN stable.  LDL 81 and increase atorvastatin from 40 mg to 80 mg daily.  Other stroke risk factors include advanced age and history of right MCA stroke in 05/2018.  Evaluated by therapy with residual dysarthria, mild memory deficits, vestibular symptoms and imbalance affecting ADLs and mobility therefore discharged to CIR with functional decline.  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 Old infarct R frontoparietal cortex. Small vessel disease. ASPECTS 10.     CTA head & neck no LVO. B ICA bifurcation atherosclerosis, supraclinoid B ICA atherosclerosis. L VA origin  moderate to severe stenosis. R P2 moderate stenosis.   MRI  R superior cerebellar territory infarct. Evolution of prior R MCA infarcts from last year. Small vessel disease.   2D Echo EF 60-65%.  No source of embolus. LA severely dilated.  LDL 81  HgbA1c 5.9  VTE prophylaxis - Lovenox 40 mg sq daily   Eliquis (apixaban) daily prior to admission, now on aspirin 81 mg daily.   Switched to Pradaxa  Therapy recommendations:   CIR  Disposition:   CIR       History provided for reference purposes only Update 08/01/2019 JM: Mary Mercado is a 80 year old female who is being seen today, 08/01/2019, for stroke follow-up with residual left-sided paresthesias and post stroke anxiety accompanied by her husband.  Initiated Cymbalta at prior visit due to ongoing paresthesias and severe anxiety but unfortunately caused hypertension therefore advised to discontinue. Paresthesias have been stable from prior visit without worsening with intermittent symptoms that occur randomly.  She does continue to have occasional balance difficulties and if ambulating or standing for too long she will start to feel "off".  Referral placed at prior visit to PT but apparently has not been called to schedule initial evaluation.  She continues to experience anxiety which has only been present for stroke but does feel slight improvement since prior visit.  Remains on Eliquis and atorvastatin without side effects.  Blood pressure today 132/74.  No further concerns at this time.  Update 05/02/2019: Mary Mercado is a 80 year old female who is being seen today for stroke follow-up accompanied by her husband.  Residual deficits left-sided paresthesias consisting of numbness/tingling and occasional burning sensation.  She also endorses episodes of sensation of full body "intermittently shaking" and is usually worsened by stress, anxiety or fatigue.  Usually accompanied by nausea but denies headache or dizziness.  She becomes fearful during those times that she may be having a heart attack.  When questioned regarding anxiety, she does endorse minimal anxiety but after doing GAD-7 test, score of 20/21 showing severe anxiety.  She denies  any prior history or family history of depression or anxiety.  She continues on Eliquis without bleeding or bruising.  She continues on atorvastatin without myalgias.  Blood pressure today 140/81.  She denies any additional episodes of dizziness and continues to follow with cardiology for blood pressure management.  She has recently decreased telmisartan as it is recommended to continue blood pressure between 130-150 due to vertebral artery stenosis.  No further concerns at this time.   Update 11/19/2018 Dr. Leonie Man: She is seen today for office follow-up visit following initial video follow-up visit on 08/13/2018.  She is accompanied by her husband.  She states she continues to have left leg paresthesias and numbness.  She has some mild gait and balance difficulties.  At times she noticed that the legs are trembling and she is initiating walk and she has to hold on something and the feeling goes away.  She has also been started on several new cardiac medications per her ablation for A. fib and feels medication may be the cause for her dizziness.  She is tolerating Eliquis well without bruising or bleeding.  Blood pressures well controlled and today it is 136/79.  She remains on Lipitor which is tolerating well without muscle aches and pains.  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  Initial video visit 08/13/2018 Dr. Leonie Man : This is  a initial video virtual consultation visit on Mary Mercado who was admitted to [facility] in February 2020 with a stroke.  I have personally obtained history of presenting illness from the patient and her husband and reviewed electronic medical records as well as imaging films in PACS.  She was admitted on 05/31/2018 with sudden onset of left arm and leg weakness and numbness as well as feeling dizzy and had one episode of emesis.  She had CT scan and CT angiogram in the ER which showed no large  vessel occlusion she was given IV TPA and admitted to the neurological intensive care unit.  NIH stroke scale on admission was 4.  At baseline modified Rankin score was 0.  Patient had tight blood pressure control.  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  Transthoracic echo showed normal ejection fraction.  Transesophageal echocardiogram showed no cardiac source of embolism or PFO.  LDL cholesterol was elevated at 1 1 7  mg percent.  Hemoglobin A1c was 5.9.  Urine drug screen was negative.  Lower extremity venous Dopplers were negative for DVT.  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  Patient was started on aspirin 81 and Plavix 75 mg daily for 3 weeks and underwent an outpatient 30-day heart monitor which subsequently showed paroxysmal atrial fibrillation.  She has since then stopped aspirin and Plavix and has been switched to Eliquis.  Patient states she has done well since discharge.  She was initially transferred to inpatient rehab where she stayed for a few weeks and did well.  She is still has left-sided numbness but feels some of the sensation may be coming back now.  She can walk independently without assistance though her husband stays close by.  She has had no falls or injuries.  She is tolerating Eliquis well without bleeding or bruising.  She is also tolerating Lipitor well.  She complains of little bit of jitteriness and tiredness but she blames this likely on her new A. fib medication.  She has no new complaints.  She has no prior history of strokes TIAs.       ROS:   14 system review of systems is positive for those listed in HPI and all other systems negative  PMH:  Past Medical History:  Diagnosis Date  .  Allergy   . CVA (cerebral vascular accident) ([place]) 05/2018, 01/09/20  . Hyperlipidemia LDL goal <70   . Hypertension   . Palpitations   . Vertigo, benign positional     Social History:  Social History   Socioeconomic History  . Marital status: Married    Spouse name: [name]  . Number of children: Not on file  . Years of education: 12+  . Highest education level: Not on file  Occupational History    Comment: retired  Tobacco Use  . Smoking status: Never Smoker  . Smokeless tobacco: Never Used  Vaping Use  . Vaping Use: Never used  Substance and Sexual Activity  . Alcohol use: Never  . Drug use: Never  . Sexual activity: Not on file  Other Topics Concern  . Not on file  Social History Narrative   Lives with husband   caffeine none now   Social Determinants of Health   Financial Resource Strain:  Not on file  Food Insecurity: Not on file  Transportation Needs: Not on file  Physical Activity: Not on file  Stress: Not on file  Social Connections: Not on file  Intimate Partner Violence: Not on file    Medications:   Current Outpatient Medications on File Prior to Visit  Medication Sig Dispense Refill  . atorvastatin (LIPITOR) 80 MG tablet TAKE 1 TABLET (80 MG TOTAL) BY MOUTH DAILY AT 6 PM. 30 tablet 11  . diltiazem (CARDIZEM CD) 240 MG 24 hr capsule Take 1 capsule (240 mg total) by mouth daily. 90 capsule 3  . diltiazem (CARDIZEM) 30 MG tablet Take 1 tablet (30 mg total) by mouth 4 (four) times daily as needed. Prn (Patient taking differently: Take 30 mg by mouth 4 (four) times daily as needed (Increase heart rate).) 120 tablet 11  . KLOR-CON M10 10 MEQ tablet TAKE 1 TABLET BY MOUTH EVERY DAY 90 tablet 3  . metoprolol tartrate (LOPRESSOR) 25 MG tablet Take 0.5 tablets (12.5 mg total) by mouth at bedtime. 45 tablet 0  . PRADAXA 150 MG CAPS capsule TAKE 1 CAPSULE (150 MG TOTAL) BY MOUTH EVERY 12 (TWELVE) HOURS. 60 capsule 11   Current Facility-Administered Medications on  File Prior to Visit  Medication Dose Route Frequency Provider Last Rate Last Admin  . regadenoson (LEXISCAN) injection SOLN 0.4 mg  0.4 mg Intravenous Once Buford Dresser, MD      . technetium tetrofosmin (TC-MYOVIEW) injection 32 millicurie  32 millicurie Intravenous Once PRN Buford Dresser, MD        Allergies:   Allergies  Allergen Reactions  . Duloxetine     Causes Bp elevation.  Increased body jerks, nausea  . Acetaminophen Hives and Rash    Physical Exam  Today's Vitals   06/03/20 1331  BP: (!) 147/86  Pulse: 71  Weight: 128 lb (58.1 kg)  Height: 5\' 5"  (1.651 m)   Body mass index is 21.3 kg/m.   General: frail pleasant elderly Caucasian lady seated, in no evident distress Head: head normocephalic and atraumatic.  Neck: supple with no carotid or supraclavicular bruits Cardiovascular: irregular rate and rhythm, no murmurs Musculoskeletal: no deformity Skin:  no rash/petichiae Vascular:  Normal pulses all extremities   Neurologic Exam Mental Status: Awake and fully alert.  Mild dysarthria with occasional speech hesitancy and word finding difficulty. Oriented to place and time. Recent and remote memory intact. Attention span, concentration and fund of knowledge appropriate. Mood and affect appropriate Cranial Nerves: Pupils equal, briskly reactive to light. Extraocular movements full without nystagmus. Visual fields full to confrontation. Hearing intact. Facial sensation intact. Face, tongue, palate moves normally and symmetrically.  Motor: Normal bulk and tone. Normal strength in all tested extremity muscles except mild left hip flexor weakness and decreased left hand finger dexterity Sensory.:  Intact light touch, vibratory and pinprick sensation Coordination: Rapid alternating movements normal in all extremities except decreased left hand bilaterally. Finger-to-nose and heel-to-shin performed accurately bilaterally.  Mild action tremors bilateral upper  extremities.  No tremors at rest or evidence of cogwheel rigidity or bradykinesia Gait and Station: Arises from chair without difficulty. Stance is normal. Gait demonstrates normal stride length and mild unsteadiness/imbalance with use of Rollator walker. Reflexes: 1+ and symmetric. Toes downgoing.       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  Vascular risk factors of hypertension, hyperlipidemia, A. 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 Also recently diagnosed with  severe sleep apnea currently awaiting to undergo titration study.      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  Atrial fibrillation S/p ablation 03/30/2020 Remains on Pradaxa for CHA2DS2-VASc score of at least 6 monitored and prescribed by cardiology  Severe sleep apnea, new dx -Evaluated by Dr. Rexene Alberts with sleep study completed 03/12/2020 which showed total AHI 44.6 -Recommended pursuing titration study but pt wished until recovery from ablation and pacer placement. She is now ready to proceed with titration study - will reach out to sleep lab to see if they can further assist    Follow-up in 6 months or call earlier as needed  CC:  [facility] provider: Dr. Lorain Childes, Ishmael Holter, MD    I spent 30 minutes of face-to-face and non-face-to-face  time with patient and husband.  This included previsit chart review, lab review, study review, order entry, electronic health record documentation, patient education regarding prior stroke with residual deficits, importance of ongoing management of stroke risk factors as well as new diagnosis of sleep apnea and answered all other questions to patient and husband satisfaction   Frann Rider, AGNP-BC  [facility] [address] [place], [place] [address]  Phone [phone] Fax [phone] Note: This document was prepared with digital dictation and possible smart phrase technology. Any transcriptional errors that result from this process are unintentional.

## 2020-06-05 ENCOUNTER — Other Ambulatory Visit (HOSPITAL_COMMUNITY): Payer: Self-pay | Admitting: Nurse Practitioner

## 2020-06-05 MED ORDER — DILTIAZEM HCL ER COATED BEADS 240 MG PO CP24
240.0000 mg | ORAL_CAPSULE | Freq: Every day | ORAL | 2 refills | Status: DC
Start: 2020-06-05 — End: 2020-06-10

## 2020-06-10 ENCOUNTER — Ambulatory Visit (INDEPENDENT_AMBULATORY_CARE_PROVIDER_SITE_OTHER): Payer: Medicare Other | Admitting: Nurse Practitioner

## 2020-06-10 ENCOUNTER — Other Ambulatory Visit: Payer: Self-pay

## 2020-06-10 ENCOUNTER — Encounter: Payer: Self-pay | Admitting: Nurse Practitioner

## 2020-06-10 VITALS — BP 142/78 | HR 73 | Ht 65.0 in | Wt 127.6 lb

## 2020-06-10 DIAGNOSIS — I4819 Other persistent atrial fibrillation: Secondary | ICD-10-CM | POA: Diagnosis not present

## 2020-06-10 DIAGNOSIS — I447 Left bundle-branch block, unspecified: Secondary | ICD-10-CM

## 2020-06-10 DIAGNOSIS — I639 Cerebral infarction, unspecified: Secondary | ICD-10-CM

## 2020-06-10 DIAGNOSIS — R42 Dizziness and giddiness: Secondary | ICD-10-CM | POA: Diagnosis not present

## 2020-06-10 LAB — BASIC METABOLIC PANEL
BUN/Creatinine Ratio: 13 (ref 12–28)
BUN: 10 mg/dL (ref 8–27)
CO2: 19 mmol/L — ABNORMAL LOW (ref 20–29)
Calcium: 9.6 mg/dL (ref 8.7–10.3)
Chloride: 100 mmol/L (ref 96–106)
Creatinine, Ser: 0.75 mg/dL (ref 0.57–1.00)
GFR calc Af Amer: 86 mL/min/{1.73_m2} (ref >59)
GFR calc non Af Amer: 75 mL/min/{1.73_m2} (ref >59)
Glucose: 128 mg/dL — ABNORMAL HIGH (ref 65–99)
Potassium: 4.4 mmol/L (ref 3.5–5.2)
Sodium: 139 mmol/L (ref 134–144)

## 2020-06-10 MED ORDER — LOSARTAN POTASSIUM 50 MG PO TABS: 50.0000 mg | ORAL_TABLET | Freq: Every day | ORAL | 3 refills | Status: DC

## 2020-06-10 NOTE — Patient Instructions (Addendum)
After Visit Summary:  We will be checking the following labs today - BMET   Medication Instructions:    Continue with your current medicines. BUT  We are stopping the long acting Diltiazem  We are stopping the Potassium  We are start Losartan 50 mg a day - take first dose tonight and then daily   If you need a refill on your cardiac medications before your next appointment, please call your pharmacy.     Testing/Procedures To Be Arranged:  N/A  Follow-Up:   See Dr. Caryl Comes next month as planned.  See Dr. Acie Fredrickson in 3 months     At [facility], you and your health needs are our priority.  As part of our continuing mission to provide you with exceptional heart care, we have created designated Provider Care Teams.  These Care Teams include your primary Cardiologist (physician) and Advanced Practice Providers (APPs -  Physician Assistants and Nurse Practitioners) who all work together to provide you with the care you need, when you need it.  Special Instructions:  . Stay safe, wash your hands for at least 20 seconds and wear a mask when needed.  . It was good to talk with you today.  Marland Kitchen Keep a check on your blood pressure for [place].    Call the [place] office at [phone] if you have any questions, problems or concerns.

## 2020-06-22 ENCOUNTER — Ambulatory Visit (INDEPENDENT_AMBULATORY_CARE_PROVIDER_SITE_OTHER): Payer: Medicare Other | Admitting: Neurology

## 2020-06-22 ENCOUNTER — Other Ambulatory Visit: Payer: Self-pay

## 2020-06-22 DIAGNOSIS — G4720 Circadian rhythm sleep disorder, unspecified type: Secondary | ICD-10-CM

## 2020-06-22 DIAGNOSIS — Z8673 Personal history of transient ischemic attack (TIA), and cerebral infarction without residual deficits: Secondary | ICD-10-CM

## 2020-06-22 DIAGNOSIS — G4733 Obstructive sleep apnea (adult) (pediatric): Secondary | ICD-10-CM

## 2020-06-22 DIAGNOSIS — I4819 Other persistent atrial fibrillation: Secondary | ICD-10-CM

## 2020-06-22 DIAGNOSIS — G4731 Primary central sleep apnea: Secondary | ICD-10-CM

## 2020-06-22 DIAGNOSIS — I639 Cerebral infarction, unspecified: Secondary | ICD-10-CM

## 2020-06-22 DIAGNOSIS — R9431 Abnormal electrocardiogram [ECG] [EKG]: Secondary | ICD-10-CM

## 2020-06-22 DIAGNOSIS — Z9989 Dependence on other enabling machines and devices: Secondary | ICD-10-CM

## 2020-06-22 DIAGNOSIS — R351 Nocturia: Secondary | ICD-10-CM

## 2020-06-26 ENCOUNTER — Encounter: Payer: Medicare Other | Admitting: Physical Medicine & Rehabilitation

## 2020-06-28 ENCOUNTER — Other Ambulatory Visit: Payer: Self-pay

## 2020-06-28 ENCOUNTER — Emergency Department (HOSPITAL_COMMUNITY): Payer: Medicare Other

## 2020-06-28 ENCOUNTER — Observation Stay (HOSPITAL_COMMUNITY): Payer: Medicare Other

## 2020-06-28 ENCOUNTER — Observation Stay (HOSPITAL_COMMUNITY)
Admission: EM | Admit: 2020-06-28 | Discharge: 2020-06-29 | Disposition: A | Discharge status: Home / Self Care | Payer: Medicare Other | Attending: Internal Medicine | Admitting: Internal Medicine

## 2020-06-28 ENCOUNTER — Encounter (HOSPITAL_COMMUNITY): Payer: Self-pay | Admitting: Emergency Medicine

## 2020-06-28 DIAGNOSIS — R1310 Dysphagia, unspecified: Secondary | ICD-10-CM | POA: Diagnosis not present

## 2020-06-28 DIAGNOSIS — I4891 Unspecified atrial fibrillation: Secondary | ICD-10-CM | POA: Diagnosis not present

## 2020-06-28 DIAGNOSIS — Z950 Presence of cardiac pacemaker: Secondary | ICD-10-CM | POA: Insufficient documentation

## 2020-06-28 DIAGNOSIS — I10 Essential (primary) hypertension: Secondary | ICD-10-CM | POA: Diagnosis present

## 2020-06-28 DIAGNOSIS — Z20822 Contact with and (suspected) exposure to covid-19: Secondary | ICD-10-CM | POA: Diagnosis not present

## 2020-06-28 DIAGNOSIS — R569 Unspecified convulsions: Principal | ICD-10-CM

## 2020-06-28 DIAGNOSIS — Z79899 Other long term (current) drug therapy: Secondary | ICD-10-CM | POA: Diagnosis not present

## 2020-06-28 DIAGNOSIS — Z8673 Personal history of transient ischemic attack (TIA), and cerebral infarction without residual deficits: Secondary | ICD-10-CM

## 2020-06-28 DIAGNOSIS — R0902 Hypoxemia: Secondary | ICD-10-CM | POA: Diagnosis not present

## 2020-06-28 DIAGNOSIS — I69354 Hemiplegia and hemiparesis following cerebral infarction affecting left non-dominant side: Secondary | ICD-10-CM

## 2020-06-28 DIAGNOSIS — I6502 Occlusion and stenosis of left vertebral artery: Secondary | ICD-10-CM | POA: Diagnosis not present

## 2020-06-28 DIAGNOSIS — I639 Cerebral infarction, unspecified: Secondary | ICD-10-CM

## 2020-06-28 DIAGNOSIS — I480 Paroxysmal atrial fibrillation: Secondary | ICD-10-CM | POA: Diagnosis present

## 2020-06-28 DIAGNOSIS — M6281 Muscle weakness (generalized): Secondary | ICD-10-CM | POA: Diagnosis not present

## 2020-06-28 DIAGNOSIS — G4489 Other headache syndrome: Secondary | ICD-10-CM | POA: Diagnosis not present

## 2020-06-28 DIAGNOSIS — I6523 Occlusion and stenosis of bilateral carotid arteries: Secondary | ICD-10-CM | POA: Diagnosis not present

## 2020-06-28 DIAGNOSIS — R7303 Prediabetes: Secondary | ICD-10-CM | POA: Diagnosis present

## 2020-06-28 DIAGNOSIS — I6621 Occlusion and stenosis of right posterior cerebral artery: Secondary | ICD-10-CM | POA: Diagnosis not present

## 2020-06-28 DIAGNOSIS — R29810 Facial weakness: Secondary | ICD-10-CM | POA: Diagnosis not present

## 2020-06-28 DIAGNOSIS — R4781 Slurred speech: Secondary | ICD-10-CM | POA: Diagnosis not present

## 2020-06-28 DIAGNOSIS — R29898 Other symptoms and signs involving the musculoskeletal system: Secondary | ICD-10-CM | POA: Diagnosis not present

## 2020-06-28 DIAGNOSIS — R4701 Aphasia: Secondary | ICD-10-CM | POA: Diagnosis present

## 2020-06-28 DIAGNOSIS — E785 Hyperlipidemia, unspecified: Secondary | ICD-10-CM | POA: Diagnosis present

## 2020-06-28 LAB — COMPREHENSIVE METABOLIC PANEL
ALT: 22 U/L (ref 0–44)
AST: 25 U/L (ref 15–41)
Albumin: 3.7 g/dL (ref 3.5–5.0)
Alkaline Phosphatase: 68 U/L (ref 38–126)
Anion gap: 13 (ref 5–15)
BUN: 13 mg/dL (ref 8–23)
CO2: 25 mmol/L (ref 22–32)
Calcium: 9.2 mg/dL (ref 8.9–10.3)
Chloride: 100 mmol/L (ref 98–111)
Creatinine, Ser: 0.7 mg/dL (ref 0.44–1.00)
GFR, Estimated: >60 mL/min (ref >60)
Glucose, Bld: 155 mg/dL — ABNORMAL HIGH (ref 70–99)
Potassium: 4 mmol/L (ref 3.5–5.1)
Sodium: 138 mmol/L (ref 135–145)
Total Bilirubin: 1.4 mg/dL — ABNORMAL HIGH (ref 0.3–1.2)
Total Protein: 6.5 g/dL (ref 6.5–8.1)

## 2020-06-28 LAB — DIFFERENTIAL
Abs Immature Granulocytes: 0.02 10*3/uL (ref 0.00–0.07)
Basophils Absolute: 0.1 10*3/uL (ref 0.0–0.1)
Basophils Relative: 1 %
Eosinophils Absolute: 0.1 10*3/uL (ref 0.0–0.5)
Eosinophils Relative: 2 %
Immature Granulocytes: 0 %
Lymphocytes Relative: 35 %
Lymphs Abs: 2.1 10*3/uL (ref 0.7–4.0)
Monocytes Absolute: 0.6 10*3/uL (ref 0.1–1.0)
Monocytes Relative: 9 %
Neutro Abs: 3.2 10*3/uL (ref 1.7–7.7)
Neutrophils Relative %: 53 %

## 2020-06-28 LAB — RESP PANEL BY RT-PCR (FLU A&B, COVID) ARPGX2
Influenza A by PCR: NEGATIVE
Influenza B by PCR: NEGATIVE
SARS Coronavirus 2 by RT PCR: NEGATIVE

## 2020-06-28 LAB — CBG MONITORING, ED
Glucose-Capillary: 132 mg/dL — ABNORMAL HIGH (ref 70–99)
Glucose-Capillary: 95 mg/dL (ref 70–99)
Glucose-Capillary: 105 mg/dL — ABNORMAL HIGH (ref 70–99)

## 2020-06-28 LAB — I-STAT CHEM 8, ED
BUN: 18 mg/dL (ref 8–23)
Calcium, Ion: 1.04 mmol/L — ABNORMAL LOW (ref 1.15–1.40)
Chloride: 103 mmol/L (ref 98–111)
Creatinine, Ser: 0.7 mg/dL (ref 0.44–1.00)
Glucose, Bld: 148 mg/dL — ABNORMAL HIGH (ref 70–99)
HCT: 47 % — ABNORMAL HIGH (ref 36.0–46.0)
Hemoglobin: 16 g/dL — ABNORMAL HIGH (ref 12.0–15.0)
Potassium: 4.1 mmol/L (ref 3.5–5.1)
Sodium: 139 mmol/L (ref 135–145)
TCO2: 26 mmol/L (ref 22–32)

## 2020-06-28 LAB — APTT: aPTT: 51 seconds — ABNORMAL HIGH (ref 24–36)

## 2020-06-28 LAB — CBC
HCT: 45.8 % (ref 36.0–46.0)
Hemoglobin: 14.6 g/dL (ref 12.0–15.0)
MCH: 29.6 pg (ref 26.0–34.0)
MCHC: 31.9 g/dL (ref 30.0–36.0)
MCV: 92.7 fL (ref 80.0–100.0)
Platelets: 209 10*3/uL (ref 150–400)
RBC: 4.94 MIL/uL (ref 3.87–5.11)
RDW: 13 % (ref 11.5–15.5)
WBC: 6.1 10*3/uL (ref 4.0–10.5)
nRBC: 0 % (ref 0.0–0.2)

## 2020-06-28 LAB — PROTIME-INR
INR: 1.6 — ABNORMAL HIGH (ref 0.8–1.2)
Prothrombin Time: 18.8 seconds — ABNORMAL HIGH (ref 11.4–15.2)

## 2020-06-28 LAB — GLUCOSE, CAPILLARY
Glucose-Capillary: 92 mg/dL (ref 70–99)
Glucose-Capillary: 190 mg/dL — ABNORMAL HIGH (ref 70–99)

## 2020-06-28 LAB — HEMOGLOBIN A1C
Hgb A1c MFr Bld: 6.4 % — ABNORMAL HIGH (ref 4.8–5.6)
Mean Plasma Glucose: 136.98 mg/dL

## 2020-06-28 MED ORDER — DABIGATRAN ETEXILATE MESYLATE 150 MG PO CAPS
150.0000 mg | ORAL_CAPSULE | Freq: Two times a day (BID) | ORAL | Status: DC
  Administered 2020-06-28 – 2020-06-29 (×2): 150 mg via ORAL
  Filled 2020-06-28 (×4): qty 1

## 2020-06-28 MED ORDER — SODIUM CHLORIDE 0.9% FLUSH: 3.0000 mL | Freq: Once | INTRAVENOUS | Status: DC

## 2020-06-28 MED ORDER — ONDANSETRON HCL 4 MG/2ML IJ SOLN: 4.0000 mg | Freq: Four times a day (QID) | INTRAMUSCULAR | Status: DC | PRN

## 2020-06-28 MED ORDER — LORAZEPAM 2 MG/ML IJ SOLN: 1.0000 mg | Freq: Once | INTRAMUSCULAR | Status: AC

## 2020-06-28 MED ORDER — ONDANSETRON HCL 4 MG PO TABS: 4.0000 mg | ORAL_TABLET | Freq: Four times a day (QID) | ORAL | Status: DC | PRN

## 2020-06-28 MED ORDER — SODIUM CHLORIDE 0.9 % IV SOLN
3000.0000 mg | Freq: Once | INTRAVENOUS | Status: AC
  Administered 2020-06-28: 3000 mg via INTRAVENOUS
  Filled 2020-06-28: qty 30

## 2020-06-28 MED ORDER — LORAZEPAM 2 MG/ML IJ SOLN
INTRAMUSCULAR | Status: AC
  Filled 2020-06-28: qty 1

## 2020-06-28 MED ORDER — ATORVASTATIN CALCIUM 80 MG PO TABS
80.0000 mg | ORAL_TABLET | Freq: Every day | ORAL | Status: DC
Start: 2020-06-28 — End: 2020-06-29
  Administered 2020-06-28: 80 mg via ORAL
  Filled 2020-06-28: qty 1

## 2020-06-28 MED ORDER — IOHEXOL 350 MG/ML SOLN
100.0000 mL | Freq: Once | INTRAVENOUS | Status: AC | PRN
  Administered 2020-06-28: 100 mL via INTRAVENOUS

## 2020-06-28 MED ORDER — INSULIN ASPART 100 UNIT/ML ~~LOC~~ SOLN: 0.0000 [IU] | Freq: Three times a day (TID) | SUBCUTANEOUS | Status: DC

## 2020-06-28 MED ORDER — LEVETIRACETAM 500 MG PO TABS
500.0000 mg | ORAL_TABLET | Freq: Two times a day (BID) | ORAL | Status: DC
  Administered 2020-06-28 – 2020-06-29 (×2): 500 mg via ORAL
  Filled 2020-06-28 (×2): qty 1

## 2020-06-28 NOTE — Hospital Course (Addendum)
Mary Mercado is an 80 year old female with PMH seasonal allergies, history of embolic CVA with residual left-sided hemiparesis, hypertension, PAF, history of SVT, glucose intolerance, benign positional vertigo who was brought to the emergency department via EMS due to left arm twitching and associated with right-sided headache that begun when the patient woke up to go to the bathroom and was unable to use her walker normally.   Symptoms were similar to her previous event.   There was concern for seizure activity and she was started on Ativan and Keppra load. She was evaluated by neurology with recommendations for continuing stroke work-up and obtaining EEG. CT head showed no acute stroke.  Chronic right posterior MCA and right cerebellar infarcts were noted. CTA head/neck showed no LVO.  Stable CTA compared to 2020.  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  MRI brain was negative for stroke.  She was recommended to continue on keppra at discharge and will follow up with neurology.

## 2020-06-28 NOTE — Code Documentation (Signed)
Responded to Code Stroke called at 0234 for L sided weakness and headache, [id number]. Pt arrived at 0242, seizing,  NIH-4, CBG-132, CT head-negative for acute changes, chronic R post MCA and R cerebellar infarcts. CTA- no LVO. Pt given 1mg  ativan in CT for seizure. Plan for admission.

## 2020-06-28 NOTE — Progress Notes (Signed)
PROGRESS NOTE    Korri Ask   NGE:[id number]  DOB: 14-Aug-1940  DOA: 06/28/2020     0  PCP: [name], MD  CC: Left arm twitching, headache  Hospital Course: Ms. Petronio is an 80 year old female with PMH seasonal allergies, history of embolic CVA with residual left-sided hemiparesis, hypertension, PAF, history of SVT, glucose intolerance, benign positional vertigo who was brought to the emergency department via EMS due to left arm twitching and associated with right-sided headache that begun when the patient woke up to go to the bathroom and was unable to use her walker normally.   Symptoms were similar to her previous event.   There was concern for seizure activity and she was started on Ativan and Keppra load. She was evaluated by neurology with recommendations for continuing stroke work-up and obtaining EEG. CT head showed no acute stroke.  Chronic right posterior MCA and right cerebellar infarcts were noted. CTA head/neck showed no LVO.  Stable CTA compared to 2020.  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   Interval History:  Seen in the ER at the end of her EEG this morning.  Was awake and alert.  She was oriented to name, year, president, place.  Overall, feels that she is back to her normal self.  Strength is also essentially back to baseline in her left side.  ROS: Constitutional: negative for chills and fevers, Respiratory: negative for cough, Cardiovascular: negative for chest pain and Gastrointestinal: negative for abdominal pain  Assessment & Plan: * New onset seizure ([place]) - concern for underlying old CVA contributing - s/p ativan and Keppra in ER - follow up EEG results - follow up MRI brain  - SLP eval  Hemiplegia and hemiparesis following cerebral infarction affecting left non-dominant side  (HCC) -Weakness seems to be at baseline on exam. -Follow-up PT/OT -So far, home health recommended with 24/7 supervision and she states her husband does take care of her at home  Paroxysmal atrial fibrillation ([place]) -Continue Pradaxa  Prediabetes -Last A1c 5.9% on 01/10/2020 -Continue SSI and CBG monitoring while hospitalized  Hyperlipidemia LDL goal <70 -Continue Lipitor  Essential hypertension - permissive HTN until approx 0200 on 3/7 - BP currently has been low/normal    Old records reviewed in assessment of this patient  Antimicrobials: n/a  DVT prophylaxis:  dabigatran (PRADAXA) capsule 150 mg   Code Status:   Code Status: Full Code Family Communication: none present  Disposition Plan: Status is: Observation  The patient remains OBS appropriate and will d/c before 2 midnights.  Dispo: The patient is from: Home              Anticipated d/c is to: Home              Patient currently is not medically stable to d/c.   Difficult to place patient No  Risk of unplanned readmission score:     Objective: Blood pressure 135/65, pulse 69, temperature 98.6 F (37 C), temperature source Oral, resp. rate 13, height 5\' 6"  (1.676 m), weight 58.3 kg, SpO2 97 %.  Examination: General appearance: alert, cooperative and no distress Head: Normocephalic, without obvious abnormality, atraumatic Eyes: EOMI, PERRL Lungs: clear to auscultation bilaterally Heart: regular rate and rhythm and S1, S2 normal Abdomen: normal findings: bowel sounds normal and soft, non-tender Extremities: no edema Skin: mobility and turgor normal Neurologic: 4+/5  LUE strength; sensation intact throughout; no dysmetria with finger to nose   Consultants:   Neurology  Procedures:   EEG, 06/28/20  Data Reviewed: I have personally reviewed following labs and imaging studies Results for orders placed or performed during the hospital encounter of 06/28/20 (from the past 24 hour(s))  Protime-INR      Status: Abnormal   Collection Time: 06/28/20  2:44 AM  Result Value Ref Range   Prothrombin Time 18.8 (H) 11.4 - 15.2 seconds   INR 1.6 (H) 0.8 - 1.2  APTT     Status: Abnormal   Collection Time: 06/28/20  2:44 AM  Result Value Ref Range   aPTT 51 (H) 24 - 36 seconds  CBC     Status: None   Collection Time: 06/28/20  2:44 AM  Result Value Ref Range   WBC 6.1 4.0 - 10.5 K/uL   RBC 4.94 3.87 - 5.11 MIL/uL   Hemoglobin 14.6 12.0 - 15.0 g/dL   HCT 45.8 36.0 - 46.0 %   MCV 92.7 80.0 - 100.0 fL   MCH 29.6 26.0 - 34.0 pg   MCHC 31.9 30.0 - 36.0 g/dL   RDW 13.0 11.5 - 15.5 %   Platelets 209 150 - 400 K/uL   nRBC 0.0 0.0 - 0.2 %  Differential     Status: None   Collection Time: 06/28/20  2:44 AM  Result Value Ref Range   Neutrophils Relative % 53 %   Neutro Abs 3.2 1.7 - 7.7 K/uL   Lymphocytes Relative 35 %   Lymphs Abs 2.1 0.7 - 4.0 K/uL   Monocytes Relative 9 %   Monocytes Absolute 0.6 0.1 - 1.0 K/uL   Eosinophils Relative 2 %   Eosinophils Absolute 0.1 0.0 - 0.5 K/uL   Basophils Relative 1 %   Basophils Absolute 0.1 0.0 - 0.1 K/uL   Immature Granulocytes 0 %   Abs Immature Granulocytes 0.02 0.00 - 0.07 K/uL  Comprehensive metabolic panel     Status: Abnormal   Collection Time: 06/28/20  2:44 AM  Result Value Ref Range   Sodium 138 135 - 145 mmol/L   Potassium 4.0 3.5 - 5.1 mmol/L   Chloride 100 98 - 111 mmol/L   CO2 25 22 - 32 mmol/L   Glucose, Bld 155 (H) 70 - 99 mg/dL   BUN 13 8 - 23 mg/dL   Creatinine, Ser 0.70 0.44 - 1.00 mg/dL   Calcium 9.2 8.9 - 10.3 mg/dL   Total Protein 6.5 6.5 - 8.1 g/dL   Albumin 3.7 3.5 - 5.0 g/dL   AST 25 15 - 41 U/L   ALT 22 0 - 44 U/L   Alkaline Phosphatase 68 38 - 126 U/L   Total Bilirubin 1.4 (H) 0.3 - 1.2 mg/dL   GFR, Estimated >60 >60 mL/min   Anion gap 13 5 - 15  CBG monitoring, ED     Status: Abnormal   Collection Time: 06/28/20  2:45 AM  Result Value Ref Range   Glucose-Capillary 132 (H) 70 - 99 mg/dL  I-stat chem 8, ED      Status: Abnormal   Collection Time: 06/28/20  2:54 AM  Result Value Ref Range   Sodium 139 135 - 145 mmol/L   Potassium 4.1 3.5 - 5.1 mmol/L   Chloride 103 98 - 111 mmol/L   BUN 18 8 - 23 mg/dL   Creatinine, Ser 0.70 0.44 - 1.00 mg/dL   Glucose, Bld 148 (H) 70 - 99  mg/dL   Calcium, Ion 1.04 (L) 1.15 - 1.40 mmol/L   TCO2 26 22 - 32 mmol/L   Hemoglobin 16.0 (H) 12.0 - 15.0 g/dL   HCT 47.0 (H) 36.0 - 46.0 %  Resp Panel by RT-PCR (Flu A&B, Covid) Nasopharyngeal Swab     Status: None   Collection Time: 06/28/20  3:49 AM   Specimen: Nasopharyngeal Swab; Nasopharyngeal(NP) swabs in vial transport medium  Result Value Ref Range   SARS Coronavirus 2 by RT PCR NEGATIVE NEGATIVE   Influenza A by PCR NEGATIVE NEGATIVE   Influenza B by PCR NEGATIVE NEGATIVE  CBG monitoring, ED     Status: None   Collection Time: 06/28/20  8:41 AM  Result Value Ref Range   Glucose-Capillary 95 70 - 99 mg/dL    Recent Results (from the past 240 hour(s))  Resp Panel by RT-PCR (Flu A&B, Covid) Nasopharyngeal Swab     Status: None   Collection Time: 06/28/20  3:49 AM   Specimen: Nasopharyngeal Swab; Nasopharyngeal(NP) swabs in vial transport medium  Result Value Ref Range Status   SARS Coronavirus 2 by RT PCR NEGATIVE NEGATIVE Final    Comment: (NOTE) SARS-CoV-2 target nucleic acids are NOT DETECTED.  The SARS-CoV-2 RNA is generally detectable in upper respiratory specimens during the acute phase of infection. The lowest concentration of SARS-CoV-2 viral copies this assay can detect is 138 copies/mL. A negative result does not preclude SARS-Cov-2 infection and should not be used as the sole basis for treatment or other patient management decisions. A negative result may occur with  improper specimen collection/handling, submission of specimen other than nasopharyngeal swab, presence of viral mutation(s) within the areas targeted by this assay, and inadequate number of viral copies(<138 copies/mL). A negative  result must be combined with clinical observations, patient history, and epidemiological information. The expected result is Negative.  Fact Sheet for Patients:  EntrepreneurPulse.com.au  Fact Sheet for Healthcare Providers:  IncredibleEmployment.be  This test is no t yet approved or cleared by the [place] FDA and  has been authorized for detection and/or diagnosis of SARS-CoV-2 by FDA under an Emergency Use Authorization (EUA). This EUA will remain  in effect (meaning this test can be used) for the duration of the COVID-19 declaration under Section 564(b)(1) of the Act, 21 U.S.C.section 360bbb-3(b)(1), unless the authorization is terminated  or revoked sooner.       Influenza A by PCR NEGATIVE NEGATIVE Final   Influenza B by PCR NEGATIVE NEGATIVE Final    Comment: (NOTE) The Xpert Xpress SARS-CoV-2/FLU/RSV plus assay is intended as an aid in the diagnosis of influenza from Nasopharyngeal swab specimens and should not be used as a sole basis for treatment. Nasal washings and aspirates are unacceptable for Xpert Xpress SARS-CoV-2/FLU/RSV testing.  Fact Sheet for Patients: EntrepreneurPulse.com.au  Fact Sheet for Healthcare Providers: IncredibleEmployment.be  This test is not yet approved or cleared by the [place] FDA and has been authorized for detection and/or diagnosis of SARS-CoV-2 by FDA under an Emergency Use Authorization (EUA). This EUA will remain in effect (meaning this test can be used) for the duration of the COVID-19 declaration under Section 564(b)(1) of the Act, 21 U.S.C. section 360bbb-3(b)(1), unless the authorization is terminated or revoked.  Performed at [facility], [place] [address] [place], [place] [address]      Radiology Studies: CT HEAD CODE STROKE WO CONTRAST  Result Date: 06/28/2020 CLINICAL DATA:  Code stroke. Initial evaluation for acute left-sided  weakness. EXAM: CT  HEAD WITHOUT CONTRAST TECHNIQUE: Contiguous axial images were obtained from the base of the skull through the vertex without intravenous contrast. COMPARISON:  Previous study from 01/09/2020. FINDINGS: Brain: Generalized age-related cerebral atrophy with moderate chronic microvascular ischemic disease. Remote posterior right MCA distribution infarct. Additional chronic right cerebellar infarct. No acute intracranial hemorrhage. No acute large vessel territory infarct. No mass lesion, midline shift or mass effect. No hydrocephalus or extra-axial fluid collection. Vascular: No visible hyperdense vessel. Calcified atherosclerosis at the skull base. Skull: Scalp soft tissues and calvarium within normal limits. Sinuses/Orbits: Globes and orbital soft tissues demonstrate no acute finding. Left gaze noted. Prior ocular lens replacement on the left. Paranasal sinuses are clear. No mastoid effusion. Other: None. ASPECTS [facility] Stroke Program Early CT Score) - Ganglionic level infarction (caudate, lentiform nuclei, internal capsule, insula, M1-M3 cortex): 7 - Supraganglionic infarction (M4-M6 cortex): 3 Total score (0-10 with 10 being normal): 10 IMPRESSION: 1. No acute intracranial infarct or other abnormality. 2. ASPECTS is 10. 3. 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 4. Underlying atrophy with chronic small vessel ischemic disease. These results were communicated to Dr. Theda Sers at 3:05 amon 3/6/2022by text page via the [facility] messaging system. Electronically Signed   By: Jeannine Boga M.D.   On: 06/28/2020 03:07   CT ANGIO HEAD CODE STROKE  Result Date: 06/28/2020 CLINICAL DATA:  Initial evaluation for acute stroke. EXAM: CT ANGIOGRAPHY HEAD AND NECK TECHNIQUE: Multidetector CT imaging of the head and neck was performed using the standard protocol during bolus administration of intravenous contrast. Multiplanar CT image reconstructions and MIPs were obtained to evaluate  the vascular anatomy. Carotid stenosis measurements (when applicable) are obtained utilizing NASCET criteria, using the distal internal carotid diameter as the denominator. CONTRAST:  136mL OMNIPAQUE IOHEXOL 350 MG/ML SOLN COMPARISON:  Comparison made with prior CT from earlier same day as well as prior CTA from 05/31/2018. FINDINGS: CTA NECK FINDINGS Aortic arch: Visualized aortic arch of normal caliber with normal 3 vessel branching pattern. Atheromatous change about the arch with no hemodynamically significant stenosis about the origin the great vessels. Right carotid system: Right common and internal carotid arteries patent without stenosis or dissection. Predominantly sub concentric soft plaque about the origin of the right ICA with no more than mild 40% stenosis, stable. Left carotid system: Left common and internal carotid arteries patent without hemodynamically significant stenosis or dissection. Mild calcified plaque about the left carotid bulb without significant stenosis. Vertebral arteries: Both vertebral arteries arise from the subclavian arteries. No hemodynamically significant proximal subclavian artery stenosis. 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 Minimally progressive plaque at the origin of the right vertebral artery without significant stenosis. Vertebral arteries otherwise patent within the neck without stenosis or dissection. Skeleton: No visible acute osseous abnormality. No discrete or worrisome osseous lesions. Other neck: No other acute soft tissue abnormality within the neck. Right submandibular gland either absent or fatty and atrophic in nature. No other mass or adenopathy. Few scattered subcentimeter thyroid nodules measure up to 8 mm, felt to be of doubtful significance given size and patient age. No follow-up imaging recommended (ref: J Am Coll Radiol. 2015 Feb;12(2): 143-50). Upper chest: Visualized upper chest demonstrates no acute  finding. Left-sided pacemaker/AICD. 5 mm left upper lobe nodule noted (series 5, image 165), indeterminate. Review of the MIP images confirms the above findings CTA HEAD FINDINGS Anterior circulation: Petrous segments widely patent. Atheromatous change within the carotid siphons with associated mild-to-moderate stenosis,  relatively stable. 3 mm focal outpouching at the supraclinoid right ICA stable from previous and felt to be most consistent with a small vascular infundibulum. A1 segments patent bilaterally. Normal anterior communicating artery complex. Anterior cerebral arteries mildly irregular but patent to their distal aspects without stenosis. No M1 stenosis or occlusion. Normal MCA bifurcations. No proximal MCA branch occlusion. Distal MCA branches well perfused. Posterior circulation: Both V4 segments patent to the vertebrobasilar junction without flow-limiting stenosis. Both PICA origins patent and normal. Basilar patent to its distal aspect without stenosis. Superior cerebellar arteries patent bilaterally. Right PCA supplied via the basilar. Left PCA supplied via a hypoplastic left P1 segment and robust left posterior communicating artery. Short-segment moderate to severe right P2 stenosis again noted, stable (series 7, image 117). PCAs otherwise well perfused to their distal aspects. Venous sinuses: Grossly patent allowing for timing the contrast bolus. Anatomic variants: None significant. Review of the MIP images confirms the above findings IMPRESSION: 1. No emergent large vessel occlusion. 2. Relatively stable CTA as compared to 2020. 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 3. 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 4. Widely patent carotid arteries within the neck. 5.  Aortic Atherosclerosis (ICD10-I70.0). 6. 5 mm left upper lobe nodule, indeterminate. No follow-up needed if patient is  low-risk. Non-contrast chest CT can be considered in 12 months if patient is high-risk. This recommendation follows the consensus statement: Guidelines for Management of Incidental Pulmonary Nodules Detected on CT Images: From the Fleischner Society 2017; Radiology 2017; 284:228-243. These results were communicated to Dr. Theda Sers at 3:25 amon 3/6/2022by text page via the [facility] messaging system. Electronically Signed   By: Jeannine Boga M.D.   On: 06/28/2020 03:43   CT ANGIO NECK CODE STROKE  Result Date: 06/28/2020 CLINICAL DATA:  Initial evaluation for acute stroke. EXAM: CT ANGIOGRAPHY HEAD AND NECK TECHNIQUE: Multidetector CT imaging of the head and neck was performed using the standard protocol during bolus administration of intravenous contrast. Multiplanar CT image reconstructions and MIPs were obtained to evaluate the vascular anatomy. Carotid stenosis measurements (when applicable) are obtained utilizing NASCET criteria, using the distal internal carotid diameter as the denominator. CONTRAST:  126mL OMNIPAQUE IOHEXOL 350 MG/ML SOLN COMPARISON:  Comparison made with prior CT from earlier same day as well as prior CTA from 05/31/2018. FINDINGS: CTA NECK FINDINGS Aortic arch: Visualized aortic arch of normal caliber with normal 3 vessel branching pattern. Atheromatous change about the arch with no hemodynamically significant stenosis about the origin the great vessels. Right carotid system: Right common and internal carotid arteries patent without stenosis or dissection. Predominantly sub concentric soft plaque about the origin of the right ICA with no more than mild 40% stenosis, stable. Left carotid system: Left common and internal carotid arteries patent without hemodynamically significant stenosis or dissection. Mild calcified plaque about the left carotid bulb without significant stenosis. Vertebral arteries: Both vertebral arteries arise from the subclavian arteries. No hemodynamically  significant proximal subclavian artery stenosis. 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 Minimally progressive plaque at the origin of the right vertebral artery without significant stenosis. Vertebral arteries otherwise patent within the neck without stenosis or dissection. Skeleton: No visible acute osseous abnormality. No discrete or worrisome osseous lesions. Other neck: No other acute soft tissue abnormality within the neck. Right submandibular gland either absent or fatty and atrophic in nature. No other mass or  adenopathy. Few scattered subcentimeter thyroid nodules measure up to 8 mm, felt to be of doubtful significance given size and patient age. No follow-up imaging recommended (ref: J Am Coll Radiol. 2015 Feb;12(2): 143-50). Upper chest: Visualized upper chest demonstrates no acute finding. Left-sided pacemaker/AICD. 5 mm left upper lobe nodule noted (series 5, image 165), indeterminate. Review of the MIP images confirms the above findings CTA HEAD FINDINGS Anterior circulation: Petrous segments widely patent. Atheromatous change within the carotid siphons with associated mild-to-moderate stenosis, relatively stable. 3 mm focal outpouching at the supraclinoid right ICA stable from previous and felt to be most consistent with a small vascular infundibulum. A1 segments patent bilaterally. Normal anterior communicating artery complex. Anterior cerebral arteries mildly irregular but patent to their distal aspects without stenosis. No M1 stenosis or occlusion. Normal MCA bifurcations. No proximal MCA branch occlusion. Distal MCA branches well perfused. Posterior circulation: Both V4 segments patent to the vertebrobasilar junction without flow-limiting stenosis. Both PICA origins patent and normal. Basilar patent to its distal aspect without stenosis. Superior cerebellar arteries patent bilaterally. Right PCA supplied via the basilar. Left PCA supplied via a  hypoplastic left P1 segment and robust left posterior communicating artery. Short-segment moderate to severe right P2 stenosis again noted, stable (series 7, image 117). PCAs otherwise well perfused to their distal aspects. Venous sinuses: Grossly patent allowing for timing the contrast bolus. Anatomic variants: None significant. Review of the MIP images confirms the above findings IMPRESSION: 1. No emergent large vessel occlusion. 2. Relatively stable CTA as compared to 2020. 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 3. 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 4. Widely patent carotid arteries within the neck. 5.  Aortic Atherosclerosis (ICD10-I70.0). 6. 5 mm left upper lobe nodule, indeterminate. No follow-up needed if patient is low-risk. Non-contrast chest CT can be considered in 12 months if patient is high-risk. This recommendation follows the consensus statement: Guidelines for Management of Incidental Pulmonary Nodules Detected on CT Images: From the Fleischner Society 2017; Radiology 2017; 284:228-243. These results were communicated to Dr. Theda Sers at 3:25 amon 3/6/2022by text page via the [facility] messaging system. Electronically Signed   By: Jeannine Boga M.D.   On: 06/28/2020 03:43   CT ANGIO HEAD CODE STROKE  Final Result    CT ANGIO NECK CODE STROKE  Final Result    CT HEAD CODE STROKE WO CONTRAST  Final Result    MR BRAIN WO CONTRAST    (Results Pending)    Scheduled Meds: . atorvastatin  80 mg Oral q1800  . dabigatran  150 mg Oral BID  . insulin aspart  0-6 Units Subcutaneous TID WC  . sodium chloride flush  3 mL Intravenous Once   PRN Meds: ondansetron **OR** ondansetron (ZOFRAN) IV Continuous Infusions:   LOS: 0 days  Time spent: Greater than 50% of the 35 minute visit was spent in counseling/coordination of care for the patient as laid out in the A&P.   [name], MD [facility] 06/28/2020, 12:07 PM

## 2020-06-28 NOTE — Progress Notes (Addendum)
STROKE TEAM PROGRESS NOTE   INTERVAL HISTORY Pt is very sleepy, difficult to keep awake for exam. She offers no complaint.  Vitals:   06/28/20 1515 06/28/20 1530 06/28/20 1545 06/28/20 1600  BP: 126/75 119/68 123/65 130/70  Pulse: 70 69 70 70  Resp: 17 20 (!) 24 (!) 25  Temp:    98.5 F (36.9 C)  TempSrc:    Oral  SpO2: 96% 95% 97% 96%  Weight:      Height:       CBC:  Recent Labs  Lab 06/28/20 0244 06/28/20 0254  WBC 6.1  --   NEUTROABS 3.2  --   HGB 14.6 16.0*  HCT 45.8 47.0*  MCV 92.7  --   PLT 209  --    Basic Metabolic Panel:  Recent Labs  Lab 06/28/20 0244 06/28/20 0254  NA 138 139  K 4.0 4.1  CL 100 103  CO2 25  --   GLUCOSE 155* 148*  BUN 13 18  CREATININE 0.70 0.70  CALCIUM 9.2  --    Lipid Panel: No results for input(s): CHOL, TRIG, HDL, CHOLHDL, VLDL, LDLCALC in the last 168 hours.  HgbA1c: No results for input(s): HGBA1C in the last 168 hours. Urine Drug Screen: No results for input(s): LABOPIA, COCAINSCRNUR, LABBENZ, AMPHETMU, THCU, LABBARB in the last 168 hours.  Alcohol Level No results for input(s): ETH in the last 168 hours.  IMAGING past 24 hours EEG adult  Result Date: 06/28/2020 Mary Havens, MD     06/28/2020 12:14 PM Patient Name: Mary Mercado MRN: [id number] Epilepsy Attending: [name] Referring Physician/Provider: Dr Mary Mercado Date: 06/28/2020 Duration: 25.32 mins Patient history: 80 y.o. female right handed PMHx as above and stroke with residual deficits with normal BP PTA arrives with left upper extremity twitching and left sided weakness. EEG to evaluate for seizure Level of alertness: Awake, asleep AEDs during EEG study: Ativan, LEV Technical aspects: This EEG study was done with scalp electrodes positioned according to the 10-20 International system of electrode placement. Electrical activity was acquired at a sampling rate of 500Hz  and reviewed with a high frequency filter of 70Hz  and a low frequency filter of  1Hz . EEG data were recorded continuously and digitally stored. Description: No clear posterior dominant rhythm was seen. Sleep was characterized by vertex waves, sleep spindles (12 to 14 Hz), maximal frontocentral region.  There is an excessive amount of 15 to 18 Hz beta activity with irregular morphology distributed symmetrically and diffusely. EEG also showed continuous 3 to 6 Hz theta-delta slowing in right temporal region which at times appears rhythmic. Hyperventilation and photic stimulation were not performed.   ABNORMALITY -Continuous slow, right temporal region IMPRESSION: This study is suggestive of cortical dysfunction in right temporal region likely secondary to underlying stroke. No seizures or epileptiform discharges were seen throughout the recording. Mary Mercado   CT HEAD CODE STROKE WO CONTRAST  Result Date: 06/28/2020 CLINICAL DATA:  Code stroke. Initial evaluation for acute left-sided weakness. EXAM: CT HEAD WITHOUT CONTRAST TECHNIQUE: Contiguous axial images were obtained from the base of the skull through the vertex without intravenous contrast. COMPARISON:  Previous study from 01/09/2020. FINDINGS: Brain: Generalized age-related cerebral atrophy with moderate chronic microvascular ischemic disease. Remote posterior right MCA distribution infarct. Additional chronic right cerebellar infarct. No acute intracranial hemorrhage. No acute large vessel territory infarct. No mass lesion, midline shift or mass effect. No hydrocephalus or extra-axial fluid collection. Vascular: No visible hyperdense vessel. Calcified atherosclerosis  at the skull base. Skull: Scalp soft tissues and calvarium within normal limits. Sinuses/Orbits: Globes and orbital soft tissues demonstrate no acute finding. Left gaze noted. Prior ocular lens replacement on the left. Paranasal sinuses are clear. No mastoid effusion. Other: None. ASPECTS [facility] Stroke Program Early CT Score) - Ganglionic level infarction (caudate,  lentiform nuclei, internal capsule, insula, M1-M3 cortex): 7 - Supraganglionic infarction (M4-M6 cortex): 3 Total score (0-10 with 10 being normal): 10 IMPRESSION: 1. No acute intracranial infarct or other abnormality. 2. ASPECTS is 10. 3. 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 4. Underlying atrophy with chronic small vessel ischemic disease. These results were communicated to Dr. Theda Sers at 3:05 amon 3/6/2022by text page via the [facility] messaging system. Electronically Signed   By: Jeannine Boga M.D.   On: 06/28/2020 03:07   CT ANGIO HEAD CODE STROKE  Result Date: 06/28/2020 CLINICAL DATA:  Initial evaluation for acute stroke. EXAM: CT ANGIOGRAPHY HEAD AND NECK TECHNIQUE: Multidetector CT imaging of the head and neck was performed using the standard protocol during bolus administration of intravenous contrast. Multiplanar CT image reconstructions and MIPs were obtained to evaluate the vascular anatomy. Carotid stenosis measurements (when applicable) are obtained utilizing NASCET criteria, using the distal internal carotid diameter as the denominator. CONTRAST:  113mL OMNIPAQUE IOHEXOL 350 MG/ML SOLN COMPARISON:  Comparison made with prior CT from earlier same day as well as prior CTA from 05/31/2018. FINDINGS: CTA NECK FINDINGS Aortic arch: Visualized aortic arch of normal caliber with normal 3 vessel branching pattern. Atheromatous change about the arch with no hemodynamically significant stenosis about the origin the great vessels. Right carotid system: Right common and internal carotid arteries patent without stenosis or dissection. Predominantly sub concentric soft plaque about the origin of the right ICA with no more than mild 40% stenosis, stable. Left carotid system: Left common and internal carotid arteries patent without hemodynamically significant stenosis or dissection. Mild calcified plaque about the left carotid bulb without significant stenosis. Vertebral arteries: Both  vertebral arteries arise from the subclavian arteries. No hemodynamically significant proximal subclavian artery stenosis. 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 Minimally progressive plaque at the origin of the right vertebral artery without significant stenosis. Vertebral arteries otherwise patent within the neck without stenosis or dissection. Skeleton: No visible acute osseous abnormality. No discrete or worrisome osseous lesions. Other neck: No other acute soft tissue abnormality within the neck. Right submandibular gland either absent or fatty and atrophic in nature. No other mass or adenopathy. Few scattered subcentimeter thyroid nodules measure up to 8 mm, felt to be of doubtful significance given size and patient age. No follow-up imaging recommended (ref: J Am Coll Radiol. 2015 Feb;12(2): 143-50). Upper chest: Visualized upper chest demonstrates no acute finding. Left-sided pacemaker/AICD. 5 mm left upper lobe nodule noted (series 5, image 165), indeterminate. Review of the MIP images confirms the above findings CTA HEAD FINDINGS Anterior circulation: Petrous segments widely patent. Atheromatous change within the carotid siphons with associated mild-to-moderate stenosis, relatively stable. 3 mm focal outpouching at the supraclinoid right ICA stable from previous and felt to be most consistent with a small vascular infundibulum. A1 segments patent bilaterally. Normal anterior communicating artery complex. Anterior cerebral arteries mildly irregular but patent to their distal aspects without stenosis. No M1 stenosis or occlusion. Normal MCA bifurcations. No proximal MCA branch occlusion. Distal MCA branches well perfused. Posterior circulation: Both V4 segments patent to the vertebrobasilar junction without flow-limiting stenosis. Both PICA origins  patent and normal. Basilar patent to its distal aspect without stenosis. Superior cerebellar arteries patent  bilaterally. Right PCA supplied via the basilar. Left PCA supplied via a hypoplastic left P1 segment and robust left posterior communicating artery. Short-segment moderate to severe right P2 stenosis again noted, stable (series 7, image 117). PCAs otherwise well perfused to their distal aspects. Venous sinuses: Grossly patent allowing for timing the contrast bolus. Anatomic variants: None significant. Review of the MIP images confirms the above findings IMPRESSION: 1. No emergent large vessel occlusion. 2. Relatively stable CTA as compared to 2020. 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 3. 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 4. Widely patent carotid arteries within the neck. 5.  Aortic Atherosclerosis (ICD10-I70.0). 6. 5 mm left upper lobe nodule, indeterminate. No follow-up needed if patient is low-risk. Non-contrast chest CT can be considered in 12 months if patient is high-risk. This recommendation follows the consensus statement: Guidelines for Management of Incidental Pulmonary Nodules Detected on CT Images: From the Fleischner Society 2017; Radiology 2017; 284:228-243. These results were communicated to Dr. Theda Sers at 3:25 amon 3/6/2022by text page via the [facility] messaging system. Electronically Signed   By: Jeannine Boga M.D.   On: 06/28/2020 03:43   CT ANGIO NECK CODE STROKE  Result Date: 06/28/2020 CLINICAL DATA:  Initial evaluation for acute stroke. EXAM: CT ANGIOGRAPHY HEAD AND NECK TECHNIQUE: Multidetector CT imaging of the head and neck was performed using the standard protocol during bolus administration of intravenous contrast. Multiplanar CT image reconstructions and MIPs were obtained to evaluate the vascular anatomy. Carotid stenosis measurements (when applicable) are obtained utilizing NASCET criteria, using the distal internal carotid diameter as the denominator. CONTRAST:   11mL OMNIPAQUE IOHEXOL 350 MG/ML SOLN COMPARISON:  Comparison made with prior CT from earlier same day as well as prior CTA from 05/31/2018. FINDINGS: CTA NECK FINDINGS Aortic arch: Visualized aortic arch of normal caliber with normal 3 vessel branching pattern. Atheromatous change about the arch with no hemodynamically significant stenosis about the origin the great vessels. Right carotid system: Right common and internal carotid arteries patent without stenosis or dissection. Predominantly sub concentric soft plaque about the origin of the right ICA with no more than mild 40% stenosis, stable. Left carotid system: Left common and internal carotid arteries patent without hemodynamically significant stenosis or dissection. Mild calcified plaque about the left carotid bulb without significant stenosis. Vertebral arteries: Both vertebral arteries arise from the subclavian arteries. No hemodynamically significant proximal subclavian artery stenosis. 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 Minimally progressive plaque at the origin of the right vertebral artery without significant stenosis. Vertebral arteries otherwise patent within the neck without stenosis or dissection. Skeleton: No visible acute osseous abnormality. No discrete or worrisome osseous lesions. Other neck: No other acute soft tissue abnormality within the neck. Right submandibular gland either absent or fatty and atrophic in nature. No other mass or adenopathy. Few scattered subcentimeter thyroid nodules measure up to 8 mm, felt to be of doubtful significance given size and patient age. No follow-up imaging recommended (ref: J Am Coll Radiol. 2015 Feb;12(2): 143-50). Upper chest: Visualized upper chest demonstrates no acute finding. Left-sided pacemaker/AICD. 5 mm left upper lobe nodule noted (series 5, image 165), indeterminate. Review of the MIP images confirms the above findings CTA HEAD FINDINGS  Anterior circulation: Petrous segments widely patent. Atheromatous change within the carotid  siphons with associated mild-to-moderate stenosis, relatively stable. 3 mm focal outpouching at the supraclinoid right ICA stable from previous and felt to be most consistent with a small vascular infundibulum. A1 segments patent bilaterally. Normal anterior communicating artery complex. Anterior cerebral arteries mildly irregular but patent to their distal aspects without stenosis. No M1 stenosis or occlusion. Normal MCA bifurcations. No proximal MCA branch occlusion. Distal MCA branches well perfused. Posterior circulation: Both V4 segments patent to the vertebrobasilar junction without flow-limiting stenosis. Both PICA origins patent and normal. Basilar patent to its distal aspect without stenosis. Superior cerebellar arteries patent bilaterally. Right PCA supplied via the basilar. Left PCA supplied via a hypoplastic left P1 segment and robust left posterior communicating artery. Short-segment moderate to severe right P2 stenosis again noted, stable (series 7, image 117). PCAs otherwise well perfused to their distal aspects. Venous sinuses: Grossly patent allowing for timing the contrast bolus. Anatomic variants: None significant. Review of the MIP images confirms the above findings IMPRESSION: 1. No emergent large vessel occlusion. 2. Relatively stable CTA as compared to 2020. 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 3. 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 4. Widely patent carotid arteries within the neck. 5.  Aortic Atherosclerosis (ICD10-I70.0). 6. 5 mm left upper lobe nodule, indeterminate. No follow-up needed if patient is low-risk. Non-contrast chest CT can be considered in 12 months if patient is high-risk. This recommendation follows the consensus statement: Guidelines for Management of Incidental  Pulmonary Nodules Detected on CT Images: From the Fleischner Society 2017; Radiology 2017; 284:228-243. These results were communicated to Dr. Theda Sers at 3:25 amon 3/6/2022by text page via the [facility] messaging system. Electronically Signed   By: Jeannine Boga M.D.   On: 06/28/2020 03:43    PHYSICAL EXAM Constitutional: Appears well-developed and well-nourished.  Psych: Affect appropriate to situation Eyes: No scleral injection HENT: No OP obstruction. Head: Normocephalic.  Cardiovascular: Normal rate and regular rhythm.  Respiratory: Effort normal, symmetric excursions bilaterally, no audible wheezing. GI: Soft.  No distension. There is no tenderness.  Skin: WDI  Neuro: Mental Status: Briefly awakes, attends, but cannot hold her attention. Gives her name, dob, age, but no other info. Language is sparse, but clear. She did follow commands with eyes closed entire time. Visual Fields left field cut. Pupils are equal, round, and reactive to light. EOMI without ptosis or diploplia. Facial sensation is symmetric to temperature. Facial movement is symmetric. Hearing is intact to voice. Tone is increase on left. Bulk is normal. Diffusely weak throughout with min effort during exam. 4-/5 left and lower extremities. Sensation is symmetric to light touch and temperature in the arms and legs. Deep Tendon Reflexes: ~3+ in left. Babinski (+)L Unable to prefore ftn/hts Gait - Deferred  ASSESSMENT/PLAN [name] is a 80 y.o. female with history of stroke with residual deficits at baseline. On 3/5 she presented with left upper extremity twitching and left sided weakness. Patient stated symptoms are similar to the symptoms she experienced with previous stroke. Twitching largely resolved with lorazepam except for left thumb twitching and the patient was loaded with LEV. She will also need to be evaluated for seizure to determine if the seizure was provoked or not. She currently is  on dagibatran after having a stroke on apixaban.  Focal seizure  Pt stated that she tried to hold onto her walker and started to have LUE jerking movement and she was  not able to control, she denies weakness  During NP encounter, pt again had LUE jerking followed with drowsy sleepy concerning for post ictal  On MD exam later, pt back to baseline  EEG no seizures, cortical slowing on right as expected d/t old stroke.   Will start [place] given her risk and compelling presentation  Stroke to be ruled out  Code Stroke CT: old strokes in [facility] and R cerebellum.   CTA head & neck: stable stenosis as before (bilateral ICA siphon, right P2 and left VA severe stenosis). No LVO.   MRI  pending  2D Echo pending  LDL pending  HgbA1c 6.4  VTE prophylaxis - SCDs  Pradaxa prior to admission, now on Pradaxa 150 twice daily  Therapy recommendations:  pending  Disposition:  pending  History of stroke  05/2018 admitted for left-sided numbness weakness, status post TPA, MRI showed right frontal parietal infarct.  CTA head neck showed right P2, left VA, bilateral ICA siphon severe stenosis.  EF 60 to 65%.  Negative for DVT, TEE negative.  LDL 117, A1c 5.9.  Discharged with DAPT and Lipitor 40  30-day CardioNet monitoring showed A. fib patient started on Eliquis.  12/2019  Admitted for right cerebellar infarct.  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  EF 60 to 65%, LDL 81, A1c 5.9.  Eliquis changed to Pradaxa, continue Lipitor 80.  Hypertension  Home meds:  Cozaar, Lopressor  Stable . Long-term BP goal normotensive  Hyperlipidemia  Home meds: Lipitor 80mg ; resumed in hospital  LDL 81, goal < 70  Continue statin at discharge  Other Stroke Risk Factors  Advanced Age >/= 47   OSA  Other Active Problems  BPPV  Hospital day # 0  Desiree Metzger-Cihelka, ARNP-C, ANVP-BC Pager: [phone]   ATTENDING NOTE: I reviewed  above note and agree with the assessment and plan. Pt was seen and examined.   80 year old female with history of hypertension, hyperlipidemia, A. fib on Pradaxa, OSA, BPPV admitted for slurred speech, difficulty speaking and left upper extremity twitching/jerking.  CT no acute abnormality but right MCA and right cerebellum old infarcts.  CTA head and neck stable intracranial stenosis.  MRI pending, 2D echo pending, A1c 6.4, LDL pending.  Creatinine 0.70.  Patient stated that she was at home with her husband, she was trying to hold onto her walker but had a sudden onset left upper extremity shaking jerking not able to control.  She cannot tell how long it lasted.  However, according to neuro NP, patient again had left upper extremity shaking jerking followed by drowsy sleepy, concerning for postictal.  EEG no seizure but right cortical slowing.  Patient symptoms concerning for focal seizure with right brain origin, started on Keppra.  On my examination later, patient was neurologically intact no focal deficit.  Patient also had history of right MCA and right cerebellar infarct in 05/2018 and 9/27 1 respectively.  CT head and neck both time showed intracranial stenosis with right P2, left VA, bilateral ICA siphon stenosis.  30-day Cardiac monitoring found to have A. fib, initially on Eliquis, then changed to Pradaxa at second stroke.  Currently MRI pending to rule out stroke. Continue Pradaxa and Lipitor 80 at this time.  For detailed assessment and plan, please refer to above as I have made changes wherever appropriate.   Rosalin Hawking, MD PhD Stroke Neurology 06/28/2020 11:04 PM     To contact Stroke Continuity provider, please  refer to [email or web address] After hours, contact General Neurology

## 2020-06-28 NOTE — Assessment & Plan Note (Signed)
-  Weakness seems to be at baseline on exam. -Follow-up PT/OT -So far, home health recommended with 24/7 supervision and she states her husband does take care of her at home

## 2020-06-28 NOTE — Progress Notes (Signed)
06/28/20 1048  PT Visit Information  Last PT Received On 06/28/20  Assistance Needed +1  PT/OT/SLP Co-Evaluation/Treatment Yes  Reason for Co-Treatment Necessary to address cognition/behavior during functional activity;For patient/therapist safety;To address functional/ADL transfers  PT goals addressed during session Mobility/safety with mobility;Balance;Proper use of DME  History of Present Illness This 80 y.o. female presented to ED on 3/6 with Lt UE twitching associated with Rt sided HA.  CT of head showed no acute infarct.  MRI pending.  PMH includes:  h/o Rt parietal CVA, and Rt cerebellar CVA, HTN, A-Fib, BPPV, s/p pacemaker implant.  Precautions  Precautions Fall  Precaution Comments Lt inattention  Restrictions  Weight Bearing Restrictions No  Home Living  Family/patient expects to be discharged to: Private residence  Living Arrangements Spouse/significant other  Available Help at Discharge Family;Available 24 hours/day  Type of Home House  Home Access Level entry  Home Layout One level  Bathroom Shower/Tub Walk-in shower  [place] - single point;Shower seat;Grab bars - tub/shower;Walker - 4 wheels  Prior Function  Level of Independence Needs assistance  Gait / Transfers Assistance Needed Uses rollator for mobility tasks.  ADL's / [place] with ADLs, but requires assist for IADLs.  Communication  Communication No difficulties  Pain Assessment  Pain Assessment No/denies pain  Cognition  Arousal/Alertness Lethargic;Awake/alert  Behavior During Therapy Flat affect  Overall Cognitive Status Impaired/Different from baseline  Area of Impairment Orientation;Attention;Following commands;Safety/judgement;Awareness;Problem solving  Orientation Level Disoriented to;Place  Current Attention Level Sustained  Following Commands Follows one step commands consistently;Follows one step commands with increased  time;Follows multi-step commands inconsistently;Follows multi-step commands with increased time  Safety/Judgement Decreased awareness of deficits;Decreased awareness of safety  Awareness Emergent  Problem Solving Slow processing;Decreased initiation;Difficulty sequencing;Requires verbal cues  General Comments Pt very lethargic intially.   She  thought she was at home with no awareness that she was in ED until cued.  She demonstrates slow processing.  Lt inattention noted  Upper Extremity Assessment  Upper Extremity Assessment Defer to OT evaluation  Lower Extremity Assessment  Lower Extremity Assessment LLE deficits/detail;Generalized weakness  LLE Deficits / Details Per notes, L sided weakness at baseline. Also noted L inattention  Cervical / Trunk Assessment  Cervical / Trunk Assessment Kyphotic  Bed Mobility  Overal bed mobility Needs Assistance  Bed Mobility Supine to Sit;Sit to Supine  Supine to sit Min assist  Sit to supine Min assist  General bed mobility comments assist to lift trunk from ED stretcher and to assist feet back onto stretcher  Transfers  Overall transfer level Needs assistance  Equipment used Rolling walker (2 wheeled)  Transfers Sit to/from Stand  Sit to Stand Min assist;+2 safety/equipment  General transfer comment assist for balance and safety, and assist to maintain Lt UE on RW  Ambulation/Gait  Ambulation/Gait assistance Min assist;+2 safety/equipment  Gait Distance (Feet) 5 Feet  Assistive device Rolling walker (2 wheeled)  Gait Pattern/deviations Step-through pattern;Decreased stride length;Drifts right/left  General Gait Details Pt ambulating short distance in room. Pt with decreased awareness of how to use RW appropriately and also tended to drift to the L, even with short distance. Multimodal cues required throughout.  Gait velocity Decreased  Balance  Overall balance assessment Needs assistance  Sitting-balance support Feet unsupported;Single  extremity supported  Sitting balance-Leahy Scale Fair  Standing balance support Bilateral upper extremity supported  Standing balance-Leahy Scale Poor  Standing balance comment requires bil. UE support  General Comments  General comments (skin integrity, edema, etc.) No family present  PT - End of Session  Equipment Utilized During Treatment Gait belt  Activity Tolerance Patient tolerated treatment well  Patient left in bed;with call bell/phone within reach (on stretcher in ED)  Nurse Communication Mobility status  PT Assessment  PT Recommendation/Assessment Patient needs continued PT services  PT Visit Diagnosis Muscle weakness (generalized) (M62.81);Other symptoms and signs involving the nervous system (R29.898);Difficulty in walking, not elsewhere classified (R26.2)  PT Problem List Decreased strength;Decreased balance;Decreased activity tolerance;Decreased mobility;Decreased cognition;Decreased knowledge of use of DME;Decreased knowledge of precautions;Decreased safety awareness  PT Plan  PT Frequency (ACUTE ONLY) Min 3X/week  PT Treatment/Interventions (ACUTE ONLY) DME instruction;Gait training;Functional mobility training;Therapeutic exercise;Therapeutic activities;Balance training;Cognitive remediation;Patient/family education;Neuromuscular re-education  AM-PAC PT "6 Clicks" Mobility Outcome Measure (Version 2)  Help needed turning from your back to your side while in a flat bed without using bedrails? 3  Help needed moving from lying on your back to sitting on the side of a flat bed without using bedrails? 3  Help needed moving to and from a bed to a chair (including a wheelchair)? 3  Help needed standing up from a chair using your arms (e.g., wheelchair or bedside chair)? 3  Help needed to walk in hospital room? 3  Help needed climbing 3-5 steps with a railing?  2  6 Click Score 17  Consider Recommendation of Discharge To: Home with [facility]  PT Recommendation  Follow Up  Recommendations Outpatient PT;Supervision/Assistance - 24 hour  PT equipment None recommended by PT  Individuals Consulted  Consulted and Agree with Results and Recommendations Patient  Acute Rehab PT Goals  Patient Stated Goal to find out why she is in hospital  PT Goal Formulation With patient  Time For Goal Achievement 07/12/20  Potential to Achieve Goals Good  PT Time Calculation  PT Start Time (ACUTE ONLY) 0930  PT Stop Time (ACUTE ONLY) 0950  PT Time Calculation (min) (ACUTE ONLY) 20 min  PT General Charges  $$ ACUTE PT VISIT 1 Visit  PT Evaluation  $PT Eval Moderate Complexity 1 Mod  Written Expression  Dominant Hand Right    Pt admitted secondary to problem above with deficits above. Slowed processing and difficulty sequencing noted. Pt also with L inattention and unsure of how far off she is from baseline. Requiring min A for mobility using RW this session. Multimodal cues required for sequencing. Will require 24/7 supervision at d/c and would benefit from outpatient PT follow up. Will continue to follow acutely.   Reuel Derby, PT, DPT  Acute Rehabilitation Services  Pager: [phone] Office: [phone]

## 2020-06-28 NOTE — ED Notes (Signed)
Attempted report x 2. RN to call back.  

## 2020-06-28 NOTE — Assessment & Plan Note (Signed)
-  Last A1c 5.9% on 01/10/2020 -Continue SSI and CBG monitoring while hospitalized

## 2020-06-28 NOTE — Assessment & Plan Note (Addendum)
-   concern for underlying old CVA contributing - s/p ativan and Keppra in ER - follow up EEG results: "uggestive of cortical dysfunction in right temporal region likely secondary to underlying stroke. No seizures or epileptiform discharges were seen throughout the recording." - follow up MRI brain: no new stroke; encephalomalacia noted  - SLP eval done; regular diet - neuro recommends to continue keppra at discharge

## 2020-06-28 NOTE — ED Provider Notes (Signed)
[facility] EMERGENCY DEPARTMENT Provider Note   CSN: [id number] Arrival date & time: 06/28/20  7253  An emergency department physician performed an initial assessment on this suspected stroke patient at 0248.  History Chief Complaint  Patient presents with  . Code Stroke ( Left Arm Twitchings/Stuttering) LSN 2300    Mary Mercado is a 80 y.o. female.  Patient with history of stroke with left-sided hemiplegia presents to the emergency department for evaluation of sudden onset difficulty with speech (stuttering) and left upper extremity twitching.  Last seen normal at around 11 PM.  During transport, EMS report that she started to complain of some right-sided headache.        Past Medical History:  Diagnosis Date  . Allergy   . CVA (cerebral vascular accident) ([place]) 05/2018, 01/09/20  . Hyperlipidemia LDL goal <70   . Hypertension   . Palpitations   . Vertigo, benign positional     Patient Active Problem List   Diagnosis Date Noted  . New onset seizure ([place]) 06/28/2020  . 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  . Hemiplegia and hemiparesis following cerebral infarction affecting left non-dominant side ([place]) 02/05/2020  . 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 CVA (cerebral vascular accident) ([place]) 01/09/2020  . Dizziness 11/30/2018  . Atrial fibrillation ([place]) 08/13/2018  . Prediabetes 06/15/2018  . Palpitations 06/05/2018  . Essential hypertension 06/05/2018  . Hyperlipidemia LDL goal <70 06/05/2018  . Acute ischemic right MCA stroke ([place]) 06/05/2018  . Stroke (cerebrum) ([place]) - R MCA s/p tPA, embolic, source unknown 66/44/0347  . SVT (supraventricular tachycardia) ([place]) 05/28/2015  . PVC's (premature ventricular contractions) 05/28/2015  . BENIGN POSITIONAL VERTIGO 06/16/2009  . ALLERGIC RHINITIS 06/16/2009    Past Surgical History:  Procedure Laterality Date  . AV NODE ABLATION N/A 03/30/2020    Procedure: AV NODE ABLATION;  Surgeon: Deboraha Sprang, MD;  Location: [facility];  Service: Cardiovascular;  Laterality: N/A;  . AV NODE ABLATION N/A 03/30/2020   Procedure: AV NODE ABLATION;  Surgeon: Deboraha Sprang, MD;  Location: [facility];  Service: Cardiovascular;  Laterality: N/A;  . CARDIOVERSION Left 10/31/2018   Procedure: CARDIOVERSION;  Surgeon: Thayer Headings, MD;  Location: [facility];  Service: Cardiovascular;  Laterality: Left;  . CARDIOVERSION N/A 01/08/2020   Procedure: CARDIOVERSION;  Surgeon: [name], MD;  Location: [facility] ENDOSCOPY;  Service: Cardiovascular;  Laterality: N/A;  . Hemilaminectomy and microdiskectomy at L4-5 on the left.  12/10/2003  . PACEMAKER IMPLANT N/A 03/30/2020   Procedure: PACEMAKER IMPLANT;  Surgeon: Deboraha Sprang, MD;  Location: [facility];  Service: Cardiovascular;  Laterality: N/A;  . TEE WITHOUT CARDIOVERSION N/A 06/05/2018   Procedure: TRANSESOPHAGEAL ECHOCARDIOGRAM (TEE);  Surgeon: Lelon Perla, MD;  Location: [facility] ENDOSCOPY;  Service: Cardiovascular;  Laterality: N/A;  loop     OB History   No obstetric history on file.     Family History  Problem Relation Age of Onset  . Colon cancer Father   . Diabetes Mother   . Heart attack Mother        57  . Heart attack Brother 53    Social History   Tobacco Use  . Smoking status: Never Smoker  . Smokeless tobacco: Never Used  Vaping Use  . Vaping Use: Never used  Substance Use Topics  . Alcohol use: Never  . Drug use: Never    Home  Medications Prior to Admission medications   Medication Sig Start Date End Date Taking? Authorizing Provider  atorvastatin (LIPITOR) 80 MG tablet TAKE 1 TABLET (80 MG TOTAL) BY MOUTH DAILY AT 6 PM. 04/03/20  Yes [name], MD  losartan (COZAAR) 50 MG tablet Take 1 tablet (50 mg total) by mouth daily. 06/10/20 09/08/20 Yes Burtis Junes, NP  metoprolol tartrate (LOPRESSOR) 25 MG tablet Take 0.5 tablets (12.5 mg  total) by mouth at bedtime. 05/26/20  Yes Nahser, Wonda Cheng, MD  PRADAXA 150 MG CAPS capsule TAKE 1 CAPSULE (150 MG TOTAL) BY MOUTH EVERY 12 (TWELVE) HOURS. Patient taking differently: Take 150 mg by mouth 2 (two) times daily. 04/03/20  Yes [name], MD  diltiazem (CARDIZEM) 30 MG tablet Take 1 tablet (30 mg total) by mouth 4 (four) times daily as needed. Prn Patient not taking: No sig reported 02/07/20   Nahser, Wonda Cheng, MD    Allergies    Duloxetine and Acetaminophen  Review of Systems   Review of Systems  Neurological: Positive for speech difficulty and headaches.  All other systems reviewed and are negative.   Physical Exam Updated Vital Signs BP (!) 107/58   Pulse 70   Temp 98.6 F (37 C) (Oral)   Resp (!) 25   Ht 5\' 6"  (1.676 m)   Wt 58.3 kg   SpO2 98%   BMI 20.74 kg/m   Physical Exam Vitals and nursing note reviewed.  Constitutional:      General: She is not in acute distress.    Appearance: Normal appearance. She is well-developed and well-nourished.  HENT:     Head: Normocephalic and atraumatic.     Right Ear: Hearing normal.     Left Ear: Hearing normal.     Nose: Nose normal.     Mouth/Throat:     Mouth: Oropharynx is clear and moist and mucous membranes are normal.  Eyes:     Extraocular Movements: EOM normal.     Conjunctiva/sclera: Conjunctivae normal.     Pupils: Pupils are equal, round, and reactive to light.  Cardiovascular:     Rate and Rhythm: Regular rhythm.     Heart sounds: S1 normal and S2 normal. No murmur heard. No friction rub. No gallop.   Pulmonary:     Effort: Pulmonary effort is normal. No respiratory distress.     Breath sounds: Normal breath sounds.  Chest:     Chest wall: No tenderness.  Abdominal:     General: Bowel sounds are normal.     Palpations: Abdomen is soft. There is no hepatosplenomegaly.     Tenderness: There is no abdominal tenderness. There is no guarding or rebound. Negative signs include Murphy's sign and  McBurney's sign.     Hernia: No hernia is present.  Musculoskeletal:        General: Normal range of motion.     Cervical back: Normal range of motion and neck supple.  Skin:    General: Skin is warm, dry and intact.     Findings: No rash.     Nails: There is no cyanosis.  Neurological:     Mental Status: She is alert and oriented to person, place, and time.     GCS: GCS eye subscore is 4. GCS verbal subscore is 5. GCS motor subscore is 6.     Cranial Nerves: No cranial nerve deficit.     Sensory: No sensory deficit.     Coordination: Coordination normal.  Deep Tendon Reflexes: Strength normal.     Comments: Rhythmic movement and fasciculations of the muscles of the left upper extremity  Psychiatric:        Mood and Affect: Mood and affect normal.        Speech: Speech normal.        Behavior: Behavior normal.        Thought Content: Thought content normal.     ED Results / Procedures / Treatments   Labs (all labs ordered are listed, but only abnormal results are displayed) Labs Reviewed  PROTIME-INR - Abnormal; Notable for the following components:      Result Value   Prothrombin Time 18.8 (*)    INR 1.6 (*)    All other components within normal limits  APTT - Abnormal; Notable for the following components:   aPTT 51 (*)    All other components within normal limits  COMPREHENSIVE METABOLIC PANEL - Abnormal; Notable for the following components:   Glucose, Bld 155 (*)    Total Bilirubin 1.4 (*)    All other components within normal limits  I-STAT CHEM 8, ED - Abnormal; Notable for the following components:   Glucose, Bld 148 (*)    Calcium, Ion 1.04 (*)    Hemoglobin 16.0 (*)    HCT 47.0 (*)    All other components within normal limits  CBG MONITORING, ED - Abnormal; Notable for the following components:   Glucose-Capillary 132 (*)    All other components within normal limits  RESP PANEL BY RT-PCR (FLU A&B, COVID) ARPGX2  CBC  DIFFERENTIAL     EKG None  Radiology CT HEAD CODE STROKE WO CONTRAST  Result Date: 06/28/2020 CLINICAL DATA:  Code stroke. Initial evaluation for acute left-sided weakness. EXAM: CT HEAD WITHOUT CONTRAST TECHNIQUE: Contiguous axial images were obtained from the base of the skull through the vertex without intravenous contrast. COMPARISON:  Previous study from 01/09/2020. FINDINGS: Brain: Generalized age-related cerebral atrophy with moderate chronic microvascular ischemic disease. Remote posterior right MCA distribution infarct. Additional chronic right cerebellar infarct. No acute intracranial hemorrhage. No acute large vessel territory infarct. No mass lesion, midline shift or mass effect. No hydrocephalus or extra-axial fluid collection. Vascular: No visible hyperdense vessel. Calcified atherosclerosis at the skull base. Skull: Scalp soft tissues and calvarium within normal limits. Sinuses/Orbits: Globes and orbital soft tissues demonstrate no acute finding. Left gaze noted. Prior ocular lens replacement on the left. Paranasal sinuses are clear. No mastoid effusion. Other: None. ASPECTS [facility] Stroke Program Early CT Score) - Ganglionic level infarction (caudate, lentiform nuclei, internal capsule, insula, M1-M3 cortex): 7 - Supraganglionic infarction (M4-M6 cortex): 3 Total score (0-10 with 10 being normal): 10 IMPRESSION: 1. No acute intracranial infarct or other abnormality. 2. ASPECTS is 10. 3. 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 4. Underlying atrophy with chronic small vessel ischemic disease. These results were communicated to Dr. Theda Sers at 3:05 amon 3/6/2022by text page via the [facility] messaging system. Electronically Signed   By: Jeannine Boga M.D.   On: 06/28/2020 03:07   CT ANGIO HEAD CODE STROKE  Result Date: 06/28/2020 CLINICAL DATA:  Initial evaluation for acute stroke. EXAM: CT ANGIOGRAPHY HEAD AND NECK TECHNIQUE: Multidetector CT imaging of the head and neck was  performed using the standard protocol during bolus administration of intravenous contrast. Multiplanar CT image reconstructions and MIPs were obtained to evaluate the vascular anatomy. Carotid stenosis measurements (when applicable) are obtained utilizing NASCET criteria, using the distal internal carotid diameter  as the denominator. CONTRAST:  128mL OMNIPAQUE IOHEXOL 350 MG/ML SOLN COMPARISON:  Comparison made with prior CT from earlier same day as well as prior CTA from 05/31/2018. FINDINGS: CTA NECK FINDINGS Aortic arch: Visualized aortic arch of normal caliber with normal 3 vessel branching pattern. Atheromatous change about the arch with no hemodynamically significant stenosis about the origin the great vessels. Right carotid system: Right common and internal carotid arteries patent without stenosis or dissection. Predominantly sub concentric soft plaque about the origin of the right ICA with no more than mild 40% stenosis, stable. Left carotid system: Left common and internal carotid arteries patent without hemodynamically significant stenosis or dissection. Mild calcified plaque about the left carotid bulb without significant stenosis. Vertebral arteries: Both vertebral arteries arise from the subclavian arteries. No hemodynamically significant proximal subclavian artery stenosis. 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 Minimally progressive plaque at the origin of the right vertebral artery without significant stenosis. Vertebral arteries otherwise patent within the neck without stenosis or dissection. Skeleton: No visible acute osseous abnormality. No discrete or worrisome osseous lesions. Other neck: No other acute soft tissue abnormality within the neck. Right submandibular gland either absent or fatty and atrophic in nature. No other mass or adenopathy. Few scattered subcentimeter thyroid nodules measure up to 8 mm, felt to be of doubtful significance given  size and patient age. No follow-up imaging recommended (ref: J Am Coll Radiol. 2015 Feb;12(2): 143-50). Upper chest: Visualized upper chest demonstrates no acute finding. Left-sided pacemaker/AICD. 5 mm left upper lobe nodule noted (series 5, image 165), indeterminate. Review of the MIP images confirms the above findings CTA HEAD FINDINGS Anterior circulation: Petrous segments widely patent. Atheromatous change within the carotid siphons with associated mild-to-moderate stenosis, relatively stable. 3 mm focal outpouching at the supraclinoid right ICA stable from previous and felt to be most consistent with a small vascular infundibulum. A1 segments patent bilaterally. Normal anterior communicating artery complex. Anterior cerebral arteries mildly irregular but patent to their distal aspects without stenosis. No M1 stenosis or occlusion. Normal MCA bifurcations. No proximal MCA branch occlusion. Distal MCA branches well perfused. Posterior circulation: Both V4 segments patent to the vertebrobasilar junction without flow-limiting stenosis. Both PICA origins patent and normal. Basilar patent to its distal aspect without stenosis. Superior cerebellar arteries patent bilaterally. Right PCA supplied via the basilar. Left PCA supplied via a hypoplastic left P1 segment and robust left posterior communicating artery. Short-segment moderate to severe right P2 stenosis again noted, stable (series 7, image 117). PCAs otherwise well perfused to their distal aspects. Venous sinuses: Grossly patent allowing for timing the contrast bolus. Anatomic variants: None significant. Review of the MIP images confirms the above findings IMPRESSION: 1. No emergent large vessel occlusion. 2. Relatively stable CTA as compared to 2020. 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 3. 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 4. Widely patent  carotid arteries within the neck. 5.  Aortic Atherosclerosis (ICD10-I70.0). 6. 5 mm left upper lobe nodule, indeterminate. No follow-up needed if patient is low-risk. Non-contrast chest CT can be considered in 12 months if patient is high-risk. This recommendation follows the consensus statement: Guidelines for Management of Incidental Pulmonary Nodules Detected on CT Images: From the Fleischner Society 2017; Radiology 2017; 284:228-243. These results were communicated to Dr. Theda Sers at 3:25 amon 3/6/2022by text page via the [facility] messaging system. Electronically Signed  By: Jeannine Boga M.D.   On: 06/28/2020 03:43   CT ANGIO NECK CODE STROKE  Result Date: 06/28/2020 CLINICAL DATA:  Initial evaluation for acute stroke. EXAM: CT ANGIOGRAPHY HEAD AND NECK TECHNIQUE: Multidetector CT imaging of the head and neck was performed using the standard protocol during bolus administration of intravenous contrast. Multiplanar CT image reconstructions and MIPs were obtained to evaluate the vascular anatomy. Carotid stenosis measurements (when applicable) are obtained utilizing NASCET criteria, using the distal internal carotid diameter as the denominator. CONTRAST:  130mL OMNIPAQUE IOHEXOL 350 MG/ML SOLN COMPARISON:  Comparison made with prior CT from earlier same day as well as prior CTA from 05/31/2018. FINDINGS: CTA NECK FINDINGS Aortic arch: Visualized aortic arch of normal caliber with normal 3 vessel branching pattern. Atheromatous change about the arch with no hemodynamically significant stenosis about the origin the great vessels. Right carotid system: Right common and internal carotid arteries patent without stenosis or dissection. Predominantly sub concentric soft plaque about the origin of the right ICA with no more than mild 40% stenosis, stable. Left carotid system: Left common and internal carotid arteries patent without hemodynamically significant stenosis or dissection. Mild calcified plaque about the  left carotid bulb without significant stenosis. Vertebral arteries: Both vertebral arteries arise from the subclavian arteries. No hemodynamically significant proximal subclavian artery stenosis. 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 Minimally progressive plaque at the origin of the right vertebral artery without significant stenosis. Vertebral arteries otherwise patent within the neck without stenosis or dissection. Skeleton: No visible acute osseous abnormality. No discrete or worrisome osseous lesions. Other neck: No other acute soft tissue abnormality within the neck. Right submandibular gland either absent or fatty and atrophic in nature. No other mass or adenopathy. Few scattered subcentimeter thyroid nodules measure up to 8 mm, felt to be of doubtful significance given size and patient age. No follow-up imaging recommended (ref: J Am Coll Radiol. 2015 Feb;12(2): 143-50). Upper chest: Visualized upper chest demonstrates no acute finding. Left-sided pacemaker/AICD. 5 mm left upper lobe nodule noted (series 5, image 165), indeterminate. Review of the MIP images confirms the above findings CTA HEAD FINDINGS Anterior circulation: Petrous segments widely patent. Atheromatous change within the carotid siphons with associated mild-to-moderate stenosis, relatively stable. 3 mm focal outpouching at the supraclinoid right ICA stable from previous and felt to be most consistent with a small vascular infundibulum. A1 segments patent bilaterally. Normal anterior communicating artery complex. Anterior cerebral arteries mildly irregular but patent to their distal aspects without stenosis. No M1 stenosis or occlusion. Normal MCA bifurcations. No proximal MCA branch occlusion. Distal MCA branches well perfused. Posterior circulation: Both V4 segments patent to the vertebrobasilar junction without flow-limiting stenosis. Both PICA origins patent and normal. Basilar patent to its  distal aspect without stenosis. Superior cerebellar arteries patent bilaterally. Right PCA supplied via the basilar. Left PCA supplied via a hypoplastic left P1 segment and robust left posterior communicating artery. Short-segment moderate to severe right P2 stenosis again noted, stable (series 7, image 117). PCAs otherwise well perfused to their distal aspects. Venous sinuses: Grossly patent allowing for timing the contrast bolus. Anatomic variants: None significant. Review of the MIP images confirms the above findings IMPRESSION: 1. No emergent large vessel occlusion. 2. Relatively stable CTA as compared to 2020. 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 3. 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 4. Widely patent  carotid arteries within the neck. 5.  Aortic Atherosclerosis (ICD10-I70.0). 6. 5 mm left upper lobe nodule, indeterminate. No follow-up needed if patient is low-risk. Non-contrast chest CT can be considered in 12 months if patient is high-risk. This recommendation follows the consensus statement: Guidelines for Management of Incidental Pulmonary Nodules Detected on CT Images: From the Fleischner Society 2017; Radiology 2017; 284:228-243. These results were communicated to Dr. Theda Sers at 3:25 amon 3/6/2022by text page via the [facility] messaging system. Electronically Signed   By: Jeannine Boga M.D.   On: 06/28/2020 03:43    Procedures Procedures   Medications Ordered in ED Medications  sodium chloride flush (NS) 0.9 % injection 3 mL (3 mLs Intravenous Not Given 06/28/20 0328)  LORazepam (ATIVAN) injection 1 mg ( Intravenous Given 06/28/20 0300)  iohexol (OMNIPAQUE) 350 MG/ML injection 100 mL (100 mLs Intravenous Contrast Given 06/28/20 0309)  levETIRAcetam (KEPPRA) 3,000 mg in sodium chloride 0.9 % 250 mL IVPB (0 mg Intravenous Stopped 06/28/20 0349)    ED Course  I have reviewed the  triage vital signs and the nursing notes.  Pertinent labs & imaging results that were available during my care of the patient were reviewed by me and considered in my medical decision making (see chart for details).    MDM Rules/Calculators/A&P                          Patient received from home via EMS as a code stroke.  Husband reports that she had sudden onset of stuttering speech and twitching of her left arm sometime in the last hour or so.  Upon arrival to the emergency department she is awake, alert and oriented.  She follows commands without difficulty and is able to converse.  She is noted to have rhythmic fasciculations and movements of the left upper extremity.  Screening CT and CTA does not show any acute process.  Patient administered IV Ativan with resolution of the movements.  Presentation seems most consistent with focal seizure.  Patient will be admitted by hospitalist service for further work-up.  IV Keppra initiated by Dr. Theda Sers, neurology.  CRITICAL CARE Performed by: Orpah Greek   Total critical care time: 30 minutes  Critical care time was exclusive of separately billable procedures and treating other patients.  Critical care was necessary to treat or prevent imminent or life-threatening deterioration.  Critical care was time spent personally by me on the following activities: development of treatment plan with patient and/or surrogate as well as nursing, discussions with consultants, evaluation of patient's response to treatment, examination of patient, obtaining history from patient or surrogate, ordering and performing treatments and interventions, ordering and review of laboratory studies, ordering and review of radiographic studies, pulse oximetry and re-evaluation of patient's condition.  Final Clinical Impression(s) / ED Diagnoses Final diagnoses:  Seizure [facility])    Rx / DC Orders ED Discharge Orders    None       Zari Cly, Gwenyth Allegra,  MD 06/28/20 [phone]

## 2020-06-28 NOTE — Assessment & Plan Note (Addendum)
-   permissive HTN initially - BP remains low/normal without intervention

## 2020-06-28 NOTE — ED Triage Notes (Addendum)
Patient arrived with EMS from home LSN 11pm this evening , family reported left arm twitchings/stuttering , evaluated by neurologist and EDP at arrival, then transported to CT scan after clearance. Patient has a pacemaker , currently taking Pradaxa.

## 2020-06-28 NOTE — Assessment & Plan Note (Signed)
-  Continue Lipitor °

## 2020-06-28 NOTE — ED Notes (Addendum)
Transported back to the room .

## 2020-06-28 NOTE — Assessment & Plan Note (Signed)
-  Continue Pradaxa

## 2020-06-28 NOTE — ED Notes (Signed)
Attempted report x 1. RN to call back. 

## 2020-06-28 NOTE — H&P (Signed)
History and Physical    Cheryl Chay DDU:[id number] DOB: 09-28-1940 DOA: 06/28/2020  PCP: [name], MD   Patient coming from: Home.   I have personally briefly reviewed patient's old medical records in [place]  Chief Complaint: AMS.  HPI: Mary Mercado is a 80 y.o. female with medical history significant of seasonal allergies, history of embolic CVA with residual left-sided hemiparesis, hypertension, paroxysmal atrial fibrillation, history of SVT, glucose intolerance, benign positional vertigo who is brought to the emergency department via EMS due to left arm twitching and associated with right-sided headache that begun when the patient woke up to go to the bathroom and was unable to use her walker normally.  Symptoms were similar to her previous event.  History is taken from the patient's husband since the patient is currently sedated following lorazepam and levetiracetam administration.  ED Course: Initial vital signs were temperature 98.6 F, pulse 78, respirations 20, BP 132/86 mmHg O2 sat 99% on room air.  Patient received 1 mg of lorazepam IVP and 3000 mg of IV levetiracetam.  Labwork: Her CBC was normal.  PT was 18.8, INR 1.6 and PTT 51.  CMP shows a glucose of 155 and total bilirubin of 1.4 mg/dL, all other CMP values were normal.  SARS coronavirus 2 by PCR was negative.  Imaging: CT head code stroke did not show any acute intracranial infarct.  There were chronic right posterior MCA and right cerebellar infarcts.  There is underlying atrophy with chronic small vessel ischemic disease.  CT angio head and neck did not show any emergent large vessel occlusion.  It is a relatively stable CTA when compared to the one done in 2020.  There is intracranial atherosclerotic disease including a moderate to severe right P2 and mild to moderate bilateral ICA stenosis which are stable.  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  The study  also saw aortic atherosclerosis.  Please see images and full radiology report for further detail.  Review of Systems: As per HPI otherwise all other systems reviewed and are negative.  Past Medical History:  Diagnosis Date  . Allergy   . CVA (cerebral vascular accident) ([place]) 05/2018, 01/09/20  . Hyperlipidemia LDL goal <70   . Hypertension   . Palpitations   . Vertigo, benign positional    Past Surgical History:  Procedure Laterality Date  . AV NODE ABLATION N/A 03/30/2020   Procedure: AV NODE ABLATION;  Surgeon: Deboraha Sprang, MD;  Location: [facility];  Service: Cardiovascular;  Laterality: N/A;  . AV NODE ABLATION N/A 03/30/2020   Procedure: AV NODE ABLATION;  Surgeon: Deboraha Sprang, MD;  Location: [facility];  Service: Cardiovascular;  Laterality: N/A;  . CARDIOVERSION Left 10/31/2018   Procedure: CARDIOVERSION;  Surgeon: Thayer Headings, MD;  Location: [facility];  Service: Cardiovascular;  Laterality: Left;  . CARDIOVERSION N/A 01/08/2020   Procedure: CARDIOVERSION;  Surgeon: [name], MD;  Location: [facility];  Service: Cardiovascular;  Laterality: N/A;  . Hemilaminectomy and microdiskectomy at L4-5 on the left.  12/10/2003  . PACEMAKER IMPLANT N/A 03/30/2020   Procedure: PACEMAKER IMPLANT;  Surgeon: Deboraha Sprang, MD;  Location: [facility];  Service: Cardiovascular;  Laterality: N/A;  . TEE WITHOUT CARDIOVERSION N/A 06/05/2018   Procedure: TRANSESOPHAGEAL ECHOCARDIOGRAM (TEE);  Surgeon: Lelon Perla, MD;  Location: [facility] ENDOSCOPY;  Service: Cardiovascular;  Laterality: N/A;  loop   Social History  reports that she has never smoked. She has never used smokeless tobacco. She reports that she does not drink alcohol and does not use drugs.  Allergies  Allergen Reactions  . Duloxetine     Causes Bp elevation.  Increased body jerks, nausea  . Acetaminophen Hives and Rash   Family History  Problem Relation Age of Onset  . Colon cancer  Father   . Diabetes Mother   . Heart attack Mother        20  . Heart attack Brother 43   Prior to Admission medications   Medication Sig Start Date End Date Taking? Authorizing Provider  atorvastatin (LIPITOR) 80 MG tablet TAKE 1 TABLET (80 MG TOTAL) BY MOUTH DAILY AT 6 PM. 04/03/20  Yes [name], MD  losartan (COZAAR) 50 MG tablet Take 1 tablet (50 mg total) by mouth daily. 06/10/20 09/08/20 Yes Burtis Junes, NP  metoprolol tartrate (LOPRESSOR) 25 MG tablet Take 0.5 tablets (12.5 mg total) by mouth at bedtime. 05/26/20  Yes Nahser, Wonda Cheng, MD  PRADAXA 150 MG CAPS capsule TAKE 1 CAPSULE (150 MG TOTAL) BY MOUTH EVERY 12 (TWELVE) HOURS. Patient taking differently: Take 150 mg by mouth 2 (two) times daily. 04/03/20  Yes [name], MD  diltiazem (CARDIZEM) 30 MG tablet Take 1 tablet (30 mg total) by mouth 4 (four) times daily as needed. Prn Patient not taking: No sig reported 02/07/20   Nahser, Wonda Cheng, MD   Physical Exam: Vitals:   06/28/20 0400 06/28/20 0415 06/28/20 0430 06/28/20 0445  BP: 118/68 (!) 107/58 (!) 103/57 104/64  Pulse: 70 70 70 70  Resp: (!) 24 (!) 25 (!) 25 (!) 24  Temp:      TempSrc:      SpO2: 100% 98% 97% 99%  Weight:      Height:       Constitutional: Sedated.  In NAD. Eyes: PERRL, lids and conjunctivae normal ENMT: Mucous membranes are moist. Posterior pharynx clear of any exudate or lesions. Neck: normal, supple, no masses, no thyromegaly Respiratory: clear to auscultation bilaterally, no wheezing, no crackles. Normal respiratory effort. No accessory muscle use.  Cardiovascular: Regular rate and rhythm, no murmurs / rubs / gallops. No extremity edema. 2+ pedal pulses. No carotid bruits.  Abdomen: No distention.  Bowel sounds positive.  Soft, no tenderness, no masses palpated. No hepatosplenomegaly. Musculoskeletal: no clubbing / cyanosis. Good ROM, no contractures. Normal muscle tone.  Skin: no rashes, lesions, ulcers on very limited  dermatological examination. Neurologic: Sedated.  Unable to fully evaluate. Psychiatric: Sedated.  Labs on Admission: I have personally reviewed following labs and imaging studies  CBC: Recent Labs  Lab 06/28/20 0244 06/28/20 0254  WBC 6.1  --   NEUTROABS 3.2  --   HGB 14.6 16.0*  HCT 45.8 47.0*  MCV 92.7  --   PLT 209  --    Basic Metabolic Panel: Recent Labs  Lab 06/28/20 0244 06/28/20 0254  NA 138 139  K 4.0 4.1  CL 100 103  CO2 25  --   GLUCOSE 155* 148*  BUN 13 18  CREATININE 0.70 0.70  CALCIUM 9.2  --    GFR: Estimated Creatinine Clearance: 50.8 mL/min (by C-G formula based on SCr of 0.7 mg/dL).  Liver Function Tests: Recent Labs  Lab 06/28/20 0244  AST 25  ALT 22  ALKPHOS 68  BILITOT 1.4*  PROT 6.5  ALBUMIN 3.7   Radiological Exams on Admission: CT HEAD CODE STROKE WO  CONTRAST  Result Date: 06/28/2020 CLINICAL DATA:  Code stroke. Initial evaluation for acute left-sided weakness. EXAM: CT HEAD WITHOUT CONTRAST TECHNIQUE: Contiguous axial images were obtained from the base of the skull through the vertex without intravenous contrast. COMPARISON:  Previous study from 01/09/2020. FINDINGS: Brain: Generalized age-related cerebral atrophy with moderate chronic microvascular ischemic disease. Remote posterior right MCA distribution infarct. Additional chronic right cerebellar infarct. No acute intracranial hemorrhage. No acute large vessel territory infarct. No mass lesion, midline shift or mass effect. No hydrocephalus or extra-axial fluid collection. Vascular: No visible hyperdense vessel. Calcified atherosclerosis at the skull base. Skull: Scalp soft tissues and calvarium within normal limits. Sinuses/Orbits: Globes and orbital soft tissues demonstrate no acute finding. Left gaze noted. Prior ocular lens replacement on the left. Paranasal sinuses are clear. No mastoid effusion. Other: None. ASPECTS [facility] Stroke Program Early CT Score) - Ganglionic level  infarction (caudate, lentiform nuclei, internal capsule, insula, M1-M3 cortex): 7 - Supraganglionic infarction (M4-M6 cortex): 3 Total score (0-10 with 10 being normal): 10 IMPRESSION: 1. No acute intracranial infarct or other abnormality. 2. ASPECTS is 10. 3. 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 4. Underlying atrophy with chronic small vessel ischemic disease. These results were communicated to Dr. Theda Sers at 3:05 amon 3/6/2022by text page via the [facility] messaging system. Electronically Signed   By: Jeannine Boga M.D.   On: 06/28/2020 03:07   CT ANGIO HEAD CODE STROKE  Result Date: 06/28/2020 CLINICAL DATA:  Initial evaluation for acute stroke. EXAM: CT ANGIOGRAPHY HEAD AND NECK TECHNIQUE: Multidetector CT imaging of the head and neck was performed using the standard protocol during bolus administration of intravenous contrast. Multiplanar CT image reconstructions and MIPs were obtained to evaluate the vascular anatomy. Carotid stenosis measurements (when applicable) are obtained utilizing NASCET criteria, using the distal internal carotid diameter as the denominator. CONTRAST:  125mL OMNIPAQUE IOHEXOL 350 MG/ML SOLN COMPARISON:  Comparison made with prior CT from earlier same day as well as prior CTA from 05/31/2018. FINDINGS: CTA NECK FINDINGS Aortic arch: Visualized aortic arch of normal caliber with normal 3 vessel branching pattern. Atheromatous change about the arch with no hemodynamically significant stenosis about the origin the great vessels. Right carotid system: Right common and internal carotid arteries patent without stenosis or dissection. Predominantly sub concentric soft plaque about the origin of the right ICA with no more than mild 40% stenosis, stable. Left carotid system: Left common and internal carotid arteries patent without hemodynamically significant stenosis or dissection. Mild calcified plaque about the left carotid bulb without significant stenosis.  Vertebral arteries: Both vertebral arteries arise from the subclavian arteries. No hemodynamically significant proximal subclavian artery stenosis. 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 Minimally progressive plaque at the origin of the right vertebral artery without significant stenosis. Vertebral arteries otherwise patent within the neck without stenosis or dissection. Skeleton: No visible acute osseous abnormality. No discrete or worrisome osseous lesions. Other neck: No other acute soft tissue abnormality within the neck. Right submandibular gland either absent or fatty and atrophic in nature. No other mass or adenopathy. Few scattered subcentimeter thyroid nodules measure up to 8 mm, felt to be of doubtful significance given size and patient age. No follow-up imaging recommended (ref: J Am Coll Radiol. 2015 Feb;12(2): 143-50). Upper chest: Visualized upper chest demonstrates no acute finding. Left-sided pacemaker/AICD. 5 mm left upper lobe nodule noted (series 5, image 165), indeterminate. Review of the MIP images confirms the above findings CTA  HEAD FINDINGS Anterior circulation: Petrous segments widely patent. Atheromatous change within the carotid siphons with associated mild-to-moderate stenosis, relatively stable. 3 mm focal outpouching at the supraclinoid right ICA stable from previous and felt to be most consistent with a small vascular infundibulum. A1 segments patent bilaterally. Normal anterior communicating artery complex. Anterior cerebral arteries mildly irregular but patent to their distal aspects without stenosis. No M1 stenosis or occlusion. Normal MCA bifurcations. No proximal MCA branch occlusion. Distal MCA branches well perfused. Posterior circulation: Both V4 segments patent to the vertebrobasilar junction without flow-limiting stenosis. Both PICA origins patent and normal. Basilar patent to its distal aspect without stenosis. Superior  cerebellar arteries patent bilaterally. Right PCA supplied via the basilar. Left PCA supplied via a hypoplastic left P1 segment and robust left posterior communicating artery. Short-segment moderate to severe right P2 stenosis again noted, stable (series 7, image 117). PCAs otherwise well perfused to their distal aspects. Venous sinuses: Grossly patent allowing for timing the contrast bolus. Anatomic variants: None significant. Review of the MIP images confirms the above findings IMPRESSION: 1. No emergent large vessel occlusion. 2. Relatively stable CTA as compared to 2020. 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 3. 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 4. Widely patent carotid arteries within the neck. 5.  Aortic Atherosclerosis (ICD10-I70.0). 6. 5 mm left upper lobe nodule, indeterminate. No follow-up needed if patient is low-risk. Non-contrast chest CT can be considered in 12 months if patient is high-risk. This recommendation follows the consensus statement: Guidelines for Management of Incidental Pulmonary Nodules Detected on CT Images: From the Fleischner Society 2017; Radiology 2017; 284:228-243. These results were communicated to Dr. Theda Sers at 3:25 amon 3/6/2022by text page via the [facility] messaging system. Electronically Signed   By: Jeannine Boga M.D.   On: 06/28/2020 03:43   CT ANGIO NECK CODE STROKE  Result Date: 06/28/2020 CLINICAL DATA:  Initial evaluation for acute stroke. EXAM: CT ANGIOGRAPHY HEAD AND NECK TECHNIQUE: Multidetector CT imaging of the head and neck was performed using the standard protocol during bolus administration of intravenous contrast. Multiplanar CT image reconstructions and MIPs were obtained to evaluate the vascular anatomy. Carotid stenosis measurements (when applicable) are obtained utilizing NASCET criteria, using the distal internal carotid diameter as  the denominator. CONTRAST:  169mL OMNIPAQUE IOHEXOL 350 MG/ML SOLN COMPARISON:  Comparison made with prior CT from earlier same day as well as prior CTA from 05/31/2018. FINDINGS: CTA NECK FINDINGS Aortic arch: Visualized aortic arch of normal caliber with normal 3 vessel branching pattern. Atheromatous change about the arch with no hemodynamically significant stenosis about the origin the great vessels. Right carotid system: Right common and internal carotid arteries patent without stenosis or dissection. Predominantly sub concentric soft plaque about the origin of the right ICA with no more than mild 40% stenosis, stable. Left carotid system: Left common and internal carotid arteries patent without hemodynamically significant stenosis or dissection. Mild calcified plaque about the left carotid bulb without significant stenosis. Vertebral arteries: Both vertebral arteries arise from the subclavian arteries. No hemodynamically significant proximal subclavian artery stenosis. 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 Minimally progressive plaque at the origin of the right vertebral artery without significant stenosis. Vertebral arteries otherwise patent within the neck without stenosis or dissection. Skeleton: No visible acute osseous abnormality. No discrete or worrisome osseous lesions. Other neck: No other acute soft tissue abnormality  within the neck. Right submandibular gland either absent or fatty and atrophic in nature. No other mass or adenopathy. Few scattered subcentimeter thyroid nodules measure up to 8 mm, felt to be of doubtful significance given size and patient age. No follow-up imaging recommended (ref: J Am Coll Radiol. 2015 Feb;12(2): 143-50). Upper chest: Visualized upper chest demonstrates no acute finding. Left-sided pacemaker/AICD. 5 mm left upper lobe nodule noted (series 5, image 165), indeterminate. Review of the MIP images confirms the above  findings CTA HEAD FINDINGS Anterior circulation: Petrous segments widely patent. Atheromatous change within the carotid siphons with associated mild-to-moderate stenosis, relatively stable. 3 mm focal outpouching at the supraclinoid right ICA stable from previous and felt to be most consistent with a small vascular infundibulum. A1 segments patent bilaterally. Normal anterior communicating artery complex. Anterior cerebral arteries mildly irregular but patent to their distal aspects without stenosis. No M1 stenosis or occlusion. Normal MCA bifurcations. No proximal MCA branch occlusion. Distal MCA branches well perfused. Posterior circulation: Both V4 segments patent to the vertebrobasilar junction without flow-limiting stenosis. Both PICA origins patent and normal. Basilar patent to its distal aspect without stenosis. Superior cerebellar arteries patent bilaterally. Right PCA supplied via the basilar. Left PCA supplied via a hypoplastic left P1 segment and robust left posterior communicating artery. Short-segment moderate to severe right P2 stenosis again noted, stable (series 7, image 117). PCAs otherwise well perfused to their distal aspects. Venous sinuses: Grossly patent allowing for timing the contrast bolus. Anatomic variants: None significant. Review of the MIP images confirms the above findings IMPRESSION: 1. No emergent large vessel occlusion. 2. Relatively stable CTA as compared to 2020. 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 3. 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 4. Widely patent carotid arteries within the neck. 5.  Aortic Atherosclerosis (ICD10-I70.0). 6. 5 mm left upper lobe nodule, indeterminate. No follow-up needed if patient is low-risk. Non-contrast chest CT can be considered in 12 months if patient is high-risk. This recommendation follows the consensus statement: Guidelines for  Management of Incidental Pulmonary Nodules Detected on CT Images: From the Fleischner Society 2017; Radiology 2017; 284:228-243. These results were communicated to Dr. Theda Sers at 3:25 amon 3/6/2022by text page via the [facility] messaging system. Electronically Signed   By: Jeannine Boga M.D.   On: 06/28/2020 03:43    EKG: Independently reviewed.  Vent. rate 70 BPM PR interval * ms QRS duration 144 ms QT/QTc 454/490 ms P-R-T axes 0 9 198 Sinus rhythm Left bundle branch block  Assessment/Plan Principal Problem:   New onset seizure ([place]) Likely CVA recrudescence. Observation/telemetry. Obtain EEG. Obtain MRI of brain. Antiepileptic meds per neurology.  Active Problems:   Essential hypertension Hold losartan and metoprolol. May resume after MRI imaging/neuro evaluation. Monitor blood pressure and heart rate.    Hyperlipidemia LDL goal <70 Continue atorvastatin 80 mg p.o. daily.    Prediabetes Check hemoglobin A1c. Carbohydrate modified diet once awake.    Paroxysmal atrial fibrillation (HCC) Will continue Pradaxa after discussing it with neurology.    Hemiplegia and hemiparesis following cerebral infarction affecting left non-dominant side ([place]) Supportive care. Consult PT/OT.     DVT prophylaxis: On Pradaxa. Code Status:   Full code. Family Communication: Disposition Plan:   Patient is from:  Home.  Anticipated DC to:  Home.  Anticipated DC date:  06/29/2020.  Anticipated DC barriers: Clinical status and pending work-up.  Consults called:  Neuro hospitalist  team Lynnae Sandhoff, MD). Admission status:  Observation/telemetry.  Severity of Illness:  High due to new onset seizure activity, likely due to previous CVA with left-sided hemiplegia.  The patient will need to remain in the hospital for at least 24 to 48 hours for close monitoring, including frequent neurochecks and further work-up.  Reubin Milan MD [facility]  How to contact the [facility]  Attending or Consulting provider [place] or covering provider during after hours [place], for this patient?   1. Check the care team in [facility] and look for a) attending/consulting [facility] provider listed and b) the [facility] team listed 2. Log into [email or web address] and use [place]'s universal password to access. If you do not have the password, please contact the hospital operator. 3. Locate the [facility] provider you are looking for under [facility] and page to a number that you can be directly reached. 4. If you still have difficulty reaching the provider, please page the [facility] (Director on Call) for the Hospitalists listed on amion for assistance.  06/28/2020, 5:01 AM   This document was prepared using Dragon voice recognition software and may contain some unintended transcription errors.

## 2020-06-28 NOTE — ED Notes (Addendum)
Transported to CT scan

## 2020-06-28 NOTE — Progress Notes (Signed)
Occupational Therapy Evaluation Patient Details Name: Mary Mercado MRN: [id number] DOB: 1940-10-22 Today's Date: 06/28/2020    History of Present Illness This 80 y.o. female presented to ED with Lt UE twitching associated with Rt sided HA.  CT of head showed no acute infarct.  MRI pending.  PMH includes:  h/o Rt parietal CVA, and Rt cerebellar CVA, HTN, A-Fib, BPPV, s/p pacemaker implant.   Clinical Impression   Pt admitted with above. She demonstrates the below listed deficits and will benefit from continued OT to maximize safety and independence with BADLs.  PT presents to OT with generalized weakness, impaired cognition, impaired balance, decreased activity tolerance, and Lt inattention. 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  She currently requires min - mod A for ADLs, and min A for functional mobility - Lt hand frequently falls off the RW.  She reports she lives with spouse and she was mod I with ADLs with spouse assisting with IADLs.  Feel she will require 24 hour direct assist/supervision with all ADLs and mobility, at least initially.  Recommend OPOT at discharge.  Will follow acutely.       Follow Up Recommendations  Outpatient OT;Supervision/Assistance - 24 hour    Equipment Recommendations  None recommended by OT    Recommendations for Other Services       Precautions / Restrictions Precautions Precautions: Fall Precaution Comments: Lt inattention      Mobility Bed Mobility Overal bed mobility: Needs Assistance Bed Mobility: Supine to Sit;Sit to Supine     Supine to sit: Min assist Sit to supine: Min assist   General bed mobility comments: assist to lift trunk from ED stretcher and to assist feet back onto stretcher    Transfers Overall transfer level: Needs assistance Equipment used: Rolling walker (2 wheeled) Transfers: Sit to/from [facility] Sit to Stand:  Min assist;+2 safety/equipment Stand pivot transfers: Min assist;+2 safety/equipment       General transfer comment: assist for balance and safety, and assist to maintain Lt UE on RW    Balance Overall balance assessment: Needs assistance Sitting-balance support: Feet unsupported;Single extremity supported Sitting balance-Leahy Scale: Fair     Standing balance support: Bilateral upper extremity supported Standing balance-Leahy Scale: Poor Standing balance comment: requires bil. UE support                           ADL either performed or assessed with clinical judgement   ADL Overall ADL's : Needs assistance/impaired Eating/Feeding: Set up;Supervision/ safety;Sitting;Bed level   Grooming: Wash/dry face;Wash/dry hands;Oral care;Brushing hair;Minimal assistance;Standing   Upper Body Bathing: Minimal assistance;Sitting   Lower Body Bathing: Minimal assistance;Sit to/from stand   Upper Body Dressing : Minimal assistance;Sitting   Lower Body Dressing: Sit to/from stand;Moderate assistance   Toilet Transfer: Minimal assistance;Ambulation;BSC;RW   Toileting- Clothing Manipulation and Hygiene: Sit to/from stand;Moderate assistance       Functional mobility during ADLs: Minimal assistance;+2 for safety/equipment;Rolling walker       Vision Baseline Vision/History:  (Per chart, h/o Lt hemianopia) Patient Visual Report: No change from baseline Vision Assessment?: Yes Additional Comments: attempted to assess vision, but pt demonstrated difficulty following multi step commands.  She looses fixation frequently during pursuits, and was inaccurate with peripheral field testing.  She does have h/o Lt hemiopia     Perception Perception Perception Tested?: Yes Perception Deficits: Inattention/neglect Inattention/Neglect: Does not attend to  left side of body Spatial deficits: Lt inattention noted   Praxis Praxis Praxis tested?: Deficits Deficits: Initiation     Pertinent Vitals/Pain Pain Assessment: No/denies pain     Hand Dominance Right   Extremity/Trunk Assessment Upper Extremity Assessment Upper Extremity Assessment: LUE deficits/detail LUE Deficits / Details: AROM grossly WFL.  However, during functional activity, pt with decreased awareness of where Lt UE is in space, and demonstrates difficulty incorporating it fully into activity.  It repeatedly fell off the walker LUE Sensation: decreased proprioception LUE Coordination: decreased fine motor;decreased gross motor   Lower Extremity Assessment Lower Extremity Assessment: Defer to PT evaluation   Cervical / Trunk Assessment Cervical / Trunk Assessment: Kyphotic   Communication Communication Communication: No difficulties   Cognition Arousal/Alertness: Lethargic;Awake/alert Behavior During Therapy: Flat affect Overall Cognitive Status: Impaired/Different from baseline Area of Impairment: Orientation;Attention;Following commands;Safety/judgement;Awareness;Problem solving                 Orientation Level: Disoriented to;Place Current Attention Level: Sustained   Following Commands: Follows one step commands consistently;Follows one step commands with increased time;Follows multi-step commands inconsistently;Follows multi-step commands with increased time   Awareness: Emergent Problem Solving: Slow processing;Decreased initiation;Difficulty sequencing;Requires verbal cues General Comments: Pt very lethargic intially.   She  thought she was at home with no awareness that she was in ED until cued.  She demonstrates slow processing.  Lt inattention noted   General Comments       Exercises     Shoulder Instructions      Home Living Family/patient expects to be discharged to:: Private residence Living Arrangements: Spouse/significant other Available Help at Discharge: Family;Available 24 hours/day Type of Home: House Home Access: Level entry     Home Layout: One  level     Bathroom Shower/Tub: Occupational psychologist: Standard     Home Equipment: Cane - single point;Shower seat;Grab bars - tub/shower;Walker - 4 wheels          Prior Functioning/Environment Level of Independence: Needs assistance  Gait / Transfers Assistance Needed: Uses rollator for mobility tasks. ADL's / Homemaking Assistance Needed: Independent with ADLs, but requires assist for IADLs.            OT Problem List: Decreased activity tolerance;Impaired balance (sitting and/or standing);Impaired vision/perception;Decreased coordination;Decreased cognition;Decreased safety awareness;Decreased knowledge of use of DME or AE;Impaired UE functional use;Impaired sensation      OT Treatment/Interventions: Self-care/ADL training;Neuromuscular education;Energy conservation;DME and/or AE instruction;Therapeutic activities;Cognitive remediation/compensation;Visual/perceptual remediation/compensation;Patient/family education;Balance training    OT Goals(Current goals can be found in the care plan section) Acute Rehab OT Goals Patient Stated Goal: to find out why she is in hospital OT Goal Formulation: With patient Time For Goal Achievement: 07/12/20 Potential to Achieve Goals: Good ADL Goals Pt Will Perform Grooming: with modified independence;standing Pt Will Perform Upper Body Bathing: with set-up;sitting Pt Will Perform Lower Body Bathing: with set-up;with supervision;sit to/from stand Pt Will Perform Upper Body Dressing: with supervision;standing Pt Will Perform Lower Body Dressing: with supervision;sit to/from stand Pt Will Transfer to Toilet: with supervision;ambulating;regular height toilet;bedside commode;grab bars Pt Will Perform Toileting - Clothing Manipulation and hygiene: with supervision;sit to/from stand Additional ADL Goal #1: Pt will locate all needed ADL items without cues.  OT Frequency: Min 2X/week   Barriers to D/C:            Co-evaluation  PT/OT/SLP Co-Evaluation/Treatment: Yes Reason for Co-Treatment: Necessary to address cognition/behavior during functional activity;For patient/therapist safety;To address functional/ADL transfers   OT goals addressed  during session: ADL's and self-care;Strengthening/ROM      AM-PAC OT "6 Clicks" Daily Activity     Outcome Measure Help from another person eating meals?: A Little Help from another person taking care of personal grooming?: A Little Help from another person toileting, which includes using toliet, bedpan, or urinal?: A Little Help from another person bathing (including washing, rinsing, drying)?: A Little Help from another person to put on and taking off regular upper body clothing?: A Little Help from another person to put on and taking off regular lower body clothing?: A Lot 6 Click Score: 17   End of Session Equipment Utilized During Treatment: Gait belt;Rolling walker Nurse Communication: Mobility status  Activity Tolerance: Patient tolerated treatment well Patient left: in bed;with call bell/phone within reach  OT Visit Diagnosis: Unsteadiness on feet (R26.81);Cognitive communication deficit (R41.841) Symptoms and signs involving cognitive functions: Cerebral infarction                Time: 8588-5027 OT Time Calculation (min): 20 min Charges:  OT General Charges $OT Visit: 1 Visit OT Evaluation $OT Eval Moderate Complexity: 1 Mod  Nilsa Nutting., OTR/L Acute Rehabilitation Services Pager [phone] Office [phone]   Lucille Passy M 06/28/2020, 10:29 AM

## 2020-06-28 NOTE — ED Notes (Signed)
Pt up to the restroom with 1 person assist.

## 2020-06-28 NOTE — Consult Note (Addendum)
Neurology Consult H&P  [name] MR# [id number] 06/28/2020  CC: code stroke  History is obtained from: Patient, EMS, chart  HPI: Mary Mercado is a 80 y.o. female right handed PMHx as reviewed below, stroke with residual left-sided weakness and hemianopsia with sudden onset speech difficulty/stuttering and left upper extremity twitching. Last seen normal at around 2300. During transport, EMS report that she started to complain of some right-sided headache retro orbital 2/10.  Denies head trauma, f/c/n/v, sob/ cp  LKW: 2300 tpa given: No  IR Thrombectomy No,  Modified Rankin Scale: 1-No significant post stroke disability and can perform usual duties with stroke symptoms NIHSS: 4 - Old deficits  ROS: A complete ROS was performed and is negative except as noted in the HPI.   Past Medical History:  Diagnosis Date  . Allergy   . CVA (cerebral vascular accident) ([place]) 05/2018, 01/09/20  . Hyperlipidemia LDL goal <70   . Hypertension   . Palpitations   . Vertigo, benign positional      Family History  Problem Relation Age of Onset  . Colon cancer Father   . Diabetes Mother   . Heart attack Mother        32  . Heart attack Brother 21    Social History:  reports that she has never smoked. She has never used smokeless tobacco. She reports that she does not drink alcohol and does not use drugs.   Prior to Admission medications   Medication Sig Start Date End Date Taking? Authorizing Provider  atorvastatin (LIPITOR) 80 MG tablet TAKE 1 TABLET (80 MG TOTAL) BY MOUTH DAILY AT 6 PM. 04/03/20   [name], MD  diltiazem (CARDIZEM) 30 MG tablet Take 1 tablet (30 mg total) by mouth 4 (four) times daily as needed. Prn 02/07/20   Nahser, Wonda Cheng, MD  losartan (COZAAR) 50 MG tablet Take 1 tablet (50 mg total) by mouth daily. 06/10/20 09/08/20  Burtis Junes, NP  metoprolol tartrate (LOPRESSOR) 25 MG tablet Take 0.5 tablets (12.5 mg total) by mouth at  bedtime. 05/26/20   Nahser, Wonda Cheng, MD  PRADAXA 150 MG CAPS capsule TAKE 1 CAPSULE (150 MG TOTAL) BY MOUTH EVERY 12 (TWELVE) HOURS. 04/03/20   [name], MD    Exam: Current vital signs: There were no vitals taken for this visit.  Physical Exam  Constitutional: Appears well-developed and well-nourished.  Psych: Affect appropriate to situation Eyes: No scleral injection HENT: No OP obstruction. Head: Normocephalic.  Cardiovascular: Normal rate and regular rhythm.  Respiratory: Effort normal, symmetric excursions bilaterally, no audible wheezing. GI: Soft.  No distension. There is no tenderness.  Skin: WDI  Neuro: Mental Status: Patient is awake, alert, oriented to person, place, month, year, and situation. Patient is able to give a clear and coherent history. Speech fluent, intact comprehension and repetition. No signs of aphasia or neglect. Visual Fields left field cut. Pupils are equal, round, and reactive to light. EOMI without ptosis or diploplia.  Facial sensation is symmetric to temperature Facial movement is symmetric.  Hearing is intact to voice. Uvula midline and palate elevates symmetrically. Shoulder shrug is symmetric. Tongue is midline without atrophy or fasciculations.  Tone is increase on left. Bulk is normal. 4-/5 left and lower extremities. Sensation is symmetric to light touch and temperature in the arms and legs. Deep Tendon Reflexes: ~3+ in left. Babinski (+)L FNF and HKS are intact bilaterally. Gait - Deferred  I have reviewed labs in epic and  the pertinent results are:   Ref. Range 06/28/2020 02:44  Glucose Latest Ref Range: 70 - 99 mg/dL 155 (H)    I have reviewed the images obtained: NCT head showed No acute ischemic changes or other abnormality ASPECTS 10. 3. 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 CTA head and neck showed no large vessel occlusion.  Assessment: Mary Mercado is a 80 y.o. female right handed  PMHx as above and stroke with residual deficits with normal BP PTA arrives with left upper extremity twitching and left sided weakness. Patient states symptoms are similar to the symptoms she experienced with previous stroke. Twitching largely resolved with lorazepam except for left thumb twitching and the patient was loaded with LEV. The patient will need further stroke evaluation to determine recrudescence versus extension/new stroke. She will also need to be evaluated for seizure to determine if the seizure was provoked or not.  Impression:  Rule out new stroke/extnesion of old stroke. Hypertensive emergency Recrudescence of old stroke symptoms. 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 - Recommend vascular imaging with MRA head and neck. - Recommend TTE. - Recommend labs: HbA1c, lipid panel, TSH. - Recommend Statin if LDL > 70 - Continue dabigatran. - Permissive hypertension first 24 h < 220/110.  - Telemetry monitoring for arrhythmia. - Recommend bedside Swallow screen. - Recommend Stroke education. - Recommend PT/OT/SLP consult. - Routine EEG to eval for any epileptogenic discharges and will decide if cEEG necessary. - Recommend metabolic/infectious workup with CBC, CMP, UA with UCx, CXR, CK, serum lactate.  This patient is critically ill and at significant risk of neurological worsening, death and care requires constant monitoring of vital signs, hemodynamics,respiratory and cardiac monitoring, neurological assessment, discussion with family, other specialists and medical decision making of high complexity. I spent 73 minutes of neurocritical care time  in the care of  this patient. This was time spent independent of any time provided by nurse practitioner or PA.  Electronically signed by:  Lynnae Sandhoff, MD Page:  [phone] 06/28/2020, 2:51 AM

## 2020-06-28 NOTE — Progress Notes (Signed)
SLP Cancellation Note  Patient Details Name: Mary Mercado MRN: [id number] DOB: 1940/10/27   Cancelled treatment:       Reason Eval/Treat Not Completed: Fatigue/lethargy limiting ability to participate (Pt's case was discussed with Crystal, RN who reported that the pt was sedated from Ativan and is currently inadequately alert for the bedside swallow evaluation. It was agreed that SLP will follow up later today if contacted regarding improvement in pt's level of alertness.)  Derrek Puff I. Hardin Negus, [place], [place] Office number [phone] Pager [place] 06/28/2020, 11:22 AM

## 2020-06-28 NOTE — Procedures (Signed)
Patient Name: Mary Mercado  MRN: [id number]  Epilepsy Attending: Lora Havens  Referring Physician/Provider: Dr Tennis Must Date: 06/28/2020 Duration: 25.32 mins  Patient history: 80 y.o. female right handed PMHx as above and stroke with residual deficits with normal BP PTA arrives with left upper extremity twitching and left sided weakness. EEG to evaluate for seizure  Level of alertness: Awake, asleep  AEDs during EEG study: Ativan, LEV  Technical aspects: This EEG study was done with scalp electrodes positioned according to the 10-20 International system of electrode placement. Electrical activity was acquired at a sampling rate of 500Hz  and reviewed with a high frequency filter of 70Hz  and a low frequency filter of 1Hz . EEG data were recorded continuously and digitally stored.   Description: No clear posterior dominant rhythm was seen. Sleep was characterized by vertex waves, sleep spindles (12 to 14 Hz), maximal frontocentral region.  There is an excessive amount of 15 to 18 Hz beta activity with irregular morphology distributed symmetrically and diffusely. EEG also showed continuous 3 to 6 Hz theta-delta slowing in right temporal region which at times appears rhythmic. Hyperventilation and photic stimulation were not performed.     ABNORMALITY -Continuous slow, right temporal region  IMPRESSION: This study is suggestive of cortical dysfunction in right temporal region likely secondary to underlying stroke. No seizures or epileptiform discharges were seen throughout the recording.  Naw Lasala Barbra Sarks

## 2020-06-28 NOTE — Progress Notes (Signed)
EEG complete - results pending 

## 2020-06-28 NOTE — Evaluation (Signed)
Clinical/Bedside Swallow Evaluation Patient Details  Name: Mary Mercado MRN: [id number] Date of Birth: August 12, 1940  Today's Date: 06/28/2020 Time: SLP Start Time (ACUTE ONLY): 1221 SLP Stop Time (ACUTE ONLY): 1242 SLP Time Calculation (min) (ACUTE ONLY): 20.8 min  Past Medical History:  Past Medical History:  Diagnosis Date  . Allergy   . CVA (cerebral vascular accident) ([place]) 05/2018, 01/09/20  . Hyperlipidemia LDL goal <70   . Hypertension   . Palpitations   . Vertigo, benign positional    Past Surgical History:  Past Surgical History:  Procedure Laterality Date  . AV NODE ABLATION N/A 03/30/2020   Procedure: AV NODE ABLATION;  Surgeon: Deboraha Sprang, MD;  Location: [place] [facility];  Service: Cardiovascular;  Laterality: N/A;  . AV NODE ABLATION N/A 03/30/2020   Procedure: AV NODE ABLATION;  Surgeon: Deboraha Sprang, MD;  Location: [place] [facility];  Service: Cardiovascular;  Laterality: N/A;  . CARDIOVERSION Left 10/31/2018   Procedure: CARDIOVERSION;  Surgeon: Thayer Headings, MD;  Location: [place];  Service: Cardiovascular;  Laterality: Left;  . CARDIOVERSION N/A 01/08/2020   Procedure: CARDIOVERSION;  Surgeon: [name], MD;  Location: [facility] ENDOSCOPY;  Service: Cardiovascular;  Laterality: N/A;  . Hemilaminectomy and microdiskectomy at L4-5 on the left.  12/10/2003  . PACEMAKER IMPLANT N/A 03/30/2020   Procedure: PACEMAKER IMPLANT;  Surgeon: Deboraha Sprang, MD;  Location: [place] [facility];  Service: Cardiovascular;  Laterality: N/A;  . TEE WITHOUT CARDIOVERSION N/A 06/05/2018   Procedure: TRANSESOPHAGEAL ECHOCARDIOGRAM (TEE);  Surgeon: Lelon Perla, MD;  Location: [facility] ENDOSCOPY;  Service: Cardiovascular;  Laterality: N/A;  loop   HPI:  Pt is a 80 y.o. female with medical history significant of seasonal allergies, history of embolic CVA with residual left-sided hemiparesis, hypertension, paroxysmal atrial fibrillation, history of SVT, glucose  intolerance, benign positional vertigo who was brought to the ED via EMS due to left arm twitching and associated with right-sided headache. CT head was negative for acute changes. Pt passed [place] and BSE was subsequently ordered. BSE 01/10/20: functional oropharyngeal swallow. Pt was subsequently seen for dysphagia. EEG 3/6: cortical dysfunction in right temporal region likely secondary to underlying stroke. No seizures or epileptiform discharges were seen.   Assessment / Plan / Recommendation Clinical Impression  Pt was seen for bedside swallow evaluation with her husband present. Pt reported that she is conscious to eat slower since the CVA last year, if not she may "get strangled". However, she reported that she is asymptomtic of oropharyngeal dysphagia if she does this, and that she takes medications with puree. Oral mechanism exam was [facility] and she presented with adequate, natural dentition. She tolerated all solids and liquids without signs or symptoms of oropharyngeal dysphagia. A regular texture diet with thin liquids is recommended at this time and further skilled SLP services are not clinically indicated for swallowing. SLP Visit Diagnosis: Dysphagia, unspecified (R13.10)    Aspiration Risk  Mild aspiration risk    Diet Recommendation Regular;Thin liquid   Liquid Administration via: Cup;Straw Medication Administration: Whole meds with puree Supervision: Staff to assist with self feeding Compensations: Small sips/bites;Slow rate Postural Changes: Seated upright at 90 degrees    Other  Recommendations Oral Care Recommendations: Oral care BID   Follow up Recommendations None      Frequency and Duration            Prognosis        Swallow Study   General  Date of Onset: 06/27/20 HPI: Pt is a 80 y.o. female with medical history significant of seasonal allergies, history of embolic CVA with residual left-sided hemiparesis, hypertension, paroxysmal atrial fibrillation,  history of SVT, glucose intolerance, benign positional vertigo who was brought to the ED via EMS due to left arm twitching and associated with right-sided headache. CT head was negative for acute changes. Pt passed [place] and BSE was subsequently ordered. BSE 01/10/20: functional oropharyngeal swallow. Pt was subsequently seen for dysphagia. EEG 3/6: cortical dysfunction in right temporal region likely secondary to underlying stroke. No seizures or epileptiform discharges were seen. Type of Study: Bedside Swallow Evaluation Previous Swallow Assessment: See HPI Diet Prior to this Study: Regular;NPO Temperature Spikes Noted: No Respiratory Status: Room air History of Recent Intubation: No Behavior/Cognition: Alert;Cooperative;Pleasant mood Oral Cavity Assessment: Within Functional Limits Oral Care Completed by SLP: No Oral Cavity - Dentition: Adequate natural dentition Vision: Functional for self-feeding Self-Feeding Abilities: Needs assist Patient Positioning: Upright in bed;Postural control adequate for testing Baseline Vocal Quality: Normal Volitional Cough: Strong Volitional Swallow: Able to elicit    Oral/Motor/Sensory Function Overall Oral Motor/Sensory Function: Within functional limits   Ice Chips Ice chips: Within functional limits Presentation: Spoon   Thin Liquid Thin Liquid: Within functional limits Presentation: Cup;Straw    Nectar Thick Nectar Thick Liquid: Not tested   Honey Thick Honey Thick Liquid: Not tested   Puree Puree: Within functional limits Presentation: Spoon;Self Fed   Solid     Solid: Within functional limits Presentation: Self Fed     Jeovani Weisenburger I. Hardin Negus, [place], [place] Office number [phone] Pager [phone]  Horton Marshall 06/28/2020,1:16 PM

## 2020-06-29 ENCOUNTER — Observation Stay (HOSPITAL_COMMUNITY): Payer: Medicare Other

## 2020-06-29 ENCOUNTER — Observation Stay (HOSPITAL_BASED_OUTPATIENT_CLINIC_OR_DEPARTMENT_OTHER): Payer: Medicare Other

## 2020-06-29 DIAGNOSIS — R569 Unspecified convulsions: Secondary | ICD-10-CM | POA: Diagnosis not present

## 2020-06-29 DIAGNOSIS — R29818 Other symptoms and signs involving the nervous system: Secondary | ICD-10-CM | POA: Diagnosis not present

## 2020-06-29 DIAGNOSIS — I6389 Other cerebral infarction: Secondary | ICD-10-CM

## 2020-06-29 DIAGNOSIS — I6782 Cerebral ischemia: Secondary | ICD-10-CM | POA: Diagnosis not present

## 2020-06-29 DIAGNOSIS — G9389 Other specified disorders of brain: Secondary | ICD-10-CM | POA: Diagnosis not present

## 2020-06-29 DIAGNOSIS — I480 Paroxysmal atrial fibrillation: Secondary | ICD-10-CM | POA: Diagnosis not present

## 2020-06-29 DIAGNOSIS — I639 Cerebral infarction, unspecified: Secondary | ICD-10-CM | POA: Diagnosis not present

## 2020-06-29 LAB — LIPID PANEL
Cholesterol: 135 mg/dL (ref 0–200)
HDL: 54 mg/dL (ref >40)
LDL Cholesterol: 71 mg/dL (ref 0–99)
Total CHOL/HDL Ratio: 2.5 RATIO
Triglycerides: 50 mg/dL (ref <150)
VLDL: 10 mg/dL (ref 0–40)

## 2020-06-29 LAB — CBC WITH DIFFERENTIAL/PLATELET
Abs Immature Granulocytes: 0.01 10*3/uL (ref 0.00–0.07)
Basophils Absolute: 0.1 10*3/uL (ref 0.0–0.1)
Basophils Relative: 1 %
Eosinophils Absolute: 0.1 10*3/uL (ref 0.0–0.5)
Eosinophils Relative: 2 %
HCT: 41.1 % (ref 36.0–46.0)
Hemoglobin: 13.4 g/dL (ref 12.0–15.0)
Immature Granulocytes: 0 %
Lymphocytes Relative: 31 %
Lymphs Abs: 1.6 10*3/uL (ref 0.7–4.0)
MCH: 29.3 pg (ref 26.0–34.0)
MCHC: 32.6 g/dL (ref 30.0–36.0)
MCV: 89.9 fL (ref 80.0–100.0)
Monocytes Absolute: 0.5 10*3/uL (ref 0.1–1.0)
Monocytes Relative: 11 %
Neutro Abs: 2.8 10*3/uL (ref 1.7–7.7)
Neutrophils Relative %: 55 %
Platelets: 195 10*3/uL (ref 150–400)
RBC: 4.57 MIL/uL (ref 3.87–5.11)
RDW: 12.9 % (ref 11.5–15.5)
WBC: 5 10*3/uL (ref 4.0–10.5)
nRBC: 0 % (ref 0.0–0.2)

## 2020-06-29 LAB — GLUCOSE, CAPILLARY
Glucose-Capillary: 101 mg/dL — ABNORMAL HIGH (ref 70–99)
Glucose-Capillary: 107 mg/dL — ABNORMAL HIGH (ref 70–99)

## 2020-06-29 LAB — ECHOCARDIOGRAM COMPLETE
Calc EF: 56.4 %
Height: 66 in
S' Lateral: 2.9 cm
Single Plane A2C EF: 58.6 %
Single Plane A4C EF: 51.1 %
Weight: 2056.45 oz

## 2020-06-29 LAB — BASIC METABOLIC PANEL
Anion gap: 10 (ref 5–15)
BUN: 11 mg/dL (ref 8–23)
CO2: 23 mmol/L (ref 22–32)
Calcium: 8.5 mg/dL — ABNORMAL LOW (ref 8.9–10.3)
Chloride: 103 mmol/L (ref 98–111)
Creatinine, Ser: 0.63 mg/dL (ref 0.44–1.00)
GFR, Estimated: >60 mL/min (ref >60)
Glucose, Bld: 93 mg/dL (ref 70–99)
Potassium: 3.5 mmol/L (ref 3.5–5.1)
Sodium: 136 mmol/L (ref 135–145)

## 2020-06-29 LAB — MAGNESIUM: Magnesium: 1.8 mg/dL (ref 1.7–2.4)

## 2020-06-29 MED ORDER — LEVETIRACETAM 500 MG PO TABS: 500.0000 mg | ORAL_TABLET | Freq: Two times a day (BID) | ORAL | 3 refills | Status: DC

## 2020-06-29 NOTE — Discharge Summary (Signed)
Physician Discharge Summary   Mary Mercado:[id number] DOB: 30-Jun-1940 DOA: 06/28/2020  PCP: Mary Morale, MD  Admit date: 06/28/2020 Discharge date:    Admitted From: home Disposition:  home Discharging physician: Mary Dee, MD  Recommendations for Outpatient Follow-up:  1. Follow up with neuro; continued on keppra at discharge  Home Health: PT/OT  Patient discharged to home in Discharge Condition: stable Risk of unplanned readmission score:    CODE STATUS: Full Diet recommendation:  Diet Orders (From admission, onward)    Start     Ordered   06/29/20 0000  Diet general        06/29/20 1513   06/29/20 0000  Diet - low sodium heart healthy        06/29/20 1518   06/28/20 1243  Diet regular Room service appropriate? Yes with Assist; Fluid consistency: Thin  Diet effective now       Question Answer Comment  Room service appropriate? Yes with Assist   Fluid consistency: Thin      06/28/20 1242          Mercado Course: Mary Mercado is an 80 year Mary female with PMH seasonal allergies, history of embolic CVA with residual left-sided hemiparesis, hypertension, PAF, history of SVT, glucose intolerance, benign positional vertigo who was brought to the emergency department via EMS due to left arm twitching and associated with right-sided headache that begun when the patient woke up to go to the bathroom and was unable to use her walker normally.   Symptoms were similar to her previous event.   There was concern for seizure activity and she was started on Ativan and Keppra load. She was evaluated by neurology with recommendations for continuing stroke work-up and obtaining EEG. CT head showed no acute stroke.  Chronic right posterior MCA and right cerebellar infarcts were noted. CTA head/neck showed no LVO.  Stable CTA compared to 2020.  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  MRI brain was negative for stroke.  She was recommended to continue on keppra at discharge and will follow up with neurology.    * New onset seizure Mary Mercado) - concern for underlying Mary CVA contributing - s/p ativan and Keppra in ER - follow up EEG results: "uggestive of cortical dysfunction in right temporal region likely secondary to underlying stroke. No seizures or epileptiform discharges were seen throughout the recording." - follow up MRI brain: no new stroke; encephalomalacia noted  - SLP eval done; regular diet - neuro recommends to continue keppra at discharge  Hemiplegia and hemiparesis following cerebral infarction affecting left non-dominant side (HCC) -Weakness seems to be at baseline on exam. -Follow-up PT/OT -So far, home health recommended with 24/7 supervision and she states her husband does take care of her at home  Paroxysmal atrial fibrillation (HCC) -Continue Pradaxa  Prediabetes -Last A1c 5.9% on 01/10/2020 -Continue SSI and CBG monitoring while hospitalized  Hyperlipidemia LDL goal <70 -Continue Lipitor  Essential hypertension - permissive HTN initially - BP remains low/normal without intervention     The patient's chronic medical conditions were treated accordingly per the patient's home medication regimen except as noted.  On day of discharge, patient was felt deemed stable for discharge. Patient/family member advised to call PCP or come back to ER if needed.   Principal Diagnosis: New onset seizure Mary Mercado)  Discharge Diagnoses: Active Mercado Problems   Diagnosis  Date Noted  . New onset seizure (Mary Mercado) 06/28/2020    Priority: High  . Hemiplegia and hemiparesis following cerebral infarction affecting left non-dominant side (Mary Mercado) 02/05/2020  . Paroxysmal atrial fibrillation (Mary Mercado) 08/13/2018  . Prediabetes 06/15/2018  . Essential hypertension 06/05/2018  . Hyperlipidemia LDL goal  <70 06/05/2018    Resolved Mercado Problems  No resolved problems to display.    Discharge Instructions    Diet - low sodium heart healthy   Complete by: As directed    Diet general   Complete by: As directed    Increase activity slowly   Complete by: As directed    Increase activity slowly   Complete by: As directed      Allergies as of 06/29/2020      Reactions   Duloxetine    Causes Bp elevation.  Increased body jerks, nausea   Acetaminophen Hives, Rash      Medication List    STOP taking these medications   diltiazem 30 MG tablet Commonly known as: CARDIZEM     TAKE these medications   atorvastatin 80 MG tablet Commonly known as: LIPITOR TAKE 1 TABLET (80 MG TOTAL) BY MOUTH DAILY AT 6 PM.   levETIRAcetam 500 MG tablet Commonly known as: KEPPRA Take 1 tablet (500 mg total) by mouth 2 (two) times daily.   losartan 50 MG tablet Commonly known as: COZAAR Take 1 tablet (50 mg total) by mouth daily.   metoprolol tartrate 25 MG tablet Commonly known as: LOPRESSOR Take 0.5 tablets (12.5 mg total) by mouth at bedtime.   Pradaxa 150 MG Caps capsule Generic drug: dabigatran TAKE 1 CAPSULE (150 MG TOTAL) BY MOUTH EVERY 12 (TWELVE) HOURS. What changed: See the new instructions.       Follow-up Information    Mary Fila, MD. Schedule an appointment as soon as possible for a visit in 4 week(s).   Specialties: Neurology, Radiology Contact information: [address]  [place] [address] [phone]              Allergies  Allergen Reactions  . Duloxetine     Causes Bp elevation.  Increased body jerks, nausea  . Acetaminophen Hives and Rash    Consultations: Neuro  Discharge Exam: BP 116/71 (BP Location: Left Arm)   Pulse 70   Temp 98.3 F (36.8 C) (Oral)   Resp 16   Ht 5\' 6"  (1.676 m)   Wt 58.3 kg   SpO2 95%   BMI 20.74 kg/m  General appearance: alert, cooperative and no distress Head: Normocephalic, without obvious  abnormality, atraumatic Eyes: EOMI, PERRL Lungs: clear to auscultation bilaterally Heart: regular rate and rhythm and S1, S2 normal Abdomen: normal findings: bowel sounds normal and soft, non-tender Extremities: no edema Skin: mobility and turgor normal Neurologic: 4+/5 LUE strength; sensation intact throughout; mild dysmetria appreciated this morning with left hand finger to nose   The results of significant diagnostics from this hospitalization (including imaging, microbiology, ancillary and laboratory) are listed below for reference.   Microbiology: Recent Results (from the past 240 hour(s))  Resp Panel by RT-PCR (Flu A&B, Covid) Nasopharyngeal Swab     Status: None   Collection Time: 06/28/20  3:49 AM   Specimen: Nasopharyngeal Swab; Nasopharyngeal(NP) swabs in vial transport medium  Result Value Ref Range Status   SARS Coronavirus 2 by RT PCR NEGATIVE NEGATIVE Final    Comment: (NOTE) SARS-CoV-2 target nucleic acids are NOT DETECTED.  The SARS-CoV-2 RNA is generally detectable in upper  respiratory specimens during the acute phase of infection. The lowest concentration of SARS-CoV-2 viral copies this assay can detect is 138 copies/mL. A negative result does not preclude SARS-Cov-2 infection and should not be used as the sole basis for treatment or other patient management decisions. A negative result may occur with  improper specimen collection/handling, submission of specimen other than nasopharyngeal swab, presence of viral mutation(s) within the areas targeted by this assay, and inadequate number of viral copies(<138 copies/mL). A negative result must be combined with clinical observations, patient history, and epidemiological information. The expected result is Negative.  Fact Sheet for Patients:  EntrepreneurPulse.com.au  Fact Sheet for Healthcare Providers:  IncredibleEmployment.be  This test is no t yet approved or cleared by the  [place] FDA and  has been authorized for detection and/or diagnosis of SARS-CoV-2 by FDA under an Emergency Use Authorization (EUA). This EUA will remain  in effect (meaning this test can be used) for the duration of the COVID-19 declaration under Section 564(b)(1) of the Act, 21 U.S.C.section 360bbb-3(b)(1), unless the authorization is terminated  or revoked sooner.       Influenza A by PCR NEGATIVE NEGATIVE Final   Influenza B by PCR NEGATIVE NEGATIVE Final    Comment: (NOTE) The Xpert Xpress SARS-CoV-2/FLU/RSV plus assay is intended as an aid in the diagnosis of influenza from Nasopharyngeal swab specimens and should not be used as a sole basis for treatment. Nasal washings and aspirates are unacceptable for Xpert Xpress SARS-CoV-2/FLU/RSV testing.  Fact Sheet for Patients: EntrepreneurPulse.com.au  Fact Sheet for Healthcare Providers: IncredibleEmployment.be  This test is not yet approved or cleared by the [place] FDA and has been authorized for detection and/or diagnosis of SARS-CoV-2 by FDA under an Emergency Use Authorization (EUA). This EUA will remain in effect (meaning this test can be used) for the duration of the COVID-19 declaration under Section 564(b)(1) of the Act, 21 U.S.C. section 360bbb-3(b)(1), unless the authorization is terminated or revoked.  Performed at [facility], [place] [address] [place], [place] [address]      Labs: BNP (last 3 results) No results for input(s): BNP in the last 8760 hours. Basic Metabolic Panel: Recent Labs  Lab 06/28/20 0244 06/28/20 0254 06/29/20 0323  NA 138 139 136  K 4.0 4.1 3.5  CL 100 103 103  CO2 25  --  23  GLUCOSE 155* 148* 93  BUN 13 18 11   CREATININE 0.70 0.70 0.63  CALCIUM 9.2  --  8.5*  MG  --   --  1.8   Liver Function Tests: Recent Labs  Lab 06/28/20 0244  AST 25  ALT 22  ALKPHOS 68  BILITOT 1.4*  PROT 6.5  ALBUMIN 3.7   No results for  input(s): LIPASE, AMYLASE in the last 168 hours. No results for input(s): AMMONIA in the last 168 hours. CBC: Recent Labs  Lab 06/28/20 0244 06/28/20 0254 06/29/20 0323  WBC 6.1  --  5.0  NEUTROABS 3.2  --  2.8  HGB 14.6 16.0* 13.4  HCT 45.8 47.0* 41.1  MCV 92.7  --  89.9  PLT 209  --  195   Cardiac Enzymes: No results for input(s): CKTOTAL, CKMB, CKMBINDEX, TROPONINI in the last 168 hours. BNP: Invalid input(s): POCBNP CBG: Recent Labs  Lab 06/28/20 1302 06/28/20 1837 06/28/20 2105 06/29/20 0552 06/29/20 1126  GLUCAP 105* 92 190* 101* 107*   D-Dimer No results for input(s): DDIMER in the last 72 hours. Hgb A1c Recent Labs  06/28/20 0244  HGBA1C 6.4*   Lipid Profile Recent Labs    06/29/20 0323  CHOL 135  HDL 54  LDLCALC 71  TRIG 50  CHOLHDL 2.5   Thyroid function studies No results for input(s): TSH, T4TOTAL, T3FREE, THYROIDAB in the last 72 hours.  Invalid input(s): FREET3 Anemia work up No results for input(s): VITAMINB12, FOLATE, FERRITIN, TIBC, IRON, RETICCTPCT in the last 72 hours. Urinalysis    Component Value Date/Time   COLORURINE STRAW (A) 01/09/2020 2210   APPEARANCEUR CLEAR 01/09/2020 2210   LABSPEC 1.027 01/09/2020 2210   PHURINE 6.0 01/09/2020 2210   GLUCOSEU >=500 (A) 01/09/2020 2210   HGBUR NEGATIVE 01/09/2020 2210   BILIRUBINUR Negative 02/05/2020 1535   KETONESUR 20 (A) 01/09/2020 2210   PROTEINUR Positive (A) 02/05/2020 1535   PROTEINUR NEGATIVE 01/09/2020 2210   UROBILINOGEN 0.2 02/05/2020 1535   UROBILINOGEN 0.2 02/01/2011 1044   NITRITE Negative 02/05/2020 1535   NITRITE NEGATIVE 01/09/2020 2210   LEUKOCYTESUR Negative 02/05/2020 1535   LEUKOCYTESUR NEGATIVE 01/09/2020 2210   Sepsis Labs Invalid input(s): PROCALCITONIN,  WBC,  LACTICIDVEN Microbiology Recent Results (from the past 240 hour(s))  Resp Panel by RT-PCR (Flu A&B, Covid) Nasopharyngeal Swab     Status: None   Collection Time: 06/28/20  3:49 AM    Specimen: Nasopharyngeal Swab; Nasopharyngeal(NP) swabs in vial transport medium  Result Value Ref Range Status   SARS Coronavirus 2 by RT PCR NEGATIVE NEGATIVE Final    Comment: (NOTE) SARS-CoV-2 target nucleic acids are NOT DETECTED.  The SARS-CoV-2 RNA is generally detectable in upper respiratory specimens during the acute phase of infection. The lowest concentration of SARS-CoV-2 viral copies this assay can detect is 138 copies/mL. A negative result does not preclude SARS-Cov-2 infection and should not be used as the sole basis for treatment or other patient management decisions. A negative result may occur with  improper specimen collection/handling, submission of specimen other than nasopharyngeal swab, presence of viral mutation(s) within the areas targeted by this assay, and inadequate number of viral copies(<138 copies/mL). A negative result must be combined with clinical observations, patient history, and epidemiological information. The expected result is Negative.  Fact Sheet for Patients:  EntrepreneurPulse.com.au  Fact Sheet for Healthcare Providers:  IncredibleEmployment.be  This test is no t yet approved or cleared by the [place] FDA and  has been authorized for detection and/or diagnosis of SARS-CoV-2 by FDA under an Emergency Use Authorization (EUA). This EUA will remain  in effect (meaning this test can be used) for the duration of the COVID-19 declaration under Section 564(b)(1) of the Act, 21 U.S.C.section 360bbb-3(b)(1), unless the authorization is terminated  or revoked sooner.       Influenza A by PCR NEGATIVE NEGATIVE Final   Influenza B by PCR NEGATIVE NEGATIVE Final    Comment: (NOTE) The Xpert Xpress SARS-CoV-2/FLU/RSV plus assay is intended as an aid in the diagnosis of influenza from Nasopharyngeal swab specimens and should not be used as a sole basis for treatment. Nasal washings and aspirates are  unacceptable for Xpert Xpress SARS-CoV-2/FLU/RSV testing.  Fact Sheet for Patients: EntrepreneurPulse.com.au  Fact Sheet for Healthcare Providers: IncredibleEmployment.be  This test is not yet approved or cleared by the [place] FDA and has been authorized for detection and/or diagnosis of SARS-CoV-2 by FDA under an Emergency Use Authorization (EUA). This EUA will remain in effect (meaning this test can be used) for the duration of the COVID-19 declaration under Section 564(b)(1) of the Act, 21 U.S.C.  section 360bbb-3(b)(1), unless the authorization is terminated or revoked.  Performed at [facility], [place] [address], [place], [place] [address]     Procedures/Studies: MR BRAIN WO CONTRAST  Result Date: 06/29/2020 CLINICAL DATA:  Neuro deficit, acute, stroke suspected. EXAM: MRI HEAD WITHOUT CONTRAST TECHNIQUE: Multiplanar, multiecho pulse sequences of the brain and surrounding structures were obtained without intravenous contrast. COMPARISON:  Head CT June 28, 2020 FINDINGS: Brain: No acute infarction, hemorrhage, hydrocephalus, extra-axial collection or mass lesion. 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 Area of encephalomalacia and gliosis in the right parietal lobe. Scattered and confluent foci of T2 hyperintensity are seen white matter of the cerebral hemispheres and within the pons, nonspecific, most likely related to chronic microvascular ischemic changes. Focus of susceptibility artifact in the right cerebellar hemisphere, right basal ganglia and insula, suggesting hemosiderin deposits from prior microhemorrhage Vascular: Normal flow voids. Skull and upper cervical spine: Normal marrow signal. Sinuses/Orbits: Left lens surgery.  Paranasal sinuses are clear Other: None. IMPRESSION: 1. No acute intracranial abnormality. 2. 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 3. Encephalomalacia and  gliosis in the right parietal lobe. 4. Moderate chronic microvascular ischemic changes. Electronically Signed   By: Pedro Earls M.D.   On: 06/29/2020 13:18   EEG adult  Result Date: 06/28/2020 Lora Havens, MD     06/28/2020 12:14 PM Patient Name: Mary Mercado MRN: [id number] Epilepsy Attending: Lora Havens Referring Physician/Provider: Dr Tennis Must Date: 06/28/2020 Duration: 25.32 mins Patient history: 80 y.o. female right handed PMHx as above and stroke with residual deficits with normal BP PTA arrives with left upper extremity twitching and left sided weakness. EEG to evaluate for seizure Level of alertness: Awake, asleep AEDs during EEG study: Ativan, LEV Technical aspects: This EEG study was done with scalp electrodes positioned according to the 10-20 International system of electrode placement. Electrical activity was acquired at a sampling rate of 500Hz  and reviewed with a high frequency filter of 70Hz  and a low frequency filter of 1Hz . EEG data were recorded continuously and digitally stored. Description: No clear posterior dominant rhythm was seen. Sleep was characterized by vertex waves, sleep spindles (12 to 14 Hz), maximal frontocentral region.  There is an excessive amount of 15 to 18 Hz beta activity with irregular morphology distributed symmetrically and diffusely. EEG also showed continuous 3 to 6 Hz theta-delta slowing in right temporal region which at times appears rhythmic. Hyperventilation and photic stimulation were not performed.   ABNORMALITY -Continuous slow, right temporal region IMPRESSION: This study is suggestive of cortical dysfunction in right temporal region likely secondary to underlying stroke. No seizures or epileptiform discharges were seen throughout the recording. Lora Havens   ECHOCARDIOGRAM COMPLETE  Result Date: 06/29/2020    ECHOCARDIOGRAM REPORT   Patient Name:   SAYRE MAZOR [id number] Date of Exam: 06/29/2020 Medical Rec #:   [id number]                 Height:       66.0 in Accession #:    [id number]                Weight:       128.5 lb Date of Birth:  02-Jan-1941                 BSA:          1.657 m Patient Age:    80 years  BP:           123/89 mmHg Patient Gender: F                         HR:           77 bpm. Exam Location:  Inpatient Procedure: 2D Echo, Cardiac Doppler and Color Doppler Indications:    Stroke I63.9  History:        Patient has prior history of Echocardiogram examinations, most                 recent 01/10/2020. Pacemaker, Stroke; Risk Factors:Hypertension,                 Dyslipidemia and Non-Smoker.  Sonographer:    Vickie Epley RDCS Referring Phys: [id number] [place] XU IMPRESSIONS  1. Left ventricular ejection fraction, by estimation, is 60 to 65%. The left ventricle has normal function. The left ventricle has no regional wall motion abnormalities. Left ventricular diastolic parameters are indeterminate.  2. Right ventricular systolic function is normal. The right ventricular size is mildly enlarged. There is normal pulmonary artery systolic pressure. The estimated right ventricular systolic pressure is 76.7 mmHg.  3. Left atrial size was severely dilated.  4. Right atrial size was severely dilated.  5. The mitral valve is degenerative. Trivial mitral valve regurgitation.  6. 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  7. The aortic valve is tricuspid. Aortic valve regurgitation is trivial. No aortic stenosis is present.  8. The inferior vena cava is normal in size with <50% respiratory variability, suggesting right atrial pressure of 8 mmHg. Comparison(s): No significant change from prior study. FINDINGS  Left Ventricle: Left ventricular ejection fraction, by estimation, is 60 to 65%. The left ventricle has normal function. The left ventricle has no regional wall motion abnormalities. The left ventricular internal  cavity size was normal in size. There is  no left ventricular hypertrophy. Left ventricular diastolic parameters are indeterminate. Right Ventricle: The right ventricular size is mildly enlarged. No increase in right ventricular wall thickness. Right ventricular systolic function is normal. There is normal pulmonary artery systolic pressure. The tricuspid regurgitant velocity is 2.26  m/s, and with an assumed right atrial pressure of 8 mmHg, the estimated right ventricular systolic pressure is 20.9 mmHg. Left Atrium: Left atrial size was severely dilated. Right Atrium: Right atrial size was severely dilated. Pericardium: There is no evidence of pericardial effusion. Mitral Valve: The mitral valve is degenerative in appearance. There is mild thickening of the mitral valve leaflet(s). There is mild calcification of the mitral valve leaflet(s). Mild mitral annular calcification. Trivial mitral valve regurgitation. Tricuspid Valve: The tricuspid valve is normal in structure. 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 Aortic Valve: The aortic valve is tricuspid. Aortic valve regurgitation is trivial. No aortic stenosis is present. Pulmonic Valve: The pulmonic valve was normal in structure. Pulmonic valve regurgitation is trivial. Aorta: The aortic root is normal in size and structure. Venous: The inferior vena cava is normal in size with less than 50% respiratory variability, suggesting right atrial pressure of 8 mmHg. IAS/Shunts: No atrial level shunt detected by color flow Doppler.  LEFT VENTRICLE PLAX 2D LVIDd:         4.20 cm LVIDs:         2.90 cm LV PW:  0.60 cm LV IVS:        0.60 cm LVOT diam:     2.10 cm LV SV:         40 LV SV Index:   24 LVOT Area:     3.46 cm  LV Volumes (MOD) LV vol d, MOD A2C: 63.3 ml LV vol d, MOD A4C: 53.4 ml LV vol s, MOD A2C: 26.2 ml LV vol s, MOD A4C: 26.1 ml LV SV MOD A2C:     37.1 ml LV SV  MOD A4C:     53.4 ml LV SV MOD BP:      34.3 ml RIGHT VENTRICLE RV S prime:     10.00 cm/s TAPSE (M-mode): 1.7 cm LEFT ATRIUM           Index       RIGHT ATRIUM           Index LA diam:      3.90 cm 2.35 cm/m  RA Area:     27.90 cm LA Vol (A2C): 50.1 ml 30.23 ml/m RA Volume:   93.40 ml  56.36 ml/m LA Vol (A4C): 63.9 ml 38.56 ml/m  AORTIC VALVE LVOT Vmax:   69.00 cm/s LVOT Vmean:  44.800 cm/s LVOT VTI:    0.116 m  AORTA Ao Root diam: 3.30 cm TRICUSPID VALVE TR Peak grad:   20.4 mmHg TR Vmax:        226.00 cm/s  SHUNTS Systemic VTI:  0.12 m Systemic Diam: 2.10 cm Gwyndolyn Kaufman MD Electronically signed by Gwyndolyn Kaufman MD Signature Date/Time: 06/29/2020/2:01:29 PM    Final    CT HEAD CODE STROKE WO CONTRAST  Result Date: 06/28/2020 CLINICAL DATA:  Code stroke. Initial evaluation for acute left-sided weakness. EXAM: CT HEAD WITHOUT CONTRAST TECHNIQUE: Contiguous axial images were obtained from the base of the skull through the vertex without intravenous contrast. COMPARISON:  Previous study from 01/09/2020. FINDINGS: Brain: Generalized age-related cerebral atrophy with moderate chronic microvascular ischemic disease. Remote posterior right MCA distribution infarct. Additional chronic right cerebellar infarct. No acute intracranial hemorrhage. No acute large vessel territory infarct. No mass lesion, midline shift or mass effect. No hydrocephalus or extra-axial fluid collection. Vascular: No visible hyperdense vessel. Calcified atherosclerosis at the skull base. Skull: Scalp soft tissues and calvarium within normal limits. Sinuses/Orbits: Globes and orbital soft tissues demonstrate no acute finding. Left gaze noted. Prior ocular lens replacement on the left. Paranasal sinuses are clear. No mastoid effusion. Other: None. ASPECTS [facility] Stroke Program Early CT Score) - Ganglionic level infarction (caudate, lentiform nuclei, internal capsule, insula, M1-M3 cortex): 7 - Supraganglionic infarction (M4-M6  cortex): 3 Total score (0-10 with 10 being normal): 10 IMPRESSION: 1. No acute intracranial infarct or other abnormality. 2. ASPECTS is 10. 3. 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 4. Underlying atrophy with chronic small vessel ischemic disease. These results were communicated to Dr. Theda Sers at 3:05 amon 3/6/2022by text page via the [facility] messaging system. Electronically Signed   By: Jeannine Boga M.D.   On: 06/28/2020 03:07   CT ANGIO HEAD CODE STROKE  Result Date: 06/28/2020 CLINICAL DATA:  Initial evaluation for acute stroke. EXAM: CT ANGIOGRAPHY HEAD AND NECK TECHNIQUE: Multidetector CT imaging of the head and neck was performed using the standard protocol during bolus administration of intravenous contrast. Multiplanar CT image reconstructions and MIPs were obtained to evaluate the vascular anatomy. Carotid stenosis measurements (when applicable) are obtained utilizing NASCET criteria, using the distal internal carotid diameter as the  denominator. CONTRAST:  127mL OMNIPAQUE IOHEXOL 350 MG/ML SOLN COMPARISON:  Comparison made with prior CT from earlier same day as well as prior CTA from 05/31/2018. FINDINGS: CTA NECK FINDINGS Aortic arch: Visualized aortic arch of normal caliber with normal 3 vessel branching pattern. Atheromatous change about the arch with no hemodynamically significant stenosis about the origin the great vessels. Right carotid system: Right common and internal carotid arteries patent without stenosis or dissection. Predominantly sub concentric soft plaque about the origin of the right ICA with no more than mild 40% stenosis, stable. Left carotid system: Left common and internal carotid arteries patent without hemodynamically significant stenosis or dissection. Mild calcified plaque about the left carotid bulb without significant stenosis. Vertebral arteries: Both vertebral arteries arise from the subclavian arteries. No hemodynamically significant proximal  subclavian artery stenosis. 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 Minimally progressive plaque at the origin of the right vertebral artery without significant stenosis. Vertebral arteries otherwise patent within the neck without stenosis or dissection. Skeleton: No visible acute osseous abnormality. No discrete or worrisome osseous lesions. Other neck: No other acute soft tissue abnormality within the neck. Right submandibular gland either absent or fatty and atrophic in nature. No other mass or adenopathy. Few scattered subcentimeter thyroid nodules measure up to 8 mm, felt to be of doubtful significance given size and patient age. No follow-up imaging recommended (ref: J Am Coll Radiol. 2015 Feb;12(2): 143-50). Upper chest: Visualized upper chest demonstrates no acute finding. Left-sided pacemaker/AICD. 5 mm left upper lobe nodule noted (series 5, image 165), indeterminate. Review of the MIP images confirms the above findings CTA HEAD FINDINGS Anterior circulation: Petrous segments widely patent. Atheromatous change within the carotid siphons with associated mild-to-moderate stenosis, relatively stable. 3 mm focal outpouching at the supraclinoid right ICA stable from previous and felt to be most consistent with a small vascular infundibulum. A1 segments patent bilaterally. Normal anterior communicating artery complex. Anterior cerebral arteries mildly irregular but patent to their distal aspects without stenosis. No M1 stenosis or occlusion. Normal MCA bifurcations. No proximal MCA branch occlusion. Distal MCA branches well perfused. Posterior circulation: Both V4 segments patent to the vertebrobasilar junction without flow-limiting stenosis. Both PICA origins patent and normal. Basilar patent to its distal aspect without stenosis. Superior cerebellar arteries patent bilaterally. Right PCA supplied via the basilar. Left PCA supplied via a hypoplastic left P1 segment  and robust left posterior communicating artery. Short-segment moderate to severe right P2 stenosis again noted, stable (series 7, image 117). PCAs otherwise well perfused to their distal aspects. Venous sinuses: Grossly patent allowing for timing the contrast bolus. Anatomic variants: None significant. Review of the MIP images confirms the above findings IMPRESSION: 1. No emergent large vessel occlusion. 2. Relatively stable CTA as compared to 2020. 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 3. 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 4. Widely patent carotid arteries within the neck. 5.  Aortic Atherosclerosis (ICD10-I70.0). 6. 5 mm left upper lobe nodule, indeterminate. No follow-up needed if patient is low-risk. Non-contrast chest CT can be considered in 12 months if patient is high-risk. This recommendation follows the consensus statement: Guidelines for Management of Incidental Pulmonary Nodules Detected on CT Images: From the Fleischner Society 2017; Radiology 2017; 284:228-243. These results were communicated to Dr. Theda Sers at 3:25 amon 3/6/2022by text page via the [facility] messaging system. Electronically Signed   By: Marland Kitchen  Jeannine Boga M.D.   On: 06/28/2020 03:43   CT ANGIO NECK CODE STROKE  Result Date: 06/28/2020 CLINICAL DATA:  Initial evaluation for acute stroke. EXAM: CT ANGIOGRAPHY HEAD AND NECK TECHNIQUE: Multidetector CT imaging of the head and neck was performed using the standard protocol during bolus administration of intravenous contrast. Multiplanar CT image reconstructions and MIPs were obtained to evaluate the vascular anatomy. Carotid stenosis measurements (when applicable) are obtained utilizing NASCET criteria, using the distal internal carotid diameter as the denominator. CONTRAST:  1109mL OMNIPAQUE IOHEXOL 350 MG/ML SOLN COMPARISON:  Comparison made with prior CT from earlier same  day as well as prior CTA from 05/31/2018. FINDINGS: CTA NECK FINDINGS Aortic arch: Visualized aortic arch of normal caliber with normal 3 vessel branching pattern. Atheromatous change about the arch with no hemodynamically significant stenosis about the origin the great vessels. Right carotid system: Right common and internal carotid arteries patent without stenosis or dissection. Predominantly sub concentric soft plaque about the origin of the right ICA with no more than mild 40% stenosis, stable. Left carotid system: Left common and internal carotid arteries patent without hemodynamically significant stenosis or dissection. Mild calcified plaque about the left carotid bulb without significant stenosis. Vertebral arteries: Both vertebral arteries arise from the subclavian arteries. No hemodynamically significant proximal subclavian artery stenosis. 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 Minimally progressive plaque at the origin of the right vertebral artery without significant stenosis. Vertebral arteries otherwise patent within the neck without stenosis or dissection. Skeleton: No visible acute osseous abnormality. No discrete or worrisome osseous lesions. Other neck: No other acute soft tissue abnormality within the neck. Right submandibular gland either absent or fatty and atrophic in nature. No other mass or adenopathy. Few scattered subcentimeter thyroid nodules measure up to 8 mm, felt to be of doubtful significance given size and patient age. No follow-up imaging recommended (ref: J Am Coll Radiol. 2015 Feb;12(2): 143-50). Upper chest: Visualized upper chest demonstrates no acute finding. Left-sided pacemaker/AICD. 5 mm left upper lobe nodule noted (series 5, image 165), indeterminate. Review of the MIP images confirms the above findings CTA HEAD FINDINGS Anterior circulation: Petrous segments widely patent. Atheromatous change within the carotid siphons with  associated mild-to-moderate stenosis, relatively stable. 3 mm focal outpouching at the supraclinoid right ICA stable from previous and felt to be most consistent with a small vascular infundibulum. A1 segments patent bilaterally. Normal anterior communicating artery complex. Anterior cerebral arteries mildly irregular but patent to their distal aspects without stenosis. No M1 stenosis or occlusion. Normal MCA bifurcations. No proximal MCA branch occlusion. Distal MCA branches well perfused. Posterior circulation: Both V4 segments patent to the vertebrobasilar junction without flow-limiting stenosis. Both PICA origins patent and normal. Basilar patent to its distal aspect without stenosis. Superior cerebellar arteries patent bilaterally. Right PCA supplied via the basilar. Left PCA supplied via a hypoplastic left P1 segment and robust left posterior communicating artery. Short-segment moderate to severe right P2 stenosis again noted, stable (series 7, image 117). PCAs otherwise well perfused to their distal aspects. Venous sinuses: Grossly patent allowing for timing the contrast bolus. Anatomic variants: None significant. Review of the MIP images confirms the above findings IMPRESSION: 1. No emergent large vessel occlusion. 2. Relatively stable CTA as compared to 2020. 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 3. 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 4. Widely patent carotid arteries within  the neck. 5.  Aortic Atherosclerosis (ICD10-I70.0). 6. 5 mm left upper lobe nodule, indeterminate. No follow-up needed if patient is low-risk. Non-contrast chest CT can be considered in 12 months if patient is high-risk. This recommendation follows the consensus statement: Guidelines for Management of Incidental Pulmonary Nodules Detected on CT Images: From the Fleischner Society 2017; Radiology 2017; 284:228-243. These  results were communicated to Dr. Theda Sers at 3:25 amon 3/6/2022by text page via the [facility] messaging system. Electronically Signed   By: Jeannine Boga M.D.   On: 06/28/2020 03:43     Time coordinating discharge: Over 30 minutes    Mary Dee, MD  [facility] 06/29/2020, 3:18 PM

## 2020-06-29 NOTE — Progress Notes (Signed)
Occupational Therapy Treatment Patient Details Name: Mary Mercado MRN: [id number] DOB: 09/03/1940 Today's Date: 06/29/2020    History of present illness This 80 y.o. female presented to ED on 3/6 with Lt UE twitching associated with Rt sided HA.  CT of head showed no acute infarct.  MRI pending.  PMH includes:  h/o Rt parietal CVA, and Rt cerebellar CVA, HTN, A-Fib, BPPV, s/p pacemaker implant.   OT comments  Patient met lying supine in bed in agreement with OT treatment session with focus on self-care re-education and LUE NMR. Patient making progress toward goals completing 2/3 grooming tasks standing at sink level with Min guard and use of RW. Patient attends to L side this date with ability to locate 3/3 grooming items on L side of sink. Patient able to manipulate ADL items with increased time and no external assist. Therapist is very familiar with this patient from previous hospital admission and patient is able to recall several details about this therapist this date. Patient continues to be limited by L hemiparesis, L paresthesia LUE>LLE, and decreased balance and would benefit from continued acute OT services to maximize safety and independence with self-care tasks in prep for safe return home with her husband Mary Mercado.    Follow Up Recommendations  Outpatient OT;Supervision/Assistance - 24 hour    Equipment Recommendations  None recommended by OT    Recommendations for Other Services      Precautions / Restrictions Precautions Precautions: Fall Precaution Comments: Lt inattention Restrictions Weight Bearing Restrictions: No       Mobility Bed Mobility Overal bed mobility: Needs Assistance Bed Mobility: Supine to Sit     Supine to sit: Min guard     General bed mobility comments: Min guard for safety with use of rail. No external assist required. Patient requires increased time.    Transfers Overall transfer level: Needs assistance Equipment used: Rolling  walker (2 wheeled) Transfers: Sit to/from Stand Sit to Stand: Min guard         General transfer comment: Min guard from EOB positioned in lowest setting with cues for hand placement on RW.    Balance Overall balance assessment: Needs assistance Sitting-balance support: Feet unsupported;Single extremity supported Sitting balance-Leahy Scale: Good Sitting balance - Comments: Able to maintain static sitting balance at EOB without external assist.   Standing balance support: Bilateral upper extremity supported Standing balance-Leahy Scale: Fair Standing balance comment: Patient able to maintain static standing at sink level with and without unilateral UE support with Min guard for safety.                           ADL either performed or assessed with clinical judgement   ADL Overall ADL's : Needs assistance/impaired Eating/Feeding: Set up;Sitting   Grooming: Min guard;Standing Grooming Details (indicate cue type and reason): Patient able to complete 2/3 grooming tasks standing at sink level with Min gaurd for safety. Able to take toothbrush out of plastic packaging with increased time 2/2 decreased coordination in LUE.         Upper Body Dressing : Set up;Sitting Upper Body Dressing Details (indicate cue type and reason): Donned posterior hosptial gown seated EOB. Lower Body Dressing: Min guard;Sit to/from stand Lower Body Dressing Details (indicate cue type and reason): Min guard for safety and increased time 2/2 decreased coordination. Toilet Transfer: Designer, television/film set Details (indicate cue type and reason): Simulated with transfer to recliner with use of RW.  Functional mobility during ADLs: Min guard;Rolling walker General ADL Comments: Patient limited by hemiparesis, paresthesia in RUE, and decreased coordination in LUE.     Vision       Perception     Praxis      Cognition Arousal/Alertness: Awake/alert Behavior During Therapy:  WFL for tasks assessed/performed Overall Cognitive Status: Within Functional Limits for tasks assessed                                 General Comments: Patient is well known to this therapist from previous CIR admission. Patient A&Ox4 and able to recall a number of details about this therapist. Patient is aware of current deficits.        Exercises     Shoulder Instructions       General Comments Patient able to recall extensive details about this therapist from previous admission. Patient notes continued paresthesia in LUE and notes in LLE only with fatigue.    Pertinent Vitals/ Pain       Pain Assessment: No/denies pain  Home Living                                          Prior Functioning/Environment              Frequency  Min 2X/week        Progress Toward Goals  OT Goals(current goals can now be found in the care plan section)  Progress towards OT goals: Progressing toward goals  Acute Rehab OT Goals Patient Stated Goal: To get better. OT Goal Formulation: With patient Time For Goal Achievement: 07/12/20 Potential to Achieve Goals: Good ADL Goals Pt Will Perform Grooming: with modified independence;standing Pt Will Perform Upper Body Bathing: with set-up;sitting Pt Will Perform Lower Body Bathing: with set-up;with supervision;sit to/from stand Pt Will Perform Upper Body Dressing: with supervision;standing Pt Will Perform Lower Body Dressing: with supervision;sit to/from stand Pt Will Transfer to Toilet: with supervision;ambulating;regular height toilet;bedside commode;grab bars Pt Will Perform Toileting - Clothing Manipulation and hygiene: with supervision;sit to/from stand Additional ADL Goal #1: Pt will locate all needed ADL items without cues.  Plan Discharge plan remains appropriate;Frequency remains appropriate    Co-evaluation                 AM-PAC OT "6 Clicks" Daily Activity     Outcome Measure    Help from another person eating meals?: A Little Help from another person taking care of personal grooming?: A Little Help from another person toileting, which includes using toliet, bedpan, or urinal?: A Little Help from another person bathing (including washing, rinsing, drying)?: A Little Help from another person to put on and taking off regular upper body clothing?: A Little Help from another person to put on and taking off regular lower body clothing?: A Little 6 Click Score: 18    End of Session Equipment Utilized During Treatment: Gait belt;Rolling walker  OT Visit Diagnosis: Unsteadiness on feet (R26.81);Cognitive communication deficit (R41.841) Symptoms and signs involving cognitive functions: Cerebral infarction   Activity Tolerance Patient tolerated treatment well   Patient Left in chair;with call bell/phone within reach;with chair alarm set   Nurse Communication          Time: 0177-9390 OT Time Calculation (min): 20 min  Charges: OT General Charges $OT Visit: 1 Visit  OT Treatments $Self Care/Home Management : 8-22 mins  Leary Mcnulty H. OTR/L Supplemental OT, Department of rehab services [phone]   Nelsy Madonna R H. 06/29/2020, 9:03 AM

## 2020-06-29 NOTE — Progress Notes (Signed)
Informed of MRI for today.   Device system confirmed to be MRI conditional, with implant date > 6 weeks ago and no evidence of abandoned or epicardial leads in review of most recent CXR Interrogation from today reviewed, pt is currently VP ~72 bpm with "Ab". Pt is s/p AV nodal ablation and has a back up RV lead, she does not have an atrial lead so VOO pacing will be appropriate.   Change device settings for MRI to VOO at 85 bpm  Tachy-therapies to off if applicable.  Program device back to pre-MRI settings after completion of exam.  Mary Mercado  06/29/2020 12:25 PM

## 2020-06-29 NOTE — Progress Notes (Signed)
PROGRESS NOTE    Mary Mercado   IRJ:[id number]  DOB: December 18, 1940  DOA: 06/28/2020     0  PCP: Mary Morale, MD  CC: Left arm twitching, headache  Hospital Course: Mary Mercado is an 80 year old female with PMH seasonal allergies, history of embolic CVA with residual left-sided hemiparesis, hypertension, PAF, history of SVT, glucose intolerance, benign positional vertigo who was brought to the emergency department via EMS due to left arm twitching and associated with right-sided headache that begun when the patient woke up to go to the bathroom and was unable to use her walker normally.   Symptoms were similar to her previous event.   There was concern for seizure activity and she was started on Ativan and Keppra load. She was evaluated by neurology with recommendations for continuing stroke work-up and obtaining EEG. CT head showed no acute stroke.  Chronic right posterior MCA and right cerebellar infarcts were noted. CTA head/neck showed no LVO.  Stable CTA compared to 2020.  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   Interval History:  No events overnight.  Laying in bed in no distress.  Mentation remains intact and no change in weakness nor any further report of seizure-like activity overnight.  Husband was bedside this morning with update given and questions answered. Still awaiting MRI brain.   ROS: Constitutional: negative for chills and fevers, Respiratory: negative for cough, Cardiovascular: negative for chest pain and Gastrointestinal: negative for abdominal pain  Assessment & Plan: * New onset seizure (Mary Mercado) - concern for underlying old CVA contributing - s/p ativan and Keppra in ER - follow up EEG results: "uggestive of cortical dysfunction in right temporal region likely secondary to underlying stroke. No  seizures or epileptiform discharges were seen throughout the recording." - follow up MRI brain: still pending  - SLP eval done; regular diet - follow up further neuro rec's  Hemiplegia and hemiparesis following cerebral infarction affecting left non-dominant side (HCC) -Weakness seems to be at baseline on exam. -Follow-up PT/OT -So far, home health recommended with 24/7 supervision and she states her husband does take care of her at home  Paroxysmal atrial fibrillation (Mary Mercado) -Continue Pradaxa  Prediabetes -Last A1c 5.9% on 01/10/2020 -Continue SSI and CBG monitoring while hospitalized  Hyperlipidemia LDL goal <70 -Continue Lipitor  Essential hypertension - permissive HTN initially - BP remains low/normal without intervention    Old records reviewed in assessment of this patient  Antimicrobials: n/a  DVT prophylaxis:  dabigatran (PRADAXA) capsule 150 mg   Code Status:   Code Status: Full Code Family Communication: Husband bedside  Disposition Plan: Status is: Observation  The patient will require care spanning > 2 midnights and should be moved to inpatient because: Ongoing diagnostic testing needed not appropriate for outpatient work up  Dispo: The patient is from: Home              Anticipated d/c is to: Home              Patient currently is not medically stable to d/c.   Difficult to place patient No  Risk of unplanned readmission score:     Objective: Blood pressure 123/89, pulse 72, temperature 98 F (36.7 C), temperature source Oral, resp. rate 18, height 5\' 6"  (1.676 m), weight 58.3 kg, SpO2 99 %.  Examination: General appearance: alert, cooperative and no distress Head: Normocephalic,  without obvious abnormality, atraumatic Eyes: EOMI, PERRL Lungs: clear to auscultation bilaterally Heart: regular rate and rhythm and S1, S2 normal Abdomen: normal findings: bowel sounds normal and soft, non-tender Extremities: no edema Skin: mobility and turgor  normal Neurologic: 4+/5 LUE strength; sensation intact throughout; mild dysmetria appreciated this morning with left hand finger to nose   Consultants:   Neurology  Procedures:   EEG, 06/28/20  Data Reviewed: I have personally reviewed following labs and imaging studies Results for orders placed or performed during the hospital encounter of 06/28/20 (from the past 24 hour(s))  CBG monitoring, ED     Status: Abnormal   Collection Time: 06/28/20  1:02 PM  Result Value Ref Range   Glucose-Capillary 105 (H) 70 - 99 mg/dL  Glucose, capillary     Status: None   Collection Time: 06/28/20  6:37 PM  Result Value Ref Range   Glucose-Capillary 92 70 - 99 mg/dL  Glucose, capillary     Status: Abnormal   Collection Time: 06/28/20  9:05 PM  Result Value Ref Range   Glucose-Capillary 190 (H) 70 - 99 mg/dL  Lipid panel     Status: None   Collection Time: 06/29/20  3:23 AM  Result Value Ref Range   Cholesterol 135 0 - 200 mg/dL   Triglycerides 50 <150 mg/dL   HDL 54 >40 mg/dL   Total CHOL/HDL Ratio 2.5 RATIO   VLDL 10 0 - 40 mg/dL   LDL Cholesterol 71 0 - 99 mg/dL  Basic metabolic panel     Status: Abnormal   Collection Time: 06/29/20  3:23 AM  Result Value Ref Range   Sodium 136 135 - 145 mmol/L   Potassium 3.5 3.5 - 5.1 mmol/L   Chloride 103 98 - 111 mmol/L   CO2 23 22 - 32 mmol/L   Glucose, Bld 93 70 - 99 mg/dL   BUN 11 8 - 23 mg/dL   Creatinine, Ser 0.63 0.44 - 1.00 mg/dL   Calcium 8.5 (L) 8.9 - 10.3 mg/dL   GFR, Estimated >60 >60 mL/min   Anion gap 10 5 - 15  CBC with Differential/Platelet     Status: None   Collection Time: 06/29/20  3:23 AM  Result Value Ref Range   WBC 5.0 4.0 - 10.5 K/uL   RBC 4.57 3.87 - 5.11 MIL/uL   Hemoglobin 13.4 12.0 - 15.0 g/dL   HCT 41.1 36.0 - 46.0 %   MCV 89.9 80.0 - 100.0 fL   MCH 29.3 26.0 - 34.0 pg   MCHC 32.6 30.0 - 36.0 g/dL   RDW 12.9 11.5 - 15.5 %   Platelets 195 150 - 400 K/uL   nRBC 0.0 0.0 - 0.2 %   Neutrophils Relative % 55 %    Neutro Abs 2.8 1.7 - 7.7 K/uL   Lymphocytes Relative 31 %   Lymphs Abs 1.6 0.7 - 4.0 K/uL   Monocytes Relative 11 %   Monocytes Absolute 0.5 0.1 - 1.0 K/uL   Eosinophils Relative 2 %   Eosinophils Absolute 0.1 0.0 - 0.5 K/uL   Basophils Relative 1 %   Basophils Absolute 0.1 0.0 - 0.1 K/uL   Immature Granulocytes 0 %   Abs Immature Granulocytes 0.01 0.00 - 0.07 K/uL  Magnesium     Status: None   Collection Time: 06/29/20  3:23 AM  Result Value Ref Range   Magnesium 1.8 1.7 - 2.4 mg/dL  Glucose, capillary     Status: Abnormal   Collection Time:  06/29/20  5:52 AM  Result Value Ref Range   Glucose-Capillary 101 (H) 70 - 99 mg/dL  Glucose, capillary     Status: Abnormal   Collection Time: 06/29/20 11:26 AM  Result Value Ref Range   Glucose-Capillary 107 (H) 70 - 99 mg/dL   Comment 1 Notify RN     Recent Results (from the past 240 hour(s))  Resp Panel by RT-PCR (Flu A&B, Covid) Nasopharyngeal Swab     Status: None   Collection Time: 07/21/20  3:49 AM   Specimen: Nasopharyngeal Swab; Nasopharyngeal(NP) swabs in vial transport medium  Result Value Ref Range Status   SARS Coronavirus 2 by RT PCR NEGATIVE NEGATIVE Final    Comment: (NOTE) SARS-CoV-2 target nucleic acids are NOT DETECTED.  The SARS-CoV-2 RNA is generally detectable in upper respiratory specimens during the acute phase of infection. The lowest concentration of SARS-CoV-2 viral copies this assay can detect is 138 copies/mL. A negative result does not preclude SARS-Cov-2 infection and should not be used as the sole basis for treatment or other patient management decisions. A negative result may occur with  improper specimen collection/handling, submission of specimen other than nasopharyngeal swab, presence of viral mutation(s) within the areas targeted by this assay, and inadequate number of viral copies(<138 copies/mL). A negative result must be combined with clinical observations, patient history, and  epidemiological information. The expected result is Negative.  Fact Sheet for Patients:  EntrepreneurPulse.com.au  Fact Sheet for Healthcare Providers:  IncredibleEmployment.be  This test is no t yet approved or cleared by the [place] FDA and  has been authorized for detection and/or diagnosis of SARS-CoV-2 by FDA under an Emergency Use Authorization (EUA). This EUA will remain  in effect (meaning this test can be used) for the duration of the COVID-19 declaration under Section 564(b)(1) of the Act, 21 U.S.C.section 360bbb-3(b)(1), unless the authorization is terminated  or revoked sooner.       Influenza A by PCR NEGATIVE NEGATIVE Final   Influenza B by PCR NEGATIVE NEGATIVE Final    Comment: (NOTE) The Xpert Xpress SARS-CoV-2/FLU/RSV plus assay is intended as an aid in the diagnosis of influenza from Nasopharyngeal swab specimens and should not be used as a sole basis for treatment. Nasal washings and aspirates are unacceptable for Xpert Xpress SARS-CoV-2/FLU/RSV testing.  Fact Sheet for Patients: EntrepreneurPulse.com.au  Fact Sheet for Healthcare Providers: IncredibleEmployment.be  This test is not yet approved or cleared by the [place] FDA and has been authorized for detection and/or diagnosis of SARS-CoV-2 by FDA under an Emergency Use Authorization (EUA). This EUA will remain in effect (meaning this test can be used) for the duration of the COVID-19 declaration under Section 564(b)(1) of the Act, 21 U.S.C. section 360bbb-3(b)(1), unless the authorization is terminated or revoked.  Performed at [facility], [place] [address] [place], [place] [address]      Radiology Studies: EEG adult  Result Date: 07/21/20 Lora Havens, MD     July 21, 2020 12:14 PM Patient Name: Deveney Bayon MRN: [id number] Epilepsy Attending: Lora Havens Referring Physician/Provider: Dr  Tennis Must Date: Jul 21, 2020 Duration: 25.32 mins Patient history: 80 y.o. female right handed PMHx as above and stroke with residual deficits with normal BP PTA arrives with left upper extremity twitching and left sided weakness. EEG to evaluate for seizure Level of alertness: Awake, asleep AEDs during EEG study: Ativan, LEV Technical aspects: This EEG study was done with scalp electrodes positioned according to the 10-20 International system of  electrode placement. Electrical activity was acquired at a sampling rate of 500Hz  and reviewed with a high frequency filter of 70Hz  and a low frequency filter of 1Hz . EEG data were recorded continuously and digitally stored. Description: No clear posterior dominant rhythm was seen. Sleep was characterized by vertex waves, sleep spindles (12 to 14 Hz), maximal frontocentral region.  There is an excessive amount of 15 to 18 Hz beta activity with irregular morphology distributed symmetrically and diffusely. EEG also showed continuous 3 to 6 Hz theta-delta slowing in right temporal region which at times appears rhythmic. Hyperventilation and photic stimulation were not performed.   ABNORMALITY -Continuous slow, right temporal region IMPRESSION: This study is suggestive of cortical dysfunction in right temporal region likely secondary to underlying stroke. No seizures or epileptiform discharges were seen throughout the recording. Lora Havens   CT HEAD CODE STROKE WO CONTRAST  Result Date: 06/28/2020 CLINICAL DATA:  Code stroke. Initial evaluation for acute left-sided weakness. EXAM: CT HEAD WITHOUT CONTRAST TECHNIQUE: Contiguous axial images were obtained from the base of the skull through the vertex without intravenous contrast. COMPARISON:  Previous study from 01/09/2020. FINDINGS: Brain: Generalized age-related cerebral atrophy with moderate chronic microvascular ischemic disease. Remote posterior right MCA distribution infarct. Additional chronic right cerebellar  infarct. No acute intracranial hemorrhage. No acute large vessel territory infarct. No mass lesion, midline shift or mass effect. No hydrocephalus or extra-axial fluid collection. Vascular: No visible hyperdense vessel. Calcified atherosclerosis at the skull base. Skull: Scalp soft tissues and calvarium within normal limits. Sinuses/Orbits: Globes and orbital soft tissues demonstrate no acute finding. Left gaze noted. Prior ocular lens replacement on the left. Paranasal sinuses are clear. No mastoid effusion. Other: None. ASPECTS [facility] Stroke Program Early CT Score) - Ganglionic level infarction (caudate, lentiform nuclei, internal capsule, insula, M1-M3 cortex): 7 - Supraganglionic infarction (M4-M6 cortex): 3 Total score (0-10 with 10 being normal): 10 IMPRESSION: 1. No acute intracranial infarct or other abnormality. 2. ASPECTS is 10. 3. 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 4. Underlying atrophy with chronic small vessel ischemic disease. These results were communicated to Dr. Theda Sers at 3:05 amon 3/6/2022by text page via the [facility] messaging system. Electronically Signed   By: Jeannine Boga M.D.   On: 06/28/2020 03:07   CT ANGIO HEAD CODE STROKE  Result Date: 06/28/2020 CLINICAL DATA:  Initial evaluation for acute stroke. EXAM: CT ANGIOGRAPHY HEAD AND NECK TECHNIQUE: Multidetector CT imaging of the head and neck was performed using the standard protocol during bolus administration of intravenous contrast. Multiplanar CT image reconstructions and MIPs were obtained to evaluate the vascular anatomy. Carotid stenosis measurements (when applicable) are obtained utilizing NASCET criteria, using the distal internal carotid diameter as the denominator. CONTRAST:  181mL OMNIPAQUE IOHEXOL 350 MG/ML SOLN COMPARISON:  Comparison made with prior CT from earlier same day as well as prior CTA from 05/31/2018. FINDINGS: CTA NECK FINDINGS Aortic arch: Visualized aortic arch of normal caliber  with normal 3 vessel branching pattern. Atheromatous change about the arch with no hemodynamically significant stenosis about the origin the great vessels. Right carotid system: Right common and internal carotid arteries patent without stenosis or dissection. Predominantly sub concentric soft plaque about the origin of the right ICA with no more than mild 40% stenosis, stable. Left carotid system: Left common and internal carotid arteries patent without hemodynamically significant stenosis or dissection. Mild calcified plaque about the left carotid bulb without significant stenosis. Vertebral arteries: Both vertebral arteries arise from the subclavian arteries. No  hemodynamically significant proximal subclavian artery stenosis. 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 Minimally progressive plaque at the origin of the right vertebral artery without significant stenosis. Vertebral arteries otherwise patent within the neck without stenosis or dissection. Skeleton: No visible acute osseous abnormality. No discrete or worrisome osseous lesions. Other neck: No other acute soft tissue abnormality within the neck. Right submandibular gland either absent or fatty and atrophic in nature. No other mass or adenopathy. Few scattered subcentimeter thyroid nodules measure up to 8 mm, felt to be of doubtful significance given size and patient age. No follow-up imaging recommended (ref: J Am Coll Radiol. 2015 Feb;12(2): 143-50). Upper chest: Visualized upper chest demonstrates no acute finding. Left-sided pacemaker/AICD. 5 mm left upper lobe nodule noted (series 5, image 165), indeterminate. Review of the MIP images confirms the above findings CTA HEAD FINDINGS Anterior circulation: Petrous segments widely patent. Atheromatous change within the carotid siphons with associated mild-to-moderate stenosis, relatively stable. 3 mm focal outpouching at the supraclinoid right ICA stable from  previous and felt to be most consistent with a small vascular infundibulum. A1 segments patent bilaterally. Normal anterior communicating artery complex. Anterior cerebral arteries mildly irregular but patent to their distal aspects without stenosis. No M1 stenosis or occlusion. Normal MCA bifurcations. No proximal MCA branch occlusion. Distal MCA branches well perfused. Posterior circulation: Both V4 segments patent to the vertebrobasilar junction without flow-limiting stenosis. Both PICA origins patent and normal. Basilar patent to its distal aspect without stenosis. Superior cerebellar arteries patent bilaterally. Right PCA supplied via the basilar. Left PCA supplied via a hypoplastic left P1 segment and robust left posterior communicating artery. Short-segment moderate to severe right P2 stenosis again noted, stable (series 7, image 117). PCAs otherwise well perfused to their distal aspects. Venous sinuses: Grossly patent allowing for timing the contrast bolus. Anatomic variants: None significant. Review of the MIP images confirms the above findings IMPRESSION: 1. No emergent large vessel occlusion. 2. Relatively stable CTA as compared to 2020. 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 3. 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 4. Widely patent carotid arteries within the neck. 5.  Aortic Atherosclerosis (ICD10-I70.0). 6. 5 mm left upper lobe nodule, indeterminate. No follow-up needed if patient is low-risk. Non-contrast chest CT can be considered in 12 months if patient is high-risk. This recommendation follows the consensus statement: Guidelines for Management of Incidental Pulmonary Nodules Detected on CT Images: From the Fleischner Society 2017; Radiology 2017; 284:228-243. These results were communicated to Dr. Theda Sers at 3:25 amon 3/6/2022by text page via the [facility] messaging system. Electronically  Signed   By: Jeannine Boga M.D.   On: 06/28/2020 03:43   CT ANGIO NECK CODE STROKE  Result Date: 06/28/2020 CLINICAL DATA:  Initial evaluation for acute stroke. EXAM: CT ANGIOGRAPHY HEAD AND NECK TECHNIQUE: Multidetector CT imaging of the head and neck was performed using the standard protocol during bolus administration of intravenous contrast. Multiplanar CT image reconstructions and MIPs were obtained to evaluate the vascular anatomy. Carotid stenosis measurements (when applicable) are obtained utilizing NASCET criteria, using the distal internal carotid diameter as the denominator. CONTRAST:  171mL OMNIPAQUE IOHEXOL 350 MG/ML SOLN COMPARISON:  Comparison made with prior CT from earlier same day as well as prior CTA from 05/31/2018. FINDINGS: CTA NECK FINDINGS Aortic arch: Visualized aortic arch of normal caliber with normal 3 vessel branching pattern. Atheromatous change about  the arch with no hemodynamically significant stenosis about the origin the great vessels. Right carotid system: Right common and internal carotid arteries patent without stenosis or dissection. Predominantly sub concentric soft plaque about the origin of the right ICA with no more than mild 40% stenosis, stable. Left carotid system: Left common and internal carotid arteries patent without hemodynamically significant stenosis or dissection. Mild calcified plaque about the left carotid bulb without significant stenosis. Vertebral arteries: Both vertebral arteries arise from the subclavian arteries. No hemodynamically significant proximal subclavian artery stenosis. 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 Minimally progressive plaque at the origin of the right vertebral artery without significant stenosis. Vertebral arteries otherwise patent within the neck without stenosis or dissection. Skeleton: No visible acute osseous abnormality. No discrete or worrisome osseous lesions. Other  neck: No other acute soft tissue abnormality within the neck. Right submandibular gland either absent or fatty and atrophic in nature. No other mass or adenopathy. Few scattered subcentimeter thyroid nodules measure up to 8 mm, felt to be of doubtful significance given size and patient age. No follow-up imaging recommended (ref: J Am Coll Radiol. 2015 Feb;12(2): 143-50). Upper chest: Visualized upper chest demonstrates no acute finding. Left-sided pacemaker/AICD. 5 mm left upper lobe nodule noted (series 5, image 165), indeterminate. Review of the MIP images confirms the above findings CTA HEAD FINDINGS Anterior circulation: Petrous segments widely patent. Atheromatous change within the carotid siphons with associated mild-to-moderate stenosis, relatively stable. 3 mm focal outpouching at the supraclinoid right ICA stable from previous and felt to be most consistent with a small vascular infundibulum. A1 segments patent bilaterally. Normal anterior communicating artery complex. Anterior cerebral arteries mildly irregular but patent to their distal aspects without stenosis. No M1 stenosis or occlusion. Normal MCA bifurcations. No proximal MCA branch occlusion. Distal MCA branches well perfused. Posterior circulation: Both V4 segments patent to the vertebrobasilar junction without flow-limiting stenosis. Both PICA origins patent and normal. Basilar patent to its distal aspect without stenosis. Superior cerebellar arteries patent bilaterally. Right PCA supplied via the basilar. Left PCA supplied via a hypoplastic left P1 segment and robust left posterior communicating artery. Short-segment moderate to severe right P2 stenosis again noted, stable (series 7, image 117). PCAs otherwise well perfused to their distal aspects. Venous sinuses: Grossly patent allowing for timing the contrast bolus. Anatomic variants: None significant. Review of the MIP images confirms the above findings IMPRESSION: 1. No emergent large vessel  occlusion. 2. Relatively stable CTA as compared to 2020. 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 3. 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 4. Widely patent carotid arteries within the neck. 5.  Aortic Atherosclerosis (ICD10-I70.0). 6. 5 mm left upper lobe nodule, indeterminate. No follow-up needed if patient is low-risk. Non-contrast chest CT can be considered in 12 months if patient is high-risk. This recommendation follows the consensus statement: Guidelines for Management of Incidental Pulmonary Nodules Detected on CT Images: From the Fleischner Society 2017; Radiology 2017; 284:228-243. These results were communicated to Dr. Theda Sers at 3:25 amon 3/6/2022by text page via the [facility] messaging system. Electronically Signed   By: Jeannine Boga M.D.   On: 06/28/2020 03:43   CT ANGIO HEAD CODE STROKE  Final Result    CT ANGIO NECK CODE STROKE  Final Result    CT HEAD CODE STROKE WO CONTRAST  Final Result    MR BRAIN WO CONTRAST    (  Results Pending)    Scheduled Meds: . atorvastatin  80 mg Oral q1800  . dabigatran  150 mg Oral BID  . insulin aspart  0-6 Units Subcutaneous TID WC  . levETIRAcetam  500 mg Oral BID  . sodium chloride flush  3 mL Intravenous Once   PRN Meds: ondansetron **OR** ondansetron (ZOFRAN) IV Continuous Infusions:   LOS: 0 days  Time spent: Greater than 50% of the 35 minute visit was spent in counseling/coordination of care for the patient as laid out in the A&P.   [name], MD [facility] 06/29/2020, 12:21 PM

## 2020-06-29 NOTE — Progress Notes (Addendum)
STROKE TEAM PROGRESS NOTE   INTERVAL HISTORY Seen and examined AAOx3.  No complaints.  Vitals:   06/28/20 2004 06/28/20 2348 06/29/20 0408 06/29/20 1337  BP: 140/82 122/78 123/89 116/71  Pulse: 70 81 72 70  Resp: 20 19 18 16   Temp: 97.9 F (36.6 C) 98.8 F (37.1 C) 98 F (36.7 C) 98.3 F (36.8 C)  TempSrc: Oral Oral Oral Oral  SpO2: 97% 96% 99% 95%  Weight:      Height:       CBC:  Recent Labs  Lab 06/28/20 0244 06/28/20 0254 06/29/20 0323  WBC 6.1  --  5.0  NEUTROABS 3.2  --  2.8  HGB 14.6 16.0* 13.4  HCT 45.8 47.0* 41.1  MCV 92.7  --  89.9  PLT 209  --  623   Basic Metabolic Panel:  Recent Labs  Lab 06/28/20 0244 06/28/20 0254 06/29/20 0323  NA 138 139 136  K 4.0 4.1 3.5  CL 100 103 103  CO2 25  --  23  GLUCOSE 155* 148* 93  BUN 13 18 11   CREATININE 0.70 0.70 0.63  CALCIUM 9.2  --  8.5*  MG  --   --  1.8   Lipid Panel:  Recent Labs  Lab 06/29/20 0323  CHOL 135  TRIG 50  HDL 54  CHOLHDL 2.5  VLDL 10  LDLCALC 71    HgbA1c:  Recent Labs  Lab 06/28/20 0244  HGBA1C 6.4*   Urine Drug Screen: No results for input(s): LABOPIA, COCAINSCRNUR, LABBENZ, AMPHETMU, THCU, LABBARB in the last 168 hours.  Alcohol Level No results for input(s): ETH in the last 168 hours.  IMAGING past 24 hours MR BRAIN WO CONTRAST  Result Date: 06/29/2020 CLINICAL DATA:  Neuro deficit, acute, stroke suspected. EXAM: MRI HEAD WITHOUT CONTRAST TECHNIQUE: Multiplanar, multiecho pulse sequences of the brain and surrounding structures were obtained without intravenous contrast. COMPARISON:  Head CT June 28, 2020 FINDINGS: Brain: No acute infarction, hemorrhage, hydrocephalus, extra-axial collection or mass lesion. 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 Area of encephalomalacia and gliosis in the right parietal lobe. Scattered and confluent foci of T2 hyperintensity are seen white matter of the cerebral hemispheres and within the pons, nonspecific, most  likely related to chronic microvascular ischemic changes. Focus of susceptibility artifact in the right cerebellar hemisphere, right basal ganglia and insula, suggesting hemosiderin deposits from prior microhemorrhage Vascular: Normal flow voids. Skull and upper cervical spine: Normal marrow signal. Sinuses/Orbits: Left lens surgery.  Paranasal sinuses are clear Other: None. IMPRESSION: 1. No acute intracranial abnormality. 2. 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 3. Encephalomalacia and gliosis in the right parietal lobe. 4. Moderate chronic microvascular ischemic changes. Electronically Signed   By: Pedro Earls M.D.   On: 06/29/2020 13:18   ECHOCARDIOGRAM COMPLETE  Result Date: 06/29/2020    ECHOCARDIOGRAM REPORT   Patient Name:   Mary Mercado Date of Exam: 06/29/2020 Medical Rec #:  [id number]                 Height:       66.0 in Accession #:    [id number]                Weight:       128.5 lb Date of Birth:  [birth year]                 BSA:          1.657 m  Patient Age:    75 years                  BP:           123/89 mmHg Patient Gender: F                         HR:           77 bpm. Exam Location:  Inpatient Procedure: 2D Echo, Cardiac Doppler and Color Doppler Indications:    Stroke I63.9  History:        Patient has prior history of Echocardiogram examinations, most                 recent 01/10/2020. Pacemaker, Stroke; Risk Factors:Hypertension,                 Dyslipidemia and Non-Smoker.  Sonographer:    Vickie Epley RDCS Referring Phys: [id number] [place] XU IMPRESSIONS  1. Left ventricular ejection fraction, by estimation, is 60 to 65%. The left ventricle has normal function. The left ventricle has no regional wall motion abnormalities. Left ventricular diastolic parameters are indeterminate.  2. Right ventricular systolic function is normal. The right ventricular size is mildly enlarged. There is normal pulmonary artery systolic pressure. The  estimated right ventricular systolic pressure is 21.1 mmHg.  3. Left atrial size was severely dilated.  4. Right atrial size was severely dilated.  5. The mitral valve is degenerative. Trivial mitral valve regurgitation.  6. 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  7. The aortic valve is tricuspid. Aortic valve regurgitation is trivial. No aortic stenosis is present.  8. The inferior vena cava is normal in size with <50% respiratory variability, suggesting right atrial pressure of 8 mmHg. Comparison(s): No significant change from prior study. FINDINGS  Left Ventricle: Left ventricular ejection fraction, by estimation, is 60 to 65%. The left ventricle has normal function. The left ventricle has no regional wall motion abnormalities. The left ventricular internal cavity size was normal in size. There is  no left ventricular hypertrophy. Left ventricular diastolic parameters are indeterminate. Right Ventricle: The right ventricular size is mildly enlarged. No increase in right ventricular wall thickness. Right ventricular systolic function is normal. There is normal pulmonary artery systolic pressure. The tricuspid regurgitant velocity is 2.26  m/s, and with an assumed right atrial pressure of 8 mmHg, the estimated right ventricular systolic pressure is 94.1 mmHg. Left Atrium: Left atrial size was severely dilated. Right Atrium: Right atrial size was severely dilated. Pericardium: There is no evidence of pericardial effusion. Mitral Valve: The mitral valve is degenerative in appearance. There is mild thickening of the mitral valve leaflet(s). There is mild calcification of the mitral valve leaflet(s). Mild mitral annular calcification. Trivial mitral valve regurgitation. Tricuspid Valve: The tricuspid valve is normal in structure. There is tethering of the tricuspid valve leaflet by the pacemaker wire with resultant  moderate-to-severe tricuspid regurgitation. Aortic Valve: The aortic valve is tricuspid. Aortic valve regurgitation is trivial. No aortic stenosis is present. Pulmonic Valve: The pulmonic valve was normal in structure. Pulmonic valve regurgitation is trivial. Aorta: The aortic root is normal in size and structure. Venous: The inferior vena cava is normal in size with less than 50% respiratory variability, suggesting right atrial pressure of 8 mmHg. IAS/Shunts: No atrial level shunt detected by color flow Doppler.  LEFT VENTRICLE PLAX 2D LVIDd:  4.20 cm LVIDs:         2.90 cm LV PW:         0.60 cm LV IVS:        0.60 cm LVOT diam:     2.10 cm LV SV:         40 LV SV Index:   24 LVOT Area:     3.46 cm  LV Volumes (MOD) LV vol d, MOD A2C: 63.3 ml LV vol d, MOD A4C: 53.4 ml LV vol s, MOD A2C: 26.2 ml LV vol s, MOD A4C: 26.1 ml LV SV MOD A2C:     37.1 ml LV SV MOD A4C:     53.4 ml LV SV MOD BP:      34.3 ml RIGHT VENTRICLE RV S prime:     10.00 cm/s TAPSE (M-mode): 1.7 cm LEFT ATRIUM           Index       RIGHT ATRIUM           Index LA diam:      3.90 cm 2.35 cm/m  RA Area:     27.90 cm LA Vol (A2C): 50.1 ml 30.23 ml/m RA Volume:   93.40 ml  56.36 ml/m LA Vol (A4C): 63.9 ml 38.56 ml/m  AORTIC VALVE LVOT Vmax:   69.00 cm/s LVOT Vmean:  44.800 cm/s LVOT VTI:    0.116 m  AORTA Ao Root diam: 3.30 cm TRICUSPID VALVE TR Peak grad:   20.4 mmHg TR Vmax:        226.00 cm/s  SHUNTS Systemic VTI:  0.12 m Systemic Diam: 2.10 cm Gwyndolyn Kaufman MD Electronically signed by Gwyndolyn Kaufman MD Signature Date/Time: 06/29/2020/2:01:29 PM    Final     PHYSICAL EXAM Constitutional: Appears well-developed and well-nourished.  Psych: Affect appropriate to situation Eyes: No scleral injection HENT: No OP obstruction. Head: Normocephalic.  Cardiovascular: Normal rate and regular rhythm.  Respiratory: Effort normal, symmetric excursions bilaterally, no audible wheezing. GI: Soft.  No distension. There is no  tenderness.  Skin: WDI  Neuro: Mental Status: Awake alert oriented x3. Mildly reduced attention concentration Mildly dysarthric speech No evidence of aphasia Cranial nerves: Pupils equal round react light, extraocular intact, visual fields full, face is symmetric, facial sensation intact, tongue and palate midline. Motor exam: Increased tone on the left with normal bulk and 4/5 in the left upper and lower extremity.  Right side full-strength. Sensation intact light touch all over DTR: Upgoing toe on the left. Gait testing deferred at this time   ASSESSMENT/PLAN [name] is a 80 y.o. female with history of stroke with residual deficits at baseline. On 3/5 she presented with left upper extremity twitching and left sided weakness. Patient stated symptoms are similar to the symptoms she experienced with previous stroke. Twitching largely resolved with lorazepam except for left thumb twitching and the patient was loaded with LEV.  EEG negative stroke but shows right hemispheric cortical slowing.  She currently is on dagibatran after having a stroke on apixaban.  Focal seizure  Pt stated that she tried to hold onto her walker and started to have LUE jerking movement and she was not able to control, she denies weakness  EEG no seizures, cortical slowing on right as expected d/t old stroke.   Started on Keppra and I would recommend continuing Keppra.  Stroke to be ruled out  Code Stroke CT: old strokes in [facility] and R cerebellum.   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 No LVO.   MRI no stroke  2D Echo-see above  LDL 71-Continue home statin  HgbA1c 6.4  VTE prophylaxis - SCDs  Pradaxa prior to admission, now on Pradaxa 150 twice daily  Therapy recommendations:  pending  Disposition:  pending  History of stroke  05/2018 admitted for left-sided numbness weakness, status post TPA, MRI showed  right frontal parietal infarct.  CTA head neck showed right P2, left VA, bilateral ICA siphon severe stenosis.  EF 60 to 65%.  Negative for DVT, TEE negative.  LDL 117, A1c 5.9.  Discharged with DAPT and Lipitor 40  30-day CardioNet monitoring showed A. fib patient started on Eliquis.  12/2019  Admitted for right cerebellar infarct.  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  EF 60 to 65%, LDL 81, A1c 5.9.  Eliquis changed to Pradaxa, continue Lipitor 80.  Hypertension  Home meds:  Cozaar, Lopressor  Stable . Long-term BP goal normotensive  Hyperlipidemia  Home meds: Lipitor 80mg ; resumed in hospital  LDL 71, goal < 70  Continue statin at discharge  Other Stroke Risk Factors  Advanced Age >/= 51   OSA  Other Active Problems  BPPV  Follow-up with Dr. Leonie Man in 4 to 6 weeks.  Plan discussed with Dr. Sabino Gasser over secure chat.  Hospital day # 0  SEIZURE PRECAUTIONS Per [facility] statutes, patients with seizures are not allowed to drive until they have been seizure-free for six months.   Use caution when using heavy equipment or power tools. Avoid working on ladders or at heights. Take showers instead of baths. Ensure the water temperature is not too high on the home water heater. Do not go swimming alone. Do not lock yourself in a room alone (i.e. bathroom). When caring for infants or small children, sit down when holding, feeding, or changing them to minimize risk of injury to the child in the event you have a seizure. Maintain good sleep hygiene. Avoid alcohol.   If patient has another seizure, call 911 and bring them back to the ED if: A. The seizure lasts longer than 5 minutes.  B. The patient doesn't wake shortly after the seizure or has new problems such as difficulty seeing, speaking or moving following the seizure C. The patient was injured during the seizure D. The patient has a temperature over 102 F  (39C) E. The patient vomited during the seizure and now is having trouble breathing   -- Amie Portland, MD Stroke Neurology Pager: [phone]       To contact Stroke Continuity provider, please refer to [email or web address] After hours, contact General Neurology

## 2020-06-29 NOTE — Progress Notes (Signed)
  Echocardiogram 2D Echocardiogram has been performed.  Michiel Cowboy 06/29/2020, 11:33 AM

## 2020-06-29 NOTE — Discharge Instructions (Signed)
Information on my medicine - Pradaxa (dabigatran)  This medication education was reviewed with me or my healthcare representative as part of my discharge preparation.  The pharmacist that spoke with me during my hospital stay was:  Vaudine Dutan, RPH-CPP  Why was Pradaxa prescribed for you? Pradaxa was prescribed for you to reduce the risk of forming blood clots that cause a stroke if you have a medical condition called atrial fibrillation (a type of irregular heartbeat).    What do you Need to know about PradAXa? Take your Pradaxa TWICE DAILY - one capsule in the morning and one tablet in the evening with or without food.  It would be best to take the doses about the same time each day.  The capsules should not be broken, chewed or opened - they must be swallowed whole.  Do not store Pradaxa in other medication containers - once the bottle is opened the Pradaxa should be used within FOUR months; throw away any capsules that haven't been by that time.  Take Pradaxa exactly as prescribed by your doctor.  DO NOT stop taking Pradaxa without talking to the doctor who prescribed the medication.  Stopping without other stroke prevention medication to take the place of Pradaxa may increase your risk of developing a clot that causes a stroke.  Refill your prescription before you run out.  After discharge, you should have regular check-up appointments with your healthcare provider that is prescribing your Pradaxa.  In the future your dose may need to be changed if your kidney function or weight changes by a significant amount.  What do you do if you miss a dose? If you miss a dose, take it as soon as you remember on the same day.  If your next dose is less than 6 hours away, skip the missed dose.  Do not take two doses of PRADAXA at the same time.  Important Safety Information A possible side effect of Pradaxa is bleeding. You should call your healthcare provider right away if you experience any of  the following: ? Bleeding from an injury or your nose that does not stop. ? Unusual colored urine (red or dark brown) or unusual colored stools (red or black). ? Unusual bruising for unknown reasons. ? A serious fall or if you hit your head (even if there is no bleeding).  Some medicines may interact with Pradaxa and might increase your risk of bleeding or clotting while on Pradaxa. To help avoid this, consult your healthcare provider or pharmacist prior to using any new prescription or non-prescription medications, including herbals, vitamins, non-steroidal anti-inflammatory drugs (NSAIDs) and supplements.  This website has more information on Pradaxa (dabigatran): [email or web address].[email or web address]     

## 2020-06-29 NOTE — Progress Notes (Signed)
Per order, changed device settings for MRI to VOO at 85 bpm   Tachy-therapies to off if applicable.   Will program device back to pre-MRI settings after completion of exam.

## 2020-07-01 ENCOUNTER — Telehealth: Payer: Self-pay

## 2020-07-01 NOTE — Telephone Encounter (Signed)
I called pt. I advised pt that Dr. Rexene Alberts reviewed their sleep study results and found that the pt should start ASV for treatment I reviewed PAP compliance expectations with the pt. Pt is agreeable to starting an ASV. I advised pt that an order will be sent to a DME, [facility], and [facility] will call the pt within about one week after they file with the pt's insurance. [facility] will show the pt how to use the machine, fit for masks, and troubleshoot the ASV if needed. I will call the pt in 4 weeks to see if she  A letter with all of this information in it will be mailed to the pt as a reminder. I verified with the pt that the address we have on file is correct. Pt verbalized understanding of results. Pt had no questions at this time but was encouraged to call back if questions arise. I have sent the order to [facility] and have received confirmation that they have received the order.

## 2020-07-01 NOTE — Addendum Note (Signed)
Addended by: Star Age on: 07/01/2020 07:44 AM   Modules accepted: Orders

## 2020-07-01 NOTE — Procedures (Signed)
PATIENT'S NAME:  Mary Mercado, Mary Mercado DOB:      03/12/2021      MR#:    [id number]     DATE OF RECORDING: 06/22/2020 REFERRING M.D.:  --- Study Performed:   CPAP  Titration HISTORY: 80 year old woman with a history of right MCA stroke in 2020, recent right cerebellar stroke in September 2021, vertigo, palpitations, hypertension, hyperlipidemia, allergies, and A. fib, with status post cardioversion twice, who presents for a full night titration study to treat her severe sleep apnea.  Her baseline sleep study from 03/12/2020 showed severe obstructive sleep apnea, with a total AHI of 44.6/hour, REM AHI of 52.8/hour, supine AHI of 44.6/hour and O2 nadir of 86%.  She did have several central apneas at the time. The patient endorsed the Epworth Sleepiness Scale at 7 points. The patient's weight 137 pounds with a height of 65 (inches), resulting in a BMI of 22.8 kg/m2. The patient's neck circumference measured 13 inches.  CURRENT MEDICATIONS: Lipitor, Cardizem CD, Klor-Con, Lopressor, Pradaxa, Lexiscan, TC-Myoview  PROCEDURE:  This is a multichannel digital polysomnogram utilizing the SomnoStar 11.2 system.  Electrodes and sensors were applied and monitored per AASM Specifications.   EEG, EOG, Chin and Limb EMG, were sampled at 200 Hz.  ECG, Snore and Nasal Pressure, Thermal Airflow, Respiratory Effort, CPAP Flow and Pressure, Oximetry was sampled at 50 Hz. Digital video and audio were recorded.      The patient was tried on nasal pillows but due to oral venting she was switched to a full facemask and tried a medium and small F 30 I fullface mask from [employer] but due to higher leak she was switched to a medium Dream[employer]ear full facemask.  CPAP was initiated at a pressure of 5 cm but she started displaying central apneas even on lower pressures.  CPAP was titrated to a pressure of 9 cm at which time her AHI was 30/h.  She was switched to standard BiPAP therapy of 10/6 centimeters with ongoing central apneas, and a  backup rate was added at 12/min but failed to provide sufficient ventilations and caused increase in arousals.  She also appeared to have Cheyne-Stokes type breathing during sleep epochs 326 through 391 while on BiPAP.  She was therefore switched to ASV and it worked significantly better with dramatic reduction in her central apneas.  She was started on an EPAP of 9 cm and it was increased to a final pressure of EPAP of 10 with minimum pressure support of 3 and maximum pressure at support of 12 cm.  On ASV with EPAP of 9 cm she did achieve an AHI of 1/h, O2 nadir 94%, and nonsupine REM sleep achieved.    Lights Out was at 21:28 and Lights On at 04:48. Total recording time (TRT) was 441 minutes, with a total sleep time (TST) of 221 minutes. The patient's sleep latency to persistent sleep was delayed at 78.5 minutes. REM latency was 160 minutes, which is delayed.  The sleep efficiency was 50.1 %.    SLEEP ARCHITECTURE: WASO (Wake after sleep onset) was 184 minutes with several longer periods of wakefulness. There were 54.5 minutes in Stage N1, 100.5 minutes Stage N2, 24.5 minutes Stage N3 and 41.5 minutes in Stage REM.  The percentage of Stage N1 was 24.7%, which is markedly increased, Stage N2 was 45.5%, Stage N3 was 11.1% and Stage R (REM sleep) was 18.8%. The arousals were noted as: 52 were spontaneous, 0 were associated with PLMs, 22 were associated with respiratory events.  RESPIRATORY ANALYSIS:  There was a total of 50 respiratory events: 0 obstructive apneas, 42 central apneas and 0 mixed apneas with a total of 42 apneas and an apnea index (AI) of 11.4 /hour. There were 8 hypopneas with a hypopnea index of 2.2/hour. The patient also had 0 respiratory event related arousals (RERAs).      The total APNEA/HYPOPNEA INDEX  (AHI) was 13.6 /hour and the total RESPIRATORY DISTURBANCE INDEX was 13.6 /hour  10 events occurred in REM sleep and 40 events in NREM. The REM AHI was 14.5 /hour versus a non-REM  AHI of 13.4 /hour.  The patient spent 151 minutes of total sleep time in the supine position and 70 minutes in non-supine. The supine AHI was 14.3, versus a non-supine AHI of 12.0.  OXYGEN SATURATION & C02:  The baseline 02 saturation was 94%, with the lowest being 87%. Time spent below 89% saturation equaled 1 minutes.  PERIODIC LIMB MOVEMENTS:  The patient had a total of 0 Periodic Limb Movements. The Periodic Limb Movement (PLM) index was 0 and the PLM Arousal index was 0 /hour.  Audio and video analysis did not show any abnormal or unusual movements, behaviors, phonations or vocalizations. The patient took 2 bathroom breaks. The single lead EKG showed a wide QRS complex and was in keeping with atrial fibrillation.   Post-study, the patient indicated that sleep was the same as usual.   IMPRESSION:   1. Obstructive Sleep Apnea (OSA) 2. Central sleep apnea 3. Insufficient treatment with CPAP 4. Dysfunctions associated with sleep stages or arousal from sleep 5. Non-specific abnormal EKG   RECOMMENDATIONS:   1. The study showed significant central apneas and insufficient treatment of her severe sleep disordered breathing with CPAP only.  Standard BiPAP and BiPAP ST were not successful in reducing her central sleep disordered breathing and respiratory event related arousals.  ASV (adaptive servo ventilation) provided significant improvement and near complete resolution of her obstructive and central sleep apnea.  I will recommend home ASV treatment with an EPAP of 9 cm, minimum pressure support of 3 cm, maximum pressure support of 12 cm via medium DreamWear full facemask. The patient will be advised to be fully compliant with PAP therapy to improve sleep related symptoms and decrease long term cardiovascular risks. The patient should be reminded, that it may take up to 3 months to get fully used to using PAP with all planned sleep. The earlier full compliance is achieved, the better long term  compliance tends to be. Please note that untreated obstructive sleep apnea may carry additional perioperative morbidity. Patients with significant obstructive sleep apnea should receive perioperative PAP therapy and the surgeons and particularly the anesthesiologist should be informed of the diagnosis and the severity of the sleep disordered breathing. 2. This study shows sleep fragmentation and abnormal sleep stage percentages; these are nonspecific findings and per se do not signify an intrinsic sleep disorder or a cause for the patient's sleep-related symptoms. Causes include (but are not limited to) the first night effect of the sleep study, circadian rhythm disturbances, medication effect or an underlying mood disorder or medical problem.  3. The patient should be cautioned not to drive, work at heights, or operate dangerous or heavy equipment when tired or sleepy. Review and reiteration of good sleep hygiene measures should be pursued with any patient. 4. The study showed evidence of atrial fibrillation; clinical correlation is recommended. Patient is followed by cardiology.  5. The patient will be seen in follow-up in  the sleep clinic at [facility] for discussion of the test results, symptom and treatment compliance review, further management strategies, etc. The referring provider will be notified of the test results.   I certify that I have reviewed the entire raw data recording prior to the issuance of this report in accordance with the Standards of Accreditation of the American Academy of Sleep Medicine (AASM)   Star Age, MD, PhD Diplomat, American Board of Neurology and Sleep Medicine (Neurology and Sleep Medicine)

## 2020-07-01 NOTE — Telephone Encounter (Signed)
-----   Message from Star Age, MD sent at 07/01/2020  7:44 AM EST ----- FYI, DOB wrong in both the baseline and titration study. Saw it after signing. Patient referred by Dr. Leonie Man and Janett Billow, seen by me on 02/27/20, diagnostic PSG on 03/12/20.  Patient had a CPAP titration study on 06/22/20.  Please call and inform patient that I have entered an order for treatment with positive airway pressure (PAP) treatment for obstructive sleep apnea (OSA).  She has both obstructive and central sleep apnea and did not do well on CPAP only.  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  I recommend that she start treatment with this machine at home to improve her sleep apnea consistently.  I will prescribe for this machine to be used at home and we will send the order to a local durable medical equipment company.  Please remind her regarding the compliance with treatment not just for patient health and success but also for insurance reasons.  She will need a follow-up within 31 to 89 days of starting treatment.  Please arrange for the follow-up accordingly.   Star Age, MD, PhD [facility])

## 2020-07-01 NOTE — Telephone Encounter (Signed)
I called pt. No answer, left a message asking pt to call me back.   

## 2020-07-01 NOTE — Telephone Encounter (Signed)
Pt returned call. Please call back when available. 

## 2020-07-02 ENCOUNTER — Ambulatory Visit (INDEPENDENT_AMBULATORY_CARE_PROVIDER_SITE_OTHER): Payer: Medicare Other

## 2020-07-02 DIAGNOSIS — I4819 Other persistent atrial fibrillation: Secondary | ICD-10-CM

## 2020-07-05 LAB — CUP PACEART REMOTE DEVICE CHECK
Battery Remaining Longevity: 120 mo
Battery Voltage: 3.16 V
Brady Statistic AP VP Percent: 0 %
Brady Statistic AP VS Percent: 0 %
Brady Statistic AS VP Percent: 99.28 %
Brady Statistic AS VS Percent: 0.72 %
Brady Statistic RA Percent Paced: 0 %
Brady Statistic RV Percent Paced: 99.28 %
Date Time Interrogation Session: 20220310052548
Implantable Lead Implant Date: 20211206
Implantable Lead Implant Date: 20211206
Implantable Lead Location: 753858
Implantable Lead Location: 753860
Implantable Lead Model: 3830
Implantable Lead Model: 5076
Implantable Pulse Generator Implant Date: 20211206
Lead Channel Impedance Value: 342 Ohm
Lead Channel Impedance Value: 361 Ohm
Lead Channel Impedance Value: 399 Ohm
Lead Channel Impedance Value: 494 Ohm
Lead Channel Pacing Threshold Amplitude: 1.5 V
Lead Channel Pacing Threshold Pulse Width: 0.4 ms
Lead Channel Sensing Intrinsic Amplitude: 11.25 mV
Lead Channel Sensing Intrinsic Amplitude: 11.25 mV
Lead Channel Sensing Intrinsic Amplitude: 7 mV
Lead Channel Sensing Intrinsic Amplitude: 7.375 mV
Lead Channel Setting Pacing Amplitude: 3 V
Lead Channel Setting Pacing Pulse Width: 0.4 ms
Lead Channel Setting Sensing Sensitivity: 2 mV

## 2020-07-06 DIAGNOSIS — Z950 Presence of cardiac pacemaker: Secondary | ICD-10-CM | POA: Insufficient documentation

## 2020-07-06 DIAGNOSIS — I4891 Unspecified atrial fibrillation: Secondary | ICD-10-CM | POA: Insufficient documentation

## 2020-07-09 ENCOUNTER — Telehealth: Payer: Self-pay | Admitting: Family Medicine

## 2020-07-09 NOTE — Telephone Encounter (Signed)
Pt form has been received and placed on Dr Sarajane Jews folder for completing

## 2020-07-09 NOTE — Telephone Encounter (Signed)
Paperwork was dropped off and placed in Dr. Barbie Banner folder. It needs to be filled out and faxed.

## 2020-07-10 NOTE — Progress Notes (Signed)
Remote pacemaker transmission.   

## 2020-07-13 ENCOUNTER — Ambulatory Visit (INDEPENDENT_AMBULATORY_CARE_PROVIDER_SITE_OTHER): Payer: Medicare Other | Admitting: Internal Medicine

## 2020-07-13 ENCOUNTER — Encounter: Payer: Self-pay | Admitting: Internal Medicine

## 2020-07-13 ENCOUNTER — Other Ambulatory Visit: Payer: Self-pay

## 2020-07-13 VITALS — BP 160/80 | HR 81 | Ht 66.0 in | Wt 128.0 lb

## 2020-07-13 DIAGNOSIS — R42 Dizziness and giddiness: Secondary | ICD-10-CM

## 2020-07-13 DIAGNOSIS — Z79899 Other long term (current) drug therapy: Secondary | ICD-10-CM | POA: Diagnosis not present

## 2020-07-13 DIAGNOSIS — R002 Palpitations: Secondary | ICD-10-CM

## 2020-07-13 DIAGNOSIS — I4819 Other persistent atrial fibrillation: Secondary | ICD-10-CM

## 2020-07-13 DIAGNOSIS — Z950 Presence of cardiac pacemaker: Secondary | ICD-10-CM

## 2020-07-13 DIAGNOSIS — I639 Cerebral infarction, unspecified: Secondary | ICD-10-CM

## 2020-07-13 LAB — BASIC METABOLIC PANEL
BUN/Creatinine Ratio: 15 (ref 12–28)
BUN: 11 mg/dL (ref 8–27)
CO2: 24 mmol/L (ref 20–29)
Calcium: 9.7 mg/dL (ref 8.7–10.3)
Chloride: 100 mmol/L (ref 96–106)
Creatinine, Ser: 0.71 mg/dL (ref 0.57–1.00)
Glucose: 139 mg/dL — ABNORMAL HIGH (ref 65–99)
Potassium: 4.5 mmol/L (ref 3.5–5.2)
Sodium: 140 mmol/L (ref 134–144)
eGFR: 85 mL/min/{1.73_m2} (ref >59)

## 2020-07-13 NOTE — Patient Instructions (Signed)
Medication Instructions:  Your physician recommends that you continue on your current medications as directed. Please refer to the Current Medication list given to you today.  *If you need a refill on your cardiac medications before your next appointment, please call your pharmacy*   Lab Work: BMET today  If you have labs (blood work) drawn today and your tests are completely normal, you will receive your results only by: Marland Kitchen MyChart Message (if you have MyChart) OR . A paper copy in the mail If you have any lab test that is abnormal or we need to change your treatment, we will call you to review the results.   Testing/Procedures: None ordered.    Follow-Up: At [facility], you and your health needs are our priority.  As part of our continuing mission to provide you with exceptional heart care, we have created designated Provider Care Teams.  These Care Teams include your primary Cardiologist (physician) and Advanced Practice Providers (APPs -  Physician Assistants and Nurse Practitioners) who all work together to provide you with the care you need, when you need it.  We recommend signing up for the patient portal called "MyChart".  Sign up information is provided on this After Visit Summary.  MyChart is used to connect with patients for Virtual Visits (Telemedicine).  Patients are able to view lab/test results, encounter notes, upcoming appointments, etc.  Non-urgent messages can be sent to your provider as well.   To learn more about what you can do with MyChart, go to [email or web address].    Your next appointment:   12 month(s)  The format for your next appointment:   In Person  Provider:   Virl Axe, MD   Other Instructions Please send [place] a MyChart message to let us know if reprogramming has helped.

## 2020-07-13 NOTE — Progress Notes (Signed)
Patient Care Team: [name], MD as PCP - General Nahser, Wonda Cheng, MD as PCP - Cardiology (Cardiology)   HPI  [facility] Mary Mercado is a 80 y.o. female Seen in follow-up for atrial fibrillation with a rapid ventricular response for which he underwent AV junction ablation and pacing 12/21 with 2 RV leads, one RV apical M1 left bundle branch block area.  Base rate was decreased to 70 and rate response was activated  She felt very good following device implantation and AV ablation.  She felt good with reprogrammed from 90--80 but after reprogramming to 80--70 with activation of rate response and the making of her rate response even more aggressive couple weeks later felt like she was in atrial fibrillation most of the time.  She came to the office and felt like she could not get the device clinic to reprogram her  DATE TEST EF   2/20 TEE  60-65 % LAE severe  9/21 Echo   60-65 % BAE severe (LA 47ml/m2)        Date Cr K Hgb  11/21 0.8 3.5 14.0  3/22 0.63 3.5 34.7   Thromboembolic risk factors ( age  -2, HTN-1, TIA/CVA-2, Gender-1) for a CHADSVASc Score of 6    Records and Results Reviewed   Past Medical History:  Diagnosis Date  . Allergy   . CVA (cerebral vascular accident) ([place]) 05/2018, 01/09/20  . Hyperlipidemia LDL goal <70   . Hypertension   . Palpitations   . Vertigo, benign positional     Past Surgical History:  Procedure Laterality Date  . AV NODE ABLATION N/A 03/30/2020   Procedure: AV NODE ABLATION;  Surgeon: Deboraha Sprang, MD;  Location: [place] CV LAB;  Service: Cardiovascular;  Laterality: N/A;  . AV NODE ABLATION N/A 03/30/2020   Procedure: AV NODE ABLATION;  Surgeon: Deboraha Sprang, MD;  Location: [place] CV LAB;  Service: Cardiovascular;  Laterality: N/A;  . CARDIOVERSION Left 10/31/2018   Procedure: CARDIOVERSION;  Surgeon: Thayer Headings, MD;  Location: [place];  Service: Cardiovascular;  Laterality: Left;  .  CARDIOVERSION N/A 01/08/2020   Procedure: CARDIOVERSION;  Surgeon: [name], MD;  Location: [facility] ENDOSCOPY;  Service: Cardiovascular;  Laterality: N/A;  . Hemilaminectomy and microdiskectomy at L4-5 on the left.  12/10/2003  . PACEMAKER IMPLANT N/A 03/30/2020   Procedure: PACEMAKER IMPLANT;  Surgeon: Deboraha Sprang, MD;  Location: [place] CV LAB;  Service: Cardiovascular;  Laterality: N/A;  . TEE WITHOUT CARDIOVERSION N/A 06/05/2018   Procedure: TRANSESOPHAGEAL ECHOCARDIOGRAM (TEE);  Surgeon: Lelon Perla, MD;  Location: [facility] ENDOSCOPY;  Service: Cardiovascular;  Laterality: N/A;  loop    Current Meds  Medication Sig  . atorvastatin (LIPITOR) 80 MG tablet TAKE 1 TABLET (80 MG TOTAL) BY MOUTH DAILY AT 6 PM.  . levETIRAcetam (KEPPRA) 500 MG tablet Take 1 tablet (500 mg total) by mouth 2 (two) times daily.  Marland Kitchen losartan (COZAAR) 50 MG tablet Take 1 tablet (50 mg total) by mouth daily.  . metoprolol tartrate (LOPRESSOR) 25 MG tablet Take 0.5 tablets (12.5 mg total) by mouth at bedtime.  Marland Kitchen PRADAXA 150 MG CAPS capsule TAKE 1 CAPSULE (150 MG TOTAL) BY MOUTH EVERY 12 (TWELVE) HOURS.    Allergies  Allergen Reactions  . Duloxetine     Causes Bp elevation.  Increased body jerks, nausea  . Acetaminophen Hives and Rash      Review of Systems negative except from HPI  and PMH  Physical Exam BP (!) 160/80 (BP Location: Left Arm, Patient Position: Sitting, Cuff Size: Normal)   Pulse 81   Ht 5\' 6"  (1.676 m)   Wt 128 lb (58.1 kg)   SpO2 98%   BMI 20.66 kg/m  Well developed and well nourished in no acute distress HENT normal E scleral and icterus clear Neck Supple JVP flat; carotids brisk and full Clear to ausculation Device pocket well healed; without hematoma or erythema.  There is no tethering Regular rate and rhythm, no murmurs gallops or rub Soft with active bowel sounds No clubbing cyanosis  Edema Alert and oriented, grossly normal motor and sensory function nystagmus with  right lateral gaze Skin Warm and Dry  ECG atrial fib   Vpacing at 81  Estimated Creatinine Clearance: 50.6 mL/min (by C-G formula based on SCr of 0.63 mg/dL).   Assessment and  Plan Atrial fibrillation-permanent  AV ablation  Pacemaker-Medtronic-2 V ventricular leads  Dizziness with vertigo and nystagmus  Patient was quite symptomatic with the activation of rate responsiveness reprogramming to make him more active.  We have turned off rate response reprogramming her to VVI at 80 with a sleep mode at 70 to try to minimize the rate response of symptoms.  She will let us know later this week how she is feeling  Suggested she take the meclizine prescribed a year ago.  Follow-up with her PCP as needed.      Current medicines are reviewed at length with the patient today .  The patient does not * have concerns regarding medicines.

## 2020-07-14 NOTE — Telephone Encounter (Signed)
Pt Resident Medical Information form is complete and has been faxed to [facility] as requested by pt, pt is aware to pick up forms in the office. A copy of the form was sent to scanning

## 2020-07-15 ENCOUNTER — Telehealth: Payer: Self-pay | Admitting: Family Medicine

## 2020-07-15 MED ORDER — MECLIZINE HCL 25 MG PO TABS: 25.0000 mg | ORAL_TABLET | ORAL | 11 refills | Status: DC | PRN

## 2020-07-15 NOTE — Telephone Encounter (Signed)
Pt husband call and stated that she need a refill on meclizine (ANTIVERT) tablet 12.5 mg   Sent to  [id number] [address], [place] Phone:  [phone]  Fax:  [phone]

## 2020-07-15 NOTE — Telephone Encounter (Signed)
Left message for patient to call back and schedule Medicare Annual Wellness Visit (AWV) either virtually or in office. No detail message left   Last AWV  No information  please schedule at anytime with [facility] Nurse Health Advisor 1 or 2   This should be a 45 minute visit.

## 2020-07-15 NOTE — Telephone Encounter (Signed)
Done

## 2020-07-15 NOTE — Telephone Encounter (Signed)
Medication currently not on medication list.  Last office visit- 02/05/2020

## 2020-07-16 NOTE — Telephone Encounter (Signed)
Called patient to inform prescription had been sent to pharmacy

## 2020-07-23 ENCOUNTER — Encounter: Payer: Medicare Other | Attending: Physical Medicine & Rehabilitation | Admitting: Physical Medicine & Rehabilitation

## 2020-07-23 ENCOUNTER — Encounter: Payer: Self-pay | Admitting: Physical Medicine & Rehabilitation

## 2020-07-23 ENCOUNTER — Other Ambulatory Visit: Payer: Self-pay

## 2020-07-23 VITALS — BP 153/86 | HR 80 | Temp 98.2°F | Ht 66.0 in | Wt 126.0 lb

## 2020-07-23 DIAGNOSIS — I639 Cerebral infarction, unspecified: Secondary | ICD-10-CM

## 2020-07-23 DIAGNOSIS — I69398 Other sequelae of cerebral infarction: Secondary | ICD-10-CM | POA: Diagnosis not present

## 2020-07-23 DIAGNOSIS — R269 Unspecified abnormalities of gait and mobility: Secondary | ICD-10-CM

## 2020-07-23 NOTE — Progress Notes (Signed)
Subjective:    Patient ID: Mary Mercado, female    DOB: [birth year], 80 y.o.   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  80 y.o. Rh-female with history of CAD, right-CVA, PAF s/p DCCV recently; who was admitted on 01/09/2020 with acute onset of dizziness, slurred speech and nausea and vomiting while at cardiologist office.  CTA head/neck showed moderate to severe stenosis left HP and moderate stenosis right P2 segment.  MRI brain showed acute 3 mm infarct in right-ACA and evolution of prior right-MCA infarct.  2D echo showed EF 60 to 65% with biatrial dilatation, moderate PAH and mild to moderate aortic valve sclerosis.  Stroke was felt to be embolic due to A. fib and Eliquis was changed to Pradaxa per Dr. Clydene Fake input.  Therapy evaluations done revealing mild dysarthria with mild memory deficits, vestibular symptoms as well as balance deficits affecting ADLs and mobility.  CIR was recommended due to functional decline. She has had improvement in functional use RUE  and RLE as well as improvement in awareness. She is able to complete ADL tasks at modified independent level.  She requires supervision with mobility and Berg balance score has improved to 48/56.  She is able to use speech strategies at modified independent level and speech intelligibility has improved.  Admit date: 01/14/2020 Discharge date: 01/22/2020 HPI  Interval medical history, had seizure involving left upper extremity onset 06/28/2019 was admitted for 37 hours.  CT head showed no evidence of new CVA.  The patient feels like she has gotten back to her previous.  She has been seen by Dr. Caryl Comes who has okayed her from a cardiac standpoint to participate in physical therapy.  She is also had no new seizures and is on Keppra. We discussed that by state law she cannot drive unless cleared by neurology and she needs to be at least 6 months seizure-free. Pain Inventory Average  Pain 0 Pain Right Now 0 My pain is tingling  In the last 24 hours, has pain interfered with the following? General activity 0 Relation with others 0 Enjoyment of life 0 What TIME of day is your pain at its worst? na Sleep (in general) Good  Pain is worse with: na Pain improves with: na Relief from Meds: na  Family History  Problem Relation Age of Onset  . Colon cancer Father   . Diabetes Mother   . Heart attack Mother        61  . Heart attack Brother 2   Social History   Socioeconomic History  . Marital status: Married    Spouse name: [name]  . Number of children: Not on file  . Years of education: 12+  . Highest education level: Not on file  Occupational History    Comment: retired  Tobacco Use  . Smoking status: Never Smoker  . Smokeless tobacco: Never Used  Vaping Use  . Vaping Use: Never used  Substance and Sexual Activity  . Alcohol use: Never  . Drug use: Never  . Sexual activity: Not on file  Other Topics Concern  . Not on file  Social History Narrative   Lives with husband   caffeine none now   Social Determinants of Health   Financial Resource Strain: Not on file  Food Insecurity: Not on file  Transportation Needs: Not on file  Physical Activity: Not on file  Stress: Not on file  Social Connections: Not on file   Past  Surgical History:  Procedure Laterality Date  . AV NODE ABLATION N/A 03/30/2020   Procedure: AV NODE ABLATION;  Surgeon: Deboraha Sprang, MD;  Location: [place] CV LAB;  Service: Cardiovascular;  Laterality: N/A;  . AV NODE ABLATION N/A 03/30/2020   Procedure: AV NODE ABLATION;  Surgeon: Deboraha Sprang, MD;  Location: [place] CV LAB;  Service: Cardiovascular;  Laterality: N/A;  . CARDIOVERSION Left 10/31/2018   Procedure: CARDIOVERSION;  Surgeon: Thayer Headings, MD;  Location: [place];  Service: Cardiovascular;  Laterality: Left;  . CARDIOVERSION N/A 01/08/2020   Procedure: CARDIOVERSION;  Surgeon: [name], MD;  Location: [facility] ENDOSCOPY;  Service: Cardiovascular;  Laterality: N/A;  . Hemilaminectomy and microdiskectomy at L4-5 on the left.  12/10/2003  . PACEMAKER IMPLANT N/A 03/30/2020   Procedure: PACEMAKER IMPLANT;  Surgeon: Deboraha Sprang, MD;  Location: [place] CV LAB;  Service: Cardiovascular;  Laterality: N/A;  . TEE WITHOUT CARDIOVERSION N/A 06/05/2018   Procedure: TRANSESOPHAGEAL ECHOCARDIOGRAM (TEE);  Surgeon: Lelon Perla, MD;  Location: [facility] ENDOSCOPY;  Service: Cardiovascular;  Laterality: N/A;  loop   Past Surgical History:  Procedure Laterality Date  . AV NODE ABLATION N/A 03/30/2020   Procedure: AV NODE ABLATION;  Surgeon: Deboraha Sprang, MD;  Location: [place] CV LAB;  Service: Cardiovascular;  Laterality: N/A;  . AV NODE ABLATION N/A 03/30/2020   Procedure: AV NODE ABLATION;  Surgeon: Deboraha Sprang, MD;  Location: [place] CV LAB;  Service: Cardiovascular;  Laterality: N/A;  . CARDIOVERSION Left 10/31/2018   Procedure: CARDIOVERSION;  Surgeon: Thayer Headings, MD;  Location: [place];  Service: Cardiovascular;  Laterality: Left;  . CARDIOVERSION N/A 01/08/2020   Procedure: CARDIOVERSION;  Surgeon: [name], MD;  Location: [facility] ENDOSCOPY;  Service: Cardiovascular;  Laterality: N/A;  . Hemilaminectomy and microdiskectomy at L4-5 on the left.  12/10/2003  . PACEMAKER IMPLANT N/A 03/30/2020   Procedure: PACEMAKER IMPLANT;  Surgeon: Deboraha Sprang, MD;  Location: [place] CV LAB;  Service: Cardiovascular;  Laterality: N/A;  . TEE WITHOUT CARDIOVERSION N/A 06/05/2018   Procedure: TRANSESOPHAGEAL ECHOCARDIOGRAM (TEE);  Surgeon: Lelon Perla, MD;  Location: [facility] ENDOSCOPY;  Service: Cardiovascular;  Laterality: N/A;  loop   Past Medical History:  Diagnosis Date  . Allergy   . CVA (cerebral vascular accident) ([place]) 05/2018, 01/09/20  . Hyperlipidemia LDL goal <70   . Hypertension   . Palpitations   . Vertigo, benign positional    BP (!) 153/86    Pulse 80   Temp 98.2 F (36.8 C)   Ht 5\' 6"  (1.676 m)   Wt 126 lb (57.2 kg)   SpO2 95%   BMI 20.34 kg/m   Opioid Risk Score:   Fall Risk Score:  `1  Depression screen PHQ 2/9  Depression screen [facility] 2/9 02/07/2020 09/20/2018  Decreased Interest 2 0  Down, Depressed, Hopeless 0 1  PHQ - 2 Score 2 1  Altered sleeping 1 -  Tired, decreased energy 0 -  Change in appetite 0 -  Feeling bad or failure about yourself  0 -  Trouble concentrating 0 -  Moving slowly or fidgety/restless 2 -  Suicidal thoughts 0 -  PHQ-9 Score 5 -  Some recent data might be hidden     Review of Systems  Neurological: Positive for numbness.       Tingling  All other systems reviewed and are negative.      Objective:   Physical  Exam Vitals and nursing note reviewed.  Constitutional:      Appearance: She is normal weight.  Eyes:     Extraocular Movements: Extraocular movements intact.     Conjunctiva/sclera: Conjunctivae normal.     Pupils: Pupils are equal, round, and reactive to light.  Neurological:     Mental Status: She is alert and oriented to person, place, and time. Mental status is at baseline.     Comments: Motor strength is 4/5 in the left deltoid, bicep, tricep, grip, hip flexor, knee extensor, ankle dorsiflexor plantar flexor Motor strength on the right is 5/5 in the upper and lower extremity Romberg is positive Sensation is equal bilateral upper and lower extremities No evidence of dysdiadochokinesis with rapid alternating supination pronation of the upper extremities Equal finger to thumb opposition in the upper extremities.  Psychiatric:        Mood and Affect: Mood normal.        Behavior: Behavior normal.   Ambulates with a walker wide-based gait.  No evidence of toe drag or knee instability        Assessment & Plan:  #1.  History of right MCA infarct in 2020 as well as right ACA infarct in 2021.  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  I am not confident that she should be driving anyhow with her multiple medical issues as well as her residual stroke deficits.

## 2020-07-23 NOTE — Patient Instructions (Signed)
Neuro rehab should call you for appt

## 2020-07-29 NOTE — Telephone Encounter (Signed)
Pt called wanting to inform the RN that she has started her cpap machine on 07/21/20. Pt states she has an appt scheduled already and is wanting to know if she still needs to schedule an appt 60 days after she started her machine. Please advise.

## 2020-07-29 NOTE — Telephone Encounter (Signed)
I contacted the Mary Mercado and advised we would need to see her back 61-90 days after starting the machine.   Appt has been made for 10/11/20 at 930 am with Dr. Rexene Alberts.  To note Mary Mercado reports she is doing well on the machine.

## 2020-08-18 ENCOUNTER — Other Ambulatory Visit: Payer: Self-pay

## 2020-08-18 ENCOUNTER — Ambulatory Visit: Payer: Medicare Other | Attending: Physical Medicine & Rehabilitation

## 2020-08-18 VITALS — BP 142/90 | HR 80

## 2020-08-18 DIAGNOSIS — R2689 Other abnormalities of gait and mobility: Secondary | ICD-10-CM | POA: Diagnosis not present

## 2020-08-18 DIAGNOSIS — M6281 Muscle weakness (generalized): Secondary | ICD-10-CM | POA: Diagnosis not present

## 2020-08-18 DIAGNOSIS — R2681 Unsteadiness on feet: Secondary | ICD-10-CM | POA: Insufficient documentation

## 2020-08-19 NOTE — Therapy (Signed)
[facility] [address] [place], [place], [address] Phone: [phone]   Fax:  [phone]  Physical Therapy Evaluation  Patient Details  Name: Mary Mercado MRN: [id number] Date of Birth: April 25, 1941 Referring Provider (PT): [name]   Encounter Date: 08/18/2020   PT End of Session - 08/18/20 1318    Visit Number 1    Number of Visits 17    Date for PT Re-Evaluation 11/16/20   60 day poc, 90 day cert   Authorization Type Medicare so 10th visit progress note    PT Start Time 1315    PT Stop Time 1403    PT Time Calculation (min) 48 min    Equipment Utilized During Treatment Gait belt    Activity Tolerance Patient tolerated treatment well    Behavior During Therapy [facility] for tasks assessed/performed           Past Medical History:  Diagnosis Date  . Allergy   . CVA (cerebral vascular accident) ([place]) 05/2018, 01/09/20  . Hyperlipidemia LDL goal <70   . Hypertension   . Palpitations   . Vertigo, benign positional     Past Surgical History:  Procedure Laterality Date  . AV NODE ABLATION N/A 03/30/2020   Procedure: AV NODE ABLATION;  Surgeon: Deboraha Sprang, MD;  Location: [facility];  Service: Cardiovascular;  Laterality: N/A;  . AV NODE ABLATION N/A 03/30/2020   Procedure: AV NODE ABLATION;  Surgeon: Deboraha Sprang, MD;  Location: [place] [facility];  Service: Cardiovascular;  Laterality: N/A;  . CARDIOVERSION Left 10/31/2018   Procedure: CARDIOVERSION;  Surgeon: Thayer Headings, MD;  Location: [facility];  Service: Cardiovascular;  Laterality: Left;  . CARDIOVERSION N/A 01/08/2020   Procedure: CARDIOVERSION;  Surgeon: [name], MD;  Location: [facility] ENDOSCOPY;  Service: Cardiovascular;  Laterality: N/A;  . Hemilaminectomy and microdiskectomy at L4-5 on the left.  12/10/2003  . PACEMAKER IMPLANT N/A 03/30/2020   Procedure: PACEMAKER IMPLANT;  Surgeon: Deboraha Sprang, MD;   Location: [place] [facility];  Service: Cardiovascular;  Laterality: N/A;  . TEE WITHOUT CARDIOVERSION N/A 06/05/2018   Procedure: TRANSESOPHAGEAL ECHOCARDIOGRAM (TEE);  Surgeon: Lelon Perla, MD;  Location: [facility] ENDOSCOPY;  Service: Cardiovascular;  Laterality: N/A;  loop    Vitals:   08/18/20 1346  BP: (!) 142/90  Pulse: 80      Subjective Assessment - 08/18/20 1319    Subjective Pt returns to therapy after having been out due to pacemaker placement and recent post stroke seizure. She is now on Keppra. Pt reports that she doesn't really have the dizziness as much as she used to but does feel like she is quivering internally. She is still walking with rollator almost all the time unless she has short distance in home with something in each side she can touch. Denies any falls. Reports that her stamina is still not great.    Patient is accompained by: Family member    Pertinent History CAD, hx of CVA, PAF s/p DCCV, BPPV, a-fib, HLD    Patient Stated Goals Pt would like to be able to live normally with moving and walking easier.    Currently in Pain? No/denies              [facility] PT Assessment - 08/18/20 1325      Assessment   Medical Diagnosis gait disturbance, post stroke    Referring Provider (PT) [name]    Onset Date/Surgical  Date 07/23/20    Hand Dominance Right    Prior Therapy outpatient PT      Precautions   Precautions Fall;ICD/Pacemaker   just pacemaker, no defibrillator. Set at 57 but at night goes down to 70.     Balance Screen   Has the patient fallen in the past 6 months No    Has the patient had a decrease in activity level because of a fear of falling?  No    Is the patient reluctant to leave their home because of a fear of falling?  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     Observation/Other Assessments   Observations right eye has cataract on it so vision not as good, peripheral vision intact, small print difficult      Observation/Other Assessments-Edema    Edema --   mild pitting edema at ankles     Sensation   Light Touch Appears Intact    Additional Comments Pt does report left foot feeling different than right and at times gets some numbness in left hand      ROM / Strength   AROM / PROM / Strength Strength      Strength   Strength Assessment Site Shoulder;Elbow;Hand;Hip;Knee;Ankle    Right/Left Shoulder Right;Left    Right Shoulder Flexion 4/5    Left Shoulder Flexion 4/5    Right/Left Elbow Right;Left    Right Elbow Flexion 4+/5    Right Elbow Extension 4+/5    Left Elbow Flexion 4+/5    Left Elbow Extension 4+/5    Right/Left hand Right;Left    Right Hand Gross Grasp Functional    Left Hand Gross Grasp Functional    Right/Left Hip Right;Left    Right Hip Flexion 4/5    Left Hip Flexion 4/5    Right/Left Knee Right;Left    Right Knee Flexion 4+/5    Right Knee Extension 4+/5    Left Knee Flexion 4+/5    Left Knee Extension 4+/5    Right/Left Ankle Right;Left    Right Ankle Dorsiflexion 4+/5    Left Ankle Dorsiflexion 4+/5      Transfers   Transfers Sit to Stand;Stand to Sit    Sit to Stand 6: Modified independent (Device/Increase time)    Five time sit to stand comments  18.07 from chair without hands     Stand to Sit 6: Modified independent (Device/Increase time)      Ambulation/Gait   Ambulation/Gait Yes    Ambulation/Gait Assistance 6: Modified independent (Device/Increase time);5: Supervision    Ambulation/Gait Assistance Details mod I with rollator, supervision without AD    Ambulation Distance (Feet) 200 Feet  Assistive device Rollator;None    Gait Pattern Step-through pattern;Decreased step length - right;Decreased step length - left;Decreased arm swing - right;Decreased arm swing - left    Ambulation Surface Level;Indoor    Gait velocity 15.42 sec with rollator=0.71m/s and 17.08 without AD=.41m/s    Stairs Yes    Stairs Assistance 5: Supervision    Stairs Assistance Details (indicate cue type and reason) reciprocal up and step-to down without rails and reciprocal both ways with rail.    Stair Management Technique Alternating pattern;Step to pattern;Two rails;No rails    Number of Stairs 8    Height of Stairs 6      Standardized Balance Assessment   Standardized Balance Assessment Timed Up and Go Test      Timed Up and Go Test   TUG Normal TUG    Normal TUG (seconds) 14.67   without AD     Functional Gait  Assessment   Gait assessed  Yes    Gait Level Surface Walks 20 ft, slow speed, abnormal gait pattern, evidence for imbalance or deviates 10-15 in outside of the 12 in walkway width. Requires more than 7 sec to ambulate 20 ft.    Change in Gait Speed Able to change speed, demonstrates mild gait deviations, deviates 6-10 in outside of the 12 in walkway width, or no gait deviations, unable to achieve a major change in velocity, or uses a change in velocity, or uses an assistive device.    Gait with Horizontal Head Turns Performs head turns with moderate changes in gait velocity, slows down, deviates 10-15 in outside 12 in walkway width but recovers, can continue to walk.    Gait with Vertical Head Turns Performs task with moderate change in gait velocity, slows down, deviates  10-15 in outside 12 in walkway width but recovers, can continue to walk.    Gait and Pivot Turn Pivot turns safely in greater than 3 sec and stops with no loss of balance, or pivot turns safely within 3 sec and stops with mild imbalance, requires small steps to catch balance.    Step Over Obstacle Is able to step over one shoe box (4.5 in total height) without changing gait speed. No evidence of imbalance.    Gait with Narrow Base of Support Is able to ambulate for 10 steps heel to toe with no staggering.    Gait with Eyes Closed Cannot walk 20 ft without assistance, severe gait deviations or imbalance, deviates greater than 15 in outside 12 in walkway width or will not attempt task.    Ambulating Backwards Walks 20 ft, uses assistive device, slower speed, mild gait deviations, deviates 6-10 in outside 12 in walkway width.    Steps Alternating feet, must use rail.    Total Score 16                      Objective measurements completed on examination: See above findings.               PT Education - 08/18/20 1932    Education Details Discussed results of testing and PT poc    Person(s) Educated Patient;Spouse    Methods Explanation    Comprehension Verbalized understanding            PT Short Term Goals - 08/19/20 0909      PT SHORT TERM GOAL #1   Title Pt will decrease TUG from 14.67 sec to <12 sec for improved balance and functional mobility.  Baseline 08/18/20 14.67 sec    Time 4    Period Weeks    Status New    Target Date 09/18/20      PT SHORT TERM GOAL #2   Title Pt will increase FGA from 16 to >19/30 for improved balance and gait safety.    Baseline 08/18/20 16/30    Time 4    Period Weeks    Status New    Target Date 09/18/20      PT SHORT TERM GOAL #3   Title Pt will ambulate >500' without AD supervision on level surfaces.    Time 4    Period Weeks    Status New    Target Date 09/18/20             PT Long Term Goals - 08/19/20 0912       PT LONG TERM GOAL #1   Title Pt will be independent with progressive HEP for strength, balance and functional mobility to continue gains on own.    Time 8    Period Weeks    Status New    Target Date 10/18/20      PT LONG TERM GOAL #2   Title Pt will increase gait speed to >0.70m/s without AD for improved gait safety.    Baseline 08/18/20 0.3m/s without AD    Time 8    Period Weeks    Status New    Target Date 10/18/20      PT LONG TERM GOAL #3   Title Pt will ambulate >800' on varied surfaces with LRAD mod I for improved community mobility.    Time 8    Period Weeks    Status New    Target Date 10/18/20      PT LONG TERM GOAL #4   Title Pt will increase FGA from 16 to >22/30 for improved balance and gait safety.    Baseline 08/18/20 16/30    Time 8    Period Weeks    Status New    Target Date 10/18/20                  Plan - 08/18/20 1934    Clinical Impression Statement Pt is 80 y/o female who presents with gait difficulty with history of CVA and post stroke seizure. Pt also has new pacemaker implant. She has decreased strength mostly in hips grossly 4/5. Pt is currently walking with rollator. She is fall risk based on TUG of 14.67 sec, 5 x sit to stand of 18.07 sec and FGA of 16/30. She is ambulating at gait speed safe for household ambulation but decreased for community mobility. Pt will benefit from skilled PT to address strength, balance and functional mobility deficits.    Personal Factors and Comorbidities Age;Comorbidity 3+    Comorbidities CAD, hx of CVA, PAF s/p DCCV, BPPV, a-fib, HLD, seizure    Examination-Activity Limitations Bend;Bathing;Carry;Stand;Squat;Stairs;Toileting;Dressing;Transfers;Locomotion Level    Examination-Participation Restrictions Church;Yard Work;Driving;Community Activity;Laundry;Meal Prep;Cleaning    Stability/Clinical Decision Making Evolving/Moderate complexity    Clinical Decision Making Moderate    Rehab Potential Good     PT Frequency 2x / week    PT Duration 8 weeks    PT Treatment/Interventions ADLs/Self Care Home Management;Aquatic Therapy;Biofeedback;Canalith Repostioning;Balance training;Therapeutic exercise;Manual techniques;Vestibular;Therapeutic activities;Functional mobility training;Stair training;Gait training;Patient/family education;DME Instruction;Neuromuscular re-education    PT Next Visit Plan Monitor BP. Has pacemaker set at 90. Begin balance training and gait without AD.    Consulted and Agree with Plan of Care  Patient;Family member/caregiver    Family Member Consulted pt's husband: [name]           Patient will benefit from skilled therapeutic intervention in order to improve the following deficits and impairments:  Abnormal gait,Decreased endurance,Impaired sensation,Cardiopulmonary status limiting activity,Decreased knowledge of use of DME,Decreased activity tolerance,Decreased strength,Decreased balance,Decreased mobility,Dizziness,Impaired flexibility  Visit Diagnosis: Other abnormalities of gait and mobility  Muscle weakness (generalized)  Unsteadiness on feet     Problem List Patient Active Problem List   Diagnosis Date Noted  . A-fib ([place]) 07/06/2020  . Pacemaker - MDT 07/06/2020  . New onset seizure ([place]) 06/28/2020  . 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  . Hemiplegia and hemiparesis following cerebral infarction affecting left non-dominant side ([place]) 02/05/2020  . 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 CVA (cerebral vascular accident) ([place]) 01/09/2020  . Dizziness 11/30/2018  . Paroxysmal atrial fibrillation ([place]) 08/13/2018  . Prediabetes 06/15/2018  . Palpitations 06/05/2018  . Essential hypertension 06/05/2018  . Hyperlipidemia LDL goal <70 06/05/2018  . Acute ischemic right MCA stroke ([place]) 06/05/2018  . Stroke (cerebrum) ([place]) - R MCA s/p tPA, embolic, source unknown [id number]  . SVT (supraventricular tachycardia) ([place])  05/28/2015  . PVC's (premature ventricular contractions) 05/28/2015  . BENIGN POSITIONAL VERTIGO 06/16/2009  . ALLERGIC RHINITIS 06/16/2009    Electa Sniff, PT, DPT, NCS 08/19/2020, 9:15 AM  [facility] [address] [place], [place], [address] Phone: [phone]   Fax:  [phone]  Name: Mary Mercado MRN: [id number] Date of Birth: [birth year]

## 2020-08-21 ENCOUNTER — Ambulatory Visit: Payer: Medicare Other

## 2020-08-21 ENCOUNTER — Other Ambulatory Visit: Payer: Self-pay

## 2020-08-21 VITALS — BP 137/91 | HR 79

## 2020-08-21 DIAGNOSIS — M6281 Muscle weakness (generalized): Secondary | ICD-10-CM | POA: Diagnosis not present

## 2020-08-21 DIAGNOSIS — R2689 Other abnormalities of gait and mobility: Secondary | ICD-10-CM

## 2020-08-21 DIAGNOSIS — R2681 Unsteadiness on feet: Secondary | ICD-10-CM

## 2020-08-21 NOTE — Therapy (Signed)
[facility] [address] [place], [place], [address] Phone: [phone]   Fax:  [phone]  Physical Therapy Treatment  Patient Details  Name: Mary Mercado MRN: [id number] Date of Birth: 1940/07/16 Referring Provider (PT): [name]   Encounter Date: 08/21/2020   PT End of Session - 08/21/20 1446    Visit Number 2    Number of Visits 17    Date for PT Re-Evaluation 11/16/20   60 day poc, 90 day cert   Authorization Type Medicare so 10th visit progress note    PT Start Time 2774    PT Stop Time 1530    PT Time Calculation (min) 43 min    Equipment Utilized During Treatment Gait belt    Activity Tolerance Patient tolerated treatment well    Behavior During Therapy [facility] for tasks assessed/performed           Past Medical History:  Diagnosis Date  . Allergy   . CVA (cerebral vascular accident) ([place]) 05/2018, 01/09/20  . Hyperlipidemia LDL goal <70   . Hypertension   . Palpitations   . Vertigo, benign positional     Past Surgical History:  Procedure Laterality Date  . AV NODE ABLATION N/A 03/30/2020   Procedure: AV NODE ABLATION;  Surgeon: Deboraha Sprang, MD;  Location: [facility] [place] [facility];  Service: Cardiovascular;  Laterality: N/A;  . AV NODE ABLATION N/A 03/30/2020   Procedure: AV NODE ABLATION;  Surgeon: Deboraha Sprang, MD;  Location: [place] [facility];  Service: Cardiovascular;  Laterality: N/A;  . CARDIOVERSION Left 10/31/2018   Procedure: CARDIOVERSION;  Surgeon: Thayer Headings, MD;  Location: [facility];  Service: Cardiovascular;  Laterality: Left;  . CARDIOVERSION N/A 01/08/2020   Procedure: CARDIOVERSION;  Surgeon: [name], MD;  Location: [facility] ENDOSCOPY;  Service: Cardiovascular;  Laterality: N/A;  . Hemilaminectomy and microdiskectomy at L4-5 on the left.  12/10/2003  . PACEMAKER IMPLANT N/A 03/30/2020   Procedure: PACEMAKER IMPLANT;  Surgeon: Deboraha Sprang, MD;   Location: [place] [facility];  Service: Cardiovascular;  Laterality: N/A;  . TEE WITHOUT CARDIOVERSION N/A 06/05/2018   Procedure: TRANSESOPHAGEAL ECHOCARDIOGRAM (TEE);  Surgeon: Lelon Perla, MD;  Location: [facility] ENDOSCOPY;  Service: Cardiovascular;  Laterality: N/A;  loop    Vitals:   08/21/20 1454 08/21/20 1506  BP: (!) 141/90 (!) 137/91  Pulse: 80 79     Subjective Assessment - 08/21/20 1450    Subjective Patient reports no new changes/complaints. Reports that she has a very good day yesterday/today. No falls.    Patient is accompained by: Family member    Pertinent History CAD, hx of CVA, PAF s/p DCCV, BPPV, a-fib, HLD    Patient Stated Goals Pt would like to be able to live normally with moving and walking easier.    Currently in Pain? No/denies              [facility] Adult PT Treatment/Exercise - 08/21/20 0001      Transfers   Transfers Sit to Stand;Stand to Sit    Sit to Stand 4: Min guard;5: Supervision    Stand to Sit 4: Min guard;5: Supervision    Number of Reps 10 reps;1 set   completed with BLE placed on airex     Ambulation/Gait   Ambulation/Gait Yes    Ambulation/Gait Assistance 4: Min guard    Ambulation/Gait Assistance Details Completed ambulation x 345 ft without AD, PT providing cues for step length  and improved arm swing with ambulation. Followed by seated rest breaks, then completed x 245. Mild fatigue and unsteadiness noted with ambulation. Patient require seated rest break. Vitals WNL with completion.    Ambulation Distance (Feet) 345 Feet   x 1, 245 x 1   Assistive device None    Gait Pattern Step-through pattern;Decreased step length - right;Decreased step length - left;Decreased arm swing - right;Decreased arm swing - left    Ambulation Surface Level;Indoor            Balance Exercises - 08/21/20 0001      Balance Exercises: Standing   Standing Eyes Opened Narrow base of support (BOS);Head turns;Foam/compliant surface;Limitations    Standing Eyes  Opened Limitations stnading on airex without UE support and eyes open completed horizontal/vertical head turns 2 x 10 reps each.    Standing Eyes Closed Narrow base of support (BOS);Foam/compliant surface;3 reps;30 secs;Limitations    Standing Eyes Closed Limitations standing narrow BOS and eyes closed on foam 3 x 30 seconds, increased postural sway noted    SLS with Vectors Foam/compliant surface;Intermittent upper extremity assist;Limitations    SLS with Vectors Limitations standing on foam completed alternating toe taps to cones x 15 without UE support. increased challenge noted with SLS on RLE.               PT Short Term Goals - 08/19/20 0909      PT SHORT TERM GOAL #1   Title Pt will decrease TUG from 14.67 sec to <12 sec for improved balance and functional mobility.    Baseline 08/18/20 14.67 sec    Time 4    Period Weeks    Status New    Target Date 09/18/20      PT SHORT TERM GOAL #2   Title Pt will increase FGA from 16 to >19/30 for improved balance and gait safety.    Baseline 08/18/20 16/30    Time 4    Period Weeks    Status New    Target Date 09/18/20      PT SHORT TERM GOAL #3   Title Pt will ambulate >500' without AD supervision on level surfaces.    Time 4    Period Weeks    Status New    Target Date 09/18/20             PT Long Term Goals - 08/19/20 0912      PT LONG TERM GOAL #1   Title Pt will be independent with progressive HEP for strength, balance and functional mobility to continue gains on own.    Time 8    Period Weeks    Status New    Target Date 10/18/20      PT LONG TERM GOAL #2   Title Pt will increase gait speed to >0.60m/s without AD for improved gait safety.    Baseline 08/18/20 0.54m/s without AD    Time 8    Period Weeks    Status New    Target Date 10/18/20      PT LONG TERM GOAL #3   Title Pt will ambulate >800' on varied surfaces with LRAD mod I for improved community mobility.    Time 8    Period Weeks    Status New     Target Date 10/18/20      PT LONG TERM GOAL #4   Title Pt will increase FGA from 16 to >22/30 for improved balance and gait safety.  Baseline 08/18/20 16/30    Time 8    Period Weeks    Status New    Target Date 10/18/20             Plan - 08/21/20 1624    Clinical Impression Statement Today's skilled PT session included continued gait training without AD, working on improved imrpoved swing and step length. Fatigue throughout session require seated rest breaks, vitals WNL. Initiated corner balance activites with patient tolerating well. Will continue to progress toward all LTGs.    Personal Factors and Comorbidities Age;Comorbidity 3+    Comorbidities CAD, hx of CVA, PAF s/p DCCV, BPPV, a-fib, HLD, seizure    Examination-Activity Limitations Bend;Bathing;Carry;Stand;Squat;Stairs;Toileting;Dressing;Transfers;Locomotion Level    Examination-Participation Restrictions Church;Yard Work;Driving;Community Activity;Laundry;Meal Prep;Cleaning    Stability/Clinical Decision Making Evolving/Moderate complexity    Rehab Potential Good    PT Frequency 2x / week    PT Duration 8 weeks    PT Treatment/Interventions ADLs/Self Care Home Management;Aquatic Therapy;Biofeedback;Canalith Repostioning;Balance training;Therapeutic exercise;Manual techniques;Vestibular;Therapeutic activities;Functional mobility training;Stair training;Gait training;Patient/family education;DME Instruction;Neuromuscular re-education    PT Next Visit Plan Monitor BP. Has pacemaker set at 87. Initiate Balance HEP or Update [place]? Continue gait w/o AD, high level balance, functional strengthening/endurance.    Consulted and Agree with Plan of Care Patient;Family member/caregiver    Family Member Consulted pt's husband: [name]           Patient will benefit from skilled therapeutic intervention in order to improve the following deficits and impairments:  Abnormal gait,Decreased endurance,Impaired sensation,Cardiopulmonary  status limiting activity,Decreased knowledge of use of DME,Decreased activity tolerance,Decreased strength,Decreased balance,Decreased mobility,Dizziness,Impaired flexibility  Visit Diagnosis: Other abnormalities of gait and mobility  Muscle weakness (generalized)  Unsteadiness on feet     Problem List Patient Active Problem List   Diagnosis Date Noted  . A-fib ([place]) 07/06/2020  . Pacemaker - MDT 07/06/2020  . New onset seizure ([place]) 06/28/2020  . 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  . Hemiplegia and hemiparesis following cerebral infarction affecting left non-dominant side ([place]) 02/05/2020  . 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) 01/14/2020  . CVA (cerebral vascular accident) ([place]) 01/09/2020  . Dizziness 11/30/2018  . Paroxysmal atrial fibrillation ([place]) 08/13/2018  . Prediabetes 06/15/2018  . Palpitations 06/05/2018  . Essential hypertension 06/05/2018  . Hyperlipidemia LDL goal <70 06/05/2018  . Acute ischemic right MCA stroke ([place]) 06/05/2018  . Stroke (cerebrum) ([place]) - R MCA s/p tPA, embolic, source unknown 90/30/0923  . SVT (supraventricular tachycardia) ([place]) 05/28/2015  . PVC's (premature ventricular contractions) 05/28/2015  . BENIGN POSITIONAL VERTIGO 06/16/2009  . ALLERGIC RHINITIS 06/16/2009    Jones Bales, PT, DPT 08/21/2020, 4:28 PM  [facility] [address] [place], [place], [address] Phone: [phone]   Fax:  [phone]  Name: Mary Mercado MRN: [id number] Date of Birth: [birth year]

## 2020-08-24 DIAGNOSIS — D0472 Carcinoma in situ of skin of left lower limb, including hip: Secondary | ICD-10-CM | POA: Diagnosis not present

## 2020-08-25 ENCOUNTER — Other Ambulatory Visit: Payer: Self-pay

## 2020-08-25 ENCOUNTER — Ambulatory Visit: Payer: Medicare Other | Attending: Physical Medicine & Rehabilitation

## 2020-08-25 VITALS — BP 118/78 | HR 80

## 2020-08-25 DIAGNOSIS — R2689 Other abnormalities of gait and mobility: Secondary | ICD-10-CM

## 2020-08-25 DIAGNOSIS — M6281 Muscle weakness (generalized): Secondary | ICD-10-CM | POA: Diagnosis not present

## 2020-08-25 DIAGNOSIS — R42 Dizziness and giddiness: Secondary | ICD-10-CM | POA: Insufficient documentation

## 2020-08-25 DIAGNOSIS — R2681 Unsteadiness on feet: Secondary | ICD-10-CM | POA: Diagnosis not present

## 2020-08-25 NOTE — Patient Instructions (Signed)
Access Code: [email or web address] URL: [email or web address] Date: 08/25/2020 Prepared by: Cherly Anderson  Exercises Standing on foam eyes open - 2 x daily - 5 x weekly - 1 sets - 2 reps - 30 sec hold Standing head turns - 2 x daily - 5 x weekly - 2 sets - 10 reps Romberg Stance with Head Nods - 2 x daily - 5 x weekly - 2 sets - 10 reps Standing Marching - 2 x daily - 5 x weekly - 2 sets - 10 reps

## 2020-08-25 NOTE — Therapy (Signed)
[facility] [address] [place], [place], [address] Phone: [phone]   Fax:  [phone]  Physical Therapy Treatment  Patient Details  Name: Mary Mercado MRN: [id number] Date of Birth: 19-Apr-1941 Referring Provider (PT): [name]   Encounter Date: 08/25/2020   PT End of Session - 08/25/20 1019    Visit Number 3    Number of Visits 17    Date for PT Re-Evaluation 11/16/20   60 day poc, 90 day cert   Authorization Type Medicare so 10th visit progress note    PT Start Time 1018    PT Stop Time 1059    PT Time Calculation (min) 41 min    Equipment Utilized During Treatment Gait belt    Activity Tolerance Patient tolerated treatment well    Behavior During Therapy WFL for tasks assessed/performed           Past Medical History:  Diagnosis Date  . Allergy   . CVA (cerebral vascular accident) ([place]) 05/2018, 01/09/20  . Hyperlipidemia LDL goal <70   . Hypertension   . Palpitations   . Vertigo, benign positional     Past Surgical History:  Procedure Laterality Date  . AV NODE ABLATION N/A 03/30/2020   Procedure: AV NODE ABLATION;  Surgeon: Deboraha Sprang, MD;  Location: [facility];  Service: Cardiovascular;  Laterality: N/A;  . AV NODE ABLATION N/A 03/30/2020   Procedure: AV NODE ABLATION;  Surgeon: Deboraha Sprang, MD;  Location: [facility];  Service: Cardiovascular;  Laterality: N/A;  . CARDIOVERSION Left 10/31/2018   Procedure: CARDIOVERSION;  Surgeon: Thayer Headings, MD;  Location: [facility];  Service: Cardiovascular;  Laterality: Left;  . CARDIOVERSION N/A 01/08/2020   Procedure: CARDIOVERSION;  Surgeon: [name], MD;  Location: [facility] ENDOSCOPY;  Service: Cardiovascular;  Laterality: N/A;  . Hemilaminectomy and microdiskectomy at L4-5 on the left.  12/10/2003  . PACEMAKER IMPLANT N/A 03/30/2020   Procedure: PACEMAKER IMPLANT;  Surgeon: Deboraha Sprang, MD;   Location: [facility];  Service: Cardiovascular;  Laterality: N/A;  . TEE WITHOUT CARDIOVERSION N/A 06/05/2018   Procedure: TRANSESOPHAGEAL ECHOCARDIOGRAM (TEE);  Surgeon: Lelon Perla, MD;  Location: [facility] ENDOSCOPY;  Service: Cardiovascular;  Laterality: N/A;  loop    Vitals:   08/25/20 1022  BP: 118/78  Pulse: 80     Subjective Assessment - 08/25/20 1020    Subjective Patient reports she is doing well. She was a little tired. Reports she was also a little sore after getting down on knees to purge a file cabinet.    Patient is accompained by: Family member    Pertinent History CAD, hx of CVA, PAF s/p DCCV, BPPV, a-fib, HLD    Patient Stated Goals Pt would like to be able to live normally with moving and walking easier.    Currently in Pain? No/denies                             [facility] Adult PT Treatment/Exercise - 08/25/20 1026      Ambulation/Gait   Ambulation/Gait Yes    Ambulation/Gait Assistance 5: Supervision    Ambulation/Gait Assistance Details Verbal cues to try to increase step length and relax arms.    Ambulation Distance (Feet) 460 Feet    Assistive device None    Gait Pattern Step-through pattern;Decreased step length - right;Decreased step length - left;Decreased arm swing -  right;Decreased arm swing - left    Ambulation Surface Level;Indoor      Neuro Re-ed    Neuro Re-ed Details  Weaving in and out of 5 cones and then marching over blue mat x 4 bouts total. Verbal cues to try to slow her march and increase step length when weaving CGA. Tapping  cone with both feet then stepping over x 10 CGA. Corner balance on pillow with chair in front: feet together x 30 sec eyes open, head turns left/right x 10, up/down x 10, marching in place with fingertip support x 10. Pt reported feeling better with head turns today with less sway.                  PT Education - 08/25/20 1843    Education Details Started balance HEP with corner exercises.     Person(s) Educated Patient;Spouse    Methods Explanation;Demonstration;Handout    Comprehension Verbalized understanding;Returned demonstration            PT Short Term Goals - 08/19/20 0909      PT SHORT TERM GOAL #1   Title Pt will decrease TUG from 14.67 sec to <12 sec for improved balance and functional mobility.    Baseline 08/18/20 14.67 sec    Time 4    Period Weeks    Status New    Target Date 09/18/20      PT SHORT TERM GOAL #2   Title Pt will increase FGA from 16 to >19/30 for improved balance and gait safety.    Baseline 08/18/20 16/30    Time 4    Period Weeks    Status New    Target Date 09/18/20      PT SHORT TERM GOAL #3   Title Pt will ambulate >500' without AD supervision on level surfaces.    Time 4    Period Weeks    Status New    Target Date 09/18/20             PT Long Term Goals - 08/19/20 0912      PT LONG TERM GOAL #1   Title Pt will be independent with progressive HEP for strength, balance and functional mobility to continue gains on own.    Time 8    Period Weeks    Status New    Target Date 10/18/20      PT LONG TERM GOAL #2   Title Pt will increase gait speed to >0.70m/s without AD for improved gait safety.    Baseline 08/18/20 0.31m/s without AD    Time 8    Period Weeks    Status New    Target Date 10/18/20      PT LONG TERM GOAL #3   Title Pt will ambulate >800' on varied surfaces with LRAD mod I for improved community mobility.    Time 8    Period Weeks    Status New    Target Date 10/18/20      PT LONG TERM GOAL #4   Title Pt will increase FGA from 16 to >22/30 for improved balance and gait safety.    Baseline 08/18/20 16/30    Time 8    Period Weeks    Status New    Target Date 10/18/20                 Plan - 08/25/20 1843    Clinical Impression Statement PT continued to work on gait without AD. She  needs cues to try to increase step length. Able to increase distance today. Continued to focus on balance  training with pt being challenged with increased SLS time. Did better with head turns on compliant surface today.    Personal Factors and Comorbidities Age;Comorbidity 3+    Comorbidities CAD, hx of CVA, PAF s/p DCCV, BPPV, a-fib, HLD, seizure    Examination-Activity Limitations Bend;Bathing;Carry;Stand;Squat;Stairs;Toileting;Dressing;Transfers;Locomotion Level    Examination-Participation Restrictions Church;Yard Work;Driving;Community Activity;Laundry;Meal Prep;Cleaning    Stability/Clinical Decision Making Evolving/Moderate complexity    Rehab Potential Good    PT Frequency 2x / week    PT Duration 8 weeks    PT Treatment/Interventions ADLs/Self Care Home Management;Aquatic Therapy;Biofeedback;Canalith Repostioning;Balance training;Therapeutic exercise;Manual techniques;Vestibular;Therapeutic activities;Functional mobility training;Stair training;Gait training;Patient/family education;DME Instruction;Neuromuscular re-education    PT Next Visit Plan How did initial corner balance exercises go? Monitor BP. Has pacemaker set at 107. Continue gait w/o AD working on increasing step length, high level balance, functional strengthening/endurance.    Consulted and Agree with Plan of Care Patient;Family member/caregiver    Family Member Consulted pt's husband: [name]           Patient will benefit from skilled therapeutic intervention in order to improve the following deficits and impairments:  Abnormal gait,Decreased endurance,Impaired sensation,Cardiopulmonary status limiting activity,Decreased knowledge of use of DME,Decreased activity tolerance,Decreased strength,Decreased balance,Decreased mobility,Dizziness,Impaired flexibility  Visit Diagnosis: Other abnormalities of gait and mobility  Unsteadiness on feet  Muscle weakness (generalized)     Problem List Patient Active Problem List   Diagnosis Date Noted  . A-fib ([place]) 07/06/2020  . Pacemaker - MDT 07/06/2020  . New onset seizure  ([place]) 06/28/2020  . 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  . Hemiplegia and hemiparesis following cerebral infarction affecting left non-dominant side ([facility]) 02/05/2020  . 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 CVA (cerebral vascular accident) ([place]) 01/09/2020  . Dizziness 11/30/2018  . Paroxysmal atrial fibrillation ([place]) 08/13/2018  . Prediabetes 06/15/2018  . Palpitations 06/05/2018  . Essential hypertension 06/05/2018  . Hyperlipidemia LDL goal <70 06/05/2018  . Acute ischemic right MCA stroke ([place]) 06/05/2018  . Stroke (cerebrum) ([place]) - R MCA s/p tPA, embolic, source unknown 33/54/5625  . SVT (supraventricular tachycardia) ([place]) 05/28/2015  . PVC's (premature ventricular contractions) 05/28/2015  . BENIGN POSITIONAL VERTIGO 06/16/2009  . ALLERGIC RHINITIS 06/16/2009    Electa Sniff, PT, DPT, NCS 08/25/2020, 6:46 PM  [facility] [address] [place], [place], [address] Phone: [phone]   Fax:  [phone]  Name: Mary Mercado MRN: [id number] Date of Birth: 11-15-1940

## 2020-08-28 ENCOUNTER — Ambulatory Visit: Payer: Medicare Other

## 2020-08-28 ENCOUNTER — Other Ambulatory Visit: Payer: Self-pay

## 2020-08-28 DIAGNOSIS — R42 Dizziness and giddiness: Secondary | ICD-10-CM | POA: Diagnosis not present

## 2020-08-28 DIAGNOSIS — M6281 Muscle weakness (generalized): Secondary | ICD-10-CM | POA: Diagnosis not present

## 2020-08-28 DIAGNOSIS — R2689 Other abnormalities of gait and mobility: Secondary | ICD-10-CM | POA: Diagnosis not present

## 2020-08-28 DIAGNOSIS — R2681 Unsteadiness on feet: Secondary | ICD-10-CM | POA: Diagnosis not present

## 2020-08-28 NOTE — Therapy (Signed)
[facility] [address] [place], [place], [address] Phone: [phone]   Fax:  [phone]  Physical Therapy Treatment  Patient Details  Name: Mary Mercado MRN: [id number] Date of Birth: [birth year] Referring Provider (PT): [name]   Encounter Date: 08/28/2020   PT End of Session - 08/28/20 1104    Visit Number 4    Number of Visits 17    Date for PT Re-Evaluation 11/16/20   60 day poc, 90 day cert   Authorization Type Medicare so 10th visit progress note    PT Start Time 1102    PT Stop Time 1141    PT Time Calculation (min) 39 min    Equipment Utilized During Treatment Gait belt    Activity Tolerance Patient tolerated treatment well    Behavior During Therapy [facility] for tasks assessed/performed           Past Medical History:  Diagnosis Date  . Allergy   . CVA (cerebral vascular accident) ([place]) 05/2018, 01/09/20  . Hyperlipidemia LDL goal <70   . Hypertension   . Palpitations   . Vertigo, benign positional     Past Surgical History:  Procedure Laterality Date  . AV NODE ABLATION N/A 03/30/2020   Procedure: AV NODE ABLATION;  Surgeon: Deboraha Sprang, MD;  Location: [facility];  Service: Cardiovascular;  Laterality: N/A;  . AV NODE ABLATION N/A 03/30/2020   Procedure: AV NODE ABLATION;  Surgeon: Deboraha Sprang, MD;  Location: [facility];  Service: Cardiovascular;  Laterality: N/A;  . CARDIOVERSION Left 10/31/2018   Procedure: CARDIOVERSION;  Surgeon: Thayer Headings, MD;  Location: [facility];  Service: Cardiovascular;  Laterality: Left;  . CARDIOVERSION N/A 01/08/2020   Procedure: CARDIOVERSION;  Surgeon: [name], MD;  Location: [facility] ENDOSCOPY;  Service: Cardiovascular;  Laterality: N/A;  . Hemilaminectomy and microdiskectomy at L4-5 on the left.  12/10/2003  . PACEMAKER IMPLANT N/A 03/30/2020   Procedure: PACEMAKER IMPLANT;  Surgeon: Deboraha Sprang, MD;   Location: [facility];  Service: Cardiovascular;  Laterality: N/A;  . TEE WITHOUT CARDIOVERSION N/A 06/05/2018   Procedure: TRANSESOPHAGEAL ECHOCARDIOGRAM (TEE);  Surgeon: Lelon Perla, MD;  Location: [facility] ENDOSCOPY;  Service: Cardiovascular;  Laterality: N/A;  loop    There were no vitals filed for this visit.   Subjective Assessment - 08/28/20 1105    Subjective Patient reports that she was able to try the corner balance exercises. Reports that they went ok but she got dizzy when turning head too fast so had to slow down.    Patient is accompained by: Family member    Pertinent History CAD, hx of CVA, PAF s/p DCCV, BPPV, a-fib, HLD    Patient Stated Goals Pt would like to be able to live normally with moving and walking easier.    Currently in Pain? No/denies                             [facility] Adult PT Treatment/Exercise - 08/28/20 1106      Ambulation/Gait   Ambulation/Gait Yes    Ambulation/Gait Assistance 5: Supervision    Ambulation/Gait Assistance Details Verbal cues to try to increase step length and relax arms for more arm swing. Pt tends to list to the right some. Pt reported being a little winded towards end but was able to carry on a conversation throughout. 8/10 RPE. Feels better pretty  quick when sitting and taking a drink.    Ambulation Distance (Feet) 575 Feet    Assistive device None    Gait Pattern Step-through pattern;Decreased arm swing - right;Decreased arm swing - left;Decreased step length - right;Decreased step length - left    Ambulation Surface Level;Indoor      Neuro Re-ed    Neuro Re-ed Details  Standing on airex at mat: sit to stand x 10 without UE support getting balance each time, Standing on airex feet together x 30 sec eyes open, 30 sec eyes closed, head turns left/right x 10, up/down x 10. Standing on airex with feet apart tossing 14 bean bags in basket with squatting down to pick up of stool first. Pt ambulated with 180 degree  turns on command x 2 min. Pt slows significantly with turning. Denied any dizziness but feels she goes slow to prevent dizziness and also feeling tired from prior exercises. Discussed finding focus point when she turns. Close SBA/CGA throughout.                    PT Short Term Goals - 08/19/20 0909      PT SHORT TERM GOAL #1   Title Pt will decrease TUG from 14.67 sec to <12 sec for improved balance and functional mobility.    Baseline 08/18/20 14.67 sec    Time 4    Period Weeks    Status New    Target Date 09/18/20      PT SHORT TERM GOAL #2   Title Pt will increase FGA from 16 to >19/30 for improved balance and gait safety.    Baseline 08/18/20 16/30    Time 4    Period Weeks    Status New    Target Date 09/18/20      PT SHORT TERM GOAL #3   Title Pt will ambulate >500' without AD supervision on level surfaces.    Time 4    Period Weeks    Status New    Target Date 09/18/20             PT Long Term Goals - 08/19/20 0912      PT LONG TERM GOAL #1   Title Pt will be independent with progressive HEP for strength, balance and functional mobility to continue gains on own.    Time 8    Period Weeks    Status New    Target Date 10/18/20      PT LONG TERM GOAL #2   Title Pt will increase gait speed to >0.65m/s without AD for improved gait safety.    Baseline 08/18/20 0.61m/s without AD    Time 8    Period Weeks    Status New    Target Date 10/18/20      PT LONG TERM GOAL #3   Title Pt will ambulate >800' on varied surfaces with LRAD mod I for improved community mobility.    Time 8    Period Weeks    Status New    Target Date 10/18/20      PT LONG TERM GOAL #4   Title Pt will increase FGA from 16 to >22/30 for improved balance and gait safety.    Baseline 08/18/20 16/30    Time 8    Period Weeks    Status New    Target Date 10/18/20                 Plan - 08/28/20 1617  Clinical Impression Statement Pt again able to increase gait distance  without AD. She was tired at end of session. Continued to focus on standing balance on compliant surface and turning with gait. Pt is very slow with turning reporting that she doesn't want to get dizzy.    Personal Factors and Comorbidities Age;Comorbidity 3+    Comorbidities CAD, hx of CVA, PAF s/p DCCV, BPPV, a-fib, HLD, seizure    Examination-Activity Limitations Bend;Bathing;Carry;Stand;Squat;Stairs;Toileting;Dressing;Transfers;Locomotion Level    Examination-Participation Restrictions Church;Yard Work;Driving;Community Activity;Laundry;Meal Prep;Cleaning    Stability/Clinical Decision Making Evolving/Moderate complexity    Rehab Potential Good    PT Frequency 2x / week    PT Duration 8 weeks    PT Treatment/Interventions ADLs/Self Care Home Management;Aquatic Therapy;Biofeedback;Canalith Repostioning;Balance training;Therapeutic exercise;Manual techniques;Vestibular;Therapeutic activities;Functional mobility training;Stair training;Gait training;Patient/family education;DME Instruction;Neuromuscular re-education    PT Next Visit Plan Monitor BP. Has pacemaker set at 24. Continue gait w/o AD working on increasing step length, high level balance, functional strengthening/endurance. Pt reporting that turning her head too fast makes her dizzy which is why she is slowing down. Can we assess this a little more next session?    Consulted and Agree with Plan of Care Patient;Family member/caregiver    Family Member Consulted pt's husband: [name]           Patient will benefit from skilled therapeutic intervention in order to improve the following deficits and impairments:  Abnormal gait,Decreased endurance,Impaired sensation,Cardiopulmonary status limiting activity,Decreased knowledge of use of DME,Decreased activity tolerance,Decreased strength,Decreased balance,Decreased mobility,Dizziness,Impaired flexibility  Visit Diagnosis: Other abnormalities of gait and mobility  Muscle weakness  (generalized)  Unsteadiness on feet     Problem List Patient Active Problem List   Diagnosis Date Noted  . A-fib ([place]) 07/06/2020  . Pacemaker - MDT 07/06/2020  . New onset seizure ([place]) 06/28/2020  . 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  . Hemiplegia and hemiparesis following cerebral infarction affecting left non-dominant side ([place]) 02/05/2020  . 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 CVA (cerebral vascular accident) ([place]) 01/09/2020  . Dizziness 11/30/2018  . Paroxysmal atrial fibrillation ([place]) 08/13/2018  . Prediabetes 06/15/2018  . Palpitations 06/05/2018  . Essential hypertension 06/05/2018  . Hyperlipidemia LDL goal <70 06/05/2018  . Acute ischemic right MCA stroke ([place]) 06/05/2018  . Stroke (cerebrum) ([place]) - R MCA s/p tPA, embolic, source unknown 49/44/9675  . SVT (supraventricular tachycardia) ([place]) 05/28/2015  . PVC's (premature ventricular contractions) 05/28/2015  . BENIGN POSITIONAL VERTIGO 06/16/2009  . ALLERGIC RHINITIS 06/16/2009    Electa Sniff, PT, DPT, NCS 08/28/2020, 4:20 PM  [facility] [address] [place], [place], [address] Phone: [phone]   Fax:  [phone]  Name: Mary Mercado MRN: [id number] Date of Birth: December 28, 1940

## 2020-09-01 ENCOUNTER — Other Ambulatory Visit: Payer: Self-pay

## 2020-09-01 ENCOUNTER — Ambulatory Visit: Payer: Medicare Other

## 2020-09-01 ENCOUNTER — Encounter: Payer: Self-pay | Admitting: Cardiovascular Disease

## 2020-09-01 DIAGNOSIS — R2689 Other abnormalities of gait and mobility: Secondary | ICD-10-CM | POA: Diagnosis not present

## 2020-09-01 DIAGNOSIS — R42 Dizziness and giddiness: Secondary | ICD-10-CM | POA: Diagnosis not present

## 2020-09-01 DIAGNOSIS — R2681 Unsteadiness on feet: Secondary | ICD-10-CM | POA: Diagnosis not present

## 2020-09-01 DIAGNOSIS — M6281 Muscle weakness (generalized): Secondary | ICD-10-CM | POA: Diagnosis not present

## 2020-09-01 NOTE — Therapy (Signed)
[facility] [address] [place], [place], [address] Phone: [phone]   Fax:  [phone]  Physical Therapy Treatment  Patient Details  Name: Mary Mercado MRN: [id number] Date of Birth: 11-02-1940 Referring Provider (PT): [name]   Encounter Date: 09/01/2020   PT End of Session - 09/01/20 1102    Visit Number 5    Number of Visits 17    Date for PT Re-Evaluation 11/16/20   60 day poc, 90 day cert   Authorization Type Medicare so 10th visit progress note    PT Start Time 1102    PT Stop Time 1145    PT Time Calculation (min) 43 min    Equipment Utilized During Treatment Gait belt    Activity Tolerance Patient tolerated treatment well    Behavior During Therapy WFL for tasks assessed/performed           Past Medical History:  Diagnosis Date  . Allergy   . CVA (cerebral vascular accident) ([place]) 05/2018, 01/09/20  . Hyperlipidemia LDL goal <70   . Hypertension   . Palpitations   . Vertigo, benign positional     Past Surgical History:  Procedure Laterality Date  . AV NODE ABLATION N/A 03/30/2020   Procedure: AV NODE ABLATION;  Surgeon: Deboraha Sprang, MD;  Location: [facility];  Service: Cardiovascular;  Laterality: N/A;  . AV NODE ABLATION N/A 03/30/2020   Procedure: AV NODE ABLATION;  Surgeon: Deboraha Sprang, MD;  Location: [facility];  Service: Cardiovascular;  Laterality: N/A;  . CARDIOVERSION Left 10/31/2018   Procedure: CARDIOVERSION;  Surgeon: Thayer Headings, MD;  Location: [facility];  Service: Cardiovascular;  Laterality: Left;  . CARDIOVERSION N/A 01/08/2020   Procedure: CARDIOVERSION;  Surgeon: [name], MD;  Location: [facility] ENDOSCOPY;  Service: Cardiovascular;  Laterality: N/A;  . Hemilaminectomy and microdiskectomy at L4-5 on the left.  12/10/2003  . PACEMAKER IMPLANT N/A 03/30/2020   Procedure: PACEMAKER IMPLANT;  Surgeon: Deboraha Sprang, MD;   Location: [facility];  Service: Cardiovascular;  Laterality: N/A;  . TEE WITHOUT CARDIOVERSION N/A 06/05/2018   Procedure: TRANSESOPHAGEAL ECHOCARDIOGRAM (TEE);  Surgeon: Lelon Perla, MD;  Location: [facility] ENDOSCOPY;  Service: Cardiovascular;  Laterality: N/A;  loop    There were no vitals filed for this visit.   Subjective Assessment - 09/01/20 1105    Subjective Patient reports that she is having the dizziness with quick head movements. No falls. No pain. No other new changes/complaints.    Patient is accompained by: Family member    Pertinent History CAD, hx of CVA, PAF s/p DCCV, BPPV, a-fib, HLD    Patient Stated Goals Pt would like to be able to live normally with moving and walking easier.    Currently in Pain? No/denies                   Vestibular Assessment - 09/01/20 0001      Oculomotor Exam   Oculomotor Alignment Normal    Ocular ROM WFL    Spontaneous Absent    Gaze-induced  Left beating nystagmus with L gaze    Smooth Pursuits Saccades    Saccades Slow;Undershoots      Oculomotor Exam-Fixation Suppressed    Left Head Impulse Positive    Right Head Impulse Negative      Vestibulo-Ocular Reflex   VOR 1 Head Only (x 1 viewing) difficulty maintaining gaze; moderate dizziness after completion  VOR Cancellation Normal                 [facility] Adult PT Treatment/Exercise - 09/01/20 0001      Transfers   Transfers Sit to Stand;Stand to Sit    Sit to Stand 5: Supervision    Stand to Sit 5: Supervision      Ambulation/Gait   Ambulation/Gait Yes    Ambulation/Gait Assistance 5: Supervision    Ambulation/Gait Assistance Details completed ambulation x 300 ft without AD, continued cues for step length and improved arm swing. Moderate SOB reported after, vitals WNL.    Ambulation Distance (Feet) 300 Feet    Assistive device None    Gait Pattern Step-through pattern;Decreased arm swing - right;Decreased arm swing - left;Decreased step length -  right;Decreased step length - left    Ambulation Surface Level;Indoor      High Level Balance   High Level Balance Activities Head turns   fast/slow walking   High Level Balance Comments Completed ambulation with head turns and hcanges in gait speed (fast/slow), no imbalance noted with gait speed change, but increased unsteadiness with horizontal/vertical head turn. Often veering to direction of head turn.      Neuro Re-ed    Neuro Re-ed Details  Completed ambulation with step over cone with toe tap prior to stepping to further promote SLS, completed with 3 cones x 3 laps down and back. Verbal cues to try to maintain pace with completion. Then progressed to completing toe tap bilaterally followed by step over for further balance challenge. Completed x 2 laps down and back, rest break required after completion due to fatigue.           Vestibular Treatment/Exercise - 09/01/20 0001      Vestibular Treatment/Exercise   Vestibular Treatment Provided Gaze    Gaze Exercises X1 Viewing Horizontal;X1 Viewing Vertical      X1 Viewing Horizontal   Foot Position seated    Reps 2    Comments x 30 seconds, cues for technique. Increased dizziness (moderate)      X1 Viewing Vertical   Foot Position seated    Reps 2    Comments x 30 seconds, cues for technique. mild dizziness            Gaze Stabilization: Sitting    Keeping eyes on target on wall 3-4 feet away, tilt head down 15-30 and move head side to side for 30 seconds. Repeat while moving head up and down for 30 seconds. Do 2 sessions per day.   Gaze Stabilization: Tip Card  1.Target must remain in focus, not blurry, and appear stationary while head is in motion. 2.Perform exercises with small head movements (45 to either side of midline). 3.Increase speed of head motion so long as target is in focus. 4.If you wear eyeglasses, be sure you can see target through lens (therapist will give specific instructions for bifocal /  progressive lenses). 5.These exercises may provoke dizziness or nausea. Work through these symptoms. If too dizzy, slow head movement slightly. Rest between each exercise. 6.Exercises demand concentration; avoid distractions. 7.For safety, perform standing exercises close to a counter, wall, corner, or next to someone.  Copyright  VHI. All rights reserved.       PT Education - 09/01/20 1123    Education Details Addition of VOR x 1    Person(s) Educated Patient;Spouse    Methods Explanation;Handout;Demonstration    Comprehension Verbalized understanding;Returned demonstration;Verbal cues required  PT Short Term Goals - 08/19/20 0909      PT SHORT TERM GOAL #1   Title Pt will decrease TUG from 14.67 sec to <12 sec for improved balance and functional mobility.    Baseline 08/18/20 14.67 sec    Time 4    Period Weeks    Status New    Target Date 09/18/20      PT SHORT TERM GOAL #2   Title Pt will increase FGA from 16 to >19/30 for improved balance and gait safety.    Baseline 08/18/20 16/30    Time 4    Period Weeks    Status New    Target Date 09/18/20      PT SHORT TERM GOAL #3   Title Pt will ambulate >500' without AD supervision on level surfaces.    Time 4    Period Weeks    Status New    Target Date 09/18/20             PT Long Term Goals - 08/19/20 0912      PT LONG TERM GOAL #1   Title Pt will be independent with progressive HEP for strength, balance and functional mobility to continue gains on own.    Time 8    Period Weeks    Status New    Target Date 10/18/20      PT LONG TERM GOAL #2   Title Pt will increase gait speed to >0.38m/s without AD for improved gait safety.    Baseline 08/18/20 0.19m/s without AD    Time 8    Period Weeks    Status New    Target Date 10/18/20      PT LONG TERM GOAL #3   Title Pt will ambulate >800' on varied surfaces with LRAD mod I for improved community mobility.    Time 8    Period Weeks    Status New     Target Date 10/18/20      PT LONG TERM GOAL #4   Title Pt will increase FGA from 16 to >22/30 for improved balance and gait safety.    Baseline 08/18/20 16/30    Time 8    Period Weeks    Status New    Target Date 10/18/20                 Plan - 09/01/20 1200    Clinical Impression Statement Completed vestibular assesment with demonstrating abnormal oculomotor exam, Positive Left head Impulse, and increased motion sensitivity to vertical/horizontal head turns. Re-initiated seated VOR x 1, that was established at prior POC due to impaired VOR. intermittent rest breaks required due to dizziness. Continued high level balance with focus on SLS and improved dynamic gait. Will continue to progress toward all LTGs.    Personal Factors and Comorbidities Age;Comorbidity 3+    Comorbidities CAD, hx of CVA, PAF s/p DCCV, BPPV, a-fib, HLD, seizure    Examination-Activity Limitations Bend;Bathing;Carry;Stand;Squat;Stairs;Toileting;Dressing;Transfers;Locomotion Level    Examination-Participation Restrictions Church;Yard Work;Driving;Community Activity;Laundry;Meal Prep;Cleaning    Stability/Clinical Decision Making Evolving/Moderate complexity    Rehab Potential Good    PT Frequency 2x / week    PT Duration 8 weeks    PT Treatment/Interventions ADLs/Self Care Home Management;Aquatic Therapy;Biofeedback;Canalith Repostioning;Balance training;Therapeutic exercise;Manual techniques;Vestibular;Therapeutic activities;Functional mobility training;Stair training;Gait training;Patient/family education;DME Instruction;Neuromuscular re-education    PT Next Visit Plan Monitor BP. Has pacemaker set at 5. Continue gait w/o AD working on increasing step length, high level balance, functional strengthening/endurance. Continue VOR  x 1. Habituation to Head Turns/Nods.    Consulted and Agree with Plan of Care Patient;Family member/caregiver    Family Member Consulted pt's husband: [name]           Patient  will benefit from skilled therapeutic intervention in order to improve the following deficits and impairments:  Abnormal gait,Decreased endurance,Impaired sensation,Cardiopulmonary status limiting activity,Decreased knowledge of use of DME,Decreased activity tolerance,Decreased strength,Decreased balance,Decreased mobility,Dizziness,Impaired flexibility  Visit Diagnosis: Other abnormalities of gait and mobility  Muscle weakness (generalized)  Unsteadiness on feet  Dizziness and giddiness     Problem List Patient Active Problem List   Diagnosis Date Noted  . A-fib ([place]) 07/06/2020  . Pacemaker - MDT 07/06/2020  . New onset seizure ([place]) 06/28/2020  . 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  . Hemiplegia and hemiparesis following cerebral infarction affecting left non-dominant side ([place]) 02/05/2020  . 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 CVA (cerebral vascular accident) ([place]) 01/09/2020  . Dizziness 11/30/2018  . Paroxysmal atrial fibrillation ([place]) 08/13/2018  . Prediabetes 06/15/2018  . Palpitations 06/05/2018  . Essential hypertension 06/05/2018  . Hyperlipidemia LDL goal <70 06/05/2018  . Acute ischemic right MCA stroke ([place]) 06/05/2018  . Stroke (cerebrum) ([place]) - R MCA s/p tPA, embolic, source unknown [id number]  . SVT (supraventricular tachycardia) ([place]) 05/28/2015  . PVC's (premature ventricular contractions) 05/28/2015  . BENIGN POSITIONAL VERTIGO 06/16/2009  . ALLERGIC RHINITIS 06/16/2009    Jones Bales, PT, DPT 09/01/2020, 12:03 PM  [facility] [address] [place], [place], [address] Phone: [phone]   Fax:  [phone]  Name: Mari Mcdow MRN: [id number] Date of Birth: [birth year]

## 2020-09-01 NOTE — Progress Notes (Signed)
Cardiology Office Note:    Date:  09/02/2020   ID:  [name], [place] 1940/05/04, MRN [id number]  PCP:  [name], MD  Cardiologist:  Mertie Moores, MD  Electrophysiologist:  None   Referring MD: [name], MD    Problem List 1. Atrial Fib:    CHADS2VASC =  5   ( female, age 80, CVA Chief Complaint  Patient presents with  . Atrial Fibrillation    Aug. 7, 2020     Mary Mercado is a 80 y.o. female with a hx of rapid atrial fib.    We cardioverted her on July 7. Is doing great.   Is having some dizziness.  Constant.  Not related to orthostatic.  Is not dizzy when she is lying down      Thinks its due to the metoprolol   Nov. 9, 2020  [name] is seen today for follow up of her atrial fib Doing well No cp Breathing is ok Has some left lower rib soreness.  Has lots of dizziness.   Especially after starting the telmisartan .    Reviewed her BP log  No readings below 100 .   Most blood pressure readings since starting the telmisartan are in the normal range.  Her blood pressure was mildly elevated before starting telmisartan.  August 09, 2019: [name] is seen today for follow-up of her atrial fibrillation.  She had a successful cardioversion July, 2020.  She remains in normal sinus rhythm. Dizziness has improved.   Having back pain - is on ultram   Has the sensation of quivering inside - she does not think its a HR irregularity   Sept. 9, 2021:   Seen for follow up . Has PAF - is in atrial fib today  Has had palpitations for the past several weeks.  Has been on eliquis without interruption.  Tried some Dilt 30 mg tabks Having some chest twinges  -  Oct. 15, 2021: Pt has hx of persistent AF. Was cardioverted Sept. 15. Was admitted with CVA on Sept. 16.  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 She had some tachycardia on Oct. 9.  Called the weekend on call PA .  Almyra Deforest, [place] recommended additional Diltiazem. ( she would later be diagnosed with a UTI which may have contributed to her tachycardia )  Eliquis had been switched to Pradaxa.  She saw her Primary MD ( Dr. Sarajane Jews) on Oct. 13.  He increased the Diltiazem to 180 mg a day   We discussed afib clinic to consider alternatives antiarrhythmics and also to consider Afib ablation .  We discussed doing a TEE prior to any other cardioversion attempts.   Sep 02, 2020  Has persistent Afib   Had a cardioversion - complicated by a CVA the following day  Has had AV node ablation with pacer placement  Had a seizure on March .  Is now on Keppra     Past Medical History:  Diagnosis Date  . Allergy   . CVA (cerebral vascular accident) ([place]) 05/2018, 01/09/20  . Hyperlipidemia LDL goal <70   . Hypertension   . Palpitations   . Vertigo, benign positional     Past Surgical History:  Procedure Laterality Date  . AV NODE ABLATION N/A 03/30/2020   Procedure: AV NODE ABLATION;  Surgeon: Deboraha Sprang, MD;  Location: [facility];  Service: Cardiovascular;  Laterality: N/A;  . AV NODE ABLATION N/A 03/30/2020   Procedure: AV NODE ABLATION;  Surgeon: Deboraha Sprang, MD;  Location: [facility];  Service: Cardiovascular;  Laterality: N/A;  . CARDIOVERSION Left 10/31/2018   Procedure: CARDIOVERSION;  Surgeon: Thayer Headings, MD;  Location: [facility];  Service: Cardiovascular;  Laterality: Left;  . CARDIOVERSION N/A 01/08/2020   Procedure: CARDIOVERSION;  Surgeon: [name], MD;  Location[facility] ENDOSCOPY;  Service: Cardiovascular;  Laterality: N/A;  . Hemilaminectomy and microdiskectomy at L4-5 on the left.  12/10/2003  . PACEMAKER IMPLANT N/A 03/30/2020   Procedure: PACEMAKER IMPLANT;  Surgeon: Deboraha Sprang, MD;  Location: [facility];  Service: Cardiovascular;  Laterality: N/A;  . TEE WITHOUT CARDIOVERSION N/A 06/05/2018   Procedure: TRANSESOPHAGEAL ECHOCARDIOGRAM (TEE);  Surgeon: Lelon Perla, MD;  Location: [facility] ENDOSCOPY;  Service: Cardiovascular;  Laterality: N/A;  loop    Current Medications: Current Meds  Medication Sig  . atorvastatin (LIPITOR) 80 MG tablet TAKE 1 TABLET (80 MG TOTAL) BY MOUTH DAILY AT 6 PM.  . levETIRAcetam (KEPPRA) 500 MG tablet Take 1 tablet (500 mg total) by mouth 2 (two) times daily.  Marland Kitchen losartan (COZAAR) 50 MG tablet Take 1 tablet (50 mg total) by mouth daily.  . metoprolol tartrate (LOPRESSOR) 25 MG tablet Take 0.5 tablets (12.5 mg total) by mouth at bedtime.  Marland Kitchen PRADAXA 150 MG CAPS capsule TAKE 1 CAPSULE (150 MG TOTAL) BY MOUTH EVERY 12 (TWELVE) HOURS.     Allergies:   Duloxetine and Acetaminophen   Social History   Socioeconomic History  . Marital status: Married    Spouse name: [name]  . Number of children: Not on file  . Years of education: 12+  . Highest education level: Not on file  Occupational History    Comment: retired  Tobacco Use  . Smoking status: Never Smoker  . Smokeless tobacco: Never Used  Vaping Use  . Vaping Use: Never used  Substance and Sexual Activity  . Alcohol use: Never  . Drug use: Never  . Sexual activity: Not on file  Other Topics Concern  . Not on file  Social History Narrative   Lives with husband   caffeine none now   Social Determinants of Radio broadcast assistant Strain: Not on file  Food Insecurity: Not on file  Transportation Needs: Not on file  Physical Activity: Not on file  Stress: Not on file  Social Connections: Not on file     Family History: The patient's family history includes Colon cancer in her father; Diabetes in her mother; Heart attack in her mother; Heart attack (age of onset: 62) in her brother.  ROS:   Please see the history of present illness.     All other  systems reviewed and are negative.  EKGs/Labs/Other Studies Reviewed:    The following studies were reviewed today:  Recent Labs: 11/14/2019: TSH 1.48 06/28/2020: ALT 22 06/29/2020: Hemoglobin 13.4; Magnesium 1.8; Platelets 195 07/13/2020: BUN 11; Creatinine, Ser 0.71; Potassium 4.5; Sodium 140  Recent Lipid Panel    Component Value Date/Time   CHOL 135 06/29/2020 0323   TRIG 50 06/29/2020 0323   HDL 54 06/29/2020 0323   CHOLHDL 2.5 06/29/2020 0323   VLDL 10 06/29/2020 0323   LDLCALC 71  06/29/2020 0323   LDLCALC 90 11/14/2019 1102     Physical Exam: Blood pressure (!) 162/90, pulse 78, height 5\' 5"  (1.651 m), weight 126 lb (57.2 kg), SpO2 98 %.  GEN:  Well nourished, well developed in no acute distress HEENT: Normal NECK: No JVD; No carotid bruits LYMPHATICS: No lymphadenopathy CARDIAC: RRR , no murmurs, rubs, gallops RESPIRATORY:  Clear to auscultation without rales, wheezing or rhonchi  ABDOMEN: Soft, non-tender, non-distended MUSCULOSKELETAL:  No edema; No deformity  SKIN: Warm and dry NEUROLOGIC:  Alert and oriented x 3  EKG:        ASSESSMENT:    No diagnosis found. PLAN:      1.  Atrial fibrillation:   S/p AV node ablation  She is feeling well - much better after having the rate responsiveness disableed.    2.  Dizziness: getting rehab   3.   HTN:  BP readings at home are well controlled.   Cont current meds.     Medication Adjustments/Labs and Tests Ordered: Current medicines are reviewed at length with the patient today.  Concerns regarding medicines are outlined above.  No orders of the defined types were placed in this encounter.  No orders of the defined types were placed in this encounter.    Patient Instructions  Medication Instructions:  Your physician recommends that you continue on your current medications as directed. Please refer to the Current Medication list given to you today.  *If you need a refill on your cardiac medications  before your next appointment, please call your pharmacy*   Lab Work: none If you have labs (blood work) drawn today and your tests are completely normal, you will receive your results only by: Marland Kitchen MyChart Message (if you have MyChart) OR . A paper copy in the mail If you have any lab test that is abnormal or we need to change your treatment, we will call you to review the results.   Testing/Procedures: none   Follow-Up: At [facility], you and your health needs are our priority.  As part of our continuing mission to provide you with exceptional heart care, we have created designated Provider Care Teams.  These Care Teams include your primary Cardiologist (physician) and Advanced Practice Providers (APPs -  Physician Assistants and Nurse Practitioners) who all work together to provide you with the care you need, when you need it.    Your next appointment:   1 year(s)  The format for your next appointment:   In Person  Provider:   You may see Mertie Moores, MD or one of the following Advanced Practice Providers on your designated Care Team:    Richardson Dopp, PA-C  Robbie Lis, [place]       Signed, Mertie Moores, MD  09/02/2020 11:10 AM    [place]

## 2020-09-01 NOTE — Patient Instructions (Signed)
Gaze Stabilization: Sitting    Keeping eyes on target on wall 3-4 feet away, tilt head down 15-30 and move head side to side for 30 seconds. Repeat while moving head up and down for 30 seconds. Do 2 sessions per day.  Gaze Stabilization: Tip Card  1.Target must remain in focus, not blurry, and appear stationary while head is in motion. 2.Perform exercises with small head movements (45 to either side of midline). 3.Increase speed of head motion so long as target is in focus. 4.If you wear eyeglasses, be sure you can see target through lens (therapist will give specific instructions for bifocal / progressive lenses). 5.These exercises may provoke dizziness or nausea. Work through these symptoms. If too dizzy, slow head movement slightly. Rest between each exercise. 6.Exercises demand concentration; avoid distractions. 7.For safety, perform standing exercises close to a counter, wall, corner, or next to someone.  Copyright  VHI. All rights reserved.   

## 2020-09-02 ENCOUNTER — Ambulatory Visit (INDEPENDENT_AMBULATORY_CARE_PROVIDER_SITE_OTHER): Payer: Medicare Other | Admitting: Cardiovascular Disease

## 2020-09-02 ENCOUNTER — Other Ambulatory Visit: Payer: Self-pay | Admitting: Cardiovascular Disease

## 2020-09-02 ENCOUNTER — Encounter: Payer: Self-pay | Admitting: Cardiovascular Disease

## 2020-09-02 VITALS — BP 162/90 | HR 78 | Ht 65.0 in | Wt 126.0 lb

## 2020-09-02 DIAGNOSIS — I10 Essential (primary) hypertension: Secondary | ICD-10-CM

## 2020-09-02 DIAGNOSIS — I639 Cerebral infarction, unspecified: Secondary | ICD-10-CM | POA: Diagnosis not present

## 2020-09-02 DIAGNOSIS — I4811 Longstanding persistent atrial fibrillation: Secondary | ICD-10-CM

## 2020-09-02 DIAGNOSIS — I4819 Other persistent atrial fibrillation: Secondary | ICD-10-CM

## 2020-09-02 NOTE — Patient Instructions (Signed)
Medication Instructions:  Your physician recommends that you continue on your current medications as directed. Please refer to the Current Medication list given to you today.  *If you need a refill on your cardiac medications before your next appointment, please call your pharmacy*   Lab Work: none If you have labs (blood work) drawn today and your tests are completely normal, you will receive your results only by: . MyChart Message (if you have MyChart) OR . A paper copy in the mail If you have any lab test that is abnormal or we need to change your treatment, we will call you to review the results.   Testing/Procedures: none   Follow-Up: At [facility], you and your health needs are our priority.  As part of our continuing mission to provide you with exceptional heart care, we have created designated Provider Care Teams.  These Care Teams include your primary Cardiologist (physician) and Advanced Practice Providers (APPs -  Physician Assistants and Nurse Practitioners) who all work together to provide you with the care you need, when you need it.     Your next appointment:   1 year(s)  The format for your next appointment:   In Person  Provider:   You may see Philip Nahser, MD or one of the following Advanced Practice Providers on your designated Care Team:    Scott Weaver, PA-C  Vin Bhagat, PA-C   

## 2020-09-04 ENCOUNTER — Ambulatory Visit: Payer: Medicare Other

## 2020-09-04 ENCOUNTER — Other Ambulatory Visit: Payer: Self-pay

## 2020-09-04 VITALS — BP 121/84 | HR 80

## 2020-09-04 DIAGNOSIS — R42 Dizziness and giddiness: Secondary | ICD-10-CM

## 2020-09-04 DIAGNOSIS — M6281 Muscle weakness (generalized): Secondary | ICD-10-CM | POA: Diagnosis not present

## 2020-09-04 DIAGNOSIS — R2681 Unsteadiness on feet: Secondary | ICD-10-CM | POA: Diagnosis not present

## 2020-09-04 DIAGNOSIS — R2689 Other abnormalities of gait and mobility: Secondary | ICD-10-CM | POA: Diagnosis not present

## 2020-09-04 NOTE — Therapy (Signed)
[facility] [address] [place], [place], [address] Phone: [phone]   Fax:  [phone]  Physical Therapy Treatment  Patient Details  Name: Mary Mercado MRN: [id number] Date of Birth: 1940/11/10 Referring Provider (PT): [name]   Encounter Date: 09/04/2020   PT End of Session - 09/04/20 1136    Visit Number 6    Number of Visits 17    Date for PT Re-Evaluation 11/16/20   60 day poc, 90 day cert   Authorization Type Medicare so 10th visit progress note    PT Start Time 1136    PT Stop Time 1222    PT Time Calculation (min) 46 min    Equipment Utilized During Treatment Gait belt    Activity Tolerance Patient tolerated treatment well    Behavior During Therapy [facility] for tasks assessed/performed           Past Medical History:  Diagnosis Date  . Allergy   . CVA (cerebral vascular accident) ([place]) 05/2018, 01/09/20  . Hyperlipidemia LDL goal <70   . Hypertension   . Palpitations   . Vertigo, benign positional     Past Surgical History:  Procedure Laterality Date  . AV NODE ABLATION N/A 03/30/2020   Procedure: AV NODE ABLATION;  Surgeon: Deboraha Sprang, MD;  Location: [facility];  Service: Cardiovascular;  Laterality: N/A;  . AV NODE ABLATION N/A 03/30/2020   Procedure: AV NODE ABLATION;  Surgeon: Deboraha Sprang, MD;  Location: [facility];  Service: Cardiovascular;  Laterality: N/A;  . CARDIOVERSION Left 10/31/2018   Procedure: CARDIOVERSION;  Surgeon: Thayer Headings, MD;  Location: [facility];  Service: Cardiovascular;  Laterality: Left;  . CARDIOVERSION N/A 01/08/2020   Procedure: CARDIOVERSION;  Surgeon: [name], MD;  Location: [facility];  Service: Cardiovascular;  Laterality: N/A;  . Hemilaminectomy and microdiskectomy at L4-5 on the left.  12/10/2003  . PACEMAKER IMPLANT N/A 03/30/2020   Procedure: PACEMAKER IMPLANT;  Surgeon: Deboraha Sprang, MD;   Location: [facility];  Service: Cardiovascular;  Laterality: N/A;  . TEE WITHOUT CARDIOVERSION N/A 06/05/2018   Procedure: TRANSESOPHAGEAL ECHOCARDIOGRAM (TEE);  Surgeon: Lelon Perla, MD;  Location: [facility];  Service: Cardiovascular;  Laterality: N/A;  loop    Vitals:   09/04/20 1143  BP: 121/84  Pulse: 80     Subjective Assessment - 09/04/20 1138    Subjective Patient reports that appt with Cardiologist went well. Patient reports she was tired after the last session, reports mental fatigue.    Patient is accompained by: Family member    Pertinent History CAD, hx of CVA, PAF s/p DCCV, BPPV, a-fib, HLD    Patient Stated Goals Pt would like to be able to live normally with moving and walking easier.    Currently in Pain? No/denies                   Vestibular Assessment - 09/04/20 0001      Positional Testing   Dix-Hallpike Dix-Hallpike Right;Dix-Hallpike Left    Sidelying Test Sidelying Right;Sidelying Left      Dix-Hallpike Right   Dix-Hallpike Right Duration 0    Dix-Hallpike Right Symptoms No nystagmus      Dix-Hallpike Left   Dix-Hallpike Left Duration 0    Dix-Hallpike Left Symptoms No nystagmus      Sidelying Right   Sidelying Right Duration 0   moderate dizziness coming up   Sidelying Right  Symptoms No nystagmus      Sidelying Left   Sidelying Left Duration 0    Sidelying Left Symptoms No nystagmus      Orthostatics   BP supine (x 5 minutes) 120/73    HR supine (x 5 minutes) 79    BP sitting 119/76   mild dizziness from supine > sit   HR sitting 80    BP standing (after 1 minute) 106/70   mild dizziness from sit > stand   HR standing (after 1 minute) 80    BP standing (after 3 minutes) 113/69    HR standing (after 3 minutes) 80                    [facility] Adult PT Treatment/Exercise - 09/04/20 0001      Ambulation/Gait   Ambulation/Gait Yes    Ambulation/Gait Assistance 5: Supervision    Ambulation/Gait Assistance  Details ambulation throughout therapy gym with activities w/o AD    Ambulation Distance (Feet) --   clinic distance   Assistive device None    Gait Pattern Step-through pattern;Decreased arm swing - right;Decreased arm swing - left;Decreased step length - right;Decreased step length - left    Ambulation Surface Level;Indoor           Vestibular Treatment/Exercise - 09/04/20 0001      Vestibular Treatment/Exercise   Vestibular Treatment Provided Gaze    Gaze Exercises X1 Viewing Horizontal;X1 Viewing Vertical      X1 Viewing Horizontal   Foot Position seated    Reps 2    Comments x 30 seconds; mild dizziness      X1 Viewing Vertical   Foot Position seated    Reps 2    Comments x 30 seconds, mild dizziness (vertical > horizontal)              Balance Exercises - 09/04/20 0001      Balance Exercises: Standing   Rockerboard Anterior/posterior;Head turns;EO;Intermittent UE support;Limitations    Rockerboard Limitations standing on rockerboard A/P without UE support: completed horizontal/vertical head turns x 10 reps each direction. increased balance challenge with vertical > horizontal. Standing maintaing board steady with eyes open completed alternating toe taps to R/L cones x 15 reps bilaterally. intermittent CGA from PT and stops to regain balance, patietn doing well with balance strategies to regain balance without UE support.    Balance Beam standing across blue balance beam without UE support, compelted eyes closed 3 x 30 seconds.               PT Short Term Goals - 08/19/20 0909      PT SHORT TERM GOAL #1   Title Pt will decrease TUG from 14.67 sec to <12 sec for improved balance and functional mobility.    Baseline 08/18/20 14.67 sec    Time 4    Period Weeks    Status New    Target Date 09/18/20      PT SHORT TERM GOAL #2   Title Pt will increase FGA from 16 to >19/30 for improved balance and gait safety.    Baseline 08/18/20 16/30    Time 4    Period  Weeks    Status New    Target Date 09/18/20      PT SHORT TERM GOAL #3   Title Pt will ambulate >500' without AD supervision on level surfaces.    Time 4    Period Weeks    Status New  Target Date 09/18/20             PT Long Term Goals - 08/19/20 0912      PT LONG TERM GOAL #1   Title Pt will be independent with progressive HEP for strength, balance and functional mobility to continue gains on own.    Time 8    Period Weeks    Status New    Target Date 10/18/20      PT LONG TERM GOAL #2   Title Pt will increase gait speed to >0.38m/s without AD for improved gait safety.    Baseline 08/18/20 0.39m/s without AD    Time 8    Period Weeks    Status New    Target Date 10/18/20      PT LONG TERM GOAL #3   Title Pt will ambulate >800' on varied surfaces with LRAD mod I for improved community mobility.    Time 8    Period Weeks    Status New    Target Date 10/18/20      PT LONG TERM GOAL #4   Title Pt will increase FGA from 16 to >22/30 for improved balance and gait safety.    Baseline 08/18/20 16/30    Time 8    Period Weeks    Status New    Target Date 10/18/20                 Plan - 09/04/20 1232    Clinical Impression Statement Due to patient reports further assessed for BPPV today (due to prior history) and orthostatics. No BPPV noted with positional testing. Patient did have mild drop in BP with completion of sit > stand with mild symptoms but returned to baseline quickly. Rest of session spent focused on continued high level balance and SLS. Patient tolerating progression of activites well. Will continue to progress toward all LTGs.    Personal Factors and Comorbidities Age;Comorbidity 3+    Comorbidities CAD, hx of CVA, PAF s/p DCCV, BPPV, a-fib, HLD, seizure    Examination-Activity Limitations Bend;Bathing;Carry;Stand;Squat;Stairs;Toileting;Dressing;Transfers;Locomotion Level    Examination-Participation Restrictions Church;Yard Work;Driving;Community  Activity;Laundry;Meal Prep;Cleaning    Stability/Clinical Decision Making Evolving/Moderate complexity    Rehab Potential Good    PT Frequency 2x / week    PT Duration 8 weeks    PT Treatment/Interventions ADLs/Self Care Home Management;Aquatic Therapy;Biofeedback;Canalith Repostioning;Balance training;Therapeutic exercise;Manual techniques;Vestibular;Therapeutic activities;Functional mobility training;Stair training;Gait training;Patient/family education;DME Instruction;Neuromuscular re-education    PT Next Visit Plan Monitor BP. Has pacemaker set at 42. Continue gait w/o AD working on increasing step length, high level balance, functional strengthening/endurance. Continue VOR x 1, progress time. Habituation to Head Turns/Nods. Potential to add in [place] due to symptoms with sidelying to sit.    Consulted and Agree with Plan of Care Patient;Family member/caregiver    Family Member Consulted pt's husband: [name]           Patient will benefit from skilled therapeutic intervention in order to improve the following deficits and impairments:  Abnormal gait,Decreased endurance,Impaired sensation,Cardiopulmonary status limiting activity,Decreased knowledge of use of DME,Decreased activity tolerance,Decreased strength,Decreased balance,Decreased mobility,Dizziness,Impaired flexibility  Visit Diagnosis: Other abnormalities of gait and mobility  Muscle weakness (generalized)  Unsteadiness on feet  Dizziness and giddiness     Problem List Patient Active Problem List   Diagnosis Date Noted  . A-fib ([place]) 07/06/2020  . Pacemaker - MDT 07/06/2020  . New onset seizure ([place]) 06/28/2020  . 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  .  Hemiplegia and hemiparesis following cerebral infarction affecting left non-dominant side ([place]) 02/05/2020  . 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 CVA (cerebral vascular accident) ([facility]) 01/09/2020  . Dizziness 11/30/2018   . Paroxysmal atrial fibrillation ([place]) 08/13/2018  . Prediabetes 06/15/2018  . Palpitations 06/05/2018  . Essential hypertension 06/05/2018  . Hyperlipidemia LDL goal <70 06/05/2018  . Acute ischemic right MCA stroke ([place]) 06/05/2018  . Stroke (cerebrum) ([place]) - R MCA s/p tPA, embolic, source unknown 70/04/7492  . SVT (supraventricular tachycardia) ([place]) 05/28/2015  . PVC's (premature ventricular contractions) 05/28/2015  . BENIGN POSITIONAL VERTIGO 06/16/2009  . ALLERGIC RHINITIS 06/16/2009    Jones Bales, PT, DPT 09/04/2020, 12:35 PM  [facility] [address] [place], [place], [address] Phone: [phone]   Fax:  [phone]  Name: Mary Mercado MRN: [id number] Date of Birth: [birth year]

## 2020-09-07 ENCOUNTER — Encounter: Payer: Self-pay | Admitting: Neurology

## 2020-09-07 ENCOUNTER — Ambulatory Visit (INDEPENDENT_AMBULATORY_CARE_PROVIDER_SITE_OTHER): Payer: Medicare Other | Admitting: Neurology

## 2020-09-07 VITALS — BP 150/86 | Ht 65.0 in | Wt 126.2 lb

## 2020-09-07 DIAGNOSIS — Z8673 Personal history of transient ischemic attack (TIA), and cerebral infarction without residual deficits: Secondary | ICD-10-CM

## 2020-09-07 DIAGNOSIS — G40909 Epilepsy, unspecified, not intractable, without status epilepticus: Secondary | ICD-10-CM | POA: Diagnosis not present

## 2020-09-07 DIAGNOSIS — G40109 Localization-related (focal) (partial) symptomatic epilepsy and epileptic syndromes with simple partial seizures, not intractable, without status epilepticus: Secondary | ICD-10-CM

## 2020-09-07 DIAGNOSIS — I639 Cerebral infarction, unspecified: Secondary | ICD-10-CM | POA: Diagnosis not present

## 2020-09-07 MED ORDER — LEVETIRACETAM 500 MG PO TABS: 500.0000 mg | ORAL_TABLET | Freq: Two times a day (BID) | ORAL | 5 refills | Status: DC

## 2020-09-07 NOTE — Patient Instructions (Addendum)
I had a long discussion with the patient and her husband regarding her recent admission for new focal seizures which are likely symptomatic from remote strokes and I recommend she continue Keppra 500 mg twice daily for seizure prophylaxis which she seems to be tolerating well.  Will give her a refill for the Keppra.  She was also advised to continue Pradaxa for secondary stroke prevention given her history of atrial fibrillation and prior strokes as well as maintain aggressive risk factor modification with strict control of hypertension with blood pressure goal below 130/90, lipids with LDL cholesterol goal below 70 mg percent and diabetes with hemoglobin A1c goal below 6.5%.  She was also counseled to use her CPAP every night for her sleep apnea.  She will follow-up with Dr. Rexene Alberts for the same she was encouraged to use her wheeled walker at all times and we discussed fall safety precautions as well.  I recommend we repeat an EEG to look for any silent seizures.  She will return for follow-up in the future with Janett Billow my nurse practitioner in 6 months or call earlier if necessary.  Seizure, Adult A seizure is a sudden burst of abnormal electrical and chemical activity in the brain. Seizures usually last from 30 seconds to 2 minutes.  What are the causes? Common causes of this condition include:  Fever or infection.  Problems that affect the brain. These may include: ? A brain or head injury. ? Bleeding in the brain. ? A brain tumor.  Low levels of blood sugar or salt.  Kidney problems or liver problems.  Conditions that are passed from parent to child (are inherited).  Problems with a substance, such as: ? Having a reaction to a drug or a medicine. ? Stopping the use of a substance all of a sudden (withdrawal).  A stroke.  Disorders that affect how you develop. Sometimes, the cause may not be known.  What increases the risk?  Having someone in your family who has epilepsy. In this  condition, seizures happen again and again over time. They have no clear cause.  Having had a tonic-clonic seizure before. This type of seizure causes you to: ? Tighten the muscles of the whole body. ? Lose consciousness.  Having had a head injury or strokes before.  Having had a lack of oxygen at birth. What are the signs or symptoms? There are many types of seizures. The symptoms vary depending on the type of seizure you have. Symptoms during a seizure  Shaking that you cannot control (convulsions) with fast, jerky movements of muscles.  Stiffness of the body.  Breathing problems.  Feeling mixed up (confused).  Staring or not responding to sound or touch.  Head nodding.  Eyes that blink, flutter, or move fast.  Drooling, grunting, or making clicking sounds with your mouth  Losing control of when you pee or poop. Symptoms before a seizure  Feeling afraid, nervous, or worried.  Feeling like you may vomit.  Feeling like: ? You are moving when you are not. ? Things around you are moving when they are not.  Feeling like you saw or heard something before (dj vu).  Odd tastes or smells.  Changes in how you see. You may see flashing lights or spots. Symptoms after a seizure  Feeling confused.  Feeling sleepy.  Headache.  Sore muscles. How is this treated? If your seizure stops on its own, you will not need treatment. If your seizure lasts longer than 5 minutes, you will  normally need treatment. Treatment may include:  Medicines given through an IV tube.  Avoiding things, such as medicines, that are known to cause your seizures.  Medicines to prevent seizures.  A device to prevent or control seizures.  Surgery.  A diet low in carbohydrates and high in fat (ketogenic diet). Follow these instructions at home: Medicines  Take over-the-counter and prescription medicines only as told by your doctor.  Avoid foods or drinks that may keep your medicine from  working, such as alcohol. Activity  Follow instructions about driving, swimming, or doing things that would be dangerous if you had another seizure. Wait until your doctor says it is safe for you to do these things.  If you live in the [place] ask your local department of motor vehicles when you can drive.  Get a lot of rest. Teaching others  Teach friends and family what to do when you have a seizure. They should: ? Help you get down to the ground. ? Protect your head and body. ? Loosen any clothing around your neck. ? Turn you on your side. ? Know whether or not you need emergency care. ? Stay with you until you are better.  Also, tell them what not to do if you have a seizure. Tell them: ? They should not hold you down. ? They should not put anything in your mouth.   General instructions  Avoid anything that gives you seizures.  Keep a seizure diary. Write down: ? What you remember about each seizure. ? What you think caused each seizure.  Keep all follow-up visits. Contact a doctor if:  You have another seizure or seizures. Call the doctor each time you have a seizure.  The pattern of your seizures changes.  You keep having seizures with treatment.  You have symptoms of being sick or having an infection.  You are not able to take your medicine. Get help right away if:  You have any of these problems: ? A seizure that lasts longer than 5 minutes. ? Many seizures in a row and you do not feel better between seizures. ? A seizure that makes it harder to breathe. ? A seizure and you can no longer speak or use part of your body.  You do not wake up right after a seizure.  You get hurt during a seizure.  You feel confused or have pain right after a seizure. These symptoms may be an emergency. Get help right away. Call your local emergency services (911 in the [place]  Do not wait to see if the symptoms will go away.  Do not drive yourself to the  hospital. Summary  A seizure is a sudden burst of abnormal electrical and chemical activity in the brain. Seizures normally last from 30 seconds to 2 minutes.  Causes of seizures include illness, injury to the head, low levels of blood sugar or salt, and certain conditions.  Most seizures will stop on their own in less than 5 minutes. Seizures that last longer than 5 minutes are a medical emergency and need treatment right away.  Many medicines are used to treat seizures. Take over-the-counter and prescription medicines only as told by your doctor. This information is not intended to replace advice given to you by your health care provider. Make sure you discuss any questions you have with your health care provider. Document Revised: 10/18/2019 Document Reviewed: 10/18/2019 Elsevier Patient Education  [place].

## 2020-09-07 NOTE — Progress Notes (Signed)
[facility] [address] [place]. [place] [address] [phone]       OFFICE FOLLOW-UP NOTE  Ms. Fae Stetz Date of Birth:  Jun 17, 1940 Medical Record Number:  [id number]   Reason for visit: Stroke follow-up  Chief complaint: Chief Complaint  Patient presents with  . Follow-up    "Hospital FU for Seizure 06/28/20" Room 9, husband [name] in room       HPI:   Update 09/07/2020 ; Patient is seen today accompanied by her husband following recent hospital admission for seizures in March 2022.  She is seen today for a new problem of seizures.  Patient developed sudden onset of speech difficulties on 06/28/2020 with left upper extremity twitching's.  During transport by EMS she also complained of right-sided headache.  Upon arrival in the ER her twitchings resolved after treatment with IV lorazepam except for mild left thumb twitching.  She was loaded with IV Keppra and the involuntary movements stopped.  EEG showed focal right-sided slowing and MRI scan was negative for acute infarct LDL cholesterol was 71 mg percent and home statin dose was continued.  Hemoglobin A1c was 6.4.  Patient was on Pradaxa 150 mg twice daily which was continued.  Patient states she is tolerating Keppra well without any dizziness or other side effects.  She has had no recurrent seizures or twitching's.  She has had no recurrent stroke or TIA symptoms either.  She is tolerating Pradaxa well without bruising or bleeding.  She is seeing Dr. Frances Furbish for her sleep apnea and has been using her CPAP every night.  Blood pressure is normally well controlled at home the today it is elevated in office at 150/86.  She has no new complaints.  She has no prior history of seizures, significant head injury with loss of consciousness.  But she does have history of multiple strokes in the past2/2020 admitted for left-sided numbness weakness, status post TPA, MRI showed right frontal parietal infarct.  CTA head  neck showed right P2, left VA, bilateral ICA siphon severe stenosis.  EF 60 to 65%.  Negative for DVT, TEE negative.  LDL 117, A1c 5.9.  Discharged with DAPT and Lipitor 40  30-day CardioNet monitoring showed A. fib patient started on Eliquis.  12/2019  Admitted for right cerebellar infarct.  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  EF 60 to 65%, LDL 81, A1c 5.9.  Eliquis changed to Pradaxa, continue Lipitor 80.     Last visit 2/9/2022[name], NP), Ms. Zemp returns for 32-month stroke follow-up.  She has been stable from a stroke standpoint since prior visit without new stroke/TIA symptoms and reports residual gait unsteadiness with imbalance and dysarthria. She continues to use rolling walker and denies any recent falls. She also reports chronic left leg and torso numbness with occasional burning sensation which is chronic and denies worsening. She has remained on Pradaxa and atorvastatin 80 mg daily without side effects.  Blood pressure today 147/86.  She has routinely followed with cardiology for continued dizziness which she reports is in relation to blood pressure and cardiology currently working on adjustments.  She was evaluated by Dr. Frances Furbish and was found to have severe sleep apnea. Recommended titration study but patient requested holding off until further recovery from undergoing ablation for persistent A. 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 She is not ready to proceed with titration study. No further concerns at this time.  History provided for reference purposes only Hospital follow-up 02/03/2020 JM: She was discharged home from [facility] on 01/22/2020 after an 8-day stay.  Since discharge, she reports residual " whooshy head" sensation, gait unsteadiness and mild dysarthria but reports ongoing improvement.  Recommended outpatient PT/OT/SLP at discharge from CIR but referral was not placed at discharge.  She has continued  to do exercises at home as recommended during CIR stay.  Continues to use Rollator walker for ambulation and denies any recent falls.  Denies vertigo or dizziness sensation.  Denies new or worsening stroke/TIA symptoms.  Prior stroke deficit of left-sided paresthesias greatly improving and only experience occasional numbness/tingling.  Remains on Pradaxa without bleeding or bruising.  Remains on atorvastatin 80 mg daily without myalgias.  Blood pressure today 122/76.  Monitors at home which has been stable.  She does report urinary frequency over the past week and is concerned for possible UTI but has been increasing water intake and drinking cranberry juice.  Denies fever, dysuria or hematuria.  Typically, nocturia only on occasion but does report snoring, daytime naps and occasional insomnia.  She has not previously underwent sleep study.  No further concerns at this time.  Stroke admission 01/09/2020 Presented to ER with slurred speech, facial droop vertigo and ataxia while at her cardiologist office the morning of 01/09/2020.  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  Switched anticoagulation to Pradaxa.  HTN stable.  LDL 81 and increase atorvastatin from 40 mg to 80 mg daily.  Other stroke risk factors include advanced age and history of right MCA stroke in 05/2018.  Evaluated by therapy with residual dysarthria, mild memory deficits, vestibular symptoms and imbalance affecting ADLs and mobility therefore discharged to CIR with functional decline.  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 Old infarct R frontoparietal cortex. Small vessel disease. ASPECTS 10.     CTA head & neck no LVO. B ICA bifurcation atherosclerosis, supraclinoid B ICA atherosclerosis. L VA origin moderate to severe stenosis. R P2 moderate stenosis.   MRI  R superior cerebellar territory infarct.  Evolution of prior R MCA infarcts from last year. Small vessel disease.   2D Echo EF 60-65%. No source of embolus. LA severely dilated.  LDL 81  HgbA1c 5.9  VTE prophylaxis - Lovenox 40 mg sq daily   Eliquis (apixaban) daily prior to admission, now on aspirin 81 mg daily.   Switched to Pradaxa  Therapy recommendations:   CIR  Disposition:   CIR       History provided for reference purposes only Update 08/01/2019 JM: Ms. Norby is a 80 year old female who is being seen today, 08/01/2019, for stroke follow-up with residual left-sided paresthesias and post stroke anxiety accompanied by her husband.  Initiated Cymbalta at prior visit due to ongoing paresthesias and severe anxiety but unfortunately caused hypertension therefore advised to discontinue. Paresthesias have been stable from prior visit without worsening with intermittent symptoms that occur randomly.  She does continue to have occasional balance difficulties and if ambulating or standing for too long she will start to feel "off".  Referral placed at prior visit to PT but apparently has not been called to schedule initial evaluation.  She continues to experience anxiety which has only been present for stroke but does feel slight improvement since prior visit.  Remains on Eliquis and atorvastatin without side effects.  Blood pressure today 132/74.  No further concerns at this time.  Update 05/02/2019: Ms. Waldrop is a 80 year old female who is being seen today for stroke follow-up accompanied by her husband.  Residual deficits left-sided paresthesias consisting of numbness/tingling and occasional burning sensation.  She also endorses episodes of sensation of full body "intermittently shaking" and is usually worsened by stress, anxiety or fatigue.  Usually accompanied by nausea but denies headache or dizziness.  She becomes fearful during those times that she may be having a heart attack.  When questioned regarding anxiety, she does endorse  minimal anxiety but after doing GAD-7 test, score of 20/21 showing severe anxiety.  She denies any prior history or family history of depression or anxiety.  She continues on Eliquis without bleeding or bruising.  She continues on atorvastatin without myalgias.  Blood pressure today 140/81.  She denies any additional episodes of dizziness and continues to follow with cardiology for blood pressure management.  She has recently decreased telmisartan as it is recommended to continue blood pressure between 130-150 due to vertebral artery stenosis.  No further concerns at this time.   Update 11/19/2018 Dr. Leonie Man: She is seen today for office follow-up visit following initial video follow-up visit on 08/13/2018.  She is accompanied by her husband.  She states she continues to have left leg paresthesias and numbness.  She has some mild gait and balance difficulties.  At times she noticed that the legs are trembling and she is initiating walk and she has to hold on something and the feeling goes away.  She has also been started on several new cardiac medications per her ablation for A. fib and feels medication may be the cause for her dizziness.  She is tolerating Eliquis well without bruising or bleeding.  Blood pressures well controlled and today it is 136/79.  She remains on Lipitor which is tolerating well without muscle aches and pains.  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  Initial video visit 08/13/2018 Dr. Leonie Man : This is a initial video virtual consultation visit on Ms. Brosch who was admitted to [facility] in February 2020 with a stroke.  I have personally obtained history of presenting illness from the patient and her husband and reviewed electronic medical records as well as imaging films in PACS.  She was admitted on 05/31/2018 with sudden onset of left arm and leg weakness and numbness as well as feeling dizzy and  had one episode of emesis.  She had CT scan and CT angiogram in the ER which showed no large vessel occlusion she was given IV TPA and admitted to the neurological intensive care unit.  NIH stroke scale on admission was 4.  At baseline modified Rankin score was 0.  Patient had tight blood pressure control.  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  Transthoracic echo showed normal ejection fraction.  Transesophageal echocardiogram showed no cardiac source of embolism or PFO.  LDL cholesterol was elevated at 1 1 7  mg percent.  Hemoglobin A1c was 5.9.  Urine drug screen was negative.  Lower extremity venous Dopplers were negative for DVT.  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  Patient was started on aspirin 81 and  Plavix 75 mg daily for 3 weeks and underwent an outpatient 30-day heart monitor which subsequently showed paroxysmal atrial fibrillation.  She has since then stopped aspirin and Plavix and has been switched to Eliquis.  Patient states she has done well since discharge.  She was initially transferred to inpatient rehab where she stayed for a few weeks and did well.  She is still has left-sided numbness but feels some of the sensation may be coming back now.  She can walk independently without assistance though her husband stays close by.  She has had no falls or injuries.  She is tolerating Eliquis well without bleeding or bruising.  She is also tolerating Lipitor well.  She complains of little bit of jitteriness and tiredness but she blames this likely on her new A. fib medication.  She has no new complaints.  She has no prior history of strokes TIAs.       ROS:   14 system review of systems is positive for those listed in  HPI and all other systems negative  PMH:  Past Medical History:  Diagnosis Date  . Allergy   . CVA (cerebral vascular accident) (HCC) 05/2018, 01/09/20  . Hyperlipidemia LDL goal <70   . Hypertension   . Palpitations   . 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 Marital status: Married    Spouse name: Reita ClicheBobby  . Number of children: Not on file  . Years of education: 12+  . Highest education level: Not on file  Occupational History    Comment: retired  Tobacco Use  . Smoking status: Never Smoker  . Smokeless tobacco: Never Used  Vaping Use  . Vaping Use: Never used  Substance and Sexual Activity  . Alcohol use: Never  . Drug use: Never  . Sexual activity: Not on file  Other Topics Concern  . Not on file  Social History Narrative   Lives with husband   caffeine none daily   Right Handed   Social Determinants of Health   Financial Resource Strain: Not on file  Food Insecurity: Not on file  Transportation Needs: Not on file  Physical Activity: Not on file  Stress: Not on file  Social Connections: Not on file  Intimate Partner Violence: Not on file    Medications:   Current Outpatient Medications on File Prior to Visit  Medication Sig Dispense Refill  . atorvastatin (LIPITOR) 80 MG tablet TAKE 1 TABLET (80 MG TOTAL) BY MOUTH DAILY AT 6 PM. 30 tablet 11  . losartan (COZAAR) 50 MG tablet Take 1 tablet (50 mg total) by mouth daily. 90 tablet 3  . metoprolol tartrate (LOPRESSOR) 25 MG tablet TAKE 0.5 TABLETS BY MOUTH AT BEDTIME. 45 tablet 3  . PRADAXA 150 MG CAPS capsule TAKE 1 CAPSULE (150 MG TOTAL) BY MOUTH EVERY 12 (TWELVE) HOURS. 60 capsule 11   Current Facility-Administered Medications on File Prior to Visit  Medication Dose Route Frequency Provider Last Rate Last Admin  . regadenoson (LEXISCAN) injection SOLN 0.4 mg  0.4 mg Intravenous Once Jodelle Redhristopher, Bridgette, MD      . technetium  tetrofosmin (TC-MYOVIEW) injection 32 millicurie  32 millicurie Intravenous Once PRN Jodelle Redhristopher, Bridgette, MD        Allergies:   Allergies  Allergen Reactions  . Duloxetine     Causes Bp elevation.  Increased body jerks, nausea  . Acetaminophen Hives and Rash    Physical  Exam  Today's Vitals   09/07/20 0943  BP: (!) 150/86  Weight: 126 lb 3.2 oz (57.2 kg)  Height: 5\' 5"  (1.651 m)   Body mass index is 21 kg/m.   General: frail cachectic looking pleasant elderly Caucasian lady seated, in no evident distress Head: head normocephalic and atraumatic.  Neck: supple with no carotid or supraclavicular bruits Cardiovascular: irregular rate and rhythm, no murmurs Musculoskeletal: no deformity Skin:  no rash/petichiae Vascular:  Normal pulses all extremities   Neurologic Exam Mental Status: Awake and fully alert.  Mild dysarthria with occasional speech hesitancy and word finding difficulty. Oriented to place and time. Recent and remote memory intact. Attention span, concentration and fund of knowledge appropriate. Mood and affect appropriate Cranial Nerves: Pupils equal, briskly reactive to light. Extraocular movements full without nystagmus. Visual fields full to confrontation. Hearing intact. Facial sensation intact. Face, tongue, palate moves normally and symmetrically.  Motor: Normal bulk and tone. Normal strength in all tested extremity muscles except mild left hip flexor weakness and decreased left hand finger dexterity.  Fine action tremor of outstretched upper extremities left greater than right Sensory.:  Intact light touch, vibratory and pinprick sensation Coordination: Rapid alternating movements normal in all extremities except decreased left hand bilaterally. Finger-to-nose and heel-to-shin performed accurately bilaterally.  Mild action tremors bilateral upper extremities left greater than right.  No tremors at rest or evidence of cogwheel rigidity or bradykinesia Gait and  Station: Arises from chair without difficulty. Stance is normal. Gait demonstrates normal stride length and slight unsteadiness/imbalance with use of Rollator walker. Reflexes: 1+ and symmetric. Toes downgoing.       ASSESSMENT/PLAN: 80 year old Caucasian lady with recent embolic right cerebellar infarct secondary to known AF on Eliquis on 01/09/2020 and history of right MCA infarct in February 2020 secondary to paroxysmal atrial fibrillation which was found on outpatient cardiac event monitor after discharge.  Vascular risk factors of hypertension, hyperlipidemia, A. 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 Also recently diagnosed with  severe sleep apnea currently awaiting to undergo titration study.  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  Will give her a refill for the Keppra.  She was also advised to continue Pradaxa for secondary stroke prevention given her history of atrial fibrillation and prior strokes as well as maintain aggressive risk factor modification with strict control of hypertension with blood pressure goal below 130/90, lipids with LDL cholesterol goal below 70 mg percent and diabetes with hemoglobin A1c goal below 6.5%.  She was also counseled to use her CPAP every night for her sleep apnea.  She will follow-up with Dr. Rexene Alberts for the same she was encouraged to use her wheeled walker at all times and we discussed fall safety precautions as well.  I recommend we repeat an EEG to look for any silent seizures.  She will return for follow-up in the future with Janett Billow my nurse practitioner in 6 months  or call earlier if necessary. I spent 45 minutes of face-to-face and non-face-to-face time with patient and husband discussing about her new onset seizures and prior history of strokes.  This included previsit chart review, lab review,  study review, order entry, electronic health record documentation, patient education regarding prior stroke with residual deficits, importance of ongoing management of stroke risk factors as well as new diagnosis of sleep apnea and answered all other questions to patient and husband satisfaction   Antony Contras, MD  [facility] [address] [place], [place] [address]  Phone [phone] Fax [phone] Note: This document was prepared with digital dictation and possible smart phrase technology. Any transcriptional errors that result from this process are unintentional.

## 2020-09-08 ENCOUNTER — Ambulatory Visit: Payer: Medicare Other

## 2020-09-08 ENCOUNTER — Other Ambulatory Visit: Payer: Self-pay

## 2020-09-08 DIAGNOSIS — R2689 Other abnormalities of gait and mobility: Secondary | ICD-10-CM

## 2020-09-08 DIAGNOSIS — R2681 Unsteadiness on feet: Secondary | ICD-10-CM | POA: Diagnosis not present

## 2020-09-08 DIAGNOSIS — M6281 Muscle weakness (generalized): Secondary | ICD-10-CM

## 2020-09-08 DIAGNOSIS — R42 Dizziness and giddiness: Secondary | ICD-10-CM | POA: Diagnosis not present

## 2020-09-08 NOTE — Therapy (Signed)
[facility] [address] [place], [place], [address] Phone: [phone]   Fax:  [phone]  Physical Therapy Treatment  Patient Details  Name: Mary Mercado MRN: [id number] Date of Birth: [birth year] Referring Provider (PT): [name]   Encounter Date: 09/08/2020   PT End of Session - 09/08/20 1442    Visit Number 7    Number of Visits 17    Date for PT Re-Evaluation 11/16/20   60 day poc, 90 day cert   Authorization Type Medicare so 10th visit progress note    PT Start Time 1440    PT Stop Time 1524    PT Time Calculation (min) 44 min    Equipment Utilized During Treatment Gait belt    Activity Tolerance Patient tolerated treatment well    Behavior During Therapy [facility] for tasks assessed/performed           Past Medical History:  Diagnosis Date  . Allergy   . CVA (cerebral vascular accident) ([place]) 05/2018, 01/09/20  . Hyperlipidemia LDL goal <70   . Hypertension   . Palpitations   . Vertigo, benign positional     Past Surgical History:  Procedure Laterality Date  . AV NODE ABLATION N/A 03/30/2020   Procedure: AV NODE ABLATION;  Surgeon: Deboraha Sprang, MD;  Location: [facility];  Service: Cardiovascular;  Laterality: N/A;  . AV NODE ABLATION N/A 03/30/2020   Procedure: AV NODE ABLATION;  Surgeon: Deboraha Sprang, MD;  Location: [facility];  Service: Cardiovascular;  Laterality: N/A;  . CARDIOVERSION Left 10/31/2018   Procedure: CARDIOVERSION;  Surgeon: Thayer Headings, MD;  Location: [facility];  Service: Cardiovascular;  Laterality: Left;  . CARDIOVERSION N/A 01/08/2020   Procedure: CARDIOVERSION;  Surgeon: [name], MD;  Location: [facility] ENDOSCOPY;  Service: Cardiovascular;  Laterality: N/A;  . Hemilaminectomy and microdiskectomy at L4-5 on the left.  12/10/2003  . PACEMAKER IMPLANT N/A 03/30/2020   Procedure: PACEMAKER IMPLANT;  Surgeon: Deboraha Sprang, MD;   Location: [place] [facility];  Service: Cardiovascular;  Laterality: N/A;  . TEE WITHOUT CARDIOVERSION N/A 06/05/2018   Procedure: TRANSESOPHAGEAL ECHOCARDIOGRAM (TEE);  Surgeon: Lelon Perla, MD;  Location: [facility] ENDOSCOPY;  Service: Cardiovascular;  Laterality: N/A;  loop    There were no vitals filed for this visit.   Subjective Assessment - 09/08/20 1442    Subjective Pt reports that today is a good day. She saw Dr. Leonie Man yesterday and reports visit went well. He is going to order a follow up EEG. No med changes.    Patient is accompained by: Family member    Pertinent History CAD, hx of CVA, PAF s/p DCCV, BPPV, a-fib, HLD    Patient Stated Goals Pt would like to be able to live normally with moving and walking easier.    Currently in Pain? No/denies                             [facility] Adult PT Treatment/Exercise - 09/08/20 1445      Ambulation/Gait   Ambulation/Gait Yes    Ambulation/Gait Assistance 5: Supervision    Ambulation/Gait Assistance Details Pt was given verbal cues to try to increase step length and relax arms for more arm swing. Pt able to carry on conversation throughout but did report feeling tired and needing to rest at end and a little winded.    Ambulation Distance (Feet)  575 Feet    Assistive device None    Gait Pattern Step-through pattern;Decreased step length - right;Decreased step length - left;Decreased arm swing - right;Decreased arm swing - left    Ambulation Surface Level;Indoor      Neuro Re-ed    Neuro Re-ed Details  In // bars: standing on airex with feet together head turns left/right and up/down x 10 each. Marching in place on airex x 10. Standing taking a step diagonally forward to place clip on pole then back x 5 to right and x 5 to left. Pt reported some woozy/nausous feeling when stopped after first 5 but improved on second set. Pt was instructed to follow clip with her eyes. Stepping forward at a diagonal to get clip off table then  shifting weight and squatting to set on stool to otherside then reversing to pick back up x 5 each. Pt denied any wooziness with this. Was slow to come back up from squat. Pt ambulated 230' with small pertubations in varied directions CGA. She improved stability during second half not staggering with pertubations as much.                    PT Short Term Goals - 08/19/20 0909      PT SHORT TERM GOAL #1   Title Pt will decrease TUG from 14.67 sec to <12 sec for improved balance and functional mobility.    Baseline 08/18/20 14.67 sec    Time 4    Period Weeks    Status New    Target Date 09/18/20      PT SHORT TERM GOAL #2   Title Pt will increase FGA from 16 to >19/30 for improved balance and gait safety.    Baseline 08/18/20 16/30    Time 4    Period Weeks    Status New    Target Date 09/18/20      PT SHORT TERM GOAL #3   Title Pt will ambulate >500' without AD supervision on level surfaces.    Time 4    Period Weeks    Status New    Target Date 09/18/20             PT Long Term Goals - 08/19/20 0912      PT LONG TERM GOAL #1   Title Pt will be independent with progressive HEP for strength, balance and functional mobility to continue gains on own.    Time 8    Period Weeks    Status New    Target Date 10/18/20      PT LONG TERM GOAL #2   Title Pt will increase gait speed to >0.78m/s without AD for improved gait safety.    Baseline 08/18/20 0.22m/s without AD    Time 8    Period Weeks    Status New    Target Date 10/18/20      PT LONG TERM GOAL #3   Title Pt will ambulate >800' on varied surfaces with LRAD mod I for improved community mobility.    Time 8    Period Weeks    Status New    Target Date 10/18/20      PT LONG TERM GOAL #4   Title Pt will increase FGA from 16 to >22/30 for improved balance and gait safety.    Baseline 08/18/20 16/30    Time 8    Period Weeks    Status New    Target Date 10/18/20  Plan - 09/08/20  1527    Clinical Impression Statement Pt was able to increase gait distance today without her rollator when PT kept her distracted throughout. She does continue to have decreased step length and decreased trunk rotation with gait. Focused on transitional stepping activities with reaching activities to mimic tasks like reaching in cabinet and dryer today. Pt had a little wooziness initially when looking up when stopped but improved some as went on.    Personal Factors and Comorbidities Age;Comorbidity 3+    Comorbidities CAD, hx of CVA, PAF s/p DCCV, BPPV, a-fib, HLD, seizure    Examination-Activity Limitations Bend;Bathing;Carry;Stand;Squat;Stairs;Toileting;Dressing;Transfers;Locomotion Level    Examination-Participation Restrictions Church;Yard Work;Driving;Community Activity;Laundry;Meal Prep;Cleaning    Stability/Clinical Decision Making Evolving/Moderate complexity    Rehab Potential Good    PT Frequency 2x / week    PT Duration 8 weeks    PT Treatment/Interventions ADLs/Self Care Home Management;Aquatic Therapy;Biofeedback;Canalith Repostioning;Balance training;Therapeutic exercise;Manual techniques;Vestibular;Therapeutic activities;Functional mobility training;Stair training;Gait training;Patient/family education;DME Instruction;Neuromuscular re-education    PT Next Visit Plan Monitor BP. Has pacemaker set at 43. Continue gait w/o AD working on increasing step length, high level balance, functional strengthening/endurance. Continue VOR x 1, progress time. Habituation to Head Turns/Nods. Potential to add in [place] due to symptoms with sidelying to sit.    Consulted and Agree with Plan of Care Patient;Family member/caregiver    Family Member Consulted pt's husband: [name]           Patient will benefit from skilled therapeutic intervention in order to improve the following deficits and impairments:  Abnormal gait,Decreased endurance,Impaired sensation,Cardiopulmonary status limiting  activity,Decreased knowledge of use of DME,Decreased activity tolerance,Decreased strength,Decreased balance,Decreased mobility,Dizziness,Impaired flexibility  Visit Diagnosis: Other abnormalities of gait and mobility  Muscle weakness (generalized)  Unsteadiness on feet     Problem List Patient Active Problem List   Diagnosis Date Noted  . A-fib ([place]) 07/06/2020  . Pacemaker - MDT 07/06/2020  . New onset seizure ([place]) 06/28/2020  . 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  . Hemiplegia and hemiparesis following cerebral infarction affecting left non-dominant side ([place]) 02/05/2020  . 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 CVA (cerebral vascular accident) ([place]) 01/09/2020  . Dizziness 11/30/2018  . Paroxysmal atrial fibrillation ([place]) 08/13/2018  . Prediabetes 06/15/2018  . Palpitations 06/05/2018  . Essential hypertension 06/05/2018  . Hyperlipidemia LDL goal <70 06/05/2018  . Acute ischemic right MCA stroke ([place]) 06/05/2018  . Stroke (cerebrum) ([place]) - R MCA s/p tPA, embolic, source unknown [id number]/8295  . SVT (supraventricular tachycardia) ([place]) 05/28/2015  . PVC's (premature ventricular contractions) 05/28/2015  . BENIGN POSITIONAL VERTIGO 06/16/2009  . ALLERGIC RHINITIS 06/16/2009    Electa Sniff, PT, DPT, NCS 09/08/2020, 3:29 PM  [facility] [address] [place], [place], [address] Phone: [phone]   Fax:  [phone]  Name: Mary Mercado MRN: [id number] Date of Birth: 24-Apr-1941

## 2020-09-10 ENCOUNTER — Other Ambulatory Visit: Payer: Self-pay

## 2020-09-10 ENCOUNTER — Ambulatory Visit: Payer: Medicare Other

## 2020-09-10 DIAGNOSIS — M6281 Muscle weakness (generalized): Secondary | ICD-10-CM | POA: Diagnosis not present

## 2020-09-10 DIAGNOSIS — R2689 Other abnormalities of gait and mobility: Secondary | ICD-10-CM

## 2020-09-10 DIAGNOSIS — R2681 Unsteadiness on feet: Secondary | ICD-10-CM

## 2020-09-10 DIAGNOSIS — R42 Dizziness and giddiness: Secondary | ICD-10-CM | POA: Diagnosis not present

## 2020-09-10 NOTE — Therapy (Signed)
[facility] [address] [place], [place], [address] Phone: [phone]   Fax:  [phone]  Physical Therapy Treatment  Patient Details  Name: Mary Mercado MRN: [id number] Date of Birth: 08-16-1940 Referring Provider (PT): [name]   Encounter Date: 09/10/2020   PT End of Session - 09/10/20 0934    Visit Number 8    Number of Visits 17    Date for PT Re-Evaluation 11/16/20   60 day poc, 90 day cert   Authorization Type Medicare so 10th visit progress note    PT Start Time 0932    PT Stop Time 1015    PT Time Calculation (min) 43 min    Equipment Utilized During Treatment Gait belt    Activity Tolerance Patient tolerated treatment well    Behavior During Therapy [facility] for tasks assessed/performed           Past Medical History:  Diagnosis Date  . Allergy   . CVA (cerebral vascular accident) ([place]) 05/2018, 01/09/20  . Hyperlipidemia LDL goal <70   . Hypertension   . Palpitations   . Vertigo, benign positional     Past Surgical History:  Procedure Laterality Date  . AV NODE ABLATION N/A 03/30/2020   Procedure: AV NODE ABLATION;  Surgeon: Deboraha Sprang, MD;  Location: [facility];  Service: Cardiovascular;  Laterality: N/A;  . AV NODE ABLATION N/A 03/30/2020   Procedure: AV NODE ABLATION;  Surgeon: Deboraha Sprang, MD;  Location: [facility];  Service: Cardiovascular;  Laterality: N/A;  . CARDIOVERSION Left 10/31/2018   Procedure: CARDIOVERSION;  Surgeon: Thayer Headings, MD;  Location: [facility];  Service: Cardiovascular;  Laterality: Left;  . CARDIOVERSION N/A 01/08/2020   Procedure: CARDIOVERSION;  Surgeon: [name], MD;  Location: [facility] ENDOSCOPY;  Service: Cardiovascular;  Laterality: N/A;  . Hemilaminectomy and microdiskectomy at L4-5 on the left.  12/10/2003  . PACEMAKER IMPLANT N/A 03/30/2020   Procedure: PACEMAKER IMPLANT;  Surgeon: Deboraha Sprang, MD;   Location: [place] [facility];  Service: Cardiovascular;  Laterality: N/A;  . TEE WITHOUT CARDIOVERSION N/A 06/05/2018   Procedure: TRANSESOPHAGEAL ECHOCARDIOGRAM (TEE);  Surgeon: Lelon Perla, MD;  Location: [facility] ENDOSCOPY;  Service: Cardiovascular;  Laterality: N/A;  loop    There were no vitals filed for this visit.   Subjective Assessment - 09/10/20 0934    Subjective Pt reports that she felt good when riding in car today with no dizziness noted.    Patient is accompained by: Family member    Pertinent History CAD, hx of CVA, PAF s/p DCCV, BPPV, a-fib, HLD    Patient Stated Goals Pt would like to be able to live normally with moving and walking easier.    Currently in Pain? No/denies                             [facility] Adult PT Treatment/Exercise - 09/10/20 0935      Ambulation/Gait   Ambulation/Gait Yes    Ambulation/Gait Assistance 5: Supervision;4: Min guard    Ambulation/Gait Assistance Details Pt reported feeling a little SOB while walking and 7/10 RPE at the end. Pt was cued to try to relax arms to get more arm swing and increase steps. Was tense throughout gait.    Ambulation Distance (Feet) 550 Feet    Assistive device None    Gait Pattern Step-through pattern    Ambulation  Surface Level;Unlevel;Indoor;Outdoor;Paved      Neuro Re-ed    Neuro Re-ed Details  Standing with moving ball in diagonals x 10 each side. Walking with moving ball side to side and then up/down following with eyes 25' x 2 each. Pt kept straighter path up/down but felt a little more woozy. She was given cues to try to increase step length.           Vestibular Treatment/Exercise - 09/10/20 0001      Vestibular Treatment/Exercise   Vestibular Treatment Provided Gaze    Gaze Exercises X1 Viewing Horizontal;X1 Viewing Vertical      X1 Viewing Horizontal   Foot Position seated and standing    Reps 2    Comments x 30 seconds; mild wooziness mostly when stopped that improved as went  on. Performed 2 reps seated and standing.      X1 Viewing Vertical   Foot Position seated and standing    Reps 2    Comments x 30 seconds, mild dizziness when looking up mostly. Performed 2 reps seated and standing                 PT Education - 09/10/20 1022    Education Details PT discussed with pt about trying to increase time she walks at home. Starting at 5-6 minutes and gradually increasing every week or so. To monitor herself with RPE scale and not to get above 7/10.    Person(s) Educated Patient;Spouse    Methods Explanation    Comprehension Verbalized understanding            PT Short Term Goals - 08/19/20 0909      PT SHORT TERM GOAL #1   Title Pt will decrease TUG from 14.67 sec to <12 sec for improved balance and functional mobility.    Baseline 08/18/20 14.67 sec    Time 4    Period Weeks    Status New    Target Date 09/18/20      PT SHORT TERM GOAL #2   Title Pt will increase FGA from 16 to >19/30 for improved balance and gait safety.    Baseline 08/18/20 16/30    Time 4    Period Weeks    Status New    Target Date 09/18/20      PT SHORT TERM GOAL #3   Title Pt will ambulate >500' without AD supervision on level surfaces.    Time 4    Period Weeks    Status New    Target Date 09/18/20             PT Long Term Goals - 08/19/20 0912      PT LONG TERM GOAL #1   Title Pt will be independent with progressive HEP for strength, balance and functional mobility to continue gains on own.    Time 8    Period Weeks    Status New    Target Date 10/18/20      PT LONG TERM GOAL #2   Title Pt will increase gait speed to >0.54m/s without AD for improved gait safety.    Baseline 08/18/20 0.53m/s without AD    Time 8    Period Weeks    Status New    Target Date 10/18/20      PT LONG TERM GOAL #3   Title Pt will ambulate >800' on varied surfaces with LRAD mod I for improved community mobility.    Time 8    Period  Weeks    Status New    Target Date  10/18/20      PT LONG TERM GOAL #4   Title Pt will increase FGA from 16 to >22/30 for improved balance and gait safety.    Baseline 08/18/20 16/30    Time 8    Period Weeks    Status New    Target Date 10/18/20                 Plan - 09/10/20 1024    Clinical Impression Statement PT was able to transition to gait outside today. Pt appeared nervous with going without rollator and distance. PT explained that she had walked farther last session actually. Pt tense with gait with decreased step length. Reports 7/10 RPE after. PT discussed with pt and husband about working on gradually increasing the time she walker at home. When moves into independent living facility there will be lots of walking to get to cafeteria area. Also worked on gaze stabilization activities with pt reporting only mild wooziness mostly after she stopped. When added eye movements with gait pt does decrease step length.    Personal Factors and Comorbidities Age;Comorbidity 3+    Comorbidities CAD, hx of CVA, PAF s/p DCCV, BPPV, a-fib, HLD, seizure    Examination-Activity Limitations Bend;Bathing;Carry;Stand;Squat;Stairs;Toileting;Dressing;Transfers;Locomotion Level    Examination-Participation Restrictions Church;Yard Work;Driving;Community Activity;Laundry;Meal Prep;Cleaning    Stability/Clinical Decision Making Evolving/Moderate complexity    Rehab Potential Good    PT Frequency 2x / week    PT Duration 8 weeks    PT Treatment/Interventions ADLs/Self Care Home Management;Aquatic Therapy;Biofeedback;Canalith Repostioning;Balance training;Therapeutic exercise;Manual techniques;Vestibular;Therapeutic activities;Functional mobility training;Stair training;Gait training;Patient/family education;DME Instruction;Neuromuscular re-education    PT Next Visit Plan STG goal check due end of next week. Monitor BP. Has pacemaker set at 25. Continue gait w/o AD working on increasing step length, high level balance, functional  strengthening/endurance. Continue VOR x 1, progress time. Habituation to Head Turns/Nods. Potential to add in [place] due to symptoms with sidelying to sit.    Consulted and Agree with Plan of Care Patient;Family member/caregiver    Family Member Consulted pt's husband: [name]           Patient will benefit from skilled therapeutic intervention in order to improve the following deficits and impairments:  Abnormal gait,Decreased endurance,Impaired sensation,Cardiopulmonary status limiting activity,Decreased knowledge of use of DME,Decreased activity tolerance,Decreased strength,Decreased balance,Decreased mobility,Dizziness,Impaired flexibility  Visit Diagnosis: Other abnormalities of gait and mobility  Unsteadiness on feet     Problem List Patient Active Problem List   Diagnosis Date Noted  . A-fib ([place]) 07/06/2020  . Pacemaker - MDT 07/06/2020  . New onset seizure ([place]) 06/28/2020  . 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  . Hemiplegia and hemiparesis following cerebral infarction affecting left non-dominant side ([place]) 02/05/2020  . 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 CVA (cerebral vascular accident) ([place]) 01/09/2020  . Dizziness 11/30/2018  . Paroxysmal atrial fibrillation ([place]) 08/13/2018  . Prediabetes 06/15/2018  . Palpitations 06/05/2018  . Essential hypertension 06/05/2018  . Hyperlipidemia LDL goal <70 06/05/2018  . Acute ischemic right MCA stroke ([place]) 06/05/2018  . Stroke (cerebrum) ([place]) - R MCA s/p tPA, embolic, source unknown [id number]  . SVT (supraventricular tachycardia) ([place]) 05/28/2015  . PVC's (premature ventricular contractions) 05/28/2015  . BENIGN POSITIONAL VERTIGO 06/16/2009  . ALLERGIC RHINITIS 06/16/2009    Electa Sniff, PT, DPT, NCS 09/10/2020, 10:27 AM  [facility] [phone]  [place], [place], [address] Phone: [phone]   Fax:   [phone]  Name: Seanne Chirico MRN: [id number] Date of Birth: 1940-11-20

## 2020-09-15 ENCOUNTER — Encounter: Payer: Self-pay | Admitting: Physical Therapy

## 2020-09-15 ENCOUNTER — Ambulatory Visit: Payer: Medicare Other | Admitting: Physical Therapy

## 2020-09-15 ENCOUNTER — Other Ambulatory Visit: Payer: Self-pay

## 2020-09-15 DIAGNOSIS — R2681 Unsteadiness on feet: Secondary | ICD-10-CM | POA: Diagnosis not present

## 2020-09-15 DIAGNOSIS — R2689 Other abnormalities of gait and mobility: Secondary | ICD-10-CM | POA: Diagnosis not present

## 2020-09-15 DIAGNOSIS — R42 Dizziness and giddiness: Secondary | ICD-10-CM

## 2020-09-15 DIAGNOSIS — M6281 Muscle weakness (generalized): Secondary | ICD-10-CM

## 2020-09-15 NOTE — Patient Instructions (Signed)
Access Code: [email or web address] URL: [email or web address] Date: 09/15/2020 Prepared by: Janann August  Exercises Standing on foam eyes open - 2 x daily - 5 x weekly - 1 sets - 2 reps - 30 sec hold Standing head turns - 2 x daily - 5 x weekly - 2 sets - 10 reps Romberg Stance with Head Nods - 2 x daily - 5 x weekly - 2 sets - 10 reps Standing Marching - 2 x daily - 5 x weekly - 2 sets - 10 reps Standing Quarter Turn - 2 x daily - 5 x weekly - 1 sets - 4-5 reps

## 2020-09-15 NOTE — Therapy (Signed)
[facility] [address] [place], [place], [address] Phone: [phone]   Fax:  [phone]  Physical Therapy Treatment  Patient Details  Name: Mary Mercado MRN: [id number] Date of Birth: Aug 23, 1940 Referring Provider (PT): [name]   Encounter Date: 09/15/2020   PT End of Session - 09/15/20 1349    Visit Number 9    Number of Visits 17    Date for PT Re-Evaluation 11/16/20   60 day poc, 90 day cert   Authorization Type Medicare so 10th visit progress note    PT Start Time 1101    PT Stop Time 1145    PT Time Calculation (min) 44 min    Equipment Utilized During Treatment Gait belt    Activity Tolerance Patient tolerated treatment well    Behavior During Therapy [facility] for tasks assessed/performed           Past Medical History:  Diagnosis Date  . Allergy   . CVA (cerebral vascular accident) ([place]) 05/2018, 01/09/20  . Hyperlipidemia LDL goal <70   . Hypertension   . Palpitations   . Vertigo, benign positional     Past Surgical History:  Procedure Laterality Date  . AV NODE ABLATION N/A 03/30/2020   Procedure: AV NODE ABLATION;  Surgeon: Deboraha Sprang, MD;  Location: [facility];  Service: Cardiovascular;  Laterality: N/A;  . AV NODE ABLATION N/A 03/30/2020   Procedure: AV NODE ABLATION;  Surgeon: Deboraha Sprang, MD;  Location: [facility];  Service: Cardiovascular;  Laterality: N/A;  . CARDIOVERSION Left 10/31/2018   Procedure: CARDIOVERSION;  Surgeon: Thayer Headings, MD;  Location: [facility];  Service: Cardiovascular;  Laterality: Left;  . CARDIOVERSION N/A 01/08/2020   Procedure: CARDIOVERSION;  Surgeon: [name], MD;  Location: [facility] ENDOSCOPY;  Service: Cardiovascular;  Laterality: N/A;  . Hemilaminectomy and microdiskectomy at L4-5 on the left.  12/10/2003  . PACEMAKER IMPLANT N/A 03/30/2020   Procedure: PACEMAKER IMPLANT;  Surgeon: Deboraha Sprang, MD;   Location: [facility];  Service: Cardiovascular;  Laterality: N/A;  . TEE WITHOUT CARDIOVERSION N/A 06/05/2018   Procedure: TRANSESOPHAGEAL ECHOCARDIOGRAM (TEE);  Surgeon: Lelon Perla, MD;  Location: [facility] ENDOSCOPY;  Service: Cardiovascular;  Laterality: N/A;  loop    There were no vitals filed for this visit.   Subjective Assessment - 09/15/20 1104    Subjective Going to see the neurologist tomorrow to get an EEG. Feels like her dizziness is improving.    Patient is accompained by: Family member    Pertinent History CAD, hx of CVA, PAF s/p DCCV, BPPV, a-fib, HLD    Patient Stated Goals Pt would like to be able to live normally with moving and walking easier.    Currently in Pain? No/denies              [facility] PT Assessment - 09/15/20 1113      Timed Up and Go Test   TUG Normal TUG    Normal TUG (seconds) 15.06    TUG Comments no AD  - incr dizziness to 8/10 after doing turn to sit back down in chair (performed too quickly)- decr with longer seated rest break                         [facility] Adult PT Treatment/Exercise - 09/15/20 1113      Ambulation/Gait   Ambulation/Gait Yes    Ambulation/Gait Assistance  5: Supervision    Ambulation/Gait Assistance Details conversing with therapist throughout gait, cues to relax arms for more arm swing    Ambulation Distance (Feet) 550 Feet    Assistive device None    Gait Pattern Step-through pattern    Ambulation Surface Level;Indoor      Neuro Re-ed    Neuro Re-ed Details  working on 1/4 turns: x5 reps to R and back to midline and repeated x5 reps going to L, cues for incr foot clearance when stepping and leading with outside leg, pt reporting mild sx/dizziness from 2-3/10, with cues to let sx subside before stepping back to starting point, added to pt's HEP for pt to perform at the [place].           Vestibular Treatment/Exercise - 09/15/20 0001      Vestibular Treatment/Exercise   Vestibular Treatment  Provided Gaze    Habituation Exercises --    Gaze Exercises X1 Viewing Horizontal;X1 Viewing Vertical      X1 Viewing Horizontal   Foot Position seated    Reps 3    Comments x30 seconds - rating sx as 4-5/10 that subside with rest. educated when performing at home to wait for sx to subside before performing another rep as pt reports that she performs the next set too quickly      X1 Viewing Vertical   Foot Position seated    Reps 2    Comments x30 seconds - 1-2/10 dizziness - cues to slightly incr speed, x1 rep performing 60 seconds with 2/10 afterwards               Access Code: [email or web address] URL: [email or web address] Date: 09/15/2020 Prepared by: Janann August  New addition to HEP below on 09/15/20:  Exercises Standing on foam eyes open - 2 x daily - 5 x weekly - 1 sets - 2 reps - 30 sec hold Standing head turns - 2 x daily - 5 x weekly - 2 sets - 10 reps Romberg Stance with Head Nods - 2 x daily - 5 x weekly - 2 sets - 10 reps Standing Marching - 2 x daily - 5 x weekly - 2 sets - 10 reps Standing Quarter Turn - 2 x daily - 5 x weekly - 1 sets - 4-5 reps  PT Education - 09/15/20 1348    Education Details progress towards goals, 1/4 turns added to pt's HEP    Person(s) Educated Patient    Methods Explanation;Demonstration;Handout;Verbal cues    Comprehension Verbalized understanding;Returned demonstration            PT Short Term Goals - 09/15/20 1112      PT SHORT TERM GOAL #1   Title Pt will decrease TUG from 14.67 sec to <12 sec for improved balance and functional mobility.    Baseline 08/18/20 14.67 sec; 15.06 seconds on 09/15/20 with no AD    Time 4    Period Weeks    Status Not Met    Target Date 09/18/20      PT SHORT TERM GOAL #2   Title Pt will increase FGA from 16 to >19/30 for improved balance and gait safety.    Baseline 08/18/20 16/30    Time 4    Period Weeks    Status New    Target Date 09/18/20      PT SHORT TERM GOAL #3   Title  Pt will ambulate >500' without AD supervision on level surfaces.    Baseline met on  09/15/20    Time 4    Period Weeks    Status Achieved    Target Date 09/18/20             PT Long Term Goals - 08/19/20 0912      PT LONG TERM GOAL #1   Title Pt will be independent with progressive HEP for strength, balance and functional mobility to continue gains on own.    Time 8    Period Weeks    Status New    Target Date 10/18/20      PT LONG TERM GOAL #2   Title Pt will increase gait speed to >0.75ms without AD for improved gait safety.    Baseline 08/18/20 0.516m without AD    Time 8    Period Weeks    Status New    Target Date 10/18/20      PT LONG TERM GOAL #3   Title Pt will ambulate >800' on varied surfaces with LRAD mod I for improved community mobility.    Time 8    Period Weeks    Status New    Target Date 10/18/20      PT LONG TERM GOAL #4   Title Pt will increase FGA from 16 to >22/30 for improved balance and gait safety.    Baseline 08/18/20 16/30    Time 8    Period Weeks    Status New    Target Date 10/18/20                 Plan - 09/15/20 1401    Clinical Impression Statement Began to assess pt's STGs today. Pt met STG #3 - able to ambulate longer distances with no AD over level surfaces with supervision, able to carry on conversation with therapist throughout. Did not meet STG #1 in regards to TUG - performed in 15.06 seconds today (previously 14.67 seconds) - when performing turn to sit back down to chair, pt reporting 8/10 dizziness with pt needing more prolonged seated rest breaks with sx subsided. Added quarter turns to pt's HEP with pt able to tolerate well with mild sx that would subside. Will continue to progress towards LTGs.    Personal Factors and Comorbidities Age;Comorbidity 3+    Comorbidities CAD, hx of CVA, PAF s/p DCCV, BPPV, a-fib, HLD, seizure    Examination-Activity Limitations  Bend;Bathing;Carry;Stand;Squat;Stairs;Toileting;Dressing;Transfers;Locomotion Level    Examination-Participation Restrictions Church;Yard Work;Driving;Community Activity;Laundry;Meal Prep;Cleaning    Stability/Clinical Decision Making Evolving/Moderate complexity    Rehab Potential Good    PT Frequency 2x / week    PT Duration 8 weeks    PT Treatment/Interventions ADLs/Self Care Home Management;Aquatic Therapy;Biofeedback;Canalith Repostioning;Balance training;Therapeutic exercise;Manual techniques;Vestibular;Therapeutic activities;Functional mobility training;Stair training;Gait training;Patient/family education;DME Instruction;Neuromuscular re-education    PT Next Visit Plan will need 10th visit PN. continue checking STGs. Monitor BP. Has pacemaker set at 8069work on turns. Continue gait w/o AD working on increasing step length, high level balance, functional strengthening/endurance. Continue VOR x 1, progress time. Habituation to Head Turns/Nods. Potential to add in Br[place] to symptoms with sidelying to sit.    Consulted and Agree with Plan of Care Patient;Family member/caregiver    Family Member Consulted pt's husband: [name]           Patient will benefit from skilled therapeutic intervention in order to improve the following deficits and impairments:  Abnormal gait,Decreased endurance,Impaired sensation,Cardiopulmonary status limiting activity,Decreased knowledge of use of DME,Decreased activity tolerance,Decreased strength,Decreased balance,Decreased mobility,Dizziness,Impaired flexibility  Visit Diagnosis: Other  abnormalities of gait and mobility  Unsteadiness on feet  Muscle weakness (generalized)  Dizziness and giddiness     Problem List Patient Active Problem List   Diagnosis Date Noted  . A-fib ([place]) 07/06/2020  . Pacemaker - MDT 07/06/2020  . New onset seizure ([place]) 06/28/2020  . 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  . Hemiplegia and hemiparesis  following cerebral infarction affecting left non-dominant side ([place]) 02/05/2020  . 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 CVA (cerebral vascular accident) ([place]) 01/09/2020  . Dizziness 11/30/2018  . Paroxysmal atrial fibrillation ([place]) 08/13/2018  . Prediabetes 06/15/2018  . Palpitations 06/05/2018  . Essential hypertension 06/05/2018  . Hyperlipidemia LDL goal <70 06/05/2018  . Acute ischemic right MCA stroke ([place]) 06/05/2018  . Stroke (cerebrum) ([place]) - R MCA s/p tPA, embolic, source unknown 44/39/2659  . SVT (supraventricular tachycardia) ([place]) 05/28/2015  . PVC's (premature ventricular contractions) 05/28/2015  . BENIGN POSITIONAL VERTIGO 06/16/2009  . ALLERGIC RHINITIS 06/16/2009    Arliss Journey, PT, DPT  09/15/2020, 2:03 PM  [facility] [address] [place], [place], [address] Phone: [phone]   Fax:  [phone]  Name: Violett Hobbs MRN: [id number] Date of Birth: [birth year]

## 2020-09-16 ENCOUNTER — Ambulatory Visit (INDEPENDENT_AMBULATORY_CARE_PROVIDER_SITE_OTHER): Payer: Medicare Other

## 2020-09-16 DIAGNOSIS — G40909 Epilepsy, unspecified, not intractable, without status epilepticus: Secondary | ICD-10-CM

## 2020-09-17 ENCOUNTER — Ambulatory Visit: Payer: Medicare Other

## 2020-09-17 ENCOUNTER — Other Ambulatory Visit: Payer: Self-pay

## 2020-09-17 DIAGNOSIS — R42 Dizziness and giddiness: Secondary | ICD-10-CM | POA: Diagnosis not present

## 2020-09-17 DIAGNOSIS — M6281 Muscle weakness (generalized): Secondary | ICD-10-CM | POA: Diagnosis not present

## 2020-09-17 DIAGNOSIS — R2689 Other abnormalities of gait and mobility: Secondary | ICD-10-CM

## 2020-09-17 DIAGNOSIS — R2681 Unsteadiness on feet: Secondary | ICD-10-CM

## 2020-09-17 NOTE — Therapy (Signed)
[facility] [address] [place], [place], [address] Phone: [phone]   Fax:  [phone]  Physical Therapy Treatment/ Progress note  Patient Details  Name: Mary Mercado MRN: [id number] Date of Birth: Aug 24, 1940 Referring Provider (PT): [name]    Progress Note  Reporting period 08/18/20 to 09/17/20  See Note below for Objective Data and Assessment of Progress/Goals  Encounter Date: 09/17/2020   PT End of Session - 09/17/20 1148    Visit Number 10    Number of Visits 17    Date for PT Re-Evaluation 11/16/20   60 day poc, 90 day cert   Authorization Type Medicare so 10th visit progress note    PT Start Time 1146    PT Stop Time 1230    PT Time Calculation (min) 44 min    Equipment Utilized During Treatment Gait belt    Activity Tolerance Patient tolerated treatment well    Behavior During Therapy W[facility] for tasks assessed/performed           Past Medical History:  Diagnosis Date  . Allergy   . CVA (cerebral vascular accident) ([place]) 05/2018, 01/09/20  . Hyperlipidemia LDL goal <70   . Hypertension   . Palpitations   . Vertigo, benign positional     Past Surgical History:  Procedure Laterality Date  . AV NODE ABLATION N/A 03/30/2020   Procedure: AV NODE ABLATION;  Surgeon: Deboraha Sprang, MD;  Location: [facility];  Service: Cardiovascular;  Laterality: N/A;  . AV NODE ABLATION N/A 03/30/2020   Procedure: AV NODE ABLATION;  Surgeon: Deboraha Sprang, MD;  Location: [facility];  Service: Cardiovascular;  Laterality: N/A;  . CARDIOVERSION Left 10/31/2018   Procedure: CARDIOVERSION;  Surgeon: Thayer Headings, MD;  Location: [facility];  Service: Cardiovascular;  Laterality: Left;  . CARDIOVERSION N/A 01/08/2020   Procedure: CARDIOVERSION;  Surgeon: [name], MD;  Location: [facility] ENDOSCOPY;  Service: Cardiovascular;  Laterality: N/A;  . Hemilaminectomy and microdiskectomy  at L4-5 on the left.  12/10/2003  . PACEMAKER IMPLANT N/A 03/30/2020   Procedure: PACEMAKER IMPLANT;  Surgeon: Deboraha Sprang, MD;  Location: [employer] [facility];  Service: Cardiovascular;  Laterality: N/A;  . TEE WITHOUT CARDIOVERSION N/A 06/05/2018   Procedure: TRANSESOPHAGEAL ECHOCARDIOGRAM (TEE);  Surgeon: Lelon Perla, MD;  Location: [facility] ENDOSCOPY;  Service: Cardiovascular;  Laterality: N/A;  loop    There were no vitals filed for this visit.   Subjective Assessment - 09/17/20 1149    Subjective Pt reports that she had her EEG yesterday and it was a good experience. The guy doing it also gave her a new mask for her sleep apnea machine which was more comfortable.    Patient is accompained by: Family member    Pertinent History CAD, hx of CVA, PAF s/p DCCV, BPPV, a-fib, HLD    Patient Stated Goals Pt would like to be able to live normally with moving and walking easier.    Currently in Pain? No/denies              [facility] PT Assessment - 09/17/20 1152      Functional Gait  Assessment   Gait assessed  Yes    Gait Level Surface Walks 20 ft, slow speed, abnormal gait pattern, evidence for imbalance or deviates 10-15 in outside of the 12 in walkway width. Requires more than 7 sec to ambulate 20 ft.    Change in Gait Speed  Able to change speed, demonstrates mild gait deviations, deviates 6-10 in outside of the 12 in walkway width, or no gait deviations, unable to achieve a major change in velocity, or uses a change in velocity, or uses an assistive device.    Gait with Horizontal Head Turns Performs head turns smoothly with slight change in gait velocity (eg, minor disruption to smooth gait path), deviates 6-10 in outside 12 in walkway width, or uses an assistive device.    Gait with Vertical Head Turns Performs task with slight change in gait velocity (eg, minor disruption to smooth gait path), deviates 6 - 10 in outside 12 in walkway width or uses assistive device    Gait and Pivot Turn  Pivot turns safely in greater than 3 sec and stops with no loss of balance, or pivot turns safely within 3 sec and stops with mild imbalance, requires small steps to catch balance.    Step Over Obstacle Is able to step over one shoe box (4.5 in total height) without changing gait speed. No evidence of imbalance.    Gait with Narrow Base of Support Is able to ambulate for 10 steps heel to toe with no staggering.    Gait with Eyes Closed Cannot walk 20 ft without assistance, severe gait deviations or imbalance, deviates greater than 15 in outside 12 in walkway width or will not attempt task.    Ambulating Backwards Walks 20 ft, slow speed, abnormal gait pattern, evidence for imbalance, deviates 10-15 in outside 12 in walkway width.    Steps Alternating feet, must use rail.    Total Score 17                         [facility] Adult PT Treatment/Exercise - 09/17/20 1152      Ambulation/Gait   Ambulation/Gait Yes    Ambulation/Gait Assistance 5: Supervision    Ambulation/Gait Assistance Details around in clinic between activities    Assistive device None    Gait Pattern Step-through pattern;Decreased step length - right;Decreased step length - left;Decreased arm swing - right;Decreased arm swing - left    Ambulation Surface Level;Indoor    Stairs Yes    Stairs Assistance 5: Supervision    Stairs Assistance Details (indicate cue type and reason) first 8 with bilateral rails in reciprocal pattern, last 4 with right rail only holding with both hands. At home pt backs down steps.    Stair Management Technique Two rails;One rail Right;Alternating pattern    Number of Stairs 12    Height of Stairs 6      Neuro Re-ed    Neuro Re-ed Details  Dynamic gait activities in hallway: 40' x 4 with head turns in varied directions on command CGA. Pt was cued during last 2 bouts to try to increase step length. 40' x 2 with marching gait. Pt fatigued as went on but was able to control steps more with cues  to take her time and be gentle. Practiced quarter turns to left or right on command x 6. Had pt try to focus her eyes on a target. 2/10 dizziness. Switched to 180 degree turns x 5 initially 7/10 then decreased to 5/10 dizziness when she focused on target more.                  PT Education - 09/17/20 1813    Education Details Discussed result of FGA.    Person(s) Educated Patient    Methods Explanation  Comprehension Verbalized understanding            PT Short Term Goals - 09/17/20 1205      PT SHORT TERM GOAL #1   Title Pt will decrease TUG from 14.67 sec to <12 sec for improved balance and functional mobility.    Baseline 08/18/20 14.67 sec; 15.06 seconds on 09/15/20 with no AD    Time 4    Period Weeks    Status Not Met    Target Date 09/18/20      PT SHORT TERM GOAL #2   Title Pt will increase FGA from 16 to >19/30 for improved balance and gait safety.    Baseline 08/18/20 16/30. 09/17/20 17/30    Time 4    Period Weeks    Status Not Met    Target Date 09/18/20      PT SHORT TERM GOAL #3   Title Pt will ambulate >500' without AD supervision on level surfaces.    Baseline met on 09/15/20    Time 4    Period Weeks    Status Achieved    Target Date 09/18/20             PT Long Term Goals - 08/19/20 0912      PT LONG TERM GOAL #1   Title Pt will be independent with progressive HEP for strength, balance and functional mobility to continue gains on own.    Time 8    Period Weeks    Status New    Target Date 10/18/20      PT LONG TERM GOAL #2   Title Pt will increase gait speed to >0.21ms without AD for improved gait safety.    Baseline 08/18/20 0.522m without AD    Time 8    Period Weeks    Status New    Target Date 10/18/20      PT LONG TERM GOAL #3   Title Pt will ambulate >800' on varied surfaces with LRAD mod I for improved community mobility.    Time 8    Period Weeks    Status New    Target Date 10/18/20      PT LONG TERM GOAL #4   Title  Pt will increase FGA from 16 to >22/30 for improved balance and gait safety.    Baseline 08/18/20 16/30    Time 8    Period Weeks    Status New    Target Date 10/18/20                 Plan - 09/17/20 1814    Clinical Impression Statement PT reassessed last STG today. Pt improved 1 point on FGA but not to goal. She is showing better stability with head turns with gait with less staggering. PT continued to work on turning activities as this is when she feels most dizzy/unsteady. Focusing on targets with turning did help. Pt continues to benefit from skilled PT.    Personal Factors and Comorbidities Age;Comorbidity 3+    Comorbidities CAD, hx of CVA, PAF s/p DCCV, BPPV, a-fib, HLD, seizure    Examination-Activity Limitations Bend;Bathing;Carry;Stand;Squat;Stairs;Toileting;Dressing;Transfers;Locomotion Level    Examination-Participation Restrictions Church;Yard Work;Driving;Community Activity;Laundry;Meal Prep;Cleaning    Stability/Clinical Decision Making Evolving/Moderate complexity    Rehab Potential Good    PT Frequency 2x / week    PT Duration 8 weeks    PT Treatment/Interventions ADLs/Self Care Home Management;Aquatic Therapy;Biofeedback;Canalith Repostioning;Balance training;Therapeutic exercise;Manual techniques;Vestibular;Therapeutic activities;Functional mobility training;Stair training;Gait training;Patient/family education;DME Instruction;Neuromuscular re-education  PT Next Visit Plan Monitor BP. Has pacemaker set at 37. work on turns. Continue gait w/o AD working on increasing step length, high level balance, functional strengthening/endurance. Continue VOR x 1, progress time. Habituation to Head Turns/Nods. Continue to work on turning activities with 1/4 and 180 degree turns.    Consulted and Agree with Plan of Care Patient;Family member/caregiver    Family Member Consulted pt's husband: [name]           Patient will benefit from skilled therapeutic intervention in order  to improve the following deficits and impairments:  Abnormal gait,Decreased endurance,Impaired sensation,Cardiopulmonary status limiting activity,Decreased knowledge of use of DME,Decreased activity tolerance,Decreased strength,Decreased balance,Decreased mobility,Dizziness,Impaired flexibility  Visit Diagnosis: Other abnormalities of gait and mobility  Unsteadiness on feet     Problem List Patient Active Problem List   Diagnosis Date Noted  . A-fib ([place]) 07/06/2020  . Pacemaker - MDT 07/06/2020  . New onset seizure ([place]) 06/28/2020  . 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  . Hemiplegia and hemiparesis following cerebral infarction affecting left non-dominant side ([place]) 02/05/2020  . 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 CVA (cerebral vascular accident) ([place]) 01/09/2020  . Dizziness 11/30/2018  . Paroxysmal atrial fibrillation ([place]) 08/13/2018  . Prediabetes 06/15/2018  . Palpitations 06/05/2018  . Essential hypertension 06/05/2018  . Hyperlipidemia LDL goal <70 06/05/2018  . Acute ischemic right MCA stroke ([place]) 06/05/2018  . Stroke (cerebrum) ([place]) - R MCA s/p tPA, embolic, source unknown 99/96/7227  . SVT (supraventricular tachycardia) ([place]) 05/28/2015  . PVC's (premature ventricular contractions) 05/28/2015  . BENIGN POSITIONAL VERTIGO 06/16/2009  . ALLERGIC RHINITIS 06/16/2009    Electa Sniff, PT, DPT, NCS 09/17/2020, 6:18 PM  [facility] [address] [place], [place], [address] Phone: [phone]   Fax:  [phone]  Name: Mary Mercado MRN: [id number] Date of Birth: [birth year]

## 2020-09-22 ENCOUNTER — Ambulatory Visit: Payer: Medicare Other

## 2020-09-22 ENCOUNTER — Other Ambulatory Visit: Payer: Self-pay

## 2020-09-22 VITALS — BP 140/88 | HR 80

## 2020-09-22 DIAGNOSIS — R42 Dizziness and giddiness: Secondary | ICD-10-CM | POA: Diagnosis not present

## 2020-09-22 DIAGNOSIS — R2689 Other abnormalities of gait and mobility: Secondary | ICD-10-CM | POA: Diagnosis not present

## 2020-09-22 DIAGNOSIS — R2681 Unsteadiness on feet: Secondary | ICD-10-CM | POA: Diagnosis not present

## 2020-09-22 DIAGNOSIS — M6281 Muscle weakness (generalized): Secondary | ICD-10-CM | POA: Diagnosis not present

## 2020-09-22 NOTE — Therapy (Signed)
[facility] [address] [place], [place], [address] Phone: [phone]   Fax:  [phone]  Physical Therapy Treatment  Patient Details  Name: Mary Mercado MRN: [id number] Date of Birth: 17-Dec-1940 Referring Provider (PT): [name]   Encounter Date: 09/22/2020   PT End of Session - 09/22/20 1145    Visit Number 11    Number of Visits 17    Date for PT Re-Evaluation 11/16/20   60 day poc, 90 day cert   Authorization Type Medicare so 10th visit progress note    PT Start Time 3295    PT Stop Time 1230    PT Time Calculation (min) 45 min    Equipment Utilized During Treatment Gait belt    Activity Tolerance Patient tolerated treatment well    Behavior During Therapy [facility] for tasks assessed/performed           Past Medical History:  Diagnosis Date  . Allergy   . CVA (cerebral vascular accident) ([place]) 05/2018, 01/09/20  . Hyperlipidemia LDL goal <70   . Hypertension   . Palpitations   . Vertigo, benign positional     Past Surgical History:  Procedure Laterality Date  . AV NODE ABLATION N/A 03/30/2020   Procedure: AV NODE ABLATION;  Surgeon: Deboraha Sprang, MD;  Location: [facility];  Service: Cardiovascular;  Laterality: N/A;  . AV NODE ABLATION N/A 03/30/2020   Procedure: AV NODE ABLATION;  Surgeon: Deboraha Sprang, MD;  Location: [facility];  Service: Cardiovascular;  Laterality: N/A;  . CARDIOVERSION Left 10/31/2018   Procedure: CARDIOVERSION;  Surgeon: Thayer Headings, MD;  Location: [facility];  Service: Cardiovascular;  Laterality: Left;  . CARDIOVERSION N/A 01/08/2020   Procedure: CARDIOVERSION;  Surgeon: [name], MD;  Location: [facility];  Service: Cardiovascular;  Laterality: N/A;  . Hemilaminectomy and microdiskectomy at L4-5 on the left.  12/10/2003  . PACEMAKER IMPLANT N/A 03/30/2020   Procedure: PACEMAKER IMPLANT;  Surgeon: Deboraha Sprang, MD;   Location: [facility];  Service: Cardiovascular;  Laterality: N/A;  . TEE WITHOUT CARDIOVERSION N/A 06/05/2018   Procedure: TRANSESOPHAGEAL ECHOCARDIOGRAM (TEE);  Surgeon: Lelon Perla, MD;  Location: [facility] ENDOSCOPY;  Service: Cardiovascular;  Laterality: N/A;  loop    Vitals:   09/22/20 1151  BP: 140/88  Pulse: 80     Subjective Assessment - 09/22/20 1149    Subjective Patient reports no new changes/complaints. Denies falls.    Patient is accompained by: Family member    Pertinent History CAD, hx of CVA, PAF s/p DCCV, BPPV, a-fib, HLD    Patient Stated Goals Pt would like to be able to live normally with moving and walking easier.    Currently in Pain? No/denies             [facility] Adult PT Treatment/Exercise - 09/22/20 0001      Ambulation/Gait   Ambulation/Gait Yes    Ambulation/Gait Assistance 5: Supervision;4: Min guard    Ambulation/Gait Assistance Details completed ambulation outdoors x 600 ft on paved and grass surfaces. Supervision on paved surface, but required CGA on grass surfaces due to imbalance. patient had difficulty maintaining conversation on grass surfaces due to increased concentration to task required. SOB at end of completion 6/10. Required extended seated rest break after completion.    Ambulation Distance (Feet) 600 Feet    Assistive device None    Gait Pattern Step-through pattern;Decreased step length - right;Decreased step  length - left;Decreased arm swing - right;Decreased arm swing - left    Ambulation Surface Level;Indoor;Unlevel;Outdoor;Paved      Neuro Re-ed    Neuro Re-ed Details  Completed dynamic gait with addition of 180 deg turns around cones x 4 reps, then progressed to sudden turns with ambulation upon PT command completed x 200 ft. Increased challenge at end due to dizziness reports.               Balance Exercises - 09/22/20 0001      Balance Exercises: Standing   Stepping Strategy Anterior;Foam/compliant  surface;Limitations    Stepping Strategy Limitations completed anterior stepping strategy off red balance beam without UE support x 15 reps bialterally, incrased challenge intemrittently requiring CGA from PT. RPE at end of completion 4/10.    Step Ups Forward;6 inch;Limitations    Step Ups Limitations standing with BLE on airex completed alternating step ups on 4" step without UE support x 10 reps bilaterally.    Balance Beam standing across red balance beam completed static standing holding steady x 1 minute, then progressed to horizontal/vertical head turns x 10 reps. increased balance challenge with horizontal today.             PT Education - 09/22/20 1238    Education Details PT educating on walking program outdoors    Person(s) Educated Spouse;Patient    Methods Explanation    Comprehension Verbalized understanding            PT Short Term Goals - 09/17/20 1205      PT SHORT TERM GOAL #1   Title Pt will decrease TUG from 14.67 sec to <12 sec for improved balance and functional mobility.    Baseline 08/18/20 14.67 sec; 15.06 seconds on 09/15/20 with no AD    Time 4    Period Weeks    Status Not Met    Target Date 09/18/20      PT SHORT TERM GOAL #2   Title Pt will increase FGA from 16 to >19/30 for improved balance and gait safety.    Baseline 08/18/20 16/30. 09/17/20 17/30    Time 4    Period Weeks    Status Not Met    Target Date 09/18/20      PT SHORT TERM GOAL #3   Title Pt will ambulate >500' without AD supervision on level surfaces.    Baseline met on 09/15/20    Time 4    Period Weeks    Status Achieved    Target Date 09/18/20             PT Long Term Goals - 08/19/20 0912      PT LONG TERM GOAL #1   Title Pt will be independent with progressive HEP for strength, balance and functional mobility to continue gains on own.    Time 8    Period Weeks    Status New    Target Date 10/18/20      PT LONG TERM GOAL #2   Title Pt will increase gait speed to  >0.92ms without AD for improved gait safety.    Baseline 08/18/20 0.566m without AD    Time 8    Period Weeks    Status New    Target Date 10/18/20      PT LONG TERM GOAL #3   Title Pt will ambulate >800' on varied surfaces with LRAD mod I for improved community mobility.    Time 8    Period  Weeks    Status New    Target Date 10/18/20      PT LONG TERM GOAL #4   Title Pt will increase FGA from 16 to >22/30 for improved balance and gait safety.    Baseline 08/18/20 16/30    Time 8    Period Weeks    Status New    Target Date 10/18/20                 Plan - 09/22/20 1240    Clinical Impression Statement Completed gait outdoors on paved and grass surface, CGA required on grass due to increased balance challenge. Patient able to ambualte x 600 ft but require seated rest break after. Continued education on walking program to further promote endurance. Rest of session conintued to work on balance activites with incorporation of 180 deg turns and head turns. Will continue to progress toward all LTGs.    Personal Factors and Comorbidities Age;Comorbidity 3+    Comorbidities CAD, hx of CVA, PAF s/p DCCV, BPPV, a-fib, HLD, seizure    Examination-Activity Limitations Bend;Bathing;Carry;Stand;Squat;Stairs;Toileting;Dressing;Transfers;Locomotion Level    Examination-Participation Restrictions Church;Yard Work;Driving;Community Activity;Laundry;Meal Prep;Cleaning    Stability/Clinical Decision Making Evolving/Moderate complexity    Rehab Potential Good    PT Frequency 2x / week    PT Duration 8 weeks    PT Treatment/Interventions ADLs/Self Care Home Management;Aquatic Therapy;Biofeedback;Canalith Repostioning;Balance training;Therapeutic exercise;Manual techniques;Vestibular;Therapeutic activities;Functional mobility training;Stair training;Gait training;Patient/family education;DME Instruction;Neuromuscular re-education    PT Next Visit Plan Monitor BP. Has pacemaker set at 41. Did we try  walking program? Continue gait w/o AD working on increasing step length, high level balance, functional strengthening/endurance. Continue VOR x 1, progress time. Habituation to Head Turns/Nods. Continue to work on turning activities with 1/4 and 180 degree turns.    Consulted and Agree with Plan of Care Patient;Family member/caregiver    Family Member Consulted pt's husband: [name]           Patient will benefit from skilled therapeutic intervention in order to improve the following deficits and impairments:  Abnormal gait,Decreased endurance,Impaired sensation,Cardiopulmonary status limiting activity,Decreased knowledge of use of DME,Decreased activity tolerance,Decreased strength,Decreased balance,Decreased mobility,Dizziness,Impaired flexibility  Visit Diagnosis: Other abnormalities of gait and mobility  Unsteadiness on feet     Problem List Patient Active Problem List   Diagnosis Date Noted  . A-fib ([place]) 07/06/2020  . Pacemaker - MDT 07/06/2020  . New onset seizure ([place]) 06/28/2020  . 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  . Hemiplegia and hemiparesis following cerebral infarction affecting left non-dominant side ([place]) 02/05/2020  . 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 CVA (cerebral vascular accident) ([place]) 01/09/2020  . Dizziness 11/30/2018  . Paroxysmal atrial fibrillation ([place]) 08/13/2018  . Prediabetes 06/15/2018  . Palpitations 06/05/2018  . Essential hypertension 06/05/2018  . Hyperlipidemia LDL goal <70 06/05/2018  . Acute ischemic right MCA stroke ([place]) 06/05/2018  . Stroke (cerebrum) ([place]) - R MCA s/p tPA, embolic, source unknown 57/84/6962  . SVT (supraventricular tachycardia) ([place]) 05/28/2015  . PVC's (premature ventricular contractions) 05/28/2015  . BENIGN POSITIONAL VERTIGO 06/16/2009  . ALLERGIC RHINITIS 06/16/2009    Jones Bales, PT, DPT 09/22/2020, 12:43 PM  [facility] [address] [place], [place], [address] Phone: [phone]   Fax:  [phone]  Name: Mary Mercado MRN: [id number] Date of Birth: 05-Feb-1941

## 2020-09-24 ENCOUNTER — Ambulatory Visit: Payer: Medicare Other | Attending: Physical Medicine & Rehabilitation

## 2020-09-24 ENCOUNTER — Other Ambulatory Visit: Payer: Self-pay

## 2020-09-24 VITALS — HR 80

## 2020-09-24 DIAGNOSIS — M6281 Muscle weakness (generalized): Secondary | ICD-10-CM | POA: Diagnosis not present

## 2020-09-24 DIAGNOSIS — R2689 Other abnormalities of gait and mobility: Secondary | ICD-10-CM | POA: Diagnosis not present

## 2020-09-24 DIAGNOSIS — R42 Dizziness and giddiness: Secondary | ICD-10-CM | POA: Diagnosis not present

## 2020-09-24 DIAGNOSIS — R2681 Unsteadiness on feet: Secondary | ICD-10-CM | POA: Diagnosis not present

## 2020-09-24 NOTE — Therapy (Signed)
[facility] [address] [place], [place], [address] Phone: [phone]   Fax:  [phone]  Physical Therapy Treatment  Patient Details  Name: Mary Mercado MRN: [id number] Date of Birth: 01/19/1941 Referring Provider (PT): Andrew Kirsteins   Encounter Date: 09/24/2020   PT End of Session - 09/24/20 1148    Visit Number 12    Number of Visits 17    Date for PT Re-Evaluation 11/16/20   60 day poc, 90 day cert   Authorization Type Medicare so 10th visit progress note    PT Start Time 1146    PT Stop Time 1229    PT Time Calculation (min) 43 min    Equipment Utilized During Treatment Gait belt    Activity Tolerance Patient tolerated treatment well    Behavior During Therapy WFL for tasks assessed/performed           Past Medical History:  Diagnosis Date  . Allergy   . CVA (cerebral vascular accident) ([facility]) 05/2018, 01/09/20  . Hyperlipidemia LDL goal <70   . Hypertension   . Palpitations   . Vertigo, benign positional     Past Surgical History:  Procedure Laterality Date  . AV NODE ABLATION N/A 03/30/2020   Procedure: AV NODE ABLATION;  Surgeon: Klein, Steven C, MD;  Location: [facility];  Service: Cardiovascular;  Laterality: N/A;  . AV NODE ABLATION N/A 03/30/2020   Procedure: AV NODE ABLATION;  Surgeon: Klein, Steven C, MD;  Location: [facility];  Service: Cardiovascular;  Laterality: N/A;  . CARDIOVERSION Left 10/31/2018   Procedure: CARDIOVERSION;  Surgeon: Nahser, Philip J, MD;  Location: [facility];  Service: Cardiovascular;  Laterality: Left;  . CARDIOVERSION N/A 01/08/2020   Procedure: CARDIOVERSION;  Surgeon: Acharya, Gayatri A, MD;  Location: [facility];  Service: Cardiovascular;  Laterality: N/A;  . Hemilaminectomy and microdiskectomy at L4-5 on the left.  12/10/2003  . PACEMAKER IMPLANT N/A 03/30/2020   Procedure: PACEMAKER IMPLANT;  Surgeon: Klein, Steven C, MD;   [facility];  Service: Cardiovascular;  Laterality: N/A;  . TEE WITHOUT CARDIOVERSION N/A 06/05/2018   Procedure: TRANSESOPHAGEAL ECHOCARDIOGRAM (TEE);  Surgeon: Crenshaw, Brian S, MD;  Location: [facility];  Service: Cardiovascular;  Laterality: N/A;  loop    Vitals:   09/24/20 1154  Pulse: 80     Subjective Assessment - 09/24/20 1148    Subjective Pt reports that it is hot outside. She was feeling good until walking in from outside. Pt has been busy working on getting things ready for their move. She has not necessarily worked on walking program with timing it but she is walking more in home.    Patient is accompained by: Family member    Pertinent History CAD, hx of CVA, PAF s/p DCCV, BPPV, a-fib, HLD    Patient Stated Goals Pt would like to be able to live normally with moving and walking easier.    Currently in Pain? No/denies                             [facility] Adult PT Treatment/Exercise - 09/24/20 1150      Ambulation/Gait   Ambulation/Gait Yes    Ambulation/Gait Assistance 5: Supervision    Ambulation/Gait Assistance Details Pt ambulated 3 min 17 sec during walk. RPE= 8-9/10. Pt reports that she just feels more winded today with wearing her N95. Switches to regular mask after.      Ambulation Distance (Feet) 345 Feet    Assistive device None    Gait Pattern Step-through pattern;Decreased step length - right;Decreased step length - left    Ambulation Surface Level;Indoor    Stairs Yes    Stairs Assistance 5: Supervision    Stairs Assistance Details (indicate cue type and reason) Verbal cues to look down at feet with descent so she knows where she is placing feet.    Stair Management Technique One rail Right;Alternating pattern    Number of Stairs 8    Gait Comments RPE=5-6/10 prior to gait today      Neuro Re-ed    Neuro Re-ed Details  Dynamic gait activities in hallway: gait with head turns left/right on command 40' x 4. Gait with 180  degree turns on command x ~6 min with cues to focus on target when turning. Pt reports that she was not dizzy everytime and the worst it was was 7/10 and resided quickly. Standing at bottom of steps on airex: tapping 2nd step without UE support x 10, Standing with 1 foot on airex and one on 2nd step with head turns left/right x 10 then repeated with other foot up. Pt had slight increased sway with RLE posterior. Was given verbal cues to try to relax trunk and only turn head.                    PT Short Term Goals - 09/17/20 1205      PT SHORT TERM GOAL #1   Title Pt will decrease TUG from 14.67 sec to <12 sec for improved balance and functional mobility.    Baseline 08/18/20 14.67 sec; 15.06 seconds on 09/15/20 with no AD    Time 4    Period Weeks    Status Not Met    Target Date 09/18/20      PT SHORT TERM GOAL #2   Title Pt will increase FGA from 16 to >19/30 for improved balance and gait safety.    Baseline 08/18/20 16/30. 09/17/20 17/30    Time 4    Period Weeks    Status Not Met    Target Date 09/18/20      PT SHORT TERM GOAL #3   Title Pt will ambulate >500' without AD supervision on level surfaces.    Baseline met on 09/15/20    Time 4    Period Weeks    Status Achieved    Target Date 09/18/20             PT Long Term Goals - 08/19/20 0912      PT LONG TERM GOAL #1   Title Pt will be independent with progressive HEP for strength, balance and functional mobility to continue gains on own.    Time 8    Period Weeks    Status New    Target Date 10/18/20      PT LONG TERM GOAL #2   Title Pt will increase gait speed to >0.67ms without AD for improved gait safety.    Baseline 08/18/20 0.59m without AD    Time 8    Period Weeks    Status New    Target Date 10/18/20      PT LONG TERM GOAL #3   Title Pt will ambulate >800' on varied surfaces with LRAD mod I for improved community mobility.    Time 8    Period Weeks    Status New    Target Date 10/18/20  PT LONG TERM GOAL #4   Title Pt will increase FGA from 16 to >22/30 for improved balance and gait safety.    Baseline 08/18/20 16/30    Time 8    Period Weeks    Status New    Target Date 10/18/20                 Plan - 09/24/20 1255    Clinical Impression Statement Pt was not able to increase gait distance today and PT focused more on time walking with completing 3 min 17 sec before needing to rest. Was wearing N95 initially and felt like it was making her more SOB. PT continued to focus on gait with head turns and 180 degree turns with pt reporting less dizzy bouts that resided quickly.    Personal Factors and Comorbidities Age;Comorbidity 3+    Comorbidities CAD, hx of CVA, PAF s/p DCCV, BPPV, a-fib, HLD, seizure    Examination-Activity Limitations Bend;Bathing;Carry;Stand;Squat;Stairs;Toileting;Dressing;Transfers;Locomotion Level    Examination-Participation Restrictions Church;Yard Work;Driving;Community Activity;Laundry;Meal Prep;Cleaning    Stability/Clinical Decision Making Evolving/Moderate complexity    Rehab Potential Good    PT Frequency 2x / week    PT Duration 8 weeks    PT Treatment/Interventions ADLs/Self Care Home Management;Aquatic Therapy;Biofeedback;Canalith Repostioning;Balance training;Therapeutic exercise;Manual techniques;Vestibular;Therapeutic activities;Functional mobility training;Stair training;Gait training;Patient/family education;DME Instruction;Neuromuscular re-education    PT Next Visit Plan Monitor BP. Has pacemaker set at 40. Did we try timing walk with walking program? Continue gait w/o AD working on increasing step length, high level balance, functional strengthening/endurance. Continue VOR x 1, progress time. Habituation to Head Turns/Nods. Continue to work on turning activities with 1/4 and 180 degree turns.    Consulted and Agree with Plan of Care Patient;Family member/caregiver    Family Member Consulted pt's husband: [name]            Patient will benefit from skilled therapeutic intervention in order to improve the following deficits and impairments:  Abnormal gait,Decreased endurance,Impaired sensation,Cardiopulmonary status limiting activity,Decreased knowledge of use of DME,Decreased activity tolerance,Decreased strength,Decreased balance,Decreased mobility,Dizziness,Impaired flexibility  Visit Diagnosis: Other abnormalities of gait and mobility  Unsteadiness on feet     Problem List Patient Active Problem List   Diagnosis Date Noted  . A-fib ([place]) 07/06/2020  . Pacemaker - MDT 07/06/2020  . New onset seizure ([place]) 06/28/2020  . 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  . Hemiplegia and hemiparesis following cerebral infarction affecting left non-dominant side ([place]) 02/05/2020  . 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 CVA (cerebral vascular accident) ([place]) 01/09/2020  . Dizziness 11/30/2018  . Paroxysmal atrial fibrillation ([place]) 08/13/2018  . Prediabetes 06/15/2018  . Palpitations 06/05/2018  . Essential hypertension 06/05/2018  . Hyperlipidemia LDL goal <70 06/05/2018  . Acute ischemic right MCA stroke ([place]) 06/05/2018  . Stroke (cerebrum) ([place]) - R MCA s/p tPA, embolic, source unknown 50/35/4656  . SVT (supraventricular tachycardia) ([place]) 05/28/2015  . PVC's (premature ventricular contractions) 05/28/2015  . BENIGN POSITIONAL VERTIGO 06/16/2009  . ALLERGIC RHINITIS 06/16/2009    Electa Sniff, PT, DPT, NCS 09/24/2020, 12:57 PM  [facility] [address] [place], [place], [address] Phone: [phone]   Fax:  [phone]  Name: Maeby Vankleeck MRN: [id number] Date of Birth: [birth year]

## 2020-09-29 ENCOUNTER — Ambulatory Visit: Payer: Medicare Other

## 2020-09-29 ENCOUNTER — Other Ambulatory Visit: Payer: Self-pay

## 2020-09-29 DIAGNOSIS — R2689 Other abnormalities of gait and mobility: Secondary | ICD-10-CM | POA: Diagnosis not present

## 2020-09-29 DIAGNOSIS — M6281 Muscle weakness (generalized): Secondary | ICD-10-CM | POA: Diagnosis not present

## 2020-09-29 DIAGNOSIS — R42 Dizziness and giddiness: Secondary | ICD-10-CM | POA: Diagnosis not present

## 2020-09-29 DIAGNOSIS — R2681 Unsteadiness on feet: Secondary | ICD-10-CM

## 2020-09-29 NOTE — Therapy (Signed)
[facility] [address] [place], [place], [address] Phone: [phone]   Fax:  [phone]  Physical Therapy Treatment  Patient Details  Name: Mary Mercado MRN: [id number] Date of Birth: Apr 04, 1941 Referring Provider (PT): [name]   Encounter Date: 09/29/2020   PT End of Session - 09/29/20 1148    Visit Number 13    Number of Visits 17    Date for PT Re-Evaluation 11/16/20   60 day poc, 90 day cert   Authorization Type Medicare so 10th visit progress note    PT Start Time 1146    PT Stop Time 1228    PT Time Calculation (min) 42 min    Equipment Utilized During Treatment Gait belt    Activity Tolerance Patient tolerated treatment well    Behavior During Therapy [facility] for tasks assessed/performed           Past Medical History:  Diagnosis Date  . Allergy   . CVA (cerebral vascular accident) ([place]) 05/2018, 01/09/20  . Hyperlipidemia LDL goal <70   . Hypertension   . Palpitations   . Vertigo, benign positional     Past Surgical History:  Procedure Laterality Date  . AV NODE ABLATION N/A 03/30/2020   Procedure: AV NODE ABLATION;  Surgeon: Deboraha Sprang, MD;  Location: [place] [facility];  Service: Cardiovascular;  Laterality: N/A;  . AV NODE ABLATION N/A 03/30/2020   Procedure: AV NODE ABLATION;  Surgeon: Deboraha Sprang, MD;  Location: [place] [facility];  Service: Cardiovascular;  Laterality: N/A;  . CARDIOVERSION Left 10/31/2018   Procedure: CARDIOVERSION;  Surgeon: Thayer Headings, MD;  Location: [place];  Service: Cardiovascular;  Laterality: Left;  . CARDIOVERSION N/A 01/08/2020   Procedure: CARDIOVERSION;  Surgeon: [name], MD;  Location: [facility] ENDOSCOPY;  Service: Cardiovascular;  Laterality: N/A;  . Hemilaminectomy and microdiskectomy at L4-5 on the left.  12/10/2003  . PACEMAKER IMPLANT N/A 03/30/2020   Procedure: PACEMAKER IMPLANT;  Surgeon: Deboraha Sprang, MD;   Location: [place] [facility];  Service: Cardiovascular;  Laterality: N/A;  . TEE WITHOUT CARDIOVERSION N/A 06/05/2018   Procedure: TRANSESOPHAGEAL ECHOCARDIOGRAM (TEE);  Surgeon: Lelon Perla, MD;  Location: [facility] ENDOSCOPY;  Service: Cardiovascular;  Laterality: N/A;  loop    There were no vitals filed for this visit.   Subjective Assessment - 09/29/20 1148    Subjective Pt reports she is doing well. She went to family reunion this weekend and lasted for 3 hours and felt good. Has been walking 6-8 minutes the last couple evenings.    Patient is accompained by: Family member    Pertinent History CAD, hx of CVA, PAF s/p DCCV, BPPV, a-fib, HLD    Patient Stated Goals Pt would like to be able to live normally with moving and walking easier.    Currently in Pain? No/denies                             [facility] Adult PT Treatment/Exercise - 09/29/20 1149      Ambulation/Gait   Ambulation/Gait Yes    Ambulation/Gait Assistance 5: Supervision    Ambulation/Gait Assistance Details Pt was cued to try to increase step length and relax arms for more arm swing. Pt reported feeling winded towards the end and needed to rest. Pt ambulated about 6 min total.    Ambulation Distance (Feet) 600 Feet    Assistive device  None    Gait Pattern Step-through pattern;Decreased step length - right;Decreased step length - left    Ambulation Surface Level;Indoor      Neuro Re-ed    Neuro Re-ed Details  In // bars: standing on rockerboard positioned ant/post trying to maintain level x 30 sec then looking up/down x 10 then rocking board ant/post x 10 without UE support. Stepping up on rockerboard and over off other side then 180 degree turn to repeat stepping back over CGA/SBA. Performed x 8 bouts. Pt reports one 8/10 dizziness but the others more 5-6/10. Still hung around after resting after. Standing at counter in front of cabinets: moving 6 cones 1 at a time from counter to overhead cabinet across  body following with eyes and moving head throughout with each arm x 6 then performed with moving to lower cabinet with pt having to squat down to reach shelf. Pt reported feeling less steady with this. Close SBA throughout.                    PT Short Term Goals - 09/17/20 1205      PT SHORT TERM GOAL #1   Title Pt will decrease TUG from 14.67 sec to <12 sec for improved balance and functional mobility.    Baseline 08/18/20 14.67 sec; 15.06 seconds on 09/15/20 with no AD    Time 4    Period Weeks    Status Not Met    Target Date 09/18/20      PT SHORT TERM GOAL #2   Title Pt will increase FGA from 16 to >19/30 for improved balance and gait safety.    Baseline 08/18/20 16/30. 09/17/20 17/30    Time 4    Period Weeks    Status Not Met    Target Date 09/18/20      PT SHORT TERM GOAL #3   Title Pt will ambulate >500' without AD supervision on level surfaces.    Baseline met on 09/15/20    Time 4    Period Weeks    Status Achieved    Target Date 09/18/20             PT Long Term Goals - 08/19/20 0912      PT LONG TERM GOAL #1   Title Pt will be independent with progressive HEP for strength, balance and functional mobility to continue gains on own.    Time 8    Period Weeks    Status New    Target Date 10/18/20      PT LONG TERM GOAL #2   Title Pt will increase gait speed to >0.30ms without AD for improved gait safety.    Baseline 08/18/20 0.528m without AD    Time 8    Period Weeks    Status New    Target Date 10/18/20      PT LONG TERM GOAL #3   Title Pt will ambulate >800' on varied surfaces with LRAD mod I for improved community mobility.    Time 8    Period Weeks    Status New    Target Date 10/18/20      PT LONG TERM GOAL #4   Title Pt will increase FGA from 16 to >22/30 for improved balance and gait safety.    Baseline 08/18/20 16/30    Time 8    Period Weeks    Status New    Target Date 10/18/20  Plan - 09/29/20 1301     Clinical Impression Statement Pt was able to increase time walking during session today to about 6 min without AD. PT continued to focus on 180 degree turns as well as habituation activities. Pt reported only 1 higher dizzy rating of 8/10 with 180 degree turns and the rest more like 6/10. She was most challenged with looking down to reach in to lower cabinet with habituation activities.    Personal Factors and Comorbidities Age;Comorbidity 3+    Comorbidities CAD, hx of CVA, PAF s/p DCCV, BPPV, a-fib, HLD, seizure    Examination-Activity Limitations Bend;Bathing;Carry;Stand;Squat;Stairs;Toileting;Dressing;Transfers;Locomotion Level    Examination-Participation Restrictions Church;Yard Work;Driving;Community Activity;Laundry;Meal Prep;Cleaning    Stability/Clinical Decision Making Evolving/Moderate complexity    Rehab Potential Good    PT Frequency 2x / week    PT Duration 8 weeks    PT Treatment/Interventions ADLs/Self Care Home Management;Aquatic Therapy;Biofeedback;Canalith Repostioning;Balance training;Therapeutic exercise;Manual techniques;Vestibular;Therapeutic activities;Functional mobility training;Stair training;Gait training;Patient/family education;DME Instruction;Neuromuscular re-education    PT Next Visit Plan Monitor BP. Has pacemaker set at 51. Continue gait w/o AD working on increasing step length, high level balance, functional strengthening/endurance. Continue VOR x 1, progress time. Habituation to Head Turns/Nods. Continue to work on turning activities with 1/4 and 180 degree turns.    Consulted and Agree with Plan of Care Patient;Family member/caregiver    Family Member Consulted pt's husband: [name]           Patient will benefit from skilled therapeutic intervention in order to improve the following deficits and impairments:  Abnormal gait,Decreased endurance,Impaired sensation,Cardiopulmonary status limiting activity,Decreased knowledge of use of DME,Decreased activity  tolerance,Decreased strength,Decreased balance,Decreased mobility,Dizziness,Impaired flexibility  Visit Diagnosis: Other abnormalities of gait and mobility  Unsteadiness on feet     Problem List Patient Active Problem List   Diagnosis Date Noted  . A-fib ([place]) 07/06/2020  . Pacemaker - MDT 07/06/2020  . New onset seizure ([place]) 06/28/2020  . 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  . Hemiplegia and hemiparesis following cerebral infarction affecting left non-dominant side ([place]) 02/05/2020  . 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 CVA (cerebral vascular accident) ([place]) 01/09/2020  . Dizziness 11/30/2018  . Paroxysmal atrial fibrillation ([place]) 08/13/2018  . Prediabetes 06/15/2018  . Palpitations 06/05/2018  . Essential hypertension 06/05/2018  . Hyperlipidemia LDL goal <70 06/05/2018  . Acute ischemic right MCA stroke ([place]) 06/05/2018  . Stroke (cerebrum) ([place]) - R MCA s/p tPA, embolic, source unknown 89/37/3428  . SVT (supraventricular tachycardia) ([place]) 05/28/2015  . PVC's (premature ventricular contractions) 05/28/2015  . BENIGN POSITIONAL VERTIGO 06/16/2009  . ALLERGIC RHINITIS 06/16/2009    Electa Sniff, PT, DPT, NCS 09/29/2020, 1:03 PM  [facility] [address] [place], [place], [address] Phone: [phone]   Fax:  [phone]  Name: Malak Duchesneau MRN: [id number] Date of Birth: [birth year]

## 2020-10-01 ENCOUNTER — Ambulatory Visit: Payer: Medicare Other | Admitting: Physical Therapy

## 2020-10-01 ENCOUNTER — Ambulatory Visit (INDEPENDENT_AMBULATORY_CARE_PROVIDER_SITE_OTHER): Payer: Medicare Other

## 2020-10-01 ENCOUNTER — Encounter: Payer: Self-pay | Admitting: Physical Therapy

## 2020-10-01 ENCOUNTER — Other Ambulatory Visit: Payer: Self-pay

## 2020-10-01 DIAGNOSIS — R42 Dizziness and giddiness: Secondary | ICD-10-CM | POA: Diagnosis not present

## 2020-10-01 DIAGNOSIS — R2689 Other abnormalities of gait and mobility: Secondary | ICD-10-CM | POA: Diagnosis not present

## 2020-10-01 DIAGNOSIS — I4430 Unspecified atrioventricular block: Secondary | ICD-10-CM | POA: Diagnosis not present

## 2020-10-01 DIAGNOSIS — R2681 Unsteadiness on feet: Secondary | ICD-10-CM

## 2020-10-01 DIAGNOSIS — M6281 Muscle weakness (generalized): Secondary | ICD-10-CM

## 2020-10-01 LAB — CUP PACEART REMOTE DEVICE CHECK
Battery Remaining Longevity: 113 mo
Battery Voltage: 3.07 V
Brady Statistic AP VP Percent: 0 %
Brady Statistic AP VS Percent: 0 %
Brady Statistic AS VP Percent: 99.61 %
Brady Statistic AS VS Percent: 0.39 %
Brady Statistic RA Percent Paced: 0 %
Brady Statistic RV Percent Paced: 99.61 %
Date Time Interrogation Session: 20220609004558
Implantable Lead Implant Date: 20211206
Implantable Lead Implant Date: 20211206
Implantable Lead Location: 753858
Implantable Lead Location: 753860
Implantable Lead Model: 3830
Implantable Lead Model: 5076
Implantable Pulse Generator Implant Date: 20211206
Lead Channel Impedance Value: 304 Ohm
Lead Channel Impedance Value: 361 Ohm
Lead Channel Impedance Value: 361 Ohm
Lead Channel Impedance Value: 513 Ohm
Lead Channel Pacing Threshold Amplitude: 1.625 V
Lead Channel Pacing Threshold Pulse Width: 0.4 ms
Lead Channel Sensing Intrinsic Amplitude: 7.375 mV
Lead Channel Sensing Intrinsic Amplitude: 7.5 mV
Lead Channel Sensing Intrinsic Amplitude: 9.375 mV
Lead Channel Sensing Intrinsic Amplitude: 9.375 mV
Lead Channel Setting Pacing Amplitude: 3.25 V
Lead Channel Setting Pacing Pulse Width: 0.4 ms
Lead Channel Setting Sensing Sensitivity: 2 mV

## 2020-10-01 NOTE — Therapy (Signed)
[facility] [address] [place], [place], [address] Phone: [phone]   Fax:  [phone]  Physical Therapy Treatment  Patient Details  Name: Mary Mercado MRN: [id number] Date of Birth: [birth year] Referring Provider (PT): [name]   Encounter Date: 10/01/2020   PT End of Session - 10/01/20 1204     Visit Number 14    Number of Visits 17    Date for PT Re-Evaluation 11/16/20   60 day poc, 90 day cert   Authorization Type Medicare so 10th visit progress note    PT Start Time 1016    PT Stop Time 1101    PT Time Calculation (min) 45 min    Equipment Utilized During Treatment Gait belt    Activity Tolerance Patient tolerated treatment well    Behavior During Therapy [facility] for tasks assessed/performed             Past Medical History:  Diagnosis Date   Allergy    CVA (cerebral vascular accident) ([place]) 05/2018, 01/09/20   Hyperlipidemia LDL goal <70    Hypertension    Palpitations    Vertigo, benign positional     Past Surgical History:  Procedure Laterality Date   AV NODE ABLATION N/A 03/30/2020   Procedure: AV NODE ABLATION;  Surgeon: Deboraha Sprang, MD;  Location: [place] [facility];  Service: Cardiovascular;  Laterality: N/A;   AV NODE ABLATION N/A 03/30/2020   Procedure: AV NODE ABLATION;  Surgeon: Deboraha Sprang, MD;  Location: [place] [facility];  Service: Cardiovascular;  Laterality: N/A;   CARDIOVERSION Left 10/31/2018   Procedure: CARDIOVERSION;  Surgeon: Acie Fredrickson Wonda Cheng, MD;  Location: [facility];  Service: Cardiovascular;  Laterality: Left;   CARDIOVERSION N/A 01/08/2020   Procedure: CARDIOVERSION;  Surgeon: [name], MD;  Location: [facility] ENDOSCOPY;  Service: Cardiovascular;  Laterality: N/A;   Hemilaminectomy and microdiskectomy at L4-5 on the left.  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   Procedure: PACEMAKER IMPLANT;  Surgeon: Deboraha Sprang, MD;  Location: [place] [facility];  Service: Cardiovascular;  Laterality: N/A;   TEE WITHOUT CARDIOVERSION N/A 06/05/2018   Procedure: TRANSESOPHAGEAL ECHOCARDIOGRAM (TEE);  Surgeon: Lelon Perla, MD;  Location: [facility] ENDOSCOPY;  Service: Cardiovascular;  Laterality: N/A;  loop    There were no vitals filed for this visit.   Subjective Assessment - 10/01/20 1018     Subjective Reports turns are getting easier. Has been going on walks every night with her rollator. Feeling good today, slept well last night.    Patient is accompained by: Family member    Pertinent History CAD, hx of CVA, PAF s/p DCCV, BPPV, a-fib, HLD    Patient Stated Goals Pt would like to be able to live normally with moving and walking easier.    Currently in Pain? No/denies                               [facility] Adult PT Treatment/Exercise - 10/01/20 1028       Ambulation/Gait   Ambulation/Gait Yes    Ambulation/Gait Assistance 5: Supervision;4: Min guard    Ambulation/Gait Assistance Details cues to relax BUE for more reciprocal arm swing, performed scanning environment with head nods and turns   Ambulation Distance (Feet) 345 Feet    Assistive device None    Gait Pattern Step-through pattern;Decreased step length - right;Decreased step length - left  Ambulation Surface Level;Indoor                 Balance Exercises - 10/01/20 0001       Balance Exercises: Standing   Standing Eyes Closed Narrow base of support (BOS);Foam/compliant surface;3 reps;30 secs;Limitations    Standing Eyes Closed Limitations 3 x 30 seconds on blue foam, mild postural sway    Step Over Hurdles / Cones stepping over 5 obstacles (2 4" and 3 lower profile orange hurdles) down approx. 25-30' in hallway, cues to maintain gait speed and step length between obstacles and incr hip flexion when clearing. Pt reporting moderate 5-6/10 dizziness when performing some turns, that subsides with standing rest break.    Other Standing  Exercises on red/blue mats next to countertop: marching down and back x3 reps with no UE support, tandem gait down and back 3 reps with min guards and performing turns after each rep, pt with mild dizziness 3-4/10 with some turns.    Other Standing Exercises Comments on blue air ex: turning body and head to R/L to pass therapist ball and grabbing ball from therapist (laterally, superior laterally) x8 reps B, no dizziness when performing.                 PT Short Term Goals - 09/17/20 1205       PT SHORT TERM GOAL #1   Title Pt will decrease TUG from 14.67 sec to <12 sec for improved balance and functional mobility.    Baseline 08/18/20 14.67 sec; 15.06 seconds on 09/15/20 with no AD    Time 4    Period Weeks    Status Not Met    Target Date 09/18/20      PT SHORT TERM GOAL #2   Title Pt will increase FGA from 16 to >19/30 for improved balance and gait safety.    Baseline 08/18/20 16/30. 09/17/20 17/30    Time 4    Period Weeks    Status Not Met    Target Date 09/18/20      PT SHORT TERM GOAL #3   Title Pt will ambulate >500' without AD supervision on level surfaces.    Baseline met on 09/15/20    Time 4    Period Weeks    Status Achieved    Target Date 09/18/20               PT Long Term Goals - 08/19/20 0912       PT LONG TERM GOAL #1   Title Pt will be independent with progressive HEP for strength, balance and functional mobility to continue gains on own.    Time 8    Period Weeks    Status New    Target Date 10/18/20      PT LONG TERM GOAL #2   Title Pt will increase gait speed to >0.22ms without AD for improved gait safety.    Baseline 08/18/20 0.551m without AD    Time 8    Period Weeks    Status New    Target Date 10/18/20      PT LONG TERM GOAL #3   Title Pt will ambulate >800' on varied surfaces with LRAD mod I for improved community mobility.    Time 8    Period Weeks    Status New    Target Date 10/18/20      PT LONG TERM GOAL #4   Title Pt  will increase FGA from 16 to >22/30 for  improved balance and gait safety.    Baseline 08/18/20 16/30    Time 8    Period Weeks    Status New    Target Date 10/18/20                   Plan - 10/01/20 1255     Clinical Impression Statement Continued to work on dynamic gait activities with no AD today - working on head motions, stepping over obstacles. When performing turns during session, pt reporting dizziness as a max of 5-6/10, which subsides quickly with standing rest break. Will continue to progress towards LTGs.    Personal Factors and Comorbidities Age;Comorbidity 3+    Comorbidities CAD, hx of CVA, PAF s/p DCCV, BPPV, a-fib, HLD, seizure    Examination-Activity Limitations Bend;Bathing;Carry;Stand;Squat;Stairs;Toileting;Dressing;Transfers;Locomotion Level    Examination-Participation Restrictions Church;Yard Work;Driving;Community Activity;Laundry;Meal Prep;Cleaning    Stability/Clinical Decision Making Evolving/Moderate complexity    Rehab Potential Good    PT Frequency 2x / week    PT Duration 8 weeks    PT Treatment/Interventions ADLs/Self Care Home Management;Aquatic Therapy;Biofeedback;Canalith Repostioning;Balance training;Therapeutic exercise;Manual techniques;Vestibular;Therapeutic activities;Functional mobility training;Stair training;Gait training;Patient/family education;DME Instruction;Neuromuscular re-education    PT Next Visit Plan pt has only 2 more appts this week. Monitor BP. Has pacemaker set at 10. Continue gait w/o AD working on increasing step length, high level balance, functional strengthening/endurance. Continue VOR x 1, progress time. Habituation to Head Turns/Nods. Continue to work on turning activities with 1/4 and 180 degree turns.    Consulted and Agree with Plan of Care Patient;Family member/caregiver    Family Member Consulted pt's husband: [name]             Patient will benefit from skilled therapeutic intervention in order to improve the  following deficits and impairments:  Abnormal gait, Decreased endurance, Impaired sensation, Cardiopulmonary status limiting activity, Decreased knowledge of use of DME, Decreased activity tolerance, Decreased strength, Decreased balance, Decreased mobility, Dizziness, Impaired flexibility  Visit Diagnosis: Other abnormalities of gait and mobility  Unsteadiness on feet  Muscle weakness (generalized)  Dizziness and giddiness     Problem List Patient Active Problem List   Diagnosis Date Noted   A-fib ([place]) 07/06/2020   Pacemaker - MDT 07/06/2020   New onset seizure ([place]) 06/28/2020   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   Hemiplegia and hemiparesis following cerebral infarction affecting left non-dominant side ([place]) 02/05/2020   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[place]) 01/14/2020   CVA (cerebral vascular accident) ([place]) 01/09/2020   Dizziness 11/30/2018   Paroxysmal atrial fibrillation ([place]) 08/13/2018   Prediabetes 06/15/2018   Palpitations 06/05/2018   Essential hypertension 06/05/2018   Hyperlipidemia LDL goal <70 06/05/2018   Acute ischemic right MCA stroke ([place]) 06/05/2018   Stroke (cerebrum) ([place]) - R MCA s/p tPA, embolic, source unknown 05/69/7948   SVT (supraventricular tachycardia) ([place]) 05/28/2015   PVC's (premature ventricular contractions) 05/28/2015   BENIGN POSITIONAL VERTIGO 06/16/2009   ALLERGIC RHINITIS 06/16/2009    Arliss Journey, PT, DPT  10/01/2020, 1:13 PM  [facility] [address] [place], [place], [address] Phone: [phone]   Fax:  [phone]  Name: Mary Mercado MRN: [id number] Date of Birth: Dec 22, 1940

## 2020-10-06 ENCOUNTER — Other Ambulatory Visit: Payer: Self-pay

## 2020-10-06 ENCOUNTER — Encounter: Payer: Self-pay | Admitting: Physical Therapy

## 2020-10-06 ENCOUNTER — Ambulatory Visit: Payer: Medicare Other | Admitting: Physical Therapy

## 2020-10-06 DIAGNOSIS — M6281 Muscle weakness (generalized): Secondary | ICD-10-CM | POA: Diagnosis not present

## 2020-10-06 DIAGNOSIS — R2689 Other abnormalities of gait and mobility: Secondary | ICD-10-CM | POA: Diagnosis not present

## 2020-10-06 DIAGNOSIS — R2681 Unsteadiness on feet: Secondary | ICD-10-CM

## 2020-10-06 DIAGNOSIS — R42 Dizziness and giddiness: Secondary | ICD-10-CM | POA: Diagnosis not present

## 2020-10-06 NOTE — Therapy (Signed)
[facility] [address] [place], [place], [address] Phone: [phone]   Fax:  [phone]  Physical Therapy Treatment  Patient Details  Name: Mary Mercado MRN: [id number] Date of Birth: 08-22-1940 Referring Provider (PT): [name]   Encounter Date: 10/06/2020   PT End of Session - 10/06/20 1411     Visit Number 15    Number of Visits 17    Date for PT Re-Evaluation 11/16/20   60 day poc, 90 day cert   Authorization Type Medicare so 10th visit progress note    PT Start Time 1058    PT Stop Time 1143    PT Time Calculation (min) 45 min    Equipment Utilized During Treatment Gait belt    Activity Tolerance Patient tolerated treatment well    Behavior During Therapy [facility] for tasks assessed/performed             Past Medical History:  Diagnosis Date   Allergy    CVA (cerebral vascular accident) ([place]) 05/2018, 01/09/20   Hyperlipidemia LDL goal <70    Hypertension    Palpitations    Vertigo, benign positional     Past Surgical History:  Procedure Laterality Date   AV NODE ABLATION N/A 03/30/2020   Procedure: AV NODE ABLATION;  Surgeon: Deboraha Sprang, MD;  Location: [facility];  Service: Cardiovascular;  Laterality: N/A;   AV NODE ABLATION N/A 03/30/2020   Procedure: AV NODE ABLATION;  Surgeon: Deboraha Sprang, MD;  Location: [facility];  Service: Cardiovascular;  Laterality: N/A;   CARDIOVERSION Left 10/31/2018   Procedure: CARDIOVERSION;  Surgeon: Acie Fredrickson Wonda Cheng, MD;  Location: [facility];  Service: Cardiovascular;  Laterality: Left;   CARDIOVERSION N/A 01/08/2020   Procedure: CARDIOVERSION;  Surgeon: [name], MD;  Location: [facility];  Service: Cardiovascular;  Laterality: N/A;   Hemilaminectomy and microdiskectomy at L4-5 on the left.  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   Procedure: PACEMAKER IMPLANT;  Surgeon: Deboraha Sprang, MD;  Location: [facility];  Service: Cardiovascular;  Laterality: N/A;   TEE WITHOUT CARDIOVERSION N/A 06/05/2018   Procedure: TRANSESOPHAGEAL ECHOCARDIOGRAM (TEE);  Surgeon: Lelon Perla, MD;  Location: [facility] ENDOSCOPY;  Service: Cardiovascular;  Laterality: N/A;  loop    There were no vitals filed for this visit.   Subjective Assessment - 10/06/20 1059     Subjective No changes since she was last here. Still feeling unbalanced when walking with no AD. Attempted walking into a restaurant while just holding her cane and and needed to hold onto her husband. Would like to continue with therapy for another couple of weeks. Still feeling more unbalanced and slightly dizzy (esp with turns).    Patient is accompained by: Family member    Pertinent History CAD, hx of CVA, PAF s/p DCCV, BPPV, a-fib, HLD    Patient Stated Goals Pt would like to be able to live normally with moving and walking easier.    Currently in Pain? No/denies                [facility] PT Assessment - 10/06/20 1110       Timed Up and Go Test   Normal TUG (seconds) 14.9   with no AD     Functional Gait  Assessment   Gait assessed  Yes    Gait Level Surface Walks 20 ft, slow speed, abnormal gait pattern, evidence for imbalance or deviates  10-15 in outside of the 12 in walkway width. Requires more than 7 sec to ambulate 20 ft.   10 seconds   Change in Gait Speed Able to change speed, demonstrates mild gait deviations, deviates 6-10 in outside of the 12 in walkway width, or no gait deviations, unable to achieve a major change in velocity, or uses a change in velocity, or uses an assistive device.    Gait with Horizontal Head Turns Performs head turns smoothly with slight change in gait velocity (eg, minor disruption to smooth gait path), deviates 6-10 in outside 12 in walkway width, or uses an assistive device.    Gait with Vertical Head Turns Performs task with slight change in gait velocity (eg, minor disruption to smooth gait path),  deviates 6 - 10 in outside 12 in walkway width or uses assistive device   5/10 dizziness   Gait and Pivot Turn Pivot turns safely in greater than 3 sec and stops with no loss of balance, or pivot turns safely within 3 sec and stops with mild imbalance, requires small steps to catch balance.   8/10 dizziness   Step Over Obstacle Is able to step over 2 stacked shoe boxes taped together (9 in total height) without changing gait speed. No evidence of imbalance.    Gait with Narrow Base of Support Is able to ambulate for 10 steps heel to toe with no staggering.    Gait with Eyes Closed Cannot walk 20 ft without assistance, severe gait deviations or imbalance, deviates greater than 15 in outside 12 in walkway width or will not attempt task.    Ambulating Backwards Walks 20 ft, slow speed, abnormal gait pattern, evidence for imbalance, deviates 10-15 in outside 12 in walkway width.    Steps Alternating feet, must use rail.    Total Score 18    FGA comment: Scores <22/30 indicate increased fall risk                 Vestibular Assessment - 10/06/20 1135       Positional Sensitivities   Nose to Right Knee Lightheadedness    Right Knee to Sitting No dizziness    Nose to Left Knee No dizziness    Left Knee to Sitting Lightheadedness    Head Turning x 5 Severe dizziness   in sitting, pt closing her eyes   Head Nodding x 5 Severe dizziness   in sitting, pt closing her eyes                     [facility] Adult PT Treatment/Exercise - 10/06/20 1128       Ambulation/Gait   Ambulation/Gait Yes    Ambulation/Gait Assistance 5: Supervision;4: Min guard    Ambulation/Gait Assistance Details one instance of min guard due to pt rounding a turn and reporting feeling dizzy/unsteady    Ambulation Distance (Feet) 115 Feet   plus additional clinic distances   Assistive device None    Gait Pattern Step-through pattern;Decreased step length - right;Decreased step length - left    Ambulation Surface  Level;Indoor    Gait velocity 12.97 seconds = .77 m/s                    PT Education - 10/06/20 1411     Education Details results of goals, areas to continue to address with PT, scheduling for an additional 2x week for 4 weeks    Person(s) Educated Patient;Spouse    Methods Explanation  Comprehension Verbalized understanding              PT Short Term Goals - 09/17/20 1205       PT SHORT TERM GOAL #1   Title Pt will decrease TUG from 14.67 sec to <12 sec for improved balance and functional mobility.    Baseline 08/18/20 14.67 sec; 15.06 seconds on 09/15/20 with no AD    Time 4    Period Weeks    Status Not Met    Target Date 09/18/20      PT SHORT TERM GOAL #2   Title Pt will increase FGA from 16 to >19/30 for improved balance and gait safety.    Baseline 08/18/20 16/30. 09/17/20 17/30    Time 4    Period Weeks    Status Not Met    Target Date 09/18/20      PT SHORT TERM GOAL #3   Title Pt will ambulate >500' without AD supervision on level surfaces.    Baseline met on 09/15/20    Time 4    Period Weeks    Status Achieved    Target Date 09/18/20               PT Long Term Goals - 10/06/20 1127       PT LONG TERM GOAL #1   Title Pt will be independent with progressive HEP for strength, balance and functional mobility to continue gains on own.    Time 8    Period Weeks    Status New      PT LONG TERM GOAL #2   Title Pt will increase gait speed to >0.48ms without AD for improved gait safety.    Baseline 08/18/20 0.572m without AD; 12.97 seconds = .77 m/s    Time 8    Period Weeks    Status Achieved      PT LONG TERM GOAL #3   Title Pt will ambulate >800' on varied surfaces with LRAD mod I for improved community mobility.    Time 8    Period Weeks    Status New      PT LONG TERM GOAL #4   Title Pt will increase FGA from 16 to >22/30 for improved balance and gait safety.    Baseline 08/18/20 16/30; 18/30 on 10/06/20    Time 8    Period  Weeks    Status Not Met                   Plan - 10/06/20 1417     Clinical Impression Statement Today's skilled session focused on beginning to assess LTGs. Pt met LTG #2 - improved gait speed to .77 m/s (from .58 m/s) with no AD, indicating that pt is a limited community ambulator. Pt did not meet LTG #4 in regards to FGA, scored an 18/30 today (previously 17/30). Pt with most difficulty with retro gait, gait with head motions, turns, and eyes closed. Began to assess MSQ with pt with pt reporting the most difficulty with seated head turns and vertical head nods rating severe dizziness. Will finish assessing at next visit. Will plan to re-cert for an additional 2x week for 4 weeks to continue to address vestibular deficits, balance, gait, strength, and endurance in order to decr fall risk and improve functional mobility. Pt and pt's spouse in agreement with plan.    Personal Factors and Comorbidities Age;Comorbidity 3+    Comorbidities CAD, hx of CVA, PAF s/p DCCV, BPPV,  a-fib, HLD, seizure    Examination-Activity Limitations Bend;Bathing;Carry;Stand;Squat;Stairs;Toileting;Dressing;Transfers;Locomotion Level    Examination-Participation Restrictions Church;Yard Work;Driving;Community Activity;Laundry;Meal Prep;Cleaning    Stability/Clinical Decision Making Evolving/Moderate complexity    Rehab Potential Good    PT Frequency 2x / week    PT Duration 8 weeks    PT Treatment/Interventions ADLs/Self Care Home Management;Aquatic Therapy;Biofeedback;Canalith Repostioning;Balance training;Therapeutic exercise;Manual techniques;Vestibular;Therapeutic activities;Functional mobility training;Stair training;Gait training;Patient/family education;DME Instruction;Neuromuscular re-education    PT Next Visit Plan check remainder of goals and MSQ and perform re-cert. Monitor BP. Has pacemaker set at 10. Continue gait w/o AD working on increasing step length, high level balance, functional  strengthening/endurance. Continue VOR x 1, progress time. Habituation to Head Turns/Nods. Continue to work on turning activities with 1/4 and 180 degree turns.    Consulted and Agree with Plan of Care Patient;Family member/caregiver    Family Member Consulted pt's husband: [name]             Patient will benefit from skilled therapeutic intervention in order to improve the following deficits and impairments:  Abnormal gait, Decreased endurance, Impaired sensation, Cardiopulmonary status limiting activity, Decreased knowledge of use of DME, Decreased activity tolerance, Decreased strength, Decreased balance, Decreased mobility, Dizziness, Impaired flexibility  Visit Diagnosis: Other abnormalities of gait and mobility  Unsteadiness on feet  Muscle weakness (generalized)  Dizziness and giddiness     Problem List Patient Active Problem List   Diagnosis Date Noted   A-fib ([place]) 07/06/2020   Pacemaker - MDT 07/06/2020   New onset seizure ([place]) 06/28/2020   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   Hemiplegia and hemiparesis following cerebral infarction affecting left non-dominant side ([place]) 02/05/2020   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   CVA (cerebral vascular accident) ([place]) 01/09/2020   Dizziness 11/30/2018   Paroxysmal atrial fibrillation ([place]) 08/13/2018   Prediabetes 06/15/2018   Palpitations 06/05/2018   Essential hypertension 06/05/2018   Hyperlipidemia LDL goal <70 06/05/2018   Acute ischemic right MCA stroke ([place]) 06/05/2018   Stroke (cerebrum) ([place]) - R MCA s/p tPA, embolic, source unknown 49/67/5916   SVT (supraventricular tachycardia) ([place]) 05/28/2015   PVC's (premature ventricular contractions) 05/28/2015   BENIGN POSITIONAL VERTIGO 06/16/2009   ALLERGIC RHINITIS 06/16/2009    Arliss Journey, PT, DPT  10/06/2020, 2:19 PM  [facility] [address] [place], [place], [address] Phone: [phone]   Fax:  [phone]  Name: Ayline Dingus MRN: [id number] Date of Birth: 1940/12/28

## 2020-10-08 ENCOUNTER — Ambulatory Visit: Payer: Medicare Other | Admitting: Physical Therapy

## 2020-10-08 ENCOUNTER — Encounter: Payer: Self-pay | Admitting: Neurology

## 2020-10-08 ENCOUNTER — Ambulatory Visit (INDEPENDENT_AMBULATORY_CARE_PROVIDER_SITE_OTHER): Payer: Medicare Other | Admitting: Neurology

## 2020-10-08 ENCOUNTER — Other Ambulatory Visit: Payer: Self-pay

## 2020-10-08 ENCOUNTER — Encounter: Payer: Self-pay | Admitting: Physical Therapy

## 2020-10-08 VITALS — BP 134/86 | HR 80 | Ht 65.0 in | Wt 124.3 lb

## 2020-10-08 DIAGNOSIS — Z9989 Dependence on other enabling machines and devices: Secondary | ICD-10-CM

## 2020-10-08 DIAGNOSIS — R2689 Other abnormalities of gait and mobility: Secondary | ICD-10-CM

## 2020-10-08 DIAGNOSIS — G4733 Obstructive sleep apnea (adult) (pediatric): Secondary | ICD-10-CM

## 2020-10-08 DIAGNOSIS — G4730 Sleep apnea, unspecified: Secondary | ICD-10-CM

## 2020-10-08 DIAGNOSIS — R42 Dizziness and giddiness: Secondary | ICD-10-CM | POA: Diagnosis not present

## 2020-10-08 DIAGNOSIS — G4731 Primary central sleep apnea: Secondary | ICD-10-CM

## 2020-10-08 DIAGNOSIS — M6281 Muscle weakness (generalized): Secondary | ICD-10-CM

## 2020-10-08 DIAGNOSIS — R2681 Unsteadiness on feet: Secondary | ICD-10-CM

## 2020-10-08 DIAGNOSIS — I639 Cerebral infarction, unspecified: Secondary | ICD-10-CM

## 2020-10-08 NOTE — Progress Notes (Signed)
Subjective:    Patient ID: Mary Mercado is a 80 y.o. female.  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  The patient is accompanied by her husband again today.  I first met her at the request of Dr. Leonie Man on 02/27/2020, at which time she reported snoring and daytime somnolence.  She was advised to proceed with sleep testing.  She had a baseline sleep study on 03/12/2020 which indicated severe obstructive sleep apnea with a total AHI of 44.6/h, O2 nadir 86%.  She was advised to proceed with a titration study.  She had this on 06/22/2020.  She had a trial of CPAP but had significant central apneas even on lower pressures.  She was switched to standard BiPAP therapy at 10/6 centimeters with ongoing central apneas, addition of a backup rate was insufficient to improve her central sleep disordered breathing and she had evidence of Cheyne-Stokes type breathing briefly as well.  She was eventually switched to ASV with a final EPAP of 10 cm, minimum pressure support of 3 cm and maximum pressure support of 12 cm, on ASV of 9 cm she did achieve an AHI of 1/h, O2 nadir 94% with nonsupine REM sleep achieved.  She was advised to start home ASV with an EPAP of 9 cm.  Set up date was 07/21/2020.  Today, 10/08/2020: I reviewed her ASV compliance data from 09/07/2020 through 10/06/2020, which is a total of 30 days, during which time she used her machine every night with percent use days  greater than 4 hours at 100%, indicating superb compliance with an average usage of 7 hours and 39 minutes, residual AHI borderline at 6/h, leak acceptable with a 95th percentile at 12.6 L/min on an EPAP of 9 cm, minimum pressure support of 3 cm, maximum pressure support of 12 cm.  She reports doing fairly well, feels stable, continues to take Keppra.  She has been adjusting to her ASV.  She is struggling a little bit with pressure tolerance and also mask discomfort.  When she tried the DreamWear full facemask, she did have noticeable increase in her leak, this was visible on her download around the end of May and first few days in June.  She is typically using a small AirTouch full facemask.  She still has a sore spot from the fullface mask on the nasal bridge.  They have been padding this with cotton.  She is motivated to continue with treatment and has benefited from treatment but is still adjusting to it.  She was hospitalized recently in March 2022 for new onset seizures.  She had left-sided twitching.  She was started on Keppra.  She had a recent follow-up appointment last month with Dr. Leonie Man.   The patient's allergies, current medications, family history, past medical history, past social history, past surgical history and problem list were reviewed and updated as appropriate.  Previously:   02/27/20: (She) reports snoring and some daytime somnolence.  She has been taking a nap on a nearly daily basis since her stroke in September.  She feels fatigued but also some degree of shortness of breath because of her A. fib.  Sometimes she just feels jittery.  She has been in outpatient therapy and is doing well.  She uses a rolling walker consistently in the house and outside the house since her stroke.  She has not fallen.  Her snoring is mild per husband.  She denies any recurrent morning headaches but does have nocturia about twice per average night.  Bedtime is around 10 PM and rise time around 7 or 8 AM.   She is not aware of any family history of sleep apnea, denies restless leg symptoms or leg twitching at night.  She often naps after lunch for up to 2 hours.  I reviewed your office note from 02/03/2020.  Her Epworth sleepiness score is 7/24, fatigue severity score is 38 out of 63.    Her Past Medical History Is Significant For: Past Medical History:  Diagnosis Date   Allergy    CVA (cerebral vascular accident) ([place]) 05/2018, 01/09/20   Hyperlipidemia LDL goal <70    Hypertension    Palpitations    Vertigo, benign positional     Her Past Surgical History Is Significant For: Past Surgical History:  Procedure Laterality Date   AV NODE ABLATION N/A 03/30/2020   Procedure: AV NODE ABLATION;  Surgeon: Deboraha Sprang, MD;  Location: [facility];  Service: Cardiovascular;  Laterality: N/A;   AV NODE ABLATION N/A 03/30/2020   Procedure: AV NODE ABLATION;  Surgeon: Deboraha Sprang, MD;  Location: [facility];  Service: Cardiovascular;  Laterality: N/A;   CARDIOVERSION Left 10/31/2018   Procedure: CARDIOVERSION;  Surgeon: Acie Fredrickson Wonda Cheng, MD;  Location: [facility];  Service: Cardiovascular;  Laterality: Left;   CARDIOVERSION N/A 01/08/2020   Procedure: CARDIOVERSION;  Surgeon: [name], MD;  Location: [facility];  Service: Cardiovascular;  Laterality: N/A;   Hemilaminectomy and microdiskectomy at L4-5 on the left.  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   Procedure: PACEMAKER IMPLANT;  Surgeon: Deboraha Sprang, MD;  Location: [facility];  Service: Cardiovascular;  Laterality: N/A;   TEE WITHOUT CARDIOVERSION N/A 06/05/2018   Procedure: TRANSESOPHAGEAL ECHOCARDIOGRAM (TEE);  Surgeon: Lelon Perla, MD;  Location[facility] ENDOSCOPY;  Service: Cardiovascular;  Laterality: N/A;  loop    Her Family History Is Significant For: Family History  Problem Relation Age of Onset   Colon cancer Father    Diabetes Mother    Heart attack Mother        8   Heart  attack Brother 71    Her Social History Is Significant For: Social History   Socioeconomic History   Marital status: Married    Spouse name: [name]   Number of children: Not on file   Years of education: 12+   Highest education level: Not on file  Occupational History    Comment: retired  Tobacco Use   Smoking status: Never   Smokeless tobacco: Never  Vaping Use   Vaping Use: Never used  Substance and Sexual Activity   Alcohol use: Never   Drug use: Never   Sexual activity: Not on file  Other Topics Concern   Not on file  Social History Narrative   Lives with husband   caffeine none daily   Right Handed   Social Determinants of Health   Financial Resource Strain: Not on file  Food Insecurity: Not on file  Transportation Needs: Not on file  Physical Activity: Not on file  Stress: Not on file  Social Connections: Not on file    Her Allergies Are:  Allergies  Allergen Reactions   Duloxetine     Causes Bp elevation.  Increased body jerks, nausea   Acetaminophen Hives and Rash  :   Her Current Medications Are:  Outpatient Encounter Medications as of 10/08/2020  Medication Sig   atorvastatin (LIPITOR) 80 MG tablet TAKE 1 TABLET (80 MG TOTAL) BY MOUTH DAILY AT 6 PM.   levETIRAcetam (KEPPRA) 500 MG tablet Take 1 tablet (500 mg total) by mouth 2 (two) times daily.   losartan (COZAAR) 50 MG tablet Take 1 tablet (50 mg total) by mouth daily.   metoprolol tartrate (LOPRESSOR) 25 MG tablet TAKE 0.5 TABLETS BY MOUTH AT BEDTIME.   PRADAXA 150 MG CAPS capsule TAKE 1 CAPSULE (150 MG TOTAL) BY MOUTH EVERY 12 (TWELVE) HOURS.   Facility-Administered Encounter Medications as of 10/08/2020  Medication   regadenoson (LEXISCAN) injection SOLN 0.4 mg   technetium tetrofosmin (TC-MYOVIEW) injection 32 millicurie  :  Review of Systems:  Out of a complete 14 point review of systems, all are reviewed and negative with the exception of these symptoms as listed below:  Review of  Systems  Neurological:        Here for follow up on  ASV machine. Reports she has noticed some leaks around her mask. She also reports some skin irritation on the bridge of her nose from the ffm.    Objective:  Neurological Exam  Physical Exam Physical Examination:   Vitals:   10/08/20 0934  BP: 134/86  Pulse: 80  SpO2: 99%    General Examination: The patient is a very pleasant 80 y.o. female in no acute distress. She appears well-developed and well-nourished and well groomed.   HEENT: Normocephalic, atraumatic, pupils are equal, round and reactive to light, status post left cataract extraction, dense right cataract noted.  Extraocular tracking is preserved, face is symmetric with normal facial animation.  She does have mild intermittent dysarthria and word finding difficulty.  Airway examination reveals mild mouth dryness, small airway entry, redundant soft palate, tonsils on the smaller side, Mallampati class I, neck circumference of 12 three-quarter inches.  She has a moderate overbite.  Tongue protrudes centrally and palate elevates symmetrically.  No carotid bruits.   Chest: Clear to auscultation without wheezing, rhonchi or crackles noted.   Heart: S1+S2+0, heart rate in the 90s, irregularly irregular.  No murmurs noted.     Abdomen: Soft, non-tender and non-distended.   Extremities: There is trace edema in the distal lower extremities bilaterally.   Skin: Warm and dry without trophic changes noted.   Musculoskeletal: exam reveals no obvious joint deformities.     Neurologically: Mental status: The patient is awake, alert and oriented in all 4 spheres. Her immediate and remote memory, attention, language skills and fund of knowledge are appropriate. There is no evidence of aphasia, agnosia, apraxia or anomia. Speech is clear with normal prosody and enunciation. Thought process is linear. Mood is normal and affect is normal. Cranial nerves II - XII are as described above under  HEENT exam. Motor exam: Normal bulk, strength and tone is noted. There is no tremor, Romberg is not tested for safety concerns. Fine motor skills and coordination: Mildly impaired in the upper extremities. Cerebellar testing: No dysmetria or intention tremor. There is no truncal or gait ataxia. Sensory exam: intact to light touch in the upper and lower extremities. Gait,  station and balance: She stands without major difficulty, she stands wide-based.  She has a rolling walker which she maneuvers well.   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  Her baseline sleep study from 03/12/2020 showed an AHI of 44.6/h, REM AHI of 52.8/h, O2 nadir 86%.  She had a subsequent titration study on 06/22/2020.  This was a difficult titration, she had significant central apneas which were difficult to control, did not respond to standard CPAP or BiPAP therapy and also not to BiPAP ST.  Eventually she had improvement in her complex sleep disordered breathing with ASV.  She started treatment with ASV at the end of March.  She is fully compliant with it.  AHI is fairly good, 6/h and average for the past month.  She is 100% compliant and is motivated to continue with treatment.  She has had some mask tolerance issues and also some pressure tolerance issues.  We mutually agreed to have her try 1 more different mask, we were able to provide her with a small Simplus full facemask sample.  In addition, I will decrease her EPAP from 9 cm  to 8 cm at this time on her ASV machine.  She is very commended on her treatment adherence and advised to follow-up routinely in this clinic to see Janett Billow is scheduled for November 2022.  She is advised to let us know if this works out for her as in the change in pressure and the different mask.  We can review a download remotely in about a month after the pressure change.  The patient and her husband were in agreement with the plan and I answered all the questions during today's visit.  This was an extended visit.   I spent 40 minutes in total face-to-face time and in reviewing records during pre-charting, more than 50% of which was spent in counseling and coordination of care, reviewing test results, reviewing medications and treatment regimen and/or in discussing or reviewing the diagnosis of OSA, CSA, the prognosis and treatment options. Pertinent laboratory and imaging test results that were available during this visit with the patient were reviewed by me and considered in my medical decision making (see chart for details).

## 2020-10-08 NOTE — Patient Instructions (Signed)
It was nice to see you both again today!  You are doing a superb job using your ASV machine, keep up the good work.  For better tolerance, as discussed, I will reduce your treatment pressure from 9 cm to 8 cm at this time.  I would like to suggest that we review your compliance download in a month, we can do this remotely.  Please call us in about 4 to 5 weeks so we can review another report and provide feedback.  I think the AirTouch foam mask is probably the best right now for you, I would suggest that you try the [employer] Simplus full facemask that I provided.  If you like it, I can prescribe it and [facility] should be able to take care of the next refill of the mask.  Please follow-up routinely to see Janett Billow as scheduled in November.

## 2020-10-08 NOTE — Addendum Note (Signed)
Addended by: Arliss Journey on: 10/08/2020 12:14 PM   Modules accepted: Orders

## 2020-10-08 NOTE — Therapy (Addendum)
[facility] [address] [place], [place], [address] Phone: [phone]   Fax:  [phone]  Physical Therapy Treatment/Re-Cert  Patient Details  Name: Mary Mercado MRN: [id number] Date of Birth: [birth year] Referring Provider (PT): [name]   Encounter Date: 10/08/2020   PT End of Session - 10/08/20 0925     Visit Number 16    Number of Visits 24    Date for PT Re-Evaluation 06/06/15   per re-cert on 3/56/70   Authorization Type Medicare so 10th visit progress note    PT Start Time 0845    PT Stop Time 0924   pt needing to leave early for an MD appt   PT Time Calculation (min) 39 min    Equipment Utilized During Treatment Gait belt    Activity Tolerance Patient tolerated treatment well    Behavior During Therapy [facility] for tasks assessed/performed             Past Medical History:  Diagnosis Date   Allergy    CVA (cerebral vascular accident) ([place]) 05/2018, 01/09/20   Hyperlipidemia LDL goal <70    Hypertension    Palpitations    Vertigo, benign positional     Past Surgical History:  Procedure Laterality Date   AV NODE ABLATION N/A 03/30/2020   Procedure: AV NODE ABLATION;  Surgeon: Deboraha Sprang, MD;  Location: [place] [facility];  Service: Cardiovascular;  Laterality: N/A;   AV NODE ABLATION N/A 03/30/2020   Procedure: AV NODE ABLATION;  Surgeon: Deboraha Sprang, MD;  Location: [place] [facility];  Service: Cardiovascular;  Laterality: N/A;   CARDIOVERSION Left 10/31/2018   Procedure: CARDIOVERSION;  Surgeon: Acie Fredrickson Wonda Cheng, MD;  Location: [facility];  Service: Cardiovascular;  Laterality: Left;   CARDIOVERSION N/A 01/08/2020   Procedure: CARDIOVERSION;  Surgeon: [name], MD;  Location: [facility] ENDOSCOPY;  Service: Cardiovascular;  Laterality: N/A;   Hemilaminectomy and microdiskectomy at L4-5 on the left.  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   Procedure: PACEMAKER  IMPLANT;  Surgeon: Deboraha Sprang, MD;  Location: [place] [facility];  Service: Cardiovascular;  Laterality: N/A;   TEE WITHOUT CARDIOVERSION N/A 06/05/2018   Procedure: TRANSESOPHAGEAL ECHOCARDIOGRAM (TEE);  Surgeon: Lelon Perla, MD;  Location: [facility] ENDOSCOPY;  Service: Cardiovascular;  Laterality: N/A;  loop    There were no vitals filed for this visit.   Subjective Assessment - 10/08/20 0847     Subjective No changes since she was here, sees the neurologist next.    Patient is accompained by: Family member    Pertinent History CAD, hx of CVA, PAF s/p DCCV, BPPV, a-fib, HLD    Patient Stated Goals Pt would like to be able to live normally with moving and walking easier.    Currently in Pain? No/denies                [facility] PT Assessment - 10/08/20 0001       Assessment   Medical Diagnosis gait disturbance, post stroke    Referring Provider (PT) [name]    Onset Date/Surgical Date 07/23/20      Prior Function   Level of Independence Independent                 Vestibular Assessment - 10/08/20 0847       Positional Sensitivities   Sit to Supine No dizziness    Supine to Left Side No dizziness  Supine to Right Side No dizziness    Supine to Sitting Lightheadedness   .5-1   Right Hallpike No dizziness   sidelying position, .5   Up from Right Hallpike Lightheadedness   up from sidelying   Up from Left Hallpike Lightheadedness   from sidelying position   Nose to Right Knee Lightheadedness    Right Knee to Sitting No dizziness    Nose to Left Knee No dizziness    Left Knee to Sitting Lightheadedness    Head Turning x 5 Severe dizziness   in sitting, pt closing her eyes   Head Nodding x 5 Severe dizziness   in sitting, pt closing her eyes   Pivot Right in Standing Lightheadedness    Pivot Left in Standing Lightheadedness    Rolling Right Lightheadedness   .5   Rolling Left Lightheadedness   .5                      Vestibular  Treatment/Exercise - 10/08/20 0902       Vestibular Treatment/Exercise   Vestibular Treatment Provided Gaze      X1 Viewing Horizontal   Foot Position seated    Reps 3    Comments x30 seconds - cues to slightly incr her speed, mild sx, educated to incr speed at home      X1 Viewing Vertical   Foot Position seated    Reps 2    Comments cues to slightly incr the speed, mild sx after stopping              Access Code: [email or web address] URL: [email or web address] Date: 10/08/2020 Prepared by: Janann August  Reviewed and updated exercises as appropriate. See MedBridge for further details.   Exercises Standing head turns - 2 x daily - 5 x weekly - 2 sets - 10 reps Romberg Stance with Head Nods - 2 x daily - 5 x weekly - 2 sets - 5 reps Standing Marching - 2 x daily - 5 x weekly - 2 sets - 10 reps Standing Quarter Turn - 2 x daily - 5 x weekly - 1 sets - 4-5 reps  New addition on 10/08/20:  Standing Balance with Eyes Closed on Foam - 2 x daily - 5 x weekly - 3 sets - 30 reps      PT Education - 10/08/20 0925     Education Details areas to focus on with MSQ, reviewed and updated HEP as appropriate.    Person(s) Educated Spouse;Patient    Methods Explanation;Demonstration;Handout    Comprehension Verbalized understanding;Returned demonstration              PT Short Term Goals - 09/17/20 1205       PT SHORT TERM GOAL #1   Title Pt will decrease TUG from 14.67 sec to <12 sec for improved balance and functional mobility.    Baseline 08/18/20 14.67 sec; 15.06 seconds on 09/15/20 with no AD    Time 4    Period Weeks    Status Not Met    Target Date 09/18/20      PT SHORT TERM GOAL #2   Title Pt will increase FGA from 16 to >19/30 for improved balance and gait safety.    Baseline 08/18/20 16/30. 09/17/20 17/30    Time 4    Period Weeks    Status Not Met    Target Date 09/18/20      PT SHORT TERM GOAL #3   Title Pt will  ambulate >500' without AD  supervision on level surfaces.    Baseline met on 09/15/20    Time 4    Period Weeks    Status Achieved    Target Date 09/18/20               PT Long Term Goals - 10/08/20 1206       PT LONG TERM GOAL #1   Title Pt will be independent with progressive HEP for strength, balance and functional mobility to continue gains on own.    Baseline reviewed on 10/08/20, pt will benefit from ongoing additions/revisions.    Time 8    Period Weeks    Status On-going      PT LONG TERM GOAL #2   Title Pt will increase gait speed to >0.53ms without AD for improved gait safety.    Baseline 08/18/20 0.585m without AD; 12.97 seconds = .77 m/s    Time 8    Period Weeks    Status Achieved      PT LONG TERM GOAL #3   Title Pt will ambulate >800' on varied surfaces with LRAD mod I for improved community mobility.    Time 8    Period Weeks    Status On-going      PT LONG TERM GOAL #4   Title Pt will increase FGA from 16 to >22/30 for improved balance and gait safety.    Baseline 08/18/20 16/30; 18/30 on 10/06/20    Time 8    Period Weeks    Status Not Met            Revised/ongoing LTGs for re-cert:     PT Long Term Goals - 10/08/20 1211       PT LONG TERM GOAL #1   Title Pt will be independent with progressive HEP for strength, balance and functional mobility to continue gains on own. ALL LTGS DUE 11/05/20    Baseline reviewed on 10/08/20, pt will benefit from ongoing additions/revisions.    Time 4    Period Weeks    Status On-going    Target Date 11/05/20      PT LONG TERM GOAL #2   Title Pt will increase gait speed to >0.90 m/s without AD for improved gait safety.    Baseline 12.97 seconds = .77 m/s    Time 4    Period Weeks    Status Revised      PT LONG TERM GOAL #3   Title Pt will ambulate >800' on varied surfaces with LRAD mod I for improved community mobility.    Time 4    Period Weeks    Status On-going      PT LONG TERM GOAL #4   Title Pt will increase FGA from  18 to >21/30 for improved balance and gait safety.    Baseline 18/30 on 10/06/20    Time 4    Period Weeks    Status Revised      PT LONG TERM GOAL #5   Title Pt will perform all items on the MSQ and rate as a 0/5 in order to demo decr motional sensitivity during gait and transfers.    Time 4    Period Weeks    Status New                 Plan - 10/08/20 1209     Clinical Impression Statement Finished assessing the MSQ today with pt reporting the most dizziness with head turns  and head nods (severe), remainder pt either reporting slight 1/5 dizziness or no dizziness. Reviewed pt's HEP and provided cues for proper technique and upgraded to perform with feet apart and eyes closed at home. With FGA assessed at last session with score of 18/30, indicates that pt is still at an incr risk of falls with no AD. Will re-cert for an additional 2x week for 4 weeks to continue to address dizziness, imbalance, strength, gait abnormalities, and endurance in order to improve functional mobility and decr pt's fall risk. LTGs updated/revised as appropriate.    Personal Factors and Comorbidities Age;Comorbidity 3+    Comorbidities CAD, hx of CVA, PAF s/p DCCV, BPPV, a-fib, HLD, seizure    Examination-Activity Limitations Bend;Bathing;Carry;Stand;Squat;Stairs;Toileting;Dressing;Transfers;Locomotion Level    Examination-Participation Restrictions Church;Yard Work;Driving;Community Activity;Laundry;Meal Prep;Cleaning    Stability/Clinical Decision Making Evolving/Moderate complexity    Rehab Potential Good    PT Frequency 2x / week    PT Duration 4 weeks    PT Treatment/Interventions ADLs/Self Care Home Management;Aquatic Therapy;Biofeedback;Canalith Repostioning;Balance training;Therapeutic exercise;Manual techniques;Vestibular;Therapeutic activities;Functional mobility training;Stair training;Gait training;Patient/family education;DME Instruction;Neuromuscular re-education    PT Next Visit Plan Has  pacemaker set at 26. Continue gait w/o AD working on increasing step length. balance with vision removed, retro gait and head turns. Continue VOR x 1, progress time. Habituation to Head Turns/Nods. continue to work on turns with no AD.    PT Home Exercise Plan [email or web address], plus seated VOR x1.    Consulted and Agree with Plan of Care Patient;Family member/caregiver    Family Member Consulted pt's husband: [name]             Patient will benefit from skilled therapeutic intervention in order to improve the following deficits and impairments:  Abnormal gait, Decreased endurance, Impaired sensation, Cardiopulmonary status limiting activity, Decreased knowledge of use of DME, Decreased activity tolerance, Decreased strength, Decreased balance, Decreased mobility, Dizziness, Impaired flexibility  Visit Diagnosis: Other abnormalities of gait and mobility  Muscle weakness (generalized)  Dizziness and giddiness  Unsteadiness on feet     Problem List Patient Active Problem List   Diagnosis Date Noted   A-fib ([place]) 07/06/2020   Pacemaker - MDT 07/06/2020   New onset seizure ([place]) 06/28/2020   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   Hemiplegia and hemiparesis following cerebral infarction affecting left non-dominant side ([place]) 02/05/2020   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   CVA (cerebral vascular accident) ([place]) 01/09/2020   Dizziness 11/30/2018   Paroxysmal atrial fibrillation ([place]) 08/13/2018   Prediabetes 06/15/2018   Palpitations 06/05/2018   Essential hypertension 06/05/2018   Hyperlipidemia LDL goal <70 06/05/2018   Acute ischemic right MCA stroke ([place]) 06/05/2018   Stroke (cerebrum) ([place]) - R MCA s/p tPA, embolic, source unknown 08/81/1031   SVT (supraventricular tachycardia) ([place]) 05/28/2015   PVC's (premature ventricular contractions) 05/28/2015   BENIGN POSITIONAL VERTIGO 06/16/2009   ALLERGIC RHINITIS 06/16/2009    Arliss Journey, PT, DPT  10/08/2020, 12:10 PM  [facility] [address] [place], [place], [address] Phone: [phone]   Fax:  [phone]  Name: Sadye Kiernan MRN: [id number] Date of Birth: 11/18/1940

## 2020-10-09 ENCOUNTER — Ambulatory Visit: Payer: Medicare Other

## 2020-10-12 ENCOUNTER — Other Ambulatory Visit: Payer: Self-pay

## 2020-10-12 ENCOUNTER — Encounter: Payer: Self-pay | Admitting: Physical Therapy

## 2020-10-12 ENCOUNTER — Ambulatory Visit: Payer: Medicare Other | Admitting: Physical Therapy

## 2020-10-12 DIAGNOSIS — R42 Dizziness and giddiness: Secondary | ICD-10-CM | POA: Diagnosis not present

## 2020-10-12 DIAGNOSIS — M6281 Muscle weakness (generalized): Secondary | ICD-10-CM | POA: Diagnosis not present

## 2020-10-12 DIAGNOSIS — R2681 Unsteadiness on feet: Secondary | ICD-10-CM | POA: Diagnosis not present

## 2020-10-12 DIAGNOSIS — R2689 Other abnormalities of gait and mobility: Secondary | ICD-10-CM

## 2020-10-12 NOTE — Therapy (Signed)
[facility] [address] [place], [place], [address] Phone: [phone]   Fax:  [phone]  Physical Therapy Treatment  Patient Details  Name: Mary Mercado MRN: [id number] Date of Birth: [birth year] Referring Provider (PT): [name]   Encounter Date: 10/12/2020   PT End of Session - 10/12/20 1624     Visit Number 17    Number of Visits 24    Date for PT Re-Evaluation 53/97/67   per re-cert on 3/41/93   Authorization Type Medicare so 10th visit progress note    PT Start Time 7902    PT Stop Time 1613    PT Time Calculation (min) 42 min    Equipment Utilized During Treatment Gait belt    Activity Tolerance Patient tolerated treatment well    Behavior During Therapy [facility] for tasks assessed/performed             Past Medical History:  Diagnosis Date   Allergy    CVA (cerebral vascular accident) ([place]) 05/2018, 01/09/20   Hyperlipidemia LDL goal <70    Hypertension    Palpitations    Vertigo, benign positional     Past Surgical History:  Procedure Laterality Date   AV NODE ABLATION N/A 03/30/2020   Procedure: AV NODE ABLATION;  Surgeon: Deboraha Sprang, MD;  Location: [place] [facility];  Service: Cardiovascular;  Laterality: N/A;   AV NODE ABLATION N/A 03/30/2020   Procedure: AV NODE ABLATION;  Surgeon: Deboraha Sprang, MD;  Location: [place] [facility];  Service: Cardiovascular;  Laterality: N/A;   CARDIOVERSION Left 10/31/2018   Procedure: CARDIOVERSION;  Surgeon: Acie Fredrickson Wonda Cheng, MD;  Location: [facility];  Service: Cardiovascular;  Laterality: Left;   CARDIOVERSION N/A 01/08/2020   Procedure: CARDIOVERSION;  Surgeon: [name], MD;  Location: [facility] ENDOSCOPY;  Service: Cardiovascular;  Laterality: N/A;   Hemilaminectomy and microdiskectomy at L4-5 on the left.  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   Procedure: PACEMAKER IMPLANT;  Surgeon: Deboraha Sprang, MD;  Location: [place] [facility];  Service: Cardiovascular;  Laterality: N/A;   TEE WITHOUT CARDIOVERSION N/A 06/05/2018   Procedure: TRANSESOPHAGEAL ECHOCARDIOGRAM (TEE);  Surgeon: Lelon Perla, MD;  Location: [facility] ENDOSCOPY;  Service: Cardiovascular;  Laterality: N/A;  loop    There were no vitals filed for this visit.   Subjective Assessment - 10/12/20 1538     Subjective Has been doing the exercises with eyes closed at home, going well.    Patient is accompained by: Family member    Pertinent History CAD, hx of CVA, PAF s/p DCCV, BPPV, a-fib, HLD    Patient Stated Goals Pt would like to be able to live normally with moving and walking easier.    Currently in Pain? No/denies                               [facility] Adult PT Treatment/Exercise - 10/12/20 1544       Ambulation/Gait   Ambulation/Gait Yes    Ambulation/Gait Assistance 5: Supervision;4: Min guard    Ambulation/Gait Assistance Details working on relaxing BUE with more reciprocal arm swing, head turns and nods, one instance of speeding up, pt reporting more SOB afterwards rating RPE as 8/10, needing seated rest break afterwards. For gait throughout remainder of session, cues for arm swing    Ambulation Distance (Feet) 345 Feet    Assistive device None  Gait Pattern Step-through pattern;Decreased step length - right;Decreased step length - left    Ambulation Surface Level;Indoor                 Balance Exercises - 10/12/20 1559       Balance Exercises: Standing   Standing Eyes Opened Foam/compliant surface    Standing Eyes Opened Limitations hip width distance, 2 x 5 reps head turns, 2 x 5 reps head nods, pt rating mild dizziness    Standing Eyes Closed Foam/compliant surface;Limitations    Standing Eyes Closed Limitations on rockerboard in M/L direction with fingertip support 3 x 15 seconds    Rockerboard Anterior/posterior;Lateral;EO;Limitations    Rockerboard Limitations weight shifting in M/L direction  x15 reps with cues for full weight shift 2 x 10 reps in A/P direction - cues for hip/ankle strategy, mild/mod dizziness when weight shifting in A/P direction, needing standing rest break in between each set    Other Standing Exercises standing with feet apart in corner holding ball and tracking with eyes/head in "X" and "T" directions -2 x 4 reps each direction, rating 4-5/10 dizziness after completion               PT Education - 10/12/20 1624     Education Details schedule changes/changing around pt appts    Person(s) Educated Patient    Methods Explanation    Comprehension Verbalized understanding              PT Short Term Goals - 10/08/20 1211       PT SHORT TERM GOAL #1   Title ALL STS = LTGS               PT Long Term Goals - 10/08/20 1211       PT LONG TERM GOAL #1   Title Pt will be independent with progressive HEP for strength, balance and functional mobility to continue gains on own. ALL LTGS DUE 11/05/20    Baseline reviewed on 10/08/20, pt will benefit from ongoing additions/revisions.    Time 4    Period Weeks    Status On-going    Target Date 11/05/20      PT LONG TERM GOAL #2   Title Pt will increase gait speed to >0.90 m/s without AD for improved gait safety.    Baseline 12.97 seconds = .77 m/s    Time 4    Period Weeks    Status Revised      PT LONG TERM GOAL #3   Title Pt will ambulate >800' on varied surfaces with LRAD mod I for improved community mobility.    Time 4    Period Weeks    Status On-going      PT LONG TERM GOAL #4   Title Pt will increase FGA from 18 to >21/30 for improved balance and gait safety.    Baseline 18/30 on 10/06/20    Time 4    Period Weeks    Status Revised      PT LONG TERM GOAL #5   Title Pt will perform all items on the MSQ and rate as a 0/5 in order to demo decr motional sensitivity during gait and transfers.    Time 4    Period Weeks    Status New                   Plan - 10/12/20 1626      Clinical Impression Statement Today's skilled session focused on  balance strategies on compliant surfaces - with weight shifting and adding in head and eye movements. Pt rating mild/mod dizziness with activities (esp with weight shifting in A/P direction) that subsided with standing rest breaks. Will continue to progress towards LTGs.    Personal Factors and Comorbidities Age;Comorbidity 3+    Comorbidities CAD, hx of CVA, PAF s/p DCCV, BPPV, a-fib, HLD, seizure    Examination-Activity Limitations Bend;Bathing;Carry;Stand;Squat;Stairs;Toileting;Dressing;Transfers;Locomotion Level    Examination-Participation Restrictions Church;Yard Work;Driving;Community Activity;Laundry;Meal Prep;Cleaning    Stability/Clinical Decision Making Evolving/Moderate complexity    Rehab Potential Good    PT Frequency 2x / week    PT Duration 4 weeks    PT Treatment/Interventions ADLs/Self Care Home Management;Aquatic Therapy;Biofeedback;Canalith Repostioning;Balance training;Therapeutic exercise;Manual techniques;Vestibular;Therapeutic activities;Functional mobility training;Stair training;Gait training;Patient/family education;DME Instruction;Neuromuscular re-education    PT Next Visit Plan Has pacemaker set at 28. Continue gait w/o AD working on increasing step length. balance with vision removed, retro gait and head turns/nods. Continue VOR x 1, progress time to 60 seconds in seated.  continue to work on turns with no AD.    PT Home Exercise Plan [email or web address], plus seated VOR x1.    Consulted and Agree with Plan of Care Patient;Family member/caregiver    Family Member Consulted pt's husband: [name]             Patient will benefit from skilled therapeutic intervention in order to improve the following deficits and impairments:  Abnormal gait, Decreased endurance, Impaired sensation, Cardiopulmonary status limiting activity, Decreased knowledge of use of DME, Decreased activity tolerance, Decreased strength,  Decreased balance, Decreased mobility, Dizziness, Impaired flexibility  Visit Diagnosis: Other abnormalities of gait and mobility  Dizziness and giddiness  Muscle weakness (generalized)  Unsteadiness on feet     Problem List Patient Active Problem List   Diagnosis Date Noted   A-fib ([place]) 07/06/2020   Pacemaker - MDT 07/06/2020   New onset seizure ([place]) 06/28/2020   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   Hemiplegia and hemiparesis following cerebral infarction affecting left non-dominant side ([place]) 02/05/2020   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[place]) 01/14/2020   CVA (cerebral vascular accident) ([place]) 01/09/2020   Dizziness 11/30/2018   Paroxysmal atrial fibrillation ([place]) 08/13/2018   Prediabetes 06/15/2018   Palpitations 06/05/2018   Essential hypertension 06/05/2018   Hyperlipidemia LDL goal <70 06/05/2018   Acute ischemic right MCA stroke ([place]) 06/05/2018   Stroke (cerebrum) ([place]) - R MCA s/p tPA, embolic, source unknown 50/53/9767   SVT (supraventricular tachycardia) ([place]) 05/28/2015   PVC's (premature ventricular contractions) 05/28/2015   BENIGN POSITIONAL VERTIGO 06/16/2009   ALLERGIC RHINITIS 06/16/2009    Arliss Journey, PT, DPT  10/12/2020, 4:28 PM  [facility] [address] [place], [place], [address] Phone: [phone]   Fax:  [phone]  Name: Mary Mercado MRN: [id number] Date of Birth: 03-01-1941

## 2020-10-16 ENCOUNTER — Other Ambulatory Visit: Payer: Self-pay

## 2020-10-16 ENCOUNTER — Ambulatory Visit: Payer: Medicare Other

## 2020-10-16 VITALS — BP 132/83 | HR 80

## 2020-10-16 DIAGNOSIS — R42 Dizziness and giddiness: Secondary | ICD-10-CM

## 2020-10-16 DIAGNOSIS — M6281 Muscle weakness (generalized): Secondary | ICD-10-CM | POA: Diagnosis not present

## 2020-10-16 DIAGNOSIS — R2681 Unsteadiness on feet: Secondary | ICD-10-CM | POA: Diagnosis not present

## 2020-10-16 DIAGNOSIS — R2689 Other abnormalities of gait and mobility: Secondary | ICD-10-CM

## 2020-10-16 NOTE — Therapy (Signed)
[facility] [address] [place], [place], [address] Phone: [phone]   Fax:  [phone]  Physical Therapy Treatment  Patient Details  Name: Mary Mercado MRN: [id number] Date of Birth: [birth year] Referring Provider (PT): [name]   Encounter Date: 10/16/2020   PT End of Session - 10/16/20 1151     Visit Number 18    Number of Visits 24    Date for PT Re-Evaluation 02/40/97   per re-cert on 3/53/29   Authorization Type Medicare so 10th visit progress note    PT Start Time 1148    PT Stop Time 1230    PT Time Calculation (min) 42 min    Equipment Utilized During Treatment Gait belt    Activity Tolerance Patient tolerated treatment well    Behavior During Therapy [facility] for tasks assessed/performed             Past Medical History:  Diagnosis Date   Allergy    CVA (cerebral vascular accident) ([place]) 05/2018, 01/09/20   Hyperlipidemia LDL goal <70    Hypertension    Palpitations    Vertigo, benign positional     Past Surgical History:  Procedure Laterality Date   AV NODE ABLATION N/A 03/30/2020   Procedure: AV NODE ABLATION;  Surgeon: Deboraha Sprang, MD;  Location: [facility];  Service: Cardiovascular;  Laterality: N/A;   AV NODE ABLATION N/A 03/30/2020   Procedure: AV NODE ABLATION;  Surgeon: Deboraha Sprang, MD;  Location: [place] [facility];  Service: Cardiovascular;  Laterality: N/A;   CARDIOVERSION Left 10/31/2018   Procedure: CARDIOVERSION;  Surgeon: Acie Fredrickson Wonda Cheng, MD;  Location: [facility];  Service: Cardiovascular;  Laterality: Left;   CARDIOVERSION N/A 01/08/2020   Procedure: CARDIOVERSION;  Surgeon: [name], MD;  Location: [facility] ENDOSCOPY;  Service: Cardiovascular;  Laterality: N/A;   Hemilaminectomy and microdiskectomy at L4-5 on the left.  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   Procedure: PACEMAKER IMPLANT;  Surgeon: Deboraha Sprang, MD;  Location: [place] [facility];  Service: Cardiovascular;  Laterality: N/A;   TEE WITHOUT CARDIOVERSION N/A 06/05/2018   Procedure: TRANSESOPHAGEAL ECHOCARDIOGRAM (TEE);  Surgeon: Lelon Perla, MD;  Location: [facility] ENDOSCOPY;  Service: Cardiovascular;  Laterality: N/A;  loop    Vitals:   10/16/20 1153  BP: 132/83  Pulse: 80     Subjective Assessment - 10/16/20 1151     Subjective Patient reports felt great this  morning, then around 10am she began to feel shaky. Decided to eat a PB sandwhich. Reports it has improved. Does not take her blood sugar. No falls. No pain.    Patient is accompained by: Family member    Pertinent History CAD, hx of CVA, PAF s/p DCCV, BPPV, a-fib, HLD    Patient Stated Goals Pt would like to be able to live normally with moving and walking easier.    Currently in Pain? No/denies                               [facility] Adult PT Treatment/Exercise - 10/16/20 0001       Ambulation/Gait   Ambulation/Gait Yes    Ambulation/Gait Assistance 5: Supervision;4: Min guard    Ambulation/Gait Assistance Details completed ambulation x 230 ft working on improved bilat step length and arm swing, as patient begins to speak during ambublation tends to tense up require cues from PT.  Then progressed to ambulate x 230 ft with negotiating over obstacles (orange hurdles), PT providing cues for focusing on controlling the foot down when stepping over. BP after completion: 137/87, HR: 80.    Ambulation Distance (Feet) 230 Feet    Assistive device None    Gait Pattern Step-through pattern;Decreased step length - right;Decreased step length - left    Ambulation Surface Level;Indoor                 Balance Exercises - 10/16/20 0001       Balance Exercises: Standing   Step Ups Forward;6 inch;Limitations    Step Ups Limitations standing with BLE on airex completed alternating step ups on 6" step without UE support x 10 reps bilaterally.    Balance Beam standing across red  balance beam with feet hip width, completed static standing holding steady x 10 seconds, then progressed to horizontal/vertical head turns x 10 reps. CGA. Completed eyes closed 3 x 30 seconds, increased sway but no UE support required to maintain balance. continue to demo increased sway with vertical > horizontal.    Retro Gait 3 reps;Limitations    Retro Gait Limitations completed retro gait x 3 laps down and back in // bars no UE support, cues for step length.    Marching Solid surface;Forwards;Dynamic;Limitations    Marching Limitations completed forwad marching gait x 3 laps down and back in // bars no UE support, cues for slowed control pace to promote SLS.    Other Standing Exercises after activities, BP: 129/82, HR: 80               PT Education - 10/16/20 1239     Education Details ambulating in house without AD    Person(s) Educated Patient;Spouse    Methods Explanation    Comprehension Verbalized understanding              PT Short Term Goals - 10/08/20 1211       PT SHORT TERM GOAL #1   Title ALL STS = LTGS               PT Long Term Goals - 10/08/20 1211       PT LONG TERM GOAL #1   Title Pt will be independent with progressive HEP for strength, balance and functional mobility to continue gains on own. ALL LTGS DUE 11/05/20    Baseline reviewed on 10/08/20, pt will benefit from ongoing additions/revisions.    Time 4    Period Weeks    Status On-going    Target Date 11/05/20      PT LONG TERM GOAL #2   Title Pt will increase gait speed to >0.90 m/s without AD for improved gait safety.    Baseline 12.97 seconds = .77 m/s    Time 4    Period Weeks    Status Revised      PT LONG TERM GOAL #3   Title Pt will ambulate >800' on varied surfaces with LRAD mod I for improved community mobility.    Time 4    Period Weeks    Status On-going      PT LONG TERM GOAL #4   Title Pt will increase FGA from 18 to >21/30 for improved balance and gait safety.     Baseline 18/30 on 10/06/20    Time 4    Period Weeks    Status Revised      PT LONG TERM GOAL #5   Title Pt  will perform all items on the MSQ and rate as a 0/5 in order to demo decr motional sensitivity during gait and transfers.    Time 4    Period Weeks    Status New                   Plan - 10/16/20 1224     Clinical Impression Statement Today's skilled PT session focused on gait training working on step length and negotiation over obstacles with patient tolerating well. Continue balance activities focused on head turns and complaint surfaces with patient tolerating well.Patient continue to demo decreased endurance requiring frequent rest breaks. Will continue to progress toward all LTGs.    Personal Factors and Comorbidities Age;Comorbidity 3+    Comorbidities CAD, hx of CVA, PAF s/p DCCV, BPPV, a-fib, HLD, seizure    Examination-Activity Limitations Bend;Bathing;Carry;Stand;Squat;Stairs;Toileting;Dressing;Transfers;Locomotion Level    Examination-Participation Restrictions Church;Yard Work;Driving;Community Activity;Laundry;Meal Prep;Cleaning    Stability/Clinical Decision Making Evolving/Moderate complexity    Rehab Potential Good    PT Frequency 2x / week    PT Duration 4 weeks    PT Treatment/Interventions ADLs/Self Care Home Management;Aquatic Therapy;Biofeedback;Canalith Repostioning;Balance training;Therapeutic exercise;Manual techniques;Vestibular;Therapeutic activities;Functional mobility training;Stair training;Gait training;Patient/family education;DME Instruction;Neuromuscular re-education    PT Next Visit Plan Has pacemaker set at 62. Continue gait w/o AD working on increasing step length. balance with vision removed, retro gait and head turns/nods. Continue VOR x 1, progress time to 60 seconds in seated.  continue to work on turns with no AD. try scifit/nustep for endurance    PT Home Exercise Plan [id number], plus seated VOR x1.    Consulted and Agree with Plan of  Care Patient;Family member/caregiver    Family Member Consulted pt's husband: [name]             Patient will benefit from skilled therapeutic intervention in order to improve the following deficits and impairments:  Abnormal gait, Decreased endurance, Impaired sensation, Cardiopulmonary status limiting activity, Decreased knowledge of use of DME, Decreased activity tolerance, Decreased strength, Decreased balance, Decreased mobility, Dizziness, Impaired flexibility  Visit Diagnosis: Other abnormalities of gait and mobility  Muscle weakness (generalized)  Unsteadiness on feet  Dizziness and giddiness     Problem List Patient Active Problem List   Diagnosis Date Noted   A-fib ([place]) 07/06/2020   Pacemaker - MDT 07/06/2020   New onset seizure ([place]) 06/28/2020   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   Hemiplegia and hemiparesis following cerebral infarction affecting left non-dominant side ([place]) 02/05/2020   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   CVA (cerebral vascular accident) ([place]) 01/09/2020   Dizziness 11/30/2018   Paroxysmal atrial fibrillation ([place]) 08/13/2018   Prediabetes 06/15/2018   Palpitations 06/05/2018   Essential hypertension 06/05/2018   Hyperlipidemia LDL goal <70 06/05/2018   Acute ischemic right MCA stroke ([place]) 06/05/2018   Stroke (cerebrum) ([place]) - R MCA s/p tPA, embolic, source unknown 13/88/7195   SVT (supraventricular tachycardia) ([place]) 05/28/2015   PVC's (premature ventricular contractions) 05/28/2015   BENIGN POSITIONAL VERTIGO 06/16/2009   ALLERGIC RHINITIS 06/16/2009    Jones Bales, PT, DPT 10/16/2020, 12:41 PM  [facility] [address] [place], [place], [address] Phone: [phone]   Fax:  [phone]  Name: Aliegha Paullin MRN: [id number] Date of Birth: [birth year]

## 2020-10-19 ENCOUNTER — Ambulatory Visit: Payer: Medicare Other | Admitting: Physical Therapy

## 2020-10-19 ENCOUNTER — Telehealth: Payer: Self-pay | Admitting: *Deleted

## 2020-10-19 NOTE — Telephone Encounter (Signed)
-----   Message from Garvin Fila, MD sent at 10/19/2020  5:29 PM EDT ----- Mitchell Heir inform the patient that EEG study was normal

## 2020-10-19 NOTE — Progress Notes (Signed)
Kindly inform the patient that EEG study was normal

## 2020-10-19 NOTE — Telephone Encounter (Signed)
Left patient a detailed message, with results, on voicemail (ok per DPR).  Provided our number to call back with any questions.  

## 2020-10-23 ENCOUNTER — Ambulatory Visit: Payer: Medicare Other

## 2020-10-23 ENCOUNTER — Ambulatory Visit: Payer: Medicare Other | Admitting: Physical Therapy

## 2020-10-23 NOTE — Progress Notes (Signed)
Remote pacemaker transmission.   

## 2020-10-28 ENCOUNTER — Ambulatory Visit: Payer: Medicare Other | Attending: Physical Medicine & Rehabilitation

## 2020-10-28 ENCOUNTER — Other Ambulatory Visit: Payer: Self-pay

## 2020-10-28 VITALS — BP 146/80 | HR 80

## 2020-10-28 DIAGNOSIS — R2681 Unsteadiness on feet: Secondary | ICD-10-CM

## 2020-10-28 DIAGNOSIS — R2689 Other abnormalities of gait and mobility: Secondary | ICD-10-CM | POA: Insufficient documentation

## 2020-10-28 DIAGNOSIS — R42 Dizziness and giddiness: Secondary | ICD-10-CM

## 2020-10-28 DIAGNOSIS — M6281 Muscle weakness (generalized): Secondary | ICD-10-CM | POA: Diagnosis not present

## 2020-10-28 NOTE — Therapy (Signed)
[facility] [address] [place], [place], [address] Phone: [phone]   Fax:  [phone]  Physical Therapy Treatment  Patient Details  Name: Mary Mercado MRN: [id number] Date of Birth: [birth year] Referring Provider (PT): [name]   Encounter Date: 10/28/2020   PT End of Session - 10/28/20 1100     Visit Number 19    Number of Visits 24    Date for PT Re-Evaluation 66/06/30   per re-cert on 1/60/10   Authorization Type Medicare so 10th visit progress note    PT Start Time 1100    PT Stop Time 1143    PT Time Calculation (min) 43 min    Equipment Utilized During Treatment Gait belt    Activity Tolerance Patient tolerated treatment well    Behavior During Therapy [facility] for tasks assessed/performed             Past Medical History:  Diagnosis Date   Allergy    CVA (cerebral vascular accident) ([place]) 05/2018, 01/09/20   Hyperlipidemia LDL goal <70    Hypertension    Palpitations    Vertigo, benign positional     Past Surgical History:  Procedure Laterality Date   AV NODE ABLATION N/A 03/30/2020   Procedure: AV NODE ABLATION;  Surgeon: Deboraha Sprang, MD;  Location: [place] [facility];  Service: Cardiovascular;  Laterality: N/A;   AV NODE ABLATION N/A 03/30/2020   Procedure: AV NODE ABLATION;  Surgeon: Deboraha Sprang, MD;  Location: [place] [facility];  Service: Cardiovascular;  Laterality: N/A;   CARDIOVERSION Left 10/31/2018   Procedure: CARDIOVERSION;  Surgeon: Acie [place]rickson Wonda Cheng, MD;  Location: [facility];  Service: Cardiovascular;  Laterality: Left;   CARDIOVERSION N/A 01/08/2020   Procedure: CARDIOVERSION;  Surgeon: [name], MD;  Location: [facility] ENDOSCOPY;  Service: Cardiovascular;  Laterality: N/A;   Hemilaminectomy and microdiskectomy at L4-5 on the left.  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   Procedure: PACEMAKER IMPLANT;  Surgeon: Deboraha Sprang, MD;  Location: [place] [facility];  Service: Cardiovascular;  Laterality: N/A;   TEE WITHOUT CARDIOVERSION N/A 06/05/2018   Procedure: TRANSESOPHAGEAL ECHOCARDIOGRAM (TEE);  Surgeon: Lelon Perla, MD;  Location: [facility] ENDOSCOPY;  Service: Cardiovascular;  Laterality: N/A;  loop    Vitals:   10/28/20 1106  BP: (!) 146/80  Pulse: 80     Subjective Assessment - 10/28/20 1103     Subjective Patient reports that they are officially moved. Patient reports that she has been feeling tired since the move. No falls to report. No new changes.    Patient is accompained by: Family member    Pertinent History CAD, hx of CVA, PAF s/p DCCV, BPPV, a-fib, HLD    Patient Stated Goals Pt would like to be able to live normally with moving and walking easier.    Currently in Pain? No/denies                 [facility] Adult PT Treatment/Exercise - 10/28/20 0001       Transfers   Transfers Sit to Stand;Stand to Sit    Sit to Stand 5: Supervision    Stand to Sit 5: Supervision    Comments completed sit <> stand with BLE placed on airex, completed 2 x 5 reps. rest break required between sets due to fatigue.      Ambulation/Gait   Ambulation/Gait Yes    Ambulation/Gait Assistance 5: Supervision;4: Min guard  Ambulation/Gait Assistance Details completed ambulation with rollator x 505 ft without rest break to work on long distance ambulation and endurance training. Patient reports general fatigue at end of ambulation.    Ambulation Distance (Feet) 505 Feet    Assistive device None    Gait Pattern Step-through pattern;Decreased step length - right;Decreased step length - left    Ambulation Surface Level;Indoor      Exercises   Exercises Knee/Hip      Knee/Hip Exercises: Aerobic   Nustep Trialed Nustep on Level 2 x 2 minutes, patient unable to tolerate any more time due to fatigue/SOB. Patient require extended rest break with completion. BP after exercise stable: 146/87. At end of rest break: 140/80.              Vestibular Treatment/Exercise - 10/28/20 0001       Vestibular Treatment/Exercise   Vestibular Treatment Provided Gaze    Gaze Exercises X1 Viewing Horizontal;X1 Viewing Vertical      X1 Viewing Horizontal   Foot Position standing feet apart    Reps 2    Comments x 30 seconds; mild dizziness after completion      X1 Viewing Vertical   Foot Position standing feet apart    Reps 2    Comments x 30 seconds; mild dizziness after completion               PT Short Term Goals - 10/08/20 1211       PT SHORT TERM GOAL #1   Title ALL STS = LTGS               PT Long Term Goals - 10/08/20 1211       PT LONG TERM GOAL #1   Title Pt will be independent with progressive HEP for strength, balance and functional mobility to continue gains on own. ALL LTGS DUE 11/05/20    Baseline reviewed on 10/08/20, pt will benefit from ongoing additions/revisions.    Time 4    Period Weeks    Status On-going    Target Date 11/05/20      PT LONG TERM GOAL #2   Title Pt will increase gait speed to >0.90 m/s without AD for improved gait safety.    Baseline 12.97 seconds = .77 m/s    Time 4    Period Weeks    Status Revised      PT LONG TERM GOAL #3   Title Pt will ambulate >800' on varied surfaces with LRAD mod I for improved community mobility.    Time 4    Period Weeks    Status On-going      PT LONG TERM GOAL #4   Title Pt will increase FGA from 18 to >21/30 for improved balance and gait safety.    Baseline 18/30 on 10/06/20    Time 4    Period Weeks    Status Revised      PT LONG TERM GOAL #5   Title Pt will perform all items on the MSQ and rate as a 0/5 in order to demo decr motional sensitivity during gait and transfers.    Time 4    Period Weeks    Status New                   Plan - 10/28/20 1149     Clinical Impression Statement Progressed VOR to standing today with patient tolerating well. Trialed NuStep but patient only able to tolerate x 2 minutes  to  fatigue/SOB. Therefore focused on long distance ambulation to further promote endurance training. Patient tolerating activities well, vitals stable. Will continue to progress toward all LTGs.    Personal Factors and Comorbidities Age;Comorbidity 3+    Comorbidities CAD, hx of CVA, PAF s/p DCCV, BPPV, a-fib, HLD, seizure    Examination-Activity Limitations Bend;Bathing;Carry;Stand;Squat;Stairs;Toileting;Dressing;Transfers;Locomotion Level    Examination-Participation Restrictions Church;Yard Work;Driving;Community Activity;Laundry;Meal Prep;Cleaning    Stability/Clinical Decision Making Evolving/Moderate complexity    Rehab Potential Good    PT Frequency 2x / week    PT Duration 4 weeks    PT Treatment/Interventions ADLs/Self Care Home Management;Aquatic Therapy;Biofeedback;Canalith Repostioning;Balance training;Therapeutic exercise;Manual techniques;Vestibular;Therapeutic activities;Functional mobility training;Stair training;Gait training;Patient/family education;DME Instruction;Neuromuscular re-education    PT Next Visit Plan Progress Note Due. Has pacemaker set at 21. Continue gait w/o AD working on increasing step length. balance with vision removed, retro gait and head turns/nods. Continue VOR x 1, progress time to 60 seconds in seated.  continue to work on turns with no AD. try scifit/nustep for endurance    PT Home Exercise Plan [id number], plus seated VOR x1.    Consulted and Agree with Plan of Care Patient;Family member/caregiver    Family Member Consulted pt's husband: [name]             Patient will benefit from skilled therapeutic intervention in order to improve the following deficits and impairments:  Abnormal gait, Decreased endurance, Impaired sensation, Cardiopulmonary status limiting activity, Decreased knowledge of use of DME, Decreased activity tolerance, Decreased strength, Decreased balance, Decreased mobility, Dizziness, Impaired flexibility  Visit Diagnosis: Other  abnormalities of gait and mobility  Unsteadiness on feet  Muscle weakness (generalized)  Dizziness and giddiness     Problem List Patient Active Problem List   Diagnosis Date Noted   A-fib ([place]) 07/06/2020   Pacemaker - MDT 07/06/2020   New onset seizure (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[place]) 01/14/2020   CVA (cerebral vascular accident) ([place]) 01/09/2020   Dizziness 11/30/2018   Paroxysmal atrial fibrillation ([place]) 08/13/2018   Prediabetes 06/15/2018   Palpitations 06/05/2018   Essential hypertension 06/05/2018   Hyperlipidemia LDL goal <70 06/05/2018   Acute ischemic right MCA stroke ([place]) 06/05/2018   Stroke (cerebrum) ([place]) - R MCA s/p tPA, embolic, source unknown 16/96/7893   SVT (supraventricular tachycardia) ([place]) 05/28/2015   PVC's (premature ventricular contractions) 05/28/2015   BENIGN POSITIONAL VERTIGO 06/16/2009   ALLERGIC RHINITIS 06/16/2009    Jones Bales, PT, DPT 10/28/2020, 11:51 AM  [facility] [address] [place], [place], [address] Phone: [phone]   Fax:  [phone]  Name: Mary Mercado MRN: [id number] Date of Birth: 1941-02-03

## 2020-10-29 ENCOUNTER — Encounter: Payer: Self-pay | Admitting: Physical Medicine & Rehabilitation

## 2020-10-29 ENCOUNTER — Encounter: Payer: Medicare Other | Attending: Physical Medicine & Rehabilitation | Admitting: Physical Medicine & Rehabilitation

## 2020-10-29 VITALS — BP 133/88 | HR 80 | Temp 98.1°F | Ht 65.0 in | Wt 125.0 lb

## 2020-10-29 DIAGNOSIS — I639 Cerebral infarction, unspecified: Secondary | ICD-10-CM | POA: Diagnosis not present

## 2020-10-29 DIAGNOSIS — R269 Unspecified abnormalities of gait and mobility: Secondary | ICD-10-CM

## 2020-10-29 DIAGNOSIS — I69398 Other sequelae of cerebral infarction: Secondary | ICD-10-CM | POA: Diagnosis not present

## 2020-10-29 NOTE — Patient Instructions (Signed)
Please do Home exercise program on an regular

## 2020-10-29 NOTE — Progress Notes (Signed)
Subjective:    Patient ID: Mary Mercado, female    DOB: 1941-02-23, 80 y.o.   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  80 y.o. Rh-female with history of CAD, right-CVA, PAF s/p DCCV recently; who was admitted on 01/09/2020 with acute onset of dizziness, slurred speech and nausea and vomiting while at cardiologist office.  CTA head/neck showed moderate to severe stenosis left HP and moderate stenosis right P2 segment.  MRI brain showed acute 3 mm infarct in right-ACA and evolution of prior right-MCA infarct.  2D echo showed EF 60 to 65% with biatrial dilatation, moderate PAH and mild to moderate aortic valve sclerosis.  Stroke was felt to be embolic due to A. fib and Eliquis was changed to Pradaxa per Dr. Clydene Fake input.  Therapy evaluations done revealing mild dysarthria with mild memory deficits, vestibular symptoms as well as balance deficits affecting ADLs and mobility.  CIR was recommended due to functional decline. She has had improvement in functional use RUE  and RLE as well as improvement in awareness. She is able to complete ADL tasks at modified independent level.  She requires supervision with mobility and Berg balance score has improved to 48/56.  She is able to use speech strategies at modified independent level and speech intelligibility has improved.  Admit date: 01/14/2020 Discharge date: 01/22/2020 HPI Patient returns today feeling like she is making some good progress in her therapy.  She has had no further seizures.  She has followed up with neurology. No falls at home.  Independent with dressing and bathing.  Ambulates with a Rollator outside the home. She recently moved with her husband to friend's home [place] independent living Pain Inventory Average Pain 0 Pain Right Now 0 My pain is constant and tremble & numbness  LOCATION OF PAIN  hands tremble, left side numbness  BOWEL Number of stools per week: 7  plus Oral laxative use No  Type of laxative none Enema or suppository use No  History of colostomy No  Incontinent No   BLADDER Normal In and out cath, frequency n/a Able to self cath No  Bladder incontinence No  Frequent urination No  Leakage with coughing No  Difficulty starting stream No  Incomplete bladder emptying No    Mobility use a cane use a walker how many minutes can you walk? 500 feet  ability to climb steps?  yes do you drive?  no Do you have any goals in this area?  yes  Function retired I need assistance with the following:  bathing, meal prep, household duties, and shopping Do you have any goals in this area?  yes  Neuro/Psych numbness tremor trouble walking depression  Prior Studies Any changes since last visit?  yes Brain wave study.  Physicians involved in your care Any changes since last visit?  no   Family History  Problem Relation Age of Onset   Colon cancer Father    Diabetes Mother    Heart attack Mother        83   Heart attack Brother 71   Social History   Socioeconomic History   Marital status: Married    Spouse name: [name]   Number of children: Not on file   Years of education: 12+   Highest education level: Not on file  Occupational History    Comment: retired  Tobacco Use   Smoking status: Never   Smokeless tobacco: Never  Vaping Use   Vaping  Use: Never used  Substance and Sexual Activity   Alcohol use: Never   Drug use: Never   Sexual activity: Not on file  Other Topics Concern   Not on file  Social History Narrative   Lives with husband   caffeine none daily   Right Handed   Social Determinants of Health   Financial Resource Strain: Not on file  Food Insecurity: Not on file  Transportation Needs: Not on file  Physical Activity: Not on file  Stress: Not on file  Social Connections: Not on file   Past Surgical History:  Procedure Laterality Date   AV NODE ABLATION N/A 03/30/2020   Procedure: AV  NODE ABLATION;  Surgeon: Deboraha Sprang, MD;  Location: [facility];  Service: Cardiovascular;  Laterality: N/A;   AV NODE ABLATION N/A 03/30/2020   Procedure: AV NODE ABLATION;  Surgeon: Deboraha Sprang, MD;  Location: [facility];  Service: Cardiovascular;  Laterality: N/A;   CARDIOVERSION Left 10/31/2018   Procedure: CARDIOVERSION;  Surgeon: Acie Fredrickson Wonda Cheng, MD;  Location: [facility];  Service: Cardiovascular;  Laterality: Left;   CARDIOVERSION N/A 01/08/2020   Procedure: CARDIOVERSION;  Surgeon: [name], MD;  Location: [facility] ENDOSCOPY;  Service: Cardiovascular;  Laterality: N/A;   Hemilaminectomy and microdiskectomy at L4-5 on the left.  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   Procedure: PACEMAKER IMPLANT;  Surgeon: Deboraha Sprang, MD;  Location: [place] [facility];  Service: Cardiovascular;  Laterality: N/A;   TEE WITHOUT CARDIOVERSION N/A 06/05/2018   Procedure: TRANSESOPHAGEAL ECHOCARDIOGRAM (TEE);  Surgeon: Lelon Perla, MD;  Location[facility] ENDOSCOPY;  Service: Cardiovascular;  Laterality: N/A;  loop   Past Medical History:  Diagnosis Date   Allergy    CVA (cerebral vascular accident) ([place]) 05/2018, 01/09/20   Hyperlipidemia LDL goal <70    Hypertension    Palpitations    Vertigo, benign positional    BP 133/88   Pulse 80   Temp 98.1 F (36.7 C)   Ht 5\' 5"  (1.651 m)   Wt 125 lb (56.7 kg)   SpO2 99%   BMI 20.80 kg/m   Opioid Risk Score:   Fall Risk Score:  `1  Depression screen PHQ 2/9  Depression screen [facility] 2/9 02/07/2020 09/20/2018  Decreased Interest 2 0  Down, Depressed, Hopeless 0 1  PHQ - 2 Score 2 1  Altered sleeping 1 -  Tired, decreased energy 0 -  Change in appetite 0 -  Feeling bad or failure about yourself  0 -  Trouble concentrating 0 -  Moving slowly or fidgety/restless 2 -  Suicidal thoughts 0 -  PHQ-9 Score 5 -  Some recent data might be hidden    Review of Systems  Neurological:  Positive for tremors and  numbness.  All other systems reviewed and are negative.     Objective:   Physical Exam Vitals and nursing note reviewed.  Constitutional:      Appearance: She is normal weight.  HENT:     Head: Normocephalic and atraumatic.  Eyes:     Extraocular Movements: Extraocular movements intact.     Conjunctiva/sclera: Conjunctivae normal.     Pupils: Pupils are equal, round, and reactive to light.  Musculoskeletal:     Comments: No pain with upper limb or lower limb range of motion. No evidence of joint swelling  Skin:    General: Skin is warm and dry.  Neurological:     General: No focal  deficit present.     Mental Status: She is alert and oriented to person, place, and time.     Motor: No tremor or abnormal muscle tone.     Coordination: Coordination is intact.     Gait: Gait abnormal and tandem walk abnormal.     Comments: Motor strength is 5/5 bilateral deltoid bicep tricep grip hip flexion knee extension ankle dorsiflexion Positive Romberg Unable to perform tandem gait She requires close supervision for ambulation without assistive device.  Psychiatric:        Mood and Affect: Mood normal.          Assessment & Plan:  1.  Right ACA, MCA infarcts very good recovery of motor function.  Still has balance issues and some truncal instability/truncal ataxia.  I think these are likely to be permanent. She will complete her outpatient therapy She has plateaued in her functioning and I think she does not require any physical medicine and rehab follow-up at this time. Recommend that she continues her home exercise program outlined by PT OT at least every other day if not every day 2.  History of seizure disorder follow-up with neurology no driving as per [place]ace law until seizure-free x6 months and cleared by neurology.

## 2020-10-30 ENCOUNTER — Ambulatory Visit: Payer: Medicare Other

## 2020-10-30 ENCOUNTER — Other Ambulatory Visit: Payer: Self-pay

## 2020-10-30 VITALS — BP 126/81 | HR 80

## 2020-10-30 DIAGNOSIS — R2689 Other abnormalities of gait and mobility: Secondary | ICD-10-CM | POA: Diagnosis not present

## 2020-10-30 DIAGNOSIS — R42 Dizziness and giddiness: Secondary | ICD-10-CM

## 2020-10-30 DIAGNOSIS — M6281 Muscle weakness (generalized): Secondary | ICD-10-CM

## 2020-10-30 DIAGNOSIS — R2681 Unsteadiness on feet: Secondary | ICD-10-CM

## 2020-10-30 NOTE — Therapy (Signed)
[facility] [address] [place], [place], [address] Phone: [phone]   Fax:  [phone]  Physical Therapy Treatment/Progress Note  Patient Details  Name: Mary Mercado MRN: [id number] Date of Birth: [birth year] Referring Provider (PT): [name]  Physical Therapy Progress Note   Dates of Reporting Period: 09/22/2020 - 10/30/2020  See Note below for Objective Data and Assessment of Progress/Goals.  Thank you for the referral of this patient. Guillermina [place], PT, DPT   Encounter Date: 10/30/2020   PT End of Session - 10/30/20 1145     Visit Number 20    Number of Visits 24    Date for PT Re-Evaluation 04/26/70   per re-cert on 5/36/64   Authorization Type Medicare so 10th visit progress note    PT Start Time 4034    PT Stop Time 1228    PT Time Calculation (min) 43 min    Equipment Utilized During Treatment Gait belt    Activity Tolerance Patient tolerated treatment well    Behavior During Therapy WFL for tasks assessed/performed             Past Medical History:  Diagnosis Date   Allergy    CVA (cerebral vascular accident) ([place]) 05/2018, 01/09/20   Hyperlipidemia LDL goal <70    Hypertension    Palpitations    Vertigo, benign positional     Past Surgical History:  Procedure Laterality Date   AV NODE ABLATION N/A 03/30/2020   Procedure: AV NODE ABLATION;  Surgeon: Deboraha Sprang, MD;  Location: [place];  Service: Cardiovascular;  Laterality: N/A;   AV NODE ABLATION N/A 03/30/2020   Procedure: AV NODE ABLATION;  Surgeon: Deboraha Sprang, MD;  Location: [facility];  Service: Cardiovascular;  Laterality: N/A;   CARDIOVERSION Left 10/31/2018   Procedure: CARDIOVERSION;  Surgeon: Acie Fredrickson Wonda Cheng, MD;  Location: [facility];  Service: Cardiovascular;  Laterality: Left;   CARDIOVERSION N/A 01/08/2020   Procedure: CARDIOVERSION;  Surgeon: [name], MD;  Location: [facility]  ENDOSCOPY;  Service: Cardiovascular;  Laterality: N/A;   Hemilaminectomy and microdiskectomy at L4-5 on the left.  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   Procedure: PACEMAKER IMPLANT;  Surgeon: Deboraha Sprang, MD;  Location: [facility];  Service: Cardiovascular;  Laterality: N/A;   TEE WITHOUT CARDIOVERSION N/A 06/05/2018   Procedure: TRANSESOPHAGEAL ECHOCARDIOGRAM (TEE);  Surgeon: Lelon Perla, MD;  Location[facility];  Service: Cardiovascular;  Laterality: N/A;  loop    Vitals:   10/30/20 1153  BP: 126/81  Pulse: 80     Subjective Assessment - 10/30/20 1147     Subjective Patient reports no new changes. No falls to report. Reports has a long distance to ambulate to the dining area at new facility. Reports appt with Dr. Letta Pate went well.    Patient is accompained by: Family member    Pertinent History CAD, hx of CVA, PAF s/p DCCV, BPPV, a-fib, HLD    Patient Stated Goals Pt would like to be able to live normally with moving and walking easier.    Currently in Pain? No/denies                               [facility] Adult PT Treatment/Exercise - 10/30/20 0001       Ambulation/Gait   Ambulation/Gait Yes    Ambulation/Gait Assistance 5: Supervision  Ambulation/Gait Assistance Details continued gait training and ambulation without AD, working on improved bilat step length and arm swing. Continue to be limited in gait distance due to fatigue. Completed gait training  x 230 ft each, x 345 ft with second trial.    Ambulation Distance (Feet) 230 Feet   , 1 x 345 ft   Assistive device None    Gait Pattern Step-through pattern;Decreased step length - right;Decreased step length - left    Ambulation Surface Level;Indoor                 Balance Exercises - 10/30/20 0001       Balance Exercises: Standing   Standing Eyes Opened Foam/compliant surface;Narrow base of support (BOS);Head turns;Limitations    Standing Eyes Opened  Limitations Narrow BOS and eyes open, horiz/vertical head turns x 10 reps each direction.    Standing Eyes Closed Narrow base of support (BOS);Foam/compliant surface;3 reps;30 secs;Limitations    Standing Eyes Closed Limitations on airex, 3 x 30 seconds eyes closed. increased postural sway noted    Stepping Strategy Anterior;Posterior;Foam/compliant surface;Limitations    Stepping Strategy Limitations completed anterior/posterior stepping strategy off red balance beam without UE support x 15 reps bialterally, close supervision. improved balance noted. increased challenge with ant > post.    Sidestepping Foam/compliant support;Limitations    Sidestepping Limitations completed side stepping down and back blue beam x 3 reps, close supervision and cues for foot placement. intermittent touch A required.    Marching Solid surface;Static;Head turns;Limitations    Marching Limitations Completed static standing marching altenraitng x 10 reps, then progressed to addition of horizonal head turns with completion, increased challenge. CGA reqiured and cues for slowed pace to further promote balance and SLS.    Other Standing Exercises BP after activities: 129/84, HR: 80                 PT Short Term Goals - 10/08/20 1211       PT SHORT TERM GOAL #1   Title ALL STS = LTGS               PT Long Term Goals - 10/08/20 1211       PT LONG TERM GOAL #1   Title Pt will be independent with progressive HEP for strength, balance and functional mobility to continue gains on own. ALL LTGS DUE 11/05/20    Baseline reviewed on 10/08/20, pt will benefit from ongoing additions/revisions.    Time 4    Period Weeks    Status On-going    Target Date 11/05/20      PT LONG TERM GOAL #2   Title Pt will increase gait speed to >0.90 m/s without AD for improved gait safety.    Baseline 12.97 seconds = .77 m/s    Time 4    Period Weeks    Status Revised      PT LONG TERM GOAL #3   Title Pt will ambulate  >800' on varied surfaces with LRAD mod I for improved community mobility.    Time 4    Period Weeks    Status On-going      PT LONG TERM GOAL #4   Title Pt will increase FGA from 18 to >21/30 for improved balance and gait safety.    Baseline 18/30 on 10/06/20    Time 4    Period Weeks    Status Revised      PT LONG TERM GOAL #5   Title Pt will  perform all items on the MSQ and rate as a 0/5 in order to demo decr motional sensitivity during gait and transfers.    Time 4    Period Weeks    Status New                   Plan - 10/30/20 1237     Clinical Impression Statement Continued gait training without AD and followed by progression of balance exercises on various complaint surfaces with patient able to tolerate well. Patient is making steady progress with PT services at this time and will conitnue to benefit from skilled PT services to progress toward all LTGs.    Personal Factors and Comorbidities Age;Comorbidity 3+    Comorbidities CAD, hx of CVA, PAF s/p DCCV, BPPV, a-fib, HLD, seizure    Examination-Activity Limitations Bend;Bathing;Carry;Stand;Squat;Stairs;Toileting;Dressing;Transfers;Locomotion Level    Examination-Participation Restrictions Church;Yard Work;Driving;Community Activity;Laundry;Meal Prep;Cleaning    Stability/Clinical Decision Making Evolving/Moderate complexity    Rehab Potential Good    PT Frequency 2x / week    PT Duration 4 weeks    PT Treatment/Interventions ADLs/Self Care Home Management;Aquatic Therapy;Biofeedback;Canalith Repostioning;Balance training;Therapeutic exercise;Manual techniques;Vestibular;Therapeutic activities;Functional mobility training;Stair training;Gait training;Patient/family education;DME Instruction;Neuromuscular re-education    PT Next Visit Plan Will need re-cert at end of next week to cover last two visits? Has pacemaker set at 23. Continue gait w/o AD working on increasing step length. balance with vision removed, retro gait  and head turns/nods. Continue VOR x 1, progress time to 60 seconds in seated.  continue to work on turns with no AD. try scifit/nustep for endurance    PT Home Exercise Plan [id number], plus seated VOR x1.    Consulted and Agree with Plan of Care Patient;Family member/caregiver    Family Member Consulted pt's husband: [name]             Patient will benefit from skilled therapeutic intervention in order to improve the following deficits and impairments:  Abnormal gait, Decreased endurance, Impaired sensation, Cardiopulmonary status limiting activity, Decreased knowledge of use of DME, Decreased activity tolerance, Decreased strength, Decreased balance, Decreased mobility, Dizziness, Impaired flexibility  Visit Diagnosis: Other abnormalities of gait and mobility  Unsteadiness on feet  Muscle weakness (generalized)  Dizziness and giddiness     Problem List Patient Active Problem List   Diagnosis Date Noted   A-fib ([place]) 07/06/2020   Pacemaker - MDT 07/06/2020   New onset seizure ([place]) 06/28/2020   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   Hemiplegia and hemiparesis following cerebral infarction affecting left non-dominant side ([place]) 02/05/2020   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[place]) 01/14/2020   CVA (cerebral vascular accident) ([place]) 01/09/2020   Dizziness 11/30/2018   Paroxysmal atrial fibrillation ([place]) 08/13/2018   Prediabetes 06/15/2018   Palpitations 06/05/2018   Essential hypertension 06/05/2018   Hyperlipidemia LDL goal <70 06/05/2018   Acute ischemic right MCA stroke ([place]) 06/05/2018   Stroke (cerebrum) ([place]) - R MCA s/p tPA, embolic, source unknown 59/16/3846   SVT (supraventricular tachycardia) ([place]) 05/28/2015   PVC's (premature ventricular contractions) 05/28/2015   BENIGN POSITIONAL VERTIGO 06/16/2009   ALLERGIC RHINITIS 06/16/2009    Jones Bales 10/30/2020, 12:41 PM  [facility] [address] [place], [place], [address] Phone: [phone]   Fax:  [phone]  Name: Mary Mercado MRN: [id number] Date of Birth: Sep 21, 1940

## 2020-11-03 ENCOUNTER — Other Ambulatory Visit: Payer: Self-pay

## 2020-11-03 ENCOUNTER — Ambulatory Visit: Payer: Medicare Other

## 2020-11-03 DIAGNOSIS — R2681 Unsteadiness on feet: Secondary | ICD-10-CM | POA: Diagnosis not present

## 2020-11-03 DIAGNOSIS — R2689 Other abnormalities of gait and mobility: Secondary | ICD-10-CM | POA: Diagnosis not present

## 2020-11-03 DIAGNOSIS — M6281 Muscle weakness (generalized): Secondary | ICD-10-CM

## 2020-11-03 DIAGNOSIS — R42 Dizziness and giddiness: Secondary | ICD-10-CM | POA: Diagnosis not present

## 2020-11-03 NOTE — Therapy (Signed)
[facility] [address] [place], [place], [address] Phone: [phone]   Fax:  [phone]  Physical Therapy Treatment  Patient Details  Name: Mary Mercado MRN: [id number] Date of Birth: [birth year] Referring Provider (PT): [name]   Encounter Date: 11/03/2020   PT End of Session - 11/03/20 1149     Visit Number 21    Number of Visits 24    Date for PT Re-Evaluation 42/35/36   per re-cert on 1/44/31   Authorization Type Medicare so 10th visit progress note    PT Start Time 1147    PT Stop Time 1228    PT Time Calculation (min) 41 min    Equipment Utilized During Treatment Gait belt    Activity Tolerance Patient tolerated treatment well    Behavior During Therapy [facility] for tasks assessed/performed             Past Medical History:  Diagnosis Date   Allergy    CVA (cerebral vascular accident) ([place]) 05/2018, 01/09/20   Hyperlipidemia LDL goal <70    Hypertension    Palpitations    Vertigo, benign positional     Past Surgical History:  Procedure Laterality Date   AV NODE ABLATION N/A 03/30/2020   Procedure: AV NODE ABLATION;  Surgeon: Deboraha Sprang, MD;  Location: [place] [facility];  Service: Cardiovascular;  Laterality: N/A;   AV NODE ABLATION N/A 03/30/2020   Procedure: AV NODE ABLATION;  Surgeon: Deboraha Sprang, MD;  Location: [place] [facility];  Service: Cardiovascular;  Laterality: N/A;   CARDIOVERSION Left 10/31/2018   Procedure: CARDIOVERSION;  Surgeon: Acie Fredrickson Wonda Cheng, MD;  Location: [facility];  Service: Cardiovascular;  Laterality: Left;   CARDIOVERSION N/A 01/08/2020   Procedure: CARDIOVERSION;  Surgeon: [name], MD;  Location: [facility] ENDOSCOPY;  Service: Cardiovascular;  Laterality: N/A;   Hemilaminectomy and microdiskectomy at L4-5 on the left.  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   Procedure: PACEMAKER IMPLANT;  Surgeon: Deboraha Sprang, MD;  Location: [place] [facility];  Service: Cardiovascular;  Laterality: N/A;   TEE WITHOUT CARDIOVERSION N/A 06/05/2018   Procedure: TRANSESOPHAGEAL ECHOCARDIOGRAM (TEE);  Surgeon: Lelon Perla, MD;  Location: [facility] ENDOSCOPY;  Service: Cardiovascular;  Laterality: N/A;  loop    There were no vitals filed for this visit.   Subjective Assessment - 11/03/20 1149     Subjective Reports having a good day today. No new changes/complaints. No pain and no falls.    Patient is accompained by: Family member    Pertinent History CAD, hx of CVA, PAF s/p DCCV, BPPV, a-fib, HLD    Patient Stated Goals Pt would like to be able to live normally with moving and walking easier.    Currently in Pain? No/denies                [facility] PT Assessment - 11/03/20 0001       6 Minute Walk- Baseline   6 Minute Walk- Baseline yes    BP (mmHg) 109/70    HR (bpm) 80    02 Sat (%RA) 98 %    Modified Borg Scale for Dyspnea 0- Nothing at all      6 Minute walk- Post Test   6 Minute Walk Post Test yes    BP (mmHg) 111/75    HR (bpm) 80    02 Sat (%RA) 97 %    Modified Borg Scale for Dyspnea 3- Moderate  shortness of breath or breathing difficulty    Perceived Rate of Exertion (Borg) 13- Somewhat hard      6 minute walk test results    Aerobic Endurance Distance Walked 613    Endurance additional comments completed with Rollator; supervision; standing rest break required due to SOB/Fatigue               [facility] Adult PT Treatment/Exercise - 11/03/20 0001       Transfers   Transfers Sit to Stand;Stand to Sit    Sit to Stand 6: Modified independent (Device/Increase time)    Stand to Sit 6: Modified independent (Device/Increase time)      Ambulation/Gait   Ambulation/Gait Yes    Ambulation/Gait Assistance 5: Supervision    Ambulation/Gait Assistance Details with 6MWT use of Rollator; without AD to check Gait Speed    Ambulation Distance (Feet) --   see assesment tab for 6MWT results   Assistive device  None;Rollator    Gait Pattern Step-through pattern;Decreased step length - right;Decreased step length - left    Ambulation Surface Level;Indoor    Gait velocity 10.78 secs = 0.92 m/s      Exercises   Exercises Other Exercises    Other Exercises  Due to decreased endurance with ambulation, PT educating and providing handout on walkng program for patient to begin within living facility. PT educating to start out with attempting to ambulate approx 5 minutes 3x/day and then begin to increase by 1-2 minutes every 2-3 days until we reach ability to ambulate approx 10 minutes. PT educating on standing rest breaks vs. seated.                    PT Education - 11/03/20 1230     Education Details 6MWT Results; Walking Program (with handout)    Person(s) Educated Patient;Spouse    Methods Explanation;Handout    Comprehension Verbalized understanding              PT Short Term Goals - 10/08/20 1211       PT SHORT TERM GOAL #1   Title ALL STS = LTGS               PT Long Term Goals - 11/03/20 1224       PT LONG TERM GOAL #1   Title Pt will be independent with progressive HEP for strength, balance and functional mobility to continue gains on own. ALL LTGS DUE 11/05/20    Baseline reviewed on 10/08/20, pt will benefit from ongoing additions/revisions.    Time 4    Period Weeks    Status On-going      PT LONG TERM GOAL #2   Title Pt will increase gait speed to >0.90 m/s without AD for improved gait safety.    Baseline 12.97 seconds = .77 m/s; 0.92 m/s without AD    Time 4    Period Weeks    Status Achieved      PT LONG TERM GOAL #3   Title Pt will ambulate >800' on varied surfaces with LRAD mod I for improved community mobility.    Time 4    Period Weeks    Status On-going      PT LONG TERM GOAL #4   Title Pt will increase FGA from 18 to >21/30 for improved balance and gait safety.    Baseline 18/30 on 10/06/20    Time 4    Period Weeks    Status Revised  PT LONG TERM GOAL #5   Title Pt will perform all items on the MSQ and rate as a 0/5 in order to demo decr motional sensitivity during gait and transfers.    Time 4    Period Weeks    Status New                   Plan - 11/03/20 1334     Clinical Impression Statement Began assesment of patient's progress toward LTG's today, patient able to meet LTG #2 improved gait speed to 0.92 m/s without AD. Futher assessed endurance with patient able to ambulate 613 ft with 6MWT and use of rollator. Vitals stable. Paitent demonstrating decreased endurance, therefore PT educating on walking program and handout provided. Will plan to progress toward all LTGs.    Personal Factors and Comorbidities Age;Comorbidity 3+    Comorbidities CAD, hx of CVA, PAF s/p DCCV, BPPV, a-fib, HLD, seizure    Examination-Activity Limitations Bend;Bathing;Carry;Stand;Squat;Stairs;Toileting;Dressing;Transfers;Locomotion Level    Examination-Participation Restrictions Church;Yard Work;Driving;Community Activity;Laundry;Meal Prep;Cleaning    Stability/Clinical Decision Making Evolving/Moderate complexity    Rehab Potential Good    PT Frequency 2x / week    PT Duration 4 weeks    PT Treatment/Interventions ADLs/Self Care Home Management;Aquatic Therapy;Biofeedback;Canalith Repostioning;Balance training;Therapeutic exercise;Manual techniques;Vestibular;Therapeutic activities;Functional mobility training;Stair training;Gait training;Patient/family education;DME Instruction;Neuromuscular re-education    PT Next Visit Plan Finish checking LTG + Re-Cert for 2x/week for 4 weeks. Has pacemaker set at 71. Continue gait w/o AD working on increasing step length. balance with vision removed, retro gait and head turns/nods. Continue VOR x 1, progress time to 60 seconds in seated.  continue to work on turns with no AD. try scifit/nustep for endurance    PT Home Exercise Plan [id number], plus seated VOR x1.    Consulted and Agree with  Plan of Care Patient;Family member/caregiver    Family Member Consulted pt's husband: [name]             Patient will benefit from skilled therapeutic intervention in order to improve the following deficits and impairments:  Abnormal gait, Decreased endurance, Impaired sensation, Cardiopulmonary status limiting activity, Decreased knowledge of use of DME, Decreased activity tolerance, Decreased strength, Decreased balance, Decreased mobility, Dizziness, Impaired flexibility  Visit Diagnosis: Other abnormalities of gait and mobility  Unsteadiness on feet  Muscle weakness (generalized)     Problem List Patient Active Problem List   Diagnosis Date Noted   A-fib ([place]) 07/06/2020   Pacemaker - MDT 07/06/2020   New onset seizure ([place]) 06/28/2020   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   Hemiplegia and hemiparesis following cerebral infarction affecting left non-dominant side ([place]) 02/05/2020   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   CVA (cerebral vascular accident) ([place]) 01/09/2020   Dizziness 11/30/2018   Paroxysmal atrial fibrillation ([place]) 08/13/2018   Prediabetes 06/15/2018   Palpitations 06/05/2018   Essential hypertension 06/05/2018   Hyperlipidemia LDL goal <70 06/05/2018   Acute ischemic right MCA stroke ([place]) 06/05/2018   Stroke (cerebrum) ([place]) - R MCA s/p tPA, embolic, source unknown 01/60/1093   SVT (supraventricular tachycardia) ([place]) 05/28/2015   PVC's (premature ventricular contractions) 05/28/2015   BENIGN POSITIONAL VERTIGO 06/16/2009   ALLERGIC RHINITIS 06/16/2009    Jones Bales, PT, DPT 11/03/2020, 1:37 PM  [facility] [address] [place], [place], [address] Phone: [phone]   Fax:  [phone]  Name: Denisia Harpole MRN: [id number] Date of Birth: August 17, 1940

## 2020-11-06 ENCOUNTER — Other Ambulatory Visit: Payer: Self-pay

## 2020-11-06 ENCOUNTER — Ambulatory Visit: Payer: Medicare Other

## 2020-11-06 VITALS — BP 120/79 | HR 80

## 2020-11-06 DIAGNOSIS — R42 Dizziness and giddiness: Secondary | ICD-10-CM

## 2020-11-06 DIAGNOSIS — R2689 Other abnormalities of gait and mobility: Secondary | ICD-10-CM

## 2020-11-06 DIAGNOSIS — R2681 Unsteadiness on feet: Secondary | ICD-10-CM

## 2020-11-06 DIAGNOSIS — M6281 Muscle weakness (generalized): Secondary | ICD-10-CM | POA: Diagnosis not present

## 2020-11-06 NOTE — Therapy (Signed)
[facility] [address] [place], [place], [address] Phone: [phone]   Fax:  [phone]  Physical Therapy Treatment/Re-Certification  Patient Details  Name: Mary Mercado MRN: [id number] Date of Birth: November 05, 1940 Referring Provider (PT): [name]   Encounter Date: 11/06/2020   PT End of Session - 11/06/20 1148     Visit Number 22    Number of Visits 30    Date for PT Re-Evaluation 12/04/20    Authorization Type Medicare so 10th visit progress note    Progress Note Due on Visit 30    PT Start Time 1146    PT Stop Time 1229    PT Time Calculation (min) 43 min    Equipment Utilized During Treatment Gait belt    Activity Tolerance Patient tolerated treatment well    Behavior During Therapy [facility] for tasks assessed/performed             Past Medical History:  Diagnosis Date   Allergy    CVA (cerebral vascular accident) ([place]) 05/2018, 01/09/20   Hyperlipidemia LDL goal <70    Hypertension    Palpitations    Vertigo, benign positional     Past Surgical History:  Procedure Laterality Date   AV NODE ABLATION N/A 03/30/2020   Procedure: AV NODE ABLATION;  Surgeon: Deboraha Sprang, MD;  Location: [place] [facility];  Service: Cardiovascular;  Laterality: N/A;   AV NODE ABLATION N/A 03/30/2020   Procedure: AV NODE ABLATION;  Surgeon: Deboraha Sprang, MD;  Location: [place] [facility];  Service: Cardiovascular;  Laterality: N/A;   CARDIOVERSION Left 10/31/2018   Procedure: CARDIOVERSION;  Surgeon: Acie Fredrickson Wonda Cheng, MD;  Location: [facility];  Service: Cardiovascular;  Laterality: Left;   CARDIOVERSION N/A 01/08/2020   Procedure: CARDIOVERSION;  Surgeon: [name], MD;  Location: [facility] ENDOSCOPY;  Service: Cardiovascular;  Laterality: N/A;   Hemilaminectomy and microdiskectomy at L4-5 on the left.  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   Procedure: PACEMAKER IMPLANT;  Surgeon: Deboraha Sprang, MD;  Location: [place] [facility];  Service: Cardiovascular;  Laterality: N/A;   TEE WITHOUT CARDIOVERSION N/A 06/05/2018   Procedure: TRANSESOPHAGEAL ECHOCARDIOGRAM (TEE);  Surgeon: Lelon Perla, MD;  Location: [facility] ENDOSCOPY;  Service: Cardiovascular;  Laterality: N/A;  loop    Vitals:   11/06/20 1152  BP: 120/79  Pulse: 80     Subjective Assessment - 11/06/20 1149     Subjective Reports has been doing her walking, feels as endurance is gradually getting better. No new changes. No falls.    Patient is accompained by: Family member    Pertinent History CAD, hx of CVA, PAF s/p DCCV, BPPV, a-fib, HLD    Patient Stated Goals Pt would like to be able to live normally with moving and walking easier.    Currently in Pain? No/denies                [facility] PT Assessment - 11/06/20 0001       Assessment   Medical Diagnosis gait disturbance, post stroke    Referring Provider (PT) [name]      Functional Gait  Assessment   Gait assessed  Yes    Gait Level Surface Walks 20 ft in less than 7 sec but greater than 5.5 sec, uses assistive device, slower speed, mild gait deviations, or deviates 6-10 in outside of the 12 in walkway width.   6.5 secs   Change in  Gait Speed Able to smoothly change walking speed without loss of balance or gait deviation. Deviate no more than 6 in outside of the 12 in walkway width.    Gait with Horizontal Head Turns Performs head turns smoothly with slight change in gait velocity (eg, minor disruption to smooth gait path), deviates 6-10 in outside 12 in walkway width, or uses an assistive device.    Gait with Vertical Head Turns Performs task with slight change in gait velocity (eg, minor disruption to smooth gait path), deviates 6 - 10 in outside 12 in walkway width or uses assistive device    Gait and Pivot Turn Pivot turns safely in greater than 3 sec and stops with no loss of balance, or pivot turns safely within 3 sec and stops with mild  imbalance, requires small steps to catch balance.    Step Over Obstacle Is able to step over 2 stacked shoe boxes taped together (9 in total height) without changing gait speed. No evidence of imbalance.    Gait with Narrow Base of Support Is able to ambulate for 10 steps heel to toe with no staggering.    Gait with Eyes Closed Cannot walk 20 ft without assistance, severe gait deviations or imbalance, deviates greater than 15 in outside 12 in walkway width or will not attempt task.    Ambulating Backwards Walks 20 ft, slow speed, abnormal gait pattern, evidence for imbalance, deviates 10-15 in outside 12 in walkway width.    Steps Alternating feet, must use rail.    Total Score 20                 Vestibular Assessment - 11/06/20 0001       Positional Sensitivities   Sit to Supine No dizziness    Supine to Left Side No dizziness    Supine to Right Side No dizziness    Supine to Sitting Lightheadedness    Right Hallpike No dizziness    Up from Right Hallpike Lightheadedness    Up from Left Hallpike Lightheadedness    Nose to Right Knee No dizziness    Right Knee to Sitting Lightedness    Nose to Left Knee No dizziness    Left Knee to Sitting Lightheadedness    Head Turning x 5 Moderate dizziness    Head Nodding x 5 Moderate dizziness    Pivot Right in Standing Lightheadedness    Pivot Left in Standing Lightheadedness    Rolling Right No dizziness    Rolling Left No dizziness              [facility] Adult PT Treatment/Exercise - 11/06/20 0001       Transfers   Transfers Sit to Stand;Stand to Sit    Sit to Stand 6: Modified independent (Device/Increase time)    Stand to Sit 6: Modified independent (Device/Increase time)      Ambulation/Gait   Ambulation/Gait Yes    Ambulation/Gait Assistance 5: Supervision    Ambulation/Gait Assistance Details Completed ambulation without AD x 700 ft. Patietn did require one instance of standing rest break with completion. SOB: 4-5/10  after completion. Supervision throughout. PT educating to increase walking program to 6 mins. No outdoors surfaces completed today. continued use of rollator into/out of therapy session. Vitals stable after completion: 118/83, HR: 80    Ambulation Distance (Feet) 700 Feet    Assistive device None;Rollator    Gait Pattern Step-through pattern;Decreased step length - right;Decreased step length - left    Ambulation  Surface Level;Indoor                      PT Short Term Goals - 10/08/20 1211       PT SHORT TERM GOAL #1   Title ALL STS = LTGS               PT Long Term Goals - 11/06/20 1203       PT LONG TERM GOAL #1   Title Pt will be independent with progressive HEP for strength, balance and functional mobility to continue gains on own. ALL LTGS DUE 11/05/20    Baseline continue to progress & will benefit from ongoing additions/revisions.    Time 4    Period Weeks    Status On-going      PT LONG TERM GOAL #2   Title Pt will increase gait speed to >0.90 m/s without AD for improved gait safety.    Baseline 12.97 seconds = .77 m/s; 0.92 m/s without AD    Time 4    Period Weeks    Status Achieved      PT LONG TERM GOAL #3   Title Pt will ambulate >800' on varied surfaces with LRAD mod I for improved community mobility.    Baseline 700' without AD supervision    Time 4    Period Weeks    Status Partially Met      PT LONG TERM GOAL #4   Title Pt will increase FGA from 18 to >21/30 for improved balance and gait safety.    Baseline 18/30 on 10/06/20; 20/30    Time 4    Period Weeks    Status Not Met      PT LONG TERM GOAL #5   Title Pt will perform all items on the MSQ and rate as a 0/5 in order to demo decr motional sensitivity during gait and transfers.    Baseline still have moderate dizziness (3/5) with some activities    Time 4    Period Weeks    Status Not Met             Updated Long Term Goals:   PT Long Term Goals - 11/06/20 1256       PT  LONG TERM GOAL #1   Title Pt will be independent with progressive HEP for strength, balance and functional mobility to continue gains on own. ALL LTGS DUE 11/05/20    Baseline continue to progress & will benefit from ongoing additions/revisions.    Time 4    Period Weeks    Status On-going      PT LONG TERM GOAL #2   Title Pt will increase gait speed to >1.0 m/s without AD for improved gait safety.    Baseline 12.97 seconds = .77 m/s; 0.92 m/s without AD    Time 4    Period Weeks    Status Revised      PT LONG TERM GOAL #3   Title Pt will ambulate > 900' with LRAD during 6MWT to demo improvements in endurance    Baseline 613 ft w/ Rollator    Time 4    Period Weeks    Status New      PT LONG TERM GOAL #4   Title Pt will increase FGA from 18 to >22/30 for improved balance and gait safety.    Baseline 18/30 on 10/06/20; 20/30    Time 4    Period Weeks    Status  Revised      PT LONG TERM GOAL #5   Title Pt will perform all items on the MSQ and rate as a 0/5 in order to demo decr motional sensitivity during gait and transfers.    Baseline still have moderate dizziness (3/5) with some activities    Time 4    Period Weeks    Status On-going                  Plan - 11/06/20 1253     Clinical Impression Statement Finished assesment of patient's progress toward LTG. Patient making progress toward all other LTG, but unable to meet at goal level this time. Wiht FGA, patient scored 20/30 demosntrating improvemets in balance but sitll at medium fall risk at this time. Patient also continues to experience moderate dizziness with head turns. With ambulation patient is only able to ambulate 700 ft without AD requiring standing rest breaks continuning to demo decreased endurance. Patient willl continue to benefit from skilled PT services to address impairments and further progress toward all LTGs.    Personal Factors and Comorbidities Age;Comorbidity 3+    Comorbidities CAD, hx of CVA,  PAF s/p DCCV, BPPV, a-fib, HLD, seizure    Examination-Activity Limitations Bend;Bathing;Carry;Stand;Squat;Stairs;Toileting;Dressing;Transfers;Locomotion Level    Examination-Participation Restrictions Church;Yard Work;Driving;Community Activity;Laundry;Meal Prep;Cleaning    Stability/Clinical Decision Making Evolving/Moderate complexity    Rehab Potential Good    PT Frequency 2x / week    PT Duration 4 weeks    PT Treatment/Interventions ADLs/Self Care Home Management;Aquatic Therapy;Biofeedback;Canalith Repostioning;Balance training;Therapeutic exercise;Manual techniques;Vestibular;Therapeutic activities;Functional mobility training;Stair training;Gait training;Patient/family education;DME Instruction;Neuromuscular re-education    PT Next Visit Plan Review + Update HEP. Continue to progress walking program. Has pacemaker set at 80. Continue gait w/o AD working on increasing step length. balance with vision removed, retro gait and head turns/nods. Continue VOR x 1, progress time to 60 seconds in seated.  continue to work on turns with no AD. try scifit/nustep for endurance    PT Home Exercise Plan [email or web address], plus seated VOR x1.    Consulted and Agree with Plan of Care Patient;Family member/caregiver    Family Member Consulted pt's husband: [name]             Patient will benefit from skilled therapeutic intervention in order to improve the following deficits and impairments:  Abnormal gait, Decreased endurance, Impaired sensation, Cardiopulmonary status limiting activity, Decreased knowledge of use of DME, Decreased activity tolerance, Decreased strength, Decreased balance, Decreased mobility, Dizziness, Impaired flexibility  Visit Diagnosis: Other abnormalities of gait and mobility  Unsteadiness on feet  Muscle weakness (generalized)  Dizziness and giddiness     Problem List Patient Active Problem List   Diagnosis Date Noted   A-fib ([place]) 07/06/2020   Pacemaker - MDT 07/06/2020    New onset seizure ([place]) 06/28/2020   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   Hemiplegia and hemiparesis following cerebral infarction affecting left non-dominant side ([place]) 02/05/2020   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[place]) 01/14/2020   CVA (cerebral vascular accident) ([place]) 01/09/2020   Dizziness 11/30/2018   Paroxysmal atrial fibrillation ([place]) 08/13/2018   Prediabetes 06/15/2018   Palpitations 06/05/2018   Essential hypertension 06/05/2018   Hyperlipidemia LDL goal <70 06/05/2018   Acute ischemic right MCA stroke ([place]) 06/05/2018   Stroke (cerebrum) ([place]) - R MCA s/p tPA, embolic, source unknown 38/93/7342   SVT (supraventricular tachycardia) ([place]) 05/28/2015   PVC's (premature ventricular contractions) 05/28/2015   BENIGN POSITIONAL VERTIGO  06/16/2009   ALLERGIC RHINITIS 06/16/2009    Jones Bales, PT, DPT 11/06/2020, 12:55 PM  [facility] [address] [place], [place], [address] Phone: [phone]   Fax:  [phone]  Name: Mary Mercado MRN: [id number] Date of Birth: [birth year]

## 2020-11-09 ENCOUNTER — Other Ambulatory Visit: Payer: Self-pay

## 2020-11-09 ENCOUNTER — Encounter: Payer: Self-pay | Admitting: Physical Therapy

## 2020-11-09 ENCOUNTER — Ambulatory Visit: Payer: Medicare Other | Admitting: Physical Therapy

## 2020-11-09 VITALS — BP 124/85 | HR 80

## 2020-11-09 DIAGNOSIS — M6281 Muscle weakness (generalized): Secondary | ICD-10-CM

## 2020-11-09 DIAGNOSIS — R42 Dizziness and giddiness: Secondary | ICD-10-CM | POA: Diagnosis not present

## 2020-11-09 DIAGNOSIS — R2689 Other abnormalities of gait and mobility: Secondary | ICD-10-CM | POA: Diagnosis not present

## 2020-11-09 DIAGNOSIS — R2681 Unsteadiness on feet: Secondary | ICD-10-CM

## 2020-11-09 NOTE — Patient Instructions (Signed)
Access Code: [email or web address] URL: [email or web address] Date: 11/09/2020 Prepared by: Janann August  Exercises Standing head turns - 1-2 x daily - 5 x weekly - 2 sets - 10 reps Romberg Stance with Head Nods - 1-2 x daily - 5 x weekly - 2 sets - 10 reps Standing Balance with Eyes Closed on Foam - 1-2 x daily - 5 x weekly - 3 sets - 30 reps Standing Marching - 1-2 x daily - 5 x weekly - 3 sets Backward Walking with Counter Support - 1 x daily - 5 x weekly - 3 sets Sit to Stand - 2 x daily - 5 x weekly - 1 sets - 5 reps

## 2020-11-09 NOTE — Therapy (Addendum)
[facility] [address] [place], [place], [address] Phone: [phone]   Fax:  [phone]  Physical Therapy Treatment  Patient Details  Name: Mary Mercado MRN: [id number] Date of Birth: 09-11-1940 Referring Provider (PT): [name]   Encounter Date: 11/09/2020   PT End of Session - 11/09/20 1532     Visit Number 23    Number of Visits 30    Date for PT Re-Evaluation 12/04/20    Authorization Type Medicare so 10th visit progress note    Progress Note Due on Visit 30    PT Start Time 1446    PT Stop Time 1528    PT Time Calculation (min) 42 min    Equipment Utilized During Treatment Gait belt    Activity Tolerance Patient tolerated treatment well    Behavior During Therapy [facility] for tasks assessed/performed             Past Medical History:  Diagnosis Date   Allergy    CVA (cerebral vascular accident) ([place]) 05/2018, 01/09/20   Hyperlipidemia LDL goal <70    Hypertension    Palpitations    Vertigo, benign positional     Past Surgical History:  Procedure Laterality Date   AV NODE ABLATION N/A 03/30/2020   Procedure: AV NODE ABLATION;  Surgeon: Deboraha Sprang, MD;  Location: [place] [facility];  Service: Cardiovascular;  Laterality: N/A;   AV NODE ABLATION N/A 03/30/2020   Procedure: AV NODE ABLATION;  Surgeon: Deboraha Sprang, MD;  Location: [place] [facility];  Service: Cardiovascular;  Laterality: N/A;   CARDIOVERSION Left 10/31/2018   Procedure: CARDIOVERSION;  Surgeon: Acie Fredrickson Wonda Cheng, MD;  Location: [place];  Service: Cardiovascular;  Laterality: Left;   CARDIOVERSION N/A 01/08/2020   Procedure: CARDIOVERSION;  Surgeon: [name], MD;  Location: [facility] ENDOSCOPY;  Service: Cardiovascular;  Laterality: N/A;   Hemilaminectomy and microdiskectomy at L4-5 on the left.  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   Procedure: PACEMAKER IMPLANT;  Surgeon: Deboraha Sprang, MD;   Location: [place] [facility];  Service: Cardiovascular;  Laterality: N/A;   TEE WITHOUT CARDIOVERSION N/A 06/05/2018   Procedure: TRANSESOPHAGEAL ECHOCARDIOGRAM (TEE);  Surgeon: Lelon Perla, MD;  Location: [facility] ENDOSCOPY;  Service: Cardiovascular;  Laterality: N/A;  loop    Vitals:   11/09/20 1454 11/09/20 1525  BP: 130/89 124/85  Pulse: 80 80     Subjective Assessment - 11/09/20 1449     Subjective Has been doing more walking.    Patient is accompained by: Family member    Pertinent History CAD, hx of CVA, PAF s/p DCCV, BPPV, a-fib, HLD    Patient Stated Goals Pt would like to be able to live normally with moving and walking easier.    Currently in Pain? No/denies                               [facility] Adult PT Treatment/Exercise - 11/09/20 1504       Transfers   Transfers Sit to Stand;Stand to Sit    Sit to Stand 6: Modified independent (Device/Increase time)    Stand to Sit 6: Modified independent (Device/Increase time)    Comments performed 6 reps with no UE support with pt feeling fatigued afterwards      Ambulation/Gait   Ambulation/Gait Yes    Ambulation/Gait Assistance 5: Supervision    Ambulation/Gait Assistance Details throughout  session with no AD.    Assistive device None    Gait Pattern Step-through pattern;Decreased step length - right;Decreased step length - left    Ambulation Surface Level;Indoor      Knee/Hip Exercises: Aerobic   Stepper Seated SciFit with BLE only for 5:30 minutes at gear 1.0 for strengthening and activity tolerance. pt tolerating well, reporting feeling short of breath afterwards but able to recover. discussed have pt and pt's spouse take pictures of what seated aerobic machines are at where they live to determine which would be best for pt to improve endurance/strength             Access Code: [email or web address] URL: [email or web address] Date: 11/09/2020 Prepared by: Janann August  Revised/updated pt's  HEP as appropriate. See MedBridge for more details.   Exercises Standing head turns - 1-2 x daily - 5 x weekly - 2 sets - 10 reps - with feet together on  2 pillows  Romberg Stance with Head Nods - 1-2 x daily - 5 x weekly - 2 sets - 10 reps - with feet together on  2 pillows  Standing Balance with Eyes Closed on Foam - 1-2 x daily - 5 x weekly - 3 sets - 30 reps Standing Marching - 1-2 x daily - 5 x weekly - 3 sets - up and down at countertop with intermittent UE support  Backward Walking with Counter Support - 1 x daily - 5 x weekly - 3 sets - cues for wider BOS and step length.  Sit to Stand - 2 x daily - 5 x weekly - 1 sets - 5 reps        PT Education - 11/09/20 1532     Education Details updated/revised HEP.    Person(s) Educated Patient;Spouse    Methods Explanation;Demonstration;Verbal cues    Comprehension Verbalized understanding;Returned demonstration              PT Short Term Goals - 10/08/20 1211       PT SHORT TERM GOAL #1   Title ALL STS = LTGS               PT Long Term Goals - 11/06/20 1256       PT LONG TERM GOAL #1   Title Pt will be independent with progressive HEP for strength, balance and functional mobility to continue gains on own. ALL LTGS DUE 11/05/20    Baseline continue to progress & will benefit from ongoing additions/revisions.    Time 4    Period Weeks    Status On-going      PT LONG TERM GOAL #2   Title Pt will increase gait speed to >1.0 m/s without AD for improved gait safety.    Baseline 12.97 seconds = .77 m/s; 0.92 m/s without AD    Time 4    Period Weeks    Status Revised      PT LONG TERM GOAL #3   Title Pt will ambulate > 900' with LRAD during 6MWT to demo improvements in endurance    Baseline 613 ft w/ Rollator    Time 4    Period Weeks    Status New      PT LONG TERM GOAL #4   Title Pt will increase FGA from 18 to >22/30 for improved balance and gait safety.    Baseline 18/30 on 10/06/20; 20/30    Time 4     Period Weeks    Status Revised  PT LONG TERM GOAL #5   Title Pt will perform all items on the MSQ and rate as a 0/5 in order to demo decr motional sensitivity during gait and transfers.    Baseline still have moderate dizziness (3/5) with some activities    Time 4    Period Weeks    Status On-going                   Plan - 11/09/20 1642     Clinical Impression Statement Initiated the [place] today with BLE only for strengthening/activity tolerance. Pt tolerated well and needing a seated rest break due to SOB afterwards. Remainder of session focused on reviewing/updating pt's HEP for standing balance and strengthening. Will continue to progress towards LTGs.    Personal Factors and Comorbidities Age;Comorbidity 3+    Comorbidities CAD, hx of CVA, PAF s/p DCCV, BPPV, a-fib, HLD, seizure    Examination-Activity Limitations Bend;Bathing;Carry;Stand;Squat;Stairs;Toileting;Dressing;Transfers;Locomotion Level    Examination-Participation Restrictions Church;Yard Work;Driving;Community Activity;Laundry;Meal Prep;Cleaning    Stability/Clinical Decision Making Evolving/Moderate complexity    Rehab Potential Good    PT Frequency 2x / week    PT Duration 4 weeks    PT Treatment/Interventions ADLs/Self Care Home Management;Aquatic Therapy;Biofeedback;Canalith Repostioning;Balance training;Therapeutic exercise;Manual techniques;Vestibular;Therapeutic activities;Functional mobility training;Stair training;Gait training;Patient/family education;DME Instruction;Neuromuscular re-education    PT Next Visit Plan Continue to progress walking program. Has pacemaker set at 80. Continue gait w/o AD working on increasing step length. balance with vision removed, retro gait and head turns/nods. continue to work on turns with no AD. scifit/nustep for endurance    PT Home Exercise Plan [email or web address], plus seated VOR x1.    Consulted and Agree with Plan of Care Patient;Family member/caregiver    Family Member  Consulted pt's husband: [name]             Patient will benefit from skilled therapeutic intervention in order to improve the following deficits and impairments:  Abnormal gait, Decreased endurance, Impaired sensation, Cardiopulmonary status limiting activity, Decreased knowledge of use of DME, Decreased activity tolerance, Decreased strength, Decreased balance, Decreased mobility, Dizziness, Impaired flexibility  Visit Diagnosis: Other abnormalities of gait and mobility  Unsteadiness on feet  Muscle weakness (generalized)     Problem List Patient Active Problem List   Diagnosis Date Noted   A-fib ([place]) 07/06/2020   Pacemaker - MDT 07/06/2020   New onset seizure ([place]) 06/28/2020   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   Hemiplegia and hemiparesis following cerebral infarction affecting left non-dominant side ([place]) 02/05/2020   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   CVA (cerebral vascular accident) ([place]) 01/09/2020   Dizziness 11/30/2018   Paroxysmal atrial fibrillation ([place]) 08/13/2018   Prediabetes 06/15/2018   Palpitations 06/05/2018   Essential hypertension 06/05/2018   Hyperlipidemia LDL goal <70 06/05/2018   Acute ischemic right MCA stroke ([place]) 06/05/2018   Stroke (cerebrum) ([place]) - R MCA s/p tPA, embolic, source unknown 41/96/2229   SVT (supraventricular tachycardia) ([place]) 05/28/2015   PVC's (premature ventricular contractions) 05/28/2015   BENIGN POSITIONAL VERTIGO 06/16/2009   ALLERGIC RHINITIS 06/16/2009    Lillia Pauls, DPT 11/09/2020, 4:43 PM  [facility] [address] [place], [place], [address] Phone: [phone]   Fax:  [phone]  Name: Adaya Garmany MRN: [id number] Date of Birth: 06-Sep-1940

## 2020-11-18 ENCOUNTER — Ambulatory Visit: Payer: Medicare Other | Admitting: Physical Therapy

## 2020-11-18 ENCOUNTER — Encounter: Payer: Self-pay | Admitting: Physical Therapy

## 2020-11-18 ENCOUNTER — Other Ambulatory Visit: Payer: Self-pay

## 2020-11-18 VITALS — BP 133/93 | HR 80

## 2020-11-18 DIAGNOSIS — R2681 Unsteadiness on feet: Secondary | ICD-10-CM | POA: Diagnosis not present

## 2020-11-18 DIAGNOSIS — R2689 Other abnormalities of gait and mobility: Secondary | ICD-10-CM

## 2020-11-18 DIAGNOSIS — M6281 Muscle weakness (generalized): Secondary | ICD-10-CM

## 2020-11-18 DIAGNOSIS — R42 Dizziness and giddiness: Secondary | ICD-10-CM | POA: Diagnosis not present

## 2020-11-18 NOTE — Therapy (Addendum)
[facility] [address] [place], [place], [address] Phone: [phone]   Fax:  [phone]  Physical Therapy Treatment  Patient Details  Name: Mary Mercado MRN: [id number] Date of Birth: January 18, 1941 Referring Provider (PT): [name]   Encounter Date: 11/18/2020   PT End of Session - 11/18/20 1617     Visit Number 24    Number of Visits 30    Date for PT Re-Evaluation 12/04/20    Authorization Type Medicare so 10th visit progress note    Progress Note Due on Visit 30    PT Start Time 1533    PT Stop Time 1615    PT Time Calculation (min) 42 min    Equipment Utilized During Treatment Gait belt    Activity Tolerance Patient tolerated treatment well    Behavior During Therapy [facility] for tasks assessed/performed             Past Medical History:  Diagnosis Date   Allergy    CVA (cerebral vascular accident) ([place]) 05/2018, 01/09/20   Hyperlipidemia LDL goal <70    Hypertension    Palpitations    Vertigo, benign positional     Past Surgical History:  Procedure Laterality Date   AV NODE ABLATION N/A 03/30/2020   Procedure: AV NODE ABLATION;  Surgeon: Deboraha Sprang, MD;  Location: [place] [facility];  Service: Cardiovascular;  Laterality: N/A;   AV NODE ABLATION N/A 03/30/2020   Procedure: AV NODE ABLATION;  Surgeon: Deboraha Sprang, MD;  Location: [place] [facility];  Service: Cardiovascular;  Laterality: N/A;   CARDIOVERSION Left 10/31/2018   Procedure: CARDIOVERSION;  Surgeon: Acie Fredrickson Wonda Cheng, MD;  Location: [place];  Service: Cardiovascular;  Laterality: Left;   CARDIOVERSION N/A 01/08/2020   Procedure: CARDIOVERSION;  Surgeon: [name], MD;  Location: [facility] ENDOSCOPY;  Service: Cardiovascular;  Laterality: N/A;   Hemilaminectomy and microdiskectomy at L4-5 on the left.  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   Procedure: PACEMAKER IMPLANT;  Surgeon: Deboraha Sprang, MD;   Location: [place] [facility];  Service: Cardiovascular;  Laterality: N/A;   TEE WITHOUT CARDIOVERSION N/A 06/05/2018   Procedure: TRANSESOPHAGEAL ECHOCARDIOGRAM (TEE);  Surgeon: Lelon Perla, MD;  Location: [facility] ENDOSCOPY;  Service: Cardiovascular;  Laterality: N/A;  loop    Vitals:   11/18/20 1604  BP: (!) 133/93  Pulse: 80     Subjective Assessment - 11/18/20 1536     Subjective Has been working on her walking at home.    Patient is accompained by: Family member    Pertinent History CAD, hx of CVA, PAF s/p DCCV, BPPV, a-fib, HLD    Patient Stated Goals Pt would like to be able to live normally with moving and walking easier.    Currently in Pain? No/denies                               [facility] Adult PT Treatment/Exercise - 11/18/20 1536       Ambulation/Gait   Ambulation/Gait Yes    Ambulation/Gait Assistance 5: Supervision    Ambulation/Gait Assistance Details plus additional distances in session with rollaotr    Ambulation Distance (Feet) 230 Feet    Assistive device Rollator    Gait Pattern Step-through pattern;Decreased step length - right;Decreased step length - left    Ambulation Surface Level;Indoor      Knee/Hip Exercises: Aerobic   Nustep  pt showed PT picture on her phone that showed that her new living facility has NuSteps in the gym. Trialed the one in clinic. Performed NuStep level 1.0 with BLE and then adding in BUE support after 1 minute. Pt able to perform for 2 minutes before needing a rest break due to SOB, O2 assessed at 98%. With 1-2 minute rest break pt able to continue for an additional minute for 3 working minutes on the NuStep. Discussed with pt and pt's spouse will work on building up pt's endurance so pt can perform at their gym                 11/18/20 0001  Balance Exercises: Standing  Standing Eyes Closed Narrow base of support (BOS);Foam/compliant surface;3 reps;30 secs;Limitations  Standing Eyes Closed Limitations on  air ex with eyes closed with narrow BOS 3 x 30 seconds  SLS with Vectors Foam/compliant surface;Intermittent upper extremity assist;Limitations  SLS with Vectors Limitations standing on blue foam beam; x12 reps B alternating toe taps to cones, beginning with UE support > none  Tandem Gait Forward;Limitations;3 reps  Tandem Gait Limitations on blue balance beam down and back 3 reps, intermittent UE support  Partial Tandem Stance Eyes open;Foam/compliant surface;Limitations  Partial Tandem Stance Limitations 2 x 30 seconds B, during 2nd set pt performing alternating UE lifts  Sidestepping Foam/compliant support;Limitations  Sidestepping Limitations completed side stepping down and back blue beam x 3 reps, close supervision and cues for foot placement/incr SLS time. intermittent touch A required.  Sit to Stand Foam/compliant surface;Without upper extremity support;Limitations  Sit to Stand Limitations 2 x 5 reps on blue air ex, needing seated rest break between each set          PT Short Term Goals - 10/08/20 1211       PT SHORT TERM GOAL #1   Title ALL STS = LTGS               PT Long Term Goals - 11/06/20 1256       PT LONG TERM GOAL #1   Title Pt will be independent with progressive HEP for strength, balance and functional mobility to continue gains on own. ALL LTGS DUE 11/05/20    Baseline continue to progress & will benefit from ongoing additions/revisions.    Time 4    Period Weeks    Status On-going      PT LONG TERM GOAL #2   Title Pt will increase gait speed to >1.0 m/s without AD for improved gait safety.    Baseline 12.97 seconds = .77 m/s; 0.92 m/s without AD    Time 4    Period Weeks    Status Revised      PT LONG TERM GOAL #3   Title Pt will ambulate > 900' with LRAD during 6MWT to demo improvements in endurance    Baseline 613 ft w/ Rollator    Time 4    Period Weeks    Status New      PT LONG TERM GOAL #4   Title Pt will increase FGA from 18 to  >22/30 for improved balance and gait safety.    Baseline 18/30 on 10/06/20; 20/30    Time 4    Period Weeks    Status Revised      PT LONG TERM GOAL #5   Title Pt will perform all items on the MSQ and rate as a 0/5 in order to demo decr motional sensitivity during gait  and transfers.    Baseline still have moderate dizziness (3/5) with some activities    Time 4    Period Weeks    Status On-going                   Plan - 11/19/20 1236     Clinical Impression Statement Trialed the NuStep today (as there is one at pt's gym) with pt able to go 2 minutes consecutively at gear 1.0 before needing to rest due to fatigue/SOB. O2 assessed at 98%. Remainder of session focused on balance strategies on compliant surfaces with pt needing intermittent rest breaks. Will continue to progress towards LTGs.    Personal Factors and Comorbidities Age;Comorbidity 3+    Comorbidities CAD, hx of CVA, PAF s/p DCCV, BPPV, a-fib, HLD, seizure    Examination-Activity Limitations Bend;Bathing;Carry;Stand;Squat;Stairs;Toileting;Dressing;Transfers;Locomotion Level    Examination-Participation Restrictions Church;Yard Work;Driving;Community Activity;Laundry;Meal Prep;Cleaning    Stability/Clinical Decision Making Evolving/Moderate complexity    Rehab Potential Good    PT Frequency 2x / week    PT Duration 4 weeks    PT Treatment/Interventions ADLs/Self Care Home Management;Aquatic Therapy;Biofeedback;Canalith Repostioning;Balance training;Therapeutic exercise;Manual techniques;Vestibular;Therapeutic activities;Functional mobility training;Stair training;Gait training;Patient/family education;DME Instruction;Neuromuscular re-education    PT Next Visit Plan Continue to progress walking program. Has pacemaker set at 80. Continue gait w/o AD working on increasing step length. balance with vision removed, retro gait and head turns/nods. NuStep for endurance    PT Home Exercise Plan [email or web address], plus seated VOR x1.     Consulted and Agree with Plan of Care Patient;Family member/caregiver    Family Member Consulted pt's husband: [name]             Patient will benefit from skilled therapeutic intervention in order to improve the following deficits and impairments:  Abnormal gait, Decreased endurance, Impaired sensation, Cardiopulmonary status limiting activity, Decreased knowledge of use of DME, Decreased activity tolerance, Decreased strength, Decreased balance, Decreased mobility, Dizziness, Impaired flexibility  Visit Diagnosis: Other abnormalities of gait and mobility  Unsteadiness on feet  Muscle weakness (generalized)     Problem List Patient Active Problem List   Diagnosis Date Noted   A-fib (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[place]) 01/14/2020   CVA (cerebral vascular accident) ([place]) 01/09/2020   Dizziness 11/30/2018   Paroxysmal atrial fibrillation ([employer]) 08/13/2018   Prediabetes 06/15/2018   Palpitations 06/05/2018   Essential hypertension 06/05/2018   Hyperlipidemia LDL goal <70 06/05/2018   Acute ischemic right MCA stroke ([place]) 06/05/2018   Stroke (cerebrum) ([place]) - R MCA s/p tPA, embolic, source unknown [id number]   SVT (supraventricular tachycardia) ([place]) 05/28/2015   PVC's (premature ventricular contractions) 05/28/2015   BENIGN POSITIONAL VERTIGO 06/16/2009   ALLERGIC RHINITIS 06/16/2009    Arliss Journey, PT, DPT  11/19/2020, 12:38 PM  [facility] [address] [place], [place], [address] Phone: [phone]   Fax:  [phone]  Name: Mary Mercado MRN: [id number] Date of Birth: 01-12-1941

## 2020-11-19 DIAGNOSIS — H6123 Impacted cerumen, bilateral: Secondary | ICD-10-CM | POA: Diagnosis not present

## 2020-11-20 ENCOUNTER — Encounter: Payer: Self-pay | Admitting: Physical Therapy

## 2020-11-20 ENCOUNTER — Ambulatory Visit: Payer: Medicare Other | Admitting: Physical Therapy

## 2020-11-20 ENCOUNTER — Other Ambulatory Visit: Payer: Self-pay

## 2020-11-20 DIAGNOSIS — R2681 Unsteadiness on feet: Secondary | ICD-10-CM | POA: Diagnosis not present

## 2020-11-20 DIAGNOSIS — M6281 Muscle weakness (generalized): Secondary | ICD-10-CM

## 2020-11-20 DIAGNOSIS — R42 Dizziness and giddiness: Secondary | ICD-10-CM

## 2020-11-20 DIAGNOSIS — R2689 Other abnormalities of gait and mobility: Secondary | ICD-10-CM | POA: Diagnosis not present

## 2020-11-20 NOTE — Therapy (Signed)
[facility] [address] [place], [place], [address] Phone: [phone]   Fax:  [phone]  Physical Therapy Treatment  Patient Details  Name: Mary Mercado MRN: [id number] Date of Birth: [birth year] Referring Provider (PT): [name]   Encounter Date: 11/20/2020   PT End of Session - 11/20/20 1212     Visit Number 25    Number of Visits 30    Date for PT Re-Evaluation 12/04/20    Authorization Type Medicare so 10th visit progress note    Progress Note Due on Visit 30    PT Start Time 1102    PT Stop Time 1143    PT Time Calculation (min) 41 min    Equipment Utilized During Treatment Gait belt    Activity Tolerance Patient tolerated treatment well    Behavior During Therapy [facility] for tasks assessed/performed             Past Medical History:  Diagnosis Date   Allergy    CVA (cerebral vascular accident) ([place]) 05/2018, 01/09/20   Hyperlipidemia LDL goal <70    Hypertension    Palpitations    Vertigo, benign positional     Past Surgical History:  Procedure Laterality Date   AV NODE ABLATION N/A 03/30/2020   Procedure: AV NODE ABLATION;  Surgeon: Deboraha Sprang, MD;  Location: [facility];  Service: Cardiovascular;  Laterality: N/A;   AV NODE ABLATION N/A 03/30/2020   Procedure: AV NODE ABLATION;  Surgeon: Deboraha Sprang, MD;  Location: [facility];  Service: Cardiovascular;  Laterality: N/A;   CARDIOVERSION Left 10/31/2018   Procedure: CARDIOVERSION;  Surgeon: Acie Fredrickson Wonda Cheng, MD;  Location: [facility];  Service: Cardiovascular;  Laterality: Left;   CARDIOVERSION N/A 01/08/2020   Procedure: CARDIOVERSION;  Surgeon: [name], MD;  Location: [facility] ENDOSCOPY;  Service: Cardiovascular;  Laterality: N/A;   Hemilaminectomy and microdiskectomy at L4-5 on the left.  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   Procedure: PACEMAKER IMPLANT;  Surgeon: Deboraha Sprang, MD;   Location: [facility];  Service: Cardiovascular;  Laterality: N/A;   TEE WITHOUT CARDIOVERSION N/A 06/05/2018   Procedure: TRANSESOPHAGEAL ECHOCARDIOGRAM (TEE);  Surgeon: Lelon Perla, MD;  Location: [facility] ENDOSCOPY;  Service: Cardiovascular;  Laterality: N/A;  loop    There were no vitals filed for this visit.   Subjective Assessment - 11/20/20 1106     Subjective No changes since she was last here.    Patient is accompained by: Family member    Pertinent History CAD, hx of CVA, PAF s/p DCCV, BPPV, a-fib, HLD    Patient Stated Goals Pt would like to be able to live normally with moving and walking easier.    Currently in Pain? No/denies                               [facility] Adult PT Treatment/Exercise - 11/20/20 1106       Ambulation/Gait   Ambulation/Gait Yes    Ambulation/Gait Assistance 5: Supervision    Ambulation/Gait Assistance Details with no AD - cues for relaxed arm swing, pt reporting 6/10 dizziness afterwards and needing a seated rest break. remainder of gait performed throughout session with rollator    Ambulation Distance (Feet) 230 Feet    Assistive device None    Gait Pattern Step-through pattern;Decreased step length - right;Decreased step length - left  Ambulation Surface Level;Indoor      Knee/Hip Exercises: Aerobic   Nustep NuStep at gear 1.0 for 6 minutes with BUE/BLE and keeping spm between 25-35 throughout. pt conversing with therapist throughout with no issues                 Balance Exercises - 11/20/20 1126       Balance Exercises: Standing   Stepping Strategy Anterior;Posterior;Foam/compliant surface;10 reps    Stepping Strategy Limitations on blue air ex, cues for weight shift and foot clearance, incr difficulty with stepping posteriorly, x10 reps alternating each direction, needed seated rest break between each    Rockerboard --    Marching Forwards;Retro;Dynamic;Limitations    Marching Limitations next to  countertop on blue mat- forward/retro marching with cues for slowed and controlled for incr SLS time down and back x3 reps    Other Standing Exercises with light fingertip support on countertop: down and back x2 reps gait with head motions with cues to incr step length                 PT Short Term Goals - 10/08/20 1211       PT SHORT TERM GOAL #1   Title ALL STS = LTGS               PT Long Term Goals - 11/06/20 1256       PT LONG TERM GOAL #1   Title Pt will be independent with progressive HEP for strength, balance and functional mobility to continue gains on own. ALL LTGS DUE 11/05/20    Baseline continue to progress & will benefit from ongoing additions/revisions.    Time 4    Period Weeks    Status On-going      PT LONG TERM GOAL #2   Title Pt will increase gait speed to >1.0 m/s without AD for improved gait safety.    Baseline 12.97 seconds = .77 m/s; 0.92 m/s without AD    Time 4    Period Weeks    Status Revised      PT LONG TERM GOAL #3   Title Pt will ambulate > 900' with LRAD during 6MWT to demo improvements in endurance    Baseline 613 ft w/ Rollator    Time 4    Period Weeks    Status New      PT LONG TERM GOAL #4   Title Pt will increase FGA from 18 to >22/30 for improved balance and gait safety.    Baseline 18/30 on 10/06/20; 20/30    Time 4    Period Weeks    Status Revised      PT LONG TERM GOAL #5   Title Pt will perform all items on the MSQ and rate as a 0/5 in order to demo decr motional sensitivity during gait and transfers.    Baseline still have moderate dizziness (3/5) with some activities    Time 4    Period Weeks    Status On-going                   Plan - 11/20/20 1216     Clinical Impression Statement Pt able to incr consecutive time on NuStep today to 6 minutes at gear 1.0 with pt able to converse with therapist throughout with no SOB noted. Remainder of session focused on gait with no AD and balance strategies on  compliant surfaces and with head motions. Pt needing intermittent rest breaks. Will  continue to progress towards LTGs.    Personal Factors and Comorbidities Age;Comorbidity 3+    Comorbidities CAD, hx of CVA, PAF s/p DCCV, BPPV, a-fib, HLD, seizure    Examination-Activity Limitations Bend;Bathing;Carry;Stand;Squat;Stairs;Toileting;Dressing;Transfers;Locomotion Level    Examination-Participation Restrictions Church;Yard Work;Driving;Community Activity;Laundry;Meal Prep;Cleaning    Stability/Clinical Decision Making Evolving/Moderate complexity    Rehab Potential Good    PT Frequency 2x / week    PT Duration 4 weeks    PT Treatment/Interventions ADLs/Self Care Home Management;Aquatic Therapy;Biofeedback;Canalith Repostioning;Balance training;Therapeutic exercise;Manual techniques;Vestibular;Therapeutic activities;Functional mobility training;Stair training;Gait training;Patient/family education;DME Instruction;Neuromuscular re-education    PT Next Visit Plan Continue to progress walking program. NuStep for endurance - progress time and eventually give for pt to perform at home. Has pacemaker set at 68. Continue gait w/o AD working on increasing step length. balance with vision removed, retro gait and head turns/nods.    PT Home Exercise Plan [id number], plus seated VOR x1.    Consulted and Agree with Plan of Care Patient;Family member/caregiver    Family Member Consulted pt's husband: [name]             Patient will benefit from skilled therapeutic intervention in order to improve the following deficits and impairments:  Abnormal gait, Decreased endurance, Impaired sensation, Cardiopulmonary status limiting activity, Decreased knowledge of use of DME, Decreased activity tolerance, Decreased strength, Decreased balance, Decreased mobility, Dizziness, Impaired flexibility  Visit Diagnosis: Other abnormalities of gait and mobility  Unsteadiness on feet  Muscle weakness (generalized)  Dizziness  and giddiness     Problem List Patient Active Problem List   Diagnosis Date Noted   A-fib ([place]) 07/06/2020   Pacemaker - MDT 07/06/2020   New onset seizure ([place]) 06/28/2020   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   Hemiplegia and hemiparesis following cerebral infarction affecting left non-dominant side ([place]) 02/05/2020   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   CVA (cerebral vascular accident) ([place]) 01/09/2020   Dizziness 11/30/2018   Paroxysmal atrial fibrillation ([place]) 08/13/2018   Prediabetes 06/15/2018   Palpitations 06/05/2018   Essential hypertension 06/05/2018   Hyperlipidemia LDL goal <70 06/05/2018   Acute ischemic right MCA stroke ([place]) 06/05/2018   Stroke (cerebrum) ([place]) - R MCA s/p tPA, embolic, source unknown [id number]   SVT (supraventricular tachycardia) ([place]) 05/28/2015   PVC's (premature ventricular contractions) 05/28/2015   BENIGN POSITIONAL VERTIGO 06/16/2009   ALLERGIC RHINITIS 06/16/2009    Arliss Journey, PT, DPT  11/20/2020, 12:18 PM  [facility] [address] [place], [place], [address] Phone: [phone]   Fax:  [phone]  Name: Mary Mercado MRN: [id number] Date of Birth: 1940/10/29

## 2020-11-25 ENCOUNTER — Other Ambulatory Visit: Payer: Self-pay

## 2020-11-25 ENCOUNTER — Ambulatory Visit: Payer: Medicare Other | Attending: Physical Medicine & Rehabilitation

## 2020-11-25 VITALS — BP 124/85 | HR 80

## 2020-11-25 DIAGNOSIS — R2681 Unsteadiness on feet: Secondary | ICD-10-CM | POA: Diagnosis not present

## 2020-11-25 DIAGNOSIS — R2689 Other abnormalities of gait and mobility: Secondary | ICD-10-CM | POA: Diagnosis not present

## 2020-11-25 DIAGNOSIS — R42 Dizziness and giddiness: Secondary | ICD-10-CM | POA: Insufficient documentation

## 2020-11-25 DIAGNOSIS — M6281 Muscle weakness (generalized): Secondary | ICD-10-CM | POA: Insufficient documentation

## 2020-11-25 NOTE — Therapy (Signed)
[facility] [address] [place], [place], [address] Phone: [phone]   Fax:  [phone]  Physical Therapy Treatment  Patient Details  Name: Mary Mercado MRN: [id number] Date of Birth: [birth year] Referring Provider (PT): [name]   Encounter Date: 11/25/2020   PT End of Session - 11/25/20 1100     Visit Number 26    Number of Visits 30    Date for PT Re-Evaluation 12/04/20    Authorization Type Medicare so 10th visit progress note    Progress Note Due on Visit 30    PT Start Time 1100    PT Stop Time 1143    PT Time Calculation (min) 43 min    Equipment Utilized During Treatment Gait belt    Activity Tolerance Patient tolerated treatment well    Behavior During Therapy [facility] for tasks assessed/performed             Past Medical History:  Diagnosis Date   Allergy    CVA (cerebral vascular accident) ([place]) 05/2018, 01/09/20   Hyperlipidemia LDL goal <70    Hypertension    Palpitations    Vertigo, benign positional     Past Surgical History:  Procedure Laterality Date   AV NODE ABLATION N/A 03/30/2020   Procedure: AV NODE ABLATION;  Surgeon: Deboraha Sprang, MD;  Location: [facility];  Service: Cardiovascular;  Laterality: N/A;   AV NODE ABLATION N/A 03/30/2020   Procedure: AV NODE ABLATION;  Surgeon: Deboraha Sprang, MD;  Location: [facility];  Service: Cardiovascular;  Laterality: N/A;   CARDIOVERSION Left 10/31/2018   Procedure: CARDIOVERSION;  Surgeon: Acie Fredrickson Wonda Cheng, MD;  Location: [facility];  Service: Cardiovascular;  Laterality: Left;   CARDIOVERSION N/A 01/08/2020   Procedure: CARDIOVERSION;  Surgeon: [name], MD;  Location: [facility] ENDOSCOPY;  Service: Cardiovascular;  Laterality: N/A;   Hemilaminectomy and microdiskectomy at L4-5 on the left.  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   Procedure: PACEMAKER IMPLANT;  Surgeon: Deboraha Sprang, MD;   Location: [facility];  Service: Cardiovascular;  Laterality: N/A;   TEE WITHOUT CARDIOVERSION N/A 06/05/2018   Procedure: TRANSESOPHAGEAL ECHOCARDIOGRAM (TEE);  Surgeon: Lelon Perla, MD;  Location: [facility] ENDOSCOPY;  Service: Cardiovascular;  Laterality: N/A;  loop    Vitals:   11/25/20 1106  BP: 124/85  Pulse: 80     Subjective Assessment - 11/25/20 1103     Subjective No new changes/complaints. Patient reports that she felt like she over did it with the NuStep. Has not tried the gym at [employer].    Patient is accompained by: Family member    Pertinent History CAD, hx of CVA, PAF s/p DCCV, BPPV, a-fib, HLD    Patient Stated Goals Pt would like to be able to live normally with moving and walking easier.    Currently in Pain? No/denies                               [facility] Adult PT Treatment/Exercise - 11/25/20 0001       Ambulation/Gait   Ambulation/Gait Yes    Ambulation/Gait Assistance 5: Supervision    Ambulation/Gait Assistance Details continue to use rollator to ambulate into/out of session; then throughout session without AD.    Assistive device None    Gait Pattern Step-through pattern;Decreased step length - right;Decreased step length - left    Ambulation  Surface Level;Indoor      Exercises   Exercises Knee/Hip      Knee/Hip Exercises: Aerobic   Nustep NuStep at gear 1.0 for 3 minutes with BUE/BLE still trying to maintain pace above > 25 steps per minute. Then completed 2 minute rest break, followed by additional 3 minutes . Patient continue conversing with therapist throughout with no issues.                 Balance Exercises - 11/25/20 0001       Balance Exercises: Standing   Standing Eyes Opened Narrow base of support (BOS);Foam/compliant surface;Limitations    Standing Eyes Opened Limitations standing with eyes open and narrow BOS on BOSU completed standing withotu UE support 3 x 30 seocnds; then progressed to addition of  horizontal/vertical head turns x 10 reps.    SLS with Vectors Solid surface;Intermittent upper extremity assist;Limitations    SLS with Vectors Limitations standing on rockerboard A/P, completed alternating toe taps to cones x 10 reps bilat, intermittent UE support. CGA    Rockerboard Anterior/posterior;EO;EC;Limitations    Rockerboard Limitations standing with eyes closed 3 x 30 seconds with eyes closed maintaining board steady. CGA and intermittent UE support    Other Standing Exercises with light support completed step ups to BOSU (rounded side up) x 10 reps with oppposite leg float to promote SLS, completed  alternating BLE. CGA                 PT Short Term Goals - 10/08/20 1211       PT SHORT TERM GOAL #1   Title ALL STS = LTGS               PT Long Term Goals - 11/06/20 1256       PT LONG TERM GOAL #1   Title Pt will be independent with progressive HEP for strength, balance and functional mobility to continue gains on own. ALL LTGS DUE 11/05/20    Baseline continue to progress & will benefit from ongoing additions/revisions.    Time 4    Period Weeks    Status On-going      PT LONG TERM GOAL #2   Title Pt will increase gait speed to >1.0 m/s without AD for improved gait safety.    Baseline 12.97 seconds = .77 m/s; 0.92 m/s without AD    Time 4    Period Weeks    Status Revised      PT LONG TERM GOAL #3   Title Pt will ambulate > 900' with LRAD during 6MWT to demo improvements in endurance    Baseline 613 ft w/ Rollator    Time 4    Period Weeks    Status New      PT LONG TERM GOAL #4   Title Pt will increase FGA from 18 to >22/30 for improved balance and gait safety.    Baseline 18/30 on 10/06/20; 20/30    Time 4    Period Weeks    Status Revised      PT LONG TERM GOAL #5   Title Pt will perform all items on the MSQ and rate as a 0/5 in order to demo decr motional sensitivity during gait and transfers.    Baseline still have moderate dizziness (3/5)  with some activities    Time 4    Period Weeks    Status On-going  Plan - 11/25/20 1115     Clinical Impression Statement Continued use of NuStep for improved activity tolerance. Patient reports fatigue after last session, therefore split time on NuStep with addition of rest break for improved tolerance. Continued balance activities with progression to BOSU today, patient tolerating well. Will continue to progress toward all LTGs.    Personal Factors and Comorbidities Age;Comorbidity 3+    Comorbidities CAD, hx of CVA, PAF s/p DCCV, BPPV, a-fib, HLD, seizure    Examination-Activity Limitations Bend;Bathing;Carry;Stand;Squat;Stairs;Toileting;Dressing;Transfers;Locomotion Level    Examination-Participation Restrictions Church;Yard Work;Driving;Community Activity;Laundry;Meal Prep;Cleaning    Stability/Clinical Decision Making Evolving/Moderate complexity    Rehab Potential Good    PT Frequency 2x / week    PT Duration 4 weeks    PT Treatment/Interventions ADLs/Self Care Home Management;Aquatic Therapy;Biofeedback;Canalith Repostioning;Balance training;Therapeutic exercise;Manual techniques;Vestibular;Therapeutic activities;Functional mobility training;Stair training;Gait training;Patient/family education;DME Instruction;Neuromuscular re-education    PT Next Visit Plan Continue to progress walking program. NuStep for endurance - progress time and eventually give for pt to perform at home. Has pacemaker set at 64. Continue gait w/o AD working on increasing step length. balance with vision removed, retro gait and head turns/nods.    PT Home Exercise Plan [id number], plus seated VOR x1.    Consulted and Agree with Plan of Care Patient;Family member/caregiver    Family Member Consulted pt's husband: [name]             Patient will benefit from skilled therapeutic intervention in order to improve the following deficits and impairments:  Abnormal gait, Decreased endurance,  Impaired sensation, Cardiopulmonary status limiting activity, Decreased knowledge of use of DME, Decreased activity tolerance, Decreased strength, Decreased balance, Decreased mobility, Dizziness, Impaired flexibility  Visit Diagnosis: Other abnormalities of gait and mobility  Unsteadiness on feet  Muscle weakness (generalized)  Dizziness and giddiness     Problem List Patient Active Problem List   Diagnosis Date Noted   A-fib ([place]) 07/06/2020   Pacemaker - MDT 07/06/2020   New onset seizure ([place]) 06/28/2020   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) 01/09/2020   Dizziness 11/30/2018   Paroxysmal atrial fibrillation ([place]) 08/13/2018   Prediabetes 06/15/2018   Palpitations 06/05/2018   Essential hypertension 06/05/2018   Hyperlipidemia LDL goal <70 06/05/2018   Acute ischemic right MCA stroke ([place]) 06/05/2018   Stroke (cerebrum) ([place]) - R MCA s/p tPA, embolic, source unknown [id number]   SVT (supraventricular tachycardia) ([place]) 05/28/2015   PVC's (premature ventricular contractions) 05/28/2015   BENIGN POSITIONAL VERTIGO 06/16/2009   ALLERGIC RHINITIS 06/16/2009    Jones Bales, PT, DPT 11/25/2020, 12:01 PM  [facility] [address] [place], [place], [address] Phone: [phone]   Fax:  [phone]  Name: Mary Mercado MRN: [id number] Date of Birth: 02-Jan-1941

## 2020-11-27 ENCOUNTER — Other Ambulatory Visit: Payer: Self-pay

## 2020-11-27 ENCOUNTER — Ambulatory Visit: Payer: Medicare Other

## 2020-11-27 VITALS — BP 119/83 | HR 79

## 2020-11-27 DIAGNOSIS — R42 Dizziness and giddiness: Secondary | ICD-10-CM | POA: Diagnosis not present

## 2020-11-27 DIAGNOSIS — R2681 Unsteadiness on feet: Secondary | ICD-10-CM

## 2020-11-27 DIAGNOSIS — M6281 Muscle weakness (generalized): Secondary | ICD-10-CM

## 2020-11-27 DIAGNOSIS — R2689 Other abnormalities of gait and mobility: Secondary | ICD-10-CM

## 2020-11-27 NOTE — Therapy (Signed)
[facility] [address] [place], [place], [address] Phone: [phone]   Fax:  [phone]  Physical Therapy Treatment  Patient Details  Name: Mary Mercado MRN: [id number] Date of Birth: 08-Aug-1940 Referring Provider (PT): [name]   Encounter Date: 11/27/2020   PT End of Session - 11/27/20 1149     Visit Number 27    Number of Visits 30    Date for PT Re-Evaluation 12/04/20    Authorization Type Medicare so 10th visit progress note    Progress Note Due on Visit 30    PT Start Time 1147    PT Stop Time 1229    PT Time Calculation (min) 42 min    Equipment Utilized During Treatment Gait belt    Activity Tolerance Patient tolerated treatment well    Behavior During Therapy [facility] for tasks assessed/performed             Past Medical History:  Diagnosis Date   Allergy    CVA (cerebral vascular accident) ([place]) 05/2018, 01/09/20   Hyperlipidemia LDL goal <70    Hypertension    Palpitations    Vertigo, benign positional     Past Surgical History:  Procedure Laterality Date   AV NODE ABLATION N/A 03/30/2020   Procedure: AV NODE ABLATION;  Surgeon: Deboraha Sprang, MD;  Location: [facility];  Service: Cardiovascular;  Laterality: N/A;   AV NODE ABLATION N/A 03/30/2020   Procedure: AV NODE ABLATION;  Surgeon: Deboraha Sprang, MD;  Location: [place] [facility];  Service: Cardiovascular;  Laterality: N/A;   CARDIOVERSION Left 10/31/2018   Procedure: CARDIOVERSION;  Surgeon: Acie Fredrickson Wonda Cheng, MD;  Location: [facility];  Service: Cardiovascular;  Laterality: Left;   CARDIOVERSION N/A 01/08/2020   Procedure: CARDIOVERSION;  Surgeon: [name], MD;  Location: [facility] ENDOSCOPY;  Service: Cardiovascular;  Laterality: N/A;   Hemilaminectomy and microdiskectomy at L4-5 on the left.  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   Procedure: PACEMAKER IMPLANT;  Surgeon: Deboraha Sprang, MD;   Location: [place] [facility];  Service: Cardiovascular;  Laterality: N/A;   TEE WITHOUT CARDIOVERSION N/A 06/05/2018   Procedure: TRANSESOPHAGEAL ECHOCARDIOGRAM (TEE);  Surgeon: Lelon Perla, MD;  Location: [facility] ENDOSCOPY;  Service: Cardiovascular;  Laterality: N/A;  loop    Vitals:   11/27/20 1152  BP: 119/83  Pulse: 79     Subjective Assessment - 11/27/20 1149     Subjective Patient reports no new changes/complaints. Reports felt good after last session.    Patient is accompained by: Family member    Pertinent History CAD, hx of CVA, PAF s/p DCCV, BPPV, a-fib, HLD    Patient Stated Goals Pt would like to be able to live normally with moving and walking easier.    Currently in Pain? No/denies                 [facility] Adult PT Treatment/Exercise - 11/27/20 0001       Ambulation/Gait   Ambulation/Gait Yes    Ambulation/Gait Assistance 5: Supervision    Ambulation/Gait Assistance Details throughout therapy session with AD. Plus completed gait training without rollator x 230 ft, patietn demonstrating improved arm swing and balance but continue to be limited with endurance, PT encouraging continued use of rollator for long distance ambulation.    Ambulation Distance (Feet) 230 Feet   clinic distance   Assistive device None    Gait Pattern Step-through pattern;Decreased step length -  right;Decreased step length - left    Ambulation Surface Level;Indoor      High Level Balance   High Level Balance Activities Negotiating over obstacles;Marching forwards;Tandem walking    High Level Balance Comments completed gait with obstalce course including stepping over hurdles, followd by stepping over cones with SLS toe tap prior to step, and then tandem walking/marhcing forwads over blue mat, completed x 3 laps down and back. CGA with tandem walking on complaint surface.      Exercises   Exercises Knee/Hip      Knee/Hip Exercises: Aerobic   Nustep NuStep at gear 1.0 for 3 minutes with  BUE/BLE still trying to maintain pace above > 25 steps per minute. Then completed 2 minute rest break, followed by additional 3 minutes. Patient continue conversing with therapist throughout completion with no issues. PT encouraing to go for additional two minutes but patient reporting SOB and feeling unable. vitals normal.                 Balance Exercises - 11/27/20 0001       Balance Exercises: Standing   Standing Eyes Opened Narrow base of support (BOS);Head turns;Foam/compliant surface;Limitations    Standing Eyes Opened Limitations standing w/ eyes open and horizotnal/vertical head turns x 10 reps on blue mat on upward incline.    Other Standing Exercises standing on blue mat on incline with staggered stance, completed eyes closed 4 x 15 seconds with alternating stance. increased challenge with vision removed. CGA               PT Education - 11/27/20 1208     Education Details How to use NuStep; Adjustments NuStep    Person(s) Educated Patient;Spouse    Methods Explanation;Handout;Demonstration    Comprehension Verbalized understanding;Returned demonstration              PT Short Term Goals - 10/08/20 1211       PT SHORT TERM GOAL #1   Title ALL STS = LTGS               PT Long Term Goals - 11/06/20 1256       PT LONG TERM GOAL #1   Title Pt will be independent with progressive HEP for strength, balance and functional mobility to continue gains on own. ALL LTGS DUE 11/05/20    Baseline continue to progress & will benefit from ongoing additions/revisions.    Time 4    Period Weeks    Status On-going      PT LONG TERM GOAL #2   Title Pt will increase gait speed to >1.0 m/s without AD for improved gait safety.    Baseline 12.97 seconds = .77 m/s; 0.92 m/s without AD    Time 4    Period Weeks    Status Revised      PT LONG TERM GOAL #3   Title Pt will ambulate > 900' with LRAD during 6MWT to demo improvements in endurance    Baseline 613 ft w/  Rollator    Time 4    Period Weeks    Status New      PT LONG TERM GOAL #4   Title Pt will increase FGA from 18 to >22/30 for improved balance and gait safety.    Baseline 18/30 on 10/06/20; 20/30    Time 4    Period Weeks    Status Revised      PT LONG TERM GOAL #5   Title Pt will  perform all items on the MSQ and rate as a 0/5 in order to demo decr motional sensitivity during gait and transfers.    Baseline still have moderate dizziness (3/5) with some activities    Time 4    Period Weeks    Status On-going                   Plan - 11/27/20 1235     Clinical Impression Statement Patietn tolerated NuStep with rest break well, therefore completed again during session with PT educating on proper settings and how to complete appropraitely in assisted living. Handout provided on settings nad patient to trial over weekend. Rest of session focused on continued high level balance, increased challenge on incline today. Will continue to progress toward all LTGs.    Personal Factors and Comorbidities Age;Comorbidity 3+    Comorbidities CAD, hx of CVA, PAF s/p DCCV, BPPV, a-fib, HLD, seizure    Examination-Activity Limitations Bend;Bathing;Carry;Stand;Squat;Stairs;Toileting;Dressing;Transfers;Locomotion Level    Examination-Participation Restrictions Church;Yard Work;Driving;Community Activity;Laundry;Meal Prep;Cleaning    Stability/Clinical Decision Making Evolving/Moderate complexity    Rehab Potential Good    PT Frequency 2x / week    PT Duration 4 weeks    PT Treatment/Interventions ADLs/Self Care Home Management;Aquatic Therapy;Biofeedback;Canalith Repostioning;Balance training;Therapeutic exercise;Manual techniques;Vestibular;Therapeutic activities;Functional mobility training;Stair training;Gait training;Patient/family education;DME Instruction;Neuromuscular re-education    PT Next Visit Plan Continue to progress walking program. NuStep for endurance - progress time Has pacemaker  set at 80. Continue gait w/o AD working on increasing step length. balance with vision removed, retro gait and head turns/nods.    PT Home Exercise Plan [id number], plus seated VOR x1.    Consulted and Agree with Plan of Care Patient;Family member/caregiver    Family Member Consulted pt's husband: [name]             Patient will benefit from skilled therapeutic intervention in order to improve the following deficits and impairments:  Abnormal gait, Decreased endurance, Impaired sensation, Cardiopulmonary status limiting activity, Decreased knowledge of use of DME, Decreased activity tolerance, Decreased strength, Decreased balance, Decreased mobility, Dizziness, Impaired flexibility  Visit Diagnosis: Other abnormalities of gait and mobility  Unsteadiness on feet  Muscle weakness (generalized)  Dizziness and giddiness     Problem List Patient Active Problem List   Diagnosis Date Noted   A-fib ([place]) 07/06/2020   Pacemaker - MDT 07/06/2020   New onset seizure ([place]) 06/28/2020   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   Hemiplegia and hemiparesis following cerebral infarction affecting left non-dominant side ([place]) 02/05/2020   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   CVA (cerebral vascular accident) ([place]) 01/09/2020   Dizziness 11/30/2018   Paroxysmal atrial fibrillation ([place]) 08/13/2018   Prediabetes 06/15/2018   Palpitations 06/05/2018   Essential hypertension 06/05/2018   Hyperlipidemia LDL goal <70 06/05/2018   Acute ischemic right MCA stroke ([place]) 06/05/2018   Stroke (cerebrum) ([place]) - R MCA s/p tPA, embolic, source unknown [id number]   SVT (supraventricular tachycardia) ([place]) 05/28/2015   PVC's (premature ventricular contractions) 05/28/2015   BENIGN POSITIONAL VERTIGO 06/16/2009   ALLERGIC RHINITIS 06/16/2009    Jones Bales, PT, DPT 11/27/2020, 12:40 PM  [facility] [address] [place], [place], [address] Phone: [phone]   Fax:  [phone]  Name: Mary Mercado MRN: [id number] Date of Birth: 03/01/1941

## 2020-11-27 NOTE — Patient Instructions (Signed)
Complete NuStep  Seat: 7 ArmRests: 10  Completed for 3 minutes on Level 1. Maintain steps per minute above 25. Then rest for 1-2 minutes. Followed by additional 3 minutes.

## 2020-11-30 ENCOUNTER — Other Ambulatory Visit: Payer: Self-pay

## 2020-11-30 ENCOUNTER — Ambulatory Visit: Payer: Medicare Other

## 2020-11-30 VITALS — BP 133/82 | HR 80

## 2020-11-30 DIAGNOSIS — M6281 Muscle weakness (generalized): Secondary | ICD-10-CM | POA: Diagnosis not present

## 2020-11-30 DIAGNOSIS — R2689 Other abnormalities of gait and mobility: Secondary | ICD-10-CM | POA: Diagnosis not present

## 2020-11-30 DIAGNOSIS — R42 Dizziness and giddiness: Secondary | ICD-10-CM | POA: Diagnosis not present

## 2020-11-30 DIAGNOSIS — R2681 Unsteadiness on feet: Secondary | ICD-10-CM

## 2020-11-30 NOTE — Therapy (Signed)
[facility] [address] [place], [place], [address] Phone: [phone]   Fax:  [phone]  Physical Therapy Treatment  Patient Details  Name: Mary Mercado MRN: [id number] Date of Birth: [birth year] Referring Provider (PT): [name]   Encounter Date: 11/30/2020   PT End of Session - 11/30/20 1320     Visit Number 28    Number of Visits 30    Date for PT Re-Evaluation 12/04/20    Authorization Type Medicare so 10th visit progress note    Progress Note Due on Visit 30    PT Start Time 1316    PT Stop Time 1359    PT Time Calculation (min) 43 min    Equipment Utilized During Treatment Gait belt    Activity Tolerance Patient tolerated treatment well    Behavior During Therapy [facility] for tasks assessed/performed             Past Medical History:  Diagnosis Date   Allergy    CVA (cerebral vascular accident) ([place]) 05/2018, 01/09/20   Hyperlipidemia LDL goal <70    Hypertension    Palpitations    Vertigo, benign positional     Past Surgical History:  Procedure Laterality Date   AV NODE ABLATION N/A 03/30/2020   Procedure: AV NODE ABLATION;  Surgeon: Deboraha Sprang, MD;  Location: [place] [facility];  Service: Cardiovascular;  Laterality: N/A;   AV NODE ABLATION N/A 03/30/2020   Procedure: AV NODE ABLATION;  Surgeon: Deboraha Sprang, MD;  Location: [place] [facility];  Service: Cardiovascular;  Laterality: N/A;   CARDIOVERSION Left 10/31/2018   Procedure: CARDIOVERSION;  Surgeon: Acie Fredrickson Wonda Cheng, MD;  Location: [facility];  Service: Cardiovascular;  Laterality: Left;   CARDIOVERSION N/A 01/08/2020   Procedure: CARDIOVERSION;  Surgeon: [name], MD;  Location: [facility] ENDOSCOPY;  Service: Cardiovascular;  Laterality: N/A;   Hemilaminectomy and microdiskectomy at L4-5 on the left.  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   Procedure: PACEMAKER IMPLANT;  Surgeon: Deboraha Sprang, MD;   Location: [place] [facility];  Service: Cardiovascular;  Laterality: N/A;   TEE WITHOUT CARDIOVERSION N/A 06/05/2018   Procedure: TRANSESOPHAGEAL ECHOCARDIOGRAM (TEE);  Surgeon: Lelon Perla, MD;  Location: [facility] ENDOSCOPY;  Service: Cardiovascular;  Laterality: N/A;  loop    Vitals:   11/30/20 1339  BP: 133/82  Pulse: 80     Subjective Assessment - 11/30/20 1318     Subjective Patient reports no new changes. She went and completed the NuStep at the assisted living facility. Stated it went well.    Patient is accompained by: Family member    Pertinent History CAD, hx of CVA, PAF s/p DCCV, BPPV, a-fib, HLD    Patient Stated Goals Pt would like to be able to live normally with moving and walking easier.    Currently in Pain? No/denies                 [facility] Adult PT Treatment/Exercise - 11/30/20 0001       Ambulation/Gait   Ambulation/Gait Yes    Ambulation/Gait Assistance 5: Supervision    Ambulation/Gait Assistance Details with high level  balance (see below)    Ambulation Distance (Feet) --   clinic distance   Assistive device None    Gait Pattern Step-through pattern;Decreased step length - right;Decreased step length - left    Ambulation Surface Level;Indoor      High Level Balance   High Level  Balance Activities Backward walking;Direction changes;Sudden stops    High Level Balance Comments completed gait with high level balance additions include sudden stops, turns/directions changes, and backwards walking x 350 ft. one instance of imbalance with sudden stop but  able to regain without assistance from PT.      Exercises   Exercises Knee/Hip      Knee/Hip Exercises: Aerobic   Nustep NuStep at gear 1.0 for 3 minutes with BUE/BLE, Then followed by 1 minute rest break, followed by additional 3 minutes. Patient continue conversing with therapist throughout completion with no issues.              Balance Exercises - 11/30/20 0001       Balance Exercises:  Standing   SLS with Vectors Foam/compliant surface;Limitations    SLS with Vectors Limitations standing on airex in front of steps: completed alternating toe taps x 10 reps forward to 1st step, then x 10 reps crossover to 1st step. Progressed to second step x 10 reps with intemrittent CGA. Intermittent UE support from rail needed at times.    Rockerboard Anterior/posterior;EO;Head turns;EC;Intermittent UE support;Limitations    Rockerboard Limitations standing on rockerbaord A/P: completed eyes open with vertical/horizontal head turns x 10 reps, followed by EC 3 x 30 seconds, then compelted eyes open and A/P weight shift x 10 reps.    Tandem Gait Forward;Retro;Foam/compliant surface;Limitations    Tandem Gait Limitations x 3 laps forward tandem followed by reto tandem on blue balance beam. intermittent touchA                 PT Short Term Goals - 10/08/20 1211       PT SHORT TERM GOAL #1   Title ALL STS = LTGS               PT Long Term Goals - 11/06/20 1256       PT LONG TERM GOAL #1   Title Pt will be independent with progressive HEP for strength, balance and functional mobility to continue gains on own. ALL LTGS DUE 11/05/20    Baseline continue to progress & will benefit from ongoing additions/revisions.    Time 4    Period Weeks    Status On-going      PT LONG TERM GOAL #2   Title Pt will increase gait speed to >1.0 m/s without AD for improved gait safety.    Baseline 12.97 seconds = .77 m/s; 0.92 m/s without AD    Time 4    Period Weeks    Status Revised      PT LONG TERM GOAL #3   Title Pt will ambulate > 900' with LRAD during 6MWT to demo improvements in endurance    Baseline 613 ft w/ Rollator    Time 4    Period Weeks    Status New      PT LONG TERM GOAL #4   Title Pt will increase FGA from 18 to >22/30 for improved balance and gait safety.    Baseline 18/30 on 10/06/20; 20/30    Time 4    Period Weeks    Status Revised      PT LONG TERM GOAL #5    Title Pt will perform all items on the MSQ and rate as a 0/5 in order to demo decr motional sensitivity during gait and transfers.    Baseline still have moderate dizziness (3/5) with some activities    Time 4    Period Weeks  Status On-going                   Plan - 11/30/20 1450     Clinical Impression Statement Continued NuStep as patient has officially began to incorporate this into routine at the ALF. Continued session focused on high level balance with static and dyanmic gait. patient demonstrating significant improvements in balance. continue to require intermittent rest breaks due to fatigue. Will continue to progress toward all LTGs.    Personal Factors and Comorbidities Age;Comorbidity 3+    Comorbidities CAD, hx of CVA, PAF s/p DCCV, BPPV, a-fib, HLD, seizure    Examination-Activity Limitations Bend;Bathing;Carry;Stand;Squat;Stairs;Toileting;Dressing;Transfers;Locomotion Level    Examination-Participation Restrictions Church;Yard Work;Driving;Community Activity;Laundry;Meal Prep;Cleaning    Stability/Clinical Decision Making Evolving/Moderate complexity    Rehab Potential Good    PT Frequency 2x / week    PT Duration 4 weeks    PT Treatment/Interventions ADLs/Self Care Home Management;Aquatic Therapy;Biofeedback;Canalith Repostioning;Balance training;Therapeutic exercise;Manual techniques;Vestibular;Therapeutic activities;Functional mobility training;Stair training;Gait training;Patient/family education;DME Instruction;Neuromuscular re-education    PT Next Visit Plan Plan to check goals + D/C    PT Home Exercise Plan [id number], plus seated VOR x1.    Consulted and Agree with Plan of Care Patient;Family member/caregiver    Family Member Consulted pt's husband: [name]             Patient will benefit from skilled therapeutic intervention in order to improve the following deficits and impairments:  Abnormal gait, Decreased endurance, Impaired sensation, Cardiopulmonary  status limiting activity, Decreased knowledge of use of DME, Decreased activity tolerance, Decreased strength, Decreased balance, Decreased mobility, Dizziness, Impaired flexibility  Visit Diagnosis: Other abnormalities of gait and mobility  Unsteadiness on feet  Muscle weakness (generalized)     Problem List Patient Active Problem List   Diagnosis Date Noted   A-fib ([place]) 07/06/2020   Pacemaker - MDT 07/06/2020   New onset seizure ([place]) 06/28/2020   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   Hemiplegia and hemiparesis following cerebral infarction affecting left non-dominant side ([place]) 02/05/2020   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[place]) 01/14/2020   CVA (cerebral vascular accident) ([place]) 01/09/2020   Dizziness 11/30/2018   Paroxysmal atrial fibrillation ([place]) 08/13/2018   Prediabetes 06/15/2018   Palpitations 06/05/2018   Essential hypertension 06/05/2018   Hyperlipidemia LDL goal <70 06/05/2018   Acute ischemic right MCA stroke ([place]) 06/05/2018   Stroke (cerebrum) ([place]) - R MCA s/p tPA, embolic, source unknown [id number]   SVT (supraventricular tachycardia) ([place]) 05/28/2015   PVC's (premature ventricular contractions) 05/28/2015   BENIGN POSITIONAL VERTIGO 06/16/2009   ALLERGIC RHINITIS 06/16/2009    Jones Bales, PT, DPT 11/30/2020, 2:55 PM  [facility] [address] [place], [place], [address] Phone: [phone]   Fax:  [phone]  Name: Mary Mercado MRN: [id number] Date of Birth: May 09, 1940

## 2020-12-02 ENCOUNTER — Ambulatory Visit: Payer: Medicare Other

## 2020-12-02 ENCOUNTER — Other Ambulatory Visit: Payer: Self-pay

## 2020-12-02 ENCOUNTER — Ambulatory Visit: Payer: Medicare Other | Admitting: Adult Health

## 2020-12-02 DIAGNOSIS — R2681 Unsteadiness on feet: Secondary | ICD-10-CM | POA: Diagnosis not present

## 2020-12-02 DIAGNOSIS — R2689 Other abnormalities of gait and mobility: Secondary | ICD-10-CM | POA: Diagnosis not present

## 2020-12-02 DIAGNOSIS — M6281 Muscle weakness (generalized): Secondary | ICD-10-CM

## 2020-12-02 DIAGNOSIS — R42 Dizziness and giddiness: Secondary | ICD-10-CM | POA: Diagnosis not present

## 2020-12-02 NOTE — Therapy (Signed)
[facility] [address] [place], [place], [address] Phone: [phone]   Fax:  [phone]  Physical Therapy Treatment/Discharge Summary  Patient Details  Name: Mary Mercado MRN: [id number] Date of Birth: 04-19-1941 Referring Provider (PT): [name], MD  PHYSICAL THERAPY DISCHARGE SUMMARY  Visits from Start of Care: 29  Current functional level related to goals / functional outcomes: See Clinical Impression Statement   Remaining deficits: Endurance Deficit   Education / Equipment: HEP/Walking Program   Patient agrees to discharge. Patient goals were partially met. Patient is being discharged due to meeting the stated rehab goals.  Encounter Date: 12/02/2020   PT End of Session - 12/02/20 1104     Visit Number 29    Number of Visits 30    Date for PT Re-Evaluation 12/04/20    Authorization Type Medicare so 10th visit progress note    Progress Note Due on Visit 30    PT Start Time 1101    PT Stop Time 1146    PT Time Calculation (min) 45 min    Equipment Utilized During Treatment Gait belt    Activity Tolerance Patient tolerated treatment well    Behavior During Therapy WFL for tasks assessed/performed             Past Medical History:  Diagnosis Date   Allergy    CVA (cerebral vascular accident) ([place]) 05/2018, 01/09/20   Hyperlipidemia LDL goal <70    Hypertension    Palpitations    Vertigo, benign positional     Past Surgical History:  Procedure Laterality Date   AV NODE ABLATION N/A 03/30/2020   Procedure: AV NODE ABLATION;  Surgeon: Deboraha Sprang, MD;  Location: [facility] LAB;  Service: Cardiovascular;  Laterality: N/A;   AV NODE ABLATION N/A 03/30/2020   Procedure: AV NODE ABLATION;  Surgeon: Deboraha Sprang, MD;  Location: [facility];  Service: Cardiovascular;  Laterality: N/A;   CARDIOVERSION Left 10/31/2018   Procedure: CARDIOVERSION;  Surgeon: Acie Fredrickson  Wonda Cheng, MD;  Location: [facility];  Service: Cardiovascular;  Laterality: Left;   CARDIOVERSION N/A 01/08/2020   Procedure: CARDIOVERSION;  Surgeon: [name], MD;  Location: [facility] ENDOSCOPY;  Service: Cardiovascular;  Laterality: N/A;   Hemilaminectomy and microdiskectomy at L4-5 on the left.  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   Procedure: PACEMAKER IMPLANT;  Surgeon: Deboraha Sprang, MD;  Location: [facility] CV LAB;  Service: Cardiovascular;  Laterality: N/A;   TEE WITHOUT CARDIOVERSION N/A 06/05/2018   Procedure: TRANSESOPHAGEAL ECHOCARDIOGRAM (TEE);  Surgeon: Lelon Perla, MD;  Location: [facility] ENDOSCOPY;  Service: Cardiovascular;  Laterality: N/A;  loop    There were no vitals filed for this visit.   Subjective Assessment - 12/02/20 1106     Subjective Patient reports no new changes/complaints. No new changes.    Patient is accompained by: Family member    Pertinent History CAD, hx of CVA, PAF s/p DCCV, BPPV, a-fib, HLD    Patient Stated Goals Pt would like to be able to live normally with moving and walking easier.    Currently in Pain? No/denies                [facility] PT Assessment - 12/02/20 0001       Assessment   Medical Diagnosis gait disturbance, post stroke    Referring Provider (PT) [name], MD      Ambulation/Gait   Ambulation/Gait Yes  Ambulation/Gait Assistance 6: Modified independent (Device/Increase time)    Ambulation/Gait Assistance Details Mod I with Rollator for long distance ambulation, Mod without device for short ditstance    Assistive device 4-wheeled walker;None    Gait Pattern Step-through pattern;Decreased step length - right;Decreased step length - left    Ambulation Surface Level;Indoor      6 Minute Walk- Baseline   6 Minute Walk- Baseline yes    BP (mmHg) 135/96    HR (bpm) 80    Modified Borg Scale for Dyspnea 0- Nothing at all      6 Minute walk- Post Test   6 Minute Walk Post Test yes    BP (mmHg)  154/92    HR (bpm) 80    Modified Borg Scale for Dyspnea 5- Strong or hard breathing    Perceived Rate of Exertion (Borg) 14-      6 minute walk test results    Aerobic Endurance Distance Walked 853    Endurance additional comments completed with Rollator; Mod I, no rest break required      Functional Gait  Assessment   Gait assessed  Yes    Gait Level Surface Walks 20 ft in less than 7 sec but greater than 5.5 sec, uses assistive device, slower speed, mild gait deviations, or deviates 6-10 in outside of the 12 in walkway width.    Change in Gait Speed Able to smoothly change walking speed without loss of balance or gait deviation. Deviate no more than 6 in outside of the 12 in walkway width.    Gait with Horizontal Head Turns Performs head turns smoothly with no change in gait. Deviates no more than 6 in outside 12 in walkway width    Gait with Vertical Head Turns Performs head turns with no change in gait. Deviates no more than 6 in outside 12 in walkway width.    Gait and Pivot Turn Pivot turns safely in greater than 3 sec and stops with no loss of balance, or pivot turns safely within 3 sec and stops with mild imbalance, requires small steps to catch balance.    Step Over Obstacle Is able to step over 2 stacked shoe boxes taped together (9 in total height) without changing gait speed. No evidence of imbalance.    Gait with Narrow Base of Support Is able to ambulate for 10 steps heel to toe with no staggering.    Gait with Eyes Closed Walks 20 ft, uses assistive device, slower speed, mild gait deviations, deviates 6-10 in outside 12 in walkway width. Ambulates 20 ft in less than 9 sec but greater than 7 sec.    Ambulating Backwards Walks 20 ft, uses assistive device, slower speed, mild gait deviations, deviates 6-10 in outside 12 in walkway width.    Steps Alternating feet, must use rail.    Total Score 25    FGA comment: 25/30 = Low Fall Risk                 Vestibular Assessment  - 12/02/20 0001       Positional Sensitivities   Sit to Supine No dizziness    Supine to Left Side No dizziness    Supine to Right Side No dizziness    Supine to Sitting Lightheadedness    Right Hallpike No dizziness    Up from Right Hallpike Lightheadedness    Up from Left Hallpike Lightheadedness    Nose to Right Knee No dizziness    Right Knee  to Sitting No dizziness    Nose to Left Knee No dizziness    Left Knee to Sitting No dizziness    Head Turning x 5 No dizziness    Head Nodding x 5 Mild dizziness    Pivot Right in Standing No dizziness    Pivot Left in Standing No dizziness    Rolling Right No dizziness    Rolling Left No dizziness                      [facility] Adult PT Treatment/Exercise - 12/02/20 0001       Ambulation/Gait   Gait velocity 9.87 secs = 1.02 m/s   without AD                   PT Education - 12/02/20 1108     Education Details progress LTGs; Continue HEP and walking program    Person(s) Educated Patient;Spouse    Methods Explanation    Comprehension Verbalized understanding              PT Short Term Goals - 10/08/20 1211       PT SHORT TERM GOAL #1   Title ALL STS = LTGS               PT Long Term Goals - 12/02/20 1121       PT LONG TERM GOAL #1   Title Pt will be independent with progressive HEP for strength, balance and functional mobility to continue gains on own. ALL LTGS DUE 11/05/20    Baseline continue to progress & will benefit from ongoing additions/revisions; reports independence with HEP    Time 4    Period Weeks    Status Achieved      PT LONG TERM GOAL #2   Title Pt will increase gait speed to >1.0 m/s without AD for improved gait safety.    Baseline 12.97 seconds = .77 m/s; 0.92 m/s without AD;; 1.02 m/s    Time 4    Period Weeks    Status Achieved      PT LONG TERM GOAL #3   Title Pt will ambulate > 900' with LRAD during 6MWT to demo improvements in endurance    Baseline 613 ft w/  Rollator; 853 ft w/ Rollator    Time 4    Period Weeks    Status Partially Met      PT LONG TERM GOAL #4   Title Pt will increase FGA from 18 to >22/30 for improved balance and gait safety.    Baseline 18/30 on 10/06/20; 20/30; 25/30    Time 4    Period Weeks    Status Achieved      PT LONG TERM GOAL #5   Title Pt will perform all items on the MSQ and rate as a 0/5 in order to demo decr motional sensitivity during gait and transfers.    Baseline still have moderate dizziness (3/5) with some activities; still continue to have some dizziness mild dizziness with head nods but reduced intensity    Time 4    Period Weeks    Status Partially Met                   Plan - 12/02/20 1156     Clinical Impression Statement Completed assesment of patient's progress toward LTG. Patient able to partially meet/met all LTGs. Patient demo improved balance and reduced fall risk with score of 25/30. Patient also  demonstrating improved endurance with 6MWT, able to ambulate 853 ft with Rollator. Patient demonstrating significant progress with PT services and demo readiness to d/c. Patient and spouse agreeable. PT educating on continued completion of HEP and walking program daily to maintain gains achieved with PT services.    Personal Factors and Comorbidities Age;Comorbidity 3+    Comorbidities CAD, hx of CVA, PAF s/p DCCV, BPPV, a-fib, HLD, seizure    Examination-Activity Limitations Bend;Bathing;Carry;Stand;Squat;Stairs;Toileting;Dressing;Transfers;Locomotion Level    Examination-Participation Restrictions Church;Yard Work;Driving;Community Activity;Laundry;Meal Prep;Cleaning    Stability/Clinical Decision Making Evolving/Moderate complexity    Rehab Potential Good    PT Frequency 2x / week    PT Duration 4 weeks    PT Treatment/Interventions ADLs/Self Care Home Management;Aquatic Therapy;Biofeedback;Canalith Repostioning;Balance training;Therapeutic exercise;Manual  techniques;Vestibular;Therapeutic activities;Functional mobility training;Stair training;Gait training;Patient/family education;DME Instruction;Neuromuscular re-education    PT Home Exercise Plan [id number], plus seated VOR x1.    Consulted and Agree with Plan of Care Patient;Family member/caregiver    Family Member Consulted pt's husband: [name]             Patient will benefit from skilled therapeutic intervention in order to improve the following deficits and impairments:  Abnormal gait, Decreased endurance, Impaired sensation, Cardiopulmonary status limiting activity, Decreased knowledge of use of DME, Decreased activity tolerance, Decreased strength, Decreased balance, Decreased mobility, Dizziness, Impaired flexibility  Visit Diagnosis: Other abnormalities of gait and mobility  Unsteadiness on feet  Muscle weakness (generalized)  Dizziness and giddiness     Problem List Patient Active Problem List   Diagnosis Date Noted   A-fib ([place]) 07/06/2020   Pacemaker - MDT 07/06/2020   New onset seizure ([place]) 06/28/2020   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   Hemiplegia and hemiparesis following cerebral infarction affecting left non-dominant side ([place]) 02/05/2020   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   CVA (cerebral vascular accident) ([place]) 01/09/2020   Dizziness 11/30/2018   Paroxysmal atrial fibrillation ([place]) 08/13/2018   Prediabetes 06/15/2018   Palpitations 06/05/2018   Essential hypertension 06/05/2018   Hyperlipidemia LDL goal <70 06/05/2018   Acute ischemic right MCA stroke ([place]) 06/05/2018   Stroke (cerebrum) ([place]) - R MCA s/p tPA, embolic, source unknown 57/84/6962   SVT (supraventricular tachycardia) ([place]) 05/28/2015   PVC's (premature ventricular contractions) 05/28/2015   BENIGN POSITIONAL VERTIGO 06/16/2009   ALLERGIC RHINITIS 06/16/2009    Jones Bales, PT, DPT 12/02/2020, 12:03 PM  [facility] [address] [place], [place], [address] Phone: [phone]   Fax:  [phone]  Name: Patric Vanpelt MRN: [id number] Date of Birth: [birth year]

## 2020-12-31 ENCOUNTER — Ambulatory Visit (INDEPENDENT_AMBULATORY_CARE_PROVIDER_SITE_OTHER): Payer: Medicare Other

## 2020-12-31 DIAGNOSIS — I4430 Unspecified atrioventricular block: Secondary | ICD-10-CM | POA: Diagnosis not present

## 2020-12-31 LAB — CUP PACEART REMOTE DEVICE CHECK
Battery Remaining Longevity: 126 mo
Battery Voltage: 3.03 V
Brady Statistic AP VP Percent: 0 %
Brady Statistic AP VS Percent: 0 %
Brady Statistic AS VP Percent: 99.54 %
Brady Statistic AS VS Percent: 0.46 %
Brady Statistic RA Percent Paced: 0 %
Brady Statistic RV Percent Paced: 99.54 %
Date Time Interrogation Session: 20220907215314
Implantable Lead Implant Date: 20211206
Implantable Lead Implant Date: 20211206
Implantable Lead Location: 753858
Implantable Lead Location: 753860
Implantable Lead Model: 3830
Implantable Lead Model: 5076
Implantable Pulse Generator Implant Date: 20211206
Lead Channel Impedance Value: 304 Ohm
Lead Channel Impedance Value: 342 Ohm
Lead Channel Impedance Value: 380 Ohm
Lead Channel Impedance Value: 456 Ohm
Lead Channel Pacing Threshold Amplitude: 1.25 V
Lead Channel Pacing Threshold Pulse Width: 0.4 ms
Lead Channel Sensing Intrinsic Amplitude: 6.5 mV
Lead Channel Sensing Intrinsic Amplitude: 6.5 mV
Lead Channel Sensing Intrinsic Amplitude: 7.5 mV
Lead Channel Sensing Intrinsic Amplitude: 7.5 mV
Lead Channel Setting Pacing Amplitude: 2.5 V
Lead Channel Setting Pacing Pulse Width: 0.4 ms
Lead Channel Setting Sensing Sensitivity: 2 mV

## 2021-01-08 NOTE — Progress Notes (Signed)
Remote pacemaker transmission.   

## 2021-01-12 DIAGNOSIS — Z23 Encounter for immunization: Secondary | ICD-10-CM | POA: Diagnosis not present

## 2021-01-25 ENCOUNTER — Telehealth: Payer: Self-pay | Admitting: Neurology

## 2021-01-25 NOTE — Telephone Encounter (Signed)
FYI- Pt called with new pharmacy [facility], [place] [address] [id number].

## 2021-01-25 NOTE — Telephone Encounter (Signed)
Pt called states the pharmacy only gave her a 1 month supply of the PRADAXA 150 MG CAPS capsule she usually gets a 90 supply. Pt requesting a call back.

## 2021-01-26 ENCOUNTER — Telehealth: Payer: Self-pay | Admitting: Internal Medicine

## 2021-01-26 MED ORDER — PRADAXA 150 MG PO CAPS: 150.0000 mg | ORAL_CAPSULE | Freq: Two times a day (BID) | ORAL | 1 refills | Status: DC

## 2021-01-26 NOTE — Telephone Encounter (Signed)
I spoke with the patient and helped her send a manual transmission. Transmission received. 10/4/202.

## 2021-01-26 NOTE — Addendum Note (Signed)
Addended by: Lester [place] A on: 01/26/2021 08:46 AM   Modules accepted: Orders

## 2021-01-26 NOTE — Telephone Encounter (Signed)
Incoming call from patient. Discussed symptoms of "insides quivering" Patient offered AF clinic but declined. States she feels better when her device is programmed at a rate of 70 instead of 80 bpm. Patient is requesting if change can be made. Of note patient had same complaints 07/13/20 and changes were made at that visit.   07/13/20 per SK note: "Patient was quite symptomatic with the activation of rate responsiveness reprogramming to make him more active.  We have turned off rate response reprogramming her to VVI at 80 with a sleep mode at 70 to try to minimize the rate response of symptoms.  She will let us know later this week how she is feeling"  Will route to Dr. Caryl Comes for recommendations and once change approved will make device clinic appointment.

## 2021-01-26 NOTE — Telephone Encounter (Signed)
I called patient. Pradaxa has been changed to a 90 day supply and sent to [place]. Pt verbalized understanding.

## 2021-01-26 NOTE — Telephone Encounter (Signed)
Spoke with pt who reports she believes she has been in Afib for quite some time and had adjustments made to her pacemaker at her last visit.  She believes her pacemaker needs some adjustment again.Pt reports current HR 80. Pt denies CP, SOB or dizziness. Requested pt send device transmission.  Pt states she does not know how to do this and will need assistance.   Will forward to device clinic for further assistance.  Pt verbalizes understanding and agrees with current plan.

## 2021-01-26 NOTE — Telephone Encounter (Signed)
Pt called in to report that she is in AF and is having continuous whole body trembling.  She feels that she needs device rate reprogrammed and would like to be checked out.  She has not missed any doses of Pradaxa.  Last BP 135/88-80.  Will route to device clinic and RN to review.

## 2021-01-26 NOTE — Telephone Encounter (Signed)
Manual transmission reviewed. Programmed VVI 80. Noted RV/His lead. Patient has chronic AF. V rates controlled with pacing. Unsuccessful telephone call to follow up with patient complaints of symptomatic AF. Discussed with Jens Som, PA. Who suggest patient follow up with AF clinic. [employer] compliant VM message left requesting call back to [phone].

## 2021-01-26 NOTE — Telephone Encounter (Signed)
Patient c/o Palpitations:  High priority if patient c/o lightheadedness, shortness of breath, or chest pain  How long have you had palpitations/irregular HR/ Afib? Are you having the symptoms now? AFIB NOW, THIS HAS BEEN GOING ON FOR SEVERAL MONTHS   Are you currently experiencing lightheadedness, SOB or CP? NO  Do you have a history of afib (atrial fibrillation) or irregular heart rhythm? YES  Have you checked your BP or HR? (document readings if available): NO PTS HR IS SET TO 80   Are you experiencing any other symptoms? PT IS TREMBLING FEELS LIKE PT IS IN AFIB

## 2021-01-27 NOTE — [place] Notes (Signed)
Cardiology Office Note Date:  01/28/2021  Patient ID:  Mary Mercado Jul 10, 1940, MRN [id number] PCP:  [name], MD  Cardiologist:  Dr. Acie Fredrickson Electrophysiologist: Dr. Caryl Comes     Chief Complaint:  feels poorly  History of Present Illness: Mary Mercado is a 80 y.o. female with history of strokes (Oct 2021 on Eliquis >> pradaxa, another may 2022), HTN, HLD, AFib >> AVNode ablation /PPM, seizure.  She comes in today to be seen or dr. Caryl Comes, last seen by him March 2022, he recounts that felt very good following device implantation and AV ablation.  She felt good with reprogrammed from 90--80 but after reprogramming to 80--70 with activation of rate response and the making of her rate response even more aggressive couple weeks later felt like she was in atrial fibrillation most of the time, her device programmed rate response to off, base rate 80 and sleep rate 70 Mentions dizziness with vertigo and nystagmus, suggested she take meclizine that she had been prescribed previously  More recently she saw Dr. Acie Fredrickson, he mentions she was feeling better since rate sesponse was turned off. Dizziness, getting rehab for.  No changes were made.  She called the device clinic yesterday with c/o palpitations for several months feeling tremulous and felt she was in Afib, BP was OK Device remote noted V paced rhythm at 80 (with a HIS/septum 3830 lead in A port and 5076 RV apex leads in RV port) (it seems)   TODAY She is accompanied by her husband. She has a bit of a hard time describing what she feels like, but says is a sense of a tremor inside like she is again very aware of her Afib, a sense of anxiety/tremulousness but on the inside.  Like her heart is shaking.   She has noted that at night when her pacer is set at 70 this is much better and wonders of her HR is programmed too fast.  She has not had any CP, but ever since she has had Afib has an ache to the Left thorax,  is constantly there, a subtle constant ache. Not worse with position or breathing, not new or changing  No near syncope or syncope.no SOB    Device information 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   Past Medical History:  Diagnosis Date   Allergy    CVA (cerebral vascular accident) ([place]) 05/2018, 01/09/20   Hyperlipidemia LDL goal <70    Hypertension    Palpitations    Vertigo, benign positional     Past Surgical History:  Procedure Laterality Date   AV NODE ABLATION N/A 03/30/2020   Procedure: AV NODE ABLATION;  Surgeon: Deboraha Sprang, MD;  Location: [facility];  Service: Cardiovascular;  Laterality: N/A;   AV NODE ABLATION N/A 03/30/2020   Procedure: AV NODE ABLATION;  Surgeon: Deboraha Sprang, MD;  Location: [facility] CV LAB;  Service: Cardiovascular;  Laterality: N/A;   CARDIOVERSION Left 10/31/2018   Procedure: CARDIOVERSION;  Surgeon: Acie Fredrickson Wonda Cheng, MD;  Location: [facility];  Service: Cardiovascular;  Laterality: Left;   CARDIOVERSION N/A 01/08/2020   Procedure: CARDIOVERSION;  Surgeon: [name], MD;  Location: [facility] ENDOSCOPY;  Service: Cardiovascular;  Laterality: N/A;   Hemilaminectomy and microdiskectomy at L4-5 on the left.  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   Procedure: PACEMAKER IMPLANT;  Surgeon: Deboraha Sprang, MD;  Location: [facility] CV LAB;  Service:  Cardiovascular;  Laterality: N/A;   TEE WITHOUT CARDIOVERSION N/A 06/05/2018   Procedure: TRANSESOPHAGEAL ECHOCARDIOGRAM (TEE);  Surgeon: Lelon Perla, MD;  Location: [facility] ENDOSCOPY;  Service: Cardiovascular;  Laterality: N/A;  loop    Current Outpatient Medications  Medication Sig Dispense Refill   atorvastatin (LIPITOR) 80 MG tablet TAKE 1 TABLET (80 MG TOTAL) BY MOUTH DAILY AT 6 PM. 30 tablet 11   levETIRAcetam (KEPPRA) 500 MG tablet Take 1 tablet (500 mg total) by mouth 2 (two) times daily. 60 tablet 5   losartan (COZAAR) 50 MG tablet  Take 1 tablet (50 mg total) by mouth daily. 90 tablet 3   metoprolol tartrate (LOPRESSOR) 25 MG tablet TAKE 0.5 TABLETS BY MOUTH AT BEDTIME. 45 tablet 3   PRADAXA 150 MG CAPS capsule Take 1 capsule (150 mg total) by mouth 2 (two) times daily. 180 capsule 1   No current facility-administered medications for this visit.   Facility-Administered Medications Ordered in Other Visits  Medication Dose Route Frequency Provider Last Rate Last Admin   regadenoson (LEXISCAN) injection SOLN 0.4 mg  0.4 mg Intravenous Once Buford Dresser, MD       technetium tetrofosmin (TC-MYOVIEW) injection 32 millicurie  32 millicurie Intravenous Once PRN Buford Dresser, MD        Allergies:   Duloxetine and Acetaminophen   Social History:  The patient  reports that she has never smoked. She has never used smokeless tobacco. She reports that she does not drink alcohol and does not use drugs.   Family History:  The patient's family history includes Colon cancer in her father; Diabetes in her mother; Heart attack in her mother; Heart attack (age of onset: 84) in her brother.  ROS:  Please see the history of present illness.    All other systems are reviewed and otherwise negative.   PHYSICAL EXAM:  VS:  BP 122/80   Pulse 80   Ht 5\' 5"  (1.651 m)   Wt 122 lb 6.4 oz (55.5 kg)   SpO2 90%   BMI 20.37 kg/m  BMI: Body mass index is 20.37 kg/m. Well nourished, well developed, in no acute distress HEENT: normocephalic, atraumatic Neck: no JVD, carotid bruits or masses Cardiac:  RRR; no significant murmurs, no rubs, or gallops Lungs:  CTA b/l, no wheezing, rhonchi or rales Abd: soft, nontender MS: no deformity, advanced atrophy Ext: trace is any L ankle edema Skin: warm and dry, no rash Neuro:  No gross deficits appreciated Psych: euthymic mood, full affect  PPM site is stable, no tethering or discomfort, pt is very thin, skin is intact, no thinning   EKG:  Done today and reviewed by myself  shows  AF, V paced   Device interrogation done today and reviewed by myself:  Battery and lead measurements are good Septal lead the 3830 lead by CXR is plugged into the RV port (confirmed by implant note on her device and EKG 5067 lead is RV apex and in the RA port Lead measurements are good Programmed base pacing rate to 70bpm No HVR episodes noted No other programming changes are made   06/29/20; TTE IMPRESSIONS   1. Left ventricular ejection fraction, by estimation, is 60 to 65%. The  left ventricle has normal function. The left ventricle has no regional  wall motion abnormalities. Left ventricular diastolic parameters are  indeterminate.   2. Right ventricular systolic function is normal. The right ventricular  size is mildly enlarged. There is normal pulmonary artery systolic  pressure. The  estimated right ventricular systolic pressure is 60.0 mmHg.   3. Left atrial size was severely dilated.   4. Right atrial size was severely dilated.   5. The mitral valve is degenerative. Trivial mitral valve regurgitation.   6. 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   7. The aortic valve is tricuspid. Aortic valve regurgitation is trivial.  No aortic stenosis is present.   8. The inferior vena cava is normal in size with <50% respiratory  variability, suggesting right atrial pressure of 8 mmHg.   Comparison(s): No significant change from prior study.   Recent Labs: 06/28/2020: ALT 22 06/29/2020: Hemoglobin 13.4; Magnesium 1.8; Platelets 195 07/13/2020: BUN 11; Creatinine, Ser 0.71; Potassium 4.5; Sodium 140  06/29/2020: Cholesterol 135; HDL 54; LDL Cholesterol 71; Total CHOL/HDL Ratio 2.5; Triglycerides 50; VLDL 10   CrCl cannot be calculated (Patient's most recent lab result is older than the maximum 21 days allowed.).   Wt Readings from Last 3 Encounters:  01/28/21 122 lb 6.4 oz (55.5 kg)   10/29/20 125 lb (56.7 kg)  10/08/20 124 lb 5 oz (56.4 kg)     Other studies reviewed: Additional studies/records reviewed today include: summarized above  ASSESSMENT AND PLAN:  AFib > permanent S/p AV node ablation CHA2DS2Vasc is 6, on Pradaxa Update labs  PPM Intact function EKG looks good septal pacing Programmed as above  I suspect anxiety may [place] role, though she seemed to be more comfortable with our findings and having a plan in place for follow up Update   HTN No changes   Disposition: F/u with me in a month, sooner if needed  Current medicines are reviewed at length with the patient today.  The patient did not have any concerns regarding medicines.  Venetia Night, PA-C 01/28/2021 11:15 AM     [facility] [address] [place] [address] [phone] (office)  [phone] (fax)

## 2021-01-27 NOTE — Telephone Encounter (Signed)
Pt has been scheduled to see Tommye Standard, PA-C 01/28/2021 d/t Dr Caryl Comes being out of the office for the next 2 weeks.  Pt is aware of appointment.

## 2021-01-28 ENCOUNTER — Ambulatory Visit (INDEPENDENT_AMBULATORY_CARE_PROVIDER_SITE_OTHER): Payer: Medicare Other | Admitting: Physician Assistant

## 2021-01-28 ENCOUNTER — Encounter: Payer: Self-pay | Admitting: Physician Assistant

## 2021-01-28 ENCOUNTER — Other Ambulatory Visit: Payer: Self-pay

## 2021-01-28 VITALS — BP 122/80 | HR 80 | Ht 65.0 in | Wt 122.4 lb

## 2021-01-28 DIAGNOSIS — Z79899 Other long term (current) drug therapy: Secondary | ICD-10-CM

## 2021-01-28 DIAGNOSIS — I639 Cerebral infarction, unspecified: Secondary | ICD-10-CM | POA: Diagnosis not present

## 2021-01-28 DIAGNOSIS — I10 Essential (primary) hypertension: Secondary | ICD-10-CM | POA: Diagnosis not present

## 2021-01-28 DIAGNOSIS — Z950 Presence of cardiac pacemaker: Secondary | ICD-10-CM

## 2021-01-28 DIAGNOSIS — I4821 Permanent atrial fibrillation: Secondary | ICD-10-CM | POA: Diagnosis not present

## 2021-01-28 LAB — CUP PACEART INCLINIC DEVICE CHECK
Battery Remaining Longevity: 126 mo
Battery Voltage: 3.03 V
Brady Statistic AP VP Percent: 0 %
Brady Statistic AP VS Percent: 0 %
Brady Statistic AS VP Percent: 99.59 %
Brady Statistic AS VS Percent: 0.41 %
Brady Statistic RA Percent Paced: 0 %
Brady Statistic RV Percent Paced: 99.59 %
Date Time Interrogation Session: 20221006172345
Implantable Lead Implant Date: 20211206
Implantable Lead Implant Date: 20211206
Implantable Lead Location: 753858
Implantable Lead Location: 753860
Implantable Lead Model: 3830
Implantable Lead Model: 5076
Implantable Pulse Generator Implant Date: 20211206
Lead Channel Impedance Value: 361 Ohm
Lead Channel Impedance Value: 361 Ohm
Lead Channel Impedance Value: 418 Ohm
Lead Channel Impedance Value: 475 Ohm
Lead Channel Pacing Threshold Amplitude: 1.25 V
Lead Channel Pacing Threshold Pulse Width: 0.4 ms
Lead Channel Sensing Intrinsic Amplitude: 7.5 mV
Lead Channel Sensing Intrinsic Amplitude: 7.5 mV
Lead Channel Sensing Intrinsic Amplitude: 8.125 mV
Lead Channel Sensing Intrinsic Amplitude: 8.125 mV
Lead Channel Setting Pacing Amplitude: 2.5 V
Lead Channel Setting Pacing Pulse Width: 0.4 ms
Lead Channel Setting Sensing Sensitivity: 2 mV

## 2021-01-28 LAB — BASIC METABOLIC PANEL
BUN/Creatinine Ratio: 14 (ref 12–28)
BUN: 10 mg/dL (ref 8–27)
CO2: 23 mmol/L (ref 20–29)
Calcium: 9.6 mg/dL (ref 8.7–10.3)
Chloride: 99 mmol/L (ref 96–106)
Creatinine, Ser: 0.69 mg/dL (ref 0.57–1.00)
Glucose: 127 mg/dL — ABNORMAL HIGH (ref 70–99)
Potassium: 4.5 mmol/L (ref 3.5–5.2)
Sodium: 139 mmol/L (ref 134–144)
eGFR: 87 mL/min/{1.73_m2} (ref >59)

## 2021-01-28 LAB — CBC
Hematocrit: 43.4 % (ref 34.0–46.6)
Hemoglobin: 13.9 g/dL (ref 11.1–15.9)
MCH: 29 pg (ref 26.6–33.0)
MCHC: 32 g/dL (ref 31.5–35.7)
MCV: 90 fL (ref 79–97)
Platelets: 254 10*3/uL (ref 150–450)
RBC: 4.8 x10E6/uL (ref 3.77–5.28)
RDW: 12.5 % (ref 11.7–15.4)
WBC: 7.3 10*3/uL (ref 3.4–10.8)

## 2021-01-28 NOTE — Patient Instructions (Signed)
Medication Instructions:   Your physician recommends that you continue on your current medications as directed. Please refer to the Current Medication list given to you today.  *If you need a refill on your cardiac medications before your next appointment, please call your pharmacy*   Lab Work:  BMET AND CBC TODAY   If you have labs (blood work) drawn today and your tests are completely normal, you will receive your results only by: [place] (if you have MyChart) OR A paper copy in the mail If you have any lab test that is abnormal or we need to change your treatment, we will call you to review the results.   Testing/Procedures: NONE ORDERED  TODAY     Follow-Up: At [facility], you and your health needs are our priority.  As part of our continuing mission to provide you with exceptional heart care, we have created designated Provider Care Teams.  These Care Teams include your primary Cardiologist (physician) and Advanced Practice Providers (APPs -  Physician Assistants and Nurse Practitioners) who all work together to provide you with the care you need, when you need it.  We recommend signing up for the patient portal called "MyChart".  Sign up information is provided on this After Visit Summary.  MyChart is used to connect with patients for Virtual Visits (Telemedicine).  Patients are able to view lab/test results, encounter notes, upcoming appointments, etc.  Non-urgent messages can be sent to your provider as well.   To learn more about what you can do with MyChart, go to [email or web address].    Your next appointment:   1 month(s)  The format for your next appointment:   In Person  Provider:   You may see the following Advanced Practice Providers on your designated Care Team:   Tommye Standard, [place]    Other Instructions

## 2021-03-04 ENCOUNTER — Encounter: Payer: Self-pay | Admitting: Family Medicine

## 2021-03-04 ENCOUNTER — Other Ambulatory Visit: Payer: Self-pay

## 2021-03-04 ENCOUNTER — Ambulatory Visit (INDEPENDENT_AMBULATORY_CARE_PROVIDER_SITE_OTHER): Payer: Medicare Other | Admitting: Family Medicine

## 2021-03-04 VITALS — BP 142/82 | HR 70 | Temp 97.9°F | Ht 65.0 in | Wt 121.1 lb

## 2021-03-04 DIAGNOSIS — Z23 Encounter for immunization: Secondary | ICD-10-CM | POA: Diagnosis not present

## 2021-03-04 DIAGNOSIS — I471 Supraventricular tachycardia, unspecified: Secondary | ICD-10-CM

## 2021-03-04 DIAGNOSIS — R569 Unspecified convulsions: Secondary | ICD-10-CM

## 2021-03-04 DIAGNOSIS — I69354 Hemiplegia and hemiparesis following cerebral infarction affecting left non-dominant side: Secondary | ICD-10-CM | POA: Diagnosis not present

## 2021-03-04 DIAGNOSIS — R7303 Prediabetes: Secondary | ICD-10-CM | POA: Diagnosis not present

## 2021-03-04 DIAGNOSIS — H8110 Benign paroxysmal vertigo, unspecified ear: Secondary | ICD-10-CM

## 2021-03-04 DIAGNOSIS — I63511 Cerebral infarction due to unspecified occlusion or stenosis of right middle cerebral artery: Secondary | ICD-10-CM

## 2021-03-04 DIAGNOSIS — E785 Hyperlipidemia, unspecified: Secondary | ICD-10-CM | POA: Diagnosis not present

## 2021-03-04 DIAGNOSIS — I10 Essential (primary) hypertension: Secondary | ICD-10-CM

## 2021-03-04 DIAGNOSIS — I480 Paroxysmal atrial fibrillation: Secondary | ICD-10-CM | POA: Diagnosis not present

## 2021-03-04 LAB — LIPID PANEL
Cholesterol: 162 mg/dL (ref 0–200)
HDL: 71.8 mg/dL (ref >39.00)
LDL Cholesterol: 77 mg/dL (ref 0–99)
NonHDL: 90.33
Total CHOL/HDL Ratio: 2
Triglycerides: 69 mg/dL (ref 0.0–149.0)
VLDL: 13.8 mg/dL (ref 0.0–40.0)

## 2021-03-04 LAB — BASIC METABOLIC PANEL
BUN: 13 mg/dL (ref 6–23)
CO2: 28 mEq/L (ref 19–32)
Calcium: 9.7 mg/dL (ref 8.4–10.5)
Chloride: 102 mEq/L (ref 96–112)
Creatinine, Ser: 0.7 mg/dL (ref 0.40–1.20)
GFR: 80.91 mL/min (ref >60.00)
Glucose, Bld: 116 mg/dL — ABNORMAL HIGH (ref 70–99)
Potassium: 3.9 mEq/L (ref 3.5–5.1)
Sodium: 141 mEq/L (ref 135–145)

## 2021-03-04 LAB — CBC WITH DIFFERENTIAL/PLATELET
Basophils Absolute: 0.1 10*3/uL (ref 0.0–0.1)
Basophils Relative: 0.9 % (ref 0.0–3.0)
Eosinophils Absolute: 0.1 10*3/uL (ref 0.0–0.7)
Eosinophils Relative: 1.1 % (ref 0.0–5.0)
HCT: 43.9 % (ref 36.0–46.0)
Hemoglobin: 14.3 g/dL (ref 12.0–15.0)
Lymphocytes Relative: 17.7 % (ref 12.0–46.0)
Lymphs Abs: 1 10*3/uL (ref 0.7–4.0)
MCHC: 32.6 g/dL (ref 30.0–36.0)
MCV: 89.2 fl (ref 78.0–100.0)
Monocytes Absolute: 0.4 10*3/uL (ref 0.1–1.0)
Monocytes Relative: 7.1 % (ref 3.0–12.0)
Neutro Abs: 4.2 10*3/uL (ref 1.4–7.7)
Neutrophils Relative %: 73.2 % (ref 43.0–77.0)
Platelets: 208 10*3/uL (ref 150.0–400.0)
RBC: 4.92 Mil/uL (ref 3.87–5.11)
RDW: 14 % (ref 11.5–15.5)
WBC: 5.7 10*3/uL (ref 4.0–10.5)

## 2021-03-04 LAB — HEMOGLOBIN A1C: Hgb A1c MFr Bld: 6.5 % (ref 4.6–6.5)

## 2021-03-04 LAB — HEPATIC FUNCTION PANEL
ALT: 17 U/L (ref 0–35)
AST: 21 U/L (ref 0–37)
Albumin: 4.3 g/dL (ref 3.5–5.2)
Alkaline Phosphatase: 40 U/L (ref 39–117)
Bilirubin, Direct: 0.2 mg/dL (ref 0.0–0.3)
Total Bilirubin: 1.3 mg/dL — ABNORMAL HIGH (ref 0.2–1.2)
Total Protein: 7.8 g/dL (ref 6.0–8.3)

## 2021-03-04 LAB — TSH: TSH: 3.43 u[IU]/mL (ref 0.35–5.50)

## 2021-03-04 NOTE — Progress Notes (Signed)
Subjective:    Patient ID: Mary Mercado, female    DOB: 1940/08/08, 80 y.o.   MRN: [id number]  HPI Here to follow up on issues. She is doing well in general. She has stabilized from the stroke, and she uses her rollator walker all the time. She asks if she can attend chair yoga with her husband at her care facility. Her BP has been on the high side at home with readings averaging in the 140s over 90s. Her heart rate in stable in the 70s. No evidence of seizure activity.    Review of Systems  Constitutional: Negative.   HENT: Negative.    Eyes: Negative.   Respiratory: Negative.    Cardiovascular: Negative.   Gastrointestinal: Negative.   Genitourinary:  Negative for decreased urine volume, difficulty urinating, dyspareunia, dysuria, enuresis, flank pain, frequency, hematuria, pelvic pain and urgency.  Musculoskeletal: Negative.   Skin: Negative.   Neurological:  Positive for dizziness and weakness. Negative for headaches.  Psychiatric/Behavioral: Negative.        Objective:   Physical Exam Constitutional:      General: She is not in acute distress.    Appearance: Normal appearance. She is well-developed.  HENT:     Head: Normocephalic and atraumatic.     Right Ear: External ear normal.     Left Ear: External ear normal.     Nose: Nose normal.     Mouth/Throat:     Pharynx: No oropharyngeal exudate.  Eyes:     General: No scleral icterus.    Conjunctiva/sclera: Conjunctivae normal.     Pupils: Pupils are equal, round, and reactive to light.  Neck:     Thyroid: No thyromegaly.     Vascular: No JVD.  Cardiovascular:     Rate and Rhythm: Normal rate and regular rhythm.     Heart sounds: Normal heart sounds. No murmur heard.   No friction rub. No gallop.  Pulmonary:     Effort: Pulmonary effort is normal. No respiratory distress.     Breath sounds: Normal breath sounds. No wheezing or rales.  Chest:     Chest wall: No tenderness.  Abdominal:     General:  Bowel sounds are normal. There is no distension.     Palpations: Abdomen is soft. There is no mass.     Tenderness: There is no abdominal tenderness. There is no guarding or rebound.  Musculoskeletal:        General: No tenderness. Normal range of motion.     Cervical back: Normal range of motion and neck supple.  Lymphadenopathy:     Cervical: No cervical adenopathy.  Skin:    General: Skin is warm and dry.     Findings: No erythema or rash.  Neurological:     Mental Status: She is alert and oriented to person, place, and time.     Cranial Nerves: No cranial nerve deficit.     Motor: No abnormal muscle tone.     Coordination: Coordination normal.     Deep Tendon Reflexes: Reflexes are normal and symmetric. Reflexes normal.  Psychiatric:        Behavior: Behavior normal.        Thought Content: Thought content normal.        Judgment: Judgment normal.          Assessment & Plan:  She is doing well after the stroke. I told her it would be a great idea for her to try chair yoga. Her  HTN and SVT and paroxysmal atrial fib are stable. We will get fasting labs to check her lipids, A1c, etc. Given a flu shot and a pneumonia vaccine. We spent a total of ( 34  ) minutes reviewing records and discussing these issues.  [name], MD

## 2021-03-06 NOTE — Progress Notes (Signed)
Cardiology Office Note Date:  03/06/2021  Patient ID:  [facility] Mary Mercado, Mary Mercado 1940-07-15, MRN [id number] PCP:  [name], MD  Cardiologist:  Dr. Acie Fredrickson Electrophysiologist: Dr. Caryl Comes     Chief Complaint:  planned f/u  History of Present Illness: Mary Mercado is a 80 y.o. female with history of strokes (Oct 2021 on Eliquis >> pradaxa, another may 2022), HTN, HLD, AFib >> AVNode ablation /PPM, seizure.  She comes in today to be seen or dr. Caryl Comes, last seen by him March 2022, he recounts that felt very good following device implantation and AV ablation.  She felt good with reprogrammed from 90--80 but after reprogramming to 80--70 with activation of rate response and the making of her rate response even more aggressive couple weeks later felt like she was in atrial fibrillation most of the time, her device programmed rate response to off, base rate 80 and sleep rate 70 Mentions dizziness with vertigo and nystagmus, suggested she take meclizine that she had been prescribed previously  More recently she saw Dr. Acie Fredrickson, he mentions she was feeling better since rate sesponse was turned off. Dizziness, getting rehab for.  No changes were made.  She called the device clinic yesterday with c/o palpitations for several months feeling tremulous and felt she was in Afib, BP was OK Device remote noted V paced rhythm at 80 (with a HIS/septum 3830 lead in A port and 5076 RV apex leads in RV port) (it seems)   I aw her 01/28/21 She is accompanied by her husband. She has a bit of a hard time describing what she feels like, but says is a sense of a tremor inside like she is again very aware of her Afib, a sense of anxiety/tremulousness but on the inside.  Like her heart is shaking.   She has noted that at night when her pacer is set at 70 this is much better and wonders of her HR is programmed too fast. She has not had any CP, but ever since she has had Afib has an ache to the  Left thorax, is constantly there, a subtle constant ache. Not worse with position or breathing, not new or changing No near syncope or syncope.no SOB  There was initial confusion on her pacing configuration by documented lead positioning on her device and in her record.  [facility] and CXR 2841 (septal position) is in RV port 5076 (RV apex position) is in RA port EKG morphology with the 3830 pacing (as she was programmed)  looked good Pacer was working well and her base pacing rate reduced to 70, no other changes  I suspected anxiety may play a role in her symptoms, though she seemed to be more comfortable with our findings and having a plan in place for follow up  TODAY She comes today with BP readings since her last visit generally 140's/80,s a couple higher and lower Not sure if anything is changed She continues to have times that she feels tremulous, shakey, perhaps mornings/midday, seems towards evening after taking her 6pm and evening pills, things seem to settl a bit for her She denies feeling lightheaded, no near syncope or syncope, but says sometimes she feels "bad", makes body language suggests fatigued, tired,  She can not seem to say exactly what, but in conversation I think maybe fatigued No anginal sounding c/o, she has a chronic suundling very lateral L thorax pain that is a pinching/stabbing perhaps, achy feeling now for a couple years  Device information 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   Past Medical History:  Diagnosis Date   Allergy    CVA (cerebral vascular accident) ([place]) 05/2018, 01/09/20   Hyperlipidemia LDL goal <70    Hypertension    Palpitations    Vertigo, benign positional     Past Surgical History:  Procedure Laterality Date   AV NODE ABLATION N/A 03/30/2020   Procedure: AV NODE ABLATION;  Surgeon: Deboraha Sprang, MD;  Location: [place] [facility];  Service: Cardiovascular;  Laterality: N/A;   AV  NODE ABLATION N/A 03/30/2020   Procedure: AV NODE ABLATION;  Surgeon: Deboraha Sprang, MD;  Location: [place] [facility];  Service: Cardiovascular;  Laterality: N/A;   CARDIOVERSION Left 10/31/2018   Procedure: CARDIOVERSION;  Surgeon: Acie Fredrickson Wonda Cheng, MD;  Location: [place];  Service: Cardiovascular;  Laterality: Left;   CARDIOVERSION N/A 01/08/2020   Procedure: CARDIOVERSION;  Surgeon: [name], MD;  Location: [facility] ENDOSCOPY;  Service: Cardiovascular;  Laterality: N/A;   Hemilaminectomy and microdiskectomy at L4-5 on the left.  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   Procedure: PACEMAKER IMPLANT;  Surgeon: Deboraha Sprang, MD;  Location: [place] [facility];  Service: Cardiovascular;  Laterality: N/A;   TEE WITHOUT CARDIOVERSION N/A 06/05/2018   Procedure: TRANSESOPHAGEAL ECHOCARDIOGRAM (TEE);  Surgeon: Lelon Perla, MD;  Location: [facility] ENDOSCOPY;  Service: Cardiovascular;  Laterality: N/A;  loop    Current Outpatient Medications  Medication Sig Dispense Refill   atorvastatin (LIPITOR) 80 MG tablet TAKE 1 TABLET (80 MG TOTAL) BY MOUTH DAILY AT 6 PM. 30 tablet 11   levETIRAcetam (KEPPRA) 500 MG tablet Take 1 tablet (500 mg total) by mouth 2 (two) times daily. 60 tablet 5   losartan (COZAAR) 50 MG tablet Take 1 tablet (50 mg total) by mouth daily. 90 tablet 3   metoprolol tartrate (LOPRESSOR) 25 MG tablet TAKE 0.5 TABLETS BY MOUTH AT BEDTIME. 45 tablet 3   PRADAXA 150 MG CAPS capsule Take 1 capsule (150 mg total) by mouth 2 (two) times daily. 180 capsule 1   No current facility-administered medications for this visit.   Facility-Administered Medications Ordered in Other Visits  Medication Dose Route Frequency Provider Last Rate Last Admin   regadenoson (LEXISCAN) injection SOLN 0.4 mg  0.4 mg Intravenous Once Buford Dresser, MD       technetium tetrofosmin (TC-MYOVIEW) injection 32 millicurie  32 millicurie Intravenous Once PRN Buford Dresser, MD         Allergies:   Duloxetine and Acetaminophen   Social History:  The patient  reports that she has never smoked. She has never used smokeless tobacco. She reports that she does not drink alcohol and does not use drugs.   Family History:  The patient's family history includes Colon cancer in her father; Diabetes in her mother; Heart attack in her mother; Heart attack (age of onset: 8) in her brother.  ROS:  Please see the history of present illness.    All other systems are reviewed and otherwise negative.   PHYSICAL EXAM:  VS:  There were no vitals taken for this visit. BMI: There is no height or weight on file to calculate BMI. Well nourished, well developed, in no acute distress HEENT: normocephalic, atraumatic Neck: no JVD, carotid bruits or masses Cardiac:  RRR; no significant murmurs, no rubs, or gallops Lungs:  CTA b/l, no wheezing, rhonchi or rales Abd: soft, nontender MS: no deformity,  advanced atrophy Ext: trace is any L ankle edema Skin: warm and dry, no rash Neuro:  No gross deficits appreciated Psych: euthymic mood, full affect  PPM site is stable, no tethering or discomfort, pt is very thin, skin is intact, no thinning   EKG:  not done today   Device interrogation done today and reviewed by myself:  Battery and lead measurements are good   06/29/20; TTE IMPRESSIONS   1. Left ventricular ejection fraction, by estimation, is 60 to 65%. The  left ventricle has normal function. The left ventricle has no regional  wall motion abnormalities. Left ventricular diastolic parameters are  indeterminate.   2. Right ventricular systolic function is normal. The right ventricular  size is mildly enlarged. There is normal pulmonary artery systolic  pressure. The estimated right ventricular systolic pressure is 30.1 mmHg.   3. Left atrial size was severely dilated.   4. Right atrial size was severely dilated.   5. The mitral valve is degenerative. Trivial mitral valve  regurgitation.   6. 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   7. The aortic valve is tricuspid. Aortic valve regurgitation is trivial.  No aortic stenosis is present.   8. The inferior vena cava is normal in size with <50% respiratory  variability, suggesting right atrial pressure of 8 mmHg.   Comparison(s): No significant change from prior study.   Recent Labs: 06/29/2020: Magnesium 1.8 03/04/2021: ALT 17; BUN 13; Creatinine, Ser 0.70; Hemoglobin 14.3; Platelets 208.0; Potassium 3.9; Sodium 141; TSH 3.43  03/04/2021: Cholesterol 162; HDL 71.80; LDL Cholesterol 77; Total CHOL/HDL Ratio 2; Triglycerides 69.0; VLDL 13.8   Estimated Creatinine Clearance: 47.8 mL/min (by C-G formula based on SCr of 0.7 mg/dL).   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 CHA2DS2Vasc is 6, on Pradaxa, appropriately dosed    PPM 3830 (septal position) is in RV port 5076 (RV apex position) is in the RA port Intact function No programming changes made   HTN By myself Manual check is 148/80 Her cuff 144/96 Her cuff is probably close enough to use I do not think we need to adjust her medicines Seems she feels some measure of improvement after taking the losartan, discussed perhaps taking the losartan AM and lopressor evening  She sees her neurologist next week   Disposition: remotes as usual, in clinic in 68mo, sooner if needed  Current medicines are reviewed at length with the patient today.  The patient did not have any concerns regarding medicines.  Venetia Night, PA-C 03/06/2021 9:35 AM     [facility] [address] [place] [address] [phone] (office)  [phone] (fax)

## 2021-03-08 ENCOUNTER — Encounter: Payer: Self-pay | Admitting: Physician Assistant

## 2021-03-08 ENCOUNTER — Ambulatory Visit (INDEPENDENT_AMBULATORY_CARE_PROVIDER_SITE_OTHER): Payer: Medicare Other | Admitting: Physician Assistant

## 2021-03-08 ENCOUNTER — Other Ambulatory Visit: Payer: Self-pay

## 2021-03-08 DIAGNOSIS — I4821 Permanent atrial fibrillation: Secondary | ICD-10-CM | POA: Diagnosis not present

## 2021-03-08 DIAGNOSIS — I480 Paroxysmal atrial fibrillation: Secondary | ICD-10-CM

## 2021-03-08 DIAGNOSIS — I63511 Cerebral infarction due to unspecified occlusion or stenosis of right middle cerebral artery: Secondary | ICD-10-CM

## 2021-03-08 DIAGNOSIS — Z950 Presence of cardiac pacemaker: Secondary | ICD-10-CM

## 2021-03-08 DIAGNOSIS — I493 Ventricular premature depolarization: Secondary | ICD-10-CM

## 2021-03-08 DIAGNOSIS — I471 Supraventricular tachycardia: Secondary | ICD-10-CM

## 2021-03-08 DIAGNOSIS — I10 Essential (primary) hypertension: Secondary | ICD-10-CM | POA: Diagnosis not present

## 2021-03-08 LAB — CUP PACEART INCLINIC DEVICE CHECK
Battery Remaining Longevity: 126 mo
Battery Voltage: 3.02 V
Brady Statistic AP VP Percent: 0 %
Brady Statistic AP VS Percent: 0 %
Brady Statistic AS VP Percent: 99.73 %
Brady Statistic AS VS Percent: 0.27 %
Brady Statistic RA Percent Paced: 1.8 %
Brady Statistic RV Percent Paced: 99.73 %
Date Time Interrogation Session: 20221114142413
Implantable Lead Implant Date: 20211206
Implantable Lead Implant Date: 20211206
Implantable Lead Location: 753858
Implantable Lead Location: 753860
Implantable Lead Model: 3830
Implantable Lead Model: 5076
Implantable Pulse Generator Implant Date: 20211206
Lead Channel Impedance Value: 361 Ohm
Lead Channel Impedance Value: 380 Ohm
Lead Channel Impedance Value: 418 Ohm
Lead Channel Impedance Value: 494 Ohm
Lead Channel Pacing Threshold Amplitude: 0.75 V
Lead Channel Pacing Threshold Amplitude: 1 V
Lead Channel Pacing Threshold Amplitude: 1 V
Lead Channel Pacing Threshold Pulse Width: 0.4 ms
Lead Channel Pacing Threshold Pulse Width: 0.4 ms
Lead Channel Pacing Threshold Pulse Width: 0.4 ms
Lead Channel Sensing Intrinsic Amplitude: 6 mV
Lead Channel Sensing Intrinsic Amplitude: 6 mV
Lead Channel Sensing Intrinsic Amplitude: 7.5 mV
Lead Channel Sensing Intrinsic Amplitude: 7.5 mV
Lead Channel Setting Pacing Amplitude: 2.5 V
Lead Channel Setting Pacing Pulse Width: 0.4 ms
Lead Channel Setting Sensing Sensitivity: 2 mV

## 2021-03-08 NOTE — Patient Instructions (Signed)
Medication Instructions:   Your physician recommends that you continue on your current medications as directed. Please refer to the Current Medication list given to you today.   *If you need a refill on your cardiac medications before your next appointment, please call your pharmacy*   Lab Work: [place]   If you have labs (blood work) drawn today and your tests are completely normal, you will receive your results only by: [place] (if you have MyChart) OR A paper copy in the mail If you have any lab test that is abnormal or we need to change your treatment, we will call you to review the results.   Testing/Procedures: NONE ORDERED  TODAY    Follow-Up: At [facility], you and your health needs are our priority.  As part of our continuing mission to provide you with exceptional heart care, we have created designated Provider Care Teams.  These Care Teams include your primary Cardiologist (physician) and Advanced Practice Providers (APPs -  Physician Assistants and Nurse Practitioners) who all work together to provide you with the care you need, when you need it.  We recommend signing up for the patient portal called "MyChart".  Sign up information is provided on this After Visit Summary.  MyChart is used to connect with patients for Virtual Visits (Telemedicine).  Patients are able to view lab/test results, encounter notes, upcoming appointments, etc.  Non-urgent messages can be sent to your provider as well.   To learn more about what you can do with MyChart, go to [email or web address].    Your next appointment:   6 month(s)  The format for your next appointment:   In Person  Provider:   You may see Dr. Caryl Comes or one of the following Advanced Practice Providers on your designated Care Team:   Tommye Standard, [place] Legrand Como "Jonni Sanger" Chalmers Cater, [place]    Other Instructions

## 2021-03-09 NOTE — Progress Notes (Signed)
[facility] [address] [place]. [place] [address] [phone]       OFFICE FOLLOW-UP NOTE  [name] Date of Birth:  Sep 12, 1940 Medical Record Number:  [id number]   Reason for visit: Stroke follow-up  Chief complaint: Chief Complaint  Patient presents with   Follow-up    RM 3 with spouse bobby  No seizures since last visit, no new stroke symptoms       HPI:   Update 03/10/2021 JM: Returns for 90-month stroke and seizure follow-up accompanied by her husband  Stable since prior visit -denies new stroke/TIA symptoms or seizure activity Reports continued imbalance and left hand and foot occasional numbness which has been present since her prior strokes.  Continues to ambulate with RW -denies any recent falls. She does report episodes typically occurring midday and evening consisting of feeling shaky/trembling, and just feeling generally "bad" and will improve after sitting for a minute.  No other associated symptoms such as increased weakness, altered awareness/confusion, loss of consciousness, headache or speech changes.  At first, she reported this has been persistent since her stroke but then mentioned it has become more frequent since her seizure. She does believe it usually occurs with increased fatigue or anxiety. This was previously discussed at prior visit almost 2 years ago (see OV 04/2019).  She becomes very anxious with increased L>R UE tremor while discussing this.   Compliant on Keppra 500 mg twice daily  Compliant on Pradaxa and atorvastatin 80 mg daily -denies side effects Blood pressure today 151/84 - occasionally monitors at home - has been running 140s/90s - cardiology is aware  Continues to follow with Dr. Rexene Alberts for sleep apnea monitoring and management  No further concerns at this time      History provided for reference purposes only Update 09/07/2020 Dr. Leonie Man; Patient is seen today accompanied by her husband  following recent hospital admission for seizures in March 2022.  She is seen today for a new problem of seizures.  Patient developed sudden onset of speech difficulties on 06/28/2020 with left upper extremity twitching's.  During transport by EMS she also complained of right-sided headache.  Upon arrival in the ER her twitchings resolved after treatment with IV lorazepam except for mild left thumb twitching.  She was loaded with IV Keppra and the involuntary movements stopped.  EEG showed focal right-sided slowing and MRI scan was negative for acute infarct LDL cholesterol was 71 mg percent and home statin dose was continued.  Hemoglobin A1c was 6.4.  Patient was on Pradaxa 150 mg twice daily which was continued.  Patient states she is tolerating Keppra well without any dizziness or other side effects.  She has had no recurrent seizures or twitching's.  She has had no recurrent stroke or TIA symptoms either.  She is tolerating Pradaxa well without bruising or bleeding.  She is seeing Dr. Rexene Alberts for her sleep apnea and has been using her CPAP every night.  Blood pressure is normally well controlled at home the today it is elevated in office at 150/86.  She has no new complaints.  She has no prior history of seizures, significant head injury with loss of consciousness.  But she does have history of multiple strokes in the past2/2020 admitted for left-sided numbness weakness, status post TPA, MRI showed right frontal parietal infarct.  CTA head neck showed right P2, left VA, bilateral ICA siphon severe stenosis.  EF 60 to 65%.  Negative for DVT, TEE negative.  LDL 117,  A1c 5.9.  Discharged with DAPT and Lipitor 40 30-day CardioNet monitoring showed A. fib patient started on Eliquis. 12/2019  Admitted for right cerebellar infarct.  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  EF 60 to 65%, LDL 81, A1c 5.9.  Eliquis changed to Pradaxa, continue Lipitor 80.   Update  06/03/2020 JM: [name] returns for 55-month stroke follow-up.  She has been stable from a stroke standpoint since prior visit without new stroke/TIA symptoms and reports residual gait unsteadiness with imbalance and dysarthria. She continues to use rolling walker and denies any recent falls. She also reports chronic left leg and torso numbness with occasional burning sensation which is chronic and denies worsening. She has remained on Pradaxa and atorvastatin 80 mg daily without side effects.  Blood pressure today 147/86.  She has routinely followed with cardiology for continued dizziness which she reports is in relation to blood pressure and cardiology currently working on adjustments.  She was evaluated by Dr. Rexene Alberts and was found to have severe sleep apnea. Recommended titration study but patient requested holding off until further recovery from undergoing ablation for persistent A. 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 She is not ready to proceed with titration study. No further concerns at this time.  Initial follow-up 02/03/2020 JM: She was discharged home from [facility] on 01/22/2020 after an 8-day stay.  Since discharge, she reports residual " whooshy head" sensation, gait unsteadiness and mild dysarthria but reports ongoing improvement.  Recommended outpatient PT/OT/SLP at discharge from CIR but referral was not placed at discharge.  She has continued to do exercises at home as recommended during CIR stay.  Continues to use Rollator walker for ambulation and denies any recent falls.  Denies vertigo or dizziness sensation.  Denies new or worsening stroke/TIA symptoms.  Prior stroke deficit of left-sided paresthesias greatly improving and only experience occasional numbness/tingling.  Remains on Pradaxa without bleeding or bruising.  Remains on atorvastatin 80 mg daily without myalgias.  Blood pressure today 122/76.  Monitors at home which has been stable.  She does report urinary frequency over the  past week and is concerned for possible UTI but has been increasing water intake and drinking cranberry juice.  Denies fever, dysuria or hematuria.  Typically, nocturia only on occasion but does report snoring, daytime naps and occasional insomnia.  She has not previously underwent sleep study.  No further concerns at this time.  Stroke admission 01/09/2020 Presented to ER with slurred speech, facial droop vertigo and ataxia while at her cardiologist office the morning of 01/09/2020.  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  Switched anticoagulation to Pradaxa.  HTN stable.  LDL 81 and increase atorvastatin from 40 mg to 80 mg daily.  Other stroke risk factors include advanced age and history of right MCA stroke in 05/2018.  Evaluated by therapy with residual dysarthria, mild memory deficits, vestibular symptoms and imbalance affecting ADLs and mobility therefore discharged to CIR with functional decline.  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 Old infarct R frontoparietal cortex. Small vessel disease. ASPECTS 10.    CTA head & neck no LVO. B ICA bifurcation atherosclerosis, supraclinoid B ICA atherosclerosis. L VA origin moderate to severe stenosis. R P2 moderate stenosis.  MRI  R superior cerebellar territory infarct. Evolution of prior R MCA infarcts from  last year. Small vessel disease.  2D Echo EF 60-65%. No source of embolus. LA severely dilated. LDL 81 HgbA1c 5.9 VTE prophylaxis - Lovenox 40 mg sq daily  Eliquis (apixaban) daily prior to admission, now on aspirin 81 mg daily.   Switched to Pradaxa Therapy recommendations:   CIR Disposition:   CIR     Update 08/01/2019 JM: [name] is a 80 year old female who is being seen today, 08/01/2019, for stroke follow-up with residual left-sided paresthesias and post stroke anxiety accompanied by her husband.   Initiated Cymbalta at prior visit due to ongoing paresthesias and severe anxiety but unfortunately caused hypertension therefore advised to discontinue. Paresthesias have been stable from prior visit without worsening with intermittent symptoms that occur randomly.  She does continue to have occasional balance difficulties and if ambulating or standing for too long she will start to feel "off".  Referral placed at prior visit to PT but apparently has not been called to schedule initial evaluation.  She continues to experience anxiety which has only been present for stroke but does feel slight improvement since prior visit.  Remains on Eliquis and atorvastatin without side effects.  Blood pressure today 132/74.  No further concerns at this time.  Update 05/02/2019: [name] is a 80 year old female who is being seen today for stroke follow-up accompanied by her husband.  Residual deficits left-sided paresthesias consisting of numbness/tingling and occasional burning sensation.  She also endorses episodes of sensation of full body "intermittently shaking" and is usually worsened by stress, anxiety or fatigue.  Usually accompanied by nausea but denies headache or dizziness.  She becomes fearful during those times that she may be having a heart attack.  When questioned regarding anxiety, she does endorse minimal anxiety but after doing GAD-7 test, score of 20/21 showing severe anxiety.  She denies any prior history or family history of depression or anxiety.  She continues on Eliquis without bleeding or bruising.  She continues on atorvastatin without myalgias.  Blood pressure today 140/81.  She denies any additional episodes of dizziness and continues to follow with cardiology for blood pressure management.  She has recently decreased telmisartan as it is recommended to continue blood pressure between 130-150 due to vertebral artery stenosis.  No further concerns at this time.   Update 11/19/2018 Dr. Leonie Man: She is  seen today for office follow-up visit following initial video follow-up visit on 08/13/2018.  She is accompanied by her husband.  She states she continues to have left leg paresthesias and numbness.  She has some mild gait and balance difficulties.  At times she noticed that the legs are trembling and she is initiating walk and she has to hold on something and the feeling goes away.  She has also been started on several new cardiac medications per her ablation for A. fib and feels medication may be the cause for her dizziness.  She is tolerating Eliquis well without bruising or bleeding.  Blood pressures well controlled and today it is 136/79.  She remains on Lipitor which is tolerating well without muscle aches and pains.  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  Initial video visit 08/13/2018 Dr. Leonie Man : This is a initial video virtual consultation visit on [name] who was admitted to [facility] in February 2020 with a stroke.  I have personally obtained history of presenting illness from the patient and her husband and reviewed  electronic medical records as well as imaging films in PACS.  She was admitted on 05/31/2018 with sudden onset of left arm and leg weakness and numbness as well as feeling dizzy and had one episode of emesis.  She had CT scan and CT angiogram in the ER which showed no large vessel occlusion she was given IV TPA and admitted to the neurological intensive care unit.  NIH stroke scale on admission was 4.  At baseline modified Rankin score was 0.  Patient had tight blood pressure control.  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  Transthoracic echo showed normal ejection fraction.  Transesophageal echocardiogram showed no cardiac source of embolism or PFO.  LDL cholesterol was elevated at 1 1 7  mg percent.  Hemoglobin A1c was 5.9.   Urine drug screen was negative.  Lower extremity venous Dopplers were negative for DVT.  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  Patient was started on aspirin 81 and Plavix 75 mg daily for 3 weeks and underwent an outpatient 30-day heart monitor which subsequently showed paroxysmal atrial fibrillation.  She has since then stopped aspirin and Plavix and has been switched to Eliquis.  Patient states she has done well since discharge.  She was initially transferred to inpatient rehab where she stayed for a few weeks and did well.  She is still has left-sided numbness but feels some of the sensation may be coming back now.  She can walk independently without assistance though her husband stays close by.  She has had no falls or injuries.  She is tolerating Eliquis well without bleeding or bruising.  She is also tolerating Lipitor well.  She complains of little bit of jitteriness and tiredness but she blames this likely on her new A. fib medication.  She has no new complaints.  She has no prior history of strokes TIAs.        ROS:   14 system review of systems is positive for those listed in HPI and all other systems negative  PMH:  Past Medical History:  Diagnosis Date   Allergy    CVA (cerebral vascular accident) ([place]) 05/2018, 01/09/20   Hyperlipidemia LDL goal <70    Hypertension    Palpitations    Vertigo, benign positional     Social History:  Social History   Socioeconomic History   Marital status: Married    Spouse name: Engineer, petroleum   Number of children: Not on file   Years of education: 12+   Highest education level: Not on file  Occupational History    Comment: retired  Tobacco Use   Smoking status: Never   Smokeless tobacco: Never  Vaping Use   Vaping Use: Never used   Substance and Sexual Activity   Alcohol use: Never   Drug use: Never   Sexual activity: Not on file  Other Topics Concern   Not on file  Social History Narrative   Lives with husband   caffeine none daily   Right Handed   Social Determinants of Health   Financial Resource Strain: Not on file  Food Insecurity: Not on file  Transportation Needs: Not on file  Physical Activity: Not on file  Stress: Not on file  Social Connections: Not on file  Intimate Partner Violence:  Not on file    Medications:   Current Outpatient Medications on File Prior to Visit  Medication Sig Dispense Refill   atorvastatin (LIPITOR) 80 MG tablet TAKE 1 TABLET (80 MG TOTAL) BY MOUTH DAILY AT 6 PM. 30 tablet 11   losartan (COZAAR) 50 MG tablet Take 1 tablet (50 mg total) by mouth daily. 90 tablet 3   metoprolol tartrate (LOPRESSOR) 25 MG tablet TAKE 0.5 TABLETS BY MOUTH AT BEDTIME. 45 tablet 3   Current Facility-Administered Medications on File Prior to Visit  Medication Dose Route Frequency Provider Last Rate Last Admin   regadenoson (LEXISCAN) injection SOLN 0.4 mg  0.4 mg Intravenous Once Buford Dresser, MD       technetium tetrofosmin (TC-MYOVIEW) injection 32 millicurie  32 millicurie Intravenous Once PRN Buford Dresser, MD        Allergies:   Allergies  Allergen Reactions   Duloxetine     Causes Bp elevation.  Increased body jerks, nausea   Acetaminophen Hives and Rash    Physical Exam  Today's Vitals   03/10/21 1231  BP: (!) 151/84  Pulse: 69  Weight: 122 lb (55.3 kg)  Height: 5\' 5"  (1.651 m)   Body mass index is 20.3 kg/m.   General: frail pleasant elderly Caucasian lady seated, with mild anxiety Head: head normocephalic and atraumatic.  Neck: supple with no carotid or supraclavicular bruits Cardiovascular: irregular rate and rhythm, no murmurs Musculoskeletal: no deformity Skin:  no rash/petichiae Vascular:  Normal pulses all extremities  Neurologic  Exam Mental Status: Awake and fully alert.  Mild dysarthria with occasional speech hesitancy and word finding difficulty. Oriented to place and time. Recent and remote memory intact. Attention span, concentration and fund of knowledge appropriate. Mood and affect appropriate Cranial Nerves: Pupils equal, briskly reactive to light. Extraocular movements full without nystagmus. Visual fields full to confrontation. Hearing intact. Facial sensation intact. Face, tongue, palate moves normally and symmetrically.  Motor: Normal bulk and tone. Normal strength in all tested extremity muscles except mild left hip flexor weakness and decreased left hand finger dexterity Sensory.:  Intact light touch, vibratory and pinprick sensation Coordination: Rapid alternating movements normal in all extremities except decreased left hand bilaterally. Finger-to-nose and heel-to-shin performed accurately bilaterally.  Mild action tremors bilateral upper extremities.  No tremors at rest or evidence of cogwheel rigidity or bradykinesia Gait and Station: Arises from chair without difficulty. Stance is normal. Gait demonstrates normal stride length and mild unsteadiness/imbalance with use of Rollator walker. Reflexes: 1+ and symmetric. Toes downgoing.       ASSESSMENT/PLAN: 80 year old Caucasian lady with recent embolic right cerebellar infarct secondary to known AF on Eliquis on 01/09/2020 and history of right MCA infarct in February 2020 secondary to paroxysmal atrial fibrillation which was found on outpatient cardiac event monitor after discharge.  Vascular risk factors of hypertension, hyperlipidemia, A. 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  Seizures, late effect of stroke -continue keppra - will switch to  XR formulation 1000mg  nightly - c/o increased episodes of feeling shaking, trembling and feeling generally "bad" typically associated with anxiety and fatigue - possibly worsened since seizure -?med SE  Anxiety disorder -Likely contributing to above symptoms -continue to follow with PCP for monitoring - continues to decline medication management -encouraged her to start sitting yoga at her retirement community as this can help with relaxation  Atrial fibrillation -S/p ablation 03/30/2020 -Remains on Pradaxa for CHA2DS2-VASc score of at least 6 monitored and prescribed by cardiology - refill provided but ongoing refills should be obtained by cardiology  Severe sleep apnea -Followed by Dr. Rexene Alberts - recent [place] 09/2020     Follow-up in 6 months or call earlier as needed    CC:  [name], MD     I spent 36 minutes of face-to-face and non-face-to-face time with patient and husband.  This included previsit chart review, lab review, study review, order entry, electronic health record documentation, patient and husband education regarding prior stroke with residual deficits, importance of ongoing management of stroke risk factors and secondary stroke prevention measures, seizures and ongoing use of Keppra, underlying anxiety disorder and answered all other questions to patient and husband satisfaction  Frann Rider, AGNP-BC  [facility] [address] [place], [place] [address]  Phone [phone] Fax [phone] Note: This document was  prepared with digital dictation and possible smart phrase technology. Any transcriptional errors that result from this process are unintentional.

## 2021-03-10 ENCOUNTER — Encounter: Payer: Self-pay | Admitting: Adult Health

## 2021-03-10 ENCOUNTER — Ambulatory Visit (INDEPENDENT_AMBULATORY_CARE_PROVIDER_SITE_OTHER): Payer: Medicare Other | Admitting: Adult Health

## 2021-03-10 ENCOUNTER — Telehealth: Payer: Self-pay | Admitting: Adult Health

## 2021-03-10 VITALS — BP 151/84 | HR 69 | Ht 65.0 in | Wt 122.0 lb

## 2021-03-10 DIAGNOSIS — I639 Cerebral infarction, unspecified: Secondary | ICD-10-CM

## 2021-03-10 DIAGNOSIS — F411 Generalized anxiety disorder: Secondary | ICD-10-CM | POA: Diagnosis not present

## 2021-03-10 DIAGNOSIS — I69398 Other sequelae of cerebral infarction: Secondary | ICD-10-CM | POA: Diagnosis not present

## 2021-03-10 DIAGNOSIS — Z8673 Personal history of transient ischemic attack (TIA), and cerebral infarction without residual deficits: Secondary | ICD-10-CM

## 2021-03-10 DIAGNOSIS — R569 Unspecified convulsions: Secondary | ICD-10-CM | POA: Diagnosis not present

## 2021-03-10 MED ORDER — LEVETIRACETAM ER 500 MG PO TB24: 1000.0000 mg | ORAL_TABLET | Freq: Every day | ORAL | 5 refills | Status: DC

## 2021-03-10 MED ORDER — LEVETIRACETAM ER 500 MG PO TB24: 500.0000 mg | ORAL_TABLET | Freq: Every day | ORAL | 5 refills | Status: DC

## 2021-03-10 MED ORDER — PRADAXA 150 MG PO CAPS: 150.0000 mg | ORAL_CAPSULE | Freq: Two times a day (BID) | ORAL | 3 refills | Status: DC

## 2021-03-10 NOTE — Telephone Encounter (Signed)
Pt's husband called stating the prescriptions have not been sent in to the [facility] states the pharmacy has no record of it. Also they need some clarity on the directions for the levETIRAcetam (KEPPRA XR) 500 MG 24 hr tablet. [name] requesting a call back.

## 2021-03-10 NOTE — Telephone Encounter (Signed)
Contacted pharmacy for verification of Keppra 500mg , 2x nightly. Receipt confirmed

## 2021-03-10 NOTE — Patient Instructions (Addendum)
Continue keppra - we will change this to XR (extended release formulation) to see if this helps with some of your symptoms which has worsened since seizure  Continue Pradaxa (dabigatran) twice a day  and atorvastatin for secondary stroke prevention  Continue to follow up with PCP regarding cholesterol and blood pressure management  Maintain strict control of hypertension with blood pressure goal below 130/90 and cholesterol with LDL cholesterol (bad cholesterol) goal below 70 mg/dL.   Continue to follow up with Dr. Rexene Alberts for sleep apnea     Followup in the future with me in 6 months or call earlier if needed       Thank you for coming to see [place] at [facility]. I hope we have been able to provide you high quality care today.  You may receive a patient satisfaction survey over the next few weeks. We would appreciate your feedback and comments so that we may continue to improve ourselves and the health of our patients.

## 2021-03-10 NOTE — Telephone Encounter (Signed)
FYI

## 2021-04-01 ENCOUNTER — Ambulatory Visit (INDEPENDENT_AMBULATORY_CARE_PROVIDER_SITE_OTHER): Payer: Medicare Other

## 2021-04-01 DIAGNOSIS — I4821 Permanent atrial fibrillation: Secondary | ICD-10-CM

## 2021-04-01 LAB — CUP PACEART REMOTE DEVICE CHECK
Battery Remaining Longevity: 128 mo
Battery Voltage: 3.02 V
Brady Statistic AP VP Percent: 0 %
Brady Statistic AP VS Percent: 0 %
Brady Statistic AS VP Percent: 99.84 %
Brady Statistic AS VS Percent: 0.16 %
Brady Statistic RA Percent Paced: 0 %
Brady Statistic RV Percent Paced: 99.84 %
Date Time Interrogation Session: 20221207191456
Implantable Lead Implant Date: 20211206
Implantable Lead Implant Date: 20211206
Implantable Lead Location: 753858
Implantable Lead Location: 753860
Implantable Lead Model: 3830
Implantable Lead Model: 5076
Implantable Pulse Generator Implant Date: 20211206
Lead Channel Impedance Value: 323 Ohm
Lead Channel Impedance Value: 361 Ohm
Lead Channel Impedance Value: 380 Ohm
Lead Channel Impedance Value: 456 Ohm
Lead Channel Pacing Threshold Amplitude: 1.125 V
Lead Channel Pacing Threshold Pulse Width: 0.4 ms
Lead Channel Sensing Intrinsic Amplitude: 5.625 mV
Lead Channel Sensing Intrinsic Amplitude: 5.625 mV
Lead Channel Sensing Intrinsic Amplitude: 7.5 mV
Lead Channel Sensing Intrinsic Amplitude: 7.5 mV
Lead Channel Setting Pacing Amplitude: 2.25 V
Lead Channel Setting Pacing Pulse Width: 0.4 ms
Lead Channel Setting Sensing Sensitivity: 2 mV

## 2021-04-09 ENCOUNTER — Other Ambulatory Visit: Payer: Self-pay | Admitting: Family Medicine

## 2021-04-09 NOTE — Progress Notes (Signed)
Remote pacemaker transmission.   

## 2021-04-27 ENCOUNTER — Telehealth: Payer: Self-pay

## 2021-04-27 NOTE — Telephone Encounter (Signed)
PA for Pradaxa 150 mg- approval received via fax from [facility]. Approval through 04/26/2022.

## 2021-05-17 NOTE — Telephone Encounter (Signed)
Pt's husband, Sayge Brienza, receive letter saying denied request to drop the  cost to from tier 4 to tier 3 for Pradaxa. Would like a call from the nurse

## 2021-05-18 NOTE — Telephone Encounter (Signed)
I called the pt's husband back and received clarity on this message. Pt's husband had initiated a tier reduction from a level 4 to a level 3 and is requesting a letter from the provider to support this request. I have formulated the letter and placed on NP's desk for review/signature.

## 2021-05-18 NOTE — Telephone Encounter (Signed)
I called pt's husband and relayed message/recommendation.  He understand anything further on this will need to come from cardiology. Letter placed up front at [facility].

## 2021-05-18 NOTE — Telephone Encounter (Signed)
We provided short term refill at recent visit until she could be seen by cardiology - if there are issues with insurance, this should be handled by cardiology as they are the primary prescriber . Thank you

## 2021-05-18 NOTE — Telephone Encounter (Signed)
Will sign form as requested but any further assistance will need to be through cardiology. Thank you!

## 2021-05-26 ENCOUNTER — Ambulatory Visit (INDEPENDENT_AMBULATORY_CARE_PROVIDER_SITE_OTHER): Payer: Medicare Other

## 2021-05-26 VITALS — Ht 65.0 in | Wt 122.0 lb

## 2021-05-26 DIAGNOSIS — Z0000 Encounter for general adult medical examination without abnormal findings: Secondary | ICD-10-CM

## 2021-05-26 DIAGNOSIS — M810 Age-related osteoporosis without current pathological fracture: Secondary | ICD-10-CM

## 2021-05-26 NOTE — Patient Instructions (Signed)
Mary Mercado , Thank you for taking time to come for your Medicare Wellness Visit. I appreciate your ongoing commitment to your health goals. Please review the following plan we discussed and let me know if I can assist you in the future.   These are the goals we discussed:  Goals      Increase exercise     Continue Yoga.        This is a list of the screening recommended for you and due dates:  Health Maintenance  Topic Date Due   DEXA scan (bone density measurement)  Never done   COVID-19 Vaccine (5 - Booster for Pfizer series) 03/09/2021   Zoster (Shingles) Vaccine (1 of 2) 08/23/2021*   Tetanus Vaccine  05/26/2022*   Pneumonia Vaccine  Completed   Flu Shot  Completed   HPV Vaccine  Aged Out  *Topic was postponed. The date shown is not the original due date.    Advanced directives: Yes On file  Conditions/risks identified: None  Next appointment: Follow up in one year for your annual wellness visit    Preventive Care 65 Years and Older, Female Preventive care refers to lifestyle choices and visits with your health care provider that can promote health and wellness. What does preventive care include? A yearly physical exam. This is also called an annual well check. Dental exams once or twice a year. Routine eye exams. Ask your health care provider how often you should have your eyes checked. Personal lifestyle choices, including: Daily care of your teeth and gums. Regular physical activity. Eating a healthy diet. Avoiding tobacco and drug use. Limiting alcohol use. Practicing safe sex. Taking low-dose aspirin every day. Taking vitamin and mineral supplements as recommended by your health care provider. What happens during an annual well check? The services and screenings done by your health care provider during your annual well check will depend on your age, overall health, lifestyle risk factors, and family history of disease. Counseling  Your health care  provider may ask you questions about your: Alcohol use. Tobacco use. Drug use. Emotional well-being. Home and relationship well-being. Sexual activity. Eating habits. History of falls. Memory and ability to understand (cognition). Work and work Statistician. Reproductive health. Screening  You may have the following tests or measurements: Height, weight, and BMI. Blood pressure. Lipid and cholesterol levels. These may be checked every 5 years, or more frequently if you are over 4 years old. Skin check. Lung cancer screening. You may have this screening every year starting at age 38 if you have a 30-pack-year history of smoking and currently smoke or have quit within the past 15 years. Fecal occult blood test (FOBT) of the stool. You may have this test every year starting at age 102. Flexible sigmoidoscopy or colonoscopy. You may have a sigmoidoscopy every 5 years or a colonoscopy every 10 years starting at age 51. Hepatitis C blood test. Hepatitis B blood test. Sexually transmitted disease (STD) testing. Diabetes screening. This is done by checking your blood sugar (glucose) after you have not eaten for a while (fasting). You may have this done every 1-3 years. Bone density scan. This is done to screen for osteoporosis. You may have this done starting at age 22. Mammogram. This may be done every 1-2 years. Talk to your health care provider about how often you should have regular mammograms. Talk with your health care provider about your test results, treatment options, and if necessary, the need for more tests. Vaccines  Your  health care provider may recommend certain vaccines, such as: Influenza vaccine. This is recommended every year. Tetanus, diphtheria, and acellular pertussis (Tdap, Td) vaccine. You may need a Td booster every 10 years. Zoster vaccine. You may need this after age 22. Pneumococcal 13-valent conjugate (PCV13) vaccine. One dose is recommended after age  51. Pneumococcal polysaccharide (PPSV23) vaccine. One dose is recommended after age 2. Talk to your health care provider about which screenings and vaccines you need and how often you need them. This information is not intended to replace advice given to you by your health care provider. Make sure you discuss any questions you have with your health care provider. Document Released: 05/08/2015 Document Revised: 12/30/2015 Document Reviewed: 02/10/2015 Elsevier Interactive Patient Education  2017 [employer] Prevention in the Home Falls can cause injuries. They can happen to people of all ages. There are many things you can do to make your home safe and to help prevent falls. What can I do on the outside of my home? Regularly fix the edges of walkways and driveways and fix any cracks. Remove anything that might make you trip as you walk through a door, such as a raised step or threshold. Trim any bushes or trees on the path to your home. Use bright outdoor lighting. Clear any walking paths of anything that might make someone trip, such as rocks or tools. Regularly check to see if handrails are loose or broken. Make sure that both sides of any steps have handrails. Any raised decks and porches should have guardrails on the edges. Have any leaves, snow, or ice cleared regularly. Use sand or salt on walking paths during winter. Clean up any spills in your garage right away. This includes oil or grease spills. What can I do in the bathroom? Use night lights. Install grab bars by the toilet and in the tub and shower. Do not use towel bars as grab bars. Use non-skid mats or decals in the tub or shower. If you need to sit down in the shower, use a plastic, non-slip stool. Keep the floor dry. Clean up any water that spills on the floor as soon as it happens. Remove soap buildup in the tub or shower regularly. Attach bath mats securely with double-sided non-slip rug tape. Do not have throw  rugs and other things on the floor that can make you trip. What can I do in the bedroom? Use night lights. Make sure that you have a light by your bed that is easy to reach. Do not use any sheets or blankets that are too big for your bed. They should not hang down onto the floor. Have a firm chair that has side arms. You can use this for support while you get dressed. Do not have throw rugs and other things on the floor that can make you trip. What can I do in the kitchen? Clean up any spills right away. Avoid walking on wet floors. Keep items that you use a lot in easy-to-reach places. If you need to reach something above you, use a strong step stool that has a grab bar. Keep electrical cords out of the way. Do not use floor polish or wax that makes floors slippery. If you must use wax, use non-skid floor wax. Do not have throw rugs and other things on the floor that can make you trip. What can I do with my stairs? Do not leave any items on the stairs. Make sure that there are handrails  on both sides of the stairs and use them. Fix handrails that are broken or loose. Make sure that handrails are as long as the stairways. Check any carpeting to make sure that it is firmly attached to the stairs. Fix any carpet that is loose or worn. Avoid having throw rugs at the top or bottom of the stairs. If you do have throw rugs, attach them to the floor with carpet tape. Make sure that you have a light switch at the top of the stairs and the bottom of the stairs. If you do not have them, ask someone to add them for you. What else can I do to help prevent falls? Wear shoes that: Do not have high heels. Have rubber bottoms. Are comfortable and fit you well. Are closed at the toe. Do not wear sandals. If you use a stepladder: Make sure that it is fully opened. Do not climb a closed stepladder. Make sure that both sides of the stepladder are locked into place. Ask someone to hold it for you, if  possible. Clearly mark and make sure that you can see: Any grab bars or handrails. First and last steps. Where the edge of each step is. Use tools that help you move around (mobility aids) if they are needed. These include: Canes. Walkers. Scooters. Crutches. Turn on the lights when you go into a dark area. Replace any light bulbs as soon as they burn out. Set up your furniture so you have a clear path. Avoid moving your furniture around. If any of your floors are uneven, fix them. If there are any pets around you, be aware of where they are. Review your medicines with your doctor. Some medicines can make you feel dizzy. This can increase your chance of falling. Ask your doctor what other things that you can do to help prevent falls. This information is not intended to replace advice given to you by your health care provider. Make sure you discuss any questions you have with your health care provider. Document Released: 02/05/2009 Document Revised: 09/17/2015 Document Reviewed: 05/16/2014 Elsevier Interactive Patient Education  2017 [employer].

## 2021-05-26 NOTE — Progress Notes (Signed)
Subjective:   Mary Mercado is a 81 y.o. female who presents for Medicare Annual (Subsequent) preventive examination.  Review of Systems    Virtual Visit via Telephone Note  I connected with  Mary Mercado on 05/26/21 at  1:45 PM EST by telephone and verified that I am speaking with the correct person using two identifiers.  Location: Patient: Home Provider: Office Persons participating in the virtual visit: patient/Nurse Health Advisor   I discussed the limitations, risks, security and privacy concerns of performing an evaluation and management service by telephone and the availability of in person appointments. The patient expressed understanding and agreed to proceed.  Interactive audio and video telecommunications were attempted between this nurse and patient, however failed, due to patient having technical difficulties OR patient did not have access to video capability.  We continued and completed visit with audio only.  Some vital signs may be absent or patient reported.   Criselda Peaches, LPN  Cardiac Risk Factors include: advanced age (>88men, >68 women);hypertension     Objective:    Today's Vitals   05/26/21 1406  Weight: 122 lb (55.3 kg)  Height: 5\' 5"  (1.651 m)   Body mass index is 20.3 kg/m.  Advanced Directives 05/26/2021 08/18/2020 03/30/2020 03/30/2020 02/19/2020 02/13/2020 01/14/2020  Does Patient Have a Medical Advance Directive? Yes Yes Yes Yes Yes Yes Yes  Type of Paramedic of [place];Living will [place];Living will [place];Living will [place];Living will [place];Living will [place];Living will [place];Living will  Does patient want to make changes to medical advance directive? No - Patient declined - No - Patient declined No - Patient declined - - -  Copy of [place]  in Chart? No - copy requested - No - copy requested No - copy requested Yes - validated most recent copy scanned in chart (See row information) Yes - validated most recent copy scanned in chart (See row information) No - copy requested    Current Medications (verified) Outpatient Encounter Medications as of 05/26/2021  Medication Sig   atorvastatin (LIPITOR) 80 MG tablet TAKE 1 TABLET BY MOUTH DAILY AT 6 PM.   levETIRAcetam (KEPPRA XR) 500 MG 24 hr tablet Take 2 tablets (1,000 mg total) by mouth at bedtime.   losartan (COZAAR) 50 MG tablet Take 1 tablet (50 mg total) by mouth daily.   metoprolol tartrate (LOPRESSOR) 25 MG tablet TAKE 0.5 TABLETS BY MOUTH AT BEDTIME.   PRADAXA 150 MG CAPS capsule Take 1 capsule (150 mg total) by mouth 2 (two) times daily.   Facility-Administered Encounter Medications as of 05/26/2021  Medication   regadenoson (LEXISCAN) injection SOLN 0.4 mg   technetium tetrofosmin (TC-MYOVIEW) injection 32 millicurie    Allergies (verified) Duloxetine and Acetaminophen   History: Past Medical History:  Diagnosis Date   Allergy    CVA (cerebral vascular accident) ([place]) 05/2018, 01/09/20   Hyperlipidemia LDL goal <70    Hypertension    Palpitations    Vertigo, benign positional    Past Surgical History:  Procedure Laterality Date   AV NODE ABLATION N/A 03/30/2020   Procedure: AV NODE ABLATION;  Surgeon: Deboraha Sprang, MD;  Location: [place] [facility];  Service: Cardiovascular;  Laterality: N/A;   AV NODE ABLATION N/A 03/30/2020   Procedure: AV NODE ABLATION;  Surgeon: Deboraha Sprang, MD;  Location: [place] [facility];  Service: Cardiovascular;  Laterality: N/A;   CARDIOVERSION Left 10/31/2018   Procedure: CARDIOVERSION;  Surgeon: Acie Fredrickson Wonda Cheng, MD;  Location: [facility];  Service: Cardiovascular;  Laterality: Left;   CARDIOVERSION N/A 01/08/2020   Procedure: CARDIOVERSION;  Surgeon: [name], MD;  Location[facility];  Service: Cardiovascular;   Laterality: N/A;   Hemilaminectomy and microdiskectomy at L4-5 on the left.  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   Procedure: PACEMAKER IMPLANT;  Surgeon: Deboraha Sprang, MD;  Location: [place] [facility];  Service: Cardiovascular;  Laterality: N/A;   TEE WITHOUT CARDIOVERSION N/A 06/05/2018   Procedure: TRANSESOPHAGEAL ECHOCARDIOGRAM (TEE);  Surgeon: Lelon Perla, MD;  Location[facility] ENDOSCOPY;  Service: Cardiovascular;  Laterality: N/A;  loop   Family History  Problem Relation Age of Onset   Colon cancer Father    Diabetes Mother    Heart attack Mother        22   Heart attack Brother 71   Social History   Socioeconomic History   Marital status: Married    Spouse name: [name]   Number of children: Not on file   Years of education: 12+   Highest education level: Not on file  Occupational History    Comment: retired  Tobacco Use   Smoking status: Never   Smokeless tobacco: Never  Vaping Use   Vaping Use: Never used  Substance and Sexual Activity   Alcohol use: Never   Drug use: Never   Sexual activity: Not on file  Other Topics Concern   Not on file  Social History Narrative   Lives with husband   caffeine none daily   Right Handed   Social Determinants of Health   Financial Resource Strain: Low Risk    Difficulty of Paying Living Expenses: Not hard at all  Food Insecurity: No Food Insecurity   Worried About Charity fundraiser in the Last Year: Never true   [place] in the Last Year: Never true  Transportation Needs: No Transportation Needs   Lack of Transportation (Medical): No   Lack of Transportation (Non-Medical): No  Physical Activity: Insufficiently Active   Days of Exercise per Week: 2 days   Minutes of Exercise per Session: 30 min  Stress: No Stress Concern Present   Feeling of Stress : Not at all  Social Connections: Socially Integrated   Frequency of Communication with Friends and Family: More than three times a week   Frequency  of Social Gatherings with Friends and Family: More than three times a week   Attends Religious Services: More than 4 times per year   Active Member of [employer] or Organizations: Yes   Attends Music therapist: More than 4 times per year   Marital Status: Married    Tobacco Counseling Counseling given: Not Answered   Clinical Intake:  Pre-visit preparation completed: Yes  Pain : No/denies pain     BMI - recorded: 20.3 Nutritional Status: BMI of 19-24  Normal Nutritional Risks: None Diabetes: No  How often do you need to have someone help you when you read instructions, pamphlets, or other written materials from your doctor or pharmacy?: 1 - Never  Diabetic? No  Interpreter Needed?: No  Information entered by :: Rolene Arbour LPN   Activities of Daily Living In your present state of health, do you have any difficulty performing the following activities: 05/26/2021 03/04/2021  Hearing? N N  Vision? N N  Difficulty concentrating or making decisions? N N  Walking or climbing stairs? N Y  Dressing or bathing? N N  Doing errands, shopping? N [place] and eating ? N -  Using the Toilet? N -  In the past six months, have you accidently leaked urine? N -  Do you have problems with loss of bowel control? N -  Managing your Medications? N -  Managing your Finances? N -  Housekeeping or managing your Housekeeping? N -  Some recent data might be hidden    Patient Care Team: [name], MD as PCP - General Nahser, Wonda Cheng, MD as PCP - Cardiology (Cardiology)  Indicate any recent Medical Services you may have received from other than [facility] providers in the past year (date may be approximate).     Assessment:   This is a routine wellness examination for Mary Mercado.  Hearing/Vision screen Hearing Screening - Comments:: No difficulty hearing Vision Screening - Comments:: No vision difficulty. Followed by Dr Andria Frames  Dietary  issues and exercise activities discussed: Current Exercise Habits: Home exercise routine, Type of exercise: yoga, Time (Minutes): 30, Frequency (Times/Week): 2, Weekly Exercise (Minutes/Week): 60, Intensity: Mild, Exercise limited by: None identified   Goals Addressed             This Visit's Progress    Increase exercise       Continue Yoga.       Depression Screen PHQ 2/9 Scores 05/26/2021 03/04/2021 10/29/2020 02/07/2020 09/20/2018 06/14/2017  PHQ - 2 Score 0 0 2 2 1  0  PHQ- 9 Score - 0 - 5 - -    Fall Risk Fall Risk  05/26/2021 03/04/2021 10/29/2020 07/23/2020 03/24/2020  Falls in the past year? 0 0 0 0 0  Number falls in past yr: 0 0 0 - -  Injury with Fall? 0 0 0 - -  Risk for fall due to : - No Fall Risks - - -    FALL RISK PREVENTION PERTAINING TO THE HOME:  Any stairs in or around the home? Yes  If so, are there any without handrails? No  Home free of loose throw rugs in walkways, pet beds, electrical cords, etc? Yes  Adequate lighting in your home to reduce risk of falls? No   ASSISTIVE DEVICES UTILIZED TO PREVENT FALLS:  Life alert? No  Use of a cane, walker or w/c? Yes  Grab bars in the bathroom? Yes  Shower chair or bench in shower? Yes  Elevated toilet seat or a handicapped toilet? Yes   TIMED UP AND GO:  Was the test performed? No . Audio Visit  Cognitive Function:     6CIT Screen 05/26/2021  What Year? 0 points  What month? 0 points  What time? 0 points  Count back from 20 0 points  Months in reverse 0 points  Repeat phrase 0 points  Total Score 0    Immunizations Immunization History  Administered Date(s) Administered   Fluad Quad(high Dose 65+) 02/11/2019, 02/05/2020, 03/04/2021   Influenza, High Dose Seasonal PF 06/19/2018   PFIZER(Purple Top)SARS-COV-2 Vaccination 05/17/2019, 06/07/2019, 01/12/2021   PPD Test 11/15/2019   Pneumococcal Conjugate-13 02/11/2019   Pneumococcal Polysaccharide-23 03/04/2021    TDAP status: Due, Education has  been provided regarding the importance of this vaccine. Advised may receive this vaccine at local pharmacy or Health Dept. Aware to provide a copy of the vaccination record if obtained from local pharmacy or Health Dept. Verbalized acceptance and understanding.  Flu Vaccine status: Up  to date  Pneumococcal vaccine status: Up to date  Covid-19 vaccine status: Completed vaccines  Qualifies for Shingles Vaccine? Yes   Zostavax completed No   Shingrix Completed?: No.    Education has been provided regarding the importance of this vaccine. Patient has been advised to call insurance company to determine out of pocket expense if they have not yet received this vaccine. Advised may also receive vaccine at local pharmacy or Health Dept. Verbalized acceptance and understanding.  Screening Tests Health Maintenance  Topic Date Due   DEXA SCAN  Never done   COVID-19 Vaccine (5 - Booster for Pfizer series) 03/09/2021   Zoster Vaccines- Shingrix (1 of 2) 08/23/2021 (Originally 05/25/1959)   TETANUS/TDAP  05/26/2022 (Originally 05/25/1959)   Pneumonia Vaccine 67+ Years old  Completed   INFLUENZA VACCINE  Completed   HPV VACCINES  Aged Out    Health Maintenance  Health Maintenance Due  Topic Date Due   DEXA SCAN  Never done   COVID-19 Vaccine (5 - Booster for Pfizer series) 03/09/2021    Bone Density status: Ordered Yes. Pt provided with contact info and advised to call to schedule appt.05/26/21   Additional Screening:   Vision Screening: Recommended annual ophthalmology exams for early detection of glaucoma and other disorders of the eye. Is the patient up to date with their annual eye exam?  Yes  Who is the provider or what is the name of the office in which the patient attends annual eye exams? Dr Lenice Llamas If pt is not established with a provider, would they like to be referred to a provider to establish care? Yes .   Dental Screening: Recommended annual dental exams for proper oral  hygiene  Community Resource Referral / Chronic Care Management: CRR required this visit?  No   CCM required this visit?  No      Plan:     I have personally reviewed and noted the following in the patients chart:   Medical and social history Use of alcohol, tobacco or illicit drugs  Current medications and supplements including opioid prescriptions. Patient currently not taking opioids Functional ability and status Nutritional status Physical activity Advanced directives List of other physicians Hospitalizations, surgeries, and ER visits in previous 12 months Vitals Screenings to include cognitive, depression, and falls Referrals and appointments  In addition, I have reviewed and discussed with patient certain preventive protocols, quality metrics, and best practice recommendations. A written personalized care plan for preventive services as well as general preventive health recommendations were provided to patient.     Criselda Peaches, LPN   11/26/6960   Nurse Notes: Dexa Scan ordered 05/26/21

## 2021-05-31 ENCOUNTER — Other Ambulatory Visit: Payer: Self-pay

## 2021-05-31 MED ORDER — LOSARTAN POTASSIUM 50 MG PO TABS: 50.0000 mg | ORAL_TABLET | Freq: Every day | ORAL | 0 refills | Status: DC

## 2021-06-03 ENCOUNTER — Telehealth: Payer: Self-pay | Admitting: Cardiovascular Disease

## 2021-06-03 ENCOUNTER — Other Ambulatory Visit: Payer: Self-pay | Admitting: Neurology

## 2021-06-03 DIAGNOSIS — I480 Paroxysmal atrial fibrillation: Secondary | ICD-10-CM

## 2021-06-03 MED ORDER — PRADAXA 150 MG PO CAPS: 150.0000 mg | ORAL_CAPSULE | Freq: Two times a day (BID) | ORAL | 1 refills | Status: DC

## 2021-06-03 NOTE — Telephone Encounter (Signed)
Pradaxa 150mg  refill request received. Pt is 81 years old, weight-55.3kg, Crea-0.70 on 03/04/21, last seen by Tommye Standard on 03/08/2021, Diagnosis-Afib, CrCl-54.74ml/min; Dose is appropriate based on dosing criteria. Will send in refill to requested pharmacy.

## 2021-06-03 NOTE — Telephone Encounter (Signed)
° °  Pt c/o medication issue:  1. Name of Medication: PRADAXA 150 MG CAPS capsule  2. How are you currently taking this medication (dosage and times per day)? Take 1 capsule (150 mg total) by mouth 2 (two) times daily.  3. Are you having a reaction (difficulty breathing--STAT)?   4. What is your medication issue? Pt's husband said, when they tried refilling this meds it was cancelled, he called [facility] and he was told to call Dr. Acie Fredrickson instead, he was told it should be Dr. Acie Fredrickson needs to send prescription for this meds

## 2021-06-07 ENCOUNTER — Telehealth: Payer: Self-pay | Admitting: Cardiovascular Disease

## 2021-06-07 MED ORDER — DABIGATRAN ETEXILATE MESYLATE 150 MG PO CAPS: 150.0000 mg | ORAL_CAPSULE | Freq: Two times a day (BID) | ORAL | 6 refills | Status: DC

## 2021-06-07 NOTE — Telephone Encounter (Signed)
New Rx sent in. Left detailed message on home voicemail informing pt Rx has been sent.

## 2021-06-07 NOTE — Telephone Encounter (Signed)
Pt c/o medication issue:  1. Name of Medication: PRADAXA 150 MG CAPS capsule [[id number]]  9. How are you currently taking this medication (dosage and times per day)? Take 1 capsule (150 mg total) by mouth 2 (two) times daily.  3. Are you having a reaction (difficulty breathing--STAT)?   4. What is your medication issue? Pt stated that this med is going to be over $400 , [employer] suggested Dabigatran.   This what they will cover.  Pt would like to know if Dr Acie Fredrickson thinks this is something that would work as well?    Best number [phone]

## 2021-06-10 ENCOUNTER — Telehealth: Payer: Self-pay | Admitting: Cardiovascular Disease

## 2021-06-10 NOTE — Telephone Encounter (Signed)
Left message for patient to call back  

## 2021-06-10 NOTE — Telephone Encounter (Signed)
Patient's husband called back. Informed him that dabigatran is the generic for brand name Pradaxa. Patient thanked me for the call back.

## 2021-06-10 NOTE — Telephone Encounter (Signed)
Patient's spouse is calling wanting to know if generic dabigatran and name brand PRADAXA are the same thing.  He wants to make sure it's the same thing because the dabigatran is cheaper than the pradaxa by $100.  If it's not the same he will buy the name brand then.

## 2021-07-01 ENCOUNTER — Ambulatory Visit (INDEPENDENT_AMBULATORY_CARE_PROVIDER_SITE_OTHER): Payer: Medicare Other

## 2021-07-01 DIAGNOSIS — I480 Paroxysmal atrial fibrillation: Secondary | ICD-10-CM

## 2021-07-01 LAB — CUP PACEART REMOTE DEVICE CHECK
Battery Remaining Longevity: 126 mo
Battery Voltage: 3.02 V
Brady Statistic AP VP Percent: 0 %
Brady Statistic AP VS Percent: 0 %
Brady Statistic AS VP Percent: 99.93 %
Brady Statistic AS VS Percent: 0.07 %
Brady Statistic RA Percent Paced: 0 %
Brady Statistic RV Percent Paced: 99.93 %
Date Time Interrogation Session: 20230309033745
Implantable Lead Implant Date: 20211206
Implantable Lead Implant Date: 20211206
Implantable Lead Location: 753858
Implantable Lead Location: 753860
Implantable Lead Model: 3830
Implantable Lead Model: 5076
Implantable Pulse Generator Implant Date: 20211206
Lead Channel Impedance Value: 323 Ohm
Lead Channel Impedance Value: 361 Ohm
Lead Channel Impedance Value: 380 Ohm
Lead Channel Impedance Value: 475 Ohm
Lead Channel Pacing Threshold Amplitude: 1.125 V
Lead Channel Pacing Threshold Pulse Width: 0.4 ms
Lead Channel Sensing Intrinsic Amplitude: 5 mV
Lead Channel Sensing Intrinsic Amplitude: 5 mV
Lead Channel Sensing Intrinsic Amplitude: 9.75 mV
Lead Channel Sensing Intrinsic Amplitude: 9.75 mV
Lead Channel Setting Pacing Amplitude: 2.25 V
Lead Channel Setting Pacing Pulse Width: 0.4 ms
Lead Channel Setting Sensing Sensitivity: 2 mV

## 2021-07-05 ENCOUNTER — Telehealth: Payer: Self-pay | Admitting: Cardiovascular Disease

## 2021-07-05 MED ORDER — PRADAXA 150 MG PO CAPS: 150.0000 mg | ORAL_CAPSULE | Freq: Two times a day (BID) | ORAL | 5 refills | Status: DC

## 2021-07-05 NOTE — Telephone Encounter (Signed)
Called and spoke to patient. She states that since she started the generic version of her pradaxa that the medication has not dissolved and that she can see the the capsule in her stool. Made patient aware that sometimes there is a ghost shell that she may see in her stool. Patient states that she examined the shell this morning and it was still full of medication and she would like to switch back to the brand name of pradaxa. Discussed with PharmD and we will send in the brand name of pradaxa at the current dose. Rx sent to patient's preferred pharmacy. Patient appreciated the call.   ?

## 2021-07-05 NOTE — Telephone Encounter (Signed)
Pt c/o medication issue: ? ?1. Name of Medication: dabigatran (PRADAXA) 150 MG CAPS capsule ? ?2. How are you currently taking this medication (dosage and times per day)? Generic version twice daily as directed  ? ?3. Are you having a reaction (difficulty breathing--STAT)? Medication not dissolving ? ?4. What is your medication issue? Patient said that the medication is not dissolving. She saw the full capsule in her stool.  ? ?She would like Dr. Acie Fredrickson to write a new rx so that she gets the name brand medicine instead of the generic  ?

## 2021-07-08 DIAGNOSIS — H5231 Anisometropia: Secondary | ICD-10-CM | POA: Diagnosis not present

## 2021-07-08 DIAGNOSIS — H2511 Age-related nuclear cataract, right eye: Secondary | ICD-10-CM | POA: Diagnosis not present

## 2021-07-08 DIAGNOSIS — H402214 Chronic angle-closure glaucoma, right eye, indeterminate stage: Secondary | ICD-10-CM | POA: Diagnosis not present

## 2021-07-08 DIAGNOSIS — H401124 Primary open-angle glaucoma, left eye, indeterminate stage: Secondary | ICD-10-CM | POA: Diagnosis not present

## 2021-07-08 DIAGNOSIS — Z961 Presence of intraocular lens: Secondary | ICD-10-CM | POA: Diagnosis not present

## 2021-07-12 ENCOUNTER — Telehealth: Payer: Self-pay | Admitting: Adult Health

## 2021-07-12 NOTE — Telephone Encounter (Signed)
Likely tablet shell so no worries. Thank you!  ?

## 2021-07-12 NOTE — Telephone Encounter (Signed)
Pt called in to let us know that she took her Keppra last night around 10:00 pm and noticed this morning around 8:15 am that the pills were not dissolved and in her bowel movement. Would like a call back from the nurse to discuss at [phone]. ?

## 2021-07-13 MED ORDER — LEVETIRACETAM 500 MG PO TABS: 500.0000 mg | ORAL_TABLET | Freq: Two times a day (BID) | ORAL | 11 refills | Status: DC

## 2021-07-13 NOTE — Addendum Note (Signed)
Addended by: Frann Rider L on: 07/13/2021 12:49 PM ? ? Modules accepted: Orders ? ?

## 2021-07-13 NOTE — Telephone Encounter (Signed)
Called patient and advised her of NP's message. She is unsure if it's a one time event. She stated she has periods of times when areas go numb, has feelings she cannot explain since changing in Nov.. She stated she should've called sooner about it. She wonders if that is because she is not getting all of medication in her system.  She and husband decided to change back to 500 mg twice a day as before. Husband stated [facility] that this new Rx replaces old Rx and old Rx is discontinued. Otherwise [facility] will not [facility] new [facility] because old Rx was recently filled.  It is insurance issue.  I advised will let NP know to send in new Rx. Patient verbalized understanding, appreciation. ? ?

## 2021-07-13 NOTE — Telephone Encounter (Signed)
Called patient, husband answered, on DPR and I advised him of NP's reply. He stated she is on generic pradaxa now which caused same problem, so MD switched her back to brand pradaxa, and it was no longer an issue.  She is taking levetiracetam per bottle. He stated that it is not a shell but the entire capsule. I advised will discuss with NP and call him back. Husband verbalized understanding, appreciation. ? ?

## 2021-07-13 NOTE — Telephone Encounter (Signed)
If this was a one time event, I would not be worried especially as she has been on XR formulation of Keppra since November but if they wish, we can put her back on IR '500mg'$  twice daily.  ?

## 2021-07-14 NOTE — Progress Notes (Signed)
Remote pacemaker transmission.   

## 2021-08-09 DIAGNOSIS — H401134 Primary open-angle glaucoma, bilateral, indeterminate stage: Secondary | ICD-10-CM | POA: Diagnosis not present

## 2021-08-26 ENCOUNTER — Other Ambulatory Visit: Payer: Self-pay | Admitting: Cardiovascular Disease

## 2021-08-26 DIAGNOSIS — I4819 Other persistent atrial fibrillation: Secondary | ICD-10-CM

## 2021-08-28 ENCOUNTER — Other Ambulatory Visit: Payer: Self-pay | Admitting: Cardiovascular Disease

## 2021-09-01 ENCOUNTER — Encounter: Payer: Self-pay | Admitting: Adult Health

## 2021-09-01 ENCOUNTER — Ambulatory Visit (INDEPENDENT_AMBULATORY_CARE_PROVIDER_SITE_OTHER): Payer: Medicare Other | Admitting: Adult Health

## 2021-09-01 VITALS — BP 149/87 | HR 69 | Ht 65.0 in | Wt 117.0 lb

## 2021-09-01 DIAGNOSIS — I69398 Other sequelae of cerebral infarction: Secondary | ICD-10-CM

## 2021-09-01 DIAGNOSIS — F411 Generalized anxiety disorder: Secondary | ICD-10-CM

## 2021-09-01 DIAGNOSIS — R569 Unspecified convulsions: Secondary | ICD-10-CM | POA: Diagnosis not present

## 2021-09-01 DIAGNOSIS — I639 Cerebral infarction, unspecified: Secondary | ICD-10-CM | POA: Diagnosis not present

## 2021-09-01 DIAGNOSIS — Z8673 Personal history of transient ischemic attack (TIA), and cerebral infarction without residual deficits: Secondary | ICD-10-CM

## 2021-09-01 NOTE — Progress Notes (Signed)
?[facility] ?[address] ?[place]. [place] [address] ?[phone] ? ?     OFFICE FOLLOW-UP NOTE ? ?[name] ?Date of Birth:  24-Dec-1940 ?Medical Record Number:  [id number]  ? ?Reason for visit: Stroke follow-up ? ?Chief complaint: ?Chief Complaint  ?Patient presents with  ? Follow-up  ?  Rm 3 with spouse  ?Pt is well and stable, no new concerns   ?  ? ? ?HPI:  ? ?Mary Mercado is a 81 y.o. 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  Returns today for routine follow-up. ? ? ?Update 09/01/2021 JM: Previously seen 6 months ago accompanied by her husband.  Overall stable without new stroke/TIA symptoms.  Residual deficits stable without worsening. Continues to experience significant anxiety - has not further discussed with PCP. She has been participating in yoga twice weekly which she believes has been helpful.  Compliant on Pradaxa and atorvastatin, denies side effects.  Blood pressure today 149/87.  Occasionally monitors  at home and typically stable.  No additional witnessed seizure activity.  Attempted use of Keppra XR due to concerning symptoms on IR formulation, she decided to return back to IR formulation due to concern of XR capsule not dissolving (found in BM) and c/o intermittent numbness since change to XR.  She is currently on Keppra 500 mg twice daily without specific side effects.  Continues to follow with Dr. Rexene Mercado for OSA on BiPAP reporting nightly usage.  No new concerns at this time. ? ? ? ?History provided for reference purposes only ?Update 03/10/2021 JM: Returns for 74-monthstroke and seizure follow-up accompanied by her husband ? ?Stable  since prior visit -denies new stroke/TIA symptoms or seizure activity ?Reports continued imbalance and left hand and foot occasional numbness which has been present since her prior strokes.  Continues to ambulate with RW -denies any recent falls. ?She does report episodes typically occurring midday and evening consisting of feeling shaky/trembling, and just feeling generally "bad" and will improve after sitting for a minute.  No other associated symptoms such as increased weakness, altered awareness/confusion, loss of consciousness, headache or speech changes.  At first, she reported this has been persistent since her stroke but then mentioned it has become more frequent since her seizure. She does believe it usually occurs with increased fatigue or anxiety. This was previously discussed at prior visit almost 2 years ago (see OV 04/2019).  She becomes very anxious with increased L>R UE tremor while discussing this.  ? ?Compliant on Keppra 500 mg twice daily  ?Compliant on Pradaxa and atorvastatin 80 mg daily -denies side effects ?Blood pressure today 151/84 - occasionally monitors at home - has been running 140s/90s - cardiology is aware ? ?Continues to follow with Dr. ARexene Albertsfor sleep apnea monitoring and management ? ?No further concerns at this time ? ? ?Update 09/07/2020 Mary Mercado Patient is seen today accompanied by her husband following recent hospital admission for seizures in March 2022.  She is seen today for a new problem of seizures.  Patient developed sudden onset of speech difficulties on 06/28/2020 with left upper extremity twitching's.  During transport by EMS she also complained of right-sided headache.  Upon arrival in the ER her twitchings  resolved after treatment with IV lorazepam except for mild left thumb twitching.  She was loaded with IV Keppra and the involuntary movements stopped.  EEG showed focal right-sided slowing and MRI scan was negative for acute infarct LDL cholesterol was 71 mg percent  and home statin dose was continued.  Hemoglobin A1c was 6.4.  Patient was on Pradaxa 150 mg twice daily which was continued.  Patient states she is tolerating Keppra well without any dizziness or other side effects.  She has had no recurrent seizures or twitching's.  She has had no recurrent stroke or TIA symptoms either.  She is tolerating Pradaxa well without bruising or bleeding.  She is seeing Dr. Rexene Mercado for her sleep apnea and has been using her CPAP every night.  Blood pressure is normally well controlled at home the today it is elevated in office at 150/86.  She has no new complaints.  She has no prior history of seizures, significant head injury with loss of consciousness.  But she does have history of multiple strokes in the past2/2020 admitted for left-sided numbness weakness, status post TPA, MRI showed right frontal parietal infarct.  CTA head neck showed right P2, left VA, bilateral ICA siphon severe stenosis.  EF 60 to 65%.  Negative for DVT, TEE negative.  LDL 117, A1c 5.9.  Discharged with DAPT and Lipitor 40 ?30-day CardioNet monitoring showed A. fib patient started on Eliquis. ?12/2019  Admitted for right cerebellar infarct.  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  EF 60 to 65%, LDL 81, A1c 5.9.  Eliquis changed to Pradaxa, continue Lipitor 80. ? ? ?Update 06/03/2020 JM: Ms. Mercado returns for 93-monthstroke follow-up.  She has been stable from a stroke standpoint since prior visit without new stroke/TIA symptoms and reports residual gait unsteadiness with imbalance and dysarthria. She continues to use rolling walker and denies any recent falls. She also reports chronic left leg and torso numbness with occasional burning sensation which is chronic and denies worsening. She has remained on Pradaxa and atorvastatin 80 mg daily without side effects.  Blood pressure today 147/86.  She has routinely followed with cardiology for continued dizziness which  she reports is in relation to blood pressure and cardiology currently working on adjustments.  She was evaluated by Dr. ARexene Albertsand was found to have severe sleep apnea. Recommended titration study but patient requested holding off until further recovery from undergoing ablation for persistent A. 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 She is not ready to proceed with titration study. No further concerns at this time. ? ?Initial follow-up 02/03/2020 JM: She was discharged home from [facility] 01/22/2020 after an 8-day stay.  Since discharge, she reports residual " whooshy head" sensation, gait unsteadiness and mild dysarthria but reports ongoing improvement.  Recommended outpatient PT/OT/SLP at discharge from CIR but referral was not placed at discharge.  She has continued to do exercises at home as recommended during CIR stay.  Continues to use Rollator walker for ambulation and denies any recent falls.  Denies vertigo or dizziness sensation.  Denies new or worsening stroke/TIA symptoms.  Prior stroke deficit of left-sided paresthesias greatly improving and only experience occasional numbness/tingling.  Remains on Pradaxa without bleeding or bruising.  Remains on atorvastatin 80 mg daily without myalgias.  Blood pressure today 122/76.  Monitors at home which has been stable.  She does report urinary frequency over the past week and is concerned for possible  UTI but has been increasing water intake and drinking cranberry juice.  Denies fever, dysuria or hematuria.  Typically, nocturia only on occasion but does report snoring, daytime naps and occasional insomnia.  She has not previously underwent sleep study.  No further concerns at this time. ? ?Stroke admission 01/09/2020 ?Presented to ER with slurred speech, facial droop vertigo and ataxia while at her cardiologist office the morning of 01/09/2020.  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  Switched  anticoagulation to Pradaxa.  HTN stable.  LDL 81 and increase atorvastatin from 40 mg to 80 mg daily.  Other stroke risk factors include advanced age and history of right MCA stroke in 05/2018.  Evaluat

## 2021-09-01 NOTE — Patient Instructions (Addendum)
Continue Pradaxa (dabigatran) twice a day  and atorvastatin  for secondary stroke prevention ? ?Continue keppra '500mg'$  twice daily for seizure prevention ? ?Continue to follow with Dr. Rexene Alberts for BiPAP monitoring - would recommend you schedule a follow up visit with her  ? ?Continue to follow up with PCP regarding cholesterol and blood pressure management  ?Maintain strict control of hypertension with blood pressure goal below 130/90 and cholesterol with LDL cholesterol (bad cholesterol) goal below 70 mg/dL.  ? ?Signs of a Stroke? Follow the BEFAST method:  ?Balance Watch for a sudden loss of balance, trouble with coordination or vertigo ?Eyes Is there a sudden loss of vision in one or both eyes? Or double vision?  ?Face: Ask the person to smile. Does one side of the face droop or is it numb?  ?Arms: Ask the person to raise both arms. Does one arm drift downward? Is there weakness or numbness of a leg? ?Speech: Ask the person to repeat a simple phrase. Does the speech sound slurred/strange? Is the person confused ? ?Time: If you observe any of these signs, call 911. ? ? ? ? ?Followup in the future with me in 6 months or call earlier if needed ? ? ? ? ? ? ?Thank you for coming to see [place] at [facility]. I hope we have been able to provide you high quality care today. ? ?You may receive a patient satisfaction survey over the next few weeks. We would appreciate your feedback and comments so that we may continue to improve ourselves and the health of our patients. ? ?

## 2021-09-06 DIAGNOSIS — H401134 Primary open-angle glaucoma, bilateral, indeterminate stage: Secondary | ICD-10-CM | POA: Diagnosis not present

## 2021-09-08 ENCOUNTER — Ambulatory Visit: Payer: Medicare Other | Admitting: Adult Health

## 2021-09-21 ENCOUNTER — Encounter: Payer: Self-pay | Admitting: Cardiovascular Disease

## 2021-09-21 ENCOUNTER — Ambulatory Visit (INDEPENDENT_AMBULATORY_CARE_PROVIDER_SITE_OTHER): Payer: Medicare Other | Admitting: Cardiovascular Disease

## 2021-09-21 VITALS — BP 158/70 | HR 70 | Ht 65.0 in | Wt 115.4 lb

## 2021-09-21 DIAGNOSIS — I10 Essential (primary) hypertension: Secondary | ICD-10-CM | POA: Diagnosis not present

## 2021-09-21 DIAGNOSIS — E782 Mixed hyperlipidemia: Secondary | ICD-10-CM

## 2021-09-21 DIAGNOSIS — I639 Cerebral infarction, unspecified: Secondary | ICD-10-CM

## 2021-09-21 DIAGNOSIS — I4821 Permanent atrial fibrillation: Secondary | ICD-10-CM

## 2021-09-21 MED ORDER — AMLODIPINE BESYLATE 2.5 MG PO TABS: 2.5000 mg | ORAL_TABLET | Freq: Every day | ORAL | 3 refills | Status: DC

## 2021-09-21 NOTE — Progress Notes (Signed)
Cardiology Office Note:    Date:  09/21/2021   ID:  [facility] Mary, Mercado [birth year], MRN [id number]  PCP:  [name], MD  Cardiologist:  Mertie Moores, MD  Electrophysiologist:  None   Referring MD: [name], MD    Problem List 1. Atrial Fib:    CHADS2VASC =  5   ( female, age 81, CVA Chief Complaint  Patient presents with   Atrial Fibrillation    Aug. 7, 2020     Mary Mercado is a 81 y.o. female with a hx of rapid atrial fib.    We cardioverted her on July 7. Is doing great.   Is having some dizziness.  Constant.  Not related to orthostatic.  Is not dizzy when she is lying down      Thinks its due to the metoprolol   Nov. 9, 2020  Mary Mercado is seen today for follow up of her atrial fib Doing well No cp Breathing is ok Has some left lower rib soreness.  Has lots of dizziness.   Especially after starting the telmisartan .    Reviewed her BP log  No readings below 100 .   Most blood pressure readings since starting the telmisartan are in the normal range.  Her blood pressure was mildly elevated before starting telmisartan.  August 09, 2019: Mary Mercado is seen today for follow-up of her atrial fibrillation.  She had a successful cardioversion July, 2020.  She remains in normal sinus rhythm. Dizziness has improved.   Having back pain - is on ultram   Has the sensation of quivering inside - she does not think its a HR irregularity   Sept. 9, 2021:   Seen for follow up . Has PAF - is in atrial fib today  Has had palpitations for the past several weeks.  Has been on eliquis without interruption.  Tried some Dilt 30 mg tabks Having some chest twinges  -  Oct. 15, 2021: Pt has hx of persistent AF. Was cardioverted Sept. 15. Was admitted with CVA on Sept. 16.  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 She had some tachycardia on Oct. 9.  Called the weekend on call PA .  Mary Mercado, [place] recommended additional Diltiazem. ( she would later be diagnosed with a UTI which may have contributed to her tachycardia )  Eliquis had been switched to Pradaxa.  She saw her Primary MD ( Dr. Sarajane Jews) on Oct. 13.  He increased the Diltiazem to 180 mg a day   We discussed afib clinic to consider alternatives antiarrhythmics and also to consider Afib ablation .  We discussed doing a TEE prior to any other cardioversion attempts.   Sep 02, 2020  Has persistent Afib   Had a cardioversion - complicated by a CVA the following day  Has had AV node ablation with pacer placement  Had a seizure on March .  Is now on Keppra    Sep 21, 2021: Mary Mercado seen today follow-up of her persistent atrial fibrillation.  She has a ventricular pacer following AV node ablation.  Brought a list of questions / concerns       Past Medical History:  Diagnosis Date   Allergy  CVA (cerebral vascular accident) ([place]) 05/2018, 01/09/20   Hyperlipidemia LDL goal <70    Hypertension    Palpitations    Vertigo, benign positional     Past Surgical History:  Procedure Laterality Date   AV NODE ABLATION N/A 03/30/2020   Procedure: AV NODE ABLATION;  Surgeon: Deboraha Sprang, MD;  Location: [facility];  Service: Cardiovascular;  Laterality: N/A;   AV NODE ABLATION N/A 03/30/2020   Procedure: AV NODE ABLATION;  Surgeon: Deboraha Sprang, MD;  Location: [facility];  Service: Cardiovascular;  Laterality: N/A;   CARDIOVERSION Left 10/31/2018   Procedure: CARDIOVERSION;  Surgeon: Acie Fredrickson Wonda Cheng, MD;  Location: [facility];  Service: Cardiovascular;  Laterality: Left;   CARDIOVERSION N/A 01/08/2020   Procedure: CARDIOVERSION;  Surgeon: [name], MD;  Location: [facility];  Service: Cardiovascular;   Laterality: N/A;   Hemilaminectomy and microdiskectomy at L4-5 on the left.  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   Procedure: PACEMAKER IMPLANT;  Surgeon: Deboraha Sprang, MD;  Location: [facility];  Service: Cardiovascular;  Laterality: N/A;   TEE WITHOUT CARDIOVERSION N/A 06/05/2018   Procedure: TRANSESOPHAGEAL ECHOCARDIOGRAM (TEE);  Surgeon: Lelon Perla, MD;  Location: [facility] ENDOSCOPY;  Service: Cardiovascular;  Laterality: N/A;  loop    Current Medications: Current Meds  Medication Sig   amLODipine (NORVASC) 2.5 MG tablet Take 1 tablet (2.5 mg total) by mouth daily.   atorvastatin (LIPITOR) 80 MG tablet TAKE 1 TABLET BY MOUTH DAILY AT 6 PM.   dorzolamide (TRUSOPT) 2 % ophthalmic solution 1 drop 2 (two) times daily.   levETIRAcetam (KEPPRA) 500 MG tablet Take 1 tablet (500 mg total) by mouth 2 (two) times daily.   losartan (COZAAR) 50 MG tablet TAKE 1 TABLET EVERY DAY   metoprolol tartrate (LOPRESSOR) 25 MG tablet TAKE 1/2 TABLET BY MOUTH AT BEDTIME   PRADAXA 150 MG CAPS capsule Take 1 capsule (150 mg total) by mouth 2 (two) times daily.     Allergies:   Duloxetine and Acetaminophen   Social History   Socioeconomic History   Marital status: Married    Spouse name: [name]   Number of children: Not on file   Years of education: 12+   Highest education level: Not on file  Occupational History    Comment: retired  Tobacco Use   Smoking status: Never   Smokeless tobacco: Never  Vaping Use   Vaping Use: Never used  Substance and Sexual Activity   Alcohol use: Never   Drug use: Never   Sexual activity: Not on file  Other Topics Concern   Not on file  Social History Narrative   Lives with husband   caffeine none daily   Right Handed   Social Determinants of Health   Financial Resource Strain: Low Risk    Difficulty of Paying Living Expenses: Not hard at all  Food Insecurity: No Food Insecurity   Worried About Charity fundraiser in the Last Year:  Never true   [place] in the Last Year: Never true  Transportation Needs: No Transportation Needs   Lack of Transportation (Medical): No   Lack of Transportation (Non-Medical): No  Physical Activity: Insufficiently Active   Days of Exercise per Week: 2 days   Minutes of Exercise per Session: 30 min  Stress: No Stress Concern Present   Feeling of Stress : Not at all  Social Connections: Socially Integrated   Frequency of Communication  with Friends and Family: More than three times a week   Frequency of Social Gatherings with Friends and Family: More than three times a week   Attends Religious Services: More than 4 times per year   Active Member of [employer] or Organizations: Yes   Attends Music therapist: More than 4 times per year   Marital Status: Married     Family History: The patient's family history includes Colon cancer in her father; Diabetes in her mother; Heart attack in her mother; Heart attack (age of onset: 45) in her brother.  ROS:   Please see the history of present illness.     All other systems reviewed and are negative.  EKGs/Labs/Other Studies Reviewed:    The following studies were reviewed today:  Recent Labs: 03/04/2021: ALT 17; BUN 13; Creatinine, Ser 0.70; Hemoglobin 14.3; Platelets 208.0; Potassium 3.9; Sodium 141; TSH 3.43  Recent Lipid Panel    Component Value Date/Time   CHOL 162 03/04/2021 0902   TRIG 69.0 03/04/2021 0902   HDL 71.80 03/04/2021 0902   CHOLHDL 2 03/04/2021 0902   VLDL 13.8 03/04/2021 0902   LDLCALC 77 03/04/2021 0902   LDLCALC 90 11/14/2019 1102       EKG:    Sep 21, 2021: Atrial fibrillation.  She is ventricular paced at 70.    ASSESSMENT:    1. Permanent atrial fibrillation ([place])   2. Primary hypertension   3. Mixed hyperlipidemia    PLAN:      1.  Atrial fibrillation:   S/p AV node ablation  She is feeling well -    2.  Dizziness:    3.   HTN:    add amlodipine 2.5 a day  Have her  follow up with Richardson Dopp, PA   Cont current meds.     Medication Adjustments/Labs and Tests Ordered: Current medicines are reviewed at length with the patient today.  Concerns regarding medicines are outlined above.  Orders Placed This Encounter  Procedures   Basic metabolic panel   ALT   Lipid panel   EKG 12-Lead   Meds ordered this encounter  Medications   amLODipine (NORVASC) 2.5 MG tablet    Sig: Take 1 tablet (2.5 mg total) by mouth daily.    Dispense:  90 tablet    Refill:  3     Patient Instructions  Medication Instructions:  START Amlodipine 2.'5mg'$  daily *If you need a refill on your cardiac medications before your next appointment, please call your pharmacy*   Lab Work: BMET, Lipids, ALT Today If you have labs (blood work) drawn today and your tests are completely normal, you will receive your results only by: [place] (if you have MyChart) OR A paper copy in the mail If you have any lab test that is abnormal or we need to change your treatment, we will call you to review the results.   Testing/Procedures: NONE   Follow-Up: At [facility], you and your health needs are our priority.  As part of our continuing mission to provide you with exceptional heart care, we have created designated Provider Care Teams.  These Care Teams include your primary Cardiologist (physician) and Advanced Practice Providers (APPs -  Physician Assistants and Nurse Practitioners) who all work together to provide you with the care you need, when you need it.  Your next appointment:   6 month(s)  The format for your next appointment:   In Person  Provider:   Richardson Dopp, PA {  Important Information About Sugar         Signed, Mertie Moores, MD  09/21/2021 5:46 PM    [facility]

## 2021-09-21 NOTE — Patient Instructions (Signed)
Medication Instructions:  START Amlodipine 2.'5mg'$  daily *If you need a refill on your cardiac medications before your next appointment, please call your pharmacy*   Lab Work: BMET, Lipids, ALT Today If you have labs (blood work) drawn today and your tests are completely normal, you will receive your results only by: [place] (if you have MyChart) OR A paper copy in the mail If you have any lab test that is abnormal or we need to change your treatment, we will call you to review the results.   Testing/Procedures: NONE   Follow-Up: At [facility], you and your health needs are our priority.  As part of our continuing mission to provide you with exceptional heart care, we have created designated Provider Care Teams.  These Care Teams include your primary Cardiologist (physician) and Advanced Practice Providers (APPs -  Physician Assistants and Nurse Practitioners) who all work together to provide you with the care you need, when you need it.  Your next appointment:   6 month(s)  The format for your next appointment:   In Person  Provider:   Richardson Dopp, PA {  Important Information About Sugar

## 2021-09-22 LAB — LIPID PANEL
Chol/HDL Ratio: 1.9 ratio (ref 0.0–4.4)
Cholesterol, Total: 161 mg/dL (ref 100–199)
HDL: 84 mg/dL (ref >39)
LDL Chol Calc (NIH): 67 mg/dL (ref 0–99)
Triglycerides: 49 mg/dL (ref 0–149)
VLDL Cholesterol Cal: 10 mg/dL (ref 5–40)

## 2021-09-22 LAB — BASIC METABOLIC PANEL
BUN/Creatinine Ratio: 16 (ref 12–28)
BUN: 14 mg/dL (ref 8–27)
CO2: 24 mmol/L (ref 20–29)
Calcium: 9.6 mg/dL (ref 8.7–10.3)
Chloride: 103 mmol/L (ref 96–106)
Creatinine, Ser: 0.88 mg/dL (ref 0.57–1.00)
Glucose: 100 mg/dL — ABNORMAL HIGH (ref 70–99)
Potassium: 4.7 mmol/L (ref 3.5–5.2)
Sodium: 143 mmol/L (ref 134–144)
eGFR: 66 mL/min/{1.73_m2} (ref >59)

## 2021-09-22 LAB — ALT: ALT: 22 [IU]/L (ref 0–32)

## 2021-09-30 ENCOUNTER — Ambulatory Visit (INDEPENDENT_AMBULATORY_CARE_PROVIDER_SITE_OTHER): Payer: Medicare Other

## 2021-09-30 DIAGNOSIS — Z950 Presence of cardiac pacemaker: Secondary | ICD-10-CM

## 2021-09-30 LAB — CUP PACEART REMOTE DEVICE CHECK
Battery Remaining Longevity: 125 mo
Battery Voltage: 3.01 V
Brady Statistic AP VP Percent: 0 %
Brady Statistic AP VS Percent: 0 %
Brady Statistic AS VP Percent: 99.95 %
Brady Statistic AS VS Percent: 0.05 %
Brady Statistic RA Percent Paced: 0 %
Brady Statistic RV Percent Paced: 99.95 %
Date Time Interrogation Session: 20230607233009
Implantable Lead Implant Date: 20211206
Implantable Lead Implant Date: 20211206
Implantable Lead Location: 753858
Implantable Lead Location: 753860
Implantable Lead Model: 3830
Implantable Lead Model: 5076
Implantable Pulse Generator Implant Date: 20211206
Lead Channel Impedance Value: 323 Ohm
Lead Channel Impedance Value: 380 Ohm
Lead Channel Impedance Value: 399 Ohm
Lead Channel Impedance Value: 494 Ohm
Lead Channel Pacing Threshold Amplitude: 1.125 V
Lead Channel Pacing Threshold Pulse Width: 0.4 ms
Lead Channel Sensing Intrinsic Amplitude: 10.375 mV
Lead Channel Sensing Intrinsic Amplitude: 10.375 mV
Lead Channel Sensing Intrinsic Amplitude: 8.375 mV
Lead Channel Sensing Intrinsic Amplitude: 8.375 mV
Lead Channel Setting Pacing Amplitude: 2.25 V
Lead Channel Setting Pacing Pulse Width: 0.4 ms
Lead Channel Setting Sensing Sensitivity: 2 mV

## 2021-10-02 ENCOUNTER — Other Ambulatory Visit: Payer: Self-pay | Admitting: Family Medicine

## 2021-10-08 NOTE — Progress Notes (Signed)
Remote pacemaker transmission.   

## 2021-10-25 ENCOUNTER — Ambulatory Visit (INDEPENDENT_AMBULATORY_CARE_PROVIDER_SITE_OTHER): Payer: Medicare Other | Admitting: Neurology

## 2021-10-25 ENCOUNTER — Encounter: Payer: Self-pay | Admitting: Neurology

## 2021-10-25 VITALS — BP 125/78 | HR 69 | Ht 65.0 in | Wt 111.2 lb

## 2021-10-25 DIAGNOSIS — I639 Cerebral infarction, unspecified: Secondary | ICD-10-CM

## 2021-10-25 DIAGNOSIS — G4731 Primary central sleep apnea: Secondary | ICD-10-CM | POA: Diagnosis not present

## 2021-10-25 DIAGNOSIS — G4733 Obstructive sleep apnea (adult) (pediatric): Secondary | ICD-10-CM

## 2021-10-25 NOTE — Patient Instructions (Addendum)
It was nice to see you both again!  You are fully compliant with your ASV machine, keep up the good work!  Your apnea scores are generally good and your leak from the new mask is very low.  Please continue using your ASV regularly. While your insurance requires that you use the machine at least 4 hours each night on 70% of the nights, I recommend, that you not skip any nights and use it throughout the night if you can. Getting used to using a PAP (positive airway pressure) machine and staying with the treatment long term does take time and patience and discipline. Untreated obstructive sleep apnea when it is moderate to severe can have an adverse impact on cardiovascular health and raise her risk for heart disease, arrhythmias, hypertension, congestive heart failure, stroke and diabetes. Untreated obstructive sleep apnea causes sleep disruption, nonrestorative sleep, and sleep deprivation. This can have an impact on your day to day functioning and cause daytime sleepiness and impairment of cognitive function, memory loss, mood disturbance, and problems focussing. Using a PAP machine regularly can improve these symptoms.  You can follow-up in 1 year routinely to see Mary Mercado for sleep apnea.

## 2021-10-25 NOTE — Progress Notes (Signed)
Subjective:    Patient ID: Mary Mercado is a 81 y.o. female.  HPI    Interim history:   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  The patient is accompanied by her husband again today.  I last saw her on 10/08/20, at which time she was fully compliant with her ASV. She reported doing well, she had some trouble tolerating the pressure and I suggested we reduce her EPAP to 8 cm from 9 cm at the time. she was given a Simplus FFM to try, she was using the AirTouch FFM at the time.   Today, 10/25/2021: I reviewed her ASV compliance data from 09/20/2021 through 10/19/2021, which is a total of 30 days, during which time she used her machine every night with percent use days greater than 4 hours at 100%, indicating superb compliance with an average usage of 8 hours and 40 minutes, residual AHI good/borderline at 6.1/h, with low leak of 0.7 L/min for the 95th percentile on an EPAP of 8 cm, minimum pressure support of 3, maximum pressure support of 12.  She reports doing well, for the past 2 months she used a new mask that she likes a lot more and it leaks very little.  She is using a small/medium Evora fullface mask from [employer].  She is motivated to continue with treatment and has not had any issues using her ASV.  She monitors her events on the app.  She recently started amlodipine low-dose as per cardiologist.  The patient's allergies, current medications, family history, past medical history, past social history, past surgical  history and problem list were reviewed and updated as appropriate.   Previously:   I first met her at the request of Dr. Leonie Man on 02/27/2020, at which time she reported snoring and daytime somnolence.  She was advised to proceed with sleep testing.  She had a baseline sleep study on 03/12/2020 which indicated severe obstructive sleep apnea with a total AHI of 44.6/h, O2 nadir 86%.  She was advised to proceed with a titration study.  She had this on 06/22/2020.  She had a trial of CPAP but had significant central apneas even on lower pressures.  She was switched to standard BiPAP therapy at 10/6 centimeters with ongoing central apneas, addition of a backup rate was insufficient to improve her central sleep disordered breathing and she had evidence of Cheyne-Stokes type breathing briefly as well.  She was eventually switched to ASV with a final EPAP of 10 cm, minimum pressure support of 3 cm and maximum pressure support of 12 cm, on ASV of 9 cm she did achieve an AHI of 1/h, O2 nadir 94% with nonsupine REM sleep achieved.  She was advised to start home ASV with an EPAP of 9 cm.  Set up date was 07/21/2020.   I reviewed her ASV compliance data from 09/07/2020 through 10/06/2020, which is a total of 30 days, during which time she used her machine every night with percent use days greater than 4 hours at 100%, indicating superb compliance with an average usage of 7 hours and 39 minutes,  residual AHI borderline at 6/h, leak acceptable with a 95th percentile at 12.6 L/min on an EPAP of 9 cm, minimum pressure support of 3 cm, maximum pressure support of 12 cm.      02/27/20: (She) reports snoring and some daytime somnolence.  She has been taking a nap on a nearly daily basis since her stroke in September.  She feels fatigued but also some degree of shortness of breath because of her A. fib.  Sometimes she just feels jittery.  She has been in outpatient therapy and is doing well.  She uses a rolling walker consistently in  the house and outside the house since her stroke.  She has not fallen.  Her snoring is mild per husband.  She denies any recurrent morning headaches but does have nocturia about twice per average night.  Bedtime is around 10 PM and rise time around 7 or 8 AM.  She is not aware of any family history of sleep apnea, denies restless leg symptoms or leg twitching at night.  She often naps after lunch for up to 2 hours.  I reviewed your office note from 02/03/2020.  Her Epworth sleepiness score is 7/24, fatigue severity score is 38 out of 63.   Her Past Medical History Is Significant For: Past Medical History:  Diagnosis Date   Allergy    CVA (cerebral vascular accident) ([place]) 05/2018, 01/09/20   Hyperlipidemia LDL goal <70    Hypertension    Palpitations    Vertigo, benign positional     Her Past Surgical History Is Significant For: Past Surgical History:  Procedure Laterality Date   AV NODE ABLATION N/A 03/30/2020   Procedure: AV NODE ABLATION;  Surgeon: Deboraha Sprang, MD;  Location: [facility];  Service: Cardiovascular;  Laterality: N/A;   AV NODE ABLATION N/A 03/30/2020   Procedure: AV NODE ABLATION;  Surgeon: Deboraha Sprang, MD;  Location: [facility];  Service: Cardiovascular;  Laterality: N/A;   CARDIOVERSION Left 10/31/2018   Procedure: CARDIOVERSION;  Surgeon: Acie Fredrickson Wonda Cheng, MD;  Location: [facility];  Service: Cardiovascular;  Laterality: Left;   CARDIOVERSION N/A 01/08/2020   Procedure: CARDIOVERSION;  Surgeon: [name], MD;  Location: [facility];  Service: Cardiovascular;  Laterality: N/A;   Hemilaminectomy and microdiskectomy at L4-5 on the left.  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   Procedure: PACEMAKER IMPLANT;  Surgeon: Deboraha Sprang, MD;  Location: [facility];  Service: Cardiovascular;  Laterality: N/A;   TEE WITHOUT CARDIOVERSION N/A 06/05/2018   Procedure: TRANSESOPHAGEAL ECHOCARDIOGRAM (TEE);  Surgeon: Lelon Perla, MD;   Location: [facility];  Service: Cardiovascular;  Laterality: N/A;  loop    Her Family History Is Significant For: Family History  Problem Relation Age of Onset   Diabetes Mother    Heart attack Mother        16   Colon cancer Father    Heart attack Brother 36   Sleep apnea Neg Hx     Her Social History Is Significant For: Social History   Socioeconomic History   Marital status: Married    Spouse name: [name]   Number of children: Not on file   Years of education: 12+   Highest education level: Not on file  Occupational History    Comment: retired  Tobacco Use   Smoking status: Never   Smokeless tobacco: Never  Vaping Use   Vaping Use: Never used  Substance and Sexual Activity  Alcohol use: Never   Drug use: Never   Sexual activity: Not on file  Other Topics Concern   Not on file  Social History Narrative   Lives with husband   caffeine none daily   Right Handed   Social Determinants of Health   Financial Resource Strain: Low Risk  (05/26/2021)   Overall Financial Resource Strain (CARDIA)    Difficulty of Paying Living Expenses: Not hard at all  Food Insecurity: No Food Insecurity (05/26/2021)   Hunger Vital Sign    Worried About Running Out of Food in the Last Year: Never true    Ran Out of Food in the Last Year: Never true  Transportation Needs: No Transportation Needs (05/26/2021)   PRAPARE - Hydrologist (Medical): No    Lack of Transportation (Non-Medical): No  Physical Activity: Insufficiently Active (05/26/2021)   Exercise Vital Sign    Days of Exercise per Week: 2 days    Minutes of Exercise per Session: 30 min  Stress: No Stress Concern Present (05/26/2021)   [place]    Feeling of Stress : Not at all  Social Connections: [place] (05/26/2021)   Social Connection and Isolation Panel [NHANES]    Frequency of Communication with Friends and Family:  More than three times a week    Frequency of Social Gatherings with Friends and Family: More than three times a week    Attends Religious Services: More than 4 times per year    Active Member of [employer] or Organizations: Yes    Attends Music therapist: More than 4 times per year    Marital Status: Married    Her Allergies Are:  Allergies  Allergen Reactions   Duloxetine     Causes Bp elevation.  Increased body jerks, nausea   Acetaminophen Hives and Rash  :   Her Current Medications Are:  Outpatient Encounter Medications as of 10/25/2021  Medication Sig   amLODipine (NORVASC) 2.5 MG tablet Take 1 tablet (2.5 mg total) by mouth daily.   atorvastatin (LIPITOR) 80 MG tablet TAKE 1 TABLET BY MOUTH DAILY AT 6 PM.   dorzolamide (TRUSOPT) 2 % ophthalmic solution 1 drop 2 (two) times daily.   levETIRAcetam (KEPPRA) 500 MG tablet Take 1 tablet (500 mg total) by mouth 2 (two) times daily.   losartan (COZAAR) 50 MG tablet TAKE 1 TABLET EVERY DAY   metoprolol tartrate (LOPRESSOR) 25 MG tablet TAKE 1/2 TABLET BY MOUTH AT BEDTIME   PRADAXA 150 MG CAPS capsule Take 1 capsule (150 mg total) by mouth 2 (two) times daily.   Facility-Administered Encounter Medications as of 10/25/2021  Medication   regadenoson (LEXISCAN) injection SOLN 0.4 mg   technetium tetrofosmin (TC-MYOVIEW) injection 32 millicurie  :  Review of Systems:  Out of a complete 14 point review of systems, all are reviewed and negative with the exception of these symptoms as listed below:  Review of Systems  Genitourinary:  Positive for hematuria.  Neurological:        Pt here for CPAP follow up  Pt states at times her AHI   fluctuate nightly  . Husband states the humanity  Fluctuates  also     Objective:  Neurological Exam  Physical Exam Physical Examination:   Vitals:   10/25/21 1304  BP: 125/78  Pulse: 69    General Examination: The patient is a very pleasant 81 y.o. female in no acute distress.  She  appears well-developed and well-nourished and well groomed.   HEENT: Normocephalic, atraumatic, pupils are equal, round and reactive to light, extraocular tracking is preserved, face is symmetric with normal facial animation.  She does have mild intermittent dysarthria and word finding difficulty, both appear to be stable.  Airway examination reveals mild mouth dryness, small airway entry, redundant soft palate, tonsils on the smaller side, tongue protrudes centrally and palate elevates symmetrically.  No carotid bruits.   Chest: Clear to auscultation without wheezing, rhonchi or crackles noted.   Heart: S1+S2+0, slightly irregular.  No murmurs noted.     Abdomen: Soft, non-tender and non-distended.   Extremities: There is trace edema in the distal lower extremities bilaterally.   Skin: Warm and dry without trophic changes noted.   Musculoskeletal: exam reveals no obvious joint deformities.     Neurologically: Mental status: The patient is awake, alert and oriented in all 4 spheres. Her immediate and remote memory, attention, language skills and fund of knowledge are appropriate. There is no evidence of aphasia, agnosia, apraxia or anomia. Speech is clear with normal prosody and enunciation. Thought process is linear. Mood is normal and affect is normal. Cranial nerves II - XII are as described above under HEENT exam. Motor exam: Normal bulk, strength and tone is noted. There is no obvious tremor. Fine motor skills and coordination: Mildly impaired in the upper extremities. Cerebellar testing: No dysmetria or intention tremor. There is no truncal or gait ataxia. Sensory exam: intact to light touch in the upper and lower extremities. Gait, station and balance: She stands without major difficulty, she stands wide-based.  She has a rolling walker which she maneuvers well.   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  She is fully compliant with treatment and commended for it.  Apnea/events scores are generally good, leak very low lately, she established treatment with a new mask which is an under the nose style fullface mask from [employer].  Her baseline sleep study from 03/12/2020 showed an AHI of 44.6/h, REM AHI of 52.8/h, O2 nadir 86%.  She had a subsequent titration study on 06/22/2020.  This was a difficult titration, she had significant central apneas which were difficult to control, did not respond to standard CPAP or BiPAP therapy and also not to BiPAP ST.  Eventually, she had improvement in her complex sleep disordered breathing with ASV.  She started treatment with ASV at the end of March 2022.  She continues to be fully compliant.  She is advised at this point to follow-up to see Frann Rider, nurse practitioner in 1 year in sleep clinic.  She is advised to continue using her ASV with full compliance.  Average usage is over 8 hours and her average AHI read around 6/h.  Her EPAP is currently at 8 cm.  She is up-to-date with her supplies. I answered all their questions today and the patient and her husband were in agreement.   I spent 30 minutes in total face-to-face time and in reviewing records during pre-charting, more than 50% of  which was spent in counseling and coordination of care, reviewing test results, reviewing medications and treatment regimen and/or in discussing or reviewing the diagnosis of OSA, the prognosis and treatment options. Pertinent laboratory and imaging  test results that were available during this visit with the patient were reviewed by me and considered in my medical decision making (see chart for details).

## 2021-11-17 DIAGNOSIS — H401134 Primary open-angle glaucoma, bilateral, indeterminate stage: Secondary | ICD-10-CM | POA: Diagnosis not present

## 2021-11-21 ENCOUNTER — Other Ambulatory Visit: Payer: Self-pay | Admitting: Cardiovascular Disease

## 2021-11-21 DIAGNOSIS — I4819 Other persistent atrial fibrillation: Secondary | ICD-10-CM

## 2021-12-13 DIAGNOSIS — H401134 Primary open-angle glaucoma, bilateral, indeterminate stage: Secondary | ICD-10-CM | POA: Diagnosis not present

## 2021-12-30 ENCOUNTER — Ambulatory Visit (INDEPENDENT_AMBULATORY_CARE_PROVIDER_SITE_OTHER): Payer: Medicare Other

## 2021-12-30 DIAGNOSIS — I4821 Permanent atrial fibrillation: Secondary | ICD-10-CM

## 2021-12-30 LAB — CUP PACEART REMOTE DEVICE CHECK
Battery Remaining Longevity: 132 mo
Battery Voltage: 3.02 V
Brady Statistic AP VP Percent: 0 %
Brady Statistic AP VS Percent: 0 %
Brady Statistic AS VP Percent: 99.98 %
Brady Statistic AS VS Percent: 0.02 %
Brady Statistic RA Percent Paced: 0 %
Brady Statistic RV Percent Paced: 99.98 %
Date Time Interrogation Session: 20230907045916
Implantable Lead Implant Date: 20211206
Implantable Lead Implant Date: 20211206
Implantable Lead Location: 753858
Implantable Lead Location: 753860
Implantable Lead Model: 3830
Implantable Lead Model: 5076
Implantable Pulse Generator Implant Date: 20211206
Lead Channel Impedance Value: 323 Ohm
Lead Channel Impedance Value: 380 Ohm
Lead Channel Impedance Value: 380 Ohm
Lead Channel Impedance Value: 494 Ohm
Lead Channel Pacing Threshold Amplitude: 1 V
Lead Channel Pacing Threshold Pulse Width: 0.4 ms
Lead Channel Sensing Intrinsic Amplitude: 5.875 mV
Lead Channel Sensing Intrinsic Amplitude: 5.875 mV
Lead Channel Sensing Intrinsic Amplitude: 8.375 mV
Lead Channel Sensing Intrinsic Amplitude: 8.375 mV
Lead Channel Setting Pacing Amplitude: 2 V
Lead Channel Setting Pacing Pulse Width: 0.4 ms
Lead Channel Setting Sensing Sensitivity: 2 mV

## 2022-01-13 ENCOUNTER — Ambulatory Visit (INDEPENDENT_AMBULATORY_CARE_PROVIDER_SITE_OTHER): Payer: Medicare Other | Admitting: Family Medicine

## 2022-01-13 ENCOUNTER — Encounter: Payer: Self-pay | Admitting: Family Medicine

## 2022-01-13 VITALS — BP 130/86 | HR 76 | Temp 97.6°F | Wt 109.0 lb

## 2022-01-13 DIAGNOSIS — I639 Cerebral infarction, unspecified: Secondary | ICD-10-CM | POA: Diagnosis not present

## 2022-01-13 DIAGNOSIS — R319 Hematuria, unspecified: Secondary | ICD-10-CM

## 2022-01-13 DIAGNOSIS — I480 Paroxysmal atrial fibrillation: Secondary | ICD-10-CM

## 2022-01-13 DIAGNOSIS — I10 Essential (primary) hypertension: Secondary | ICD-10-CM

## 2022-01-13 DIAGNOSIS — N390 Urinary tract infection, site not specified: Secondary | ICD-10-CM

## 2022-01-13 LAB — POC URINALSYSI DIPSTICK (AUTOMATED)
Bilirubin, UA: NEGATIVE
Glucose, UA: NEGATIVE
Ketones, UA: NEGATIVE
Leukocytes, UA: NEGATIVE
Nitrite, UA: NEGATIVE
Protein, UA: POSITIVE — AB
Spec Grav, UA: 1.02 (ref 1.010–1.025)
Urobilinogen, UA: 0.2 E.U./dL
pH, UA: 5.5 (ref 5.0–8.0)

## 2022-01-13 MED ORDER — LOSARTAN POTASSIUM 50 MG PO TABS
100.0000 mg | ORAL_TABLET | Freq: Every day | ORAL | 1 refills | Status: DC
Start: 2022-01-13 — End: 2022-02-01

## 2022-01-13 MED ORDER — NITROFURANTOIN MONOHYD MACRO 100 MG PO CAPS: 100.0000 mg | ORAL_CAPSULE | Freq: Two times a day (BID) | ORAL | 0 refills | Status: DC

## 2022-01-13 NOTE — Progress Notes (Signed)
   Subjective:    Patient ID: Mary Mercado, female    DOB: [birth year], 81 y.o.   MRN: [id number]  HPI Here with her husband for several issues. First she has had 3 days of cold chills, burning on urination, and pressure to urinate. No fever or nausea. She drinks lots of water and a glass of cranberry juice every day. Second they are concerned about her BP. She saw Dr. Acie Fredrickson in May and he added Amlodipine 2.5 mg daily to her regimen. Since then her BP is mostly in the 130s to140s over 80s, but every few days she will get spikes to the 150s over 90s. No headache or chest pain or SOB.    Review of Systems  Constitutional: Negative.   Respiratory: Negative.    Cardiovascular: Negative.   Genitourinary:  Positive for dysuria and urgency. Negative for flank pain and hematuria.       Objective:   Physical Exam Constitutional:      Appearance: Normal appearance. She is not ill-appearing.  Cardiovascular:     Rate and Rhythm: Normal rate. Rhythm irregular.     Pulses: Normal pulses.     Heart sounds: Normal heart sounds.  Pulmonary:     Effort: Pulmonary effort is normal.     Breath sounds: Normal breath sounds.  Abdominal:     General: Abdomen is flat. Bowel sounds are normal. There is no distension.     Palpations: Abdomen is soft.     Tenderness: There is no right CVA tenderness, left CVA tenderness, guarding or rebound.     Comments: Mild lower abdominal tenderness   Neurological:     Mental Status: She is alert.           Assessment & Plan:  She has a UTI, and we will treat this with 7 days of Macrobid. Culture the urine sample. Also we will increase the Losartan to 2 pills (100 mg) daily for the BP. She will follow up Cardiology on 02-22-22.  [name], MD

## 2022-01-14 LAB — URINE CULTURE
MICRO NUMBER:: 13950151
Result:: NO GROWTH
SPECIMEN QUALITY:: ADEQUATE

## 2022-01-15 NOTE — Progress Notes (Signed)
Remote pacemaker transmission.   

## 2022-01-31 DIAGNOSIS — H401134 Primary open-angle glaucoma, bilateral, indeterminate stage: Secondary | ICD-10-CM | POA: Diagnosis not present

## 2022-02-01 ENCOUNTER — Other Ambulatory Visit: Payer: Self-pay

## 2022-02-01 ENCOUNTER — Telehealth: Payer: Self-pay | Admitting: Family Medicine

## 2022-02-01 DIAGNOSIS — I10 Essential (primary) hypertension: Secondary | ICD-10-CM

## 2022-02-01 MED ORDER — LOSARTAN POTASSIUM 50 MG PO TABS: 100.0000 mg | ORAL_TABLET | Freq: Every day | ORAL | 1 refills | Status: DC

## 2022-02-01 NOTE — Telephone Encounter (Signed)
Pt called to request a refill of the following: losartan (COZAAR) 50 MG tablet  Pt stated that because she is taking 2 a day, she is running very low and will be out of pills very soon.  LOV:  01/13/22  [id number] [place] [facility] [address]:  [phone] Fax:  [phone]

## 2022-02-01 NOTE — Telephone Encounter (Signed)
[facility] [address].

## 2022-02-17 DIAGNOSIS — H5213 Myopia, bilateral: Secondary | ICD-10-CM | POA: Diagnosis not present

## 2022-02-17 DIAGNOSIS — H2511 Age-related nuclear cataract, right eye: Secondary | ICD-10-CM | POA: Diagnosis not present

## 2022-02-21 NOTE — Progress Notes (Addendum)
Cardiology Office Note:    Date:  02/22/2022   ID:  Sunday Spillers Motsinger [place], [place] [birth year], MRN [id number]  PCP:  [name], MD  [facility] Cardiologist:  Mertie Moores, MD     Referring MD: [name], MD   Chief Complaint:  F/u for AFib, HTN    Patient Profile: Persistent atrial fibrillation  Hx of prior DCCVs DCCV c/b CVA next day (2021) Event monitor 07/2018: NSR, occasional PVCs, nonsustained atrial tachycardia, frequent episodes atrial fibrillation Echo 3/22: EF 60-65, no RWMA, normal RVSF, normal PASP (RVSP 28.4), severe BAE, trivial MR, moderate to severe TR, trivial AI, RAP 3 TEE 05/2018: EF 60-65, no RWMA, normal RVSF, severe BAE, trivial effusion, moderate TV prolapse with moderate severe TR, mild AI, mild MR, negative bubble study S/p AV node ablation  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 to Pradaxa  Hypertension  Hyperlipidemia  Carotid artery disease  [place] 4/21: bilat ICA 1-39   Cardiac Studies & Procedures     STRESS TESTS  MYOCARDIAL PERFUSION IMAGING 01/09/2020  Narrative Please see documentation in chart; this is a non-gated rest-only study. Patient had rest images completed, which are without perfusion defects. Prior to acquisition of stress images, patient had event concerning for acute stroke and was urgently transported to the hospital via EMS.   ECHOCARDIOGRAM  ECHOCARDIOGRAM COMPLETE 06/29/2020  Narrative ECHOCARDIOGRAM REPORT    Patient Name:   KALIA VAHEY SEGBTDVV Date of Exam: 06/29/2020 Medical Rec #:  [id number]                 Height:       66.0 in Accession #:    [id number]                Weight:       128.5 lb Date of Birth:  [birth year]                 BSA:          1.657 m Patient Age:    81 years                  BP:           123/89 mmHg Patient Gender: F                         HR:           77 bpm. Exam Location:  Inpatient  Procedure: 2D Echo, Cardiac Doppler and  Color Doppler  Indications:    Stroke I63.9  History:        Patient has prior history of Echocardiogram examinations, most recent 01/10/2020. Pacemaker, Stroke; Risk Factors:Hypertension, Dyslipidemia and Non-Smoker.  Sonographer:    Vickie Epley RDCS Referring Phys: [id number] Mellette XU  IMPRESSIONS   1. Left ventricular ejection fraction, by estimation, is 60 to 65%. The left ventricle has normal function. The left ventricle has no regional wall motion abnormalities. Left ventricular diastolic parameters are indeterminate. 2. Right ventricular systolic function is normal. The right ventricular size is mildly enlarged. There is normal pulmonary artery systolic pressure. The estimated right ventricular systolic pressure is 38.1 mmHg. 3. Left atrial size was severely dilated. 4. Right atrial size was severely dilated. 5. The mitral valve is degenerative. Trivial mitral valve regurgitation. 6. 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 7. The aortic  valve is tricuspid. Aortic valve regurgitation is trivial. No aortic stenosis is present. 8. The inferior vena cava is normal in size with <50% respiratory variability, suggesting right atrial pressure of 8 mmHg.  Comparison(s): No significant change from prior study.  FINDINGS Left Ventricle: Left ventricular ejection fraction, by estimation, is 60 to 65%. The left ventricle has normal function. The left ventricle has no regional wall motion abnormalities. The left ventricular internal cavity size was normal in size. There is no left ventricular hypertrophy. Left ventricular diastolic parameters are indeterminate.  Right Ventricle: The right ventricular size is mildly enlarged. No increase in right ventricular wall thickness. Right ventricular systolic function is normal. There is normal pulmonary artery systolic pressure. The tricuspid regurgitant velocity is  2.26 m/s, and with an assumed right atrial pressure of 8 mmHg, the estimated right ventricular systolic pressure is 13.2 mmHg.  Left Atrium: Left atrial size was severely dilated.  Right Atrium: Right atrial size was severely dilated.  Pericardium: There is no evidence of pericardial effusion.  Mitral Valve: The mitral valve is degenerative in appearance. There is mild thickening of the mitral valve leaflet(s). There is mild calcification of the mitral valve leaflet(s). Mild mitral annular calcification. Trivial mitral valve regurgitation.  Tricuspid Valve: The tricuspid valve is normal in structure. 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  Aortic Valve: The aortic valve is tricuspid. Aortic valve regurgitation is trivial. No aortic stenosis is present.  Pulmonic Valve: The pulmonic valve was normal in structure. Pulmonic valve regurgitation is trivial.  Aorta: The aortic root is normal in size and structure.  Venous: The inferior vena cava is normal in size with less than 50% respiratory variability, suggesting right atrial pressure of 8 mmHg.  IAS/Shunts: No atrial level shunt detected by color flow Doppler.   LEFT VENTRICLE PLAX 2D LVIDd:         4.20 cm LVIDs:         2.90 cm LV PW:         0.60 cm LV IVS:        0.60 cm LVOT diam:     2.10 cm LV SV:         40 LV SV Index:   24 LVOT Area:     3.46 cm  LV Volumes (MOD) LV vol d, MOD A2C: 63.3 ml LV vol d, MOD A4C: 53.4 ml LV vol s, MOD A2C: 26.2 ml LV vol s, MOD A4C: 26.1 ml LV SV MOD A2C:     37.1 ml LV SV MOD A4C:     53.4 ml LV SV MOD BP:      34.3 ml  RIGHT VENTRICLE RV S prime:     10.00 cm/s TAPSE (M-mode): 1.7 cm  LEFT ATRIUM           Index       RIGHT ATRIUM           Index LA diam:      3.90 cm 2.35 cm/m  RA Area:     27.90 cm LA Vol (A2C): 50.1 ml 30.23 ml/m RA Volume:   93.40 ml  56.36 ml/m LA Vol (A4C):  63.9 ml 38.56 ml/m AORTIC VALVE LVOT Vmax:   69.00 cm/s LVOT Vmean:  44.800 cm/s LVOT VTI:    0.116 m  AORTA Ao Root diam: 3.30 cm  TRICUSPID VALVE TR Peak grad:   20.4 mmHg TR Vmax:  226.00 cm/s  SHUNTS Systemic VTI:  0.12 m Systemic Diam: 2.10 cm  Gwyndolyn Kaufman MD Electronically signed by Gwyndolyn Kaufman MD Signature Date/Time: 06/29/2020/2:01:29 PM    Final   TEE  ECHO TEE 06/05/2018  Narrative TRANSESOPHOGEAL ECHO REPORT    Patient Name:   JASHA HODZIC Date of Exam: 06/05/2018 Medical Rec #:  [id number]       Height:       65.0 in Accession #:    [id number]      Weight:       142.2 lb Date of Birth:  1940/05/24       BSA:          1.71 m Patient Age:    60 years        BP:           125/74 mmHg Patient Gender: F               HR:           64 bpm. Exam Location:  Inpatient   Procedure: Transesophageal Echo, Cardiac Doppler and Color Doppler  Indications:    CVA  History:        Patient has prior history of Echocardiogram examinations, most recent 06/01/2018.  Sonographer:    Philipp Deputy Referring Phys: [id number] [place] [facility]    PROCEDURE: Image quality was good. The patient developed no complications during the procedure.  IMPRESSIONS   1. The left ventricle has normal systolic function of 26-33%. The cavity size was normal. No evidence of left ventricular regional wall motion abnormalities. 2. No evidence of left ventricular regional wall motion abnormalities. 3. The right ventricle has normal systolic function. The cavity was normal. 4. Left atrial size was severely dilated. 5. Right atrial size was severely dilated. 6. Trivial pericardial effusion. 7. The mitral valve is normal in structure. There is mild thickening. 8. 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 10. The aortic valve is tricuspid There is mild thickening of the aortic  valve. 11. The pulmonic valve was grossly normal. Pulmonic valve regurgitation is mild by color flow Doppler. 12. Normal LV function; severe LAE; no LAA thrombus; severe RAE; sclerotic aortic valve with mild AI; mild MR; TV prolapse with moderate to severe TR; negative saline microcavitation study.  FINDINGS Left Ventricle: The left ventricle has normal systolic function of 35-45%. The cavity size was normal. No evidence of left ventricular regional wall motion abnormalities. Right Ventricle: The right ventricle has normal systolic function. The cavity was normal. Left Atrium: Left atrial size was severely dilated. Right Atrium: Right atrial size was severely dilated. Interatrial Septum: Saline contrast bubble study was negative, with no evidence of any interatrial shunt. Pericardium: Trivial pericardial effusion is present. Mitral Valve: The mitral valve is normal in structure. There is mild thickening. Mitral valve regurgitation is mild by color flow Doppler. 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 The tricuspid valve is mildly thickened. There is moderate prolapse of the tricuspid. Aortic Valve: The aortic valve is tricuspid There is mild thickening of the aortic valve. Mild AI. Pulmonic Valve: The pulmonic valve was grossly normal. Pulmonic valve regurgitation is mild by color flow Doppler. Aorta: There is evidence of plaque in the descending aorta.  Kirk Ruths MD Electronically signed by Kirk Ruths MD Signature Date/Time: 06/05/2018/10:03:39 AM    Final   MONITORS  CARDIAC EVENT MONITOR 08/03/2018  Narrative  Sinhus bradycardia and normal sinus rhythm. The averate heart rate was 66.7bpm with lowest heart rate 54bpm.  Occasional PVCs  Nonsustained atrial tachycardia up to 18 beats in a row.  Frequent episodes of sustained atrial fibrillation with RVR up to 186bpm.  Wide complex  tachycardia up to 4 beats at a time always in atrial fibrillation and likely aberratntly conducted.            History of Present Illness:   Lela Murfin Puccini is a 81 y.o. female with the above problem list.  She was last seen by Dr. Acie Fredrickson in May 2023.  Amlodipine was added for uncontrolled blood pressure.  She returns for follow-up. She is here today with her husband. She brings in a list of BPs. She has had several readings at home that are optimal. Yesterday and today (at home) BP was 140s/90s. She has not had chest pain or significant shortness of breath. She notes difficulty with swallowing. She sees neurology in a couple of weeks. I asked her to discuss this with neuro in case she needs to be re-evaluated by speech therapy. She continues to note a "quivering" in her chest that she attributes to atrial fibrillation. I reviewed her EP notes. It looks like her lower rate limit was adjusted several times for this and her rate response was ultimately turned off. She sleeps with CPAP. She has not had syncope.        Past Medical History:  Diagnosis Date   Allergy    CVA (cerebral vascular accident) ([place]) 05/2018, 01/09/20   Hyperlipidemia LDL goal <70    Hypertension    Palpitations    Vertigo, benign positional    Current Medications: Current Meds  Medication Sig   amLODipine (NORVASC) 2.5 MG tablet Take 1 tablet (2.5 mg total) by mouth daily.   atorvastatin (LIPITOR) 80 MG tablet TAKE 1 TABLET BY MOUTH DAILY AT 6 PM.   brimonidine (ALPHAGAN) 0.2 % ophthalmic solution Place 1 drop into both eyes 2 times daily.   dorzolamide (TRUSOPT) 2 % ophthalmic solution 1 drop 2 (two) times daily.   levETIRAcetam (KEPPRA) 500 MG tablet Take 1 tablet (500 mg total) by mouth 2 (two) times daily.   losartan (COZAAR) 50 MG tablet Take 2 tablets (100 mg total) by mouth daily.   metoprolol tartrate (LOPRESSOR) 25 MG tablet TAKE 1/2 TABLET BY MOUTH EVERY DAY AT BEDTIME   PRADAXA 150 MG CAPS capsule  Take 1 capsule (150 mg total) by mouth 2 (two) times daily.    Allergies:   Duloxetine and Acetaminophen   Social History   Tobacco Use   Smoking status: Never   Smokeless tobacco: Never  Vaping Use   Vaping Use: Never used  Substance Use Topics   Alcohol use: Never   Drug use: Never    Family Hx: The patient's family history includes Colon cancer in her father; Diabetes in her mother; Heart attack in her mother; Heart attack (age of onset: 90) in her brother. There is no history of Sleep apnea.  Review of Systems  Gastrointestinal:  Positive for diarrhea.     EKGs/Labs/Other Test Reviewed:    EKG:  EKG is not ordered today.   Recent Labs: 03/04/2021: Hemoglobin 14.3; Platelets 208.0; TSH 3.43 09/21/2021: ALT 22; BUN 14; Creatinine, Ser 0.88; Potassium 4.7; Sodium 143   Recent Lipid Panel Recent Labs    03/04/21 0902 09/21/21 1600  CHOL 162 161  TRIG 69.0 49  HDL 71.80 84  VLDL 13.8  --   LDLCALC 77 67     Risk Assessment/Calculations/Metrics:    CHA2DS2-VASc Score = 7   This indicates a 11.2% annual risk of stroke. The patient's score is based upon: CHF History: 0 HTN History: 1 Diabetes History: 0 Stroke History: 2 Vascular Disease History: 1 Age Score: 2 Gender Score: 1             Physical Exam:    VS:  BP 120/88   Pulse 68   Ht '5\' 5"'$  (1.651 m)   Wt 112 lb 3.2 oz (50.9 kg)   SpO2 90%   BMI 18.67 kg/m     Wt Readings from Last 3 Encounters:  02/22/22 112 lb 3.2 oz (50.9 kg)  01/13/22 109 lb (49.4 kg)  10/25/21 111 lb 3.2 oz (50.4 kg)    Constitutional:      Appearance: Healthy appearance. Not in distress.  Neck:     Vascular: JVD normal.  Pulmonary:     Effort: Pulmonary effort is normal.     Breath sounds: No wheezing. No rales.  Cardiovascular:     Normal rate. Regular rhythm. Normal S1. Normal S2.      Murmurs: There is no murmur.  Edema:    Peripheral edema present.    Ankle: bilateral trace edema of the ankle. Abdominal:      Palpations: Abdomen is soft.  Skin:    General: Skin is warm and dry.  Neurological:     Mental Status: Alert and oriented to person, place and time.         ASSESSMENT & PLAN:   Essential hypertension Fair control.  She has had some optimal blood pressures at home.  I have asked her to continue to monitor blood pressure and send readings in for review through MyChart.  Continue Amlodipine 2.5 mg daily, losartan 100 mg daily, metoprolol tartrate 12.5 mg daily.  Paroxysmal atrial fibrillation (HCC) She is tolerating anticoagulation.  Recent hemoglobin and creatinine stable.  Continue Pradaxa 150 mg twice daily.  Pacemaker - MDT She continues to note a quivering sensation.  As noted, her pacer has been adjusted several times.  Her rate response was ultimately turned off.  Its been a year since she has seen EP.  I will set her up for follow-up with the EP to see if there are any other settings adjustments that can be made.  Hemiplegia and hemiparesis following cerebral infarction affecting left non-dominant side [facility]) She notes difficulty with swallowing that has gotten worse recently.  I have asked her to discuss this with neurology when she is seen for follow-up to see if she needs reevaluation with speech therapy.  Preoperative cardiovascular examination She notes that she needs cataract surgery soon.  This is considered a low risk procedure.  She may proceed at acceptable risk.  She should remain on anticoagulation without interruption.           Dispo:  Return in about 6 months (around 08/23/2022) for Routine Follow Up, w/ Dr. Acie Fredrickson.   Medication Adjustments/Labs and Tests Ordered: Current medicines are reviewed at length with the patient today.  Concerns regarding medicines are outlined above.  Tests Ordered: No orders of the defined types were placed in this encounter.  Medication Changes: No orders of the defined types were placed in this encounter.  Signed, Richardson Dopp,  PA-C  02/22/2022 1:37 PM    [address], [place], [place]  [address] Phone: [phone]; Fax: [phone]  

## 2022-02-22 ENCOUNTER — Ambulatory Visit: Payer: Medicare Other | Attending: Physician Assistant | Admitting: Physician Assistant

## 2022-02-22 ENCOUNTER — Encounter: Payer: Self-pay | Admitting: Physician Assistant

## 2022-02-22 VITALS — BP 120/88 | HR 68 | Ht 65.0 in | Wt 112.2 lb

## 2022-02-22 DIAGNOSIS — I480 Paroxysmal atrial fibrillation: Secondary | ICD-10-CM

## 2022-02-22 DIAGNOSIS — Z01810 Encounter for preprocedural cardiovascular examination: Secondary | ICD-10-CM

## 2022-02-22 DIAGNOSIS — I69354 Hemiplegia and hemiparesis following cerebral infarction affecting left non-dominant side: Secondary | ICD-10-CM | POA: Diagnosis not present

## 2022-02-22 DIAGNOSIS — I639 Cerebral infarction, unspecified: Secondary | ICD-10-CM | POA: Diagnosis not present

## 2022-02-22 DIAGNOSIS — I10 Essential (primary) hypertension: Secondary | ICD-10-CM | POA: Diagnosis not present

## 2022-02-22 DIAGNOSIS — Z950 Presence of cardiac pacemaker: Secondary | ICD-10-CM

## 2022-02-22 NOTE — Assessment & Plan Note (Signed)
She is tolerating anticoagulation.  Recent hemoglobin and creatinine stable.  Continue Pradaxa 150 mg twice daily.

## 2022-02-22 NOTE — Assessment & Plan Note (Signed)
She notes difficulty with swallowing that has gotten worse recently.  I have asked her to discuss this with neurology when she is seen for follow-up to see if she needs reevaluation with speech therapy.

## 2022-02-22 NOTE — Patient Instructions (Signed)
Medication Instructions:  Your physician recommends that you continue on your current medications as directed. Please refer to the Current Medication list given to you today.  *If you need a refill on your cardiac medications before your next appointment, please call your pharmacy*   Lab Work: None ordered  If you have labs (blood work) drawn today and your tests are completely normal, you will receive your results only by: [employer] (if you have MyChart) OR A paper copy in the mail If you have any lab test that is abnormal or we need to change your treatment, we will call you to review the results.   Testing/Procedures: None ordered  Your physician recommends that you schedule a follow-up appointment in: With Dr. Caryl Comes or Donnajean Lopes, for pacer check    Follow-Up: At [facility], you and your health needs are our priority.  As part of our continuing mission to provide you with exceptional heart care, we have created designated Provider Care Teams.  These Care Teams include your primary Cardiologist (physician) and Advanced Practice Providers (APPs -  Physician Assistants and Nurse Practitioners) who all work together to provide you with the care you need, when you need it.  We recommend signing up for the patient portal called "MyChart".  Sign up information is provided on this After Visit Summary.  MyChart is used to connect with patients for Virtual Visits (Telemedicine).  Patients are able to view lab/test results, encounter notes, upcoming appointments, etc.  Non-urgent messages can be sent to your provider as well.   To learn more about what you can do with MyChart, go to [email or web address].    Your next appointment:   6 month(s)  The format for your next appointment:   In Person  Provider:   Mertie Moores, MD     Other Instructions Your physician has requested that you regularly monitor and record your blood pressure readings at home. Please use the  same machine at the same time of day to check your readings and record them to bring to your follow-up visit.   Please monitor blood pressures and keep a log of your readings for 2 weeks then send a message via mychart with those.   Make sure to check 2 hours after your medications.    AVOID these things for 30 minutes before checking your blood pressure: No Drinking caffeine. No Drinking alcohol. No Eating. No Smoking. No Exercising.   Five minutes before checking your blood pressure: Pee. Sit in a dining chair. Avoid sitting in a soft couch or armchair. Be quiet. Do not talk   Important Information About Sugar

## 2022-02-22 NOTE — Addendum Note (Signed)
Addended byKathlen Mody, Nicki Reaper T on: 02/22/2022 01:38 PM   Modules accepted: Level of Service

## 2022-02-22 NOTE — Assessment & Plan Note (Addendum)
Fair control.  She has had some optimal blood pressures at home.  I have asked her to continue to monitor blood pressure and send readings in for review through MyChart.  Continue Amlodipine 2.5 mg daily, losartan 100 mg daily, metoprolol tartrate 12.5 mg daily.

## 2022-02-22 NOTE — Assessment & Plan Note (Signed)
She notes that she needs cataract surgery soon.  This is considered a low risk procedure.  She may proceed at acceptable risk.  She should remain on anticoagulation without interruption.

## 2022-02-22 NOTE — Assessment & Plan Note (Signed)
She continues to note a quivering sensation.  As noted, her pacer has been adjusted several times.  Her rate response was ultimately turned off.  Its been a year since she has seen EP.  I will set her up for follow-up with the EP to see if there are any other settings adjustments that can be made.

## 2022-03-07 ENCOUNTER — Ambulatory Visit (INDEPENDENT_AMBULATORY_CARE_PROVIDER_SITE_OTHER): Payer: Medicare Other | Admitting: Family Medicine

## 2022-03-07 ENCOUNTER — Encounter: Payer: Self-pay | Admitting: Family Medicine

## 2022-03-07 VITALS — BP 120/82 | HR 68 | Temp 97.5°F | Ht 65.0 in | Wt 110.1 lb

## 2022-03-07 DIAGNOSIS — I480 Paroxysmal atrial fibrillation: Secondary | ICD-10-CM

## 2022-03-07 DIAGNOSIS — I63511 Cerebral infarction due to unspecified occlusion or stenosis of right middle cerebral artery: Secondary | ICD-10-CM | POA: Diagnosis not present

## 2022-03-07 DIAGNOSIS — I69354 Hemiplegia and hemiparesis following cerebral infarction affecting left non-dominant side: Secondary | ICD-10-CM | POA: Diagnosis not present

## 2022-03-07 DIAGNOSIS — R7303 Prediabetes: Secondary | ICD-10-CM

## 2022-03-07 DIAGNOSIS — Z950 Presence of cardiac pacemaker: Secondary | ICD-10-CM | POA: Diagnosis not present

## 2022-03-07 DIAGNOSIS — E785 Hyperlipidemia, unspecified: Secondary | ICD-10-CM

## 2022-03-07 DIAGNOSIS — I10 Essential (primary) hypertension: Secondary | ICD-10-CM | POA: Diagnosis not present

## 2022-03-07 DIAGNOSIS — I471 Supraventricular tachycardia, unspecified: Secondary | ICD-10-CM | POA: Diagnosis not present

## 2022-03-07 DIAGNOSIS — R569 Unspecified convulsions: Secondary | ICD-10-CM | POA: Diagnosis not present

## 2022-03-07 DIAGNOSIS — H8110 Benign paroxysmal vertigo, unspecified ear: Secondary | ICD-10-CM

## 2022-03-07 DIAGNOSIS — Z23 Encounter for immunization: Secondary | ICD-10-CM | POA: Diagnosis not present

## 2022-03-07 NOTE — Addendum Note (Signed)
Addended by: Wyvonne Lenz on: 03/07/2022 01:34 PM   Modules accepted: Orders

## 2022-03-07 NOTE — Progress Notes (Signed)
Subjective:    Patient ID: Mary Mercado, female    DOB: June 24, 1940, 81 y.o.   MRN: [id number]  HPI Here to follow up on issues . She has no complaints today. Her BP has been well controlled. No neurologic issues or seizure activities. Her glaucoma is well controlled. She has a cataract in the right eye, and she sill have surgery on this sometime soon.    Review of Systems  Constitutional: Negative.   HENT: Negative.    Eyes:  Positive for visual disturbance.  Respiratory: Negative.    Cardiovascular: Negative.   Gastrointestinal: Negative.   Genitourinary:  Negative for decreased urine volume, difficulty urinating, dyspareunia, dysuria, enuresis, flank pain, frequency, hematuria, pelvic pain and urgency.  Musculoskeletal: Negative.   Skin: Negative.   Neurological: Negative.  Negative for headaches.  Psychiatric/Behavioral: Negative.         Objective:   Physical Exam Constitutional:      General: She is not in acute distress.    Appearance: Normal appearance. She is well-developed.  HENT:     Head: Normocephalic and atraumatic.     Right Ear: External ear normal.     Left Ear: External ear normal.     Nose: Nose normal.     Mouth/Throat:     Pharynx: No oropharyngeal exudate.  Eyes:     General: No scleral icterus.    Conjunctiva/sclera: Conjunctivae normal.     Pupils: Pupils are equal, round, and reactive to light.  Neck:     Thyroid: No thyromegaly.     Vascular: No JVD.  Cardiovascular:     Rate and Rhythm: Normal rate and regular rhythm.     Heart sounds: Normal heart sounds. No murmur heard.    No friction rub. No gallop.  Pulmonary:     Effort: Pulmonary effort is normal. No respiratory distress.     Breath sounds: Normal breath sounds. No wheezing or rales.  Chest:     Chest wall: No tenderness.  Abdominal:     General: Bowel sounds are normal. There is no distension.     Palpations: Abdomen is soft. There is no mass.     Tenderness: There  is no abdominal tenderness. There is no guarding or rebound.  Musculoskeletal:        General: No tenderness. Normal range of motion.     Cervical back: Normal range of motion and neck supple.  Lymphadenopathy:     Cervical: No cervical adenopathy.  Skin:    General: Skin is warm and dry.     Findings: No erythema or rash.  Neurological:     Mental Status: She is alert and oriented to person, place, and time.     Cranial Nerves: No cranial nerve deficit.     Motor: No abnormal muscle tone.     Coordination: Coordination normal.     Deep Tendon Reflexes: Reflexes are normal and symmetric. Reflexes normal.  Psychiatric:        Behavior: Behavior normal.        Thought Content: Thought content normal.        Judgment: Judgment normal.           Assessment & Plan:  She is doing well after her hx of strokes. Her pacemaker is working well. Her HTN is stable. No seizure activity. She will have the cataract surgery as above. We will get fasting labs to check lipids, an A1c, etc. We spent a total of ( 34  )  minutes reviewing records and discussing these issues.  [name], MD

## 2022-03-08 NOTE — Progress Notes (Unsigned)
[facility] [address] [place]. [place] [address] [phone]       OFFICE FOLLOW-UP NOTE  [name] Date of Birth:  [birth year] Medical Record Number:  [id number]   Reason for visit: Stroke follow-up  Chief complaint: No chief complaint on file.     HPI:   Mary Mercado is a 81 y.o. 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  Returns today for routine follow-up.   Update 03/09/2022 JM: Patient returns for 62-monthstroke follow-up accompanied by her husband.  Overall stable without new stroke/TIA symptoms.  Residual deficits stable.  Remains on Keppra 500 mg twice daily, denies any seizure activity.  Remains on Pradaxa and atorvastatin.  Blood pressure well controlled.     History provided for reference purposes only Update 09/01/2021 JM: Previously seen 6 months ago accompanied by her husband.  Overall stable without new stroke/TIA symptoms.  Residual deficits stable without worsening. Continues to experience significant anxiety - has not further discussed with PCP. She has been participating in yoga twice weekly which she believes has been helpful.  Compliant on Pradaxa and atorvastatin, denies side effects.  Blood pressure today 149/87.  Occasionally monitors  at home and typically stable.  No additional witnessed seizure activity.  Attempted use of Keppra XR due to concerning symptoms on IR formulation, she decided to return back to IR formulation due to concern of XR capsule not dissolving (found in BM) and c/o intermittent numbness since change to XR.  She is currently on Keppra 500 mg twice daily without specific side  effects.  Continues to follow with Dr. ARexene Albertsfor OSA on BiPAP reporting nightly usage.  No new concerns at this time.  Update 03/10/2021 JM: Returns for 674-monthtroke and seizure follow-up accompanied by her husband  Stable since prior visit -denies new stroke/TIA symptoms or seizure activity Reports continued imbalance and left hand and foot occasional numbness which has been present since her prior strokes.  Continues to ambulate with RW -denies any recent falls. She does report episodes typically occurring midday and evening consisting of feeling shaky/trembling, and just feeling generally "bad" and will improve after sitting for a minute.  No other associated symptoms such as increased weakness, altered awareness/confusion, loss of consciousness, headache or speech changes.  At first, she reported this has been persistent since her stroke but then mentioned it has become more frequent since her seizure. She does believe it usually occurs with increased fatigue or anxiety. This was previously discussed at prior visit almost 2 years ago (see OV 04/2019).  She becomes very anxious with increased L>R UE tremor while discussing this.   Compliant on Keppra 500 mg twice daily  Compliant on Pradaxa and atorvastatin 80 mg daily -denies side effects Blood pressure today 151/84 - occasionally monitors at home - has been running 140s/90s - cardiology is aware  Continues to follow with Dr. AtRexene Albertsor sleep apnea monitoring and management  No further concerns at this time   Update 09/07/2020 Dr. SeLeonie ManPatient is seen today accompanied by her husband following recent hospital admission for seizures in March 2022.  She is seen today for a new problem of seizures.  Patient developed sudden onset of speech  difficulties on 06/28/2020 with left upper extremity twitching's.  During transport by EMS she also complained of right-sided headache.  Upon arrival in the ER her twitchings resolved after treatment with IV  lorazepam except for mild left thumb twitching.  She was loaded with IV Keppra and the involuntary movements stopped.  EEG showed focal right-sided slowing and MRI scan was negative for acute infarct LDL cholesterol was 71 mg percent and home statin dose was continued.  Hemoglobin A1c was 6.4.  Patient was on Pradaxa 150 mg twice daily which was continued.  Patient states she is tolerating Keppra well without any dizziness or other side effects.  She has had no recurrent seizures or twitching's.  She has had no recurrent stroke or TIA symptoms either.  She is tolerating Pradaxa well without bruising or bleeding.  She is seeing Dr. Rexene Mercado for her sleep apnea and has been using her CPAP every night.  Blood pressure is normally well controlled at home the today it is elevated in office at 150/86.  She has no new complaints.  She has no prior history of seizures, significant head injury with loss of consciousness.  But she does have history of multiple strokes in the past2/2020 admitted for left-sided numbness weakness, status post TPA, MRI showed right frontal parietal infarct.  CTA head neck showed right P2, left VA, bilateral ICA siphon severe stenosis.  EF 60 to 65%.  Negative for DVT, TEE negative.  LDL 117, A1c 5.9.  Discharged with DAPT and Lipitor 40 30-day CardioNet monitoring showed A. fib patient started on Eliquis. 12/2019  Admitted for right cerebellar infarct.  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  EF 60 to 65%, LDL 81, A1c 5.9.  Eliquis changed to Pradaxa, continue Lipitor 80.   Update 06/03/2020 JM: [name] returns for 71-monthstroke follow-up.  She has been stable from a stroke standpoint since prior visit without new stroke/TIA symptoms and reports residual gait unsteadiness with imbalance and dysarthria. She continues to use rolling walker and denies any recent falls. She also reports chronic left leg and torso numbness with occasional  burning sensation which is chronic and denies worsening. She has remained on Pradaxa and atorvastatin 80 mg daily without side effects.  Blood pressure today 147/86.  She has routinely followed with cardiology for continued dizziness which she reports is in relation to blood pressure and cardiology currently working on adjustments.  She was evaluated by Dr. ARexene Albertsand was found to have severe sleep apnea. Recommended titration study but patient requested holding off until further recovery from undergoing ablation for persistent A. 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 She is not ready to proceed with titration study. No further concerns at this time.  Initial follow-up 02/03/2020 JM: [facility] was [facility] after an 8-day stay.  Since discharge, she reports residual " whooshy head" sensation, gait unsteadiness and mild dysarthria but reports ongoing improvement.  Recommended outpatient PT/OT/SLP at discharge from CIR but referral was not placed at discharge.  She has continued to do exercises at home as recommended during CIR stay.  Continues to use Rollator walker for ambulation and denies any recent falls.  Denies vertigo or dizziness sensation.  Denies new or worsening stroke/TIA symptoms.  Prior stroke deficit of left-sided paresthesias greatly improving and only experience occasional numbness/tingling.  Remains on Pradaxa without bleeding or bruising.  Remains on atorvastatin 80 mg daily without myalgias.  Blood pressure today 122/76.  Monitors at home which has been stable.  She does report urinary frequency over the past week and is concerned for possible UTI but has been increasing water intake and drinking cranberry juice.  Denies fever, dysuria or hematuria.  Typically, nocturia only on occasion but does report snoring, daytime naps and occasional insomnia.  She has not previously underwent sleep study.  No further concerns at this time.  Stroke admission  01/09/2020 Presented to ER with slurred speech, facial droop vertigo and ataxia while at her cardiologist office the morning of 01/09/2020.  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  Switched anticoagulation to Pradaxa.  HTN stable.  LDL 81 and increase atorvastatin from 40 mg to 80 mg daily.  Other stroke risk factors include advanced age and history of right MCA stroke in 05/2018.  Evaluated by therapy with residual dysarthria, mild memory deficits, vestibular symptoms and imbalance affecting ADLs and mobility therefore discharged to CIR with functional decline.  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 Old infarct R frontoparietal cortex. Small vessel disease. ASPECTS 10.    CTA head & neck no LVO. B ICA bifurcation atherosclerosis, supraclinoid B ICA atherosclerosis. L VA origin moderate to severe stenosis. R P2 moderate stenosis.  MRI  R superior cerebellar territory infarct. Evolution of prior R MCA infarcts from last year. Small vessel disease.  2D Echo EF 60-65%. No source of embolus. LA severely dilated. LDL 81 HgbA1c 5.9 VTE prophylaxis - Lovenox 40 mg sq daily  Eliquis (apixaban) daily prior to admission, now on aspirin 81 mg daily.   Switched to Pradaxa Therapy recommendations:   CIR Disposition:   CIR     Update 08/01/2019 JM: Ms. Herne is a 82 year old female who is being seen today, 08/01/2019, for stroke follow-up with residual left-sided paresthesias and post stroke anxiety accompanied by her husband.  Initiated Cymbalta at prior visit due to ongoing paresthesias and severe anxiety but unfortunately caused hypertension therefore advised to discontinue. Paresthesias have been stable from prior visit without worsening with intermittent symptoms that occur randomly.  She does continue to have occasional balance difficulties and if ambulating or standing  for too long she will start to feel "off".  Referral placed at prior visit to PT but apparently has not been called to schedule initial evaluation.  She continues to experience anxiety which has only been present for stroke but does feel slight improvement since prior visit.  Remains on Eliquis and atorvastatin without side effects.  Blood pressure today 132/74.  No further concerns at this time.  Update 05/02/2019: Ms. Lodico is a 81 year old female who is being seen today for stroke follow-up accompanied by her husband.  Residual deficits left-sided paresthesias consisting of numbness/tingling and occasional burning sensation.  She also endorses episodes of sensation of full body "intermittently shaking" and is usually worsened by stress, anxiety or fatigue.  Usually accompanied by nausea but denies headache or dizziness.  She becomes fearful during those times that she may be having a heart attack.  When questioned regarding anxiety, she does endorse minimal anxiety but after doing GAD-7 test, score of 20/21 showing severe anxiety.  She denies any prior history or family history of depression or anxiety.  She continues on Eliquis without bleeding or bruising.  She continues on atorvastatin without myalgias.  Blood pressure today 140/81.  She denies any additional  episodes of dizziness and continues to follow with cardiology for blood pressure management.  She has recently decreased telmisartan as it is recommended to continue blood pressure between 130-150 due to vertebral artery stenosis.  No further concerns at this time.   Update 11/19/2018 Dr. Leonie Mercado: She is seen today for office follow-up visit following initial video follow-up visit on 08/13/2018.  She is accompanied by her husband.  She states she continues to have left leg paresthesias and numbness.  She has some mild gait and balance difficulties.  At times she noticed that the legs are trembling and she is initiating walk and she has to hold on  something and the feeling goes away.  She has also been started on several new cardiac medications per her ablation for A. fib and feels medication may be the cause for her dizziness.  She is tolerating Eliquis well without bruising or bleeding.  Blood pressures well controlled and today it is 136/79.  She remains on Lipitor which is tolerating well without muscle aches and pains.  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  Initial video visit 08/13/2018 Dr. Leonie Mercado : This is a initial video virtual consultation visit on Ms. Martus who was admitted to [facility] in February 2020 with a stroke.  I have personally obtained history of presenting illness from the patient and her husband and reviewed electronic medical records as well as imaging films in PACS.  She was admitted on 05/31/2018 with sudden onset of left arm and leg weakness and numbness as well as feeling dizzy and had one episode of emesis.  She had CT scan and CT angiogram in the ER which showed no large vessel occlusion she was given IV TPA and admitted to the neurological intensive care unit.  NIH stroke scale on admission was 4.  At baseline modified Rankin score was 0.  Patient had tight blood pressure control.  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  Transthoracic echo showed normal ejection fraction.  Transesophageal echocardiogram showed no cardiac source of embolism or PFO.  LDL cholesterol was elevated at '1 1 7 '$ mg percent.  Hemoglobin A1c was 5.9.  Urine drug screen was negative.  Lower extremity venous Dopplers were negative for DVT.  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  Patient  was started on aspirin 81 and Plavix 75 mg daily for 3 weeks and underwent an outpatient 30-day heart monitor which subsequently showed paroxysmal atrial fibrillation.  She has since then stopped aspirin and Plavix and has been switched to Eliquis.  Patient states she has done well since discharge.  She was initially transferred to inpatient rehab where she stayed for a few weeks and did well.  She is still has left-sided numbness but feels some of the sensation may be coming back now.  She can walk independently without assistance though her husband stays close by.  She has had no falls or injuries.  She is tolerating Eliquis well without bleeding or bruising.  She is also tolerating Lipitor well.  She complains of little bit of jitteriness and tiredness but she blames this likely on her new A. fib medication.  She has no new complaints.  She has no prior history of strokes TIAs.        ROS:   14 system review of systems is positive for those listed in HPI and all other systems negative  PMH:  Past Medical History:  Diagnosis Date   Allergy    CVA (cerebral vascular accident) ([place]) 05/2018, 01/09/20   Hyperlipidemia LDL goal <70    Hypertension    Palpitations    Vertigo, benign positional     Social History:  Social History   Socioeconomic History   Marital status: Married    Spouse name: Engineer, petroleum   Number of children: Not on file   Years of education: 12+   Highest education level: Not on file  Occupational History    Comment: retired  Tobacco Use   Smoking status: Never   Smokeless tobacco: Never  Vaping Use   Vaping Use: Never used  Substance and Sexual Activity   Alcohol use: Never   Drug use: Never   Sexual activity: Not on file  Other Topics Concern   Not on file  Social History Narrative   Lives with husband   caffeine none daily   Right Handed   Social Determinants of Health   Financial Resource Strain: Low Risk  (05/26/2021)   Overall Financial Resource Strain  (CARDIA)    Difficulty of Paying Living Expenses: Not hard at all  Food Insecurity: No Food Insecurity (05/26/2021)   Hunger Vital Sign    Worried About Running Out of Food in the Last Year: Never true    [place] in the Last Year: Never true  Transportation Needs: No Transportation Needs (05/26/2021)   PRAPARE - Hydrologist (Medical): No    Lack of Transportation (Non-Medical): No  Physical Activity: Insufficiently Active (05/26/2021)   Exercise Vital Sign    Days of Exercise per Week: 2 days    Minutes of Exercise per Session: 30 min  Stress: No Stress Concern Present (05/26/2021)   [place]    Feeling of Stress : Not at all  Social Connections: [place] (05/26/2021)   Social Connection and Isolation Panel [NHANES]    Frequency of Communication with Friends and Family: More than three times a week    Frequency of Social Gatherings with Friends and Family: More than three times a week    Attends Religious Services: More than 4 times per year    Active Member of [employer] or Organizations: Yes    Attends Music therapist: More than 4 times per year    Marital Status: Married  Human resources officer Violence: Not At Risk (05/26/2021)   Humiliation, Afraid, Rape, and Kick questionnaire    Fear of Current or Ex-Partner: No    Emotionally Abused: No    Physically Abused: No    Sexually Abused: No    Medications:   Current Outpatient Medications on File Prior to Visit  Medication Sig Dispense Refill   amLODipine (NORVASC) 2.5 MG tablet Take 1 tablet (2.5 mg total) by mouth daily. 90 tablet 3   atorvastatin (LIPITOR) 80 MG tablet TAKE 1 TABLET BY MOUTH DAILY AT 6 PM. 90 tablet 1   brimonidine (ALPHAGAN) 0.2 % ophthalmic solution Place 1 drop into both eyes 2 times daily.     dorzolamide (TRUSOPT) 2 % ophthalmic solution 1 drop 2 (two) times daily.     levETIRAcetam (KEPPRA) 500  MG tablet Take 1 tablet (500  mg total) by mouth 2 (two) times daily. 60 tablet 11   losartan (COZAAR) 50 MG tablet Take 2 tablets (100 mg total) by mouth daily. 180 tablet 1   metoprolol tartrate (LOPRESSOR) 25 MG tablet TAKE 1/2 TABLET BY MOUTH EVERY DAY AT BEDTIME 45 tablet 2   PRADAXA 150 MG CAPS capsule Take 1 capsule (150 mg total) by mouth 2 (two) times daily. 60 capsule 5   Current Facility-Administered Medications on File Prior to Visit  Medication Dose Route Frequency Provider Last Rate Last Admin   regadenoson (LEXISCAN) injection SOLN 0.4 mg  0.4 mg Intravenous Once Buford Dresser, MD       technetium tetrofosmin (TC-MYOVIEW) injection 32 millicurie  32 millicurie Intravenous Once PRN Buford Dresser, MD        Allergies:   Allergies  Allergen Reactions   Duloxetine     Causes Bp elevation.  Increased body jerks, nausea   Acetaminophen Hives and Rash    Physical Exam  There were no vitals filed for this visit.   There is no height or weight on file to calculate BMI.   General: frail pleasant elderly Caucasian lady seated, with mild anxiety Head: head normocephalic and atraumatic.  Neck: supple with no carotid or supraclavicular bruits Cardiovascular: irregular rate and rhythm, no murmurs Musculoskeletal: no deformity Skin:  no rash/petichiae Vascular:  Normal pulses all extremities  Neurologic Exam Mental Status: Awake and fully alert.  Mild dysarthria with occasional speech hesitancy and word finding difficulty. Oriented to place and time. Recent and remote memory intact. Attention span, concentration and fund of knowledge appropriate. Mood and affect appropriate Cranial Nerves: Pupils equal, briskly reactive to light. Extraocular movements full without nystagmus. Visual fields full to confrontation. Hearing intact. Facial sensation intact. Face, tongue, palate moves normally and symmetrically.  Motor: Normal bulk and tone. Normal strength in all tested  extremity muscles except mild left hip flexor weakness and decreased left hand finger dexterity Sensory.:  Intact light touch, vibratory and pinprick sensation Coordination: Rapid alternating movements normal in all extremities except decreased left hand bilaterally. Finger-to-nose and heel-to-shin performed accurately bilaterally.  Mild action tremors bilateral upper extremities.  No tremors at rest or evidence of cogwheel rigidity or bradykinesia Gait and Station: Arises from chair without difficulty. Stance is normal. Gait demonstrates normal stride length and mild unsteadiness/imbalance with use of Rollator walker. Reflexes: 1+ and symmetric. Toes downgoing.       ASSESSMENT/PLAN: 81 year old Caucasian lady with recent embolic right cerebellar infarct secondary to known AF on Eliquis on 01/09/2020 and history of right MCA infarct in February 2020 secondary to paroxysmal atrial fibrillation which was found on outpatient cardiac event monitor after discharge.  Vascular risk factors of hypertension, hyperlipidemia, A. 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 Fib Hx of R MCA stroke -Residual deficit: Left-sided numbness/tingling, gait impairment with imbalance and mild dysarthria -Continue Pradaxa and atorvastatin 80 mg daily for secondary stroke prevention -Discussed secondary stroke prevention measures and importance of close PCP and cardiology follow-up for aggressive stroke risk factor management including HTN with BP<130/90 and HLD with LDL goal<70. -Continue close follow-up with cardiology for atrial fibrillation and Eliquis management  Seizures, late effect of stroke -continue keppra 500 mg twice daily -No additional seizure activity  Anxiety disorder -Again  encouraged her  to follow-up with PCP to further discuss anxiety.  May benefit from referral to behavioral health but will defer to PCP -Encouraged her to continue sitting yoga as this has been beneficial  Severe sleep apnea -Followed by Dr. Rexene Mercado - prior OV 09/2020 - advised to schedule f/u visit - unable to combine above diagnoses follow-up with sleep apnea follow-up due to time constraints     Follow-up in 6 months or call earlier as needed    CC:  [name], MD     I spent 34 minutes of face-to-face and non-face-to-face time with patient and husband.  This included previsit chart review, lab review, study review, electronic health record documentation, patient and husband education regarding prior stroke with residual deficits, importance of ongoing management of stroke risk factors and secondary stroke prevention measures, seizures and ongoing use of Keppra, underlying anxiety disorder and answered all other questions to patient and husband satisfaction  Frann Rider, AGNP-BC  [facility] [address] [place],  [address]  Phone [phone] Fax [phone] Note: This document was prepared with digital dictation and possible smart phrase technology. Any transcriptional errors that result from this process are unintentional.

## 2022-03-09 ENCOUNTER — Ambulatory Visit (INDEPENDENT_AMBULATORY_CARE_PROVIDER_SITE_OTHER): Payer: Medicare Other | Admitting: Adult Health

## 2022-03-09 ENCOUNTER — Encounter: Payer: Self-pay | Admitting: Adult Health

## 2022-03-09 VITALS — BP 124/78 | HR 72 | Ht 65.0 in | Wt 111.1 lb

## 2022-03-09 DIAGNOSIS — R569 Unspecified convulsions: Secondary | ICD-10-CM | POA: Diagnosis not present

## 2022-03-09 DIAGNOSIS — G4730 Sleep apnea, unspecified: Secondary | ICD-10-CM

## 2022-03-09 DIAGNOSIS — Z8673 Personal history of transient ischemic attack (TIA), and cerebral infarction without residual deficits: Secondary | ICD-10-CM | POA: Diagnosis not present

## 2022-03-09 DIAGNOSIS — I69398 Other sequelae of cerebral infarction: Secondary | ICD-10-CM

## 2022-03-09 DIAGNOSIS — I639 Cerebral infarction, unspecified: Secondary | ICD-10-CM

## 2022-03-09 MED ORDER — LEVETIRACETAM 500 MG PO TABS: 500.0000 mg | ORAL_TABLET | Freq: Two times a day (BID) | ORAL | 3 refills | Status: DC

## 2022-03-09 NOTE — Progress Notes (Addendum)
Fax number was incorrect [phone], called and got correct number [phone]. Fax sent

## 2022-03-09 NOTE — Patient Instructions (Addendum)
Will send a message to your eye doctor for clearance to proceed with surgery at this time   Continue  Pradaxa   and atorvastatin for secondary stroke prevention  Please follow up with your primary doctor regarding anxiety   Continue Keppra 500 mg twice daily for seizure prevention  Continue to follow up with PCP regarding blood pressure and cholesterol management  Maintain strict control of hypertension with blood pressure goal below 130/90 and cholesterol with LDL cholesterol (bad cholesterol) goal below 70 mg/dL.   Signs of a Stroke? Follow the BEFAST method:  Balance Watch for a sudden loss of balance, trouble with coordination or vertigo Eyes Is there a sudden loss of vision in one or both eyes? Or double vision?  Face: Ask the person to smile. Does one side of the face droop or is it numb?  Arms: Ask the person to raise both arms. Does one arm drift downward? Is there weakness or numbness of a leg? Speech: Ask the person to repeat a simple phrase. Does the speech sound slurred/strange? Is the person confused ? Time: If you observe any of these signs, call 911.     Follow up in July as scheduled     Thank you for coming to see [place] at [facility]. I hope we have been able to provide you high quality care today.  You may receive a patient satisfaction survey over the next few weeks. We would appreciate your feedback and comments so that we may continue to improve ourselves and the health of our patients.

## 2022-03-11 DIAGNOSIS — Z23 Encounter for immunization: Secondary | ICD-10-CM | POA: Diagnosis not present

## 2022-03-15 ENCOUNTER — Encounter: Payer: Self-pay | Admitting: *Deleted

## 2022-03-15 ENCOUNTER — Telehealth: Payer: Self-pay | Admitting: Physician Assistant

## 2022-03-15 ENCOUNTER — Other Ambulatory Visit (INDEPENDENT_AMBULATORY_CARE_PROVIDER_SITE_OTHER): Payer: Medicare Other

## 2022-03-15 DIAGNOSIS — E785 Hyperlipidemia, unspecified: Secondary | ICD-10-CM

## 2022-03-15 DIAGNOSIS — R7303 Prediabetes: Secondary | ICD-10-CM

## 2022-03-15 LAB — HEPATIC FUNCTION PANEL
ALT: 22 U/L (ref 0–35)
AST: 28 U/L (ref 0–37)
Albumin: 4.2 g/dL (ref 3.5–5.2)
Alkaline Phosphatase: 65 U/L (ref 39–117)
Bilirubin, Direct: 0.3 mg/dL (ref 0.0–0.3)
Total Bilirubin: 1.4 mg/dL — ABNORMAL HIGH (ref 0.2–1.2)
Total Protein: 7.2 g/dL (ref 6.0–8.3)

## 2022-03-15 LAB — CBC WITH DIFFERENTIAL/PLATELET
Basophils Absolute: 0.1 10*3/uL (ref 0.0–0.1)
Basophils Relative: 1.4 % (ref 0.0–3.0)
Eosinophils Absolute: 0.3 10*3/uL (ref 0.0–0.7)
Eosinophils Relative: 5.4 % — ABNORMAL HIGH (ref 0.0–5.0)
HCT: 44.2 % (ref 36.0–46.0)
Hemoglobin: 14.9 g/dL (ref 12.0–15.0)
Lymphocytes Relative: 32.6 % (ref 12.0–46.0)
Lymphs Abs: 1.5 10*3/uL (ref 0.7–4.0)
MCHC: 33.9 g/dL (ref 30.0–36.0)
MCV: 91.5 fl (ref 78.0–100.0)
Monocytes Absolute: 0.4 10*3/uL (ref 0.1–1.0)
Monocytes Relative: 9.2 % (ref 3.0–12.0)
Neutro Abs: 2.4 10*3/uL (ref 1.4–7.7)
Neutrophils Relative %: 51.4 % (ref 43.0–77.0)
Platelets: 153 10*3/uL (ref 150.0–400.0)
RBC: 4.83 Mil/uL (ref 3.87–5.11)
RDW: 13.6 % (ref 11.5–15.5)
WBC: 4.8 10*3/uL (ref 4.0–10.5)

## 2022-03-15 LAB — BASIC METABOLIC PANEL
BUN: 9 mg/dL (ref 6–23)
CO2: 30 mEq/L (ref 19–32)
Calcium: 9.3 mg/dL (ref 8.4–10.5)
Chloride: 103 mEq/L (ref 96–112)
Creatinine, Ser: 0.66 mg/dL (ref 0.40–1.20)
GFR: 81.47 mL/min (ref >60.00)
Glucose, Bld: 122 mg/dL — ABNORMAL HIGH (ref 70–99)
Potassium: 4.2 mEq/L (ref 3.5–5.1)
Sodium: 139 mEq/L (ref 135–145)

## 2022-03-15 LAB — LIPID PANEL
Cholesterol: 139 mg/dL (ref 0–200)
HDL: 62.9 mg/dL (ref >39.00)
LDL Cholesterol: 61 mg/dL (ref 0–99)
NonHDL: 75.79
Total CHOL/HDL Ratio: 2
Triglycerides: 72 mg/dL (ref 0.0–149.0)
VLDL: 14.4 mg/dL (ref 0.0–40.0)

## 2022-03-15 LAB — TSH: TSH: 4.58 u[IU]/mL (ref 0.35–5.50)

## 2022-03-15 LAB — HEMOGLOBIN A1C: Hgb A1c MFr Bld: 6.8 % — ABNORMAL HIGH (ref 4.6–6.5)

## 2022-03-15 NOTE — Telephone Encounter (Signed)
Systolic BPs are optimal. Diastolic are mostly borderline. Goal diastolic is at least less than 90. I would continue current meds for now. Continue to limit salt as much as possible (ideally less than 2000 mg a day). Continue to monitor BP at home. If diastolic reading 90 or higher 3 times in a row, call. Richardson Dopp, PA-C    03/15/2022 1:23 PM

## 2022-03-15 NOTE — Telephone Encounter (Signed)
Per last ov note 02/22/22:  Author: Liliane Shi, PA-C Author Type: Physician Assistant Filed: 02/22/2022  1:33 PM  Note Status: Written Cosign: Cosign Not Required Encounter Date: 02/22/2022  Problem: Essential hypertension  Editor: Sharmon Revere (Physician Assistant)              Fair control.  She has had some optimal blood pressures at home.  I have asked her to continue to monitor blood pressure and send readings in for review through MyChart.  Continue Emla cream 2.5 mg daily, losartan 100 mg daily, metoprolol tartrate 12.5 mg daily.

## 2022-03-15 NOTE — Telephone Encounter (Signed)
Patient calling with BP readings:  11/1: 142/93 11/2: 136/92 11/3: 136/90 11/7: 124/85 11/8: 128/79 11/9: 128/92 11/10: 125/88 11/11: 145/93 11/12: 131/88 11/13: 124/87 11/15: 124/87

## 2022-03-15 NOTE — Telephone Encounter (Signed)
Returned call to pt regarding blood pressure readings and the message below.  I have left a message for pt to call back and also let her know I would also send her a mychart message.

## 2022-03-22 NOTE — Telephone Encounter (Signed)
Pt viewed results / recommendations regarding her blood pressure via mychart.

## 2022-03-31 ENCOUNTER — Ambulatory Visit (INDEPENDENT_AMBULATORY_CARE_PROVIDER_SITE_OTHER): Payer: Medicare Other

## 2022-03-31 DIAGNOSIS — I639 Cerebral infarction, unspecified: Secondary | ICD-10-CM

## 2022-03-31 LAB — CUP PACEART REMOTE DEVICE CHECK
Battery Remaining Longevity: 129 mo
Battery Voltage: 3.01 V
Brady Statistic AP VP Percent: 0 %
Brady Statistic AP VS Percent: 0 %
Brady Statistic AS VP Percent: 99.95 %
Brady Statistic AS VS Percent: 0.05 %
Brady Statistic RA Percent Paced: 0 %
Brady Statistic RV Percent Paced: 99.95 %
Date Time Interrogation Session: 20231207043346
Implantable Lead Connection Status: 753985
Implantable Lead Connection Status: 753985
Implantable Lead Implant Date: 20211206
Implantable Lead Implant Date: 20211206
Implantable Lead Location: 753858
Implantable Lead Location: 753860
Implantable Lead Model: 3830
Implantable Lead Model: 5076
Implantable Pulse Generator Implant Date: 20211206
Lead Channel Impedance Value: 323 Ohm
Lead Channel Impedance Value: 380 Ohm
Lead Channel Impedance Value: 380 Ohm
Lead Channel Impedance Value: 494 Ohm
Lead Channel Pacing Threshold Amplitude: 1 V
Lead Channel Pacing Threshold Pulse Width: 0.4 ms
Lead Channel Sensing Intrinsic Amplitude: 3.5 mV
Lead Channel Sensing Intrinsic Amplitude: 3.5 mV
Lead Channel Sensing Intrinsic Amplitude: 8.375 mV
Lead Channel Sensing Intrinsic Amplitude: 8.375 mV
Lead Channel Setting Pacing Amplitude: 2 V
Lead Channel Setting Pacing Pulse Width: 0.4 ms
Lead Channel Setting Sensing Sensitivity: 2 mV
Zone Setting Status: 755011

## 2022-04-01 ENCOUNTER — Other Ambulatory Visit: Payer: Self-pay | Admitting: Family Medicine

## 2022-04-07 DIAGNOSIS — H2511 Age-related nuclear cataract, right eye: Secondary | ICD-10-CM | POA: Diagnosis not present

## 2022-04-07 DIAGNOSIS — H269 Unspecified cataract: Secondary | ICD-10-CM | POA: Diagnosis not present

## 2022-04-22 ENCOUNTER — Ambulatory Visit: Payer: Medicare Other | Attending: Internal Medicine | Admitting: Internal Medicine

## 2022-04-22 ENCOUNTER — Encounter: Payer: Self-pay | Admitting: Internal Medicine

## 2022-04-22 VITALS — BP 116/74 | HR 70 | Ht 65.0 in | Wt 107.0 lb

## 2022-04-22 DIAGNOSIS — I63511 Cerebral infarction due to unspecified occlusion or stenosis of right middle cerebral artery: Secondary | ICD-10-CM

## 2022-04-22 DIAGNOSIS — I4821 Permanent atrial fibrillation: Secondary | ICD-10-CM | POA: Diagnosis not present

## 2022-04-22 DIAGNOSIS — Z950 Presence of cardiac pacemaker: Secondary | ICD-10-CM | POA: Diagnosis not present

## 2022-04-22 NOTE — Progress Notes (Signed)
Remote pacemaker transmission.   

## 2022-04-22 NOTE — Progress Notes (Signed)
Patient Care Team: [name], MD as PCP - General Nahser, Wonda Cheng, MD as PCP - Cardiology (Cardiology)   HPI  [facility] [place] is a 81 y.o. female Seen in follow-up for atrial fibrillation with a rapid ventricular response for which she underwent AV junction ablation and pacing 12/21 with 2 RV leads, one RV apical and one left bundle branch block area.    DATE TEST EF    2/20 TEE  60-65 % LAE severe  9/21 Echo   60-65 % BAE severe (LA 25m/m2)             Date Cr K Hgb  11/21 0.8 3.5 14.0   3/22 0.63 3.5 116.1   Thromboembolic risk factors ( age  -2, HTN-1, TIA/CVA-2, Gender-1) for a CHADSVASc Score of 6     Records and Results Reviewed   Past Medical History:  Diagnosis Date   Allergy    CVA (cerebral vascular accident) ([place] 05[place], 01/09/20   Hyperlipidemia LDL goal <70    Hypertension    Palpitations    Vertigo, benign positional     Past Surgical History:  Procedure Laterality Date   AV NODE ABLATION N/A 03/30/2020   Procedure: AV NODE ABLATION;  Surgeon: KDeboraha Sprang MD;  [place] [facility];  Service: Cardiovascular;  Laterality: N/A;   AV NODE ABLATION N/A 03/30/2020   Procedure: AV NODE ABLATION;  Surgeon: KDeboraha Sprang MD;  [place] [facility];  Service: Cardiovascular;  Laterality: N/A;   CARDIOVERSION Left 10/31/2018   Procedure: CARDIOVERSION;  Surgeon: NAcie FredricksonPWonda Cheng MD;  [place]  Service: Cardiovascular;  Laterality: Left;   CARDIOVERSION N/A 01/08/2020   Procedure: CARDIOVERSION;  Surgeon: AElouise Munroe MD;  [facility];  Service: Cardiovascular;  Laterality: N/A;   Hemilaminectomy and microdiskectomy at L4-5 on the left.  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   Procedure: PACEMAKER IMPLANT;  Surgeon: KDeboraha Sprang MD;  [place];  Service: Cardiovascular;  Laterality: N/A;   TEE WITHOUT CARDIOVERSION N/A 06/05/2018   Procedure: TRANSESOPHAGEAL  ECHOCARDIOGRAM (TEE);  Surgeon: CLelon Perla MD;  [facility];  Service: Cardiovascular;  Laterality: N/A;  loop    Current Meds  Medication Sig   amLODipine (NORVASC) 2.5 MG tablet Take 1 tablet (2.5 mg total) by mouth daily.   atorvastatin (LIPITOR) 80 MG tablet TAKE 1 TABLET BY MOUTH DAILY AT 6 PM.   brimonidine (ALPHAGAN) 0.2 % ophthalmic solution Place 1 drop into both eyes 2 times daily.   dorzolamide (TRUSOPT) 2 % ophthalmic solution 1 drop 2 (two) times daily.   dorzolamide-timolol (COSOPT) 2-0.5 % ophthalmic solution 1 drop 2 (two) times daily.   levETIRAcetam (KEPPRA) 500 MG tablet Take 1 tablet (500 mg total) by mouth 2 (two) times daily.   losartan (COZAAR) 50 MG tablet Take 2 tablets (100 mg total) by mouth daily.   metoprolol tartrate (LOPRESSOR) 25 MG tablet TAKE 1/2 TABLET BY MOUTH EVERY DAY AT BEDTIME   PRADAXA 150 MG CAPS capsule Take 1 capsule (150 mg total) by mouth 2 (two) times daily.    Allergies  Allergen Reactions   Duloxetine     Causes Bp elevation.  Increased body jerks, nausea   Acetaminophen Hives and Rash      Review of Systems negative except from HPI and PMH  Physical Exam BP 116/74   Pulse 70   Ht 5'  5" (1.651 m)   Wt 107 lb (48.5 kg)   BMI 17.81 kg/m  Well developed and nourished in no acute distress HENT normal Neck supple   Clear Device pocket well healed; without hematoma or erythema.  There is no tethering  Regular rate and rhythm, no murmurs or gallops Abd-soft with active BS No Clubbing cyanosis edema Skin-warm and dry A & Oriented  Grossly normal sensory and motor function  ECG atrial fib -/12/43   CrCl cannot be calculated (Patient's most recent lab result is older than the maximum 21 days allowed.).   Assessment and  Plan Atrial fibrillation-permanent  AV ablation  Pacemaker-Medtronic-2 V ventricular leads  Protein calorie malnutrition   Rate response had been inactivated at the last visit because  of sensation of worsening symptoms.  We also left her at a rate of 70 having tolerated eating better.  Today we activated rate response and walked her in the office she felt better and was able to keep up with her husband better.  We will leave it this way.  In the event that we need to reprogram it, we will activate sleep mode at 70 and daytime mode at 80  Discussed also her protein calorie malnutrition and the importance suggested that she meet with a dietitian but also consider protein supplements such as boost, Ensure, kind bars etc.

## 2022-04-22 NOTE — Patient Instructions (Signed)
Medication Instructions:  Your physician recommends that you continue on your current medications as directed. Please refer to the Current Medication list given to you today.  *If you need a refill on your cardiac medications before your next appointment, please call your pharmacy*  Follow-Up: At [facility], you and your health needs are our priority.  As part of our continuing mission to provide you with exceptional heart care, we have created designated Provider Care Teams.  These Care Teams include your primary Cardiologist (physician) and Advanced Practice Providers (APPs -  Physician Assistants and Nurse Practitioners) who all work together to provide you with the care you need, when you need it.  Your next appointment:   1 year(s)  The format for your next appointment:   In Person  Provider:   You may see Dr. Virl Axe or one of the following Advanced Practice Providers on your designated Care Team:   Tommye Standard, [place] Legrand Como "Jonni Sanger" Chalmers Cater, [place]     Important Information About Sugar

## 2022-05-23 ENCOUNTER — Other Ambulatory Visit: Payer: Self-pay | Admitting: Cardiovascular Disease

## 2022-05-23 DIAGNOSIS — I4821 Permanent atrial fibrillation: Secondary | ICD-10-CM

## 2022-05-23 NOTE — Telephone Encounter (Signed)
Prescription refill request for Pradaxa received.  Indication: Afib  Last office visit: 04/22/22 Caryl Comes)  Weight: 48.5kg Age: 82 Scr: 0.66 (03/15/22)  CrCl: 50.75m/min  Appropriate dose. Refill sent.

## 2022-05-27 ENCOUNTER — Ambulatory Visit (INDEPENDENT_AMBULATORY_CARE_PROVIDER_SITE_OTHER): Payer: Medicare Other

## 2022-05-27 VITALS — Ht 65.0 in | Wt 107.0 lb

## 2022-05-27 DIAGNOSIS — Z0000 Encounter for general adult medical examination without abnormal findings: Secondary | ICD-10-CM

## 2022-05-27 NOTE — Patient Instructions (Addendum)
[name] , Thank you for taking time to come for your Medicare Wellness Visit. I appreciate your ongoing commitment to your health goals. Please review the following plan we discussed and let me know if I can assist you in the future.   These are the goals we discussed:  Goals       Increase exercise      Continue Yoga.      Patient stated (pt-stated)      I want to be able to walk without a walker.        This is a list of the screening recommended for you and due dates:  Health Maintenance  Topic Date Due   DTaP/Tdap/Td vaccine (1 - Tdap) Never done   Zoster (Shingles) Vaccine (1 of 2) 06/07/2022*   COVID-19 Vaccine (6 - 2023-24 season) 06/12/2022*   DEXA scan (bone density measurement)  05/28/2023*   Medicare Annual Wellness Visit  05/28/2023   Pneumonia Vaccine  Completed   Flu Shot  Completed   HPV Vaccine  Aged Out  *Topic was postponed. The date shown is not the original due date.    Advanced directives: In Chart  Conditions/risks identified: None  Next appointment: Follow up in one year for your annual wellness visit     Preventive Care 65 Years and Older, Female Preventive care refers to lifestyle choices and visits with your health care provider that can promote health and wellness. What does preventive care include? A yearly physical exam. This is also called an annual well check. Dental exams once or twice a year. Routine eye exams. Ask your health care provider how often you should have your eyes checked. Personal lifestyle choices, including: Daily care of your teeth and gums. Regular physical activity. Eating a healthy diet. Avoiding tobacco and drug use. Limiting alcohol use. Practicing safe sex. Taking low-dose aspirin every day. Taking vitamin and mineral supplements as recommended by your health care provider. What happens during an annual well check? The services and screenings done by your health care provider during your annual well check  will depend on your age, overall health, lifestyle risk factors, and family history of disease. Counseling  Your health care provider may ask you questions about your: Alcohol use. Tobacco use. Drug use. Emotional well-being. Home and relationship well-being. Sexual activity. Eating habits. History of falls. Memory and ability to understand (cognition). Work and work Statistician. Reproductive health. Screening  You may have the following tests or measurements: Height, weight, and BMI. Blood pressure. Lipid and cholesterol levels. These may be checked every 5 years, or more frequently if you are over 78 years old. Skin check. Lung cancer screening. You may have this screening every year starting at age 69 if you have a 30-pack-year history of smoking and currently smoke or have quit within the past 15 years. Fecal occult blood test (FOBT) of the stool. You may have this test every year starting at age 65. Flexible sigmoidoscopy or colonoscopy. You may have a sigmoidoscopy every 5 years or a colonoscopy every 10 years starting at age 63. Hepatitis C blood test. Hepatitis B blood test. Sexually transmitted disease (STD) testing. Diabetes screening. This is done by checking your blood sugar (glucose) after you have not eaten for a while (fasting). You may have this done every 1-3 years. Bone density scan. This is done to screen for osteoporosis. You may have this done starting at age 81. Mammogram. This may be done every 1-2 years. Talk to your health  care provider about how often you should have regular mammograms. Talk with your health care provider about your test results, treatment options, and if necessary, the need for more tests. Vaccines  Your health care provider may recommend certain vaccines, such as: Influenza vaccine. This is recommended every year. Tetanus, diphtheria, and acellular pertussis (Tdap, Td) vaccine. You may need a Td booster every 10 years. Zoster vaccine. You  may need this after age 64. Pneumococcal 13-valent conjugate (PCV13) vaccine. One dose is recommended after age 23. Pneumococcal polysaccharide (PPSV23) vaccine. One dose is recommended after age 43. Talk to your health care provider about which screenings and vaccines you need and how often you need them. This information is not intended to replace advice given to you by your health care provider. Make sure you discuss any questions you have with your health care provider. Document Released: 05/08/2015 Document Revised: 12/30/2015 Document Reviewed: 02/10/2015 Elsevier Interactive Patient Education  2017 [place] Prevention in the Home Falls can cause injuries. They can happen to people of all ages. There are many things you can do to make your home safe and to help prevent falls. What can I do on the outside of my home? Regularly fix the edges of walkways and driveways and fix any cracks. Remove anything that might make you trip as you walk through a door, such as a raised step or threshold. Trim any bushes or trees on the path to your home. Use bright outdoor lighting. Clear any walking paths of anything that might make someone trip, such as rocks or tools. Regularly check to see if handrails are loose or broken. Make sure that both sides of any steps have handrails. Any raised decks and porches should have guardrails on the edges. Have any leaves, snow, or ice cleared regularly. Use sand or salt on walking paths during winter. Clean up any spills in your garage right away. This includes oil or grease spills. What can I do in the bathroom? Use night lights. Install grab bars by the toilet and in the tub and shower. Do not use towel bars as grab bars. Use non-skid mats or decals in the tub or shower. If you need to sit down in the shower, use a plastic, non-slip stool. Keep the floor dry. Clean up any water that spills on the floor as soon as it happens. Remove soap buildup  in the tub or shower regularly. Attach bath mats securely with double-sided non-slip rug tape. Do not have throw rugs and other things on the floor that can make you trip. What can I do in the bedroom? Use night lights. Make sure that you have a light by your bed that is easy to reach. Do not use any sheets or blankets that are too big for your bed. They should not hang down onto the floor. Have a firm chair that has side arms. You can use this for support while you get dressed. Do not have throw rugs and other things on the floor that can make you trip. What can I do in the kitchen? Clean up any spills right away. Avoid walking on wet floors. Keep items that you use a lot in easy-to-reach places. If you need to reach something above you, use a strong step stool that has a grab bar. Keep electrical cords out of the way. Do not use floor polish or wax that makes floors slippery. If you must use wax, use non-skid floor wax. Do not have throw  rugs and other things on the floor that can make you trip. What can I do with my stairs? Do not leave any items on the stairs. Make sure that there are handrails on both sides of the stairs and use them. Fix handrails that are broken or loose. Make sure that handrails are as long as the stairways. Check any carpeting to make sure that it is firmly attached to the stairs. Fix any carpet that is loose or worn. Avoid having throw rugs at the top or bottom of the stairs. If you do have throw rugs, attach them to the floor with carpet tape. Make sure that you have a light switch at the top of the stairs and the bottom of the stairs. If you do not have them, ask someone to add them for you. What else can I do to help prevent falls? Wear shoes that: Do not have high heels. Have rubber bottoms. Are comfortable and fit you well. Are closed at the toe. Do not wear sandals. If you use a stepladder: Make sure that it is fully opened. Do not climb a closed  stepladder. Make sure that both sides of the stepladder are locked into place. Ask someone to hold it for you, if possible. Clearly mark and make sure that you can see: Any grab bars or handrails. First and last steps. Where the edge of each step is. Use tools that help you move around (mobility aids) if they are needed. These include: Canes. Walkers. Scooters. Crutches. Turn on the lights when you go into a dark area. Replace any light bulbs as soon as they burn out. Set up your furniture so you have a clear path. Avoid moving your furniture around. If any of your floors are uneven, fix them. If there are any pets around you, be aware of where they are. Review your medicines with your doctor. Some medicines can make you feel dizzy. This can increase your chance of falling. Ask your doctor what other things that you can do to help prevent falls. This information is not intended to replace advice given to you by your health care provider. Make sure you discuss any questions you have with your health care provider. Document Released: 02/05/2009 Document Revised: 09/17/2015 Document Reviewed: 05/16/2014 Elsevier Interactive Patient Education  2017 [employer].

## 2022-05-27 NOTE — Progress Notes (Signed)
Subjective:   Mary Mercado is a 82 y.o. female who presents for Medicare Annual (Subsequent) preventive examination.  Review of Systems    Virtual Visit via Telephone Note  I connected with  Mary Mercado on 05/27/22 at  2:30 PM EST by telephone and verified that I am speaking with the correct person using two identifiers.  Location: Patient: Home Provider: Office Persons participating in the virtual visit: patient/Nurse Health Advisor   I discussed the limitations, risks, security and privacy concerns of performing an evaluation and management service by telephone and the availability of in person appointments. The patient expressed understanding and agreed to proceed.  Interactive audio and video telecommunications were attempted between this nurse and patient, however failed, due to patient having technical difficulties OR patient did not have access to video capability.  We continued and completed visit with audio only.  Some vital signs may be absent or patient reported.   Criselda Peaches, LPN  Cardiac Risk Factors include: advanced age (>13mn, >>95women);hypertension     Objective:    Today's Vitals   05/27/22 1435  Weight: 107 lb (48.5 kg)  Height: '5\' 5"'$  (1.651 m)   Body mass index is 17.81 kg/m.     05/27/2022    2:45 PM 05/26/2021    2:22 PM 08/18/2020    1:18 PM 03/30/2020   11:10 AM 03/30/2020    6:11 AM 02/19/2020    4:03 PM 02/13/2020   11:50 AM  Advanced Directives  Does Patient Have a Medical Advance Directive? Yes Yes Yes Yes Yes Yes Yes  Type of AParamedicof [place]Living will HAlexisLiving will HEs[place]Living will [place]ak CityLiving will [place]Living will HFairview-FerndaleLiving will [place]lindaLiving will  Does patient want to make changes to medical advance directive? No - Patient declined  No - Patient declined  No - Patient declined No - Patient declined    Copy of HMagnoliain Chart? Yes - validated most recent copy scanned in chart (See row information) No - copy requested  No - copy requested No - copy requested Yes - validated most recent copy scanned in chart (See row information) Yes - validated most recent copy scanned in chart (See row information)    Current Medications (verified) Outpatient Encounter Medications as of 05/27/2022  Medication Sig   amLODipine (NORVASC) 2.5 MG tablet Take 1 tablet (2.5 mg total) by mouth daily.   atorvastatin (LIPITOR) 80 MG tablet TAKE 1 TABLET BY MOUTH DAILY AT 6 PM.   brimonidine (ALPHAGAN) 0.2 % ophthalmic solution Place 1 drop into both eyes 2 times daily.   dorzolamide (TRUSOPT) 2 % ophthalmic solution 1 drop 2 (two) times daily.   dorzolamide-timolol (COSOPT) 2-0.5 % ophthalmic solution 1 drop 2 (two) times daily.   levETIRAcetam (KEPPRA) 500 MG tablet Take 1 tablet (500 mg total) by mouth 2 (two) times daily.   losartan (COZAAR) 50 MG tablet Take 2 tablets (100 mg total) by mouth daily.   metoprolol tartrate (LOPRESSOR) 25 MG tablet TAKE 1/2 TABLET BY MOUTH EVERY DAY AT BEDTIME   PRADAXA 150 MG CAPS capsule TAKE 1 CAPSULE BY MOUTH TWICE A DAY   Facility-Administered Encounter Medications as of 05/27/2022  Medication   regadenoson (LEXISCAN) injection SOLN 0.4 mg   technetium tetrofosmin (TC-MYOVIEW) injection 32 millicurie    Allergies (verified) Duloxetine and Acetaminophen   History: Past Medical  History:  Diagnosis Date   Allergy    CVA (cerebral vascular accident) ([place]) 05/2018, 01/09/20   Hyperlipidemia LDL goal <70    Hypertension    Palpitations    Vertigo, benign positional    Past Surgical History:  Procedure Laterality Date   AV NODE ABLATION N/A 03/30/2020   Procedure: AV NODE ABLATION;  Surgeon: Deboraha Sprang, MD;  Location: [place] [facility];  Service: Cardiovascular;  Laterality: N/A;    AV NODE ABLATION N/A 03/30/2020   Procedure: AV NODE ABLATION;  Surgeon: Deboraha Sprang, MD;  Location: [place] [facility];  Service: Cardiovascular;  Laterality: N/A;   CARDIOVERSION Left 10/31/2018   Procedure: CARDIOVERSION;  Surgeon: Acie Fredrickson Wonda Cheng, MD;  Location: [place];  Service: Cardiovascular;  Laterality: Left;   CARDIOVERSION N/A 01/08/2020   Procedure: CARDIOVERSION;  Surgeon: [name], MD;  Location: [facility];  Service: Cardiovascular;  Laterality: N/A;   Hemilaminectomy and microdiskectomy at L4-5 on the left.  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   Procedure: PACEMAKER IMPLANT;  Surgeon: Deboraha Sprang, MD;  Location: [place] [facility];  Service: Cardiovascular;  Laterality: N/A;   TEE WITHOUT CARDIOVERSION N/A 06/05/2018   Procedure: TRANSESOPHAGEAL ECHOCARDIOGRAM (TEE);  Surgeon: Lelon Perla, MD;  Location: [facility]COPY;  Service: Cardiovascular;  Laterality: N/A;  loop   Family History  Problem Relation Age of Onset   Diabetes Mother    Heart attack Mother        25   Colon cancer Father    Heart attack Brother 14   Sleep apnea Neg Hx    Social History   Socioeconomic History   Marital status: Married    Spouse name: [name]   Number of children: Not on file   Years of education: 12+   Highest education level: Not on file  Occupational History    Comment: retired  Tobacco Use   Smoking status: Never   Smokeless tobacco: Never  Vaping Use   Vaping Use: Never used  Substance and Sexual Activity   Alcohol use: Never   Drug use: Never   Sexual activity: Not on file  Other Topics Concern   Not on file  Social History Narrative   Lives with husband   caffeine none daily   Right Handed   Social Determinants of Health   Financial Resource Strain: Low Risk  (05/27/2022)   Overall Financial Resource Strain (CARDIA)    Difficulty of Paying Living Expenses: Not hard at all  Food Insecurity: No Food Insecurity (05/27/2022)    Hunger Vital Sign    Worried About Running Out of Food in the Last Year: Never true    [place] in the Last Year: Never true  Transportation Needs: No Transportation Needs (05/27/2022)   PRAPARE - Hydrologist (Medical): No    Lack of Transportation (Non-Medical): No  Physical Activity: Insufficiently Active (05/27/2022)   Exercise Vital Sign    Days of Exercise per Week: 2 days    Minutes of Exercise per Session: 50 min  Stress: No Stress Concern Present (05/27/2022)   [place]    Feeling of Stress : Not at all  Social Connections: [place] (05/27/2022)   Social Connection and Isolation Panel [NHANES]    Frequency of Communication with Friends and Family: More than three times a week    Frequency of Social Gatherings with Friends  and Family: More than three times a week    Attends Religious Services: More than 4 times per year    Active Member of Clubs or Organizations: Yes    Attends Music therapist: More than 4 times per year    Marital Status: Married    Tobacco Counseling Counseling given: Not Answered   Clinical Intake:  Pre-visit preparation completed: No  Pain : No/denies pain     BMI - recorded: 17.81 Nutritional Status: BMI <19  Underweight Nutritional Risks: None Diabetes: No  How often do you need to have someone help you when you read instructions, pamphlets, or other written materials from your doctor or pharmacy?: 1 - Never  Diabetic?  No  Interpreter Needed?: No  Information entered by :: Ursula Alert LPN   Activities of Daily Living    05/27/2022    2:43 PM  In your present state of health, do you have any difficulty performing the following activities:  Hearing? 0  Vision? 0  Difficulty concentrating or making decisions? 0  Walking or climbing stairs? 0  Dressing or bathing? 0  Doing errands, shopping? 0  Preparing Food  and eating ? N  Using the Toilet? N  In the past six months, have you accidently leaked urine? N  Do you have problems with loss of bowel control? N  Managing your Medications? N  Managing your Finances? N  Housekeeping or managing your Housekeeping? N    Patient Care Team: [name], MD as PCP - General Nahser, Wonda Cheng, MD as PCP - Cardiology (Cardiology)  Indicate any recent Medical Services you may have received from other than [facility] providers in the past year (date may be approximate).     Assessment:   This is a routine wellness examination for Mary Mercado.  Hearing/Vision screen Hearing Screening - Comments:: Denies hearing difficulties   Vision Screening - Comments:: Wears rx glasses - up to date with routine eye exams with  Dr Edilia Bo  Dietary issues and exercise activities discussed: Exercise limited by: None identified   Goals Addressed               This Visit's Progress     Patient stated (pt-stated)        I want to be able to walk without a walker.       Depression Screen    05/27/2022    2:42 PM 03/07/2022    8:39 AM 01/13/2022    9:23 AM 05/26/2021    2:15 PM 03/04/2021    9:05 AM 10/29/2020   10:54 AM 02/07/2020    3:24 PM  PHQ 2/9 Scores  PHQ - 2 Score 0 1 0 0 0 2 2  PHQ- 9 Score  1 0  0  5    Fall Risk    05/27/2022    2:44 PM 03/07/2022    8:39 AM 01/13/2022    9:23 AM 05/26/2021    2:20 PM 03/04/2021    9:06 AM  Fall Risk   Falls in the past year? 0 0 0 0 0  Number falls in past yr: 0 0 0 0 0  Injury with Fall? 0 0 0 0 0  Risk for fall due to : No Fall Risks History of fall(s) Impaired balance/gait  No Fall Risks  Follow up Falls prevention discussed Falls evaluation completed Falls evaluation completed      [place]:  Any stairs in or around the  home? No  If so, are there any without handrails? Yes  Home free of loose throw rugs in walkways, pet beds, electrical cords, etc? Yes  Adequate lighting in  your home to reduce risk of falls? Yes   ASSISTIVE DEVICES UTILIZED TO PREVENT FALLS:  Life alert? No  Use of a cane, walker or w/c? Yes  Grab bars in the bathroom? Yes  Shower chair or bench in shower? Yes  Elevated toilet seat or a handicapped toilet? Yes   TIMED UP AND GO:  Was the test performed? No .   Cognitive Function:        05/27/2022    2:45 PM 05/26/2021    2:22 PM  6CIT Screen  What Year? 0 points 0 points  What month? 0 points 0 points  What time? 0 points 0 points  Count back from 20 0 points 0 points  Months in reverse 0 points 0 points  Repeat phrase 0 points 0 points  Total Score 0 points 0 points    Immunizations Immunization History  Administered Date(s) Administered   Fluad Quad(high Dose 65+) 02/11/2019, 02/05/2020, 03/04/2021, 03/07/2022   Influenza, High Dose Seasonal PF 06/19/2018   PFIZER(Purple Top)SARS-COV-2 Vaccination 05/17/2019, 06/07/2019, 01/12/2021   PPD Test 11/15/2019   Pneumococcal Conjugate-13 02/11/2019   Pneumococcal Polysaccharide-23 03/04/2021    TDAP status: Due, Education has been provided regarding the importance of this vaccine. Advised may receive this vaccine at local pharmacy or Health Dept. Aware to provide a copy of the vaccination record if obtained from local pharmacy or Health Dept. Verbalized acceptance and understanding.  Flu Vaccine status: Up to date  Pneumococcal vaccine status: Up to date  Covid-19 vaccine status: Completed vaccines  Qualifies for Shingles Vaccine? Yes   Zostavax completed No   Shingrix Completed?: No.    Education has been provided regarding the importance of this vaccine. Patient has been advised to call insurance company to determine out of pocket expense if they have not yet received this vaccine. Advised may also receive vaccine at local pharmacy or Health Dept. Verbalized acceptance and understanding.  Screening Tests Health Maintenance  Topic Date Due   DTaP/Tdap/Td (1 - Tdap)  Never done   Zoster Vaccines- Shingrix (1 of 2) 06/07/2022 (Originally 05/25/1959)   COVID-19 Vaccine (6 - 2023-24 season) 06/12/2022 (Originally 05/06/2022)   DEXA SCAN  05/28/2023 (Originally 05/25/2004)   Medicare Annual Wellness (AWV)  05/28/2023   Pneumonia Vaccine 32+ Years old  Completed   INFLUENZA VACCINE  Completed   HPV VACCINES  Aged Out    Health Maintenance  Health Maintenance Due  Topic Date Due   DTaP/Tdap/Td (1 - Tdap) Never done    Colorectal cancer screening: No longer required.   Mammogram status: No longer required due to Age.  Bone Density status: Ordered Patient deferred. Pt provided with contact info and advised to call to schedule appt.  Lung Cancer Screening: (Low Dose CT Chest recommended if Age 31-80 years, 30 pack-year currently smoking OR have quit w/in 15years.) does not qualify.    Additional Screening:  Hepatitis C Screening: does not qualify; Completed   Vision Screening: Recommended annual ophthalmology exams for early detection of glaucoma and other disorders of the eye. Is the patient up to date with their annual eye exam?  Yes  Who is the provider or what is the name of the office in which the patient attends annual eye exams? Dr Edilia Bo If pt is not established with a provider, would they  like to be referred to a provider to establish care? No .   Dental Screening: Recommended annual dental exams for proper oral hygiene  Community Resource Referral / Chronic Care Management:  CRR required this visit?  No   CCM required this visit?  No      Plan:     I have personally reviewed and noted the following in the patient's chart:   Medical and social history Use of alcohol, tobacco or illicit drugs  Current medications and supplements including opioid prescriptions. Patient is not currently taking opioid prescriptions. Functional ability and status Nutritional status Physical activity Advanced directives List of other  physicians Hospitalizations, surgeries, and ER visits in previous 12 months Vitals Screenings to include cognitive, depression, and falls Referrals and appointments  In addition, I have reviewed and discussed with patient certain preventive protocols, quality metrics, and best practice recommendations. A written personalized care plan for preventive services as well as general preventive health recommendations were provided to patient.     Criselda Peaches, LPN   06/01/348   Nurse Notes: None

## 2022-06-01 ENCOUNTER — Telehealth: Payer: Self-pay | Admitting: Internal Medicine

## 2022-06-01 ENCOUNTER — Telehealth: Payer: Self-pay | Admitting: Neurology

## 2022-06-01 ENCOUNTER — Telehealth: Payer: Self-pay | Admitting: Adult Health

## 2022-06-01 DIAGNOSIS — I4821 Permanent atrial fibrillation: Secondary | ICD-10-CM

## 2022-06-01 DIAGNOSIS — I480 Paroxysmal atrial fibrillation: Secondary | ICD-10-CM

## 2022-06-01 NOTE — Telephone Encounter (Signed)
Pt c/o medication issue:  1. Name of Medication: PRADAXA 150 MG CAPS capsule   2. How are you currently taking this medication (dosage and times per day)?   TAKE 1 CAPSULE BY MOUTH TWICE A DAY    3. Are you having a reaction (difficulty breathing--STAT)? no  4. What is your medication issue? Calling to say that [facility] no longer has this on list for medication. Calling to see what options are there. Please advise

## 2022-06-01 NOTE — Telephone Encounter (Signed)
Yes, Pradaxa is managed by cardiology and can discuss medication concerns with their office. Thank you.

## 2022-06-01 NOTE — Telephone Encounter (Signed)
I reviewed chart:    This med was just prescribed by Dr. Cathie Olden on 05/23/2022. I feel like this should be discussed with his office?

## 2022-06-01 NOTE — Telephone Encounter (Signed)
Patient had CVA while on Eliquis. Will attempt PA for Pradaxa.  Key: [id number]  Called husband and let him know PA pending.

## 2022-06-01 NOTE — Telephone Encounter (Signed)
Husband states they have received a letter from [employer] re: pt's PRADAXA 150 MG CAPS capsule  Husband states the letter is requesting a letter from Dr Sethi(which prescribed the medication when pt was in hospital) Husband states this is the only medication pt can take.  He states pt was in the hospital as a result of previously taking Eloquist.  Husband is asking for a call from RN, as there are other request in the letter.  This message is being routed to POD 3 and POD 2 based on pt last seeing Jessica,NP and in the event that anything is required of POD 2 re: Dr Leonie Man

## 2022-06-01 NOTE — Telephone Encounter (Signed)
Error

## 2022-06-02 ENCOUNTER — Encounter (HOSPITAL_COMMUNITY): Payer: Self-pay | Admitting: *Deleted

## 2022-06-02 MED ORDER — DABIGATRAN ETEXILATE MESYLATE 150 MG PO CAPS: 150.0000 mg | ORAL_CAPSULE | Freq: Two times a day (BID) | ORAL | 3 refills | Status: DC

## 2022-06-02 NOTE — Telephone Encounter (Signed)
Called patient and she is aware

## 2022-06-02 NOTE — Telephone Encounter (Signed)
PA for Pradaxa approved through 06/02/23.

## 2022-06-02 NOTE — Addendum Note (Signed)
Addended by: Rollen Sox on: 06/02/2022 11:06 AM   Modules accepted: Orders

## 2022-06-07 NOTE — Telephone Encounter (Signed)
After checking pt's DPR a detailed message was left on pt's vm with response from RN.

## 2022-06-15 IMAGING — MR MR HEAD W/O CM
9 of 10 series · 38 of 48 positions shown · non-contrast
Comparison: Head CT June 28, 2020

CLINICAL DATA: Neuro deficit, acute, stroke suspected.

EXAM:
MRI HEAD WITHOUT CONTRAST
TECHNIQUE: Multiplanar, multiecho pulse sequences of the brain and surrounding
structures were obtained without intravenous contrast.

[Series 3: DWI · axial · 3.0mm · 1.09mm/px · z∈[-100,+59]mm · 11 of 110 slices shown (1 of 4)]
[im 1/110]
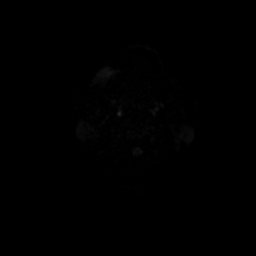
[im 11/110]
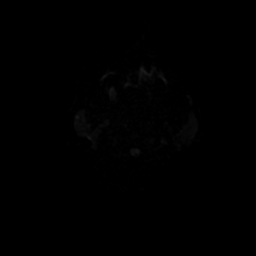
[im 22/110]
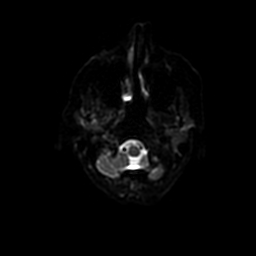
[im 33/110]
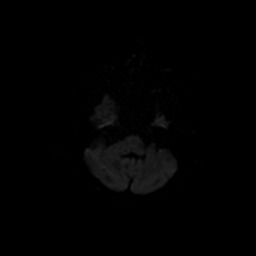
[im 44/110]
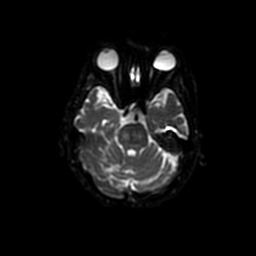
[im 55/110]
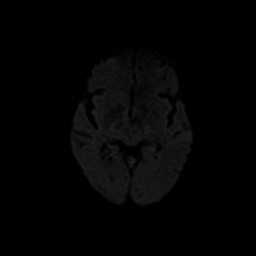
[im 66/110]
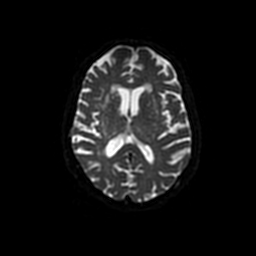
[im 77/110]
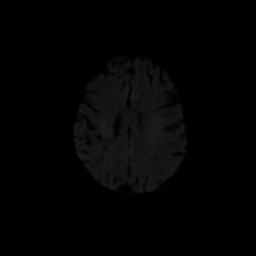
[im 88/110]
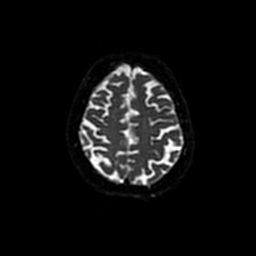
[im 99/110]
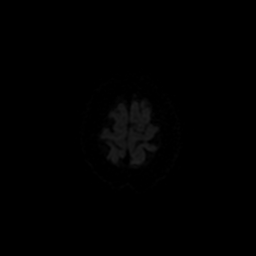
[im 110/110]
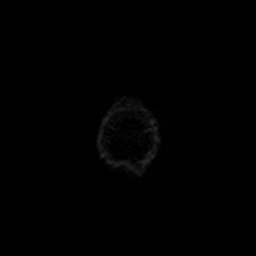

[Series 4: DWI · coronal · 5.0mm · 1.09mm/px · 8 of 78 slices shown (2 of 4)]
[im 1/78]
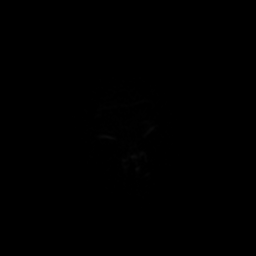
[im 12/78]
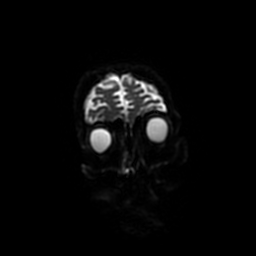
[im 23/78]
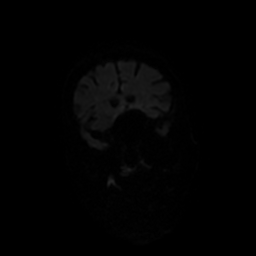
[im 34/78]
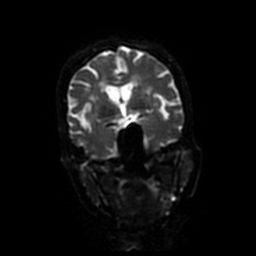
[im 45/78]
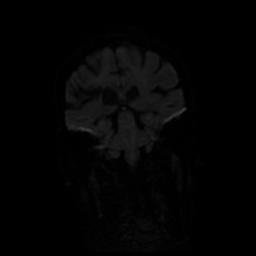
[im 56/78]
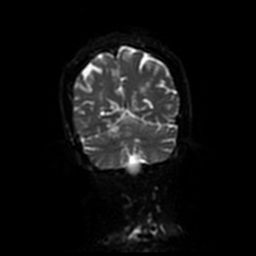
[im 67/78]
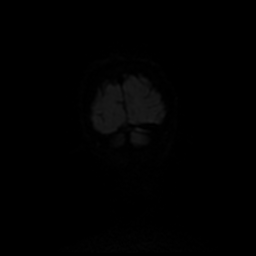
[im 78/78]
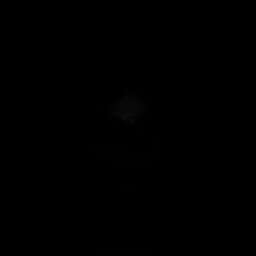

[Series 5: T1 · sagittal · 5.0mm · 0.47mm/px · 2 of 23 slices shown (1 of 2)]
[im 1/23]
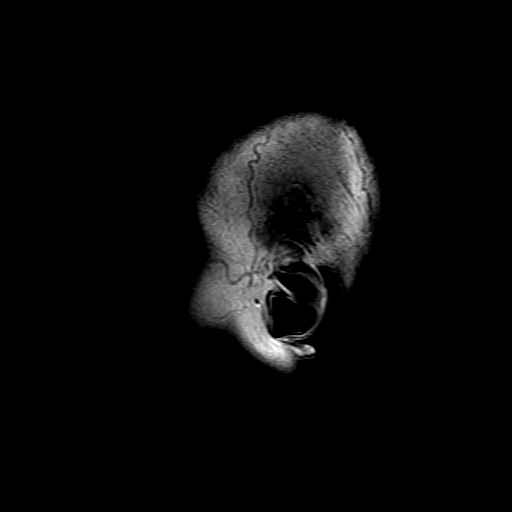
[im 23/23]
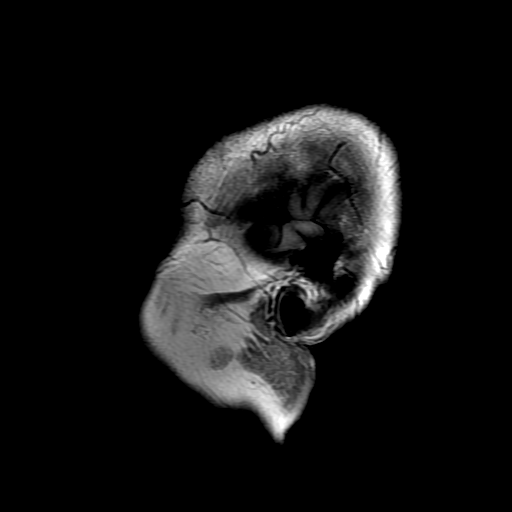

[Series 6: T2 · axial · 5.0mm · 0.43mm/px · z∈[-84,+65]mm · 2 of 26 slices shown (1 of 2)]
[im 1/26]
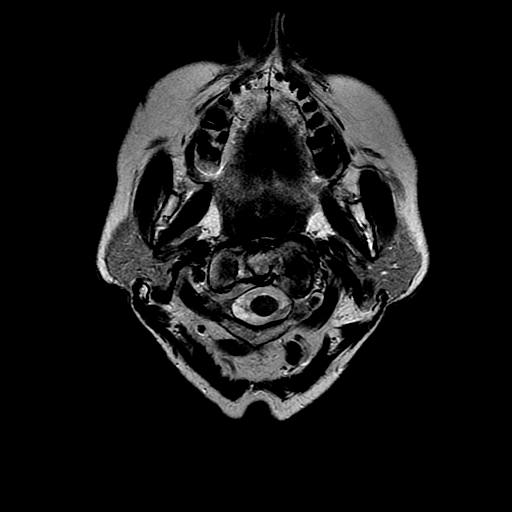
[im 26/26]
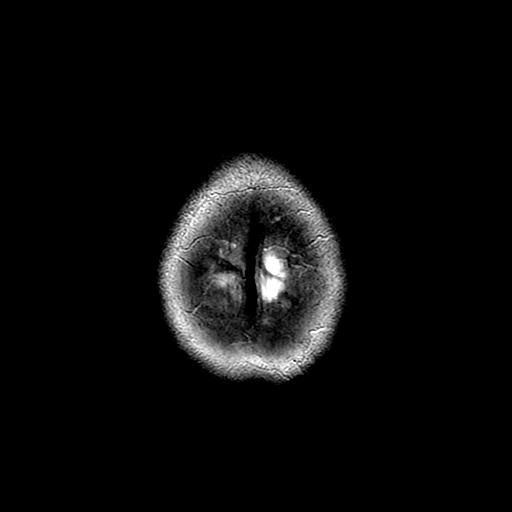

[Series 7: FLAIR · axial · 3.0mm · 0.43mm/px · z∈[-84,+65]mm · 2 of 26 slices shown]
[im 1/26]
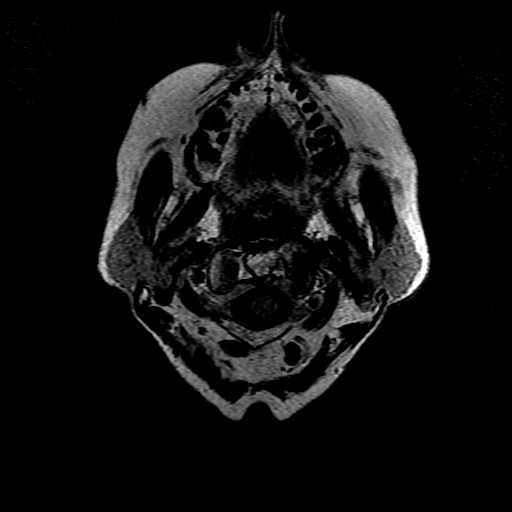
[im 26/26]
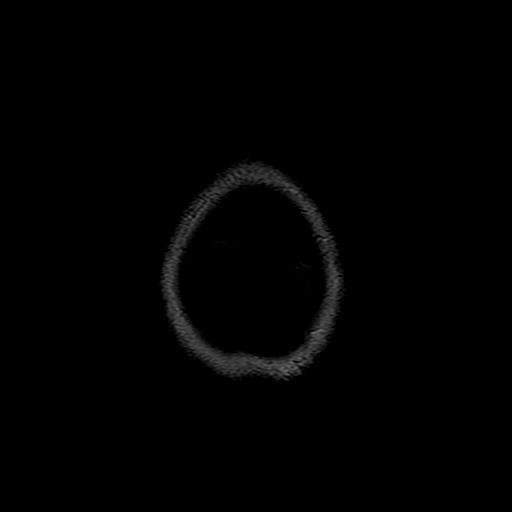

[Series 9: T1 · axial · 3.0mm · 0.47mm/px · z∈[-87,-71]mm · 2 of 104 slices shown (2 of 2)]
[im 1/104]
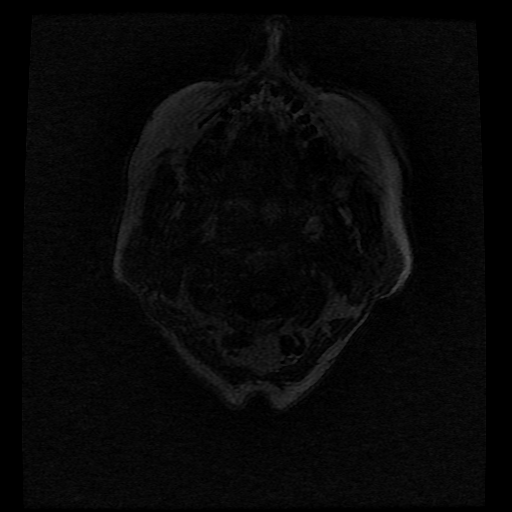
[im 12/104]
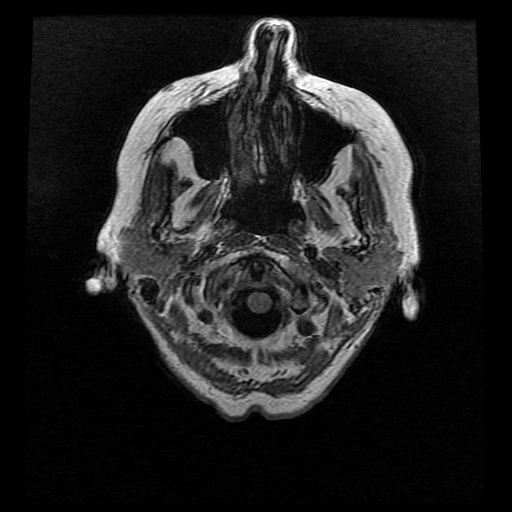

[Series 10: T2 · coronal · 5.0mm · 0.39mm/px · 2 of 24 slices shown (2 of 2)]
[im 1/24]
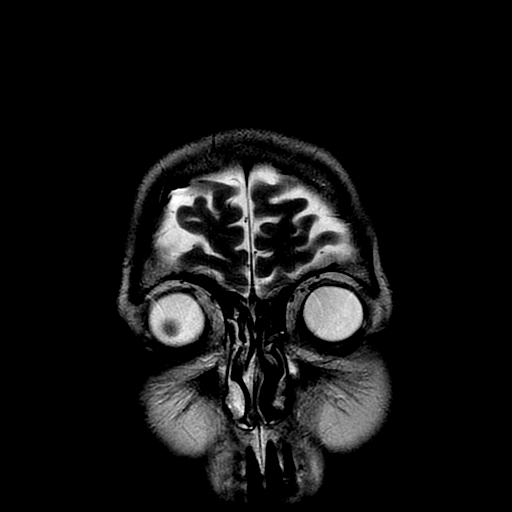
[im 24/24]
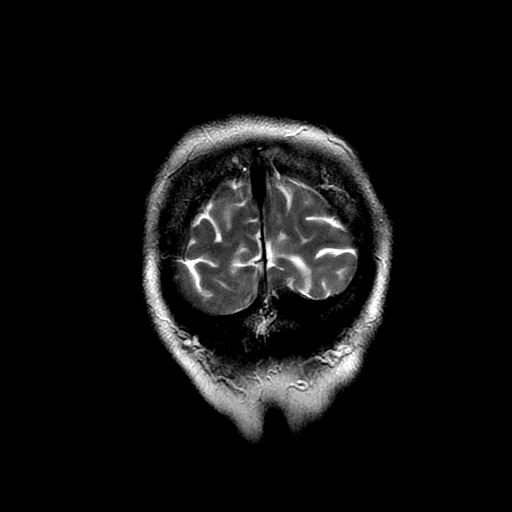

[Series 300: DWI · axial · 3.0mm · 1.09mm/px · z∈[-100,+59]mm · 5 of 55 slices shown (3 of 4)]
[im 1/55]
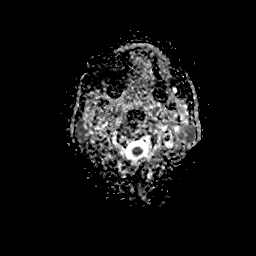
[im 14/55]
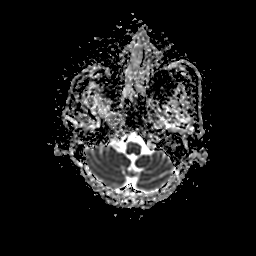
[im 28/55]
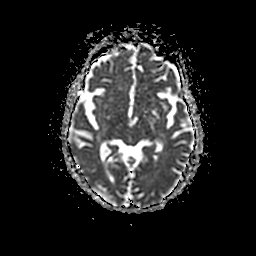
[im 41/55]
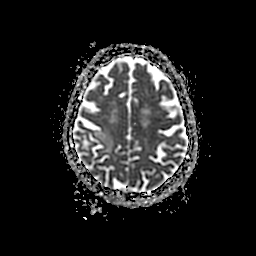
[im 55/55]
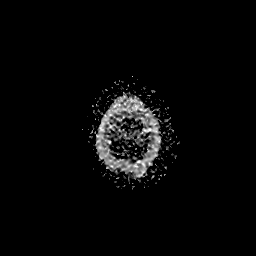

[Series 400: DWI · coronal · 5.0mm · 1.09mm/px · 4 of 39 slices shown (4 of 4)]
[im 1/39]
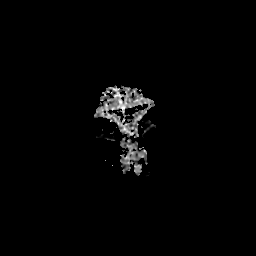
[im 13/39]
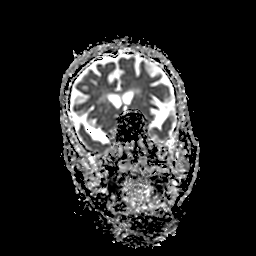
[im 26/39]
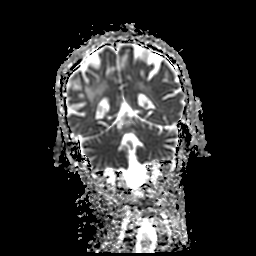
[im 39/39]
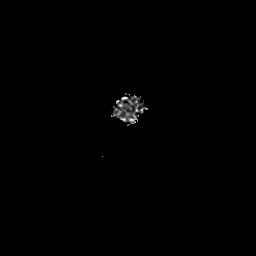

[38 of 48 positions shown; findings below may reference images not displayed]

FINDINGS: Brain: No acute infarction, hemorrhage, hydrocephalus, extra-axial
collection or mass lesion. Remote lacunar infarcts in the bilateral
cerebellar hemispheres. Area of encephalomalacia and gliosis in the
right parietal lobe. Scattered and confluent foci of T2
hyperintensity are seen white matter of the cerebral hemispheres and
within the pons, nonspecific, most likely related to chronic
microvascular ischemic changes. Focus of susceptibility artifact in
the right cerebellar hemisphere, right basal ganglia and insula,
suggesting hemosiderin deposits from prior microhemorrhage

Vascular: Normal flow voids.

Skull and upper cervical spine: Normal marrow signal.

Sinuses/Orbits: Left lens surgery.  Paranasal sinuses are clear

Other: None.
IMPRESSION: 1. No acute intracranial abnormality.
2. Remote lacunar infarcts in the bilateral cerebellar hemispheres.
3. Encephalomalacia and gliosis in the right parietal lobe.
4. Moderate chronic microvascular ischemic changes.

## 2022-06-30 ENCOUNTER — Ambulatory Visit: Payer: Medicare Other

## 2022-06-30 DIAGNOSIS — I4430 Unspecified atrioventricular block: Secondary | ICD-10-CM | POA: Diagnosis not present

## 2022-06-30 LAB — CUP PACEART REMOTE DEVICE CHECK
Battery Remaining Longevity: 126 mo
Battery Voltage: 3.01 V
Brady Statistic AP VP Percent: 0 %
Brady Statistic AP VS Percent: 0 %
Brady Statistic AS VP Percent: 99.92 %
Brady Statistic AS VS Percent: 0.08 %
Brady Statistic RA Percent Paced: 0 %
Brady Statistic RV Percent Paced: 99.92 %
Date Time Interrogation Session: 20240307042750
Implantable Lead Connection Status: 753985
Implantable Lead Connection Status: 753985
Implantable Lead Implant Date: 20211206
Implantable Lead Implant Date: 20211206
Implantable Lead Location: 753858
Implantable Lead Location: 753860
Implantable Lead Model: 3830
Implantable Lead Model: 5076
Implantable Pulse Generator Implant Date: 20211206
Lead Channel Impedance Value: 342 Ohm
Lead Channel Impedance Value: 380 Ohm
Lead Channel Impedance Value: 399 Ohm
Lead Channel Impedance Value: 494 Ohm
Lead Channel Pacing Threshold Amplitude: 0.75 V
Lead Channel Pacing Threshold Pulse Width: 0.4 ms
Lead Channel Sensing Intrinsic Amplitude: 8.375 mV
Lead Channel Sensing Intrinsic Amplitude: 8.375 mV
Lead Channel Setting Pacing Amplitude: 2 V
Lead Channel Setting Pacing Pulse Width: 0.4 ms
Lead Channel Setting Sensing Sensitivity: 2 mV
Zone Setting Status: 755011

## 2022-07-11 DIAGNOSIS — H401134 Primary open-angle glaucoma, bilateral, indeterminate stage: Secondary | ICD-10-CM | POA: Diagnosis not present

## 2022-07-29 ENCOUNTER — Other Ambulatory Visit: Payer: Self-pay | Admitting: Family Medicine

## 2022-07-29 DIAGNOSIS — I10 Essential (primary) hypertension: Secondary | ICD-10-CM

## 2022-08-02 NOTE — Progress Notes (Signed)
Remote pacemaker transmission.   

## 2022-08-11 ENCOUNTER — Telehealth: Payer: Self-pay | Admitting: Family Medicine

## 2022-08-11 NOTE — Telephone Encounter (Signed)
Pt called to ask for advice.  Pt states medication may be giving her diarrhea.  Pt would like a call back to discuss.

## 2022-08-12 ENCOUNTER — Encounter: Payer: Self-pay | Admitting: Adult Health

## 2022-08-12 ENCOUNTER — Ambulatory Visit (INDEPENDENT_AMBULATORY_CARE_PROVIDER_SITE_OTHER): Payer: Medicare Other | Admitting: Adult Health

## 2022-08-12 VITALS — BP 120/62 | HR 78 | Temp 97.5°F | Ht 65.0 in | Wt 105.0 lb

## 2022-08-12 DIAGNOSIS — R197 Diarrhea, unspecified: Secondary | ICD-10-CM | POA: Diagnosis not present

## 2022-08-12 DIAGNOSIS — R7303 Prediabetes: Secondary | ICD-10-CM | POA: Diagnosis not present

## 2022-08-12 LAB — HEMOGLOBIN A1C: Hgb A1c MFr Bld: 6.6 % — ABNORMAL HIGH (ref 4.6–6.5)

## 2022-08-12 LAB — BASIC METABOLIC PANEL
BUN: 16 mg/dL (ref 6–23)
CO2: 29 mEq/L (ref 19–32)
Calcium: 9.4 mg/dL (ref 8.4–10.5)
Chloride: 103 mEq/L (ref 96–112)
Creatinine, Ser: 0.64 mg/dL (ref 0.40–1.20)
GFR: 81.84 mL/min (ref >60.00)
Glucose, Bld: 194 mg/dL — ABNORMAL HIGH (ref 70–99)
Potassium: 4.7 mEq/L (ref 3.5–5.1)
Sodium: 138 mEq/L (ref 135–145)

## 2022-08-12 NOTE — Patient Instructions (Signed)
It was great meeting you today   I would recommend when you have diarrhea to take 1/2 tab of Imodium every 12 hours as needed.

## 2022-08-12 NOTE — Progress Notes (Signed)
Subjective:    Patient ID: Mary Mercado, female    DOB: 1941-03-03, 82 y.o.   MRN: [id number]  Diarrhea    82 year old female who  has a past medical history of Allergy, CVA (cerebral vascular accident) (05/2018, 01/09/20), Hyperlipidemia LDL goal <70, Hypertension, Palpitations, and Vertigo, benign positional.  She is with her husband today.   She is a patient of Dr. Clent Ridges who I am seeing today for the complaint of diarrhea. She reports that for a long time she has had frequent bouts of diarrhea. She may have 25/30 days in a month with some form of loose stools/diarrhea. Most recently she had diarrhea x 2 days. Today he stools are normal. She does have some cramping in her abdomen when she has diarrhea.   He is also due for an A1c check due to prediabetes    Review of Systems  Gastrointestinal:  Positive for diarrhea.   See HPI   Past Medical History:  Diagnosis Date   Allergy    CVA (cerebral vascular accident) 05/2018, 01/09/20   Hyperlipidemia LDL goal <70    Hypertension    Palpitations    Vertigo, benign positional     Social History   Socioeconomic History   Marital status: Married    Spouse name: [name]   Number of children: Not on file   Years of education: 12+   Highest education level: Not on file  Occupational History    Comment: retired  Tobacco Use   Smoking status: Never   Smokeless tobacco: Never  Vaping Use   Vaping Use: Never used  Substance and Sexual Activity   Alcohol use: Never   Drug use: Never   Sexual activity: Not on file  Other Topics Concern   Not on file  Social History Narrative   Lives with husband   caffeine none daily   Right Handed   Social Determinants of Health   Financial Resource Strain: Low Risk  (05/27/2022)   Overall Financial Resource Strain (CARDIA)    Difficulty of Paying Living Expenses: Not hard at all  Food Insecurity: No Food Insecurity (05/27/2022)   Hunger Vital Sign    Worried About Running Out of  Food in the Last Year: Never true    Ran Out of Food in the Last Year: Never true  Transportation Needs: No Transportation Needs (05/27/2022)   PRAPARE - Administrator, Civil Service (Medical): No    Lack of Transportation (Non-Medical): No  Physical Activity: Insufficiently Active (05/27/2022)   Exercise Vital Sign    Days of Exercise per Week: 2 days    Minutes of Exercise per Session: 50 min  Stress: No Stress Concern Present (05/27/2022)   [employer] of Occupational Health - Occupational Stress Questionnaire    Feeling of Stress : Not at all  Social Connections: Socially Integrated (05/27/2022)   Social Connection and Isolation Panel [NHANES]    Frequency of Communication with Friends and Family: More than three times a week    Frequency of Social Gatherings with Friends and Family: More than three times a week    Attends Religious Services: More than 4 times per year    Active Member of [employer] or Organizations: Yes    Attends Banker Meetings: More than 4 times per year    Marital Status: Married  Catering manager Violence: Not At Risk (05/27/2022)   Humiliation, Afraid, Rape, and Kick questionnaire    Fear of Current or  Ex-Partner: No    Emotionally Abused: No    Physically Abused: No    Sexually Abused: No    Past Surgical History:  Procedure Laterality Date   AV NODE ABLATION N/A 03/30/2020   Procedure: AV NODE ABLATION;  Surgeon: Duke Salvia, MD;  Location: [facility] INVASIVE CV LAB;  Service: Cardiovascular;  Laterality: N/A;   AV NODE ABLATION N/A 03/30/2020   Procedure: AV NODE ABLATION;  Surgeon: Duke Salvia, MD;  Location[facility] INVASIVE CV LAB;  Service: Cardiovascular;  Laterality: N/A;   CARDIOVERSION Left 10/31/2018   Procedure: CARDIOVERSION;  Surgeon: Elease Hashimoto Deloris Ping, MD;  Location: [facility] ENDOSCOPY;  Service: Cardiovascular;  Laterality: Left;   CARDIOVERSION N/A 01/08/2020   Procedure: CARDIOVERSION;  Surgeon: Parke Poisson, MD;  Location:  [facility] ENDOSCOPY;  Service: Cardiovascular;  Laterality: N/A;   Hemilaminectomy and microdiskectomy at L4-5 on the left.  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   Procedure: PACEMAKER IMPLANT;  Surgeon: Duke Salvia, MD;  Location: [facility] INVASIVE CV LAB;  Service: Cardiovascular;  Laterality: N/A;   TEE WITHOUT CARDIOVERSION N/A 06/05/2018   Procedure: TRANSESOPHAGEAL ECHOCARDIOGRAM (TEE);  Surgeon: Lewayne Bunting, MD;  Location: [facility] ENDOSCOPY;  Service: Cardiovascular;  Laterality: N/A;  loop    Family History  Problem Relation Age of Onset   Diabetes Mother    Heart attack Mother        67   Colon cancer Father    Heart attack Brother 10   Sleep apnea Neg Hx     Allergies  Allergen Reactions   Duloxetine     Causes Bp elevation.  Increased body jerks, nausea   Acetaminophen Hives and Rash    Current Outpatient Medications on File Prior to Visit  Medication Sig Dispense Refill   amLODipine (NORVASC) 2.5 MG tablet Take 1 tablet (2.5 mg total) by mouth daily. 90 tablet 3   atorvastatin (LIPITOR) 80 MG tablet TAKE 1 TABLET BY MOUTH DAILY AT 6 PM. 90 tablet 1   brimonidine (ALPHAGAN) 0.2 % ophthalmic solution Place 1 drop into both eyes 2 times daily.     dabigatran (PRADAXA) 150 MG CAPS capsule Take 1 capsule (150 mg total) by mouth 2 (two) times daily. 180 capsule 3   dorzolamide (TRUSOPT) 2 % ophthalmic solution 1 drop 2 (two) times daily.     dorzolamide-timolol (COSOPT) 2-0.5 % ophthalmic solution 1 drop 2 (two) times daily.     levETIRAcetam (KEPPRA) 500 MG tablet Take 1 tablet (500 mg total) by mouth 2 (two) times daily. 180 tablet 3   losartan (COZAAR) 50 MG tablet TAKE 2 TABLETS BY MOUTH EVERY DAY 180 tablet 0   metoprolol tartrate (LOPRESSOR) 25 MG tablet TAKE 1/2 TABLET BY MOUTH EVERY DAY AT BEDTIME 45 tablet 2   Current Facility-Administered Medications on File Prior to Visit  Medication Dose Route Frequency Provider Last Rate Last Admin   regadenoson (LEXISCAN)  injection SOLN 0.4 mg  0.4 mg Intravenous Once Jodelle Red, MD       technetium tetrofosmin (TC-MYOVIEW) injection 32 millicurie  32 millicurie Intravenous Once PRN Jodelle Red, MD        BP 120/62   Pulse 78   Temp (!) 97.5 F (36.4 C) (Oral)   Ht  (1.651 m)   Wt 105 lb (47.6 kg)   SpO2 98%   BMI 17.47 kg/m       Objective:   Physical Exam Vitals and nursing note reviewed.  Constitutional:  Appearance: Normal appearance.  Cardiovascular:     Rate and Rhythm: Normal rate and regular rhythm.     Pulses: Normal pulses.     Heart sounds: Normal heart sounds.  Pulmonary:     Effort: Pulmonary effort is normal.     Breath sounds: Normal breath sounds.  Abdominal:     General: Abdomen is flat. Bowel sounds are normal.     Palpations: Abdomen is soft.  Skin:    General: Skin is warm and dry.  Neurological:     General: No focal deficit present.     Mental Status: She is alert and oriented to person, place, and time.  Psychiatric:        Mood and Affect: Mood normal.        Behavior: Behavior normal.        Thought Content: Thought content normal.        Judgment: Judgment normal.       Assessment & Plan:  1. Prediabetes  - Hemoglobin A1c; Future - Basic Metabolic Panel; Future  2. Diarrhea, unspecified type - Advised she can use 1/2 tab of imodium every 12 hours as needed - She did not want to see GI at this time  - Hemoglobin A1c; Future - Basic Metabolic Panel; Future  Shirline Frees, NP

## 2022-08-17 NOTE — Telephone Encounter (Signed)
Pt had an appointment at the office, saw Shirline Frees on 08/12/22 for this issue

## 2022-08-19 ENCOUNTER — Other Ambulatory Visit: Payer: Self-pay | Admitting: Cardiovascular Disease

## 2022-08-19 DIAGNOSIS — I4819 Other persistent atrial fibrillation: Secondary | ICD-10-CM

## 2022-09-09 ENCOUNTER — Other Ambulatory Visit: Payer: Self-pay | Admitting: Cardiovascular Disease

## 2022-09-15 DIAGNOSIS — H401134 Primary open-angle glaucoma, bilateral, indeterminate stage: Secondary | ICD-10-CM | POA: Diagnosis not present

## 2022-09-25 ENCOUNTER — Other Ambulatory Visit: Payer: Self-pay | Admitting: Family Medicine

## 2022-09-28 LAB — CUP PACEART REMOTE DEVICE CHECK
Battery Remaining Longevity: 123 mo
Battery Voltage: 3.01 V
Brady Statistic AP VP Percent: 0 %
Brady Statistic AP VS Percent: 0 %
Brady Statistic AS VP Percent: 99.95 %
Brady Statistic AS VS Percent: 0.05 %
Brady Statistic RA Percent Paced: 0 %
Brady Statistic RV Percent Paced: 99.95 %
Date Time Interrogation Session: 20240605201348
Implantable Lead Connection Status: 753985
Implantable Lead Connection Status: 753985
Implantable Lead Implant Date: 20211206
Implantable Lead Implant Date: 20211206
Implantable Lead Location: 753858
Implantable Lead Location: 753860
Implantable Lead Model: 3830
Implantable Lead Model: 5076
Implantable Pulse Generator Implant Date: 20211206
Lead Channel Impedance Value: 323 Ohm
Lead Channel Impedance Value: 380 Ohm
Lead Channel Impedance Value: 380 Ohm
Lead Channel Impedance Value: 494 Ohm
Lead Channel Pacing Threshold Amplitude: 0.875 V
Lead Channel Pacing Threshold Pulse Width: 0.4 ms
Lead Channel Sensing Intrinsic Amplitude: 6.25 mV
Lead Channel Sensing Intrinsic Amplitude: 6.25 mV
Lead Channel Sensing Intrinsic Amplitude: 6.625 mV
Lead Channel Sensing Intrinsic Amplitude: 6.625 mV
Lead Channel Setting Pacing Amplitude: 2 V
Lead Channel Setting Pacing Pulse Width: 0.4 ms
Lead Channel Setting Sensing Sensitivity: 2 mV
Zone Setting Status: 755011

## 2022-09-29 ENCOUNTER — Ambulatory Visit (INDEPENDENT_AMBULATORY_CARE_PROVIDER_SITE_OTHER): Payer: Medicare Other

## 2022-09-29 DIAGNOSIS — I4430 Unspecified atrioventricular block: Secondary | ICD-10-CM | POA: Diagnosis not present

## 2022-10-04 ENCOUNTER — Other Ambulatory Visit: Payer: Self-pay | Admitting: Cardiovascular Disease

## 2022-10-12 ENCOUNTER — Other Ambulatory Visit: Payer: Self-pay | Admitting: Cardiovascular Disease

## 2022-10-15 ENCOUNTER — Other Ambulatory Visit: Payer: Self-pay | Admitting: Internal Medicine

## 2022-10-15 DIAGNOSIS — I4821 Permanent atrial fibrillation: Secondary | ICD-10-CM

## 2022-10-15 DIAGNOSIS — I480 Paroxysmal atrial fibrillation: Secondary | ICD-10-CM

## 2022-10-17 NOTE — Telephone Encounter (Signed)
Pradaxa 150mg  refill request received. Pt is 82 years old, weight-47.6kg, Crea-0.64 on 08/12/22, last seen by Dr. Graciela Husbands on 04/22/22, Diagnosis-Afib, CrCl-50.05 mL/min; Dose is appropriate based on dosing criteria. Will send in refill to requested pharmacy.

## 2022-10-21 NOTE — Progress Notes (Signed)
Remote pacemaker transmission.   

## 2022-10-24 ENCOUNTER — Other Ambulatory Visit: Payer: Self-pay | Admitting: Family Medicine

## 2022-10-24 DIAGNOSIS — I10 Essential (primary) hypertension: Secondary | ICD-10-CM

## 2022-11-02 ENCOUNTER — Encounter: Payer: Self-pay | Admitting: Adult Health

## 2022-11-02 ENCOUNTER — Ambulatory Visit: Payer: Medicare Other | Admitting: Adult Health

## 2022-11-02 VITALS — BP 117/77 | HR 70 | Ht 65.0 in | Wt 107.4 lb

## 2022-11-02 DIAGNOSIS — Z8673 Personal history of transient ischemic attack (TIA), and cerebral infarction without residual deficits: Secondary | ICD-10-CM

## 2022-11-02 DIAGNOSIS — I639 Cerebral infarction, unspecified: Secondary | ICD-10-CM | POA: Diagnosis not present

## 2022-11-02 DIAGNOSIS — R569 Unspecified convulsions: Secondary | ICD-10-CM | POA: Diagnosis not present

## 2022-11-02 DIAGNOSIS — I69398 Other sequelae of cerebral infarction: Secondary | ICD-10-CM | POA: Diagnosis not present

## 2022-11-02 DIAGNOSIS — G4730 Sleep apnea, unspecified: Secondary | ICD-10-CM | POA: Diagnosis not present

## 2022-11-02 NOTE — Progress Notes (Signed)
[facility] [address] [place]. [place] [address] [phone]       OFFICE FOLLOW-UP NOTE  [name] Date of Birth:  04-Nov-1940 Medical Record Number:  [id number]   Reason for visit: Stroke follow-up    HPI:   Mary Mercado is a 82 y.o. 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  History of severe sleep apnea on ASV therapy followed by Dr. Frances Furbish.  Returns today for routine follow-up.   Update 11/02/2022 JM: She is accompanied today by her husband.  Patient has been stable since prior visit.  Denies new stroke/TIA symptoms.  Reports continued left-sided zap type sensations which are unchanged since prior visit.  Continues to follow with PCP and cardiology for stroke risk factor management and compliant on stroke prevention medications.  Denies any seizure activity on Keppra 500 mg twice daily, denies side effects.  Patient remains compliant on ASV therapy and tolerating well with download report over the past 30 days showing 100% compliance and residual AHI 3.8 with low leak at 1.2 and 95th percentile on EPAP of 8, minimum pressure support of 3 and maximum pressure support of 12.  No questions or concerns regarding ASV therapy.  Routinely follows with DME company and up to date on supplies.    History provided for reference purposes only Update 03/09/2022 JM: Patient returns for 51-month stroke follow-up accompanied by her husband.  Overall stable without new stroke/TIA symptoms.  Residual deficits stable.  Remains on Keppra 500 mg twice daily, denies any seizure activity.  Remains on Pradaxa and atorvastatin.  Blood pressure well  controlled.  Was last seen by Dr. Frances Furbish 7/3 for sleep apnea with plans on 1 year follow-up.  Does report continued nightly use of CPAP.  Does still have some issues with anxiety but greatly improved since prior visit, has not yet further discussed with PCP.  She does mention being scheduled for cataract removal on 12/14 and requesting surgical clearance to proceed.  No new concerns at this time.  Update 09/01/2021 JM: Previously seen 6 months ago accompanied by her husband.  Overall stable without new stroke/TIA symptoms.  Residual deficits stable without worsening. Continues to experience significant anxiety - has not further discussed with PCP. She has been participating in yoga twice weekly which she believes has been helpful.  Compliant on Pradaxa and atorvastatin, denies side effects.  Blood pressure today 149/87.  Occasionally monitors  at home and typically stable.  No additional witnessed seizure activity.  Attempted use of Keppra XR due to concerning symptoms on IR formulation, she decided to return back to IR formulation due to concern of XR capsule not dissolving (found in BM) and c/o intermittent numbness since change to XR.  She is currently on Keppra 500 mg twice daily without specific side effects.  Continues to follow with Dr. Frances Furbish for OSA on BiPAP reporting nightly usage.  No new concerns at this time.  Update 03/10/2021 JM: Returns for 28-month stroke and seizure follow-up accompanied by her husband  Stable since prior visit -denies new stroke/TIA symptoms or seizure activity Reports continued imbalance and left hand and foot occasional numbness which has been present since her prior strokes.  Continues to ambulate  with RW -denies any recent falls. She does report episodes typically occurring midday and evening consisting of feeling shaky/trembling, and just feeling generally "bad" and will improve after sitting for a minute.  No other associated symptoms such as increased weakness, altered  awareness/confusion, loss of consciousness, headache or speech changes.  At first, she reported this has been persistent since her stroke but then mentioned it has become more frequent since her seizure. She does believe it usually occurs with increased fatigue or anxiety. This was previously discussed at prior visit almost 2 years ago (see OV 04/2019).  She becomes very anxious with increased L>R UE tremor while discussing this.   Compliant on Keppra 500 mg twice daily  Compliant on Pradaxa and atorvastatin 80 mg daily -denies side effects Blood pressure today 151/84 - occasionally monitors at home - has been running 140s/90s - cardiology is aware  Continues to follow with Dr. Frances Furbish for sleep apnea monitoring and management  No further concerns at this time   Update 09/07/2020 Dr. Pearlean Brownie; Patient is seen today accompanied by her husband following recent hospital admission for seizures in March 2022.  She is seen today for a new problem of seizures.  Patient developed sudden onset of speech difficulties on 06/28/2020 with left upper extremity twitching's.  During transport by EMS she also complained of right-sided headache.  Upon arrival in the ER her twitchings resolved after treatment with IV lorazepam except for mild left thumb twitching.  She was loaded with IV Keppra and the involuntary movements stopped.  EEG showed focal right-sided slowing and MRI scan was negative for acute infarct LDL cholesterol was 71 mg percent and home statin dose was continued.  Hemoglobin A1c was 6.4.  Patient was on Pradaxa 150 mg twice daily which was continued.  Patient states she is tolerating Keppra well without any dizziness or other side effects.  She has had no recurrent seizures or twitching's.  She has had no recurrent stroke or TIA symptoms either.  She is tolerating Pradaxa well without bruising or bleeding.  She is seeing Dr. Frances Furbish for her sleep apnea and has been using her CPAP every night.  Blood pressure is  normally well controlled at home the today it is elevated in office at 150/86.  She has no new complaints.  She has no prior history of seizures, significant head injury with loss of consciousness.  But she does have history of multiple strokes in the past2/2020 admitted for left-sided numbness weakness, status post TPA, MRI showed right frontal parietal infarct.  CTA head neck showed right P2, left VA, bilateral ICA siphon severe stenosis.  EF 60 to 65%.  Negative for DVT, TEE negative.  LDL 117, A1c 5.9.  Discharged with DAPT and Lipitor 40 30-day CardioNet monitoring showed A. fib patient started on Eliquis. 12/2019  Admitted for right cerebellar infarct.  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  EF 60 to 65%, LDL 81, A1c 5.9.  Eliquis changed to Pradaxa, continue Lipitor 80.   Update 06/03/2020 JM: Ms. Wandersee returns for 49-month stroke follow-up.  She has been stable from a stroke standpoint since prior visit without new stroke/TIA symptoms and reports residual gait unsteadiness with imbalance and dysarthria. She continues to use rolling walker and denies any recent falls. She also reports chronic left leg and torso numbness with occasional burning sensation which is chronic and denies worsening. She has remained on Pradaxa and atorvastatin 80 mg daily  without side effects.  Blood pressure today 147/86.  She has routinely followed with cardiology for continued dizziness which she reports is in relation to blood pressure and cardiology currently working on adjustments.  She was evaluated by Dr. Frances Furbish and was found to have severe sleep apnea. Recommended titration study but patient requested holding off until further recovery from undergoing ablation for persistent A. 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 She is not ready to proceed with titration study. No further concerns at this time.  Initial follow-up 02/03/2020 JM: She was discharged home  from CIR on 01/22/2020 after an 8-day stay.  Since discharge, she reports residual " whooshy head" sensation, gait unsteadiness and mild dysarthria but reports ongoing improvement.  Recommended outpatient PT/OT/SLP at discharge from CIR but referral was not placed at discharge.  She has continued to do exercises at home as recommended during CIR stay.  Continues to use Rollator walker for ambulation and denies any recent falls.  Denies vertigo or dizziness sensation.  Denies new or worsening stroke/TIA symptoms.  Prior stroke deficit of left-sided paresthesias greatly improving and only experience occasional numbness/tingling.  Remains on Pradaxa without bleeding or bruising.  Remains on atorvastatin 80 mg daily without myalgias.  Blood pressure today 122/76.  Monitors at home which has been stable.  She does report urinary frequency over the past week and is concerned for possible UTI but has been increasing water intake and drinking cranberry juice.  Denies fever, dysuria or hematuria.  Typically, nocturia only on occasion but does report snoring, daytime naps and occasional insomnia.  She has not previously underwent sleep study.  No further concerns at this time.  Stroke admission 01/09/2020 Presented to ER with slurred speech, facial droop vertigo and ataxia while at her cardiologist office the morning of 01/09/2020.  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  Switched anticoagulation to Pradaxa.  HTN stable.  LDL 81 and increase atorvastatin from 40 mg to 80 mg daily.  Other stroke risk factors include advanced age and history of right MCA stroke in 05/2018.  Evaluated by therapy with residual dysarthria, mild memory deficits, vestibular symptoms and imbalance affecting ADLs and mobility therefore discharged to CIR with functional decline.  Stroke:   R cerebellar infarct embolic secondary to known AF on Eliquis Code Stroke CT head No acute  abnormality. Old infarct R frontoparietal cortex. Small vessel disease. ASPECTS 10.    CTA head & neck no LVO. B ICA bifurcation atherosclerosis, supraclinoid B ICA atherosclerosis. L VA origin moderate to severe stenosis. R P2 moderate stenosis.  MRI  R superior cerebellar territory infarct. Evolution of prior R MCA infarcts from last year. Small vessel disease.  2D Echo EF 60-65%. No source of embolus. LA severely dilated. LDL 81 HgbA1c 5.9 VTE prophylaxis - Lovenox 40 mg sq daily  Eliquis (apixaban) daily prior to admission, now on aspirin 81 mg daily.   Switched to Pradaxa Therapy recommendations:   CIR Disposition:   CIR     Update 08/01/2019 JM: Ms. Mole is a 82 year old female who is being seen today, 08/01/2019, for stroke follow-up with residual left-sided paresthesias and post stroke anxiety accompanied by her husband.  Initiated Cymbalta at prior visit due to ongoing paresthesias and severe anxiety but unfortunately caused hypertension therefore advised to discontinue. Paresthesias have been stable from prior visit without worsening with intermittent symptoms that occur randomly.  She does continue to have occasional balance difficulties and if  ambulating or standing for too long she will start to feel "off".  Referral placed at prior visit to PT but apparently has not been called to schedule initial evaluation.  She continues to experience anxiety which has only been present for stroke but does feel slight improvement since prior visit.  Remains on Eliquis and atorvastatin without side effects.  Blood pressure today 132/74.  No further concerns at this time.  Update 05/02/2019: Ms. Hugh is a 82 year old female who is being seen today for stroke follow-up accompanied by her husband.  Residual deficits left-sided paresthesias consisting of numbness/tingling and occasional burning sensation.  She also endorses episodes of sensation of full body "intermittently shaking" and is usually  worsened by stress, anxiety or fatigue.  Usually accompanied by nausea but denies headache or dizziness.  She becomes fearful during those times that she may be having a heart attack.  When questioned regarding anxiety, she does endorse minimal anxiety but after doing GAD-7 test, score of 20/21 showing severe anxiety.  She denies any prior history or family history of depression or anxiety.  She continues on Eliquis without bleeding or bruising.  She continues on atorvastatin without myalgias.  Blood pressure today 140/81.  She denies any additional episodes of dizziness and continues to follow with cardiology for blood pressure management.  She has recently decreased telmisartan as it is recommended to continue blood pressure between 130-150 due to vertebral artery stenosis.  No further concerns at this time.   Update 11/19/2018 Dr. Pearlean Brownie: She is seen today for office follow-up visit following initial video follow-up visit on 08/13/2018.  She is accompanied by her husband.  She states she continues to have left leg paresthesias and numbness.  She has some mild gait and balance difficulties.  At times she noticed that the legs are trembling and she is initiating walk and she has to hold on something and the feeling goes away.  She has also been started on several new cardiac medications per her ablation for A. fib and feels medication may be the cause for her dizziness.  She is tolerating Eliquis well without bruising or bleeding.  Blood pressures well controlled and today it is 136/79.  She remains on Lipitor which is tolerating well without muscle aches and pains.  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  Initial video visit 08/13/2018 Dr. Pearlean Brownie : This is a initial video virtual consultation visit on Ms. Shams who was admitted to [facility] in February 2020 with a stroke.  I have personally obtained history of  presenting illness from the patient and her husband and reviewed electronic medical records as well as imaging films in PACS.  She was admitted on 05/31/2018 with sudden onset of left arm and leg weakness and numbness as well as feeling dizzy and had one episode of emesis.  She had CT scan and CT angiogram in the ER which showed no large vessel occlusion she was given IV TPA and admitted to the neurological intensive care unit.  NIH stroke scale on admission was 4.  At baseline modified Rankin score was 0.  Patient had tight blood pressure control.  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  Transthoracic echo showed normal ejection fraction.  Transesophageal echocardiogram showed no cardiac source of embolism or PFO.  LDL cholesterol was elevated at 1 1 7  mg percent.  Hemoglobin A1c was 5.9.  Urine drug screen was negative.  Lower extremity venous Dopplers were negative for DVT.  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  Patient was started on aspirin 81 and Plavix 75 mg daily for 3 weeks and underwent an outpatient 30-day heart monitor which subsequently showed paroxysmal atrial fibrillation.  She has since then stopped aspirin and Plavix and has been switched to Eliquis.  Patient states she has done well since discharge.  She was initially transferred to inpatient rehab where she stayed for a few weeks and did well.  She is still has left-sided numbness but feels some of the sensation may be coming back now.  She can walk independently without assistance though her husband stays close by.  She has had no falls or injuries.  She is tolerating Eliquis well without bleeding or bruising.  She is also tolerating Lipitor well.  She complains of little  bit of jitteriness and tiredness but she blames this likely on her new A. fib medication.  She has no new complaints.  She has no prior history of strokes TIAs.        ROS:   14 system review of systems is positive for those listed in HPI and all other systems negative  PMH:  Past Medical History:  Diagnosis Date   Allergy    CVA (cerebral vascular accident) (HCC) 05/2018, 01/09/20   Hyperlipidemia LDL goal <70    Hypertension    Palpitations    Vertigo, benign positional     Social History:  Social History   Socioeconomic History   Marital status: Married    Spouse name: Interior and spatial designer   Number of children: Not on file   Years of education: 12+   Highest education level: Not on file  Occupational History    Comment: retired  Tobacco Use   Smoking status: Never   Smokeless tobacco: Never  Vaping Use   Vaping Use: Never used  Substance and Sexual Activity   Alcohol use: Never   Drug use: Never   Sexual activity: Not on file  Other Topics Concern   Not on file  Social History Narrative   Lives with husband   caffeine none daily   Right Handed   Social Determinants of Health   Financial Resource Strain: Low Risk  (05/27/2022)   Overall Financial Resource Strain (CARDIA)    Difficulty of Paying Living Expenses: Not hard at all  Food Insecurity: No Food Insecurity (05/27/2022)   Hunger Vital Sign    Worried About Running Out of Food in the Last Year: Never true    Ran Out of Food in the Last Year: Never true  Transportation Needs: No Transportation Needs (05/27/2022)   PRAPARE - Administrator, Civil Service (Medical): No    Lack of Transportation (Non-Medical): No  Physical Activity: Insufficiently Active (05/27/2022)   Exercise Vital Sign    Days of Exercise per Week: 2 days    Minutes of Exercise per Session: 50 min  Stress: No Stress Concern Present (05/27/2022)   [employer] of Occupational Health - Occupational Stress Questionnaire    Feeling of Stress  : Not at all  Social Connections: Socially Integrated (05/27/2022)   Social Connection and Isolation Panel [NHANES]    Frequency of Communication with Friends and Family:  More than three times a week    Frequency of Social Gatherings with Friends and Family: More than three times a week    Attends Religious Services: More than 4 times per year    Active Member of Clubs or Organizations: Yes    Attends Banker Meetings: More than 4 times per year    Marital Status: Married  Catering manager Violence: Not At Risk (05/27/2022)   Humiliation, Afraid, Rape, and Kick questionnaire    Fear of Current or Ex-Partner: No    Emotionally Abused: No    Physically Abused: No    Sexually Abused: No    Medications:   Current Outpatient Medications on File Prior to Visit  Medication Sig Dispense Refill   amLODipine (NORVASC) 2.5 MG tablet TAKE 1 TABLET BY MOUTH EVERY DAY 90 tablet 1   atorvastatin (LIPITOR) 80 MG tablet TAKE 1 TABLET BY MOUTH DAILY AT 6 PM. 90 tablet 1   dabigatran (PRADAXA) 150 MG CAPS capsule Take 1 capsule (150 mg total) by mouth 2 (two) times daily. 180 capsule 1   dorzolamide-timolol (COSOPT) 2-0.5 % ophthalmic solution 1 drop 2 (two) times daily.     levETIRAcetam (KEPPRA) 500 MG tablet Take 1 tablet (500 mg total) by mouth 2 (two) times daily. 180 tablet 3   losartan (COZAAR) 50 MG tablet TAKE 2 TABLETS BY MOUTH EVERY DAY 180 tablet 0   metoprolol tartrate (LOPRESSOR) 25 MG tablet TAKE 1/2 TABLET BY MOUTH EVERY DAY AT BEDTIME 45 tablet 2   Tafluprost, PF, 0.0015 % SOLN Apply 1 drop to eye daily.     brimonidine (ALPHAGAN) 0.2 % ophthalmic solution Place 1 drop into both eyes 2 times daily. (Patient not taking: Reported on 11/02/2022)     dorzolamide (TRUSOPT) 2 % ophthalmic solution 1 drop 2 (two) times daily. (Patient not taking: Reported on 11/02/2022)     Current Facility-Administered Medications on File Prior to Visit  Medication Dose Route Frequency Provider Last  Rate Last Admin   regadenoson (LEXISCAN) injection SOLN 0.4 mg  0.4 mg Intravenous Once Jodelle Red, MD       technetium tetrofosmin (TC-MYOVIEW) injection 32 millicurie  32 millicurie Intravenous Once PRN Jodelle Red, MD        Allergies:   Allergies  Allergen Reactions   Duloxetine     Causes Bp elevation.  Increased body jerks, nausea   Acetaminophen Hives and Rash    Physical Exam  Today's Vitals   11/02/22 1319  BP: 117/77  Pulse: 70  Weight: 107 lb 6.4 oz (48.7 kg)  Height: 5\' 5"  (1.651 m)   Body mass index is 17.87 kg/m.   General: frail very pleasant elderly Caucasian lady seated, with mild anxiety Head: head normocephalic and atraumatic.  Neck: supple with no carotid or supraclavicular bruits Cardiovascular: regular rate and rhythm, no murmurs Musculoskeletal: no deformity Skin:  no rash/petichiae Vascular:  Normal pulses all extremities  Neurologic Exam Mental Status: Awake and fully alert.  Mild dysarthria with occasional speech hesitancy and word finding difficulty. Oriented to place and time. Recent and remote memory intact. Attention span, concentration and fund of knowledge appropriate. Mood and affect appropriate Cranial Nerves: Pupils equal, briskly reactive to light. Extraocular movements full without nystagmus. Visual fields full to confrontation. Hearing intact. Facial sensation intact. Face, tongue, palate moves normally and symmetrically.  Motor: Normal bulk and tone. Normal strength in all tested extremity muscles except mild left hip flexor weakness and decreased left hand finger dexterity  Sensory.:  Intact light touch, vibratory and pinprick sensation Coordination: Rapid alternating movements normal in all extremities except decreased left hand. Finger-to-nose and heel-to-shin performed accurately bilaterally.  Mild action tremors bilateral upper extremities.  No tremors at rest or evidence of cogwheel rigidity or bradykinesia Gait  and Station: Arises from chair without difficulty. Stance is normal. Gait demonstrates normal stride length and mild unsteadiness/imbalance with use of Rollator walker. Reflexes: 1+ and symmetric. Toes downgoing.       ASSESSMENT/PLAN: 82 year old Caucasian lady with recent embolic right cerebellar infarct secondary to known AF on Eliquis on 01/09/2020 and history of right MCA infarct in February 2020 secondary to paroxysmal atrial fibrillation which was found on outpatient cardiac event monitor after discharge.  Vascular risk factors of hypertension, hyperlipidemia, A. 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 Fib Hx of R MCA stroke -Residual deficit: Left-sided numbness/tingling, gait impairment with imbalance and mild dysarthria -stable  -Continue Pradaxa and atorvastatin 80 mg daily for secondary stroke prevention managed by PCP/cardiology -Discussed secondary stroke prevention measures and importance of close PCP and cardiology follow-up for aggressive stroke risk factor management including HTN with BP<130/90 and HLD with LDL goal<70. -Continue close follow-up with cardiology for atrial fibrillation and Pradaxa management  Seizures, late effect of stroke -continue keppra 500 mg twice daily -refill provided -No additional seizure activity -Routine lab work by PCP  Severe sleep apnea -Compliant on ASV therapy -Discussed importance of continued nightly usage and continue greater than 4 hours per night for optimal benefit -Continue to follow with [employer] for any needed supplies or ASV related concerns    Follow-up in 1 year or call earlier if needed    CC:  Nelwyn Salisbury, MD     I spent 34 minutes of face-to-face and  non-face-to-face time with patient and husband.  This included previsit chart review, lab review, study review, electronic health record documentation, patient and husband education and discussion regarding above diagnoses and treatment plan and answered all the questions to patient and husband satisfaction  [name], AGNP-BC  [facility] [address] [place], [place] [address]  Phone [phone] Fax [phone] Note: This document was prepared with digital dictation and possible smart phrase technology. Any transcriptional errors that result from this process are unintentional.

## 2022-11-02 NOTE — Patient Instructions (Addendum)
It was good to see you again today!   Continue current treatment regimen - no changes today  Continue nightly use of ASV treatment for sleep apnea management    Follow up in 1 year or call earlier if needed

## 2022-11-16 DIAGNOSIS — H401134 Primary open-angle glaucoma, bilateral, indeterminate stage: Secondary | ICD-10-CM | POA: Diagnosis not present

## 2022-12-06 DIAGNOSIS — L570 Actinic keratosis: Secondary | ICD-10-CM | POA: Diagnosis not present

## 2022-12-06 DIAGNOSIS — L905 Scar conditions and fibrosis of skin: Secondary | ICD-10-CM | POA: Diagnosis not present

## 2022-12-06 DIAGNOSIS — L820 Inflamed seborrheic keratosis: Secondary | ICD-10-CM | POA: Diagnosis not present

## 2022-12-06 DIAGNOSIS — D485 Neoplasm of uncertain behavior of skin: Secondary | ICD-10-CM | POA: Diagnosis not present

## 2022-12-06 DIAGNOSIS — D0462 Carcinoma in situ of skin of left upper limb, including shoulder: Secondary | ICD-10-CM | POA: Diagnosis not present

## 2022-12-15 DIAGNOSIS — H401134 Primary open-angle glaucoma, bilateral, indeterminate stage: Secondary | ICD-10-CM | POA: Diagnosis not present

## 2022-12-20 DIAGNOSIS — H6121 Impacted cerumen, right ear: Secondary | ICD-10-CM | POA: Diagnosis not present

## 2022-12-29 ENCOUNTER — Other Ambulatory Visit: Payer: Self-pay | Admitting: Family Medicine

## 2022-12-29 ENCOUNTER — Ambulatory Visit (INDEPENDENT_AMBULATORY_CARE_PROVIDER_SITE_OTHER): Payer: Medicare Other

## 2022-12-29 DIAGNOSIS — I4430 Unspecified atrioventricular block: Secondary | ICD-10-CM

## 2022-12-29 LAB — CUP PACEART REMOTE DEVICE CHECK
Battery Remaining Longevity: 121 mo
Battery Voltage: 3.01 V
Brady Statistic AP VP Percent: 0 %
Brady Statistic AP VS Percent: 0 %
Brady Statistic AS VP Percent: 99.97 %
Brady Statistic AS VS Percent: 0.03 %
Brady Statistic RA Percent Paced: 0 %
Brady Statistic RV Percent Paced: 99.97 %
Date Time Interrogation Session: 20240904234740
Implantable Lead Connection Status: 753985
Implantable Lead Connection Status: 753985
Implantable Lead Implant Date: 20211206
Implantable Lead Implant Date: 20211206
Implantable Lead Location: 753858
Implantable Lead Location: 753860
Implantable Lead Model: 3830
Implantable Lead Model: 5076
Implantable Pulse Generator Implant Date: 20211206
Lead Channel Impedance Value: 323 Ohm
Lead Channel Impedance Value: 380 Ohm
Lead Channel Impedance Value: 399 Ohm
Lead Channel Impedance Value: 513 Ohm
Lead Channel Pacing Threshold Amplitude: 0.875 V
Lead Channel Pacing Threshold Pulse Width: 0.4 ms
Lead Channel Sensing Intrinsic Amplitude: 12.875 mV
Lead Channel Sensing Intrinsic Amplitude: 12.875 mV
Lead Channel Sensing Intrinsic Amplitude: 6.625 mV
Lead Channel Sensing Intrinsic Amplitude: 6.625 mV
Lead Channel Setting Pacing Amplitude: 2 V
Lead Channel Setting Pacing Pulse Width: 0.4 ms
Lead Channel Setting Sensing Sensitivity: 2 mV
Zone Setting Status: 755011

## 2023-01-03 DIAGNOSIS — L570 Actinic keratosis: Secondary | ICD-10-CM | POA: Diagnosis not present

## 2023-01-03 DIAGNOSIS — D485 Neoplasm of uncertain behavior of skin: Secondary | ICD-10-CM | POA: Diagnosis not present

## 2023-01-03 DIAGNOSIS — D0462 Carcinoma in situ of skin of left upper limb, including shoulder: Secondary | ICD-10-CM | POA: Diagnosis not present

## 2023-01-06 NOTE — Progress Notes (Signed)
Remote pacemaker transmission.   

## 2023-01-13 ENCOUNTER — Other Ambulatory Visit: Payer: Self-pay | Admitting: Cardiovascular Disease

## 2023-01-19 ENCOUNTER — Encounter: Payer: Self-pay | Admitting: Family Medicine

## 2023-01-19 ENCOUNTER — Telehealth: Payer: Medicare Other | Admitting: Family Medicine

## 2023-01-19 ENCOUNTER — Telehealth: Payer: Self-pay | Admitting: Family Medicine

## 2023-01-19 DIAGNOSIS — U071 COVID-19: Secondary | ICD-10-CM | POA: Diagnosis not present

## 2023-01-19 MED ORDER — NIRMATRELVIR/RITONAVIR (PAXLOVID)TABLET: 3.0000 | ORAL_TABLET | Freq: Two times a day (BID) | ORAL | 0 refills | Status: DC

## 2023-01-19 NOTE — Telephone Encounter (Signed)
Pt picked up rx for nirmatrelvir/ritonavir (PAXLOVID) 20 x 150 MG & 10 x 100MG  TABS, was advised by pharmacist to only take her dabigatran (PRADAXA) 150 MG CAPS capsule once a day while taking the paxlovid. Seeking guidance on how to take meds

## 2023-01-19 NOTE — Progress Notes (Signed)
Subjective:    Patient ID: Mary Mercado, female    DOB: 1940-07-24, 82 y.o.   MRN: [id number]  HPI Virtual Visit via Video Note  I connected with the patient on 01/19/23 at  8:30 AM EDT by a video enabled telemedicine application and verified that I am speaking with the correct person using two identifiers.  Location patient: home Location provider:work or home office Persons participating in the virtual visit: patient, provider  I discussed the limitations of evaluation and management by telemedicine and the availability of in person appointments. The patient expressed understanding and agreed to proceed.   HPI: Here for 4 days of body aches, headache, a dry cough, and diarrhea. No fever. She is drinking fluids and taking Imodium. She tested positive for Covid yesterday.   ROS: See pertinent positives and negatives per HPI.  Past Medical History:  Diagnosis Date   Allergy    CVA (cerebral vascular accident) (HCC) 05/2018, 01/09/20   Hyperlipidemia LDL goal <70    Hypertension    Palpitations    Vertigo, benign positional     Past Surgical History:  Procedure Laterality Date   AV NODE ABLATION N/A 03/30/2020   Procedure: AV NODE ABLATION;  Surgeon: Duke Salvia, MD;  Location: [facility] INVASIVE CV LAB;  Service: Cardiovascular;  Laterality: N/A;   AV NODE ABLATION N/A 03/30/2020   Procedure: AV NODE ABLATION;  Surgeon: Duke Salvia, MD;  Location: [facility] INVASIVE CV LAB;  Service: Cardiovascular;  Laterality: N/A;   CARDIOVERSION Left 10/31/2018   Procedure: CARDIOVERSION;  Surgeon: Elease Hashimoto Deloris Ping, MD;  Location: [facility] ENDOSCOPY;  Service: Cardiovascular;  Laterality: Left;   CARDIOVERSION N/A 01/08/2020   Procedure: CARDIOVERSION;  Surgeon: Parke Poisson, MD;  Location: [facility] ENDOSCOPY;  Service: Cardiovascular;  Laterality: N/A;   Hemilaminectomy and microdiskectomy at L4-5 on the left.  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   Procedure: PACEMAKER IMPLANT;   Surgeon: Duke Salvia, MD;  Location: [facility] INVASIVE CV LAB;  Service: Cardiovascular;  Laterality: N/A;   TEE WITHOUT CARDIOVERSION N/A 06/05/2018   Procedure: TRANSESOPHAGEAL ECHOCARDIOGRAM (TEE);  Surgeon: Lewayne Bunting, MD;  Location: [facility] ENDOSCOPY;  Service: Cardiovascular;  Laterality: N/A;  loop    Family History  Problem Relation Age of Onset   Diabetes Mother    Heart attack Mother        49   Colon cancer Father    Heart attack Brother 82   Sleep apnea Neg Hx      Current Outpatient Medications:    amLODipine (NORVASC) 2.5 MG tablet, TAKE 1 TABLET BY MOUTH EVERY DAY, Disp: 90 tablet, Rfl: 0   atorvastatin (LIPITOR) 80 MG tablet, TAKE 1 TABLET BY MOUTH DAILY AT 6 PM., Disp: 90 tablet, Rfl: 1   dabigatran (PRADAXA) 150 MG CAPS capsule, Take 1 capsule (150 mg total) by mouth 2 (two) times daily., Disp: 180 capsule, Rfl: 1   dorzolamide (TRUSOPT) 2 % ophthalmic solution, 1 drop 2 (two) times daily., Disp: , Rfl:    dorzolamide-timolol (COSOPT) 2-0.5 % ophthalmic solution, 1 drop 2 (two) times daily., Disp: , Rfl:    levETIRAcetam (KEPPRA) 500 MG tablet, Take 1 tablet (500 mg total) by mouth 2 (two) times daily., Disp: 180 tablet, Rfl: 3   losartan (COZAAR) 50 MG tablet, TAKE 2 TABLETS BY MOUTH EVERY DAY, Disp: 180 tablet, Rfl: 0   metoprolol tartrate (LOPRESSOR) 25 MG tablet, TAKE 1/2 TABLET BY MOUTH EVERY DAY AT BEDTIME, Disp: 45  tablet, Rfl: 2   Tafluprost, PF, 0.0015 % SOLN, Apply 1 drop to eye daily., Disp: , Rfl:  No current facility-administered medications for this visit.  Facility-Administered Medications Ordered in Other Visits:    regadenoson (LEXISCAN) injection SOLN 0.4 mg, 0.4 mg, Intravenous, Once, Jodelle Red, MD   technetium tetrofosmin (TC-MYOVIEW) injection 32 millicurie, 32 millicurie, Intravenous, Once PRN, Jodelle Red, MD  EXAM:  VITALS per patient if applicable:  GENERAL: alert, oriented, appears well and in no acute  distress  HEENT: atraumatic, conjunttiva clear, no obvious abnormalities on inspection of external nose and ears  NECK: normal movements of the head and neck  LUNGS: on inspection no signs of respiratory distress, breathing rate appears normal, no obvious gross SOB, gasping or wheezing  CV: no obvious cyanosis  MS: moves all visible extremities without noticeable abnormality  PSYCH/NEURO: pleasant and cooperative, no obvious depression or anxiety, speech and thought processing grossly intact  ASSESSMENT AND PLAN: Covid infection, treat with 5 days of Paxlovid. Gershon Crane, MD  Discussed the following assessment and plan:  No diagnosis found.     I discussed the assessment and treatment plan with the patient. The patient was provided an opportunity to ask questions and all were answered. The patient agreed with the plan and demonstrated an understanding of the instructions.   The patient was advised to call back or seek an in-person evaluation if the symptoms worsen or if the condition fails to improve as anticipated.      Review of Systems     Objective:   Physical Exam        Assessment & Plan:

## 2023-01-20 ENCOUNTER — Other Ambulatory Visit: Payer: Self-pay | Admitting: Family Medicine

## 2023-01-20 DIAGNOSIS — I10 Essential (primary) hypertension: Secondary | ICD-10-CM

## 2023-01-20 NOTE — Telephone Encounter (Signed)
That sounds like good advice. Take the Pradaxa only once a day while she is taking Paxlovid. After that resume normal dosing

## 2023-01-20 NOTE — Telephone Encounter (Signed)
FYI Spoke with pt advised of Dr Clent Ridges recommendations, verbalized understanding. Pt wanted to let Dr Clent Ridges know that her husband tested Negative for COVID this morning

## 2023-01-24 ENCOUNTER — Inpatient Hospital Stay (HOSPITAL_COMMUNITY): Payer: Medicare Other

## 2023-01-24 ENCOUNTER — Inpatient Hospital Stay (HOSPITAL_COMMUNITY): Payer: Medicare Other | Admitting: Anesthesiology

## 2023-01-24 ENCOUNTER — Emergency Department (HOSPITAL_COMMUNITY): Payer: Medicare Other

## 2023-01-24 ENCOUNTER — Other Ambulatory Visit: Payer: Self-pay

## 2023-01-24 ENCOUNTER — Inpatient Hospital Stay (HOSPITAL_COMMUNITY)
Admission: EM | Admit: 2023-01-24 | Discharge: 2023-01-30 | DRG: 023 | Disposition: A | Discharge status: Rehabilitation Facility | Payer: Medicare Other | Attending: Internal Medicine | Admitting: Internal Medicine

## 2023-01-24 ENCOUNTER — Encounter (HOSPITAL_COMMUNITY)
Admission: EM | Disposition: A | Discharge status: Rehabilitation Facility | Payer: Self-pay | Source: Home / Self Care | Attending: Neurology

## 2023-01-24 DIAGNOSIS — R0789 Other chest pain: Secondary | ICD-10-CM | POA: Diagnosis not present

## 2023-01-24 DIAGNOSIS — Z23 Encounter for immunization: Secondary | ICD-10-CM

## 2023-01-24 DIAGNOSIS — I63511 Cerebral infarction due to unspecified occlusion or stenosis of right middle cerebral artery: Secondary | ICD-10-CM | POA: Diagnosis not present

## 2023-01-24 DIAGNOSIS — I482 Chronic atrial fibrillation, unspecified: Secondary | ICD-10-CM | POA: Diagnosis present

## 2023-01-24 DIAGNOSIS — I69354 Hemiplegia and hemiparesis following cerebral infarction affecting left non-dominant side: Secondary | ICD-10-CM

## 2023-01-24 DIAGNOSIS — R4781 Slurred speech: Secondary | ICD-10-CM | POA: Diagnosis present

## 2023-01-24 DIAGNOSIS — Z7901 Long term (current) use of anticoagulants: Secondary | ICD-10-CM

## 2023-01-24 DIAGNOSIS — R29707 NIHSS score 7: Secondary | ICD-10-CM | POA: Diagnosis present

## 2023-01-24 DIAGNOSIS — R1310 Dysphagia, unspecified: Secondary | ICD-10-CM | POA: Diagnosis not present

## 2023-01-24 DIAGNOSIS — R600 Localized edema: Secondary | ICD-10-CM | POA: Diagnosis not present

## 2023-01-24 DIAGNOSIS — I6389 Other cerebral infarction: Secondary | ICD-10-CM | POA: Diagnosis not present

## 2023-01-24 DIAGNOSIS — J9601 Acute respiratory failure with hypoxia: Secondary | ICD-10-CM | POA: Diagnosis not present

## 2023-01-24 DIAGNOSIS — I340 Nonrheumatic mitral (valve) insufficiency: Secondary | ICD-10-CM | POA: Diagnosis not present

## 2023-01-24 DIAGNOSIS — I10 Essential (primary) hypertension: Secondary | ICD-10-CM | POA: Diagnosis not present

## 2023-01-24 DIAGNOSIS — I6522 Occlusion and stenosis of left carotid artery: Secondary | ICD-10-CM | POA: Diagnosis not present

## 2023-01-24 DIAGNOSIS — Z7902 Long term (current) use of antithrombotics/antiplatelets: Secondary | ICD-10-CM | POA: Diagnosis not present

## 2023-01-24 DIAGNOSIS — R490 Dysphonia: Secondary | ICD-10-CM | POA: Diagnosis present

## 2023-01-24 DIAGNOSIS — R4182 Altered mental status, unspecified: Secondary | ICD-10-CM | POA: Diagnosis not present

## 2023-01-24 DIAGNOSIS — I639 Cerebral infarction, unspecified: Secondary | ICD-10-CM | POA: Diagnosis not present

## 2023-01-24 DIAGNOSIS — R54 Age-related physical debility: Secondary | ICD-10-CM | POA: Diagnosis present

## 2023-01-24 DIAGNOSIS — R29810 Facial weakness: Secondary | ICD-10-CM | POA: Diagnosis not present

## 2023-01-24 DIAGNOSIS — H409 Unspecified glaucoma: Secondary | ICD-10-CM | POA: Diagnosis not present

## 2023-01-24 DIAGNOSIS — G459 Transient cerebral ischemic attack, unspecified: Secondary | ICD-10-CM | POA: Diagnosis not present

## 2023-01-24 DIAGNOSIS — Z8249 Family history of ischemic heart disease and other diseases of the circulatory system: Secondary | ICD-10-CM | POA: Diagnosis not present

## 2023-01-24 DIAGNOSIS — F4320 Adjustment disorder, unspecified: Secondary | ICD-10-CM | POA: Diagnosis not present

## 2023-01-24 DIAGNOSIS — E162 Hypoglycemia, unspecified: Secondary | ICD-10-CM | POA: Diagnosis present

## 2023-01-24 DIAGNOSIS — J9600 Acute respiratory failure, unspecified whether with hypoxia or hypercapnia: Secondary | ICD-10-CM | POA: Diagnosis present

## 2023-01-24 DIAGNOSIS — U071 COVID-19: Secondary | ICD-10-CM | POA: Diagnosis present

## 2023-01-24 DIAGNOSIS — G40909 Epilepsy, unspecified, not intractable, without status epilepticus: Secondary | ICD-10-CM | POA: Diagnosis not present

## 2023-01-24 DIAGNOSIS — Z833 Family history of diabetes mellitus: Secondary | ICD-10-CM | POA: Diagnosis not present

## 2023-01-24 DIAGNOSIS — I69391 Dysphagia following cerebral infarction: Secondary | ICD-10-CM | POA: Diagnosis not present

## 2023-01-24 DIAGNOSIS — I6782 Cerebral ischemia: Secondary | ICD-10-CM | POA: Diagnosis not present

## 2023-01-24 DIAGNOSIS — Z79899 Other long term (current) drug therapy: Secondary | ICD-10-CM

## 2023-01-24 DIAGNOSIS — E785 Hyperlipidemia, unspecified: Secondary | ICD-10-CM | POA: Diagnosis not present

## 2023-01-24 DIAGNOSIS — J1282 Pneumonia due to coronavirus disease 2019: Secondary | ICD-10-CM | POA: Diagnosis present

## 2023-01-24 DIAGNOSIS — Z4682 Encounter for fitting and adjustment of non-vascular catheter: Secondary | ICD-10-CM | POA: Diagnosis not present

## 2023-01-24 DIAGNOSIS — I63441 Cerebral infarction due to embolism of right cerebellar artery: Secondary | ICD-10-CM

## 2023-01-24 DIAGNOSIS — E876 Hypokalemia: Secondary | ICD-10-CM | POA: Diagnosis present

## 2023-01-24 DIAGNOSIS — R454 Irritability and anger: Secondary | ICD-10-CM | POA: Diagnosis not present

## 2023-01-24 DIAGNOSIS — Z888 Allergy status to other drugs, medicaments and biological substances status: Secondary | ICD-10-CM | POA: Diagnosis not present

## 2023-01-24 DIAGNOSIS — Z8616 Personal history of COVID-19: Secondary | ICD-10-CM | POA: Diagnosis not present

## 2023-01-24 DIAGNOSIS — I4891 Unspecified atrial fibrillation: Secondary | ICD-10-CM | POA: Diagnosis not present

## 2023-01-24 DIAGNOSIS — H8110 Benign paroxysmal vertigo, unspecified ear: Secondary | ICD-10-CM | POA: Diagnosis present

## 2023-01-24 DIAGNOSIS — Z950 Presence of cardiac pacemaker: Secondary | ICD-10-CM | POA: Diagnosis not present

## 2023-01-24 DIAGNOSIS — R000 Tachycardia, unspecified: Secondary | ICD-10-CM | POA: Diagnosis not present

## 2023-01-24 DIAGNOSIS — Z886 Allergy status to analgesic agent status: Secondary | ICD-10-CM

## 2023-01-24 DIAGNOSIS — I480 Paroxysmal atrial fibrillation: Secondary | ICD-10-CM | POA: Diagnosis present

## 2023-01-24 DIAGNOSIS — R42 Dizziness and giddiness: Secondary | ICD-10-CM

## 2023-01-24 DIAGNOSIS — D689 Coagulation defect, unspecified: Secondary | ICD-10-CM | POA: Diagnosis present

## 2023-01-24 DIAGNOSIS — Z800 Family history of malignant neoplasm of digestive organs: Secondary | ICD-10-CM

## 2023-01-24 DIAGNOSIS — R471 Dysarthria and anarthria: Secondary | ICD-10-CM | POA: Diagnosis present

## 2023-01-24 DIAGNOSIS — Z8669 Personal history of other diseases of the nervous system and sense organs: Secondary | ICD-10-CM | POA: Diagnosis not present

## 2023-01-24 DIAGNOSIS — F4322 Adjustment disorder with anxiety: Secondary | ICD-10-CM | POA: Diagnosis not present

## 2023-01-24 DIAGNOSIS — I69318 Other symptoms and signs involving cognitive functions following cerebral infarction: Secondary | ICD-10-CM | POA: Diagnosis not present

## 2023-01-24 DIAGNOSIS — I672 Cerebral atherosclerosis: Secondary | ICD-10-CM | POA: Diagnosis not present

## 2023-01-24 DIAGNOSIS — I351 Nonrheumatic aortic (valve) insufficiency: Secondary | ICD-10-CM | POA: Diagnosis not present

## 2023-01-24 DIAGNOSIS — R918 Other nonspecific abnormal finding of lung field: Secondary | ICD-10-CM | POA: Diagnosis not present

## 2023-01-24 HISTORY — PX: RADIOLOGY WITH ANESTHESIA: SHX6223

## 2023-01-24 HISTORY — DX: Presence of cardiac pacemaker: Z95.0

## 2023-01-24 HISTORY — PX: IR US GUIDE VASC ACCESS RIGHT: IMG2390

## 2023-01-24 HISTORY — PX: IR CT HEAD LTD: IMG2386

## 2023-01-24 HISTORY — PX: IR PERCUTANEOUS ART THROMBECTOMY/INFUSION INTRACRANIAL INC DIAG ANGIO: IMG6087

## 2023-01-24 LAB — I-STAT CHEM 8, ED
BUN: 9 mg/dL (ref 8–23)
Calcium, Ion: 1.06 mmol/L — ABNORMAL LOW (ref 1.15–1.40)
Chloride: 104 mmol/L (ref 98–111)
Creatinine, Ser: 0.4 mg/dL — ABNORMAL LOW (ref 0.44–1.00)
Glucose, Bld: 136 mg/dL — ABNORMAL HIGH (ref 70–99)
HCT: 43 % (ref 36.0–46.0)
Hemoglobin: 14.6 g/dL (ref 12.0–15.0)
Potassium: 3.8 mmol/L (ref 3.5–5.1)
Sodium: 140 mmol/L (ref 135–145)
TCO2: 22 mmol/L (ref 22–32)

## 2023-01-24 LAB — POCT I-STAT 7, (LYTES, BLD GAS, ICA,H+H)
Acid-Base Excess: 0 mmol/L (ref 0.0–2.0)
Bicarbonate: 25.2 mmol/L (ref 20.0–28.0)
Calcium, Ion: 1.16 mmol/L (ref 1.15–1.40)
HCT: 41 % (ref 36.0–46.0)
Hemoglobin: 13.9 g/dL (ref 12.0–15.0)
O2 Saturation: 100 %
Patient temperature: 92.7
Potassium: 3.6 mmol/L (ref 3.5–5.1)
Sodium: 136 mmol/L (ref 135–145)
TCO2: 26 mmol/L (ref 22–32)
pCO2 arterial: 35.1 mm[Hg] (ref 32–48)
pH, Arterial: 7.45 (ref 7.35–7.45)
pO2, Arterial: 470 mm[Hg] — ABNORMAL HIGH (ref 83–108)

## 2023-01-24 LAB — COMPREHENSIVE METABOLIC PANEL
ALT: 21 U/L (ref 0–44)
AST: 29 U/L (ref 15–41)
Albumin: 3.2 g/dL — ABNORMAL LOW (ref 3.5–5.0)
Alkaline Phosphatase: 80 U/L (ref 38–126)
Anion gap: 12 (ref 5–15)
BUN: 5 mg/dL — ABNORMAL LOW (ref 8–23)
CO2: 24 mmol/L (ref 22–32)
Calcium: 8.7 mg/dL — ABNORMAL LOW (ref 8.9–10.3)
Chloride: 102 mmol/L (ref 98–111)
Creatinine, Ser: 0.57 mg/dL (ref 0.44–1.00)
GFR, Estimated: >60 mL/min (ref >60)
Glucose, Bld: 109 mg/dL — ABNORMAL HIGH (ref 70–99)
Potassium: 3.5 mmol/L (ref 3.5–5.1)
Sodium: 138 mmol/L (ref 135–145)
Total Bilirubin: 1.1 mg/dL (ref 0.3–1.2)
Total Protein: 6.5 g/dL (ref 6.5–8.1)

## 2023-01-24 LAB — GLUCOSE, CAPILLARY
Glucose-Capillary: 175 mg/dL — ABNORMAL HIGH (ref 70–99)
Glucose-Capillary: 136 mg/dL — ABNORMAL HIGH (ref 70–99)
Glucose-Capillary: 123 mg/dL — ABNORMAL HIGH (ref 70–99)
Glucose-Capillary: 94 mg/dL (ref 70–99)
Glucose-Capillary: 103 mg/dL — ABNORMAL HIGH (ref 70–99)

## 2023-01-24 LAB — DIFFERENTIAL
Abs Immature Granulocytes: 0.02 K/uL (ref 0.00–0.07)
Basophils Absolute: 0 K/uL (ref 0.0–0.1)
Basophils Relative: 1 %
Eosinophils Absolute: 0 K/uL (ref 0.0–0.5)
Eosinophils Relative: 0 %
Immature Granulocytes: 0 %
Lymphocytes Relative: 12 %
Lymphs Abs: 0.9 K/uL (ref 0.7–4.0)
Monocytes Absolute: 0.7 K/uL (ref 0.1–1.0)
Monocytes Relative: 10 %
Neutro Abs: 5.8 K/uL (ref 1.7–7.7)
Neutrophils Relative %: 77 %

## 2023-01-24 LAB — PROTIME-INR
INR: 1.1 (ref 0.8–1.2)
Prothrombin Time: 14.3 s (ref 11.4–15.2)

## 2023-01-24 LAB — CBC
HCT: 43.2 % (ref 36.0–46.0)
Hemoglobin: 14.6 g/dL (ref 12.0–15.0)
MCH: 30.8 pg (ref 26.0–34.0)
MCHC: 33.8 g/dL (ref 30.0–36.0)
MCV: 91.1 fL (ref 80.0–100.0)
Platelets: 198 K/uL (ref 150–400)
RBC: 4.74 MIL/uL (ref 3.87–5.11)
RDW: 12.7 % (ref 11.5–15.5)
WBC: 7.6 K/uL (ref 4.0–10.5)
nRBC: 0 % (ref 0.0–0.2)

## 2023-01-24 LAB — MRSA NEXT GEN BY PCR, NASAL: MRSA by PCR Next Gen: NOT DETECTED

## 2023-01-24 LAB — I-STAT CG4 LACTIC ACID, ED: Lactic Acid, Venous: 1.7 mmol/L (ref 0.5–1.9)

## 2023-01-24 LAB — CBG MONITORING, ED
Glucose-Capillary: 166 mg/dL — ABNORMAL HIGH (ref 70–99)
Glucose-Capillary: <10 mg/dL — CL (ref 70–99)

## 2023-01-24 LAB — APTT: aPTT: 26 s (ref 24–36)

## 2023-01-24 LAB — ETHANOL: Alcohol, Ethyl (B): <10 mg/dL

## 2023-01-24 LAB — SARS CORONAVIRUS 2 BY RT PCR: SARS Coronavirus 2 by RT PCR: POSITIVE — AB

## 2023-01-24 SURGERY — IR WITH ANESTHESIA
Anesthesia: General

## 2023-01-24 MED ORDER — SODIUM CHLORIDE 0.9 % IV SOLN
INTRAVENOUS | Status: DC
  Administered 2023-01-24: 75 mL/h via INTRAVENOUS

## 2023-01-24 MED ORDER — PROPOFOL 1000 MG/100ML IV EMUL
0.0000 ug/kg/min | INTRAVENOUS | Status: DC
  Administered 2023-01-24: 40 ug/kg/min via INTRAVENOUS
  Filled 2023-01-24: qty 100

## 2023-01-24 MED ORDER — FENTANYL CITRATE PF 50 MCG/ML IJ SOSY: 25.0000 ug | PREFILLED_SYRINGE | INTRAMUSCULAR | Status: DC | PRN

## 2023-01-24 MED ORDER — PROPOFOL 10 MG/ML IV BOLUS
INTRAVENOUS | Status: DC | PRN
Start: 2023-01-24 — End: 2023-01-24
  Administered 2023-01-24: 80 mg via INTRAVENOUS

## 2023-01-24 MED ORDER — CLEVIDIPINE BUTYRATE 0.5 MG/ML IV EMUL
INTRAVENOUS | Status: DC | PRN
Start: 2023-01-24 — End: 2023-01-24
  Administered 2023-01-24: 2 mg/h via INTRAVENOUS

## 2023-01-24 MED ORDER — VERAPAMIL HCL 2.5 MG/ML IV SOLN
INTRAVENOUS | Status: AC
  Filled 2023-01-24: qty 2

## 2023-01-24 MED ORDER — NOREPINEPHRINE 4 MG/250ML-% IV SOLN: 2.0000 ug/min | INTRAVENOUS | Status: DC

## 2023-01-24 MED ORDER — LEVETIRACETAM IN NACL 500 MG/100ML IV SOLN
500.0000 mg | Freq: Two times a day (BID) | INTRAVENOUS | Status: DC
  Administered 2023-01-24 (×2): 500 mg via INTRAVENOUS
  Filled 2023-01-24 (×2): qty 100

## 2023-01-24 MED ORDER — ATORVASTATIN CALCIUM 80 MG PO TABS
80.0000 mg | ORAL_TABLET | Freq: Every day | ORAL | Status: DC
  Administered 2023-01-24 – 2023-01-25 (×2): 80 mg
  Filled 2023-01-24: qty 1

## 2023-01-24 MED ORDER — ONDANSETRON HCL 4 MG/2ML IJ SOLN
INTRAMUSCULAR | Status: DC | PRN
  Administered 2023-01-24: 4 mg via INTRAVENOUS

## 2023-01-24 MED ORDER — ROCURONIUM BROMIDE 10 MG/ML (PF) SYRINGE
PREFILLED_SYRINGE | INTRAVENOUS | Status: DC | PRN
  Administered 2023-01-24: 40 mg via INTRAVENOUS

## 2023-01-24 MED ORDER — SENNOSIDES-DOCUSATE SODIUM 8.6-50 MG PO TABS: 1.0000 | ORAL_TABLET | Freq: Every evening | ORAL | Status: DC | PRN

## 2023-01-24 MED ORDER — ATORVASTATIN CALCIUM 80 MG PO TABS
80.0000 mg | ORAL_TABLET | Freq: Every day | ORAL | Status: DC
  Filled 2023-01-24: qty 1

## 2023-01-24 MED ORDER — CLEVIDIPINE BUTYRATE 0.5 MG/ML IV EMUL: 0.0000 mg/h | INTRAVENOUS | Status: DC

## 2023-01-24 MED ORDER — SODIUM CHLORIDE 0.9 % IV BOLUS: 250.0000 mL | INTRAVENOUS | Status: DC | PRN

## 2023-01-24 MED ORDER — PHENYLEPHRINE 80 MCG/ML (10ML) SYRINGE FOR IV PUSH (FOR BLOOD PRESSURE SUPPORT)
PREFILLED_SYRINGE | INTRAVENOUS | Status: DC | PRN
  Administered 2023-01-24: 160 ug via INTRAVENOUS

## 2023-01-24 MED ORDER — CLEVIDIPINE BUTYRATE 0.5 MG/ML IV EMUL
0.0000 mg/h | INTRAVENOUS | Status: DC
  Filled 2023-01-24: qty 100

## 2023-01-24 MED ORDER — FAMOTIDINE 20 MG PO TABS
20.0000 mg | ORAL_TABLET | Freq: Two times a day (BID) | ORAL | Status: DC
  Administered 2023-01-24: 20 mg
  Filled 2023-01-24: qty 1

## 2023-01-24 MED ORDER — LATANOPROST 0.005 % OP SOLN
1.0000 [drp] | Freq: Every day | OPHTHALMIC | Status: DC
  Administered 2023-01-24 – 2023-01-29 (×6): 1 [drp] via OPHTHALMIC
  Filled 2023-01-24: qty 2.5

## 2023-01-24 MED ORDER — SODIUM CHLORIDE 0.9% FLUSH
3.0000 mL | Freq: Once | INTRAVENOUS | Status: AC
  Administered 2023-01-24: 3 mL via INTRAVENOUS

## 2023-01-24 MED ORDER — STROKE: EARLY STAGES OF RECOVERY BOOK
Freq: Once | Status: AC
  Filled 2023-01-24: qty 1

## 2023-01-24 MED ORDER — POLYETHYLENE GLYCOL 3350 17 G PO PACK: 17.0000 g | PACK | Freq: Every day | ORAL | Status: DC | PRN

## 2023-01-24 MED ORDER — DOCUSATE SODIUM 50 MG/5ML PO LIQD: 100.0000 mg | Freq: Two times a day (BID) | ORAL | Status: DC | PRN

## 2023-01-24 MED ORDER — SODIUM CHLORIDE 0.9 % IV SOLN: 250.0000 mL | INTRAVENOUS | Status: DC

## 2023-01-24 MED ORDER — ATORVASTATIN CALCIUM 80 MG PO TABS: 80.0000 mg | ORAL_TABLET | Freq: Every day | ORAL | Status: DC

## 2023-01-24 MED ORDER — DORZOLAMIDE HCL-TIMOLOL MAL 2-0.5 % OP SOLN
1.0000 [drp] | Freq: Two times a day (BID) | OPHTHALMIC | Status: DC
  Administered 2023-01-24 – 2023-01-30 (×13): 1 [drp] via OPHTHALMIC
  Filled 2023-01-24: qty 10

## 2023-01-24 MED ORDER — IOHEXOL 350 MG/ML SOLN
100.0000 mL | Freq: Once | INTRAVENOUS | Status: AC | PRN
  Administered 2023-01-24: 100 mL via INTRAVENOUS

## 2023-01-24 MED ORDER — FAMOTIDINE 20 MG PO TABS
20.0000 mg | ORAL_TABLET | Freq: Every day | ORAL | Status: DC
  Administered 2023-01-25: 20 mg
  Filled 2023-01-24: qty 1

## 2023-01-24 MED ORDER — INFLUENZA VAC A&B SURF ANT ADJ 0.5 ML IM SUSY
0.5000 mL | PREFILLED_SYRINGE | INTRAMUSCULAR | Status: AC
  Administered 2023-01-25: 0.5 mL via INTRAMUSCULAR
  Filled 2023-01-24: qty 0.5

## 2023-01-24 MED ORDER — LACTATED RINGERS IV SOLN
INTRAVENOUS | Status: DC | PRN
Start: 2023-01-24 — End: 2023-01-24

## 2023-01-24 MED ORDER — PROPOFOL 500 MG/50ML IV EMUL
INTRAVENOUS | Status: DC | PRN
Start: 2023-01-24 — End: 2023-01-24
  Administered 2023-01-24: 30 ug/kg/min via INTRAVENOUS

## 2023-01-24 MED ORDER — LIDOCAINE 2% (20 MG/ML) 5 ML SYRINGE
INTRAMUSCULAR | Status: DC | PRN
  Administered 2023-01-24: 60 mg via INTRAVENOUS

## 2023-01-24 MED ORDER — CEFAZOLIN SODIUM-DEXTROSE 2-4 GM/100ML-% IV SOLN
INTRAVENOUS | Status: AC
  Filled 2023-01-24: qty 100

## 2023-01-24 MED ORDER — PHENYLEPHRINE HCL-NACL 20-0.9 MG/250ML-% IV SOLN
INTRAVENOUS | Status: DC | PRN
  Administered 2023-01-24: 40 ug/min via INTRAVENOUS

## 2023-01-24 MED ORDER — DOCUSATE SODIUM 50 MG/5ML PO LIQD
100.0000 mg | Freq: Two times a day (BID) | ORAL | Status: DC
  Administered 2023-01-24: 100 mg
  Filled 2023-01-24: qty 10

## 2023-01-24 MED ORDER — IOHEXOL 300 MG/ML  SOLN
150.0000 mL | Freq: Once | INTRAMUSCULAR | Status: AC | PRN
  Administered 2023-01-24: 45 mL via INTRA_ARTERIAL

## 2023-01-24 MED ORDER — CEFAZOLIN SODIUM-DEXTROSE 2-3 GM-%(50ML) IV SOLR
INTRAVENOUS | Status: DC | PRN
Start: 2023-01-24 — End: 2023-01-24
  Administered 2023-01-24: 2 g via INTRAVENOUS

## 2023-01-24 MED ORDER — SUCCINYLCHOLINE CHLORIDE 200 MG/10ML IV SOSY
PREFILLED_SYRINGE | INTRAVENOUS | Status: DC | PRN
  Administered 2023-01-24: 80 mg via INTRAVENOUS

## 2023-01-24 MED ORDER — POLYETHYLENE GLYCOL 3350 17 G PO PACK
17.0000 g | PACK | Freq: Every day | ORAL | Status: DC
  Administered 2023-01-24: 17 g
  Filled 2023-01-24: qty 1

## 2023-01-24 NOTE — Progress Notes (Signed)
Per pt's husband, Pt was told to only take "half of her Pradaxa" while on Paxlovid. As a result, she has only taken her Pradaxa once per day for the past five days. Medical and neurology teams notified.

## 2023-01-24 NOTE — Progress Notes (Addendum)
STROKE TEAM PROGRESS NOTE   BRIEF HPI [name] is a 82 y.o. female with hx of stroke x3, HLD, HTN, PAF on Pradaxa who presented to emergency department 01/24/23 morning as code stroke. Left sided weakness, facial droop, and speech change were noted at admission. LKW: 2230, 01/23/23. LVO+. COVID + 9/22. Not given TNK.  Right M1 occlusion with large penumbra. Transferred to ICU, post IR. Currently sedated and intubated.   SIGNIFICANT HOSPITAL EVENTS - 01/24/23. Admitted via ED, IR intervention.   INTERIM HISTORY/SUBJECTIVE Per husband: Her baseline is that she was fully independent, managed own care. Last week came down with COVID, treated with PAXLOVID. Told that there was an interaction with Pradaxa, was taking half her pradaxa while on PAXLOVID per their facility provider's recommendations. Was well last night at bedtime but woke up not normal. He reported she was slurring speech.    OBJECTIVE  CBC    Component Value Date/Time   WBC 4.8 03/15/2022 0709   RBC 4.83 03/15/2022 0709   HGB 13.9 01/24/2023 1102   HGB 13.9 01/28/2021 1020   HCT 41.0 01/24/2023 1102   HCT 43.4 01/28/2021 1020   PLT 153.0 03/15/2022 0709   PLT 254 01/28/2021 1020   MCV 91.5 03/15/2022 0709   MCV 90 01/28/2021 1020   MCH 29.0 01/28/2021 1020   MCH 29.3 06/29/2020 0323   MCHC 33.9 03/15/2022 0709   RDW 13.6 03/15/2022 0709   RDW 12.5 01/28/2021 1020   LYMPHSABS 1.5 03/15/2022 0709   MONOABS 0.4 03/15/2022 0709   EOSABS 0.3 03/15/2022 0709   BASOSABS 0.1 03/15/2022 0709    BMET    Component Value Date/Time   NA 136 01/24/2023 1102   NA 143 09/21/2021 1600   K 3.6 01/24/2023 1102   CL 104 01/24/2023 0643   CO2 29 08/12/2022 1341   GLUCOSE 136 (H) 01/24/2023 0643   BUN 9 01/24/2023 0643   BUN 14 09/21/2021 1600   CREATININE 0.40 (L) 01/24/2023 0643   CREATININE 0.66 11/14/2019 1102   CALCIUM 9.4 08/12/2022 1341   EGFR 66 09/21/2021 1600   GFRNONAA >60 06/29/2020 0323     IMAGING past 24 hours DG Abd Portable 1V  Result Date: 01/24/2023 CLINICAL DATA:  Orogastric tube placement EXAM: PORTABLE ABDOMEN - 1 VIEW COMPARISON:  CT 02/01/2011 FINDINGS: Gastric tube extends into the decompressed stomach. Visualized bowel gas pattern unremarkable. visualized lung bases clear. No abnormal abdominal calcifications. Lower abdomen excluded. Regional bones unremarkable. IMPRESSION: Gastric tube into decompressed stomach. Electronically Signed   By: Corlis Leak M.D.   On: 01/24/2023 12:24   DG Chest Port 1 View  Result Date: 01/24/2023 CLINICAL DATA:  Intubation EXAM: PORTABLE CHEST - 1 VIEW COMPARISON:  03/31/2020 FINDINGS: Endotracheal tube tip 6.5 cm above carina. Coarse attenuated bronchovascular markings with interstitial opacities in both upper lobes. 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 Heart size and mediastinal contours are within normal limits. Aortic Atherosclerosis (ICD10-170.0). No effusion. Visualized bones unremarkable. IMPRESSION: 1. Endotracheal tube tip 6.5 cm above carina. 2. Bilateral upper lobe interstitial opacities. Electronically Signed   By: Corlis Leak M.D.   On: 01/24/2023 12:17   IR PERCUTANEOUS ART THROMBECTOMY/INFUSION INTRACRANIAL INC DIAG ANGIO  Result Date: 01/24/2023 INDICATION: 82 year old female presenting with acute left-sided weakness, left facial droop and slurred speech; NIHSS 7. Her last known well was 10:30 p.m. on 01/23/2023. Her past medical history is significant for prior strokes with mild residual left-sided weakness, hypertension, hyperlipidemia, paroxysmal atrial fibrillation  on Pradaxa having failed Eliquis, recently tested positive for COVID on 01/15/2023. Premorbid modified Rankin scale 2. Head CT showed no acute findings. CT angiogram of the head and neck showed a right M1/MCA occlusion. She was transferred to our service for mechanical thrombectomy. EXAM: ULTRASOUND-GUIDED VASCULAR ACCESS DIAGNOSTIC CEREBRAL  ANGIOGRAM MECHANICAL THROMBECTOMY FLAT PANEL HEAD CT COMPARISON:  CT/CT angiogram of the head and neck 01/24/2023. MEDICATIONS: Ancef 2 g IV. ANESTHESIA/SEDATION: The procedure was performed under general anesthesia. CONTRAST:  45 mL of Omnipaque 300 milligram/mL FLUOROSCOPY: Radiation Exposure Index (as provided by the fluoroscopic device): 355 mGy Kerma COMPLICATIONS: None immediate. TECHNIQUE: Informed written consent was obtained from the patient's husband after a thorough discussion of the procedural risks, benefits and alternatives. All questions were addressed. Maximal Sterile Barrier Technique was utilized including caps, mask, sterile gowns, sterile gloves, sterile drape, hand hygiene and skin antiseptic. A timeout was performed prior to the initiation of the procedure. The right groin was prepped and draped in the usual sterile fashion. Using a micropuncture kit and the modified Seldinger technique, access was gained to the right common femoral artery and an 8 French sheath was placed. Real-time ultrasound guidance was utilized for vascular access including the acquisition of a permanent ultrasound image documenting patency of the accessed vessel. Under fluoroscopy, a Zoom 88 guide catheter was navigated over a 6 [place] VTK catheter and a 0.035" Terumo Glidewire into the aortic arch. The catheter was placed into the right common carotid artery and then advanced into the right internal carotid artery. The diagnostic catheter was removed. Frontal and lateral angiograms of the head were obtained. FINDINGS: 1. Ultrasound showed mild atherosclerotic changes of the right common femoral artery without significant stenosis. The caliber is adequate for vascular access. 2. Occlusion of the right M1/MCA with moderate leptomeningeal collateral flow from the right ACA to the right MCA territory. 3. Mild luminal irregularity of the right carotid siphon related to atherosclerotic disease without hemodynamically  significant stenosis. A 2-3 mm outpouching from the communicating segment of the right ICA corresponds to a PCOM infundibulum. PROCEDURE: Using biplane roadmap guidance, a Cereglide 71 aspiration catheter was navigated over Colossus 35 microguidewire into the cavernous segment of the right ICA. The aspiration catheter was then advanced to the level of occlusion and connected to an aspiration pump. Continuous aspiration was performed for 2 minutes. The guide catheter was connected to a VacLok syringe. The aspiration catheter was subsequently removed under constant aspiration. The guide catheter was aspirated for debris. Right internal carotid artery angiograms with frontal lateral views of the head showed complete recanalization of the right MCA vascular tree (TICI 3). The catheter was then retracted into the right common carotid artery. Frontal and lateral angiograms of the neck were obtained showing atherosclerotic disease of the right carotid bifurcation without hemodynamically significant stenosis. Flat panel CT of the head was obtained and post processed in a separate workstation with concurrent attending physician supervision. Selected images were sent to PACS. No hemorrhagic complication. The catheter was subsequently withdrawn. Right common femoral artery angiogram was obtained in right anterior oblique view. The puncture is at the level of the common femoral artery. The artery has normal caliber, adequate for closure device. The sheath was exchanged over the wire for a Perclose prostyle which was utilized for access closure. Immediate hemostasis was achieved. IMPRESSION: Successful and uncomplicated mechanical thrombectomy for treatment of a right M1/MCA occlusion achieving complete recanalization (TICI 3). PLAN: Transfer to ICU. Electronically Signed   By: Seymour Bars  Melchor Amour M.D.   On: 01/24/2023 12:07   IR CT Head Ltd  Result Date: 01/24/2023 INDICATION: 82 year old female presenting with  acute left-sided weakness, left facial droop and slurred speech; NIHSS 7. Her last known well was 10:30 p.m. on 01/23/2023. Her past medical history is significant for prior strokes with mild residual left-sided weakness, hypertension, hyperlipidemia, paroxysmal atrial fibrillation on Pradaxa having failed Eliquis, recently tested positive for COVID on 01/15/2023. Premorbid modified Rankin scale 2. Head CT showed no acute findings. CT angiogram of the head and neck showed a right M1/MCA occlusion. She was transferred to our service for mechanical thrombectomy. EXAM: ULTRASOUND-GUIDED VASCULAR ACCESS DIAGNOSTIC CEREBRAL ANGIOGRAM MECHANICAL THROMBECTOMY FLAT PANEL HEAD CT COMPARISON:  CT/CT angiogram of the head and neck 01/24/2023. MEDICATIONS: Ancef 2 g IV. ANESTHESIA/SEDATION: The procedure was performed under general anesthesia. CONTRAST:  45 mL of Omnipaque 300 milligram/mL FLUOROSCOPY: Radiation Exposure Index (as provided by the fluoroscopic device): 355 mGy Kerma COMPLICATIONS: None immediate. TECHNIQUE: Informed written consent was obtained from the patient's husband after a thorough discussion of the procedural risks, benefits and alternatives. All questions were addressed. Maximal Sterile Barrier Technique was utilized including caps, mask, sterile gowns, sterile gloves, sterile drape, hand hygiene and skin antiseptic. A timeout was performed prior to the initiation of the procedure. The right groin was prepped and draped in the usual sterile fashion. Using a micropuncture kit and the modified Seldinger technique, access was gained to the right common femoral artery and an 8 French sheath was placed. Real-time ultrasound guidance was utilized for vascular access including the acquisition of a permanent ultrasound image documenting patency of the accessed vessel. Under fluoroscopy, a Zoom 88 guide catheter was navigated over a 6 [place] VTK catheter and a 0.035" Terumo Glidewire into the aortic arch. The  catheter was placed into the right common carotid artery and then advanced into the right internal carotid artery. The diagnostic catheter was removed. Frontal and lateral angiograms of the head were obtained. FINDINGS: 1. Ultrasound showed mild atherosclerotic changes of the right common femoral artery without significant stenosis. The caliber is adequate for vascular access. 2. Occlusion of the right M1/MCA with moderate leptomeningeal collateral flow from the right ACA to the right MCA territory. 3. Mild luminal irregularity of the right carotid siphon related to atherosclerotic disease without hemodynamically significant stenosis. A 2-3 mm outpouching from the communicating segment of the right ICA corresponds to a PCOM infundibulum. PROCEDURE: Using biplane roadmap guidance, a Cereglide 71 aspiration catheter was navigated over Colossus 35 microguidewire into the cavernous segment of the right ICA. The aspiration catheter was then advanced to the level of occlusion and connected to an aspiration pump. Continuous aspiration was performed for 2 minutes. The guide catheter was connected to a VacLok syringe. The aspiration catheter was subsequently removed under constant aspiration. The guide catheter was aspirated for debris. Right internal carotid artery angiograms with frontal lateral views of the head showed complete recanalization of the right MCA vascular tree (TICI 3). The catheter was then retracted into the right common carotid artery. Frontal and lateral angiograms of the neck were obtained showing atherosclerotic disease of the right carotid bifurcation without hemodynamically significant stenosis. Flat panel CT of the head was obtained and post processed in a separate workstation with concurrent attending physician supervision. Selected images were sent to PACS. No hemorrhagic complication. The catheter was subsequently withdrawn. Right common femoral artery angiogram was obtained in right anterior  oblique view. The puncture is at the level of the  common femoral artery. The artery has normal caliber, adequate for closure device. The sheath was exchanged over the wire for a Perclose prostyle which was utilized for access closure. Immediate hemostasis was achieved. IMPRESSION: Successful and uncomplicated mechanical thrombectomy for treatment of a right M1/MCA occlusion achieving complete recanalization (TICI 3). PLAN: Transfer to ICU. Electronically Signed   By: Baldemar Lenis M.D.   On: 01/24/2023 12:07   IR US Guide Vasc Access Right  Result Date: 01/24/2023 INDICATION: 82 year old female presenting with acute left-sided weakness, left facial droop and slurred speech; NIHSS 7. Her last known well was 10:30 p.m. on 01/23/2023. Her past medical history is significant for prior strokes with mild residual left-sided weakness, hypertension, hyperlipidemia, paroxysmal atrial fibrillation on Pradaxa having failed Eliquis, recently tested positive for COVID on 01/15/2023. Premorbid modified Rankin scale 2. Head CT showed no acute findings. CT angiogram of the head and neck showed a right M1/MCA occlusion. She was transferred to our service for mechanical thrombectomy. EXAM: ULTRASOUND-GUIDED VASCULAR ACCESS DIAGNOSTIC CEREBRAL ANGIOGRAM MECHANICAL THROMBECTOMY FLAT PANEL HEAD CT COMPARISON:  CT/CT angiogram of the head and neck 01/24/2023. MEDICATIONS: Ancef 2 g IV. ANESTHESIA/SEDATION: The procedure was performed under general anesthesia. CONTRAST:  45 mL of Omnipaque 300 milligram/mL FLUOROSCOPY: Radiation Exposure Index (as provided by the fluoroscopic device): 355 mGy Kerma COMPLICATIONS: None immediate. TECHNIQUE: Informed written consent was obtained from the patient's husband after a thorough discussion of the procedural risks, benefits and alternatives. All questions were addressed. Maximal Sterile Barrier Technique was utilized including caps, mask, sterile gowns, sterile gloves, sterile  drape, hand hygiene and skin antiseptic. A timeout was performed prior to the initiation of the procedure. The right groin was prepped and draped in the usual sterile fashion. Using a micropuncture kit and the modified Seldinger technique, access was gained to the right common femoral artery and an 8 French sheath was placed. Real-time ultrasound guidance was utilized for vascular access including the acquisition of a permanent ultrasound image documenting patency of the accessed vessel. Under fluoroscopy, a Zoom 88 guide catheter was navigated over a 6 [place] VTK catheter and a 0.035" Terumo Glidewire into the aortic arch. The catheter was placed into the right common carotid artery and then advanced into the right internal carotid artery. The diagnostic catheter was removed. Frontal and lateral angiograms of the head were obtained. FINDINGS: 1. Ultrasound showed mild atherosclerotic changes of the right common femoral artery without significant stenosis. The caliber is adequate for vascular access. 2. Occlusion of the right M1/MCA with moderate leptomeningeal collateral flow from the right ACA to the right MCA territory. 3. Mild luminal irregularity of the right carotid siphon related to atherosclerotic disease without hemodynamically significant stenosis. A 2-3 mm outpouching from the communicating segment of the right ICA corresponds to a PCOM infundibulum. PROCEDURE: Using biplane roadmap guidance, a Cereglide 71 aspiration catheter was navigated over Colossus 35 microguidewire into the cavernous segment of the right ICA. The aspiration catheter was then advanced to the level of occlusion and connected to an aspiration pump. Continuous aspiration was performed for 2 minutes. The guide catheter was connected to a VacLok syringe. The aspiration catheter was subsequently removed under constant aspiration. The guide catheter was aspirated for debris. Right internal carotid artery angiograms with frontal lateral  views of the head showed complete recanalization of the right MCA vascular tree (TICI 3). The catheter was then retracted into the right common carotid artery. Frontal and lateral angiograms of the neck were obtained showing atherosclerotic  disease of the right carotid bifurcation without hemodynamically significant stenosis. Flat panel CT of the head was obtained and post processed in a separate workstation with concurrent attending physician supervision. Selected images were sent to PACS. No hemorrhagic complication. The catheter was subsequently withdrawn. Right common femoral artery angiogram was obtained in right anterior oblique view. The puncture is at the level of the common femoral artery. The artery has normal caliber, adequate for closure device. The sheath was exchanged over the wire for a Perclose prostyle which was utilized for access closure. Immediate hemostasis was achieved. IMPRESSION: Successful and uncomplicated mechanical thrombectomy for treatment of a right M1/MCA occlusion achieving complete recanalization (TICI 3). PLAN: Transfer to ICU. Electronically Signed   By: Baldemar Lenis M.D.   On: 01/24/2023 12:07   CT ANGIO HEAD NECK W WO CM W PERF (CODE STROKE)  Result Date: 01/24/2023 CLINICAL DATA:  Acute stroke workup EXAM: CT ANGIOGRAPHY HEAD AND NECK CT PERFUSION BRAIN TECHNIQUE: Multidetector CT imaging of the head and neck was performed using the standard protocol during bolus administration of intravenous contrast. Multiplanar CT image reconstructions and MIPs were obtained to evaluate the vascular anatomy. Carotid stenosis measurements (when applicable) are obtained utilizing NASCET criteria, using the distal internal carotid diameter as the denominator. Multiphase CT imaging of the brain was performed following IV bolus contrast injection. Subsequent parametric perfusion maps were calculated using RAPID software. RADIATION DOSE REDUCTION: This exam was performed  according to the departmental dose-optimization program which includes automated exposure control, adjustment of the mA and/or kV according to patient size and/or use of iterative reconstruction technique. CONTRAST:  OMNIPAQUE IOHEXOL 350 MG/ML SOLN COMPARISON:  06/28/2020 FINDINGS: CTA NECK FINDINGS Aortic arch: Atheromatous plaque. The brachiocephalic origin is not covered. Right carotid system: Mixed density plaque at the bifurcation. Proximal ICA stenosis measures less than 50%. No ulceration or beading. Left carotid system: Mainly calcified plaque at the bifurcation without significant stenosis or any ulceration. Vertebral arteries: Proximal subclavian atherosclerosis without flow reducing stenosis. Atheromatous plaque at the vertebral origins, greater on the left where there is high-grade narrowing. The right vertebral artery is dominant to a strong degree. Skeleton: No acute finding. Other neck: No acute finding Upper chest: Numerous new small pulmonary nodules in the upper lungs measuring up to 7 mm at the right apex. Review of the MIP images confirms the above findings CTA HEAD FINDINGS Anterior circulation: Abrupt cut off of the right M1 segment with poor downstream flow. Confluent atheromatous calcification of the cavernous carotids. No additional embolism/occlusion is seen. Negative for aneurysm. Generalized atheromatous irregularity of medium size branches. Posterior circulation: Atheromatous calcification of the vertebral and basilar arteries without flow reducing stenosis. No branch occlusion, beading, or aneurysm. Fetal type left PCA flow. Bilateral atheromatous irregularity of the posterior cerebral arteries with moderate right P2 segment stenosis. Venous sinuses: None significant Anatomic variants: As above Review of the MIP images confirms the above findings CT Brain Perfusion Findings: CBF (<30%) Volume: 23 cc mainly at the right temporal lobemL Perfusion (Tmax>6.0s) volume: 228 cc,  overestimated given the degree of cerebellar involvement. There is exclusion of the chronic right MCA branch infarct however.ML Mismatch Volume: Infarction Location:Right temporal Critical Value/emergent results were called by telephone at the time of interpretation on 01/24/2023 at 7:03 am to provider [facility] , who verbally acknowledged these results. IMPRESSION: 1. Emergent large vessel occlusion at the right M1 segment. Reported 20 cc infarct core; there is over estimation of the surrounding penumbra  although still extensive. 2. Widespread atherosclerosis without flow reducing stenosis or clear embolic source underlying the infarct. 3. High-grade atheromatous stenosis at the non dominant left vertebral origin. 4. Pulmonary nodules in the apical lungs measuring up to 7 mm. Recommend chest CT follow-up after convalescence. Electronically Signed   By: Tiburcio Pea M.D.   On: 01/24/2023 07:13   CT HEAD CODE STROKE WO CONTRAST  Result Date: 01/24/2023 CLINICAL DATA:  Code stroke.  Slurred speech EXAM: CT HEAD WITHOUT CONTRAST TECHNIQUE: Contiguous axial images were obtained from the base of the skull through the vertex without intravenous contrast. RADIATION DOSE REDUCTION: This exam was performed according to the departmental dose-optimization program which includes automated exposure control, adjustment of the mA and/or kV according to patient size and/or use of iterative reconstruction technique. COMPARISON:  Brain MRI 06/29/2020 FINDINGS: Brain: No evidence of acute infarction, hemorrhage, hydrocephalus, extra-axial collection or mass lesion/mass effect. Chronic right superior cerebellar and MCA branch infarct affecting the posterior frontal lobe. Chronic small vessel ischemic gliosis in the cerebral white matter. Mild for age cerebral volume loss. Vascular: Extensive atheromatous calcification Skull: Normal. Negative for fracture or focal lesion. Sinuses/Orbits: No acute finding. Other: These  results were communicated to Dr. Wilford Corner at 6:50 am on 01/24/2023 by epic chat ASPECTS [facility] Stroke Program Early CT Score) on the left - Ganglionic level infarction (caudate, lentiform nuclei, internal capsule, insula, M1-M3 cortex): 7 - Supraganglionic infarction (M4-M6 cortex): 3 Total score (0-10 with 10 being normal): 10 IMPRESSION: 1. No acute finding. 2. 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 Electronically Signed   By: Tiburcio Pea M.D.   On: 01/24/2023 06:52    Vitals:   01/24/23 1215 01/24/23 1230 01/24/23 1245 01/24/23 1300  BP: 131/84 132/87 (!) 144/89 (!) 155/100  Pulse:   70 69  Resp: 18 19 19 17   Temp:      TempSrc:      SpO2:   100% 99%  Weight:      Height:         PHYSICAL EXAM General:  Sedated, intubated patient. Appears frail, thin. Psych:  Could not fully evaluate due to level of consciousness.   CV: Regular rate and rhythm on monitor. Edema of BLLE.  Respiratory:  Intubated. GI: Abdomen soft, patient non-responsive to rub.   NEURO:  Mental Status: Patient fully sedated. Speech/Language: Could not fully evaluate due to level of consciousness.    Cranial Nerves:  II: PERRL. Corneal reflex right side intact, minimal on L.   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   CTA head & neck: Emergent large vessel occlusion at the right M1 segment. Widespread atherosclerosis. High-grade atheromatous stenosis at the non dominant left vertebral origin. CTP 20 cc infarct core with extensive penumbra  S/p IR with complete resolution of M1/MCA blockage achieving complete recanalization (TICI3). MRA  Not eligible  due to pacemaker lead being in [facility] per vendor ([employer]) CT repeat in am 2D Echo Pending. LDL 61 HgbA1c 6.6 VTE prophylaxis - SCDs Pradaxa (dabigatran) once a day prior to admission, now on no antithrombotics, will consider ASA vs. 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  CTA head neck showed right P2, left VA, bilateral ICA siphon severe stenosis.  EF 60 to 65%.  Negative for DVT, TEE negative.  LDL 117, A1c 5.9.  Discharged with DAPT and Lipitor 40 30-day CardioNet monitoring showed A. fib patient started on Eliquis. 12/2019  Admitted for right cerebellar infarct.  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  EF 60 to 65%, LDL 81, A1c 5.9.  Eliquis changed to Pradaxa, continue Lipitor 80. 06/2020 admitted for focal seizure started on keppra. MRI negative for stroke. LDL 71 and A1C 6.4. continued on pradaxa 150 bid and keppra.  Followed with [name] NP at [facility]  Atrial fibrillation, chronic  Home Meds: lopressor and pradaxa Taking once a day with pradaxa during the last 5-6 days with COVID and paxlovid Rate controlled Will consider ASA vs. Pradaxa based on CT repeat  Respiratory failure, post procedure COVID infection Remain intubated post op On vent CCM managing Recent COVID infection for the last 5-6 days Just completed paxlovid course.  Airborne isolation   Hypertension Home meds:  norvasc, cozaar Stable Blood Pressure Goal: SBP 120-160 for first 24 hours then less than 180   Hyperlipidemia Home meds: atorvastatin 80 LDL 61, goal < 70 Resumed home liptior 80 Continue statin at discharge  Dysphagia Patient has post-stroke dysphagia, SLP consulted NPO Has OG tube On IVF Will consider tube feeding in  am  Other Stroke Risk Factors Advanced age  Other Active Problems BPPV  Hospital day # 0  ATTENDING NOTE: I reviewed above note and agree with the assessment and plan. Pt was seen and examined.   Husband is at the outside of her room. Pt still intubated on propofol, eyes closed, not following commands. With forced eye opening, eyes in mid position, not blinking to visual threat, doll's eyes absent, not tracking, pupils 3mm bilaterally, reactive to light. Corneal reflex absent, gag and cough present. Breathing over the vent.  Facial symmetry not able to test due to ET tube.  Tongue protrusion not cooperative. On pain stimulation, LUE barely lift against gravity. BLE slight withdraw, no movement of RUE. No babinski. Sensation, coordination and gait not tested.   Pt stroke could be related to COVID infection hypercoagulable state, but more likely due to only taking pradaxa once a day during the COVID period with paxlovid. Pt not able to have MRI, will do CT repeat in am, will decide on ASA vs. Pradaxa after CT repeat. On lipitor. Pt on IVF, will consider tube feeding in am. Vent management per CCM.  For detailed assessment and plan, please refer to above/below as I have made changes wherever appropriate.   I had long discussion with husband at bedside, updated pt current condition, treatment plan and potential prognosis, and answered all the questions. He expressed understanding and appreciation.    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 This patient's care requires constant monitoring of vital signs, hemodynamics, respiratory and cardiac monitoring, review of multiple databases,  neurological assessment, discussion with family, other specialists and  medical decision making of high complexity. I spent 30 minutes of neurocritical care time in the care of this patient.    To contact Stroke Continuity provider, please refer to [email or web address]. After hours, contact General Neurology

## 2023-01-24 NOTE — Sedation Documentation (Signed)
Patient will be under anesthesia care, including all medication and hemodynamic documentation.

## 2023-01-24 NOTE — ED Triage Notes (Signed)
Pt arrives to ED c/o leftsided weakness, slurred speech, and left facial droop. LKN is 2230 last night. Pt reported to wake up from sleep with deficits.

## 2023-01-24 NOTE — Sedation Documentation (Signed)
Dr. Quay Burow made aware about absent bilateral distal pulses and right PT was doppler

## 2023-01-24 NOTE — Procedures (Signed)
Extubation Procedure Note  Patient Details:   Name: Mary Mercado DOB: 1940-10-20 MRN: [id number]   Airway Documentation:    Vent end date: 01/24/23 Vent end time: 1535   Evaluation  O2 sats: stable throughout Complications: No apparent complications Patient did tolerate procedure well. Bilateral Breath Sounds: Clear, Diminished   Yes  Patient extubated per order to 4L [place] with no apparent complications. Positive cuff leak was noted prior to extubation. Patient is alert and is able to weakly speak. Vitals are stable. RT will continue to monitor.   Quade Ramirez Lajuana Ripple 01/24/2023, 3:43 PM

## 2023-01-24 NOTE — Anesthesia Procedure Notes (Signed)
Procedure Name: Intubation Date/Time: 01/24/2023 7:25 AM  Performed by: Randon Goldsmith, CRNAPre-anesthesia Checklist: Patient identified, Emergency Drugs available, Suction available and Patient being monitored Patient Re-evaluated:Patient Re-evaluated prior to induction Oxygen Delivery Method: Circle system utilized Preoxygenation: Pre-oxygenation with 100% oxygen Induction Type: IV induction, Rapid sequence and Cricoid Pressure applied Laryngoscope Size: Mac and 3 Grade View: Grade II Tube type: Oral Tube size: 7.0 mm Number of attempts: 1 Airway Equipment and Method: Stylet and Oral airway Placement Confirmation: ETT inserted through vocal cords under direct vision, positive ETCO2 and breath sounds checked- equal and bilateral Secured at: 22 cm Tube secured with: Tape Dental Injury: Teeth and Oropharynx as per pre-operative assessment

## 2023-01-24 NOTE — Sedation Documentation (Signed)
Handoff given to [place], [place] RN. Groin site assessed, site C/D/I. Soft to palpation, no hematoma noted, and distal pulses on right side doppler. Pt remained intubated and unable to assess neurological status.

## 2023-01-24 NOTE — Procedures (Signed)
INTERVENTIONAL NEURORADIOLOGY BRIEF POSTPROCEDURE NOTE  DIAGNOSTIC CEREBRAL ANGIOGRAM, MECHANICAL THROMBECTOMY, FLAT PANEL HEAD CT  Attending physician: Baldemar Lenis, MD  Diagnosis: Right M1/MCA  Access site: Right common femoral artery.  Access closure: Perclose Prostyle.  Anesthesia: IR sedation: General endotracheal anesthesia.  Medication used: 2 g Ancef IV.  Complications: None.  Estimated blood loss:  30 mL.  Specimen: None.  Findings: Occlusion of the mid right M1/MCA. Mechanical thrombectomy performed with direct contact aspiration (x1) achieving complete recanalization (TICI 3). No hemorrhagic complication.  The patient tolerated the procedure well without incident or complication and was transferred to ICU in stable condition. She remained intubated due to reported positive COVID-19 test.  PLAN: - Bed rest x 6 hours - SBP 120-160 mmHg

## 2023-01-24 NOTE — Progress Notes (Signed)
SLP Cancellation Note  Patient Details Name: Mary Mercado MRN: [id number] DOB: 13-Oct-1940   Cancelled treatment:       Reason Eval/Treat Not Completed: Patient not medically ready   Aviana Shevlin, Riley Nearing 01/24/2023, 10:07 AM

## 2023-01-24 NOTE — Anesthesia Preprocedure Evaluation (Signed)
Anesthesia Evaluation  Patient identified by MRN, date of birth, ID band  Reviewed: Allergy & Precautions, Patient's Chart, lab work & pertinent test resultsPreop documentation limited or incomplete due to emergent nature of procedure.  Airway Mallampati: II  TM Distance: >3 FB Neck ROM: Full    Dental  (+) Dental Advisory Given   Pulmonary neg pulmonary ROS   Pulmonary exam normal breath sounds clear to auscultation       Cardiovascular hypertension, Pt. on medications + dysrhythmias + pacemaker  Rhythm:Regular Rate:Normal  Echo 2022 1. Left ventricular ejection fraction, by estimation, is 60 to 65%. The left ventricle has normal function. The left ventricle has no regional wall motion abnormalities. Left ventricular diastolic parameters are indeterminate.   2. Right ventricular systolic function is normal. The right ventricular size is mildly enlarged. There is normal pulmonary artery systolic pressure. The estimated right ventricular systolic pressure is 28.4 mmHg.   3. Left atrial size was severely dilated.   4. Right atrial size was severely dilated.   5. The mitral valve is degenerative. Trivial mitral valve regurgitation.   6. 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   7. The aortic valve is tricuspid. Aortic valve regurgitation is trivial. No aortic stenosis is present.   8. The inferior vena cava is normal in size with <50% respiratory variability, suggesting right atrial pressure of 8 mmHg.   Comparison(s): No significant change from prior study.      Neuro/Psych Seizures -,  CVA    GI/Hepatic negative GI ROS, Neg liver ROS,,,  Endo/Other  negative endocrine ROS    Renal/GU negative Renal ROS     Musculoskeletal negative musculoskeletal ROS (+)    Abdominal  (+) - obese  Peds  Hematology negative hematology ROS (+)    Anesthesia Other Findings   Reproductive/Obstetrics                              Anesthesia Physical Anesthesia Plan  ASA: 3 and emergent  Anesthesia Plan: General   Post-op Pain Management: Minimal or no pain anticipated   Induction: Intravenous  PONV Risk Score and Plan: 3 and Ondansetron, Dexamethasone and Treatment may vary due to age or medical condition  Airway Management Planned: Oral ETT  Additional Equipment:   Intra-op Plan:   Post-operative Plan: Possible Post-op intubation/ventilation  Informed Consent: I have reviewed the patients History and Physical, chart, labs and discussed the procedure including the risks, benefits and alternatives for the proposed anesthesia with the patient or authorized representative who has indicated his/her understanding and acceptance.     Dental advisory given  Plan Discussed with: CRNA  Anesthesia Plan Comments:          Anesthesia Quick Evaluation

## 2023-01-24 NOTE — Progress Notes (Signed)
Referring Physician(s): Dr Jerel Shepherd  Supervising Physician: Joana Reamer Kizzie Fantasia  Patient Status:  [facility] - In-pt  Chief Complaint:  CVA  Subjective:  NIR procedure 10/1:  Occlusion of the mid right M1/MCA. Mechanical thrombectomy performed with direct contact aspiration (x1) achieving complete recanalization (TICI 3)  Intubated Moving all 4s-- better every hour per RN Left side sl weaker still Follows commands Husband at bedside  Allergies: Duloxetine and Acetaminophen  Medications: Prior to Admission medications   Medication Sig Start Date End Date Taking? Authorizing Provider  amLODipine (NORVASC) 2.5 MG tablet TAKE 1 TABLET BY MOUTH EVERY DAY 01/16/23   Nahser, Deloris Ping, MD  atorvastatin (LIPITOR) 80 MG tablet TAKE 1 TABLET BY MOUTH DAILY AT 6 PM. 12/30/22   Nelwyn Salisbury, MD  dabigatran (PRADAXA) 150 MG CAPS capsule Take 1 capsule (150 mg total) by mouth 2 (two) times daily. 10/17/22   Duke Salvia, MD  dorzolamide (TRUSOPT) 2 % ophthalmic solution 1 drop 2 (two) times daily. 08/10/21   [provider]  dorzolamide-timolol (COSOPT) 2-0.5 % ophthalmic solution 1 drop 2 (two) times daily. 02/26/22   [provider]  levETIRAcetam (KEPPRA) 500 MG tablet Take 1 tablet (500 mg total) by mouth 2 (two) times daily. 03/09/22   [name], NP  losartan (COZAAR) 50 MG tablet TAKE 2 TABLETS BY MOUTH EVERY DAY 01/20/23   Nelwyn Salisbury, MD  metoprolol tartrate (LOPRESSOR) 25 MG tablet TAKE 1/2 TABLET BY MOUTH EVERY DAY AT BEDTIME 08/19/22   Nahser, Deloris Ping, MD  nirmatrelvir/ritonavir (PAXLOVID) 20 x 150 MG & 10 x 100MG  TABS Take 3 tablets by mouth 2 (two) times daily for 5 days. (Take nirmatrelvir 150 mg two tablets twice daily for 5 days and ritonavir 100 mg one tablet twice daily for 5 days) Patient GFR is 82 01/19/23 01/24/23  Nelwyn Salisbury, MD  Tafluprost, PF, 0.0015 % SOLN Apply 1 drop to eye daily.    [provider]     Vital Signs: BP (!)  150/90   Pulse 69   Temp (!) 97.5 F (36.4 C) (Oral)   Resp 13   Ht 5\' 5"  (1.651 m)   Wt 104 lb 11.5 oz (47.5 kg)   SpO2 100%   BMI 17.43 kg/m   Physical Exam Vitals reviewed.  Constitutional:      Comments: Intubated Not sedated  Musculoskeletal:     Comments: Moving all 4s Left weaker Follows most commands  Skin:    Comments: Right groin site c/d/I No bleeding No hematoma Good Rt foot pulses       Imaging: DG Abd Portable 1V  Result Date: 01/24/2023 CLINICAL DATA:  Orogastric tube placement EXAM: PORTABLE ABDOMEN - 1 VIEW COMPARISON:  CT 02/01/2011 FINDINGS: Gastric tube extends into the decompressed stomach. Visualized bowel gas pattern unremarkable. visualized lung bases clear. No abnormal abdominal calcifications. Lower abdomen excluded. Regional bones unremarkable. IMPRESSION: Gastric tube into decompressed stomach. Electronically Signed   By: Corlis Leak M.D.   On: 01/24/2023 12:24   DG Chest Port 1 View  Result Date: 01/24/2023 CLINICAL DATA:  Intubation EXAM: PORTABLE CHEST - 1 VIEW COMPARISON:  03/31/2020 FINDINGS: Endotracheal tube tip 6.5 cm above carina. Coarse attenuated bronchovascular markings with interstitial opacities in both upper lobes. 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 Heart size and mediastinal contours are within normal limits. Aortic Atherosclerosis (ICD10-170.0). No effusion. Visualized bones unremarkable. IMPRESSION: 1. Endotracheal tube tip 6.5 cm above carina. 2. Bilateral  upper lobe interstitial opacities. Electronically Signed   By: Corlis Leak M.D.   On: 01/24/2023 12:17   IR PERCUTANEOUS ART THROMBECTOMY/INFUSION INTRACRANIAL INC DIAG ANGIO  Result Date: 01/24/2023 INDICATION: 82 year old female presenting with acute left-sided weakness, left facial droop and slurred speech; NIHSS 7. Her last known well was 10:30 p.m. on 01/23/2023. Her past medical history is significant for prior strokes with mild residual left-sided  weakness, hypertension, hyperlipidemia, paroxysmal atrial fibrillation on Pradaxa having failed Eliquis, recently tested positive for COVID on 01/15/2023. Premorbid modified Rankin scale 2. Head CT showed no acute findings. CT angiogram of the head and neck showed a right M1/MCA occlusion. She was transferred to our service for mechanical thrombectomy. EXAM: ULTRASOUND-GUIDED VASCULAR ACCESS DIAGNOSTIC CEREBRAL ANGIOGRAM MECHANICAL THROMBECTOMY FLAT PANEL HEAD CT COMPARISON:  CT/CT angiogram of the head and neck 01/24/2023. MEDICATIONS: Ancef 2 g IV. ANESTHESIA/SEDATION: The procedure was performed under general anesthesia. CONTRAST:  45 mL of Omnipaque 300 milligram/mL FLUOROSCOPY: Radiation Exposure Index (as provided by the fluoroscopic device): 355 mGy Kerma COMPLICATIONS: None immediate. TECHNIQUE: Informed written consent was obtained from the patient's husband after a thorough discussion of the procedural risks, benefits and alternatives. All questions were addressed. Maximal Sterile Barrier Technique was utilized including caps, mask, sterile gowns, sterile gloves, sterile drape, hand hygiene and skin antiseptic. A timeout was performed prior to the initiation of the procedure. The right groin was prepped and draped in the usual sterile fashion. Using a micropuncture kit and the modified Seldinger technique, access was gained to the right common femoral artery and an 8 French sheath was placed. Real-time ultrasound guidance was utilized for vascular access including the acquisition of a permanent ultrasound image documenting patency of the accessed vessel. Under fluoroscopy, a Zoom 88 guide catheter was navigated over a 6 [place] VTK catheter and a 0.035" Terumo Glidewire into the aortic arch. The catheter was placed into the right common carotid artery and then advanced into the right internal carotid artery. The diagnostic catheter was removed. Frontal and lateral angiograms of the head were obtained.  FINDINGS: 1. Ultrasound showed mild atherosclerotic changes of the right common femoral artery without significant stenosis. The caliber is adequate for vascular access. 2. Occlusion of the right M1/MCA with moderate leptomeningeal collateral flow from the right ACA to the right MCA territory. 3. Mild luminal irregularity of the right carotid siphon related to atherosclerotic disease without hemodynamically significant stenosis. A 2-3 mm outpouching from the communicating segment of the right ICA corresponds to a PCOM infundibulum. PROCEDURE: Using biplane roadmap guidance, a Cereglide 71 aspiration catheter was navigated over Colossus 35 microguidewire into the cavernous segment of the right ICA. The aspiration catheter was then advanced to the level of occlusion and connected to an aspiration pump. Continuous aspiration was performed for 2 minutes. The guide catheter was connected to a VacLok syringe. The aspiration catheter was subsequently removed under constant aspiration. The guide catheter was aspirated for debris. Right internal carotid artery angiograms with frontal lateral views of the head showed complete recanalization of the right MCA vascular tree (TICI 3). The catheter was then retracted into the right common carotid artery. Frontal and lateral angiograms of the neck were obtained showing atherosclerotic disease of the right carotid bifurcation without hemodynamically significant stenosis. Flat panel CT of the head was obtained and post processed in a separate workstation with concurrent attending physician supervision. Selected images were sent to PACS. No hemorrhagic complication. The catheter was subsequently withdrawn. Right common femoral artery angiogram was obtained  in right anterior oblique view. The puncture is at the level of the common femoral artery. The artery has normal caliber, adequate for closure device. The sheath was exchanged over the wire for a Perclose prostyle which was utilized  for access closure. Immediate hemostasis was achieved. IMPRESSION: Successful and uncomplicated mechanical thrombectomy for treatment of a right M1/MCA occlusion achieving complete recanalization (TICI 3). PLAN: Transfer to ICU. Electronically Signed   By: Baldemar Lenis M.D.   On: 01/24/2023 12:07   IR CT Head Ltd  Result Date: 01/24/2023 INDICATION: 82 year old female presenting with acute left-sided weakness, left facial droop and slurred speech; NIHSS 7. Her last known well was 10:30 p.m. on 01/23/2023. Her past medical history is significant for prior strokes with mild residual left-sided weakness, hypertension, hyperlipidemia, paroxysmal atrial fibrillation on Pradaxa having failed Eliquis, recently tested positive for COVID on 01/15/2023. Premorbid modified Rankin scale 2. Head CT showed no acute findings. CT angiogram of the head and neck showed a right M1/MCA occlusion. She was transferred to our service for mechanical thrombectomy. EXAM: ULTRASOUND-GUIDED VASCULAR ACCESS DIAGNOSTIC CEREBRAL ANGIOGRAM MECHANICAL THROMBECTOMY FLAT PANEL HEAD CT COMPARISON:  CT/CT angiogram of the head and neck 01/24/2023. MEDICATIONS: Ancef 2 g IV. ANESTHESIA/SEDATION: The procedure was performed under general anesthesia. CONTRAST:  45 mL of Omnipaque 300 milligram/mL FLUOROSCOPY: Radiation Exposure Index (as provided by the fluoroscopic device): 355 mGy Kerma COMPLICATIONS: None immediate. TECHNIQUE: Informed written consent was obtained from the patient's husband after a thorough discussion of the procedural risks, benefits and alternatives. All questions were addressed. Maximal Sterile Barrier Technique was utilized including caps, mask, sterile gowns, sterile gloves, sterile drape, hand hygiene and skin antiseptic. A timeout was performed prior to the initiation of the procedure. The right groin was prepped and draped in the usual sterile fashion. Using a micropuncture kit and the modified Seldinger  technique, access was gained to the right common femoral artery and an 8 French sheath was placed. Real-time ultrasound guidance was utilized for vascular access including the acquisition of a permanent ultrasound image documenting patency of the accessed vessel. Under fluoroscopy, a Zoom 88 guide catheter was navigated over a 6 [place] VTK catheter and a 0.035" Terumo Glidewire into the aortic arch. The catheter was placed into the right common carotid artery and then advanced into the right internal carotid artery. The diagnostic catheter was removed. Frontal and lateral angiograms of the head were obtained. FINDINGS: 1. Ultrasound showed mild atherosclerotic changes of the right common femoral artery without significant stenosis. The caliber is adequate for vascular access. 2. Occlusion of the right M1/MCA with moderate leptomeningeal collateral flow from the right ACA to the right MCA territory. 3. Mild luminal irregularity of the right carotid siphon related to atherosclerotic disease without hemodynamically significant stenosis. A 2-3 mm outpouching from the communicating segment of the right ICA corresponds to a PCOM infundibulum. PROCEDURE: Using biplane roadmap guidance, a Cereglide 71 aspiration catheter was navigated over Colossus 35 microguidewire into the cavernous segment of the right ICA. The aspiration catheter was then advanced to the level of occlusion and connected to an aspiration pump. Continuous aspiration was performed for 2 minutes. The guide catheter was connected to a VacLok syringe. The aspiration catheter was subsequently removed under constant aspiration. The guide catheter was aspirated for debris. Right internal carotid artery angiograms with frontal lateral views of the head showed complete recanalization of the right MCA vascular tree (TICI 3). The catheter was then retracted into the right common carotid artery.  Frontal and lateral angiograms of the neck were obtained showing  atherosclerotic disease of the right carotid bifurcation without hemodynamically significant stenosis. Flat panel CT of the head was obtained and post processed in a separate workstation with concurrent attending physician supervision. Selected images were sent to PACS. No hemorrhagic complication. The catheter was subsequently withdrawn. Right common femoral artery angiogram was obtained in right anterior oblique view. The puncture is at the level of the common femoral artery. The artery has normal caliber, adequate for closure device. The sheath was exchanged over the wire for a Perclose prostyle which was utilized for access closure. Immediate hemostasis was achieved. IMPRESSION: Successful and uncomplicated mechanical thrombectomy for treatment of a right M1/MCA occlusion achieving complete recanalization (TICI 3). PLAN: Transfer to ICU. Electronically Signed   By: Baldemar Lenis M.D.   On: 01/24/2023 12:07   IR US Guide Vasc Access Right  Result Date: 01/24/2023 INDICATION: 82 year old female presenting with acute left-sided weakness, left facial droop and slurred speech; NIHSS 7. Her last known well was 10:30 p.m. on 01/23/2023. Her past medical history is significant for prior strokes with mild residual left-sided weakness, hypertension, hyperlipidemia, paroxysmal atrial fibrillation on Pradaxa having failed Eliquis, recently tested positive for COVID on 01/15/2023. Premorbid modified Rankin scale 2. Head CT showed no acute findings. CT angiogram of the head and neck showed a right M1/MCA occlusion. She was transferred to our service for mechanical thrombectomy. EXAM: ULTRASOUND-GUIDED VASCULAR ACCESS DIAGNOSTIC CEREBRAL ANGIOGRAM MECHANICAL THROMBECTOMY FLAT PANEL HEAD CT COMPARISON:  CT/CT angiogram of the head and neck 01/24/2023. MEDICATIONS: Ancef 2 g IV. ANESTHESIA/SEDATION: The procedure was performed under general anesthesia. CONTRAST:  45 mL of Omnipaque 300 milligram/mL  FLUOROSCOPY: Radiation Exposure Index (as provided by the fluoroscopic device): 355 mGy Kerma COMPLICATIONS: None immediate. TECHNIQUE: Informed written consent was obtained from the patient's husband after a thorough discussion of the procedural risks, benefits and alternatives. All questions were addressed. Maximal Sterile Barrier Technique was utilized including caps, mask, sterile gowns, sterile gloves, sterile drape, hand hygiene and skin antiseptic. A timeout was performed prior to the initiation of the procedure. The right groin was prepped and draped in the usual sterile fashion. Using a micropuncture kit and the modified Seldinger technique, access was gained to the right common femoral artery and an 8 French sheath was placed. Real-time ultrasound guidance was utilized for vascular access including the acquisition of a permanent ultrasound image documenting patency of the accessed vessel. Under fluoroscopy, a Zoom 88 guide catheter was navigated over a 6 [place] VTK catheter and a 0.035" Terumo Glidewire into the aortic arch. The catheter was placed into the right common carotid artery and then advanced into the right internal carotid artery. The diagnostic catheter was removed. Frontal and lateral angiograms of the head were obtained. FINDINGS: 1. Ultrasound showed mild atherosclerotic changes of the right common femoral artery without significant stenosis. The caliber is adequate for vascular access. 2. Occlusion of the right M1/MCA with moderate leptomeningeal collateral flow from the right ACA to the right MCA territory. 3. Mild luminal irregularity of the right carotid siphon related to atherosclerotic disease without hemodynamically significant stenosis. A 2-3 mm outpouching from the communicating segment of the right ICA corresponds to a PCOM infundibulum. PROCEDURE: Using biplane roadmap guidance, a Cereglide 71 aspiration catheter was navigated over Colossus 35 microguidewire into the cavernous  segment of the right ICA. The aspiration catheter was then advanced to the level of occlusion and connected to an aspiration pump. Continuous  aspiration was performed for 2 minutes. The guide catheter was connected to a VacLok syringe. The aspiration catheter was subsequently removed under constant aspiration. The guide catheter was aspirated for debris. Right internal carotid artery angiograms with frontal lateral views of the head showed complete recanalization of the right MCA vascular tree (TICI 3). The catheter was then retracted into the right common carotid artery. Frontal and lateral angiograms of the neck were obtained showing atherosclerotic disease of the right carotid bifurcation without hemodynamically significant stenosis. Flat panel CT of the head was obtained and post processed in a separate workstation with concurrent attending physician supervision. Selected images were sent to PACS. No hemorrhagic complication. The catheter was subsequently withdrawn. Right common femoral artery angiogram was obtained in right anterior oblique view. The puncture is at the level of the common femoral artery. The artery has normal caliber, adequate for closure device. The sheath was exchanged over the wire for a Perclose prostyle which was utilized for access closure. Immediate hemostasis was achieved. IMPRESSION: Successful and uncomplicated mechanical thrombectomy for treatment of a right M1/MCA occlusion achieving complete recanalization (TICI 3). PLAN: Transfer to ICU. Electronically Signed   By: Baldemar Lenis M.D.   On: 01/24/2023 12:07   CT ANGIO HEAD NECK W WO CM W PERF (CODE STROKE)  Result Date: 01/24/2023 CLINICAL DATA:  Acute stroke workup EXAM: CT ANGIOGRAPHY HEAD AND NECK CT PERFUSION BRAIN TECHNIQUE: Multidetector CT imaging of the head and neck was performed using the standard protocol during bolus administration of intravenous contrast. Multiplanar CT image reconstructions and  MIPs were obtained to evaluate the vascular anatomy. Carotid stenosis measurements (when applicable) are obtained utilizing NASCET criteria, using the distal internal carotid diameter as the denominator. Multiphase CT imaging of the brain was performed following IV bolus contrast injection. Subsequent parametric perfusion maps were calculated using RAPID software. RADIATION DOSE REDUCTION: This exam was performed according to the departmental dose-optimization program which includes automated exposure control, adjustment of the mA and/or kV according to patient size and/or use of iterative reconstruction technique. CONTRAST:  OMNIPAQUE IOHEXOL 350 MG/ML SOLN COMPARISON:  06/28/2020 FINDINGS: CTA NECK FINDINGS Aortic arch: Atheromatous plaque. The brachiocephalic origin is not covered. Right carotid system: Mixed density plaque at the bifurcation. Proximal ICA stenosis measures less than 50%. No ulceration or beading. Left carotid system: Mainly calcified plaque at the bifurcation without significant stenosis or any ulceration. Vertebral arteries: Proximal subclavian atherosclerosis without flow reducing stenosis. Atheromatous plaque at the vertebral origins, greater on the left where there is high-grade narrowing. The right vertebral artery is dominant to a strong degree. Skeleton: No acute finding. Other neck: No acute finding Upper chest: Numerous new small pulmonary nodules in the upper lungs measuring up to 7 mm at the right apex. Review of the MIP images confirms the above findings CTA HEAD FINDINGS Anterior circulation: Abrupt cut off of the right M1 segment with poor downstream flow. Confluent atheromatous calcification of the cavernous carotids. No additional embolism/occlusion is seen. Negative for aneurysm. Generalized atheromatous irregularity of medium size branches. Posterior circulation: Atheromatous calcification of the vertebral and basilar arteries without flow reducing stenosis. No branch  occlusion, beading, or aneurysm. Fetal type left PCA flow. Bilateral atheromatous irregularity of the posterior cerebral arteries with moderate right P2 segment stenosis. Venous sinuses: None significant Anatomic variants: As above Review of the MIP images confirms the above findings CT Brain Perfusion Findings: CBF (<30%) Volume: 23 cc mainly at the right temporal lobemL Perfusion (Tmax>6.0s) volume: 228  cc, overestimated given the degree of cerebellar involvement. There is exclusion of the chronic right MCA branch infarct however.ML Mismatch Volume: Infarction Location:Right temporal Critical Value/emergent results were called by telephone at the time of interpretation on 01/24/2023 at 7:03 am to provider [facility] , who verbally acknowledged these results. IMPRESSION: 1. Emergent large vessel occlusion at the right M1 segment. Reported 20 cc infarct core; there is over estimation of the surrounding penumbra although still extensive. 2. Widespread atherosclerosis without flow reducing stenosis or clear embolic source underlying the infarct. 3. High-grade atheromatous stenosis at the non dominant left vertebral origin. 4. Pulmonary nodules in the apical lungs measuring up to 7 mm. Recommend chest CT follow-up after convalescence. Electronically Signed   By: Tiburcio Pea M.D.   On: 01/24/2023 07:13   CT HEAD CODE STROKE WO CONTRAST  Result Date: 01/24/2023 CLINICAL DATA:  Code stroke.  Slurred speech EXAM: CT HEAD WITHOUT CONTRAST TECHNIQUE: Contiguous axial images were obtained from the base of the skull through the vertex without intravenous contrast. RADIATION DOSE REDUCTION: This exam was performed according to the departmental dose-optimization program which includes automated exposure control, adjustment of the mA and/or kV according to patient size and/or use of iterative reconstruction technique. COMPARISON:  Brain MRI 06/29/2020 FINDINGS: Brain: No evidence of acute infarction, hemorrhage,  hydrocephalus, extra-axial collection or mass lesion/mass effect. Chronic right superior cerebellar and MCA branch infarct affecting the posterior frontal lobe. Chronic small vessel ischemic gliosis in the cerebral white matter. Mild for age cerebral volume loss. Vascular: Extensive atheromatous calcification Skull: Normal. Negative for fracture or focal lesion. Sinuses/Orbits: No acute finding. Other: These results were communicated to Dr. Wilford Corner at 6:50 am on 01/24/2023 by epic chat ASPECTS [facility] Stroke Program Early CT Score) on the left - Ganglionic level infarction (caudate, lentiform nuclei, internal capsule, insula, M1-M3 cortex): 7 - Supraganglionic infarction (M4-M6 cortex): 3 Total score (0-10 with 10 being normal): 10 IMPRESSION: 1. No acute finding. 2. 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 Electronically Signed   By: Tiburcio Pea M.D.   On: 01/24/2023 06:52    Labs:  CBC: Recent Labs    03/15/22 0709 01/24/23 0643 01/24/23 1102  WBC 4.8  --   --   HGB 14.9 14.6 13.9  HCT 44.2 43.0 41.0  PLT 153.0  --   --     COAGS: No results for input(s): "INR", "APTT" in the last 8760 hours.  BMP: Recent Labs    03/15/22 0709 08/12/22 1341 01/24/23 0643 01/24/23 1102  NA 139 138 140 136  K 4.2 4.7 3.8 3.6  CL 103 103 104  --   CO2 30 29  --   --   GLUCOSE 122* 194* 136*  --   BUN 9 16 9   --   CALCIUM 9.3 9.4  --   --   CREATININE 0.66 0.64 0.40*  --     LIVER FUNCTION TESTS: Recent Labs    03/15/22 0709  BILITOT 1.4*  AST 28  ALT 22  ALKPHOS 65  PROT 7.2  ALBUMIN 4.2    Assessment and Plan:  CVA R MCA thrombectomy Hope for extubation soon Plan per Stroke team  Electronically Signed: Robet Leu, PA-C 01/24/2023, 2:46 PM   I spent a total of 15 Minutes at the the patient's bedside AND on the patient's hospital floor or unit, greater than 50% of which was counseling/coordinating care for R MCA  revascularization

## 2023-01-24 NOTE — Consult Note (Signed)
NAME:  Mary Mercado, MRN:  [id number], DOB:  Jan 18, 1941, LOS: 0 ADMISSION DATE:  01/24/2023, CONSULTATION DATE:  01/24/2023 REFERRING MD:  Wilford Corner, MD, CHIEF COMPLAINT:  stroke   History of Present Illness:  82 year old female with past medical history of stroke, HLD, HTN, PAF on Pradaxa who presented to emergency department 01/24/23 morning as code stroke. Noted to have left-sided weakness, left facial droop, speech change. LKW 10:30PM 01/23/23. LVO+. Incidentally, had recent home COVID+ test on 01/15/23. 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 She was emergently taken to IR for mechanical thrombectomy. Was not given TNK. She was transferred to ICU post-IR intubated on mechanical ventilation. PCCM consulted for vent management and possible COVID pneumonia.   Pertinent  Medical History  stroke, HLD, HTN, PAF on Pradaxa  Significant Hospital Events: Including procedures, antibiotic start and stop dates in addition to other pertinent events   10/1: code stroke, sp thrombectomy > icu. PCCM consulted vent mgmt   Interim History / Subjective:  Intubated, sedated. Unable to participate in history   Objective   Blood pressure (!) 157/97, pulse 70, temperature 97.9 F (36.6 C), temperature source Axillary, resp. rate (!) 22, height 5\' 5"  (1.651 m), weight 46 kg, SpO2 100%.        Intake/Output Summary (Last 24 hours) at 01/24/2023 0855 Last data filed at 01/24/2023 0831 Gross per 24 hour  Intake 450 ml  Output --  Net 450 ml   Filed Weights   01/24/23 0600  Weight: 46 kg    Examination: General: elderly female, critically ill, intubated, sedated  HENT: PERRLA, mucous membranes moist  Lungs: clear bilaterally, vented  Cardiovascular: s1s2, regular rate without murmur, rub, gallop  Abdomen: soft, +BS Extremities: cool hands with radial pulses. Doppler R pedal pulse. Unable to obtain doppler left pedal pulse - consistent with  previous findings. BLE 1+ pitting edema Neuro: intubated, sedated  RASS -4 at this time  GU: foley   Resolved Hospital Problem list     Assessment & Plan:    R MCA stroke s/p thrombectomy Plan: -stroke primary; appreciate recs -repeat imaging per neuro -frequent neuro checks -limit sedating meds -SBP goal 120-160 per neuro IR -statin, DAPT per neuro -Continue neuroprotective measures- normothermia, euglycemia, HOB greater than 30, head in neutral alignment, normocapnia, normoxia.  -PT/OT/SLP -lipid panel and echo pending   Post op vent management Plan: -CXR on arrival -LTVV strategy with tidal volumes of 6-8 cc/kg ideal body weight -check ABG and adjust settings accordingly -Goal plateau pressures less than 30 and driving pressures less than 15 -Wean PEEP/FiO2 for SpO2 >92% -VAP bundle in place -Daily SAT and SBT -PAD protocol in place -wean sedation for RASS goal 0 to -1   Recent Covid Positive -recently tested positive and started on paxlovid outpatient on 9/26. Plan: -appears to have completed course of paxlovid and on room air on arrival to emergency department  -check cxr -supportive care -pulm toiletry   Hx of seizure Plan: -AEDs per neuro   HTN HLD Plan: -cont cleviprex for sbp goal as above -resume statin -consider resuming home bp meds later today vs. tomorrow   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 up to 7 mm Plan: -CT chest  when stable      Best Practice (right click and "Reselect all SmartList Selections" daily)   Diet/type: NPO DVT prophylaxis: SCDs  GI prophylaxis: H2B Lines: n/a Foley: needs to stay in place for now  Code Status:  full code  Last date of multidisciplinary goals of care discussion [per  primary]  Labs   CBC: Recent Labs  Lab 01/24/23 0643  HGB 14.6  HCT 43.0    Basic Metabolic Panel: Recent Labs  Lab 01/24/23 0643  NA 140  K 3.8  CL 104  GLUCOSE 136*  BUN 9  CREATININE 0.40*   GFR: Estimated Creatinine Clearance: 38.7 mL/min (A) (by C-G formula based on SCr of 0.4 mg/dL (L)). Recent Labs  Lab 01/24/23 0644  LATICACIDVEN 1.7    Liver Function Tests: No results for input(s): "AST", "ALT", "ALKPHOS", "BILITOT", "PROT", "ALBUMIN" in the last 168 hours. No results for input(s): "LIPASE", "AMYLASE" in the last 168 hours. No results for input(s): "AMMONIA" in the last 168 hours.  ABG    Component Value Date/Time   TCO2 22 01/24/2023 0643     Coagulation Profile: No results for input(s): "INR", "PROTIME" in the last 168 hours.  Cardiac Enzymes: No results for input(s): "CKTOTAL", "CKMB", "CKMBINDEX", "TROPONINI" in the last 168 hours.  HbA1C: Hgb A1c MFr Bld  Date/Time Value Ref Range Status  08/12/2022 01:41 PM 6.6 (H) 4.6 - 6.5 % Final    Comment:    Glycemic Control Guidelines for People with Diabetes:Non Diabetic:  <6%Goal of Therapy: <7%Additional Action Suggested:  >8%   03/15/2022 07:09 AM 6.8 (H) 4.6 - 6.5 % Final    Comment:    Glycemic Control Guidelines for People with Diabetes:Non Diabetic:  <6%Goal of Therapy: <7%Additional Action Suggested:  >8%     CBG: Recent Labs  Lab 01/24/23 0636 01/24/23 0641  GLUCAP <10* 166*    Review of Systems:   As per HPI  Past Medical History:  She,  has a past medical history of Allergy, CVA (cerebral vascular accident) (HCC) (05/2018, 01/09/20), Hyperlipidemia LDL goal <70, Hypertension, Palpitations, and Vertigo, benign positional.   Surgical History:   Past Surgical History:  Procedure Laterality Date   AV NODE ABLATION N/A 03/30/2020   Procedure: AV NODE ABLATION;  Surgeon: Duke Salvia, MD;  Location: [facility] INVASIVE CV LAB;  Service: Cardiovascular;  Laterality: N/A;   AV NODE  ABLATION N/A 03/30/2020   Procedure: AV NODE ABLATION;  Surgeon: Duke Salvia, MD;  Location: [facility] INVASIVE CV LAB;  Service: Cardiovascular;  Laterality: N/A;   CARDIOVERSION Left 10/31/2018   Procedure: CARDIOVERSION;  Surgeon: Elease Hashimoto Deloris Ping, MD;  Location: [facility] ENDOSCOPY;  Service: Cardiovascular;  Laterality: Left;   CARDIOVERSION N/A 01/08/2020   Procedure: CARDIOVERSION;  Surgeon: Parke Poisson, MD;  Location: [facility] ENDOSCOPY;  Service: Cardiovascular;  Laterality: N/A;   Hemilaminectomy and microdiskectomy at L4-5 on the left.  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   Procedure: PACEMAKER IMPLANT;  Surgeon: Duke Salvia, MD;  Location: [facility] INVASIVE CV LAB;  Service: Cardiovascular;  Laterality: N/A;   TEE WITHOUT CARDIOVERSION N/A 06/05/2018   Procedure: TRANSESOPHAGEAL ECHOCARDIOGRAM (TEE);  Surgeon: Lewayne Bunting, MD;  Location: [facility] ENDOSCOPY;  Service: Cardiovascular;  Laterality: N/A;  loop     Social History:   reports that she has never smoked. She has never used smokeless tobacco. She reports that she does not drink alcohol and does not use drugs.   Family History:  Her family  history includes Colon cancer in her father; Diabetes in her mother; Heart attack in her mother; Heart attack (age of onset: 40) in her brother. There is no history of Sleep apnea.   Allergies Allergies  Allergen Reactions   Duloxetine     Causes Bp elevation.  Increased body jerks, nausea   Acetaminophen Hives and Rash     Home Medications  Prior to Admission medications   Medication Sig Start Date End Date Taking? Authorizing Provider  amLODipine (NORVASC) 2.5 MG tablet TAKE 1 TABLET BY MOUTH EVERY DAY 01/16/23   Nahser, Deloris Ping, MD  atorvastatin (LIPITOR) 80 MG tablet TAKE 1 TABLET BY MOUTH DAILY AT 6 PM. 12/30/22   Nelwyn Salisbury, MD  dabigatran (PRADAXA) 150 MG CAPS capsule Take 1 capsule (150 mg total) by mouth 2 (two) times daily. 10/17/22   Duke Salvia, MD  dorzolamide (TRUSOPT)  2 % ophthalmic solution 1 drop 2 (two) times daily. 08/10/21   [provider]  dorzolamide-timolol (COSOPT) 2-0.5 % ophthalmic solution 1 drop 2 (two) times daily. 02/26/22   [provider]  levETIRAcetam (KEPPRA) 500 MG tablet Take 1 tablet (500 mg total) by mouth 2 (two) times daily. 03/09/22   [name], NP  losartan (COZAAR) 50 MG tablet TAKE 2 TABLETS BY MOUTH EVERY DAY 01/20/23   Nelwyn Salisbury, MD  metoprolol tartrate (LOPRESSOR) 25 MG tablet TAKE 1/2 TABLET BY MOUTH EVERY DAY AT BEDTIME 08/19/22   Nahser, Deloris Ping, MD  nirmatrelvir/ritonavir (PAXLOVID) 20 x 150 MG & 10 x 100MG  TABS Take 3 tablets by mouth 2 (two) times daily for 5 days. (Take nirmatrelvir 150 mg two tablets twice daily for 5 days and ritonavir 100 mg one tablet twice daily for 5 days) Patient GFR is 82 01/19/23 01/24/23  Nelwyn Salisbury, MD  Tafluprost, PF, 0.0015 % SOLN Apply 1 drop to eye daily.    [provider]     Critical care time: 52

## 2023-01-24 NOTE — Progress Notes (Signed)
PT Cancellation Note  Patient Details Name: Mary Mercado MRN: [id number] DOB: February 07, 1941   Cancelled Treatment:    Reason Eval/Treat Not Completed: Medical issues which prohibited therapy. Pt recently extubated. PT will follow up for evaluation tomorrow.   Arlyss Gandy 01/24/2023, 3:57 PM

## 2023-01-24 NOTE — Progress Notes (Signed)
IV team to bedside s/p consult for vasopressor. Pt not on vasopressor at this time. Pt has 3 PIV's in place and per RN does not need additional access at this time. Encouraged primary RN to re-consult if vasopressor is started.

## 2023-01-24 NOTE — H&P (Addendum)
Neurology Consultation  Reason for Consult: code stroke Referring Physician: Dr Wilkie Aye   CC: Left-sided weakness, slurred speech  History is obtained from: Chart, patient  HPI: Mary Mercado is a 82 y.o. female past medical history of prior strokes with mild residual left-sided weakness, hypertension, hyperlipidemia, paroxysmal atrial fibrillation on Pradaxa having failed Eliquis, recently tested positive for COVID on 01/15/2023 or thereabouts, who is quarantining at home, last known well at 10:30 PM yesterday when she went to bed, was noted to be weaker when she woke up to go to the bathroom and unable to ambulate to the bathroom.  She was exhibiting some increased slurred speech and left-sided weakness according to the husband who called EMS.  On EMS examination, there was some left-sided facial drooping and some left-sided weakness for which code stroke was activated-LVO positive.  Fast ED score was 4. Patient denies any headaches or visual symptoms.  Evaluated on the ER bridge.  See detailed exam below CBG-"low" on the ER glucometer.  According to EMS, her CBG on scene was in the 140s. Given D50 and repeat CBG was 156. Symptoms did not improve with D50    LKW: 10:30 PM IV thrombolysis given?: no, outside the window (not a candidate even if she were in the window because she is on Pradaxa and is regularly taking medication) EVT: YES Premorbid modified Rankin scale (mRS): 2  ROS: Full ROS was performed and is negative except as noted in the HPI.   Past Medical History:  Diagnosis Date   Allergy    CVA (cerebral vascular accident) (HCC) 05/2018, 01/09/20   Hyperlipidemia LDL goal <70    Hypertension    Palpitations    Vertigo, benign positional     Family History  Problem Relation Age of Onset   Diabetes Mother    Heart attack Mother        16   Colon cancer Father    Heart attack Brother 11   Sleep apnea Neg Hx     Social History:   reports that she has never  smoked. She has never used smokeless tobacco. She reports that she does not drink alcohol and does not use drugs.  Medications  Current Facility-Administered Medications:    [START ON 01/25/2023]  stroke: early stages of recovery book, , Does not apply, Once, Milon Dikes, MD   0.9 %  sodium chloride infusion, , Intravenous, Continuous, Milon Dikes, MD   senna-docusate (Senokot-S) tablet 1 tablet, 1 tablet, Oral, QHS PRN, Milon Dikes, MD   sodium chloride flush (NS) 0.9 % injection 3 mL, 3 mL, Intravenous, Once, Horton, Mayer Masker, MD  Current Outpatient Medications:    amLODipine (NORVASC) 2.5 MG tablet, TAKE 1 TABLET BY MOUTH EVERY DAY, Disp: 90 tablet, Rfl: 0   atorvastatin (LIPITOR) 80 MG tablet, TAKE 1 TABLET BY MOUTH DAILY AT 6 PM., Disp: 90 tablet, Rfl: 1   dabigatran (PRADAXA) 150 MG CAPS capsule, Take 1 capsule (150 mg total) by mouth 2 (two) times daily., Disp: 180 capsule, Rfl: 1   dorzolamide (TRUSOPT) 2 % ophthalmic solution, 1 drop 2 (two) times daily., Disp: , Rfl:    dorzolamide-timolol (COSOPT) 2-0.5 % ophthalmic solution, 1 drop 2 (two) times daily., Disp: , Rfl:    levETIRAcetam (KEPPRA) 500 MG tablet, Take 1 tablet (500 mg total) by mouth 2 (two) times daily., Disp: 180 tablet, Rfl: 3   losartan (COZAAR) 50 MG tablet, TAKE 2 TABLETS BY MOUTH EVERY DAY, Disp: 180 tablet, Rfl: 0  metoprolol tartrate (LOPRESSOR) 25 MG tablet, TAKE 1/2 TABLET BY MOUTH EVERY DAY AT BEDTIME, Disp: 45 tablet, Rfl: 2   nirmatrelvir/ritonavir (PAXLOVID) 20 x 150 MG & 10 x 100MG  TABS, Take 3 tablets by mouth 2 (two) times daily for 5 days. (Take nirmatrelvir 150 mg two tablets twice daily for 5 days and ritonavir 100 mg one tablet twice daily for 5 days) Patient GFR is 82, Disp: 30 tablet, Rfl: 0   Tafluprost, PF, 0.0015 % SOLN, Apply 1 drop to eye daily., Disp: , Rfl:   Facility-Administered Medications Ordered in Other Encounters:    regadenoson (LEXISCAN) injection SOLN 0.4 mg, 0.4 mg,  Intravenous, Once, Jodelle Red, MD   technetium tetrofosmin (TC-MYOVIEW) injection 32 millicurie, 32 millicurie, Intravenous, Once PRN, Jodelle Red, MD  Exam: Current vital signs: BP (!) 157/97 (BP Location: Right Arm)   Pulse 70   Temp 97.9 F (36.6 C) (Axillary)   Resp (!) 22   Ht 5\' 5"  (1.651 m)   Wt 46 kg   SpO2 100%   BMI 16.88 kg/m  Vital signs in last 24 hours: Temp:  [97.9 F (36.6 C)] 97.9 F (36.6 C) (10/01 0713) Pulse Rate:  [70] 70 (10/01 0713) Resp:  [22] 22 (10/01 0713) BP: (157)/(97) 157/97 (10/01 0713) SpO2:  [100 %] 100 % (10/01 0713) Weight:  [46 kg] 46 kg (10/01 0600) General: Awake alert in no distress HEENT: Normocephalic atraumatic, injected sclera CVs: Regular rhythm Respiratory: Scattered rales, on supplemental oxygen per EMS Abdomen nondistended nontender Neurological exam She is awake alert oriented x 3 Her speech is moderate to severely dysarthric and sometimes hard to comprehend. There is no evidence of aphasia She is able to follow all commands Cranial nerves: Pupils equal round react light, extraocular movements intact, visual fields full, face appears grossly symmetric on smiling but at rest there is very subtle asymmetry-looks like there might be some left-sided weakness but on smiling she is able to overcome it.  Facial sensation intact. Motor exam with no drift in the upper extremities.  Left lower extremity and right lower extremity show mild drift equally but unclear if she is able to keep her legs up without drift. Sensation mildly diminished on left with possible extinction on the left to DSS Coordination examination shows no dysmetria NIH stroke scale 7   Labs I have reviewed labs in epic and the results pertinent to this consultation are:  CBC    Component Value Date/Time   WBC 4.8 03/15/2022 0709   RBC 4.83 03/15/2022 0709   HGB 14.6 01/24/2023 0643   HGB 13.9 01/28/2021 1020   HCT 43.0 01/24/2023 0643    HCT 43.4 01/28/2021 1020   PLT 153.0 03/15/2022 0709   PLT 254 01/28/2021 1020   MCV 91.5 03/15/2022 0709   MCV 90 01/28/2021 1020   MCH 29.0 01/28/2021 1020   MCH 29.3 06/29/2020 0323   MCHC 33.9 03/15/2022 0709   RDW 13.6 03/15/2022 0709   RDW 12.5 01/28/2021 1020   LYMPHSABS 1.5 03/15/2022 0709   MONOABS 0.4 03/15/2022 0709   EOSABS 0.3 03/15/2022 0709   BASOSABS 0.1 03/15/2022 0709    CMP     Component Value Date/Time   NA 140 01/24/2023 0643   NA 143 09/21/2021 1600   K 3.8 01/24/2023 0643   CL 104 01/24/2023 0643   CO2 29 08/12/2022 1341   GLUCOSE 136 (H) 01/24/2023 0643   BUN 9 01/24/2023 0643   BUN 14 09/21/2021 1600  CREATININE 0.40 (L) 01/24/2023 0643   CREATININE 0.66 11/14/2019 1102   CALCIUM 9.4 08/12/2022 1341   PROT 7.2 03/15/2022 0709   ALBUMIN 4.2 03/15/2022 0709   AST 28 03/15/2022 0709   ALT 22 03/15/2022 0709   ALKPHOS 65 03/15/2022 0709   BILITOT 1.4 (H) 03/15/2022 0709   GFRNONAA >60 06/29/2020 0323   GFRAA 86 06/10/2020 1151    Lipid Panel     Component Value Date/Time   CHOL 139 03/15/2022 0709   CHOL 161 09/21/2021 1600   TRIG 72.0 03/15/2022 0709   HDL 62.90 03/15/2022 0709   HDL 84 09/21/2021 1600   CHOLHDL 2 03/15/2022 0709   VLDL 14.4 03/15/2022 0709   LDLCALC 61 03/15/2022 0709   LDLCALC 67 09/21/2021 1600   LDLCALC 90 11/14/2019 1102    Lab Results  Component Value Date   HGBA1C 6.6 (H) 08/12/2022      Imaging I have reviewed the images obtained:  CT-head-no acute changes.  Prior right hemispheric infarct and cerebellar infarcts visualized again. CTA head and neck and perfusion- right M1 occlusion with 205 cc penumbra and 23cc core (likely old stroke core with all the penumbra possibly new) Incidental lung nodules -see rads report.  Assessment:  82/F with above PMH with sudden onset of left sided weakness. Has prior strokes with mild residual left hemiparesis. Also was severely hypoglycemic on arrival, got D50  but symptoms persisted. Taken for stat CTH and CTA+P - right M1 occlusion with a large penumbra. The core identified is likely old stroke which means that most if  not all perfusion deficit is new and may be reversible. Discussed with husband who consents for the procedure-phone consent due to emergent nature and husband not in the hospital at the time of decision making. Discussed with NIR who agree to proceed.  Left-sided weakness from new right MCA territory stroke - cardioembolic vs hypercoag from COVID Right M1 occlusion COVID pneumonia - home test positive - on home test about a week or so ago Incidental lung nodules - needs CTA chest - ordered for tomorrow.  Recommendations: Taken emergently for EVT Post EVT admission to neuro ICU - stroke service primary Post EVT vitals and neurochecks BP goal post EVT (depends on revascularization - follow post EVT orderset) MRI brain when stable 2D echo A1c Lipid panel Extubation per anesthesia - if remains intubated, will need PCCM consult for vent mgmt Gentle hydration Day team to engage PCCM for COVID pna tx CXR, UA Check labs and replete lytes as needed. SCD for DVT PPX Protonix for GI PPX Doc senna for bowel. CT chest for incidental pulmonary nodules as recommended by radiology - would appreciate PCCM review once done.  FULL CODE  Discussed with EDP Dr. Wilkie Aye and Dr. Gaye Alken from IR Consent obtained form husband after discussing risk/benefit/aletrnatives. Agreed to proceed. RRT RN witnessed on phone.    POA: Stroke, hemiparesis, COVID 19 pneumonia, coagulopathy from DOAC at home.  -- Milon Dikes, MD Neurologist [facility] Pager: [phone]  CRITICAL CARE ATTESTATION Performed by: Milon Dikes, MD Total critical care time: 63 minutes Critical care time was exclusive of separately billable procedures and treating other patients and/or supervising APPs/Residents/Students Critical care was  necessary to treat or prevent imminent or life-threatening deterioration. This patient is critically ill and at significant risk for neurological worsening and/or death and care requires constant monitoring. Critical care was time spent personally by me on the following activities: development of treatment plan with patient and/or  surrogate as well as nursing, discussions with consultants, evaluation of patient's response to treatment, examination of patient, obtaining history from patient or surrogate, ordering and performing treatments and interventions, ordering and review of laboratory studies, ordering and review of radiographic studies, pulse oximetry, re-evaluation of patient's condition, participation in multidisciplinary rounds and medical decision making of high complexity in the care of this patient.

## 2023-01-24 NOTE — Progress Notes (Signed)
Afternoon rounds: Extubated at 3:30 to [place]. Doing okay. Will put in SLP swallow study for her. Neuro care per neuro team. To have CT head tomorrow as she cannot go through MRI machine for internal pacer. Will need to have CT chest when stable for pulmonary nodule found.

## 2023-01-24 NOTE — Code Documentation (Signed)
Stroke Response Nurse Documentation Code Documentation  Mary Mercado is a 82 y.o. female arriving to [facility]  via [facility] on 10/1 with past medical hx of prior strokes with left sided weakness, Afib, HTN, HLD,. On Pradaxa (dabigatran) twice a day. Code stroke was activated by EMS.   Patient from home where she was LKW at 2230 and now complaining of left sided weakness and slurred speech.  Stroke team at the bedside on patient arrival. Labs drawn and patient cleared for CT by Dr. Wilkie Aye. Patient to CT with team. NIHSS 9, see documentation for details and code stroke times. Patient with left hemianopia, bilateral leg weakness, left decreased sensation, dysarthria , and left neglect on exam. The following imaging was completed:  CT Head, CTA, and CTP. Patient is not a candidate for IV Thrombolytic due to outside of the window. Patient is a candidate for IR due to LVO.   Care Plan: mechanical thrombectomy.   Bedside handoff with IR RN.    Rose Fillers  Rapid Response RN

## 2023-01-24 NOTE — Transfer of Care (Signed)
Immediate Anesthesia Transfer of Care Note  Patient: Mary Mercado  Procedure(s) Performed: IR WITH ANESTHESIA  Patient Location: ICU  Anesthesia Type:General  Level of Consciousness: Patient remains intubated per anesthesia plan  Airway & Oxygen Therapy: Patient remains intubated per anesthesia plan and Patient placed on Ventilator (see vital sign flow sheet for setting)  Post-op Assessment: Report given to RN and Post -op Vital signs reviewed and stable  Post vital signs: Reviewed and stable  Last Vitals:  Vitals Value Taken Time  BP 95/66   Temp    Pulse 71 01/24/23 0914  Resp 18 01/24/23 0914  SpO2 100 % 01/24/23 0914  Vitals shown include unfiled device data.  Last Pain:  Vitals:   01/24/23 0713  TempSrc: Axillary         Complications: No notable events documented.

## 2023-01-24 NOTE — Anesthesia Postprocedure Evaluation (Signed)
Anesthesia Post Note  Patient: Mary Mercado  Procedure(s) Performed: IR WITH ANESTHESIA     Patient location during evaluation: ICU Anesthesia Type: General Level of consciousness: sedated and patient remains intubated per anesthesia plan Pain management: pain level controlled Vital Signs Assessment: post-procedure vital signs reviewed and stable Respiratory status: patient remains intubated per anesthesia plan Cardiovascular status: stable Anesthetic complications: no   No notable events documented.  Last Vitals:  Vitals:   01/24/23 1041 01/24/23 1100  BP:  120/80  Pulse: 70 69  Resp: 19 18  Temp:    SpO2: 100% 100%    Last Pain:  Vitals:   01/24/23 0913  TempSrc: Axillary                 Lewie Loron

## 2023-01-24 NOTE — ED Provider Notes (Signed)
[place] EMERGENCY DEPARTMENT AT [facility] Provider Note   CSN: [id number] Arrival date & time: 01/24/23  4098     History  Chief Complaint  Patient presents with   Code Stroke    Mary Mercado is a 82 y.o. female with a past medical history significant for hypertension, paroxysmal A-fib on Pradaxa, history of CVA, hyperlipidemia, pacemaker in place who presents to the ED as a code stroke.  Per EMS patient having left-sided weakness, left facial droop, and speech changes. Previous CVA with left sided deficits.  Being treated for COVID-19 currently. LKW 10:30PM yesterday. Patient LVO positive upon arrival to the ED. Airway patent at bridge.   History obtained from patient, EMS, and past medical records. No interpreter used during encounter.       Home Medications Prior to Admission medications   Medication Sig Start Date End Date Taking? Authorizing Provider  amLODipine (NORVASC) 2.5 MG tablet TAKE 1 TABLET BY MOUTH EVERY DAY 01/16/23   Nahser, Deloris Ping, MD  atorvastatin (LIPITOR) 80 MG tablet TAKE 1 TABLET BY MOUTH DAILY AT 6 PM. 12/30/22   Nelwyn Salisbury, MD  dabigatran (PRADAXA) 150 MG CAPS capsule Take 1 capsule (150 mg total) by mouth 2 (two) times daily. 10/17/22   Duke Salvia, MD  dorzolamide (TRUSOPT) 2 % ophthalmic solution 1 drop 2 (two) times daily. 08/10/21   [provider]  dorzolamide-timolol (COSOPT) 2-0.5 % ophthalmic solution 1 drop 2 (two) times daily. 02/26/22   [provider]  levETIRAcetam (KEPPRA) 500 MG tablet Take 1 tablet (500 mg total) by mouth 2 (two) times daily. 03/09/22   [name], NP  losartan (COZAAR) 50 MG tablet TAKE 2 TABLETS BY MOUTH EVERY DAY 01/20/23   Nelwyn Salisbury, MD  metoprolol tartrate (LOPRESSOR) 25 MG tablet TAKE 1/2 TABLET BY MOUTH EVERY DAY AT BEDTIME 08/19/22   Nahser, Deloris Ping, MD  nirmatrelvir/ritonavir (PAXLOVID) 20 x 150 MG & 10 x 100MG  TABS Take 3 tablets by mouth 2 (two) times daily  for 5 days. (Take nirmatrelvir 150 mg two tablets twice daily for 5 days and ritonavir 100 mg one tablet twice daily for 5 days) Patient GFR is 82 01/19/23 01/24/23  Nelwyn Salisbury, MD  Tafluprost, PF, 0.0015 % SOLN Apply 1 drop to eye daily.    [provider]      Allergies    Duloxetine and Acetaminophen    Review of Systems   Review of Systems  Unable to perform ROS: Acuity of condition  Neurological:  Positive for speech difficulty and weakness.    Physical Exam Updated Vital Signs BP (!) 157/97 (BP Location: Right Arm)   Pulse 70   Temp 97.9 F (36.6 C) (Axillary)   Resp (!) 22   Ht 5\' 5"  (1.651 m)   Wt 46 kg   SpO2 100%   BMI 16.88 kg/m  Physical Exam Vitals and nursing note reviewed.  Constitutional:      General: She is not in acute distress.    Appearance: She is not ill-appearing.  HENT:     Head: Normocephalic.  Eyes:     Pupils: Pupils are equal, round, and reactive to light.  Cardiovascular:     Rate and Rhythm: Normal rate and regular rhythm.     Pulses: Normal pulses.     Heart sounds: Normal heart sounds. No murmur heard.    No friction rub. No gallop.  Pulmonary:     Effort: Pulmonary effort  is normal.     Breath sounds: Normal breath sounds.  Abdominal:     General: Abdomen is flat. There is no distension.     Palpations: Abdomen is soft.     Tenderness: There is no abdominal tenderness. There is no guarding or rebound.  Musculoskeletal:        General: Normal range of motion.     Cervical back: Neck supple.  Skin:    General: Skin is warm and dry.  Neurological:     Mental Status: She is alert.     Comments: Left-sided weakness Facial droop at rest, not while smiling Slurred speech  Psychiatric:        Mood and Affect: Mood normal.        Behavior: Behavior normal.     ED Results / Procedures / Treatments   Labs (all labs ordered are listed, but only abnormal results are displayed) Labs Reviewed  I-STAT CHEM 8, ED -  Abnormal; Notable for the following components:      Result Value   Creatinine, Ser 0.40 (*)    Glucose, Bld 136 (*)    Calcium, Ion 1.06 (*)    All other components within normal limits  CBG MONITORING, ED - Abnormal; Notable for the following components:   Glucose-Capillary <10 (*)    All other components within normal limits  CBG MONITORING, ED - Abnormal; Notable for the following components:   Glucose-Capillary 166 (*)    All other components within normal limits  PROTIME-INR  APTT  CBC  DIFFERENTIAL  COMPREHENSIVE METABOLIC PANEL  ETHANOL  I-STAT CG4 LACTIC ACID, ED    EKG None  Radiology CT ANGIO HEAD NECK W WO CM W PERF (CODE STROKE)  Result Date: 01/24/2023 CLINICAL DATA:  Acute stroke workup EXAM: CT ANGIOGRAPHY HEAD AND NECK CT PERFUSION BRAIN TECHNIQUE: Multidetector CT imaging of the head and neck was performed using the standard protocol during bolus administration of intravenous contrast. Multiplanar CT image reconstructions and MIPs were obtained to evaluate the vascular anatomy. Carotid stenosis measurements (when applicable) are obtained utilizing NASCET criteria, using the distal internal carotid diameter as the denominator. Multiphase CT imaging of the brain was performed following IV bolus contrast injection. Subsequent parametric perfusion maps were calculated using RAPID software. RADIATION DOSE REDUCTION: This exam was performed according to the departmental dose-optimization program which includes automated exposure control, adjustment of the mA and/or kV according to patient size and/or use of iterative reconstruction technique. CONTRAST:  OMNIPAQUE IOHEXOL 350 MG/ML SOLN COMPARISON:  06/28/2020 FINDINGS: CTA NECK FINDINGS Aortic arch: Atheromatous plaque. The brachiocephalic origin is not covered. Right carotid system: Mixed density plaque at the bifurcation. Proximal ICA stenosis measures less than 50%. No ulceration or beading. Left carotid system: Mainly  calcified plaque at the bifurcation without significant stenosis or any ulceration. Vertebral arteries: Proximal subclavian atherosclerosis without flow reducing stenosis. Atheromatous plaque at the vertebral origins, greater on the left where there is high-grade narrowing. The right vertebral artery is dominant to a strong degree. Skeleton: No acute finding. Other neck: No acute finding Upper chest: Numerous new small pulmonary nodules in the upper lungs measuring up to 7 mm at the right apex. Review of the MIP images confirms the above findings CTA HEAD FINDINGS Anterior circulation: Abrupt cut off of the right M1 segment with poor downstream flow. Confluent atheromatous calcification of the cavernous carotids. No additional embolism/occlusion is seen. Negative for aneurysm. Generalized atheromatous irregularity of medium size branches. Posterior circulation: Atheromatous calcification  of the vertebral and basilar arteries without flow reducing stenosis. No branch occlusion, beading, or aneurysm. Fetal type left PCA flow. Bilateral atheromatous irregularity of the posterior cerebral arteries with moderate right P2 segment stenosis. Venous sinuses: None significant Anatomic variants: As above Review of the MIP images confirms the above findings CT Brain Perfusion Findings: CBF (<30%) Volume: 23 cc mainly at the right temporal lobemL Perfusion (Tmax>6.0s) volume: 228 cc, overestimated given the degree of cerebellar involvement. There is exclusion of the chronic right MCA branch infarct however.ML Mismatch Volume: Infarction Location:Right temporal Critical Value/emergent results were called by telephone at the time of interpretation on 01/24/2023 at 7:03 am to provider [facility] , who verbally acknowledged these results. IMPRESSION: 1. Emergent large vessel occlusion at the right M1 segment. Reported 20 cc infarct core; there is over estimation of the surrounding penumbra although still extensive. 2.  Widespread atherosclerosis without flow reducing stenosis or clear embolic source underlying the infarct. 3. High-grade atheromatous stenosis at the non dominant left vertebral origin. 4. Pulmonary nodules in the apical lungs measuring up to 7 mm. Recommend chest CT follow-up after convalescence. Electronically Signed   By: Tiburcio Pea M.D.   On: 01/24/2023 07:13   CT HEAD CODE STROKE WO CONTRAST  Result Date: 01/24/2023 CLINICAL DATA:  Code stroke.  Slurred speech EXAM: CT HEAD WITHOUT CONTRAST TECHNIQUE: Contiguous axial images were obtained from the base of the skull through the vertex without intravenous contrast. RADIATION DOSE REDUCTION: This exam was performed according to the departmental dose-optimization program which includes automated exposure control, adjustment of the mA and/or kV according to patient size and/or use of iterative reconstruction technique. COMPARISON:  Brain MRI 06/29/2020 FINDINGS: Brain: No evidence of acute infarction, hemorrhage, hydrocephalus, extra-axial collection or mass lesion/mass effect. Chronic right superior cerebellar and MCA branch infarct affecting the posterior frontal lobe. Chronic small vessel ischemic gliosis in the cerebral white matter. Mild for age cerebral volume loss. Vascular: Extensive atheromatous calcification Skull: Normal. Negative for fracture or focal lesion. Sinuses/Orbits: No acute finding. Other: These results were communicated to Dr. Wilford Corner at 6:50 am on 01/24/2023 by epic chat ASPECTS [facility] Stroke Program Early CT Score) on the left - Ganglionic level infarction (caudate, lentiform nuclei, internal capsule, insula, M1-M3 cortex): 7 - Supraganglionic infarction (M4-M6 cortex): 3 Total score (0-10 with 10 being normal): 10 IMPRESSION: 1. No acute finding. 2. 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 Electronically Signed   By: Tiburcio Pea M.D.   On: 01/24/2023 06:52     Procedures .Critical Care  Performed by: Mannie Stabile, PA-C Authorized by: Mannie Stabile, PA-C   Critical care provider statement:    Critical care time (minutes):  31   Critical care was necessary to treat or prevent imminent or life-threatening deterioration of the following conditions:  CNS failure or compromise   Critical care was time spent personally by me on the following activities:  Development of treatment plan with patient or surrogate, discussions with consultants, evaluation of patient's response to treatment, examination of patient, ordering and review of laboratory studies, ordering and review of radiographic studies, ordering and performing treatments and interventions, pulse oximetry, re-evaluation of patient's condition and review of old charts   I assumed direction of critical care for this patient from another provider in my specialty: no     Care discussed with: admitting provider       Medications Ordered in ED Medications  sodium chloride  flush (NS) 0.9 % injection 3 mL (has no administration in time range)   stroke: early stages of recovery book (has no administration in time range)  0.9 %  sodium chloride infusion (has no administration in time range)  senna-docusate (Senokot-S) tablet 1 tablet (has no administration in time range)  iohexol (OMNIPAQUE) 350 MG/ML injection 100 mL (100 mLs Intravenous Contrast Given 01/24/23 0657)    ED Course/ Medical Decision Making/ A&P Clinical Course as of 01/24/23 1610  Tue Jan 24, 2023  0721 Patient taken to IR due to MI occlusion [CA]    Clinical Course User Index [CA] Mannie Stabile, PA-C                                 Medical Decision Making Amount and/or Complexity of Data Reviewed Independent Historian: EMS External Data Reviewed: notes. Labs: ordered. Decision-making details documented in ED Course. Radiology: ordered and independent interpretation performed. Decision-making details  documented in ED Course. ECG/medicine tests: ordered and independent interpretation performed. Decision-making details documented in ED Course.   This patient presents to the ED for concern of left-sided weakness, this involves an extensive number of treatment options, and is a complaint that carries with it a high risk of complications and morbidity.  The differential diagnosis includes CVA, metabolic derangement, infection, etc  82 year old female presents to the ED as a code stroke due to left-sided weakness and speech changes.  Previous CVA with left-sided deficits.  COVID-19 positive.  Met patient at the bridge.  Airway patent.  Glucose reading as low.  Patient given D50.  Patient brought straight to CT scanner with neurology. LKW 10:30PM yesterday. LVO positive. CTA positive for M1 occlusion. Patient taken straight to IR. Admit to neurology, Dr. Jerrell Belfast.  Glucose improved to 166 after D50. Chem 8 reassuring. No major electrolyte abnormalities. Lactic acid normal. Other labs pending when patient taken to IR.          Final Clinical Impression(s) / ED Diagnoses Final diagnoses:  Cerebrovascular accident (CVA), unspecified mechanism [facility])    Rx / DC Orders ED Discharge Orders     None         Jesusita Oka 01/24/23 9604    Shon Baton, MD 01/24/23 2326

## 2023-01-24 NOTE — ED Notes (Signed)
Patient belongings went with patient to IR suite 3.

## 2023-01-25 ENCOUNTER — Inpatient Hospital Stay (HOSPITAL_COMMUNITY): Payer: Medicare Other

## 2023-01-25 ENCOUNTER — Encounter (HOSPITAL_COMMUNITY): Payer: Self-pay | Admitting: Radiology

## 2023-01-25 DIAGNOSIS — I63511 Cerebral infarction due to unspecified occlusion or stenosis of right middle cerebral artery: Secondary | ICD-10-CM | POA: Diagnosis not present

## 2023-01-25 DIAGNOSIS — I6389 Other cerebral infarction: Secondary | ICD-10-CM | POA: Diagnosis not present

## 2023-01-25 DIAGNOSIS — J9601 Acute respiratory failure with hypoxia: Secondary | ICD-10-CM | POA: Diagnosis not present

## 2023-01-25 LAB — CBC
HCT: 52.7 % — ABNORMAL HIGH (ref 36.0–46.0)
Hemoglobin: 17.5 g/dL — ABNORMAL HIGH (ref 12.0–15.0)
MCH: 31.2 pg (ref 26.0–34.0)
MCHC: 33.2 g/dL (ref 30.0–36.0)
MCV: 93.9 fL (ref 80.0–100.0)
Platelets: 168 10*3/uL (ref 150–400)
RBC: 5.61 MIL/uL — ABNORMAL HIGH (ref 3.87–5.11)
RDW: 12.9 % (ref 11.5–15.5)
WBC: 7.7 10*3/uL (ref 4.0–10.5)
nRBC: 0 % (ref 0.0–0.2)

## 2023-01-25 LAB — BASIC METABOLIC PANEL
Anion gap: 14 (ref 5–15)
BUN: 8 mg/dL (ref 8–23)
CO2: 21 mmol/L — ABNORMAL LOW (ref 22–32)
Calcium: 8.5 mg/dL — ABNORMAL LOW (ref 8.9–10.3)
Chloride: 101 mmol/L (ref 98–111)
Creatinine, Ser: 0.68 mg/dL (ref 0.44–1.00)
GFR, Estimated: >60 mL/min (ref >60)
Glucose, Bld: 95 mg/dL (ref 70–99)
Potassium: 3.5 mmol/L (ref 3.5–5.1)
Sodium: 136 mmol/L (ref 135–145)

## 2023-01-25 LAB — GLUCOSE, CAPILLARY
Glucose-Capillary: 88 mg/dL (ref 70–99)
Glucose-Capillary: 104 mg/dL — ABNORMAL HIGH (ref 70–99)
Glucose-Capillary: 137 mg/dL — ABNORMAL HIGH (ref 70–99)
Glucose-Capillary: 129 mg/dL — ABNORMAL HIGH (ref 70–99)
Glucose-Capillary: 172 mg/dL — ABNORMAL HIGH (ref 70–99)
Glucose-Capillary: 129 mg/dL — ABNORMAL HIGH (ref 70–99)

## 2023-01-25 LAB — HEMOGLOBIN A1C
Hgb A1c MFr Bld: 6.6 % — ABNORMAL HIGH (ref 4.8–5.6)
Mean Plasma Glucose: 142.72 mg/dL

## 2023-01-25 LAB — LIPID PANEL
Cholesterol: 153 mg/dL (ref 0–200)
HDL: 51 mg/dL (ref >40)
LDL Cholesterol: 84 mg/dL (ref 0–99)
Total CHOL/HDL Ratio: 3 {ratio}
Triglycerides: 90 mg/dL (ref <150)
VLDL: 18 mg/dL (ref 0–40)

## 2023-01-25 LAB — ECHOCARDIOGRAM COMPLETE
Area-P 1/2: 4.04 cm2
Height: 65 in
P 1/2 time: 715 ms
S' Lateral: 2.4 cm
Weight: 1657.86 [oz_av]

## 2023-01-25 LAB — TRIGLYCERIDES: Triglycerides: 125 mg/dL (ref <150)

## 2023-01-25 MED ORDER — LEVETIRACETAM 500 MG PO TABS
500.0000 mg | ORAL_TABLET | Freq: Two times a day (BID) | ORAL | Status: DC
  Administered 2023-01-25 – 2023-01-30 (×10): 500 mg via ORAL
  Filled 2023-01-25 (×10): qty 1

## 2023-01-25 MED ORDER — ATORVASTATIN CALCIUM 80 MG PO TABS
80.0000 mg | ORAL_TABLET | Freq: Every day | ORAL | Status: DC
  Administered 2023-01-27 – 2023-01-30 (×4): 80 mg via ORAL
  Filled 2023-01-25 (×5): qty 1

## 2023-01-25 MED ORDER — LEVETIRACETAM 100 MG/ML PO SOLN
500.0000 mg | Freq: Two times a day (BID) | ORAL | Status: DC
  Filled 2023-01-25: qty 5

## 2023-01-25 MED ORDER — ASPIRIN 325 MG PO TBEC
325.0000 mg | DELAYED_RELEASE_TABLET | Freq: Every day | ORAL | Status: DC
  Administered 2023-01-26: 325 mg via ORAL
  Filled 2023-01-25: qty 1

## 2023-01-25 MED ORDER — LEVETIRACETAM IN NACL 500 MG/100ML IV SOLN
500.0000 mg | Freq: Two times a day (BID) | INTRAVENOUS | Status: DC
  Administered 2023-01-25: 500 mg via INTRAVENOUS
  Filled 2023-01-25: qty 100

## 2023-01-25 MED ORDER — CHLORHEXIDINE GLUCONATE CLOTH 2 % EX PADS
6.0000 | MEDICATED_PAD | Freq: Every day | CUTANEOUS | Status: DC
  Administered 2023-01-25 – 2023-01-30 (×6): 6 via TOPICAL

## 2023-01-25 MED ORDER — ASPIRIN 325 MG PO TABS
325.0000 mg | ORAL_TABLET | Freq: Every day | ORAL | Status: DC
  Administered 2023-01-25: 325 mg
  Filled 2023-01-25: qty 1

## 2023-01-25 MED ORDER — ENOXAPARIN SODIUM 30 MG/0.3ML IJ SOSY
30.0000 mg | PREFILLED_SYRINGE | INTRAMUSCULAR | Status: DC
  Administered 2023-01-25 – 2023-01-26 (×2): 30 mg via SUBCUTANEOUS
  Filled 2023-01-25 (×2): qty 0.3

## 2023-01-25 MED ORDER — INSULIN ASPART 100 UNIT/ML IJ SOLN
0.0000 [IU] | INTRAMUSCULAR | Status: DC
  Administered 2023-01-25 – 2023-01-26 (×2): 1 [IU] via SUBCUTANEOUS

## 2023-01-25 MED ORDER — FAMOTIDINE 20 MG PO TABS
20.0000 mg | ORAL_TABLET | Freq: Every day | ORAL | Status: DC
  Administered 2023-01-26 – 2023-01-30 (×5): 20 mg via ORAL
  Filled 2023-01-25 (×5): qty 1

## 2023-01-25 MED ORDER — DOCUSATE SODIUM 100 MG PO CAPS: 100.0000 mg | ORAL_CAPSULE | Freq: Two times a day (BID) | ORAL | Status: DC | PRN

## 2023-01-25 NOTE — Progress Notes (Addendum)
STROKE TEAM PROGRESS NOTE  BRIEF HPI [name] is a 82 y.o. female with hx of stroke x3, HLD, HTN, PAF on Pradaxa who presented to emergency department 01/24/23 morning as code stroke. Left sided weakness, facial droop, and speech change were noted at admission. LKW: 2230, 01/23/23. LVO+. COVID + 9/22. Not given TNK.  Right M1 occlusion with large penumbra. Transferred to ICU, post IR. Now medically stable to advance to a progressive care floor.   SIGNIFICANT HOSPITAL EVENTS - 01/24/23. Admitted via ED, IR intervention. Extubated. - 01/25/23. Pt leaving ICU for progressive floor.  INTERIM HISTORY/SUBJECTIVE Met patient bedside with attending Dr Roda Shutters. Pt's husband was also present during the exam. Patient was awake, alert, and cooperative with exam. She stated that she felt "alright, considering everything." She voiced understanding about what had happened to her and seemed to be in good spirits. She hoped to return home via some rehab.    OBJECTIVE  CBC    Component Value Date/Time   WBC 7.7 01/25/2023 0818   RBC 5.61 (H) 01/25/2023 0818   HGB 17.5 (H) 01/25/2023 0818   HGB 13.9 01/28/2021 1020   HCT 52.7 (H) 01/25/2023 0818   HCT 43.4 01/28/2021 1020   PLT 168 01/25/2023 0818   PLT 254 01/28/2021 1020   MCV 93.9 01/25/2023 0818   MCV 90 01/28/2021 1020   MCH 31.2 01/25/2023 0818   MCHC 33.2 01/25/2023 0818   RDW 12.9 01/25/2023 0818   RDW 12.5 01/28/2021 1020   LYMPHSABS 0.9 01/24/2023 2218   MONOABS 0.7 01/24/2023 2218   EOSABS 0.0 01/24/2023 2218   BASOSABS 0.0 01/24/2023 2218    BMET    Component Value Date/Time   NA 136 01/25/2023 0818   NA 143 09/21/2021 1600   K 3.5 01/25/2023 0818   CL 101 01/25/2023 0818   CO2 21 (L) 01/25/2023 0818   GLUCOSE 95 01/25/2023 0818   BUN 8 01/25/2023 0818   BUN 14 09/21/2021 1600   CREATININE 0.68 01/25/2023 0818   CREATININE 0.66 11/14/2019 1102   CALCIUM 8.5 (L) 01/25/2023 0818   EGFR 66 09/21/2021 1600    GFRNONAA >60 01/25/2023 0818    IMAGING past 24 hours ECHOCARDIOGRAM COMPLETE  Result Date: 01/25/2023    ECHOCARDIOGRAM REPORT   Patient Name:   Mary Mercado Date of Exam: 01/25/2023 Medical Rec #:  [id number]                 Height:       65.0 in Accession #:    [id number]                Weight:       103.6 lb Date of Birth:  1940-04-29                 BSA:          1.496 m Patient Age:    82 years                  BP:           133/68 mmHg Patient Gender: F                         HR:           70 bpm. Exam Location:  Inpatient Procedure: 2D Echo, Color Doppler and Cardiac Doppler Indications:    Stroke I63.9  History:  Patient has prior history of Echocardiogram examinations, most                 recent 06/29/2020. Risk Factors:Hypertension and Dyslipidemia.  Sonographer:    Harriette Bouillon RDCS Referring Phys: [id number] ASHISH ARORA IMPRESSIONS  1. Left ventricular ejection fraction, by estimation, is 60 to 65%. The left ventricle has normal function. The left ventricle has no regional wall motion abnormalities. Left ventricular diastolic function could not be evaluated.  2. Right ventricular systolic function is hyperdynamic. The right ventricular size is mildly enlarged. There is moderately elevated pulmonary artery systolic pressure. The estimated right ventricular systolic pressure is 48.4 mmHg.  3. Left atrial size was severely dilated.  4. Right atrial size was severely dilated.  5. A small pericardial effusion is present. The pericardial effusion is circumferential.  6. The mitral valve is normal in structure. Trivial mitral valve regurgitation. No evidence of mitral stenosis.  7. Tricuspid valve regurgitation is severe.  8. The aortic valve is tricuspid. There is mild calcification of the aortic valve. There is mild thickening of the aortic valve. Aortic valve regurgitation is mild. Aortic valve sclerosis/calcification is present, without any evidence of aortic stenosis.  9. The  inferior vena cava is dilated in size with <50% respiratory variability, suggesting right atrial pressure of 15 mmHg. Comparison(s): No significant change from prior study. Prior images reviewed side by side. FINDINGS  Left Ventricle: Left ventricular ejection fraction, by estimation, is 60 to 65%. The left ventricle has normal function. The left ventricle has no regional wall motion abnormalities. The left ventricular internal cavity size was normal in size. There is  no left ventricular hypertrophy. Left ventricular diastolic function could not be evaluated due to atrial fibrillation. Left ventricular diastolic function could not be evaluated. Right Ventricle: The right ventricular size is mildly enlarged. No increase in right ventricular wall thickness. Right ventricular systolic function is hyperdynamic. There is moderately elevated pulmonary artery systolic pressure. The tricuspid regurgitant velocity is 2.89 m/s, and with an assumed right atrial pressure of 15 mmHg, the estimated right ventricular systolic pressure is 48.4 mmHg. Left Atrium: Left atrial size was severely dilated. Right Atrium: Right atrial size was severely dilated. Pericardium: A small pericardial effusion is present. The pericardial effusion is circumferential. Mitral Valve: The mitral valve is normal in structure. Mild to moderate mitral annular calcification. Trivial mitral valve regurgitation. No evidence of mitral valve stenosis. Tricuspid Valve: The tricuspid valve is normal in structure. Tricuspid valve regurgitation is severe. Aortic Valve: The noncoronary cusp is severely calcified and has severely restricted motion. The aortic valve is tricuspid. There is mild calcification of the aortic valve. There is mild thickening of the aortic valve. Aortic valve regurgitation is mild.  Aortic regurgitation PHT measures 715 msec. Aortic valve sclerosis/calcification is present, without any evidence of aortic stenosis. Pulmonic Valve: The  pulmonic valve was normal in structure. Pulmonic valve regurgitation is not visualized. No evidence of pulmonic stenosis. Aorta: The aortic root and ascending aorta are structurally normal, with no evidence of dilitation. Venous: The inferior vena cava is dilated in size with less than 50% respiratory variability, suggesting right atrial pressure of 15 mmHg. IAS/Shunts: No atrial level shunt detected by color flow Doppler. Additional Comments: A device lead is visualized in the right ventricle and right atrium.  LEFT VENTRICLE PLAX 2D LVIDd:         3.60 cm   Diastology LVIDs:         2.40 cm   LV  e' medial:   8.05 cm/s LV PW:         0.80 cm   LV E/e' medial: 10.8 LV IVS:        0.80 cm LVOT diam:     1.80 cm LV SV:         27 LV SV Index:   18 LVOT Area:     2.54 cm  RIGHT VENTRICLE            IVC RV S prime:     9.25 cm/s  IVC diam: 2.20 cm TAPSE (M-mode): 2.1 cm LEFT ATRIUM              Index        RIGHT ATRIUM           Index LA diam:        4.60 cm  3.08 cm/m   RA Area:     29.10 cm LA Vol (A2C):   65.2 ml  43.59 ml/m  RA Volume:   96.30 ml  64.38 ml/m LA Vol (A4C):   122.0 ml 81.56 ml/m LA Biplane Vol: 88.0 ml  58.83 ml/m  AORTIC VALVE LVOT Vmax:   54.20 cm/s LVOT Vmean:  39.500 cm/s LVOT VTI:    0.107 m AI PHT:      715 msec  AORTA Ao Root diam: 2.70 cm Ao Asc diam:  3.30 cm MITRAL VALVE               TRICUSPID VALVE MV Area (PHT): 4.04 cm    TR Peak grad:   33.4 mmHg MV Decel Time: 188 msec    TR Vmax:        289.00 cm/s MV E velocity: 87.00 cm/s                            SHUNTS                            Systemic VTI:  0.11 m                            Systemic Diam: 1.80 cm Rachelle Hora Croitoru MD Electronically signed by Thurmon Fair MD Signature Date/Time: 01/25/2023/1:41:58 PM    Final    CT HEAD WO CONTRAST ( )  Result Date: 01/25/2023 CLINICAL DATA:  Provided history: Stroke, follow-up. EXAM: CT HEAD WITHOUT CONTRAST TECHNIQUE: Contiguous axial images were obtained from the base of the  skull through the vertex without intravenous contrast. RADIATION DOSE REDUCTION: This exam was performed according to the departmental dose-optimization program which includes automated exposure control, adjustment of the mA and/or kV according to patient size and/or use of iterative reconstruction technique. COMPARISON:  Non-contrast head CT and CT angiogram head/neck 01/24/2023. FINDINGS: Brain: Generalized cerebral atrophy. Beam hardening artifact at the level of the mid/anterior right temporal lobe. Within this limitation, no definite acute infarct is identified. Chronic cortical/subcortical right MCA territory infarct again noted within the posterior right frontal lobe and right parietal lobe. Background mild patchy and ill-defined hypoattenuation within the cerebral white matter, nonspecific but compatible with chronic small vessel ischemic disease. Chronic infarct within the superior right cerebellar hemisphere again noted. There is no acute intracranial hemorrhage. No extra-axial fluid collection. No evidence of an intracranial mass. No midline shift. Vascular: No hyperdense vessel.  Atherosclerotic calcifications. Skull: No calvarial fracture or aggressive osseous  lesion. Sinuses/Orbits: No mass or acute finding within the imaged orbits. No significant paranasal sinus disease at the imaged levels. IMPRESSION: 1. Beam hardening artifact at the level of the mid/anterior right temporal lobe. Within this limitation, appreciable acute infarct. No intracranial hemorrhage. 2. Parenchymal atrophy, chronic small vessel ischemic disease and chronic infarcts as described. Electronically Signed   By: Jackey Loge D.O.   On: 01/25/2023 10:42    Vitals:   01/25/23 1200 01/25/23 1300 01/25/23 1400 01/25/23 1500  BP: 138/85 136/72 134/81 112/73  Pulse: 69 69 70 69  Resp: (!) 24 (!) 23 17 (!) 22  Temp:      TempSrc:      SpO2: 99% 99% 99% 98%  Weight:      Height:         PHYSICAL EXAM General:  Awake and  alert patient. Appears frail, thin. Psych:  Mood: "okay, considering where I am." Affect: congruent.   CV: Regular rate and rhythm on monitor. Edema of BLLE.  Respiratory: Breathing comfortably on room air at time of exam.  GI: Abdomen soft, non-tender.  NEURO:  Mental Status: Alert and Oriented to self, place, date, situation.  Speech/Language: Patient's voice was mildly hoarse and soft. She has some lingering discomfort from extubation.    Cranial Nerves:  II: PERRL. Visual fields full to hand-waving. 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 III, IV, VI: EOMI. Eyelids elevate symmetrically.  V: Sensation is intact to light touch and symmetrical to face.  VII: Face is symmetrical resting and smiling VIII: hearing intact to voice. IX, X: Palate elevates symmetrically. Phonation is hoarse and soft. WG:[id number] shrug 5/5. XII: tongue is midline without fasciculations. Motor: 5/5 strength to all muscle groups tested.  Tone: is normal and bulk is normal (for thin, elderly patient) Sensation- Intact to light touch bilaterally. Extinction absent to light touch to DSS.   Coordination: FTN intact bilaterally, HKS: no ataxia in BLE.No drift.  Gait- deferred (Patient knows the neuro exam well enough to do most of it unprompted).  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 Will place transfer orders to move her to the progressive neuro floor (3 [facility]). Code Stroke 10/1 CT head - No acute finding. 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   CTA head & neck: Emergent large vessel occlusion at the right M1 segment. Widespread atherosclerosis. High-grade atheromatous stenosis at the non dominant left vertebral origin. CTP 20 cc infarct core with extensive penumbra  S/p IR with complete resolution of  M1/MCA blockage achieving complete recanalization (TICI3). MRA  Not eligible due to pacemaker lead being in atrium per vendor ([employer]) CT Head repeat Beam hardening artifact at the level of the mid/anterior right temporal lobe. Within this limitation, appreciable acute infarct. No intracranial hemorrhage. 2D Echo: EF 60 to 65% LDL 61 HgbA1c 6.6 VTE prophylaxis - Lovenox 30 Q24 Pradaxa (dabigatran) once a day prior to admission, ASA 325 mg daily.  Will consider to resume Pradaxa tomorrow Continue home keppra 500 bid Therapy recommendations: CIR Disposition: Pending  History of stroke/seizure 05/2018 admitted for left-sided numbness weakness, status post TPA, MRI showed right frontal parietal infarct.  CTA head neck showed right P2, left VA, bilateral ICA siphon severe stenosis.  EF 60 to 65%.  Negative for DVT, TEE negative.  LDL 117, A1c 5.9.  Discharged with DAPT and Lipitor 40 30-day CardioNet monitoring showed A. fib patient started on Eliquis. 12/2019  Admitted for right cerebellar infarct.  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  EF 60 to 65%, LDL 81, A1c 5.9.  Eliquis changed to Pradaxa, continue Lipitor 80. 06/2020 admitted for focal seizure started on keppra. MRI negative for stroke. LDL 71 and A1C 6.4. continued on pradaxa 150 bid and keppra.  Followed with [name] NP at [facility]  Atrial fibrillation, chronic  Home Meds: lopressor and pradaxa Taking once a day with pradaxa during the last 5-6 days with COVID and paxlovid Rate controlled ASA 325 mg daily, will resume Pradaxa tomorrow  Respiratory failure, post procedure COVID infection Recent COVID infection for the last 5-6 days Just completed paxlovid course.  Pt still COVID+ on 10/1 re-test.    Hypertension Home meds:  norvasc, cozaar Stable BP goal less than 180/105  long-term BP goal normotensive  Hyperlipidemia Home meds: atorvastatin 80 LDL 61, goal <  70 Resumed home liptior 80 Continue statin at discharge  Dysphagia Speech on board On dysphagia 3 and seen liquid Avoid aspiration  Other Stroke Risk Factors Advanced age  Other Active Problems BPPV  Hospital day # 1   ATTENDING NOTE: I reviewed above note and agree with the assessment and plan. Pt was seen and examined.   Husband and RN are at bedside.  Patient extubated yesterday, tolerating well, awake alert fully orientated.  No aphasia, fluent language, follows simple command, able to name and repeat.  Visual fields full, no gaze palsy.  Facial symmetrical.  Moving all extremities symmetrically.  Sensation symmetrical, no ataxia.  Gait not tested  Patient has dramatic improvement since yesterday, currently neuro exam much improved.  CT repeat showed possible right brain small infarct.  No hemorrhage.  On aspirin 325 today, consider Pradaxa to resume tomorrow.  PT and OT recommend CIR continue Keppra and statin.  For detailed assessment and plan, please refer to above/below as I have made changes wherever appropriate.   Marvel Plan, MD PhD Stroke Neurology 01/25/2023 8:43 PM  This patient is critically ill due to right MCA stroke with right M1 occlusion, status post thrombectomy, chronic A-fib, COVID infection and at significant risk of neurological worsening, death form recurrent stroke, hemorrhagic information, respiratory failure, heart failure. This patient's care requires constant monitoring of vital signs, hemodynamics, respiratory and cardiac monitoring, review of multiple databases, neurological assessment, discussion with family, other specialists and medical decision making of high complexity. I spent 35 minutes of neurocritical care time in the care of this patient. I had long discussion with husband at bedside, updated pt current condition, treatment plan and potential prognosis, and answered all the questions.  He expressed understanding and appreciation.      To  contact Stroke Continuity provider, please refer to [email or web address]. After hours, contact General Neurology

## 2023-01-25 NOTE — Evaluation (Signed)
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) 01/14/2020   CVA (cerebral vascular accident) (HCC) 01/09/2020   Dizziness 11/30/2018   Paroxysmal atrial fibrillation (HCC) 08/13/2018   Prediabetes 06/15/2018   Palpitations 06/05/2018   Essential hypertension 06/05/2018   Hyperlipidemia LDL goal <70 06/05/2018   Acute ischemic right MCA stroke (HCC) 06/05/2018   Stroke (cerebrum) (HCC) - R MCA s/p tPA, embolic, source unknown 05/31/2018   SVT (supraventricular tachycardia) (HCC) 05/28/2015   PVC's (premature ventricular contractions) 05/28/2015   BENIGN POSITIONAL VERTIGO 06/16/2009   Allergic rhinitis 06/16/2009   Past Medical History:  Past Medical History:  Diagnosis Date   Allergy    CVA (cerebral vascular accident) (HCC) 05/2018, 01/09/20   Hyperlipidemia LDL goal <70    Hypertension    Pacemaker    Palpitations    Vertigo, benign positional    Past Surgical History:  Past Surgical History:  Procedure Laterality Date   AV NODE ABLATION N/A 03/30/2020   Procedure: AV NODE ABLATION;  Surgeon: Duke Salvia, MD;  Location: [facility] INVASIVE CV LAB;  Service: Cardiovascular;  Laterality: N/A;   AV NODE ABLATION N/A 03/30/2020   Procedure: AV NODE ABLATION;  Surgeon: Duke Salvia, MD;  Location: [facility] INVASIVE CV LAB;  Service: Cardiovascular;  Laterality: N/A;   CARDIOVERSION Left  10/31/2018   Procedure: CARDIOVERSION;  Surgeon: Elease Hashimoto Deloris Ping, MD;  Location: [facility] ENDOSCOPY;  Service: Cardiovascular;  Laterality: Left;   CARDIOVERSION N/A 01/08/2020   Procedure: CARDIOVERSION;  Surgeon: Parke Poisson, MD;  Location: [facility] ENDOSCOPY;  Service: Cardiovascular;  Laterality: N/A;   Hemilaminectomy and microdiskectomy at L4-5 on the left.  12/10/2003   IR CT HEAD LTD  01/24/2023   IR PERCUTANEOUS ART THROMBECTOMY/INFUSION INTRACRANIAL INC DIAG ANGIO  01/24/2023   IR US GUIDE VASC ACCESS RIGHT  01/24/2023   PACEMAKER IMPLANT N/A 03/30/2020   Procedure: PACEMAKER IMPLANT;  Surgeon: Duke Salvia, MD;  Location: [facility] INVASIVE CV LAB;  Service: Cardiovascular;  Laterality: N/A;   RADIOLOGY WITH ANESTHESIA N/A 01/24/2023   Procedure: IR WITH ANESTHESIA;  Surgeon: Radiologist, Medication, MD;  Location: [facility] OR;  Service: Radiology;  Laterality: N/A;   TEE WITHOUT CARDIOVERSION N/A 06/05/2018   Procedure: TRANSESOPHAGEAL ECHOCARDIOGRAM (TEE);  Surgeon: Lewayne Bunting, MD;  Location: [facility] ENDOSCOPY;  Service: Cardiovascular;  Laterality: N/A;  loop   HPI:  82 year old female with past medical history of stroke, HLD, HTN, PAF on Pradaxa who presented to emergency department 01/24/23 morning as code stroke. Noted to have left-sided weakness, left facial droop, speech change. LKW 10:30PM 01/23/23. LVO+. Incidentally, had recent home COVID+ test on 01/15/23. 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 She was emergently taken to IR for mechanical thrombectomy. Was not given TNK. She was transferred to ICU post-IR intubated on mechanical ventilation. PCCM consulted for vent management and possible COVID pneumonia. intubated 10/1-10/2   Assessment /  Plan / Recommendation Clinical Impression  Pt demonstrates mild dysarthria; no overt weakness but timing and placement of articulators slightly impaired. Pt has mild 'drunk' sounding speech. This is new  according to husband. Pt already using some overarticulation strategies. SLP demonstrates Slow, loud, overarticulation with pacing to pt and husband when reading a passage. Recommend f/u with SLP at next level of care to target dysarthria. Asked pt to practive reading and reciting with strategy.    SLP Assessment  SLP Recommendation/Assessment: Patient needs continued Speech Lanaguage Pathology Services SLP Visit Diagnosis: Dysarthria and anarthria (R47.1)    Recommendations for follow up therapy are one component of a multi-disciplinary discharge planning process, led by the attending physician.  Recommendations may be updated based on patient status, additional functional criteria and insurance authorization.    Follow Up Recommendations  Acute inpatient rehab (3hours/day)    Assistance Recommended at Discharge     Functional Status Assessment Patient has had a recent decline in their functional status and demonstrates the ability to make significant improvements in function in a reasonable and predictable amount of time.  Frequency and Duration min 2x/week  2 weeks      SLP Evaluation Cognition  Overall Cognitive Status: Within Functional Limits for tasks assessed Orientation Level: Oriented X4       Comprehension  Auditory Comprehension Overall Auditory Comprehension: Appears within functional limits for tasks assessed    Expression Verbal Expression Overall Verbal Expression: Appears within functional limits for tasks assessed   Oral / Motor  Oral Motor/Sensory Function Overall Oral Motor/Sensory Function: Within functional limits Motor Speech Overall Motor Speech: Impaired Respiration: Within functional limits Phonation: Normal Resonance: Hypernasality Articulation: Impaired Level of Impairment: Word Intelligibility: Intelligible Motor Planning: Witnin functional limits Motor Speech Errors: Aware;Consistent Interfering Components: Premorbid status Effective  Techniques: Pacing;Over-articulate;Slow rate;Increased vocal intensity            Rashon Westrup, Riley Nearing 01/25/2023, 11:48 AM

## 2023-01-25 NOTE — Evaluation (Signed)
Occupational Therapy Evaluation Patient Details Name: Mary Mercado MRN: [id number] DOB: Apr 20, 1941 Today's Date: 01/25/2023   History of Present Illness Pt presented to the ED 10/1 with c/o L side weakness and slurred speech. She tested positive for covid 9/22. CTA revealed R M1 occlusion. She emergently underwent thrombectomy. PMH: hx of stroke x3, HLD, HTN, PAF, BPPV, s/p pacemaker   Clinical Impression   Pt lives with her husband in [facility] ILF and ambulates with a rollator. She is typically mod I for ADLs. The couple goes to the dining room for one meal a day. Pt reports fine motor and inferior visual field deficits at baseline. Pt presents with L UE and generalized weakness, decreased standing balance and mild L inattention. She requires min assist, in general, for mobility and ADLs. Patient will benefit from intensive inpatient follow up therapy, >3 hours/day.       If plan is discharge home, recommend the following: A little help with walking and/or transfers;A little help with bathing/dressing/bathroom;Assistance with cooking/housework;Assist for transportation;Help with stairs or ramp for entrance    Functional Status Assessment  Patient has had a recent decline in their functional status and demonstrates the ability to make significant improvements in function in a reasonable and predictable amount of time.  Equipment Recommendations  None recommended by OT    Recommendations for Other Services       Precautions / Restrictions Precautions Precautions: Fall;Other (comment) Precaution Comments: covid Restrictions Weight Bearing Restrictions: No      Mobility Bed Mobility Overal bed mobility: Needs Assistance Bed Mobility: Sit to Supine       Sit to supine: Contact guard assist   General bed mobility comments: no physical assist, increased time    Transfers Overall transfer level: Needs assistance Equipment used: 1 person hand held  assist Transfers: Sit to/from Stand, Bed to chair/wheelchair/BSC Sit to Stand: Min assist     Step pivot transfers: Min assist     General transfer comment: steadying assist to return to bed for echo      Balance Overall balance assessment: Needs assistance   Sitting balance-Leahy Scale: Good Sitting balance - Comments: no LOB donning socks at edge of chair   Standing balance support: Single extremity supported Standing balance-Leahy Scale: Poor                             ADL either performed or assessed with clinical judgement   ADL Overall ADL's : Needs assistance/impaired Eating/Feeding: Set up;Sitting Eating/Feeding Details (indicate cue type and reason): using L UE to hold applesauce cup Grooming: Wash/dry hands;Wash/dry face;Sitting;Set up   Upper Body Bathing: Minimal assistance;Sitting   Lower Body Bathing: Minimal assistance;Sit to/from stand   Upper Body Dressing : Set up;Sitting   Lower Body Dressing: Minimal assistance;Sit to/from stand Lower Body Dressing Details (indicate cue type and reason): can don and doff socks Toilet Transfer: Minimal assistance;Stand-pivot;BSC/3in1   Toileting- Clothing Manipulation and Hygiene: Set up;Sitting/lateral lean               Vision Baseline Vision/History: 3 Glaucoma Ability to See in Adequate Light: 1 Impaired Additional Comments: pt reports impaired vision in lower field prior to admission     Perception Perception: Impaired Preception Impairment Details: Inattention/Neglect Perception-Other Comments: mild L inattention   Praxis         Pertinent Vitals/Pain Pain Assessment Pain Assessment: No/denies pain     Extremity/Trunk Assessment Upper Extremity Assessment Upper  Extremity Assessment: Right hand dominant;LUE deficits/detail LUE Deficits / Details: 4/5 strength, had residual fine motor deficits from previous strokes LUE Sensation: decreased proprioception LUE Coordination:  decreased fine motor   Lower Extremity Assessment Lower Extremity Assessment: Defer to PT evaluation   Cervical / Trunk Assessment Cervical / Trunk Assessment: Kyphotic   Communication Communication Communication: No apparent difficulties   Cognition Arousal: Alert Behavior During Therapy: WFL for tasks assessed/performed Overall Cognitive Status: Within Functional Limits for tasks assessed                                       General Comments  VSS on RA, productive cough    Exercises     Shoulder Instructions      Home Living Family/patient expects to be discharged to:: Private residence Living Arrangements: Spouse/significant other Available Help at Discharge: Family;Available 24 hours/day Type of Home: Independent living facility Home Access: Elevator     Home Layout: One level     Bathroom Shower/Tub: Producer, television/film/video: Handicapped height Bathroom Accessibility: Yes   Home Equipment: Rollator (4 wheels);Shower seat - built in;Grab bars - tub/shower;Grab bars - toilet;Hand held shower head   Additional Comments: Pt lives at [facility] area)      Prior Functioning/Environment Prior Level of Function : Independent/Modified Independent             Mobility Comments: rollator for amb ADLs Comments: goes to dining room for 1 meal/day, sits to shower, manages meds with husband        OT Problem List: Decreased strength;Impaired balance (sitting and/or standing);Decreased coordination;Impaired vision/perception      OT Treatment/Interventions: Self-care/ADL training;DME and/or AE instruction;Therapeutic activities;Patient/family education;Balance training;Visual/perceptual remediation/compensation    OT Goals(Current goals can be found in the care plan section) Acute Rehab OT Goals OT Goal Formulation: With patient Time For Goal Achievement: 02/08/23 Potential to Achieve Goals: Good ADL Goals Pt Will  Perform Grooming: with supervision;standing Pt Will Perform Upper Body Dressing: with supervision;sitting Pt Will Perform Lower Body Dressing: with supervision;sit to/from stand Pt Will Transfer to Toilet: with supervision;ambulating Pt Will Perform Toileting - Clothing Manipulation and hygiene: with supervision;sit to/from stand Pt/caregiver will Perform Home Exercise Program: Increased strength;Left upper extremity;With Supervision;With written HEP provided  OT Frequency: Min 3X/week    Co-evaluation              AM-PAC OT "6 Clicks" Daily Activity     Outcome Measure Help from another person eating meals?: A Little Help from another person taking care of personal grooming?: A Little Help from another person toileting, which includes using toliet, bedpan, or urinal?: A Little Help from another person bathing (including washing, rinsing, drying)?: A Little Help from another person to put on and taking off regular upper body clothing?: A Little Help from another person to put on and taking off regular lower body clothing?: A Little 6 Click Score: 18   End of Session Equipment Utilized During Treatment: Gait belt  Activity Tolerance: Patient tolerated treatment well Patient left: in bed;with call bell/phone within reach;with family/visitor present  OT Visit Diagnosis: Unsteadiness on feet (R26.81);Other abnormalities of gait and mobility (R26.89);Muscle weakness (generalized) (M62.81)                Time: 8295-6213 OT Time Calculation (min): 34 min Charges:  OT General Charges $OT Visit: 1 Visit OT Evaluation $OT Eval  Moderate Complexity: 1 Mod OT Treatments $Self Care/Home Management : 8-22 mins  Berna Spare, OTR/L Acute Rehabilitation Services Office: [phone]   Evern Bio 01/25/2023, 11:23 AM

## 2023-01-25 NOTE — Progress Notes (Signed)
17:50H Received patient from 34M ICU Patient was transferred via bed with primary nurse and NT.

## 2023-01-25 NOTE — Progress Notes (Signed)
NAME:  Mary Mercado, MRN:  [id number], DOB:  [birth year], LOS: 1 ADMISSION DATE:  01/24/2023, CONSULTATION DATE:  01/24/2023 REFERRING MD:  Wilford Corner, MD, CHIEF COMPLAINT:  stroke   History of Present Illness:  82 year old female with past medical history of stroke, HLD, HTN, PAF on Pradaxa who presented to emergency department 01/24/23 morning as code stroke. Noted to have left-sided weakness, left facial droop, speech change. LKW 10:30PM 01/23/23. LVO+. Incidentally, had recent home COVID+ test on 01/15/23. 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 She was emergently taken to IR for mechanical thrombectomy. Was not given TNK. She was transferred to ICU post-IR intubated on mechanical ventilation. PCCM consulted for vent management and possible COVID pneumonia.   Pertinent  Medical History  stroke, HLD, HTN, PAF on Pradaxa  Significant Hospital Events: Including procedures, antibiotic start and stop dates in addition to other pertinent events   10/1: code stroke, sp thrombectomy > icu. PCCM consulted vent mgmt  10/2: extubated yesterday afternoon without complication   Interim History / Subjective:  No complaints this morning. NAEON  Objective   Blood pressure 127/67, pulse 69, temperature 97.7 F (36.5 C), temperature source Axillary, resp. rate 20, height 5\' 5"  (1.651 m), weight 47 kg, SpO2 99%.    Vent Mode: PSV;CPAP FiO2 (%):  [36 %-100 %] 36 % Set Rate:  [18 bmp] 18 bmp Vt Set:  [400 mL] 400 mL PEEP:  [5 cmH20] 5 cmH20 Pressure Support:  [8 cmH20] 8 cmH20 Plateau Pressure:  [12 cmH20-13 cmH20] 12 cmH20   Intake/Output Summary (Last 24 hours) at 01/25/2023 0757 Last data filed at 01/25/2023 0645 Gross per 24 hour  Intake 1856.56 ml  Output 2725 ml  Net -868.44 ml   Filed Weights   01/24/23 0600 01/24/23 0913 01/25/23 0530  Weight: 46 kg 47.5 kg 47 kg    Examination: General: elderly female, laying in bed in  NAD HENT: PERRLA, mucous membranes moist  Lungs: clear bilaterally, vented  Cardiovascular: s1s2, regular rate without murmur, rub, gallop  Abdomen: soft, +BS Extremities: cool hands with radial pulses. Doppler R pedal pulse. Unable to obtain doppler left pedal pulse - consistent with previous findings. BLE 1+ pitting edema Neuro: awakens to voice, responds appropriately. Moves all extremities equally well. Generally weak  GU: foley   Resolved Hospital Problem list     Assessment & Plan:    R MCA stroke s/p thrombectomy Plan: -stroke primary; appreciate recs -repeat imaging per neuro -frequent neuro checks -limit sedating meds -SBP goal 120-160 per neuro IR -statin, DAPT per neuro -Continue neuroprotective measures- normothermia, euglycemia, HOB greater than 30, head in neutral alignment, normocapnia, normoxia.  -PT/OT/SLP -lipid panel and echo pending   Post op vent management - now extubated  Recent Covid Positive -recently tested positive and started on paxlovid outpatient on 9/26. Plan: -supportive care -pulm toiletry - oxygen to keep sat >92%   Hx of seizure Plan: - con't Keppra    HTN HLD Plan: -resume statin -consider resuming home bp meds, SBP range currently 120-130 off cleviprex. Consider home meds tomorrow?   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 up to 7 mm Plan: -CT chest when stable      Best Practice (right  click and "Reselect all SmartList Selections" daily)   Diet/type: NPO DVT prophylaxis: SCDs  GI prophylaxis: H2B Lines: n/a Foley: needs to stay in place for now  Code Status:  full code  Last date of multidisciplinary goals of care discussion [per primary]  Labs   CBC: Recent Labs  Lab 01/24/23 0643  01/24/23 1102 01/24/23 2218  WBC  --   --  7.6  NEUTROABS  --   --  5.8  HGB 14.6 13.9 14.6  HCT 43.0 41.0 43.2  MCV  --   --  91.1  PLT  --   --  198    Basic Metabolic Panel: Recent Labs  Lab 01/24/23 0643 01/24/23 1102 01/24/23 2218  NA 140 136 138  K 3.8 3.6 3.5  CL 104  --  102  CO2  --   --  24  GLUCOSE 136*  --  109*  BUN 9  --  5*  CREATININE 0.40*  --  0.57  CALCIUM  --   --  8.7*   GFR: Estimated Creatinine Clearance: 39.5 mL/min (by C-G formula based on SCr of 0.57 mg/dL). Recent Labs  Lab 01/24/23 0644 01/24/23 2218  WBC  --  7.6  LATICACIDVEN 1.7  --     Liver Function Tests: Recent Labs  Lab 01/24/23 2218  AST 29  ALT 21  ALKPHOS 80  BILITOT 1.1  PROT 6.5  ALBUMIN 3.2*   No results for input(s): "LIPASE", "AMYLASE" in the last 168 hours. No results for input(s): "AMMONIA" in the last 168 hours.  ABG    Component Value Date/Time   PHART 7.450 01/24/2023 1102   PCO2ART 35.1 01/24/2023 1102   PO2ART 470 (H) 01/24/2023 1102   HCO3 25.2 01/24/2023 1102   TCO2 26 01/24/2023 1102   O2SAT 100 01/24/2023 1102     Coagulation Profile: Recent Labs  Lab 01/24/23 2218  INR 1.1    Cardiac Enzymes: No results for input(s): "CKTOTAL", "CKMB", "CKMBINDEX", "TROPONINI" in the last 168 hours.  HbA1C: Hgb A1c MFr Bld  Date/Time Value Ref Range Status  08/12/2022 01:41 PM 6.6 (H) 4.6 - 6.5 % Final    Comment:    Glycemic Control Guidelines for People with Diabetes:Non Diabetic:  <6%Goal of Therapy: <7%Additional Action Suggested:  >8%   03/15/2022 07:09 AM 6.8 (H) 4.6 - 6.5 % Final    Comment:    Glycemic Control Guidelines for People with Diabetes:Non Diabetic:  <6%Goal of Therapy: <7%Additional Action Suggested:  >8%     CBG: Recent Labs  Lab 01/24/23 1550 01/24/23 1941 01/24/23 2320 01/25/23 0341 01/25/23 0751  GLUCAP 123* 94 103* 88 104*    Review of Systems:   As per HPI  Past Medical History:  She,  has a past medical  history of Allergy, CVA (cerebral vascular accident) (HCC) (05/2018, 01/09/20), Hyperlipidemia LDL goal <70, Hypertension, Pacemaker, Palpitations, and Vertigo, benign positional.   Surgical History:   Past Surgical History:  Procedure Laterality Date   AV NODE ABLATION N/A 03/30/2020   Procedure: AV NODE ABLATION;  Surgeon: Duke Salvia, MD;  Location[facility] INVASIVE CV LAB;  Service: Cardiovascular;  Laterality: N/A;   AV NODE ABLATION N/A 03/30/2020   Procedure: AV NODE ABLATION;  Surgeon: Duke Salvia, MD;  Location: [facility] INVASIVE CV LAB;  Service: Cardiovascular;  Laterality: N/A;   CARDIOVERSION Left 10/31/2018   Procedure: CARDIOVERSION;  Surgeon: Elease Hashimoto Deloris Ping, MD;  Location: [facility] ENDOSCOPY;  Service: Cardiovascular;  Laterality: Left;  CARDIOVERSION N/A 01/08/2020   Procedure: CARDIOVERSION;  Surgeon: Parke Poisson, MD;  Location: [facility] ENDOSCOPY;  Service: Cardiovascular;  Laterality: N/A;   Hemilaminectomy and microdiskectomy at L4-5 on the left.  12/10/2003   IR CT HEAD LTD  01/24/2023   IR PERCUTANEOUS ART THROMBECTOMY/INFUSION INTRACRANIAL INC DIAG ANGIO  01/24/2023   IR US GUIDE VASC ACCESS RIGHT  01/24/2023   PACEMAKER IMPLANT N/A 03/30/2020   Procedure: PACEMAKER IMPLANT;  Surgeon: Duke Salvia, MD;  Location: [facility] INVASIVE CV LAB;  Service: Cardiovascular;  Laterality: N/A;   RADIOLOGY WITH ANESTHESIA N/A 01/24/2023   Procedure: IR WITH ANESTHESIA;  Surgeon: Radiologist, Medication, MD;  Location: [facility] OR;  Service: Radiology;  Laterality: N/A;   TEE WITHOUT CARDIOVERSION N/A 06/05/2018   Procedure: TRANSESOPHAGEAL ECHOCARDIOGRAM (TEE);  Surgeon: Lewayne Bunting, MD;  Location: [facility] ENDOSCOPY;  Service: Cardiovascular;  Laterality: N/A;  loop     Social History:   reports that she has never smoked. She has never used smokeless tobacco. She reports that she does not drink alcohol and does not use drugs.   Family History:  Her family history includes Colon cancer in her father;  Diabetes in her mother; Heart attack in her mother; Heart attack (age of onset: 64) in her brother. There is no history of Sleep apnea.   Allergies Allergies  Allergen Reactions   Duloxetine     Causes Bp elevation.  Increased body jerks, nausea   Acetaminophen Hives and Rash     Home Medications  Prior to Admission medications   Medication Sig Start Date End Date Taking? Authorizing Provider  amLODipine (NORVASC) 2.5 MG tablet TAKE 1 TABLET BY MOUTH EVERY DAY 01/16/23   Nahser, Deloris Ping, MD  atorvastatin (LIPITOR) 80 MG tablet TAKE 1 TABLET BY MOUTH DAILY AT 6 PM. 12/30/22   Nelwyn Salisbury, MD  dabigatran (PRADAXA) 150 MG CAPS capsule Take 1 capsule (150 mg total) by mouth 2 (two) times daily. 10/17/22   Duke Salvia, MD  dorzolamide (TRUSOPT) 2 % ophthalmic solution 1 drop 2 (two) times daily. 08/10/21   [provider]  dorzolamide-timolol (COSOPT) 2-0.5 % ophthalmic solution 1 drop 2 (two) times daily. 02/26/22   [provider]  levETIRAcetam (KEPPRA) 500 MG tablet Take 1 tablet (500 mg total) by mouth 2 (two) times daily. 03/09/22   [name], NP  losartan (COZAAR) 50 MG tablet TAKE 2 TABLETS BY MOUTH EVERY DAY 01/20/23   Nelwyn Salisbury, MD  metoprolol tartrate (LOPRESSOR) 25 MG tablet TAKE 1/2 TABLET BY MOUTH EVERY DAY AT BEDTIME 08/19/22   Nahser, Deloris Ping, MD  nirmatrelvir/ritonavir (PAXLOVID) 20 x 150 MG & 10 x 100MG  TABS Take 3 tablets by mouth 2 (two) times daily for 5 days. (Take nirmatrelvir 150 mg two tablets twice daily for 5 days and ritonavir 100 mg one tablet twice daily for 5 days) Patient GFR is 82 01/19/23 01/24/23  Nelwyn Salisbury, MD  Tafluprost, PF, 0.0015 % SOLN Apply 1 drop to eye daily.    [provider]     Critical care time: 41    Herold [facility] 01/25/2023, 8:09 AM  Please see [email or web address] for pager details.  From 7A-7P if no response, please call [phone]. After hours, please call ELink  [phone].

## 2023-01-25 NOTE — Progress Notes (Signed)
Inpatient Rehab Admissions Coordinator Note:   Per PT recommendations patient was screened for CIR candidacy by Stephania Fragmin, PT. At this time, pt appears to be a potential candidate for CIR. I will place an order for rehab consult for full assessment, per our protocol.  Please contact me any with questions.Estill Dooms, PT, DPT [phone] 01/25/23 10:52 AM

## 2023-01-25 NOTE — Evaluation (Signed)
Physical Therapy Evaluation Patient Details Name: Mary Mercado MRN: [id number] DOB: 1940-07-02 Today's Date: 01/25/2023  History of Present Illness  Pt presented to the ED 10/1 with c/o L side weakness and slurred speech. She tested positive for covid 9/22. CTA revealed R M1 occlusion. She emergently underwent thrombectomy. PMH: hx of stroke x3, HLD, HTN, PAF, BPPV, s/p pacemaker   Clinical Impression  Pt admitted with above diagnosis. PTA pt lived with her husband at [facility] ILF, mod I mobility/ADLs with rollator. Pt currently with functional limitations due to the deficits listed below (see PT Problem List). On eval, she required min assist bed mobility, min assist transfers, and min assist amb 5' with RW. VSS on RA. Pt generally weak and unable to discern if asymmetry present BLE. Mild L inattention noted. Pt will benefit from acute skilled PT to increase their independence and safety with mobility to allow discharge.           If plan is discharge home, recommend the following: A little help with walking and/or transfers;A little help with bathing/dressing/bathroom;Assistance with cooking/housework;Assist for transportation   Can travel by private vehicle        Equipment Recommendations None recommended by PT  Recommendations for Other Services  Rehab consult    Functional Status Assessment Patient has had a recent decline in their functional status and demonstrates the ability to make significant improvements in function in a reasonable and predictable amount of time.     Precautions / Restrictions Precautions Precautions: Fall;Other (comment) Precaution Comments: covid      Mobility  Bed Mobility Overal bed mobility: Needs Assistance Bed Mobility: Supine to Sit     Supine to sit: Min assist, Used rails, HOB elevated     General bed mobility comments: assist to elevate trunk and scoot to EOB, increased time    Transfers Overall transfer level:  Needs assistance Equipment used: Rolling walker (2 wheels) Transfers: Sit to/from Stand             General transfer comment: cues for hand placement and sequencing, increased time    Ambulation/Gait Ambulation/Gait assistance: Min assist Gait Distance (Feet): 5 Feet Assistive device: Rolling walker (2 wheels) Gait Pattern/deviations: Step-through pattern, Decreased stride length Gait velocity: decreased     General Gait Details: amb bed to recliner with RW. VSS on RA  Stairs            Wheelchair Mobility     Tilt Bed    Modified Rankin (Stroke Patients Only) Modified Rankin (Stroke Patients Only) Pre-Morbid Rankin Score: Slight disability Modified Rankin: Moderately severe disability     Balance Overall balance assessment: Needs assistance Sitting-balance support: Feet supported, Single extremity supported Sitting balance-Leahy Scale: Fair     Standing balance support: Bilateral upper extremity supported, During functional activity, Reliant on assistive device for balance Standing balance-Leahy Scale: Poor                               Pertinent Vitals/Pain Pain Assessment Pain Assessment: No/denies pain    Home Living Family/patient expects to be discharged to:: Private residence Living Arrangements: Spouse/significant other Available Help at Discharge: Family;Available 24 hours/day Type of Home: Independent living facility Home Access: Elevator       Home Layout: One level Home Equipment: Rollator (4 wheels);Shower seat - built in;Grab bars - tub/shower;Grab bars - toilet;Hand held shower head Additional Comments: Pt lives at [facility] [employer]  area)    Prior Function Prior Level of Function : Independent/Modified Independent             Mobility Comments: rollator for amb ADLs Comments: goes to dining room for 1 meal/day     Extremity/Trunk Assessment   Upper Extremity Assessment Upper Extremity  Assessment: Defer to OT evaluation    Lower Extremity Assessment Lower Extremity Assessment: Generalized weakness (unable to discern symmetry as pt is generally weak.)    Cervical / Trunk Assessment Cervical / Trunk Assessment: Kyphotic  Communication   Communication Communication: No apparent difficulties  Cognition Arousal: Alert Behavior During Therapy: WFL for tasks assessed/performed Overall Cognitive Status: Within Functional Limits for tasks assessed                                          General Comments General comments (skin integrity, edema, etc.): VSS on RA, productive cough    Exercises     Assessment/Plan    PT Assessment Patient needs continued PT services  PT Problem List Decreased strength;Decreased balance;Decreased mobility;Decreased activity tolerance       PT Treatment Interventions Gait training;Functional mobility training;Balance training;Patient/family education;Therapeutic activities;Therapeutic exercise    PT Goals (Current goals can be found in the Care Plan section)  Acute Rehab PT Goals Patient Stated Goal: rehab then home PT Goal Formulation: With patient Time For Goal Achievement: 02/08/23 Potential to Achieve Goals: Good    Frequency Min 1X/week     Co-evaluation               AM-PAC PT "6 Clicks" Mobility  Outcome Measure Help needed turning from your back to your side while in a flat bed without using bedrails?: A Little Help needed moving from lying on your back to sitting on the side of a flat bed without using bedrails?: A Little Help needed moving to and from a bed to a chair (including a wheelchair)?: A Little Help needed standing up from a chair using your arms (e.g., wheelchair or bedside chair)?: A Little Help needed to walk in hospital room?: A Little Help needed climbing 3-5 steps with a railing? : Total 6 Click Score: 16    End of Session Equipment Utilized During Treatment: Gait  belt Activity Tolerance: Patient tolerated treatment well Patient left: in chair;with call bell/phone within reach;with chair alarm set Nurse Communication: Mobility status PT Visit Diagnosis: Other abnormalities of gait and mobility (R26.89)    Time: 9528-4132 PT Time Calculation (min) (ACUTE ONLY): 30 min   Charges:   PT Evaluation $PT Eval Moderate Complexity: 1 Mod PT Treatments $Therapeutic Activity: 8-22 mins PT General Charges $$ ACUTE PT VISIT: 1 Visit         Ferd Glassing., PT  Office # [phone]   Ilda Foil 01/25/2023, 10:22 AM

## 2023-01-25 NOTE — Plan of Care (Signed)
  Problem: Coping: Goal: Will verbalize positive feelings about self Outcome: Progressing   Problem: Self-Care: Goal: Ability to communicate needs accurately will improve Outcome: Progressing   

## 2023-01-25 NOTE — Progress Notes (Signed)
Referring Physician(s): Dr Jerel Shepherd  Supervising Physician: Joana Reamer Kizzie Fantasia  Patient Status:  [facility] - In-pt  Chief Complaint:  CVA  Subjective:  NIR procedure 10/1:  Occlusion of the mid right M1/MCA. Mechanical thrombectomy performed with direct contact aspiration (x1) achieving complete recanalization (TICI 3)  Up in chair today Speaking well; coherent and clear Moving all 4s and follows all commands  Dr Jasmine December has seen and examined pt  Allergies: Duloxetine and Acetaminophen  Medications: Prior to Admission medications   Medication Sig Start Date End Date Taking? Authorizing Provider  amLODipine (NORVASC) 2.5 MG tablet TAKE 1 TABLET BY MOUTH EVERY DAY 01/16/23  Yes Nahser, Deloris Ping, MD  atorvastatin (LIPITOR) 80 MG tablet TAKE 1 TABLET BY MOUTH DAILY AT 6 PM. 12/30/22  Yes Nelwyn Salisbury, MD  dabigatran (PRADAXA) 150 MG CAPS capsule Take 1 capsule (150 mg total) by mouth 2 (two) times daily. 10/17/22  Yes Duke Salvia, MD  dorzolamide-timolol (COSOPT) 2-0.5 % ophthalmic solution Place 1 drop into both eyes 2 (two) times daily. 02/26/22  Yes [provider]  fluorouracil (EFUDEX) 5 % cream Apply 1 Application topically 2 (two) times daily. 01/03/23  Yes [provider]  levETIRAcetam (KEPPRA) 500 MG tablet Take 1 tablet (500 mg total) by mouth 2 (two) times daily. 03/09/22  Yes McCue, Shanda Bumps, NP  losartan (COZAAR) 50 MG tablet TAKE 2 TABLETS BY MOUTH EVERY DAY 01/20/23  Yes Nelwyn Salisbury, MD  metoprolol tartrate (LOPRESSOR) 25 MG tablet TAKE 1/2 TABLET BY MOUTH EVERY DAY AT BEDTIME 08/19/22  Yes Nahser, Deloris Ping, MD  Tafluprost, PF, 0.0015 % SOLN Place 1 drop into both eyes daily.   Yes [provider]     Vital Signs: BP (!) 154/86   Pulse 69   Temp 97.7 F (36.5 C) (Axillary)   Resp 17   Ht 5\' 5"  (1.651 m)   Wt 103 lb 9.9 oz (47 kg)   SpO2 100%   BMI 17.24 kg/m   Physical Exam Vitals reviewed.  HENT:     Mouth/Throat:      Mouth: Mucous membranes are moist.  Musculoskeletal:        General: Normal range of motion.     Comments: Moving all 4s and follows all commands Rt groin NT no bleeding No hematoma and soft  Skin:    General: Skin is warm.     Comments: Extremities cold Better with movement Rt hand seemed "purplish color"--- movement and warming cleared skin color Good Rt wrist pulses  Neurological:     Mental Status: She is alert and oriented to person, place, and time.  Psychiatric:        Behavior: Behavior normal.     Imaging: DG Abd Portable 1V  Result Date: 01/24/2023 CLINICAL DATA:  Orogastric tube placement EXAM: PORTABLE ABDOMEN - 1 VIEW COMPARISON:  CT 02/01/2011 FINDINGS: Gastric tube extends into the decompressed stomach. Visualized bowel gas pattern unremarkable. visualized lung bases clear. No abnormal abdominal calcifications. Lower abdomen excluded. Regional bones unremarkable. IMPRESSION: Gastric tube into decompressed stomach. Electronically Signed   By: Corlis Leak M.D.   On: 01/24/2023 12:24   DG Chest Port 1 View  Result Date: 01/24/2023 CLINICAL DATA:  Intubation EXAM: PORTABLE CHEST - 1 VIEW COMPARISON:  03/31/2020 FINDINGS: Endotracheal tube tip 6.5 cm above carina. Coarse attenuated bronchovascular markings with interstitial opacities in both upper lobes. 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 Heart size and mediastinal contours are  within normal limits. Aortic Atherosclerosis (ICD10-170.0). No effusion. Visualized bones unremarkable. IMPRESSION: 1. Endotracheal tube tip 6.5 cm above carina. 2. Bilateral upper lobe interstitial opacities. Electronically Signed   By: Corlis Leak M.D.   On: 01/24/2023 12:17   IR PERCUTANEOUS ART THROMBECTOMY/INFUSION INTRACRANIAL INC DIAG ANGIO  Result Date: 01/24/2023 INDICATION: 82 year old female presenting with acute left-sided weakness, left facial droop and slurred speech; NIHSS 7. Her last known well was 10:30 p.m. on  01/23/2023. Her past medical history is significant for prior strokes with mild residual left-sided weakness, hypertension, hyperlipidemia, paroxysmal atrial fibrillation on Pradaxa having failed Eliquis, recently tested positive for COVID on 01/15/2023. Premorbid modified Rankin scale 2. Head CT showed no acute findings. CT angiogram of the head and neck showed a right M1/MCA occlusion. She was transferred to our service for mechanical thrombectomy. EXAM: ULTRASOUND-GUIDED VASCULAR ACCESS DIAGNOSTIC CEREBRAL ANGIOGRAM MECHANICAL THROMBECTOMY FLAT PANEL HEAD CT COMPARISON:  CT/CT angiogram of the head and neck 01/24/2023. MEDICATIONS: Ancef 2 g IV. ANESTHESIA/SEDATION: The procedure was performed under general anesthesia. CONTRAST:  45 mL of Omnipaque 300 milligram/mL FLUOROSCOPY: Radiation Exposure Index (as provided by the fluoroscopic device): 355 mGy Kerma COMPLICATIONS: None immediate. TECHNIQUE: Informed written consent was obtained from the patient's husband after a thorough discussion of the procedural risks, benefits and alternatives. All questions were addressed. Maximal Sterile Barrier Technique was utilized including caps, mask, sterile gowns, sterile gloves, sterile drape, hand hygiene and skin antiseptic. A timeout was performed prior to the initiation of the procedure. The right groin was prepped and draped in the usual sterile fashion. Using a micropuncture kit and the modified Seldinger technique, access was gained to the right common femoral artery and an 8 French sheath was placed. Real-time ultrasound guidance was utilized for vascular access including the acquisition of a permanent ultrasound image documenting patency of the accessed vessel. Under fluoroscopy, a Zoom 88 guide catheter was navigated over a 6 [place] VTK catheter and a 0.035" Terumo Glidewire into the aortic arch. The catheter was placed into the right common carotid artery and then advanced into the right internal carotid artery.  The diagnostic catheter was removed. Frontal and lateral angiograms of the head were obtained. FINDINGS: 1. Ultrasound showed mild atherosclerotic changes of the right common femoral artery without significant stenosis. The caliber is adequate for vascular access. 2. Occlusion of the right M1/MCA with moderate leptomeningeal collateral flow from the right ACA to the right MCA territory. 3. Mild luminal irregularity of the right carotid siphon related to atherosclerotic disease without hemodynamically significant stenosis. A 2-3 mm outpouching from the communicating segment of the right ICA corresponds to a PCOM infundibulum. PROCEDURE: Using biplane roadmap guidance, a Cereglide 71 aspiration catheter was navigated over Colossus 35 microguidewire into the cavernous segment of the right ICA. The aspiration catheter was then advanced to the level of occlusion and connected to an aspiration pump. Continuous aspiration was performed for 2 minutes. The guide catheter was connected to a VacLok syringe. The aspiration catheter was subsequently removed under constant aspiration. The guide catheter was aspirated for debris. Right internal carotid artery angiograms with frontal lateral views of the head showed complete recanalization of the right MCA vascular tree (TICI 3). The catheter was then retracted into the right common carotid artery. Frontal and lateral angiograms of the neck were obtained showing atherosclerotic disease of the right carotid bifurcation without hemodynamically significant stenosis. Flat panel CT of the head was obtained and post processed in a separate workstation with concurrent attending physician  supervision. Selected images were sent to PACS. No hemorrhagic complication. The catheter was subsequently withdrawn. Right common femoral artery angiogram was obtained in right anterior oblique view. The puncture is at the level of the common femoral artery. The artery has normal caliber, adequate for  closure device. The sheath was exchanged over the wire for a Perclose prostyle which was utilized for access closure. Immediate hemostasis was achieved. IMPRESSION: Successful and uncomplicated mechanical thrombectomy for treatment of a right M1/MCA occlusion achieving complete recanalization (TICI 3). PLAN: Transfer to ICU. Electronically Signed   By: Baldemar Lenis M.D.   On: 01/24/2023 12:07   IR CT Head Ltd  Result Date: 01/24/2023 INDICATION: 82 year old female presenting with acute left-sided weakness, left facial droop and slurred speech; NIHSS 7. Her last known well was 10:30 p.m. on 01/23/2023. Her past medical history is significant for prior strokes with mild residual left-sided weakness, hypertension, hyperlipidemia, paroxysmal atrial fibrillation on Pradaxa having failed Eliquis, recently tested positive for COVID on 01/15/2023. Premorbid modified Rankin scale 2. Head CT showed no acute findings. CT angiogram of the head and neck showed a right M1/MCA occlusion. She was transferred to our service for mechanical thrombectomy. EXAM: ULTRASOUND-GUIDED VASCULAR ACCESS DIAGNOSTIC CEREBRAL ANGIOGRAM MECHANICAL THROMBECTOMY FLAT PANEL HEAD CT COMPARISON:  CT/CT angiogram of the head and neck 01/24/2023. MEDICATIONS: Ancef 2 g IV. ANESTHESIA/SEDATION: The procedure was performed under general anesthesia. CONTRAST:  45 mL of Omnipaque 300 milligram/mL FLUOROSCOPY: Radiation Exposure Index (as provided by the fluoroscopic device): 355 mGy Kerma COMPLICATIONS: None immediate. TECHNIQUE: Informed written consent was obtained from the patient's husband after a thorough discussion of the procedural risks, benefits and alternatives. All questions were addressed. Maximal Sterile Barrier Technique was utilized including caps, mask, sterile gowns, sterile gloves, sterile drape, hand hygiene and skin antiseptic. A timeout was performed prior to the initiation of the procedure. The right groin was prepped  and draped in the usual sterile fashion. Using a micropuncture kit and the modified Seldinger technique, access was gained to the right common femoral artery and an 8 French sheath was placed. Real-time ultrasound guidance was utilized for vascular access including the acquisition of a permanent ultrasound image documenting patency of the accessed vessel. Under fluoroscopy, a Zoom 88 guide catheter was navigated over a 6 [place] VTK catheter and a 0.035" Terumo Glidewire into the aortic arch. The catheter was placed into the right common carotid artery and then advanced into the right internal carotid artery. The diagnostic catheter was removed. Frontal and lateral angiograms of the head were obtained. FINDINGS: 1. Ultrasound showed mild atherosclerotic changes of the right common femoral artery without significant stenosis. The caliber is adequate for vascular access. 2. Occlusion of the right M1/MCA with moderate leptomeningeal collateral flow from the right ACA to the right MCA territory. 3. Mild luminal irregularity of the right carotid siphon related to atherosclerotic disease without hemodynamically significant stenosis. A 2-3 mm outpouching from the communicating segment of the right ICA corresponds to a PCOM infundibulum. PROCEDURE: Using biplane roadmap guidance, a Cereglide 71 aspiration catheter was navigated over Colossus 35 microguidewire into the cavernous segment of the right ICA. The aspiration catheter was then advanced to the level of occlusion and connected to an aspiration pump. Continuous aspiration was performed for 2 minutes. The guide catheter was connected to a VacLok syringe. The aspiration catheter was subsequently removed under constant aspiration. The guide catheter was aspirated for debris. Right internal carotid artery angiograms with frontal lateral views of the head  showed complete recanalization of the right MCA vascular tree (TICI 3). The catheter was then retracted into the right  common carotid artery. Frontal and lateral angiograms of the neck were obtained showing atherosclerotic disease of the right carotid bifurcation without hemodynamically significant stenosis. Flat panel CT of the head was obtained and post processed in a separate workstation with concurrent attending physician supervision. Selected images were sent to PACS. No hemorrhagic complication. The catheter was subsequently withdrawn. Right common femoral artery angiogram was obtained in right anterior oblique view. The puncture is at the level of the common femoral artery. The artery has normal caliber, adequate for closure device. The sheath was exchanged over the wire for a Perclose prostyle which was utilized for access closure. Immediate hemostasis was achieved. IMPRESSION: Successful and uncomplicated mechanical thrombectomy for treatment of a right M1/MCA occlusion achieving complete recanalization (TICI 3). PLAN: Transfer to ICU. Electronically Signed   By: Baldemar Lenis M.D.   On: 01/24/2023 12:07   IR US Guide Vasc Access Right  Result Date: 01/24/2023 INDICATION: 82 year old female presenting with acute left-sided weakness, left facial droop and slurred speech; NIHSS 7. Her last known well was 10:30 p.m. on 01/23/2023. Her past medical history is significant for prior strokes with mild residual left-sided weakness, hypertension, hyperlipidemia, paroxysmal atrial fibrillation on Pradaxa having failed Eliquis, recently tested positive for COVID on 01/15/2023. Premorbid modified Rankin scale 2. Head CT showed no acute findings. CT angiogram of the head and neck showed a right M1/MCA occlusion. She was transferred to our service for mechanical thrombectomy. EXAM: ULTRASOUND-GUIDED VASCULAR ACCESS DIAGNOSTIC CEREBRAL ANGIOGRAM MECHANICAL THROMBECTOMY FLAT PANEL HEAD CT COMPARISON:  CT/CT angiogram of the head and neck 01/24/2023. MEDICATIONS: Ancef 2 g IV. ANESTHESIA/SEDATION: The procedure was  performed under general anesthesia. CONTRAST:  45 mL of Omnipaque 300 milligram/mL FLUOROSCOPY: Radiation Exposure Index (as provided by the fluoroscopic device): 355 mGy Kerma COMPLICATIONS: None immediate. TECHNIQUE: Informed written consent was obtained from the patient's husband after a thorough discussion of the procedural risks, benefits and alternatives. All questions were addressed. Maximal Sterile Barrier Technique was utilized including caps, mask, sterile gowns, sterile gloves, sterile drape, hand hygiene and skin antiseptic. A timeout was performed prior to the initiation of the procedure. The right groin was prepped and draped in the usual sterile fashion. Using a micropuncture kit and the modified Seldinger technique, access was gained to the right common femoral artery and an 8 French sheath was placed. Real-time ultrasound guidance was utilized for vascular access including the acquisition of a permanent ultrasound image documenting patency of the accessed vessel. Under fluoroscopy, a Zoom 88 guide catheter was navigated over a 6 [place] VTK catheter and a 0.035" Terumo Glidewire into the aortic arch. The catheter was placed into the right common carotid artery and then advanced into the right internal carotid artery. The diagnostic catheter was removed. Frontal and lateral angiograms of the head were obtained. FINDINGS: 1. Ultrasound showed mild atherosclerotic changes of the right common femoral artery without significant stenosis. The caliber is adequate for vascular access. 2. Occlusion of the right M1/MCA with moderate leptomeningeal collateral flow from the right ACA to the right MCA territory. 3. Mild luminal irregularity of the right carotid siphon related to atherosclerotic disease without hemodynamically significant stenosis. A 2-3 mm outpouching from the communicating segment of the right ICA corresponds to a PCOM infundibulum. PROCEDURE: Using biplane roadmap guidance, a Cereglide 71  aspiration catheter was navigated over Colossus 35 microguidewire into the cavernous segment  of the right ICA. The aspiration catheter was then advanced to the level of occlusion and connected to an aspiration pump. Continuous aspiration was performed for 2 minutes. The guide catheter was connected to a VacLok syringe. The aspiration catheter was subsequently removed under constant aspiration. The guide catheter was aspirated for debris. Right internal carotid artery angiograms with frontal lateral views of the head showed complete recanalization of the right MCA vascular tree (TICI 3). The catheter was then retracted into the right common carotid artery. Frontal and lateral angiograms of the neck were obtained showing atherosclerotic disease of the right carotid bifurcation without hemodynamically significant stenosis. Flat panel CT of the head was obtained and post processed in a separate workstation with concurrent attending physician supervision. Selected images were sent to PACS. No hemorrhagic complication. The catheter was subsequently withdrawn. Right common femoral artery angiogram was obtained in right anterior oblique view. The puncture is at the level of the common femoral artery. The artery has normal caliber, adequate for closure device. The sheath was exchanged over the wire for a Perclose prostyle which was utilized for access closure. Immediate hemostasis was achieved. IMPRESSION: Successful and uncomplicated mechanical thrombectomy for treatment of a right M1/MCA occlusion achieving complete recanalization (TICI 3). PLAN: Transfer to ICU. Electronically Signed   By: Baldemar Lenis M.D.   On: 01/24/2023 12:07   CT ANGIO HEAD NECK W WO CM W PERF (CODE STROKE)  Result Date: 01/24/2023 CLINICAL DATA:  Acute stroke workup EXAM: CT ANGIOGRAPHY HEAD AND NECK CT PERFUSION BRAIN TECHNIQUE: Multidetector CT imaging of the head and neck was performed using the standard protocol during bolus  administration of intravenous contrast. Multiplanar CT image reconstructions and MIPs were obtained to evaluate the vascular anatomy. Carotid stenosis measurements (when applicable) are obtained utilizing NASCET criteria, using the distal internal carotid diameter as the denominator. Multiphase CT imaging of the brain was performed following IV bolus contrast injection. Subsequent parametric perfusion maps were calculated using RAPID software. RADIATION DOSE REDUCTION: This exam was performed according to the departmental dose-optimization program which includes automated exposure control, adjustment of the mA and/or kV according to patient size and/or use of iterative reconstruction technique. CONTRAST:  OMNIPAQUE IOHEXOL 350 MG/ML SOLN COMPARISON:  06/28/2020 FINDINGS: CTA NECK FINDINGS Aortic arch: Atheromatous plaque. The brachiocephalic origin is not covered. Right carotid system: Mixed density plaque at the bifurcation. Proximal ICA stenosis measures less than 50%. No ulceration or beading. Left carotid system: Mainly calcified plaque at the bifurcation without significant stenosis or any ulceration. Vertebral arteries: Proximal subclavian atherosclerosis without flow reducing stenosis. Atheromatous plaque at the vertebral origins, greater on the left where there is high-grade narrowing. The right vertebral artery is dominant to a strong degree. Skeleton: No acute finding. Other neck: No acute finding Upper chest: Numerous new small pulmonary nodules in the upper lungs measuring up to 7 mm at the right apex. Review of the MIP images confirms the above findings CTA HEAD FINDINGS Anterior circulation: Abrupt cut off of the right M1 segment with poor downstream flow. Confluent atheromatous calcification of the cavernous carotids. No additional embolism/occlusion is seen. Negative for aneurysm. Generalized atheromatous irregularity of medium size branches. Posterior circulation: Atheromatous calcification of  the vertebral and basilar arteries without flow reducing stenosis. No branch occlusion, beading, or aneurysm. Fetal type left PCA flow. Bilateral atheromatous irregularity of the posterior cerebral arteries with moderate right P2 segment stenosis. Venous sinuses: None significant Anatomic variants: As above Review of the MIP images confirms  the above findings CT Brain Perfusion Findings: CBF (<30%) Volume: 23 cc mainly at the right temporal lobemL Perfusion (Tmax>6.0s) volume: 228 cc, overestimated given the degree of cerebellar involvement. There is exclusion of the chronic right MCA branch infarct however.ML Mismatch Volume: Infarction Location:Right temporal Critical Value/emergent results were called by telephone at the time of interpretation on 01/24/2023 at 7:03 am to provider [facility] , who verbally acknowledged these results. IMPRESSION: 1. Emergent large vessel occlusion at the right M1 segment. Reported 20 cc infarct core; there is over estimation of the surrounding penumbra although still extensive. 2. Widespread atherosclerosis without flow reducing stenosis or clear embolic source underlying the infarct. 3. High-grade atheromatous stenosis at the non dominant left vertebral origin. 4. Pulmonary nodules in the apical lungs measuring up to 7 mm. Recommend chest CT follow-up after convalescence. Electronically Signed   By: Tiburcio Pea M.D.   On: 01/24/2023 07:13   CT HEAD CODE STROKE WO CONTRAST  Result Date: 01/24/2023 CLINICAL DATA:  Code stroke.  Slurred speech EXAM: CT HEAD WITHOUT CONTRAST TECHNIQUE: Contiguous axial images were obtained from the base of the skull through the vertex without intravenous contrast. RADIATION DOSE REDUCTION: This exam was performed according to the departmental dose-optimization program which includes automated exposure control, adjustment of the mA and/or kV according to patient size and/or use of iterative reconstruction technique. COMPARISON:  Brain  MRI 06/29/2020 FINDINGS: Brain: No evidence of acute infarction, hemorrhage, hydrocephalus, extra-axial collection or mass lesion/mass effect. Chronic right superior cerebellar and MCA branch infarct affecting the posterior frontal lobe. Chronic small vessel ischemic gliosis in the cerebral white matter. Mild for age cerebral volume loss. Vascular: Extensive atheromatous calcification Skull: Normal. Negative for fracture or focal lesion. Sinuses/Orbits: No acute finding. Other: These results were communicated to Dr. Wilford Corner at 6:50 am on 01/24/2023 by epic chat ASPECTS [facility] Stroke Program Early CT Score) on the left - Ganglionic level infarction (caudate, lentiform nuclei, internal capsule, insula, M1-M3 cortex): 7 - Supraganglionic infarction (M4-M6 cortex): 3 Total score (0-10 with 10 being normal): 10 IMPRESSION: 1. No acute finding. 2. 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 Electronically Signed   By: Tiburcio Pea M.D.   On: 01/24/2023 06:52    Labs:  CBC: Recent Labs    03/15/22 0709 01/24/23 0643 01/24/23 1102 01/24/23 2218 01/25/23 0818  WBC 4.8  --   --  7.6 7.7  HGB 14.9 14.6 13.9 14.6 17.5*  HCT 44.2 43.0 41.0 43.2 52.7*  PLT 153.0  --   --  198 168    COAGS: Recent Labs    01/24/23 2218  INR 1.1  APTT 26    BMP: Recent Labs    03/15/22 0709 08/12/22 1341 01/24/23 0643 01/24/23 1102 01/24/23 2218 01/25/23 0818  NA 139 138 140 136 138 136  K 4.2 4.7 3.8 3.6 3.5 3.5  CL 103 103 104  --  102 101  CO2 30 29  --   --  24 21*  GLUCOSE 122* 194* 136*  --  109* 95  BUN 9 16 9   --  5* 8  CALCIUM 9.3 9.4  --   --  8.7* 8.5*  CREATININE 0.66 0.64 0.40*  --  0.57 0.68  GFRNONAA  --   --   --   --  >60 >60    LIVER FUNCTION TESTS: Recent Labs    03/15/22 0709 01/24/23 2218  BILITOT 1.4* 1.1  AST  28 29  ALT 22 21  ALKPHOS 65 80  PROT 7.2 6.5  ALBUMIN 4.2 3.2*    Assessment and Plan:  CVA Post NIR  procedure 10/1: Occlusion of the mid right M1/MCA. Mechanical thrombectomy performed with direct contact aspiration (x1) achieving complete recanalization  NIR sign off--- call if any needs  Electronically Signed: Robet Leu, PA-C 01/25/2023, 10:12 AM   I spent a total of 15 Minutes at the the patient's bedside AND on the patient's hospital floor or unit, greater than 50% of which was counseling/coordinating care for R MCA revascularization

## 2023-01-25 NOTE — Evaluation (Signed)
Clinical/Bedside Swallow Evaluation Patient Details  Name: Mary Mercado MRN: [id number] Date of Birth: [birth year]  Today's Date: 01/25/2023 Time: SLP Start Time (ACUTE ONLY): 1010 SLP Stop Time (ACUTE ONLY): 1038 SLP Time Calculation (min) (ACUTE ONLY): 28 min  Past Medical History:  Past Medical History:  Diagnosis Date   Allergy    CVA (cerebral vascular accident) (HCC) 05/2018, 01/09/20   Hyperlipidemia LDL goal <70    Hypertension    Pacemaker    Palpitations    Vertigo, benign positional    Past Surgical History:  Past Surgical History:  Procedure Laterality Date   AV NODE ABLATION N/A 03/30/2020   Procedure: AV NODE ABLATION;  Surgeon: Duke Salvia, MD;  Location: [facility] INVASIVE CV LAB;  Service: Cardiovascular;  Laterality: N/A;   AV NODE ABLATION N/A 03/30/2020   Procedure: AV NODE ABLATION;  Surgeon: Duke Salvia, MD;  Location: [facility] INVASIVE CV LAB;  Service: Cardiovascular;  Laterality: N/A;   CARDIOVERSION Left 10/31/2018   Procedure: CARDIOVERSION;  Surgeon: Elease Hashimoto Deloris Ping, MD;  Location: [facility] ENDOSCOPY;  Service: Cardiovascular;  Laterality: Left;   CARDIOVERSION N/A 01/08/2020   Procedure: CARDIOVERSION;  Surgeon: Parke Poisson, MD;  Location: [facility] ENDOSCOPY;  Service: Cardiovascular;  Laterality: N/A;   Hemilaminectomy and microdiskectomy at L4-5 on the left.  12/10/2003   IR CT HEAD LTD  01/24/2023   IR PERCUTANEOUS ART THROMBECTOMY/INFUSION INTRACRANIAL INC DIAG ANGIO  01/24/2023   IR US GUIDE VASC ACCESS RIGHT  01/24/2023   PACEMAKER IMPLANT N/A 03/30/2020   Procedure: PACEMAKER IMPLANT;  Surgeon: Duke Salvia, MD;  Location: [facility] INVASIVE CV LAB;  Service: Cardiovascular;  Laterality: N/A;   RADIOLOGY WITH ANESTHESIA N/A 01/24/2023   Procedure: IR WITH ANESTHESIA;  Surgeon: Radiologist, Medication, MD;  Location: [facility] OR;  Service: Radiology;  Laterality: N/A;   TEE WITHOUT CARDIOVERSION N/A 06/05/2018   Procedure: TRANSESOPHAGEAL ECHOCARDIOGRAM (TEE);   Surgeon: Lewayne Bunting, MD;  Location: [facility] ENDOSCOPY;  Service: Cardiovascular;  Laterality: N/A;  loop   HPI:  82 year old female with past medical history of stroke, HLD, HTN, PAF on Pradaxa who presented to emergency department 01/24/23 morning as code stroke. Noted to have left-sided weakness, left facial droop, speech change. LKW 10:30PM 01/23/23. LVO+. Incidentally, had recent home COVID+ test on 01/15/23. 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 She was emergently taken to IR for mechanical thrombectomy. Was not given TNK. She was transferred to ICU post-IR intubated on mechanical ventilation. PCCM consulted for vent management and possible COVID pneumonia. intubated 10/1-10/2    Assessment / Plan / Recommendation  Clinical Impression  Pt verbalizes a history of mild dysphagia following prior stroke, She has to be very careful and attentive to take small sips and chew her food well. Today pt is mildly dysarthric though ROM and strength good. Rate and timing appear impaired resulting in slightly ataxic appearing dysarthria. Pt was seen fully upright in chair, alert and able to feed herself. SLP cued her to holdher head in a neutral position instead of tilted back. She held her head appropriately throughout assessment thereafter. Pt observed to self feed with a slow careful rate, small single sips. Pt had some initial throat clearing and had an audible swallow throughout. Pt and husband both feel her swallowing is normal for her and pt is following precautions independently. Will initiate a dys 3 (mech soft) diet with thin liquids. Will f/u to interview pt  for tolerance, educated pt and husband about signs of aspiration. SLP Visit Diagnosis: Dysphagia, oropharyngeal phase (R13.12)    Aspiration Risk  Mild aspiration risk    Diet Recommendation Dysphagia 3 (Mech soft);Thin liquid    Liquid Administration via: Straw;Cup Medication  Administration: Whole meds with puree Supervision: Patient able to self feed Compensations: Slow rate;Small sips/bites Postural Changes: Seated upright at 90 degrees    Other  Recommendations Oral Care Recommendations: Oral care BID Caregiver Recommendations: Have oral suction available    Recommendations for follow up therapy are one component of a multi-disciplinary discharge planning process, led by the attending physician.  Recommendations may be updated based on patient status, additional functional criteria and insurance authorization.  Follow up Recommendations No SLP follow up      Assistance Recommended at Discharge    Functional Status Assessment Patient has had a recent decline in their functional status and demonstrates the ability to make significant improvements in function in a reasonable and predictable amount of time.  Frequency and Duration min 2x/week  1 week       Prognosis Prognosis for improved oropharyngeal function: Good      Swallow Study   General HPI: 82 year old female with past medical history of stroke, HLD, HTN, PAF on Pradaxa who presented to emergency department 01/24/23 morning as code stroke. Noted to have left-sided weakness, left facial droop, speech change. LKW 10:30PM 01/23/23. LVO+. Incidentally, had recent home COVID+ test on 01/15/23. 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 She was emergently taken to IR for mechanical thrombectomy. Was not given TNK. She was transferred to ICU post-IR intubated on mechanical ventilation. PCCM consulted for vent management and possible COVID pneumonia. intubated 10/1-10/2 Type of Study: Bedside Swallow Evaluation Previous Swallow Assessment: 06/28/20 Diet Prior to this Study: NPO Temperature Spikes Noted: No Respiratory Status: Room air History of Recent Intubation: Yes Total duration of intubation (days): 2 days Date extubated: 01/24/23 Behavior/Cognition:  Alert;Cooperative;Pleasant mood Oral Cavity Assessment: Within Functional Limits Oral Care Completed by SLP: No Oral Cavity - Dentition: Adequate natural dentition Vision: Functional for self-feeding Self-Feeding Abilities: Able to feed self Patient Positioning: Upright in chair Baseline Vocal Quality: Normal Volitional Cough: Strong Volitional Swallow: Able to elicit    Oral/Motor/Sensory Function Overall Oral Motor/Sensory Function: Within functional limits   Ice Chips Ice chips: Impaired Presentation: Spoon Pharyngeal Phase Impairments: Cough - Immediate   Thin Liquid Thin Liquid: Within functional limits Presentation: Cup;Straw;Self Fed    Nectar Thick Nectar Thick Liquid: Not tested   Honey Thick Honey Thick Liquid: Not tested   Puree Puree: Within functional limits   Solid     Solid: Impaired Presentation: Self Fed Oral Phase Functional Implications: Prolonged oral transit Pharyngeal Phase Impairments: Throat Clearing - Immediate      Makynna Manocchio, Riley Nearing 01/25/2023,11:43 AM

## 2023-01-26 DIAGNOSIS — R1310 Dysphagia, unspecified: Secondary | ICD-10-CM

## 2023-01-26 DIAGNOSIS — I482 Chronic atrial fibrillation, unspecified: Secondary | ICD-10-CM | POA: Diagnosis not present

## 2023-01-26 DIAGNOSIS — U071 COVID-19: Secondary | ICD-10-CM | POA: Diagnosis present

## 2023-01-26 DIAGNOSIS — I63511 Cerebral infarction due to unspecified occlusion or stenosis of right middle cerebral artery: Secondary | ICD-10-CM | POA: Diagnosis not present

## 2023-01-26 LAB — BASIC METABOLIC PANEL
Anion gap: 16 — ABNORMAL HIGH (ref 5–15)
BUN: 11 mg/dL (ref 8–23)
CO2: 22 mmol/L (ref 22–32)
Calcium: 8.3 mg/dL — ABNORMAL LOW (ref 8.9–10.3)
Chloride: 97 mmol/L — ABNORMAL LOW (ref 98–111)
Creatinine, Ser: 0.62 mg/dL (ref 0.44–1.00)
GFR, Estimated: >60 mL/min (ref >60)
Glucose, Bld: 126 mg/dL — ABNORMAL HIGH (ref 70–99)
Potassium: 3.3 mmol/L — ABNORMAL LOW (ref 3.5–5.1)
Sodium: 135 mmol/L (ref 135–145)

## 2023-01-26 LAB — CBC
HCT: 44.3 % (ref 36.0–46.0)
Hemoglobin: 15 g/dL (ref 12.0–15.0)
MCH: 31 pg (ref 26.0–34.0)
MCHC: 33.9 g/dL (ref 30.0–36.0)
MCV: 91.5 fL (ref 80.0–100.0)
Platelets: 201 10*3/uL (ref 150–400)
RBC: 4.84 MIL/uL (ref 3.87–5.11)
RDW: 12.8 % (ref 11.5–15.5)
WBC: 11.6 10*3/uL — ABNORMAL HIGH (ref 4.0–10.5)
nRBC: 0 % (ref 0.0–0.2)

## 2023-01-26 LAB — GLUCOSE, CAPILLARY
Glucose-Capillary: 119 mg/dL — ABNORMAL HIGH (ref 70–99)
Glucose-Capillary: 122 mg/dL — ABNORMAL HIGH (ref 70–99)
Glucose-Capillary: 131 mg/dL — ABNORMAL HIGH (ref 70–99)
Glucose-Capillary: 131 mg/dL — ABNORMAL HIGH (ref 70–99)
Glucose-Capillary: 139 mg/dL — ABNORMAL HIGH (ref 70–99)
Glucose-Capillary: 157 mg/dL — ABNORMAL HIGH (ref 70–99)

## 2023-01-26 MED ORDER — POTASSIUM CHLORIDE 20 MEQ PO PACK
40.0000 meq | PACK | ORAL | Status: AC
  Administered 2023-01-26 (×2): 40 meq via ORAL
  Filled 2023-01-26 (×2): qty 2

## 2023-01-26 MED ORDER — DABIGATRAN ETEXILATE MESYLATE 150 MG PO CAPS
150.0000 mg | ORAL_CAPSULE | Freq: Two times a day (BID) | ORAL | Status: DC
  Administered 2023-01-26 – 2023-01-30 (×9): 150 mg via ORAL
  Filled 2023-01-26 (×10): qty 1

## 2023-01-26 NOTE — Progress Notes (Signed)
Physical Therapy Treatment Patient Details Name: Mary Mercado MRN: [id number] DOB: [birth year] Today's Date: 01/26/2023   History of Present Illness Pt presented to the ED 10/1 with c/o L side weakness and slurred speech. She tested positive for covid 9/22. CTA revealed R M1 occlusion. She emergently underwent thrombectomy. PMH: hx of stroke x3, HLD, HTN, PAF, BPPV, s/p pacemaker    PT Comments  Pt greeted seated up EOB, requesting bathroom assistance and agreeable to session. Pt requiring grossly min A to come to stand from EOB and low commode with pt demonstrating good hand placement with each rise. Pt able to progress gait distance with min A to steady and RW for support with cues for increased foot clearance as pt with tendency for short shuffling steps.  Educated pt on importance of time up OOB and ambulating to<>from bathroom with staff between therapies with pt verbalizing understanding. Current plan remains appropriate to address deficits and maximize functional independence and decrease caregiver burden. Pt continues to benefit from skilled PT services to progress toward functional mobility goals.      If plan is discharge home, recommend the following: A little help with walking and/or transfers;A little help with bathing/dressing/bathroom;Assistance with cooking/housework;Assist for transportation   Can travel by private vehicle        Equipment Recommendations  None recommended by PT    Recommendations for Other Services       Precautions / Restrictions Precautions Precautions: Fall;Other (comment) Precaution Comments: covid Restrictions Weight Bearing Restrictions: Yes     Mobility  Bed Mobility Overal bed mobility: Needs Assistance Bed Mobility: Sit to Supine       Sit to supine: Contact guard assist   General bed mobility comments: no physical assist, increased time    Transfers Overall transfer level: Needs assistance Equipment used: Rolling  walker (2 wheels) Transfers: Sit to/from Stand, Bed to chair/wheelchair/BSC Sit to Stand: Min assist           General transfer comment: light min A to rise from EOB and low commode    Ambulation/Gait Ambulation/Gait assistance: Min assist Gait Distance (Feet): 15 Feet (+ 70') Assistive device: Rolling walker (2 wheels) Gait Pattern/deviations: Step-through pattern, Decreased stride length Gait velocity: decreased     General Gait Details: short gait to bathroom with pt agreeable to longer trial after BM, short shuffling steps with poor ability to correct, cues for upright posture   Stairs             Wheelchair Mobility     Tilt Bed    Modified Rankin (Stroke Patients Only) Modified Rankin (Stroke Patients Only) Pre-Morbid Rankin Score: Slight disability Modified Rankin: Moderately severe disability     Balance Overall balance assessment: Needs assistance Sitting-balance support: Feet supported, Single extremity supported Sitting balance-Leahy Scale: Good Sitting balance - Comments: no LOB donning socks at edge of chair   Standing balance support: Single extremity supported Standing balance-Leahy Scale: Poor Standing balance comment: reliant on UE support                            Cognition Arousal: Alert Behavior During Therapy: WFL for tasks assessed/performed Overall Cognitive Status: Within Functional Limits for tasks assessed                                          Exercises  General Comments General comments (skin integrity, edema, etc.): VSS on RA      Pertinent Vitals/Pain Pain Assessment Pain Assessment: No/denies pain    Home Living                          Prior Function            PT Goals (current goals can now be found in the care plan section) Acute Rehab PT Goals Patient Stated Goal: rehab then home PT Goal Formulation: With patient Time For Goal Achievement:  02/08/23 Progress towards PT goals: Progressing toward goals    Frequency    Min 1X/week      PT Plan      Co-evaluation              AM-PAC PT "6 Clicks" Mobility   Outcome Measure  Help needed turning from your back to your side while in a flat bed without using bedrails?: A Little Help needed moving from lying on your back to sitting on the side of a flat bed without using bedrails?: A Little Help needed moving to and from a bed to a chair (including a wheelchair)?: A Little Help needed standing up from a chair using your arms (e.g., wheelchair or bedside chair)?: A Little Help needed to walk in hospital room?: A Little Help needed climbing 3-5 steps with a railing? : Total 6 Click Score: 16    End of Session   Activity Tolerance: Patient tolerated treatment well Patient left: with call bell/phone within reach;in bed;with bed alarm set Nurse Communication: Mobility status PT Visit Diagnosis: Other abnormalities of gait and mobility (R26.89)     Time: 1610-9604 PT Time Calculation (min) (ACUTE ONLY): 23 min  Charges:    $Gait Training: 8-22 mins $Therapeutic Activity: 8-22 mins PT General Charges $$ ACUTE PT VISIT: 1 Visit                     Tobi Bastos R. PTA Acute Rehabilitation Services Office: [phone]   Catalina Antigua 01/26/2023, 1:55 PM

## 2023-01-26 NOTE — Progress Notes (Signed)
Speech Language Pathology Treatment: Dysphagia  Patient Details Name: Mary Mercado MRN: [id number] DOB: February 12, 1941 Today's Date: 01/26/2023 Time: 4098-1191 SLP Time Calculation (min) (ACUTE ONLY): 15 min  Assessment / Plan / Recommendation Clinical Impression  Pt is tolerarting mechanical soft diet with mild dysphagia (audible swallow. Pt continues to follow her typical precautions. She sat edge of bed for her meal today because no chair was available, so SLP found a recliner for her. Continue basic precautions. F/u with SLP at next level of care for dysarthria.    HPI HPI: 82 year old female with past medical history of stroke, HLD, HTN, PAF on Pradaxa who presented to emergency department 01/24/23 morning as code stroke. Noted to have left-sided weakness, left facial droop, speech change. LKW 10:30PM 01/23/23. LVO+. Incidentally, had recent home COVID+ test on 01/15/23. 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 She was emergently taken to IR for mechanical thrombectomy. Was not given TNK. She was transferred to ICU post-IR intubated on mechanical ventilation. PCCM consulted for vent management and possible COVID pneumonia. intubated 10/1-10/2      SLP Plan  All goals met      Recommendations for follow up therapy are one component of a multi-disciplinary discharge planning process, led by the attending physician.  Recommendations may be updated based on patient status, additional functional criteria and insurance authorization.    Recommendations  Diet recommendations: Dysphagia 3 (mechanical soft);Thin liquid Liquids provided via: Cup;Straw Medication Administration: Whole meds with puree Supervision: Patient able to self feed Postural Changes and/or Swallow Maneuvers: Out of bed for meals                        Dysarthria and anarthria (R47.1)     All goals met     Mary Mercado, Riley Nearing  01/26/2023, 2:27  PM

## 2023-01-26 NOTE — PMR Pre-admission (Signed)
PMR Admission Coordinator Pre-Admission Assessment  Patient: Mary Mercado is an 82 y.o., female MRN: [id number] DOB: 1941-02-20 Height: 5\' 5"  (165.1 cm) Weight: 47 kg  Insurance Information HMO:     PPO:      PCP:      IPA:      80/20:      OTHER:  PRIMARY: Medicare a and b      Policy#: [id number]      Subscriber: pt Benefits:  Phone #: passport one source     Name: 10/3 Eff. Date: 01/23/2005     Deduct: $1632      Out of Pocket Max: none      Life Max: none CIR: 100%      SNF: 20 full days Outpatient: 80%     Co-Pay: 20% Home Health: 100%      Co-Pay: none DME: 80%     Co-Pay: 20% Providers: in network  SECONDARY:   BCBS [place] supplement    Policy#: [id number] 10/4 I requested policy to be added to acute chart as well as CIR chart in admitting  Financial Counselor:        The "Data Collection Information Summary" for patients in Inpatient Rehabilitation Facilities with attached "Privacy Act Statement-Health Care Records" was provided and verbally reviewed with: Patient and Family  Emergency Contact Information Contact Information     Name Relation Home Work Mobile   Guilfoil,[name] Spouse [phone]  [phone]      Other Contacts   None on File    Current Medical History  Patient Admitting Diagnosis: CVA  History of Present Illness: 82 year old right-handed female with history of hyperlipidemia, hypertension, 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   Presented 01/24/2023 with acute onset of left-sided weakness and slurred speech.  Cranial CT scan negative for acute findings.  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  Underwent emergent thrombectomy 01/24/2023 per interventional radiology.  Echocardiogram with  ejection fraction of 60 to 65% no wall motion abnormalities.  Follow-up CT showed beam hardening artifact at the level of the mid/anterior right temporal lobe no intracranial hemorrhage.  In regards to patient recent COVID infection diagnosed 9/25/ 2024 completed Paxlovid course.  Follow-up testing 10/1 positive patient asymptomatic currently on airborne contact precautions..  She is currently maintained on Pradaxa for CVA prophylaxis.     Complete NIHSS TOTAL: 4  Patient's medical record from [facility] has been reviewed by the rehabilitation admission coordinator and physician.  Past Medical History  Past Medical History:  Diagnosis Date   Allergy    CVA (cerebral vascular accident) (HCC) 05/2018, 01/09/20   Hyperlipidemia LDL goal <70    Hypertension    Pacemaker    Palpitations    Vertigo, benign positional    Has the patient had major surgery during 100 days prior to admission? Yes  Family History   family history includes Colon cancer in her father; Diabetes in her mother; Heart attack in her mother; Heart attack (age of onset: 27) in her brother.  Current Medications  Current Facility-Administered Medications:    0.9 %  sodium chloride infusion, 250 mL, Intravenous, Continuous, Autry, Lauren E, PA-C, Stopped at 01/24/23 1209   atorvastatin (LIPITOR) tablet 80 mg, 80 mg, Oral, Daily, Pham, Minh Q, RPH-CPP, 80 mg  at 01/29/23 0918   Chlorhexidine Gluconate Cloth 2 % PADS 6 each, 6 each, Topical, Daily, Marvel Plan, MD, 6 each at 01/29/23 0918   dabigatran (PRADAXA) capsule 150 mg, 150 mg, Oral, Q12H, Carrington Clamp, RPH, 150 mg at 01/29/23 2137   docusate sodium (COLACE) capsule 100 mg, 100 mg, Oral, BID PRN, Pham, Minh Q, RPH-CPP   dorzolamide-timolol (COSOPT) 2-0.5 % ophthalmic solution 1 drop, 1 drop, Both Eyes, BID, Autry, Lauren E, PA-C, 1 drop at 01/29/23 2137   famotidine (PEPCID) tablet 20 mg, 20 mg, Oral, Daily, Pham, Minh Q, RPH-CPP, 20 mg at 01/29/23 0918    insulin aspart (novoLOG) injection 0-6 Units, 0-6 Units, Subcutaneous, TID WC, Marvel Plan, MD, 1 Units at 01/28/23 1230   latanoprost (XALATAN) 0.005 % ophthalmic solution 1 drop, 1 drop, Both Eyes, QHS, Autry, Lauren E, PA-C, 1 drop at 01/29/23 2137   levETIRAcetam (KEPPRA) tablet 500 mg, 500 mg, Oral, BID, Pham, Minh Q, RPH-CPP, 500 mg at 01/29/23 2137   Oral care mouth rinse, 15 mL, Mouth Rinse, 4 times per day, Palikh, Jeri Lager, MD   Oral care mouth rinse, 15 mL, Mouth Rinse, PRN, Palikh, Jeri Lager, MD   polyethylene glycol (MIRALAX / GLYCOLAX) packet 17 g, 17 g, Per Tube, Daily PRN, Pia Mau D, PA-C   senna-docusate (Senokot-S) tablet 1 tablet, 1 tablet, Oral, QHS PRN, Pia Mau D, PA-C  Facility-Administered Medications Ordered in Other Encounters:    regadenoson (LEXISCAN) injection SOLN 0.4 mg, 0.4 mg, Intravenous, Once, Jodelle Red, MD   technetium tetrofosmin (TC-MYOVIEW) injection 32 millicurie, 32 millicurie, Intravenous, Once PRN, Jodelle Red, MD  Patients Current Diet:  Diet Order             DIET DYS 3 Room service appropriate? Yes with Assist; Fluid consistency: Thin  Diet effective now                  Precautions / Restrictions Precautions Precautions: Fall, Other (comment) Precaution Comments: covid Restrictions Weight Bearing Restrictions: No   Has the patient had 2 or more falls or a fall with injury in the past year? No  Prior Activity Level Community (5-7x/wk): mod I with rollator  Prior Functional Level Self Care: Did the patient need help bathing, dressing, using the toilet or eating? Independent  Indoor Mobility: Did the patient need assistance with walking from room to room (with or without device)? Independent  Stairs: Did the patient need assistance with internal or external stairs (with or without device)? Independent  Functional Cognition: Did the patient need help planning regular tasks such as shopping or  remembering to take medications? Independent  Patient Information Are you of Hispanic, Latino/a,or Spanish origin?: A. No, not of Hispanic, Latino/a, or Spanish origin What is your race?: A. White Do you need or want an interpreter to communicate with a doctor or health care staff?: 0. No  Patient's Response To:  Health Literacy and Transportation Is the patient able to respond to health literacy and transportation needs?: Yes Health Literacy - How often do you need to have someone help you when you read instructions, pamphlets, or other written material from your doctor or pharmacy?: Never In the past 12 months, has lack of transportation kept you from medical appointments or from getting medications?: No In the past 12 months, has lack of transportation kept you from meetings, work, or from getting things needed for daily living?: No  Home Assistive Devices / Equipment Home Equipment: Rollator (4 wheels),  Shower seat - built in, [employer] - tub/shower, Grab bars - toilet, Hand held shower head  Prior Device Use: Indicate devices/aids used by the patient prior to current illness, exacerbation or injury? rollator  Current Functional Level Cognition  Overall Cognitive Status: Impaired/Different from baseline Orientation Level: Oriented X4 General Comments: likely baseline    Extremity Assessment (includes Sensation/Coordination)  Upper Extremity Assessment: Right hand dominant, LUE deficits/detail LUE Deficits / Details: 4/5 strength, had residual fine motor deficits from previous strokes LUE Sensation: decreased proprioception LUE Coordination: decreased fine motor  Lower Extremity Assessment: Defer to PT evaluation    ADLs  Overall ADL's : Needs assistance/impaired Eating/Feeding: Set up, Sitting Eating/Feeding Details (indicate cue type and reason): using L UE to hold applesauce cup Grooming: Wash/dry hands, Wash/dry face, Brushing hair, Oral care, Sitting, Set up Upper Body  Bathing: Minimal assistance, Sitting Upper Body Bathing Details (indicate cue type and reason): washed pt's back Lower Body Bathing: Contact guard assist, Sit to/from stand Upper Body Dressing : Set up, Sitting Lower Body Dressing: Contact guard assist, Sit to/from stand Lower Body Dressing Details (indicate cue type and reason): can don and doff socks Toilet Transfer: Contact guard assist, Ambulation, Regular Toilet, Grab bars, Rolling walker (2 wheels) Toileting- Clothing Manipulation and Hygiene: Minimal assistance, Sit to/from stand Toileting - Clothing Manipulation Details (indicate cue type and reason): assist for toilet paper out of dispenser Functional mobility during ADLs: Contact guard assist, Rolling walker (2 wheels)    Mobility  Overal bed mobility: Needs Assistance Bed Mobility: Supine to Sit Supine to sit: Supervision, Used rails, HOB elevated Sit to supine: Contact guard assist General bed mobility comments: Extra time, no physical assist but provided cues for technique with low HOB setting.    Transfers  Overall transfer level: Needs assistance Equipment used: Rolling walker (2 wheels) Transfers: Sit to/from Stand Sit to Stand: Contact guard assist Bed to/from chair/wheelchair/BSC transfer type:: Step pivot Step pivot transfers: Min assist General transfer comment: CGA for safety and balance from bed and toilet today. Slow to rise, cues for hand placement. Minor posterior lean noted.    Ambulation / Gait / Stairs / Wheelchair Mobility  Ambulation/Gait Ambulation/Gait assistance: Editor, commissioning (Feet): 75 Feet Assistive device: Rolling walker (2 wheels) Gait Pattern/deviations: Step-through pattern, Decreased stride length, Shuffle General Gait Details: Good posture but slow and intermittently shuffling steps, especially in congested areas. min assist for RW control rarely, mostly at [facility] level with minor instability despite RW support. Cues for awareness,  technique, and walker proximity with turns. Gait velocity: decreased Gait velocity interpretation: <1.31 ft/sec, indicative of household ambulator    Posture / Balance Dynamic Sitting Balance Sitting balance - Comments: sits EOB and toilet without UE support supervision level Balance Overall balance assessment: Needs assistance Sitting-balance support: Feet supported, No upper extremity supported Sitting balance-Leahy Scale: Good Sitting balance - Comments: sits EOB and toilet without UE support supervision level Standing balance support: No upper extremity supported Standing balance-Leahy Scale: Fair Standing balance comment: reliant on UE support for ambulation, fair in static standing at sink    Special needs/care consideration Hb A1c 6.6 COVID + dx 01/18/23, completed Paxlovid course; 10/1 retest + COVID isolation to be discontinued on 10/5   Previous Home Environment  Living Arrangements: Spouse/significant other  Lives With: Spouse Available Help at Discharge: Family, Available 24 hours/day Type of Home: Independent living facility Home Layout: One level Home Access: Engineer, maintenance (IT) Shower/Tub: Heritage manager Toilet: Handicapped height Bathroom Accessibility:  Yes Home Care Services: No Additional Comments: Pt lives at [facility] area)  Discharge Living Setting Plans for Discharge Living Setting: Patient's home, Other (Comment) (Independent living at [facility]) Type of Home at Discharge: Independent living facility Care Facility Name at Discharge: [facility] Discharge Home Layout: One level Discharge Home Access: Elevator Discharge Bathroom Shower/Tub: Walk-in shower Discharge Bathroom Toilet: Handicapped height Discharge Bathroom Accessibility: Yes How Accessible: Accessible via walker Does the patient have any problems obtaining your medications?: No  Social/Family/Support Systems Patient Roles: Spouse Contact Information:  spouse, Interior and spatial designer Anticipated Caregiver: Spouse Anticipated Caregiver's Contact Information: see contacts Ability/Limitations of Caregiver: no limitations Caregiver Availability: 24/7 Discharge Plan Discussed with Primary Caregiver: Yes Is Caregiver In Agreement with Plan?: Yes Does Caregiver/Family have Issues with Lodging/Transportation while Pt is in Rehab?: No  Goals Patient/Family Goal for Rehab: supervision with PT and OT Expected length of stay: ELOS 10 to 14 days Pt/Family Agrees to Admission and willing to participate: Yes Program Orientation Provided & Reviewed with Pt/Caregiver Including Roles  & Responsibilities: Yes  Decrease burden of Care through IP rehab admission: n/a  Possible need for SNF placement upon discharge: not anticipated, but has access to SNF at [facility] if needed  Patient Condition: I have reviewed medical records from [facility], spoken with patient and spouse. I met with patient at the bedside for inpatient rehabilitation assessment.  Patient will benefit from ongoing PT, OT, and SLP, can actively participate in 3 hours of therapy a day 5 days of the week, and can make measurable gains during the admission.  Patient will also benefit from the coordinated team approach during an Inpatient Acute Rehabilitation admission.  The patient will receive intensive therapy as well as Rehabilitation physician, nursing, social worker, and care management interventions.  Due to bladder management, bowel management, safety, skin/wound care, disease management, medication administration, pain management, and patient education the patient requires 24 hour a day rehabilitation nursing.  The patient is currently min assist overall with mobility and basic ADLs.  Discharge setting and therapy post discharge at home with home health is anticipated.  Patient has agreed to participate in the Acute Inpatient Rehabilitation Program and will admit 01/30/23 when bed is  available.  Preadmission Screen Completed By:  Clois Dupes RN MSN 01/30/2023 9:53 AM ______________________________________________________________________   Discussed status with Dr. Natale Lay on 01/30/23 at 9:53 AM and received approval for admission Monday 10/7 when bed is available.  Admission Coordinator:  Clois Dupes, RN MSN time 9:53 Date 01/30/23   Assessment/Plan: Diagnosis: R MCA CVA Does the need for close, 24 hr/day Medical supervision in concert with the patient's rehab needs make it unreasonable for this patient to be served in a less intensive setting? Yes Co-Morbidities requiring supervision/potential complications: Hx of seizure/CVA, Afib, HLD, HTN, Dsyphagia, BPPV, hypokalemia,  Due to bladder management, bowel management, safety, skin/wound care, disease management, medication administration, pain management, and patient education, does the patient require 24 hr/day rehab nursing? Yes Does the patient require coordinated care of a physician, rehab nurse, PT, OT, and SLP to address physical and functional deficits in the context of the above medical diagnosis(es)? Yes Addressing deficits in the following areas: balance, endurance, locomotion, strength, transferring, bowel/bladder control, bathing, dressing, feeding, grooming, toileting, cognition, speech, language, swallowing, and psychosocial support Can the patient actively participate in an intensive therapy program of at least 3 hrs of therapy 5 days a week? Yes The potential for patient to make measurable gains while  on inpatient rehab is excellent Anticipated functional outcomes upon discharge from inpatient rehab: supervision PT, supervision OT, supervision SLP Estimated rehab length of stay to reach the above functional goals is: 10-14 Anticipated discharge destination: Home 10. Overall Rehab/Functional Prognosis: excellent   MD Signature: Fanny Dance

## 2023-01-26 NOTE — Progress Notes (Signed)
STROKE TEAM PROGRESS NOTE  BRIEF HPI [name] is a 82 y.o. female with hx of stroke x3, HLD, HTN, PAF on Pradaxa who presented to emergency department 01/24/23 morning as code stroke. Left sided weakness, facial droop, and speech change were noted at admission. LKW: 2230, 01/23/23. LVO+. COVID + 9/22. Not given TNK.  Right M1 occlusion with large penumbra. Transferred to ICU, post IR. Now medically stable to advance to a progressive care floor.   SIGNIFICANT HOSPITAL EVENTS - 01/24/23. Admitted via ED, IR intervention. Extubated. - 01/25/23. Pt leaving ICU for progressive floor. - 01/26/23 starting pradaxa   INTERIM HISTORY/SUBJECTIVE Husband is at the bedside. Pt sitting at the edge of bed for lunch. She is AAO x 3, denies any pain or discomfort. Moving all extremities. Pending CIR.    OBJECTIVE  CBC    Component Value Date/Time   WBC 11.6 (H) 01/26/2023 0717   RBC 4.84 01/26/2023 0717   HGB 15.0 01/26/2023 0717   HGB 13.9 01/28/2021 1020   HCT 44.3 01/26/2023 0717   HCT 43.4 01/28/2021 1020   PLT 201 01/26/2023 0717   PLT 254 01/28/2021 1020   MCV 91.5 01/26/2023 0717   MCV 90 01/28/2021 1020   MCH 31.0 01/26/2023 0717   MCHC 33.9 01/26/2023 0717   RDW 12.8 01/26/2023 0717   RDW 12.5 01/28/2021 1020   LYMPHSABS 0.9 01/24/2023 2218   MONOABS 0.7 01/24/2023 2218   EOSABS 0.0 01/24/2023 2218   BASOSABS 0.0 01/24/2023 2218    BMET    Component Value Date/Time   NA 135 01/26/2023 0717   NA 143 09/21/2021 1600   K 3.3 (L) 01/26/2023 0717   CL 97 (L) 01/26/2023 0717   CO2 22 01/26/2023 0717   GLUCOSE 126 (H) 01/26/2023 0717   BUN 11 01/26/2023 0717   BUN 14 09/21/2021 1600   CREATININE 0.62 01/26/2023 0717   CREATININE 0.66 11/14/2019 1102   CALCIUM 8.3 (L) 01/26/2023 0717   EGFR 66 09/21/2021 1600   GFRNONAA >60 01/26/2023 0717    IMAGING past 24 hours No results found.  Vitals:   01/25/23 2321 01/26/23 0339 01/26/23 0700 01/26/23 1100  BP: (!)  143/81 (!) 140/77 134/77 139/78  Pulse: 70 69 70 71  Resp: 18 18 19 19   Temp: 97.8 F (36.6 C) 97.9 F (36.6 C) 97.8 F (36.6 C) 98 F (36.7 C)  TempSrc: Axillary Axillary Oral Oral  SpO2: 95% 97% 96% 97%  Weight:      Height:         PHYSICAL EXAM General:  Awake and alert patient. Appears frail, thin. Psych:  Mood: "okay, considering where I am." Affect: congruent.   CV: Regular rate and rhythm on monitor. Edema of BLLE.  Respiratory: Breathing comfortably on room air at time of exam.  GI: Abdomen soft, non-tender.  NEURO:  Mental Status: Alert and Oriented to self, place, date, situation.  Speech/Language: Patient's voice was mildly hoarse and soft. She has some lingering discomfort from extubation.    Cranial Nerves:  II: PERRL. Visual fields full to hand-waving. 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 III, IV, VI: EOMI. Eyelids elevate symmetrically.  V: Sensation is intact to light touch and symmetrical to face.  VII: Face is symmetrical resting and smiling VIII: hearing intact to voice. IX, X: Palate elevates symmetrically. Phonation is hoarse and soft. SA:[id number] shrug 5/5. XII: tongue is midline without fasciculations. Motor: 5/5 strength to all muscle groups tested.  Tone: is normal and bulk is normal (for thin, elderly patient) Sensation- Intact to light touch bilaterally. Extinction absent to light touch to DSS.   Coordination: FTN intact bilaterally, HKS: no ataxia in BLE.No drift.  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 Will place transfer orders to move her to the progressive neuro floor (3 Chad). Code Stroke 10/1 CT head - No acute finding. 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   CTA head & neck: Emergent large  vessel occlusion at the right M1 segment. Widespread atherosclerosis. High-grade atheromatous stenosis at the non dominant left vertebral origin. CTP 20 cc infarct core with extensive penumbra  S/p IR with complete resolution of M1/MCA blockage achieving complete recanalization (TICI3). MRA  Not eligible due to pacemaker lead being in atrium per vendor ([employer]) CT Head repeat Beam hardening artifact at the level of the mid/anterior right temporal lobe. Within this limitation, appreciable acute infarct. No intracranial hemorrhage. 2D Echo: EF 60 to 65% LDL 61 HgbA1c 6.6 VTE prophylaxis - Lovenox 30 Q24 Pradaxa (dabigatran) once a day prior to admission, resume home Pradaxa today Continue home keppra 500 bid Therapy recommendations: CIR Disposition: Pending  History of stroke/seizure 05/2018 admitted for left-sided numbness weakness, status post TPA, MRI showed right frontal parietal infarct.  CTA head neck showed right P2, left VA, bilateral ICA siphon severe stenosis.  EF 60 to 65%.  Negative for DVT, TEE negative.  LDL 117, A1c 5.9.  Discharged with DAPT and Lipitor 40 30-day CardioNet monitoring showed A. fib patient started on Eliquis. 12/2019  Admitted for right cerebellar infarct.  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  EF 60 to 65%, LDL 81, A1c 5.9.  Eliquis changed to Pradaxa, continue Lipitor 80. 06/2020 admitted for focal seizure started on keppra. MRI negative for stroke. LDL 71 and A1C 6.4. continued on pradaxa 150 bid and keppra.  Followed with [name] NP at [facility]  Atrial fibrillation, chronic  Home Meds: lopressor and pradaxa Taking once a day with pradaxa during the last 5-6 days with COVID and paxlovid Rate controlled Resume pradaxa today  Respiratory failure, post procedure COVID infection Recent COVID infection diagnosed on 9/25 Completed paxlovid course.  Pt still COVID+ on 10/1 re-test.   Asymptomatic  now  Hypertension Home meds:  norvasc, cozaar Stable BP goal less than 180/105  long-term BP goal normotensive  Hyperlipidemia Home meds: atorvastatin 80 LDL 61, goal < 70 Resumed home liptior 80 Continue statin at discharge  Dysphagia Speech on board On dysphagia 3 and seen liquid Avoid aspiration  Other Stroke Risk Factors Advanced age  Other Active Problems BPPV Hypokalemia - K 3.3, supplement  Hospital day # 2   Marvel Plan, MD PhD Stroke Neurology 01/26/2023 12:19 PM      To contact Stroke Continuity provider, please refer to [email or web address]. After hours, contact General Neurology

## 2023-01-26 NOTE — Discharge Summary (Addendum)
Stroke Discharge Summary  Patient ID: Mary Mercado   MRN: [id number]      DOB: Jan 22, 1941  Date of Admission: 01/24/2023 Date of Discharge: 01/30/2023  Attending Physician:  Stroke, Md, MD, Stroke MD Patient's PCP:  Nelwyn Salisbury, MD  Discharge Diagnoses: Acute Ischemic Infarct:  right MCA infarct with R M1 occlusion s/p IR with TICI3, etiology could be taking pradaxa once a day with COVID infection and paxlovid use  Principal Problem:   Acute ischemic right MCA stroke [facility]) Active Problems:   Atrial fibrillation (HCC)   Acute respiratory failure (HCC)   COVID   Dysphagia   Medications to be continued on Rehab Allergies as of 01/30/2023       Reactions   Duloxetine    Causes Bp elevation.  Increased body jerks, nausea   Acetaminophen Hives, Rash        Medication List     STOP taking these medications    losartan 50 MG tablet Commonly known as: COZAAR   metoprolol tartrate 25 MG tablet Commonly known as: LOPRESSOR   nirmatrelvir/ritonavir 20 x 150 MG & 10 x 100MG  Tabs Commonly known as: PAXLOVID   Tafluprost (PF) 0.0015 % Soln Replaced by: latanoprost 0.005 % ophthalmic solution       TAKE these medications    amLODipine 2.5 MG tablet Commonly known as: NORVASC TAKE 1 TABLET BY MOUTH EVERY DAY   atorvastatin 80 MG tablet Commonly known as: LIPITOR Take 1 tablet (80 mg total) by mouth daily. What changed: when to take this   dabigatran 150 MG Caps capsule Commonly known as: PRADAXA Take 1 capsule (150 mg total) by mouth every 12 (twelve) hours. What changed: when to take this   dorzolamide-timolol 2-0.5 % ophthalmic solution Commonly known as: COSOPT Place 1 drop into both eyes 2 (two) times daily.   fluorouracil 5 % cream Commonly known as: EFUDEX Apply 1 Application topically 2 (two) times daily.   latanoprost 0.005 % ophthalmic solution Commonly known as: XALATAN Place 1 drop into both eyes at bedtime. Replaces: Tafluprost  (PF) 0.0015 % Soln   levETIRAcetam 500 MG tablet Commonly known as: Keppra Take 1 tablet (500 mg total) by mouth 2 (two) times daily.        LABORATORY STUDIES CBC    Component Value Date/Time   WBC 7.6 01/29/2023 1156   RBC 4.75 01/29/2023 1156   HGB 14.4 01/29/2023 1156   HGB 13.9 01/28/2021 1020   HCT 43.6 01/29/2023 1156   HCT 43.4 01/28/2021 1020   PLT 242 01/29/2023 1156   PLT 254 01/28/2021 1020   MCV 91.8 01/29/2023 1156   MCV 90 01/28/2021 1020   MCH 30.3 01/29/2023 1156   MCHC 33.0 01/29/2023 1156   RDW 12.9 01/29/2023 1156   RDW 12.5 01/28/2021 1020   LYMPHSABS 0.9 01/24/2023 2218   MONOABS 0.7 01/24/2023 2218   EOSABS 0.0 01/24/2023 2218   BASOSABS 0.0 01/24/2023 2218   CMP    Component Value Date/Time   NA 137 01/29/2023 1156   NA 143 09/21/2021 1600   K 4.3 01/29/2023 1156   CL 100 01/29/2023 1156   CO2 24 01/29/2023 1156   GLUCOSE 110 (H) 01/29/2023 1156   BUN 12 01/29/2023 1156   BUN 14 09/21/2021 1600   CREATININE 0.48 01/29/2023 1156   CREATININE 0.66 11/14/2019 1102   CALCIUM 8.9 01/29/2023 1156   PROT 6.5 01/24/2023 2218   ALBUMIN 3.2 (L) 01/24/2023 2218  AST 29 01/24/2023 2218   ALT 21 01/24/2023 2218   ALKPHOS 80 01/24/2023 2218   BILITOT 1.1 01/24/2023 2218   GFRNONAA >60 01/29/2023 1156   GFRAA 86 06/10/2020 1151   COAGS Lab Results  Component Value Date   INR 1.1 01/24/2023   INR 1.6 (H) 06/28/2020   INR 1.3 (H) 01/09/2020   Lipid Panel    Component Value Date/Time   CHOL 153 01/25/2023 0818   CHOL 161 09/21/2021 1600   TRIG 125 01/25/2023 0819   HDL 51 01/25/2023 0818   HDL 84 09/21/2021 1600   CHOLHDL 3.0 01/25/2023 0818   VLDL 18 01/25/2023 0818   LDLCALC 84 01/25/2023 0818   LDLCALC 67 09/21/2021 1600   LDLCALC 90 11/14/2019 1102   HgbA1C  Lab Results  Component Value Date   HGBA1C 6.6 (H) 01/25/2023   Urinalysis    Component Value Date/Time   COLORURINE STRAW (A) 01/09/2020 2210   APPEARANCEUR CLEAR  01/09/2020 2210   LABSPEC 1.027 01/09/2020 2210   PHURINE 6.0 01/09/2020 2210   GLUCOSEU >=500 (A) 01/09/2020 2210   HGBUR NEGATIVE 01/09/2020 2210   BILIRUBINUR NEG 01/13/2022 0921   KETONESUR 20 (A) 01/09/2020 2210   PROTEINUR Positive (A) 01/13/2022 0921   PROTEINUR NEGATIVE 01/09/2020 2210   UROBILINOGEN 0.2 01/13/2022 0921   UROBILINOGEN 0.2 02/01/2011 1044   NITRITE NEG 01/13/2022 0921   NITRITE NEGATIVE 01/09/2020 2210   LEUKOCYTESUR Negative 01/13/2022 0921   LEUKOCYTESUR NEGATIVE 01/09/2020 2210   Urine Drug Screen     Component Value Date/Time   LABOPIA NONE DETECTED 01/09/2020 2210   COCAINSCRNUR NONE DETECTED 01/09/2020 2210   LABBENZ NONE DETECTED 01/09/2020 2210   AMPHETMU NONE DETECTED 01/09/2020 2210   THCU NONE DETECTED 01/09/2020 2210   LABBARB NONE DETECTED 01/09/2020 2210    Alcohol Level    Component Value Date/Time   ETH <10 01/24/2023 2218     SIGNIFICANT DIAGNOSTIC STUDIES ECHOCARDIOGRAM COMPLETE  Result Date: 01/25/2023    ECHOCARDIOGRAM REPORT   Patient Name:   Mary Mercado Date of Exam: 01/25/2023 Medical Rec #:  [id number]                 Height:       65.0 in Accession #:    [id number]                Weight:       103.6 lb Date of Birth:  Apr 15, 1941                 BSA:          1.496 m Patient Age:    82 years                  BP:           133/68 mmHg Patient Gender: F                         HR:           70 bpm. Exam Location:  Inpatient Procedure: 2D Echo, Color Doppler and Cardiac Doppler Indications:    Stroke I63.9  History:        Patient has prior history of Echocardiogram examinations, most                 recent 06/29/2020. Risk Factors:Hypertension and Dyslipidemia.  Sonographer:    Harriette Bouillon RDCS Referring Phys: [id number] ASHISH ARORA IMPRESSIONS  1. Left ventricular ejection fraction, by estimation, is 60 to 65%. The left ventricle has normal function. The left ventricle has no regional wall motion abnormalities. Left  ventricular diastolic function could not be evaluated.  2. Right ventricular systolic function is hyperdynamic. The right ventricular size is mildly enlarged. There is moderately elevated pulmonary artery systolic pressure. The estimated right ventricular systolic pressure is 48.4 mmHg.  3. Left atrial size was severely dilated.  4. Right atrial size was severely dilated.  5. A small pericardial effusion is present. The pericardial effusion is circumferential.  6. The mitral valve is normal in structure. Trivial mitral valve regurgitation. No evidence of mitral stenosis.  7. Tricuspid valve regurgitation is severe.  8. The aortic valve is tricuspid. There is mild calcification of the aortic valve. There is mild thickening of the aortic valve. Aortic valve regurgitation is mild. Aortic valve sclerosis/calcification is present, without any evidence of aortic stenosis.  9. The inferior vena cava is dilated in size with <50% respiratory variability, suggesting right atrial pressure of 15 mmHg. Comparison(s): No significant change from prior study. Prior images reviewed side by side. FINDINGS  Left Ventricle: Left ventricular ejection fraction, by estimation, is 60 to 65%. The left ventricle has normal function. The left ventricle has no regional wall motion abnormalities. The left ventricular internal cavity size was normal in size. There is  no left ventricular hypertrophy. Left ventricular diastolic function could not be evaluated due to atrial fibrillation. Left ventricular diastolic function could not be evaluated. Right Ventricle: The right ventricular size is mildly enlarged. No increase in right ventricular wall thickness. Right ventricular systolic function is hyperdynamic. There is moderately elevated pulmonary artery systolic pressure. The tricuspid regurgitant velocity is 2.89 m/s, and with an assumed right atrial pressure of 15 mmHg, the estimated right ventricular systolic pressure is 48.4 mmHg. Left  Atrium: Left atrial size was severely dilated. Right Atrium: Right atrial size was severely dilated. Pericardium: A small pericardial effusion is present. The pericardial effusion is circumferential. Mitral Valve: The mitral valve is normal in structure. Mild to moderate mitral annular calcification. Trivial mitral valve regurgitation. No evidence of mitral valve stenosis. Tricuspid Valve: The tricuspid valve is normal in structure. Tricuspid valve regurgitation is severe. Aortic Valve: The noncoronary cusp is severely calcified and has severely restricted motion. The aortic valve is tricuspid. There is mild calcification of the aortic valve. There is mild thickening of the aortic valve. Aortic valve regurgitation is mild.  Aortic regurgitation PHT measures 715 msec. Aortic valve sclerosis/calcification is present, without any evidence of aortic stenosis. Pulmonic Valve: The pulmonic valve was normal in structure. Pulmonic valve regurgitation is not visualized. No evidence of pulmonic stenosis. Aorta: The aortic root and ascending aorta are structurally normal, with no evidence of dilitation. Venous: The inferior vena cava is dilated in size with less than 50% respiratory variability, suggesting right atrial pressure of 15 mmHg. IAS/Shunts: No atrial level shunt detected by color flow Doppler. Additional Comments: A device lead is visualized in the right ventricle and right atrium.  LEFT VENTRICLE PLAX 2D LVIDd:         3.60 cm   Diastology LVIDs:         2.40 cm   LV e' medial:   8.05 cm/s LV PW:         0.80 cm   LV E/e' medial: 10.8 LV IVS:        0.80 cm LVOT diam:     1.80 cm LV SV:  27 LV SV Index:   18 LVOT Area:     2.54 cm  RIGHT VENTRICLE            IVC RV S prime:     9.25 cm/s  IVC diam: 2.20 cm TAPSE (M-mode): 2.1 cm LEFT ATRIUM              Index        RIGHT ATRIUM           Index LA diam:        4.60 cm  3.08 cm/m   RA Area:     29.10 cm LA Vol (A2C):   65.2 ml  43.59 ml/m  RA Volume:    96.30 ml  64.38 ml/m LA Vol (A4C):   122.0 ml 81.56 ml/m LA Biplane Vol: 88.0 ml  58.83 ml/m  AORTIC VALVE LVOT Vmax:   54.20 cm/s LVOT Vmean:  39.500 cm/s LVOT VTI:    0.107 m AI PHT:      715 msec  AORTA Ao Root diam: 2.70 cm Ao Asc diam:  3.30 cm MITRAL VALVE               TRICUSPID VALVE MV Area (PHT): 4.04 cm    TR Peak grad:   33.4 mmHg MV Decel Time: 188 msec    TR Vmax:        289.00 cm/s MV E velocity: 87.00 cm/s                            SHUNTS                            Systemic VTI:  0.11 m                            Systemic Diam: 1.80 cm Rachelle Hora Croitoru MD Electronically signed by Thurmon Fair MD Signature Date/Time: 01/25/2023/1:41:58 PM    Final    CT HEAD WO CONTRAST ( )  Result Date: 01/25/2023 CLINICAL DATA:  Provided history: Stroke, follow-up. EXAM: CT HEAD WITHOUT CONTRAST TECHNIQUE: Contiguous axial images were obtained from the base of the skull through the vertex without intravenous contrast. RADIATION DOSE REDUCTION: This exam was performed according to the departmental dose-optimization program which includes automated exposure control, adjustment of the mA and/or kV according to patient size and/or use of iterative reconstruction technique. COMPARISON:  Non-contrast head CT and CT angiogram head/neck 01/24/2023. FINDINGS: Brain: Generalized cerebral atrophy. Beam hardening artifact at the level of the mid/anterior right temporal lobe. Within this limitation, no definite acute infarct is identified. Chronic cortical/subcortical right MCA territory infarct again noted within the posterior right frontal lobe and right parietal lobe. Background mild patchy and ill-defined hypoattenuation within the cerebral white matter, nonspecific but compatible with chronic small vessel ischemic disease. Chronic infarct within the superior right cerebellar hemisphere again noted. There is no acute intracranial hemorrhage. No extra-axial fluid collection. No evidence of an intracranial mass. No  midline shift. Vascular: No hyperdense vessel.  Atherosclerotic calcifications. Skull: No calvarial fracture or aggressive osseous lesion. Sinuses/Orbits: No mass or acute finding within the imaged orbits. No significant paranasal sinus disease at the imaged levels. IMPRESSION: 1. Beam hardening artifact at the level of the mid/anterior right temporal lobe. Within this limitation, appreciable acute infarct. No intracranial hemorrhage. 2. Parenchymal atrophy, chronic small vessel ischemic disease and  chronic infarcts as described. Electronically Signed   By: Jackey Loge D.O.   On: 01/25/2023 10:42   DG Abd Portable 1V  Result Date: 01/24/2023 CLINICAL DATA:  Orogastric tube placement EXAM: PORTABLE ABDOMEN - 1 VIEW COMPARISON:  CT 02/01/2011 FINDINGS: Gastric tube extends into the decompressed stomach. Visualized bowel gas pattern unremarkable. visualized lung bases clear. No abnormal abdominal calcifications. Lower abdomen excluded. Regional bones unremarkable. IMPRESSION: Gastric tube into decompressed stomach. Electronically Signed   By: Corlis Leak M.D.   On: 01/24/2023 12:24   DG Chest Port 1 View  Result Date: 01/24/2023 CLINICAL DATA:  Intubation EXAM: PORTABLE CHEST - 1 VIEW COMPARISON:  03/31/2020 FINDINGS: Endotracheal tube tip 6.5 cm above carina. Coarse attenuated bronchovascular markings with interstitial opacities in both upper lobes. 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 Heart size and mediastinal contours are within normal limits. Aortic Atherosclerosis (ICD10-170.0). No effusion. Visualized bones unremarkable. IMPRESSION: 1. Endotracheal tube tip 6.5 cm above carina. 2. Bilateral upper lobe interstitial opacities. Electronically Signed   By: Corlis Leak M.D.   On: 01/24/2023 12:17   IR PERCUTANEOUS ART THROMBECTOMY/INFUSION INTRACRANIAL INC DIAG ANGIO  Result Date: 01/24/2023 INDICATION: 82 year old female presenting with acute left-sided weakness, left facial droop and  slurred speech; NIHSS 7. Her last known well was 10:30 p.m. on 01/23/2023. Her past medical history is significant for prior strokes with mild residual left-sided weakness, hypertension, hyperlipidemia, paroxysmal atrial fibrillation on Pradaxa having failed Eliquis, recently tested positive for COVID on 01/15/2023. Premorbid modified Rankin scale 2. Head CT showed no acute findings. CT angiogram of the head and neck showed a right M1/MCA occlusion. She was transferred to our service for mechanical thrombectomy. EXAM: ULTRASOUND-GUIDED VASCULAR ACCESS DIAGNOSTIC CEREBRAL ANGIOGRAM MECHANICAL THROMBECTOMY FLAT PANEL HEAD CT COMPARISON:  CT/CT angiogram of the head and neck 01/24/2023. MEDICATIONS: Ancef 2 g IV. ANESTHESIA/SEDATION: The procedure was performed under general anesthesia. CONTRAST:  45 mL of Omnipaque 300 milligram/mL FLUOROSCOPY: Radiation Exposure Index (as provided by the fluoroscopic device): 355 mGy Kerma COMPLICATIONS: None immediate. TECHNIQUE: Informed written consent was obtained from the patient's husband after a thorough discussion of the procedural risks, benefits and alternatives. All questions were addressed. Maximal Sterile Barrier Technique was utilized including caps, mask, sterile gowns, sterile gloves, sterile drape, hand hygiene and skin antiseptic. A timeout was performed prior to the initiation of the procedure. The right groin was prepped and draped in the usual sterile fashion. Using a micropuncture kit and the modified Seldinger technique, access was gained to the right common femoral artery and an 8 French sheath was placed. Real-time ultrasound guidance was utilized for vascular access including the acquisition of a permanent ultrasound image documenting patency of the accessed vessel. Under fluoroscopy, a Zoom 88 guide catheter was navigated over a 6 [place] VTK catheter and a 0.035" Terumo Glidewire into the aortic arch. The catheter was placed into the right common carotid  artery and then advanced into the right internal carotid artery. The diagnostic catheter was removed. Frontal and lateral angiograms of the head were obtained. FINDINGS: 1. Ultrasound showed mild atherosclerotic changes of the right common femoral artery without significant stenosis. The caliber is adequate for vascular access. 2. Occlusion of the right M1/MCA with moderate leptomeningeal collateral flow from the right ACA to the right MCA territory. 3. Mild luminal irregularity of the right carotid siphon related to atherosclerotic disease without hemodynamically significant stenosis. A 2-3 mm outpouching from the communicating segment of the right ICA corresponds to a PCOM infundibulum.  PROCEDURE: Using biplane roadmap guidance, a Cereglide 71 aspiration catheter was navigated over Colossus 35 microguidewire into the cavernous segment of the right ICA. The aspiration catheter was then advanced to the level of occlusion and connected to an aspiration pump. Continuous aspiration was performed for 2 minutes. The guide catheter was connected to a VacLok syringe. The aspiration catheter was subsequently removed under constant aspiration. The guide catheter was aspirated for debris. Right internal carotid artery angiograms with frontal lateral views of the head showed complete recanalization of the right MCA vascular tree (TICI 3). The catheter was then retracted into the right common carotid artery. Frontal and lateral angiograms of the neck were obtained showing atherosclerotic disease of the right carotid bifurcation without hemodynamically significant stenosis. Flat panel CT of the head was obtained and post processed in a separate workstation with concurrent attending physician supervision. Selected images were sent to PACS. No hemorrhagic complication. The catheter was subsequently withdrawn. Right common femoral artery angiogram was obtained in right anterior oblique view. The puncture is at the level of the  common femoral artery. The artery has normal caliber, adequate for closure device. The sheath was exchanged over the wire for a Perclose prostyle which was utilized for access closure. Immediate hemostasis was achieved. IMPRESSION: Successful and uncomplicated mechanical thrombectomy for treatment of a right M1/MCA occlusion achieving complete recanalization (TICI 3). PLAN: Transfer to ICU. Electronically Signed   By: Baldemar Lenis M.D.   On: 01/24/2023 12:07   IR CT Head Ltd  Result Date: 01/24/2023 INDICATION: 82 year old female presenting with acute left-sided weakness, left facial droop and slurred speech; NIHSS 7. Her last known well was 10:30 p.m. on 01/23/2023. Her past medical history is significant for prior strokes with mild residual left-sided weakness, hypertension, hyperlipidemia, paroxysmal atrial fibrillation on Pradaxa having failed Eliquis, recently tested positive for COVID on 01/15/2023. Premorbid modified Rankin scale 2. Head CT showed no acute findings. CT angiogram of the head and neck showed a right M1/MCA occlusion. She was transferred to our service for mechanical thrombectomy. EXAM: ULTRASOUND-GUIDED VASCULAR ACCESS DIAGNOSTIC CEREBRAL ANGIOGRAM MECHANICAL THROMBECTOMY FLAT PANEL HEAD CT COMPARISON:  CT/CT angiogram of the head and neck 01/24/2023. MEDICATIONS: Ancef 2 g IV. ANESTHESIA/SEDATION: The procedure was performed under general anesthesia. CONTRAST:  45 mL of Omnipaque 300 milligram/mL FLUOROSCOPY: Radiation Exposure Index (as provided by the fluoroscopic device): 355 mGy Kerma COMPLICATIONS: None immediate. TECHNIQUE: Informed written consent was obtained from the patient's husband after a thorough discussion of the procedural risks, benefits and alternatives. All questions were addressed. Maximal Sterile Barrier Technique was utilized including caps, mask, sterile gowns, sterile gloves, sterile drape, hand hygiene and skin antiseptic. A timeout was performed  prior to the initiation of the procedure. The right groin was prepped and draped in the usual sterile fashion. Using a micropuncture kit and the modified Seldinger technique, access was gained to the right common femoral artery and an 8 French sheath was placed. Real-time ultrasound guidance was utilized for vascular access including the acquisition of a permanent ultrasound image documenting patency of the accessed vessel. Under fluoroscopy, a Zoom 88 guide catheter was navigated over a 6 [place] VTK catheter and a 0.035" Terumo Glidewire into the aortic arch. The catheter was placed into the right common carotid artery and then advanced into the right internal carotid artery. The diagnostic catheter was removed. Frontal and lateral angiograms of the head were obtained. FINDINGS: 1. Ultrasound showed mild atherosclerotic changes of the right common femoral artery without significant  stenosis. The caliber is adequate for vascular access. 2. Occlusion of the right M1/MCA with moderate leptomeningeal collateral flow from the right ACA to the right MCA territory. 3. Mild luminal irregularity of the right carotid siphon related to atherosclerotic disease without hemodynamically significant stenosis. A 2-3 mm outpouching from the communicating segment of the right ICA corresponds to a PCOM infundibulum. PROCEDURE: Using biplane roadmap guidance, a Cereglide 71 aspiration catheter was navigated over Colossus 35 microguidewire into the cavernous segment of the right ICA. The aspiration catheter was then advanced to the level of occlusion and connected to an aspiration pump. Continuous aspiration was performed for 2 minutes. The guide catheter was connected to a VacLok syringe. The aspiration catheter was subsequently removed under constant aspiration. The guide catheter was aspirated for debris. Right internal carotid artery angiograms with frontal lateral views of the head showed complete recanalization of the right MCA  vascular tree (TICI 3). The catheter was then retracted into the right common carotid artery. Frontal and lateral angiograms of the neck were obtained showing atherosclerotic disease of the right carotid bifurcation without hemodynamically significant stenosis. Flat panel CT of the head was obtained and post processed in a separate workstation with concurrent attending physician supervision. Selected images were sent to PACS. No hemorrhagic complication. The catheter was subsequently withdrawn. Right common femoral artery angiogram was obtained in right anterior oblique view. The puncture is at the level of the common femoral artery. The artery has normal caliber, adequate for closure device. The sheath was exchanged over the wire for a Perclose prostyle which was utilized for access closure. Immediate hemostasis was achieved. IMPRESSION: Successful and uncomplicated mechanical thrombectomy for treatment of a right M1/MCA occlusion achieving complete recanalization (TICI 3). PLAN: Transfer to ICU. Electronically Signed   By: Baldemar Lenis M.D.   On: 01/24/2023 12:07   IR US Guide Vasc Access Right  Result Date: 01/24/2023 INDICATION: 82 year old female presenting with acute left-sided weakness, left facial droop and slurred speech; NIHSS 7. Her last known well was 10:30 p.m. on 01/23/2023. Her past medical history is significant for prior strokes with mild residual left-sided weakness, hypertension, hyperlipidemia, paroxysmal atrial fibrillation on Pradaxa having failed Eliquis, recently tested positive for COVID on 01/15/2023. Premorbid modified Rankin scale 2. Head CT showed no acute findings. CT angiogram of the head and neck showed a right M1/MCA occlusion. She was transferred to our service for mechanical thrombectomy. EXAM: ULTRASOUND-GUIDED VASCULAR ACCESS DIAGNOSTIC CEREBRAL ANGIOGRAM MECHANICAL THROMBECTOMY FLAT PANEL HEAD CT COMPARISON:  CT/CT angiogram of the head and neck 01/24/2023.  MEDICATIONS: Ancef 2 g IV. ANESTHESIA/SEDATION: The procedure was performed under general anesthesia. CONTRAST:  45 mL of Omnipaque 300 milligram/mL FLUOROSCOPY: Radiation Exposure Index (as provided by the fluoroscopic device): 355 mGy Kerma COMPLICATIONS: None immediate. TECHNIQUE: Informed written consent was obtained from the patient's husband after a thorough discussion of the procedural risks, benefits and alternatives. All questions were addressed. Maximal Sterile Barrier Technique was utilized including caps, mask, sterile gowns, sterile gloves, sterile drape, hand hygiene and skin antiseptic. A timeout was performed prior to the initiation of the procedure. The right groin was prepped and draped in the usual sterile fashion. Using a micropuncture kit and the modified Seldinger technique, access was gained to the right common femoral artery and an 8 French sheath was placed. Real-time ultrasound guidance was utilized for vascular access including the acquisition of a permanent ultrasound image documenting patency of the accessed vessel. Under fluoroscopy, a Zoom 88 guide catheter was  navigated over a 6 [place] VTK catheter and a 0.035" Terumo Glidewire into the aortic arch. The catheter was placed into the right common carotid artery and then advanced into the right internal carotid artery. The diagnostic catheter was removed. Frontal and lateral angiograms of the head were obtained. FINDINGS: 1. Ultrasound showed mild atherosclerotic changes of the right common femoral artery without significant stenosis. The caliber is adequate for vascular access. 2. Occlusion of the right M1/MCA with moderate leptomeningeal collateral flow from the right ACA to the right MCA territory. 3. Mild luminal irregularity of the right carotid siphon related to atherosclerotic disease without hemodynamically significant stenosis. A 2-3 mm outpouching from the communicating segment of the right ICA corresponds to a PCOM  infundibulum. PROCEDURE: Using biplane roadmap guidance, a Cereglide 71 aspiration catheter was navigated over Colossus 35 microguidewire into the cavernous segment of the right ICA. The aspiration catheter was then advanced to the level of occlusion and connected to an aspiration pump. Continuous aspiration was performed for 2 minutes. The guide catheter was connected to a VacLok syringe. The aspiration catheter was subsequently removed under constant aspiration. The guide catheter was aspirated for debris. Right internal carotid artery angiograms with frontal lateral views of the head showed complete recanalization of the right MCA vascular tree (TICI 3). The catheter was then retracted into the right common carotid artery. Frontal and lateral angiograms of the neck were obtained showing atherosclerotic disease of the right carotid bifurcation without hemodynamically significant stenosis. Flat panel CT of the head was obtained and post processed in a separate workstation with concurrent attending physician supervision. Selected images were sent to PACS. No hemorrhagic complication. The catheter was subsequently withdrawn. Right common femoral artery angiogram was obtained in right anterior oblique view. The puncture is at the level of the common femoral artery. The artery has normal caliber, adequate for closure device. The sheath was exchanged over the wire for a Perclose prostyle which was utilized for access closure. Immediate hemostasis was achieved. IMPRESSION: Successful and uncomplicated mechanical thrombectomy for treatment of a right M1/MCA occlusion achieving complete recanalization (TICI 3). PLAN: Transfer to ICU. Electronically Signed   By: Baldemar Lenis M.D.   On: 01/24/2023 12:07   CT ANGIO HEAD NECK W WO CM W PERF (CODE STROKE)  Result Date: 01/24/2023 CLINICAL DATA:  Acute stroke workup EXAM: CT ANGIOGRAPHY HEAD AND NECK CT PERFUSION BRAIN TECHNIQUE: Multidetector CT imaging of  the head and neck was performed using the standard protocol during bolus administration of intravenous contrast. Multiplanar CT image reconstructions and MIPs were obtained to evaluate the vascular anatomy. Carotid stenosis measurements (when applicable) are obtained utilizing NASCET criteria, using the distal internal carotid diameter as the denominator. Multiphase CT imaging of the brain was performed following IV bolus contrast injection. Subsequent parametric perfusion maps were calculated using RAPID software. RADIATION DOSE REDUCTION: This exam was performed according to the departmental dose-optimization program which includes automated exposure control, adjustment of the mA and/or kV according to patient size and/or use of iterative reconstruction technique. CONTRAST:  OMNIPAQUE IOHEXOL 350 MG/ML SOLN COMPARISON:  06/28/2020 FINDINGS: CTA NECK FINDINGS Aortic arch: Atheromatous plaque. The brachiocephalic origin is not covered. Right carotid system: Mixed density plaque at the bifurcation. Proximal ICA stenosis measures less than 50%. No ulceration or beading. Left carotid system: Mainly calcified plaque at the bifurcation without significant stenosis or any ulceration. Vertebral arteries: Proximal subclavian atherosclerosis without flow reducing stenosis. Atheromatous plaque at the vertebral origins, greater on the  left where there is high-grade narrowing. The right vertebral artery is dominant to a strong degree. Skeleton: No acute finding. Other neck: No acute finding Upper chest: Numerous new small pulmonary nodules in the upper lungs measuring up to 7 mm at the right apex. Review of the MIP images confirms the above findings CTA HEAD FINDINGS Anterior circulation: Abrupt cut off of the right M1 segment with poor downstream flow. Confluent atheromatous calcification of the cavernous carotids. No additional embolism/occlusion is seen. Negative for aneurysm. Generalized atheromatous irregularity of  medium size branches. Posterior circulation: Atheromatous calcification of the vertebral and basilar arteries without flow reducing stenosis. No branch occlusion, beading, or aneurysm. Fetal type left PCA flow. Bilateral atheromatous irregularity of the posterior cerebral arteries with moderate right P2 segment stenosis. Venous sinuses: None significant Anatomic variants: As above Review of the MIP images confirms the above findings CT Brain Perfusion Findings: CBF (<30%) Volume: 23 cc mainly at the right temporal lobemL Perfusion (Tmax>6.0s) volume: 228 cc, overestimated given the degree of cerebellar involvement. There is exclusion of the chronic right MCA branch infarct however.ML Mismatch Volume: Infarction Location:Right temporal Critical Value/emergent results were called by telephone at the time of interpretation on 01/24/2023 at 7:03 am to provider [facility] , who verbally acknowledged these results. IMPRESSION: 1. Emergent large vessel occlusion at the right M1 segment. Reported 20 cc infarct core; there is over estimation of the surrounding penumbra although still extensive. 2. Widespread atherosclerosis without flow reducing stenosis or clear embolic source underlying the infarct. 3. High-grade atheromatous stenosis at the non dominant left vertebral origin. 4. Pulmonary nodules in the apical lungs measuring up to 7 mm. Recommend chest CT follow-up after convalescence. Electronically Signed   By: Tiburcio Pea M.D.   On: 01/24/2023 07:13   CT HEAD CODE STROKE WO CONTRAST  Result Date: 01/24/2023 CLINICAL DATA:  Code stroke.  Slurred speech EXAM: CT HEAD WITHOUT CONTRAST TECHNIQUE: Contiguous axial images were obtained from the base of the skull through the vertex without intravenous contrast. RADIATION DOSE REDUCTION: This exam was performed according to the departmental dose-optimization program which includes automated exposure control, adjustment of the mA and/or kV according to patient  size and/or use of iterative reconstruction technique. COMPARISON:  Brain MRI 06/29/2020 FINDINGS: Brain: No evidence of acute infarction, hemorrhage, hydrocephalus, extra-axial collection or mass lesion/mass effect. Chronic right superior cerebellar and MCA branch infarct affecting the posterior frontal lobe. Chronic small vessel ischemic gliosis in the cerebral white matter. Mild for age cerebral volume loss. Vascular: Extensive atheromatous calcification Skull: Normal. Negative for fracture or focal lesion. Sinuses/Orbits: No acute finding. Other: These results were communicated to Dr. Wilford Corner at 6:50 am on 01/24/2023 by epic chat ASPECTS [facility] Stroke Program Early CT Score) on the left - Ganglionic level infarction (caudate, lentiform nuclei, internal capsule, insula, M1-M3 cortex): 7 - Supraganglionic infarction (M4-M6 cortex): 3 Total score (0-10 with 10 being normal): 10 IMPRESSION: 1. No acute finding. 2. 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 Electronically Signed   By: Tiburcio Pea M.D.   On: 01/24/2023 06:52       HISTORY OF PRESENT ILLNESS 82 y.o. female with hx of stroke x3, HLD, HTN, PAF on Pradaxa who presented to emergency department 01/24/23 morning as code stroke. Left sided weakness, facial droop, and speech change were noted at admission. LKW: 2230, 01/23/23. LVO+. COVID + 9/22. Not given TNK.  Right M1 occlusion with large penumbra. Transferred to ICU,  post IR. Now medically stable to advance to a progressive care floor.   SIGNIFICANT HOSPITAL EVENTS - 01/24/23. Admitted via ED, IR intervention. Extubated. - 01/25/23. Pt leaving ICU for progressive floor. - 01/26/23:  Pradaxa  restarted  HOSPITAL COURSE Acute Ischemic Infarct:  right MCA infarct with R M1 occlusion s/p IR with TICI3, etiology could be taking pradaxa once a day with COVID infection and paxlovid use Pt is now medically stable to exit the ICU. Will place transfer  orders to move her to the progressive neuro floor (3 Chad). Code Stroke 10/1 CT head - No acute finding. 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   CTA head & neck: Emergent large vessel occlusion at the right M1 segment. Widespread atherosclerosis. High-grade atheromatous stenosis at the non dominant left vertebral origin. CTP 20 cc infarct core with extensive penumbra  S/p IR with complete resolution of M1/MCA blockage achieving complete recanalization (TICI3). MRA  Not eligible due to pacemaker lead being in [facility]rium per vendor ([employer]) CT Head repeat Beam hardening artifact at the level of the mid/anterior right temporal lobe. Within this limitation, appreciable acute infarct. No intracranial hemorrhage. 2D Echo: EF 60 to 65% LDL 61 HgbA1c 6.6 VTE prophylaxis - Lovenox 30 Q24 Pradaxa (dabigatran) once a day prior to admission, resume home Pradaxa BID and home keppra 500 bid Therapy recommendations: CIR Disposition: CIR   History of stroke/seizure 05/2018 admitted for left-sided numbness weakness, status post TPA, MRI showed right frontal parietal infarct.  CTA head neck showed right P2, left VA, bilateral ICA siphon severe stenosis.  EF 60 to 65%.  Negative for DVT, TEE negative.  LDL 117, A1c 5.9.  Discharged with DAPT and Lipitor 40 30-day CardioNet monitoring showed A. fib patient started on Eliquis. 12/2019  Admitted for right cerebellar infarct.  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  EF 60 to 65%, LDL 81, A1c 5.9.  Eliquis changed to Pradaxa, continue Lipitor 80. 06/2020 admitted for focal seizure started on keppra. MRI negative for stroke. LDL 71 and A1C 6.4. Continued on pradaxa 150 bid and keppra.  Followed with [name] NP at [facility]  Atrial fibrillation, chronic  Home Meds: lopressor and pradaxa Taking once a day with pradaxa during the last 5-6 days with COVID and paxlovid Rate controlled Resume  pradaxa BID.    Respiratory failure, post procedure COVID infection Recent COVID infection diagnosed on 9/25 Completed paxlovid course.  Pt still COVID+ on 10/1 re-test.   Asymptomatic now Airborne isolation lifted 10/6   Hypertension Home meds:  norvasc, cozaar Stable BP goal less than 180/105  long-term BP goal normotensive   Hyperlipidemia Home meds: atorvastatin 80 LDL 61, goal < 70 Resumed home liptior 80 Continue statin at discharge   Dysphagia Speech on board On dysphagia 3 and seen liquid Avoid aspiration  Other Stroke Risk Factors Advanced age   Other Active Problems BPPV Hypokalemia - K 3.3, supplemented  DISCHARGE EXAM Blood pressure (!) 148/88, pulse 71, temperature 98.2 F (36.8 C), temperature source Oral, resp. rate 18, height 5\' 5"  (1.651 m), weight 47 kg, SpO2 100%. PHYSICAL EXAM General:  Awake and alert patient. Appears frail, thin. Psych:  Mood: "okay, considering where I am." Affect: congruent.   CV: Regular rate and rhythm on monitor. Edema of BLLE.  Respiratory: Breathing comfortably on room air at time of exam.  GI: Abdomen soft, non-tender.   NEURO:  Mental Status: Alert and Oriented  to self, place, date, situation.  Speech/Language: Patient's voice was mildly hoarse and soft. She has some lingering discomfort from extubation.     Cranial Nerves:  II: PERRL. Visual fields full to hand-waving. 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 III, IV, VI: EOMI. Eyelids elevate symmetrically.  V: Sensation is intact to light touch and symmetrical to face.  VII: Face is symmetrical resting and smiling VIII: hearing intact to voice. IX, X: Palate elevates symmetrically. Phonation is hoarse and soft. ZO:XWRUEAVW shrug 5/5. XII: tongue is midline without fasciculations. Motor: 5/5 strength to all muscle groups tested.  Tone: is normal and bulk is normal (for thin, elderly patient) Sensation- Intact to light touch bilaterally.  Extinction absent to light touch to DSS.   Coordination: FTN intact bilaterally, HKS: no ataxia in BLE.No drift.  Gait- deferred  Discharge Diet      Diet   DIET DYS 3 Room service appropriate? Yes with Assist; Fluid consistency: Thin   liquids  DISCHARGE PLAN Disposition:  Transfer to [facility] for ongoing PT, OT and ST Pradaxa (dabigatran) twice a day for secondary stroke prevention  Recommend ongoing stroke risk factor control by Primary Care Physician at time of discharge from inpatient rehabilitation. Follow-up PCP Nelwyn Salisbury, MD in 2 weeks following discharge from rehab. Follow-up in [facility] in 8 weeks following discharge from rehab, office to schedule an appointment.   35 minutes were spent preparing discharge.   Pt seen by Neuro NP/APP and later by MD. Note/plan to be edited by MD as needed.    Lynnae January, DNP, AGACNP-BC [facility] Please use AMION for contact information & EPIC for messaging.  ATTENDING ATTESTATION:    Dr. Viviann Spare evaluated pt independently, reviewed imaging, chart, labs. Discussed and formulated plan with the Resident/APP. Changes were made to the note where appropriate. Please see APP/resident note above for details.   Total 36 minutes spent on counseling patient and coordinating care, writing notes and reviewing chart.   Shereta Crothers,MD

## 2023-01-26 NOTE — Progress Notes (Signed)
  Inpatient Rehabilitation Admissions Coordinator   Met with patient and spouse at bedside for rehab assessment. We discussed goals and expectations of a possible CIR admit. She has been to CIR twice before and preference if for [facility]. Family can provide expected caregiver support that is recommended .I can admit under COVID isolation. Bed availability on CIR is limited this week. I did discuss may have to look into other rehab venues including [facility] if no bed available soon. Please call me with any questions.   Ottie Glazier, RN, MSN Rehab Admissions Coordinator [phone]

## 2023-01-26 NOTE — Progress Notes (Signed)
PHARMACY - ANTICOAGULATION CONSULT NOTE  Pharmacy Consult for Dabigatran  Indication: atrial fibrillation  Allergies  Allergen Reactions   Duloxetine     Causes Bp elevation.  Increased body jerks, nausea   Acetaminophen Hives and Rash    Patient Measurements: Height: 5\' 5"  (165.1 cm) Weight: 47 kg (103 lb 9.9 oz) IBW/kg (Calculated) : 57  Vital Signs: Temp: 97.8 F (36.6 C) (10/03 0700) Temp Source: Oral (10/03 0700) BP: 134/77 (10/03 0700) Pulse Rate: 70 (10/03 0700)  Labs: Recent Labs    01/24/23 2218 01/25/23 0818 01/26/23 0717  HGB 14.6 17.5* 15.0  HCT 43.2 52.7* 44.3  PLT 198 168 201  APTT 26  --   --   LABPROT 14.3  --   --   INR 1.1  --   --   CREATININE 0.57 0.68 0.62    Estimated Creatinine Clearance: 39.5 mL/min (by C-G formula based on SCr of 0.62 mg/dL).   Medical History: Past Medical History:  Diagnosis Date   Allergy    CVA (cerebral vascular accident) (HCC) 05/2018, 01/09/20   Hyperlipidemia LDL goal <70    Hypertension    Pacemaker    Palpitations    Vertigo, benign positional     Assessment:  82 y.o. female with hx of strokes and pAF on Pradaxa who is admitted for new stroke. COVID + 9/22. Not given TNK.  Found to have right M1 occlusion with large penumbra. Now medically stable. Pharmacy consulted to resume PTA Pradaxa.  Goal of Therapy:  Monitor platelets by anticoagulation protocol: Yes   Plan:  Dabigatran 150mg  PO twice daily (resuming from PTA) Continue to monitor H&H and platelets   Thank you for allowing pharmacy to be a part of this patient's care.  Thelma Barge, PharmD Clinical Pharmacist

## 2023-01-26 NOTE — Plan of Care (Signed)
  Problem: Nutrition: Goal: Risk of aspiration will decrease Outcome: Progressing   Problem: Nutrition: Goal: Dietary intake will improve Outcome: Progressing   Problem: Activity: Goal: Ability to return to baseline activity level will improve Outcome: Progressing   Problem: Cardiovascular: Goal: Ability to achieve and maintain adequate cardiovascular perfusion will improve Outcome: Progressing   Problem: Health Behavior/Discharge Planning: Goal: Ability to safely manage health-related needs after discharge will improve Outcome: Progressing   Problem: Activity: Goal: Ability to tolerate increased activity will improve Outcome: Progressing   Problem: Respiratory: Goal: Ability to maintain a clear airway and adequate ventilation will improve Outcome: Progressing

## 2023-01-27 DIAGNOSIS — I63511 Cerebral infarction due to unspecified occlusion or stenosis of right middle cerebral artery: Secondary | ICD-10-CM | POA: Diagnosis not present

## 2023-01-27 LAB — BASIC METABOLIC PANEL
Anion gap: 10 (ref 5–15)
BUN: 12 mg/dL (ref 8–23)
CO2: 24 mmol/L (ref 22–32)
Calcium: 8.2 mg/dL — ABNORMAL LOW (ref 8.9–10.3)
Chloride: 100 mmol/L (ref 98–111)
Creatinine, Ser: 0.48 mg/dL (ref 0.44–1.00)
GFR, Estimated: >60 mL/min (ref >60)
Glucose, Bld: 109 mg/dL — ABNORMAL HIGH (ref 70–99)
Potassium: 3.7 mmol/L (ref 3.5–5.1)
Sodium: 134 mmol/L — ABNORMAL LOW (ref 135–145)

## 2023-01-27 LAB — CBC
HCT: 39.7 % (ref 36.0–46.0)
Hemoglobin: 13.4 g/dL (ref 12.0–15.0)
MCH: 30.3 pg (ref 26.0–34.0)
MCHC: 33.8 g/dL (ref 30.0–36.0)
MCV: 89.8 fL (ref 80.0–100.0)
Platelets: 185 10*3/uL (ref 150–400)
RBC: 4.42 MIL/uL (ref 3.87–5.11)
RDW: 12.9 % (ref 11.5–15.5)
WBC: 11.3 10*3/uL — ABNORMAL HIGH (ref 4.0–10.5)
nRBC: 0 % (ref 0.0–0.2)

## 2023-01-27 LAB — GLUCOSE, CAPILLARY
Glucose-Capillary: 114 mg/dL — ABNORMAL HIGH (ref 70–99)
Glucose-Capillary: 102 mg/dL — ABNORMAL HIGH (ref 70–99)
Glucose-Capillary: 136 mg/dL — ABNORMAL HIGH (ref 70–99)
Glucose-Capillary: 98 mg/dL (ref 70–99)
Glucose-Capillary: 116 mg/dL — ABNORMAL HIGH (ref 70–99)

## 2023-01-27 MED ORDER — INSULIN ASPART 100 UNIT/ML IJ SOLN
0.0000 [IU] | Freq: Three times a day (TID) | INTRAMUSCULAR | Status: DC
  Administered 2023-01-28: 1 [IU] via SUBCUTANEOUS

## 2023-01-27 NOTE — Progress Notes (Signed)
Physical Therapy Treatment Patient Details Name: Mary Mercado MRN: [id number] DOB: [birth year] Today's Date: 01/27/2023   History of Present Illness Pt presented to the ED 10/1 with c/o L side weakness and slurred speech. She tested positive for covid 9/22. CTA revealed R M1 occlusion. She emergently underwent thrombectomy. PMH: hx of stroke x3, HLD, HTN, PAF, BPPV, s/p pacemaker    PT Comments  Pleasant and eager to participate this afternoon. Worked on functional capacity with gait, min assist for light balance and RW control but mostly at CGA level today. Some difficulty in congested areas with increased shuffling, likely a component of her low vision. CGA for safety with transfers from various surfaces. Left upright in chair for dinner, NT in room assessing scheduled vitals. Pt daughter in room. Patient will continue to benefit from skilled physical therapy services to further improve independence with functional mobility. Patient will benefit from intensive inpatient follow up therapy, >3 hours/day     If plan is discharge home, recommend the following: A little help with walking and/or transfers;A little help with bathing/dressing/bathroom;Assistance with cooking/housework;Assist for transportation   Can travel by private vehicle        Equipment Recommendations  None recommended by PT    Recommendations for Other Services Rehab consult     Precautions / Restrictions Precautions Precautions: Fall;Other (comment) Precaution Comments: covid Restrictions Weight Bearing Restrictions: No     Mobility  Bed Mobility Overal bed mobility: Needs Assistance Bed Mobility: Supine to Sit     Supine to sit: Supervision, Used rails, HOB elevated     General bed mobility comments: Extra time, no physical assist but provided cues for technique with low HOB setting.    Transfers Overall transfer level: Needs assistance Equipment used: Rolling walker (2 wheels) Transfers:  Sit to/from Stand Sit to Stand: Contact guard assist           General transfer comment: CGA for safety and balance from bed and toilet today. Slow to rise, cues for hand placement. Minor posterior lean noted.    Ambulation/Gait Ambulation/Gait assistance: Min assist Gait Distance (Feet): 75 Feet Assistive device: Rolling walker (2 wheels) Gait Pattern/deviations: Step-through pattern, Decreased stride length, Shuffle Gait velocity: decreased Gait velocity interpretation: <1.31 ft/sec, indicative of household ambulator   General Gait Details: Good posture but slow and intermittently shuffling steps, especially in congested areas. min assist for RW control rarely, mostly at [facility] level with minor instability despite RW support. Cues for awareness, technique, and walker proximity with turns.   Stairs             Wheelchair Mobility     Tilt Bed    Modified Rankin (Stroke Patients Only) Modified Rankin (Stroke Patients Only) Pre-Morbid Rankin Score: Slight disability Modified Rankin: Moderately severe disability     Balance Overall balance assessment: Needs assistance Sitting-balance support: Feet supported, No upper extremity supported Sitting balance-Leahy Scale: Good Sitting balance - Comments: sits EOB and toilet without UE support supervision level   Standing balance support: No upper extremity supported Standing balance-Leahy Scale: Fair Standing balance comment: reliant on UE support for ambulation, fair in static standing at sink                            Cognition Arousal: Alert Behavior During Therapy: WFL for tasks assessed/performed Overall Cognitive Status: Impaired/Different from baseline Area of Impairment: Problem solving  Problem Solving: Slow processing, Decreased initiation, Requires verbal cues          Exercises      General Comments General comments (skin integrity, edema, etc.): HR  in 70s throughout. Minimal DOE      Pertinent Vitals/Pain Pain Assessment Pain Assessment: No/denies pain    Home Living                          Prior Function            PT Goals (current goals can now be found in the care plan section) Acute Rehab PT Goals Patient Stated Goal: rehab then home PT Goal Formulation: With patient Time For Goal Achievement: 02/08/23 Potential to Achieve Goals: Good Progress towards PT goals: Progressing toward goals    Frequency    Min 1X/week      PT Plan      Co-evaluation              AM-PAC PT "6 Clicks" Mobility   Outcome Measure  Help needed turning from your back to your side while in a flat bed without using bedrails?: A Little Help needed moving from lying on your back to sitting on the side of a flat bed without using bedrails?: A Little Help needed moving to and from a bed to a chair (including a wheelchair)?: A Little Help needed standing up from a chair using your arms (e.g., wheelchair or bedside chair)?: A Little Help needed to walk in hospital room?: A Little Help needed climbing 3-5 steps with a railing? : A Lot 6 Click Score: 17    End of Session Equipment Utilized During Treatment: Gait belt Activity Tolerance: Patient tolerated treatment well Patient left: with call bell/phone within reach;in chair;with chair alarm set;with SCD's reapplied;with family/visitor present;with nursing/sitter in room   PT Visit Diagnosis: Other abnormalities of gait and mobility (R26.89)     Time: 0981-1914 PT Time Calculation (min) (ACUTE ONLY): 25 min  Charges:    $Gait Training: 8-22 mins $Therapeutic Activity: 8-22 mins PT General Charges $$ ACUTE PT VISIT: 1 Visit                     Kathlyn Sacramento, PT, DPT [facility]  Rehabilitation Services Physical Therapist Office: [phone] Website: [email or web address]    Berton Mount 01/27/2023, 5:34 PM

## 2023-01-27 NOTE — Plan of Care (Signed)
  Problem: Education: Goal: Knowledge of disease or condition will improve Outcome: Progressing Goal: Knowledge of secondary prevention will improve (MUST DOCUMENT ALL) Outcome: Progressing Goal: Knowledge of patient specific risk factors will improve (Mark N/A or DELETE if not current risk factor) Outcome: Progressing   Problem: Ischemic Stroke/TIA Tissue Perfusion: Goal: Complications of ischemic stroke/TIA will be minimized Outcome: Progressing   Problem: Coping: Goal: Will verbalize positive feelings about self Outcome: Progressing Goal: Will identify appropriate support needs Outcome: Progressing   Problem: Health Behavior/Discharge Planning: Goal: Ability to manage health-related needs will improve Outcome: Progressing Goal: Goals will be collaboratively established with patient/family Outcome: Progressing   Problem: Self-Care: Goal: Ability to participate in self-care as condition permits will improve Outcome: Progressing Goal: Verbalization of feelings and concerns over difficulty with self-care will improve Outcome: Progressing Goal: Ability to communicate needs accurately will improve Outcome: Progressing   Problem: Nutrition: Goal: Risk of aspiration will decrease Outcome: Progressing Goal: Dietary intake will improve Outcome: Progressing   Problem: Education: Goal: Understanding of CV disease, CV risk reduction, and recovery process will improve Outcome: Progressing Goal: Individualized Educational Video(s) Outcome: Progressing   Problem: Activity: Goal: Ability to return to baseline activity level will improve Outcome: Progressing   Problem: Cardiovascular: Goal: Ability to achieve and maintain adequate cardiovascular perfusion will improve Outcome: Progressing Goal: Vascular access site(s) Level 0-1 will be maintained Outcome: Progressing   Problem: Health Behavior/Discharge Planning: Goal: Ability to safely manage health-related needs after  discharge will improve Outcome: Progressing   Problem: Activity: Goal: Ability to tolerate increased activity will improve Outcome: Progressing   Problem: Respiratory: Goal: Ability to maintain a clear airway and adequate ventilation will improve Outcome: Progressing   Problem: Role Relationship: Goal: Method of communication will improve Outcome: Progressing   Problem: Education: Goal: Knowledge of General Education information will improve Description: Including pain rating scale, medication(s)/side effects and non-pharmacologic comfort measures Outcome: Progressing   Problem: Health Behavior/Discharge Planning: Goal: Ability to manage health-related needs will improve Outcome: Progressing   Problem: Clinical Measurements: Goal: Ability to maintain clinical measurements within normal limits will improve Outcome: Progressing Goal: Will remain free from infection Outcome: Progressing Goal: Diagnostic test results will improve Outcome: Progressing Goal: Respiratory complications will improve Outcome: Progressing Goal: Cardiovascular complication will be avoided Outcome: Progressing   Problem: Activity: Goal: Risk for activity intolerance will decrease Outcome: Progressing   Problem: Nutrition: Goal: Adequate nutrition will be maintained Outcome: Progressing   Problem: Coping: Goal: Level of anxiety will decrease Outcome: Progressing   Problem: Elimination: Goal: Will not experience complications related to bowel motility Outcome: Progressing Goal: Will not experience complications related to urinary retention Outcome: Progressing   Problem: Pain Managment: Goal: General experience of comfort will improve Outcome: Progressing   Problem: Safety: Goal: Ability to remain free from injury will improve Outcome: Progressing   Problem: Skin Integrity: Goal: Risk for impaired skin integrity will decrease Outcome: Progressing   

## 2023-01-27 NOTE — Plan of Care (Signed)
  Problem: Education: Goal: Knowledge of disease or condition will improve 01/27/2023 0423 by Rosette Reveal, RN Outcome: Progressing 01/27/2023 0351 by Rosette Reveal, RN Outcome: Progressing Goal: Knowledge of secondary prevention will improve (MUST DOCUMENT ALL) Outcome: Progressing Goal: Knowledge of patient specific risk factors will improve Loraine Leriche N/A or DELETE if not current risk factor) Outcome: Progressing   Problem: Ischemic Stroke/TIA Tissue Perfusion: Goal: Complications of ischemic stroke/TIA will be minimized 01/27/2023 0423 by Rosette Reveal, RN Outcome: Progressing 01/27/2023 0351 by Rosette Reveal, RN Outcome: Progressing   Problem: Coping: Goal: Will verbalize positive feelings about self Outcome: Progressing Goal: Will identify appropriate support needs 01/27/2023 0423 by Rosette Reveal, RN Outcome: Progressing 01/27/2023 0351 by Rosette Reveal, RN Outcome: Progressing   Problem: Health Behavior/Discharge Planning: Goal: Ability to manage health-related needs will improve 01/27/2023 0423 by Rosette Reveal, RN Outcome: Progressing 01/27/2023 0351 by Rosette Reveal, RN Outcome: Progressing Goal: Goals will be collaboratively established with patient/family Outcome: Progressing   Problem: Self-Care: Goal: Ability to participate in self-care as condition permits will improve 01/27/2023 0423 by Rosette Reveal, RN Outcome: Progressing 01/27/2023 0351 by Rosette Reveal, RN Outcome: Progressing Goal: Verbalization of feelings and concerns over difficulty with self-care will improve Outcome: Progressing Goal: Ability to communicate needs accurately will improve Outcome: Progressing   Problem: Nutrition: Goal: Risk of aspiration will decrease Outcome: Progressing Goal: Dietary intake will improve Outcome: Progressing   Problem: Health Behavior/Discharge Planning: Goal: Ability to safely manage health-related needs after discharge will improve Outcome:  Progressing

## 2023-01-27 NOTE — Progress Notes (Addendum)
Inpatient Rehabilitation Admissions Coordinator   I have CIR bed to admit her on Sunday. I met with patient and her spouse at bedside and they are aware. [facility] nurse can call rehab Sunday at 12 noon to clarify when bed available and to give report at [phone]. Acute team and TOC made aware. I will make the arrangements. Per Dr Roda Shutters, COVID isolation to be discontinued Saturday.  Ottie Glazier, RN, MSN Rehab Admissions Coordinator [phone] 01/27/2023 11:43 AM  I have requested her secondary [employer] policy of [phone] to be added to her acute chart as well as CIR chart.  Ottie Glazier, RN, MSN Rehab Admissions Coordinator [phone] 01/27/2023 3:54 PM

## 2023-01-27 NOTE — TOC Transition Note (Signed)
Transition of Care [facility]) - CM/SW Discharge Note   Patient Details  Name: Mary Mercado MRN: [id number] Date of Birth: [birth year]  [facility] CM/SW Contact:  Kermit Balo, RN Phone Number: 01/27/2023, 10:59 AM   Clinical Narrative:     [facility] is from [facility]. She has approval for CIR and will admit on Sunday. No further needs per TOC.  Final next level of care: IP Rehab Facility Barriers to Discharge: No Barriers Identified   Patient Goals and CMS Choice CMS Medicare.gov Compare Post Acute Care list provided to:: Patient Choice offered to / list presented to : Patient, Spouse  Discharge Placement                         Discharge Plan and Services Additional resources added to the After Visit Summary for                                       Social Determinants of Health (SDOH) Interventions SDOH Screenings   Food Insecurity: No Food Insecurity (01/24/2023)  Housing: Low Risk  (01/24/2023)  Transportation Needs: No Transportation Needs (01/24/2023)  Utilities: Not At Risk (01/24/2023)  Alcohol Screen: Low Risk  (05/27/2022)  Depression (PHQ2-9): Low Risk  (05/27/2022)  Financial Resource Strain: Low Risk  (05/27/2022)  Physical Activity: Insufficiently Active (05/27/2022)  Social Connections: Socially Integrated (05/27/2022)  Stress: No Stress Concern Present (05/27/2022)  Tobacco Use: Low Risk  (01/25/2023)     Readmission Risk Interventions     No data to display

## 2023-01-27 NOTE — H&P (Incomplete)
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  Per chart review patient lives with spouse.  Independent living facility/[facility].  Used a rollator for mobility.  Presented 01/24/2023 with acute onset of left-sided weakness and slurred speech.  Cranial CT scan negative for acute findings.  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  Underwent emergent thrombectomy 01/24/2023 per interventional radiology.  Echocardiogram with ejection fraction of 60 to 65% no wall motion abnormalities.  Follow-up CT showed beam hardening artifact at the level of the mid/anterior right temporal lobe no intracranial hemorrhage.  In regards to patient recent COVID infection diagnosed 9/25/ 2024 completed Paxlovid course.  Follow-up testing 10/1 positive patient asymptomatic currently on airborne contact precautions..  She is currently maintained on Pradaxa for CVA prophylaxis.  Therapy evaluations completed due to patient decreased functional mobility and left-sided weakness was admitted for a comprehensive rehab program.  Review of Systems  Constitutional:  Negative for chills and fever.  HENT:  Negative for hearing loss.   Eyes:  Negative for blurred vision and double vision.  Respiratory:  Negative for cough, shortness of breath and wheezing.   Cardiovascular:  Positive for palpitations. Negative for chest pain and leg swelling.  Gastrointestinal:  Positive for constipation. Negative for heartburn, nausea and vomiting.  Genitourinary:  Negative for dysuria,  flank pain and hematuria.  Musculoskeletal:  Positive for joint pain and myalgias.  Skin:  Negative for rash.  Neurological:  Positive for speech change, seizures and weakness.  All other systems reviewed and are negative.  Past Medical History:  Diagnosis Date   Allergy    CVA (cerebral vascular accident) (HCC) 05/2018, 01/09/20   Hyperlipidemia LDL goal <70    Hypertension    Pacemaker    Palpitations    Vertigo, benign positional    Past Surgical History:  Procedure Laterality Date   AV NODE ABLATION N/A 03/30/2020   Procedure: AV NODE ABLATION;  Surgeon: Duke Salvia, MD;  Location: [facility] INVASIVE CV LAB;  Service: Cardiovascular;  Laterality: N/A;   AV NODE ABLATION N/A 03/30/2020   Procedure: AV NODE ABLATION;  Surgeon: Duke Salvia, MD;  Location: [facility] INVASIVE CV LAB;  Service: Cardiovascular;  Laterality: N/A;   CARDIOVERSION Left 10/31/2018   Procedure: CARDIOVERSION;  Surgeon: Elease Hashimoto Deloris Ping, MD;  Location: [facility] ENDOSCOPY;  Service: Cardiovascular;  Laterality: Left;   CARDIOVERSION N/A 01/08/2020   Procedure: CARDIOVERSION;  Surgeon: Parke Poisson, MD;  Location: [facility] ENDOSCOPY;  Service: Cardiovascular;  Laterality: N/A;   Hemilaminectomy and microdiskectomy at L4-5 on the left.  12/10/2003   IR CT HEAD LTD  01/24/2023   IR PERCUTANEOUS ART THROMBECTOMY/INFUSION INTRACRANIAL INC DIAG ANGIO  01/24/2023   IR US GUIDE VASC ACCESS RIGHT  01/24/2023   PACEMAKER IMPLANT N/A 03/30/2020   Procedure: PACEMAKER IMPLANT;  Surgeon: Duke Salvia, MD;  Location: [facility] INVASIVE CV LAB;  Service: Cardiovascular;  Laterality: N/A;   RADIOLOGY WITH ANESTHESIA N/A 01/24/2023   Procedure: IR WITH ANESTHESIA;  Surgeon: Radiologist, Medication, MD;  Location: [facility] OR;  Service: Radiology;  Laterality: N/A;  TEE WITHOUT CARDIOVERSION N/A 06/05/2018   Procedure: TRANSESOPHAGEAL ECHOCARDIOGRAM (TEE);  Surgeon: Lewayne Bunting, MD;  Location: [facility] ENDOSCOPY;  Service: Cardiovascular;  Laterality: N/A;   loop   Family History  Problem Relation Age of Onset   Diabetes Mother    Heart attack Mother        11   Colon cancer Father    Heart attack Brother 57   Sleep apnea Neg Hx    Social History:  reports that she has never smoked. She has never used smokeless tobacco. She reports that she does not drink alcohol and does not use drugs. Allergies:  Allergies  Allergen Reactions   Duloxetine     Causes Bp elevation.  Increased body jerks, nausea   Acetaminophen Hives and Rash   Medications Prior to Admission  Medication Sig Dispense Refill   amLODipine (NORVASC) 2.5 MG tablet TAKE 1 TABLET BY MOUTH EVERY DAY 90 tablet 0   atorvastatin (LIPITOR) 80 MG tablet TAKE 1 TABLET BY MOUTH DAILY AT 6 PM. 90 tablet 1   dabigatran (PRADAXA) 150 MG CAPS capsule Take 1 capsule (150 mg total) by mouth 2 (two) times daily. 180 capsule 1   dorzolamide-timolol (COSOPT) 2-0.5 % ophthalmic solution Place 1 drop into both eyes 2 (two) times daily.     fluorouracil (EFUDEX) 5 % cream Apply 1 Application topically 2 (two) times daily.     levETIRAcetam (KEPPRA) 500 MG tablet Take 1 tablet (500 mg total) by mouth 2 (two) times daily. 180 tablet 3   losartan (COZAAR) 50 MG tablet TAKE 2 TABLETS BY MOUTH EVERY DAY 180 tablet 0   metoprolol tartrate (LOPRESSOR) 25 MG tablet TAKE 1/2 TABLET BY MOUTH EVERY DAY AT BEDTIME 45 tablet 2   Tafluprost, PF, 0.0015 % SOLN Place 1 drop into both eyes daily.     [EXPIRED] nirmatrelvir/ritonavir (PAXLOVID) 20 x 150 MG & 10 x 100MG  TABS Take 3 tablets by mouth 2 (two) times daily for 5 days. (Take nirmatrelvir 150 mg two tablets twice daily for 5 days and ritonavir 100 mg one tablet twice daily for 5 days) Patient GFR is 82 (Patient not taking: Reported on 01/24/2023) 30 tablet 0      Home: Home Living Family/patient expects to be discharged to:: Private residence Living Arrangements: Spouse/significant other Available Help at Discharge: Family, Available 24  hours/day Type of Home: Independent living facility Home Access: Elevator Home Layout: One level Bathroom Shower/Tub: Health visitor: Handicapped height Bathroom Accessibility: Yes Home Equipment: Rollator (4 wheels), Shower seat - built in, Grab bars - tub/shower, Grab bars - toilet, Hand held shower head Additional Comments: Pt lives at [facility] [employer] area)  Lives With: Spouse   Functional History: Prior Function Prior Level of Function : Independent/Modified Independent Mobility Comments: rollator for amb ADLs Comments: goes to dining room for 1 meal/day, sits to shower, manages meds with husband  Functional Status:  Mobility: Bed Mobility Overal bed mobility: Needs Assistance Bed Mobility: Supine to Sit Supine to sit: Supervision, Used rails, HOB elevated Sit to supine: Contact guard assist General bed mobility comments: Extra time, no physical assist but provided cues for technique with low HOB setting. Transfers Overall transfer level: Needs assistance Equipment used: Rolling walker (2 wheels) Transfers: Sit to/from Stand Sit to Stand: Contact guard assist Bed to/from chair/wheelchair/BSC transfer type:: Step pivot Step pivot transfers: Min assist General transfer comment: CGA for safety and balance from bed and toilet today. Slow to rise, cues  for hand placement. Minor posterior lean noted. Ambulation/Gait Ambulation/Gait assistance: Min assist Gait Distance (Feet): 75 Feet Assistive device: Rolling walker (2 wheels) Gait Pattern/deviations: Step-through pattern, Decreased stride length, Shuffle General Gait Details: Good posture but slow and intermittently shuffling steps, especially in congested areas. min assist for RW control rarely, mostly at [facility] level with minor instability despite RW support. Cues for awareness, technique, and walker proximity with turns. Gait velocity: decreased Gait velocity interpretation: <1.31 ft/sec,  indicative of household ambulator    ADL: ADL Overall ADL's : Needs assistance/impaired Eating/Feeding: Set up, Sitting Eating/Feeding Details (indicate cue type and reason): using L UE to hold applesauce cup Grooming: Wash/dry hands, Wash/dry face, Brushing hair, Oral care, Sitting, Set up Upper Body Bathing: Minimal assistance, Sitting Upper Body Bathing Details (indicate cue type and reason): washed pt's back Lower Body Bathing: Contact guard assist, Sit to/from stand Upper Body Dressing : Set up, Sitting Lower Body Dressing: Contact guard assist, Sit to/from stand Lower Body Dressing Details (indicate cue type and reason): can don and doff socks Toilet Transfer: Contact guard assist, Ambulation, Regular Toilet, Grab bars, Rolling walker (2 wheels) Toileting- Clothing Manipulation and Hygiene: Minimal assistance, Sit to/from stand Toileting - Clothing Manipulation Details (indicate cue type and reason): assist for toilet paper out of dispenser Functional mobility during ADLs: Contact guard assist, Rolling walker (2 wheels)  Cognition: Cognition Overall Cognitive Status: Impaired/Different from baseline Orientation Level: Oriented X4 Cognition Arousal: Alert Behavior During Therapy: WFL for tasks assessed/performed Overall Cognitive Status: Impaired/Different from baseline Area of Impairment: Problem solving Problem Solving: Slow processing, Decreased initiation, Requires verbal cues General Comments: likely baseline  Physical Exam: Blood pressure 119/83, pulse 70, temperature 97.8 F (36.6 C), temperature source Oral, resp. rate 18, height 5\' 5"  (1.651 m), weight 47 kg, SpO2 98%. Physical Exam Neurological:     Comments: Patient is alert sitting up in bed.  Makes eye contact with examiner.  Provides name and age.  Speech is a bit hoarse and soft.  She does follow commands.     Results for orders placed or performed during the hospital encounter of 01/24/23 (from the past  48 hour(s))  Glucose, capillary     Status: Abnormal   Collection Time: 01/28/23  6:10 AM  Result Value Ref Range   Glucose-Capillary 112 (H) 70 - 99 mg/dL    Comment: Glucose reference range applies only to samples taken after fasting for at least 8 hours.   Comment 1 Notify RN   Glucose, capillary     Status: Abnormal   Collection Time: 01/28/23 11:15 AM  Result Value Ref Range   Glucose-Capillary 196 (H) 70 - 99 mg/dL    Comment: Glucose reference range applies only to samples taken after fasting for at least 8 hours.  Glucose, capillary     Status: None   Collection Time: 01/28/23  4:56 PM  Result Value Ref Range   Glucose-Capillary 82 70 - 99 mg/dL    Comment: Glucose reference range applies only to samples taken after fasting for at least 8 hours.  Glucose, capillary     Status: Abnormal   Collection Time: 01/28/23  9:23 PM  Result Value Ref Range   Glucose-Capillary 126 (H) 70 - 99 mg/dL    Comment: Glucose reference range applies only to samples taken after fasting for at least 8 hours.   Comment 1 Notify RN   Glucose, capillary     Status: Abnormal   Collection Time: 01/29/23  7:09 AM  Result Value Ref Range   Glucose-Capillary 136 (H) 70 - 99 mg/dL    Comment: Glucose reference range applies only to samples taken after fasting for at least 8 hours.   Comment 1 Notify RN   Basic metabolic panel     Status: Abnormal   Collection Time: 01/29/23 11:56 AM  Result Value Ref Range   Sodium 137 135 - 145 mmol/L   Potassium 4.3 3.5 - 5.1 mmol/L   Chloride 100 98 - 111 mmol/L   CO2 24 22 - 32 mmol/L   Glucose, Bld 110 (H) 70 - 99 mg/dL    Comment: Glucose reference range applies only to samples taken after fasting for at least 8 hours.   BUN 12 8 - 23 mg/dL   Creatinine, Ser 1.61 0.44 - 1.00 mg/dL   Calcium 8.9 8.9 - 09.6 mg/dL   GFR, Estimated >04 >54 mL/min    Comment: (NOTE) Calculated using the CKD-EPI Creatinine Equation (2021)    Anion gap 13 5 - 15    Comment:  Performed at [facility], [address] [place], [place] [address]  CBC     Status: None   Collection Time: 01/29/23 11:56 AM  Result Value Ref Range   WBC 7.6 4.0 - 10.5 K/uL   RBC 4.75 3.87 - 5.11 MIL/uL   Hemoglobin 14.4 12.0 - 15.0 g/dL   HCT 91.4 78.2 - 95.6 %   MCV 91.8 80.0 - 100.0 fL   MCH 30.3 26.0 - 34.0 pg   MCHC 33.0 30.0 - 36.0 g/dL   RDW 21.3 08.6 - 57.8 %   Platelets 242 150 - 400 K/uL   nRBC 0.0 0.0 - 0.2 %    Comment: Performed at [facility], [address] [place], [place] [address]  Glucose, capillary     Status: None   Collection Time: 01/29/23 12:36 PM  Result Value Ref Range   Glucose-Capillary 98 70 - 99 mg/dL    Comment: Glucose reference range applies only to samples taken after fasting for at least 8 hours.  Glucose, capillary     Status: Abnormal   Collection Time: 01/29/23  4:31 PM  Result Value Ref Range   Glucose-Capillary 110 (H) 70 - 99 mg/dL    Comment: Glucose reference range applies only to samples taken after fasting for at least 8 hours.  Glucose, capillary     Status: Abnormal   Collection Time: 01/29/23  9:06 PM  Result Value Ref Range   Glucose-Capillary 116 (H) 70 - 99 mg/dL    Comment: Glucose reference range applies only to samples taken after fasting for at least 8 hours.   Comment 1 Notify RN    Comment 2 Document in Chart    No results found.    Blood pressure 119/83, pulse 70, temperature 97.8 F (36.6 C), temperature source Oral, resp. rate 18, height 5\' 5"  (1.651 m), weight 47 kg, SpO2 98%.  Medical Problem List and Plan: 1. 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  Antithrombotics: -DVT/anticoagulation:  Pharmaceutical: Other (comment) Pradaxa  -antiplatelet therapy: N/A 3. Pain Management: Tylenol as needed 4. Mood/Behavior/Sleep:  Provide emotional support  -antipsychotic agents: N/A 5. Neuropsych/cognition: This patient is capable of making decisions on her own behalf. 6. Skin/Wound Care: Routine skin checks 7. Fluids/Electrolytes/Nutrition: Routine in  and outs with follow-up chemistries 8.  COVID infection.  Recent COVID infection diagnosed 9/25.  Completed Paxlovid course.  Follow-up testing +10/1 on retest patient asymptomatic.  Precautions completed 01/29/2023 9.  Atrial fibrillation/pacemaker.  Pradaxa.  Cardiac rate controlled 10.  History of seizure.  Keppra 500 mg twice daily 11.  Hyperlipidemia.  Lipitor     Mcarthur Rossetti Pattrick Bady, PA-C 01/30/2023

## 2023-01-27 NOTE — Plan of Care (Signed)
  Problem: Education: Goal: Knowledge of disease or condition will improve Outcome: Progressing   Problem: Ischemic Stroke/TIA Tissue Perfusion: Goal: Complications of ischemic stroke/TIA will be minimized Outcome: Progressing   Problem: Coping: Goal: Will identify appropriate support needs Outcome: Progressing   Problem: Health Behavior/Discharge Planning: Goal: Ability to manage health-related needs will improve Outcome: Progressing   Problem: Self-Care: Goal: Ability to participate in self-care as condition permits will improve Outcome: Progressing   Problem: Health Behavior/Discharge Planning: Goal: Ability to safely manage health-related needs after discharge will improve Outcome: Progressing

## 2023-01-27 NOTE — Progress Notes (Addendum)
STROKE TEAM PROGRESS NOTE  BRIEF HPI [name] is a 82 y.o. female with hx of stroke x3, HLD, HTN, PAF on Pradaxa who presented to emergency department 01/24/23 morning as code stroke. Left sided weakness, facial droop, and speech change were noted at admission. LKW: 2230, 01/23/23. LVO+. COVID + 9/22. Not given TNK.  Right M1 occlusion with large penumbra. Transferred to ICU, post IR. Now medically stable to advance to a progressive care floor.   SIGNIFICANT HOSPITAL EVENTS - 01/24/23. Admitted via ED, IR intervention. Extubated. - 01/25/23. Pt leaving ICU for progressive floor. - 01/26/23 starting pradaxa   INTERIM HISTORY/SUBJECTIVE Family at the bedside. Patient is sitting on the side of the bed eating lunch  Neurological exam is stable and unchanged  Plan for CIR on Sunday    OBJECTIVE  CBC    Component Value Date/Time   WBC 11.3 (H) 01/27/2023 0700   RBC 4.42 01/27/2023 0700   HGB 13.4 01/27/2023 0700   HGB 13.9 01/28/2021 1020   HCT 39.7 01/27/2023 0700   HCT 43.4 01/28/2021 1020   PLT 185 01/27/2023 0700   PLT 254 01/28/2021 1020   MCV 89.8 01/27/2023 0700   MCV 90 01/28/2021 1020   MCH 30.3 01/27/2023 0700   MCHC 33.8 01/27/2023 0700   RDW 12.9 01/27/2023 0700   RDW 12.5 01/28/2021 1020   LYMPHSABS 0.9 01/24/2023 2218   MONOABS 0.7 01/24/2023 2218   EOSABS 0.0 01/24/2023 2218   BASOSABS 0.0 01/24/2023 2218    BMET    Component Value Date/Time   NA 134 (L) 01/27/2023 0700   NA 143 09/21/2021 1600   K 3.7 01/27/2023 0700   CL 100 01/27/2023 0700   CO2 24 01/27/2023 0700   GLUCOSE 109 (H) 01/27/2023 0700   BUN 12 01/27/2023 0700   BUN 14 09/21/2021 1600   CREATININE 0.48 01/27/2023 0700   CREATININE 0.66 11/14/2019 1102   CALCIUM 8.2 (L) 01/27/2023 0700   EGFR 66 09/21/2021 1600   GFRNONAA >60 01/27/2023 0700    IMAGING past 24 hours No results found.  Vitals:   01/26/23 2023 01/26/23 2354 01/27/23 0355 01/27/23 0820  BP: (!) 156/72  121/76 124/79 128/77  Pulse: (!) 57 70 70 70  Resp: 18 18 18 16   Temp: 98 F (36.7 C) 98.6 F (37 C) 98 F (36.7 C) 98.2 F (36.8 C)  TempSrc: Oral Oral Oral Oral  SpO2: (!) 79% 97% 95% 96%  Weight:      Height:         PHYSICAL EXAM General:  Awake and alert patient. Appears frail, thin. Psych:  Mood: "okay, considering where I am." Affect: congruent.   CV: Regular rate and rhythm on monitor. Edema of BLLE.  Respiratory: Breathing comfortably on room air at time of exam.  GI: Abdomen soft, non-tender.  NEURO:  Mental Status: Alert and Oriented to self, place, date, situation.  Speech/Language: Patient's voice was mildly hoarse and soft. She has some lingering discomfort from extubation.    Cranial Nerves:  II: PERRL. Visual fields full to hand-waving. 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 III, IV, VI: EOMI. Eyelids elevate symmetrically.  V: Sensation is intact to light touch and symmetrical to face.  VII: Face is symmetrical resting and smiling VIII: hearing intact to voice. IX, X: Palate elevates symmetrically. Phonation is hoarse and soft. UE:[id number] shrug 5/5. XII: tongue is midline without fasciculations. Motor: 5/5 strength to all muscle groups tested.  Tone: is normal and bulk is normal (for thin, elderly patient) Sensation- Intact to light touch bilaterally. Extinction absent to light touch to DSS.   Coordination: FTN intact bilaterally, HKS: no ataxia in BLE.No drift.  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 Will place transfer orders to move her to the progressive neuro floor (3 [facility]). Code Stroke 10/1 CT head - No acute finding. 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   CTA head & neck: Emergent large vessel  occlusion at the right M1 segment. Widespread atherosclerosis. High-grade atheromatous stenosis at the non dominant left vertebral origin. CTP 20 cc infarct core with extensive penumbra  S/p IR with complete resolution of M1/MCA blockage achieving complete recanalization (TICI3). MRA  Not eligible due to pacemaker lead being in [facility] per vendor ([employer]) CT Head repeat Beam hardening artifact at the level of the mid/anterior right temporal lobe. Within this limitation, appreciable acute infarct. No intracranial hemorrhage. 2D Echo: EF 60 to 65% LDL 61 HgbA1c 6.6 VTE prophylaxis - Lovenox 30 Q24 Pradaxa (dabigatran) once a day prior to admission, resumed home Pradaxa Continue home keppra 500 bid Therapy recommendations: CIR Disposition: Pending  History of stroke/seizure 05/2018 admitted for left-sided numbness weakness, status post TPA, MRI showed right frontal parietal infarct.  CTA head neck showed right P2, left VA, bilateral ICA siphon severe stenosis.  EF 60 to 65%.  Negative for DVT, TEE negative.  LDL 117, A1c 5.9.  Discharged with DAPT and Lipitor 40 30-day CardioNet monitoring showed A. fib patient started on Eliquis. 12/2019  Admitted for right cerebellar infarct.  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  EF 60 to 65%, LDL 81, A1c 5.9.  Eliquis changed to Pradaxa, continue Lipitor 80. 06/2020 admitted for focal seizure started on keppra. MRI negative for stroke. LDL 71 and A1C 6.4. continued on pradaxa 150 bid and keppra.  Followed with Mary Austin NP at [facility]  Atrial fibrillation, chronic  Home Meds: lopressor and pradaxa Taking once a day with pradaxa during the last 5-6 days with COVID and paxlovid Rate controlled continue pradaxa   Respiratory failure, post procedure COVID infection Recent COVID infection diagnosed on 9/25 Completed paxlovid course.  Pt still COVID+ on 10/1 re-test.   Asymptomatic now Airborne  isolation will be lifted tomorrow  Hypertension Home meds:  norvasc, cozaar Stable BP goal less than 180/105  long-term BP goal normotensive  Hyperlipidemia Home meds: atorvastatin 80 LDL 61, goal < 70 Resumed home liptior 80 Continue statin at discharge  Dysphagia Speech on board On dysphagia 3 and thin liquid Avoid aspiration  Other Stroke Risk Factors Advanced age  Other Active Problems BPPV Hypokalemia - K 3.3->3.7, supplement  Hospital day # 3  Mary Mart DNP, ACNPC-AG  [facility]  ATTENDING NOTE: I reviewed above note and agree with the assessment and plan. Pt was seen and examined.   Husband at the bedside. Pt is having breakfast. Doing well. She worked with PT and OT today. Continued on pradaxa and keppra as well as statin. Airborne isolation will be lifted tomorrow. Pending CIR likely on Sunday.   For detailed assessment and plan, please refer to above/below as I have made changes wherever appropriate.   Marvel Plan,  MD PhD Stroke Neurology 01/27/2023 4:32 PM       To contact Stroke Continuity provider, please refer to [email or web address]. After hours, contact General Neurology

## 2023-01-27 NOTE — Progress Notes (Signed)
Occupational Therapy Treatment Patient Details Name: Mary Mercado MRN: [id number] DOB: February 07, 1941 Today's Date: 01/27/2023   History of present illness Pt presented to the ED 10/1 with c/o L side weakness and slurred speech. She tested positive for covid 9/22. CTA revealed R M1 occlusion. She emergently underwent thrombectomy. PMH: hx of stroke x3, HLD, HTN, PAF, BPPV, s/p pacemaker   OT comments  Progressing steadily. Ambulated with RW and CGA to toilet, managed pericare with CGA.  Sat at sink for grooming with set up, bathing with up to min assist and dressing with up to CGA.       If plan is discharge home, recommend the following:  A little help with walking and/or transfers;A little help with bathing/dressing/bathroom;Assistance with cooking/housework;Assist for transportation;Help with stairs or ramp for entrance   Equipment Recommendations  None recommended by OT    Recommendations for Other Services      Precautions / Restrictions Precautions Precautions: Fall;Other (comment) Precaution Comments: covid Restrictions Weight Bearing Restrictions: No       Mobility Bed Mobility Overal bed mobility: Modified Independent             General bed mobility comments: no assist to get LEs up into bed    Transfers Overall transfer level: Needs assistance Equipment used: Rolling walker (2 wheels) Transfers: Sit to/from Stand Sit to Stand: Contact guard assist           General transfer comment: from bed and chair     Balance Overall balance assessment: Needs assistance   Sitting balance-Leahy Scale: Good     Standing balance support: No upper extremity supported Standing balance-Leahy Scale: Fair Standing balance comment: reliant on UE support for ambulation, fair in static standing at sink                           ADL either performed or assessed with clinical judgement   ADL Overall ADL's : Needs assistance/impaired      Grooming: Wash/dry hands;Wash/dry face;Brushing hair;Oral care;Sitting;Set up   Upper Body Bathing: Minimal assistance;Sitting Upper Body Bathing Details (indicate cue type and reason): washed pt's back Lower Body Bathing: Contact guard assist;Sit to/from stand   Upper Body Dressing : Set up;Sitting   Lower Body Dressing: Contact guard assist;Sit to/from stand   Toilet Transfer: Contact guard assist;Ambulation;Regular Toilet;Grab bars;Rolling walker (2 wheels)   Toileting- Clothing Manipulation and Hygiene: Minimal assistance;Sit to/from stand Toileting - Clothing Manipulation Details (indicate cue type and reason): assist for toilet paper out of dispenser     Functional mobility during ADLs: Contact guard assist;Rolling walker (2 wheels)      Extremity/Trunk Assessment              Vision       Perception     Praxis      Cognition Arousal: Alert Behavior During Therapy: WFL for tasks assessed/performed Overall Cognitive Status: Impaired/Different from baseline Area of Impairment: Problem solving                             Problem Solving: Slow processing, Decreased initiation General Comments: likely baseline        Exercises      Shoulder Instructions       General Comments      Pertinent Vitals/ Pain       Pain Assessment Pain Assessment: No/denies pain  Home Living  Prior Functioning/Environment              Frequency  Min 1X/week        Progress Toward Goals  OT Goals(current goals can now be found in the care plan section)  Progress towards OT goals: Progressing toward goals  Acute Rehab OT Goals OT Goal Formulation: With patient Time For Goal Achievement: 02/08/23 Potential to Achieve Goals: Good  Plan      Co-evaluation                 AM-PAC OT "6 Clicks" Daily Activity     Outcome Measure   Help from another person eating meals?:  None Help from another person taking care of personal grooming?: None Help from another person toileting, which includes using toliet, bedpan, or urinal?: A Little Help from another person bathing (including washing, rinsing, drying)?: A Little Help from another person to put on and taking off regular upper body clothing?: A Little Help from another person to put on and taking off regular lower body clothing?: A Little 6 Click Score: 20    End of Session Equipment Utilized During Treatment: Gait belt;Rolling walker (2 wheels)  OT Visit Diagnosis: Unsteadiness on feet (R26.81);Other abnormalities of gait and mobility (R26.89);Muscle weakness (generalized) (M62.81)   Activity Tolerance Patient tolerated treatment well   Patient Left in bed;with call bell/phone within reach;with family/visitor present;with bed alarm set   Nurse Communication          Time: 4098-1191 OT Time Calculation (min): 44 min  Charges: OT General Charges $OT Visit: 1 Visit OT Treatments $Self Care/Home Management : 38-52 mins  Berna Spare, OTR/L Acute Rehabilitation Services Office: [phone]  Evern Bio 01/27/2023, 11:06 AM

## 2023-01-28 DIAGNOSIS — I63511 Cerebral infarction due to unspecified occlusion or stenosis of right middle cerebral artery: Secondary | ICD-10-CM | POA: Diagnosis not present

## 2023-01-28 LAB — CBC
HCT: 39 % (ref 36.0–46.0)
Hemoglobin: 13 g/dL (ref 12.0–15.0)
MCH: 29.8 pg (ref 26.0–34.0)
MCHC: 33.3 g/dL (ref 30.0–36.0)
MCV: 89.4 fL (ref 80.0–100.0)
Platelets: 164 10*3/uL (ref 150–400)
RBC: 4.36 MIL/uL (ref 3.87–5.11)
RDW: 12.7 % (ref 11.5–15.5)
WBC: 9.6 10*3/uL (ref 4.0–10.5)
nRBC: 0 % (ref 0.0–0.2)

## 2023-01-28 LAB — BASIC METABOLIC PANEL
Anion gap: 13 (ref 5–15)
BUN: 15 mg/dL (ref 8–23)
CO2: 22 mmol/L (ref 22–32)
Calcium: 8.3 mg/dL — ABNORMAL LOW (ref 8.9–10.3)
Chloride: 101 mmol/L (ref 98–111)
Creatinine, Ser: 0.56 mg/dL (ref 0.44–1.00)
GFR, Estimated: >60 mL/min (ref >60)
Glucose, Bld: 103 mg/dL — ABNORMAL HIGH (ref 70–99)
Potassium: 3.3 mmol/L — ABNORMAL LOW (ref 3.5–5.1)
Sodium: 136 mmol/L (ref 135–145)

## 2023-01-28 LAB — GLUCOSE, CAPILLARY
Glucose-Capillary: 112 mg/dL — ABNORMAL HIGH (ref 70–99)
Glucose-Capillary: 196 mg/dL — ABNORMAL HIGH (ref 70–99)
Glucose-Capillary: 82 mg/dL (ref 70–99)
Glucose-Capillary: 126 mg/dL — ABNORMAL HIGH (ref 70–99)

## 2023-01-28 MED ORDER — POTASSIUM CHLORIDE CRYS ER 20 MEQ PO TBCR
40.0000 meq | EXTENDED_RELEASE_TABLET | ORAL | Status: AC
  Administered 2023-01-28 (×2): 40 meq via ORAL
  Filled 2023-01-28 (×2): qty 2

## 2023-01-28 NOTE — Progress Notes (Addendum)
STROKE TEAM PROGRESS NOTE  BRIEF HPI [name] is a 82 y.o. female with hx of stroke x3, HLD, HTN, PAF on Pradaxa who presented to emergency department 01/24/23 morning as code stroke. Left sided weakness, facial droop, and speech change were noted at admission. LKW: 2230, 01/23/23. LVO+. COVID + 9/22. Not given TNK.  Right M1 occlusion with large penumbra. Transferred to ICU, post IR. Now medically stable to advance to a progressive care floor.   SIGNIFICANT HOSPITAL EVENTS - 01/24/23. Admitted via ED, IR intervention. Extubated. - 01/25/23. Pt leaving ICU for progressive floor. - 01/26/23 starting pradaxa   INTERIM HISTORY/SUBJECTIVE Family at the bedside. Patient is sitting in the chair eating breakfast in no apparent distress No new neurological events overnight Neurological exam is stable and unchanged  K is 3.3 will replace today Plan for CIR on Sunday    OBJECTIVE  CBC    Component Value Date/Time   WBC 9.6 01/28/2023 0411   RBC 4.36 01/28/2023 0411   HGB 13.0 01/28/2023 0411   HGB 13.9 01/28/2021 1020   HCT 39.0 01/28/2023 0411   HCT 43.4 01/28/2021 1020   PLT 164 01/28/2023 0411   PLT 254 01/28/2021 1020   MCV 89.4 01/28/2023 0411   MCV 90 01/28/2021 1020   MCH 29.8 01/28/2023 0411   MCHC 33.3 01/28/2023 0411   RDW 12.7 01/28/2023 0411   RDW 12.5 01/28/2021 1020   LYMPHSABS 0.9 01/24/2023 2218   MONOABS 0.7 01/24/2023 2218   EOSABS 0.0 01/24/2023 2218   BASOSABS 0.0 01/24/2023 2218    BMET    Component Value Date/Time   NA 136 01/28/2023 0411   NA 143 09/21/2021 1600   K 3.3 (L) 01/28/2023 0411   CL 101 01/28/2023 0411   CO2 22 01/28/2023 0411   GLUCOSE 103 (H) 01/28/2023 0411   BUN 15 01/28/2023 0411   BUN 14 09/21/2021 1600   CREATININE 0.56 01/28/2023 0411   CREATININE 0.66 11/14/2019 1102   CALCIUM 8.3 (L) 01/28/2023 0411   EGFR 66 09/21/2021 1600   GFRNONAA >60 01/28/2023 0411    IMAGING past 24 hours No results  found.  Vitals:   01/27/23 1945 01/27/23 2317 01/28/23 0308 01/28/23 0838  BP: (!) 127/91 116/66 120/71 (!) 149/90  Pulse: 71 69 70 70  Resp: (!) 21   18  Temp: 98.4 F (36.9 C) 98.4 F (36.9 C) 98 F (36.7 C) 98 F (36.7 C)  TempSrc: Oral Axillary Axillary Oral  SpO2: 92% 96% 96% 98%  Weight:      Height:         PHYSICAL EXAM General:  Awake and alert patient. Appears frail, thin. Psych:  Mood: "okay, considering where I am." Affect: congruent.   CV: Regular rate and rhythm on monitor. Edema of BLLE.  Respiratory: Breathing comfortably on room air at time of exam.  GI: Abdomen soft, non-tender.  NEURO:  Mental Status: Alert and Oriented to self, place, date, situation.  Speech/Language: Patient's voice was mildly hoarse and soft. She has some lingering discomfort from extubation.    Cranial Nerves:  II: PERRL. Visual fields full to hand-waving. 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 III, IV, VI: EOMI. Eyelids elevate symmetrically.  V: Sensation is intact to light touch and symmetrical to face.  VII: Face is symmetrical resting and smiling VIII: hearing intact to voice. IX, X: Palate elevates symmetrically. Phonation is hoarse and soft. ZO:[id number] shrug 5/5. XII: tongue is midline without  fasciculations. Motor: 5/5 strength to all muscle groups tested.  Tone: is normal and bulk is normal (for thin, elderly patient) Sensation- Intact to light touch bilaterally. Extinction absent to light touch to DSS.   Coordination: FTN intact bilaterally, HKS: no ataxia in BLE.No drift.  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 Will place transfer orders to move her to the progressive neuro floor (3 [facility]). Code Stroke 10/1 CT head - No acute finding. Chronic right  MCA branch and right superior cerebellar infarcts.   CTA head & neck: Emergent large vessel occlusion at the right M1 segment. Widespread atherosclerosis. High-grade atheromatous stenosis at the non dominant left vertebral origin. CTP 20 cc infarct core with extensive penumbra  S/p IR with complete resolution of M1/MCA blockage achieving complete recanalization (TICI3). MRA  Not eligible due to pacemaker lead being in [facility] per vendor ([employer]) CT Head repeat Beam hardening artifact at the level of the mid/anterior right temporal lobe. Within this limitation, appreciable acute infarct. No intracranial hemorrhage. 2D Echo: EF 60 to 65% LDL 61 HgbA1c 6.6 VTE prophylaxis - Lovenox 30 Q24 Pradaxa (dabigatran) once a day prior to admission, resumed home Pradaxa Continue home keppra 500 bid Therapy recommendations: CIR Disposition: Pending  History of stroke/seizure 05/2018 admitted for left-sided numbness weakness, status post TPA, MRI showed right frontal parietal infarct.  CTA head neck showed right P2, left VA, bilateral ICA siphon severe stenosis.  EF 60 to 65%.  Negative for DVT, TEE negative.  LDL 117, A1c 5.9.  Discharged with DAPT and Lipitor 40 30-day CardioNet monitoring showed A. fib patient started on Eliquis. 12/2019  Admitted for right cerebellar infarct.  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  EF 60 to 65%, LDL 81, A1c 5.9.  Eliquis changed to Pradaxa, continue Lipitor 80. 06/2020 admitted for focal seizure started on keppra. MRI negative for stroke. LDL 71 and A1C 6.4. continued on pradaxa 150 bid and keppra.  Followed with [name] NP at [facility]  Atrial fibrillation, chronic  Home Meds: lopressor and pradaxa Taking once a day with pradaxa during the last 5-6 days with COVID and paxlovid Rate controlled continue pradaxa   Respiratory failure, post procedure COVID infection Recent COVID infection diagnosed on  9/25 Completed paxlovid course.  Pt still COVID+ on 10/1 re-test.   Asymptomatic now Airborne isolation will be lifted tomorrow  Hypertension Home meds:  norvasc, cozaar Stable BP goal less than 180/105  long-term BP goal normotensive  Hyperlipidemia Home meds: atorvastatin 80 LDL 61, goal < 70 Resumed home liptior 80 Continue statin at discharge  Dysphagia Speech on board On dysphagia 3 and thin liquid Avoid aspiration  Other Stroke Risk Factors Advanced age  Other Active Problems BPPV Hypokalemia - K 3.3->3.7->3.3, supplement  Hospital day # 4  Gevena Mart DNP, ACNPC-AG  [facility]  ATTENDING ATTESTATION:  82 year old with atrial fibrillation on Pradaxa.  On isolation for COVID.  Sitting up in chair eating breakfast.  Otherwise doing well.  Pending CIR placement  Dr. Viviann Spare evaluated pt independently, reviewed imaging, chart, labs. Discussed and formulated plan with the Resident/APP. Changes were made to the note where appropriate. Please see APP/resident note above for details.   Total 36 minutes spent on counseling  patient and coordinating care, writing notes and reviewing chart.   Mary Woodham,MD         To contact Stroke Continuity provider, please refer to [email or web address]. After hours, contact General Neurology

## 2023-01-28 NOTE — Plan of Care (Signed)
  Problem: Coping: Goal: Will verbalize positive feelings about self Outcome: Progressing Goal: Will identify appropriate support needs Outcome: Progressing   Problem: Health Behavior/Discharge Planning: Goal: Ability to manage health-related needs will improve Outcome: Progressing   Problem: Self-Care: Goal: Ability to participate in self-care as condition permits will improve Outcome: Progressing   Problem: Nutrition: Goal: Risk of aspiration will decrease Outcome: Progressing Goal: Dietary intake will improve Outcome: Progressing   Problem: Activity: Goal: Ability to return to baseline activity level will improve Outcome: Progressing   Problem: Activity: Goal: Ability to tolerate increased activity will improve Outcome: Progressing   Problem: Role Relationship: Goal: Method of communication will improve Outcome: Progressing   Problem: Activity: Goal: Risk for activity intolerance will decrease Outcome: Progressing

## 2023-01-29 DIAGNOSIS — I63511 Cerebral infarction due to unspecified occlusion or stenosis of right middle cerebral artery: Secondary | ICD-10-CM | POA: Diagnosis not present

## 2023-01-29 LAB — BASIC METABOLIC PANEL
Anion gap: 13 (ref 5–15)
BUN: 12 mg/dL (ref 8–23)
CO2: 24 mmol/L (ref 22–32)
Calcium: 8.9 mg/dL (ref 8.9–10.3)
Chloride: 100 mmol/L (ref 98–111)
Creatinine, Ser: 0.48 mg/dL (ref 0.44–1.00)
GFR, Estimated: >60 mL/min (ref >60)
Glucose, Bld: 110 mg/dL — ABNORMAL HIGH (ref 70–99)
Potassium: 4.3 mmol/L (ref 3.5–5.1)
Sodium: 137 mmol/L (ref 135–145)

## 2023-01-29 LAB — CBC
HCT: 43.6 % (ref 36.0–46.0)
Hemoglobin: 14.4 g/dL (ref 12.0–15.0)
MCH: 30.3 pg (ref 26.0–34.0)
MCHC: 33 g/dL (ref 30.0–36.0)
MCV: 91.8 fL (ref 80.0–100.0)
Platelets: 242 10*3/uL (ref 150–400)
RBC: 4.75 MIL/uL (ref 3.87–5.11)
RDW: 12.9 % (ref 11.5–15.5)
WBC: 7.6 10*3/uL (ref 4.0–10.5)
nRBC: 0 % (ref 0.0–0.2)

## 2023-01-29 LAB — GLUCOSE, CAPILLARY
Glucose-Capillary: 136 mg/dL — ABNORMAL HIGH (ref 70–99)
Glucose-Capillary: 98 mg/dL (ref 70–99)
Glucose-Capillary: 110 mg/dL — ABNORMAL HIGH (ref 70–99)
Glucose-Capillary: 116 mg/dL — ABNORMAL HIGH (ref 70–99)

## 2023-01-29 NOTE — Progress Notes (Signed)
Inpatient Rehab Admissions Coordinator:  Awaiting bed availability in CIR. Will continue to follow.   Wolfgang Phoenix, MS, CCC-SLP Admissions Coordinator [phone]

## 2023-01-29 NOTE — Progress Notes (Addendum)
STROKE TEAM PROGRESS NOTE  BRIEF HPI [name] is a 82 y.o. female with hx of stroke x3, HLD, HTN, PAF on Pradaxa who presented to emergency department 01/24/23 morning as code stroke. Left sided weakness, facial droop, and speech change were noted at admission. LKW: 2230, 01/23/23. LVO+. COVID + 9/22. Not given TNK.  Right M1 occlusion with large penumbra. Transferred to ICU, post IR. Now medically stable to advance to a progressive care floor.   SIGNIFICANT HOSPITAL EVENTS - 01/24/23. Admitted via ED, IR intervention. Extubated. - 01/25/23. Pt leaving ICU for progressive floor. - 01/26/23 starting pradaxa   INTERIM HISTORY/SUBJECTIVE Family at the bedside. Patient is sitting in the chair eating breakfast in no apparent distress No new neurological events overnight Neurological exam is stable and unchanged  No bed available on rehab hopefully tomorrow   OBJECTIVE  CBC    Component Value Date/Time   WBC 9.6 01/28/2023 0411   RBC 4.36 01/28/2023 0411   HGB 13.0 01/28/2023 0411   HGB 13.9 01/28/2021 1020   HCT 39.0 01/28/2023 0411   HCT 43.4 01/28/2021 1020   PLT 164 01/28/2023 0411   PLT 254 01/28/2021 1020   MCV 89.4 01/28/2023 0411   MCV 90 01/28/2021 1020   MCH 29.8 01/28/2023 0411   MCHC 33.3 01/28/2023 0411   RDW 12.7 01/28/2023 0411   RDW 12.5 01/28/2021 1020   LYMPHSABS 0.9 01/24/2023 2218   MONOABS 0.7 01/24/2023 2218   EOSABS 0.0 01/24/2023 2218   BASOSABS 0.0 01/24/2023 2218    BMET    Component Value Date/Time   NA 136 01/28/2023 0411   NA 143 09/21/2021 1600   K 3.3 (L) 01/28/2023 0411   CL 101 01/28/2023 0411   CO2 22 01/28/2023 0411   GLUCOSE 103 (H) 01/28/2023 0411   BUN 15 01/28/2023 0411   BUN 14 09/21/2021 1600   CREATININE 0.56 01/28/2023 0411   CREATININE 0.66 11/14/2019 1102   CALCIUM 8.3 (L) 01/28/2023 0411   EGFR 66 09/21/2021 1600   GFRNONAA >60 01/28/2023 0411    IMAGING past 24 hours No results found.  Vitals:    01/28/23 2020 01/28/23 2309 01/29/23 0310 01/29/23 0918  BP: 125/78 123/75 126/74 (!) 154/84  Pulse: 71 71 83 68  Resp: 17  18 19   Temp: 98.2 F (36.8 C) 98 F (36.7 C) 97.8 F (36.6 C) 98 F (36.7 C)  TempSrc: Oral Oral Oral Oral  SpO2: 100% 100% 99% 100%  Weight:      Height:         PHYSICAL EXAM General:  Awake and alert patient. Appears frail, thin. Psych:  Mood: "okay, considering where I am." Affect: congruent.   CV: Regular rate and rhythm on monitor. Edema of BLLE.  Respiratory: Breathing comfortably on room air at time of exam.  GI: Abdomen soft, non-tender.  NEURO:  Mental Status: Alert and Oriented to self, place, date, situation.  Speech/Language: Patient's voice was mildly hoarse and soft. She has some lingering discomfort from extubation.    Cranial Nerves:  II: PERRL. Visual fields full to hand-waving. 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 III, IV, VI: EOMI. Eyelids elevate symmetrically.  V: Sensation is intact to light touch and symmetrical to face.  VII: Face is symmetrical resting and smiling VIII: hearing intact to voice. IX, X: Palate elevates symmetrically. Phonation is hoarse and soft. ZO:XWRUEAVW shrug 5/5. XII: tongue is midline without fasciculations. Motor: 5/5 strength to all muscle  groups tested.  Tone: is normal and bulk is normal (for thin, elderly patient) Sensation- Intact to light touch bilaterally. Extinction absent to light touch to DSS.   Coordination: FTN intact bilaterally, HKS: no ataxia in BLE.No drift.  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 Will place transfer orders to move her to the progressive neuro floor (3 [facility]). Code Stroke 10/1 CT head - No acute finding. 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   CTA head & neck: Emergent large vessel occlusion at the right M1 segment. Widespread atherosclerosis. High-grade atheromatous stenosis at the non dominant left vertebral origin. CTP 20 cc infarct core with extensive penumbra  S/p IR with complete resolution of M1/MCA blockage achieving complete recanalization (TICI3). MRA  Not eligible due to pacemaker lead being in [facility] per vendor ([employer]) CT Head repeat Beam hardening artifact at the level of the mid/anterior right temporal lobe. Within this limitation, appreciable acute infarct. No intracranial hemorrhage. 2D Echo: EF 60 to 65% LDL 61 HgbA1c 6.6 VTE prophylaxis - Lovenox 30 Q24 Pradaxa (dabigatran) once a day prior to admission, resumed home Pradaxa Continue home keppra 500 bid Therapy recommendations: CIR Disposition: Pending  History of stroke/seizure 05/2018 admitted for left-sided numbness weakness, status post TPA, MRI showed right frontal parietal infarct.  CTA head neck showed right P2, left VA, bilateral ICA siphon severe stenosis.  EF 60 to 65%.  Negative for DVT, TEE negative.  LDL 117, A1c 5.9.  Discharged with DAPT and Lipitor 40 30-day CardioNet monitoring showed A. fib patient started on Eliquis. 12/2019  Admitted for right cerebellar infarct.  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  EF 60 to 65%, LDL 81, A1c 5.9.  Eliquis changed to Pradaxa, continue Lipitor 80. 06/2020 admitted for focal seizure started on keppra. MRI negative for stroke. LDL 71 and A1C 6.4. continued on pradaxa 150 bid and keppra.  Followed with [name] NP at [facility]  Atrial fibrillation, chronic  Home Meds: lopressor and pradaxa Taking once a day with pradaxa during the last 5-6 days with COVID and paxlovid Rate controlled continue pradaxa   Respiratory failure, post procedure COVID infection Recent COVID infection diagnosed on 9/25 Completed paxlovid course.  Pt still  COVID+ on 10/1 re-test.   Asymptomatic now Airborne isolation will be lifted tomorrow  Hypertension Home meds:  norvasc, cozaar Stable BP goal less than 180/105  long-term BP goal normotensive  Hyperlipidemia Home meds: atorvastatin 80 LDL 61, goal < 70 Resumed home liptior 80 Continue statin at discharge  Dysphagia Speech on board On dysphagia 3 and thin liquid Avoid aspiration  Other Stroke Risk Factors Advanced age  Other Active Problems BPPV Hypokalemia - K 3.3->3.7->3.3, supplement  Hospital day # 5  Gevena Mart DNP, ACNPC-AG  [facility]  ATTENDING ATTESTATION:   82 year old with atrial fibrillation on Pradaxa.  Isolation stopped.   Sitting up in chair with two family members with her. Otherwise doing well.  Pending CIR placement.   Dr. Viviann Spare evaluated pt independently, reviewed imaging, chart, labs. Discussed and formulated plan with the Resident/APP. Changes were made to the note where appropriate. Please see APP/resident note above for details.   Total 25 minutes spent on counseling patient and coordinating  care, writing notes and reviewing chart.     Mary Rominger,MD        To contact Stroke Continuity provider, please refer to [email or web address]. After hours, contact General Neurology

## 2023-01-29 NOTE — Plan of Care (Signed)
  Problem: Coping: Goal: Will verbalize positive feelings about self Outcome: Progressing   Problem: Activity: Goal: Ability to return to baseline activity level will improve Outcome: Progressing   Problem: Clinical Measurements: Goal: Will remain free from infection Outcome: Progressing   Problem: Nutrition: Goal: Adequate nutrition will be maintained Outcome: Progressing   Problem: Elimination: Goal: Will not experience complications related to bowel motility Outcome: Progressing Goal: Will not experience complications related to urinary retention Outcome: Progressing

## 2023-01-30 ENCOUNTER — Inpatient Hospital Stay (HOSPITAL_COMMUNITY)
Admission: AD | Admit: 2023-01-30 | Discharge: 2023-02-07 | DRG: 057 | Disposition: A | Discharge status: Home Health | Payer: Medicare Other | Source: Intra-hospital | Attending: Physical Medicine and Rehabilitation | Admitting: Physical Medicine and Rehabilitation

## 2023-01-30 ENCOUNTER — Other Ambulatory Visit: Payer: Self-pay

## 2023-01-30 ENCOUNTER — Encounter (HOSPITAL_COMMUNITY): Payer: Self-pay | Admitting: Physical Medicine and Rehabilitation

## 2023-01-30 DIAGNOSIS — F4322 Adjustment disorder with anxiety: Secondary | ICD-10-CM | POA: Diagnosis present

## 2023-01-30 DIAGNOSIS — Z79899 Other long term (current) drug therapy: Secondary | ICD-10-CM | POA: Diagnosis not present

## 2023-01-30 DIAGNOSIS — Z950 Presence of cardiac pacemaker: Secondary | ICD-10-CM

## 2023-01-30 DIAGNOSIS — I480 Paroxysmal atrial fibrillation: Secondary | ICD-10-CM | POA: Diagnosis present

## 2023-01-30 DIAGNOSIS — G40909 Epilepsy, unspecified, not intractable, without status epilepticus: Secondary | ICD-10-CM | POA: Diagnosis present

## 2023-01-30 DIAGNOSIS — Z886 Allergy status to analgesic agent status: Secondary | ICD-10-CM | POA: Diagnosis not present

## 2023-01-30 DIAGNOSIS — I10 Essential (primary) hypertension: Secondary | ICD-10-CM | POA: Diagnosis not present

## 2023-01-30 DIAGNOSIS — I69318 Other symptoms and signs involving cognitive functions following cerebral infarction: Secondary | ICD-10-CM | POA: Diagnosis not present

## 2023-01-30 DIAGNOSIS — R0789 Other chest pain: Secondary | ICD-10-CM | POA: Diagnosis not present

## 2023-01-30 DIAGNOSIS — R1310 Dysphagia, unspecified: Secondary | ICD-10-CM | POA: Diagnosis present

## 2023-01-30 DIAGNOSIS — I63511 Cerebral infarction due to unspecified occlusion or stenosis of right middle cerebral artery: Principal | ICD-10-CM | POA: Diagnosis present

## 2023-01-30 DIAGNOSIS — Z8249 Family history of ischemic heart disease and other diseases of the circulatory system: Secondary | ICD-10-CM | POA: Diagnosis not present

## 2023-01-30 DIAGNOSIS — Z800 Family history of malignant neoplasm of digestive organs: Secondary | ICD-10-CM

## 2023-01-30 DIAGNOSIS — Z888 Allergy status to other drugs, medicaments and biological substances status: Secondary | ICD-10-CM | POA: Diagnosis not present

## 2023-01-30 DIAGNOSIS — Z7902 Long term (current) use of antithrombotics/antiplatelets: Secondary | ICD-10-CM | POA: Diagnosis not present

## 2023-01-30 DIAGNOSIS — Z8616 Personal history of COVID-19: Secondary | ICD-10-CM

## 2023-01-30 DIAGNOSIS — H409 Unspecified glaucoma: Secondary | ICD-10-CM | POA: Diagnosis present

## 2023-01-30 DIAGNOSIS — Z833 Family history of diabetes mellitus: Secondary | ICD-10-CM | POA: Diagnosis not present

## 2023-01-30 DIAGNOSIS — I69354 Hemiplegia and hemiparesis following cerebral infarction affecting left non-dominant side: Principal | ICD-10-CM

## 2023-01-30 DIAGNOSIS — F4320 Adjustment disorder, unspecified: Secondary | ICD-10-CM

## 2023-01-30 DIAGNOSIS — R600 Localized edema: Secondary | ICD-10-CM | POA: Diagnosis not present

## 2023-01-30 DIAGNOSIS — E785 Hyperlipidemia, unspecified: Secondary | ICD-10-CM | POA: Diagnosis present

## 2023-01-30 DIAGNOSIS — Z8669 Personal history of other diseases of the nervous system and sense organs: Secondary | ICD-10-CM

## 2023-01-30 DIAGNOSIS — E876 Hypokalemia: Secondary | ICD-10-CM | POA: Diagnosis not present

## 2023-01-30 DIAGNOSIS — I4891 Unspecified atrial fibrillation: Secondary | ICD-10-CM | POA: Diagnosis not present

## 2023-01-30 DIAGNOSIS — R454 Irritability and anger: Secondary | ICD-10-CM | POA: Diagnosis not present

## 2023-01-30 DIAGNOSIS — I69391 Dysphagia following cerebral infarction: Secondary | ICD-10-CM | POA: Diagnosis not present

## 2023-01-30 LAB — GLUCOSE, CAPILLARY
Glucose-Capillary: 97 mg/dL (ref 70–99)
Glucose-Capillary: 119 mg/dL — ABNORMAL HIGH (ref 70–99)

## 2023-01-30 MED ORDER — LATANOPROST 0.005 % OP SOLN
1.0000 [drp] | Freq: Every day | OPHTHALMIC | Status: DC
  Administered 2023-01-30 – 2023-02-06 (×8): 1 [drp] via OPHTHALMIC
  Filled 2023-01-30: qty 2.5

## 2023-01-30 MED ORDER — FAMOTIDINE 20 MG PO TABS
20.0000 mg | ORAL_TABLET | Freq: Every day | ORAL | Status: DC
  Administered 2023-01-31 – 2023-02-07 (×8): 20 mg via ORAL
  Filled 2023-01-30 (×8): qty 1

## 2023-01-30 MED ORDER — SENNOSIDES-DOCUSATE SODIUM 8.6-50 MG PO TABS: 1.0000 | ORAL_TABLET | Freq: Every evening | ORAL | Status: DC | PRN

## 2023-01-30 MED ORDER — ORAL CARE MOUTH RINSE: 15.0000 mL | OROMUCOSAL | Status: DC | PRN

## 2023-01-30 MED ORDER — LEVETIRACETAM 500 MG PO TABS
500.0000 mg | ORAL_TABLET | Freq: Two times a day (BID) | ORAL | Status: DC
  Administered 2023-01-30 – 2023-02-07 (×16): 500 mg via ORAL
  Filled 2023-01-30 (×16): qty 1

## 2023-01-30 MED ORDER — LATANOPROST 0.005 % OP SOLN: 1.0000 [drp] | Freq: Every day | OPHTHALMIC | Status: DC

## 2023-01-30 MED ORDER — DOCUSATE SODIUM 100 MG PO CAPS: 100.0000 mg | ORAL_CAPSULE | Freq: Two times a day (BID) | ORAL | Status: DC | PRN

## 2023-01-30 MED ORDER — ORAL CARE MOUTH RINSE
15.0000 mL | OROMUCOSAL | Status: DC
  Administered 2023-01-30 (×2): 15 mL via OROMUCOSAL

## 2023-01-30 MED ORDER — POLYETHYLENE GLYCOL 3350 17 G PO PACK: 17.0000 g | PACK | Freq: Every day | ORAL | Status: DC | PRN

## 2023-01-30 MED ORDER — ATORVASTATIN CALCIUM 80 MG PO TABS
80.0000 mg | ORAL_TABLET | Freq: Every day | ORAL | Status: DC
  Administered 2023-01-31 – 2023-02-07 (×8): 80 mg via ORAL
  Filled 2023-01-30 (×8): qty 1

## 2023-01-30 MED ORDER — DABIGATRAN ETEXILATE MESYLATE 150 MG PO CAPS
150.0000 mg | ORAL_CAPSULE | Freq: Two times a day (BID) | ORAL | Status: DC
  Administered 2023-01-30 – 2023-02-07 (×16): 150 mg via ORAL
  Filled 2023-01-30 (×16): qty 1

## 2023-01-30 MED ORDER — ATORVASTATIN CALCIUM 80 MG PO TABS: 80.0000 mg | ORAL_TABLET | Freq: Every day | ORAL | Status: DC

## 2023-01-30 MED ORDER — DORZOLAMIDE HCL-TIMOLOL MAL 2-0.5 % OP SOLN
1.0000 [drp] | Freq: Two times a day (BID) | OPHTHALMIC | Status: DC
  Administered 2023-01-30 – 2023-02-07 (×16): 1 [drp] via OPHTHALMIC
  Filled 2023-01-30: qty 10

## 2023-01-30 MED ORDER — DABIGATRAN ETEXILATE MESYLATE 150 MG PO CAPS: 150.0000 mg | ORAL_CAPSULE | Freq: Two times a day (BID) | ORAL | Status: DC

## 2023-01-30 NOTE — Discharge Instructions (Addendum)
Inpatient Rehab Discharge Instructions  Demani Weyrauch [facility] Discharge date and time: 02/07/2023   Activities/Precautions/ Functional Status: Activity: activity as tolerated Diet: regular diet Wound Care: routine skin checks Functional status:  ___ No restrictions     ___ Walk up steps independently _x__ 24/7 supervision/assistance   ___ Walk up steps with assistance ___ Intermittent supervision/assistance  ___ Bathe/dress independently ___ Walk with walker     __x_ Bathe/dress with assistance ___ Walk Independently    ___ Shower independently ___ Walk with assistance    __x_ Shower with assistance _x__ No alcohol     ___ Return to work/school ________  COMMUNITY REFERRALS UPON DISCHARGE:    Outpatient: PT     OT    ST               Agency:[facility]  [phone]               Appointment Date/Time: TBD   Special Instructions: No driving, alcohol consumption or tobacco use.  Recommend daily BP measurement in same arm and record time of day. Bring this information with you to follow-up appointment with PCP.  STROKE/TIA DISCHARGE INSTRUCTIONS SMOKING Cigarette smoking nearly doubles your risk of having a stroke & is the single most alterable risk factor  If you smoke or have smoked in the last 12 months, you are advised to quit smoking for your health. Most of the excess cardiovascular risk related to smoking disappears within a year of stopping. Ask you doctor about anti-smoking medications [place] Quit Line: 1-800-QUIT NOW Free Smoking Cessation Classes [phone]  CHOLESTEROL Know your levels; limit fat & cholesterol in your diet  Lipid Panel     Component Value Date/Time   CHOL 153 01/25/2023 0818   CHOL 161 09/21/2021 1600   TRIG 125 01/25/2023 0819   HDL 51 01/25/2023 0818   HDL 84 09/21/2021 1600   CHOLHDL 3.0 01/25/2023 0818   VLDL 18 01/25/2023 0818   LDLCALC 84 01/25/2023 0818   LDLCALC 67 09/21/2021 1600   LDLCALC 90 11/14/2019 1102     Many  patients benefit from treatment even if their cholesterol is at goal. Goal: Total Cholesterol (CHOL) less than 160 Goal:  Triglycerides (TRIG) less than 150 Goal:  HDL greater than 40 Goal:  LDL (LDLCALC) less than 100   BLOOD PRESSURE American Stroke Association blood pressure target is less that 120/80 mm/Hg  Your discharge blood pressure is:    Monitor your blood pressure Limit your salt and alcohol intake Many individuals will require more than one medication for high blood pressure  DIABETES (A1c is a blood sugar average for last 3 months) Goal HGBA1c is under 7% (HBGA1c is blood sugar average for last 3 months)  Diabetes: No known diagnosis of diabetes    Lab Results  Component Value Date   HGBA1C 6.6 (H) 01/25/2023    Your HGBA1c can be lowered with medications, healthy diet, and exercise. Check your blood sugar as directed by your physician Call your physician if you experience unexplained or low blood sugars.  PHYSICAL ACTIVITY/REHABILITATION Goal is 30 minutes at least 4 days per week  Activity: Increase activity slowly, Therapies: Physical Therapy: Outpatient, Occupational Therapy: Outpatient, and Speech Therapy: Outpatient Return to work: n/a Activity decreases your risk of heart attack and stroke and makes your heart stronger.  It helps control your weight and blood pressure; helps you relax and can improve your mood. Participate in a regular exercise program. Talk with your doctor about  the best form of exercise for you (dancing, walking, swimming, cycling).  DIET/WEIGHT Goal is to maintain a healthy weight  Your discharge diet is:  Diet Order     None       liquids Your height is:    Your current weight is:   Your Body Mass Index (BMI) is:    Following the type of diet specifically designed for you will help prevent another stroke. Your goal weight range is:   Your goal Body Mass Index (BMI) is 19-24. Healthy food habits can help reduce 3 risk factors for  stroke:  High cholesterol, hypertension, and excess weight.  RESOURCES Stroke/Support Group:  Call [phone]   STROKE EDUCATION PROVIDED/REVIEWED AND GIVEN TO PATIENT Stroke warning signs and symptoms How to activate emergency medical system (call 911). Medications prescribed at discharge. Need for follow-up after discharge. Personal risk factors for stroke. Pneumonia vaccine given: No Flu vaccine given: No My questions have been answered, the writing is legible, and I understand these instructions.  I will adhere to these goals & educational materials that have been provided to me after my discharge from the hospital.   COMMUNITY REFERRALS UPON DISCHARGE:    Home Health:   PT      OT      ST                 Agency:  Phone:     My questions have been answered and I understand these instructions. I will adhere to these goals and the provided educational materials after my discharge from the hospital.  Patient/Caregiver Signature _______________________________ Date __________  Clinician Signature _______________________________________ Date __________  Please bring this form and your medication list with you to all your follow-up doctor's appointments.

## 2023-01-30 NOTE — Progress Notes (Signed)
Occupational Therapy Treatment Patient Details Name: Mary Mercado MRN: [id number] DOB: 1940-06-05 Today's Date: 01/30/2023   History of present illness Pt presented to the ED 10/1 with c/o L side weakness and slurred speech. She tested positive for covid 9/22. CTA revealed R M1 occlusion. She emergently underwent thrombectomy. PMH: hx of stroke x3, HLD, HTN, PAF, BPPV, s/p pacemaker   OT comments  Pt making good progress with functional goals. Pt eager to d/c to AIR. Pt requires CGA - Sup sit - stand transitions, CGA/SUp using RW for functional mobility to walk to bathroom to toilet and to stand at sink for grooming tasks. OT will continue to follow acutely to maximize level of function and safety.      If plan is discharge home, recommend the following:  A little help with walking and/or transfers;A little help with bathing/dressing/bathroom;Assistance with cooking/housework;Assist for transportation;Help with stairs or ramp for entrance   Equipment Recommendations  None recommended by OT    Recommendations for Other Services      Precautions / Restrictions Precautions Precautions: Fall;Other (comment) Precaution Comments: covid Restrictions Weight Bearing Restrictions: No       Mobility Bed Mobility               General bed mobility comments: in recliner    Transfers Overall transfer level: Needs assistance Equipment used: Rolling walker (2 wheels) Transfers: Sit to/from Stand Sit to Stand: Contact guard assist, Supervision                 Balance Overall balance assessment: Needs assistance Sitting-balance support: Feet supported, No upper extremity supported Sitting balance-Leahy Scale: Good Sitting balance - Comments: sits EOB and toilet without UE support supervision level   Standing balance support: No upper extremity supported Standing balance-Leahy Scale: Fair                             ADL either performed or assessed  with clinical judgement   ADL Overall ADL's : Needs assistance/impaired     Grooming: Wash/dry hands;Wash/dry face;Brushing hair;Standing;Contact guard assist                   Toilet Transfer: Contact guard assist;Ambulation;Regular Toilet;Grab bars;Rolling walker (2 wheels)   Toileting- Clothing Manipulation and Hygiene: Contact guard assist       Functional mobility during ADLs: Contact guard assist;Rolling walker (2 wheels)      Extremity/Trunk Assessment Upper Extremity Assessment Upper Extremity Assessment: Generalized weakness;Right hand dominant LUE Deficits / Details: 4/5 strength, had residual fine motor deficits from previous strokes LUE Coordination: decreased fine motor   Lower Extremity Assessment Lower Extremity Assessment: Defer to PT evaluation   Cervical / Trunk Assessment Cervical / Trunk Assessment: Kyphotic    Vision Baseline Vision/History: 3 Glaucoma Ability to See in Adequate Light: 1 Impaired     Perception     Praxis      Cognition Arousal: Alert Behavior During Therapy: WFL for tasks assessed/performed Overall Cognitive Status: Impaired/Different from baseline Area of Impairment: Problem solving, Safety/judgement                             Problem Solving: Slow processing, Requires verbal cues          Exercises      Shoulder Instructions       General Comments HR in 70s    Pertinent Vitals/ Pain  Pain Assessment Pain Assessment: No/denies pain  Home Living Family/patient expects to be discharged to:: Private residence Living Arrangements: Spouse/significant other                                      Prior Functioning/Environment              Frequency  Min 1X/week        Progress Toward Goals  OT Goals(current goals can now be found in the care plan section)  Progress towards OT goals: Progressing toward goals     Plan      Co-evaluation                  AM-PAC OT "6 Clicks" Daily Activity     Outcome Measure   Help from another person eating meals?: None Help from another person taking care of personal grooming?: None Help from another person toileting, which includes using toliet, bedpan, or urinal?: A Little Help from another person bathing (including washing, rinsing, drying)?: A Little Help from another person to put on and taking off regular upper body clothing?: A Little   6 Click Score: 17    End of Session Equipment Utilized During Treatment: Gait belt;Rolling walker (2 wheels)  OT Visit Diagnosis: Unsteadiness on feet (R26.81);Other abnormalities of gait and mobility (R26.89);Muscle weakness (generalized) (M62.81)   Activity Tolerance Patient tolerated treatment well   Patient Left with call bell/phone within reach;with family/visitor present;in chair   Nurse Communication          Time: 1610-9604 OT Time Calculation (min): 16 min  Charges: OT General Charges $OT Visit: 1 Visit OT Treatments $Self Care/Home Management : 8-22 mins $Therapeutic Activity: 8-22 mins    Galen Manila 01/30/2023, 1:56 PM

## 2023-01-30 NOTE — Progress Notes (Signed)
Signed     Expand All Collapse All PMR Admission Coordinator Pre-Admission Assessment   Patient: Mary Mercado is an 82 y.o., female MRN: [id number] DOB: Jul 26, 1940 Height: 5\' 5"  (165.1 cm) Weight: 47 kg   Insurance Information HMO:     PPO:      PCP:      IPA:      80/20:      OTHER:  PRIMARY: Medicare a and b      Policy#: [id number]      Subscriber: pt Benefits:  Phone #: passport one source     Name: 10/3 Eff. Date: 01/23/2005     Deduct: $1632      Out of Pocket Max: none      Life Max: none CIR: 100%      SNF: 20 full days Outpatient: 80%     Co-Pay: 20% Home Health: 100%      Co-Pay: none DME: 80%     Co-Pay: 20% Providers: in network  SECONDARY:   BCBS [place] supplement    Policy#: [id number] 10/4 I requested policy to be added to acute chart as well as CIR chart in admitting   Financial Counselor:         The "Data Collection Information Summary" for patients in Inpatient Rehabilitation Facilities with attached "Privacy Act Statement-Health Care Records" was provided and verbally reviewed with: Patient and Family   Emergency Contact Information Contact Information       Name Relation Home Work Mobile    [name],[name] Spouse [phone]   [phone]         Other Contacts   None on File      Current Medical History  Patient Admitting Diagnosis: CVA   History of Present Illness: 82 year old right-handed female with history of hyperlipidemia, hypertension, 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    Presented 01/24/2023 with acute onset of left-sided weakness and slurred speech.  Cranial CT scan negative for acute findings.  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  Underwent emergent thrombectomy  01/24/2023 per interventional radiology.  Echocardiogram with ejection fraction of 60 to 65% no wall motion abnormalities.  Follow-up CT showed beam hardening artifact at the level of the mid/anterior right temporal lobe no intracranial hemorrhage.  In regards to patient recent COVID infection diagnosed 9/25/ 2024 completed Paxlovid course.  Follow-up testing 10/1 positive patient asymptomatic currently on airborne contact precautions..  She is currently maintained on Pradaxa for CVA prophylaxis.      Complete NIHSS TOTAL: 4   Patient's medical record from [facility] has been reviewed by the rehabilitation admission coordinator and physician.   Past Medical History      Past Medical History:  Diagnosis Date   Allergy     CVA (cerebral vascular accident) (HCC) 05/2018, 01/09/20   Hyperlipidemia LDL goal <70     Hypertension     Pacemaker     Palpitations     Vertigo, benign positional          Has the patient had major surgery during 100 days prior to admission? Yes   Family History   family history includes Colon cancer in her father; Diabetes in her mother; Heart attack in her mother; Heart attack (age of onset: 27) in her brother.  Current Medications  Current Medications    Current Facility-Administered Medications:    0.9 %  sodium chloride infusion, 250 mL, Intravenous, Continuous, Autry, Vaanya Shambaugh E, PA-C, Stopped at 01/24/23 1209   atorvastatin (LIPITOR) tablet 80 mg, 80 mg, Oral, Daily, Pham, Minh Q, RPH-CPP, 80 mg at 01/29/23 8295   Chlorhexidine Gluconate Cloth 2 % PADS 6 each, 6 each, Topical, Daily, Marvel Plan, MD, 6 each at 01/29/23 0918   dabigatran (PRADAXA) capsule 150 mg, 150 mg, Oral, Q12H, Carrington Clamp, RPH, 150 mg at 01/29/23 2137   docusate sodium (COLACE) capsule 100 mg, 100 mg, Oral, BID PRN, Pham, Minh Q, RPH-CPP   dorzolamide-timolol (COSOPT) 2-0.5 % ophthalmic solution 1 drop, 1 drop, Both Eyes, BID, Autry, Cale Bethard E, PA-C, 1 drop at 01/29/23  2137   famotidine (PEPCID) tablet 20 mg, 20 mg, Oral, Daily, Pham, Minh Q, RPH-CPP, 20 mg at 01/29/23 0918   insulin aspart (novoLOG) injection 0-6 Units, 0-6 Units, Subcutaneous, TID WC, Marvel Plan, MD, 1 Units at 01/28/23 1230   latanoprost (XALATAN) 0.005 % ophthalmic solution 1 drop, 1 drop, Both Eyes, QHS, Autry, Jayleigh Notarianni E, PA-C, 1 drop at 01/29/23 2137   levETIRAcetam (KEPPRA) tablet 500 mg, 500 mg, Oral, BID, Pham, Minh Q, RPH-CPP, 500 mg at 01/29/23 2137   Oral care mouth rinse, 15 mL, Mouth Rinse, 4 times per day, Palikh, Jeri Lager, MD   Oral care mouth rinse, 15 mL, Mouth Rinse, PRN, Palikh, Jeri Lager, MD   polyethylene glycol (MIRALAX / GLYCOLAX) packet 17 g, 17 g, Per Tube, Daily PRN, Pia Mau D, PA-C   senna-docusate (Senokot-S) tablet 1 tablet, 1 tablet, Oral, QHS PRN, Pia Mau D, PA-C   Facility-Administered Medications Ordered in Other Encounters:    regadenoson (LEXISCAN) injection SOLN 0.4 mg, 0.4 mg, Intravenous, Once, Jodelle Red, MD   technetium tetrofosmin (TC-MYOVIEW) injection 32 millicurie, 32 millicurie, Intravenous, Once PRN, Jodelle Red, MD     Patients Current Diet:  Diet Order                  DIET DYS 3 Room service appropriate? Yes with Assist; Fluid consistency: Thin  Diet effective now                       Precautions / Restrictions Precautions Precautions: Fall, Other (comment) Precaution Comments: covid Restrictions Weight Bearing Restrictions: No    Has the patient had 2 or more falls or a fall with injury in the past year? No   Prior Activity Level Community (5-7x/wk): mod I with rollator   Prior Functional Level Self Care: Did the patient need help bathing, dressing, using the toilet or eating? Independent   Indoor Mobility: Did the patient need assistance with walking from room to room (with or without device)? Independent   Stairs: Did the patient need assistance with internal or external stairs (with or  without device)? Independent   Functional Cognition: Did the patient need help planning regular tasks such as shopping or remembering to take medications? Independent   Patient Information Are you of Hispanic, Latino/a,or Spanish origin?: A. No, not of Hispanic, Latino/a, or Spanish origin What is your race?: A. White Do you need or want an interpreter to communicate with a doctor or health care staff?: 0. No   Patient's Response To:  Health Literacy and Transportation Is the patient able to respond to health literacy and transportation needs?: Yes Health Literacy - How often do you need to have  someone help you when you read instructions, pamphlets, or other written material from your doctor or pharmacy?: Never In the past 12 months, has lack of transportation kept you from medical appointments or from getting medications?: No In the past 12 months, has lack of transportation kept you from meetings, work, or from getting things needed for daily living?: No   Home Assistive Devices / Equipment Home Equipment: Occupational hygienist (4 wheels), Shower seat - built in, [employer] - tub/shower, Grab bars - toilet, Hand held shower head   Prior Device Use: Indicate devices/aids used by the patient prior to current illness, exacerbation or injury? rollator   Current Functional Level Cognition   Overall Cognitive Status: Impaired/Different from baseline Orientation Level: Oriented X4 General Comments: likely baseline    Extremity Assessment (includes Sensation/Coordination)   Upper Extremity Assessment: Right hand dominant, LUE deficits/detail LUE Deficits / Details: 4/5 strength, had residual fine motor deficits from previous strokes LUE Sensation: decreased proprioception LUE Coordination: decreased fine motor  Lower Extremity Assessment: Defer to PT evaluation     ADLs   Overall ADL's : Needs assistance/impaired Eating/Feeding: Set up, Sitting Eating/Feeding Details (indicate cue type and  reason): using L UE to hold applesauce cup Grooming: Wash/dry hands, Wash/dry face, Brushing hair, Oral care, Sitting, Set up Upper Body Bathing: Minimal assistance, Sitting Upper Body Bathing Details (indicate cue type and reason): washed pt's back Lower Body Bathing: Contact guard assist, Sit to/from stand Upper Body Dressing : Set up, Sitting Lower Body Dressing: Contact guard assist, Sit to/from stand Lower Body Dressing Details (indicate cue type and reason): can don and doff socks Toilet Transfer: Contact guard assist, Ambulation, Regular Toilet, Grab bars, Rolling walker (2 wheels) Toileting- Clothing Manipulation and Hygiene: Minimal assistance, Sit to/from stand Toileting - Clothing Manipulation Details (indicate cue type and reason): assist for toilet paper out of dispenser Functional mobility during ADLs: Contact guard assist, Rolling walker (2 wheels)     Mobility   Overal bed mobility: Needs Assistance Bed Mobility: Supine to Sit Supine to sit: Supervision, Used rails, HOB elevated Sit to supine: Contact guard assist General bed mobility comments: Extra time, no physical assist but provided cues for technique with low HOB setting.     Transfers   Overall transfer level: Needs assistance Equipment used: Rolling walker (2 wheels) Transfers: Sit to/from Stand Sit to Stand: Contact guard assist Bed to/from chair/wheelchair/BSC transfer type:: Step pivot Step pivot transfers: Min assist General transfer comment: CGA for safety and balance from bed and toilet today. Slow to rise, cues for hand placement. Minor posterior lean noted.     Ambulation / Gait / Stairs / Wheelchair Mobility   Ambulation/Gait Ambulation/Gait assistance: Editor, commissioning (Feet): 75 Feet Assistive device: Rolling walker (2 wheels) Gait Pattern/deviations: Step-through pattern, Decreased stride length, Shuffle General Gait Details: Good posture but slow and intermittently shuffling steps,  especially in congested areas. min assist for RW control rarely, mostly at [facility] level with minor instability despite RW support. Cues for awareness, technique, and walker proximity with turns. Gait velocity: decreased Gait velocity interpretation: <1.31 ft/sec, indicative of household ambulator     Posture / Balance Dynamic Sitting Balance Sitting balance - Comments: sits EOB and toilet without UE support supervision level Balance Overall balance assessment: Needs assistance Sitting-balance support: Feet supported, No upper extremity supported Sitting balance-Leahy Scale: Good Sitting balance - Comments: sits EOB and toilet without UE support supervision level Standing balance support: No upper extremity supported Standing balance-Leahy Scale: Fair Standing  balance comment: reliant on UE support for ambulation, fair in static standing at sink     Special needs/care consideration Hb A1c 6.6 COVID + dx 01/18/23, completed Paxlovid course; 10/1 retest + COVID isolation to be discontinued on 10/5    Previous Home Environment  Living Arrangements: Spouse/significant other  Lives With: Spouse Available Help at Discharge: Family, Available 24 hours/day Type of Home: Independent living facility Home Layout: One level Home Access: Engineer, maintenance (IT) Shower/Tub: Health visitor: Handicapped height Bathroom Accessibility: Yes Home Care Services: No Additional Comments: Pt lives at [facility] Psychologist, clinical area)   Discharge Living Setting Plans for Discharge Living Setting: Patient's home, Other (Comment) (Independent living at [facility]) Type of Home at Discharge: Independent living facility Care Facility Name at Discharge: [facility] Discharge Home Layout: One level Discharge Home Access: Elevator Discharge Bathroom Shower/Tub: Walk-in shower Discharge Bathroom Toilet: Handicapped height Discharge Bathroom Accessibility: Yes How Accessible: Accessible via  walker Does the patient have any problems obtaining your medications?: No   Social/Family/Support Systems Patient Roles: Spouse Contact Information: spouse, Interior and spatial designer Anticipated Caregiver: Spouse Anticipated Caregiver's Contact Information: see contacts Ability/Limitations of Caregiver: no limitations Caregiver Availability: 24/7 Discharge Plan Discussed with Primary Caregiver: Yes Is Caregiver In Agreement with Plan?: Yes Does Caregiver/Family have Issues with Lodging/Transportation while Pt is in Rehab?: No   Goals Patient/Family Goal for Rehab: supervision with PT and OT Expected length of stay: ELOS 10 to 14 days Pt/Family Agrees to Admission and willing to participate: Yes Program Orientation Provided & Reviewed with Pt/Caregiver Including Roles  & Responsibilities: Yes   Decrease burden of Care through IP rehab admission: n/a   Possible need for SNF placement upon discharge: not anticipated, but has access to SNF at [facility] if needed   Patient Condition: I have reviewed medical records from [facility], spoken with patient and spouse. I met with patient at the bedside for inpatient rehabilitation assessment.  Patient will benefit from ongoing PT, OT, and SLP, can actively participate in 3 hours of therapy a day 5 days of the week, and can make measurable gains during the admission.  Patient will also benefit from the coordinated team approach during an Inpatient Acute Rehabilitation admission.  The patient will receive intensive therapy as well as Rehabilitation physician, nursing, social worker, and care management interventions.  Due to bladder management, bowel management, safety, skin/wound care, disease management, medication administration, pain management, and patient education the patient requires 24 hour a day rehabilitation nursing.  The patient is currently min assist overall with mobility and basic ADLs.  Discharge setting and therapy post discharge at home with  home health is anticipated.  Patient has agreed to participate in the Acute Inpatient Rehabilitation Program and will admit 01/30/23 when bed is available.   Preadmission Screen Completed By:  Clois Dupes RN MSN 01/30/2023 9:53 AM ______________________________________________________________________   Discussed status with Dr. Natale Lay on 01/30/23 at 9:53 AM and received approval for admission Monday 10/7 when bed is available.   Admission Coordinator:  Clois Dupes, RN MSN time 9:53 Date 01/30/23    Assessment/Plan: Diagnosis: R MCA CVA Does the need for close, 24 hr/day Medical supervision in concert with the patient's rehab needs make it unreasonable for this patient to be served in a less intensive setting? Yes Co-Morbidities requiring supervision/potential complications: Hx of seizure/CVA, Afib, HLD, HTN, Dsyphagia, BPPV, hypokalemia,  Due to bladder management, bowel management, safety, skin/wound care, disease management, medication administration, pain management, and patient  education, does the patient require 24 hr/day rehab nursing? Yes Does the patient require coordinated care of a physician, rehab nurse, PT, OT, and SLP to address physical and functional deficits in the context of the above medical diagnosis(es)? Yes Addressing deficits in the following areas: balance, endurance, locomotion, strength, transferring, bowel/bladder control, bathing, dressing, feeding, grooming, toileting, cognition, speech, language, swallowing, and psychosocial support Can the patient actively participate in an intensive therapy program of at least 3 hrs of therapy 5 days a week? Yes The potential for patient to make measurable gains while on inpatient rehab is excellent Anticipated functional outcomes upon discharge from inpatient rehab: supervision PT, supervision OT, supervision SLP Estimated rehab length of stay to reach the above functional goals is: 10-14 Anticipated  discharge destination: Home 10. Overall Rehab/Functional Prognosis: excellent     MD Signature: Fanny Dance

## 2023-01-30 NOTE — Progress Notes (Signed)
Physical Therapy Treatment Patient Details Name: Mary Mercado MRN: [id number] DOB: 12-21-1940 Today's Date: 01/30/2023   History of Present Illness Pt presented to the ED 10/1 with c/o L side weakness and slurred speech. She tested positive for covid 9/22. CTA revealed R M1 occlusion. She emergently underwent thrombectomy. PMH: hx of stroke x3, HLD, HTN, PAF, BPPV, s/p pacemaker    PT Comments  Continues to progress quickly towards acute functional goals. Increased dynamic challenges today. Ambulating at a CGA with mild difficulty ambulating in straight path while using RW, minor stumbling present with hand held support from therapist. Supervision for safety with transfers. Feels less dyspneic with gait but pt and husband both report pt still appears grossly weak compared to baseline. Patient will continue to benefit from skilled physical therapy services to further improve independence with functional mobility.    If plan is discharge home, recommend the following: A little help with walking and/or transfers;A little help with bathing/dressing/bathroom;Assistance with cooking/housework;Assist for transportation   Can travel by private vehicle        Equipment Recommendations  None recommended by PT    Recommendations for Other Services Rehab consult     Precautions / Restrictions Precautions Precautions: Fall;Other (comment) Precaution Comments: covid Restrictions Weight Bearing Restrictions: No     Mobility  Bed Mobility               General bed mobility comments: in recliner    Transfers Overall transfer level: Needs assistance Equipment used: Rolling walker (2 wheels) Transfers: Sit to/from Stand Sit to Stand: Supervision           General transfer comment: Supervision for safety to rise from recliner. Good recall of hand placement and to reach back when sitting.    Ambulation/Gait Ambulation/Gait assistance: Contact guard assist Gait  Distance (Feet): 100 Feet Assistive device: Rolling walker (2 wheels), None Gait Pattern/deviations: Step-through pattern, Decreased stride length, Shuffle, Drifts right/left Gait velocity: decreased Gait velocity interpretation: <1.31 ft/sec, indicative of household ambulator   General Gait Details: Challenged with dynamic tasks today including head nods and horiziontal turns, backwards steps and marching. CGA for safety, intermittent drifting with RW, cues for awareness (possible visual component.) Without AD, hand held support, minor stumbling. No dyspnea noted today.   Stairs             Wheelchair Mobility     Tilt Bed    Modified Rankin (Stroke Patients Only) Modified Rankin (Stroke Patients Only) Pre-Morbid Rankin Score: Slight disability Modified Rankin: Moderately severe disability     Balance Overall balance assessment: Needs assistance Sitting-balance support: Feet supported, No upper extremity supported Sitting balance-Leahy Scale: Good     Standing balance support: No upper extremity supported Standing balance-Leahy Scale: Fair Standing balance comment: Can stand without UE support, guarded, supervision for safety.                            Cognition Arousal: Alert Behavior During Therapy: WFL for tasks assessed/performed Overall Cognitive Status: Impaired/Different from baseline Area of Impairment: Problem solving                             Problem Solving: Slow processing, Requires verbal cues          Exercises General Exercises - Lower Extremity Long Arc Quad: Strengthening, Both, 10 reps, Seated    General Comments General comments (skin integrity, edema,  etc.): HR in 70s      Pertinent Vitals/Pain Pain Assessment Pain Assessment: No/denies pain    Home Living                          Prior Function            PT Goals (current goals can now be found in the care plan section) Acute Rehab PT  Goals Patient Stated Goal: rehab then home PT Goal Formulation: With patient Time For Goal Achievement: 02/08/23 Potential to Achieve Goals: Good Progress towards PT goals: Progressing toward goals    Frequency    Min 1X/week      PT Plan      Co-evaluation              AM-PAC PT "6 Clicks" Mobility   Outcome Measure  Help needed turning from your back to your side while in a flat bed without using bedrails?: A Little Help needed moving from lying on your back to sitting on the side of a flat bed without using bedrails?: A Little Help needed moving to and from a bed to a chair (including a wheelchair)?: A Little Help needed standing up from a chair using your arms (e.g., wheelchair or bedside chair)?: A Little Help needed to walk in hospital room?: A Little Help needed climbing 3-5 steps with a railing? : A Little 6 Click Score: 18    End of Session Equipment Utilized During Treatment: Gait belt Activity Tolerance: Patient tolerated treatment well Patient left: with call bell/phone within reach;in chair;with chair alarm set;with SCD's reapplied;with family/visitor present;with nursing/sitter in room Nurse Communication: Mobility status PT Visit Diagnosis: Other abnormalities of gait and mobility (R26.89)     Time: 4098-1191 PT Time Calculation (min) (ACUTE ONLY): 18 min  Charges:    $Gait Training: 8-22 mins PT General Charges $$ ACUTE PT VISIT: 1 Visit                     Kathlyn Sacramento, PT, DPT [facility]  Rehabilitation Services Physical Therapist Office: [phone] Website: [email or web address]    Berton Mount 01/30/2023, 10:35 AM

## 2023-01-30 NOTE — TOC Transition Note (Signed)
Transition of Care [facility]) - CM/SW Discharge Note   Patient Details  Name: Mary Mercado MRN: [id number] Date of Birth: [birth year]  [facility] CM/SW Contact:  Kermit Balo, RN Phone Number: 01/30/2023, 11:59 AM   Clinical Narrative:     Patient is discharging to CIR today. CM signing off.   Final next level of care: IP Rehab Facility Barriers to Discharge: No Barriers Identified   Patient Goals and CMS Choice CMS Medicare.gov Compare Post Acute Care list provided to:: Patient Choice offered to / list presented to : Patient, Spouse  Discharge Placement                         Discharge Plan and Services Additional resources added to the After Visit Summary for                                       Social Determinants of Health (SDOH) Interventions SDOH Screenings   Food Insecurity: No Food Insecurity (01/24/2023)  Housing: Low Risk  (01/24/2023)  Transportation Needs: No Transportation Needs (01/24/2023)  Utilities: Not At Risk (01/24/2023)  Alcohol Screen: Low Risk  (05/27/2022)  Depression (PHQ2-9): Low Risk  (05/27/2022)  Financial Resource Strain: Low Risk  (05/27/2022)  Physical Activity: Insufficiently Active (05/27/2022)  Social Connections: Socially Integrated (05/27/2022)  Stress: No Stress Concern Present (05/27/2022)  Tobacco Use: Low Risk  (01/25/2023)     Readmission Risk Interventions     No data to display

## 2023-01-30 NOTE — Progress Notes (Signed)
Inpatient Rehab Admissions Coordinator:  There is a bed available for pt in CIR today. Dr. Viviann Spare and Hetty Blend, NP are aware and in agreement. Pt, pt's husband [name], NSG and TOC made aware.   Wolfgang Phoenix, MS, CCC-SLP Admissions Coordinator [phone]

## 2023-01-30 NOTE — H&P (Addendum)
Physical Medicine and Rehabilitation Admission H&P    Chief Complaint  Patient presents with   Code Stroke   : HPI: Britainy Kozub. 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  Per chart review patient lives with spouse.  [facility]dependent living facility[facility].  Used a rollator for mobility.  Presented 01/24/2023 with acute onset of left-sided weakness and slurred speech.  Cranial CT scan negative for acute findings.  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  Underwent emergent thrombectomy 01/24/2023 per interventional radiology.  She was not eligible for MRA due to pacemaker lead being in the atrium.  Echocardiogram with ejection fraction of 60 to 65% no wall motion abnormalities.  Follow-up CT showed beam hardening artifact at the level of the mid/anterior right temporal lobe no intracranial hemorrhage.  In regards to patient recent COVID infection diagnosed 9/25/ 2024 completed Paxlovid course.  Follow-up testing 10/1 positive patient asymptomatic currently on airborne contact precautions..  She is currently maintained on Pradaxa for CVA prophylaxis.  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  therapy evaluations completed due to patient decreased functional mobility and left-sided weakness was admitted for a comprehensive rehab program.  Review of Systems  Constitutional:  Negative for chills and fever.  HENT:  Positive for hearing loss.   Eyes:  Positive for blurred vision.       Hx glaucoma   Respiratory:  Negative for cough, shortness of breath and wheezing.   Cardiovascular:  Positive for palpitations. Negative for chest pain and leg swelling.   Gastrointestinal:  Positive for constipation. Negative for heartburn, nausea and vomiting.  Genitourinary:  Negative for dysuria, flank pain and hematuria.  Musculoskeletal:  Positive for joint pain and myalgias.  Skin:  Positive for itching. Negative for rash.  Neurological:  Positive for speech change, seizures and weakness.  All other systems reviewed and are negative.  Past Medical History:  Diagnosis Date   Allergy    CVA (cerebral vascular accident) (HCC) 05/2018, 01/09/20   Hyperlipidemia LDL goal <70    Hypertension    Pacemaker    Palpitations    Vertigo, benign positional    Past Surgical History:  Procedure Laterality Date   AV NODE ABLATION N/A 03/30/2020   Procedure: AV NODE ABLATION;  Surgeon: Duke Salvia, MD;  Location: [facility] INVASIVE CV LAB;  Service: Cardiovascular;  Laterality: N/A;   AV NODE ABLATION N/A 03/30/2020   Procedure: AV NODE ABLATION;  Surgeon: Duke Salvia, MD;  Location: [facility] INVASIVE CV LAB;  Service: Cardiovascular;  Laterality: N/A;   CARDIOVERSION Left 10/31/2018   Procedure: CARDIOVERSION;  Surgeon: Elease Hashimoto Deloris Ping, MD;  Location: [facility] ENDOSCOPY;  Service: Cardiovascular;  Laterality: Left;   CARDIOVERSION N/A 01/08/2020   Procedure: CARDIOVERSION;  Surgeon: Parke Poisson, MD;  Location: [facility] ENDOSCOPY;  Service: Cardiovascular;  Laterality: N/A;   Hemilaminectomy and microdiskectomy at L4-5 on the left.  12/10/2003   IR CT HEAD LTD  01/24/2023   IR PERCUTANEOUS ART THROMBECTOMY/INFUSION INTRACRANIAL INC DIAG ANGIO  01/24/2023   IR US GUIDE VASC ACCESS RIGHT  01/24/2023   PACEMAKER IMPLANT N/A 03/30/2020   Procedure: PACEMAKER IMPLANT;  Surgeon: Duke Salvia, MD;  Location: [facility] INVASIVE CV LAB;  Service: Cardiovascular;  Laterality: N/A;   RADIOLOGY WITH ANESTHESIA N/A 01/24/2023   Procedure: IR WITH ANESTHESIA;  Surgeon: Radiologist, Medication, MD;  Location: [facility] OR;  Service: Radiology;  Laterality: N/A;   TEE WITHOUT CARDIOVERSION N/A 06/05/2018    Procedure: TRANSESOPHAGEAL ECHOCARDIOGRAM (TEE);  Surgeon: Lewayne Bunting, MD;  Location: [facility] ENDOSCOPY;  Service: Cardiovascular;  Laterality: N/A;  loop   Family History  Problem Relation Age of Onset   Diabetes Mother    Heart attack Mother        81   Colon cancer Father    Heart attack Brother 62   Sleep apnea Neg Hx    Social History:  reports that she has never smoked. She has never used smokeless tobacco. She reports that she does not drink alcohol and does not use drugs. Allergies:  Allergies  Allergen Reactions   Duloxetine     Causes Bp elevation.  Increased body jerks, nausea   Acetaminophen Hives and Rash   Medications Prior to Admission  Medication Sig Dispense Refill   amLODipine (NORVASC) 2.5 MG tablet TAKE 1 TABLET BY MOUTH EVERY DAY 90 tablet 0   atorvastatin (LIPITOR) 80 MG tablet Take 1 tablet (80 mg total) by mouth daily.     dabigatran (PRADAXA) 150 MG CAPS capsule Take 1 capsule (150 mg total) by mouth every 12 (twelve) hours.     dorzolamide-timolol (COSOPT) 2-0.5 % ophthalmic solution Place 1 drop into both eyes 2 (two) times daily.     fluorouracil (EFUDEX) 5 % cream Apply 1 Application topically 2 (two) times daily.     latanoprost (XALATAN) 0.005 % ophthalmic solution Place 1 drop into both eyes at bedtime.     levETIRAcetam (KEPPRA) 500 MG tablet Take 1 tablet (500 mg total) by mouth 2 (two) times daily. 180 tablet 3        Home: Home Living Family/patient expects to be discharged to:: Private residence Living Arrangements: Spouse/significant other Available Help at Discharge: Family, Available 24 hours/day Type of Home: Independent living facility Home Access: Elevator Home Layout: One level Bathroom Shower/Tub: Health visitor: Handicapped height Bathroom Accessibility: Yes Home Equipment: Rollator (4 wheels), Shower seat - built in, Grab bars - tub/shower, Grab bars - toilet, Hand held shower head Additional Comments:  Pt lives at [facility] [employer] [facility] area)  Lives With: Spouse   Functional History: Prior Function Prior Level of Function : Independent/Modified Independent Mobility Comments: rollator for amb ADLs Comments: goes to dining room for 1 meal/day, sits to shower, manages meds with husband   Functional Status:  Mobility: Bed Mobility Overal bed mobility: Needs Assistance Bed Mobility: Supine to Sit Supine to sit: Supervision, Used rails, HOB elevated Sit to supine: Contact guard assist General bed mobility comments: Extra time, no physical assist but provided cues for technique with low HOB setting. Transfers Overall transfer level: Needs assistance Equipment used: Rolling walker (2 wheels) Transfers: Sit to/from Stand Sit to Stand: Contact guard assist Bed to/from chair/wheelchair/BSC transfer type:: Step pivot Step pivot transfers: Min assist General transfer comment: CGA for safety and balance from bed and toilet today. Slow to rise, cues for hand placement. Minor posterior lean noted. Ambulation/Gait Ambulation/Gait assistance: Min assist Gait Distance (Feet): 75 Feet Assistive device: Rolling walker (2 wheels) Gait Pattern/deviations: Step-through pattern, Decreased stride length, Shuffle General Gait Details: Good posture but slow and intermittently shuffling steps, especially in congested areas. min assist for RW control rarely, mostly at [facility] level with minor instability despite  RW support. Cues for awareness, technique, and walker proximity with turns. Gait velocity: decreased Gait velocity interpretation: <1.31 ft/sec, indicative of household ambulator   ADL: ADL Overall ADL's : Needs assistance/impaired Eating/Feeding: Set up, Sitting Eating/Feeding Details (indicate cue type and reason): using L UE to hold applesauce cup Grooming: Wash/dry hands, Wash/dry face, Brushing hair, Oral care, Sitting, Set up Upper Body Bathing: Minimal assistance, Sitting Upper Body  Bathing Details (indicate cue type and reason): washed pt's back Lower Body Bathing: Contact guard assist, Sit to/from stand Upper Body Dressing : Set up, Sitting Lower Body Dressing: Contact guard assist, Sit to/from stand Lower Body Dressing Details (indicate cue type and reason): can don and doff socks Toilet Transfer: Contact guard assist, Ambulation, Regular Toilet, Grab bars, Rolling walker (2 wheels) Toileting- Clothing Manipulation and Hygiene: Minimal assistance, Sit to/from stand Toileting - Clothing Manipulation Details (indicate cue type and reason): assist for toilet paper out of dispenser Functional mobility during ADLs: Contact guard assist, Rolling walker (2 wheels)   Cognition: Cognition Overall Cognitive Status: Impaired/Different from baseline Orientation Level: Oriented X4 Cognition Arousal: Alert Behavior During Therapy: WFL for tasks assessed/performed Overall Cognitive Status: Impaired/Different from baseline Area of Impairment: Problem solving Problem Solving: Slow processing, Decreased initiation, Requires verbal cues General Comments: likely baseline      Physical Exam: Blood pressure 134/80, pulse 70, temperature (!) 97.4 F (36.3 C), resp. rate (!) 24, weight 46.8 kg, SpO2 99%.  Body mass index is 17.17 kg/m.   General: No apparent distress, Patient is alert, sitting up in bed, very thin appearing HEENT: Head is normocephalic, atraumatic, sclera anicteric, oral mucosa pink and moist Neck: Supple without JVD or lymphadenopathy Heart: Reg rate and rhythm. Pacemaker L chest  Chest: CTA bilaterally without wheezes, rales, or rhonchi; no distress Abdomen: Soft, non-tender, non-distended, bowel sounds positive. Extremities: Trace b/l LE edema Psych: Pt's affect is appropriate. Pt is cooperative Skin: Bruising b/l UE MSK: 5th digit deformity R hand-chronic    Neuro:    Mental Status: AAOx4, memory intact Speech/Languate: Naming and repetition intact,  fluent, follows simple commands. Voice hoarse/mild dysarthria, delayed responses at times  CRANIAL NERVES: II: PERRL. Able to count fingers with both eyes III, IV, VI: EOM intact, no gaze preference or deviation V: normal sensation bilaterally VII: no asymmetry VIII: Hard of hearing IX, X: normal palatal elevation XI: head turn and shoulder shrug intact bilaterally XII: Tongue midline   MOTOR: RUE: 5/5 Deltoid, 5/5 Biceps, 5/5 Triceps,5/5 Grip LUE: 5/5 Deltoid, 5/5 Biceps, 5/5 Triceps, 5/5 Grip RLE: HF 4+/5, KE 4+/5, ADF 4+/5, APF 4+/5 LLE: HF 4/5, KE 4+/5, ADF 4+/5, APF 4+/5   REFLEXES: 2/4 throughout, bilateral flexor plantar response, no Hoffman's, no clonus  SENSORY: Normal to touch all 4 extremities, alerted in left hand   Coordination: Normal finger to nose and heel to shin, no tremor, no dysmetria   Results for orders placed or performed during the hospital encounter of 01/24/23 (from the past 48 hour(s))  Glucose, capillary     Status: None   Collection Time: 01/28/23  4:56 PM  Result Value Ref Range   Glucose-Capillary 82 70 - 99 mg/dL    Comment: Glucose reference range applies only to samples taken after fasting for at least 8 hours.  Glucose, capillary     Status: Abnormal   Collection Time: 01/28/23  9:23 PM  Result Value Ref Range   Glucose-Capillary 126 (H) 70 - 99 mg/dL    Comment: Glucose reference range applies  only to samples taken after fasting for at least 8 hours.   Comment 1 Notify RN   Glucose, capillary     Status: Abnormal   Collection Time: 01/29/23  7:09 AM  Result Value Ref Range   Glucose-Capillary 136 (H) 70 - 99 mg/dL    Comment: Glucose reference range applies only to samples taken after fasting for at least 8 hours.   Comment 1 Notify RN   Basic metabolic panel     Status: Abnormal   Collection Time: 01/29/23 11:56 AM  Result Value Ref Range   Sodium 137 135 - 145 mmol/L   Potassium 4.3 3.5 - 5.1 mmol/L   Chloride 100 98 - 111  mmol/L   CO2 24 22 - 32 mmol/L   Glucose, Bld 110 (H) 70 - 99 mg/dL    Comment: Glucose reference range applies only to samples taken after fasting for at least 8 hours.   BUN 12 8 - 23 mg/dL   Creatinine, Ser 2.72 0.44 - 1.00 mg/dL   Calcium 8.9 8.9 - 53.6 mg/dL   GFR, Estimated >64 >40 mL/min    Comment: (NOTE) Calculated using the CKD-EPI Creatinine Equation (2021)    Anion gap 13 5 - 15    Comment: Performed at [facility], [address], [place], [place] [address]  CBC     Status: None   Collection Time: 01/29/23 11:56 AM  Result Value Ref Range   WBC 7.6 4.0 - 10.5 K/uL   RBC 4.75 3.87 - 5.11 MIL/uL   Hemoglobin 14.4 12.0 - 15.0 g/dL   HCT 59.5 63.8 - 75.6 %   MCV 91.8 80.0 - 100.0 fL   MCH 30.3 26.0 - 34.0 pg   MCHC 33.0 30.0 - 36.0 g/dL   RDW 43.3 29.5 - 18.8 %   Platelets 242 150 - 400 K/uL   nRBC 0.0 0.0 - 0.2 %    Comment: Performed at [facility], [address], [place], [place] [address]  Glucose, capillary     Status: None   Collection Time: 01/29/23 12:36 PM  Result Value Ref Range   Glucose-Capillary 98 70 - 99 mg/dL    Comment: Glucose reference range applies only to samples taken after fasting for at least 8 hours.  Glucose, capillary     Status: Abnormal   Collection Time: 01/29/23  4:31 PM  Result Value Ref Range   Glucose-Capillary 110 (H) 70 - 99 mg/dL    Comment: Glucose reference range applies only to samples taken after fasting for at least 8 hours.  Glucose, capillary     Status: Abnormal   Collection Time: 01/29/23  9:06 PM  Result Value Ref Range   Glucose-Capillary 116 (H) 70 - 99 mg/dL    Comment: Glucose reference range applies only to samples taken after fasting for at least 8 hours.   Comment 1 Notify RN    Comment 2 Document in Chart   Glucose, capillary     Status: None   Collection Time: 01/30/23  6:19 AM  Result Value Ref Range   Glucose-Capillary 97 70 - 99 mg/dL    Comment: Glucose reference range applies only to  samples taken after fasting for at least 8 hours.   Comment 1 Notify RN    Comment 2 Document in Chart   Glucose, capillary     Status: Abnormal   Collection Time: 01/30/23 12:09 PM  Result Value Ref Range   Glucose-Capillary 119 (H)  70 - 99 mg/dL    Comment: Glucose reference range applies only to samples taken after fasting for at least 8 hours.   No results found.    Blood pressure 134/80, pulse 70, temperature (!) 97.4 F (36.3 C), resp. rate (!) 24, weight 46.8 kg, SpO2 99%.  Medical Problem List and Plan: 1. 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  Antithrombotics: -DVT/anticoagulation:  Pharmaceutical: Other (comment) Pradaxa  -antiplatelet therapy: N/A 3. Pain Management: Tylenol as needed 4. Mood/Behavior/Sleep: Provide emotional support  -antipsychotic agents: N/A 5. Neuropsych/cognition: This patient is capable of making decisions on her own behalf. 6. Skin/Wound Care: Routine skin checks 7. Fluids/Electrolytes/Nutrition: Routine in and outs with follow-up chemistries 8.  COVID infection.  Recent COVID infection diagnosed 9/25.  Completed Paxlovid course.  Follow-up testing +10/1 on retest patient asymptomatic.  Precautions completed 01/29/2023 9.  Atrial fibrillation/pacemaker.  Pradaxa.  Cardiac rate controlled 10.  History of seizure.  Keppra 500 mg twice daily.  Followed by [name], NP at [facility] 11.  Hyperlipidemia.  Lipitor 12. Hypokalemia. Recheck labs 13. Dysphagia. Dys 3/thin  -Continue SLP  14. HTN. Home meds norvasc and cozzar. Long term goal normotensive. Monitor with activity  Charlton Amor, PA-C 01/30/2023   I have personally performed a face to face diagnostic evaluation of this patient and formulated the key components of the plan.   Additionally, I have personally reviewed laboratory data, imaging studies, as well as relevant notes and concur with the physician assistant's documentation above.  The patient's status has not changed from the original H&P.  Any changes in documentation from the acute care chart have been noted above.  Fanny Dance, MD, [place]

## 2023-01-30 NOTE — Progress Notes (Signed)
Inpatient Rehabilitation Admission Medication Review by a Pharmacist  A complete drug regimen review was completed for this patient to identify any potential clinically significant medication issues.  High Risk Drug Classes Is patient taking? Indication by Medication  Antipsychotic No   Anticoagulant Yes Dabigartran - atrial fibrillation and CVA prophylaxis  Antibiotic No   Opioid No   Antiplatelet No   Hypoglycemics/insulin No   Vasoactive Medication No   Chemotherapy No   Other Yes Atorvastatin - hyperlipidemia Famotidine - GERD Levetiracetam - hx seizures Dorzolamide-timolol, latanoprost - glaucoma  PRNs: Docusate, miralax, senokot S - constipation     Type of Medication Issue Identified Description of Issue Recommendation(s)  Drug Interaction(s) (clinically significant)     Duplicate Therapy     Allergy     No Medication Administration End Date     Incorrect Dose     Additional Drug Therapy Needed     Significant med changes from prior encounter (inform family/care partners about these prior to discharge). Losartan and metoprolol discontinued while inpatient.  Communicate changes with patient/family prior to discharge.  Other  Discharge summary indicates plan to resume prior Amlodipine 2.5 mg daily.  Also indicates plan to resume Fluorouracil cream BID.      Using Latanoprost for tarfluprost while in the hospital.  Monitor blood pressure and resume if/when indicated.  Not stocked at [facility]; would have to provide own supply to continue while inpatient. Prescribed 01/03/23 for 33 weeks. Unsure of indication.   Resume tarfluprost at discharge.    Clinically significant medication issues were identified that warrant physician communication and completion of prescribed/recommended actions by midnight of the next day:  No  Time spent performing this drug regimen review (minutes):  20   Dennie Fetters, [place] 01/30/2023 5:27 PM

## 2023-01-31 DIAGNOSIS — I69354 Hemiplegia and hemiparesis following cerebral infarction affecting left non-dominant side: Secondary | ICD-10-CM

## 2023-01-31 DIAGNOSIS — R0789 Other chest pain: Secondary | ICD-10-CM

## 2023-01-31 DIAGNOSIS — E876 Hypokalemia: Secondary | ICD-10-CM | POA: Diagnosis not present

## 2023-01-31 LAB — CBC WITH DIFFERENTIAL/PLATELET
Abs Immature Granulocytes: 0.02 10*3/uL (ref 0.00–0.07)
Basophils Absolute: 0.1 10*3/uL (ref 0.0–0.1)
Basophils Relative: 1 %
Eosinophils Absolute: 0.1 10*3/uL (ref 0.0–0.5)
Eosinophils Relative: 3 %
HCT: 39.2 % (ref 36.0–46.0)
Hemoglobin: 13 g/dL (ref 12.0–15.0)
Immature Granulocytes: 0 %
Lymphocytes Relative: 28 %
Lymphs Abs: 1.5 10*3/uL (ref 0.7–4.0)
MCH: 29.8 pg (ref 26.0–34.0)
MCHC: 33.2 g/dL (ref 30.0–36.0)
MCV: 89.9 fL (ref 80.0–100.0)
Monocytes Absolute: 0.6 10*3/uL (ref 0.1–1.0)
Monocytes Relative: 12 %
Neutro Abs: 2.9 10*3/uL (ref 1.7–7.7)
Neutrophils Relative %: 56 %
Platelets: 223 10*3/uL (ref 150–400)
RBC: 4.36 MIL/uL (ref 3.87–5.11)
RDW: 12.7 % (ref 11.5–15.5)
WBC: 5.2 10*3/uL (ref 4.0–10.5)
nRBC: 0 % (ref 0.0–0.2)

## 2023-01-31 LAB — COMPREHENSIVE METABOLIC PANEL
ALT: 24 U/L (ref 0–44)
AST: 24 U/L (ref 15–41)
Albumin: 2.5 g/dL — ABNORMAL LOW (ref 3.5–5.0)
Alkaline Phosphatase: 68 U/L (ref 38–126)
Anion gap: 10 (ref 5–15)
BUN: 18 mg/dL (ref 8–23)
CO2: 27 mmol/L (ref 22–32)
Calcium: 8.5 mg/dL — ABNORMAL LOW (ref 8.9–10.3)
Chloride: 101 mmol/L (ref 98–111)
Creatinine, Ser: 0.52 mg/dL (ref 0.44–1.00)
GFR, Estimated: >60 mL/min (ref >60)
Glucose, Bld: 88 mg/dL (ref 70–99)
Potassium: 3.4 mmol/L — ABNORMAL LOW (ref 3.5–5.1)
Sodium: 138 mmol/L (ref 135–145)
Total Bilirubin: 0.9 mg/dL (ref 0.3–1.2)
Total Protein: 5.9 g/dL — ABNORMAL LOW (ref 6.5–8.1)

## 2023-01-31 MED ORDER — POTASSIUM CHLORIDE CRYS ER 10 MEQ PO TBCR
10.0000 meq | EXTENDED_RELEASE_TABLET | Freq: Every day | ORAL | Status: DC
  Administered 2023-02-01 – 2023-02-03 (×3): 10 meq via ORAL
  Filled 2023-01-31 (×3): qty 1

## 2023-01-31 NOTE — Progress Notes (Signed)
Met with patient to review current situation, team conferences and plan of care.Reviewed medications and dietary recommendations of secondary risks including HTN, HLD, (LDL 61/TRI 90) , prediabetes (A1C6.6).Discussed medications pradaxa, keppra, lipitor. Continue to follow along to discharge, to provide educational need to facilitate preparation for discharge.Gwenyth Allegra, RN

## 2023-01-31 NOTE — Plan of Care (Signed)
  Problem: RH Balance Goal: LTG Patient will maintain dynamic standing balance (PT) Description: LTG:  Patient will maintain dynamic standing balance with assistance during mobility activities (PT) Flowsheets (Taken 01/31/2023 1233) LTG: Pt will maintain dynamic standing balance during mobility activities with:: Independent with assistive device    Problem: Sit to Stand Goal: LTG:  Patient will perform sit to stand with assistance level (PT) Description: LTG:  Patient will perform sit to stand with assistance level (PT) Flowsheets (Taken 01/31/2023 1233) LTG: PT will perform sit to stand in preparation for functional mobility with assistance level: Independent with assistive device   Problem: RH Bed Mobility Goal: LTG Patient will perform bed mobility with assist (PT) Description: LTG: Patient will perform bed mobility with assistance, with/without cues (PT). Flowsheets (Taken 01/31/2023 1233) LTG: Pt will perform bed mobility with assistance level of: Independent with assistive device    Problem: RH Bed to Chair Transfers Goal: LTG Patient will perform bed/chair transfers w/assist (PT) Description: LTG: Patient will perform bed to chair transfers with assistance (PT). Flowsheets (Taken 01/31/2023 1233) LTG: Pt will perform Bed to Chair Transfers with assistance level: Independent with assistive device    Problem: RH Car Transfers Goal: LTG Patient will perform car transfers with assist (PT) Description: LTG: Patient will perform car transfers with assistance (PT). Flowsheets (Taken 01/31/2023 1233) LTG: Pt will perform car transfers with assist:: Supervision/Verbal cueing   Problem: RH Ambulation Goal: LTG Patient will ambulate in controlled environment (PT) Description: LTG: Patient will ambulate in a controlled environment, # of feet with assistance (PT). Flowsheets (Taken 01/31/2023 1233) LTG: Pt will ambulate in controlled environ  assist needed:: Supervision/Verbal cueing LTG:  Ambulation distance in controlled environment: 300 Note: With LRAD Goal: LTG Patient will ambulate in home environment (PT) Description: LTG: Patient will ambulate in home environment, # of feet with assistance (PT). Flowsheets (Taken 01/31/2023 1233) LTG: Pt will ambulate in home environ  assist needed:: Independent with assistive device LTG: Ambulation distance in home environment: 50' Note: With LRAD   Problem: RH Stairs Goal: LTG Patient will ambulate up and down stairs w/assist (PT) Description: LTG: Patient will ambulate up and down # of stairs with assistance (PT) Flowsheets (Taken 01/31/2023 1233) LTG: Pt will ambulate up/down stairs assist needed:: Contact Guard/Touching assist LTG: Pt will  ambulate up and down number of stairs: 1 Note: Curb step with rollator

## 2023-01-31 NOTE — Progress Notes (Signed)
Inpatient Rehabilitation  Patient information reviewed and entered into eRehab system by Oyuki Hogan M. Eri Mcevers, M.A., CCC/SLP, PPS Coordinator.  Information including medical coding, functional ability and quality indicators will be reviewed and updated through discharge.    

## 2023-01-31 NOTE — Progress Notes (Signed)
Patient ID: Mary Mercado, female   DOB: 1940-07-15, 82 y.o.   MRN: [id number]  SW spoke with [facility] Coordinator [phone]) to discuss pt current discharge date, and get contact information for contracted therapy site. Reports therapy is with [employer] (p:[phone]/f:[phone]).  1212- SW spoke with [facility] and confirms all three therapies available, and will need order with clinicals.   SW made efforts to meet with pt to complete assessment and provide updates from team conference, but pt in ST session. SW will follow-up.   1329- SW spoke with pt husband Mary Mercado to introduce self, explain role, discuss discharge process, provide updates from team conference, and discussed family education. Fam edu schedules for Friday (10/11) 9am-12pm. SW also informed on above about continued therapies.   Cecile Sheerer, MSW, LCSWA Office: [phone] Cell: [phone] Fax: [phone]

## 2023-01-31 NOTE — Progress Notes (Signed)
Occupational Therapy Assessment and Plan  Patient Details  Name: Mary Mercado MRN: [id number] Date of Birth: 08/11/1940  OT Diagnosis: ataxia, hemiplegia affecting non-dominant side, and muscle weakness (generalized) Rehab Potential: Rehab Potential (ACUTE ONLY): Excellent ELOS: 7/10 days   Today's Date: 01/31/2023 OT Individual Time: 9485-4627 OT Individual Time Calculation (min): 57 min     Hospital Problem: Principal Problem:   Right middle cerebral artery stroke (HCC) Active Problems:   Hyperlipidemia   Hypokalemia   Hx of seizure disorder   Past Medical History:  Past Medical History:  Diagnosis Date   Allergy    CVA (cerebral vascular accident) (HCC) 05/2018, 01/09/20   Hyperlipidemia LDL goal <70    Hypertension    Pacemaker    Palpitations    Vertigo, benign positional    Past Surgical History:  Past Surgical History:  Procedure Laterality Date   AV NODE ABLATION N/A 03/30/2020   Procedure: AV NODE ABLATION;  Surgeon: Mary Salvia, MD;  Location: [facility] INVASIVE CV LAB;  Service: Cardiovascular;  Laterality: N/A;   AV NODE ABLATION N/A 03/30/2020   Procedure: AV NODE ABLATION;  Surgeon: Mary Salvia, MD;  Location: [facility] INVASIVE CV LAB;  Service: Cardiovascular;  Laterality: N/A;   CARDIOVERSION Left 10/31/2018   Procedure: CARDIOVERSION;  Surgeon: Mary Hashimoto Deloris Ping, MD;  Location: [facility] ENDOSCOPY;  Service: Cardiovascular;  Laterality: Left;   CARDIOVERSION N/A 01/08/2020   Procedure: CARDIOVERSION;  Surgeon: Mary Poisson, MD;  Location: [facility] ENDOSCOPY;  Service: Cardiovascular;  Laterality: N/A;   Hemilaminectomy and microdiskectomy at L4-5 on the left.  12/10/2003   IR CT HEAD LTD  01/24/2023   IR PERCUTANEOUS ART THROMBECTOMY/INFUSION INTRACRANIAL INC DIAG ANGIO  01/24/2023   IR US GUIDE VASC ACCESS RIGHT  01/24/2023   PACEMAKER IMPLANT N/A 03/30/2020   Procedure: PACEMAKER IMPLANT;  Surgeon: Mary Salvia, MD;  Location: [facility] INVASIVE CV LAB;  Service:  Cardiovascular;  Laterality: N/A;   RADIOLOGY WITH ANESTHESIA N/A 01/24/2023   Procedure: IR WITH ANESTHESIA;  Surgeon: Radiologist, Medication, MD;  Location: [facility] OR;  Service: Radiology;  Laterality: N/A;   TEE WITHOUT CARDIOVERSION N/A 06/05/2018   Procedure: TRANSESOPHAGEAL ECHOCARDIOGRAM (TEE);  Surgeon: Mary Bunting, MD;  Location: [facility] ENDOSCOPY;  Service: Cardiovascular;  Laterality: N/A;  loop    Assessment & Plan Clinical Impression: Patient is a 82 y.o. year old female with recent admission to the hospital on 10/1 with c/o L side weakness and slurred speech. She tested positive for covid 9/22. CTA revealed R M1 occlusion. She emergently underwent thrombectomy. PMH: hx of stroke x3, HLD, HTN, PAF, BPPV, s/p pacemaker Patient transferred to CIR on 01/30/2023 .    Patient currently requires min with basic self-care skills secondary to muscle weakness, decreased cardiorespiratoy endurance, decreased attention to left, and decreased standing balance, decreased postural control, and hemiplegia.  Prior to hospitalization, patient could complete BADL with modified independent .  Patient will benefit from skilled intervention to decrease level of assist with basic self-care skills and increase independence with basic self-care skills prior to discharge home with care partner.  Anticipate patient will require intermittent supervision and follow up outpatient .  OT - End of Session Endurance Deficit: Yes Endurance Deficit Description: rest breaks within BADL tasks OT Assessment Rehab Potential (ACUTE ONLY): Excellent OT Patient demonstrates impairments in the following area(s): Balance;Endurance;Motor;Cognition;Perception;Safety OT Basic ADL's Functional Problem(s): Bathing;Dressing;Toileting OT Transfers Functional Problem(s): Tub/Shower;Toilet OT Additional Impairment(s): Fuctional Use of Upper Extremity OT Plan OT Intensity: Minimum  of 1-2 x/day, 45 to 90 minutes OT Frequency: 5 out of 7  days OT Duration/Estimated Length of Stay: 7/10 days OT Treatment/Interventions: Balance/vestibular training;Cognitive remediation/compensation;Community reintegration;Discharge planning;Disease mangement/prevention;DME/adaptive equipment instruction;Functional electrical stimulation;Functional mobility training;Neuromuscular re-education;Pain management;Patient/family education;Psychosocial support;Self Care/advanced ADL retraining;Skin care/wound managment;Splinting/orthotics;Therapeutic Activities;Therapeutic Exercise;UE/LE Strength taining/ROM;UE/LE Coordination activities;Visual/perceptual remediation/compensation;Wheelchair propulsion/positioning OT Self Feeding Anticipated Outcome(s): mod I OT Basic Self-Care Anticipated Outcome(s): mod I OT Toileting Anticipated Outcome(s): mod I OT Bathroom Transfers Anticipated Outcome(s): mod I OT Recommendation Patient destination: Home Follow Up Recommendations: 24 hour supervision/assistance;Outpatient OT Equipment Recommended: To be determined   OT Evaluation Precautions/Restrictions  Precautions Precautions: Fall Restrictions Weight Bearing Restrictions: No Pain  Denies pain Home Living/Prior Functioning Home Living Family/patient expects to be discharged to:: Private residence Living Arrangements: Spouse/significant other Available Help at Discharge: Family, Available 24 hours/day Type of Home: Independent living facility Home Access: Elevator Home Layout: One level Bathroom Shower/Tub: Health visitor: Handicapped height Bathroom Accessibility: Yes Additional Comments: Pt lives at [facility] [employer] area)  Lives With: Spouse Prior Function Level of Independence: Independent with basic ADLs, Requires assistive device for independence, Needs assistance with homemaking (uses a rollator)  Able to Take Stairs?: No Driving: No Vision Baseline Vision/History: 3 Glaucoma Ability to See in Adequate Light: 1  Impaired Perception  Perception: Impaired Perception-Other Comments: mild L inattention Cognition Cognition Overall Cognitive Status: Impaired/Different from baseline Arousal/Alertness: Awake/alert Orientation Level: Person;Place;Situation Person: Oriented Place: Oriented Situation: Oriented Safety/Judgment: Appears intact Brief Interview for Mental Status (BIMS) Repetition of Three Words (First Attempt): 3 Temporal Orientation: Year: Correct Temporal Orientation: Month: Accurate within 5 days Temporal Orientation: Day: Correct Recall: "Sock": No, could not recall Recall: "Blue": Yes, no cue required Recall: "Bed": Yes, no cue required BIMS Summary Score: 13 Sensation Sensation Light Touch: Appears Intact Coordination Gross Motor Movements are Fluid and Coordinated: No Fine Motor Movements are Fluid and Coordinated: No Coordination and Movement Description: decreased smoothness and accuracy Motor  Motor Motor: Ataxia;Hemiplegia Motor - Skilled Clinical Observations: mild L hemiplegia  Trunk/Postural Assessment     Balance Static Sitting Balance Static Sitting - Balance Support: Feet supported Static Sitting - Level of Assistance: 5: Stand by assistance Dynamic Sitting Balance Dynamic Sitting - Balance Support: Feet supported Dynamic Sitting - Level of Assistance: 5: Stand by assistance Static Standing Balance Static Standing - Balance Support: During functional activity Static Standing - Level of Assistance: 5: Stand by assistance Dynamic Standing Balance Dynamic Standing - Balance Support: During functional activity Dynamic Standing - Level of Assistance: 4: Min assist Extremity/Trunk Assessment RUE Assessment RUE Assessment: Within Functional Limits LUE Assessment LUE Assessment: Exceptions to [facility] General Strength Comments: mild L hemiplegia 4-/5 grossly  Care Tool Care Tool Self Care Eating   Eating Assist Level: Set up assist    Oral Care    Oral Care  Assist Level: Supervision/Verbal cueing    Bathing   Body parts bathed by patient: Right arm;Chest;Left arm;Front perineal area;Abdomen;Right upper leg;Buttocks;Left upper leg;Right lower leg;Left lower leg;Face     Assist Level: Contact Guard/Touching assist    Upper Body Dressing(including orthotics)   What is the patient wearing?: Pull over shirt   Assist Level: Minimal Assistance - Patient > 75%    Lower Body Dressing (excluding footwear)   What is the patient wearing?: Underwear/pull up;Pants Assist for lower body dressing: Minimal Assistance - Patient > 75%    Putting on/Taking off footwear   What is the patient wearing?: Socks;Shoes Assist for footwear: Minimal Assistance - Patient >  75%       Care Tool Toileting Toileting activity   Assist for toileting: Contact Guard/Touching assist     Care Tool Bed Mobility Roll left and right activity        Sit to lying activity        Lying to sitting on side of bed activity   Lying to sitting on side of bed assist level: the ability to move from lying on the back to sitting on the side of the bed with no back support.: Supervision/Verbal cueing     Care Tool Transfers Sit to stand transfer   Sit to stand assist level: Contact Guard/Touching assist    Chair/bed transfer   Chair/bed transfer assist level: Contact Guard/Touching assist     Toilet transfer   Assist Level: Contact Guard/Touching assist     Care Tool Cognition  Expression of Ideas and Wants Expression of Ideas and Wants: 3. Some difficulty - exhibits some difficulty with expressing needs and ideas (e.g, some words or finishing thoughts) or speech is not clear  Understanding Verbal and Non-Verbal Content Understanding Verbal and Non-Verbal Content: 4. Understands (complex and basic) - clear comprehension without cues or repetitions   Memory/Recall Ability Memory/Recall Ability : Current season;Location of own room;Staff names and faces;That he or she is  in a hospital/hospital unit   Refer to Care Plan for Long Term Goals  SHORT TERM GOAL WEEK 1 OT Short Term Goal 1 (Week 1): LTG=STG 2/2 ELOS  Recommendations for other services: None    Skilled Therapeutic Intervention OT eval completed addressing rehab process, OT purpose, POC, ELOS, and goals.  Pt ambulated with RW and CGA for bathroom transfers, overall CGA/min A for BADL tasks. Pt with mild word finding deficits and some L inattention but overall doing very well. Pt left seated in wc with chair alarm on, call bell in reach, and needs met. See below for further details regarding BADL performance.    ADL ADL Eating: Set up Grooming: Setup Upper Body Bathing: Supervision/safety Lower Body Bathing: Minimal assistance Upper Body Dressing: Minimal assistance Lower Body Dressing: Minimal assistance;Contact guard Toileting: Minimal assistance;Contact guard Toilet Transfer: Contact guard Tub/Shower Transfer: Minimal assistance Mobility  Bed Mobility Bed Mobility: Supine to Sit Supine to Sit: Supervision/Verbal cueing Transfers Sit to Stand: Contact Guard/Touching assist Stand to Sit: Contact Guard/Touching assist   Discharge Criteria: Patient will be discharged from OT if patient refuses treatment 3 consecutive times without medical reason, if treatment goals not met, if there is a change in medical status, if patient makes no progress towards goals or if patient is discharged from hospital.  The above assessment, treatment plan, treatment alternatives and goals were discussed and mutually agreed upon: by patient  Mal Amabile 01/31/2023, 10:16 AM

## 2023-01-31 NOTE — Plan of Care (Signed)
  Problem: RH Balance Goal: LTG Patient will maintain dynamic standing with ADLs (OT) Description: LTG:  Patient will maintain dynamic standing balance with assist during activities of daily living (OT)  Flowsheets (Taken 01/31/2023 0845) LTG: Pt will maintain dynamic standing balance during ADLs with: Independent with assistive device   Problem: Sit to Stand Goal: LTG:  Patient will perform sit to stand in prep for activites of daily living with assistance level (OT) Description: LTG:  Patient will perform sit to stand in prep for activites of daily living with assistance level (OT) Flowsheets (Taken 01/31/2023 0845) LTG: PT will perform sit to stand in prep for activites of daily living with assistance level: Independent with assistive device   Problem: RH Bathing Goal: LTG Patient will bathe all body parts with assist levels (OT) Description: LTG: Patient will bathe all body parts with assist levels (OT) Flowsheets (Taken 01/31/2023 0845) LTG: Pt will perform bathing with assistance level/cueing: Independent with assistive device    Problem: RH Dressing Goal: LTG Patient will perform upper body dressing (OT) Description: LTG Patient will perform upper body dressing with assist, with/without cues (OT). Flowsheets (Taken 01/31/2023 0845) LTG: Pt will perform upper body dressing with assistance level of: Independent with assistive device Goal: LTG Patient will perform lower body dressing w/assist (OT) Description: LTG: Patient will perform lower body dressing with assist, with/without cues in positioning using equipment (OT) Flowsheets (Taken 01/31/2023 0845) LTG: Pt will perform lower body dressing with assistance level of: Independent with assistive device   Problem: RH Toilet Transfers Goal: LTG Patient will perform toilet transfers w/assist (OT) Description: LTG: Patient will perform toilet transfers with assist, with/without cues using equipment (OT) Flowsheets (Taken 01/31/2023  0845) LTG: Pt will perform toilet transfers with assistance level of: Independent with assistive device   Problem: RH Tub/Shower Transfers Goal: LTG Patient will perform tub/shower transfers w/assist (OT) Description: LTG: Patient will perform tub/shower transfers with assist, with/without cues using equipment (OT) Flowsheets (Taken 01/31/2023 0845) LTG: Pt will perform tub/shower stall transfers with assistance level of: Supervision/Verbal cueing

## 2023-01-31 NOTE — Evaluation (Signed)
Physical Therapy Assessment and Plan  Patient Details  Name: Mary Mercado MRN: [id number] Date of Birth: Sep 09, 1940  PT Diagnosis: Abnormality of gait, Difficulty walking, Impaired cognition, and Muscle weakness Rehab Potential: Good ELOS: 7 days   Today's Date: 01/31/2023 PT Individual Time: 1191-4782 + 9562-1308 PT Individual Time Calculation (min): 55 min + 27 min  Hospital Problem: Principal Problem:   Right middle cerebral artery stroke [facility]) Active Problems:   Hyperlipidemia   Hypokalemia   Hx of seizure disorder   Past Medical History:  Past Medical History:  Diagnosis Date   Allergy    CVA (cerebral vascular accident) (HCC) 05/2018, 01/09/20   Hyperlipidemia LDL goal <70    Hypertension    Pacemaker    Palpitations    Vertigo, benign positional    Past Surgical History:  Past Surgical History:  Procedure Laterality Date   AV NODE ABLATION N/A 03/30/2020   Procedure: AV NODE ABLATION;  Surgeon: Duke Salvia, MD;  Location: [facility] INVASIVE CV LAB;  Service: Cardiovascular;  Laterality: N/A;   AV NODE ABLATION N/A 03/30/2020   Procedure: AV NODE ABLATION;  Surgeon: Duke Salvia, MD;  Location: [facility] INVASIVE CV LAB;  Service: Cardiovascular;  Laterality: N/A;   CARDIOVERSION Left 10/31/2018   Procedure: CARDIOVERSION;  Surgeon: Elease Hashimoto Deloris Ping, MD;  Location: [facility] ENDOSCOPY;  Service: Cardiovascular;  Laterality: Left;   CARDIOVERSION N/A 01/08/2020   Procedure: CARDIOVERSION;  Surgeon: Parke Poisson, MD;  Location: [facility] ENDOSCOPY;  Service: Cardiovascular;  Laterality: N/A;   Hemilaminectomy and microdiskectomy at L4-5 on the left.  12/10/2003   IR CT HEAD LTD  01/24/2023   IR PERCUTANEOUS ART THROMBECTOMY/INFUSION INTRACRANIAL INC DIAG ANGIO  01/24/2023   IR US GUIDE VASC ACCESS RIGHT  01/24/2023   PACEMAKER IMPLANT N/A 03/30/2020   Procedure: PACEMAKER IMPLANT;  Surgeon: Duke Salvia, MD;  Location: [facility] INVASIVE CV LAB;  Service: Cardiovascular;   Laterality: N/A;   RADIOLOGY WITH ANESTHESIA N/A 01/24/2023   Procedure: IR WITH ANESTHESIA;  Surgeon: Radiologist, Medication, MD;  Location: [facility] OR;  Service: Radiology;  Laterality: N/A;   TEE WITHOUT CARDIOVERSION N/A 06/05/2018   Procedure: TRANSESOPHAGEAL ECHOCARDIOGRAM (TEE);  Surgeon: Lewayne Bunting, MD;  Location: [facility] ENDOSCOPY;  Service: Cardiovascular;  Laterality: N/A;  loop    Assessment & Plan Clinical Impression: Patient is a 82 year old right-handed female with history of hyperlipidemia, hypertension, 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  Per chart review patient lives with spouse.  [facility].  Used a rollator for mobility.  Presented 01/24/2023 with acute onset of left-sided weakness and slurred speech.  Cranial CT scan negative for acute findings.  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  Underwent emergent thrombectomy 01/24/2023 per interventional radiology.  She was not eligible for MRA due to pacemaker lead being in the atrium.  Echocardiogram with ejection fraction of 60 to 65% no wall motion abnormalities.  Follow-up CT showed beam hardening artifact at the level of the mid/anterior right temporal lobe no intracranial hemorrhage.  In regards to patient recent COVID infection diagnosed 9/25/ 2024 completed Paxlovid course.  Follow-up testing 10/1 positive patient asymptomatic currently on airborne contact precautions..  She is currently maintained on Pradaxa for CVA prophylaxis.  She reports visual deficits due to  glaucoma.  Patient currently requires min with mobility secondary to muscle weakness, decreased cardiorespiratoy endurance, impaired timing and sequencing and decreased coordination, glaucoma,  and decreased standing balance, decreased postural control, and decreased balance strategies.  Prior to hospitalization, patient was modified independent  with mobility and lived with Spouse in a Independent living facility home.  Home access is  Elevator.  Patient will benefit from skilled PT intervention to maximize safe functional mobility, minimize fall risk, and decrease caregiver burden for planned discharge home with intermittent assist.  Anticipate patient will benefit from follow up OP at discharge.  PT - End of Session Activity Tolerance: Tolerates 30+ min activity without fatigue Endurance Deficit: Yes Endurance Deficit Description: rest breaks with mobility PT Assessment Rehab Potential (ACUTE/IP ONLY): Good PT Barriers to Discharge: Home environment access/layout PT Barriers to Discharge Comments: impaired endurance for mobility in ILF PT Patient demonstrates impairments in the following area(s): Balance;Endurance;Motor;Perception PT Transfers Functional Problem(s): Bed Mobility;Bed to Chair;Furniture PT Locomotion Functional Problem(s): Ambulation;Stairs PT Plan PT Intensity: Minimum of 1-2 x/day ,45 to 90 minutes PT Frequency: 5 out of 7 days PT Duration Estimated Length of Stay: 7 days PT Treatment/Interventions: Ambulation/gait training;Disease management/prevention;Stair training;Visual/perceptual remediation/compensation;Pain management;Balance/vestibular training;DME/adaptive equipment instruction;Patient/family education;Therapeutic Activities;Cognitive remediation/compensation;Therapeutic Exercise;Community reintegration;Functional mobility training;UE/LE Strength taining/ROM;Discharge planning;Neuromuscular re-education;UE/LE Coordination activities PT Transfers Anticipated Outcome(s): modI PT Locomotion Anticipated Outcome(s): modI ambulatory PT Recommendation Recommendations for Other Services: None Follow Up Recommendations: Outpatient PT Patient destination:  Home Equipment Recommended: None recommended by PT Equipment Details: owns rollator   PT Evaluation Precautions/Restrictions Precautions Precautions: Fall Restrictions Weight Bearing Restrictions: No Pain Interference Pain Interference Pain Effect on Sleep: 1. Rarely or not at all Pain Interference with Therapy Activities: 1. Rarely or not at all Pain Interference with Day-to-Day Activities: 1. Rarely or not at all Home Living/Prior Functioning Home Living Available Help at Discharge: Family;Available 24 hours/day Type of Home: Independent living facility Home Access: Elevator Home Layout: One level Bathroom Shower/Tub: Health visitor: Handicapped height Bathroom Accessibility: Yes Additional Comments: Pt lives at [facility] [employer] area)  Lives With: Spouse Prior Function Level of Independence: Independent with basic ADLs;Requires assistive device for independence;Needs assistance with homemaking (uses a rollator)  Able to Take Stairs?: No Driving: No Vision/Perception  Vision - History Ability to See in Adequate Light: 1 Impaired Perception Perception: Impaired Preception Impairment Details: Inattention/Neglect Perception-Other Comments: mild L inattention  Cognition Overall Cognitive Status: Impaired/Different from baseline Arousal/Alertness: Awake/alert Orientation Level: Oriented X4 Safety/Judgment: Appears intact Sensation Sensation Light Touch: Appears Intact Coordination Gross Motor Movements are Fluid and Coordinated: No Fine Motor Movements are Fluid and Coordinated: No Coordination and Movement Description: decreased smoothness and accuracy Motor  Motor Motor: Ataxia;Hemiplegia Motor - Skilled Clinical Observations: mild L coordination deficits  Trunk/Postural Assessment  Cervical Assessment Cervical Assessment: Within Functional Limits Thoracic Assessment Thoracic Assessment: Within Functional Limits Lumbar  Assessment Lumbar Assessment: Within Functional Limits Postural Control Postural Control: Deficits on evaluation Righting Reactions: delayed and inadequate Protective Responses: decreased Postural Limitations: decreased  Balance Balance Balance Assessed: Yes Standardized Balance Assessment Standardized Balance Assessment: Timed Up and Go Test Timed Up and Go Test TUG: Normal TUG Normal TUG (seconds): 37.06 Static Sitting Balance Static Sitting - Balance Support: Feet supported Static Sitting - Level of Assistance: 5: Stand by assistance Dynamic Sitting Balance Dynamic Sitting - Balance Support: Feet supported Dynamic Sitting - Level of Assistance: 5: Stand by assistance Static Standing Balance Static Standing - Balance Support: During functional activity Static Standing - Level  of Assistance: 5: Stand by assistance Dynamic Standing Balance Dynamic Standing - Balance Support: During functional activity Dynamic Standing - Level of Assistance: 4: Min assist (CGA) Extremity Assessment  RLE Assessment RLE Assessment: Exceptions to [facility] General Strength Comments: grossly 3+/5 LLE Assessment LLE Assessment: Exceptions to [facility] General Strength Comments: grossly 3+/5  Care Tool Care Tool Bed Mobility Roll left and right activity   Roll left and right assist level: Supervision/Verbal cueing    Sit to lying activity   Sit to lying assist level: Supervision/Verbal cueing    Lying to sitting on side of bed activity   Lying to sitting on side of bed assist level: the ability to move from lying on the back to sitting on the side of the bed with no back support.: Supervision/Verbal cueing     Care Tool Transfers Sit to stand transfer   Sit to stand assist level: Contact Guard/Touching assist    Chair/bed transfer   Chair/bed transfer assist level: Contact Guard/Touching assist     Toilet transfer   Assist Level: Contact Guard/Touching assist    Car transfer   Car transfer  assist level: Contact Guard/Touching assist      Care Tool Locomotion Ambulation   Assist level: Contact Guard/Touching assist Assistive device: Rollator Max distance: 200'  Walk 10 feet activity   Assist level: Contact Guard/Touching assist Assistive device: Rollator   Walk 50 feet with 2 turns activity   Assist level: Contact Guard/Touching assist Assistive device: Rollator  Walk 150 feet activity   Assist level: Contact Guard/Touching assist Assistive device: Rollator  Walk 10 feet on uneven surfaces activity   Assist level: Contact Guard/Touching assist Assistive device: Rollator  Stairs Stair activity did not occur: Safety/medical concerns        Walk up/down 1 step activity Walk up/down 1 step or curb (drop down) activity did not occur: Safety/medical concerns      Walk up/down 4 steps activity Walk up/down 4 steps activity did not occur: Safety/medical concerns      Walk up/down 12 steps activity Walk up/down 12 steps activity did not occur: Safety/medical concerns      Pick up small objects from floor Pick up small object from the floor (from standing position) activity did not occur: Safety/medical concerns      Wheelchair Is the patient using a wheelchair?: No (pt ambulates to/from room to gym)          Wheel 50 feet with 2 turns activity      Wheel 150 feet activity        Refer to Care Plan for Long Term Goals  SHORT TERM GOAL WEEK 1 PT Short Term Goal 1 (Week 1): STG = LTG due to ELOS  Recommendations for other services: None   Skilled Therapeutic Intervention SESSION 1: Evaluation completed (see details above and below) with education on PT POC and goals and individual treatment initiated with focus on gait training and participation with self care tasks. Pt denies pain. Pt demonstrates good safety awareness throughout session with all mobility. Pt completes transfers with RW and rollator CGA demonstrating good positioning prior to sitting and  reaches back for sitting surface prior to sitting without prompting, requires CGA for stability with positioning. Pt completes toileting during session, continent of urine, completes 3/3 toileting tasks with CGA. Pt ambulates with RW ~150' with CGA demonstrating increased path deviation, slowed gait speed and short stride length. Pt ambulates ~200' with rollator, demonstrates decreased path deviation and improved stride  length/gait speed, provided with rollator to use with room. Pt completes TUG with rollator 2 trials as follows: Trial 1: 34.68 Trial 2: 39.45 Average speed 37.06 seconds, indicating increased risk for falls. Pt returns to room and remains seated in recliner with all needs within reach, call light in place, and chair alarm donned and activated at end of session.   SESSION 2: Pt presents in room in recliner, agreeable to PT, does not report pain. Session focused on gait training with rollator for tolerance to upright, over ramp, and multidirectional stepping stability.  Pt ambulates from room to ortho gym ~300' with rollator CGA demonstrating slow gait speed at first but improves gait speed with increased time. Pt completes up/down ramp with CGA demonstrates great safety awareness using brakes with ambulating down ramp CGA for postural stability. Pt completes car transfer with CGA and rollator. Pt completes ambulation forward/backward 3x10' with CGA. Seated rest break provided for energy conservation. Pt then ambulates back to room with rollator, pt distractible in hallway and demonstrates slight L sided inattention requiring verbal cues for positioning in hallway however demonstrates good obstacle negotiation without cues.  Pt returns to room and remains seated in recliner with all needs within reach, cal light in place and chair alarm donned and activated at end of session.   Mobility Bed Mobility Bed Mobility: Supine to Sit Supine to Sit: Supervision/Verbal  cueing Transfers Transfers: Sit to Stand;Stand to Sit;Stand Pivot Transfers Sit to Stand: Contact Guard/Touching assist Stand to Sit: Contact Guard/Touching assist Stand Pivot Transfers: Contact Guard/Touching assist Transfer (Assistive device): Rollator Locomotion  Gait Ambulation: Yes Gait Assistance: Contact Guard/Touching assist Gait Distance (Feet): 200 Feet Assistive device: Rolling walker;Rollator Gait Gait: Yes Gait Pattern: Impaired Gait Pattern: Step-through pattern;Decreased stride length;Narrow base of support Gait velocity: decreased Stairs / Additional Locomotion Stairs: No Wheelchair Mobility Wheelchair Mobility: No   Discharge Criteria: Patient will be discharged from PT if patient refuses treatment 3 consecutive times without medical reason, if treatment goals not met, if there is a change in medical status, if patient makes no progress towards goals or if patient is discharged from hospital.  The above assessment, treatment plan, treatment alternatives and goals were discussed and mutually agreed upon: by patient and by family  Edwin Cap PT, DPT 01/31/2023, 12:09 PM

## 2023-01-31 NOTE — Patient Care Conference (Signed)
Inpatient RehabilitationTeam Conference and Plan of Care Update Date: 01/31/2023   Time: 1049am    Patient Name: Mary Mercado      Medical Record Number: [id number]  Date of Birth: 17-Aug-1940 Sex: Female         Room/Bed: 4W25C/4W25C-01 Payor Info: Payor: MEDICARE / Plan: MEDICARE PART A AND B / Product Type: *No Product type* /    Admit Date/Time:  01/30/2023  2:10 PM  Primary Diagnosis:  Right middle cerebral artery stroke [facility])  Hospital Problems: Principal Problem:   Right middle cerebral artery stroke [facility]) Active Problems:   Hyperlipidemia   Hypokalemia   Hx of seizure disorder    Expected Discharge Date: Expected Discharge Date: 02/07/23  Team Members Present: Physician leading conference: Dr. Elijah Birk Social Worker Present: Cecile Sheerer, LCSWA Nurse Present: Other (comment) Konrad Dolores RN) PT Present: Darrold Span, PT OT Present: Kearney Hard, OT SLP Present: Feliberto Gottron, SLP PPS Coordinator present : Fae Pippin, SLP     Current Status/Progress Goal Weekly Team Focus  Bowel/Bladder   Continent x2. Last bowel movement 10/7.   Remain continent.   Monitor for changes in bowel and bladder function.    Swallow/Nutrition/ Hydration   Eval Pending           ADL's   CGA/Supervision   mod I   self-care retraining, activity tolerance, L NMR    Mobility   eval pending           Communication   Eval Pending            Safety/Cognition/ Behavioral Observations  Eval Pending            Pain   Denies pain this shift.   Pain less than or equal to 2.   Assess pain q shift and prn.    Skin   Clean and intact. Old scab to right groin area.   No skin breakdown.  Assess skin q shift and prn.      Discharge Planning:  TBA. Per EMR, pt will discharge to home with support from her husband. SW will confirm there are no barriers to discharge.   Team Discussion: Patient post right MCA CVA ; post covid with  hypokalemia.   Patient on target to meet rehab goals: Evals pending; currently needs CGA to min assist for ADLs.Goals for discharge set for Mod I overall.  *See Care Plan and progress notes for long and short-term goals.   Revisions to Treatment Plan:  N/a   Teaching Needs: Safety, dietary modifications,medications, transfers and toileting, etc.   Current Barriers to Discharge: N/a  Possible Resolutions to Barriers: Family education scheduled 02/03/23.  Follow-up PCP Nelwyn Salisbury, MD in 2 weeks following discharge from rehab.  Follow-up in [facility] in 8 weeks following discharge from rehab, office to schedule an appointment      Medical Summary Current Status: medically complicated by hypertension, atrial fibrillation, hypokalemia, pain in L ribs/chest, malnutrition, and cognitiion  Barriers to Discharge: Cardiac Complications;Inadequate Nutritional Intake;Medical stability;Self-care education;Uncontrolled Pain;Electrolyte abnormality   Possible Resolutions to [employer]: titrate pain medications to minimum tolerated doses for function,replete potassium, monitor vitals and increase BP regimen as approrpiate   Continued Need for Acute Rehabilitation Level of Care: The patient requires daily medical management by a physician with specialized training in physical medicine and rehabilitation for the following reasons: Direction of a multidisciplinary physical rehabilitation program to maximize functional independence : Yes Medical management of patient stability  for increased activity during participation in an intensive rehabilitation regime.: Yes Analysis of laboratory values and/or radiology reports with any subsequent need for medication adjustment and/or medical intervention. : Yes   I attest that I was present, lead the team conference, and concur with the assessment and plan of the team.   Gwenyth Allegra 01/31/2023,  3:57 PM     Angelina Sheriff, DO 01/31/2023

## 2023-01-31 NOTE — Plan of Care (Signed)
  Problem: RH Swallowing Goal: LTG Pt will demonstrate functional change in swallow as evidenced by bedside/clinical objective assessment (SLP) Description: LTG: Patient will demonstrate functional change in swallow as evidenced by bedside/clinical objective assessment (SLP) Flowsheets (Taken 01/31/2023 1543) LTG: Patient will demonstrate functional change in swallow as evidenced by bedside/clinical objective assessment: Oral swallow   Problem: RH Expression Communication Goal: LTG Patient will increase speech intelligibility (SLP) Description: LTG: Patient will increase speech intelligibility at word/phrase/conversation level with cues, % of the time (SLP) Flowsheets (Taken 01/31/2023 1543) LTG: Patient will increase speech intelligibility (SLP): Modified Independent   Problem: RH Problem Solving Goal: LTG Patient will demonstrate problem solving for (SLP) Description: LTG:  Patient will demonstrate problem solving for basic/complex daily situations with cues  (SLP) Flowsheets (Taken 01/31/2023 1543) LTG: Patient will demonstrate problem solving for (SLP): Complex daily situations LTG Patient will demonstrate problem solving for: Supervision   Problem: RH Attention Goal: LTG Patient will demonstrate this level of attention during functional activites (SLP) Description: LTG:  Patient will will demonstrate this level of attention during functional activites (SLP) Flowsheets (Taken 01/31/2023 1543) Patient will demonstrate during cognitive/linguistic activities the attention type of: Sustained Patient will demonstrate this level of attention during cognitive/linguistic activities in: Controlled LTG: Patient will demonstrate this level of attention during cognitive/linguistic activities with assistance of (SLP): Supervision Number of minutes patient will demonstrate attention during cognitive/linguistic activities: 30

## 2023-01-31 NOTE — Progress Notes (Signed)
PROGRESS NOTE   Subjective/Complaints: No events overnight. Vital stable.  Medium bowel movement overnight, continent of bowel and bladder. A.m. labs with mild hypokalemia.  Blood glucose appears appropriate. Complaining of pain along left chest under pacemaker, but does not want patch or treatment at this time as it is only bothersome when moving.   ROS: Denies fevers, chills, N/V, abdominal pain, constipation, diarrhea, SOB, cough, chest pain, new weakness or paraesthesias.  Per HPI above.   Objective:   No results found. Recent Labs    01/29/23 1156 01/31/23 0545  WBC 7.6 5.2  HGB 14.4 13.0  HCT 43.6 39.2  PLT 242 223   Recent Labs    01/29/23 1156  NA 137  K 4.3  CL 100  CO2 24  GLUCOSE 110*  BUN 12  CREATININE 0.48  CALCIUM 8.9    Intake/Output Summary (Last 24 hours) at 01/31/2023 0928 Last data filed at 01/30/2023 1800 Gross per 24 hour  Intake 120 ml  Output --  Net 120 ml        Physical Exam: Vital Signs Blood pressure 125/79, pulse 70, temperature 98.3 F (36.8 C), resp. rate 16, height 5\' 5"  (1.651 m), weight 46.8 kg, SpO2 100%.   PE: Constitution: Appropriate appearance for age. No apparent distress. Underweight.  Resp: No respiratory distress. No accessory muscle usage. on RA and CTAB Cardio: Well perfused appearance.  No peripheral edema. +pacemaker Abdomen: Nondistended. Nontender.   Psych: Appropriate mood and affect. Neuro: AAOx4.  + mild cognitive deficits , delayed recall. Content approrpaite. Follows commands.  CN 2-12 intact + mild dysarthria  Neurologic Exam:   DTRs: Reflexes were 2+ in bilateral achilles, patella, biceps, BR and triceps. Babinsky: flexor responses b/l.   Hoffmans: negative b/l Sensory exam: revealed normal sensation in all dermatomal regions in bilateral upper extremities and bilateral lower extremities Motor exam: strength 5/5 throughout bilateral upper  extremities and with exception of bilateral lower extremities 4+/5 throughout Coordination: Fine motor coordination was normal.  No drift on exam.         Assessment/Plan: 1. Functional deficits which require 3+ hours per day of interdisciplinary therapy in a comprehensive inpatient rehab setting. Physiatrist is providing close team supervision and 24 hour management of active medical problems listed below. Physiatrist and rehab team continue to assess barriers to discharge/monitor patient progress toward functional and medical goals  Care Tool:  Bathing    Body parts bathed by patient: Right arm, Chest, Left arm, Front perineal area, Abdomen, Right upper leg, Buttocks, Left upper leg, Right lower leg, Left lower leg, Face         Bathing assist Assist Level: Contact Guard/Touching assist     Upper Body Dressing/Undressing Upper body dressing   What is the patient wearing?: Pull over shirt    Upper body assist Assist Level: Minimal Assistance - Patient > 75%    Lower Body Dressing/Undressing Lower body dressing      What is the patient wearing?: Underwear/pull up, Pants     Lower body assist Assist for lower body dressing: Minimal Assistance - Patient > 75%     Toileting Toileting    Toileting assist Assist for  toileting: Contact Guard/Touching assist     Transfers Chair/bed transfer  Transfers assist     Chair/bed transfer assist level: Contact Guard/Touching assist     Locomotion Ambulation   Ambulation assist              Walk 10 feet activity   Assist           Walk 50 feet activity   Assist           Walk 150 feet activity   Assist           Walk 10 feet on uneven surface  activity   Assist           Wheelchair     Assist               Wheelchair 50 feet with 2 turns activity    Assist            Wheelchair 150 feet activity     Assist          Blood pressure 125/79, pulse  70, temperature 98.3 F (36.8 C), resp. rate 16, height 5\' 5"  (1.651 m), weight 46.8 kg, SpO2 100%.  1. 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  Antithrombotics: -DVT/anticoagulation:  Pharmaceutical: Other (comment) Pradaxa             -antiplatelet therapy: N/A 3. Pain Management: Tylenol as needed   - 10/8: C/o L chest pain over ribs/under pacemaker, does not want topicals for tx  4. Mood/Behavior/Sleep: Provide emotional support             -antipsychotic agents: N/A 5. Neuropsych/cognition: This patient is capable of making decisions on her own behalf. 6. Skin/Wound Care: Routine skin checks 7. Fluids/Electrolytes/Nutrition: Routine in and outs with follow-up chemistries 8.  COVID infection.  Recent COVID infection diagnosed 9/25.  Completed Paxlovid course.  Follow-up testing +10/1 on retest patient asymptomatic.  Precautions completed 01/29/2023 9.  Atrial fibrillation/pacemaker.  Pradaxa.  Cardiac rate controlled   - Pace 60-70s; continue current medicaitons 10.  History of seizure.  Keppra 500 mg twice daily.  Followed by [name], NP at [facility] 11.  Hyperlipidemia.  Lipitor 12. Hypokalemia. Recheck labs - mild 3.4; replete 10 meq daily and recheck 10/11 13. Dysphagia. Dys 3/thin             -Continue SLP  14. HTN. Home meds norvasc and cozzar. Long term goal normotensive. Monitor with activity   - well controlled; monitor    01/31/2023    4:22 AM 01/30/2023    7:00 PM 01/30/2023    2:31 PM  Vitals with BMI  Height   5\' 5"   Weight   103 lbs 3 oz  BMI   17.17  Systolic 125 127   Diastolic 79 70   Pulse 70 69      LOS: 1 days A FACE TO FACE EVALUATION WAS PERFORMED  Mary Mercado 01/31/2023, 9:28 AM

## 2023-01-31 NOTE — Evaluation (Signed)
Speech Language Pathology Assessment and Plan  Patient Details  Name: Mary Mercado MRN: [id number] Date of Birth: [birth year]  SLP Diagnosis: Cognitive Impairments;Dysarthria;Dysphagia  Rehab Potential: Good ELOS: 7 days    Today's Date: 01/31/2023 SLP Individual Time: 1300-1400 SLP Individual Time Calculation (min): 60 min   Hospital Problem: Principal Problem:   Right middle cerebral artery stroke [facility]) Active Problems:   Hyperlipidemia   Hypokalemia   Hx of seizure disorder  Past Medical History:  Past Medical History:  Diagnosis Date   Allergy    CVA (cerebral vascular accident) (HCC) 05/2018, 01/09/20   Hyperlipidemia LDL goal <70    Hypertension    Pacemaker    Palpitations    Vertigo, benign positional    Past Surgical History:  Past Surgical History:  Procedure Laterality Date   AV NODE ABLATION N/A 03/30/2020   Procedure: AV NODE ABLATION;  Surgeon: Duke Salvia, MD;  Location: [facility] INVASIVE CV LAB;  Service: Cardiovascular;  Laterality: N/A;   AV NODE ABLATION N/A 03/30/2020   Procedure: AV NODE ABLATION;  Surgeon: Duke Salvia, MD;  Location: [facility] INVASIVE CV LAB;  Service: Cardiovascular;  Laterality: N/A;   CARDIOVERSION Left 10/31/2018   Procedure: CARDIOVERSION;  Surgeon: Elease Hashimoto Deloris Ping, MD;  Location: [facility] ENDOSCOPY;  Service: Cardiovascular;  Laterality: Left;   CARDIOVERSION N/A 01/08/2020   Procedure: CARDIOVERSION;  Surgeon: Parke Poisson, MD;  Location: [facility] ENDOSCOPY;  Service: Cardiovascular;  Laterality: N/A;   Hemilaminectomy and microdiskectomy at L4-5 on the left.  12/10/2003   IR CT HEAD LTD  01/24/2023   IR PERCUTANEOUS ART THROMBECTOMY/INFUSION INTRACRANIAL INC DIAG ANGIO  01/24/2023   IR US GUIDE VASC ACCESS RIGHT  01/24/2023   PACEMAKER IMPLANT N/A 03/30/2020   Procedure: PACEMAKER IMPLANT;  Surgeon: Duke Salvia, MD;  Location: [facility] INVASIVE CV LAB;  Service: Cardiovascular;  Laterality: N/A;   RADIOLOGY WITH ANESTHESIA N/A  01/24/2023   Procedure: IR WITH ANESTHESIA;  Surgeon: Radiologist, Medication, MD;  Location: [facility] OR;  Service: Radiology;  Laterality: N/A;   TEE WITHOUT CARDIOVERSION N/A 06/05/2018   Procedure: TRANSESOPHAGEAL ECHOCARDIOGRAM (TEE);  Surgeon: Lewayne Bunting, MD;  Location: [facility] ENDOSCOPY;  Service: Cardiovascular;  Laterality: N/A;  loop    Assessment / Plan / Recommendation Clinical Impression Patient is an 82 year old right-handed female with history of hyperlipidemia, hypertension, prior 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  Presented 01/24/2023 with acute onset of left-sided weakness and slurred speech.  Cranial CT scan negative for acute findings.  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  Underwent emergent thrombectomy 01/24/2023 per interventional radiology.  Follow-up CT showed beam hardening artifact at the level of the mid/anterior right temporal lobe no intracranial hemorrhage.   Therapy evaluations completed with recommendations for CIR. Patient admitted 01/30/23.  Patient demonstrates a mild dysarthria characterized by imprecise consonants due to an increased speech rate and decreased coordination of articulators. Patient's voice also appeared mildly hoarse. Patient was ~90% intelligible and required frequent water throughout conversation. 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   Patient's overall memory appeared intact for functional information but patient reports decreased problem solving and attention/concentration resulting in mental fatigue with mildly complex tasks such as balancing a  checkbook and organizing her pill box.    Patient observed with lunch meal of dys. 3 textures  with thin liquids via straw. Patient reports a history of swallowing deficits resulting in patient being very deliberate during PO intake regarding taking small bites/sips. Patient also reports she stays away from certain foods like salads and corn bread due to there ability to increase the likelihood that she will "get choked." Patient demonstrated mildly prolonged AP transit with both solids and liquids but without any overt coughing with only intermittent, subtle throat clearing observed. Patient also required cues to focus on mastication and reduce talking with a full oral cavity. Recommend patient continue current diet of Dys. 3 textures with thin liquids with SLP to f/u for tolerance and trials of regular textures as patient enjoys eating tortilla chips.   Patient would benefit from skilled SLP intervention to maximize her cognitive and swallowing function as well as her speech intelligibility prior to discharge.    Skilled Therapeutic Interventions          Administered a cognitive-linguistic evaluation and BSE, please see above for details.   SLP Assessment  Patient will need skilled Speech Lanaguage Pathology Services during CIR admission    Recommendations  SLP Diet Recommendations: Dysphagia 3 (Mech soft);Thin Liquid Administration via: Cup;Straw Medication Administration: Whole meds with puree Supervision: Patient able to self feed Compensations: Slow rate;Small sips/bites;Minimize environmental distractions Postural Changes and/or Swallow Maneuvers: Out of bed for meals Oral Care Recommendations: Oral care BID Recommendations for Other Services: Neuropsych consult Patient destination: Home Follow up Recommendations: Home Health SLP (Intermittent Supervision) Equipment Recommended: None recommended by SLP    SLP Frequency 1 to 3 out of 7 days   SLP Duration  SLP Intensity  SLP Treatment/Interventions 7 days  Minumum of 1-2 x/day, 30 to 90 minutes  Cognitive  remediation/compensation;Cueing hierarchy;Dysphagia/aspiration precaution training;Environmental controls;Functional tasks;Internal/external aids;Patient/family education;Speech/Language facilitation;Therapeutic Activities    Pain No/Denies Pain   SLP Evaluation Cognition Overall Cognitive Status: History of cognitive impairments - at baseline Arousal/Alertness: Awake/alert Orientation Level: Oriented X4 Memory: Appears intact Awareness: Appears intact Problem Solving: Impaired Problem Solving Impairment: Functional complex Safety/Judgment: Appears intact  Comprehension Auditory Comprehension Overall Auditory Comprehension: Appears within functional limits for tasks assessed Expression Expression Primary Mode of Expression: Verbal Verbal Expression Overall Verbal Expression: Appears within functional limits for tasks assessed Written Expression Dominant Hand: Right Written Expression: Not tested Oral Motor Oral Motor/Sensory Function Overall Oral Motor/Sensory Function: Within functional limits Motor Speech Overall Motor Speech: Impaired Respiration: Within functional limits Phonation: Normal Articulation: Impaired Level of Impairment: Sentence Intelligibility: Intelligible Motor Planning: Witnin functional limits Motor Speech Errors: Aware;Consistent Interfering Components: Premorbid status Effective Techniques: Pacing;Over-articulate;Slow rate;Increased vocal intensity  Care Tool Care Tool Cognition Ability to hear (with hearing aid or hearing appliances if normally used Ability to hear (with hearing aid or hearing appliances if normally used): 0. Adequate - no difficulty in normal conservation, social interaction, listening to TV   Expression of Ideas and Wants Expression of Ideas and Wants: 3. Some difficulty - exhibits some difficulty with expressing needs and ideas (e.g, some words or finishing thoughts) or speech is not clear   Understanding Verbal and Non-Verbal  Content Understanding Verbal and Non-Verbal Content: 4. Understands (complex and basic) - clear comprehension without cues or repetitions  Memory/Recall Ability Memory/Recall Ability : Current season;Location of own room;Staff names and faces;That he or she is in a hospital/hospital unit    Bedside Swallowing Assessment General Previous Swallow Assessment: 01/25/23: Recommended Dys.  3 textures with thin liquids Diet Prior to this Study: Dysphagia 3 (mechanical soft);Thin liquids (Level 0) Temperature Spikes Noted: No Respiratory Status: Room air History of Recent Intubation: Yes Total duration of intubation (days): 2 days Date extubated: 01/24/23 Behavior/Cognition: Alert;Cooperative Oral Cavity - Dentition: Adequate natural dentition Self-Feeding Abilities: Able to feed self Vision: Functional for self-feeding Patient Positioning: Upright in chair/Tumbleform Baseline Vocal Quality: Normal Volitional Cough: Strong Volitional Swallow: Able to elicit  Ice Chips Ice chips: Not tested Thin Liquid Thin Liquid: Within functional limits Presentation: Cup;Straw;Self Fed Nectar Thick Nectar Thick Liquid: Not tested Honey Thick Honey Thick Liquid: Not tested Puree Puree: Within functional limits Presentation: Self Fed;Spoon Solid Solid: Impaired Presentation: Self Fed Oral Phase Functional Implications: Prolonged oral transit Pharyngeal Phase Impairments: Throat Clearing - Delayed BSE Assessment Risk for Aspiration Impact on safety and function: Mild aspiration risk Other Related Risk Factors: History of dysphagia  Short Term Goals: Week 1: SLP Short Term Goal 1 (Week 1): STGs=LTGs due to ELOS  Refer to Care Plan for Long Term Goals  Recommendations for other services: Neuropsych  Discharge Criteria: Patient will be discharged from SLP if patient refuses treatment 3 consecutive times without medical reason, if treatment goals not met, if there is a change in medical status, if  patient makes no progress towards goals or if patient is discharged from hospital.  The above assessment, treatment plan, treatment alternatives and goals were discussed and mutually agreed upon: by patient and by family  Zetha Kuhar 01/31/2023, 3:41 PM

## 2023-02-01 DIAGNOSIS — I63511 Cerebral infarction due to unspecified occlusion or stenosis of right middle cerebral artery: Secondary | ICD-10-CM | POA: Diagnosis not present

## 2023-02-01 NOTE — Progress Notes (Signed)
Occupational Therapy Session Note  Patient Details  Name: Mary Mercado MRN: [id number] Date of Birth: Mar 07, 1941  Today's Date: 02/01/2023 OT Individual Time: 1017-1100 OT Individual Time Calculation (min): 43 min    Short Term Goals: Week 1:  OT Short Term Goal 1 (Week 1): LTG=STG 2/2 ELOS  Skilled Therapeutic Interventions/Progress Updates:    1:1 Pt received in the recliner. Pt navigated around the room with contact guard with rollator. Pt picked out clothing with min questioning cues. Pt ambulated to bathroom and sat on toilet to doff clothing with contact guard. Pt transitioned into the shower with contact guard. Pt showered sit to stand with use of grab bar with supervision with min cues for remaining on task. Pt ambulated back out of the shower to a BSC to dress. Pt able to dress from pile of folded clothing with extra time with contact guard for standing. Pt ambulated out to the sink and stood with close supervision to perform grooming. Pt returned to recliner and left with husband and SW discussing d/c.   Therapy Documentation Precautions:  Precautions Precautions: Fall Restrictions Weight Bearing Restrictions: No  Pain: No c/o pain in session   Therapy/Group: Individual Therapy  Roney Mans [facility] 02/01/2023, 1:22 PM

## 2023-02-01 NOTE — Progress Notes (Signed)
PROGRESS NOTE   Subjective/Complaints: No events overnight.  No acute complaints. Vital stable, labs stable. States she had something to discuss but forgot what it was.   ROS: Denies fevers, chills, N/V, abdominal pain, constipation, diarrhea, SOB, cough, chest pain, new weakness or paraesthesias.  Per HPI above.   Objective:   No results found. Recent Labs    01/29/23 1156 01/31/23 0545  WBC 7.6 5.2  HGB 14.4 13.0  HCT 43.6 39.2  PLT 242 223   Recent Labs    01/29/23 1156 01/31/23 0545  NA 137 138  K 4.3 3.4*  CL 100 101  CO2 24 27  GLUCOSE 110* 88  BUN 12 18  CREATININE 0.48 0.52  CALCIUM 8.9 8.5*    Intake/Output Summary (Last 24 hours) at 02/01/2023 0918 Last data filed at 02/01/2023 0740 Gross per 24 hour  Intake 240 ml  Output --  Net 240 ml        Physical Exam: Vital Signs Blood pressure (!) 142/82, pulse 70, temperature 98.3 F (36.8 C), temperature source Oral, resp. rate 18, height 5\' 5"  (1.651 m), weight 46.8 kg, SpO2 99%.   PE: Constitution: Appropriate appearance for age. No apparent distress. Underweight. Sitting in therapy gym,  Resp: No respiratory distress. No accessory muscle usage. on RA and CTAB Cardio: Well perfused appearance.  No peripheral edema. +pacemaker Abdomen: Nondistended. Nontender.   Psych: Appropriate mood and affect. Neuro: AAOx4.  + mild cognitive deficits , delayed recall.  - unchanged; functional CN 2-12 intact  Sensory exam: revealed normal sensation in all dermatomal regions in bilateral upper extremities and bilateral lower extremities Motor exam: strength 5/5 throughout bilateral upper extremities and with exception of bilateral lower extremities 4+/5 throughout  - antigravity and against resistance, SPV with walker Coordination: Fine motor coordination was normal.  No drift on exam.         Assessment/Plan: 1. Functional deficits which require 3+  hours per day of interdisciplinary therapy in a comprehensive inpatient rehab setting. Physiatrist is providing close team supervision and 24 hour management of active medical problems listed below. Physiatrist and rehab team continue to assess barriers to discharge/monitor patient progress toward functional and medical goals  Care Tool:  Bathing    Body parts bathed by patient: Right arm, Chest, Left arm, Front perineal area, Abdomen, Right upper leg, Buttocks, Left upper leg, Right lower leg, Left lower leg, Face         Bathing assist Assist Level: Contact Guard/Touching assist     Upper Body Dressing/Undressing Upper body dressing   What is the patient wearing?: Pull over shirt    Upper body assist Assist Level: Minimal Assistance - Patient > 75%    Lower Body Dressing/Undressing Lower body dressing      What is the patient wearing?: Underwear/pull up, Pants     Lower body assist Assist for lower body dressing: Minimal Assistance - Patient > 75%     Toileting Toileting    Toileting assist Assist for toileting: Contact Guard/Touching assist     Transfers Chair/bed transfer  Transfers assist     Chair/bed transfer assist level: Contact Guard/Touching assist     Locomotion  Ambulation   Ambulation assist      Assist level: Contact Guard/Touching assist Assistive device: Rollator Max distance: 200'   Walk 10 feet activity   Assist     Assist level: Contact Guard/Touching assist Assistive device: Rollator   Walk 50 feet activity   Assist    Assist level: Contact Guard/Touching assist Assistive device: Rollator    Walk 150 feet activity   Assist    Assist level: Contact Guard/Touching assist Assistive device: Rollator    Walk 10 feet on uneven surface  activity   Assist     Assist level: Contact Guard/Touching assist Assistive device: Rollator   Wheelchair     Assist Is the patient using a wheelchair?: No (pt ambulates  to/from room to gym)             Wheelchair 50 feet with 2 turns activity    Assist            Wheelchair 150 feet activity     Assist          Blood pressure (!) 142/82, pulse 70, temperature 98.3 F (36.8 C), temperature source Oral, resp. rate 18, height 5\' 5"  (1.651 m), weight 46.8 kg, SpO2 99%.  1. 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  Antithrombotics: -DVT/anticoagulation:  Pharmaceutical: Other (comment) Pradaxa             -antiplatelet therapy: N/A 3. Pain Management: Tylenol as needed   - 10/8: C/o L chest pain over ribs/under pacemaker, does not want topicals for tx  4. Mood/Behavior/Sleep: Provide emotional support             -antipsychotic agents: N/A 5. Neuropsych/cognition: This patient is capable of making decisions on her own behalf.   - Mild cog deficits, but functional and approrpaite  6. Skin/Wound Care: Routine skin checks 7. Fluids/Electrolytes/Nutrition: Routine in and outs with follow-up chemistries 8.  COVID infection.  Recent COVID infection diagnosed 9/25.  Completed Paxlovid course.  Follow-up testing +10/1 on retest patient asymptomatic.  Precautions completed 01/29/2023 9.  Atrial fibrillation/pacemaker.  Pradaxa.  Cardiac rate controlled   - Pace 60-70s; continue current medicaitons 10.  History of seizure.  Keppra 500 mg twice daily.  Followed by [name], NP at [facility] 11.  Hyperlipidemia.  Lipitor 12. Hypokalemia. Recheck labs - mild 3.4; replete 10 meq daily and recheck 10/11  13. Dysphagia. Dys 3/thin             -Continue SLP  Diet Orders (From admission, onward)     Start     Ordered   01/30/23 1432  DIET DYS 3 Room service appropriate? Yes with Assist;  Fluid consistency: Thin  Diet effective now       Question Answer Comment  Room service appropriate? Yes with Assist   Fluid consistency: Thin      01/30/23 1431            14. HTN. Home meds norvasc and cozzar. Long term goal normotensive. Monitor with activity   - well controlled; monitor  10-9: Some  mild hypertension intermittently, normotensive at other times.  No adjustments for now due to concern for overcorrection.  Treat if consistently greater than 140    02/01/2023    5:53 AM 01/31/2023    8:47 PM 01/31/2023    2:35 PM  Vitals with BMI  Systolic 142 152 469  Diastolic 82 91 76  Pulse 70 72 70     LOS: 2 days A FACE TO FACE EVALUATION WAS PERFORMED  Angelina Sheriff 02/01/2023, 9:18 AM

## 2023-02-01 NOTE — Care Management (Signed)
Inpatient Rehabilitation Center Individual Statement of Services  Patient Name:  Mary Mercado  Date:  02/01/2023  Welcome to the Inpatient Rehabilitation Center.  Our goal is to provide you with an individualized program based on your diagnosis and situation, designed to meet your specific needs.  With this comprehensive rehabilitation program, you will be expected to participate in at least 3 hours of rehabilitation therapies Monday-Friday, with modified therapy programming on the weekends.  Your rehabilitation program will include the following services:  Physical Therapy (PT), Occupational Therapy (OT), Speech Therapy (ST), 24 hour per day rehabilitation nursing, Therapeutic Recreaction (TR), Psychology, Neuropsychology, Care Coordinator, Rehabilitation Medicine, Nutrition Services, Pharmacy Services, and Other  Weekly team conferences will be held on Tuesdays to discuss your progress.  Your Inpatient Rehabilitation Care Coordinator will talk with you frequently to get your input and to update you on team discussions.  Team conferences with you and your family in attendance may also be held.  Expected length of stay: 7 days    Overall anticipated outcome: Independent with Assistive Device  Depending on your progress and recovery, your program may change. Your Inpatient Rehabilitation Care Coordinator will coordinate services and will keep you informed of any changes. Your Inpatient Rehabilitation Care Coordinator's name and contact numbers are listed  below.  The following services may also be recommended but are not provided by the Inpatient Rehabilitation Center:  Driving Evaluations Home Health Rehabiltiation Services Outpatient Rehabilitation Services Vocational Rehabilitation   Arrangements will be made to provide these services after discharge if needed.  Arrangements include referral to agencies that provide these services.  Your insurance has been verified to be:   Medicare A/B  Your primary doctor is:  Gershon Crane  Pertinent information will be shared with your doctor and your insurance company.  Inpatient Rehabilitation Care Coordinator:  Susie Cassette [phone] or ([phone]  Information discussed with and copy given to patient by: Gretchen Short, 02/01/2023, 10:31 AM

## 2023-02-01 NOTE — Progress Notes (Signed)
Patient ID: Mary Mercado, female   DOB: May 28, 1940, 82 y.o.   MRN: [id number]  SW met with pt and pt husband in room to complete assessment. SW discussed details of family edu. SW informed on HHA will be [employer] which is used with [facility].   SW faxed PT/OT/SLP referral to [facility] ([phone]/[phone]).  Cecile Sheerer, MSW, LCSWA Office: [phone] Cell: [phone] Fax: [phone]

## 2023-02-01 NOTE — Progress Notes (Signed)
Inpatient Rehabilitation Care Coordinator Assessment and Plan Patient Details  Name: Mary Mercado MRN: [id number] Date of Birth: 15-Nov-1940  Today's Date: 02/01/2023  Hospital Problems: Principal Problem:   Right middle cerebral artery stroke [facility]) Active Problems:   Hyperlipidemia   Hypokalemia   Hx of seizure disorder  Past Medical History:  Past Medical History:  Diagnosis Date   Allergy    CVA (cerebral vascular accident) (HCC) 05/2018, 01/09/20   Hyperlipidemia LDL goal <70    Hypertension    Pacemaker    Palpitations    Vertigo, benign positional    Past Surgical History:  Past Surgical History:  Procedure Laterality Date   AV NODE ABLATION N/A 03/30/2020   Procedure: AV NODE ABLATION;  Surgeon: Duke Salvia, MD;  Location: [facility] INVASIVE CV LAB;  Service: Cardiovascular;  Laterality: N/A;   AV NODE ABLATION N/A 03/30/2020   Procedure: AV NODE ABLATION;  Surgeon: Duke Salvia, MD;  Location: [facility] INVASIVE CV LAB;  Service: Cardiovascular;  Laterality: N/A;   CARDIOVERSION Left 10/31/2018   Procedure: CARDIOVERSION;  Surgeon: Elease Hashimoto Deloris Ping, MD;  Location: [facility] ENDOSCOPY;  Service: Cardiovascular;  Laterality: Left;   CARDIOVERSION N/A 01/08/2020   Procedure: CARDIOVERSION;  Surgeon: Parke Poisson, MD;  Location: [facility] ENDOSCOPY;  Service: Cardiovascular;  Laterality: N/A;   Hemilaminectomy and microdiskectomy at L4-5 on the left.  12/10/2003   IR CT HEAD LTD  01/24/2023   IR PERCUTANEOUS ART THROMBECTOMY/INFUSION INTRACRANIAL INC DIAG ANGIO  01/24/2023   IR US GUIDE VASC ACCESS RIGHT  01/24/2023   PACEMAKER IMPLANT N/A 03/30/2020   Procedure: PACEMAKER IMPLANT;  Surgeon: Duke Salvia, MD;  Location: [facility] INVASIVE CV LAB;  Service: Cardiovascular;  Laterality: N/A;   RADIOLOGY WITH ANESTHESIA N/A 01/24/2023   Procedure: IR WITH ANESTHESIA;  Surgeon: Radiologist, Medication, MD;  Location: [facility] OR;  Service: Radiology;  Laterality: N/A;   TEE WITHOUT CARDIOVERSION  N/A 06/05/2018   Procedure: TRANSESOPHAGEAL ECHOCARDIOGRAM (TEE);  Surgeon: Lewayne Bunting, MD;  Location: [facility] ENDOSCOPY;  Service: Cardiovascular;  Laterality: N/A;  loop   Social History:  reports that she has never smoked. She has never used smokeless tobacco. She reports that she does not drink alcohol and does not use drugs.  Family / Support Systems Marital Status: Married How Long?: 63 years Patient Roles: Spouse, Parent Spouse/Significant Other: [name] (husband) [phone] Children: 2 daughters- Rosey Bath (lives in [place]) and Misty Stanley (lives in [place]) Other Supports: PRN support from daughters Anticipated Caregiver: Husband Ability/Limitations of Caregiver: Pt and husband live in independent living facility at [facility]. Pt husband is primary caregiver, and drives patient to all appointments. Caregiver Availability: 24/7 Family Dynamics: Pt lives with husband in independent living facility.  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   Abuse/Neglect Abuse/Neglect Assessment Can Be Completed: Yes Physical Abuse: Denies Verbal Abuse: Denies Sexual Abuse: Denies Exploitation of patient/patient's resources: Denies Self-Neglect: Denies  Patient response to: Social Isolation - How often do you feel lonely or isolated from those around you?: Never  Emotional Status Pt's affect, behavior and adjustment status: Pt in good spirits at time of visit Recent Psychosocial Issues: Denies  Psychiatric History: Denies Substance Abuse History: Denies  Patient  / Family Perceptions, Expectations & Goals Pt/Family understanding of illness & functional limitations: Pt and family have a general understanding of pt care needs Premorbid pt/family roles/activities: Independent Anticipated changes in roles/activities/participation: Assistance/Contact Gaurd with ADLs/IADLs Pt/family expectations/goals: pt goal is to work on Occupational psychologist and get walking good."  [employer]: None Premorbid Home Care/DME Agencies: None Transportation available at discharge: Husband Is the patient able to respond to transportation needs?: Yes In the past 12 months, has lack of transportation kept you from medical appointments or from getting medications?: No In the past 12 months, has lack of transportation kept you from meetings, work, or from getting things needed for daily living?: No Resource referrals recommended: Neuropsychology  Discharge Planning Living Arrangements: Spouse/significant other Support Systems: Spouse/significant other Type of Residence: Water engineer Resources: Electrical engineer Resources: Restaurant manager, fast food Screen Referred: No Living Expenses: Rent Money Management: Spouse Does the patient have any problems obtaining your medications?: No Home Management: Pt and husband both manage home care needs. ILF will provide atleast one meal per day, otherwise, pt husband prepares light meals. Patient/Family Preliminary Plans: No changes Care Coordinator Barriers to Discharge: Decreased caregiver support, Lack of/limited family support Care Coordinator Anticipated Follow Up Needs: HH/OP Expected length of stay: D/c 10/15  Clinical Impression SW met with pt and pt husband in room to introduce self, explain role, and discuss discharge process. Pt is not a Cytogeneticist. DME: rollator, walk in shower with grab bars, and shower chair.   Kameran Mcneese A Myrtis Maille 02/01/2023, 2:45 PM

## 2023-02-01 NOTE — Progress Notes (Signed)
Physical Therapy Session Note  Patient Details  Name: Mary Mercado MRN: [id number] Date of Birth: [birth year]  Today's Date: 02/01/2023 PT Individual Time: 0803-0845 PT Individual Time Calculation (min): 42 min   Short Term Goals: Week 1:  PT Short Term Goal 1 (Week 1): STG = LTG due to ELOS  Skilled Therapeutic Interventions/Progress Updates:    Pt presents in room in recliner, agreeable to PT, denies pain. Session focused on gait training and NMR for dynamic standing balance and single limb stability. Pt completes transfers with rollator and without with CGA/close supervision throughout session.  Pt ambulates from room to day room ~150' with rollator supervision demonstrates minimal path deviation and good negotiation of obstacles in hallway. Pt ambulates with rollator around 5 cones x2 trials with supervision demonstrating great obstacle negotiation with min verbal cues. Pt ambulates without device with CGA/min assist short step length and BUEs in high guard 30' to promote improved dynamic postural stability.  Pt then completes NMR for single limb stability and dynamic postural balance including: - forward/lateral/backwards stepping BLE x10 each - step taps 2x20 alternating BLE  Pt verbose throughout session, requires increased time to redirect to task.  Pt returns to room ambulating with rollator and remains seated in recliner with all needs within reach, call light in place and chair alarm donned and activated at end of session.   Therapy Documentation Precautions:  Precautions Precautions: Fall Restrictions Weight Bearing Restrictions: No   Therapy/Group: Individual Therapy  Edwin Cap PT, DPT 02/01/2023, 11:40 AM

## 2023-02-01 NOTE — Progress Notes (Signed)
Speech Language Pathology Daily Session Note  Patient Details  Name: Mary Mercado MRN: [id number] Date of Birth: [birth year]  Today's Date: 02/01/2023 SLP Individual Time: 1445-1545 SLP Individual Time Calculation (min): 60 min  Short Term Goals: Week 1: SLP Short Term Goal 1 (Week 1): STGs=LTGs due to ELOS  Skilled Therapeutic Interventions: SLP conducted skilled therapy session targeting dysphagia management, voice, and cognitive retraining goals. Patient highly tangential this date, requiring min-mod verbal cues to redirect attention to task. SLP assessed tolerance of upgraded bolus trials with regular solids. Patient notably took very small bites, claiming she did not want to choke, but took 10 minutes to eat one half of a graham cracker. Recommend continuation of current diet at this time. Will continue with trials. During cognitive tasks, patient identified errors in a BID pill box given min-modA overall. At end of session, patient requested assistance to the bathroom where she was continent of bowel and bladder. Patient was left in lowered bed with call bell in reach and bed alarm set. SLP will continue to target goals per plan of care.       Pain Pain Assessment Pain Scale: 0-10 Pain Score: 0-No pain  Therapy/Group: Individual Therapy  [name], M.A., CCC-SLP  [name] 02/01/2023, 3:49 PM

## 2023-02-01 NOTE — Progress Notes (Signed)
Physical Therapy Session Note  Patient Details  Name: Mary Mercado MRN: [id number] Date of Birth: Nov 07, 1940  Today's Date: 02/01/2023 PT Individual Time: 0900-0955 PT Individual Time Calculation (min): 55 min   Short Term Goals: Week 1:  PT Short Term Goal 1 (Week 1): STG = LTG due to ELOS  Skilled Therapeutic Interventions/Progress Updates:       Pt sitting in recliner with RN present for medications. Pt agreeable to therapy treatment. Pt hyperverbose, recalls the events related to her CVA as well as her current medications. Gentle redirection to begin mobilizing in therapy session. Sit<>Stand to rollator with supervision. She ambulates with supervision from her room to day room rehab gym - no LOB or knee buckling observed, fair gait speed.   Instructed in using her rollator - she covered 698ft in the 6 minutes - patient internally distracted during testing, she probably would have performed better had she stay focused on task. Gait speed 0.58 m/s, indicative of [employer].   Pt completed 5xSTS from lowered mat table height with no UE support (hands out in front of her). Completed in 23 seconds. Scores > 15 seconds indicate increased falls risk.   Pt instructed in dynamic gait training with no AD to challenge stability and balance. Ambulated 175ft with CGA and no AD around nurses station on level ground, working on natural arm swing, gait speed, and stride length. Upgraded task by having her hold a 2lb small med ball in both UE while ambulating the same distance - instructed in her passing the ball back and forth b/w therapist or tossing the small med ball to herself while ambulating. CGA to light minA for stability.    Gait speed with no AD - 0.54 m/s.  Gait speed with rollator - 0.58 m/s  Pt returned to her room and concluded session seated in recliner with seat belt alarm on, call bell within reach, her husband at the bedside. All needs met.     Therapy Documentation Precautions:  Precautions Precautions: Fall Restrictions Weight Bearing Restrictions: No General:     Therapy/Group: Individual Therapy  Pesach Frisch P Jazzmin Newbold 02/01/2023, 9:05 AM

## 2023-02-02 DIAGNOSIS — I69391 Dysphagia following cerebral infarction: Secondary | ICD-10-CM

## 2023-02-02 DIAGNOSIS — I69318 Other symptoms and signs involving cognitive functions following cerebral infarction: Secondary | ICD-10-CM

## 2023-02-02 DIAGNOSIS — R454 Irritability and anger: Secondary | ICD-10-CM

## 2023-02-02 DIAGNOSIS — Z950 Presence of cardiac pacemaker: Secondary | ICD-10-CM

## 2023-02-02 MED ORDER — LIDOCAINE 5 % EX PTCH: 1.0000 | MEDICATED_PATCH | Freq: Every day | CUTANEOUS | Status: DC | PRN

## 2023-02-02 MED ORDER — AMLODIPINE BESYLATE 2.5 MG PO TABS
2.5000 mg | ORAL_TABLET | Freq: Every day | ORAL | Status: DC
  Administered 2023-02-02 – 2023-02-03 (×2): 2.5 mg via ORAL
  Filled 2023-02-02 (×2): qty 1

## 2023-02-02 NOTE — Progress Notes (Signed)
Physical Therapy Session Note  Patient Details  Name: Mary Mercado MRN: [id number] Date of Birth: May 18, 1940  Today's Date: 02/02/2023 PT Individual Time: 1420-1534 PT Individual Time Calculation (min): 74 min   Short Term Goals: Week 1:  PT Short Term Goal 1 (Week 1): STG = LTG due to ELOS  Skilled Therapeutic Interventions/Progress Updates:    Pt presents in room in recliner, agreeable to PT, denies pain at this time. Session focused on NMR to promote BUE/BLE coordinatoin, gait training for navigating around obstacles in busy hallway.  Pt ambulates from room to day room supervision. Pt participates with dance group ~30 minutes in sitting/standing to facilitate improved BUE/BLE coordination and dynamic standing balance. Pt standing for 2 minutes with pt completing standing balance with and without UE support to compete BUE/BLE movements, CGA. Pt becomes less engaged during session, reports to therapist that she doesn't feel good with loud music. Pt ambulates from day room to main gym for remainder of session.  Pt completes standing NMR to promote BUE/BLE coordination, 1# ankle weights donned, no UE support including: - standing marches x20 - sit<>stands on airex pad x10 - standing marches on airex pad x20 - unilateral opposite UE/LE flexion on airex pad x10 BLE - alternating opposite UE/LE flexion on airex pad x10 BLE  Pt provided with seated rest breaks between all gait trials and exercises to promote energy conservation and quality with tasks.  Pt returns to room ambulating with distant supervision with rollator, ambulates to bathroom managing door with supervision and positions for transfer with supervision. Pt completes 3/3 toileting tasks with supervision. Pt ambulates to sink and completes hand hygiene then returning to recliner with rollator, remains seated in recliner with all needs within reach, cal light in place and chair alarm donned and activated at end of  session.   Therapy Documentation Precautions:  Precautions Precautions: Fall Restrictions Weight Bearing Restrictions: No    Therapy/Group: Individual Therapy  Edwin Cap PT, DPT 02/02/2023, 3:53 PM

## 2023-02-02 NOTE — Progress Notes (Signed)
PROGRESS NOTE   Subjective/Complaints: No events overnight.  No acute complaints. Vital stable, labs stable. Multiple urinary movements, Bms overnight.  Patient continues to complain of intermittent shooting pains under her left ribs, just distal to her pacemaker.  Again emphasizes that this is unpredictable and she does not want a continuous medication for treatment.  She is agreeable now to lidocaine patch as needed.  Answered several questions from family today, including medication changes and antihypertensives since her stroke.  Discussed need to avoid overcorrection of hypertension in the postacute.,  So slow up titration of medications as needed.  Patient and family understanding that she will likely not go home on same medications that she was admitted with.  Also discussed husband's concern for mood changes with patient poststroke, specifically her needing things to get done "right now".  Discussed possibilities of this being related to memory difficulty and her not wanting to forget a task before it is completed, versus irritability due to poststroke mood changes including possible anxiety or depression.  Patient denies any additional symptoms, and has been emphasizes that she was tried on SSRI in the past and she developed severe side effects.  Do not wish to retrial anything in this medication class.  ROS: Denies fevers, chills, N/V, abdominal pain, constipation, diarrhea, SOB, cough, chest pain, new weakness or paraesthesias.  Per HPI above.   Objective:   No results found. Recent Labs    01/31/23 0545  WBC 5.2  HGB 13.0  HCT 39.2  PLT 223   Recent Labs    01/31/23 0545  NA 138  K 3.4*  CL 101  CO2 27  GLUCOSE 88  BUN 18  CREATININE 0.52  CALCIUM 8.5*    Intake/Output Summary (Last 24 hours) at 02/02/2023 1017 Last data filed at 02/01/2023 1815 Gross per 24 hour  Intake 720 ml  Output --  Net 720 ml         Physical Exam: Vital Signs Blood pressure (!) 143/83, pulse 70, temperature 97.6 F (36.4 C), resp. rate 16, height 5\' 5"  (1.651 m), weight 46.8 kg, SpO2 100%.   PE: Constitution: Appropriate appearance for age. No apparent distress. Underweight.  Eating breakfast. Resp: No respiratory distress. No accessory muscle usage. on RA and CTAB Cardio: Well perfused appearance.  +1 right lower extremity edema.  +pacemaker Abdomen: Nondistended. Nontender.   Psych: Appropriate mood and affect. Neuro: AAOx4.  + Moderate cognitive deficits , delayed recall.  - unchanged; does repeat herself occasionally CN 2-12 intact  Sensory exam: revealed normal sensation in all dermatomal regions in bilateral upper extremities and bilateral lower extremities Motor exam: strength 5/5 throughout bilateral upper extremities and with exception of bilateral lower extremities 4+/5 throughout  - antigravity and against resistance Coordination: Fine motor coordination was normal.  No drift on exam.         Assessment/Plan: 1. Functional deficits which require 3+ hours per day of interdisciplinary therapy in a comprehensive inpatient rehab setting. Physiatrist is providing close team supervision and 24 hour management of active medical problems listed below. Physiatrist and rehab team continue to assess barriers to discharge/monitor patient progress toward functional and medical goals  Care Tool:  Bathing    Body parts bathed by patient: Right arm, Chest, Left arm, Front perineal area, Abdomen, Right upper leg, Buttocks, Left upper leg, Right lower leg, Left lower leg, Face         Bathing assist Assist Level: Supervision/Verbal cueing     Upper Body Dressing/Undressing Upper body dressing   What is the patient wearing?: Pull over shirt (x2 (under tank top))    Upper body assist Assist Level: Supervision/Verbal cueing    Lower Body Dressing/Undressing Lower body dressing      What is the  patient wearing?: Underwear/pull up, Pants     Lower body assist Assist for lower body dressing: Contact Guard/Touching assist     Toileting Toileting    Toileting assist Assist for toileting: Contact Guard/Touching assist     Transfers Chair/bed transfer  Transfers assist     Chair/bed transfer assist level: Contact Guard/Touching assist     Locomotion Ambulation   Ambulation assist      Assist level: Contact Guard/Touching assist Assistive device: Rollator Max distance: 200'   Walk 10 feet activity   Assist     Assist level: Contact Guard/Touching assist Assistive device: Rollator   Walk 50 feet activity   Assist    Assist level: Contact Guard/Touching assist Assistive device: Rollator    Walk 150 feet activity   Assist    Assist level: Contact Guard/Touching assist Assistive device: Rollator    Walk 10 feet on uneven surface  activity   Assist     Assist level: Contact Guard/Touching assist Assistive device: Rollator   Wheelchair     Assist Is the patient using a wheelchair?: No (pt ambulates to/from room to gym)             Wheelchair 50 feet with 2 turns activity    Assist            Wheelchair 150 feet activity     Assist          Blood pressure (!) 143/83, pulse 70, temperature 97.6 F (36.4 C), resp. rate 16, height 5\' 5"  (1.651 m), weight 46.8 kg, SpO2 100%.  1. 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  Antithrombotics: -DVT/anticoagulation:  Pharmaceutical: Other (comment) Pradaxa             -antiplatelet therapy: N/A 3. Pain Management: Tylenol as needed   - 10/8: C/o L chest pain over ribs/under pacemaker, does not want topicals  for tx  10/10: As needed lidocaine patch for left chest pain  4. Mood/Behavior/Sleep: Provide emotional support             -antipsychotic agents: N/A  -10-10: Has been noticing some personality changes and the patient Po stroke, specifically that she has irritability and a sense of urgency regarding any requested task.  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  Did discuss possibility that this is more related to her memory.  Also possible side effect from Keppra, although she has been  tolerating this since 06-2020.  5. Neuropsych/cognition: This patient is capable of making decisions on her own behalf.   - Mild cog deficits, but functional and approrpaite  -May benefit from neuropsych consult due to cognitive and irritability changes  6. Skin/Wound Care: Routine skin checks 7. Fluids/Electrolytes/Nutrition: Routine in and outs with follow-up chemistries 8.  COVID infection.  Recent COVID infection diagnosed 9/25.  Completed Paxlovid course.  Follow-up testing +10/1 on retest patient asymptomatic.  Precautions completed 01/29/2023 9.  Atrial fibrillation/pacemaker.  Pradaxa.  Cardiac rate controlled   - Pace 60-70s; continue current medicaitons 10.  History of seizure.  Keppra 500 mg twice daily.  Followed by [name], NP at [facility] 11.  Hyperlipidemia.  Lipitor 12. Hypokalemia. Recheck labs - mild 3.4; replete 10 meq daily and recheck 10/11  13. Dysphagia. Dys 3/thin             -Continue SLP  Diet Orders (From admission, onward)     Start     Ordered   01/30/23 1432  DIET DYS 3 Room service appropriate? Yes with Assist; Fluid consistency: Thin  Diet effective now       Question Answer Comment  Room service appropriate? Yes with Assist   Fluid consistency: Thin      01/30/23 1431            14. HTN. Home meds norvasc and cozzar. Long term goal normotensive. Monitor with activity   - well  controlled; monitor  10-9: Some mild hypertension intermittently, normotensive at other times.  No adjustments for now due to concern for overcorrection.  Treat if consistently greater than 140 10/10: Educated family on need to avoid overcorrection in the postacute period.  Does have consistent a.m. hypertension, will resume Norvasc at low-dose 2.5 mg daily and monitor 2 to 3 days.    02/02/2023    5:05 AM 02/01/2023    8:09 PM 02/01/2023    2:08 PM  Vitals with BMI  Systolic 143 135 782  Diastolic 83 80 71  Pulse 70 69 70     LOS: 3 days A FACE TO FACE EVALUATION WAS PERFORMED  Angelina Sheriff 02/02/2023, 10:17 AM

## 2023-02-02 NOTE — Progress Notes (Signed)
Speech Language Pathology Daily Session Note  Patient Details  Name: Hailee Hollick MRN: [id number] Date of Birth: [birth year]  Today's Date: 02/02/2023 SLP Individual Time: 1015-1107 SLP Individual Time Calculation (min): 52 min  Short Term Goals: Week 1: SLP Short Term Goal 1 (Week 1): STGs=LTGs due to ELOS  Skilled Therapeutic Interventions: SLP conducted skilled therapy session targeting dysphagia management and cognitive retraining goals. SLP assessed tolerance of upgraded regular solid bolus trials with patient exhibiting improvements to mastication swiftness and quick clearance of the oral cavity. Patient feels she is not quite ready for diet upgrade at this time, thus recommend continuation of Dys3/thin liquid diet. SLP will continue to work towards upgrade to regular/thin liquid diet as patient tolerates and feels ready. During cognitive map scanning task, patient required minA to switch cognitive sets but then was supervisionA for all map related questions. During financial calculations, patient solved all provided mildly to moderately complex financial problems related to balancing a checkbook with 100% accuracy given supervisionA, also demonstrating intact self-monitoring and self-correcting skills. At end of session, patient requested assistance to the bathroom where she was continent of bladder. Patient was left in chair with call bell in reach and chair alarm set. SLP will continue to target goals per plan of care.       Pain Pain Assessment Pain Scale: 0-10 Pain Score: 0-No pain  Therapy/Group: Individual Therapy  [name], M.A., CCC-SLP  [name] 02/02/2023, 1:36 PM

## 2023-02-02 NOTE — Progress Notes (Signed)
Patient ID: Mary Mercado, female   DOB: [birth year], 82 y.o.   MRN: [id number]  SW called [facility] ([phone]/[phone]) to discuss if PT/OT/SLP referral received. Reports will check and follow-up.  *SW received message confirming referral received. No further follow-up needed. SW will fax d/c summaries once available.   Cecile Sheerer, MSW, LCSWA Office: [phone] Cell: [phone] Fax: [phone]

## 2023-02-02 NOTE — IPOC Note (Signed)
Overall Plan of Care [facility]) Patient Details Name: Mary Mercado MRN: [id number] DOB: 1940-08-30  Admitting Diagnosis: Right middle cerebral artery stroke [facility])  Hospital Problems: Principal Problem:   Right middle cerebral artery stroke (HCC) Active Problems:   Hyperlipidemia   Hypokalemia   Hx of seizure disorder     Functional Problem List: Nursing Safety, Bowel, Endurance, Medication Management  PT Balance, Endurance, Motor, Perception  OT Balance, Endurance, Motor, Cognition, Perception, Safety  SLP Cognition  TR         Basic ADL's: OT Bathing, Dressing, Toileting     Advanced  ADL's: OT       Transfers: PT Bed Mobility, Bed to Chair, Lobbyist, Technical brewer: PT Ambulation, Stairs     Additional Impairments: OT Fuctional Use of Upper Extremity  SLP Swallowing, Social Cognition, Communication expression Problem Solving, Attention  TR      Anticipated Outcomes Item Anticipated Outcome  Self Feeding mod I  Swallowing  Mod I   Basic self-care  mod I  Toileting  mod I   Bathroom Transfers mod I  Bowel/Bladder  manage bowel w mod I assist  Transfers  modI  Locomotion  modI ambulatory  Communication  Mod I  Cognition  Supervision  Pain  n/a  Safety/Judgment  manage w cues   Therapy Plan: PT Intensity: Minimum of 1-2 x/day ,45 to 90 minutes PT Frequency: 5 out of 7 days PT Duration Estimated Length of Stay: 7 days OT Intensity: Minimum of 1-2 x/day, 45 to 90 minutes OT Frequency: 5 out of 7 days OT Duration/Estimated Length of Stay: 7/10 days SLP Intensity: Minumum of 1-2 x/day, 30 to 90 minutes SLP Frequency: 1 to 3 out of 7 days SLP Duration/Estimated Length of Stay: 7 days   Team Interventions: Nursing Interventions Medication Management, Discharge Planning, Bowel Management, Disease Management/Prevention, Patient/Family Education, Dysphagia/Aspiration Precaution Training  PT interventions  Ambulation/gait training, Disease management/prevention, Stair training, Visual/perceptual remediation/compensation, Pain management, Warden/ranger, DME/adaptive equipment instruction, Patient/family education, Therapeutic Activities, Cognitive remediation/compensation, Therapeutic Exercise, Community reintegration, Functional mobility training, UE/LE Strength taining/ROM, Discharge planning, Neuromuscular re-education, UE/LE Coordination activities  OT Interventions Warden/ranger, Cognitive remediation/compensation, Community reintegration, Discharge planning, Disease mangement/prevention, DME/adaptive equipment instruction, Functional electrical stimulation, Functional mobility training, Neuromuscular re-education, Pain management, Patient/family education, Psychosocial support, Self Care/advanced ADL retraining, Skin care/wound managment, Splinting/orthotics, Therapeutic Activities, Therapeutic Exercise, UE/LE Strength taining/ROM, UE/LE Coordination activities, Visual/perceptual remediation/compensation, Wheelchair propulsion/positioning  SLP Interventions Cognitive remediation/compensation, Financial trader, Dysphagia/aspiration precaution training, Environmental controls, Functional tasks, Internal/external aids, Patient/family education, Speech/Language facilitation, Therapeutic Activities  TR Interventions    SW/CM Interventions Discharge Planning, Psychosocial Support, Patient/Family Education   Barriers to Discharge MD  Medical stability, Home enviroment access/loayout, Lack of/limited family support, and cognitive deficits  Nursing Decreased caregiver support, Home environment access/layout 1 level elevator at [facility]F ([facility]) goes to dining room for 1 meal/day, sits to shower, manages meds with husband,short gait to bathroom with pt agreeable to longer trial after BM, short shuffling steps with poor ability to correct, cues for upright posture  PT Home environment  access/layout impaired endurance for mobility in ILF  OT      SLP      SW Decreased caregiver support, Lack of/limited family support     Team Discharge Planning: Destination: PT-Home ,OT- Home , SLP-Home Projected Follow-up: PT-Outpatient PT, OT-  24 hour supervision/assistance, Outpatient OT, SLP-Home Health SLP (Intermittent Supervision) Projected Equipment Needs: PT-None recommended by PT, OT- To  be determined, SLP-None recommended by SLP Equipment Details: PT-owns rollator, OT-  Patient/family involved in discharge planning: PT- Family member/caregiver, Patient,  OT-Patient, SLP-Patient, Family member/caregiver  MD ELOS: 10-14 days Medical Rehab Prognosis:  Good Assessment: The patient has been admitted for CIR therapies with the diagnosis of R MCA CVA. The team will be addressing functional mobility, strength, stamina, balance, safety, adaptive techniques and equipment, self-care, bowel and bladder mgt, patient and caregiver education. Goals have been set at [facility] PT, OT, and SLP. Anticipated discharge destination is home.       See Team Conference Notes for weekly updates to the plan of care

## 2023-02-02 NOTE — Progress Notes (Signed)
Occupational Therapy Session Note  Patient Details  Name: Mary Mercado MRN: [id number] Date of Birth: [birth year]  Today's Date: 02/02/2023 Session 1 OT Individual Time: 5188-4166 OT Individual Time Calculation (min): 40 min    Session 2 OT Individual Time: 0630-1601 OT Individual Time Calculation (min): 43 min    Short Term Goals: Week 1:  OT Short Term Goal 1 (Week 1): LTG=STG 2/2 ELOS  Skilled Therapeutic Interventions/Progress Updates:    Session 1 Pt greeted seated in recliner and requesting to go to the bathroom. Pt ambulated with rolator and close supervision. Pt voided bowel and bladder then completed hygiene with CGA. Pt needed min A to doff clothing. Bathing completed at shower level with CGA when standing to wash buttocks. Pt demonstrated good safety awareness throughout session. Increased time and CGA for dressing tasks including sit<>stands. Pt left seated at the sink with nurse tech present to blow dry her hair.  Denies pain  Session 2 Pt greeted seated in recliner with spouse present and agreeable to OT treatment session. Pt ambulated to bathroom w/ RW and CGA. Pt able to manage clothing and sit on commode fo void bladder. Pt completed clothing management and peri-care with supervision. Pt stood at the sink to wash hands with close supervision. Pt ambulated to the gym w/ rollator and close supervision. L hand FMC with graded peg board task using small pegs to place in puzzle, then remove pegs with tweezers to increase difficulty. There-ex with 2 sets of 10 chest press and bicep curls. Pt ambulated back to room and left seated in recliner with alarm on, spouse present, and needs met.   Therapy Documentation Precautions:  Precautions Precautions: Fall Restrictions Weight Bearing Restrictions: No Pain: Pain Assessment Pain Scale: 0-10 Pain Score: 0-No painDenies pain   Therapy/Group: Individual Therapy  Mal Amabile 02/02/2023, 1:16 PM

## 2023-02-03 ENCOUNTER — Inpatient Hospital Stay (HOSPITAL_COMMUNITY): Payer: Medicare Other

## 2023-02-03 DIAGNOSIS — F4320 Adjustment disorder, unspecified: Secondary | ICD-10-CM

## 2023-02-03 DIAGNOSIS — I63511 Cerebral infarction due to unspecified occlusion or stenosis of right middle cerebral artery: Secondary | ICD-10-CM | POA: Diagnosis not present

## 2023-02-03 DIAGNOSIS — R600 Localized edema: Secondary | ICD-10-CM

## 2023-02-03 LAB — BASIC METABOLIC PANEL
Anion gap: 7 (ref 5–15)
BUN: 16 mg/dL (ref 8–23)
CO2: 26 mmol/L (ref 22–32)
Calcium: 9.1 mg/dL (ref 8.9–10.3)
Chloride: 104 mmol/L (ref 98–111)
Creatinine, Ser: 0.59 mg/dL (ref 0.44–1.00)
GFR, Estimated: >60 mL/min (ref >60)
Glucose, Bld: 101 mg/dL — ABNORMAL HIGH (ref 70–99)
Potassium: 5 mmol/L (ref 3.5–5.1)
Sodium: 137 mmol/L (ref 135–145)

## 2023-02-03 MED ORDER — METOPROLOL TARTRATE 12.5 MG HALF TABLET
12.5000 mg | ORAL_TABLET | Freq: Every day | ORAL | Status: DC
  Administered 2023-02-03 – 2023-02-06 (×4): 12.5 mg via ORAL
  Filled 2023-02-03 (×4): qty 1

## 2023-02-03 NOTE — Consult Note (Signed)
Neuropsychological Consultation Comprehensive Inpatient Rehab   Patient:   Mary Mercado   DOB:   1940/09/23  MR Number:  [id number]  Location:  [facility] [address] CENTER A [address] [place] [address] Dept: [phone] Loc: [phone]           Date of Service:   02/03/2023  Start Time:   8 AM End Time:   9 AM  Provider/Observer:  Arley Phenix, Psy.D.       Clinical Neuropsychologist       Billing Code/Service: [phone]  Reason for Service:    Mary Mercado is an 82 year old right-handed female referred for neuropsychological consultation due to coping and adjustment issues during her ongoing admission home to the comprehensive inpatient rehabilitation unit.  Patient has a history of 3 prior cerebrovascular events including strokes that impacted her cerebellum, right parietal lobe and history of prior micro hemorrhages with microvascular ischemic disease.  Other past medical history includes hyperlipidemia, hypertension and residual left-sided weakness, visual deficits due to glaucoma, A-fib and 1 seizure event in 2022 maintained on Keppra.  The patient recently tested positive for COVID on 01/15/2023 and was quarantined at [facility] assisted living facility ([facility]  Cranial CTA identified emergent large vessel occlusion of the M1 segment.  Patient underwent emergent thrombectomy on 01/24/2023 by IR.  Patient unable to have MRI/MRA due to pacemaker.  The patient was awake and alert but clear expressive language deficits were noted with word finding difficulties, dysarthric speech noted.  Patient was aware of this and some of the symptoms were present before the most recent stroke.  Patient was aware that she had had previous strokes.  Patient notes that she has been challenged emotionally due to loss of function but she is doing okay as far as current admission without significant or severe depressive  responses.  Patient was oriented but had slowed information processing capacity and deficits for word retrieval that were beyond simple or primary motor deficits and speech.  Patient reports that she has been working on swallowing and has trouble taking her medications and takes a long time.  Patient was oriented x 4 and displayed adequate memory but notes she has difficulty retrieving some information but ultimately is able to learn and remember information.  HPI for the current admission:    HPI: Mary Mercado. 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  Per chart review patient lives with spouse.  [facility].  Used a rollator for mobility.  Presented 01/24/2023 with acute onset of left-sided weakness and slurred speech.  Cranial CT scan negative for acute findings.  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  Underwent emergent thrombectomy 01/24/2023 per interventional radiology.  She was not eligible for MRA due to pacemaker lead being in the atrium.  Echocardiogram with ejection fraction of 60 to 65% no wall motion abnormalities.  Follow-up CT showed beam hardening artifact at the level of the mid/anterior right temporal lobe no intracranial hemorrhage.  In regards to patient recent COVID infection diagnosed 9/25/ 2024 completed Paxlovid course.  Follow-up testing 10/1 positive patient asymptomatic currently on airborne contact precautions..  She is currently maintained on Pradaxa for CVA  prophylaxis.  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  therapy evaluations completed due to patient decreased functional mobility and left-sided weakness was  admitted for a comprehensive rehab program.   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/30/2023   Hypokalemia 01/30/2023   Hx of seizure disorder 01/30/2023   COVID 01/26/2023   Dysphagia 01/26/2023   Acute respiratory failure (HCC) 01/24/2023   Preoperative cardiovascular examination 02/22/2022   Pacemaker - MDT 07/06/2020   New onset seizure (HCC) 06/28/2020   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   Hemiplegia and hemiparesis following cerebral infarction affecting left non-dominant side (HCC) 02/05/2020   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) 01/14/2020   CVA (cerebral vascular accident) (HCC) 01/09/2020   Dizziness 11/30/2018   Paroxysmal atrial fibrillation (HCC) 08/13/2018   Prediabetes 06/15/2018   Palpitations 06/05/2018   Essential hypertension 06/05/2018   Hyperlipidemia 06/05/2018   Acute ischemic right MCA stroke (HCC) 06/05/2018   Stroke (cerebrum) (HCC) - R MCA s/p tPA, embolic, source unknown 05/31/2018   Atrial fibrillation (HCC) 05/28/2015   PVC's (premature ventricular contractions) 05/28/2015   BENIGN POSITIONAL VERTIGO 06/16/2009   Allergic rhinitis 06/16/2009    Behavioral Observation/Mental Status:   Flower Motsinger [name]  presents as a 82 y.o.-year-old Right handed Caucasian Female who appeared her stated age. her dress was Appropriate and she was Well Groomed and her manners were Appropriate to the situation.  her participation was indicative of Appropriate and Redirectable behaviors.  There were physical disabilities noted.  she displayed an appropriate level of cooperation and motivation.     Interactions:    Active Appropriate  Attention:   abnormal and attention span appeared shorter than expected for age  Memory:   abnormal; remote memory intact, recent memory impaired  Visuo-spatial:   abnormal  Speech (Volume):  low  Speech:   slurred; word finding difficulties with no paraphasic errors but retrieval deficits noted.  Thought Process:  Coherent and Relevant  Coherent, Directed, and Logical  Though Content:  WNL; not suicidal and not homicidal  Orientation:   person, place, time/date, and situation  Judgment:   Fair  Planning:   Fair  Affect:    Anxious  Mood:    Anxious and Dysphoric  Insight:   Good  Intelligence:   normal  Psychiatric History:  No prior psychiatric history and no prior psychotropic medications.  Family Med/Psych History:  Family History  Problem Relation Age of Onset   Diabetes Mother    Heart attack Mother        74   Colon cancer Father    Heart attack Brother 29   Sleep apnea Neg Hx      Impression/DX:   Anastasija Motsinger Schauer is an 82 year old right-handed female referred for neuropsychological consultation due to coping and adjustment issues during her ongoing admission home to the comprehensive inpatient rehabilitation unit.  Patient has a history of 3 prior cerebrovascular events including strokes that impacted her cerebellum, right parietal lobe and history of prior micro hemorrhages with microvascular ischemic disease.  Other  past medical history includes hyperlipidemia, hypertension and residual left-sided weakness, visual deficits due to glaucoma, A-fib and 1 seizure event in 2022 maintained on Keppra.  The patient recently tested positive for COVID on 01/15/2023 and was quarantined at her assisted living facility ([facility]).  Cranial CTA identified emergent large vessel occlusion of the M1 segment.  Patient underwent emergent thrombectomy on 01/24/2023 by IR.  Patient unable to have MRI/MRA due to pacemaker.  The  patient was awake and alert but clear expressive language deficits were noted with word finding difficulties, dysarthric speech noted.  Patient was aware of this and some of the symptoms were present before the most recent stroke.  Patient was aware that she had had previous strokes.  Patient notes that she has been challenged emotionally due to loss of function but she is doing okay as far as current admission without significant or severe depressive responses.  Patient was oriented but had slowed information processing capacity and deficits for word retrieval that were beyond simple or primary motor deficits and speech.  Patient reports that she has been working on swallowing and has trouble taking her medications and takes a long time.  Patient was oriented x 4 and displayed adequate memory but notes she has difficulty retrieving some information but ultimately is able to learn and remember information.  Disposition/Plan:  Today we worked on coping and adjustment issues and the patient is doing better as she is seeing some improvements.  Patient does display some anxiety and is having some difficulty differentiating the mild motor tremors and tremulous feeling she has in her abdomen which may be creating some increased anxiety due to physical response.  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         Electronically Signed   _______________________ Arley Phenix, Psy.D. Clinical Neuropsychologist

## 2023-02-03 NOTE — Progress Notes (Signed)
PROGRESS NOTE   Subjective/Complaints: No events overnight.  Vital stable, no c/o pain overnight.  Getting a shower this getting a shower this morning, which is relieving to the patient.  She continues to complain of some pain and soreness under her left breast, although continues to deny wanting any treatment for this.  Discussed swelling in her right lower extremity, patient and her husband are unsure if she has had uneven swelling in the past.  She did start having swelling a few days before admission to the hospital, husband denies any medication changes at that time.  She states her swelling was well-controlled with SCDs on the inpatient side.  ROS: Denies fevers, chills, N/V, abdominal pain, constipation, diarrhea, SOB, cough, chest pain, new weakness or paraesthesias.  Per HPI above.   Objective:   No results found. No results for input(s): "WBC", "HGB", "HCT", "PLT" in the last 72 hours.  No results for input(s): "NA", "K", "CL", "CO2", "GLUCOSE", "BUN", "CREATININE", "CALCIUM" in the last 72 hours.   Intake/Output Summary (Last 24 hours) at 02/03/2023 1043 Last data filed at 02/03/2023 0729 Gross per 24 hour  Intake 472 ml  Output --  Net 472 ml        Physical Exam: Vital Signs Blood pressure 124/82, pulse 70, temperature 97.7 F (36.5 C), resp. rate 16, height 5\' 5"  (1.651 m), weight 46.8 kg, SpO2 99%.   PE: Constitution: Appropriate appearance for age. No apparent distress. Underweight.  Sitting up in shower. Resp: No respiratory distress. No accessory muscle usage. on RA and CTAB Cardio: Well perfused appearance.  +2 right lower extremity edema, 1+ LLE.  Blue discoloration of bilateral feet but brisk capillary refill and palpable pulses.  +pacemaker Abdomen: Nondistended. Nontender.   Psych: Appropriate mood and affect. Neuro: AAOx4.  + Moderate cognitive deficits , delayed recall.  - unchanged; does  repeat herself occasionally CN 2-12 intact  Sensory exam: revealed normal sensation in all dermatomal regions in bilateral upper extremities and bilateral lower extremities Motor exam: strength 5/5 throughout bilateral upper extremities and with exception of bilateral lower extremities 4+/5 throughout  - antigravity and against resistance Coordination: Fine motor coordination was normal.  No drift on exam.   MSK: Tenderness to palpation of right calf.     Assessment/Plan: 1. Functional deficits which require 3+ hours per day of interdisciplinary therapy in a comprehensive inpatient rehab setting. Physiatrist is providing close team supervision and 24 hour management of active medical problems listed below. Physiatrist and rehab team continue to assess barriers to discharge/monitor patient progress toward functional and medical goals  Care Tool:  Bathing    Body parts bathed by patient: Right arm, Chest, Left arm, Front perineal area, Abdomen, Right upper leg, Buttocks, Left upper leg, Right lower leg, Left lower leg, Face         Bathing assist Assist Level: Supervision/Verbal cueing     Upper Body Dressing/Undressing Upper body dressing   What is the patient wearing?: Pull over shirt (x2 (under tank top))    Upper body assist Assist Level: Supervision/Verbal cueing    Lower Body Dressing/Undressing Lower body dressing      What is the patient wearing?:  Underwear/pull up, Pants     Lower body assist Assist for lower body dressing: Contact Guard/Touching assist     Toileting Toileting    Toileting assist Assist for toileting: Contact Guard/Touching assist     Transfers Chair/bed transfer  Transfers assist     Chair/bed transfer assist level: Contact Guard/Touching assist     Locomotion Ambulation   Ambulation assist      Assist level: Contact Guard/Touching assist Assistive device: Rollator Max distance: 200'   Walk 10 feet activity   Assist      Assist level: Contact Guard/Touching assist Assistive device: Rollator   Walk 50 feet activity   Assist    Assist level: Contact Guard/Touching assist Assistive device: Rollator    Walk 150 feet activity   Assist    Assist level: Contact Guard/Touching assist Assistive device: Rollator    Walk 10 feet on uneven surface  activity   Assist     Assist level: Contact Guard/Touching assist Assistive device: Rollator   Wheelchair     Assist Is the patient using a wheelchair?: No (pt ambulates to/from room to gym)             Wheelchair 50 feet with 2 turns activity    Assist            Wheelchair 150 feet activity     Assist          Blood pressure 124/82, pulse 70, temperature 97.7 F (36.5 C), resp. rate 16, height 5\' 5"  (1.651 m), weight 46.8 kg, SpO2 99%.  1. 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  Antithrombotics: -DVT/anticoagulation:  Pharmaceutical: Other (comment) Pradaxa             -antiplatelet therapy: N/A  10/11: Duplex right lower extremity ordered for edema, pain.  If negative, patient requests SCDs to assist with edema management.  Add TED hose.  3. Pain Management: Tylenol as needed   - 10/8: C/o L chest pain over ribs/under pacemaker, does not want topicals for tx  10/10: As needed lidocaine patch for left chest pain  4. Mood/Behavior/Sleep: Provide emotional support             -antipsychotic agents: N/A  -10-10: Has been noticing some personality changes and the patient Po stroke, specifically that she has irritability and a sense of urgency regarding any requested task.  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   - Did discuss possibility that this is more related to her memory.  Also possible side effect from Keppra, although she has been tolerating this since 06-2020.  5. Neuropsych/cognition: This patient is capable of making decisions on her own behalf.   - Mild cog deficits, but functional and approrpaite  -May benefit from neuropsych consult due to cognitive and irritability changes  6. Skin/Wound Care: Routine skin checks 7. Fluids/Electrolytes/Nutrition: Routine in and outs with follow-up chemistries 8.  COVID infection.  Recent COVID infection diagnosed 9/25.  Completed Paxlovid course.  Follow-up  testing +10/1 on retest patient asymptomatic.  Precautions completed 01/29/2023 9.  Atrial fibrillation/pacemaker.  Pradaxa.  Cardiac rate controlled   - Pace 60-70s; continue current medicaitons 10.  History of seizure.  Keppra 500 mg twice daily.  Followed by [name], NP at [facility] 11.  Hyperlipidemia.  Lipitor 12. Hypokalemia. Recheck labs - mild 3.4; replete 10 meq daily and recheck 10/11 - 5.0; stop repletion  13. Dysphagia. Dys 3/thin             -Continue SLP  Diet Orders (From admission, onward)     Start     Ordered   01/30/23 1432  DIET DYS 3 Room service appropriate? Yes with Assist; Fluid consistency: Thin  Diet effective now       Question Answer Comment  Room service appropriate? Yes with Assist   Fluid consistency: Thin      01/30/23 1431            14. HTN. Home meds norvasc and cozzar. Long term goal normotensive. Monitor with activity   - well controlled; monitor  10-9: Some mild hypertension intermittently, normotensive at other times.  No adjustments for now due to concern for overcorrection.  Treat if consistently greater than 140 10/10: Educated family on need to avoid overcorrection in the postacute period.  Does have consistent a.m. hypertension, will resume Norvasc at low-dose 2.5 mg daily and monitor 2 to 3 days. 10/11:  Worsening edema, getting duplex as above but will transition from Norvasc to Toprol 12.5 mg at bedtime (home dose) for blood pressure treatment due to possible side effect.    02/03/2023    5:40 AM 02/02/2023    7:28 PM 02/02/2023    1:55 PM  Vitals with BMI  Systolic 124 109 161  Diastolic 82 81 69  Pulse 70 69 70     LOS: 4 days A FACE TO FACE EVALUATION WAS PERFORMED  Angelina Sheriff 02/03/2023, 10:43 AM

## 2023-02-03 NOTE — Progress Notes (Signed)
Occupational Therapy Session Note  Patient Details  Name: Mary Mercado MRN: [id number] Date of Birth: 07/29/1940  Today's Date: 02/03/2023 OT Individual Time: 1103-1202 OT Individual Time Calculation (min): 59 min   Short Term Goals: Week 1:  OT Short Term Goal 1 (Week 1): LTG=STG 2/2 ELOS  Skilled Therapeutic Interventions/Progress Updates:    Pt greeted sitting in recliner with spouse present. Education provided regarding safe BADL performance in home environment, home modifications, DME needs, and safety awareness.  Pt has a very accessible independent living apartment with level entry shower. Wide doorways, and already has a shower seat and rollator she used PTA. OT educated spouse on providing supervision, for which he was doing after her previous stroke.Pt ambulated to the dresser and locked rollator brakes to sit down and collect clothing for after shower. Pt then ambulated to bathroom and sat on commode to void bladder. Pt completed peri-care then doffed clothing seated on toilet with supervision. Bathing tasks completed from tub bench in shower with increased time, but overall close supervision. Pt ambulated out of bathroom and completed dressing tasks seated in wc with supervision. Pt blow dried hair at the sink in seated position as she does at home. Pt left seated in wc with needs met.    Therapy Documentation Precautions:  Precautions Precautions: Fall Restrictions Weight Bearing Restrictions: No Pain: Pain Assessment Pain Scale: 0-10 Pain Score: 0-No pain    Therapy/Group: Individual Therapy  Mal Amabile 02/03/2023, 11:29 AM

## 2023-02-03 NOTE — Progress Notes (Signed)
Speech Language Pathology Daily Session Note  Patient Details  Name: Mary Mercado MRN: [id number] Date of Birth: 31-Jul-1940  Today's Date: 02/03/2023 SLP Individual Time: 1006-1030 and 1350-1445 SLP Individual Time Calculation (min): 24 min and 55 min  Short Term Goals: Week 1: SLP Short Term Goal 1 (Week 1): STGs=LTGs due to ELOS  Skilled Therapeutic Interventions:   Session 1: SLP conducted skilled therapy session targeting speech intelligibility goals. Patient upright in chair with husband in room and agreeable to session. SLP introduced SLOP speech intelligibility increasing strategies and patient utilized strategies at the phrase and sentence levels with 75% accuracy given modA overall. Notably, patient's voice became fatigued at the end of ~5 minutes of practice and patient was seemingly utilizing increased effort from the laryngeal/pharyngeal muscles to increase volume vs. Breath support. SLP and patient discussed the importance of diaphragmatic breathing and support coming from the lungs rather than the pharyngeal muscles with patient verbalizing understanding. Despite indications of understanding, during guided diaphragmatic breathing practice, patient demonstrated primarily clavicular breathing and difficulty moving the breath lower in the body. SLP will continue to target breath support to assist with speech intelligibility and reduce instances of vocal fatigue. SLP provided patient with handout outlining all discussed speech strategies. Patient was left in chair with call bell in reach and chair alarm set with tech in room to collect sample for the lab. SLP will continue to target goals per plan of care.    Session 2: SLP conducted skilled therapy session targeting communication and cognitive retraining goals. Patient received upright in chair and agreeable to participate in therapy session. SLP and patient discussed previously discussed speech techniques with patient  benefiting from modA to recall SLOP strategies. Patient able to recall details of diaphragmatic breathing practice with supervision-minA and completed breathing practice exercises x5 with mod verbal cues. Patient read through paragraph level text with mod cues for pausing to breathe during long sentences and minA to limit impulsivity. SLP and patient then discussed memory of daily events with patient recalling all previous events of the morning with supervisionA. At end of the session, patient requested assistance to commode where she was continent of bowel and bladder. Patient was left in lowered bed with call bell in reach and bed alarm set. SLP will continue to target goals per plan of care.       Pain (Session 1 and 2) Pain Assessment Pain Scale: 0-10 Pain Score: 0-No pain  Therapy/Group: Individual Therapy  [name], M.A., CCC-SLP  [name] 02/03/2023, 11:19 AM

## 2023-02-03 NOTE — Progress Notes (Signed)
Physical Therapy Session Note  Patient Details  Name: Mary Mercado MRN: [id number] Date of Birth: Jan 11, 1941  Today's Date: 02/03/2023 PT Individual Time: 0910-1005 PT Individual Time Calculation (min): 55 min   Short Term Goals: Week 1:  PT Short Term Goal 1 (Week 1): STG = LTG due to ELOS  Skilled Therapeutic Interventions/Progress Updates:    Pt presents in room in recliner with pt husband present for family education. Pt denies pain. Session focused on gait training and transfer training with education on pt CLOF and DC planning. Pt does require increased time to complete all tasks and cues to attend to task throughout session. Pt takes rest breaks during session between activities by sitting in rollator with cues for placement however does not requiring cueing for locking WC prior to turn and sit.  Pt ambulates with rollator within room navigating narrow spaces with distant supervision, then ambulates to ortho gym >250' with rollator with supervision in busy hallway. Pt completes car transfer with supervision with cues for sitting on seat prior to bringing BLEs into car. Pt ambulates up/down ramp with rollator with supervision managing speed and brakes on rollator well.   Pt ambulates to main gym with supervision, compeltes up/down 4" curb with CGA to min assist for managing rollator off curb for positioning. Pt then requests to complete 8" curb step with rollator, requires min assist for managing rollator off curb due to fear of falling with leaning forward.  Pt then ambulates to dayroom with rollator with supervision, then ambulates without device 60' CGA with pt demonstrating good arm swing but increased lateral postural sway and decreased hip/ankle strategies for reactive balance. Pt completes walk holding therapist's arm x50' to simulate how she and her husband sometimes ambulate without rollator prior to admission, completes 2nd trial with pt husband both demonstrating good  stability with task and no cues required for guarding technique.  Pt returns to room ambulating with rollator with supervision and remains seated in recliner with all needs within reach, handoff to SLP at end of session.  Therapy Documentation Precautions:  Precautions Precautions: Fall Restrictions Weight Bearing Restrictions: No   Therapy/Group: Individual Therapy  Edwin Cap PT, DPT 02/03/2023, 1:02 PM

## 2023-02-03 NOTE — Progress Notes (Signed)
Patient ID: Mary Mercado, female   DOB: July 12, 1940, 82 y.o.   MRN: [id number]  SW left message for pt husband informing to look for contact information for [facility] therapies in discharge instructions.   Cecile Sheerer, MSW, LCSWA Office: [phone] Cell: [phone] Fax: [phone]

## 2023-02-03 NOTE — Progress Notes (Signed)
RLE venous duplex has been completed.   Results can be found under chart review under CV PROC. 02/03/2023 7:25 PM Elliett Guarisco RVT, RDMS

## 2023-02-03 NOTE — Patient Care Conference (Cosign Needed)
Inpatient RehabilitationTeam Conference and Plan of Care Update Date: 01/31/2023   Time: 1049am    Patient Name: Mary Mercado      Medical Record Number: [id number]  Date of Birth: 1940/12/03 Sex: Female         Room/Bed: 4W25C/4W25C-01 Payor Info: Payor: MEDICARE / Plan: MEDICARE PART A AND B / Product Type: *No Product type* /    Admit Date/Time:  01/30/2023  2:10 PM  Primary Diagnosis:  Right middle cerebral artery stroke [facility])  Hospital Problems: Principal Problem:   Right middle cerebral artery stroke [facility]) Active Problems:   Hyperlipidemia   Hypokalemia   Hx of seizure disorder   Adjustment disorder    Expected Discharge Date: Expected Discharge Date: 02/07/23  Team Members Present: Physician leading conference: Dr. Elijah Birk Social Worker Present: Cecile Sheerer, LCSWA Nurse Present: Other (comment) Konrad Dolores RN) PT Present: Darrold Span, PT OT Present: Kearney Hard, OT SLP Present: Feliberto Gottron, SLP PPS Coordinator present : Fae Pippin, SLP     Current Status/Progress Goal Weekly Team Focus  Bowel/Bladder   Continent x2. Last bowel movement 10/7.   Remain continent.   Monitor for changes in bowel and bladder function.    Swallow/Nutrition/ Hydration   Eval Pending           ADL's   CGA/Supervision   mod I   self-care retraining, activity tolerance, L NMR    Mobility   eval pending           Communication   Eval Pending            Safety/Cognition/ Behavioral Observations  Eval Pending            Pain   Denies pain this shift.   Pain less than or equal to 2.   Assess pain q shift and prn.    Skin   Clean and intact. Old scab to right groin area.   No skin breakdown.  Assess skin q shift and prn.      Discharge Planning:    Evals pending  Team Discussion: Patient  is admitted post right MCA CVA with hypokalemia; post covid Patient on target to meet rehab goals: yes, currently needs contact  guard to min assist for ADLs. All other evals pending.  *See Care Plan and progress notes for long and short-term goals.   Revisions to Treatment Plan:  N/a Teaching Needs: Safety, Medications, transfers, toileting , dietary modifications, etc.  Current Barriers to Discharge: none  Possible Resolutions to Barriers:  Family Education     Medical Summary Current Status: medically complicated by hypertension, atrial fibrillation, hypokalemia, pain in L ribs/chest, malnutrition, and cognitiion  Barriers to Discharge: Cardiac Complications;Inadequate Nutritional Intake;Medical stability;Self-care education;Uncontrolled Pain;Electrolyte abnormality   Possible Resolutions to [employer]: titrate pain medications to minimum tolerated doses for function,replete potassium, monitor vitals and increase BP regimen as approrpiate   Continued Need for Acute Rehabilitation Level of Care: The patient requires daily medical management by a physician with specialized training in physical medicine and rehabilitation for the following reasons: Direction of a multidisciplinary physical rehabilitation program to maximize functional independence : Yes Medical management of patient stability for increased activity during participation in an intensive rehabilitation regime.: Yes Analysis of laboratory values and/or radiology reports with any subsequent need for medication adjustment and/or medical intervention. : Yes   I attest that I was present, lead the team conference, and concur with the assessment and plan of the team.   Konrad Dolores  Gayo 02/03/2023, 1:07 PM

## 2023-02-04 NOTE — Progress Notes (Signed)
PROGRESS NOTE   Subjective/Complaints: No events overnight. No acute complaints.  Vital stable, labs 10/11 unchanged.  LBM 10/11.   ROS: Denies fevers, chills, N/V, abdominal pain, constipation, diarrhea, SOB, cough, chest pain, new weakness or paraesthesias.  Per HPI above.   Objective:   VAS [place] LOWER EXTREMITY VENOUS (DVT)  Result Date: 02/03/2023  Lower Venous DVT Study Patient Name:  Mary Mercado  Date of Exam:   02/03/2023 Medical Rec #: [id number]                  Accession #:    [id number] Date of Birth: 05-20-1940                  Patient Gender: F Patient Age:   82 years Exam Location:  [facility] Procedure:      VAS [place] LOWER EXTREMITY VENOUS (DVT) Referring Phys: Elijah Birk --------------------------------------------------------------------------------  Indications: Swelling - rehab patient.  Comparison Study: Previous exam on 05/28/2018 was negative for DVT Performing Technologist: Ernestene Mention RVT, RDMS  Examination Guidelines: A complete evaluation includes B-mode imaging, spectral Doppler, color Doppler, and power Doppler as needed of all accessible portions of each vessel. Bilateral testing is considered an integral part of a complete examination. Limited examinations for reoccurring indications may be performed as noted. The reflux portion of the exam is performed with the patient in reverse Trendelenburg.  +---------+---------------+---------+-----------+----------+--------------+ RIGHT    CompressibilityPhasicitySpontaneityPropertiesThrombus Aging +---------+---------------+---------+-----------+----------+--------------+ CFV      Full           Yes      Yes                                 +---------+---------------+---------+-----------+----------+--------------+ SFJ      Full                                                         +---------+---------------+---------+-----------+----------+--------------+ FV Prox  Full           Yes      Yes                                 +---------+---------------+---------+-----------+----------+--------------+ FV Mid   Full           Yes      Yes                                 +---------+---------------+---------+-----------+----------+--------------+ FV DistalFull           Yes      Yes                                 +---------+---------------+---------+-----------+----------+--------------+ PFV      Full                                                        +---------+---------------+---------+-----------+----------+--------------+  POP      Full           Yes      Yes                                 +---------+---------------+---------+-----------+----------+--------------+ PTV      Full                                                        +---------+---------------+---------+-----------+----------+--------------+ PERO     Full                                                        +---------+---------------+---------+-----------+----------+--------------+   +----+---------------+---------+-----------+----------+--------------+ LEFTCompressibilityPhasicitySpontaneityPropertiesThrombus Aging +----+---------------+---------+-----------+----------+--------------+ CFV Full           Yes      Yes                                 +----+---------------+---------+-----------+----------+--------------+     Summary: RIGHT: - There is no evidence of deep vein thrombosis in the lower extremity.  - No cystic structure found in the popliteal fossa. Subcutaneous edema in area of calf and ankle  LEFT: - No evidence of common femoral vein obstruction.   *See table(s) above for measurements and observations.    Preliminary    No results for input(s): "WBC", "HGB", "HCT", "PLT" in the last 72 hours.  Recent Labs    02/03/23 1032  NA 137  K 5.0  CL  104  CO2 26  GLUCOSE 101*  BUN 16  CREATININE 0.59  CALCIUM 9.1     Intake/Output Summary (Last 24 hours) at 02/04/2023 1126 Last data filed at 02/04/2023 0814 Gross per 24 hour  Intake 836 ml  Output --  Net 836 ml        Physical Exam: Vital Signs Blood pressure 137/89, pulse 70, temperature (!) 97.5 F (36.4 C), temperature source Oral, resp. rate 18, height 5\' 5"  (1.651 m), weight 46.8 kg, SpO2 99%.   PE: Constitution: Appropriate appearance for age. No apparent distress. Underweight.  Sitting up in shower. Resp: No respiratory distress. No accessory muscle usage. on RA and CTAB Cardio: Well perfused appearance.  +2 right lower extremity edema, 1+ LLE.  Blue discoloration of bilateral feet but brisk capillary refill and palpable pulses.  +pacemaker Abdomen: Nondistended. Nontender.   Psych: Appropriate mood and affect. Neuro: AAOx4.  + Moderate cognitive deficits , delayed recall.  - unchanged; does repeat herself occasionally CN 2-12 intact  Sensory exam: revealed normal sensation in all dermatomal regions in bilateral upper extremities and bilateral lower extremities Motor exam: strength 5/5 throughout bilateral upper extremities and with exception of bilateral lower extremities 4+/5 throughout  - antigravity and against resistance Coordination: Fine motor coordination was normal.  No drift on exam.   MSK: Tenderness to palpation of right calf.     Assessment/Plan: 1. Functional deficits which require 3+ hours per day of interdisciplinary therapy in a comprehensive inpatient rehab setting. Physiatrist is providing close team supervision and 24 hour management  of active medical problems listed below. Physiatrist and rehab team continue to assess barriers to discharge/monitor patient progress toward functional and medical goals  Care Tool:  Bathing    Body parts bathed by patient: Right arm, Chest, Left arm, Front perineal area, Abdomen, Right upper leg,  Buttocks, Left upper leg, Right lower leg, Left lower leg, Face         Bathing assist Assist Level: Supervision/Verbal cueing     Upper Body Dressing/Undressing Upper body dressing   What is the patient wearing?: Pull over shirt (x2 (under tank top))    Upper body assist Assist Level: Supervision/Verbal cueing    Lower Body Dressing/Undressing Lower body dressing      What is the patient wearing?: Underwear/pull up, Pants     Lower body assist Assist for lower body dressing: Contact Guard/Touching assist     Toileting Toileting    Toileting assist Assist for toileting: Contact Guard/Touching assist     Transfers Chair/bed transfer  Transfers assist     Chair/bed transfer assist level: Contact Guard/Touching assist     Locomotion Ambulation   Ambulation assist      Assist level: Contact Guard/Touching assist Assistive device: Rollator Max distance: 200'   Walk 10 feet activity   Assist     Assist level: Contact Guard/Touching assist Assistive device: Rollator   Walk 50 feet activity   Assist    Assist level: Contact Guard/Touching assist Assistive device: Rollator    Walk 150 feet activity   Assist    Assist level: Contact Guard/Touching assist Assistive device: Rollator    Walk 10 feet on uneven surface  activity   Assist     Assist level: Contact Guard/Touching assist Assistive device: Rollator   Wheelchair     Assist Is the patient using a wheelchair?: No (pt ambulates to/from room to gym)             Wheelchair 50 feet with 2 turns activity    Assist            Wheelchair 150 feet activity     Assist          Blood pressure 137/89, pulse 70, temperature (!) 97.5 F (36.4 C), temperature source Oral, resp. rate 18, height 5\' 5"  (1.651 m), weight 46.8 kg, SpO2 99%.  1. 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  Antithrombotics: -DVT/anticoagulation:  Pharmaceutical: Other (comment) Pradaxa             -antiplatelet therapy: N/A  10/11: Duplex right lower extremity ordered for edema, pain.  If negative, patient requests SCDs to assist with edema management.  Add TED hose.--> Duplex negative, add SCDs  3. Pain Management: Tylenol as needed   - 10/8: C/o L chest pain over ribs/under pacemaker, does not want topicals for tx  10/10: As needed lidocaine patch for left chest pain  4. Mood/Behavior/Sleep: Provide emotional support             -antipsychotic agents: N/A  -10-10: Has been noticing some personality changes and the patient Po  stroke, specifically that she has irritability and a sense of urgency regarding any requested task.  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   - Did discuss possibility that this is more related to her memory.  Also possible side effect from Keppra, although she has been tolerating this since 06-2020.  5. Neuropsych/cognition: This patient is capable of making decisions on her own behalf.   - Mild cog deficits, but functional and approrpaite  -May benefit from neuropsych consult due to cognitive and irritability changes  6. Skin/Wound Care: Routine skin checks 7. Fluids/Electrolytes/Nutrition: Routine in and outs with follow-up chemistries 8.  COVID infection.  Recent COVID infection diagnosed 9/25.  Completed Paxlovid course.  Follow-up testing +10/1 on retest patient asymptomatic.  Precautions completed 01/29/2023 9.  Atrial fibrillation/pacemaker.  Pradaxa.  Cardiac rate controlled   - Pace 60-70s; continue current medicaitons 10.  History of seizure.  Keppra 500 mg twice daily.  Followed by [name], NP  at [facility] 11.  Hyperlipidemia.  Lipitor 12. Hypokalemia. Recheck labs - mild 3.4; replete 10 meq daily and recheck 10/11 - 5.0; stop repletion  13. Dysphagia. Dys 3/thin             -Continue SLP  Diet Orders (From admission, onward)     Start     Ordered   01/30/23 1432  DIET DYS 3 Room service appropriate? Yes with Assist; Fluid consistency: Thin  Diet effective now       Question Answer Comment  Room service appropriate? Yes with Assist   Fluid consistency: Thin      01/30/23 1431            14. HTN. Home meds norvasc and cozzar. Long term goal normotensive. Monitor with activity   - well controlled; monitor  10-9: Some mild hypertension intermittently, normotensive at other times.  No adjustments for now due to concern for overcorrection.  Treat if consistently greater than 140 10/10: Educated family on need to avoid overcorrection in the postacute period.  Does have consistent a.m. hypertension, will resume Norvasc at low-dose 2.5 mg daily and monitor 2 to 3 days. 10/11: Worsening edema, getting duplex as above but will transition from Norvasc to Toprol 12.5 mg at bedtime (home dose) for blood pressure treatment due to possible side effect. 10/12: BP well controlled; monitor     02/04/2023    4:30 AM 02/03/2023    7:43 PM 02/03/2023    1:02 PM  Vitals with BMI  Systolic 137 101 829  Diastolic 89 67 66  Pulse 70 70 72     LOS: 5 days A FACE TO FACE EVALUATION WAS PERFORMED  Angelina Sheriff 02/04/2023, 11:26 AM

## 2023-02-05 NOTE — Progress Notes (Signed)
Speech Language Pathology Daily Session Note  Patient Details  Name: Mary Mercado MRN: [id number] Date of Birth: [birth year]  Today's Date: 02/05/2023 SLP Individual Time: 0905-1000 SLP Individual Time Calculation (min): 55 min  Short Term Goals: Week 1: SLP Short Term Goal 1 (Week 1): STGs=LTGs due to ELOS  Skilled Therapeutic Interventions:  Pt was seen for skilled ST targeting goals for dysphagia and communication.  Pt was upright in recliner upon therapist's arrival, voicing concerns that she hadn't yet received her morning medications.  Pt was encouraged to call nursing station to request morning meds which she did with mod I use of speech intelligibility strategies.  Nursing arrived shortly thereafter to administer meds.  SLP then facilitated the session with trials of regular textures to continue working towards diet progression.  Pt consumed advanced consistencies with mod I use of swallowing precautions and no overt s/s of aspiration.  Will defer ultimate decision regarding diet advancement to pt's primary SLP.  During informal conversations with SLP, pt utilized slowed rate of speech and overarticulation to achieve intelligibility at the conversational level.  Pt utilized her handout to recall speech intelligibility strategies independently.  Pt requested to use the bathroom at the end of today's therapy session and demonstrated no overtly unsafe behaviors when ambulating to and from bathroom with rollator.  Pt left in recliner with chair alarm set and call bell within reach.  Continue per current plan of care.    Pain Pain Assessment Pain Scale: 0-10 Pain Score: 0-No pain Faces Pain Scale: No hurt  Therapy/Group: Individual Therapy  Tmya Wigington, Melanee Spry 02/05/2023, 3:37 PM

## 2023-02-05 NOTE — Progress Notes (Addendum)
Occupational Therapy Session Note  Patient Details  Name: Mary Mercado MRN: [id number] Date of Birth: Feb 04, 1941  Today's Date: 02/05/2023 OT Individual Time: 1102-1200 OT Individual Time Calculation (min): 58 min    Short Term Goals: Week 1:  OT Short Term Goal 1 (Week 1): LTG=STG 2/2 ELOS  Skilled Therapeutic Interventions/Progress Updates:    Patient received seated in recliner - aware of seat belt alarm.  Patient eager for shower and ambulated to bathroom with supervision using rollator, required cueing to safely navigate to shower seat.  Patient familiar with prior procedure and able to guide at times.  Patient very aware of safety, often overtly talking herself through a transition.  Patient does best with rote steps of process.  Patient able to shower with supervision.  Able to dress herself with set up assistance.  Patient sat on rollator at mirror to blow dry hair.  Patient ambulated to recliner to ready herself for lunch.   Patient loves foam grip on hairbrush and requesting additional foam for her new brush.  Issued blue cylindrical foam grip.  Patient left up in recliner.  With safety belt in place and engaged, husband [name] at bedside.    Therapy Documentation Precautions:  Precautions Precautions: Fall Restrictions Weight Bearing Restrictions: No   Pain:  No report of pain        Therapy/Group: Individual Therapy  Collier Salina 02/05/2023, 12:22 PM

## 2023-02-05 NOTE — Progress Notes (Signed)
Occupational Therapy Session Note  Patient Details  Name: Mary Mercado MRN: [id number] Date of Birth: 28-Oct-1940  {CHL IP REHAB OT TIME CALCULATIONS:[id number]}   Short Term Goals: Week 1:  OT Short Term Goal 1 (Week 1): LTG=STG 2/2 ELOS  Skilled Therapeutic Interventions/Progress Updates:    Patient agreeable to participate in OT session. Reports *** pain level.   Patient participated in skilled OT session focusing on ***. Therapist facilitated/assessed/developed/educated/integrated/elicited *** in order to improve/facilitate/promote    Therapy Documentation Precautions:  Precautions Precautions: Fall Restrictions Weight Bearing Restrictions: No   Therapy/Group: Individual Therapy  Limmie Patricia, OTR/L,CBIS  Supplemental OT - [facility] and [facility] Secure Chat Preferred   02/05/2023, 9:08 PM

## 2023-02-05 NOTE — Progress Notes (Signed)
PROGRESS NOTE   Subjective/Complaints: No events overnight. No acute complaints.  Vital stable. LBM 10/12   ROS: Denies fevers, chills, N/V, abdominal pain, constipation, diarrhea, SOB, cough, chest pain, new weakness or paraesthesias.  Per HPI above.   Objective:   VAS [place] LOWER EXTREMITY VENOUS (DVT)  Result Date: 02/04/2023  Lower Venous DVT Study Patient Name:  Mary Mercado  Date of Exam:   02/03/2023 Medical Rec #: [id number]                  Accession #:    [id number] Date of Birth: November 11, 1940                  Patient Gender: F Patient Age:   82 years Exam Location:  [facility] Procedure:      VAS [place] LOWER EXTREMITY VENOUS (DVT) Referring Phys: Elijah Birk --------------------------------------------------------------------------------  Indications: Swelling - rehab patient.  Comparison Study: Previous exam on 05/28/2018 was negative for DVT Performing Technologist: Ernestene Mention RVT, RDMS  Examination Guidelines: A complete evaluation includes B-mode imaging, spectral Doppler, color Doppler, and power Doppler as needed of all accessible portions of each vessel. Bilateral testing is considered an integral part of a complete examination. Limited examinations for reoccurring indications may be performed as noted. The reflux portion of the exam is performed with the patient in reverse Trendelenburg.  +---------+---------------+---------+-----------+----------+--------------+ RIGHT    CompressibilityPhasicitySpontaneityPropertiesThrombus Aging +---------+---------------+---------+-----------+----------+--------------+ CFV      Full           Yes      Yes                                 +---------+---------------+---------+-----------+----------+--------------+ SFJ      Full                                                        +---------+---------------+---------+-----------+----------+--------------+ FV  Prox  Full           Yes      Yes                                 +---------+---------------+---------+-----------+----------+--------------+ FV Mid   Full           Yes      Yes                                 +---------+---------------+---------+-----------+----------+--------------+ FV DistalFull           Yes      Yes                                 +---------+---------------+---------+-----------+----------+--------------+ PFV      Full                                                        +---------+---------------+---------+-----------+----------+--------------+  POP      Full           Yes      Yes                                 +---------+---------------+---------+-----------+----------+--------------+ PTV      Full                                                        +---------+---------------+---------+-----------+----------+--------------+ PERO     Full                                                        +---------+---------------+---------+-----------+----------+--------------+   +----+---------------+---------+-----------+----------+--------------+ LEFTCompressibilityPhasicitySpontaneityPropertiesThrombus Aging +----+---------------+---------+-----------+----------+--------------+ CFV Full           Yes      Yes                                 +----+---------------+---------+-----------+----------+--------------+     Summary: RIGHT: - There is no evidence of deep vein thrombosis in the lower extremity.  - No cystic structure found in the popliteal fossa. Subcutaneous edema in area of calf and ankle  LEFT: - No evidence of common femoral vein obstruction.   *See table(s) above for measurements and observations. Electronically signed by Carolynn Sayers on 02/04/2023 at 1:29:18 PM.    Final    No results for input(s): "WBC", "HGB", "HCT", "PLT" in the last 72 hours.  Recent Labs    02/03/23 1032  NA 137  K 5.0  CL 104  CO2 26   GLUCOSE 101*  BUN 16  CREATININE 0.59  CALCIUM 9.1     Intake/Output Summary (Last 24 hours) at 02/05/2023 1124 Last data filed at 02/05/2023 0816 Gross per 24 hour  Intake 712 ml  Output --  Net 712 ml        Physical Exam: Vital Signs Blood pressure 127/86, pulse 72, temperature 98.1 F (36.7 C), resp. rate 18, height 5\' 5"  (1.651 m), weight 46.8 kg, SpO2 100%.   PE: Constitution: Appropriate appearance for age. No apparent distress. Underweight.  Sitting up in chair.  Resp: No respiratory distress. No accessory muscle usage. on RA and CTAB Cardio: Well perfused appearance.  +1 right lower extremity edema, trace+ LLE.  Blue discoloration of bilateral feet much imrpoved.   +pacemaker Abdomen: Nondistended. Nontender.   Psych: Appropriate mood and affect. Neuro: AAOx4.  + Moderate cognitive deficits , delayed recall.  - unchanged; does repeat herself occasionally CN 2-12 intact  Sensory exam: revealed normal sensation in all dermatomal regions in bilateral upper extremities and bilateral lower extremities Motor exam: strength 5/5 throughout bilateral upper extremities and with exception of bilateral lower extremities 4+/5 throughout  - antigravity and against resistance Coordination: Fine motor coordination was normal.  No drift on exam.   MSK: No further Tenderness to palpation of right calf.     Assessment/Plan: 1. Functional deficits which require 3+ hours per day of interdisciplinary therapy in a comprehensive inpatient rehab setting. Physiatrist is providing close team  supervision and 24 hour management of active medical problems listed below. Physiatrist and rehab team continue to assess barriers to discharge/monitor patient progress toward functional and medical goals  Care Tool:  Bathing    Body parts bathed by patient: Right arm, Chest, Left arm, Front perineal area, Abdomen, Right upper leg, Buttocks, Left upper leg, Right lower leg, Left lower leg, Face          Bathing assist Assist Level: Supervision/Verbal cueing     Upper Body Dressing/Undressing Upper body dressing   What is the patient wearing?: Pull over shirt (x2 (under tank top))    Upper body assist Assist Level: Supervision/Verbal cueing    Lower Body Dressing/Undressing Lower body dressing      What is the patient wearing?: Underwear/pull up, Pants     Lower body assist Assist for lower body dressing: Contact Guard/Touching assist     Toileting Toileting    Toileting assist Assist for toileting: Supervision/Verbal cueing     Transfers Chair/bed transfer  Transfers assist     Chair/bed transfer assist level: Supervision/Verbal cueing     Locomotion Ambulation   Ambulation assist      Assist level: Supervision/Verbal cueing Assistive device: Rollator Max distance: 200'   Walk 10 feet activity   Assist     Assist level: Supervision/Verbal cueing Assistive device: Rollator   Walk 50 feet activity   Assist    Assist level: Supervision/Verbal cueing Assistive device: Rollator    Walk 150 feet activity   Assist    Assist level: Supervision/Verbal cueing Assistive device: Rollator    Walk 10 feet on uneven surface  activity   Assist     Assist level: Supervision/Verbal cueing Assistive device: Rollator   Wheelchair     Assist Is the patient using a wheelchair?: No (pt ambulates to/from room to gym)             Wheelchair 50 feet with 2 turns activity    Assist            Wheelchair 150 feet activity     Assist          Blood pressure 127/86, pulse 72, temperature 98.1 F (36.7 C), resp. rate 18, height 5\' 5"  (1.651 m), weight 46.8 kg, SpO2 100%.  1. 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  Antithrombotics: -DVT/anticoagulation:  Pharmaceutical: Other (comment) Pradaxa             -antiplatelet therapy: N/A  10/11: Duplex right lower extremity ordered for edema, pain.  If negative, patient requests SCDs to assist with edema management.  Add TED hose.--> Duplex negative, add SCDs  3. Pain Management: Tylenol as needed   - 10/8: C/o L chest pain over ribs/under pacemaker, does not want topicals for tx  10/10: As needed lidocaine patch for left chest pain  4. Mood/Behavior/Sleep: Provide emotional support             -antipsychotic agents: N/A  -10-10: Has been noticing some personality changes and the patient Po stroke, specifically that she has irritability  and a sense of urgency regarding any requested task.  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   - Did discuss possibility that this is more related to her memory.  Also possible side effect from Keppra, although she has been tolerating this since 06-2020.  5. Neuropsych/cognition: This patient is capable of making decisions on her own behalf.   - Mild cog deficits, but functional and approrpaite  -May benefit from neuropsych consult due to cognitive and irritability changes  6. Skin/Wound Care: Routine skin checks 7. Fluids/Electrolytes/Nutrition: Routine in and outs with follow-up chemistries 8.  COVID infection.  Recent COVID infection diagnosed 9/25.  Completed Paxlovid course.  Follow-up testing +10/1 on retest patient asymptomatic.  Precautions completed 01/29/2023 9.  Atrial fibrillation/pacemaker.  Pradaxa.  Cardiac rate controlled   - Pace 60-70s; continue current medicaitons 10.  History of seizure.  Keppra 500 mg twice daily.  Followed by [name], NP at [facility] 11.  Hyperlipidemia.  Lipitor 12. Hypokalemia. Recheck labs - mild 3.4; replete 10 meq daily and recheck 10/11  - 5.0; stop repletion  13. Dysphagia. Dys 3/thin             -Continue SLP  Diet Orders (From admission, onward)     Start     Ordered   01/30/23 1432  DIET DYS 3 Room service appropriate? Yes with Assist; Fluid consistency: Thin  Diet effective now       Question Answer Comment  Room service appropriate? Yes with Assist   Fluid consistency: Thin      01/30/23 1431            14. HTN. Home meds norvasc and cozzar. Long term goal normotensive. Monitor with activity   - well controlled; monitor  10-9: Some mild hypertension intermittently, normotensive at other times.  No adjustments for now due to concern for overcorrection.  Treat if consistently greater than 140 10/10: Educated family on need to avoid overcorrection in the postacute period.  Does have consistent a.m. hypertension, will resume Norvasc at low-dose 2.5 mg daily and monitor 2 to 3 days. 10/11: Worsening edema, getting duplex as above but will transition from Norvasc to Toprol 12.5 mg at bedtime (home dose) for blood pressure treatment due to possible side effect. 10/12: BP well controlled; monitor     02/05/2023    3:41 AM 02/04/2023    8:06 PM 02/04/2023    2:04 PM  Vitals with BMI  Systolic 127 105 024  Diastolic 86 62 79  Pulse 72 70 70     LOS: 6 days A FACE TO FACE EVALUATION WAS PERFORMED  Angelina Sheriff 02/05/2023, 11:24 AM

## 2023-02-05 NOTE — Progress Notes (Signed)
Physical Therapy Session Note  Patient Details  Name: Mary Mercado MRN: [id number] Date of Birth: [birth year]  Today's Date: 02/05/2023 PT Individual Time: 0700-0814 PT Individual Time Calculation (min): 74 min   Short Term Goals: Week 1:  PT Short Term Goal 1 (Week 1): STG = LTG due to ELOS  Skilled Therapeutic Interventions/Progress Updates:    Pt presents up in recliner working on breakfast, adhering to her precautions. Discussed d/c planning and overall therapy goals. Denies concerns in regards to upcoming d/c - eager to get back to her routine. Functional gait and transfers including toileting in room at distance supervision level using rollator demonstrating good safety awareness and use of breaks appropriately. Functional gait training on unit with rollator > 150' with supervision with focus on endurance and general strengthening. Dynamic gait training with rollator in gym visual scanning for hidden items at various heights and then picking up and collecting all objects with supervision including reaching down to the floor. NMR for balance and fine motor retraining on compliant surface without UE support to manipulate clothespins with varying resistance with close supervision. End of session returned to room and recliner with supervsiion and all needs in reach.   Therapy Documentation Precautions:  Precautions Precautions: Fall Restrictions Weight Bearing Restrictions: No  Pain:  Denies pain.     Therapy/Group: Individual Therapy  Karolee Stamps Darrol Poke, PT, DPT, CBIS  02/05/2023, 8:33 AM

## 2023-02-06 DIAGNOSIS — I10 Essential (primary) hypertension: Secondary | ICD-10-CM | POA: Diagnosis not present

## 2023-02-06 DIAGNOSIS — R1310 Dysphagia, unspecified: Secondary | ICD-10-CM | POA: Diagnosis not present

## 2023-02-06 DIAGNOSIS — I63511 Cerebral infarction due to unspecified occlusion or stenosis of right middle cerebral artery: Secondary | ICD-10-CM | POA: Diagnosis not present

## 2023-02-06 LAB — CBC
HCT: 40.3 % (ref 36.0–46.0)
Hemoglobin: 13.3 g/dL (ref 12.0–15.0)
MCH: 30.7 pg (ref 26.0–34.0)
MCHC: 33 g/dL (ref 30.0–36.0)
MCV: 93.1 fL (ref 80.0–100.0)
Platelets: 228 10*3/uL (ref 150–400)
RBC: 4.33 MIL/uL (ref 3.87–5.11)
RDW: 13 % (ref 11.5–15.5)
WBC: 5 10*3/uL (ref 4.0–10.5)
nRBC: 0 % (ref 0.0–0.2)

## 2023-02-06 LAB — BASIC METABOLIC PANEL
Anion gap: 8 (ref 5–15)
BUN: 18 mg/dL (ref 8–23)
CO2: 28 mmol/L (ref 22–32)
Calcium: 9.1 mg/dL (ref 8.9–10.3)
Chloride: 104 mmol/L (ref 98–111)
Creatinine, Ser: 0.69 mg/dL (ref 0.44–1.00)
GFR, Estimated: >60 mL/min (ref >60)
Glucose, Bld: 98 mg/dL (ref 70–99)
Potassium: 3.9 mmol/L (ref 3.5–5.1)
Sodium: 140 mmol/L (ref 135–145)

## 2023-02-06 NOTE — Progress Notes (Signed)
PROGRESS NOTE   Subjective/Complaints: No acute events overnight.  Her primary complaint is that her morning meds are late this morning.   ROS: Denies fevers, chills, N/V, abdominal pain, constipation, diarrhea, SOB, cough, chest pain, headache, new weakness or paraesthesias.  Per HPI above.   Objective:   No results found. No results for input(s): "WBC", "HGB", "HCT", "PLT" in the last 72 hours.  No results for input(s): "NA", "K", "CL", "CO2", "GLUCOSE", "BUN", "CREATININE", "CALCIUM" in the last 72 hours.    Intake/Output Summary (Last 24 hours) at 02/06/2023 1455 Last data filed at 02/06/2023 0758 Gross per 24 hour  Intake 597 ml  Output --  Net 597 ml        Physical Exam: Vital Signs Blood pressure (!) 133/94, pulse 70, temperature (!) 97.5 F (36.4 C), temperature source Oral, resp. rate 18, height 5\' 5"  (1.651 m), weight 46.8 kg, SpO2 100%.   PE: Constitution: Appropriate appearance for age. No apparent distress. Underweight.  Sitting up in chair, eating breakfast. Resp: No respiratory distress. No accessory muscle usage. on RA and CTAB Cardio: Well perfused appearance.  +1 right lower extremity edema, trace+ LLE-unchanged   +pacemaker Abdomen: Nondistended. Nontender.   Psych: Appropriate mood and affect. Neuro: AAOx4.  + Moderate cognitive deficits , delayed recall.  - unchanged; does repeat herself occasionally CN 2-12 grossly intact  Sensory exam: revealed normal sensation in all dermatomal regions in bilateral upper extremities and bilateral lower extremities Motor exam: All 4 extremities antigravity and against resistance; mild weakness in bilateral hip flexors Coordination: Fine motor coordination was normal.  No drift on exam.   MSK: No joint swelling noted     Assessment/Plan: 1. Functional deficits which require 3+ hours per day of interdisciplinary therapy in a comprehensive inpatient rehab  setting. Physiatrist is providing close team supervision and 24 hour management of active medical problems listed below. Physiatrist and rehab team continue to assess barriers to discharge/monitor patient progress toward functional and medical goals  Care Tool:  Bathing    Body parts bathed by patient: Right arm, Chest, Left arm, Front perineal area, Abdomen, Right upper leg, Buttocks, Left upper leg, Right lower leg, Left lower leg, Face         Bathing assist Assist Level: Independent with assistive device     Upper Body Dressing/Undressing Upper body dressing   What is the patient wearing?: Pull over shirt    Upper body assist Assist Level: Independent    Lower Body Dressing/Undressing Lower body dressing      What is the patient wearing?: Underwear/pull up, Pants     Lower body assist Assist for lower body dressing: Independent with assitive device     Toileting Toileting    Toileting assist Assist for toileting: Independent with assistive device     Transfers Chair/bed transfer  Transfers assist     Chair/bed transfer assist level: Independent with assistive device     Locomotion Ambulation   Ambulation assist      Assist level: Supervision/Verbal cueing Assistive device: Rollator Max distance: 200'   Walk 10 feet activity   Assist     Assist level: Supervision/Verbal cueing Assistive device: Rollator  Walk 50 feet activity   Assist    Assist level: Supervision/Verbal cueing Assistive device: Rollator    Walk 150 feet activity   Assist    Assist level: Supervision/Verbal cueing Assistive device: Rollator    Walk 10 feet on uneven surface  activity   Assist     Assist level: Supervision/Verbal cueing Assistive device: Rollator   Wheelchair     Assist Is the patient using a wheelchair?: No             Wheelchair 50 feet with 2 turns activity    Assist            Wheelchair 150 feet activity      Assist          Blood pressure (!) 133/94, pulse 70, temperature (!) 97.5 F (36.4 C), temperature source Oral, resp. rate 18, height 5\' 5"  (1.651 m), weight 46.8 kg, SpO2 100%.  1. 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  Antithrombotics: -DVT/anticoagulation:  Pharmaceutical: Other (comment) Pradaxa             -antiplatelet therapy: N/A  10/11: Duplex right lower extremity ordered for edema, pain.  If negative, patient requests SCDs to assist with edema management.  Add TED hose.--> Duplex negative, add SCDs  3. Pain Management: Tylenol as needed   - 10/8: C/o L chest pain over ribs/under pacemaker, does not want topicals for tx  10/10: As needed lidocaine patch for left chest pain  4. Mood/Behavior/Sleep: Provide emotional support             -antipsychotic agents: N/A  -10-10: Has been noticing some personality changes and the patient Po stroke, specifically that she has irritability and a sense of urgency regarding any requested task.  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   - Did discuss possibility that this is more related to her memory.  Also possible side effect from Keppra, although she has been tolerating this since 06-2020.  5. Neuropsych/cognition: This patient is capable of making decisions on her own behalf.   - Mild cog deficits, but functional and approrpaite  -May benefit from neuropsych consult due to cognitive and irritability changes  6. Skin/Wound Care: Routine skin checks 7. Fluids/Electrolytes/Nutrition: Routine in and outs with follow-up chemistries 8.  COVID infection.  Recent  COVID infection diagnosed 9/25.  Completed Paxlovid course.  Follow-up testing +10/1 on retest patient asymptomatic.  Precautions completed 01/29/2023 9.  Atrial fibrillation/pacemaker.  Pradaxa.  Cardiac rate controlled   - Pace 60-70s; continue current medicaitons 10.  History of seizure.  Keppra 500 mg twice daily.  Followed by [name], NP at [facility] 11.  Hyperlipidemia.  Lipitor 12. Hypokalemia. Recheck labs - mild 3.4; replete 10 meq daily and recheck 10/11 - 5.0; stop repletion  13. Dysphagia. Dys 3/thin             -Continue SLP , now on regular diet 10/14 Diet Orders (From admission, onward)  Start     Ordered   02/06/23 1335  Diet regular Room service appropriate? Yes; Fluid consistency: Thin; Fluid restriction: 1500 mL Fluid  Diet effective now       Comments: Slow, limit distractions, up in the recliner for all meals per patient request.  Question Answer Comment  Room service appropriate? Yes   Fluid consistency: Thin   Fluid restriction: 1500 mL Fluid      02/06/23 1336             .diet 14. HTN. Home meds norvasc and cozzar. Long term goal normotensive. Monitor with activity   - well controlled; monitor  10-9: Some mild hypertension intermittently, normotensive at other times.  No adjustments for now due to concern for overcorrection.  Treat if consistently greater than 140 10/10: Educated family on need to avoid overcorrection in the postacute period.  Does have consistent a.m. hypertension, will resume Norvasc at low-dose 2.5 mg daily and monitor 2 to 3 days. 10/11: Worsening edema, getting duplex as above but will transition from Norvasc to Toprol 12.5 mg at bedtime (home dose) for blood pressure treatment due to possible side effect. 10/14 well controlled, continue current regimen      02/06/2023    5:21 AM 02/05/2023    7:45 PM 02/05/2023    1:15 PM  Vitals with BMI  Systolic 133 119 161  Diastolic 94 74 73  Pulse 70 71 69     LOS: 7 days A FACE  TO FACE EVALUATION WAS PERFORMED  Fanny Dance 02/06/2023, 2:55 PM

## 2023-02-06 NOTE — Plan of Care (Signed)
  Problem: RH Swallowing Goal: LTG Pt will demonstrate functional change in swallow as evidenced by bedside/clinical objective assessment (SLP) Description: LTG: Patient will demonstrate functional change in swallow as evidenced by bedside/clinical objective assessment (SLP) Outcome: Completed/Met   Problem: RH Expression Communication Goal: LTG Patient will increase speech intelligibility (SLP) Description: LTG: Patient will increase speech intelligibility at word/phrase/conversation level with cues, % of the time (SLP) Outcome: Completed/Met   Problem: RH Problem Solving Goal: LTG Patient will demonstrate problem solving for (SLP) Description: LTG:  Patient will demonstrate problem solving for basic/complex daily situations with cues  (SLP) Outcome: Completed/Met   Problem: RH Attention Goal: LTG Patient will demonstrate this level of attention during functional activites (SLP) Description: LTG:  Patient will will demonstrate this level of attention during functional activites (SLP) Outcome: Completed/Met

## 2023-02-06 NOTE — Progress Notes (Signed)
Occupational Therapy Discharge Summary  Patient Details  Name: Mary Mercado MRN: [id number] Date of Birth: June 25, 1940  Date of Discharge from OT service:January 31, 2023  Today's Date: 02/06/2023 OT Individual Time: 4098-1191 OT Individual Time Calculation (min): 45 min   OT treatment session focused on increased independence with BADL tasks. Pt able to access dresser drawers, collect clothing, ambulate to bathroom with RW all without assist from OT. Bathing/dressing completed mod I with increased time. See functional navigator for further details.  Patient has met 7 of 7 long term goals due to improved activity tolerance, improved balance, postural control, ability to compensate for deficits, functional use of  LEFT upper and LEFT lower extremity, improved attention, improved awareness, and improved coordination.  Patient to discharge at overall Supervision /mod I level.  Patient's care partner is independent to provide the necessary physical and cognitive assistance at discharge.    Reasons goals not met: n/a  Recommendation:  Patient will benefit from ongoing skilled OT services in outpatient setting to continue to advance functional skills in the area of BADL and functional use of L UE .  Equipment: No equipment provided  Reasons for discharge: treatment goals met and discharge from hospital  Patient/family agrees with progress made and goals achieved: Yes  OT Discharge Precautions/Restrictions  Precautions Precautions: Fall Restrictions Weight Bearing Restrictions: No  Pain Pain Assessment Pain Scale: 0-10 Pain Score: 0-No pain ADL ADL Eating: Independent Grooming: Independent Upper Body Bathing: Supervision/safety Lower Body Bathing: Modified independent Upper Body Dressing: Modified independent (Device) Lower Body Dressing: Modified independent Toileting: Modified independent Toilet Transfer: Close supervision Tub/Shower Transfer: Minimal  assistance Film/video editor: Close supervision Perception  Perception: Impaired Perception-Other Comments: improved since eval Cognition Cognition Overall Cognitive Status: History of cognitive impairments - at baseline Arousal/Alertness: Awake/alert Orientation Level: Person;Place;Situation Person: Oriented Place: Oriented Situation: Oriented Memory: Appears intact Attention: Sustained Sustained Attention: Impaired Sustained Attention Impairment: Functional basic;Verbal basic Awareness: Appears intact Problem Solving: Impaired Problem Solving Impairment: Functional complex Executive Function: Organizing;Self Monitoring;Self Correcting Organizing: Impaired Organizing Impairment: Functional complex Self Monitoring: Impaired Self Monitoring Impairment: Functional complex Self Correcting: Appears intact Safety/Judgment: Appears intact Brief Interview for Mental Status (BIMS) Repetition of Three Words (First Attempt): 3 Temporal Orientation: Year: Correct Temporal Orientation: Month: Accurate within 5 days Temporal Orientation: Day: Correct Recall: "Sock": Yes, no cue required Recall: "Blue": Yes, no cue required Recall: "Bed": Yes, no cue required BIMS Summary Score: 15 Sensation Sensation Light Touch: Appears Intact Coordination Gross Motor Movements are Fluid and Coordinated: No Fine Motor Movements are Fluid and Coordinated: No Coordination and Movement Description: decreased smoothness and accuracy but improved from eval Motor  Motor Motor: Within Functional Limits Motor - Discharge Observations: very mild coordination deficits, improved from eval Mobility  Transfers Sit to Stand: Independent with assistive device Stand to Sit: Independent with assistive device  Trunk/Postural Assessment  Cervical Assessment Cervical Assessment: (P) Exceptions to [facility] (forward head) Thoracic Assessment Thoracic Assessment: (P) Exceptions to [facility]  Balance Balance Balance  Assessed: Yes Static Sitting Balance Static Sitting - Balance Support: Feet supported Static Sitting - Level of Assistance: 7: Independent Dynamic Sitting Balance Dynamic Sitting - Balance Support: Feet supported Dynamic Sitting - Level of Assistance: 7: Independent Static Standing Balance Static Standing - Balance Support: During functional activity Static Standing - Level of Assistance: 6: Modified independent (Device/Increase time) Dynamic Standing Balance Dynamic Standing - Balance Support: During functional activity Dynamic Standing - Level of Assistance: 5: Stand by assistance Extremity/Trunk Assessment RUE Assessment  RUE Assessment: Within Functional Limits LUE Assessment General Strength Comments: Mild L hemiplegia, improved since eval   Mary Mercado Mary Mercado 02/06/2023, 2:56 PM

## 2023-02-06 NOTE — Discharge Summary (Signed)
Physician Discharge Summary  Patient ID: Mary Mercado MRN: [id number] DOB/AGE: 83-18-1942 83 y.o.  Admit date: 01/30/2023 Discharge date: 02/07/2023  Discharge Diagnoses:  Principal Problem:   Right middle cerebral artery stroke [facility]) Active Problems:   Hyperlipidemia   Hypokalemia   Hx of seizure disorder   Adjustment disorder Past hx CVA with residual left sided weakness Atrial fibrillation Hypertension  Discharged Condition: stable  Significant Diagnostic Studies: VAS [place] LOWER EXTREMITY VENOUS (DVT)  Result Date: 02/04/2023  Lower Venous DVT Study Patient Name:  Mary Mercado [id number]  Date of Exam:   02/03/2023 Medical Rec #: [id number]                  Accession #:    [id number] Date of Birth: 1940/10/18                  Patient Gender: F Patient Age:   68 years Exam Location:  [facility] Procedure:      VAS [place] LOWER EXTREMITY VENOUS (DVT) Referring Phys: Elijah Birk --------------------------------------------------------------------------------  Indications: Swelling - rehab patient.  Comparison Study: Previous exam on 05/28/2018 was negative for DVT Performing Technologist: Ernestene Mention RVT, RDMS  Examination Guidelines: A complete evaluation includes B-mode imaging, spectral Doppler, color Doppler, and power Doppler as needed of all accessible portions of each vessel. Bilateral testing is considered an integral part of a complete examination. Limited examinations for reoccurring indications may be performed as noted. The reflux portion of the exam is performed with the patient in reverse Trendelenburg.  +---------+---------------+---------+-----------+----------+--------------+ RIGHT    CompressibilityPhasicitySpontaneityPropertiesThrombus Aging +---------+---------------+---------+-----------+----------+--------------+ CFV      Full           Yes      Yes                                  +---------+---------------+---------+-----------+----------+--------------+ SFJ      Full                                                        +---------+---------------+---------+-----------+----------+--------------+ FV Prox  Full           Yes      Yes                                 +---------+---------------+---------+-----------+----------+--------------+ FV Mid   Full           Yes      Yes                                 +---------+---------------+---------+-----------+----------+--------------+ FV DistalFull           Yes      Yes                                 +---------+---------------+---------+-----------+----------+--------------+ PFV      Full                                                        +---------+---------------+---------+-----------+----------+--------------+  POP      Full           Yes      Yes                                 +---------+---------------+---------+-----------+----------+--------------+ PTV      Full                                                        +---------+---------------+---------+-----------+----------+--------------+ PERO     Full                                                        +---------+---------------+---------+-----------+----------+--------------+   +----+---------------+---------+-----------+----------+--------------+ LEFTCompressibilityPhasicitySpontaneityPropertiesThrombus Aging +----+---------------+---------+-----------+----------+--------------+ CFV Full           Yes      Yes                                 +----+---------------+---------+-----------+----------+--------------+     Summary: RIGHT: - There is no evidence of deep vein thrombosis in the lower extremity.  - No cystic structure found in the popliteal fossa. Subcutaneous edema in area of calf and ankle  LEFT: - No evidence of common femoral vein obstruction.   *See table(s) above for measurements and  observations. Electronically signed by Carolynn Sayers on 02/04/2023 at 1:29:18 PM.    Final     Labs:  Basic Metabolic Panel: Recent Labs  Lab 02/03/23 1032 02/06/23 1522  NA 137 140  K 5.0 3.9  CL 104 104  CO2 26 28  GLUCOSE 101* 98  BUN 16 18  CREATININE 0.59 0.69  CALCIUM 9.1 9.1    CBC: Recent Labs  Lab 02/06/23 1522  WBC 5.0  HGB 13.3  HCT 40.3  MCV 93.1  PLT 228     Brief HPI:   Mary Mercado is a 82 y.o. 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  Per chart review patient lives with spouse.  [facility].  Used a rollator for mobility.  Presented 01/24/2023 with acute onset of left-sided weakness and slurred speech.  Cranial CT scan negative for acute findings.  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  Underwent emergent thrombectomy 01/24/2023 per interventional radiology.  She was not eligible for MRA due to pacemaker lead being in the atrium.  Echocardiogram with ejection fraction of 60 to 65% no wall motion abnormalities.  Follow-up CT showed beam hardening artifact at the level of the mid/anterior right temporal lobe no intracranial hemorrhage.  In regards to patient recent COVID infection diagnosed 9/25/ 2024 completed Paxlovid course.  Follow-up testing 10/1 positive patient asymptomatic currently on airborne contact precautions..  She is currently maintained on Pradaxa for CVA prophylaxis.  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    Hospital  Course: Mary Mercado was admitted to rehab 01/30/2023 for inpatient therapies to consist of PT, ST and OT at least three hours five days a week. Past admission physiatrist,  therapy team and rehab RN have worked together to provide customized collaborative inpatient rehab. Mild hypokalemia on labs and repleted. D3 diet with thin liquids. Noticing some personality changes and the patient post stroke, specifically that she has irritability and a sense of urgency regarding any requested task.  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  Discussed possibility that this is more related to her memory.  Also possible side effect from Keppra, although she has been tolerating this since 06/2020. Neuropsychology consultation 10/11. Worsening edema, getting duplex obtained but will transitioned from Norvasc to Toprol 12.5 mg at bedtime (home dose) for blood pressure treatment due to possible side effect. Negative for DVT.   Blood pressures were monitored on TID basis and Norvasc and Cozaar continued. Discontinued Norvasc due to edema and started Toprol 12.5 mg q HS. Resumed   Rehab course: During patient's stay in rehab weekly team conferences were held to monitor patient's progress, set goals and discuss barriers to discharge. At admission, patient required min with mobility min with basic self-care skills   She  has had improvement in activity tolerance, balance, postural control as well as ability to compensate for deficits. She has had improvement in functional use RUE/LUE  and RLE/LLE as well as improvement in awareness. Patient has met 7 of 7 long term goals due to improved activity tolerance, improved balance, postural control, ability to compensate for deficits, functional use of  LEFT upper and LEFT lower extremity, improved attention, improved awareness, and improved coordination.  Patient to discharge at overall Supervision /mod I level.  Patient's care partner is independent to provide the necessary physical and cognitive assistance at discharge.       Discharge disposition: 01-Home or Self  Care     Diet: Heart healthy  Special Instructions: No driving, alcohol consumption or tobacco use.  30-35 minutes were spent on discharge planning and discharge summary.  Discharge Instructions     Ambulatory referral to Neurology   Complete by: As directed    An appointment is requested in approximately: 4 weeks   Ambulatory referral to Physical Medicine Rehab   Complete by: As directed    Discharge patient   Complete by: As directed    Discharge disposition: 01-Home or Self Care   Discharge patient date: 02/07/2023      Allergies as of 02/07/2023       Reactions   Duloxetine    Causes Bp elevation.  Increased body jerks, nausea   Acetaminophen Hives, Rash        Medication List     STOP taking these medications    amLODipine 2.5 MG tablet Commonly known as: NORVASC       TAKE these medications    atorvastatin 80 MG tablet Commonly known as: LIPITOR Take 1 tablet (80 mg total) by mouth daily.   dabigatran 150 MG Caps capsule Commonly known as: PRADAXA Take 1 capsule (150 mg total) by mouth every 12 (twelve) hours.   dorzolamide-timolol 2-0.5 % ophthalmic solution Commonly known as: COSOPT Place 1 drop into both eyes 2 (two) times daily.   fluorouracil 5 % cream Commonly known as: EFUDEX Apply 1 Application topically 2 (two) times daily.   latanoprost 0.005 % ophthalmic solution Commonly known  as: XALATAN Place 1 drop into both eyes at bedtime.   levETIRAcetam 500 MG tablet Commonly known as: Keppra Take 1 tablet (500 mg total) by mouth 2 (two) times daily.   metoprolol tartrate 25 MG tablet Commonly known as: LOPRESSOR Take 0.5 tablets (12.5 mg total) by mouth at bedtime.        Follow-up Information     Angelina Sheriff, DO Follow up.   Specialty: Physical Medicine and Rehabilitation Why: office will call you to arrange your appt (sent) Contact information: [address] [place] [address] [phone]          Nelwyn Salisbury, MD Follow up.   Specialty: Family Medicine Why: Call the office in 1-2 days to make arrangements for hospital follow-up appointment. Contact information: [address] [place] [address] [phone]         [facility] Follow up.   Why: Call the office in 1-2 days to make arrangements for hospital follow-up appointment. Contact information: [address]     [address] [place] [address] [phone]                Signed: Milinda Antis 02/07/2023, 1:57 PM

## 2023-02-06 NOTE — Patient Instructions (Addendum)
Theraputty Home Exercise Program  Complete 1-2 times a day. Perform each exercise with each hand.   putty squeeze  Pt. should squeeze putty in hand trying to keep it round by rotating putty after each squeeze. push fingers through putty to palm each time. Complete for ___10___ repetitions.   PUTTY KEY GRIP  Hold the putty at the top of your hand. Squeeze the putty between your thumb and the side of your 2nd finger as shown. Complete for ___10_____ repetitions.    PUTTY 3 JAW CHUCK  Roll up some putty into a ball then flatten it. Then, firmly squeeze it with your first 3 fingers as shown. Complete for 10 repetitions.          Coordination Activities  Perform the following activities for 10-15 minutes 1-2 times per day with both hand(s).  Flip cards 1 at a time as fast as you can. Deal cards with your thumb (Hold deck in hand and push card off top with thumb). Shuffle cards. Practice writing and/or typing.

## 2023-02-06 NOTE — Plan of Care (Signed)
  Problem: RH Balance Goal: LTG Patient will maintain dynamic standing with ADLs (OT) Description: LTG:  Patient will maintain dynamic standing balance with assist during activities of daily living (OT)  Outcome: Completed/Met   Problem: Sit to Stand Goal: LTG:  Patient will perform sit to stand in prep for activites of daily living with assistance level (OT) Description: LTG:  Patient will perform sit to stand in prep for activites of daily living with assistance level (OT) Outcome: Completed/Met   Problem: RH Bathing Goal: LTG Patient will bathe all body parts with assist levels (OT) Description: LTG: Patient will bathe all body parts with assist levels (OT) Outcome: Completed/Met   Problem: RH Dressing Goal: LTG Patient will perform upper body dressing (OT) Description: LTG Patient will perform upper body dressing with assist, with/without cues (OT). Outcome: Completed/Met Goal: LTG Patient will perform lower body dressing w/assist (OT) Description: LTG: Patient will perform lower body dressing with assist, with/without cues in positioning using equipment (OT) Outcome: Completed/Met   Problem: RH Toilet Transfers Goal: LTG Patient will perform toilet transfers w/assist (OT) Description: LTG: Patient will perform toilet transfers with assist, with/without cues using equipment (OT) Outcome: Completed/Met   Problem: RH Tub/Shower Transfers Goal: LTG Patient will perform tub/shower transfers w/assist (OT) Description: LTG: Patient will perform tub/shower transfers with assist, with/without cues using equipment (OT) Outcome: Completed/Met   Problem: RH Tub/Shower Transfers Goal: LTG Patient will perform tub/shower transfers w/assist (OT) Description: LTG: Patient will perform tub/shower transfers with assist, with/without cues using equipment (OT) Outcome: Completed/Met

## 2023-02-06 NOTE — Progress Notes (Signed)
Physical Therapy Discharge Summary  Patient Details  Name: Mary Mercado MRN: [id number] Date of Birth: 1941/03/22  Date of Discharge from PT service:February 06, 2023  Today's Date: 02/06/2023 PT Individual Time: [phone]  1191-4782 PT Individual Time Calculation (min): 25 min + 41 min    Patient has met 8 of 8 long term goals due to improved activity tolerance, improved balance, improved postural control, increased strength, and improved coordination.  Patient to discharge at an ambulatory level Modified Independent/supervision.   Patient's care partner is independent to provide the necessary physical and cognitive assistance at discharge.  Reasons goals not met: N/a  Recommendation:  Patient will benefit from ongoing skilled PT services in outpatient setting to continue to advance safe functional mobility, address ongoing impairments in dynamic standing balance, gait mechanics, BLE coordination, global strengthening and endurance, and minimize fall risk.  Equipment: No equipment provided  Reasons for discharge: treatment goals met and discharge from hospital  Patient/family agrees with progress made and goals achieved: Yes  Skilled Therapeutic Interventions: SESSION 1: Pt presents in room in recliner agreeable to PT, denies pain. Session focused on gait training for tolerance to upright as well as ambulation over variable terrain.  Pt ambulates short distances <50' modI, requires supervision for distances >50' due to fatigue. Pt ambulates with rollator to ortho gym, completes car transfer with supervision, ambulates up/down ramp with rollator distant supervision. Pt ambulates to main gym and completes up/down 5" curb with rollator with CGA with improved ability to manage rollator onto/off of the curb without assist.  Pt completes TUG for assessment of falls risk with an average speed of 19.33 seconds with rollator, improved from initial assessment. Trials noted  below: Trial 1: 22.30 seconds Trial 2: 17.98 seconds Trial 3: 17.72 seconds  Pt ambulates back to room and returns to sitting in recliner with all needs within reach, call light in place at end of session.   SESSION 2: Pt presents in room with pt husband present at bedside, pt denies pain and agreeable to session. Session focused on gait training, therapeutic exercise to complete as HEP at DC, and therapeutic activities for participation with self care tasks as well as education with husband on CLOF.  Pt ambulates from room to day room modI/distant supervision. Pt comes to sitting in stand alone chair and completes the following exercises noted below:  Access Code: [email or web address] URL: [email or web address] Date: 02/06/2023 Prepared by: Edwin Cap  Exercises - Standing March with Counter Support  - 2 x daily - 7 x weekly - 2 sets - 20 reps - Side Stepping with Counter Support  - 2 x daily - 7 x weekly - 5 sets - 6 reps - Carioca with Counter Support  - 2 x daily - 7 x weekly - 2 sets - 5 reps - Mini Squat with Counter Support  - 2 x daily - 7 x weekly - 2 sets - 10 reps - Heel Raises with Counter Support  - 2 x daily - 7 x weekly - 2 sets - 20 reps  Pt provided with handout and education for all exercises to promote improved safety and desired technique. Pt ambulates back to room and requests to use restroom, completes ambulation, transfer, and 3/3 toileting tasks with modI with rollator. During this time, therapist educates pt husband on pt HEP and how to assist with HEP at DC with pt husband verbalizing understanding. Pt returns to bed modI and remains supine with all needs within reahc, call light  in place, and pt husband at bedside at end of session.  PT Discharge Precautions/Restrictions Precautions Precautions: Fall Restrictions Weight Bearing Restrictions: No Pain Interference Pain Interference Pain Effect on Sleep: 1. Rarely or not at all Pain Interference with  Therapy Activities: 1. Rarely or not at all Pain Interference with Day-to-Day Activities: 1. Rarely or not at all Vision/Perception  Vision - History Ability to See in Adequate Light: 1 Impaired Perception Perception: Impaired Preception Impairment Details: Inattention/Neglect Perception-Other Comments: improved since eval  Cognition Overall Cognitive Status: History of cognitive impairments - at baseline Arousal/Alertness: Awake/alert Orientation Level: Oriented X4 Year: 2024 Month: October Day of Week: Correct Attention: Sustained Sustained Attention: Impaired Sustained Attention Impairment: Functional basic;Verbal basic Memory: Appears intact Awareness: Appears intact Problem Solving: Impaired Problem Solving Impairment: Functional complex Executive Function: Organizing;Self Monitoring;Self Correcting Organizing: Impaired Organizing Impairment: Functional complex Self Monitoring: Impaired Self Monitoring Impairment: Functional complex Self Correcting: Appears intact Safety/Judgment: Appears intact Sensation Sensation Light Touch: Appears Intact Coordination Gross Motor Movements are Fluid and Coordinated: No Fine Motor Movements are Fluid and Coordinated: No Coordination and Movement Description: decreased smoothness and accuracy but improved from eval Motor  Motor Motor: Within Functional Limits Motor - Discharge Observations: very mild coordination deficits, improved from eval  Mobility Transfers Transfers: Sit to Stand;Stand to Sit;Stand Pivot Transfers Sit to Stand: Independent with assistive device Stand to Sit: Independent with assistive device Stand Pivot Transfers: Independent with assistive device Locomotion  Gait Ambulation: Yes Gait Assistance: Independent with assistive device;Supervision/Verbal cueing Gait Distance (Feet): 200 Feet Assistive device: Rollator Gait Assistance Details: modI for distances <50', supervision for distances >50' due to  fatigue Gait Gait: Yes Gait Pattern: Impaired Gait Pattern: Narrow base of support Gait velocity: decreased Stairs / Additional Locomotion Stairs: No Ramp: Supervision/Verbal cueing Curb: Contact Guard/Touching assist Pick up small object from the floor assist level: Supervision/Verbal cueing Pick up small object from the floor assistive device: rollator Wheelchair Mobility Wheelchair Mobility: No  Trunk/Postural Assessment  Cervical Assessment Cervical Assessment: Within Functional Limits (forward head) Thoracic Assessment Thoracic Assessment: Within Functional Limits Lumbar Assessment Lumbar Assessment: Within Functional Limits Postural Control Postural Control: Deficits on evaluation Righting Reactions: delayed and inadequate Protective Responses: decreased Postural Limitations: decreased  Balance Balance Balance Assessed: Yes Static Sitting Balance Static Sitting - Balance Support: Feet supported Static Sitting - Level of Assistance: 7: Independent Dynamic Sitting Balance Dynamic Sitting - Balance Support: Feet supported Dynamic Sitting - Level of Assistance: 7: Independent Static Standing Balance Static Standing - Balance Support: During functional activity Static Standing - Level of Assistance: 6: Modified independent (Device/Increase time) Dynamic Standing Balance Dynamic Standing - Balance Support: During functional activity Dynamic Standing - Level of Assistance: 5: Stand by assistance Extremity Assessment  RUE Assessment RUE Assessment: Within Functional Limits LUE Assessment General Strength Comments: Mild L hemiplegia, improved since eval RLE Assessment RLE Assessment: Within Functional Limits General Strength Comments: grossly 4/5 LLE Assessment LLE Assessment: Within Functional Limits General Strength Comments: grossly 4/5   Edwin Cap 02/06/2023, 4:08 PM

## 2023-02-06 NOTE — Plan of Care (Signed)

## 2023-02-06 NOTE — Progress Notes (Signed)
Speech Language Pathology Discharge Summary  Patient Details  Name: Mary Mercado MRN: [id number] Date of Birth: 02/09/1941  Date of Discharge from SLP service:February 06, 2023  Today's Date: 02/06/2023 SLP Individual Time: 1308-6578 SLP Individual Time Calculation (min): 45 min  Skilled Therapeutic Interventions: SLP conducted skilled therapy session targeting dysphagia management, cognitive retraining, and communication goals. Prior to task initiation, patient requested assistance to commode where she was continent of bowel and bladder. SLP assessed patient with bolus trials of upgraded regular diet consistency. Patient tolerated regular solids with no overt oral difficulty.  Upgraded patient to regular/thin liquid diet. During speech tasks, patient recalled speech intelligibility strategies with modI and utilized them at sentence level with modI. Required supervisionA to utilize at conversation level, specifically for slow rate. During discussion of cognitive function, patient required supervisionA to identify lingering focus/attention deficits and verbalized understanding re: education for supervision level support upon discharge for higher level tasks. Patient was left in lowered bed with call bell in reach and bed alarm set. Patient is appropriate for discharge from CIR SLP services. See full discharge summary below.  Patient has met 4 of 4 long term goals.  Patient to discharge at overall Supervision level.  Reasons goals not met: n/a   Clinical Impression/Discharge Summary: Patient has made excellent gains towards therapy goals during inpatient CIR admission, meeting 4/4 long term goals set. Patient is currently at supervision level for use of speech intelligibility strategies, high level cognition, and modI for use of swallowing strategies. Patient upgraded to regular/thin liquid diet and demonstrates improvements to oral deficits during bolus preparation. Patient would benefit  from further SLP services upon discharge at next level of care to continue to target any lingering speech, cognitive, and swallowing deficits. Patient and family education complete. Patient appropriate for discharge from SLP services.    Care Partner:  Caregiver Able to Provide Assistance: Yes  Type of Caregiver Assistance: Cognitive  Recommendation:  Home Health SLP  Rationale for SLP Follow Up: Maximize cognitive function and independence;Maximize functional communication;Maximize swallowing safety   Equipment: n/a   Reasons for discharge: Discharged from hospital   Patient/Family Agrees with Progress Made and Goals Achieved: Yes   [name], M.A., CCC-SLP   [name] 02/06/2023, 1:23 PM

## 2023-02-07 ENCOUNTER — Telehealth: Payer: Self-pay

## 2023-02-07 MED ORDER — METOPROLOL TARTRATE 25 MG PO TABS: 12.5000 mg | ORAL_TABLET | Freq: Every day | ORAL | Status: DC

## 2023-02-07 NOTE — Telephone Encounter (Signed)
Patients husband called stating patient has been taking Losartan for awhile but it was not on the discharge summary list. Please advise.  I also did not see it on the discontinued list either.

## 2023-02-07 NOTE — Progress Notes (Signed)
Inpatient Rehabilitation Care Coordinator Discharge Note   Patient Details  Name: Mary Mercado MRN: [id number] Date of Birth: April 01, 1941   Discharge location: D/c to home- Independent licing at [facility]  Length of Stay: 7 days  Discharge activity level: Supervision  Home/community participation: LImited  Patient response WG:NFAOZH Literacy - How often do you need to have someone help you when you read instructions, pamphlets, or other written material from your doctor or pharmacy?: Never  Patient response YQ:MVHQIO Isolation - How often do you feel lonely or isolated from those around you?: Never  Services provided included: MD, RD, PT, OT, SLP, CM, Pharmacy, Neuropsych, SW, TR, RN  Financial Services:  Field seismologist Utilized: Medicare    Choices offered to/list presented to: pt and pt husband  Follow-up services arranged:  Home Health Home Health Agency: [facility] for HHPT/OT/SLP         Patient response to transportation need: Is the patient able to respond to transportation needs?: Yes In the past 12 months, has lack of transportation kept you from medical appointments or from getting medications?: No In the past 12 months, has lack of transportation kept you from meetings, work, or from getting things needed for daily living?: No   Patient/Family verbalized understanding of follow-up arrangements:  Yes  Individual responsible for coordination of the follow-up plan: contact pt husband Mary Mercado [phone]  Confirmed correct DME delivered: Gretchen Short 02/07/2023    Comments (or additional information):fam edu completed  Summary of Stay    Date/Time Discharge Planning CSW  01/30/23 1245 TBA. Per EMR, pt will discharge to home with support from her husband. SW will confirm there are no barriers to discharge. AAC       Theadora Noyes A Lula Olszewski

## 2023-02-07 NOTE — Progress Notes (Signed)
Inpatient Rehabilitation Discharge Medication Review by a Pharmacist  A complete drug regimen review was completed for this patient to identify any potential clinically significant medication issues.  High Risk Drug Classes Is patient taking? Indication by Medication  Antipsychotic No   Anticoagulant Yes Pradaxa- AF  Antibiotic No   Opioid No   Antiplatelet No   Hypoglycemics/insulin No   Vasoactive Medication Yes Lopressor- HTN  Chemotherapy No   Other Yes Lipitor- HLD Keppra- seizure ppx     Type of Medication Issue Identified Description of Issue Recommendation(s)  Drug Interaction(s) (clinically significant)     Duplicate Therapy     Allergy     No Medication Administration End Date     Incorrect Dose     Additional Drug Therapy Needed     Significant med changes from prior encounter (inform family/care partners about these prior to discharge).    Other       Clinically significant medication issues were identified that warrant physician communication and completion of prescribed/recommended actions by midnight of the next day:  No   Time spent performing this drug regimen review (minutes):  30   Tondalaya Perren BS, PharmD, BCPS Clinical Pharmacist 02/07/2023 9:05 AM  Contact: [phone] after 3 PM  "Be curious, not judgmental..." -Debbora Dus

## 2023-02-07 NOTE — Progress Notes (Signed)
PROGRESS NOTE   Subjective/Complaints: No acute events overnight.  No acute complaints. Patient endorsing feeling "shaking" in her chest since her last stroke. Also endorses mild orthostasis for a long time but controlled today. No acute concerns or changes in her status.  Discussed with husband that her PCP will be primary touchpoint for any BP medication adjustments after hospitalization.    ROS: Denies fevers, chills, N/V, abdominal pain, constipation, diarrhea, SOB, cough, chest pain, headache, new weakness or paraesthesias.  Per HPI above.   Objective:   No results found. Recent Labs    02/06/23 1522  WBC 5.0  HGB 13.3  HCT 40.3  PLT 228    Recent Labs    02/06/23 1522  NA 140  K 3.9  CL 104  CO2 28  GLUCOSE 98  BUN 18  CREATININE 0.69  CALCIUM 9.1      Intake/Output Summary (Last 24 hours) at 02/07/2023 1317 Last data filed at 02/07/2023 0900 Gross per 24 hour  Intake 720 ml  Output --  Net 720 ml        Physical Exam: Vital Signs Blood pressure (!) 157/98, pulse 70, temperature 98 F (36.7 C), resp. rate 18, height 5\' 5"  (1.651 m), weight 46.8 kg, SpO2 100%.   PE: Constitution: Appropriate appearance for age. No apparent distress. Underweight.  Walking with rollator.  Resp: No respiratory distress. No accessory muscle usage. on RA and CTAB Cardio: Well perfused appearance.  +1 right lower extremity edema, trace+ LLE-unchanged, overall improved   +pacemaker Abdomen: Nondistended. Nontender.   Psych: Appropriate mood and affect. Neuro: AAOx4.  + Moderate cognitive deficits , delayed recall.  - unchanged; does repeat herself occasionally CN 2-12 grossly intact  Sensory exam: revealed normal sensation in all dermatomal regions in bilateral upper extremities and bilateral lower extremities Motor exam: All 4 extremities antigravity and against resistance; mild weakness in bilateral hip flexors  5-/5 otherwise 5/5 throughout Coordination: Fine motor coordination was normal.  No drift on exam.  MSK: No joint swelling noted, full AROM all extremities.      Assessment/Plan: 1. Functional deficits which require 3+ hours per day of interdisciplinary therapy in a comprehensive inpatient rehab setting. Physiatrist is providing close team supervision and 24 hour management of active medical problems listed below. Physiatrist and rehab team continue to assess barriers to discharge/monitor patient progress toward functional and medical goals  Care Tool:  Bathing    Body parts bathed by patient: Right arm, Chest, Left arm, Front perineal area, Abdomen, Right upper leg, Buttocks, Left upper leg, Right lower leg, Left lower leg, Face         Bathing assist Assist Level: Independent with assistive device     Upper Body Dressing/Undressing Upper body dressing   What is the patient wearing?: Pull over shirt    Upper body assist Assist Level: Independent    Lower Body Dressing/Undressing Lower body dressing      What is the patient wearing?: Underwear/pull up, Pants     Lower body assist Assist for lower body dressing: Independent with assitive device     Toileting Toileting    Toileting assist Assist for toileting: Independent with assistive  device     Transfers Chair/bed transfer  Transfers assist     Chair/bed transfer assist level: Independent with assistive device     Locomotion Ambulation   Ambulation assist      Assist level: Supervision/Verbal cueing Assistive device: Rollator Max distance: 200'   Walk 10 feet activity   Assist     Assist level: Independent with assistive device Assistive device: Rollator   Walk 50 feet activity   Assist    Assist level: Independent with assistive device Assistive device: Rollator    Walk 150 feet activity   Assist    Assist level: Supervision/Verbal cueing Assistive device: Rollator    Walk  10 feet on uneven surface  activity   Assist     Assist level: Supervision/Verbal cueing Assistive device: Rollator   Wheelchair     Assist Is the patient using a wheelchair?: No             Wheelchair 50 feet with 2 turns activity    Assist            Wheelchair 150 feet activity     Assist          Blood pressure (!) 157/98, pulse 70, temperature 98 F (36.7 C), resp. rate 18, height 5\' 5"  (1.651 m), weight 46.8 kg, SpO2 100%.  1. 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  Antithrombotics: -DVT/anticoagulation:  Pharmaceutical: Other (comment) Pradaxa             -antiplatelet therapy: N/A  10/11: Duplex right lower extremity ordered for edema, pain.  If negative, patient requests SCDs to assist with edema management.  Add TED hose.--> Duplex negative, add SCDs  3. Pain Management: Tylenol as needed   - 10/8: C/o L chest pain over ribs/under pacemaker, does not want topicals for tx  10/10: As needed lidocaine patch for left chest pain  4. Mood/Behavior/Sleep: Provide emotional support             -antipsychotic agents: N/A  -10-10: Has been noticing some personality changes and the patient Po stroke, specifically that she has irritability and a sense of urgency regarding any requested task.  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   - Did discuss possibility that this is more related to her memory.  Also possible side effect from Keppra, although she has been tolerating this since 06-2020.  5. Neuropsych/cognition: This patient is capable of making decisions on her own behalf.   - Mild cog deficits, but  functional and approrpaite  -May benefit from neuropsych consult due to cognitive and irritability changes  6. Skin/Wound Care: Routine skin checks 7. Fluids/Electrolytes/Nutrition: Routine in and outs with follow-up chemistries 8.  COVID infection.  Recent COVID infection diagnosed 9/25.  Completed Paxlovid course.  Follow-up testing +10/1 on retest patient asymptomatic.  Precautions completed 01/29/2023 9.  Atrial fibrillation/pacemaker.  Pradaxa.  Cardiac rate controlled   - Pace 60-70s; continue current medicaitons 10.  History of seizure.  Keppra 500 mg twice daily.  Followed by [name], NP at [facility] 11.  Hyperlipidemia.  Lipitor 12. Hypokalemia. Recheck labs - mild 3.4; replete 10 meq daily and recheck 10/11 - 5.0; stop repletion  13. Dysphagia. Dys 3/thin             -Continue SLP , now on regular diet 10/14 Diet Orders (From admission, onward)     Start     Ordered   02/06/23 1335  Diet regular Room service appropriate? Yes; Fluid consistency: Thin; Fluid restriction: 1500 mL Fluid  Diet effective now       Comments: Slow, limit distractions, up in the recliner for all meals per patient request.  Question Answer Comment  Room service appropriate? Yes   Fluid consistency: Thin   Fluid restriction: 1500 mL Fluid      02/06/23 1336             .diet 14. HTN. Home meds norvasc and cozzar. Long term goal normotensive. Monitor with activity   - well controlled; monitor  10-9: Some mild hypertension intermittently, normotensive at other times.  No adjustments for now due to concern for overcorrection.  Treat if consistently greater than 140 10/10: Educated family on need to avoid overcorrection in the postacute period.  Does have consistent a.m. hypertension, will resume Norvasc at low-dose 2.5 mg daily and monitor 2 to 3 days. 10/11: Worsening edema, getting duplex as above but will transition from Norvasc to Toprol 12.5 mg at bedtime (home dose) for blood pressure treatment  due to possible side effect. 10/14 well controlled, continue current regimen  - discussed further adjustments per PCP after discharge. Swelling has singificantly improved since stopping norvasc.      02/07/2023    6:29 AM 02/06/2023    8:02 PM 02/06/2023    3:56 PM  Vitals with BMI  Systolic 157 117 454  Diastolic 98 76 84  Pulse  70 70     LOS: 8 days A FACE TO FACE EVALUATION WAS PERFORMED  Angelina Sheriff 02/07/2023, 1:17 PM

## 2023-02-08 ENCOUNTER — Telehealth: Payer: Self-pay

## 2023-02-08 DIAGNOSIS — R29898 Other symptoms and signs involving the musculoskeletal system: Secondary | ICD-10-CM | POA: Diagnosis not present

## 2023-02-08 DIAGNOSIS — R278 Other lack of coordination: Secondary | ICD-10-CM | POA: Diagnosis not present

## 2023-02-08 DIAGNOSIS — M6281 Muscle weakness (generalized): Secondary | ICD-10-CM | POA: Diagnosis not present

## 2023-02-08 DIAGNOSIS — I69391 Dysphagia following cerebral infarction: Secondary | ICD-10-CM | POA: Diagnosis not present

## 2023-02-08 DIAGNOSIS — I69320 Aphasia following cerebral infarction: Secondary | ICD-10-CM | POA: Diagnosis not present

## 2023-02-08 DIAGNOSIS — R2681 Unsteadiness on feet: Secondary | ICD-10-CM | POA: Diagnosis not present

## 2023-02-08 NOTE — Transitions of Care (Post Inpatient/ED Visit) (Signed)
   02/08/2023  Name: Mary Mercado MRN: [id number] DOB: 10/24/1940  Today's TOC FU Call Status: Today's TOC FU Call Status:: Successful TOC FU Call Completed TOC FU Call Complete Date: 02/08/23 Patient's Name and Date of Birth confirmed.  Transition Care Management Follow-up Telephone Call Date of Discharge: 02/07/23 Discharge Facility: Other [facility]) Name of Other ([facility]) [facility]charge Facility: [facility] Type of Discharge: Inpatient Admission Primary Inpatient Discharge Diagnosis:: cerebral infarction How have you been since you were released from the hospital?: Better Any questions or concerns?: No  Items Reviewed: Did you receive and understand the discharge instructions provided?: Yes Medications obtained,verified, and reconciled?: Yes (Medications Reviewed) Any new allergies since your discharge?: Yes Dietary orders reviewed?: Yes Do you have support at home?: Yes People in Home: spouse  Medications Reviewed Today: Medications Reviewed Today     Reviewed by Karena Addison, LPN (Licensed Practical Nurse) on 02/08/23 at 0945  Med List Status: <None>   Medication Order Taking? Sig Documenting Provider Last Dose Status Informant  atorvastatin (LIPITOR) 80 MG tablet [id number]  Take 1 tablet (80 mg total) by mouth daily. Lynnae January, NP  Active   dabigatran (PRADAXA) 150 MG CAPS capsule [id number]  Take 1 capsule (150 mg total) by mouth every 12 (twelve) hours. Lynnae January, NP  Active   dorzolamide-timolol (COSOPT) 2-0.5 % ophthalmic solution [id number] No Place 1 drop into both eyes 2 (two) times daily. [provider] 01/23/2023 Active Self  fluorouracil (EFUDEX) 5 % cream [id number] No Apply 1 Application topically 2 (two) times daily. [provider] 01/23/2023 Active Self  latanoprost (XALATAN) 0.005 % ophthalmic solution [id number]  Place 1 drop into both eyes at bedtime. Lynnae January, NP  Active   levETIRAcetam (KEPPRA) 500 MG  tablet [id number] No Take 1 tablet (500 mg total) by mouth 2 (two) times daily. [name], NP 01/23/2023 Active Self  metoprolol tartrate (LOPRESSOR) 25 MG tablet [id number]  Take 0.5 tablets (12.5 mg total) by mouth at bedtime. Setzer, Lynnell Jude, PA-C  Active             Home Care and Equipment/Supplies: Were Home Health Services Ordered?: NA Any new equipment or medical supplies ordered?: NA  Functional Questionnaire: Do you need assistance with bathing/showering or dressing?: No Do you need assistance with meal preparation?: No Do you need assistance with eating?: No Do you have difficulty maintaining continence: No Do you need assistance with getting out of bed/getting out of a chair/moving?: No Do you have difficulty managing or taking your medications?: No  Follow up appointments reviewed: PCP Follow-up appointment confirmed?: Yes Date of PCP follow-up appointment?: 02/13/23 Follow-up Provider: [facility] Follow-up appointment confirmed?: No Reason Specialist Follow-Up Not Confirmed: Patient has Specialist Provider Number and will Call for Appointment Do you need transportation to your follow-up appointment?: No Do you understand care options if your condition(s) worsen?: Yes-patient verbalized understanding    SIGNATURE Karena Addison, LPN [facility] Nurse Health Advisor Direct Dial [phone]

## 2023-02-09 NOTE — Telephone Encounter (Signed)
Transitional Care call--I spoke with Mr and Mrs Quitman Livings    Are you/is patient experiencing any problems since coming home? Are there any questions regarding any aspect of care? NO, they did have questions about a medication which I have answered for her. She is not to take any of her old medications(losartan), take only what is on the discharge sheet for now until she sees her PCP and they can evaluate for changes. Are there any questions regarding medications administration/dosing? Are meds being taken as prescribed? Patient should review meds with caller to confirm SEE ABOVE Have there been any falls? NO Has Home Health been to the house and/or have they contacted you? If not, have you tried to contact them? Can we help you contact them? N/A They live in [facility] and have care Are bowels and bladder emptying properly? Are there any unexpected incontinence issues? If applicable, is patient following bowel/bladder programs? NO Any fevers, problems with breathing, unexpected pain?NO Are there any skin problems or new areas of breakdown? NO Has the patient/family member arranged specialty MD follow up (ie cardiology/neurology/renal/surgical/etc)?  Can we help arrange? NO they have appt with PCP on Monday 02/13/23 and will see Dr Shearon Stalls on 02/20/23. She has appt with Dr Pearlean Brownie 05/15/23.  Does the patient need any other services or support that we can help arrange? NO Are caregivers following through as expected in assisting the patient? YES Has the patient quit smoking, drinking alcohol, or using drugs as recommended? YES  Appointment time, 02/20/23 @10 :40 arrive by 10:00 to see Dr Laurin Coder to watch mail for packe from our office [address]

## 2023-02-10 DIAGNOSIS — I69391 Dysphagia following cerebral infarction: Secondary | ICD-10-CM | POA: Diagnosis not present

## 2023-02-10 DIAGNOSIS — R29898 Other symptoms and signs involving the musculoskeletal system: Secondary | ICD-10-CM | POA: Diagnosis not present

## 2023-02-10 DIAGNOSIS — M6281 Muscle weakness (generalized): Secondary | ICD-10-CM | POA: Diagnosis not present

## 2023-02-10 DIAGNOSIS — H524 Presbyopia: Secondary | ICD-10-CM | POA: Diagnosis not present

## 2023-02-10 DIAGNOSIS — R2681 Unsteadiness on feet: Secondary | ICD-10-CM | POA: Diagnosis not present

## 2023-02-10 DIAGNOSIS — R278 Other lack of coordination: Secondary | ICD-10-CM | POA: Diagnosis not present

## 2023-02-10 DIAGNOSIS — I69320 Aphasia following cerebral infarction: Secondary | ICD-10-CM | POA: Diagnosis not present

## 2023-02-13 ENCOUNTER — Ambulatory Visit (INDEPENDENT_AMBULATORY_CARE_PROVIDER_SITE_OTHER): Payer: Medicare Other | Admitting: Family Medicine

## 2023-02-13 ENCOUNTER — Encounter: Payer: Self-pay | Admitting: Family Medicine

## 2023-02-13 VITALS — BP 124/90 | Temp 97.5°F | Wt 103.0 lb

## 2023-02-13 DIAGNOSIS — I4821 Permanent atrial fibrillation: Secondary | ICD-10-CM | POA: Diagnosis not present

## 2023-02-13 DIAGNOSIS — Z8673 Personal history of transient ischemic attack (TIA), and cerebral infarction without residual deficits: Secondary | ICD-10-CM

## 2023-02-13 DIAGNOSIS — R600 Localized edema: Secondary | ICD-10-CM | POA: Insufficient documentation

## 2023-02-13 DIAGNOSIS — N390 Urinary tract infection, site not specified: Secondary | ICD-10-CM

## 2023-02-13 DIAGNOSIS — I69354 Hemiplegia and hemiparesis following cerebral infarction affecting left non-dominant side: Secondary | ICD-10-CM

## 2023-02-13 DIAGNOSIS — I10 Essential (primary) hypertension: Secondary | ICD-10-CM | POA: Diagnosis not present

## 2023-02-13 DIAGNOSIS — I63511 Cerebral infarction due to unspecified occlusion or stenosis of right middle cerebral artery: Secondary | ICD-10-CM

## 2023-02-13 LAB — POC URINALSYSI DIPSTICK (AUTOMATED)
Bilirubin, UA: NEGATIVE
Glucose, UA: NEGATIVE
Ketones, UA: NEGATIVE
Protein, UA: POSITIVE — AB
Spec Grav, UA: 1.025 (ref 1.010–1.025)
Urobilinogen, UA: 1 U/dL
pH, UA: 5 (ref 5.0–8.0)

## 2023-02-13 MED ORDER — CIPROFLOXACIN HCL 500 MG PO TABS: 500.0000 mg | ORAL_TABLET | Freq: Two times a day (BID) | ORAL | 0 refills | Status: DC

## 2023-02-13 NOTE — Addendum Note (Signed)
Addended by: Carola Rhine on: 02/13/2023 11:59 AM   Modules accepted: Orders

## 2023-02-13 NOTE — Progress Notes (Signed)
   Subjective:    Patient ID: Mary Mercado, female    DOB: [birth year], 82 y.o.   MRN: [id number]  HPI Here with her husband for a transitional care visit to follow up a hospital stay from 01-24-23 to 01-30-23 for a right MCA stroke. She presented with acute left sided weakness and slurred speech. This was after she was diagnosed with a Covid infection on 01-18-23, and she was being treated with Paxlovid. She was also taking Pradaxa for atril fibrillation but only once a day. A CTA revealed a large vessel occlusion of the right M1 segment. She had emergent thrombectomy per Interventional Radiology. ECHO showed EF to be 60-65% with no wall motion abnormalities. The Paxlovid was stopped. She had bilateral leg edema so Amlodipine was stopped and she was started on Metoprolol tartrate. Her renal function was stable with a creatinine of 0.48 and a GFR of >60. She then stayed in a rehab unit from 01-30-23 to 02-07-23 to get PT, OT, and ST 5 days a week. She was sent home on BID Pradaxa. She is now getting outpatient PT, OT, and ST. She says her speech is coming back fairly well, and her left side is getting stronger. Her only complaint today is several days of urinary urgency and left flank pain. No fever. We noted that she had an indwelling catheter for awhile in the hospital. She drinks plenty of water.    Review of Systems  Constitutional: Negative.   Respiratory: Negative.    Cardiovascular: Negative.   Gastrointestinal: Negative.   Genitourinary:  Positive for flank pain, frequency and urgency. Negative for difficulty urinating, dysuria and hematuria.  Neurological:  Positive for speech difficulty and weakness.       Objective:   Physical Exam Constitutional:      Appearance: She is not ill-appearing.     Comments: Walks with a walker   Cardiovascular:     Rate and Rhythm: Normal rate. Rhythm irregular.     Pulses: Normal pulses.     Heart sounds: Normal heart sounds.  Pulmonary:      Effort: Pulmonary effort is normal.     Breath sounds: Normal breath sounds.  Abdominal:     Tenderness: There is no right CVA tenderness or left CVA tenderness.  Musculoskeletal:     Comments: 1+ edema in both lower legs   Neurological:     Mental Status: She is alert and oriented to person, place, and time.           Assessment & Plan:  She is recovering nicely from a recent right MCA stroke. She will remain on BID Pradaxa. She will follow up with Dr. Elijah Birk for rehab management on 02-20-23, and she will follow up with Dr. Pearlean Brownie for a neurological visit on 05-15-23. She has a UTI, and we will treat this with 7 days of Cipro. We did not have enough urine to send of for a culture however. Her HTN is a little undercontrolled, so we will add back Losartan 50 mg to take once daily. We spent a total of (35   ) minutes reviewing records and discussing these issues.  Gershon Crane, MD

## 2023-02-14 DIAGNOSIS — R2681 Unsteadiness on feet: Secondary | ICD-10-CM | POA: Diagnosis not present

## 2023-02-14 DIAGNOSIS — I69320 Aphasia following cerebral infarction: Secondary | ICD-10-CM | POA: Diagnosis not present

## 2023-02-14 DIAGNOSIS — R29898 Other symptoms and signs involving the musculoskeletal system: Secondary | ICD-10-CM | POA: Diagnosis not present

## 2023-02-14 DIAGNOSIS — R278 Other lack of coordination: Secondary | ICD-10-CM | POA: Diagnosis not present

## 2023-02-14 DIAGNOSIS — M6281 Muscle weakness (generalized): Secondary | ICD-10-CM | POA: Diagnosis not present

## 2023-02-14 DIAGNOSIS — I69391 Dysphagia following cerebral infarction: Secondary | ICD-10-CM | POA: Diagnosis not present

## 2023-02-15 DIAGNOSIS — I69391 Dysphagia following cerebral infarction: Secondary | ICD-10-CM | POA: Diagnosis not present

## 2023-02-15 DIAGNOSIS — M6281 Muscle weakness (generalized): Secondary | ICD-10-CM | POA: Diagnosis not present

## 2023-02-15 DIAGNOSIS — R2681 Unsteadiness on feet: Secondary | ICD-10-CM | POA: Diagnosis not present

## 2023-02-15 DIAGNOSIS — R29898 Other symptoms and signs involving the musculoskeletal system: Secondary | ICD-10-CM | POA: Diagnosis not present

## 2023-02-15 DIAGNOSIS — R278 Other lack of coordination: Secondary | ICD-10-CM | POA: Diagnosis not present

## 2023-02-15 DIAGNOSIS — I69320 Aphasia following cerebral infarction: Secondary | ICD-10-CM | POA: Diagnosis not present

## 2023-02-17 DIAGNOSIS — I69391 Dysphagia following cerebral infarction: Secondary | ICD-10-CM | POA: Diagnosis not present

## 2023-02-17 DIAGNOSIS — M6281 Muscle weakness (generalized): Secondary | ICD-10-CM | POA: Diagnosis not present

## 2023-02-17 DIAGNOSIS — I69320 Aphasia following cerebral infarction: Secondary | ICD-10-CM | POA: Diagnosis not present

## 2023-02-17 DIAGNOSIS — R278 Other lack of coordination: Secondary | ICD-10-CM | POA: Diagnosis not present

## 2023-02-17 DIAGNOSIS — R29898 Other symptoms and signs involving the musculoskeletal system: Secondary | ICD-10-CM | POA: Diagnosis not present

## 2023-02-17 DIAGNOSIS — R2681 Unsteadiness on feet: Secondary | ICD-10-CM | POA: Diagnosis not present

## 2023-02-17 NOTE — Progress Notes (Unsigned)
Subjective:    Patient ID: Mary Mercado, female    DOB: Apr 28, 1940, 82 y.o.   MRN: [id number]  HPI   Mary Mercado is a 82 y.o. year old female  who  has a past medical history of Allergy, CVA (cerebral vascular accident) (HCC) (05/2018, 01/09/20), Hyperlipidemia LDL goal <70, Hypertension, Pacemaker, Palpitations, and Vertigo, benign positional.   They are presenting to PM&R clinic for follow up related to IPR admission for R MCA CVA .   Interval Hx:  - Therapies: Going to PT, OT; "it's pretty strenuous and I have to tell them to go slower"   - Follow ups: PCP, going to see neurology January 20th. Getting her  her pacemaker checked soon.   - Falls: none  - DME: Using rollator for community ambulation; also in the home except will furniture walk in the kitchen. She lives in independent living and she has assistance for cooking / meals. She otherwise is independent of ADLs.    - Medications: PCP added back Losartan 50 mg 10/20;   - Other concerns: Patient states she had a UTI shortly after leaving the hospital, was able to get in with her PCP. She finished the antibiotic today and says it made her dizzy but did help her urinary symptoms.   Pain Inventory Average Pain 0 Pain Right Now 0 My pain is burning  LOCATION OF PAIN  burning on left chest & left side  BOWEL Number of stools per week: 7-10 Oral laxative use No  Type of laxative none  BLADDER Normal and Pads    Mobility walk with assistance use a walker ability to climb steps?  no do you drive?  no  Function retired I need assistance with the following:  household duties  Neuro/Psych trouble walking anxiety  Prior Studies Any changes since last visit?  no  Physicians involved in your care Any changes since last visit?  no   Family History  Problem Relation Age of Onset   Diabetes Mother    Heart attack Mother        74   Colon cancer Father    Heart attack Brother 52    Sleep apnea Neg Hx    Social History   Socioeconomic History   Marital status: Married    Spouse name: Mary Mercado   Number of children: Not on file   Years of education: 12+   Highest education level: Not on file  Occupational History    Comment: retired  Tobacco Use   Smoking status: Never   Smokeless tobacco: Never  Vaping Use   Vaping status: Never Used  Substance and Sexual Activity   Alcohol use: Never   Drug use: Never   Sexual activity: Not on file  Other Topics Concern   Not on file  Social History Narrative   Lives with husband   caffeine none daily   Right Handed   Social Determinants of Health   Financial Resource Strain: Low Risk  (05/27/2022)   Overall Financial Resource Strain (CARDIA)    Difficulty of Paying Living Expenses: Not hard at all  Food Insecurity: No Food Insecurity (01/24/2023)   Hunger Vital Sign    Worried About Running Out of Food in the Last Year: Never true    Ran Out of Food in the Last Year: Never true  Transportation Needs: No Transportation Needs (01/24/2023)   PRAPARE - Administrator, Civil Service (Medical): No    Lack of Transportation (  Non-Medical): No  Physical Activity: Insufficiently Active (05/27/2022)   Exercise Vital Sign    Days of Exercise per Week: 2 days    Minutes of Exercise per Session: 50 min  Stress: No Stress Concern Present (05/27/2022)   [employer] of Occupational Health - Occupational Stress Questionnaire    Feeling of Stress : Not at all  Social Connections: Socially Integrated (05/27/2022)   Social Connection and Isolation Panel [NHANES]    Frequency of Communication with Friends and Family: More than three times a week    Frequency of Social Gatherings with Friends and Family: More than three times a week    Attends Religious Services: More than 4 times per year    Active Member of Clubs or Organizations: Yes    Attends Banker Meetings: More than 4 times per year    Marital Status:  Married   Past Surgical History:  Procedure Laterality Date   AV NODE ABLATION N/A 03/30/2020   Procedure: AV NODE ABLATION;  Surgeon: Duke Salvia, MD;  Location: [facility] INVASIVE CV LAB;  Service: Cardiovascular;  Laterality: N/A;   AV NODE ABLATION N/A 03/30/2020   Procedure: AV NODE ABLATION;  Surgeon: Duke Salvia, MD;  Location: [facility] INVASIVE CV LAB;  Service: Cardiovascular;  Laterality: N/A;   CARDIOVERSION Left 10/31/2018   Procedure: CARDIOVERSION;  Surgeon: Elease Hashimoto Deloris Ping, MD;  Location: [facility] ENDOSCOPY;  Service: Cardiovascular;  Laterality: Left;   CARDIOVERSION N/A 01/08/2020   Procedure: CARDIOVERSION;  Surgeon: Parke Poisson, MD;  Location: [facility] ENDOSCOPY;  Service: Cardiovascular;  Laterality: N/A;   Hemilaminectomy and microdiskectomy at L4-5 on the left.  12/10/2003   IR CT HEAD LTD  01/24/2023   IR PERCUTANEOUS ART THROMBECTOMY/INFUSION INTRACRANIAL INC DIAG ANGIO  01/24/2023   IR US GUIDE VASC ACCESS RIGHT  01/24/2023   PACEMAKER IMPLANT N/A 03/30/2020   Procedure: PACEMAKER IMPLANT;  Surgeon: Duke Salvia, MD;  Location: [facility] INVASIVE CV LAB;  Service: Cardiovascular;  Laterality: N/A;   RADIOLOGY WITH ANESTHESIA N/A 01/24/2023   Procedure: IR WITH ANESTHESIA;  Surgeon: Radiologist, Medication, MD;  Location: [facility] OR;  Service: Radiology;  Laterality: N/A;   TEE WITHOUT CARDIOVERSION N/A 06/05/2018   Procedure: TRANSESOPHAGEAL ECHOCARDIOGRAM (TEE);  Surgeon: Lewayne Bunting, MD;  Location: [facility] ENDOSCOPY;  Service: Cardiovascular;  Laterality: N/A;  loop   Past Medical History:  Diagnosis Date   Allergy    CVA (cerebral vascular accident) (HCC) 05/2018, 01/09/20   Hyperlipidemia LDL goal <70    Hypertension    Pacemaker    Palpitations    Vertigo, benign positional    There were no vitals taken for this visit.  Opioid Risk Score:   Fall Risk Score:  `1  Depression screen PHQ 2/9     02/13/2023   12:00 PM 05/27/2022    2:42 PM 03/07/2022    8:39 AM 01/13/2022    9:23  AM 05/26/2021    2:15 PM 03/04/2021    9:05 AM 10/29/2020   10:54 AM  Depression screen PHQ 2/9  Decreased Interest 1 0 1 0 0 0 1  Down, Depressed, Hopeless 0 0 0 0 0 0 1  PHQ - 2 Score 1 0 1 0 0 0 2  Altered sleeping 0  0 0  0   Tired, decreased energy 1  0 0  0   Change in appetite 0  0 0  0   Feeling bad or failure about yourself  1  0  0  0   Trouble concentrating 1  0 0  0   Moving slowly or fidgety/restless 0  0 0  0   Suicidal thoughts 0  0 0  0   PHQ-9 Score 4  1 0  0   Difficult doing work/chores Somewhat difficult  Not difficult at all Not difficult at all  Not difficult at all     Review of Systems  Musculoskeletal:  Positive for gait problem.  All other systems reviewed and are negative.      Objective:   Physical Exam   PE: Constitution: Appropriate appearance for age. No apparent distress. Resp: No respiratory distress. No accessory muscle usage. on RA Cardio: Well perfused appearance. + 2 RLE peripheral edema, nontender, pitting.  Abdomen: Nondistended. Nontender.   Psych: Appropriate mood and affect. Neuro: AAOx4. Mild baseline cognitive deficits . +HOH  Neurologic Exam:   DTRs: Reflexes were 2+ in bilateral achilles, patella, biceps, BR and triceps. Babinsky: flexor responses b/l.   Hoffmans: negative b/l Sensory exam: revealed normal sensation in all dermatomal regions in bilateral upper extremities and bilateral lower extremities Motor exam: strength 5/5 throughout bilateral upper extremities, left lower extremity, and with exception of RLE HF 4/5, KE 4/5, DF AND PF 5/5 Coordination: Fine motor coordination was normal.       Assessment & Plan:   Mary Mercado is a 82 y.o. year old female  who  has a past medical history of Allergy, CVA (cerebral vascular accident) (HCC) (05/2018, 01/09/20), Hyperlipidemia LDL goal <70, Hypertension, Pacemaker, Palpitations, and Vertigo, benign positional.   They are presenting to PM&R clinic for follow up related  to IPR admission for R MCA CVA .  Cerebrovascular accident (CVA) due to embolism of right middle cerebral artery [facility]) Patient doing very well from a functional standpoint; continue therapies  OT not covered by her insurance; I will message SW regarding how to get this covered  Follow up PRN Rib pain on left side Prescribed lidocaine patch Q12H PRN for local L rib neuropathic pain  Essential hypertension Patient with SBP 160-170s in clinic today; attributed to white coat syndrome  Recently added lisinopril with her PCP; BP log otherwise looks good  Follow up with PCP for BP management  Use TED hose for R leg edema, which has been ongoing, no concerning s/s DVT and had negative duplex inpatient for this  Anxiety state Discuss with Dr. Pearlean Brownie in January if switching off of Keppra could help with these symptoms, as they are new from her stoke  Denying sleep issues, memory changes, or depression so no need to add medication at this point

## 2023-02-20 ENCOUNTER — Encounter: Payer: Self-pay | Admitting: Physical Medicine and Rehabilitation

## 2023-02-20 ENCOUNTER — Telehealth: Payer: Self-pay

## 2023-02-20 ENCOUNTER — Encounter
Payer: Medicare Other | Attending: Physical Medicine and Rehabilitation | Admitting: Physical Medicine and Rehabilitation

## 2023-02-20 VITALS — BP 162/103 | HR 74 | Ht 65.0 in | Wt 104.0 lb

## 2023-02-20 DIAGNOSIS — F411 Generalized anxiety disorder: Secondary | ICD-10-CM | POA: Insufficient documentation

## 2023-02-20 DIAGNOSIS — R0781 Pleurodynia: Secondary | ICD-10-CM | POA: Diagnosis not present

## 2023-02-20 DIAGNOSIS — I63411 Cerebral infarction due to embolism of right middle cerebral artery: Secondary | ICD-10-CM | POA: Diagnosis not present

## 2023-02-20 DIAGNOSIS — I10 Essential (primary) hypertension: Secondary | ICD-10-CM | POA: Insufficient documentation

## 2023-02-20 MED ORDER — LIDOCAINE 5 % EX PTCH: 1.0000 | MEDICATED_PATCH | Freq: Two times a day (BID) | CUTANEOUS | 3 refills | Status: DC | PRN

## 2023-02-20 NOTE — Telephone Encounter (Signed)
Mary Mercado (Key: [id number]) Pa submitted for lidocaine patches

## 2023-02-20 NOTE — Patient Instructions (Signed)
  Cerebrovascular accident (CVA) due to embolism of right middle cerebral artery [facility]) Patient doing very well from a functional standpoint; continue therapies   OT not covered by her insurance; I will message SW regarding how to get this covered   Follow up PRN Rib pain on left side Prescribed lidocaine patch Q12H PRN for local L rib neuropathic pain   Essential hypertension Patient with SBP 160-170s in clinic today; attributed to white coat syndrome   Recently added lisinopril with her PCP; BP log otherwise looks good   Follow up with PCP for BP management   Use TED hose for R leg edema, which has been ongoing, no concerning s/s DVT and had negative duplex inpatient for this   Anxiety state Discuss with Dr. Pearlean Brownie in January if switching off of Keppra could help with these symptoms, as they are new from her stoke   Denying sleep issues, memory changes, or depression so no need to add medication at this point

## 2023-02-21 DIAGNOSIS — I69320 Aphasia following cerebral infarction: Secondary | ICD-10-CM | POA: Diagnosis not present

## 2023-02-21 DIAGNOSIS — R278 Other lack of coordination: Secondary | ICD-10-CM | POA: Diagnosis not present

## 2023-02-21 DIAGNOSIS — I69391 Dysphagia following cerebral infarction: Secondary | ICD-10-CM | POA: Diagnosis not present

## 2023-02-21 DIAGNOSIS — M6281 Muscle weakness (generalized): Secondary | ICD-10-CM | POA: Diagnosis not present

## 2023-02-21 DIAGNOSIS — R29898 Other symptoms and signs involving the musculoskeletal system: Secondary | ICD-10-CM | POA: Diagnosis not present

## 2023-02-21 DIAGNOSIS — R2681 Unsteadiness on feet: Secondary | ICD-10-CM | POA: Diagnosis not present

## 2023-02-22 DIAGNOSIS — R278 Other lack of coordination: Secondary | ICD-10-CM | POA: Diagnosis not present

## 2023-02-22 DIAGNOSIS — I69320 Aphasia following cerebral infarction: Secondary | ICD-10-CM | POA: Diagnosis not present

## 2023-02-22 DIAGNOSIS — I69391 Dysphagia following cerebral infarction: Secondary | ICD-10-CM | POA: Diagnosis not present

## 2023-02-22 DIAGNOSIS — R29898 Other symptoms and signs involving the musculoskeletal system: Secondary | ICD-10-CM | POA: Diagnosis not present

## 2023-02-22 DIAGNOSIS — R2681 Unsteadiness on feet: Secondary | ICD-10-CM | POA: Diagnosis not present

## 2023-02-22 DIAGNOSIS — M6281 Muscle weakness (generalized): Secondary | ICD-10-CM | POA: Diagnosis not present

## 2023-02-24 DIAGNOSIS — I69320 Aphasia following cerebral infarction: Secondary | ICD-10-CM | POA: Diagnosis not present

## 2023-02-24 DIAGNOSIS — R29898 Other symptoms and signs involving the musculoskeletal system: Secondary | ICD-10-CM | POA: Diagnosis not present

## 2023-02-24 DIAGNOSIS — R278 Other lack of coordination: Secondary | ICD-10-CM | POA: Diagnosis not present

## 2023-02-24 DIAGNOSIS — R2681 Unsteadiness on feet: Secondary | ICD-10-CM | POA: Diagnosis not present

## 2023-02-24 DIAGNOSIS — I69391 Dysphagia following cerebral infarction: Secondary | ICD-10-CM | POA: Diagnosis not present

## 2023-02-24 DIAGNOSIS — M6281 Muscle weakness (generalized): Secondary | ICD-10-CM | POA: Diagnosis not present

## 2023-02-27 DIAGNOSIS — R278 Other lack of coordination: Secondary | ICD-10-CM | POA: Diagnosis not present

## 2023-02-27 DIAGNOSIS — M6281 Muscle weakness (generalized): Secondary | ICD-10-CM | POA: Diagnosis not present

## 2023-02-27 DIAGNOSIS — R29898 Other symptoms and signs involving the musculoskeletal system: Secondary | ICD-10-CM | POA: Diagnosis not present

## 2023-02-27 DIAGNOSIS — I69320 Aphasia following cerebral infarction: Secondary | ICD-10-CM | POA: Diagnosis not present

## 2023-02-27 DIAGNOSIS — R2681 Unsteadiness on feet: Secondary | ICD-10-CM | POA: Diagnosis not present

## 2023-02-27 DIAGNOSIS — I69391 Dysphagia following cerebral infarction: Secondary | ICD-10-CM | POA: Diagnosis not present

## 2023-02-28 DIAGNOSIS — I69391 Dysphagia following cerebral infarction: Secondary | ICD-10-CM | POA: Diagnosis not present

## 2023-02-28 DIAGNOSIS — R278 Other lack of coordination: Secondary | ICD-10-CM | POA: Diagnosis not present

## 2023-02-28 DIAGNOSIS — M6281 Muscle weakness (generalized): Secondary | ICD-10-CM | POA: Diagnosis not present

## 2023-02-28 DIAGNOSIS — I69320 Aphasia following cerebral infarction: Secondary | ICD-10-CM | POA: Diagnosis not present

## 2023-02-28 DIAGNOSIS — R2681 Unsteadiness on feet: Secondary | ICD-10-CM | POA: Diagnosis not present

## 2023-02-28 DIAGNOSIS — R29898 Other symptoms and signs involving the musculoskeletal system: Secondary | ICD-10-CM | POA: Diagnosis not present

## 2023-03-01 DIAGNOSIS — R2681 Unsteadiness on feet: Secondary | ICD-10-CM | POA: Diagnosis not present

## 2023-03-01 DIAGNOSIS — R29898 Other symptoms and signs involving the musculoskeletal system: Secondary | ICD-10-CM | POA: Diagnosis not present

## 2023-03-01 DIAGNOSIS — I69320 Aphasia following cerebral infarction: Secondary | ICD-10-CM | POA: Diagnosis not present

## 2023-03-01 DIAGNOSIS — M6281 Muscle weakness (generalized): Secondary | ICD-10-CM | POA: Diagnosis not present

## 2023-03-01 DIAGNOSIS — R278 Other lack of coordination: Secondary | ICD-10-CM | POA: Diagnosis not present

## 2023-03-01 DIAGNOSIS — I69391 Dysphagia following cerebral infarction: Secondary | ICD-10-CM | POA: Diagnosis not present

## 2023-03-02 DIAGNOSIS — I69320 Aphasia following cerebral infarction: Secondary | ICD-10-CM | POA: Diagnosis not present

## 2023-03-02 DIAGNOSIS — M6281 Muscle weakness (generalized): Secondary | ICD-10-CM | POA: Diagnosis not present

## 2023-03-02 DIAGNOSIS — R29898 Other symptoms and signs involving the musculoskeletal system: Secondary | ICD-10-CM | POA: Diagnosis not present

## 2023-03-02 DIAGNOSIS — I69391 Dysphagia following cerebral infarction: Secondary | ICD-10-CM | POA: Diagnosis not present

## 2023-03-02 DIAGNOSIS — R278 Other lack of coordination: Secondary | ICD-10-CM | POA: Diagnosis not present

## 2023-03-02 DIAGNOSIS — R2681 Unsteadiness on feet: Secondary | ICD-10-CM | POA: Diagnosis not present

## 2023-03-03 DIAGNOSIS — M6281 Muscle weakness (generalized): Secondary | ICD-10-CM | POA: Diagnosis not present

## 2023-03-03 DIAGNOSIS — R2681 Unsteadiness on feet: Secondary | ICD-10-CM | POA: Diagnosis not present

## 2023-03-03 DIAGNOSIS — I69391 Dysphagia following cerebral infarction: Secondary | ICD-10-CM | POA: Diagnosis not present

## 2023-03-03 DIAGNOSIS — R278 Other lack of coordination: Secondary | ICD-10-CM | POA: Diagnosis not present

## 2023-03-03 DIAGNOSIS — I69320 Aphasia following cerebral infarction: Secondary | ICD-10-CM | POA: Diagnosis not present

## 2023-03-03 DIAGNOSIS — R29898 Other symptoms and signs involving the musculoskeletal system: Secondary | ICD-10-CM | POA: Diagnosis not present

## 2023-03-06 DIAGNOSIS — R2681 Unsteadiness on feet: Secondary | ICD-10-CM | POA: Diagnosis not present

## 2023-03-06 DIAGNOSIS — I69320 Aphasia following cerebral infarction: Secondary | ICD-10-CM | POA: Diagnosis not present

## 2023-03-06 DIAGNOSIS — I69391 Dysphagia following cerebral infarction: Secondary | ICD-10-CM | POA: Diagnosis not present

## 2023-03-06 DIAGNOSIS — R29898 Other symptoms and signs involving the musculoskeletal system: Secondary | ICD-10-CM | POA: Diagnosis not present

## 2023-03-06 DIAGNOSIS — M6281 Muscle weakness (generalized): Secondary | ICD-10-CM | POA: Diagnosis not present

## 2023-03-06 DIAGNOSIS — R278 Other lack of coordination: Secondary | ICD-10-CM | POA: Diagnosis not present

## 2023-03-07 DIAGNOSIS — I69320 Aphasia following cerebral infarction: Secondary | ICD-10-CM | POA: Diagnosis not present

## 2023-03-07 DIAGNOSIS — R278 Other lack of coordination: Secondary | ICD-10-CM | POA: Diagnosis not present

## 2023-03-07 DIAGNOSIS — R2681 Unsteadiness on feet: Secondary | ICD-10-CM | POA: Diagnosis not present

## 2023-03-07 DIAGNOSIS — R29898 Other symptoms and signs involving the musculoskeletal system: Secondary | ICD-10-CM | POA: Diagnosis not present

## 2023-03-07 DIAGNOSIS — D0462 Carcinoma in situ of skin of left upper limb, including shoulder: Secondary | ICD-10-CM | POA: Diagnosis not present

## 2023-03-07 DIAGNOSIS — I69391 Dysphagia following cerebral infarction: Secondary | ICD-10-CM | POA: Diagnosis not present

## 2023-03-07 DIAGNOSIS — M6281 Muscle weakness (generalized): Secondary | ICD-10-CM | POA: Diagnosis not present

## 2023-03-07 DIAGNOSIS — L570 Actinic keratosis: Secondary | ICD-10-CM | POA: Diagnosis not present

## 2023-03-08 DIAGNOSIS — R29898 Other symptoms and signs involving the musculoskeletal system: Secondary | ICD-10-CM | POA: Diagnosis not present

## 2023-03-08 DIAGNOSIS — M6281 Muscle weakness (generalized): Secondary | ICD-10-CM | POA: Diagnosis not present

## 2023-03-08 DIAGNOSIS — I69320 Aphasia following cerebral infarction: Secondary | ICD-10-CM | POA: Diagnosis not present

## 2023-03-08 DIAGNOSIS — I69391 Dysphagia following cerebral infarction: Secondary | ICD-10-CM | POA: Diagnosis not present

## 2023-03-08 DIAGNOSIS — R2681 Unsteadiness on feet: Secondary | ICD-10-CM | POA: Diagnosis not present

## 2023-03-08 DIAGNOSIS — R278 Other lack of coordination: Secondary | ICD-10-CM | POA: Diagnosis not present

## 2023-03-09 DIAGNOSIS — M6281 Muscle weakness (generalized): Secondary | ICD-10-CM | POA: Diagnosis not present

## 2023-03-09 DIAGNOSIS — I69391 Dysphagia following cerebral infarction: Secondary | ICD-10-CM | POA: Diagnosis not present

## 2023-03-09 DIAGNOSIS — I69320 Aphasia following cerebral infarction: Secondary | ICD-10-CM | POA: Diagnosis not present

## 2023-03-09 DIAGNOSIS — R278 Other lack of coordination: Secondary | ICD-10-CM | POA: Diagnosis not present

## 2023-03-09 DIAGNOSIS — R29898 Other symptoms and signs involving the musculoskeletal system: Secondary | ICD-10-CM | POA: Diagnosis not present

## 2023-03-09 DIAGNOSIS — R2681 Unsteadiness on feet: Secondary | ICD-10-CM | POA: Diagnosis not present

## 2023-03-10 DIAGNOSIS — I69320 Aphasia following cerebral infarction: Secondary | ICD-10-CM | POA: Diagnosis not present

## 2023-03-10 DIAGNOSIS — R29898 Other symptoms and signs involving the musculoskeletal system: Secondary | ICD-10-CM | POA: Diagnosis not present

## 2023-03-10 DIAGNOSIS — R2681 Unsteadiness on feet: Secondary | ICD-10-CM | POA: Diagnosis not present

## 2023-03-10 DIAGNOSIS — M6281 Muscle weakness (generalized): Secondary | ICD-10-CM | POA: Diagnosis not present

## 2023-03-10 DIAGNOSIS — R278 Other lack of coordination: Secondary | ICD-10-CM | POA: Diagnosis not present

## 2023-03-10 DIAGNOSIS — I69391 Dysphagia following cerebral infarction: Secondary | ICD-10-CM | POA: Diagnosis not present

## 2023-03-13 DIAGNOSIS — M6281 Muscle weakness (generalized): Secondary | ICD-10-CM | POA: Diagnosis not present

## 2023-03-13 DIAGNOSIS — H401134 Primary open-angle glaucoma, bilateral, indeterminate stage: Secondary | ICD-10-CM | POA: Diagnosis not present

## 2023-03-13 DIAGNOSIS — R29898 Other symptoms and signs involving the musculoskeletal system: Secondary | ICD-10-CM | POA: Diagnosis not present

## 2023-03-13 DIAGNOSIS — R2681 Unsteadiness on feet: Secondary | ICD-10-CM | POA: Diagnosis not present

## 2023-03-13 DIAGNOSIS — R278 Other lack of coordination: Secondary | ICD-10-CM | POA: Diagnosis not present

## 2023-03-13 DIAGNOSIS — I69320 Aphasia following cerebral infarction: Secondary | ICD-10-CM | POA: Diagnosis not present

## 2023-03-13 DIAGNOSIS — I69391 Dysphagia following cerebral infarction: Secondary | ICD-10-CM | POA: Diagnosis not present

## 2023-03-14 DIAGNOSIS — R29898 Other symptoms and signs involving the musculoskeletal system: Secondary | ICD-10-CM | POA: Diagnosis not present

## 2023-03-14 DIAGNOSIS — I69391 Dysphagia following cerebral infarction: Secondary | ICD-10-CM | POA: Diagnosis not present

## 2023-03-14 DIAGNOSIS — R278 Other lack of coordination: Secondary | ICD-10-CM | POA: Diagnosis not present

## 2023-03-14 DIAGNOSIS — M6281 Muscle weakness (generalized): Secondary | ICD-10-CM | POA: Diagnosis not present

## 2023-03-14 DIAGNOSIS — I69320 Aphasia following cerebral infarction: Secondary | ICD-10-CM | POA: Diagnosis not present

## 2023-03-14 DIAGNOSIS — R2681 Unsteadiness on feet: Secondary | ICD-10-CM | POA: Diagnosis not present

## 2023-03-15 DIAGNOSIS — R278 Other lack of coordination: Secondary | ICD-10-CM | POA: Diagnosis not present

## 2023-03-15 DIAGNOSIS — I69391 Dysphagia following cerebral infarction: Secondary | ICD-10-CM | POA: Diagnosis not present

## 2023-03-15 DIAGNOSIS — I69320 Aphasia following cerebral infarction: Secondary | ICD-10-CM | POA: Diagnosis not present

## 2023-03-15 DIAGNOSIS — M6281 Muscle weakness (generalized): Secondary | ICD-10-CM | POA: Diagnosis not present

## 2023-03-15 DIAGNOSIS — R29898 Other symptoms and signs involving the musculoskeletal system: Secondary | ICD-10-CM | POA: Diagnosis not present

## 2023-03-15 DIAGNOSIS — R2681 Unsteadiness on feet: Secondary | ICD-10-CM | POA: Diagnosis not present

## 2023-03-16 DIAGNOSIS — I69391 Dysphagia following cerebral infarction: Secondary | ICD-10-CM | POA: Diagnosis not present

## 2023-03-16 DIAGNOSIS — R2681 Unsteadiness on feet: Secondary | ICD-10-CM | POA: Diagnosis not present

## 2023-03-16 DIAGNOSIS — R29898 Other symptoms and signs involving the musculoskeletal system: Secondary | ICD-10-CM | POA: Diagnosis not present

## 2023-03-16 DIAGNOSIS — I69320 Aphasia following cerebral infarction: Secondary | ICD-10-CM | POA: Diagnosis not present

## 2023-03-16 DIAGNOSIS — R278 Other lack of coordination: Secondary | ICD-10-CM | POA: Diagnosis not present

## 2023-03-16 DIAGNOSIS — M6281 Muscle weakness (generalized): Secondary | ICD-10-CM | POA: Diagnosis not present

## 2023-03-17 DIAGNOSIS — I69320 Aphasia following cerebral infarction: Secondary | ICD-10-CM | POA: Diagnosis not present

## 2023-03-17 DIAGNOSIS — R29898 Other symptoms and signs involving the musculoskeletal system: Secondary | ICD-10-CM | POA: Diagnosis not present

## 2023-03-17 DIAGNOSIS — M6281 Muscle weakness (generalized): Secondary | ICD-10-CM | POA: Diagnosis not present

## 2023-03-17 DIAGNOSIS — R278 Other lack of coordination: Secondary | ICD-10-CM | POA: Diagnosis not present

## 2023-03-17 DIAGNOSIS — I69391 Dysphagia following cerebral infarction: Secondary | ICD-10-CM | POA: Diagnosis not present

## 2023-03-17 DIAGNOSIS — R2681 Unsteadiness on feet: Secondary | ICD-10-CM | POA: Diagnosis not present

## 2023-03-20 DIAGNOSIS — R2681 Unsteadiness on feet: Secondary | ICD-10-CM | POA: Diagnosis not present

## 2023-03-20 DIAGNOSIS — R29898 Other symptoms and signs involving the musculoskeletal system: Secondary | ICD-10-CM | POA: Diagnosis not present

## 2023-03-20 DIAGNOSIS — R278 Other lack of coordination: Secondary | ICD-10-CM | POA: Diagnosis not present

## 2023-03-20 DIAGNOSIS — I69320 Aphasia following cerebral infarction: Secondary | ICD-10-CM | POA: Diagnosis not present

## 2023-03-20 DIAGNOSIS — M6281 Muscle weakness (generalized): Secondary | ICD-10-CM | POA: Diagnosis not present

## 2023-03-20 DIAGNOSIS — I69391 Dysphagia following cerebral infarction: Secondary | ICD-10-CM | POA: Diagnosis not present

## 2023-03-27 DIAGNOSIS — M6281 Muscle weakness (generalized): Secondary | ICD-10-CM | POA: Diagnosis not present

## 2023-03-27 DIAGNOSIS — R29898 Other symptoms and signs involving the musculoskeletal system: Secondary | ICD-10-CM | POA: Diagnosis not present

## 2023-03-27 DIAGNOSIS — I69391 Dysphagia following cerebral infarction: Secondary | ICD-10-CM | POA: Diagnosis not present

## 2023-03-27 DIAGNOSIS — R2681 Unsteadiness on feet: Secondary | ICD-10-CM | POA: Diagnosis not present

## 2023-03-27 DIAGNOSIS — I69320 Aphasia following cerebral infarction: Secondary | ICD-10-CM | POA: Diagnosis not present

## 2023-03-27 DIAGNOSIS — R278 Other lack of coordination: Secondary | ICD-10-CM | POA: Diagnosis not present

## 2023-03-29 DIAGNOSIS — R29898 Other symptoms and signs involving the musculoskeletal system: Secondary | ICD-10-CM | POA: Diagnosis not present

## 2023-03-29 DIAGNOSIS — R2681 Unsteadiness on feet: Secondary | ICD-10-CM | POA: Diagnosis not present

## 2023-03-29 DIAGNOSIS — I69320 Aphasia following cerebral infarction: Secondary | ICD-10-CM | POA: Diagnosis not present

## 2023-03-29 DIAGNOSIS — R278 Other lack of coordination: Secondary | ICD-10-CM | POA: Diagnosis not present

## 2023-03-29 DIAGNOSIS — M6281 Muscle weakness (generalized): Secondary | ICD-10-CM | POA: Diagnosis not present

## 2023-03-29 DIAGNOSIS — I69391 Dysphagia following cerebral infarction: Secondary | ICD-10-CM | POA: Diagnosis not present

## 2023-03-30 ENCOUNTER — Ambulatory Visit (INDEPENDENT_AMBULATORY_CARE_PROVIDER_SITE_OTHER): Payer: Medicare Other

## 2023-03-30 DIAGNOSIS — I4430 Unspecified atrioventricular block: Secondary | ICD-10-CM | POA: Diagnosis not present

## 2023-03-30 LAB — CUP PACEART REMOTE DEVICE CHECK
Battery Remaining Longevity: 117 mo
Battery Voltage: 3 V
Brady Statistic AP VP Percent: 0 %
Brady Statistic AP VS Percent: 0 %
Brady Statistic AS VP Percent: 99.85 %
Brady Statistic AS VS Percent: 0.15 %
Brady Statistic RA Percent Paced: 0 %
Brady Statistic RV Percent Paced: 99.85 %
Date Time Interrogation Session: 20241204184813
Implantable Lead Connection Status: 753985
Implantable Lead Connection Status: 753985
Implantable Lead Implant Date: 20211206
Implantable Lead Implant Date: 20211206
Implantable Lead Location: 753858
Implantable Lead Location: 753860
Implantable Lead Model: 3830
Implantable Lead Model: 5076
Implantable Pulse Generator Implant Date: 20211206
Lead Channel Impedance Value: 323 Ohm
Lead Channel Impedance Value: 361 Ohm
Lead Channel Impedance Value: 399 Ohm
Lead Channel Impedance Value: 494 Ohm
Lead Channel Pacing Threshold Amplitude: 0.875 V
Lead Channel Pacing Threshold Pulse Width: 0.4 ms
Lead Channel Sensing Intrinsic Amplitude: 6.625 mV
Lead Channel Sensing Intrinsic Amplitude: 6.625 mV
Lead Channel Sensing Intrinsic Amplitude: 6.875 mV
Lead Channel Sensing Intrinsic Amplitude: 6.875 mV
Lead Channel Setting Pacing Amplitude: 2 V
Lead Channel Setting Pacing Pulse Width: 0.4 ms
Lead Channel Setting Sensing Sensitivity: 2 mV
Zone Setting Status: 755011

## 2023-03-31 DIAGNOSIS — R29898 Other symptoms and signs involving the musculoskeletal system: Secondary | ICD-10-CM | POA: Diagnosis not present

## 2023-03-31 DIAGNOSIS — I69320 Aphasia following cerebral infarction: Secondary | ICD-10-CM | POA: Diagnosis not present

## 2023-03-31 DIAGNOSIS — R278 Other lack of coordination: Secondary | ICD-10-CM | POA: Diagnosis not present

## 2023-03-31 DIAGNOSIS — M6281 Muscle weakness (generalized): Secondary | ICD-10-CM | POA: Diagnosis not present

## 2023-03-31 DIAGNOSIS — I69391 Dysphagia following cerebral infarction: Secondary | ICD-10-CM | POA: Diagnosis not present

## 2023-03-31 DIAGNOSIS — R2681 Unsteadiness on feet: Secondary | ICD-10-CM | POA: Diagnosis not present

## 2023-04-03 DIAGNOSIS — R278 Other lack of coordination: Secondary | ICD-10-CM | POA: Diagnosis not present

## 2023-04-03 DIAGNOSIS — M6281 Muscle weakness (generalized): Secondary | ICD-10-CM | POA: Diagnosis not present

## 2023-04-03 DIAGNOSIS — I69391 Dysphagia following cerebral infarction: Secondary | ICD-10-CM | POA: Diagnosis not present

## 2023-04-03 DIAGNOSIS — R2681 Unsteadiness on feet: Secondary | ICD-10-CM | POA: Diagnosis not present

## 2023-04-03 DIAGNOSIS — I69320 Aphasia following cerebral infarction: Secondary | ICD-10-CM | POA: Diagnosis not present

## 2023-04-03 DIAGNOSIS — R29898 Other symptoms and signs involving the musculoskeletal system: Secondary | ICD-10-CM | POA: Diagnosis not present

## 2023-04-04 DIAGNOSIS — R2681 Unsteadiness on feet: Secondary | ICD-10-CM | POA: Diagnosis not present

## 2023-04-04 DIAGNOSIS — I69391 Dysphagia following cerebral infarction: Secondary | ICD-10-CM | POA: Diagnosis not present

## 2023-04-04 DIAGNOSIS — I69320 Aphasia following cerebral infarction: Secondary | ICD-10-CM | POA: Diagnosis not present

## 2023-04-04 DIAGNOSIS — M6281 Muscle weakness (generalized): Secondary | ICD-10-CM | POA: Diagnosis not present

## 2023-04-04 DIAGNOSIS — R29898 Other symptoms and signs involving the musculoskeletal system: Secondary | ICD-10-CM | POA: Diagnosis not present

## 2023-04-04 DIAGNOSIS — R278 Other lack of coordination: Secondary | ICD-10-CM | POA: Diagnosis not present

## 2023-04-05 DIAGNOSIS — R278 Other lack of coordination: Secondary | ICD-10-CM | POA: Diagnosis not present

## 2023-04-05 DIAGNOSIS — R29898 Other symptoms and signs involving the musculoskeletal system: Secondary | ICD-10-CM | POA: Diagnosis not present

## 2023-04-05 DIAGNOSIS — R2681 Unsteadiness on feet: Secondary | ICD-10-CM | POA: Diagnosis not present

## 2023-04-05 DIAGNOSIS — M6281 Muscle weakness (generalized): Secondary | ICD-10-CM | POA: Diagnosis not present

## 2023-04-05 DIAGNOSIS — I69391 Dysphagia following cerebral infarction: Secondary | ICD-10-CM | POA: Diagnosis not present

## 2023-04-05 DIAGNOSIS — I69320 Aphasia following cerebral infarction: Secondary | ICD-10-CM | POA: Diagnosis not present

## 2023-04-10 ENCOUNTER — Other Ambulatory Visit: Payer: Self-pay | Admitting: Adult Health

## 2023-04-10 DIAGNOSIS — R278 Other lack of coordination: Secondary | ICD-10-CM | POA: Diagnosis not present

## 2023-04-10 DIAGNOSIS — I69391 Dysphagia following cerebral infarction: Secondary | ICD-10-CM | POA: Diagnosis not present

## 2023-04-10 DIAGNOSIS — I69320 Aphasia following cerebral infarction: Secondary | ICD-10-CM | POA: Diagnosis not present

## 2023-04-10 DIAGNOSIS — R2681 Unsteadiness on feet: Secondary | ICD-10-CM | POA: Diagnosis not present

## 2023-04-10 DIAGNOSIS — M6281 Muscle weakness (generalized): Secondary | ICD-10-CM | POA: Diagnosis not present

## 2023-04-10 DIAGNOSIS — R29898 Other symptoms and signs involving the musculoskeletal system: Secondary | ICD-10-CM | POA: Diagnosis not present

## 2023-04-11 DIAGNOSIS — M6281 Muscle weakness (generalized): Secondary | ICD-10-CM | POA: Diagnosis not present

## 2023-04-11 DIAGNOSIS — R2681 Unsteadiness on feet: Secondary | ICD-10-CM | POA: Diagnosis not present

## 2023-04-11 DIAGNOSIS — R29898 Other symptoms and signs involving the musculoskeletal system: Secondary | ICD-10-CM | POA: Diagnosis not present

## 2023-04-11 DIAGNOSIS — R278 Other lack of coordination: Secondary | ICD-10-CM | POA: Diagnosis not present

## 2023-04-11 DIAGNOSIS — I69391 Dysphagia following cerebral infarction: Secondary | ICD-10-CM | POA: Diagnosis not present

## 2023-04-11 DIAGNOSIS — I69320 Aphasia following cerebral infarction: Secondary | ICD-10-CM | POA: Diagnosis not present

## 2023-04-12 DIAGNOSIS — I69391 Dysphagia following cerebral infarction: Secondary | ICD-10-CM | POA: Diagnosis not present

## 2023-04-12 DIAGNOSIS — R2681 Unsteadiness on feet: Secondary | ICD-10-CM | POA: Diagnosis not present

## 2023-04-12 DIAGNOSIS — R29898 Other symptoms and signs involving the musculoskeletal system: Secondary | ICD-10-CM | POA: Diagnosis not present

## 2023-04-12 DIAGNOSIS — I69320 Aphasia following cerebral infarction: Secondary | ICD-10-CM | POA: Diagnosis not present

## 2023-04-12 DIAGNOSIS — M6281 Muscle weakness (generalized): Secondary | ICD-10-CM | POA: Diagnosis not present

## 2023-04-12 DIAGNOSIS — R278 Other lack of coordination: Secondary | ICD-10-CM | POA: Diagnosis not present

## 2023-04-13 ENCOUNTER — Other Ambulatory Visit: Payer: Self-pay | Admitting: Family Medicine

## 2023-04-13 NOTE — Telephone Encounter (Signed)
Copied from CRM [id number]. Topic: Clinical - Medication Refill >> Apr 13, 2023  1:33 PM Leavy Cella D wrote: Most Recent Primary Care Visit:  Provider: Gershon Crane A  Department: [facility]  Visit Type: HOSPITAL FU  Date: 02/13/2023  Medication: ***  Has the patient contacted their pharmacy?  (Agent: If no, request that the patient contact the pharmacy for the refill. If patient does not wish to contact the pharmacy document the reason why and proceed with request.) (Agent: If yes, when and what did the pharmacy advise?)  Is this the correct pharmacy for this prescription?  If no, delete pharmacy and type the correct one.  This is the patient's preferred pharmacy:  [id number] [place], [place] - [address] [place] [address] Phone: [phone] Fax: [phone]   Has the prescription been filled recently?   Is the patient out of the medication?   Has the patient been seen for an appointment in the last year OR does the patient have an upcoming appointment?   Can we respond through MyChart?   Agent: Please be advised that Rx refills may take up to 3 business days. We ask that you follow-up with your pharmacy.

## 2023-04-14 ENCOUNTER — Other Ambulatory Visit: Payer: Self-pay

## 2023-04-14 ENCOUNTER — Ambulatory Visit: Payer: Self-pay | Admitting: Family Medicine

## 2023-04-14 DIAGNOSIS — I69391 Dysphagia following cerebral infarction: Secondary | ICD-10-CM | POA: Diagnosis not present

## 2023-04-14 DIAGNOSIS — R29898 Other symptoms and signs involving the musculoskeletal system: Secondary | ICD-10-CM | POA: Diagnosis not present

## 2023-04-14 DIAGNOSIS — R278 Other lack of coordination: Secondary | ICD-10-CM | POA: Diagnosis not present

## 2023-04-14 DIAGNOSIS — R2681 Unsteadiness on feet: Secondary | ICD-10-CM | POA: Diagnosis not present

## 2023-04-14 DIAGNOSIS — I69320 Aphasia following cerebral infarction: Secondary | ICD-10-CM | POA: Diagnosis not present

## 2023-04-14 DIAGNOSIS — M6281 Muscle weakness (generalized): Secondary | ICD-10-CM | POA: Diagnosis not present

## 2023-04-14 MED ORDER — CIPROFLOXACIN HCL 500 MG PO TABS: 500.0000 mg | ORAL_TABLET | Freq: Two times a day (BID) | ORAL | 1 refills | Status: DC

## 2023-04-14 NOTE — Telephone Encounter (Addendum)
Pt husband is calling and his wife is having blood in urine  [id number] [place], [place] - [address] Phone: [phone]  Fax: [phone]    Pt has an appt on 04-17-2023

## 2023-04-14 NOTE — Telephone Encounter (Signed)
Copied from CRM [id number]. Topic: Clinical - Red Word Triage >> Apr 14, 2023  9:25 AM Larwance Sachs wrote: Red Word that prompted transfer to Nurse Triage: Patient spouse called in regarding uti symptoms reappearing, blood in urine.   Chief Complaint: Blood in urine Symptoms: Bloody urine Frequency: Continual, gradual increase and decrease Pertinent Negatives: Patient denies blood clots in urine or  Disposition: [] ED /[x] Urgent Care (no appt availability in office) / [x] Appointment(In office/virtual)/ []  [facility] [facility]/ [] Home Care/ [x] Refused Recommended Disposition /[] [facility]/ []  Follow-up with PCP Additional Notes: Pt reporting urine started "looking red bloody yesterday afternoon, been drinking cranberry juice, still red bloody color." Pt reporting bleeding started "just yesterday," so she called line immediately, it was "first time I could tell it was bloody but was feeling bloaty-ness before." Pt reporting she had recent UTI and was taking ciprofloxacin 10/20-10/27. Pt and husband reporting that they had spoken to agent yesterday and were told that a refill would be sent for the medication right away, but not received word back. It was confirmed that they did not speak to a triage nurse yesterday. Nurse reassured pt and husband that continue triage to ensure best next steps would allow for best care. Pt reporting blood in urine "several times, maybe 5 or 6 times" yesterday, pt has had "just a speck of blood today." Pt confirms not thick red, thin "wet" on toilet paper and in toilet. Pt reporting having urinated "3x since midnight" but confirms less blood in the urine each time, "just a discoloration almost, not real bloody like it was yesterday afternoon." Pt reporting "there's a feeling before you see blood, it's indescribable, feel like you have to go to the bathroom, feel like have to have a bowel movement at the same time, occasionally." Pt reporting confirms "no blood comes  out when not on toilet." Pt confirms pretty painful but not severe, "painful before start" urinating and during. Pt denies fever at this time. Pt confirms passing urine more frequently than usual. Pt reporting a "slight back pain," pain is "down low" in back. Pt confirms she feels dizzy/woozy "all the time" since having stroke on 10/1, so "hard to tell" if experiencing with this hematuria. Advised pt be examined in office today. Husband requesting that refill for med be sent without need for appt. Advised exam to ensure proper course of care, proper med if needed. Husband requesting appt today. No availability at PCP office today, offered to schedule appt at other nearby office or UC for today, husband refused, they "want to see the people that know [place]." Husband is upset about being told different things yesterday vs today, husband reporting that pt has these UTIs with these symptoms "all the time," asking for med. Offered to give number for Patient Experience line, husband refused. Offered to link to CAL line to speak with office. Connected call to CAL line, front desk confirmed no availability today, husband insisting on talking with front desk, received consent from front desk to merge the call. Husband talking to front desk.  Reason for Disposition  Side (flank) or back pain present  Answer Assessment - Initial Assessment Questions 1. COLOR of URINE: "Describe the color of the urine."  (e.g., tea-colored, pink, red, bloody) "Do you have blood clots in your urine?" (e.g., none, pea, grape, small coin)     Pt reporting urine started "looking red bloody yesterday afternoon, been drinking cranberry juice, still red bloody color." Pt confirms not thick red, thin "wet"  on toilet paper and in toilet.  2. ONSET: "When did the bleeding start?"      Pt reporting bleeding started "just yesterday," so she called line immediately, it was "first time I could tell it was bloody but was feeling bloaty-ness before." 3.  EPISODES: "How many times has there been blood in the urine?" or "How many times today?"     Pt reporting blood in urine "several times, maybe 5 or 6 times" yesterday, pt has had "just a speck of blood today." Pt confirms not thick red, thin "wet" on toilet paper and in toilet. Pt reporting having urinated "3x since midnight" but confirms less blood in the urine each time, "just a discoloration almost, not real bloody like it was yesterday afternoon." 4. PAIN with URINATION: "Is there any pain with passing your urine?" If Yes, ask: "How bad is the pain?"  (Scale 1-10; or mild, moderate, severe)    - MILD: Complains slightly about urination hurting.    - MODERATE: Interferes with normal activities.      - SEVERE: Excruciating, unwilling or unable to urinate because of the pain.      Pt confirms pretty painful but not severe, "painful before start" urinating and during. 5. FEVER: "Do you have a fever?" If Yes, ask: "What is your temperature, how was it measured, and when did it start?"     Pt denies fever at this time. 6. ASSOCIATED SYMPTOMS: "Are you passing urine more frequently than usual?"     Pt confirms passing urine more frequently than usual. 7. OTHER SYMPTOMS: "Do you have any other symptoms?" (e.g., back/flank pain, abdomen pain, vomiting)     Pt confirms passing urine more frequently than usual. Pt reporting a "slight back pain," pain is "down low" in back. Pt confirms she feels dizzy/woozy "all the time" since having stroke on 10/1, so "hard to tell" if experiencing with this hematuria.  Protocols used: Urine - Blood In-A-AH

## 2023-04-14 NOTE — Telephone Encounter (Signed)
Call in Cipro 500 mg BID for 7 days with one refill

## 2023-04-14 NOTE — Telephone Encounter (Signed)
Reviewed Dr Clent Ridges advise with pt, Rx for Cipro sent to pt pharmacy

## 2023-04-16 ENCOUNTER — Other Ambulatory Visit: Payer: Self-pay | Admitting: Family Medicine

## 2023-04-16 DIAGNOSIS — I10 Essential (primary) hypertension: Secondary | ICD-10-CM

## 2023-04-17 ENCOUNTER — Encounter: Payer: Self-pay | Admitting: Family Medicine

## 2023-04-17 ENCOUNTER — Ambulatory Visit (INDEPENDENT_AMBULATORY_CARE_PROVIDER_SITE_OTHER): Payer: Medicare Other | Admitting: Family Medicine

## 2023-04-17 VITALS — BP 152/90 | HR 94 | Temp 98.1°F | Wt 101.0 lb

## 2023-04-17 DIAGNOSIS — R600 Localized edema: Secondary | ICD-10-CM

## 2023-04-17 DIAGNOSIS — I10 Essential (primary) hypertension: Secondary | ICD-10-CM

## 2023-04-17 DIAGNOSIS — N390 Urinary tract infection, site not specified: Secondary | ICD-10-CM | POA: Diagnosis not present

## 2023-04-17 DIAGNOSIS — R319 Hematuria, unspecified: Secondary | ICD-10-CM

## 2023-04-17 LAB — POC URINALSYSI DIPSTICK (AUTOMATED)
Bilirubin, UA: NEGATIVE
Glucose, UA: NEGATIVE
Ketones, UA: NEGATIVE
Leukocytes, UA: NEGATIVE
Nitrite, UA: NEGATIVE
Protein, UA: POSITIVE — AB
Spec Grav, UA: 1.025 (ref 1.010–1.025)
Urobilinogen, UA: 0.2 U/dL
pH, UA: 5 (ref 5.0–8.0)

## 2023-04-17 MED ORDER — NITROFURANTOIN MONOHYD MACRO 100 MG PO CAPS: 100.0000 mg | ORAL_CAPSULE | Freq: Two times a day (BID) | ORAL | 0 refills | Status: DC

## 2023-04-17 MED ORDER — LOSARTAN POTASSIUM 50 MG PO TABS: 100.0000 mg | ORAL_TABLET | Freq: Every day | ORAL | Status: DC

## 2023-04-17 MED ORDER — PHENAZOPYRIDINE HCL 200 MG PO TABS: 200.0000 mg | ORAL_TABLET | Freq: Three times a day (TID) | ORAL | 0 refills | Status: DC | PRN

## 2023-04-17 NOTE — Addendum Note (Signed)
Addended by: Carola Rhine on: 04/17/2023 02:36 PM   Modules accepted: Orders

## 2023-04-17 NOTE — Progress Notes (Signed)
   Subjective:    Patient ID: Mary Mercado, female    DOB: Apr 15, 1941, 82 y.o.   MRN: [id number]  HPI Here with her husband for several issues. First she still has urgency to urinate with burning and occasionally with blood in the urine. No fever. She also has some lower abdominal pains. This started 6 days ago, and last week we sent in some Cipro for her to take. This has not been effective. She is drinking lots of water and cranberry juice. Second her BP has been running a little high for several weeks, averaging in the 150's over 90's. No chest pain or SOB. Third she has had swelling in her feet and ankles for a month or so.    Review of Systems  Constitutional: Negative.   Respiratory: Negative.    Cardiovascular: Negative.   Gastrointestinal:  Positive for abdominal pain. Negative for abdominal distention, blood in stool, constipation, diarrhea, nausea, rectal pain and vomiting.  Genitourinary:  Positive for dysuria, frequency, hematuria and urgency.       Objective:   Physical Exam Constitutional:      General: She is not in acute distress.    Comments: Walks with a walker   Cardiovascular:     Rate and Rhythm: Normal rate and regular rhythm.     Pulses: Normal pulses.     Heart sounds: Normal heart sounds.  Pulmonary:     Effort: Pulmonary effort is normal.     Breath sounds: Normal breath sounds.  Abdominal:     General: Abdomen is flat. Bowel sounds are normal. There is no distension.     Palpations: Abdomen is soft. There is no mass.     Tenderness: There is no abdominal tenderness. There is no right CVA tenderness, left CVA tenderness, guarding or rebound.     Hernia: No hernia is present.  Musculoskeletal:     Comments: 1+ edema in both ankles   Neurological:     Mental Status: She is alert.           Assessment & Plan:  She has a partially treated UTI. We will stop the Cipro and start her on 10 days of Bactrim DS. She can use Pyridium as needed  for the pain and burning. Send the sample for a culture. Her HTN is not well treated so we will increase the Losartan to 100 mg daily. They will report back in 2 weeks. For the ankle edema, she will elevate the legs and she will wear compression stockings.  Gershon Crane, MD

## 2023-04-18 LAB — URINE CULTURE
MICRO NUMBER:: 15883397
Result:: NO GROWTH
SPECIMEN QUALITY:: ADEQUATE

## 2023-04-24 DIAGNOSIS — M6281 Muscle weakness (generalized): Secondary | ICD-10-CM | POA: Diagnosis not present

## 2023-04-24 DIAGNOSIS — I69391 Dysphagia following cerebral infarction: Secondary | ICD-10-CM | POA: Diagnosis not present

## 2023-04-24 DIAGNOSIS — R2681 Unsteadiness on feet: Secondary | ICD-10-CM | POA: Diagnosis not present

## 2023-04-24 DIAGNOSIS — R29898 Other symptoms and signs involving the musculoskeletal system: Secondary | ICD-10-CM | POA: Diagnosis not present

## 2023-04-24 DIAGNOSIS — R278 Other lack of coordination: Secondary | ICD-10-CM | POA: Diagnosis not present

## 2023-04-24 DIAGNOSIS — I69320 Aphasia following cerebral infarction: Secondary | ICD-10-CM | POA: Diagnosis not present

## 2023-04-25 DIAGNOSIS — I69320 Aphasia following cerebral infarction: Secondary | ICD-10-CM | POA: Diagnosis not present

## 2023-04-25 DIAGNOSIS — R278 Other lack of coordination: Secondary | ICD-10-CM | POA: Diagnosis not present

## 2023-04-25 DIAGNOSIS — R2681 Unsteadiness on feet: Secondary | ICD-10-CM | POA: Diagnosis not present

## 2023-04-25 DIAGNOSIS — I69391 Dysphagia following cerebral infarction: Secondary | ICD-10-CM | POA: Diagnosis not present

## 2023-04-25 DIAGNOSIS — M6281 Muscle weakness (generalized): Secondary | ICD-10-CM | POA: Diagnosis not present

## 2023-04-25 DIAGNOSIS — R29898 Other symptoms and signs involving the musculoskeletal system: Secondary | ICD-10-CM | POA: Diagnosis not present

## 2023-04-28 DIAGNOSIS — R2681 Unsteadiness on feet: Secondary | ICD-10-CM | POA: Diagnosis not present

## 2023-04-28 DIAGNOSIS — I69391 Dysphagia following cerebral infarction: Secondary | ICD-10-CM | POA: Diagnosis not present

## 2023-04-28 DIAGNOSIS — M6281 Muscle weakness (generalized): Secondary | ICD-10-CM | POA: Diagnosis not present

## 2023-04-28 DIAGNOSIS — R278 Other lack of coordination: Secondary | ICD-10-CM | POA: Diagnosis not present

## 2023-04-28 DIAGNOSIS — I69320 Aphasia following cerebral infarction: Secondary | ICD-10-CM | POA: Diagnosis not present

## 2023-05-01 ENCOUNTER — Encounter: Payer: Self-pay | Admitting: Internal Medicine

## 2023-05-01 ENCOUNTER — Ambulatory Visit: Payer: Medicare Other | Attending: Internal Medicine | Admitting: Internal Medicine

## 2023-05-01 VITALS — BP 164/98 | HR 69 | Ht 65.0 in | Wt 98.4 lb

## 2023-05-01 DIAGNOSIS — Z9889 Other specified postprocedural states: Secondary | ICD-10-CM | POA: Diagnosis not present

## 2023-05-01 DIAGNOSIS — I10 Essential (primary) hypertension: Secondary | ICD-10-CM | POA: Insufficient documentation

## 2023-05-01 DIAGNOSIS — Z950 Presence of cardiac pacemaker: Secondary | ICD-10-CM | POA: Insufficient documentation

## 2023-05-01 DIAGNOSIS — Z79899 Other long term (current) drug therapy: Secondary | ICD-10-CM | POA: Diagnosis not present

## 2023-05-01 DIAGNOSIS — I4821 Permanent atrial fibrillation: Secondary | ICD-10-CM | POA: Insufficient documentation

## 2023-05-01 MED ORDER — AMLODIPINE BESYLATE 2.5 MG PO TABS: 2.5000 mg | ORAL_TABLET | Freq: Every day | ORAL | 3 refills | Status: DC

## 2023-05-01 NOTE — Patient Instructions (Addendum)
 Medication Instructions:  Your physician has recommended you make the following change in your medication:   ** Begin Amlodipine  2.5mg  - 1 tablet by mouth daily.   *If you need a refill on your cardiac medications before your next appointment, please call your pharmacy*   Lab Work: CMET today  If you have labs (blood work) drawn today and your tests are completely normal, you will receive your results only by: MyChart Message (if you have MyChart) OR A paper copy in the mail If you have any lab test that is abnormal or we need to change your treatment, we will call you to review the results.   Testing/Procedures: None ordered.    Follow-Up: At [facility], you and your health needs are our priority.  As part of our continuing mission to provide you with exceptional heart care, we have created designated Provider Care Teams.  These Care Teams include your primary Cardiologist (physician) and Advanced Practice Providers (APPs -  Physician Assistants and Nurse Practitioners) who all work together to provide you with the care you need, when you need it.  We recommend signing up for the patient portal called MyChart.  Sign up information is provided on this After Visit Summary.  MyChart is used to connect with patients for Virtual Visits (Telemedicine).  Patients are able to view lab/test results, encounter notes, upcoming appointments, etc.  Non-urgent messages can be sent to your provider as well.   To learn more about what you can do with MyChart, go to [email or web address].    Your next appointment:   12 months  4 months with Dr Alveta

## 2023-05-01 NOTE — Progress Notes (Signed)
 Patient Care Team: Johnny Garnette LABOR, MD as PCP - General Nahser, Aleene PARAS, MD as PCP - Cardiology (Cardiology)   HPI  [facility] Fleece is a 83 y.o. female Seen in follow-up for atrial fibrillation with a rapid ventricular response for which she underwent AV junction ablation and pacing 12/21 with 2 RV leads, one RV apical and one left bundle branch block area. History of seizure disorder and an interval  COVID 9/24 2 treated with Paxlovid  at which time her Pradaxa  dose was cut down to daily.  She then presented with a stroke 10/24 having presented with acute left-sided weakness and underwent emergent thrombectomy.  Complains of pain over her left side upper chest near her pacemaker but also radiating broadly.  Seems to feel better if she does not breathe.  Is not worse with deep breathing.  Eats reasonably.  Continues to lose weight.  Has frequent BMs.      DATE TEST EF    2/20 TEE  60-65 % LAE severe  9/21 Echo   60-65 % BAE severe (LA 18ml/m2)   10/24 Echo  60-65% TR severe; BAE severe    Date Cr K Hgb  11/21 0.8 3.5 14.0   3/22 0.63 3.5 13.4  10/24 0.69 3.6 13.3    Thromboembolic risk factors ( age  -2, HTN-1, TIA/CVA-2, Gender-1) for a CHADSVASc Score of 6     Records and Results Reviewed   Past Medical History:  Diagnosis Date   Allergy    CVA (cerebral vascular accident) (HCC) 05/2018, 01/09/20   Hyperlipidemia LDL goal <70    Hypertension    Pacemaker    Palpitations    Vertigo, benign positional     Past Surgical History:  Procedure Laterality Date   AV NODE ABLATION N/A 03/30/2020   Procedure: AV NODE ABLATION;  Surgeon: Fernande Elspeth BROCKS, MD;  Location: [facility] INVASIVE CV LAB;  Service: Cardiovascular;  Laterality: N/A;   AV NODE ABLATION N/A 03/30/2020   Procedure: AV NODE ABLATION;  Surgeon: Fernande Elspeth BROCKS, MD;  Location: [facility] INVASIVE CV LAB;  Service: Cardiovascular;  Laterality: N/A;   CARDIOVERSION Left 10/31/2018   Procedure: CARDIOVERSION;   Surgeon: Alveta Aleene PARAS, MD;  Location: [facility] ENDOSCOPY;  Service: Cardiovascular;  Laterality: Left;   CARDIOVERSION N/A 01/08/2020   Procedure: CARDIOVERSION;  Surgeon: Loni Soyla LABOR, MD;  Location: [facility] ENDOSCOPY;  Service: Cardiovascular;  Laterality: N/A;   Hemilaminectomy and microdiskectomy at L4-5 on the left.  12/10/2003   IR CT HEAD LTD  01/24/2023   IR PERCUTANEOUS ART THROMBECTOMY/INFUSION INTRACRANIAL INC DIAG ANGIO  01/24/2023   IR US  GUIDE VASC ACCESS RIGHT  01/24/2023   PACEMAKER IMPLANT N/A 03/30/2020   Procedure: PACEMAKER IMPLANT;  Surgeon: Fernande Elspeth BROCKS, MD;  Location[facility] INVASIVE CV LAB;  Service: Cardiovascular;  Laterality: N/A;   RADIOLOGY WITH ANESTHESIA N/A 01/24/2023   Procedure: IR WITH ANESTHESIA;  Surgeon: Radiologist, Medication, MD;  Location: [facility] OR;  Service: Radiology;  Laterality: N/A;   TEE WITHOUT CARDIOVERSION N/A 06/05/2018   Procedure: TRANSESOPHAGEAL ECHOCARDIOGRAM (TEE);  Surgeon: Pietro Redell RAMAN, MD;  Location: [facility] ENDOSCOPY;  Service: Cardiovascular;  Laterality: N/A;  loop    Current Meds  Medication Sig   atorvastatin  (LIPITOR ) 80 MG tablet Take 1 tablet (80 mg total) by mouth daily.   dabigatran  (PRADAXA ) 150 MG CAPS capsule Take 1 capsule (150 mg total) by mouth every 12 (twelve) hours.   dorzolamide -timolol  (COSOPT ) 2-0.5 % ophthalmic solution  Place 1 drop into both eyes 2 (two) times daily.   levETIRAcetam  (KEPPRA ) 500 MG tablet TAKE 1 TABLET BY MOUTH TWICE A DAY   losartan  (COZAAR ) 50 MG tablet Take 2 tablets (100 mg total) by mouth daily.   metoprolol  tartrate (LOPRESSOR ) 25 MG tablet Take 0.5 tablets (12.5 mg total) by mouth at bedtime.   phenazopyridine  (PYRIDIUM ) 200 MG tablet Take 1 tablet (200 mg total) by mouth 3 (three) times daily as needed (urinary pain).   Tafluprost, PF, 0.0015 % SOLN Apply to eye.   [DISCONTINUED] ciprofloxacin  (CIPRO ) 500 MG tablet Take 1 tablet (500 mg total) by mouth 2 (two) times daily.   [DISCONTINUED]  fluorouracil  (EFUDEX ) 5 % cream Apply 1 Application topically 2 (two) times daily.   [DISCONTINUED] lidocaine  (LIDODERM ) 5 % Place 1 patch onto the skin every 12 (twelve) hours as needed (Right rib pain). Remove & Discard patch within 12 hours or as directed by MD   [DISCONTINUED] nitrofurantoin , macrocrystal-monohydrate, (MACROBID ) 100 MG capsule Take 1 capsule (100 mg total) by mouth 2 (two) times daily.    Allergies  Allergen Reactions   Duloxetine      Causes Bp elevation.  Increased body jerks, nausea   Acetaminophen  Hives and Rash    Other Reaction(s): Other (See Comments)  rash      Review of Systems negative except from HPI and PMH  Physical Exam BP (!) 164/98   Pulse 69   Ht 5' 5 (1.651 m)   Wt 98 lb 6.4 oz (44.6 kg)   SpO2 99%   BMI 16.37 kg/m  Well developed and well nourished in no acute distress HENT normal Neck supple with JVP-flat Clear Device pocket well healed; without hematoma or erythema.  There is no tethering  Regular rate and rhythm, no  gallop No murmur Abd-soft with active BS No Clubbing cyanosis  edema Skin-warm and dry A & Oriented  Grossly normal sensory and motor function  ECG atrial fibrillation with ventricular pacing at 70 -/13/40  Device function is normal. Programming changes   See Paceart for details    CrCl cannot be calculated (Patient's most recent lab result is older than the maximum 21 days allowed.).   Assessment and  Plan Atrial fibrillation-permanent  AV ablation  Pacemaker-[employer]tronic-2 V ventricular leads  Protein calorie malnutrition   AF rate controlled; continue dabigitran  Estimated Creatinine Clearance: 37.5 mL/min (A) (by C-G formula based on SCr of 0.56 mg/dL (L)).  Dosed appropriately with CrCL> 30  BP elevated; will begin low dose amlodipine  2.5   Continues to lose weight

## 2023-05-02 DIAGNOSIS — R2681 Unsteadiness on feet: Secondary | ICD-10-CM | POA: Diagnosis not present

## 2023-05-02 DIAGNOSIS — M6281 Muscle weakness (generalized): Secondary | ICD-10-CM | POA: Diagnosis not present

## 2023-05-02 DIAGNOSIS — R278 Other lack of coordination: Secondary | ICD-10-CM | POA: Diagnosis not present

## 2023-05-02 DIAGNOSIS — I69320 Aphasia following cerebral infarction: Secondary | ICD-10-CM | POA: Diagnosis not present

## 2023-05-02 DIAGNOSIS — I69391 Dysphagia following cerebral infarction: Secondary | ICD-10-CM | POA: Diagnosis not present

## 2023-05-02 LAB — COMPREHENSIVE METABOLIC PANEL
ALT: 26 [IU]/L (ref 0–32)
AST: 33 [IU]/L (ref 0–40)
Albumin: 4.5 g/dL (ref 3.7–4.7)
Alkaline Phosphatase: 94 [IU]/L (ref 44–121)
BUN/Creatinine Ratio: 29 — ABNORMAL HIGH (ref 12–28)
BUN: 16 mg/dL (ref 8–27)
Bilirubin Total: 1.3 mg/dL — ABNORMAL HIGH (ref 0.0–1.2)
CO2: 26 mmol/L (ref 20–29)
Calcium: 9.4 mg/dL (ref 8.7–10.3)
Chloride: 101 mmol/L (ref 96–106)
Creatinine, Ser: 0.56 mg/dL — ABNORMAL LOW (ref 0.57–1.00)
Globulin, Total: 2.7 g/dL (ref 1.5–4.5)
Glucose: 121 mg/dL — ABNORMAL HIGH (ref 70–99)
Potassium: 5.3 mmol/L — ABNORMAL HIGH (ref 3.5–5.2)
Sodium: 140 mmol/L (ref 134–144)
Total Protein: 7.2 g/dL (ref 6.0–8.5)
eGFR: 90 mL/min/{1.73_m2} (ref >59)

## 2023-05-03 DIAGNOSIS — M6281 Muscle weakness (generalized): Secondary | ICD-10-CM | POA: Diagnosis not present

## 2023-05-03 DIAGNOSIS — R278 Other lack of coordination: Secondary | ICD-10-CM | POA: Diagnosis not present

## 2023-05-03 DIAGNOSIS — I69320 Aphasia following cerebral infarction: Secondary | ICD-10-CM | POA: Diagnosis not present

## 2023-05-03 DIAGNOSIS — R2681 Unsteadiness on feet: Secondary | ICD-10-CM | POA: Diagnosis not present

## 2023-05-03 DIAGNOSIS — I69391 Dysphagia following cerebral infarction: Secondary | ICD-10-CM | POA: Diagnosis not present

## 2023-05-04 DIAGNOSIS — R2681 Unsteadiness on feet: Secondary | ICD-10-CM | POA: Diagnosis not present

## 2023-05-04 DIAGNOSIS — I69320 Aphasia following cerebral infarction: Secondary | ICD-10-CM | POA: Diagnosis not present

## 2023-05-04 DIAGNOSIS — I69391 Dysphagia following cerebral infarction: Secondary | ICD-10-CM | POA: Diagnosis not present

## 2023-05-04 DIAGNOSIS — R278 Other lack of coordination: Secondary | ICD-10-CM | POA: Diagnosis not present

## 2023-05-04 DIAGNOSIS — M6281 Muscle weakness (generalized): Secondary | ICD-10-CM | POA: Diagnosis not present

## 2023-05-05 ENCOUNTER — Ambulatory Visit (INDEPENDENT_AMBULATORY_CARE_PROVIDER_SITE_OTHER): Payer: Medicare Other | Admitting: Family Medicine

## 2023-05-05 ENCOUNTER — Encounter: Payer: Self-pay | Admitting: Family Medicine

## 2023-05-05 VITALS — BP 142/88 | Temp 97.5°F | Wt 99.4 lb

## 2023-05-05 DIAGNOSIS — R739 Hyperglycemia, unspecified: Secondary | ICD-10-CM | POA: Diagnosis not present

## 2023-05-05 DIAGNOSIS — N390 Urinary tract infection, site not specified: Secondary | ICD-10-CM

## 2023-05-05 DIAGNOSIS — I10 Essential (primary) hypertension: Secondary | ICD-10-CM | POA: Diagnosis not present

## 2023-05-05 DIAGNOSIS — R319 Hematuria, unspecified: Secondary | ICD-10-CM | POA: Diagnosis not present

## 2023-05-05 DIAGNOSIS — R634 Abnormal weight loss: Secondary | ICD-10-CM

## 2023-05-05 DIAGNOSIS — I4821 Permanent atrial fibrillation: Secondary | ICD-10-CM | POA: Diagnosis not present

## 2023-05-05 DIAGNOSIS — E039 Hypothyroidism, unspecified: Secondary | ICD-10-CM | POA: Diagnosis not present

## 2023-05-05 LAB — POC URINALSYSI DIPSTICK (AUTOMATED)
Bilirubin, UA: NEGATIVE
Glucose, UA: NEGATIVE
Ketones, UA: NEGATIVE
Nitrite, UA: NEGATIVE
Protein, UA: POSITIVE — AB
Spec Grav, UA: 1.02 (ref 1.010–1.025)
Urobilinogen, UA: 1 U/dL
pH, UA: 5.5 (ref 5.0–8.0)

## 2023-05-05 LAB — CBC WITH DIFFERENTIAL/PLATELET
Basophils Absolute: 0 10*3/uL (ref 0.0–0.1)
Basophils Relative: 0.9 % (ref 0.0–3.0)
Eosinophils Absolute: 0.1 10*3/uL (ref 0.0–0.7)
Eosinophils Relative: 1.9 % (ref 0.0–5.0)
HCT: 45.2 % (ref 36.0–46.0)
Hemoglobin: 14.8 g/dL (ref 12.0–15.0)
Lymphocytes Relative: 20.1 % (ref 12.0–46.0)
Lymphs Abs: 1 10*3/uL (ref 0.7–4.0)
MCHC: 32.8 g/dL (ref 30.0–36.0)
MCV: 94.8 fL (ref 78.0–100.0)
Monocytes Absolute: 0.6 10*3/uL (ref 0.1–1.0)
Monocytes Relative: 11.1 % (ref 3.0–12.0)
Neutro Abs: 3.5 10*3/uL (ref 1.4–7.7)
Neutrophils Relative %: 66 % (ref 43.0–77.0)
Platelets: 166 10*3/uL (ref 150.0–400.0)
RBC: 4.76 Mil/uL (ref 3.87–5.11)
RDW: 14.2 % (ref 11.5–15.5)
WBC: 5.2 10*3/uL (ref 4.0–10.5)

## 2023-05-05 LAB — HEMOGLOBIN A1C: Hgb A1c MFr Bld: 6.5 % (ref 4.6–6.5)

## 2023-05-05 LAB — TSH: TSH: 3.58 u[IU]/mL (ref 0.35–5.50)

## 2023-05-05 MED ORDER — CEFTRIAXONE SODIUM 500 MG IJ SOLR
500.0000 mg | Freq: Once | INTRAMUSCULAR | Status: AC
Start: 2023-05-05 — End: 2023-05-05
  Administered 2023-05-05: 500 mg via INTRAMUSCULAR

## 2023-05-05 MED ORDER — SULFAMETHOXAZOLE-TRIMETHOPRIM 800-160 MG PO TABS: 1.0000 | ORAL_TABLET | Freq: Two times a day (BID) | ORAL | 0 refills | Status: DC

## 2023-05-05 NOTE — Progress Notes (Signed)
   Subjective:    Patient ID: Mary Mercado, female    DOB: 07/31/1940, 83 y.o.   MRN: [id number]  HPI Here with her husband to follow up on a UTI and on weight loss. Since we last saw her she has taken 7 days of Cipro  and then 7 days of Nitrofurantoin , but her UTI symptoms have not improved. She still feels bloated in the abdomen, she has urgency to urinate, and she feels mild pain in the left flank. Her urine has been dark. No fever or nausea. We did a urine culture on 04-17-23, but no bacteria were isolated. She is drinking plenty of water. At our last visit we also discussed her weight loss. Since then she has been eating larger meals and has been snacking in between meals. She has gained one pound since we saw her. In addition she saw Dr. Fernande a few days ago, and he adjusted her pacemaker to 70 at night and 80 during the day. He also added Amlodipine  2.5 mg daily to her regimen. Her BP has come down nicely since then.    Review of Systems  Constitutional:  Positive for fatigue. Negative for diaphoresis and fever.  Respiratory: Negative.    Cardiovascular: Negative.   Gastrointestinal: Negative.   Genitourinary:  Positive for flank pain, frequency, hematuria and urgency. Negative for dysuria.       Objective:   Physical Exam Constitutional:      Appearance: She is not ill-appearing.     Comments: Frail   Cardiovascular:     Rate and Rhythm: Normal rate and regular rhythm.     Pulses: Normal pulses.     Heart sounds: Normal heart sounds.  Pulmonary:     Effort: Pulmonary effort is normal.     Breath sounds: Normal breath sounds.  Abdominal:     General: Abdomen is flat. Bowel sounds are normal. There is no distension.     Palpations: Abdomen is soft. There is no mass.     Tenderness: There is left CVA tenderness. There is no right CVA tenderness, guarding or rebound.     Hernia: No hernia is present.     Comments: Mildly tender in the left flank  Neurological:      Mental Status: She is alert.           Assessment & Plan:  She still has a UTI, so we will give her a shot of Rocephin  and start her on 10 days of Bactrim  DS. We will get another urine culture. Follow up next week. For the weight issue, she will continue to consume as many calories as she can. There is also a nutritionist at [facility], and they can meet with her to discuss her diet. Her HTN is now under control, and her atrial fibrillation is stable. We spent a total of (35   ) minutes reviewing records and discussing these issues.  Mary Olmsted, MD

## 2023-05-05 NOTE — Addendum Note (Signed)
 Addended by: Carola Rhine on: 05/05/2023 02:38 PM   Modules accepted: Orders

## 2023-05-06 LAB — CUP PACEART INCLINIC DEVICE CHECK
Date Time Interrogation Session: 20250106145832
Implantable Lead Connection Status: 753985
Implantable Lead Connection Status: 753985
Implantable Lead Implant Date: 20211206
Implantable Lead Implant Date: 20211206
Implantable Lead Location: 753858
Implantable Lead Location: 753860
Implantable Lead Model: 3830
Implantable Lead Model: 5076
Implantable Pulse Generator Implant Date: 20211206

## 2023-05-06 LAB — URINE CULTURE
MICRO NUMBER:: 15943337
Result:: NO GROWTH
SPECIMEN QUALITY:: ADEQUATE

## 2023-05-08 DIAGNOSIS — R2681 Unsteadiness on feet: Secondary | ICD-10-CM | POA: Diagnosis not present

## 2023-05-08 DIAGNOSIS — R278 Other lack of coordination: Secondary | ICD-10-CM | POA: Diagnosis not present

## 2023-05-08 DIAGNOSIS — I69391 Dysphagia following cerebral infarction: Secondary | ICD-10-CM | POA: Diagnosis not present

## 2023-05-08 DIAGNOSIS — M6281 Muscle weakness (generalized): Secondary | ICD-10-CM | POA: Diagnosis not present

## 2023-05-08 DIAGNOSIS — I69320 Aphasia following cerebral infarction: Secondary | ICD-10-CM | POA: Diagnosis not present

## 2023-05-10 ENCOUNTER — Other Ambulatory Visit: Payer: Self-pay | Admitting: Cardiovascular Disease

## 2023-05-10 ENCOUNTER — Other Ambulatory Visit: Payer: Self-pay

## 2023-05-10 DIAGNOSIS — I4819 Other persistent atrial fibrillation: Secondary | ICD-10-CM

## 2023-05-10 NOTE — Patient Outreach (Signed)
 Telephone outreach to patient to obtain mRS was successfully completed. MRS= 1  Vanice Sarah [facility] Health Care Management Assistant  Direct Dial: [phone]  Fax: [phone] Website: [email or web address]

## 2023-05-15 ENCOUNTER — Ambulatory Visit (INDEPENDENT_AMBULATORY_CARE_PROVIDER_SITE_OTHER): Payer: Medicare Other | Admitting: Neurology

## 2023-05-15 ENCOUNTER — Encounter: Payer: Self-pay | Admitting: Neurology

## 2023-05-15 VITALS — BP 140/83 | HR 73 | Ht 65.0 in | Wt 97.0 lb

## 2023-05-15 DIAGNOSIS — I6601 Occlusion and stenosis of right middle cerebral artery: Secondary | ICD-10-CM | POA: Diagnosis not present

## 2023-05-15 DIAGNOSIS — Z8669 Personal history of other diseases of the nervous system and sense organs: Secondary | ICD-10-CM

## 2023-05-15 DIAGNOSIS — I69391 Dysphagia following cerebral infarction: Secondary | ICD-10-CM | POA: Diagnosis not present

## 2023-05-15 DIAGNOSIS — R2681 Unsteadiness on feet: Secondary | ICD-10-CM | POA: Diagnosis not present

## 2023-05-15 DIAGNOSIS — M6281 Muscle weakness (generalized): Secondary | ICD-10-CM | POA: Diagnosis not present

## 2023-05-15 DIAGNOSIS — R278 Other lack of coordination: Secondary | ICD-10-CM | POA: Diagnosis not present

## 2023-05-15 DIAGNOSIS — I69320 Aphasia following cerebral infarction: Secondary | ICD-10-CM | POA: Diagnosis not present

## 2023-05-15 MED ORDER — LEVETIRACETAM 500 MG PO TABS: 500.0000 mg | ORAL_TABLET | Freq: Two times a day (BID) | ORAL | 3 refills | Status: DC

## 2023-05-15 NOTE — Progress Notes (Signed)
[facility] [address] [place]. [place] [address] [phone]       OFFICE FOLLOW-UP NOTE  [name] Date of Birth:  [birth year] Medical Record Number:  [id number]   Reason for visit: Stroke follow-up  Chief complaint: Chief Complaint  Patient presents with   Cerebrovascular Accident    RM 20 with spouse Mary Mercado   Pt is well, reports she is still having some speech and swallowing difficulty. She is also having numbness in BLE    Update 05/15/2023: [name] is a 83 year old pleasant Caucasian lady seen today for office consultation visit for recent stroke.  She is accompanied by her husband.  History is obtained from them and review of electronic medical records.  I personally reviewed pertinent available imaging films in PACS.  She presented to the emergency room on 01/24/2023 as a code stroke for evaluation for sudden onset of speech difficulties left-sided weakness and facial droop.  She presented outside time window for thrombolysis.  She had recently been tested positive for COVID a few weeks ago.  CT angiogram showed right M1 occlusion with changes of widespread intracranial atherosclerosis.  CT perfusion showed 20 cc of infarct core with an extensive penumbra.  She was taken for emergent mechanical thrombectomy which was performed successfully with complete revascularization of the occluded right M1.  Patient could not have an MRI due to pacemaker having the lead in the atrium making it MRI incompatible.  2D echo showed ejection fraction of 60 to 65%.  LDL cholesterol 61 mg percent.  Hemoglobin A1c was 6.6.  Patient did very well clinically and made good recovery.  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  Beam hardening artifact made interpretation difficult for an acute infarct.  Patient had history of A-fib and had previously been on Pradaxa but when  she tested positive for COVID and was started on Paxlovid she was advised to reduce the dose of Pradaxa however the patient and husband misunderstood this and were taking Pradaxa once a day only when she had the stroke patient was transferred to inpatient rehab where she made steady improvement in has been now at home for last 3 months.  She is able to ambulate with a wheeled walker.  She still has mild left-sided weakness but this is mostly from her old stroke.  She has some mild dysphagia as well.  Of late she has noticed some clouding of her vision which may be related to her Glaucoma.  Her blood pressure was recently elevated and her primary care physician added amlodipine which seems to have helped.  She is currently living in retirement community and doing physical therapy several days a week.  She is tolerating Pradaxa well without significant bleeding and only minor bruising.  She is also on Keppra for seizure prophylaxis and takes it twice daily and has done well without recurrent seizures.  HPI:  Update 09/07/2020 ; Patient is seen today accompanied by her husband following recent hospital admission for seizures in March 2022.  She is seen today for a new problem of seizures.  Patient developed sudden onset of speech difficulties on 06/28/2020 with left upper extremity twitching's.  During transport by EMS she also complained of right-sided headache.  Upon arrival in the ER her twitchings resolved after treatment with IV lorazepam except for mild left thumb twitching.  She was loaded with IV Keppra and the involuntary movements stopped.  EEG showed focal right-sided slowing and MRI scan was negative for acute infarct LDL cholesterol was 71 mg percent and home statin dose was continued.  Hemoglobin A1c was 6.4.  Patient was on Pradaxa 150 mg twice daily which was continued.  Patient states she is tolerating Keppra well without any dizziness or other side effects.  She has had no recurrent seizures or  twitching's.  She has had no recurrent stroke or TIA symptoms either.  She is tolerating Pradaxa well without bruising or bleeding.  She is seeing Dr. Frances Furbish for her sleep apnea and has been using her CPAP every night.  Blood pressure is normally well controlled at home the today it is elevated in office at 150/86.  She has no new complaints.  She has no prior history of seizures, significant head injury with loss of consciousness.  But she does have history of multiple strokes in the past2/2020 admitted for left-sided numbness weakness, status post TPA, MRI showed right frontal parietal infarct.  CTA head neck showed right P2, left VA, bilateral ICA siphon severe stenosis.  EF 60 to 65%.  Negative for DVT, TEE negative.  LDL 117, A1c 5.9.  Discharged with DAPT and Lipitor 40 30-day CardioNet monitoring showed A. fib patient started on Eliquis. 12/2019  Admitted for right cerebellar infarct.  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  EF 60 to 65%, LDL 81, A1c 5.9.  Eliquis changed to Pradaxa, continue Lipitor 80.      Last visit 2/9/2022[name], NP), [name] returns for 52-month stroke follow-up.  She has been stable from a stroke standpoint since prior visit without new stroke/TIA symptoms and reports residual gait unsteadiness with imbalance and dysarthria. She continues to use rolling walker and denies any recent falls. She also reports chronic left leg and torso numbness with occasional burning sensation which is chronic and denies worsening. She has remained on Pradaxa and atorvastatin 80 mg daily without side effects.  Blood pressure today 147/86.  She has routinely followed with cardiology for continued dizziness which she reports is in relation to blood pressure and cardiology currently working on adjustments.  She was evaluated by Dr. Frances Furbish and was found to have severe sleep apnea. Recommended titration study but patient requested holding  off until further recovery from undergoing ablation for persistent A. 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 She is not ready to proceed with titration study. No further concerns at this time.    History provided for reference purposes only Hospital follow-up 02/03/2020 JM: She was discharged home from CIR on 01/22/2020 after an 8-day stay.  Since discharge, she reports residual " whooshy head" sensation, gait unsteadiness and mild dysarthria but reports ongoing improvement.  Recommended outpatient PT/OT/SLP at discharge from CIR but referral was not placed at discharge.  She has continued to do exercises at home as recommended during CIR stay.  Continues to use Rollator walker for ambulation and denies any recent falls.  Denies vertigo or dizziness sensation.  Denies new or worsening stroke/TIA symptoms.  Prior stroke deficit of left-sided paresthesias greatly improving and only experience occasional numbness/tingling.  Remains on Pradaxa without bleeding or bruising.  Remains on atorvastatin 80 mg daily without myalgias.  Blood pressure today 122/76.  Monitors at home which has been stable.  She does report urinary frequency over the past week and is concerned for possible UTI but has been increasing water intake and drinking cranberry juice.  Denies fever, dysuria or hematuria.  Typically, nocturia only on occasion but does report snoring, daytime naps and occasional insomnia.  She has not previously underwent sleep study.  No further concerns at this time.  Stroke admission 01/09/2020 Presented to ER with slurred speech, facial droop vertigo and ataxia while at her cardiologist office the morning of 01/09/2020.  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  Switched anticoagulation to Pradaxa.  HTN stable.  LDL 81 and increase atorvastatin from 40 mg to 80 mg daily.  Other stroke risk factors include advanced age and history of right MCA  stroke in 05/2018.  Evaluated by therapy with residual dysarthria, mild memory deficits, vestibular symptoms and imbalance affecting ADLs and mobility therefore discharged to CIR with functional decline.  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 Old infarct R frontoparietal cortex. Small vessel disease. ASPECTS 10.    CTA head & neck no LVO. B ICA bifurcation atherosclerosis, supraclinoid B ICA atherosclerosis. L VA origin moderate to severe stenosis. R P2 moderate stenosis.  MRI  R superior cerebellar territory infarct. Evolution of prior R MCA infarcts from last year. Small vessel disease.  2D Echo EF 60-65%. No source of embolus. LA severely dilated. LDL 81 HgbA1c 5.9 VTE prophylaxis - Lovenox 40 mg sq daily  Eliquis (apixaban) daily prior to admission, now on aspirin 81 mg daily.   Switched to Pradaxa Therapy recommendations:   CIR Disposition:   CIR       History provided for reference purposes only Update 08/01/2019 JM: Ms. Connick is a 83 year old female who is being seen today, 08/01/2019, for stroke follow-up with residual left-sided paresthesias and post stroke anxiety accompanied by her husband.  Initiated Cymbalta at prior visit due to ongoing paresthesias and severe anxiety but unfortunately caused hypertension therefore advised to discontinue. Paresthesias have been stable from prior visit without worsening with intermittent symptoms that occur randomly.  She does continue to have occasional balance difficulties and if ambulating or standing for too long she will start to feel "off".  Referral placed at prior visit to PT but apparently has not been called to schedule initial evaluation.  She continues to experience anxiety which has only been present for stroke but does feel slight improvement since prior visit.  Remains on Eliquis and atorvastatin without side effects.  Blood pressure today 132/74.  No further  concerns at this time.  Update 05/02/2019: Ms. Kahrs is a 83 year old female who is being seen today for stroke follow-up accompanied by her husband.  Residual deficits left-sided paresthesias consisting of numbness/tingling and occasional burning sensation.  She also endorses episodes of sensation of full body "intermittently shaking" and is usually worsened by stress, anxiety or fatigue.  Usually accompanied by nausea but denies headache or dizziness.  She becomes fearful during those times that she may be having a heart attack.  When questioned regarding anxiety, she does endorse minimal anxiety but after doing GAD-7 test, score of 20/21 showing severe anxiety.  She denies any prior history or family history of depression or anxiety.  She continues on Eliquis without bleeding or bruising.  She continues on atorvastatin without myalgias.  Blood pressure today 140/81.  She denies any additional episodes of dizziness and continues to follow with cardiology for blood pressure management.  She has recently decreased telmisartan as it is recommended to continue blood pressure between 130-150 due to  vertebral artery stenosis.  No further concerns at this time.   Update 11/19/2018 Dr. Pearlean Brownie: She is seen today for office follow-up visit following initial video follow-up visit on 08/13/2018.  She is accompanied by her husband.  She states she continues to have left leg paresthesias and numbness.  She has some mild gait and balance difficulties.  At times she noticed that the legs are trembling and she is initiating walk and she has to hold on something and the feeling goes away.  She has also been started on several new cardiac medications per her ablation for A. fib and feels medication may be the cause for her dizziness.  She is tolerating Eliquis well without bruising or bleeding.  Blood pressures well controlled and today it is 136/79.  She remains on Lipitor which is tolerating well without muscle aches and pains.   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  Initial video visit 08/13/2018 Dr. Pearlean Brownie : This is a initial video virtual consultation visit on Ms. Fico who was admitted to [facility] in February 2020 with a stroke.  I have personally obtained history of presenting illness from the patient and her husband and reviewed electronic medical records as well as imaging films in PACS.  She was admitted on 05/31/2018 with sudden onset of left arm and leg weakness and numbness as well as feeling dizzy and had one episode of emesis.  She had CT scan and CT angiogram in the ER which showed no large vessel occlusion she was given IV TPA and admitted to the neurological intensive care unit.  NIH stroke scale on admission was 4.  At baseline modified Rankin score was 0.  Patient had tight blood pressure control.  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  Transthoracic echo showed normal ejection fraction.  Transesophageal echocardiogram showed no cardiac source of embolism or PFO.  LDL cholesterol was elevated at 1 1 7  mg percent.  Hemoglobin A1c was 5.9.  Urine drug screen was negative.  Lower extremity venous Dopplers were negative for DVT.  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  Patient was started on aspirin 81 and Plavix 75 mg daily for 3 weeks and underwent an outpatient 30-day heart monitor which subsequently showed paroxysmal atrial fibrillation.  She has since then stopped aspirin and Plavix and has been switched to Eliquis.  Patient states she has done well since discharge.  She was initially transferred to inpatient rehab where she stayed for a few weeks  and did well.  She is still has left-sided numbness but feels some of the sensation may be coming back now.  She can walk independently without assistance though her husband stays close by.  She has had no falls or injuries.  She is tolerating Eliquis well without bleeding or bruising.  She is also tolerating Lipitor well.  She complains of little bit of jitteriness and tiredness but she blames this likely on her new A. fib medication.  She has no new complaints.  She has no prior history of strokes TIAs.        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     Social History:  Social History   Socioeconomic History   Marital status: Married    Spouse name: Interior and spatial designer   Number of children: Not on file   Years of education: 12+   Highest education level: Not on file  Occupational History    Comment: retired  Tobacco Use   Smoking status: Never   Smokeless tobacco: Never  Vaping Use   Vaping status: Never Used  Substance and Sexual Activity   Alcohol use: Never   Drug use: Never   Sexual activity: Not on file  Other Topics Concern   Not on file  Social History Narrative   Lives with husband   caffeine none daily   Right Handed   Social Drivers of Health   Financial Resource Strain: Low Risk  (05/27/2022)   Overall Financial Resource Strain (CARDIA)    Difficulty of Paying Living Expenses: Not hard at all  Food Insecurity: No Food Insecurity (01/24/2023)   Hunger Vital Sign    Worried About Running Out of Food in the Last Year: Never true    Ran Out of Food in the Last Year: Never true  Transportation Needs: No Transportation Needs (01/24/2023)   PRAPARE - Scientist, research (physical sciences) (Medical): No    Lack of Transportation (Non-Medical): No  Physical Activity: Insufficiently Active (05/27/2022)   Exercise Vital Sign    Days of Exercise per Week: 2 days    Minutes of Exercise per Session: 50 min  Stress: No Stress Concern Present (05/27/2022)   [employer] of Occupational Health - Occupational Stress Questionnaire    Feeling of Stress : Not at all  Social Connections: Socially Integrated (05/27/2022)   Social Connection and Isolation Panel [NHANES]    Frequency of Communication with Friends and Family: More than three times a week    Frequency of Social Gatherings with Friends and Family: More than three times a week    Attends Religious Services: More than 4 times per year    Active Member of [employer] or Organizations: Yes    Attends Engineer, structural: More than 4 times per year    Marital Status: Married  Catering manager Violence: Not At Risk (01/24/2023)   Humiliation, Afraid, Rape, and Kick questionnaire    Fear of Current or Ex-Partner: No    Emotionally Abused: No    Physically Abused: No    Sexually Abused: No    Medications:   Current Outpatient Medications on File Prior to Visit  Medication Sig Dispense Refill   amLODipine (NORVASC) 2.5 MG tablet Take 1 tablet (2.5 mg total) by mouth daily. 90 tablet 3   atorvastatin (LIPITOR) 80 MG tablet Take 1 tablet (80 mg total) by mouth daily.     dabigatran (PRADAXA) 150 MG CAPS capsule Take 1 capsule (150 mg total) by mouth every 12 (twelve) hours.     dorzolamide-timolol (COSOPT) 2-0.5 % ophthalmic solution Place 1 drop into both eyes 2 (two) times daily.     levETIRAcetam (KEPPRA) 500 MG tablet TAKE 1 TABLET BY MOUTH TWICE A DAY 180 tablet 3   losartan (COZAAR) 50 MG tablet Take 2 tablets (100 mg total) by mouth  daily.     metoprolol tartrate (LOPRESSOR) 25 MG tablet TAKE 1/2 TABLET BY MOUTH EVERY DAY AT BEDTIME 15 tablet 0   phenazopyridine (PYRIDIUM) 200 MG tablet Take 1 tablet (200  mg total) by mouth 3 (three) times daily as needed (urinary pain). 60 tablet 0   sulfamethoxazole-trimethoprim (BACTRIM DS) 800-160 MG tablet Take 1 tablet by mouth 2 (two) times daily. 20 tablet 0   Tafluprost, PF, 0.0015 % SOLN Apply to eye.     Current Facility-Administered Medications on File Prior to Visit  Medication Dose Route Frequency Provider Last Rate Last Admin   regadenoson (LEXISCAN) injection SOLN 0.4 mg  0.4 mg Intravenous Once Jodelle Red, MD       technetium tetrofosmin (TC-MYOVIEW) injection 32 millicurie  32 millicurie Intravenous Once PRN Jodelle Red, MD        Allergies:   Allergies  Allergen Reactions   Duloxetine     Causes Bp elevation.  Increased body jerks, nausea   Acetaminophen Hives and Rash    Other Reaction(s): Other (See Comments)  rash    Physical Exam  Today's Vitals   05/15/23 0929 05/15/23 0932  BP: (!) 140/90 (!) 140/83  Pulse: 71 73  Weight: 97 lb (44 kg)   Height: 5\' 5"  (1.651 m)    Body mass index is 16.14 kg/m.   General: frail cachectic looking pleasant elderly Caucasian lady seated, in no evident distress Head: head normocephalic and atraumatic.  Neck: supple with no carotid or supraclavicular bruits Cardiovascular: irregular rate and rhythm, no murmurs Musculoskeletal: no deformity Skin:  no rash/petichiae Vascular:  Normal pulses all extremities   Neurologic Exam Mental Status: Awake and fully alert.  Mild dysarthria with occasional speech hesitancy and word finding difficulty. Oriented to place and time. Recent and remote memory intact. Attention span, concentration and fund of knowledge appropriate. Mood and affect appropriate Cranial Nerves: Pupils equal, briskly reactive to light. Extraocular movements full without nystagmus. Visual fields full to confrontation. Hearing intact. Facial sensation intact. Face, tongue, palate moves normally and symmetrically.  Motor: Normal bulk and tone. Normal strength  in all tested extremity muscles except mild left hip flexor weakness and decreased left hand finger dexterity.  Fine action tremor of outstretched upper extremities left greater than right Sensory.:  Intact light touch, vibratory and pinprick sensation Coordination: Rapid alternating movements normal in all extremities except decreased left hand bilaterally. Finger-to-nose and heel-to-shin performed accurately bilaterally.  Mild action tremors bilateral upper extremities left greater than right.  No tremors at rest or evidence of cogwheel rigidity or bradykinesia Gait and Station: Arises from chair without difficulty. Stance is normal. Gait demonstrates normal stride length and slight unsteadiness/imbalance with use of Rollator walker. Reflexes: 1+ and symmetric. Toes downgoing.       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  Vascular risk factors of hypertension, hyperlipidemia, A. 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 Also recently diagnosed with  severe sleep apnea currently awaiting to undergo titration study.  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  Recent admission in October 2024 for secondary to embolic right MCA infarct from right M1 occlusion in the setting of acute COVID infection while taking Pradaxa not as prescribed for her  A-fib.  She underwent successful mechanical thrombectomy with complete revascularization of occluded right M1 and is doing quite reasonably well.    I had a long discussion with the patient and her husband regarding her recent admission for stroke and remote history of seizures and answered questions.  I  recommend she reduce the dose of Keppra to 250 mg in the morning and 500 mg  at night as it has been several years since her seizure.  Continue Pradaxa 150 mg twice daily for seizure prophylaxis for A-fib as well as maintain aggressive risk factor modification with strict control of hypertension with blood pressure goal belowbelow 130/90, lipids with LDL cholesterol goal below 70 mg percent and diabetes with hemoglobin A1c goal below 6.5%.  She was also counseled to use her CPAP every night for her sleep apnea.  She will follow-up with Dr. Frances Furbish for the same she was encouraged to use her wheeled walker at all times and we discussed fall safety precautions as well.  She will return for follow-up in the future in 6 months with Shanda Bumps nurse practitioner or call earlier if necessary I spent 45 minutes of face-to-face and non-face-to-face time with patient and husband discussing about her new onset seizures and prior history of strokes.  This included previsit chart review, lab review, study review, order entry, electronic health record documentation, patient education regarding prior stroke with residual deficits, importance of ongoing management of stroke risk factors as well as new diagnosis of sleep apnea and answered all other questions to patient and husband satisfaction   Delia Heady, MD  [facility] [address] [place], [place] [address]  Phone [phone] Fax [phone] Note: This document was prepared with digital dictation and possible smart phrase technology. Any transcriptional errors that result from this process are unintentional.

## 2023-05-15 NOTE — Patient Instructions (Addendum)
I had a long discussion with the patient and her husband regarding her recent admission for stroke and remote history of seizures and answered questions.  I recommend she reduce the dose of Keppra to 250 mg in the morning and 500 mg  at night as it has been several years since her seizure.  Continue Pradaxa 150 mg twice daily for seizure prophylaxis for A-fib as well as maintain aggressive risk factor modification with strict control of hypertension with blood pressure goal belowbelow 130/90, lipids with LDL cholesterol goal below 70 mg percent and diabetes with hemoglobin A1c goal below 6.5%.  She was also counseled to use her CPAP every night for her sleep apnea.  She will follow-up with Dr. Frances Furbish for the same she was encouraged to use her wheeled walker at all times and we discussed fall safety precautions as well.  She will return for follow-up in the future in 6 months with Shanda Bumps nurse practitioner or call earlier if necessary

## 2023-05-16 DIAGNOSIS — I69320 Aphasia following cerebral infarction: Secondary | ICD-10-CM | POA: Diagnosis not present

## 2023-05-16 DIAGNOSIS — M6281 Muscle weakness (generalized): Secondary | ICD-10-CM | POA: Diagnosis not present

## 2023-05-16 DIAGNOSIS — I69391 Dysphagia following cerebral infarction: Secondary | ICD-10-CM | POA: Diagnosis not present

## 2023-05-16 DIAGNOSIS — R2681 Unsteadiness on feet: Secondary | ICD-10-CM | POA: Diagnosis not present

## 2023-05-16 DIAGNOSIS — R278 Other lack of coordination: Secondary | ICD-10-CM | POA: Diagnosis not present

## 2023-05-17 DIAGNOSIS — R2681 Unsteadiness on feet: Secondary | ICD-10-CM | POA: Diagnosis not present

## 2023-05-17 DIAGNOSIS — M6281 Muscle weakness (generalized): Secondary | ICD-10-CM | POA: Diagnosis not present

## 2023-05-17 DIAGNOSIS — R278 Other lack of coordination: Secondary | ICD-10-CM | POA: Diagnosis not present

## 2023-05-17 DIAGNOSIS — I69391 Dysphagia following cerebral infarction: Secondary | ICD-10-CM | POA: Diagnosis not present

## 2023-05-17 DIAGNOSIS — I69320 Aphasia following cerebral infarction: Secondary | ICD-10-CM | POA: Diagnosis not present

## 2023-06-01 ENCOUNTER — Telehealth: Payer: Self-pay | Admitting: Neurology

## 2023-06-01 MED ORDER — LEVETIRACETAM 500 MG PO TABS: 500.0000 mg | ORAL_TABLET | Freq: Two times a day (BID) | ORAL | Status: DC

## 2023-06-01 NOTE — Telephone Encounter (Signed)
 Called the pt back. Previously she was on 1 tablet BID. (500 mg in the am and 500 mg in pm)  At the last visit was decreased to 1/2 tablet in am (250 mg) and 500 mg in evening. Pt states she has noticed that there is some kind or tremor and difference in face since reducing. She would like to go back to full tablet in am and pm. Advised I would note this and she can resume the medication as she was previously taking it. Informed that I would make Dr Rosemarie aware and for them to call if going back up on the dose she was on, if her symptoms continue. Pt verbalized understanding and was appreciative for the call back.

## 2023-06-01 NOTE — Telephone Encounter (Signed)
 Pt said face and round the mouth feels difference since Dr.Sethi decrease her levETIRAcetam  (KEPPRA ) 500 MG tablet. Would like call back to discuss if can go original dosage.

## 2023-06-02 ENCOUNTER — Other Ambulatory Visit: Payer: Self-pay | Admitting: Cardiovascular Disease

## 2023-06-02 ENCOUNTER — Telehealth: Payer: Self-pay

## 2023-06-02 DIAGNOSIS — E875 Hyperkalemia: Secondary | ICD-10-CM

## 2023-06-02 DIAGNOSIS — Z79899 Other long term (current) drug therapy: Secondary | ICD-10-CM

## 2023-06-02 DIAGNOSIS — I4819 Other persistent atrial fibrillation: Secondary | ICD-10-CM

## 2023-06-02 NOTE — Telephone Encounter (Signed)
 Spoke with pt and advised of lab results and recommendation as below per Dr Rodolfo Clan.  Pt verbalizes understanding and thanked Charity fundraiser for the call.  Order placed and released.

## 2023-06-02 NOTE — Telephone Encounter (Signed)
-----   Message from Richardo Chandler sent at 05/24/2023  4:34 PM EST ----- Please Inform Patient   Labs are normal x mild elevation in K Can we repeat please  Thanks

## 2023-06-05 ENCOUNTER — Ambulatory Visit: Payer: Medicare Other

## 2023-06-05 VITALS — Ht 65.0 in | Wt 98.0 lb

## 2023-06-05 DIAGNOSIS — M6281 Muscle weakness (generalized): Secondary | ICD-10-CM | POA: Diagnosis not present

## 2023-06-05 DIAGNOSIS — R2681 Unsteadiness on feet: Secondary | ICD-10-CM | POA: Diagnosis not present

## 2023-06-05 DIAGNOSIS — R278 Other lack of coordination: Secondary | ICD-10-CM | POA: Diagnosis not present

## 2023-06-05 DIAGNOSIS — Z0000 Encounter for general adult medical examination without abnormal findings: Secondary | ICD-10-CM

## 2023-06-05 DIAGNOSIS — I69320 Aphasia following cerebral infarction: Secondary | ICD-10-CM | POA: Diagnosis not present

## 2023-06-05 DIAGNOSIS — E875 Hyperkalemia: Secondary | ICD-10-CM | POA: Diagnosis not present

## 2023-06-05 DIAGNOSIS — I69391 Dysphagia following cerebral infarction: Secondary | ICD-10-CM | POA: Diagnosis not present

## 2023-06-05 DIAGNOSIS — Z79899 Other long term (current) drug therapy: Secondary | ICD-10-CM | POA: Diagnosis not present

## 2023-06-05 NOTE — Patient Instructions (Addendum)
 Mary Mercado , Thank you for taking time to come for your Medicare Wellness Visit. I appreciate your ongoing commitment to your health goals. Please review the following plan we discussed and let me know if I can assist you in the future.   Referrals/Orders/Follow-Ups/Clinician Recommendations:   This is a list of the screening recommended for you and due dates:  Health Maintenance  Topic Date Due   DTaP/Tdap/Td vaccine (1 - Tdap) Never done   Zoster (Shingles) Vaccine (1 of 2) Never done   DEXA scan (bone density measurement)  Never done   COVID-19 Vaccine (4 - 2024-25 season) 12/25/2022   Medicare Annual Wellness Visit  06/04/2024   Pneumonia Vaccine  Completed   Flu Shot  Completed   HPV Vaccine  Aged Out    Advanced directives: (In Chart) A copy of your advanced directives are scanned into your chart should your provider ever need it.  Next Medicare Annual Wellness Visit scheduled for next year: Yes

## 2023-06-05 NOTE — Progress Notes (Signed)
Subjective:   Mary Mercado is a 83 y.o. female who presents for Medicare Annual (Subsequent) preventive examination.  Visit Complete: Virtual I connected with  [employer] on 06/05/23 by a audio enabled telemedicine application and verified that I am speaking with the correct person using two identifiers.  Patient Location: Home  Provider Location: Home Office  I discussed the limitations of evaluation and management by telemedicine. The patient expressed understanding and agreed to proceed.  Vital Signs: Because this visit was a virtual/telehealth visit, some criteria may be missing or patient reported. Any vitals not documented were not able to be obtained and vitals that have been documented are patient reported.   Cardiac Risk Factors include: advanced age (>62men, >47 women);hypertension     Objective:    Today's Vitals   06/05/23 1535  Weight: 98 lb (44.5 kg)  Height: 5\' 5"  (1.651 m)   Body mass index is 16.31 kg/m.     06/05/2023    3:43 PM 01/30/2023    2:35 PM 01/24/2023   12:00 PM 05/27/2022    2:45 PM 05/26/2021    2:22 PM 08/18/2020    1:18 PM 03/30/2020   11:10 AM  Advanced Directives  Does Patient Have a Medical Advance Directive? Yes Yes Yes Yes Yes Yes Yes  Type of Estate agent of [place];Living will Living will Living will Healthcare Power of [place];Living will Healthcare Power of [place];Living will Healthcare Power of [place];Living will Healthcare Power of [place];Living will  Does patient want to make changes to medical advance directive? No - Patient declined No - Patient declined No - Patient declined No - Patient declined No - Patient declined  No - Patient declined  Copy of Healthcare Power of Attorney in Chart? Yes - validated most recent copy scanned in chart (See row information)   Yes - validated most recent copy scanned in chart (See row information) No - copy requested  No - copy requested     Current Medications (verified) Outpatient Encounter Medications as of 06/05/2023  Medication Sig   amLODipine (NORVASC) 2.5 MG tablet Take 1 tablet (2.5 mg total) by mouth daily.   atorvastatin (LIPITOR) 80 MG tablet Take 1 tablet (80 mg total) by mouth daily.   dabigatran (PRADAXA) 150 MG CAPS capsule Take 1 capsule (150 mg total) by mouth every 12 (twelve) hours.   dorzolamide-timolol (COSOPT) 2-0.5 % ophthalmic solution Place 1 drop into both eyes 2 (two) times daily.   levETIRAcetam (KEPPRA) 500 MG tablet Take 1 tablet (500 mg total) by mouth 2 (two) times daily.   losartan (COZAAR) 50 MG tablet Take 2 tablets (100 mg total) by mouth daily.   metoprolol tartrate (LOPRESSOR) 25 MG tablet TAKE 1/2 TABLET BY MOUTH EVERY DAY AT BEDTIME   phenazopyridine (PYRIDIUM) 200 MG tablet Take 1 tablet (200 mg total) by mouth 3 (three) times daily as needed (urinary pain).   sulfamethoxazole-trimethoprim (BACTRIM DS) 800-160 MG tablet Take 1 tablet by mouth 2 (two) times daily.   Tafluprost, PF, 0.0015 % SOLN Apply to eye.   Facility-Administered Encounter Medications as of 06/05/2023  Medication   regadenoson (LEXISCAN) injection SOLN 0.4 mg   technetium tetrofosmin (TC-MYOVIEW) injection 32 millicurie    Allergies (verified) Duloxetine and Acetaminophen   History: Past Medical History:  Diagnosis Date   Allergy    CVA (cerebral vascular accident) (HCC) 05/2018, 01/09/20   Hyperlipidemia LDL goal <70    Hypertension    Pacemaker    Palpitations  Vertigo, benign positional    Past Surgical History:  Procedure Laterality Date   AV NODE ABLATION N/A 03/30/2020   Procedure: AV NODE ABLATION;  Surgeon: Duke Salvia, MD;  Location: [facility] INVASIVE CV LAB;  Service: Cardiovascular;  Laterality: N/A;   AV NODE ABLATION N/A 03/30/2020   Procedure: AV NODE ABLATION;  Surgeon: Duke Salvia, MD;  Location: [facility] INVASIVE CV LAB;  Service: Cardiovascular;  Laterality: N/A;   CARDIOVERSION Left  10/31/2018   Procedure: CARDIOVERSION;  Surgeon: Elease Hashimoto Deloris Ping, MD;  Location: [facility] ENDOSCOPY;  Service: Cardiovascular;  Laterality: Left;   CARDIOVERSION N/A 01/08/2020   Procedure: CARDIOVERSION;  Surgeon: Parke Poisson, MD;  Location: [facility] ENDOSCOPY;  Service: Cardiovascular;  Laterality: N/A;   Hemilaminectomy and microdiskectomy at L4-5 on the left.  12/10/2003   IR CT HEAD LTD  01/24/2023   IR PERCUTANEOUS ART THROMBECTOMY/INFUSION INTRACRANIAL INC DIAG ANGIO  01/24/2023   IR US GUIDE VASC ACCESS RIGHT  01/24/2023   PACEMAKER IMPLANT N/A 03/30/2020   Procedure: PACEMAKER IMPLANT;  Surgeon: Duke Salvia, MD;  Location: [facility] INVASIVE CV LAB;  Service: Cardiovascular;  Laterality: N/A;   RADIOLOGY WITH ANESTHESIA N/A 01/24/2023   Procedure: IR WITH ANESTHESIA;  Surgeon: Radiologist, Medication, MD;  Location: [facility] OR;  Service: Radiology;  Laterality: N/A;   TEE WITHOUT CARDIOVERSION N/A 06/05/2018   Procedure: TRANSESOPHAGEAL ECHOCARDIOGRAM (TEE);  Surgeon: Lewayne Bunting, MD;  Location: [facility] ENDOSCOPY;  Service: Cardiovascular;  Laterality: N/A;  loop   Family History  Problem Relation Age of Onset   Diabetes Mother    Heart attack Mother        68   Colon cancer Father    Heart attack Brother 61   Sleep apnea Neg Hx    Social History   Socioeconomic History   Marital status: Married    Spouse name: [name]   Number of children: Not on file   Years of education: 12+   Highest education level: Not on file  Occupational History    Comment: retired  Tobacco Use   Smoking status: Never   Smokeless tobacco: Never  Vaping Use   Vaping status: Never Used  Substance and Sexual Activity   Alcohol use: Never   Drug use: Never   Sexual activity: Not on file  Other Topics Concern   Not on file  Social History Narrative   Lives with husband   caffeine none daily   Right Handed   Social Drivers of Health   Financial Resource Strain: Low Risk  (06/05/2023)   Overall Financial  Resource Strain (CARDIA)    Difficulty of Paying Living Expenses: Not hard at all  Food Insecurity: No Food Insecurity (06/05/2023)   Hunger Vital Sign    Worried About Running Out of Food in the Last Year: Never true    Ran Out of Food in the Last Year: Never true  Transportation Needs: No Transportation Needs (06/05/2023)   PRAPARE - Administrator, Civil Service (Medical): No    Lack of Transportation (Non-Medical): No  Physical Activity: Insufficiently Active (06/05/2023)   Exercise Vital Sign    Days of Exercise per Week: 3 days    Minutes of Exercise per Session: 30 min  Stress: No Stress Concern Present (06/05/2023)   [employer] of Occupational Health - Occupational Stress Questionnaire    Feeling of Stress : Not at all  Social Connections: Socially Integrated (06/05/2023)   Social Connection and Isolation Panel [NHANES]  Frequency of Communication with Friends and Family: More than three times a week    Frequency of Social Gatherings with Friends and Family: More than three times a week    Attends Religious Services: More than 4 times per year    Active Member of [employer] or Organizations: Yes    Attends Engineer, structural: More than 4 times per year    Marital Status: Married    Tobacco Counseling Counseling given: Not Answered   Clinical Intake:  Pre-visit preparation completed: Yes  Pain : No/denies pain     BMI - recorded: 16.31 Nutritional Status: BMI <19  Underweight Nutritional Risks: None Diabetes: No  How often do you need to have someone help you when you read instructions, pamphlets, or other written materials from your doctor or pharmacy?: 1 - Never  Interpreter Needed?: No  Information entered by :: Theresa Mulligan LPN   Activities of Daily Living    06/05/2023    3:41 PM 01/30/2023    2:35 PM  In your present state of health, do you have any difficulty performing the following activities:  Hearing? 0 0  Vision? 0 1   Difficulty concentrating or making decisions? 0 0  Walking or climbing stairs? 1   Comment Uses a Rollator Walker   Dressing or bathing? 0   Doing errands, shopping? 0 1  Preparing Food and eating ? N   Using the Toilet? N   In the past six months, have you accidently leaked urine? N   Do you have problems with loss of bowel control? N   Managing your Medications? N   Managing your Finances? N   Housekeeping or managing your Housekeeping? N     Patient Care Team: Nelwyn Salisbury, MD as PCP - General Nahser, Deloris Ping, MD as PCP - Cardiology (Cardiology)  Indicate any recent Medical Services you may have received from other than [facility] providers in the past year (date may be approximate).     Assessment:   This is a routine wellness examination for Damara.  Hearing/Vision screen Hearing Screening - Comments:: Denies hearing difficulties   Vision Screening - Comments:: Wears rx glasses - up to date with routine eye exams with  Dr Lottie Dawson   Goals Addressed               This Visit's Progress     Stay Active (pt-stated)         Depression Screen    06/05/2023    3:40 PM 02/20/2023   10:40 AM 02/13/2023   12:00 PM 05/27/2022    2:42 PM 03/07/2022    8:39 AM 01/13/2022    9:23 AM 05/26/2021    2:15 PM  PHQ 2/9 Scores  PHQ - 2 Score 0 0 1 0 1 0 0  PHQ- 9 Score  0 4  1 0     Fall Risk    06/05/2023    3:42 PM 02/20/2023   10:33 AM 02/13/2023   11:59 AM 05/27/2022    2:44 PM 03/07/2022    8:39 AM  Fall Risk   Falls in the past year? 0 0 0 0 0  Number falls in past yr: 0 0 0 0 0  Injury with Fall? 0 0 0 0 0  Risk for fall due to : No Fall Risks  History of fall(s);Impaired balance/gait No Fall Risks History of fall(s)  Follow up Falls prevention discussed;Falls evaluation completed  Falls evaluation completed Falls prevention discussed  Falls evaluation completed    MEDICARE RISK AT HOME: Medicare Risk at Home Any stairs in or around the home?: No If so, are there any  without handrails?: No Home free of loose throw rugs in walkways, pet beds, electrical cords, etc?: Yes Adequate lighting in your home to reduce risk of falls?: Yes Life alert?: No Use of a cane, walker or w/c?: Yes Grab bars in the bathroom?: Yes Shower chair or bench in shower?: Yes Elevated toilet seat or a handicapped toilet?: Yes  TIMED UP AND GO:  Was the test performed?  No    Cognitive Function:        06/05/2023    3:43 PM 05/27/2022    2:45 PM 05/26/2021    2:22 PM  6CIT Screen  What Year? 0 points 0 points 0 points  What month? 0 points 0 points 0 points  What time? 0 points 0 points 0 points  Count back from 20 0 points 0 points 0 points  Months in reverse 0 points 0 points 0 points  Repeat phrase 0 points 0 points 0 points  Total Score 0 points 0 points 0 points    Immunizations Immunization History  Administered Date(s) Administered   Fluad Quad(high Dose 65+) 02/11/2019, 02/05/2020, 03/04/2021, 03/07/2022   Fluad Trivalent(High Dose 65+) 01/25/2023   Influenza, High Dose Seasonal PF 06/19/2018   PFIZER(Purple Top)SARS-COV-2 Vaccination 05/17/2019, 06/07/2019, 01/12/2021   PPD Test 11/15/2019   Pneumococcal Conjugate-13 02/11/2019   Pneumococcal Polysaccharide-23 03/04/2021    TDAP status: Due, Education has been provided regarding the importance of this vaccine. Advised may receive this vaccine at local pharmacy or Health Dept. Aware to provide a copy of the vaccination record if obtained from local pharmacy or Health Dept. Verbalized acceptance and understanding.  Flu Vaccine status: Up to date  Pneumococcal vaccine status: Up to date  Covid-19 vaccine status: Declined, Education has been provided regarding the importance of this vaccine but patient still declined. Advised may receive this vaccine at local pharmacy or Health Dept.or vaccine clinic. Aware to provide a copy of the vaccination record if obtained from local pharmacy or Health Dept. Verbalized  acceptance and understanding.  Qualifies for Shingles Vaccine? Yes   Zostavax completed No   Shingrix Completed?: No.    Education has been provided regarding the importance of this vaccine. Patient has been advised to call insurance company to determine out of pocket expense if they have not yet received this vaccine. Advised may also receive vaccine at local pharmacy or Health Dept. Verbalized acceptance and understanding.  Screening Tests Health Maintenance  Topic Date Due   DTaP/Tdap/Td (1 - Tdap) Never done   Zoster Vaccines- Shingrix (1 of 2) Never done   DEXA SCAN  Never done   COVID-19 Vaccine (4 - 2024-25 season) 12/25/2022   Medicare Annual Wellness (AWV)  06/04/2024   Pneumonia Vaccine 19+ Years old  Completed   INFLUENZA VACCINE  Completed   HPV VACCINES  Aged Out    Health Maintenance  Health Maintenance Due  Topic Date Due   DTaP/Tdap/Td (1 - Tdap) Never done   Zoster Vaccines- Shingrix (1 of 2) Never done   DEXA SCAN  Never done   COVID-19 Vaccine (4 - 2024-25 season) 12/25/2022    Bone Density status: Ordered Deferred. Pt provided with contact info and advised to call to schedule appt.   Additional Screening:   Vision Screening: Recommended annual ophthalmology exams for early detection of glaucoma and other disorders of  the eye. Is the patient up to date with their annual eye exam?  Yes  Who is the provider or what is the name of the office in which the patient attends annual eye exams? Dr Lottie Dawson If pt is not established with a provider, would they like to be referred to a provider to establish care? No .   Dental Screening: Recommended annual dental exams for proper oral hygiene    Community Resource Referral / Chronic Care Management:  CRR required this visit?  No   CCM required this visit?  No     Plan:     I have personally reviewed and noted the following in the patient's chart:   Medical and social history Use of alcohol, tobacco or  illicit drugs  Current medications and supplements including opioid prescriptions. Patient is not currently taking opioid prescriptions. Functional ability and status Nutritional status Physical activity Advanced directives List of other physicians Hospitalizations, surgeries, and ER visits in previous 12 months Vitals Screenings to include cognitive, depression, and falls Referrals and appointments  In addition, I have reviewed and discussed with patient certain preventive protocols, quality metrics, and best practice recommendations. A written personalized care plan for preventive services as well as general preventive health recommendations were provided to patient.     Tillie Rung, LPN   1/61/0960   After Visit Summary: (MyChart) Due to this being a telephonic visit, the after visit summary with patients personalized plan was offered to patient via MyChart   Nurse Notes: None

## 2023-06-06 DIAGNOSIS — M6281 Muscle weakness (generalized): Secondary | ICD-10-CM | POA: Diagnosis not present

## 2023-06-06 DIAGNOSIS — I69320 Aphasia following cerebral infarction: Secondary | ICD-10-CM | POA: Diagnosis not present

## 2023-06-06 DIAGNOSIS — D0462 Carcinoma in situ of skin of left upper limb, including shoulder: Secondary | ICD-10-CM | POA: Diagnosis not present

## 2023-06-06 DIAGNOSIS — R278 Other lack of coordination: Secondary | ICD-10-CM | POA: Diagnosis not present

## 2023-06-06 DIAGNOSIS — R2681 Unsteadiness on feet: Secondary | ICD-10-CM | POA: Diagnosis not present

## 2023-06-06 DIAGNOSIS — L570 Actinic keratosis: Secondary | ICD-10-CM | POA: Diagnosis not present

## 2023-06-06 DIAGNOSIS — I69391 Dysphagia following cerebral infarction: Secondary | ICD-10-CM | POA: Diagnosis not present

## 2023-06-06 LAB — BASIC METABOLIC PANEL
BUN/Creatinine Ratio: 21 (ref 12–28)
BUN: 15 mg/dL (ref 8–27)
CO2: 23 mmol/L (ref 20–29)
Calcium: 9.2 mg/dL (ref 8.7–10.3)
Chloride: 101 mmol/L (ref 96–106)
Creatinine, Ser: 0.72 mg/dL (ref 0.57–1.00)
Glucose: 116 mg/dL — ABNORMAL HIGH (ref 70–99)
Potassium: 4.6 mmol/L (ref 3.5–5.2)
Sodium: 139 mmol/L (ref 134–144)
eGFR: 82 mL/min/{1.73_m2} (ref >59)

## 2023-06-07 DIAGNOSIS — M6281 Muscle weakness (generalized): Secondary | ICD-10-CM | POA: Diagnosis not present

## 2023-06-07 DIAGNOSIS — R278 Other lack of coordination: Secondary | ICD-10-CM | POA: Diagnosis not present

## 2023-06-07 DIAGNOSIS — I69320 Aphasia following cerebral infarction: Secondary | ICD-10-CM | POA: Diagnosis not present

## 2023-06-07 DIAGNOSIS — I69391 Dysphagia following cerebral infarction: Secondary | ICD-10-CM | POA: Diagnosis not present

## 2023-06-07 DIAGNOSIS — R2681 Unsteadiness on feet: Secondary | ICD-10-CM | POA: Diagnosis not present

## 2023-06-09 DIAGNOSIS — I69320 Aphasia following cerebral infarction: Secondary | ICD-10-CM | POA: Diagnosis not present

## 2023-06-09 DIAGNOSIS — M6281 Muscle weakness (generalized): Secondary | ICD-10-CM | POA: Diagnosis not present

## 2023-06-09 DIAGNOSIS — R278 Other lack of coordination: Secondary | ICD-10-CM | POA: Diagnosis not present

## 2023-06-09 DIAGNOSIS — I69391 Dysphagia following cerebral infarction: Secondary | ICD-10-CM | POA: Diagnosis not present

## 2023-06-09 DIAGNOSIS — R2681 Unsteadiness on feet: Secondary | ICD-10-CM | POA: Diagnosis not present

## 2023-06-13 DIAGNOSIS — I69391 Dysphagia following cerebral infarction: Secondary | ICD-10-CM | POA: Diagnosis not present

## 2023-06-13 DIAGNOSIS — R278 Other lack of coordination: Secondary | ICD-10-CM | POA: Diagnosis not present

## 2023-06-13 DIAGNOSIS — R2681 Unsteadiness on feet: Secondary | ICD-10-CM | POA: Diagnosis not present

## 2023-06-13 DIAGNOSIS — M6281 Muscle weakness (generalized): Secondary | ICD-10-CM | POA: Diagnosis not present

## 2023-06-13 DIAGNOSIS — I69320 Aphasia following cerebral infarction: Secondary | ICD-10-CM | POA: Diagnosis not present

## 2023-06-14 DIAGNOSIS — M6281 Muscle weakness (generalized): Secondary | ICD-10-CM | POA: Diagnosis not present

## 2023-06-14 DIAGNOSIS — I69320 Aphasia following cerebral infarction: Secondary | ICD-10-CM | POA: Diagnosis not present

## 2023-06-14 DIAGNOSIS — R278 Other lack of coordination: Secondary | ICD-10-CM | POA: Diagnosis not present

## 2023-06-14 DIAGNOSIS — R2681 Unsteadiness on feet: Secondary | ICD-10-CM | POA: Diagnosis not present

## 2023-06-14 DIAGNOSIS — I69391 Dysphagia following cerebral infarction: Secondary | ICD-10-CM | POA: Diagnosis not present

## 2023-06-16 DIAGNOSIS — M6281 Muscle weakness (generalized): Secondary | ICD-10-CM | POA: Diagnosis not present

## 2023-06-16 DIAGNOSIS — I69320 Aphasia following cerebral infarction: Secondary | ICD-10-CM | POA: Diagnosis not present

## 2023-06-16 DIAGNOSIS — I69391 Dysphagia following cerebral infarction: Secondary | ICD-10-CM | POA: Diagnosis not present

## 2023-06-16 DIAGNOSIS — R2681 Unsteadiness on feet: Secondary | ICD-10-CM | POA: Diagnosis not present

## 2023-06-16 DIAGNOSIS — R278 Other lack of coordination: Secondary | ICD-10-CM | POA: Diagnosis not present

## 2023-06-19 DIAGNOSIS — I69320 Aphasia following cerebral infarction: Secondary | ICD-10-CM | POA: Diagnosis not present

## 2023-06-19 DIAGNOSIS — R2681 Unsteadiness on feet: Secondary | ICD-10-CM | POA: Diagnosis not present

## 2023-06-19 DIAGNOSIS — M6281 Muscle weakness (generalized): Secondary | ICD-10-CM | POA: Diagnosis not present

## 2023-06-19 DIAGNOSIS — I69391 Dysphagia following cerebral infarction: Secondary | ICD-10-CM | POA: Diagnosis not present

## 2023-06-19 DIAGNOSIS — R278 Other lack of coordination: Secondary | ICD-10-CM | POA: Diagnosis not present

## 2023-06-20 DIAGNOSIS — R278 Other lack of coordination: Secondary | ICD-10-CM | POA: Diagnosis not present

## 2023-06-20 DIAGNOSIS — M6281 Muscle weakness (generalized): Secondary | ICD-10-CM | POA: Diagnosis not present

## 2023-06-20 DIAGNOSIS — R2681 Unsteadiness on feet: Secondary | ICD-10-CM | POA: Diagnosis not present

## 2023-06-20 DIAGNOSIS — I69320 Aphasia following cerebral infarction: Secondary | ICD-10-CM | POA: Diagnosis not present

## 2023-06-20 DIAGNOSIS — I69391 Dysphagia following cerebral infarction: Secondary | ICD-10-CM | POA: Diagnosis not present

## 2023-06-21 ENCOUNTER — Other Ambulatory Visit: Payer: Self-pay | Admitting: Family Medicine

## 2023-06-21 DIAGNOSIS — M6281 Muscle weakness (generalized): Secondary | ICD-10-CM | POA: Diagnosis not present

## 2023-06-21 DIAGNOSIS — I69320 Aphasia following cerebral infarction: Secondary | ICD-10-CM | POA: Diagnosis not present

## 2023-06-21 DIAGNOSIS — R2681 Unsteadiness on feet: Secondary | ICD-10-CM | POA: Diagnosis not present

## 2023-06-21 DIAGNOSIS — R278 Other lack of coordination: Secondary | ICD-10-CM | POA: Diagnosis not present

## 2023-06-21 DIAGNOSIS — I69391 Dysphagia following cerebral infarction: Secondary | ICD-10-CM | POA: Diagnosis not present

## 2023-06-23 DIAGNOSIS — R278 Other lack of coordination: Secondary | ICD-10-CM | POA: Diagnosis not present

## 2023-06-23 DIAGNOSIS — I69391 Dysphagia following cerebral infarction: Secondary | ICD-10-CM | POA: Diagnosis not present

## 2023-06-23 DIAGNOSIS — M6281 Muscle weakness (generalized): Secondary | ICD-10-CM | POA: Diagnosis not present

## 2023-06-23 DIAGNOSIS — R2681 Unsteadiness on feet: Secondary | ICD-10-CM | POA: Diagnosis not present

## 2023-06-23 DIAGNOSIS — I69320 Aphasia following cerebral infarction: Secondary | ICD-10-CM | POA: Diagnosis not present

## 2023-06-28 ENCOUNTER — Other Ambulatory Visit: Payer: Self-pay | Admitting: Family Medicine

## 2023-06-28 ENCOUNTER — Other Ambulatory Visit: Payer: Self-pay | Admitting: Cardiovascular Disease

## 2023-06-28 DIAGNOSIS — I4819 Other persistent atrial fibrillation: Secondary | ICD-10-CM

## 2023-06-29 ENCOUNTER — Ambulatory Visit: Payer: Medicare Other

## 2023-06-29 DIAGNOSIS — I4430 Unspecified atrioventricular block: Secondary | ICD-10-CM | POA: Diagnosis not present

## 2023-07-01 LAB — CUP PACEART REMOTE DEVICE CHECK
Battery Remaining Longevity: 114 mo
Battery Voltage: 3 V
Brady Statistic AP VP Percent: 0 %
Brady Statistic AP VS Percent: 0 %
Brady Statistic AS VP Percent: 99.66 %
Brady Statistic AS VS Percent: 0.34 %
Brady Statistic RA Percent Paced: 0 %
Brady Statistic RV Percent Paced: 99.66 %
Date Time Interrogation Session: 20250305234811
Implantable Lead Connection Status: 753985
Implantable Lead Connection Status: 753985
Implantable Lead Implant Date: 20211206
Implantable Lead Implant Date: 20211206
Implantable Lead Location: 753858
Implantable Lead Location: 753860
Implantable Lead Model: 3830
Implantable Lead Model: 5076
Implantable Pulse Generator Implant Date: 20211206
Lead Channel Impedance Value: 323 Ohm
Lead Channel Impedance Value: 361 Ohm
Lead Channel Impedance Value: 399 Ohm
Lead Channel Impedance Value: 494 Ohm
Lead Channel Pacing Threshold Amplitude: 0.875 V
Lead Channel Pacing Threshold Pulse Width: 0.4 ms
Lead Channel Sensing Intrinsic Amplitude: 6.5 mV
Lead Channel Sensing Intrinsic Amplitude: 6.5 mV
Lead Channel Sensing Intrinsic Amplitude: 8.625 mV
Lead Channel Sensing Intrinsic Amplitude: 8.625 mV
Lead Channel Setting Pacing Amplitude: 2 V
Lead Channel Setting Pacing Pulse Width: 0.4 ms
Lead Channel Setting Sensing Sensitivity: 2 mV
Zone Setting Status: 755011

## 2023-07-03 ENCOUNTER — Telehealth: Payer: Self-pay | Admitting: Family Medicine

## 2023-07-03 NOTE — Telephone Encounter (Signed)
 Pt husband call and stated he want to know can she come in and do a urine sample before her appt he husband stated it is hard for her to give a sample and want you to call him back.

## 2023-07-03 NOTE — Telephone Encounter (Signed)
 Spoke with pt husband advised to come by the office and pick up a urine cup and collect pt urine and then drop it off at the office so we can test urine. He will pick up tomorrow morning

## 2023-07-04 DIAGNOSIS — R278 Other lack of coordination: Secondary | ICD-10-CM | POA: Diagnosis not present

## 2023-07-04 DIAGNOSIS — R2681 Unsteadiness on feet: Secondary | ICD-10-CM | POA: Diagnosis not present

## 2023-07-04 DIAGNOSIS — M6281 Muscle weakness (generalized): Secondary | ICD-10-CM | POA: Diagnosis not present

## 2023-07-05 DIAGNOSIS — R278 Other lack of coordination: Secondary | ICD-10-CM | POA: Diagnosis not present

## 2023-07-05 DIAGNOSIS — M6281 Muscle weakness (generalized): Secondary | ICD-10-CM | POA: Diagnosis not present

## 2023-07-05 DIAGNOSIS — R2681 Unsteadiness on feet: Secondary | ICD-10-CM | POA: Diagnosis not present

## 2023-07-06 ENCOUNTER — Other Ambulatory Visit (INDEPENDENT_AMBULATORY_CARE_PROVIDER_SITE_OTHER)

## 2023-07-06 DIAGNOSIS — R300 Dysuria: Secondary | ICD-10-CM | POA: Diagnosis not present

## 2023-07-06 LAB — POCT URINALYSIS DIPSTICK
Bilirubin, UA: NEGATIVE
Blood, UA: NEGATIVE
Glucose, UA: NEGATIVE
Ketones, UA: NEGATIVE
Leukocytes, UA: NEGATIVE
Nitrite, UA: NEGATIVE
Protein, UA: NEGATIVE
Spec Grav, UA: 1.015 (ref 1.010–1.025)
Urobilinogen, UA: 0.2 U/dL
pH, UA: 6 (ref 5.0–8.0)

## 2023-07-06 NOTE — Progress Notes (Unsigned)
 Pt came in to drop off urine. Okay per Dr.Fry. pt urine was negative for nitrates and Leukocytes. No culture was sent in due to results.

## 2023-07-07 DIAGNOSIS — R2681 Unsteadiness on feet: Secondary | ICD-10-CM | POA: Diagnosis not present

## 2023-07-07 DIAGNOSIS — M6281 Muscle weakness (generalized): Secondary | ICD-10-CM | POA: Diagnosis not present

## 2023-07-07 DIAGNOSIS — R278 Other lack of coordination: Secondary | ICD-10-CM | POA: Diagnosis not present

## 2023-07-07 NOTE — Telephone Encounter (Signed)
 Pt spouse dropped off urine on 07/06/23, urine was tested and results were negative, reviewed results with pt spouse verbalized understanding. Pt has a f/u appointment with Dr Clent Ridges on 07/10/23

## 2023-07-08 ENCOUNTER — Other Ambulatory Visit: Payer: Self-pay | Admitting: Internal Medicine

## 2023-07-08 DIAGNOSIS — I4821 Permanent atrial fibrillation: Secondary | ICD-10-CM

## 2023-07-08 DIAGNOSIS — I480 Paroxysmal atrial fibrillation: Secondary | ICD-10-CM

## 2023-07-10 ENCOUNTER — Ambulatory Visit (INDEPENDENT_AMBULATORY_CARE_PROVIDER_SITE_OTHER): Admitting: Family Medicine

## 2023-07-10 ENCOUNTER — Encounter: Payer: Self-pay | Admitting: Family Medicine

## 2023-07-10 VITALS — BP 100/80 | HR 71 | Temp 97.7°F | Ht 65.0 in | Wt 103.0 lb

## 2023-07-10 DIAGNOSIS — R319 Hematuria, unspecified: Secondary | ICD-10-CM

## 2023-07-10 NOTE — Progress Notes (Signed)
   Subjective:    Patient ID: Mary Mercado, female    DOB: Jul 18, 1940, 83 y.o.   MRN: [id number]  HPI Here to discuss her concerns about her urine. She has had hematuria off and on for the past year or so, and she has had severla UTI's. We treated her for a UTI several weeks ago, and a follow up UA was clear. She still has some posturination discomfort in the pubic area at times.    Review of Systems  Constitutional: Negative.   Respiratory: Negative.    Cardiovascular: Negative.   Gastrointestinal: Negative.   Genitourinary:  Positive for hematuria. Negative for dysuria and frequency.       Objective:   Physical Exam Constitutional:      Appearance: Normal appearance.  Cardiovascular:     Rate and Rhythm: Normal rate and regular rhythm.     Pulses: Normal pulses.     Heart sounds: Normal heart sounds.  Pulmonary:     Effort: Pulmonary effort is normal.     Breath sounds: Normal breath sounds.  Abdominal:     General: Abdomen is flat. Bowel sounds are normal. There is no distension.     Palpations: Abdomen is soft. There is no mass.     Tenderness: There is no rebound.     Hernia: No hernia is present.     Comments: Mildly tender above the pubis   Neurological:     Mental Status: She is alert.           Assessment & Plan:  Recurrent hematuria. We will refer her to Urology to evaluate.  Gershon Crane, MD

## 2023-07-11 DIAGNOSIS — R278 Other lack of coordination: Secondary | ICD-10-CM | POA: Diagnosis not present

## 2023-07-11 DIAGNOSIS — R2681 Unsteadiness on feet: Secondary | ICD-10-CM | POA: Diagnosis not present

## 2023-07-11 DIAGNOSIS — M6281 Muscle weakness (generalized): Secondary | ICD-10-CM | POA: Diagnosis not present

## 2023-07-12 DIAGNOSIS — R278 Other lack of coordination: Secondary | ICD-10-CM | POA: Diagnosis not present

## 2023-07-12 DIAGNOSIS — M6281 Muscle weakness (generalized): Secondary | ICD-10-CM | POA: Diagnosis not present

## 2023-07-12 DIAGNOSIS — R2681 Unsteadiness on feet: Secondary | ICD-10-CM | POA: Diagnosis not present

## 2023-07-14 DIAGNOSIS — M6281 Muscle weakness (generalized): Secondary | ICD-10-CM | POA: Diagnosis not present

## 2023-07-14 DIAGNOSIS — R278 Other lack of coordination: Secondary | ICD-10-CM | POA: Diagnosis not present

## 2023-07-14 DIAGNOSIS — R2681 Unsteadiness on feet: Secondary | ICD-10-CM | POA: Diagnosis not present

## 2023-07-19 DIAGNOSIS — H401134 Primary open-angle glaucoma, bilateral, indeterminate stage: Secondary | ICD-10-CM | POA: Diagnosis not present

## 2023-07-24 ENCOUNTER — Telehealth: Payer: Self-pay | Admitting: Internal Medicine

## 2023-07-24 NOTE — Telephone Encounter (Signed)
 Pt c/o medication issue:  1. Name of Medication: PRADAXA 150 MG CAPS capsule   2. How are you currently taking this medication (dosage and times per day)? As written  3. Are you having a reaction (difficulty breathing--STAT)? No  4. What is your medication issue? Pt would like a letter to be sent to [facility] in regards to this medication. Please advise

## 2023-07-24 NOTE — Telephone Encounter (Signed)
 Spoke with pt's husband, DPR who states he received a letter from [facility] stating it was time to request an exception for brand name Pradaxa.  Pt's husband advised to bring a copy of the letter to the office for review.  Pt's husband verbalizes understanding and thanked Charity fundraiser for the call back.

## 2023-07-28 ENCOUNTER — Other Ambulatory Visit (HOSPITAL_COMMUNITY): Payer: Self-pay

## 2023-07-28 ENCOUNTER — Telehealth: Payer: Self-pay | Admitting: Pharmacy Technician

## 2023-07-28 NOTE — Telephone Encounter (Signed)
 Pharmacy Patient Advocate Encounter   Received notification from Onbase that prior authorization for pradaxa is required/requested.   Insurance verification completed.   The patient is insured through [facility] .   Per test claim: PA required; PA submitted to above mentioned insurance via CoverMyMeds Key/confirmation #/EOC [id number] Status is pending

## 2023-07-31 NOTE — Telephone Encounter (Signed)
 This one cam back as a duplicate so then did another form under [id number]

## 2023-07-31 NOTE — Telephone Encounter (Signed)
 We got an approval back but it wasn't clear if it was for the drug itself or for a tier exception. I called the pharmacy and they said she paid 90 something last fill. They tried to run it again today but was coming up too soon. The approval is scanned in media

## 2023-08-01 DIAGNOSIS — L570 Actinic keratosis: Secondary | ICD-10-CM | POA: Diagnosis not present

## 2023-08-01 DIAGNOSIS — D0462 Carcinoma in situ of skin of left upper limb, including shoulder: Secondary | ICD-10-CM | POA: Diagnosis not present

## 2023-08-02 NOTE — Telephone Encounter (Signed)
 Pharmacy Patient Advocate Encounter  Received notification from [facility] that Prior Authorization for pradaxa has been APPROVED from 07/28/23 to 07/27/24

## 2023-08-04 NOTE — Addendum Note (Signed)
 Addended by: Elease Etienne A on: 08/04/2023 01:48 PM   Modules accepted: Orders

## 2023-08-04 NOTE — Progress Notes (Signed)
 Remote pacemaker transmission.

## 2023-08-07 ENCOUNTER — Encounter: Payer: Self-pay | Admitting: Internal Medicine

## 2023-08-09 DIAGNOSIS — R310 Gross hematuria: Secondary | ICD-10-CM | POA: Diagnosis not present

## 2023-08-09 DIAGNOSIS — R350 Frequency of micturition: Secondary | ICD-10-CM | POA: Diagnosis not present

## 2023-08-10 DIAGNOSIS — R310 Gross hematuria: Secondary | ICD-10-CM | POA: Diagnosis not present

## 2023-08-10 DIAGNOSIS — N200 Calculus of kidney: Secondary | ICD-10-CM | POA: Diagnosis not present

## 2023-08-10 DIAGNOSIS — R935 Abnormal findings on diagnostic imaging of other abdominal regions, including retroperitoneum: Secondary | ICD-10-CM | POA: Diagnosis not present

## 2023-08-10 NOTE — Telephone Encounter (Signed)
 PA has been approved through 07/2024.

## 2023-08-29 ENCOUNTER — Ambulatory Visit: Payer: Medicare Other | Admitting: Cardiovascular Disease

## 2023-09-01 ENCOUNTER — Ambulatory Visit: Payer: Medicare Other | Admitting: Cardiovascular Disease

## 2023-09-03 NOTE — Progress Notes (Unsigned)
 Cardiology Office Note:    Date:  09/04/2023   ID:  [facility] Nery, Muzny [birth year], MRN [id number]  PCP:  Donley Furth, MD  Cardiologist:  Ahmad Alert, MD  Electrophysiologist:  None   Referring MD: Donley Furth, MD    Problem List 1. Atrial Fib:    CHADS2VASC =  5   ( female, age 83, CVA Chief Complaint  Patient presents with   Atrial Fibrillation    Aug. 7, 2020     Mary Mercado is a 83 y.o. female with a hx of rapid atrial fib.    We cardioverted her on July 7. Is doing great.   Is having some dizziness.  Constant.  Not related to orthostatic.  Is not dizzy when she is lying down      Thinks its due to the metoprolol    Nov. 9, 2020  Mary Mercado is seen today for follow up of her atrial fib Doing well No cp Breathing is ok Has some left lower rib soreness.  Has lots of dizziness.   Especially after starting the telmisartan  .    Reviewed her BP log  No readings below 100 .   Most blood pressure readings since starting the telmisartan  are in the normal range.  Her blood pressure was mildly elevated before starting telmisartan .  August 09, 2019: Mary Mercado is seen today for follow-up of her atrial fibrillation.  She had a successful cardioversion July, 2020.  She remains in normal sinus rhythm. Dizziness has improved.   Having back pain - is on ultram    Has the sensation of quivering inside - she does not think its a HR irregularity   Sept. 9, 2021:   Seen for follow up . Has PAF - is in atrial fib today  Has had palpitations for the past several weeks.  Has been on eliquis  without interruption.  Tried some Dilt 30 mg tabks Having some chest twinges  -  Oct. 15, 2021: Pt has hx of persistent AF. Was cardioverted Sept. 15. Was admitted with CVA on Sept. 16.  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 She had some tachycardia on Oct. 9.  Called the weekend on call PA .  Hao Meng, [place] recommended additional Diltiazem . ( she would later be diagnosed with a UTI which may have contributed to her tachycardia )  Eliquis  had been switched to Pradaxa .  She saw her Primary MD ( Dr. Alyne Babinski) on Oct. 13.  He increased the Diltiazem  to 180 mg a day   We discussed afib clinic to consider alternatives antiarrhythmics and also to consider Afib ablation .  We discussed doing a TEE prior to any other cardioversion attempts.   Sep 02, 2020  Has persistent Afib   Had a cardioversion - complicated by a CVA the following day  Has had AV node ablation with pacer placement  Had a seizure on March .  Is now on Keppra     Sep 21, 2021: Mary Mercado seen today follow-up of her persistent atrial fibrillation.  She has a ventricular pacer following AV node ablation.  Brought a list of questions / concerns     Sep 04, 2023 Mary Mercado is seen of  follow up of her CVA, atrial  fib  S/p AV node ablation Is sore around her device ,  she has lost so much weight that her pacer protrudes quite a bit   Has had several strokes   Legs are swolled.   Possibly due to inactivity and her amlodipine  therapy .  Will DC metoprolol  , DC amlodipine   Start bisoprolol 5 mg   Past Medical History:  Diagnosis Date   Allergy    CVA (cerebral vascular accident) (HCC) 05/2018, 01/09/20   Hyperlipidemia LDL goal <70    Hypertension    Pacemaker    Palpitations    Vertigo, benign positional     Past Surgical History:  Procedure Laterality Date   AV NODE ABLATION N/A 03/30/2020   Procedure: AV NODE ABLATION;  Surgeon: Verona Goodwill, MD;  Location: [facility] INVASIVE CV LAB;  Service: Cardiovascular;  Laterality: N/A;   AV NODE ABLATION N/A 03/30/2020   Procedure: AV NODE ABLATION;  Surgeon: Verona Goodwill, MD;  Location: [facility]  INVASIVE CV LAB;  Service: Cardiovascular;  Laterality: N/A;   CARDIOVERSION Left 10/31/2018   Procedure: CARDIOVERSION;  Surgeon: Alroy Aspen Lela Purple, MD;  Location: [facility] ENDOSCOPY;  Service: Cardiovascular;  Laterality: Left;   CARDIOVERSION N/A 01/08/2020   Procedure: CARDIOVERSION;  Surgeon: Euell Herrlich, MD;  Location: [facility] ENDOSCOPY;  Service: Cardiovascular;  Laterality: N/A;   Hemilaminectomy and microdiskectomy at L4-5 on the left.  12/10/2003   IR CT HEAD LTD  01/24/2023   IR PERCUTANEOUS ART THROMBECTOMY/INFUSION INTRACRANIAL INC DIAG ANGIO  01/24/2023   IR US  GUIDE VASC ACCESS RIGHT  01/24/2023   PACEMAKER IMPLANT N/A 03/30/2020   Procedure: PACEMAKER IMPLANT;  Surgeon: Verona Goodwill, MD;  Location: [facility] INVASIVE CV LAB;  Service: Cardiovascular;  Laterality: N/A;   RADIOLOGY WITH ANESTHESIA N/A 01/24/2023   Procedure: IR WITH ANESTHESIA;  Surgeon: Radiologist, Medication, MD;  Location: [facility] OR;  Service: Radiology;  Laterality: N/A;   TEE WITHOUT CARDIOVERSION N/A 06/05/2018   Procedure: TRANSESOPHAGEAL ECHOCARDIOGRAM (TEE);  Surgeon: Lenise Quince, MD;  Location: [facility] ENDOSCOPY;  Service: Cardiovascular;  Laterality: N/A;  loop    Current Medications: Current Meds  Medication Sig   atorvastatin  (LIPITOR ) 80 MG tablet Take 1 tablet (80 mg total) by mouth daily.   bisoprolol (ZEBETA) 5 MG tablet Take 1 tablet (5 mg total) by mouth daily.   dorzolamide -timolol  (COSOPT ) 2-0.5 % ophthalmic solution Place 1 drop into both eyes 2 (two) times daily.   levETIRAcetam  (KEPPRA ) 500 MG tablet Take 1 tablet (500 mg total) by mouth 2 (two) times daily.   losartan  (COZAAR ) 50 MG tablet TAKE 2 TABLETS BY MOUTH EVERY DAY   phenazopyridine  (PYRIDIUM ) 200 MG tablet Take 1 tablet (200 mg total) by mouth 3 (three) times daily as needed (urinary pain).   PRADAXA  150 MG CAPS capsule TAKE 1 CAPSULE BY MOUTH TWICE A DAY   Tafluprost, PF, 0.0015 % SOLN Apply to eye.   [DISCONTINUED] amLODipine  (NORVASC ) 2.5 MG  tablet Take 1 tablet (2.5 mg total) by mouth daily.   [DISCONTINUED] metoprolol  tartrate (LOPRESSOR ) 25 MG tablet TAKE 1/2 TABLET BY MOUTH EVERY DAY AT BEDTIME     Allergies:   Duloxetine  and Acetaminophen    Social History   Socioeconomic History   Marital status: Married    Spouse name: Allayne Arabian   Number of children: Not on file   Years of education: 12+   Highest education level: Not on file  Occupational History    Comment: retired  Tobacco Use  Smoking status: Never   Smokeless tobacco: Never  Vaping Use   Vaping status: Never Used  Substance and Sexual Activity   Alcohol use: Never   Drug use: Never   Sexual activity: Not on file  Other Topics Concern   Not on file  Social History Narrative   Lives with husband   caffeine none daily   Right Handed   Social Drivers of Health   Financial Resource Strain: Low Risk  (06/05/2023)   Overall Financial Resource Strain (CARDIA)    Difficulty of Paying Living Expenses: Not hard at all  Food Insecurity: No Food Insecurity (06/05/2023)   Hunger Vital Sign    Worried About Running Out of Food in the Last Year: Never true    Ran Out of Food in the Last Year: Never true  Transportation Needs: No Transportation Needs (06/05/2023)   PRAPARE - Administrator, Civil Service (Medical): No    Lack of Transportation (Non-Medical): No  Physical Activity: Insufficiently Active (06/05/2023)   Exercise Vital Sign    Days of Exercise per Week: 3 days    Minutes of Exercise per Session: 30 min  Stress: No Stress Concern Present (06/05/2023)   [employer] of Occupational Health - Occupational Stress Questionnaire    Feeling of Stress : Not at all  Social Connections: Socially Integrated (06/05/2023)   Social Connection and Isolation Panel [NHANES]    Frequency of Communication with Friends and Family: More than three times a week    Frequency of Social Gatherings with Friends and Family: More than three times a week     Attends Religious Services: More than 4 times per year    Active Member of [employer] or Organizations: Yes    Attends Engineer, structural: More than 4 times per year    Marital Status: Married     Family History: The patient's family history includes Colon cancer in her father; Diabetes in her mother; Heart attack in her mother; Heart attack (age of onset: 62) in her brother. There is no history of Sleep apnea.  ROS:   Please see the history of present illness.     All other systems reviewed and are negative.  EKGs/Labs/Other Studies Reviewed:    The following studies were reviewed today:  Recent Labs: 05/01/2023: ALT 26 05/05/2023: Hemoglobin 14.8; Platelets 166.0; TSH 3.58 06/05/2023: BUN 15; Creatinine, Ser 0.72; Potassium 4.6; Sodium 139  Recent Lipid Panel    Component Value Date/Time   CHOL 153 01/25/2023 0818   CHOL 161 09/21/2021 1600   TRIG 125 01/25/2023 0819   HDL 51 01/25/2023 0818   HDL 84 09/21/2021 1600   CHOLHDL 3.0 01/25/2023 0818   VLDL 18 01/25/2023 0818   LDLCALC 84 01/25/2023 0818   LDLCALC 67 09/21/2021 1600   LDLCALC 90 11/14/2019 1102   Physical Exam: Blood pressure (!) 140/78, pulse 70, height 5\' 4"  (1.626 m), weight 101 lb 6.4 oz (46 kg), SpO2 98%.     GEN:  very thin, elderly  female in no acute distress HEENT: Normal NECK: No JVD; No carotid bruits LYMPHATICS: No lymphadenopathy CARDIAC: pominant pacer  ( from excessive muscle wasting )  RRR , no murmurs, rubs, gallops RESPIRATORY:  Clear to auscultation without rales, wheezing or rhonchi  ABDOMEN: Soft, non-tender, non-distended MUSCULOSKELETAL:  No edema; No deformity  SKIN: Warm and dry NEUROLOGIC:  Alert and oriented x 3       EKG:       ASSESSMENT:  1. Essential hypertension   2. Bilateral leg edema   3. Permanent atrial fibrillation (HCC)   4. Cerebrovascular accident (CVA), unspecified mechanism (HCC)     PLAN:      1.  Atrial fibrillation:   S/p AV node  ablation  S/p pacer    2.  Dizziness:    3.   HTN:      Legs are swolled.   Possibly due to inactivity and her amlodipine  therapy .  Will DC metoprolol  , DC amlodipine   Start bisoprolol 5 mg which may be more effective in lowering her BP compared to metoprolol      Medication Adjustments/Labs and Tests Ordered: Current medicines are reviewed at length with the patient today.  Concerns regarding medicines are outlined above.  No orders of the defined types were placed in this encounter.  Meds ordered this encounter  Medications   bisoprolol (ZEBETA) 5 MG tablet    Sig: Take 1 tablet (5 mg total) by mouth daily.    Dispense:  90 tablet    Refill:  3    Replaces Metoprolol  Discontinued Amlodipine  as well     Patient Instructions  Medication Instructions:  STOP Amlodipine  STOP Metoprolol  START Bisoprolol 5mg  each night at bedtime *If you need a refill on your cardiac medications before your next appointment, please call your pharmacy*  Follow-Up: At [facility], you and your health needs are our priority.  As part of our continuing mission to provide you with exceptional heart care, our providers are all part of one team.  This team includes your primary Cardiologist (physician) and Advanced Practice Providers or APPs (Physician Assistants and Nurse Practitioners) who all work together to provide you with the care you need, when you need it.  Your next appointment:   6 month(s)  Provider:   One of our Advanced Practice Providers (APPs): Melita Springer, PA-C  Friddie Jetty, NP Evaline Hill, NP  Theotis Flake, PA-C Lawana Pray, NP  Willis Harter, PA-C Lovette Rud, PA-C  [place], [place] Charles Connor, NP  Marlana Silvan, NP Marcie Sever, PA-C  Laquita Plant, PA-C    Dayna Dunn, PA-C  Marlyse Single, PA-C Palmer Bobo, NP Katlyn West, NP Callie Goodrich, PA-C  Evan Williams, PA-C Sheng Haley, PA-C  Xika Zhao, NP Liane Redman, PA-C      Signed, Ahmad Alert, MD  09/04/2023 8:32 PM    [facility]

## 2023-09-04 ENCOUNTER — Encounter: Payer: Self-pay | Admitting: Cardiovascular Disease

## 2023-09-04 ENCOUNTER — Ambulatory Visit: Attending: Cardiovascular Disease | Admitting: Cardiovascular Disease

## 2023-09-04 VITALS — BP 140/78 | HR 70 | Ht 64.0 in | Wt 101.4 lb

## 2023-09-04 DIAGNOSIS — I4821 Permanent atrial fibrillation: Secondary | ICD-10-CM | POA: Diagnosis not present

## 2023-09-04 DIAGNOSIS — I639 Cerebral infarction, unspecified: Secondary | ICD-10-CM | POA: Insufficient documentation

## 2023-09-04 DIAGNOSIS — I10 Essential (primary) hypertension: Secondary | ICD-10-CM | POA: Diagnosis not present

## 2023-09-04 DIAGNOSIS — R600 Localized edema: Secondary | ICD-10-CM | POA: Insufficient documentation

## 2023-09-04 MED ORDER — BISOPROLOL FUMARATE 5 MG PO TABS: 5.0000 mg | ORAL_TABLET | Freq: Every day | ORAL | 3 refills | Status: DC

## 2023-09-04 NOTE — Patient Instructions (Signed)
 Medication Instructions:  STOP Amlodipine  STOP Metoprolol  START Bisoprolol 5mg  each night at bedtime *If you need a refill on your cardiac medications before your next appointment, please call your pharmacy*  Follow-Up: At [facility], you and your health needs are our priority.  As part of our continuing mission to provide you with exceptional heart care, our providers are all part of one team.  This team includes your primary Cardiologist (physician) and Advanced Practice Providers or APPs (Physician Assistants and Nurse Practitioners) who all work together to provide you with the care you need, when you need it.  Your next appointment:   6 month(s)  Provider:   One of our Advanced Practice Providers (APPs): Melita Springer, PA-C  Friddie Jetty, NP Evaline Hill, NP  Theotis Flake, PA-C Lawana Pray, NP  Willis Harter, PA-C Lovette Rud, PA-C  [place], [place] Charles Connor, NP  Marlana Silvan, NP Marcie Sever, PA-C  Laquita Plant, PA-C    Dayna Dunn, PA-C  Marlyse Single, PA-C Palmer Bobo, NP Katlyn West, NP Callie Goodrich, PA-C  Evan Williams, PA-C Sheng Haley, PA-C  Xika Zhao, NP Kathleen Johnson, PA-C

## 2023-09-20 DIAGNOSIS — R350 Frequency of micturition: Secondary | ICD-10-CM | POA: Diagnosis not present

## 2023-09-20 DIAGNOSIS — N200 Calculus of kidney: Secondary | ICD-10-CM | POA: Diagnosis not present

## 2023-09-20 DIAGNOSIS — R310 Gross hematuria: Secondary | ICD-10-CM | POA: Diagnosis not present

## 2023-09-25 DIAGNOSIS — H401134 Primary open-angle glaucoma, bilateral, indeterminate stage: Secondary | ICD-10-CM | POA: Diagnosis not present

## 2023-09-28 ENCOUNTER — Ambulatory Visit (INDEPENDENT_AMBULATORY_CARE_PROVIDER_SITE_OTHER): Payer: Medicare Other

## 2023-09-28 DIAGNOSIS — I639 Cerebral infarction, unspecified: Secondary | ICD-10-CM | POA: Diagnosis not present

## 2023-09-28 LAB — CUP PACEART REMOTE DEVICE CHECK
Battery Remaining Longevity: 112 mo
Battery Voltage: 3 V
Brady Statistic AP VP Percent: 0 %
Brady Statistic AP VS Percent: 0 %
Brady Statistic AS VP Percent: 99.8 %
Brady Statistic AS VS Percent: 0.2 %
Brady Statistic RA Percent Paced: 0 %
Brady Statistic RV Percent Paced: 99.8 %
Date Time Interrogation Session: 20250605011253
Implantable Lead Connection Status: 753985
Implantable Lead Connection Status: 753985
Implantable Lead Implant Date: 20211206
Implantable Lead Implant Date: 20211206
Implantable Lead Location: 753858
Implantable Lead Location: 753860
Implantable Lead Model: 3830
Implantable Lead Model: 5076
Implantable Pulse Generator Implant Date: 20211206
Lead Channel Impedance Value: 304 Ohm
Lead Channel Impedance Value: 361 Ohm
Lead Channel Impedance Value: 399 Ohm
Lead Channel Impedance Value: 513 Ohm
Lead Channel Pacing Threshold Amplitude: 0.875 V
Lead Channel Pacing Threshold Pulse Width: 0.4 ms
Lead Channel Sensing Intrinsic Amplitude: 7.5 mV
Lead Channel Sensing Intrinsic Amplitude: 7.5 mV
Lead Channel Sensing Intrinsic Amplitude: 8.625 mV
Lead Channel Sensing Intrinsic Amplitude: 8.625 mV
Lead Channel Setting Pacing Amplitude: 2 V
Lead Channel Setting Pacing Pulse Width: 0.4 ms
Lead Channel Setting Sensing Sensitivity: 2 mV
Zone Setting Status: 755011

## 2023-09-29 ENCOUNTER — Other Ambulatory Visit: Payer: Self-pay | Admitting: Urology

## 2023-09-29 ENCOUNTER — Telehealth: Payer: Self-pay | Admitting: Cardiovascular Disease

## 2023-09-29 NOTE — Telephone Encounter (Signed)
   Pre-operative Risk Assessment    Patient Name: Mary Mercado  DOB: 09-Oct-1940 MRN: [id number]   Date of last office visit: 09/04/23  Date of next office visit: nothing yet    Request for Surgical Clearance    Procedure:  Bilateral ureteroscopy with possible biopsy and possible lithotripsy and stent    Date of Surgery:  Clearance 10/24/23                                Surgeon:  Dr. Marshell Skelton Group or Practice Name:  [facility]  Phone number:  [phone]  Fax number:  [phone]    Type of Clearance Requested:   - Medical    Type of Anesthesia:  General    Additional requests/questions:    Virgie Griffith   09/29/2023, 10:33 AM

## 2023-10-02 NOTE — Telephone Encounter (Signed)
   Patient Name: Mary Mercado  DOB: July 05, 1940 MRN: [id number]  Primary Cardiologist: Ahmad Alert, MD  Chart reviewed as part of pre-operative protocol coverage. Given past medical history and time since last visit, based on ACC/AHA guidelines, Mary Mercado is at acceptable risk for the planned procedure without further cardiovascular testing.   Per Dr. Alroy Aspen: "[name] is at low risk for her urologic procedure  She may hold her eliquis  for 2 days prior to procedure and restart as soon as it is safe from a surgical standpoint"  I will route this recommendation to the requesting party via Epic fax function and remove from pre-op pool.  Please call with questions.  Ava Boatman, NP 10/02/2023, 8:14 AM

## 2023-10-06 ENCOUNTER — Ambulatory Visit: Payer: Self-pay | Admitting: Cardiology

## 2023-10-08 ENCOUNTER — Other Ambulatory Visit: Payer: Self-pay | Admitting: Family Medicine

## 2023-10-09 ENCOUNTER — Other Ambulatory Visit: Payer: Self-pay | Admitting: Family Medicine

## 2023-10-09 NOTE — Telephone Encounter (Unsigned)
 Copied from CRM [id number]. Topic: Clinical - Medication Refill >> Oct 09, 2023  2:21 PM Pam Bode wrote: Medication: atorvastatin  (LIPITOR ) 80 MG tablet  Has the patient contacted their pharmacy? Yes (Agent: If no, request that the patient contact the pharmacy for the refill. If patient does not wish to contact the pharmacy document the reason why and proceed with request.) (Agent: If yes, when and what did the pharmacy advise?)  This is the patient's preferred pharmacy:  [id number] - [address] [place] [address] Phone: [phone] Fax: [phone]  Is this the correct pharmacy for this prescription? Yes If no, delete pharmacy and type the correct one.   Has the prescription been filled recently? Yes  Is the patient out of the medication? No  Has the patient been seen for an appointment in the last year OR does the patient have an upcoming appointment? Yes  Can we respond through MyChart? No  Agent: Please be advised that Rx refills may take up to 3 business days. We ask that you follow-up with your pharmacy.

## 2023-10-10 MED ORDER — ATORVASTATIN CALCIUM 80 MG PO TABS: 80.0000 mg | ORAL_TABLET | Freq: Every day | ORAL | 3 refills | Status: AC

## 2023-10-10 NOTE — Progress Notes (Addendum)
 COVID Vaccine Completed: yes  Date of COVID positive in last 90 days:  PCP - Corita Diego, MD Cardiologist - Ralene Burger, MD Neurologist- Ardella Beaver, MD LOV 05/15/23  Cardiac clearance by Palmer Bobo, NP 10/02/23 in epic   Chest x-ray - 01/24/23 Epic EKG - 05/01/23 Epic Stress Test - n/a ECHO - 01/25/23 Epic Cardiac Cath - n/a Pacemaker/ICD device last checked: 09/28/23 Epic- device orders in Epic dated 10/11/23 Spinal Cord Stimulator: n/a  Bowel Prep - no  Sleep Study - yes CPAP - yes, every night  Fasting Blood Sugar -  Checks Blood Sugar _____ times a day  Last dose of GLP1 agonist-  N/A GLP1 instructions:  Hold 7 days before surgery    Last dose of SGLT-2 inhibitors-  N/A SGLT-2 instructions:  Hold 3 days before surgery    Blood Thinner Instructions:  Pradaxa , patient is very concerned about holding Pradaxa  before surgery.. She states she missed one dose and she had a stroke. Instructed patient and husband Allayne Arabian) to call surgeon's office to see if she needs to hold for surgery. RN left voicemail with Dr.Showalter's scheduler regarding concern.  Aspirin  Instructions: Last Dose:  Activity level: Can perform activities of daily living without stopping and without symptoms of chest pain or shortness of breath. Ambulates with Rolator. No stairs due to being fall risk.  Anesthesia review: HTN, a fib, PVCs, strokes, dizziness, heart block, pacemaker  Patient denies shortness of breath, fever, cough and chest pain at PAT appointment  Patient verbalized understanding of instructions that were given to them at the PAT appointment. Patient was also instructed that they will need to review over the PAT instructions again at home before surgery.

## 2023-10-11 ENCOUNTER — Telehealth: Payer: Self-pay | Admitting: Neurology

## 2023-10-11 ENCOUNTER — Encounter: Payer: Self-pay | Admitting: Cardiology

## 2023-10-11 ENCOUNTER — Other Ambulatory Visit: Payer: Self-pay

## 2023-10-11 ENCOUNTER — Encounter (HOSPITAL_COMMUNITY): Payer: Self-pay

## 2023-10-11 ENCOUNTER — Encounter (HOSPITAL_COMMUNITY)
Admission: RE | Admit: 2023-10-11 | Discharge: 2023-10-11 | Disposition: A | Discharge status: Home / Self Care | Source: Ambulatory Visit | Attending: Urology | Admitting: Urology

## 2023-10-11 DIAGNOSIS — Z01812 Encounter for preprocedural laboratory examination: Secondary | ICD-10-CM | POA: Insufficient documentation

## 2023-10-11 DIAGNOSIS — R739 Hyperglycemia, unspecified: Secondary | ICD-10-CM | POA: Diagnosis not present

## 2023-10-11 HISTORY — DX: Headache, unspecified: R51.9

## 2023-10-11 HISTORY — DX: Anxiety disorder, unspecified: F41.9

## 2023-10-11 HISTORY — DX: Personal history of urinary calculi: Z87.442

## 2023-10-11 LAB — CBC
HCT: 44.6 % (ref 36.0–46.0)
Hemoglobin: 14.7 g/dL (ref 12.0–15.0)
MCH: 31.7 pg (ref 26.0–34.0)
MCHC: 33 g/dL (ref 30.0–36.0)
MCV: 96.1 fL (ref 80.0–100.0)
Platelets: 175 10*3/uL (ref 150–400)
RBC: 4.64 MIL/uL (ref 3.87–5.11)
RDW: 12.5 % (ref 11.5–15.5)
WBC: 5.2 10*3/uL (ref 4.0–10.5)
nRBC: 0 % (ref 0.0–0.2)

## 2023-10-11 LAB — BASIC METABOLIC PANEL WITH GFR
Anion gap: 8 (ref 5–15)
BUN: 21 mg/dL (ref 8–23)
CO2: 25 mmol/L (ref 22–32)
Calcium: 8.7 mg/dL — ABNORMAL LOW (ref 8.9–10.3)
Chloride: 104 mmol/L (ref 98–111)
Creatinine, Ser: 0.64 mg/dL (ref 0.44–1.00)
GFR, Estimated: >60 mL/min (ref >60)
Glucose, Bld: 148 mg/dL — ABNORMAL HIGH (ref 70–99)
Potassium: 4.1 mmol/L (ref 3.5–5.1)
Sodium: 137 mmol/L (ref 135–145)

## 2023-10-11 NOTE — Telephone Encounter (Signed)
 Received a surgical clearance. Last hospital admission was 01/2023. For embolic right MCA infarct and seizure. Will see if Camilo Cella can review in Dr Wynelle Heather absence

## 2023-10-11 NOTE — Telephone Encounter (Signed)
 As long as no new stroke/TIAs or symptoms since 01/2023, she can proceed with procedure from stroke standpoint as it has been over 6 months but clearance to hold Pradaxa  will need to be obtained by cardiology as she is on this for A fib.

## 2023-10-11 NOTE — Progress Notes (Signed)
 PERIOPERATIVE PRESCRIPTION FOR IMPLANTED CARDIAC DEVICE PROGRAMMING  Patient Information: Name:  Mary Mercado  DOB:  [birth year]  MRN:  [id number]    Planned Procedure:  Cystoscopy  Surgeon:  Dr. Cathi Cluster  Date of Procedure:  10/24/23  Cautery will be used.  Position during surgery:  unknown   Please send documentation back to:  [name] (Fax # [phone])  Device Information:  Clinic EP Physician:  Agatha Horsfall, MD   Device Type:  Pacemaker Manufacturer and Phone #:  [employer]tr[employer]: [phone] Pacemaker Dependent?:  Yes.   Date of Last Device Check:  09/28/2023  Normal Device Function?:  Yes.    Electrophysiologist's Recommendations:  Have magnet available. Provide continuous ECG monitoring when magnet is used or reprogramming is to be performed.  Procedure should not interfere with device function.  No device programming or magnet placement needed.  Per Device Clinic Standing Orders, Glorianne Largo, RN  11:33 AM 10/11/2023

## 2023-10-11 NOTE — Patient Instructions (Addendum)
 SURGICAL WAITING ROOM VISITATION  Patients having surgery or a procedure may have no more than 2 support people in the waiting area - these visitors may rotate.    Children under the age of 87 must have an adult with them who is not the patient.  Visitors with respiratory illnesses are discouraged from visiting and should remain at home.  If the patient needs to stay at the hospital during part of their recovery, the visitor guidelines for inpatient rooms apply. Pre-op nurse will coordinate an appropriate time for 1 support person to accompany patient in pre-op.  This support person may not rotate.    Please refer to the [facility] website for the visitor guidelines for Inpatients (after your surgery is over and you are in a regular room).     Your procedure is scheduled on: 10/24/23   Report to [facility] Main Entrance    Report to admitting at 7:00 AM   Call this number if you have problems the morning of surgery [phone]   Do not eat food or drink liquids :After Midnight.          If you have questions, please contact your surgeon's office.   FOLLOW BOWEL PREP AND ANY ADDITIONAL PRE OP INSTRUCTIONS YOU RECEIVED FROM YOUR SURGEON'S OFFICE!!!     Oral Hygiene is also important to reduce your risk of infection.                                    Remember - BRUSH YOUR TEETH THE MORNING OF SURGERY WITH YOUR REGULAR TOOTHPASTE  DENTURES WILL BE REMOVED PRIOR TO SURGERY PLEASE DO NOT APPLY Poly grip OR ADHESIVES!!!   Stop all vitamins and herbal supplements 7 days before surgery.   Take these medicines the morning of surgery with A SIP OF WATER:  Keppra     Ask surgeon about Pradaxa .  Bring CPAP mask and tubing day of surgery.                              You may not have any metal on your body including hair pins, jewelry, and body piercing             Do not wear make-up, lotions, powders, perfumes, or deodorant  Do not wear nail polish including gel and S&S,  artificial/acrylic nails, or any other type of covering on natural nails including finger and toenails. If you have artificial nails, gel coating, etc. that needs to be removed by a nail salon please have this removed prior to surgery or surgery may need to be canceled/ delayed if the surgeon/ anesthesia feels like they are unable to be safely monitored.   Do not shave  48 hours prior to surgery.    Do not bring valuables to the hospital. [facility] IS NOT             RESPONSIBLE   FOR VALUABLES.   Contacts, glasses, dentures or bridgework may not be worn into surgery.  DO NOT BRING YOUR HOME MEDICATIONS TO THE HOSPITAL. PHARMACY WILL DISPENSE MEDICATIONS LISTED ON YOUR MEDICATION LIST TO YOU DURING YOUR ADMISSION IN THE HOSPITAL!    Patients discharged on the day of surgery will not be allowed to drive home.  Someone NEEDS to stay with you for the first 24 hours after anesthesia.   Special Instructions: Bring  a copy of your healthcare power of attorney and living will documents the day of surgery if you haven't scanned them before.              Please read over the following fact sheets you were given: IF YOU HAVE QUESTIONS ABOUT YOUR PRE-OP INSTRUCTIONS PLEASE CALL [name]    If you received a COVID test during your pre-op visit  it is requested that you wear a mask when out in public, stay away from anyone that may not be feeling well and notify your surgeon if you develop symptoms. If you test positive for Covid or have been in contact with anyone that has tested positive in the last 10 days please notify you surgeon.    [facility] - Preparing for Surgery Before surgery, you can play an important role.  Because skin is not sterile, your skin needs to be as free of germs as possible.  You can reduce the number of germs on your skin by washing with CHG (chlorahexidine gluconate) soap before surgery.  CHG is an antiseptic cleaner which kills germs and bonds with the skin to  continue killing germs even after washing. Please DO NOT use if you have an allergy to CHG or antibacterial soaps.  If your skin becomes reddened/irritated stop using the CHG and inform your nurse when you arrive at Short Stay. Do not shave (including legs and underarms) for at least 48 hours prior to the first CHG shower.  You may shave your face/neck.  Please follow these instructions carefully:  1.  Shower with CHG Soap the night before surgery and the  morning of surgery.  2.  If you choose to wash your hair, wash your hair first as usual with your normal  shampoo.  3.  After you shampoo, rinse your hair and body thoroughly to remove the shampoo.                             4.  Use CHG as you would any other liquid soap.  You can apply chg directly to the skin and wash.  Gently with a scrungie or clean washcloth.  5.  Apply the CHG Soap to your body ONLY FROM THE NECK DOWN.   Do   not use on face/ open                           Wound or open sores. Avoid contact with eyes, ears mouth and   genitals (private parts).                       Wash face,  Genitals (private parts) with your normal soap.             6.  Wash thoroughly, paying special attention to the area where your    surgery  will be performed.  7.  Thoroughly rinse your body with warm water from the neck down.  8.  DO NOT shower/wash with your normal soap after using and rinsing off the CHG Soap.                9.  Pat yourself dry with a clean towel.            10.  Wear clean pajamas.            11.  Place clean sheets on your  bed the night of your first shower and do not  sleep with pets. Day of Surgery : Do not apply any lotions/deodorants the morning of surgery.  Please wear clean clothes to the hospital/surgery center.  FAILURE TO FOLLOW THESE INSTRUCTIONS MAY RESULT IN THE CANCELLATION OF YOUR SURGERY  PATIENT SIGNATURE_________________________________  NURSE  SIGNATURE__________________________________  ________________________________________________________________________

## 2023-10-11 NOTE — Telephone Encounter (Signed)
 Clearance form was given to Mary Mercado in medical records to be sent for the pt

## 2023-10-17 NOTE — Progress Notes (Signed)
 Anesthesia Chart Review   Case: [id number] Date/Time: 10/24/23 0900   Procedures:      CYSTOSCOPY WITH URETEROSCOPY (Bilateral) - POSSIBLE URETERAL BIOPSY     CYSTOSCOPY/URETEROSCOPY/HOLMIUM LASER/STENT PLACEMENT (Bilateral)   Anesthesia type: General   Diagnosis:      Calculus of kidney [N20.0]     Hematuria, gross [R31.0]   Pre-op diagnosis: RIGHT RENAL STONE AND GROSS HEMATURIA   Location: [facility] PROCEDURE [facility] / [facility]   Surgeons: Shane Steffan BROCKS, MD       DISCUSSION:83 y.o. never smoker with h/o HTN, CVA, pacemaker in place (device orders in 10/11/2023 progress note), right renal stone, gross hematuria scheduled for above procedure 10/24/2023 with Dr. Steffan Shane.   Per cardiology preoperative evaluation 10/02/2023, Chart reviewed as part of pre-operative protocol coverage. Given past medical history and time since last visit, based on ACC/AHA guidelines, Tashika Goodin Lafosse is at acceptable risk for the planned procedure without further cardiovascular testing.    Per Dr. Alveta: Corbin is at low risk for her urologic procedure  She may hold her eliquis  for 2 days prior to procedure and restart as soon as it is safe from a surgical standpoint  VS: BP (!) 157/106   Pulse 67   Temp 36.5 C (Oral)   Resp 14   Ht 5' 5 (1.651 m)   Wt 47.2 kg   SpO2 94%   BMI 17.31 kg/m   PROVIDERS:  Johnny Garnette LABOR, MD is PCP   Primary Cardiologist: Aleene Alveta, MD  LABS: Labs reviewed: Acceptable for surgery. (all labs ordered are listed, but only abnormal results are displayed)  Labs Reviewed  BASIC METABOLIC PANEL WITH GFR - Abnormal; Notable for the following components:      Result Value   Glucose, Bld 148 (*)    Calcium  8.7 (*)    All other components within normal limits  CBC     IMAGES:   EKG:   CV: Echo 01/25/2023 1. Left ventricular ejection fraction, by estimation, is 60 to 65%. The  left ventricle has normal function. The left ventricle has no regional   wall motion abnormalities. Left ventricular diastolic function could not  be evaluated.   2. Right ventricular systolic function is hyperdynamic. The right  ventricular size is mildly enlarged. There is moderately elevated  pulmonary artery systolic pressure. The estimated right ventricular  systolic pressure is 48.4 mmHg.   3. Left atrial size was severely dilated.   4. Right atrial size was severely dilated.   5. A small pericardial effusion is present. The pericardial effusion is  circumferential.   6. The mitral valve is normal in structure. Trivial mitral valve  regurgitation. No evidence of mitral stenosis.   7. Tricuspid valve regurgitation is severe.   8. The aortic valve is tricuspid. There is mild calcification of the  aortic valve. There is mild thickening of the aortic valve. Aortic valve  regurgitation is mild. Aortic valve sclerosis/calcification is present,  without any evidence of aortic  stenosis.   9. The inferior vena cava is dilated in size with <50% respiratory  variability, suggesting right atrial pressure of 15 mmHg.   Comparison(s): No significant change from prior study. Prior images  reviewed side by side.  Past Medical History:  Diagnosis Date   Allergy    Anxiety    CVA (cerebral vascular accident) (HCC) 05/2018, 01/09/20   Headache    History of kidney stones    Hyperlipidemia LDL goal <70    Hypertension  Pacemaker    Palpitations    Vertigo, benign positional     Past Surgical History:  Procedure Laterality Date   AV NODE ABLATION N/A 03/30/2020   Procedure: AV NODE ABLATION;  Surgeon: Fernande Elspeth BROCKS, MD;  Location[facility] INVASIVE CV LAB;  Service: Cardiovascular;  Laterality: N/A;   AV NODE ABLATION N/A 03/30/2020   Procedure: AV NODE ABLATION;  Surgeon: Fernande Elspeth BROCKS, MD;  Location: [facility] INVASIVE CV LAB;  Service: Cardiovascular;  Laterality: N/A;   CARDIOVERSION Left 10/31/2018   Procedure: CARDIOVERSION;  Surgeon: Alveta Aleene PARAS, MD;   Location: [facility] ENDOSCOPY;  Service: Cardiovascular;  Laterality: Left;   CARDIOVERSION N/A 01/08/2020   Procedure: CARDIOVERSION;  Surgeon: Loni Soyla LABOR, MD;  Location: [facility] ENDOSCOPY;  Service: Cardiovascular;  Laterality: N/A;   CATARACT EXTRACTION Bilateral    Hemilaminectomy and microdiskectomy at L4-5 on the left.  12/10/2003   IR CT HEAD LTD  01/24/2023   IR PERCUTANEOUS ART THROMBECTOMY/INFUSION INTRACRANIAL INC DIAG ANGIO  01/24/2023   IR US  GUIDE VASC ACCESS RIGHT  01/24/2023   PACEMAKER IMPLANT N/A 03/30/2020   Procedure: PACEMAKER IMPLANT;  Surgeon: Fernande Elspeth BROCKS, MD;  Location: [facility] INVASIVE CV LAB;  Service: Cardiovascular;  Laterality: N/A;   RADIOLOGY WITH ANESTHESIA N/A 01/24/2023   Procedure: IR WITH ANESTHESIA;  Surgeon: Radiologist, Medication, MD;  Location: [facility] OR;  Service: Radiology;  Laterality: N/A;   TEE WITHOUT CARDIOVERSION N/A 06/05/2018   Procedure: TRANSESOPHAGEAL ECHOCARDIOGRAM (TEE);  Surgeon: Pietro Redell RAMAN, MD;  Location: [facility] ENDOSCOPY;  Service: Cardiovascular;  Laterality: N/A;  loop    MEDICATIONS:  cephALEXin (KEFLEX) 500 MG capsule   atorvastatin  (LIPITOR ) 80 MG tablet   bisoprolol  (ZEBETA ) 5 MG tablet   dorzolamide -timolol  (COSOPT ) 2-0.5 % ophthalmic solution   latanoprost  (XALATAN ) 0.005 % ophthalmic solution   levETIRAcetam  (KEPPRA ) 500 MG tablet   losartan  (COZAAR ) 50 MG tablet   phenazopyridine  (PYRIDIUM ) 200 MG tablet   PRADAXA  150 MG CAPS capsule   No current facility-administered medications for this encounter.    regadenoson  (LEXISCAN ) injection SOLN 0.4 mg   technetium tetrofosmin  (TC-MYOVIEW ) injection 32 millicurie     [facility], PA-C [facility] Pre-Surgical Testing [phone]

## 2023-10-22 NOTE — H&P (Incomplete)
 83 year old female with history of hematuria. Evaluated with flexible cystoscopy showed no cancer. CT showed no identifiable causes of hematuria. She was referred for evaluation for gross hematuria with possible ureteroscopy.   PMH: CVA x 3, seizure, currently on blood thinners. Dr. Calhoun Pack Doctor, Dr. Eather Hare, no MI, No DVT, PE. Pt had Afib. Pt is on Dabigatran / Pradaxa .  Pulm: No COPD, asthma or OSA  PSH: Cardiac ablation,   Gross hematuria:  09/20/2023: Pt still having blood in urine 2 weeks, never a smoker, no other forms of tobacco  10/24/23: here today fro ureteroscopic evaluation   Kidney stone:  09/20/2023: R -sided nonobstructing stone on CT, having pain on R side.  67/1/25: here for sto  Frequency:  09/20/2023: not addressed.      ALLERGIES: Duloxetine  Macrobid  Tylenol     MEDICATIONS: Metoprolol  Tartrate 25 MG Tablet  amLODIPine  Besylate 2.5 MG Tablet  Atorvastatin  Calcium  80 MG Tablet 1 tablet PO Daily  Latanoprost  0.005 % Solution 1 PO Daily  levETIRAcetam  500 MG Tablet 1 tablet PO Daily  Losartan  Potassium 50 MG Tablet 1 tablet PO Daily  Pradaxa  150 MG Capsule 1 capsule PO Daily  Pyridium  200 MG Tablet     GU PSH: Cystoscopy - 08/09/2023       PSH Notes: Back Surgery, Foot Surgery, Tubal Ligation, Back Surgery   NON-GU PSH: Back Surgery (Unspecified) - 2004 Heart Surgery (Unspecified) Pacemaker placement Tubal Ligation - 2008     GU PMH: Gross hematuria - 08/10/2023, - 08/09/2023 Renal calculus - 08/10/2023, Nephrolithiasis, - 2014 Urinary Frequency - 08/09/2023      PMH Notes:  1898-04-25 00:00:00 - Note: Normal Routine History And Physical Senior Citizen [phone]  2006-08-15 09:44:10 - Note: Cancer  2006-08-11 10:47:03 - Note: Skin Cancer   NON-GU PMH: Anxiety Atrial Fibrillation Hypercholesterolemia Hypertension Seizure disorder Skin Cancer, History Sleep Apnea Stroke/TIA    FAMILY HISTORY: Colon Cancer - Father Diabetes - Runs In  Family, Mother, Brother Family Health Status Number - Runs In Family Heart Disease - Mother   SOCIAL HISTORY: No Social History     Notes: Marital History - Currently Married, Alcohol Use, Death In The Family Father, Death In The Family Mother, Occupation:, Tobacco Use   REVIEW OF SYSTEMS:    GU Review Female:   Patient denies frequent urination, hard to postpone urination, burning /pain with urination, get up at night to urinate, leakage of urine, stream starts and stops, trouble starting your stream, have to strain to urinate, and being pregnant.  Gastrointestinal (Upper):   Patient denies nausea, vomiting, and indigestion/ heartburn.  Gastrointestinal (Lower):   Patient denies diarrhea and constipation.  Constitutional:   Patient denies fever, night sweats, weight loss, and fatigue.  Skin:   Patient denies skin rash/ lesion and itching.  Eyes:   Patient denies blurred vision and double vision.  Ears/ Nose/ Throat:   Patient denies sore throat and sinus problems.  Hematologic/Lymphatic:   Patient denies swollen glands and easy bruising.  Cardiovascular:   Patient denies leg swelling and chest pains.  Respiratory:   Patient denies cough and shortness of breath.  Endocrine:   Patient denies excessive thirst.  Musculoskeletal:   Patient denies back pain and joint pain.  Neurological:   Patient denies headaches and dizziness.  Psychologic:   Patient denies depression and anxiety.   VITAL SIGNS: None   MULTI-SYSTEM PHYSICAL EXAMINATION:    Constitutional: Well-nourished, uses walker for ambulation.  Respiratory: No labored breathing, no use of accessory  muscles.   Cardiovascular: Normal temperature, normal extremity pulses, no swelling, no varicosities.   Neurologic / Psychiatric: Oriented, gets confused, has difficult to speaking with previous strokes.     Complexity of Data:  Source Of History:  Patient  Records Review:   Previous Patient Records  Urine Test Review:   Urinalysis   X-Ray Review: C.T. Abdomen/Pelvis: Reviewed Films. Reviewed Report. Discussed With Patient. No renal masses, no hydronephrosis, R lower pole renal stone. No ureteral abnormalities.     PROCEDURES:          Visit Complexity - G2211          Urinalysis w/Scope Dipstick Dipstick Cont'd Micro  Color: Yellow Bilirubin: Neg mg/dL WBC/hpf: 0 - 5/hpf  Appearance: Slightly Cloudy Ketones: Trace mg/dL RBC/hpf: 0 - 2/hpf  Specific Gravity: <=1.005 Blood: Neg ery/uL Bacteria: Rare (0-9/hpf)  pH: 5.5 Protein: 3+ mg/dL Cystals: NS (Not Seen)  Glucose: Neg mg/dL Urobilinogen: 1.0 mg/dL Casts: Hyaline    Nitrites: Neg Trichomonas: Not Present    Leukocyte Esterase: Trace leu/uL Mucous: Not Present      Epithelial Cells: NS (Not Seen)      Yeast: NS (Not Seen)      Sperm: Not Present    Notes: verified by repeat test    ASSESSMENT:      ICD-10 Details  1 GU:   Gross hematuria - R31.0 Chronic, Stable  2   Renal calculus - N20.0 Chronic, Stable  3   Urinary Frequency - R35.0    PLAN:           Orders Labs Urine Culture          Document Letter(s):  Created for Patient: Clinical Summary         Notes:   Gross hematuria: Patient wants to have further evaluation we discussed options of ureteroscopy with biopsy. Patient likely will not be able to come off her Pradaxa . Will get clearance from the cardiologist and neurologist mentioned above. Urine culture ordered today. Patient is hesitant we will start to set up surgery she will call back if she does not want to do surgery.   Kidney stone:  Nonobstructing right-sided kidney stone the pain is on the left. Patient would like this addressed if we do ureteroscopy.   Urine culture positive sent keflex 4 days prior to procedure   We discussed the risk benefits and alternatives to ureteroscopy. This includes bleeding, infection, damage to surrounding structures including the urethra, bladder, ureter, and kidney. With these possible injuries  resulting in need for intervention in the future. We discussed inability to remove all the stone and requiring follow-up ureteroscopy. We also discussed the possibility of not being able to gain access to the kidney and the need for nephrostomy tube. Possibility of long-term stent was also discussed. The patient voiced their understanding and would like to proceed.

## 2023-10-24 ENCOUNTER — Encounter (HOSPITAL_COMMUNITY)
Admission: RE | Disposition: A | Discharge status: Home / Self Care | Payer: Self-pay | Source: Ambulatory Visit | Attending: Urology

## 2023-10-24 ENCOUNTER — Ambulatory Visit (HOSPITAL_COMMUNITY)
Admission: RE | Admit: 2023-10-24 | Discharge: 2023-10-24 | Disposition: A | Discharge status: Home / Self Care | Source: Ambulatory Visit | Attending: Urology | Admitting: Urology

## 2023-10-24 ENCOUNTER — Encounter (HOSPITAL_COMMUNITY): Payer: Self-pay | Admitting: Urology

## 2023-10-24 ENCOUNTER — Ambulatory Visit (HOSPITAL_COMMUNITY): Admitting: Certified Registered Nurse Anesthetist

## 2023-10-24 ENCOUNTER — Ambulatory Visit (HOSPITAL_COMMUNITY)

## 2023-10-24 ENCOUNTER — Ambulatory Visit (HOSPITAL_COMMUNITY): Payer: Self-pay | Admitting: Physician Assistant

## 2023-10-24 DIAGNOSIS — N200 Calculus of kidney: Secondary | ICD-10-CM | POA: Diagnosis present

## 2023-10-24 DIAGNOSIS — I4891 Unspecified atrial fibrillation: Secondary | ICD-10-CM | POA: Insufficient documentation

## 2023-10-24 DIAGNOSIS — I272 Pulmonary hypertension, unspecified: Secondary | ICD-10-CM | POA: Insufficient documentation

## 2023-10-24 DIAGNOSIS — I679 Cerebrovascular disease, unspecified: Secondary | ICD-10-CM

## 2023-10-24 DIAGNOSIS — R310 Gross hematuria: Secondary | ICD-10-CM

## 2023-10-24 DIAGNOSIS — I480 Paroxysmal atrial fibrillation: Secondary | ICD-10-CM | POA: Diagnosis not present

## 2023-10-24 DIAGNOSIS — Z7901 Long term (current) use of anticoagulants: Secondary | ICD-10-CM | POA: Diagnosis not present

## 2023-10-24 DIAGNOSIS — Z7902 Long term (current) use of antithrombotics/antiplatelets: Secondary | ICD-10-CM | POA: Diagnosis not present

## 2023-10-24 DIAGNOSIS — I10 Essential (primary) hypertension: Secondary | ICD-10-CM | POA: Diagnosis not present

## 2023-10-24 DIAGNOSIS — Z8673 Personal history of transient ischemic attack (TIA), and cerebral infarction without residual deficits: Secondary | ICD-10-CM | POA: Diagnosis not present

## 2023-10-24 HISTORY — PX: CYSTOSCOPY WITH URETEROSCOPY: SHX5123

## 2023-10-24 SURGERY — CYSTOSCOPY WITH URETEROSCOPY
Anesthesia: General | Site: Ureter | Laterality: Bilateral

## 2023-10-24 MED ORDER — METHOCARBAMOL 750 MG PO TABS: 750.0000 mg | ORAL_TABLET | Freq: Four times a day (QID) | ORAL | 0 refills | Status: AC

## 2023-10-24 MED ORDER — PROPOFOL 10 MG/ML IV BOLUS
INTRAVENOUS | Status: AC
  Filled 2023-10-24: qty 20

## 2023-10-24 MED ORDER — ORAL CARE MOUTH RINSE
15.0000 mL | Freq: Once | OROMUCOSAL | Status: AC
Start: 2023-10-24 — End: 2023-10-24

## 2023-10-24 MED ORDER — FENTANYL CITRATE (PF) 100 MCG/2ML IJ SOLN
INTRAMUSCULAR | Status: AC
  Filled 2023-10-24: qty 2

## 2023-10-24 MED ORDER — SODIUM CHLORIDE 0.9 % IR SOLN
Status: DC | PRN
  Administered 2023-10-24: 3000 mL

## 2023-10-24 MED ORDER — CHLORHEXIDINE GLUCONATE 0.12 % MT SOLN
15.0000 mL | Freq: Once | OROMUCOSAL | Status: AC
Start: 2023-10-24 — End: 2023-10-24
  Administered 2023-10-24: 15 mL via OROMUCOSAL

## 2023-10-24 MED ORDER — LIDOCAINE HCL (PF) 2 % IJ SOLN
INTRAMUSCULAR | Status: DC | PRN
  Administered 2023-10-24: 50 mg

## 2023-10-24 MED ORDER — FENTANYL CITRATE PF 50 MCG/ML IJ SOSY: 25.0000 ug | PREFILLED_SYRINGE | INTRAMUSCULAR | Status: DC | PRN

## 2023-10-24 MED ORDER — HYOSCYAMINE SULFATE 0.125 MG PO TBDP: 0.1250 mg | ORAL_TABLET | Freq: Four times a day (QID) | ORAL | 0 refills | Status: DC | PRN

## 2023-10-24 MED ORDER — IOHEXOL 300 MG/ML  SOLN
INTRAMUSCULAR | Status: DC | PRN
  Administered 2023-10-24: 10 mL via URETHRAL

## 2023-10-24 MED ORDER — FENTANYL CITRATE (PF) 250 MCG/5ML IJ SOLN
INTRAMUSCULAR | Status: DC | PRN
  Administered 2023-10-24: 50 ug via INTRAVENOUS

## 2023-10-24 MED ORDER — CEFAZOLIN SODIUM-DEXTROSE 2-4 GM/100ML-% IV SOLN
2.0000 g | INTRAVENOUS | Status: AC
  Administered 2023-10-24: 2 g via INTRAVENOUS
  Filled 2023-10-24: qty 100

## 2023-10-24 MED ORDER — LACTATED RINGERS IV SOLN: INTRAVENOUS | Status: DC

## 2023-10-24 MED ORDER — PHENYLEPHRINE 80 MCG/ML (10ML) SYRINGE FOR IV PUSH (FOR BLOOD PRESSURE SUPPORT)
PREFILLED_SYRINGE | INTRAVENOUS | Status: DC | PRN
  Administered 2023-10-24 (×3): 80 ug via INTRAVENOUS

## 2023-10-24 MED ORDER — DROPERIDOL 2.5 MG/ML IJ SOLN: 0.6250 mg | Freq: Once | INTRAMUSCULAR | Status: DC | PRN

## 2023-10-24 MED ORDER — DEXAMETHASONE SODIUM PHOSPHATE 10 MG/ML IJ SOLN
INTRAMUSCULAR | Status: DC | PRN
  Administered 2023-10-24: 5 mg via INTRAVENOUS

## 2023-10-24 MED ORDER — TAMSULOSIN HCL 0.4 MG PO CAPS: 0.4000 mg | ORAL_CAPSULE | Freq: Every day | ORAL | 0 refills | Status: AC

## 2023-10-24 MED ORDER — ROCURONIUM BROMIDE 100 MG/10ML IV SOLN
INTRAVENOUS | Status: DC | PRN
  Administered 2023-10-24: 30 mg via INTRAVENOUS

## 2023-10-24 MED ORDER — PROPOFOL 10 MG/ML IV BOLUS
INTRAVENOUS | Status: DC | PRN
Start: 2023-10-24 — End: 2023-10-24
  Administered 2023-10-24: 70 mg via INTRAVENOUS

## 2023-10-24 MED ORDER — ONDANSETRON HCL 4 MG/2ML IJ SOLN
INTRAMUSCULAR | Status: DC | PRN
  Administered 2023-10-24: 4 mg via INTRAVENOUS

## 2023-10-24 MED ORDER — SUGAMMADEX SODIUM 200 MG/2ML IV SOLN
INTRAVENOUS | Status: DC | PRN
  Administered 2023-10-24: 100 mg via INTRAVENOUS

## 2023-10-24 SURGICAL SUPPLY — 18 items
BAG URO CATCHER STRL LF (MISCELLANEOUS) ×2 IMPLANT
BASKET ZERO TIP NITINOL 2.4FR (BASKET) IMPLANT
CATH URETL OPEN 5X70 (CATHETERS) ×1 IMPLANT
CLOTH BEACON ORANGE TIMEOUT ST (SAFETY) ×2 IMPLANT
EXTRACTOR STONE 1.7FRX115CM (UROLOGICAL SUPPLIES) IMPLANT
FIBER LASER MOSES 200 DFL (Laser) IMPLANT
FIBER LASER MOSES 365 DFL (Laser) IMPLANT
GLOVE BIO SURGEON STRL SZ8 (GLOVE) ×2 IMPLANT
GOWN STRL REUS W/ TWL XL LVL3 (GOWN DISPOSABLE) ×2 IMPLANT
GUIDEWIRE ANG ZIPWIRE 038X150 (WIRE) IMPLANT
GUIDEWIRE STR DUAL SENSOR (WIRE) ×3 IMPLANT
KIT TURNOVER KIT A (KITS) ×2 IMPLANT
MANIFOLD NEPTUNE II (INSTRUMENTS) ×2 IMPLANT
PACK CYSTO (CUSTOM PROCEDURE TRAY) ×2 IMPLANT
SHEATH NAVIGATOR HD 11/13X28 (SHEATH) IMPLANT
SHEATH NAVIGATOR HD 11/13X36 (SHEATH) ×2 IMPLANT
TUBING CONNECTING 10 (TUBING) ×2 IMPLANT
TUBING UROLOGY SET (TUBING) ×2 IMPLANT

## 2023-10-24 NOTE — Discharge Instructions (Signed)
 DISCHARGE INSTRUCTIONS FOR Ureteroscopy and/or Ureteral Stent Placement  MEDICATIONS:  1.  Robaxin 2. Tamsulosin  3. Hyoscyamine   ACTIVITY:  1. No strenuous activity x 1week  2. No driving while on narcotic pain medications  3. Drink plenty of water  4. Continue to walk at home - it is normal to see blood in the urine while the stent is in place, so keep active, but don't over do it.  5. May return to work/school tomorrow or when you feel ready  6. You may experience some pain when urinating in the kidney on the side that was operated on while the stent is in place this is normal  WHAT IS NORMAL TO EXPERIENCE: It is normal to feel the urge to urinate while the stent is in place It is normal to have blood in your urine while the stent is in place  It sometimes can be normal to have pain in your kidney when you urinate   BATHING:  1. You can shower and we recommend daily showers    DIET: You may return to your normal diet immediately. Because of the raw surface of your bladder, alcohol, spicy foods, acid type foods and drinks with caffeine may cause irritation or frequency and should be used in moderation. To keep your urine flowing freely and to avoid constipation, drink plenty of fluids during the day ( 8-10 glasses ). Tip: Avoid cranberry juice because it is very acidic.  SIGNS/SYMPTOMS TO CALL:  Please call us  if you have a fever greater than 101.5, uncontrolled nausea/vomiting, uncontrolled pain, dizziness, unable to urinate, bloody urine with clots greater than the size of a quarter, chest pain, shortness of breath, leg swelling, leg pain, redness around wound, drainage from wound, or any other concerns or questions.   You can reach us  at [phone].   FOLLOW-UP:  1. You have follow up in a few weeks for pathology review

## 2023-10-24 NOTE — Transfer of Care (Signed)
 Immediate Anesthesia Transfer of Care Note  Patient: Mary Mercado  Procedure(s) Performed: CYSTOSCOPY WITH URETEROSCOPY, BILATERAL RETROGRADE PYELOGRAM (Bilateral: Ureter)  Patient Location: PACU  Anesthesia Type:General  Level of Consciousness: drowsy and patient cooperative  Airway & Oxygen  Therapy: Patient Spontanous Breathing and Patient connected to nasal cannula oxygen   Post-op Assessment: Report given to RN and Post -op Vital signs reviewed and stable  Post vital signs: Reviewed and stable  Last Vitals:  Vitals Value Taken Time  BP 161/100 10/24/23 10:26  Temp    Pulse 70 10/24/23 10:29  Resp 13 10/24/23 10:29  SpO2 100 % 10/24/23 10:29  Vitals shown include unfiled device data.  Last Pain:  Vitals:   10/24/23 0808  PainSc: 0-No pain         Complications: No notable events documented.

## 2023-10-24 NOTE — Anesthesia Procedure Notes (Signed)
 Procedure Name: Intubation Date/Time: 10/24/2023 9:27 AM  Performed by: Cena Epps, CRNAPre-anesthesia Checklist: Patient identified, Emergency Drugs available, Suction available and Patient being monitored Patient Re-evaluated:Patient Re-evaluated prior to induction Oxygen  Delivery Method: Circle System Utilized Preoxygenation: Pre-oxygenation with 100% oxygen  Induction Type: IV induction Ventilation: Mask ventilation without difficulty Laryngoscope Size: Mac and 3 Grade View: Grade I Tube type: Oral Tube size: 7.0 mm Number of attempts: 1 Airway Equipment and Method: Stylet and Oral airway Placement Confirmation: ETT inserted through vocal cords under direct vision, positive ETCO2 and breath sounds checked- equal and bilateral Secured at: 22 cm Tube secured with: Tape Dental Injury: Teeth and Oropharynx as per pre-operative assessment

## 2023-10-24 NOTE — Anesthesia Preprocedure Evaluation (Addendum)
 Anesthesia Evaluation  Patient identified by MRN, date of birth, ID band Patient awake    Reviewed: Allergy & Precautions, NPO status , Patient's Chart, lab work & pertinent test results  Airway Mallampati: II  TM Distance: >3 FB Neck ROM: Full    Dental  (+) Dental Advisory Given   Pulmonary neg pulmonary ROS   Pulmonary exam normal breath sounds clear to auscultation       Cardiovascular hypertension, Pt. 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 Left ventricular ejection fraction, by estimation, is 60 to 65%. The left ventricle has normal function. The left ventricle has no regional wall motion abnormalities. Left ventricular diastolic function could not be evaluated.   2. Right ventricular systolic function is hyperdynamic. The right ventricular size is mildly enlarged. There is moderately elevated pulmonary artery systolic pressure. The estimated right ventricular systolic pressure is 48.4 mmHg.   3. Left atrial size was severely dilated.   4. Right atrial size was severely dilated.   5. A small pericardial effusion is present. The pericardial effusion is circumferential.   6. The mitral valve is normal in structure. Trivial mitral valve regurgitation. No evidence of mitral stenosis.   7. Tricuspid valve regurgitation is severe.   8. The aortic valve is tricuspid. There is mild calcification of the aortic valve. There is mild thickening of the aortic valve. Aortic valve regurgitation is mild. Aortic valve sclerosis/calcification is present, without any evidence of aortic stenosis.   9. The inferior vena cava is dilated in size with <50% respiratory variability, suggesting right atrial pressure of 15 mmHg.   Comparison(s): No significant change from prior study. Prior images reviewed side by side.       Echo 2022 1. Left ventricular ejection fraction, by estimation, is 60 to 65%. The left ventricle has normal function. The left ventricle has no regional wall motion abnormalities. Left ventricular diastolic parameters are indeterminate.   2. Right ventricular systolic function is normal. The right ventricular size is mildly enlarged. There is normal pulmonary artery systolic pressure. The estimated right ventricular systolic pressure is 28.4 mmHg.   3. Left atrial size was severely dilated.   4. Right atrial size was severely dilated.   5. The mitral valve is degenerative. Trivial mitral valve regurgitation.   6. 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   7. The aortic valve is tricuspid. Aortic valve regurgitation is trivial. No aortic stenosis is present.   8. The inferior vena cava is normal in size with <50% respiratory variability, suggesting right atrial pressure of 8 mmHg.   Comparison(s): No significant change from prior study.      Neuro/Psych Seizures -,  PSYCHIATRIC DISORDERS Anxiety     CVA    GI/Hepatic negative GI ROS, Neg liver ROS,,,  Endo/Other  negative endocrine ROS    Renal/GU negative Renal ROS     Musculoskeletal negative musculoskeletal ROS (+)    Abdominal  (+) - obese  Peds  Hematology negative hematology ROS (+)   Anesthesia Other Findings   Reproductive/Obstetrics                             Anesthesia Physical Anesthesia Plan  ASA: 4  Anesthesia Plan: General   Post-op Pain Management: Minimal or no pain anticipated  Induction: Intravenous  PONV Risk Score and Plan: 4 or greater and Ondansetron , Dexamethasone and Treatment may vary due to age or medical condition  Airway Management Planned: Oral ETT and LMA  Additional Equipment: None  Intra-op Plan:   Post-operative Plan: Extubation in OR  Informed Consent:  I have reviewed the patients History and Physical, chart, labs and discussed the procedure including the risks, benefits and alternatives for the proposed anesthesia with the patient or authorized representative who has indicated his/her understanding and acceptance.     Dental advisory given  Plan Discussed with: CRNA  Anesthesia Plan Comments:         Anesthesia Quick Evaluation

## 2023-10-24 NOTE — Op Note (Signed)
 Preoperative diagnosis: Gross hematuria with no origin Postoperative diagnosis: No evidence of tumor noted within the ureters or renal pelvis. Procedure:  Cystoscopy Bilateral retrograde pyelogram Bilateral diagnostic ureteroscopy  Surgeon: Steffan Pea MD.  Anesthesia: General  Complications: None  Intraoperative findings: Pan cystoscopy as well as bilateral ureteroscopy demonstrated no tumors. Patient did not stop any Bactrim  there is so there was significant bleeding there was a small stone in the lower pole of the right kidney due to bleeding unable to remove stone. No stents placed  Bilateral retrograde pyelogram interpretation Bilateral retrograde pyelograms demonstrated no filling defects no hydronephrosis no no irregularities of either ureter.  EBL: Minimal  Specimens: Right and left cytology  Disposition of specimens: [facility] for stone analysis  Indication: Mary Mercado is a 83 y.o.   patient with a history of gross hematuria negative cystoscopy in clinic poorly opacified ureters on CT urogram. After reviewing the management options for treatment, the patient elected to proceed with the above surgical procedure(s). We have discussed the potential benefits and risks of the procedure, side effects of the proposed treatment, the likelihood of the patient achieving the goals of the procedure, and any potential problems that might occur during the procedure or recuperation. Informed consent has been obtained.   Description of procedure:  The patient was taken to the operating room and general anesthesia was induced.  The patient was placed in the dorsal lithotomy position, prepped and draped in the usual sterile fashion, and preoperative antibiotics were administered. A preoperative time-out was performed.   Cystourethroscopy was performed.  The patient's urethra was examined and was normal.  Using the 70 and 30 degree scope pan pyeloscopy  was performed there is no abnormalities within the bladder no tumors or stones noted.  Attention was then turned to the left ureter a sensor wire was placed in the left ureter and a Pollick catheter was advanced along the the wire.  Once in the distal ureter the wire was removed retrograde pyelogram was performed showing no filling defects or no abnormalities within the collecting system.  Next the wire was reinserted and a single-lumen flexible ureteroscope was advanced into the collecting system.  The wire was then removed.  Pyeloscopy was performed there were no tumors or abnormalities noted within the left renal collecting system evaluation of the ureter upon removal showed no abnormalities within the ureter.  Since patient did not stop her dabigatran  there was significant bleeding that improved once the scope was removed.  Attention was then turned the right ureter a sensor wire was placed at the right ureteral orifice the 5 French Pollick catheter was advanced over the wire once in the distal aspect of the ureter the wire was removed a retrograde pyelogram was performed demonstrating no filling defects within the renal pelvis and no filling defects within the ureter or other abnormalities.  The wire was then reinserted and a flexible single-lumen ureteroscope was advanced over the wire into the right collecting system upon pan pyeloscopy there were no tumors noted within the collecting system there was a lower pole stone but due to bleeding secondary to being trapped is felt that the stone was in the lower pole would not cause problems and further laser will cause more bleeding removal the scope demonstrated no tumors in the ureter the scope was removed without complication.  Were irrigated and cytology was taken from both renal pelvises.  Once bladder is irrigated the bladder was drained.  The patient was then extubated  and taken the PACU in stable condition  Disposition: Patient will follow-up  with pathology results no stents were placed

## 2023-10-25 ENCOUNTER — Encounter (HOSPITAL_COMMUNITY): Payer: Self-pay | Admitting: Urology

## 2023-10-25 LAB — CYTOLOGY - NON PAP

## 2023-10-25 NOTE — Anesthesia Postprocedure Evaluation (Signed)
 Anesthesia Post Note  Patient: Milan Clare  Procedure(s) Performed: CYSTOSCOPY WITH URETEROSCOPY, BILATERAL RETROGRADE PYELOGRAM (Bilateral: Ureter)     Patient location during evaluation: PACU Anesthesia Type: General Level of consciousness: sedated and patient cooperative Pain management: pain level controlled Vital Signs Assessment: post-procedure vital signs reviewed and stable Respiratory status: spontaneous breathing Cardiovascular status: stable Anesthetic complications: no   No notable events documented.  Last Vitals:  Vitals:   10/24/23 1116 10/24/23 1118  BP: (!) 174/111 (!) 175/99  Pulse: 70   Resp: 14   Temp: (!) 36.2 C   SpO2: 99%     Last Pain:  Vitals:   10/24/23 1116  TempSrc: Temporal  PainSc: 0-No pain                 Norleen Pope

## 2023-11-07 NOTE — Progress Notes (Unsigned)
 [facility] [address] [place]. [place] [address] [phone]       OFFICE FOLLOW-UP NOTE  [name] Date of Birth:  February 15, 1941 Medical Record Number:  [id number]   Reason for visit: Stroke follow-up  Chief Complaint  Patient presents with   Cerebrovascular Accident    Rm 8 with spouse Pt is well and stable, reports no new stroke or sz concerns since last visit.      HPI:   Mary Mercado is a 83 y.o. 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  History of severe sleep apnea on ASV therapy followed by Dr. Buck.  Returns today for routine follow-up.     Update 11/08/2023 JM: Patient returns for follow-up visit accompanied by her husband.  Overall stable from neurological standpoint without new stroke/TIA symptoms.  Continued mild left-sided weakness, gait impairment and dysarthria overall stable.  Continues to ambulate with rollator walker, no recent falls. Reports compliance on Pradaxa  and atorvastatin  without side effects.  Routinely follows with PCP and cardiology for stroke risk factor management.  Reports compliance on Keppra , previously recommended reducing dosage from 500mg  BID to 250/500 as she has not had any recent seizures but patient called in February reporting some kind of tremor and difference in face since reducing and requested to go back to 500 mg BID dosing.  She continues to do well on this dosage, denies any seizure activity, tolerating without side  effects.  Routinely being followed by ophthalmology for glaucoma with gradual visual decline. Reports recently being diagnosed with kidney stone and was placed on medication post cystoscopy which she believes caused worsening of glaucoma where she is now only able to see black and white. She has f/u visit with ophthalmology next week and has follow-up visit with nephrology this Friday.  Remains on nightly ASV therapy with compliance report below showing excellent usage at 97% >4 hr usage with residual AHI 5.1.  Average usage 8 hours and 6 minutes. EPAP of 9 cm, minimum pressure support of 3 cm, maximum pressure support of 12 cm  No significant leaks.  Continues to tolerate therapy well and reports continued benefit in regards to sleep quality and daytime energy levels.  Routinely follows with DME [employer] and up to date on supplies.   She also mentions bilateral lower extremity edema.  Cardiology discontinued amlodipine  and metoprolol  back in May and placed on bisoprolol . Reports gradual improvement of edema but still persistent up to shin level.  Uses compression stockings occasionally but not consistently.  Previously followed by Dr. Alveta and plans on establishing care with new cardiologist per husband as he recently retired.       Update 05/15/2023 Dr. Rosemarie: Mary Mercado is a 83 year old pleasant Caucasian lady seen today for office consultation visit for recent stroke.  She is accompanied by her husband.  History is obtained from them and review of electronic medical records.  I personally reviewed pertinent available imaging films in PACS.  She presented to the emergency room on 01/24/2023 as a code stroke for evaluation  for sudden onset of speech difficulties left-sided weakness and facial droop.  She presented outside time window for thrombolysis.  She had recently been tested positive for COVID a few weeks ago.  CT angiogram showed right M1 occlusion with changes of widespread intracranial  atherosclerosis.  CT perfusion showed 20 cc of infarct core with an extensive penumbra.  She was taken for emergent mechanical thrombectomy which was performed successfully with complete revascularization of the occluded right M1.  Patient could not have an MRI due to pacemaker having the lead in the atrium making it MRI incompatible.  2D echo showed ejection fraction of 60 to 65%.  LDL cholesterol 61 mg percent.  Hemoglobin A1c was 6.6.  Patient did very well clinically and made good recovery.  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  Beam hardening artifact made interpretation difficult for an acute infarct.  Patient had history of A-fib and had previously been on Pradaxa  but when she tested positive for COVID and was started on Paxlovid  she was advised to reduce the dose of Pradaxa  however the patient and husband misunderstood this and were taking Pradaxa  once a day only when she had the stroke patient was transferred to inpatient rehab where she made steady improvement in has been now at home for last 3 months.  She is able to ambulate with a wheeled walker.  She still has mild left-sided weakness but this is mostly from her old stroke.  She has some mild dysphagia as well.  Of late she has noticed some clouding of her vision which may be related to her Glaucoma.  Her blood pressure was recently elevated and her primary care physician added amlodipine  which seems to have helped.  She is currently living in retirement community and doing physical therapy several days a week.  She is tolerating Pradaxa  well without significant bleeding and only minor bruising.  She is also on Keppra  for seizure prophylaxis and takes it twice daily and has done well without recurrent seizures.   Update 11/02/2022 JM: She is accompanied today by her husband.  Patient has been stable since prior visit.  Denies new stroke/TIA symptoms.   Reports continued left-sided zap type sensations which are unchanged since prior visit.  Continues to follow with PCP and cardiology for stroke risk factor management and compliant on stroke prevention medications.  Denies any seizure activity on Keppra  500 mg twice daily, denies side effects.  Patient remains compliant on ASV therapy and tolerating well with download report over the past 30 days showing 100% compliance and residual AHI 3.8 with low leak at 1.2 and 95th percentile on EPAP of 8, minimum pressure support of 3 and maximum pressure support of 12.  No questions or concerns regarding ASV therapy.  Routinely follows with DME company and up to date on supplies.  Update 03/09/2022 JM: Patient returns for 77-month stroke follow-up accompanied by her husband.  Overall stable without new stroke/TIA symptoms.  Residual deficits stable.  Remains on Keppra  500 mg twice daily, denies any seizure activity.  Remains on Pradaxa  and atorvastatin .  Blood pressure well controlled.  Was last seen by Dr. Buck 7/3 for sleep apnea with plans on 1 year follow-up.  Does report continued nightly use of CPAP.  Does still have some issues with anxiety but greatly improved since prior visit, has not yet further discussed with PCP.  She does mention being scheduled for cataract removal on  12/14 and requesting surgical clearance to proceed.  No new concerns at this time.  Update 09/01/2021 JM: Previously seen 6 months ago accompanied by her husband.  Overall stable without new stroke/TIA symptoms.  Residual deficits stable without worsening. Continues to experience significant anxiety - has not further discussed with PCP. She has been participating in yoga twice weekly which she believes has been helpful.  Compliant on Pradaxa  and atorvastatin , denies side effects.  Blood pressure today 149/87.  Occasionally monitors  at home and typically stable.  No additional witnessed seizure activity.  Attempted use of Keppra  XR due to  concerning symptoms on IR formulation, she decided to return back to IR formulation due to concern of XR capsule not dissolving (found in BM) and c/o intermittent numbness since change to XR.  She is currently on Keppra  500 mg twice daily without specific side effects.  Continues to follow with Dr. Buck for OSA on BiPAP reporting nightly usage.  No new concerns at this time.  Update 03/10/2021 JM: Returns for 37-month stroke and seizure follow-up accompanied by her husband  Stable since prior visit -denies new stroke/TIA symptoms or seizure activity Reports continued imbalance and left hand and foot occasional numbness which has been present since her prior strokes.  Continues to ambulate with RW -denies any recent falls. She does report episodes typically occurring midday and evening consisting of feeling shaky/trembling, and just feeling generally bad and will improve after sitting for a minute.  No other associated symptoms such as increased weakness, altered awareness/confusion, loss of consciousness, headache or speech changes.  At first, she reported this has been persistent since her stroke but then mentioned it has become more frequent since her seizure. She does believe it usually occurs with increased fatigue or anxiety. This was previously discussed at prior visit almost 2 years ago (see OV 04/2019).  She becomes very anxious with increased L>R UE tremor while discussing this.   Compliant on Keppra  500 mg twice daily  Compliant on Pradaxa  and atorvastatin  80 mg daily -denies side effects Blood pressure today 151/84 - occasionally monitors at home - has been running 140s/90s - cardiology is aware  Continues to follow with Dr. Buck for sleep apnea monitoring and management  No further concerns at this time   Update 09/07/2020 Dr. Rosemarie; Patient is seen today accompanied by her husband following recent hospital admission for seizures in March 2022.  She is seen today for a new problem of  seizures.  Patient developed sudden onset of speech difficulties on 06/28/2020 with left upper extremity twitching's.  During transport by EMS she also complained of right-sided headache.  Upon arrival in the ER her twitchings resolved after treatment with IV lorazepam  except for mild left thumb twitching.  She was loaded with IV Keppra  and the involuntary movements stopped.  EEG showed focal right-sided slowing and MRI scan was negative for acute infarct LDL cholesterol was 71 mg percent and home statin dose was continued.  Hemoglobin A1c was 6.4.  Patient was on Pradaxa  150 mg twice daily which was continued.  Patient states she is tolerating Keppra  well without any dizziness or other side effects.  She has had no recurrent seizures or twitching's.  She has had no recurrent stroke or TIA symptoms either.  She is tolerating Pradaxa  well without bruising or bleeding.  She is seeing Dr. Buck for her sleep apnea and has been using her CPAP every night.  Blood pressure is normally well controlled at home the today  it is elevated in office at 150/86.  She has no new complaints.  She has no prior history of seizures, significant head injury with loss of consciousness.  But she does have history of multiple strokes in the past2/2020 admitted for left-sided numbness weakness, status post TPA, MRI showed right frontal parietal infarct.  CTA head neck showed right P2, left VA, bilateral ICA siphon severe stenosis.  EF 60 to 65%.  Negative for DVT, TEE negative.  LDL 117, A1c 5.9.  Discharged with DAPT and Lipitor  40 30-day CardioNet monitoring showed A. fib patient started on Eliquis . 12/2019  Admitted for right cerebellar infarct.  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  EF 60 to 65%, LDL 81, A1c 5.9.  Eliquis  changed to Pradaxa , continue Lipitor  80.   Update 06/03/2020 JM: Mary Mercado returns for 51-month stroke follow-up.  She has been stable from a stroke standpoint  since prior visit without new stroke/TIA symptoms and reports residual gait unsteadiness with imbalance and dysarthria. She continues to use rolling walker and denies any recent falls. She also reports chronic left leg and torso numbness with occasional burning sensation which is chronic and denies worsening. She has remained on Pradaxa  and atorvastatin  80 mg daily without side effects.  Blood pressure today 147/86.  She has routinely followed with cardiology for continued dizziness which she reports is in relation to blood pressure and cardiology currently working on adjustments.  She was evaluated by Dr. Buck and was found to have severe sleep apnea. Recommended titration study but patient requested holding off until further recovery from undergoing ablation for persistent A. 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 She is not ready to proceed with titration study. No further concerns at this time.  Initial follow-up 02/03/2020 JM: She was discharged home from CIR on 01/22/2020 after an 8-day stay.  Since discharge, she reports residual  whooshy head sensation, gait unsteadiness and mild dysarthria but reports ongoing improvement.  Recommended outpatient PT/OT/SLP at discharge from CIR but referral was not placed at discharge.  She has continued to do exercises at home as recommended during CIR stay.  Continues to use Rollator walker for ambulation and denies any recent falls.  Denies vertigo or dizziness sensation.  Denies new or worsening stroke/TIA symptoms.  Prior stroke deficit of left-sided paresthesias greatly improving and only experience occasional numbness/tingling.  Remains on Pradaxa  without bleeding or bruising.  Remains on atorvastatin  80 mg daily without myalgias.  Blood pressure today 122/76.  Monitors at home which has been stable.  She does report urinary frequency over the past week and is concerned for possible UTI but has been increasing water intake and drinking cranberry juice.   Denies fever, dysuria or hematuria.  Typically, nocturia only on occasion but does report snoring, daytime naps and occasional insomnia.  She has not previously underwent sleep study.  No further concerns at this time.  Stroke admission 01/09/2020 Presented to ER with slurred speech, facial droop vertigo and ataxia while at her cardiologist office the morning of 01/09/2020.  With stroke work-up revealing right cerebellar infarct, embolic secondary to AF despite being on Eliquis .  Switched anticoagulation to Pradaxa .  HTN stable.  LDL 81 and increase atorvastatin  from 40 mg to 80 mg daily.  Other stroke risk factors include advanced age and history of right MCA stroke in 05/2018.  Evaluated by therapy with residual dysarthria, mild memory deficits, vestibular symptoms and imbalance affecting ADLs and  mobility therefore discharged to CIR with functional decline.  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 Old infarct R frontoparietal cortex. Small vessel disease. ASPECTS 10.    CTA head & neck no LVO. B ICA bifurcation atherosclerosis, supraclinoid B ICA atherosclerosis. L VA origin moderate to severe stenosis. R P2 moderate stenosis.  MRI  R superior cerebellar territory infarct. Evolution of prior R MCA infarcts from last year. Small vessel disease.  2D Echo EF 60-65%. No source of embolus. LA severely dilated. LDL 81 HgbA1c 5.9 VTE prophylaxis - Lovenox  40 mg sq daily  Eliquis  (apixaban ) daily prior to admission, now on aspirin  81 mg daily.   Switched to Pradaxa  Therapy recommendations:   CIR Disposition:   CIR     Update 08/01/2019 JM: Mary Mercado is a 83 year old female who is being seen today, 08/01/2019, for stroke follow-up with residual left-sided paresthesias and post stroke anxiety accompanied by her husband.  Initiated Cymbalta  at prior visit due to ongoing paresthesias and severe anxiety but unfortunately caused  hypertension therefore advised to discontinue. Paresthesias have been stable from prior visit without worsening with intermittent symptoms that occur randomly.  She does continue to have occasional balance difficulties and if ambulating or standing for too long she will start to feel off.  Referral placed at prior visit to PT but apparently has not been called to schedule initial evaluation.  She continues to experience anxiety which has only been present for stroke but does feel slight improvement since prior visit.  Remains on Eliquis  and atorvastatin  without side effects.  Blood pressure today 132/74.  No further concerns at this time.  Update 05/02/2019: Mary Mercado is a 83 year old female who is being seen today for stroke follow-up accompanied by her husband.  Residual deficits left-sided paresthesias consisting of numbness/tingling and occasional burning sensation.  She also endorses episodes of sensation of full body intermittently shaking and is usually worsened by stress, anxiety or fatigue.  Usually accompanied by nausea but denies headache or dizziness.  She becomes fearful during those times that she may be having a heart attack.  When questioned regarding anxiety, she does endorse minimal anxiety but after doing GAD-7 test, score of 20/21 showing severe anxiety.  She denies any prior history or family history of depression or anxiety.  She continues on Eliquis  without bleeding or bruising.  She continues on atorvastatin  without myalgias.  Blood pressure today 140/81.  She denies any additional episodes of dizziness and continues to follow with cardiology for blood pressure management.  She has recently decreased telmisartan  as it is recommended to continue blood pressure between 130-150 due to vertebral artery stenosis.  No further concerns at this time.   Update 11/19/2018 Dr. Rosemarie: She is seen today for office follow-up visit following initial video follow-up visit on 08/13/2018.  She is  accompanied by her husband.  She states she continues to have left leg paresthesias and numbness.  She has some mild gait and balance difficulties.  At times she noticed that the legs are trembling and she is initiating walk and she has to hold on something and the feeling goes away.  She has also been started on several new cardiac medications per her ablation for A. fib and feels medication may be the cause for her dizziness.  She is tolerating Eliquis  well without bruising or bleeding.  Blood pressures well controlled and today it is 136/79.  She remains on  Lipitor  which is tolerating well without muscle aches and pains.  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  Initial video visit 08/13/2018 Dr. Rosemarie : This is a initial video virtual consultation visit on Mary Mercado who was admitted to [facility] in February 2020 with a stroke.  I have personally obtained history of presenting illness from the patient and her husband and reviewed electronic medical records as well as imaging films in PACS.  She was admitted on 05/31/2018 with sudden onset of left arm and leg weakness and numbness as well as feeling dizzy and had one episode of emesis.  She had CT scan and CT angiogram in the ER which showed no large vessel occlusion she was given IV TPA and admitted to the neurological intensive care unit.  NIH stroke scale on admission was 4.  At baseline modified Rankin score was 0.  Patient had tight blood pressure control.  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  Transthoracic echo showed normal ejection fraction.  Transesophageal echocardiogram showed no cardiac source of embolism or PFO.  LDL cholesterol was elevated at 1 1 7  mg percent.  Hemoglobin A1c was 5.9.  Urine drug screen was negative.  Lower extremity venous Dopplers were negative for DVT.  CT angiogram  of the brain showed mild intracranial atherosclerosis involving right P2 and bilateral cavernous carotid siphons and severe left vertebral artery stenosis but there was no significant disease of the right middle cerebral or carotid artery.  Patient was started on aspirin  81 and Plavix  75 mg daily for 3 weeks and underwent an outpatient 30-day heart monitor which subsequently showed paroxysmal atrial fibrillation.  She has since then stopped aspirin  and Plavix  and has been switched to Eliquis .  Patient states she has done well since discharge.  She was initially transferred to inpatient rehab where she stayed for a few weeks and did well.  She is still has left-sided numbness but feels some of the sensation may be coming back now.  She can walk independently without assistance though her husband stays close by.  She has had no falls or injuries.  She is tolerating Eliquis  well without bleeding or bruising.  She is also tolerating Lipitor  well.  She complains of little bit of jitteriness and tiredness but she blames this likely on her new A. fib medication.  She has no new complaints.  She has no prior history of strokes TIAs.        ROS:   14 system review of systems is positive for those listed in HPI and all other systems negative  PMH:  Past Medical History:  Diagnosis Date   Allergy    Anxiety    CVA (cerebral vascular accident) (HCC) 05/2018, 01/09/20   Headache    History of kidney stones    Hyperlipidemia LDL goal <70    Hypertension    Pacemaker    Palpitations    Vertigo, benign positional     Social History:  Social History   Socioeconomic History   Marital status: Married    Spouse name: [name]   Number of children: Not on file   Years of education: 12+   Highest education level: Not on file  Occupational History    Comment: retired  Tobacco Use   Smoking status: Never   Smokeless tobacco: Never  Vaping Use  Vaping status: Never Used  Substance and Sexual Activity    Alcohol use: Never   Drug use: Never   Sexual activity: Not on file  Other Topics Concern   Not on file  Social History Narrative   Lives with husband   caffeine none daily   Right Handed   Social Drivers of Health   Financial Resource Strain: Low Risk  (06/05/2023)   Overall Financial Resource Strain (CARDIA)    Difficulty of Paying Living Expenses: Not hard at all  Food Insecurity: No Food Insecurity (06/05/2023)   Hunger Vital Sign    Worried About Running Out of Food in the Last Year: Never true    Ran Out of Food in the Last Year: Never true  Transportation Needs: No Transportation Needs (06/05/2023)   PRAPARE - Administrator, Civil Service (Medical): No    Lack of Transportation (Non-Medical): No  Physical Activity: Insufficiently Active (06/05/2023)   Exercise Vital Sign    Days of Exercise per Week: 3 days    Minutes of Exercise per Session: 30 min  Stress: No Stress Concern Present (06/05/2023)   [employer] of Occupational Health - Occupational Stress Questionnaire    Feeling of Stress : Not at all  Social Connections: Socially Integrated (06/05/2023)   Social Connection and Isolation Panel    Frequency of Communication with Friends and Family: More than three times a week    Frequency of Social Gatherings with Friends and Family: More than three times a week    Attends Religious Services: More than 4 times per year    Active Member of [employer] or Organizations: Yes    Attends Engineer, structural: More than 4 times per year    Marital Status: Married  Catering manager Violence: Not At Risk (06/05/2023)   Humiliation, Afraid, Rape, and Kick questionnaire    Fear of Current or Ex-Partner: No    Emotionally Abused: No    Physically Abused: No    Sexually Abused: No    Medications:   Current Outpatient Medications on File Prior to Visit  Medication Sig Dispense Refill   atorvastatin  (LIPITOR ) 80 MG tablet Take 1 tablet (80 mg total) by mouth  daily. 90 tablet 3   dorzolamide -timolol  (COSOPT ) 2-0.5 % ophthalmic solution Place 1 drop into both eyes 2 (two) times daily.     latanoprost  (XALATAN ) 0.005 % ophthalmic solution Place 1 drop into both eyes every evening.     levETIRAcetam  (KEPPRA ) 500 MG tablet Take 1 tablet (500 mg total) by mouth 2 (two) times daily.     losartan  (COZAAR ) 50 MG tablet TAKE 2 TABLETS BY MOUTH EVERY DAY 180 tablet 1   PRADAXA  150 MG CAPS capsule TAKE 1 CAPSULE BY MOUTH TWICE A DAY 180 capsule 1   Current Facility-Administered Medications on File Prior to Visit  Medication Dose Route Frequency Provider Last Rate Last Admin   regadenoson  (LEXISCAN ) injection SOLN 0.4 mg  0.4 mg Intravenous Once Lonni Slain, MD       technetium tetrofosmin  (TC-MYOVIEW ) injection 32 millicurie  32 millicurie Intravenous Once PRN Lonni Slain, MD        Allergies:   Allergies  Allergen Reactions   Duloxetine      Causes Bp elevation.  Increased body jerks, nausea   Acetaminophen  Hives and Rash    Other Reaction(s): Other (See Comments)  rash    Physical Exam  Today's Vitals   11/08/23 1311 11/08/23 1318  BP: (!) 166/101 ROLLEN)  155/96  Pulse: 71   Weight: 101 lb (45.8 kg)   Height: 5' 5 (1.651 m)     Body mass index is 16.81 kg/m.   General: frail very pleasant elderly Caucasian lady seated, with mild anxiety Head: head normocephalic and atraumatic.  Neck: supple with no carotid or supraclavicular bruits Cardiovascular: regular rate and rhythm, no murmurs Musculoskeletal: no deformity Skin:  lower extremity pitting edema bilaterally up to shin level Vascular:  Normal pulses all extremities  Neurologic Exam Mental Status: Awake and fully alert.  Mild dysarthria with occasional speech hesitancy and word finding difficulty. Oriented to place and time. Recent and remote memory intact. Attention span, concentration and fund of knowledge appropriate. Mood and affect appropriate Cranial Nerves:  Pupils equal, briskly reactive to light. Extraocular movements full without nystagmus. Visual fields full to confrontation. Hearing intact. Facial sensation intact. Face, tongue, palate moves normally and symmetrically.  Motor: Normal bulk and tone. Normal strength in all tested extremity muscles except mild left hip flexor weakness and decreased left hand finger dexterity Sensory.:  Intact light touch, vibratory and pinprick sensation Coordination: Rapid alternating movements normal in all extremities except decreased left hand. Finger-to-nose and heel-to-shin performed accurately bilaterally.  Mild action tremors bilateral upper extremities.  No tremors at rest or evidence of cogwheel rigidity or bradykinesia Gait and Station: Arises from chair without difficulty. Stance is normal. Gait demonstrates normal stride length and mild unsteadiness/imbalance with use of Rollator walker. Reflexes: 1+ and symmetric. Toes downgoing.          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  Vascular risk factors of hypertension, hyperlipidemia, A. 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 Fib Hx of R MCA stroke -Residual deficit: Left-sided numbness/tingling, gait impairment with imbalance and mild dysarthria -stable  -Continue Pradaxa  and atorvastatin  80 mg daily for secondary stroke prevention managed by  PCP/cardiology -Discussed secondary stroke prevention measures and importance of close PCP and cardiology follow-up for aggressive stroke risk factor management including HTN with BP<130/90 and HLD with LDL goal<70. -Continue close follow-up with cardiology for atrial fibrillation and Pradaxa  management  Seizures, late effect of stroke -continue keppra  500 mg twice daily -refill provided -No additional seizure activity -Routine lab work by PCP  Severe sleep apnea -Compliant on ASV therapy -Discussed importance of continued nightly usage and continue greater than 4 hours per night for optimal benefit -Continue to follow with [facility] for any needed supplies or ASV related concerns  LE swelling -gradually improving after medication changes - Discussed conservative measures such as limiting sodium intake, compression socks, elevation and increasing physical activity - Follow-up with cardiology if symptoms persist      Follow-up in 6-8 months or call earlier if needed    CC:  Johnny Garnette LABOR, MD     I personally spent a total of 45 minutes in the care of the patient today including preparing to see the patient, getting/reviewing separately obtained history, performing a medically appropriate  exam/evaluation, counseling and educating, placing orders, and documenting clinical information in the EHR.   Harlene Bogaert, AGNP-BC  [facility] [address] [place], [place] [address]  Phone [phone] Fax [phone] Note: This document was prepared with digital dictation and possible smart phrase technology. Any transcriptional errors that result from this process are unintentional.

## 2023-11-08 ENCOUNTER — Encounter: Payer: Self-pay | Admitting: Adult Health

## 2023-11-08 ENCOUNTER — Ambulatory Visit (INDEPENDENT_AMBULATORY_CARE_PROVIDER_SITE_OTHER): Payer: Medicare Other | Admitting: Adult Health

## 2023-11-08 VITALS — BP 155/96 | HR 71 | Ht 65.0 in | Wt 101.0 lb

## 2023-11-08 DIAGNOSIS — Z8673 Personal history of transient ischemic attack (TIA), and cerebral infarction without residual deficits: Secondary | ICD-10-CM

## 2023-11-08 DIAGNOSIS — G4730 Sleep apnea, unspecified: Secondary | ICD-10-CM | POA: Diagnosis not present

## 2023-11-08 DIAGNOSIS — R569 Unspecified convulsions: Secondary | ICD-10-CM | POA: Diagnosis not present

## 2023-11-08 DIAGNOSIS — I6601 Occlusion and stenosis of right middle cerebral artery: Secondary | ICD-10-CM

## 2023-11-08 DIAGNOSIS — I639 Cerebral infarction, unspecified: Secondary | ICD-10-CM | POA: Diagnosis not present

## 2023-11-08 MED ORDER — LEVETIRACETAM 500 MG PO TABS: 500.0000 mg | ORAL_TABLET | Freq: Two times a day (BID) | ORAL | Status: AC

## 2023-11-09 NOTE — Progress Notes (Signed)
 Dazia Lippold D, CMA  Joylene Bradley; Garcia, Patricia; Ziegler, Melissa; Tucker, Dolanda; Cain, Edwards New orders have been placed for the above pt, DOB: 2041-04-09 Thanks

## 2023-11-10 DIAGNOSIS — R310 Gross hematuria: Secondary | ICD-10-CM | POA: Diagnosis not present

## 2023-11-15 DIAGNOSIS — H401134 Primary open-angle glaucoma, bilateral, indeterminate stage: Secondary | ICD-10-CM | POA: Diagnosis not present

## 2023-11-16 NOTE — Progress Notes (Signed)
 Remote pacemaker transmission.

## 2023-11-28 ENCOUNTER — Telehealth: Payer: Self-pay | Admitting: Cardiovascular Disease

## 2023-11-28 NOTE — Telephone Encounter (Signed)
 Pt c/o Shortness Of Breath: STAT if SOB developed within the last 24 hours or pt is noticeably SOB on the phone  1. Are you currently SOB (can you hear that pt is SOB on the phone)?   No  2. How long have you been experiencing SOB?  A while  3. Are you SOB when sitting or when up moving around?   When up and moving around  4. Are you currently experiencing any other symptoms?   No   Husband Elnora) is concerned patient has been having SOB when up and moving around.

## 2023-11-28 NOTE — Telephone Encounter (Signed)
 Spoke to patient's husband he stated he is concerned wife has been having sob when she walks.She has gained weight.She use to weigh 97 lbs.Now she weighs 104 lbs.She has swelling in both lower legs.Stated swelling no worse since she was discharged from hospital.Appointment scheduled with Josefa Beauvais NP 8/21 at 2:45 pm.Advised if sob worsens she will need to go to ED.

## 2023-12-04 ENCOUNTER — Ambulatory Visit: Payer: Medicare Other | Admitting: Adult Health

## 2023-12-13 NOTE — Progress Notes (Unsigned)
 Cardiology Clinic Note   Patient Name: Mary Mercado Date of Encounter: 12/14/2023  Primary Care Provider:  Johnny Garnette LABOR, MD Primary Cardiologist:  Soyla LABOR Merck, MD  Patient Profile    Mary Mercado 83 year old female presents to the clinic today for evaluation of her shortness of breath.  Past Medical History    Past Medical History:  Diagnosis Date   Allergy    Anxiety    CVA (cerebral vascular accident) (HCC) 05/2018, 01/09/20   Headache    History of kidney stones    Hyperlipidemia LDL goal <70    Hypertension    Pacemaker    Palpitations    Vertigo, benign positional    Past Surgical History:  Procedure Laterality Date   AV NODE ABLATION N/A 03/30/2020   Procedure: AV NODE ABLATION;  Surgeon: Fernande Elspeth BROCKS, MD;  Location: [facility] INVASIVE CV LAB;  Service: Cardiovascular;  Laterality: N/A;   AV NODE ABLATION N/A 03/30/2020   Procedure: AV NODE ABLATION;  Surgeon: Fernande Elspeth BROCKS, MD;  Location: [facility] INVASIVE CV LAB;  Service: Cardiovascular;  Laterality: N/A;   CARDIOVERSION Left 10/31/2018   Procedure: CARDIOVERSION;  Surgeon: Alveta Aleene PARAS, MD;  Location: [facility] ENDOSCOPY;  Service: Cardiovascular;  Laterality: Left;   CARDIOVERSION N/A 01/08/2020   Procedure: CARDIOVERSION;  Surgeon: Merck Soyla LABOR, MD;  Location: [facility] ENDOSCOPY;  Service: Cardiovascular;  Laterality: N/A;   CATARACT EXTRACTION Bilateral    CYSTOSCOPY WITH URETEROSCOPY Bilateral 10/24/2023   Procedure: CYSTOSCOPY WITH URETEROSCOPY, BILATERAL RETROGRADE PYELOGRAM;  Surgeon: Shane Steffan BROCKS, MD;  Location: [facility] ORS;  Service: Urology;  Laterality: Bilateral;  POSSIBLE URETERAL BIOPSY   Hemilaminectomy and microdiskectomy at L4-5 on the left.  12/10/2003   IR CT HEAD LTD  01/24/2023   IR PERCUTANEOUS ART THROMBECTOMY/INFUSION INTRACRANIAL INC DIAG ANGIO  01/24/2023   IR US  GUIDE VASC ACCESS RIGHT  01/24/2023   PACEMAKER IMPLANT N/A 03/30/2020   Procedure: PACEMAKER  IMPLANT;  Surgeon: Fernande Elspeth BROCKS, MD;  Location: [facility] INVASIVE CV LAB;  Service: Cardiovascular;  Laterality: N/A;   RADIOLOGY WITH ANESTHESIA N/A 01/24/2023   Procedure: IR WITH ANESTHESIA;  Surgeon: Radiologist, Medication, MD;  Location: [facility] OR;  Service: Radiology;  Laterality: N/A;   TEE WITHOUT CARDIOVERSION N/A 06/05/2018   Procedure: TRANSESOPHAGEAL ECHOCARDIOGRAM (TEE);  Surgeon: Pietro Redell RAMAN, MD;  Location: [facility] ENDOSCOPY;  Service: Cardiovascular;  Laterality: N/A;  loop    Allergies  Allergies  Allergen Reactions   Duloxetine      Causes Bp elevation.  Increased body jerks, nausea   Acetaminophen  Hives and Rash    Other Reaction(s): Other (See Comments)  rash    History of Present Illness    Mary Mercado has a PMH of essential hypertension, paroxysmal atrial fibrillation, PVCs, CVA, AV heart block, status post PPM, palpitations, anxiety, AV nodal ablation, and hypokalemia.  CHA2DS2-VASc score 5 (age, CVA, female).  She was initially seen by Dr. Alveta 8/20.  She was noted to have a history of rapid atrial fibrillation and was cardioverted 10/30/2018.  She was doing well at that time.  She was still noting some dizziness.  This was constant.  It was not felt to be orthostatic in nature.  She continued to follow-up with cardiology regularly.  She was admitted with CVA 01/09/2020.  Her Eliquis  was transition to Pradaxa .  She underwent cardioversion which was complicated by CVA the following day.  She received PPM placement and followed up with Dr. Alveta 09/02/2020.  She had a seizure in March and was placed on Keppra .  She followed up with Dr. Alveta 09/21/2021.  She presented with a list of questions/concerns.  She was seen in follow-up by Dr. Alveta 09/04/2023.  During that time she reported soreness around her device.  She had lost weight.  Her pacemaker was protruding off of her chest.  She was noted to have lower extremity edema.  This was felt to be related to  her inactivity and her amlodipine .  Her metoprolol  was discontinued.  Her amlodipine  was discontinued.  She was placed on bisoprolol  5 mg.  She presents to the clinic today for an evaluation of her shortness of breath.  She states she has had worsening chest discomfort and notices a fullness with breathing.  This is increased over the last month.  We reviewed her previous clinic visit and medications.  She does have bilateral lower extremity 1-2+ pitting edema to her mid shin.  She denies orthopnea.  She is fairly sedentary and is able to do light tasks like folding close and activities of daily living.  Her blood pressure today is initially 142/90 and on recheck is 132/86.  I will order a chest x-ray and complete echocardiogram.  Will plan follow-up in 2 to 3 months.  Her husband also reports that they have to get approval to have reduced cost for their Pradaxa .  Previously Dr. Alveta was signed for this.  I let them know that we would be happy to continue this.  She also notes that she recently had uroscopy for a kidney stone.  They were unable to remove the stone.  They are going to monitor at this time and do not wish to proceed with extraordinary measures.  Today she denies  shortness of breath,  palpitations, melena, hematuria, hemoptysis, diaphoresis, weakness, presyncope, syncope, orthopnea, and PND.   Home Medications    Prior to Admission medications   Medication Sig Start Date End Date Taking? Authorizing Provider  atorvastatin  (LIPITOR ) 80 MG tablet Take 1 tablet (80 mg total) by mouth daily. 10/10/23   Johnny Garnette LABOR, MD  bisoprolol  (ZEBETA ) 5 MG tablet Take 5 mg by mouth daily.    [provider]  dorzolamide -timolol  (COSOPT ) 2-0.5 % ophthalmic solution Place 1 drop into both eyes 2 (two) times daily. 02/26/22   [provider]  latanoprost  (XALATAN ) 0.005 % ophthalmic solution Place 1 drop into both eyes every evening. 09/25/23   [provider]  levETIRAcetam   (KEPPRA ) 500 MG tablet Take 1 tablet (500 mg total) by mouth 2 (two) times daily. 11/08/23   Whitfield Raisin, NP  losartan  (COZAAR ) 50 MG tablet TAKE 2 TABLETS BY MOUTH EVERY DAY 10/09/23   Johnny Garnette LABOR, MD  PRADAXA  150 MG CAPS capsule TAKE 1 CAPSULE BY MOUTH TWICE A DAY 07/10/23   Fernande Elspeth BROCKS, MD    Family History    Family History  Problem Relation Age of Onset   Diabetes Mother    Heart attack Mother        1   Colon cancer Father    Heart attack Brother 66   Sleep apnea Neg Hx    She indicated that her mother is deceased. She indicated that her father is deceased. She indicated that the status of her brother is unknown. She indicated that the status of her neg hx is unknown.  Social History    Social History   Socioeconomic History   Marital status: Married    Spouse  name: [name]   Number of children: Not on file   Years of education: 12+   Highest education level: Not on file  Occupational History    Comment: retired  Tobacco Use   Smoking status: Never   Smokeless tobacco: Never  Vaping Use   Vaping status: Never Used  Substance and Sexual Activity   Alcohol use: Never   Drug use: Never   Sexual activity: Not on file  Other Topics Concern   Not on file  Social History Narrative   Lives with husband   caffeine none daily   Right Handed   Social Drivers of Health   Financial Resource Strain: Low Risk  (06/05/2023)   Overall Financial Resource Strain (CARDIA)    Difficulty of Paying Living Expenses: Not hard at all  Food Insecurity: No Food Insecurity (06/05/2023)   Hunger Vital Sign    Worried About Running Out of Food in the Last Year: Never true    Ran Out of Food in the Last Year: Never true  Transportation Needs: No Transportation Needs (06/05/2023)   PRAPARE - Administrator, Civil Service (Medical): No    Lack of Transportation (Non-Medical): No  Physical Activity: Insufficiently Active (06/05/2023)   Exercise Vital Sign    Days of  Exercise per Week: 3 days    Minutes of Exercise per Session: 30 min  Stress: No Stress Concern Present (06/05/2023)   [employer] of Occupational Health - Occupational Stress Questionnaire    Feeling of Stress : Not at all  Social Connections: Socially Integrated (06/05/2023)   Social Connection and Isolation Panel    Frequency of Communication with Friends and Family: More than three times a week    Frequency of Social Gatherings with Friends and Family: More than three times a week    Attends Religious Services: More than 4 times per year    Active Member of [employer] or Organizations: Yes    Attends Engineer, structural: More than 4 times per year    Marital Status: Married  Catering manager Violence: Not At Risk (06/05/2023)   Humiliation, Afraid, Rape, and Kick questionnaire    Fear of Current or Ex-Partner: No    Emotionally Abused: No    Physically Abused: No    Sexually Abused: No     Review of Systems    General:  No chills, fever, night sweats or weight changes.  Cardiovascular:  No chest pain, dyspnea on exertion, edema, orthopnea, palpitations, paroxysmal nocturnal dyspnea. Dermatological: No rash, lesions/masses Respiratory: No cough, dyspnea Urologic: No hematuria, dysuria Abdominal:   No nausea, vomiting, diarrhea, bright red blood per rectum, melena, or hematemesis Neurologic:  No visual changes, wkns, changes in mental status. All other systems reviewed and are otherwise negative except as noted above.  Physical Exam    VS:  BP 132/86   Pulse 74   Ht 5' 5 (1.651 m)   Wt 100 lb 9.6 oz (45.6 kg)   SpO2 (!) 89%   BMI 16.74 kg/m  , BMI Body mass index is 16.74 kg/m. GEN: Well nourished, well developed, in no acute distress. HEENT: normal. Neck: Supple, no JVD, carotid bruits, or masses. Cardiac: RRR, no murmurs, rubs, or gallops. No clubbing, cyanosis, edema.  Radials/DP/PT 2+ and equal bilaterally.  Respiratory:  Respirations regular and  unlabored, clear to auscultation bilaterally. GI: Soft, nontender, nondistended, BS + x 4. MS: no deformity or atrophy. Skin: warm and dry, no rash. Neuro:  Strength and  sensation are intact. Psych: Normal affect.  Accessory Clinical Findings    Recent Labs: 05/01/2023: ALT 26 05/05/2023: TSH 3.58 10/11/2023: BUN 21; Creatinine, Ser 0.64; Hemoglobin 14.7; Platelets 175; Potassium 4.1; Sodium 137   Recent Lipid Panel    Component Value Date/Time   CHOL 153 01/25/2023 0818   CHOL 161 09/21/2021 1600   TRIG 125 01/25/2023 0819   HDL 51 01/25/2023 0818   HDL 84 09/21/2021 1600   CHOLHDL 3.0 01/25/2023 0818   VLDL 18 01/25/2023 0818   LDLCALC 84 01/25/2023 0818   LDLCALC 67 09/21/2021 1600   LDLCALC 90 11/14/2019 1102         ECG personally reviewed by me today-none today.     Echocardiogram 01/25/23  IMPRESSIONS     1. Left ventricular ejection fraction, by estimation, is 60 to 65%. The  left ventricle has normal function. The left ventricle has no regional  wall motion abnormalities. Left ventricular diastolic function could not  be evaluated.   2. Right ventricular systolic function is hyperdynamic. The right  ventricular size is mildly enlarged. There is moderately elevated  pulmonary artery systolic pressure. The estimated right ventricular  systolic pressure is 48.4 mmHg.   3. Left atrial size was severely dilated.   4. Right atrial size was severely dilated.   5. A small pericardial effusion is present. The pericardial effusion is  circumferential.   6. The mitral valve is normal in structure. Trivial mitral valve  regurgitation. No evidence of mitral stenosis.   7. Tricuspid valve regurgitation is severe.   8. The aortic valve is tricuspid. There is mild calcification of the  aortic valve. There is mild thickening of the aortic valve. Aortic valve  regurgitation is mild. Aortic valve sclerosis/calcification is present,  without any evidence of aortic   stenosis.   9. The inferior vena cava is dilated in size with <50% respiratory  variability, suggesting right atrial pressure of 15 mmHg.   Comparison(s): No significant change from prior study. Prior images  reviewed side by side.   FINDINGS   Left Ventricle: Left ventricular ejection fraction, by estimation, is 60  to 65%. The left ventricle has normal function. The left ventricle has no  regional wall motion abnormalities. The left ventricular internal cavity  size was normal in size. There is   no left ventricular hypertrophy. Left ventricular diastolic function  could not be evaluated due to atrial fibrillation. Left ventricular  diastolic function could not be evaluated.   Right Ventricle: The right ventricular size is mildly enlarged. No  increase in right ventricular wall thickness. Right ventricular systolic  function is hyperdynamic. There is moderately elevated pulmonary artery  systolic pressure. The tricuspid  regurgitant velocity is 2.89 m/s, and with an assumed right atrial  pressure of 15 mmHg, the estimated right ventricular systolic pressure is  48.4 mmHg.   Left Atrium: Left atrial size was severely dilated.   Right Atrium: Right atrial size was severely dilated.   Pericardium: A small pericardial effusion is present. The pericardial  effusion is circumferential.   Mitral Valve: The mitral valve is normal in structure. Mild to moderate  mitral annular calcification. Trivial mitral valve regurgitation. No  evidence of mitral valve stenosis.   Tricuspid Valve: The tricuspid valve is normal in structure. Tricuspid  valve regurgitation is severe.   Aortic Valve: The noncoronary cusp is severely calcified and has severely  restricted motion. The aortic valve is tricuspid. There is mild  calcification of the aortic  valve. There is mild thickening of the aortic  valve. Aortic valve regurgitation is mild.   Aortic regurgitation PHT measures 715 msec. Aortic  valve  sclerosis/calcification is present, without any evidence of aortic  stenosis.   Pulmonic Valve: The pulmonic valve was normal in structure. Pulmonic valve  regurgitation is not visualized. No evidence of pulmonic stenosis.   Aorta: The aortic root and ascending aorta are structurally normal, with  no evidence of dilitation.   Venous: The inferior vena cava is dilated in size with less than 50%  respiratory variability, suggesting right atrial pressure of 15 mmHg.   IAS/Shunts: No atrial level shunt detected by color flow Doppler.    Assessment & Plan   1.  DOE, chest discomfort-reports increased work of breathing over the past month. Fairly sedentary.  Lower extremity support stockings Elevate lower extremities when not active Heart healthy low-sodium diet Order echocardiogram Chest X-ray  Atrial fibrillation-heart rate today 74 bpm.  She is status post AV nodal ablation and status post PPM.  Previously noted vibration type sensation with pacemaker and had adjustments made with EP.  Compliant with Pradaxa .  Denies bleeding issues.  Today she denies accelerated or irregular heartbeats.  Device interrogated 09/28/2023.  V paced.  Battery and lead parameters stable.  Programming appropriate.  Remote monitoring was continued. Continue bisoprolol , Pradaxa  Avoid triggers caffeine, chocolate, EtOH, dehydration etc.  Essential hypertension-BP today 132/86. Maintain blood pressure log Low-sodium diet Continue metoprolol , losartan   History of CVA-neurologically intact.  Previously was on Eliquis  and transition to Pradaxa . Follows with PCP  Disposition: Follow-up with Dr. Loni or me in 2-3 months.   Josefa HERO. Maicie Vanderloop NP-C     12/14/2023, 3:26 PM [facility] [address] [place], [place] [address] Office [phone]      I spent 14 minutes examining this patient, reviewing medications, and using patient centered shared decision  making involving their cardiac care.   I spent  20 minutes reviewing past medical history,  medications, and prior cardiac tests.

## 2023-12-14 ENCOUNTER — Encounter: Payer: Self-pay | Admitting: General Practice

## 2023-12-14 ENCOUNTER — Ambulatory Visit (HOSPITAL_COMMUNITY)
Admission: RE | Admit: 2023-12-14 | Discharge: 2023-12-14 | Disposition: A | Discharge status: Home / Self Care | Source: Ambulatory Visit | Attending: General Practice | Admitting: General Practice

## 2023-12-14 ENCOUNTER — Ambulatory Visit: Admitting: General Practice

## 2023-12-14 VITALS — BP 132/86 | HR 74 | Ht 65.0 in | Wt 100.6 lb

## 2023-12-14 DIAGNOSIS — I4821 Permanent atrial fibrillation: Secondary | ICD-10-CM

## 2023-12-14 DIAGNOSIS — R0609 Other forms of dyspnea: Secondary | ICD-10-CM | POA: Diagnosis not present

## 2023-12-14 DIAGNOSIS — R0789 Other chest pain: Secondary | ICD-10-CM | POA: Diagnosis not present

## 2023-12-14 DIAGNOSIS — I639 Cerebral infarction, unspecified: Secondary | ICD-10-CM | POA: Diagnosis not present

## 2023-12-14 DIAGNOSIS — J90 Pleural effusion, not elsewhere classified: Secondary | ICD-10-CM | POA: Diagnosis not present

## 2023-12-14 DIAGNOSIS — Z9889 Other specified postprocedural states: Secondary | ICD-10-CM | POA: Diagnosis not present

## 2023-12-14 DIAGNOSIS — I10 Essential (primary) hypertension: Secondary | ICD-10-CM | POA: Diagnosis not present

## 2023-12-14 DIAGNOSIS — Z950 Presence of cardiac pacemaker: Secondary | ICD-10-CM | POA: Diagnosis not present

## 2023-12-14 DIAGNOSIS — J9811 Atelectasis: Secondary | ICD-10-CM | POA: Diagnosis not present

## 2023-12-14 DIAGNOSIS — R079 Chest pain, unspecified: Secondary | ICD-10-CM | POA: Diagnosis not present

## 2023-12-14 NOTE — Patient Instructions (Signed)
 Medication Instructions:  Your physician recommends that you continue on your current medications as directed. Please refer to the Current Medication list given to you today.  *If you need a refill on your cardiac medications before your next appointment, please call your pharmacy*  Lab Work: NONE If you have labs (blood work) drawn today and your tests are completely normal, you will receive your results only by: MyChart Message (if you have MyChart) OR A paper copy in the mail If you have any lab test that is abnormal or we need to change your treatment, we will call you to review the results.  Testing/Procedures: Your physician has requested that you have an echocardiogram. Echocardiography is a painless test that uses sound waves to create images of your heart. It provides your doctor with information about the size and shape of your heart and how well your heart's chambers and valves are working. This procedure takes approximately one hour. There are no restrictions for this procedure. Please do NOT wear cologne, perfume, aftershave, or lotions (deodorant is allowed). Please arrive 15 minutes prior to your appointment time.  Please note: We ask at that you not bring children with you during ultrasound (echo/ vascular) testing. Due to room size and safety concerns, children are not allowed in the ultrasound rooms during exams. Our front office staff cannot provide observation of children in our lobby area while testing is being conducted. An adult accompanying a patient to their appointment will only be allowed in the ultrasound room at the discretion of the ultrasound technician under special circumstances. We apologize for any inconvenience.   A chest x-ray takes a picture of the organs and structures inside the chest, including the heart, lungs, and blood vessels. This test can show several things, including, whether the heart is enlarges; whether fluid is building up in the lungs; and  whether pacemaker / defibrillator leads are still in place.   Follow-Up: At [facility], you and your health needs are our priority.  As part of our continuing mission to provide you with exceptional heart care, our providers are all part of one team.  This team includes your primary Cardiologist (physician) and Advanced Practice Providers or APPs (Physician Assistants and Nurse Practitioners) who all work together to provide you with the care you need, when you need it.  Your next appointment:   2-3 month(s)  Provider:   Josefa Beauvais, NP      Then, Soyla DELENA Merck, MD will plan to see you again in 6 month(s).   We recommend signing up for the patient portal called MyChart.  Sign up information is provided on this After Visit Summary.  MyChart is used to connect with patients for Virtual Visits (Telemedicine).  Patients are able to view lab/test results, encounter notes, upcoming appointments, etc.  Non-urgent messages can be sent to your provider as well.   To learn more about what you can do with MyChart, go to [email or web address].   Other Instructions [employer]  Outlet Store  Training and development officer for [employer] Mailing Address:  [address];   [address]  [place], [place] [address]  Tel [phone] Fx [phone]     High Quality Legwear for Today's Active Lifestyles Maximum Compression at the ankle. Compression lessens gradually up the leg.   We manufacture a wide range of compression hosiery for men and women in  different styles, constructions and levels of support.  How Compression Hosiery Works Regulatory affairs officer[employer]  [employer] compression hosiery works by applying graduated pressure to the  muscles and veins in the legs.  When the calf muscle contracts such as during walking  the compression hosiery will "give" and then return to its original position. By doing so  the hosiery is assists your body's circulatory wellness.  The result is  increased leg health and vitality.   Maximum Compression at the ankle Compression lessens gradually up the leg  We Offer: Sheer & Opaque Stockings       COLORS:  Nude, black, white and misc. prints Below Knee Thigh High Pantyhose  High Quality Legwear for Today's Active Lifestyles We manufacture a wide range of compression hosiery for men and women in different styles, construction sand levels of support.  Socks:                     Sheer & Opaque   Compression Levels Include:                                  Stockings Men's               Below Knee                8-15 mmHg   Women's         Thigh High                 15-20 mmHg  Unisex             Pantyhose                 20-30 mmHg                                                                      30-40 mmHg  4 Simple Ways to Order   Email  [email or web address] Mail/Email orders are subject to processing and handling charges. Allow 7-10 days for receipt.  Phone [phone]  Please allow 24 hours for return call.   In Person  We recommend calling prior to your visit to confirm store hours as they may change due to holiday, weather, and maintenance.   By Mail When placing an order, please have the following information available. Our representatives are available to assist.     Measurements    THIGH      in.   CALF        in.   ANKLE     in.    Compression  8-15 mmHg 15-20 mmHg**   20-30 mmHg 30-40 mmHg   WOMEN'S MEN'S  Shoe Size Sock Size Shoe Size Sock Size  4 - 5 Small 7.5 and Under Small  5.5 - 7.5 Medium 8 - 10 Medium  8 - 10 Large 10.5 - 12 Large  10.5 and Over X-Large 12.5 and Over X-Large   Knee High Size Chart  Length from CALF MEASUREMENT  floor to bend   in knee. 11 12 13 14 15 16 17 18 19 20  21" 22"  14 S S S S M M L L L XL XL XL  15 S S S M M L L L XL  XL XXL XXL  16 S S M M M L L L XL XXL XXL XXL  17 S M M M M L L XL XL XXL XXL XXL  18 M M M M L L L XL XL XXL XXL XXL  19 M M  M M L L XL XL XL XXL XXL XXL   Thigh High Circumference Sizing Chart                 S M L XL XXL  ANKLE 6.5 - 8 8 - 9.5 9.5 - 11 11 - 12.5 12.5 - 14  CALF 10.5 - 14.5 11.5 - 15.5 12.5 - 17 13.5 - 17.5 14.5 - 19.5  THIGH 15.5 - 22 17.5 - 24 19.5 - 26 22 - 28 26 - 32  HIP UP TO 40 UP TO 44 UP TO 48' UP TO 52 UP TO 56   Pantyhose Size Chart  Height Petite Medium Tall X-Tall Queen Queen +   Weight Weight Weight Weight Weight Weight  4'11 95-130 135      5'0 95-125 130-145   170-185   5'1 90-120 125-155 160-165  170-195   5'2 90-115 120-145 150-165  170-195   5'3 90-110 115-140 145-165  170-200 200-225  5'4 100-105 110-135 140-160 165 170-200 200-225  5'5 100 105-130 135-160 165 170-200 200-225  5'6  110-125 130-155 160-165 170-200 200-225  5'7  110-120 125-150 155-165 170-200 195-225  5'8   120-145 150-165 170-200 190-225  5'9   125-140 145-170 175-190 185-220  5'10   125-135 140-185  185-215  5'11   130-135 140-185  190-210

## 2023-12-21 ENCOUNTER — Ambulatory Visit: Payer: Self-pay | Admitting: General Practice

## 2023-12-28 ENCOUNTER — Ambulatory Visit: Payer: Medicare Other

## 2023-12-28 DIAGNOSIS — I4821 Permanent atrial fibrillation: Secondary | ICD-10-CM | POA: Diagnosis not present

## 2023-12-29 ENCOUNTER — Ambulatory Visit: Payer: Self-pay | Admitting: Cardiology

## 2023-12-29 ENCOUNTER — Telehealth: Payer: Self-pay | Admitting: Internal Medicine

## 2023-12-29 DIAGNOSIS — I4821 Permanent atrial fibrillation: Secondary | ICD-10-CM

## 2023-12-29 DIAGNOSIS — I480 Paroxysmal atrial fibrillation: Secondary | ICD-10-CM

## 2023-12-29 LAB — CUP PACEART REMOTE DEVICE CHECK
Battery Remaining Longevity: 109 mo
Battery Voltage: 3 V
Brady Statistic AP VP Percent: 0 %
Brady Statistic AP VS Percent: 0 %
Brady Statistic AS VP Percent: 99.9 %
Brady Statistic AS VS Percent: 0.1 %
Brady Statistic RA Percent Paced: 0 %
Brady Statistic RV Percent Paced: 99.9 %
Date Time Interrogation Session: 20250904062947
Implantable Lead Connection Status: 753985
Implantable Lead Connection Status: 753985
Implantable Lead Implant Date: 20211206
Implantable Lead Implant Date: 20211206
Implantable Lead Location: 753858
Implantable Lead Location: 753860
Implantable Lead Model: 3830
Implantable Lead Model: 5076
Implantable Pulse Generator Implant Date: 20211206
Lead Channel Impedance Value: 304 Ohm
Lead Channel Impedance Value: 342 Ohm
Lead Channel Impedance Value: 399 Ohm
Lead Channel Impedance Value: 494 Ohm
Lead Channel Pacing Threshold Amplitude: 0.75 V
Lead Channel Pacing Threshold Pulse Width: 0.4 ms
Lead Channel Sensing Intrinsic Amplitude: 4.875 mV
Lead Channel Sensing Intrinsic Amplitude: 4.875 mV
Lead Channel Sensing Intrinsic Amplitude: 7.375 mV
Lead Channel Sensing Intrinsic Amplitude: 7.375 mV
Lead Channel Setting Pacing Amplitude: 2 V
Lead Channel Setting Pacing Pulse Width: 0.4 ms
Lead Channel Setting Sensing Sensitivity: 2 mV
Zone Setting Status: 755011

## 2023-12-29 MED ORDER — DABIGATRAN ETEXILATE MESYLATE 150 MG PO CAPS: 150.0000 mg | ORAL_CAPSULE | Freq: Two times a day (BID) | ORAL | 1 refills | Status: AC

## 2023-12-29 NOTE — Telephone Encounter (Signed)
*  STAT* If patient is at the pharmacy, call can be transferred to refill team.   1. Which medications need to be refilled? (please list name of each medication and dose if known) PRADAXA  150 MG CAPS capsule  2. Which pharmacy/location (including street and city if local pharmacy) is medication to be sent to? [id number] - [place], [place] - [address]    3. Do they need a 30 day or 90 day supply?   90 day supply

## 2024-01-06 NOTE — Progress Notes (Signed)
 Remote PPM Transmission

## 2024-01-10 NOTE — Telephone Encounter (Signed)
 Noted

## 2024-01-17 ENCOUNTER — Ambulatory Visit (HOSPITAL_COMMUNITY)
Admission: RE | Admit: 2024-01-17 | Discharge: 2024-01-17 | Disposition: A | Discharge status: Home / Self Care | Source: Ambulatory Visit | Attending: Cardiovascular Disease | Admitting: Cardiovascular Disease

## 2024-01-17 DIAGNOSIS — R0609 Other forms of dyspnea: Secondary | ICD-10-CM | POA: Diagnosis not present

## 2024-01-17 DIAGNOSIS — R0789 Other chest pain: Secondary | ICD-10-CM | POA: Diagnosis not present

## 2024-01-17 LAB — ECHOCARDIOGRAM COMPLETE
Area-P 1/2: 5.54 cm2
P 1/2 time: 561 ms
S' Lateral: 2.1 cm

## 2024-01-29 DIAGNOSIS — H401134 Primary open-angle glaucoma, bilateral, indeterminate stage: Secondary | ICD-10-CM | POA: Diagnosis not present

## 2024-02-06 DIAGNOSIS — L570 Actinic keratosis: Secondary | ICD-10-CM | POA: Diagnosis not present

## 2024-02-06 DIAGNOSIS — D0462 Carcinoma in situ of skin of left upper limb, including shoulder: Secondary | ICD-10-CM | POA: Diagnosis not present

## 2024-02-11 NOTE — Progress Notes (Unsigned)
 Cardiology Clinic Note   Patient Name: Mary Mercado Date of Encounter: 02/13/2024  Primary Care Provider:  Johnny Garnette LABOR, MD Primary Cardiologist:  Soyla LABOR Merck, MD  Patient Profile    Mary Mercado 83 year old female presents to the clinic today for evaluation of her DOE, paroxysmal atrial fibrillation and hypertension.  Past Medical History    Past Medical History:  Diagnosis Date   Allergy    Anxiety    CVA (cerebral vascular accident) (HCC) 05/2018, 01/09/20   Headache    History of kidney stones    Hyperlipidemia LDL goal <70    Hypertension    Pacemaker    Palpitations    Vertigo, benign positional    Past Surgical History:  Procedure Laterality Date   AV NODE ABLATION N/A 03/30/2020   Procedure: AV NODE ABLATION;  Surgeon: Fernande Elspeth BROCKS, MD;  Location: [facility] INVASIVE CV LAB;  Service: Cardiovascular;  Laterality: N/A;   AV NODE ABLATION N/A 03/30/2020   Procedure: AV NODE ABLATION;  Surgeon: Fernande Elspeth BROCKS, MD;  Location: [facility] INVASIVE CV LAB;  Service: Cardiovascular;  Laterality: N/A;   CARDIOVERSION Left 10/31/2018   Procedure: CARDIOVERSION;  Surgeon: Alveta Aleene PARAS, MD;  Location: [facility] ENDOSCOPY;  Service: Cardiovascular;  Laterality: Left;   CARDIOVERSION N/A 01/08/2020   Procedure: CARDIOVERSION;  Surgeon: Merck Soyla LABOR, MD;  Location: [facility] ENDOSCOPY;  Service: Cardiovascular;  Laterality: N/A;   CATARACT EXTRACTION Bilateral    CYSTOSCOPY WITH URETEROSCOPY Bilateral 10/24/2023   Procedure: CYSTOSCOPY WITH URETEROSCOPY, BILATERAL RETROGRADE PYELOGRAM;  Surgeon: Shane Steffan BROCKS, MD;  Location: [facility] ORS;  Service: Urology;  Laterality: Bilateral;  POSSIBLE URETERAL BIOPSY   Hemilaminectomy and microdiskectomy at L4-5 on the left.  12/10/2003   IR CT HEAD LTD  01/24/2023   IR PERCUTANEOUS ART THROMBECTOMY/INFUSION INTRACRANIAL INC DIAG ANGIO  01/24/2023   IR US  GUIDE VASC ACCESS RIGHT  01/24/2023   PACEMAKER IMPLANT N/A  03/30/2020   Procedure: PACEMAKER IMPLANT;  Surgeon: Fernande Elspeth BROCKS, MD;  Location: [facility] INVASIVE CV LAB;  Service: Cardiovascular;  Laterality: N/A;   RADIOLOGY WITH ANESTHESIA N/A 01/24/2023   Procedure: IR WITH ANESTHESIA;  Surgeon: Radiologist, Medication, MD;  Location: [facility] OR;  Service: Radiology;  Laterality: N/A;   TEE WITHOUT CARDIOVERSION N/A 06/05/2018   Procedure: TRANSESOPHAGEAL ECHOCARDIOGRAM (TEE);  Surgeon: Pietro Redell RAMAN, MD;  Location: [facility] ENDOSCOPY;  Service: Cardiovascular;  Laterality: N/A;  loop    Allergies  Allergies  Allergen Reactions   Duloxetine      Causes Bp elevation.  Increased body jerks, nausea   Acetaminophen  Hives and Rash    Other Reaction(s): Other (See Comments)  rash    History of Present Illness    Mary Mercado has a PMH of essential hypertension, paroxysmal atrial fibrillation, PVCs, CVA, AV heart block, status post PPM, palpitations, anxiety, AV nodal ablation, and hypokalemia.  CHA2DS2-VASc score 5 (age, CVA, female).  She was initially seen by Dr. Alveta 8/20.  She was noted to have a history of rapid atrial fibrillation and was cardioverted 10/30/2018.  She was doing well at that time.  She was still noting some dizziness.  This was constant.  It was not felt to be orthostatic in nature.  She continued to follow-up with cardiology regularly.  She was admitted with CVA 01/09/2020.  Her Eliquis  was transition to Pradaxa .  She underwent cardioversion which was complicated by CVA the following day.  She received PPM placement and followed up with Dr.  Nahser 09/02/2020.  She had a seizure in March and was placed on Keppra .  She followed up with Dr. Alveta 09/21/2021.  She presented with a list of questions/concerns.  She was seen in follow-up by Dr. Alveta 09/04/2023.  During that time she reported soreness around her device.  She had lost weight.  Her pacemaker was protruding off of her chest.  She was noted to have lower extremity  edema.  This was felt to be related to her inactivity and her amlodipine .  Her metoprolol  was discontinued.  Her amlodipine  was discontinued.  She was placed on bisoprolol  5 mg.  She presented to the clinic 12/14/23 for an evaluation of her shortness of breath.  She stated she  had worsening chest discomfort and notices a fullness with breathing.  This was increased over the last month.  We reviewed her previous clinic visit and medications.  She did have bilateral lower extremity 1-2+ pitting edema to her mid shin.  She denied orthopnea.  She was fairly sedentary and was able to do light tasks like folding close and activities of daily living.  Her blood pressure today was initially 142/90 and on recheck was 132/86.  I will ordered a chest x-ray and complete echocardiogram.  I planned follow-up in 2 to 3 months.  Her husband also reported that they had to get approval to have reduced cost for their Pradaxa .  Previously Dr. Alveta had signed for this.  I let them know that we would be happy to continue this.  She also noted that she recently had uroscopy for a kidney stone.  They were unable to remove the stone.  They are going to monitor at this time and did not wish to proceed with extraordinary measures.  Chest x-ray showed small right pleural effusion.  Echocardiogram 01/17/2024 showed LVEF of 60-65%, normal diastolic parameters, severely dilated right atria, severely dilated left atria, small pericardial effusion, mild mitral valve regurgitation, moderate calcification of the aortic valve with moderate aortic valve regurgitation.  No stenosis present  She presents to the clinic today for follow-up evaluation and states she continues to note some increased work of breathing with increased physical activity.  On exam she has bilateral ankle edema to her mid shin.  This is generalized to 1+ pitting.  We reviewed her chest x-ray and September echocardiogram.  She and her husband expressed understanding.  Her  blood pressure today is initially 134/90 and on recheck it is 130/72.  I will start furosemide  20 mg Monday Wednesday Friday and also prescribe 10 mill equivalents of potassium to be taken Monday Wednesday Friday.  We will repeat her BMP in 1 to 2 weeks and plan follow-up in 2 to 3 months.  Today she denies  shortness of breath,  palpitations, melena, hematuria, hemoptysis, diaphoresis, weakness, presyncope, syncope, orthopnea, and PND.   Home Medications    Prior to Admission medications   Medication Sig Start Date End Date Taking? Authorizing Provider  atorvastatin  (LIPITOR ) 80 MG tablet Take 1 tablet (80 mg total) by mouth daily. 10/10/23   Johnny Garnette LABOR, MD  bisoprolol  (ZEBETA ) 5 MG tablet Take 5 mg by mouth daily.    [provider]  dorzolamide -timolol  (COSOPT ) 2-0.5 % ophthalmic solution Place 1 drop into both eyes 2 (two) times daily. 02/26/22   [provider]  latanoprost  (XALATAN ) 0.005 % ophthalmic solution Place 1 drop into both eyes every evening. 09/25/23   [provider]  levETIRAcetam  (KEPPRA ) 500 MG tablet Take 1  tablet (500 mg total) by mouth 2 (two) times daily. 11/08/23   Whitfield Raisin, NP  losartan  (COZAAR ) 50 MG tablet TAKE 2 TABLETS BY MOUTH EVERY DAY 10/09/23   Johnny Garnette LABOR, MD  PRADAXA  150 MG CAPS capsule TAKE 1 CAPSULE BY MOUTH TWICE A DAY 07/10/23   Fernande Elspeth BROCKS, MD    Family History    Family History  Problem Relation Age of Onset   Diabetes Mother    Heart attack Mother        94   Colon cancer Father    Heart attack Brother 85   Sleep apnea Neg Hx    She indicated that her mother is deceased. She indicated that her father is deceased. She indicated that the status of her brother is unknown. She indicated that the status of her neg hx is unknown.  Social History    Social History   Socioeconomic History   Marital status: Married    Spouse name: [name]   Number of children: Not on file   Years of education: 12+    Highest education level: Not on file  Occupational History    Comment: retired  Tobacco Use   Smoking status: Never   Smokeless tobacco: Never  Vaping Use   Vaping status: Never Used  Substance and Sexual Activity   Alcohol use: Never   Drug use: Never   Sexual activity: Not on file  Other Topics Concern   Not on file  Social History Narrative   Lives with husband   caffeine none daily   Right Handed   Social Drivers of Health   Financial Resource Strain: Low Risk  (06/05/2023)   Overall Financial Resource Strain (CARDIA)    Difficulty of Paying Living Expenses: Not hard at all  Food Insecurity: No Food Insecurity (06/05/2023)   Hunger Vital Sign    Worried About Running Out of Food in the Last Year: Never true    Ran Out of Food in the Last Year: Never true  Transportation Needs: No Transportation Needs (06/05/2023)   PRAPARE - Administrator, Civil Service (Medical): No    Lack of Transportation (Non-Medical): No  Physical Activity: Insufficiently Active (06/05/2023)   Exercise Vital Sign    Days of Exercise per Week: 3 days    Minutes of Exercise per Session: 30 min  Stress: No Stress Concern Present (06/05/2023)   [employer] of Occupational Health - Occupational Stress Questionnaire    Feeling of Stress : Not at all  Social Connections: Socially Integrated (06/05/2023)   Social Connection and Isolation Panel    Frequency of Communication with Friends and Family: More than three times a week    Frequency of Social Gatherings with Friends and Family: More than three times a week    Attends Religious Services: More than 4 times per year    Active Member of [employer] or Organizations: Yes    Attends Engineer, structural: More than 4 times per year    Marital Status: Married  Catering manager Violence: Not At Risk (06/05/2023)   Humiliation, Afraid, Rape, and Kick questionnaire    Fear of Current or Ex-Partner: No    Emotionally Abused: No     Physically Abused: No    Sexually Abused: No     Review of Systems    General:  No chills, fever, night sweats or weight changes.  Cardiovascular:  No chest pain, dyspnea on exertion, edema, orthopnea, palpitations, paroxysmal nocturnal dyspnea.  Dermatological: No rash, lesions/masses Respiratory: No cough, dyspnea Urologic: No hematuria, dysuria Abdominal:   No nausea, vomiting, diarrhea, bright red blood per rectum, melena, or hematemesis Neurologic:  No visual changes, wkns, changes in mental status. All other systems reviewed and are otherwise negative except as noted above.  Physical Exam    VS:  BP 130/72   Pulse 71   Wt 98 lb (44.5 kg)   SpO2 90%   BMI 16.31 kg/m  , BMI Body mass index is 16.31 kg/m. GEN: Well nourished, well developed, in no acute distress. HEENT: normal. Neck: Supple, no JVD, carotid bruits, or masses. Cardiac: RRR, 3/6 systolic murmur heard along right sternal border, rubs, or gallops. No clubbing, cyanosis, edema.  Radials/DP/PT 2+ and equal bilaterally.  Respiratory:  Respirations regular and unlabored, clear to auscultation bilaterally. GI: Soft, nontender, nondistended, BS + x 4. MS: no deformity or atrophy. Skin: warm and dry, no rash. Neuro:  Strength and sensation are intact. Psych: Normal affect.  Accessory Clinical Findings    Recent Labs: 05/01/2023: ALT 26 05/05/2023: TSH 3.58 10/11/2023: BUN 21; Creatinine, Ser 0.64; Hemoglobin 14.7; Platelets 175; Potassium 4.1; Sodium 137   Recent Lipid Panel    Component Value Date/Time   CHOL 153 01/25/2023 0818   CHOL 161 09/21/2021 1600   TRIG 125 01/25/2023 0819   HDL 51 01/25/2023 0818   HDL 84 09/21/2021 1600   CHOLHDL 3.0 01/25/2023 0818   VLDL 18 01/25/2023 0818   LDLCALC 84 01/25/2023 0818   LDLCALC 67 09/21/2021 1600   LDLCALC 90 11/14/2019 1102         ECG personally reviewed by me today-none today.EKG Interpretation Date/Time:  Tuesday February 13 2024 13:13:10  EDT Ventricular Rate:  71 PR Interval:    QRS Duration:  126 QT Interval:  446 QTC Calculation: 484 R Axis:   -33  Text Interpretation: Ventricular-paced rhythm When compared with ECG of 01-May-2023 13:43, No significant change was found Confirmed by Emelia Hazy [phone]) on 02/13/2024 1:14:27 PM    Echocardiogram 01/25/23  IMPRESSIONS     1. Left ventricular ejection fraction, by estimation, is 60 to 65%. The  left ventricle has normal function. The left ventricle has no regional  wall motion abnormalities. Left ventricular diastolic function could not  be evaluated.   2. Right ventricular systolic function is hyperdynamic. The right  ventricular size is mildly enlarged. There is moderately elevated  pulmonary artery systolic pressure. The estimated right ventricular  systolic pressure is 48.4 mmHg.   3. Left atrial size was severely dilated.   4. Right atrial size was severely dilated.   5. A small pericardial effusion is present. The pericardial effusion is  circumferential.   6. The mitral valve is normal in structure. Trivial mitral valve  regurgitation. No evidence of mitral stenosis.   7. Tricuspid valve regurgitation is severe.   8. The aortic valve is tricuspid. There is mild calcification of the  aortic valve. There is mild thickening of the aortic valve. Aortic valve  regurgitation is mild. Aortic valve sclerosis/calcification is present,  without any evidence of aortic  stenosis.   9. The inferior vena cava is dilated in size with <50% respiratory  variability, suggesting right atrial pressure of 15 mmHg.   Comparison(s): No significant change from prior study. Prior images  reviewed side by side.   FINDINGS   Left Ventricle: Left ventricular ejection fraction, by estimation, is 60  to 65%. The left ventricle has normal function. The left  ventricle has no  regional wall motion abnormalities. The left ventricular internal cavity  size was normal in size. There  is   no left ventricular hypertrophy. Left ventricular diastolic function  could not be evaluated due to atrial fibrillation. Left ventricular  diastolic function could not be evaluated.   Right Ventricle: The right ventricular size is mildly enlarged. No  increase in right ventricular wall thickness. Right ventricular systolic  function is hyperdynamic. There is moderately elevated pulmonary artery  systolic pressure. The tricuspid  regurgitant velocity is 2.89 m/s, and with an assumed right atrial  pressure of 15 mmHg, the estimated right ventricular systolic pressure is  48.4 mmHg.   Left Atrium: Left atrial size was severely dilated.   Right Atrium: Right atrial size was severely dilated.   Pericardium: A small pericardial effusion is present. The pericardial  effusion is circumferential.   Mitral Valve: The mitral valve is normal in structure. Mild to moderate  mitral annular calcification. Trivial mitral valve regurgitation. No  evidence of mitral valve stenosis.   Tricuspid Valve: The tricuspid valve is normal in structure. Tricuspid  valve regurgitation is severe.   Aortic Valve: The noncoronary cusp is severely calcified and has severely  restricted motion. The aortic valve is tricuspid. There is mild  calcification of the aortic valve. There is mild thickening of the aortic  valve. Aortic valve regurgitation is mild.   Aortic regurgitation PHT measures 715 msec. Aortic valve  sclerosis/calcification is present, without any evidence of aortic  stenosis.   Pulmonic Valve: The pulmonic valve was normal in structure. Pulmonic valve  regurgitation is not visualized. No evidence of pulmonic stenosis.   Aorta: The aortic root and ascending aorta are structurally normal, with  no evidence of dilitation.   Venous: The inferior vena cava is dilated in size with less than 50%  respiratory variability, suggesting right atrial pressure of 15 mmHg.   IAS/Shunts: No atrial  level shunt detected by color flow Doppler.    Echocardiogram 01/17/2024  IMPRESSIONS     1. Left ventricular ejection fraction, by estimation, is 60 to 65%. The  left ventricle has normal function. The left ventricle has no regional  wall motion abnormalities. Left ventricular diastolic parameters were  normal.   2. Right ventricular systolic function is normal. The right ventricular  size is normal. There is mildly elevated pulmonary artery systolic  pressure.   3. Left atrial size was severely dilated.   4. Right atrial size was severely dilated.   5. A small pericardial effusion is present.   6. The mitral valve is normal in structure. Mild mitral valve  regurgitation. No evidence of mitral stenosis. Moderate mitral annular  calcification.   7. Tricuspid valve regurgitation is severe.   8. The aortic valve is tricuspid. There is moderate calcification of the  aortic valve. There is moderate thickening of the aortic valve. Aortic  valve regurgitation is moderate. No aortic stenosis is present. Aortic  regurgitation PHT measures 561 msec.   9. Pulmonic valve regurgitation is moderate.  10. The inferior vena cava is dilated in size with >50% respiratory  variability, suggesting right atrial pressure of 8 mmHg.   FINDINGS   Left Ventricle: Left ventricular ejection fraction, by estimation, is 60  to 65%. The left ventricle has normal function. The left ventricle has no  regional wall motion abnormalities. The left ventricular internal cavity  size was normal in size. There is   no left ventricular hypertrophy. Left ventricular diastolic parameters  were normal. Normal left ventricular filling pressure.   Right Ventricle: The right ventricular size is normal. No increase in  right ventricular wall thickness. Right ventricular systolic function is  normal. There is mildly elevated pulmonary artery systolic pressure. The  tricuspid regurgitant velocity is 2.75   m/s, and with an  assumed right atrial pressure of 8 mmHg, the estimated  right ventricular systolic pressure is 38.2 mmHg.   Left Atrium: Left atrial size was severely dilated.   Right Atrium: Right atrial size was severely dilated.   Pericardium: A small pericardial effusion is present.   Mitral Valve: The mitral valve is normal in structure. Moderate mitral  annular calcification. Mild mitral valve regurgitation. No evidence of  mitral valve stenosis.   Tricuspid Valve: The tricuspid valve is normal in structure. Tricuspid  valve regurgitation is severe. No evidence of tricuspid stenosis.   Aortic Valve: The aortic valve is tricuspid. There is moderate  calcification of the aortic valve. There is moderate thickening of the  aortic valve. Aortic valve regurgitation is moderate. Aortic regurgitation  PHT measures 561 msec. No aortic stenosis is  present.   Pulmonic Valve: The pulmonic valve was normal in structure. Pulmonic valve  regurgitation is moderate. No evidence of pulmonic stenosis.   Aorta: The aortic root is normal in size and structure.   Venous: The inferior vena cava is dilated in size with greater than 50%  respiratory variability, suggesting right atrial pressure of 8 mmHg.   IAS/Shunts: No atrial level shunt detected by color flow Doppler.   Assessment & Plan   1.  DOE, chest discomfort-breathing stable.  Chest x-ray showed small right pleural effusion.  Echocardiogram showed normal LVEF and small pericardial effusion.  She was also noted to have mildly elevated pulmonary artery systolic pressure.  Details above..  Details above. Lower extremity support stockings Elevate lower extremities when not active Heart healthy low-sodium diet-reviewed Increase physical activity as tolerated Start furosemide  20 mg Monday Wednesday Friday Start potassium 10 mill equivalents Monday Wednesday Friday Be met in 1-2 weeks  Atrial fibrillation-heart rate today 71 bpm.  She is status post AV  nodal ablation and status post PPM.  Previously noted vibration type sensation with pacemaker and had adjustments made with EP.  Compliant with Pradaxa .  Denies bleeding issues.  She continues to deny accelerated or irregular heartbeats.  Device interrogated 12/28/2023.  V paced.  Battery and lead parameters stable.  Programming appropriate.   Continue bisoprolol , Pradaxa  Avoid triggers caffeine, chocolate, EtOH, dehydration etc.  Essential hypertension-BP today 130/72 Maintain blood pressure log Low-sodium diet Continue metoprolol , losartan   History of CVA-noted to have pressured speech, poor historian.  Husband assists with history.  Previously was on Eliquis  and transition to Pradaxa . Follows with PCP  Disposition: Follow-up with Dr. Loni or me in 2-3 months.   Mary Mercado. Mary Randleman NP-C     02/13/2024, 1:53 PM [facility] [address] [place], [place] [address] Office [phone]      I spent 14 minutes examining this patient, reviewing medications, and using patient centered shared decision making involving their cardiac care.   I spent  20 minutes reviewing past medical history,  medications, and prior cardiac tests.

## 2024-02-13 ENCOUNTER — Ambulatory Visit: Attending: General Practice | Admitting: General Practice

## 2024-02-13 ENCOUNTER — Encounter: Payer: Self-pay | Admitting: General Practice

## 2024-02-13 VITALS — BP 130/72 | HR 71 | Wt 98.0 lb

## 2024-02-13 DIAGNOSIS — I10 Essential (primary) hypertension: Secondary | ICD-10-CM | POA: Insufficient documentation

## 2024-02-13 DIAGNOSIS — I480 Paroxysmal atrial fibrillation: Secondary | ICD-10-CM | POA: Diagnosis present

## 2024-02-13 DIAGNOSIS — I639 Cerebral infarction, unspecified: Secondary | ICD-10-CM | POA: Insufficient documentation

## 2024-02-13 DIAGNOSIS — R0609 Other forms of dyspnea: Secondary | ICD-10-CM | POA: Insufficient documentation

## 2024-02-13 DIAGNOSIS — R0789 Other chest pain: Secondary | ICD-10-CM | POA: Insufficient documentation

## 2024-02-13 MED ORDER — POTASSIUM CHLORIDE CRYS ER 10 MEQ PO TBCR: EXTENDED_RELEASE_TABLET | ORAL | 3 refills | Status: DC

## 2024-02-13 MED ORDER — FUROSEMIDE 20 MG PO TABS: 20.0000 mg | ORAL_TABLET | Freq: Every day | ORAL | 3 refills | Status: DC

## 2024-02-13 NOTE — Addendum Note (Signed)
 Addended by: MALVINA CHARLENA CROME on: 02/13/2024 05:00 PM   Modules accepted: Orders

## 2024-02-13 NOTE — Patient Instructions (Addendum)
 Thank you for choosing [facility]!     Medication Instructions:  TAKE THE FUROSEMIDE  20MG  ON MONDAY'S, WEDNESDAY'S AND FRIDAY'S ONLY!!! TAKE ONE TABLET DAILY.  TAKE THE POTASSIUM 10 MEQ ON MONDAY'S, WEDNESDAY'S AND FRIDAY'S ONLY  IF YOU LOSE MORE THAN 5LBS, STOP TAKING THE MEDICATION.  *If you need a refill on your cardiac medications before your next appointment, please call your pharmacy*   Lab Work: BMET IN 1-2 WEEKS If you have labs (blood work) drawn today and your tests are completely normal, you will receive your results only by: MyChart Message (if you have MyChart) OR A paper copy in the mail If you have any lab test that is abnormal or we need to change your treatment, we will call you to review the results.   Testing/Procedures: No procedures were ordered during today's visit.   Your next appointment:   2-3 month(s)   Provider:   Soyla DELENA Merck, MD OR JESSE CLEAVER     Follow-Up: At [facility], you and your health needs are our priority.  As part of our continuing mission to provide you with exceptional heart care, we have created designated Provider Care Teams.  These Care Teams include your primary Cardiologist (physician) and Advanced Practice Providers (APPs -  Physician Assistants and Nurse Practitioners) who all work together to provide you with the care you need, when you need it. We recommend signing up for the patient portal called MyChart.  Sign up information is provided on this After Visit Summary.  MyChart is used to connect with patients for Virtual Visits (Telemedicine).  Patients are able to view lab/test results, encounter notes, upcoming appointments, etc.  Non-urgent messages can be sent to your provider as well.   To learn more about what you can do with MyChart, go to [email or web address].

## 2024-02-14 DIAGNOSIS — N3281 Overactive bladder: Secondary | ICD-10-CM | POA: Diagnosis not present

## 2024-02-29 DIAGNOSIS — I480 Paroxysmal atrial fibrillation: Secondary | ICD-10-CM | POA: Diagnosis not present

## 2024-02-29 DIAGNOSIS — I10 Essential (primary) hypertension: Secondary | ICD-10-CM | POA: Diagnosis not present

## 2024-02-29 DIAGNOSIS — I639 Cerebral infarction, unspecified: Secondary | ICD-10-CM | POA: Diagnosis not present

## 2024-02-29 DIAGNOSIS — R0789 Other chest pain: Secondary | ICD-10-CM | POA: Diagnosis not present

## 2024-02-29 DIAGNOSIS — R0609 Other forms of dyspnea: Secondary | ICD-10-CM | POA: Diagnosis not present

## 2024-03-01 ENCOUNTER — Ambulatory Visit: Payer: Self-pay | Admitting: General Practice

## 2024-03-01 LAB — BASIC METABOLIC PANEL WITH GFR
BUN/Creatinine Ratio: 32 — ABNORMAL HIGH (ref 12–28)
BUN: 24 mg/dL (ref 8–27)
CO2: 25 mmol/L (ref 20–29)
Calcium: 9.3 mg/dL (ref 8.7–10.3)
Chloride: 98 mmol/L (ref 96–106)
Creatinine, Ser: 0.76 mg/dL (ref 0.57–1.00)
Glucose: 147 mg/dL — ABNORMAL HIGH (ref 70–99)
Potassium: 3.9 mmol/L (ref 3.5–5.2)
Sodium: 137 mmol/L (ref 134–144)
eGFR: 77 mL/min/1.73 (ref >59)

## 2024-03-01 NOTE — Progress Notes (Signed)
 The patient has been notified of the result and verbalized understanding. All questions (if any) were answered.  Labs has been send to patient PCP.

## 2024-03-04 DIAGNOSIS — H401134 Primary open-angle glaucoma, bilateral, indeterminate stage: Secondary | ICD-10-CM | POA: Diagnosis not present

## 2024-03-07 ENCOUNTER — Ambulatory Visit

## 2024-03-28 ENCOUNTER — Ambulatory Visit: Payer: Medicare Other

## 2024-03-28 DIAGNOSIS — I4821 Permanent atrial fibrillation: Secondary | ICD-10-CM | POA: Diagnosis not present

## 2024-03-28 LAB — CUP PACEART REMOTE DEVICE CHECK
Battery Remaining Longevity: 107 mo
Battery Voltage: 3 V
Brady Statistic AP VP Percent: 0 %
Brady Statistic AP VS Percent: 0 %
Brady Statistic AS VP Percent: 99.94 %
Brady Statistic AS VS Percent: 0.06 %
Brady Statistic RA Percent Paced: 0 %
Brady Statistic RV Percent Paced: 99.94 %
Date Time Interrogation Session: 20251203213043
Implantable Lead Connection Status: 753985
Implantable Lead Connection Status: 753985
Implantable Lead Implant Date: 20211206
Implantable Lead Implant Date: 20211206
Implantable Lead Location: 753858
Implantable Lead Location: 753860
Implantable Lead Model: 3830
Implantable Lead Model: 5076
Implantable Pulse Generator Implant Date: 20211206
Lead Channel Impedance Value: 323 Ohm
Lead Channel Impedance Value: 361 Ohm
Lead Channel Impedance Value: 418 Ohm
Lead Channel Impedance Value: 513 Ohm
Lead Channel Pacing Threshold Amplitude: 1 V
Lead Channel Pacing Threshold Pulse Width: 0.4 ms
Lead Channel Sensing Intrinsic Amplitude: 4.875 mV
Lead Channel Sensing Intrinsic Amplitude: 4.875 mV
Lead Channel Sensing Intrinsic Amplitude: 7.375 mV
Lead Channel Sensing Intrinsic Amplitude: 7.375 mV
Lead Channel Setting Pacing Amplitude: 2 V
Lead Channel Setting Pacing Pulse Width: 0.4 ms
Lead Channel Setting Sensing Sensitivity: 2 mV
Zone Setting Status: 755011

## 2024-03-29 NOTE — Progress Notes (Signed)
 Remote PPM Transmission

## 2024-04-03 ENCOUNTER — Other Ambulatory Visit: Payer: Self-pay | Admitting: Family Medicine

## 2024-04-03 ENCOUNTER — Ambulatory Visit: Payer: Self-pay | Admitting: Cardiology

## 2024-04-10 DIAGNOSIS — Z23 Encounter for immunization: Secondary | ICD-10-CM | POA: Diagnosis not present

## 2024-04-29 NOTE — Progress Notes (Unsigned)
 "   Cardiology Clinic Note   Patient Name: Mary Mercado Date of Encounter: 05/01/2024  Primary Care Provider:  Johnny Garnette LABOR, MD Primary Cardiologist:  Soyla LABOR Merck, MD  Patient Profile    Mary Mercado 84 year old female presents to the clinic today for evaluation of her DOE, paroxysmal atrial fibrillation and hypertension.  Past Medical History    Past Medical History:  Diagnosis Date   Allergy    Anxiety    CVA (cerebral vascular accident) (HCC) 05/2018, 01/09/20   Headache    History of kidney stones    Hyperlipidemia LDL goal <70    Hypertension    Pacemaker    Palpitations    Vertigo, benign positional    Past Surgical History:  Procedure Laterality Date   AV NODE ABLATION N/A 03/30/2020   Procedure: AV NODE ABLATION;  Surgeon: Fernande Elspeth BROCKS, MD;  Location: [facility] INVASIVE CV LAB;  Service: Cardiovascular;  Laterality: N/A;   AV NODE ABLATION N/A 03/30/2020   Procedure: AV NODE ABLATION;  Surgeon: Fernande Elspeth BROCKS, MD;  Location: [facility] INVASIVE CV LAB;  Service: Cardiovascular;  Laterality: N/A;   CARDIOVERSION Left 10/31/2018   Procedure: CARDIOVERSION;  Surgeon: Alveta Aleene PARAS, MD;  Location: [facility] ENDOSCOPY;  Service: Cardiovascular;  Laterality: Left;   CARDIOVERSION N/A 01/08/2020   Procedure: CARDIOVERSION;  Surgeon: Merck Soyla LABOR, MD;  Location: [facility] ENDOSCOPY;  Service: Cardiovascular;  Laterality: N/A;   CATARACT EXTRACTION Bilateral    CYSTOSCOPY WITH URETEROSCOPY Bilateral 10/24/2023   Procedure: CYSTOSCOPY WITH URETEROSCOPY, BILATERAL RETROGRADE PYELOGRAM;  Surgeon: Shane Steffan BROCKS, MD;  Location: [facility] ORS;  Service: Urology;  Laterality: Bilateral;  POSSIBLE URETERAL BIOPSY   Hemilaminectomy and microdiskectomy at L4-5 on the left.  12/10/2003   IR CT HEAD LTD  01/24/2023   IR PERCUTANEOUS ART THROMBECTOMY/INFUSION INTRACRANIAL INC DIAG ANGIO  01/24/2023   IR US  GUIDE VASC ACCESS RIGHT  01/24/2023   PACEMAKER IMPLANT N/A  03/30/2020   Procedure: PACEMAKER IMPLANT;  Surgeon: Fernande Elspeth BROCKS, MD;  Location: [facility] INVASIVE CV LAB;  Service: Cardiovascular;  Laterality: N/A;   RADIOLOGY WITH ANESTHESIA N/A 01/24/2023   Procedure: IR WITH ANESTHESIA;  Surgeon: Radiologist, Medication, MD;  Location: [facility] OR;  Service: Radiology;  Laterality: N/A;   TEE WITHOUT CARDIOVERSION N/A 06/05/2018   Procedure: TRANSESOPHAGEAL ECHOCARDIOGRAM (TEE);  Surgeon: Pietro Redell RAMAN, MD;  Location: [facility] ENDOSCOPY;  Service: Cardiovascular;  Laterality: N/A;  loop    Allergies  Allergies  Allergen Reactions   Duloxetine      Causes Bp elevation.  Increased body jerks, nausea   Acetaminophen  Hives and Rash    Other Reaction(s): Other (See Comments)  rash    History of Present Illness    Mary Mercado has a PMH of essential hypertension, paroxysmal atrial fibrillation, PVCs, CVA, AV heart block, status post PPM, palpitations, anxiety, AV nodal ablation, and hypokalemia.  CHA2DS2-VASc score 5 (age, CVA, female).  She was initially seen by Dr. Alveta 8/20.  She was noted to have a history of rapid atrial fibrillation and was cardioverted 10/30/2018.  She was doing well at that time.  She was still noting some dizziness.  This was constant.  It was not felt to be orthostatic in nature.  She continued to follow-up with cardiology regularly.  She was admitted with CVA 01/09/2020.  Her Eliquis  was transition to Pradaxa .  She underwent cardioversion which was complicated by CVA the following day.  She received PPM placement and followed up with  Dr. Alveta 09/02/2020.  She had a seizure in March and was placed on Keppra .  She followed up with Dr. Alveta 09/21/2021.  She presented with a list of questions/concerns.  She was seen in follow-up by Dr. Alveta 09/04/2023.  During that time she reported soreness around her device.  She had lost weight.  Her pacemaker was protruding off of her chest.  She was noted to have lower extremity  edema.  This was felt to be related to her inactivity and her amlodipine .  Her metoprolol  was discontinued.  Her amlodipine  was discontinued.  She was placed on bisoprolol  5 mg.  She presented to the clinic 12/14/23 for an evaluation of her shortness of breath.  She stated she  had worsening chest discomfort and notices a fullness with breathing.  This was increased over the last month.  We reviewed her previous clinic visit and medications.  She did have bilateral lower extremity 1-2+ pitting edema to her mid shin.  She denied orthopnea.  She was fairly sedentary and was able to do light tasks like folding close and activities of daily living.  Her blood pressure today was initially 142/90 and on recheck was 132/86.  I will ordered a chest x-ray and complete echocardiogram.  I planned follow-up in 2 to 3 months.  Her husband also reported that they had to get approval to have reduced cost for their Pradaxa .  Previously Dr. Alveta had signed for this.  I let them know that we would be happy to continue this.  She also noted that she recently had uroscopy for a kidney stone.  They were unable to remove the stone.  They are going to monitor at this time and did not wish to proceed with extraordinary measures.  Chest x-ray showed small right pleural effusion.  Echocardiogram 01/17/2024 showed LVEF of 60-65%, normal diastolic parameters, severely dilated right atria, severely dilated left atria, small pericardial effusion, mild mitral valve regurgitation, moderate calcification of the aortic valve with moderate aortic valve regurgitation.  No stenosis present  She presented to the clinic 02/13/24 for follow-up evaluation and stated she continued to note some increased work of breathing with increased physical activity.  On exam she had bilateral ankle edema to her mid shin.  This was generalized to 1+ pitting.  We reviewed her chest x-ray and September echocardiogram.  She and her husband expressed understanding.   Her blood pressure was initially 134/90 and on recheck was 130/72.  I  started furosemide  20 mg Monday Wednesday Friday and also prescribed 10 mill equivalents of potassium to be taken Monday Wednesday Friday.  We will repeat her BMP in 1 to 2 weeks and plan follow-up in 2 to 3 months.  Repeat lab work 03/01/2024 showed normal renal function and potassium of 3.9.  She presents to the clinic today for follow-up evaluation and states she has noticed that her breathing has improved and she has had less swelling in her lower extremities with furosemide .  She also has lost some weight.  She does note that she is a little bit more irritable and notices some blurry vision.  She attributes this to Lasix .  I feel that she may have similar benefit with furosemide  dosing 2 days/week.  I will change this from 3 days/week to 2 days/week and have her continue her 10 mill equivalents of potassium with this.  We will plan follow-up in 4 months.  Today she denies  shortness of breath,  palpitations, melena, hematuria, hemoptysis, diaphoresis, weakness,  presyncope, syncope, orthopnea, and PND.   Home Medications    Prior to Admission medications   Medication Sig Start Date End Date Taking? Authorizing Provider  atorvastatin  (LIPITOR ) 80 MG tablet Take 1 tablet (80 mg total) by mouth daily. 10/10/23   Johnny Garnette LABOR, MD  bisoprolol  (ZEBETA ) 5 MG tablet Take 5 mg by mouth daily.    [provider]  dorzolamide -timolol  (COSOPT ) 2-0.5 % ophthalmic solution Place 1 drop into both eyes 2 (two) times daily. 02/26/22   [provider]  latanoprost  (XALATAN ) 0.005 % ophthalmic solution Place 1 drop into both eyes every evening. 09/25/23   [provider]  levETIRAcetam  (KEPPRA ) 500 MG tablet Take 1 tablet (500 mg total) by mouth 2 (two) times daily. 11/08/23   Whitfield Raisin, NP  losartan  (COZAAR ) 50 MG tablet TAKE 2 TABLETS BY MOUTH EVERY DAY 10/09/23   Johnny Garnette LABOR, MD  PRADAXA  150 MG CAPS capsule TAKE  1 CAPSULE BY MOUTH TWICE A DAY 07/10/23   Fernande Elspeth BROCKS, MD    Family History    Family History  Problem Relation Age of Onset   Diabetes Mother    Heart attack Mother        40   Colon cancer Father    Heart attack Brother 54   Sleep apnea Neg Hx    She indicated that her mother is deceased. She indicated that her father is deceased. She indicated that the status of her brother is unknown. She indicated that the status of her neg hx is unknown.  Social History    Social History   Socioeconomic History   Marital status: Married    Spouse name: [name]   Number of children: Not on file   Years of education: 12+   Highest education level: Not on file  Occupational History    Comment: retired  Tobacco Use   Smoking status: Never   Smokeless tobacco: Never  Vaping Use   Vaping status: Never Used  Substance and Sexual Activity   Alcohol use: Never   Drug use: Never   Sexual activity: Not on file  Other Topics Concern   Not on file  Social History Narrative   Lives with husband   caffeine none daily   Right Handed   Social Drivers of Health   Tobacco Use: Low Risk (05/01/2024)   Patient History    Smoking Tobacco Use: Never    Smokeless Tobacco Use: Never    Passive Exposure: Not on file  Financial Resource Strain: Low Risk (06/05/2023)   Overall Financial Resource Strain (CARDIA)    Difficulty of Paying Living Expenses: Not hard at all  Food Insecurity: No Food Insecurity (06/05/2023)   Hunger Vital Sign    Worried About Running Out of Food in the Last Year: Never true    Ran Out of Food in the Last Year: Never true  Transportation Needs: No Transportation Needs (06/05/2023)   PRAPARE - Administrator, Civil Service (Medical): No    Lack of Transportation (Non-Medical): No  Physical Activity: Insufficiently Active (06/05/2023)   Exercise Vital Sign    Days of Exercise per Week: 3 days    Minutes of Exercise per Session: 30 min  Stress: No Stress  Concern Present (06/05/2023)   [employer] of Occupational Health - Occupational Stress Questionnaire    Feeling of Stress : Not at all  Social Connections: Socially Integrated (06/05/2023)   Social Connection and Isolation Panel  Frequency of Communication with Friends and Family: More than three times a week    Frequency of Social Gatherings with Friends and Family: More than three times a week    Attends Religious Services: More than 4 times per year    Active Member of [employer] or Organizations: Yes    Attends Banker Meetings: More than 4 times per year    Marital Status: Married  Catering Manager Violence: Not At Risk (06/05/2023)   Humiliation, Afraid, Rape, and Kick questionnaire    Fear of Current or Ex-Partner: No    Emotionally Abused: No    Physically Abused: No    Sexually Abused: No  Depression (PHQ2-9): Low Risk (07/10/2023)   Depression (PHQ2-9)    PHQ-2 Score: 0  Alcohol Screen: Low Risk (06/05/2023)   Alcohol Screen    Last Alcohol Screening Score (AUDIT): 0  Housing: Unknown (06/05/2023)   Housing Stability Vital Sign    Unable to Pay for Housing in the Last Year: No    Number of Times Moved in the Last Year: Not on file    Homeless in the Last Year: No  Utilities: Not At Risk (06/05/2023)   AHC Utilities    Threatened with loss of utilities: No  Health Literacy: Adequate Health Literacy (06/05/2023)   B1300 Health Literacy    Frequency of need for help with medical instructions: Never     Review of Systems    General:  No chills, fever, night sweats or weight changes.  Cardiovascular:  No chest pain, dyspnea on exertion, edema, orthopnea, palpitations, paroxysmal nocturnal dyspnea. Dermatological: No rash, lesions/masses Respiratory: No cough, dyspnea Urologic: No hematuria, dysuria Abdominal:   No nausea, vomiting, diarrhea, bright red blood per rectum, melena, or hematemesis Neurologic:  No visual changes, wkns, changes in mental  status. All other systems reviewed and are otherwise negative except as noted above.  Physical Exam    VS:  BP 138/74   Pulse 70   Ht 5' 5 (1.651 m)   Wt 97 lb (44 kg)   SpO2 92%   BMI 16.14 kg/m  , BMI Body mass index is 16.14 kg/m. GEN: Well nourished, well developed, in no acute distress. HEENT: normal. Neck: Supple, no JVD, carotid bruits, or masses. Cardiac: RRR, 3/6 systolic murmur heard along right sternal border, rubs, or gallops. No clubbing, cyanosis, generalized ankle edema.  Radials/DP/PT 2+ and equal bilaterally.  Respiratory:  Respirations regular and unlabored, clear to auscultation bilaterally. GI: Soft, nontender, nondistended, BS + x 4. MS: no deformity or atrophy. Skin: warm and dry, no rash. Neuro:  Strength and sensation are intact. Psych: Normal affect.  Accessory Clinical Findings    Recent Labs: 05/05/2023: TSH 3.58 10/11/2023: Hemoglobin 14.7; Platelets 175 02/29/2024: BUN 24; Creatinine, Ser 0.76; Potassium 3.9; Sodium 137   Recent Lipid Panel    Component Value Date/Time   CHOL 153 01/25/2023 0818   CHOL 161 09/21/2021 1600   TRIG 125 01/25/2023 0819   HDL 51 01/25/2023 0818   HDL 84 09/21/2021 1600   CHOLHDL 3.0 01/25/2023 0818   VLDL 18 01/25/2023 0818   LDLCALC 84 01/25/2023 0818   LDLCALC 67 09/21/2021 1600   LDLCALC 90 11/14/2019 1102         ECG personally reviewed by me today-none today.EKG Interpretation Date/Time:  Wednesday May 01 2024 11:14:57 EST Ventricular Rate:  70 PR Interval:    QRS Duration:  130 QT Interval:  466 QTC Calculation: 503 R Axis:   -  12  Text Interpretation: Ventricular-paced rhythm When compared with ECG of 13-Feb-2024 13:13, No significant change was found Confirmed by Emelia Hazy [phone]) on 05/01/2024 11:16:44 AM    Echocardiogram 01/25/23  IMPRESSIONS     1. Left ventricular ejection fraction, by estimation, is 60 to 65%. The  left ventricle has normal function. The left ventricle has no  regional  wall motion abnormalities. Left ventricular diastolic function could not  be evaluated.   2. Right ventricular systolic function is hyperdynamic. The right  ventricular size is mildly enlarged. There is moderately elevated  pulmonary artery systolic pressure. The estimated right ventricular  systolic pressure is 48.4 mmHg.   3. Left atrial size was severely dilated.   4. Right atrial size was severely dilated.   5. A small pericardial effusion is present. The pericardial effusion is  circumferential.   6. The mitral valve is normal in structure. Trivial mitral valve  regurgitation. No evidence of mitral stenosis.   7. Tricuspid valve regurgitation is severe.   8. The aortic valve is tricuspid. There is mild calcification of the  aortic valve. There is mild thickening of the aortic valve. Aortic valve  regurgitation is mild. Aortic valve sclerosis/calcification is present,  without any evidence of aortic  stenosis.   9. The inferior vena cava is dilated in size with <50% respiratory  variability, suggesting right atrial pressure of 15 mmHg.   Comparison(s): No significant change from prior study. Prior images  reviewed side by side.   FINDINGS   Left Ventricle: Left ventricular ejection fraction, by estimation, is 60  to 65%. The left ventricle has normal function. The left ventricle has no  regional wall motion abnormalities. The left ventricular internal cavity  size was normal in size. There is   no left ventricular hypertrophy. Left ventricular diastolic function  could not be evaluated due to atrial fibrillation. Left ventricular  diastolic function could not be evaluated.   Right Ventricle: The right ventricular size is mildly enlarged. No  increase in right ventricular wall thickness. Right ventricular systolic  function is hyperdynamic. There is moderately elevated pulmonary artery  systolic pressure. The tricuspid  regurgitant velocity is 2.89 m/s, and with an  assumed right atrial  pressure of 15 mmHg, the estimated right ventricular systolic pressure is  48.4 mmHg.   Left Atrium: Left atrial size was severely dilated.   Right Atrium: Right atrial size was severely dilated.   Pericardium: A small pericardial effusion is present. The pericardial  effusion is circumferential.   Mitral Valve: The mitral valve is normal in structure. Mild to moderate  mitral annular calcification. Trivial mitral valve regurgitation. No  evidence of mitral valve stenosis.   Tricuspid Valve: The tricuspid valve is normal in structure. Tricuspid  valve regurgitation is severe.   Aortic Valve: The noncoronary cusp is severely calcified and has severely  restricted motion. The aortic valve is tricuspid. There is mild  calcification of the aortic valve. There is mild thickening of the aortic  valve. Aortic valve regurgitation is mild.   Aortic regurgitation PHT measures 715 msec. Aortic valve  sclerosis/calcification is present, without any evidence of aortic  stenosis.   Pulmonic Valve: The pulmonic valve was normal in structure. Pulmonic valve  regurgitation is not visualized. No evidence of pulmonic stenosis.   Aorta: The aortic root and ascending aorta are structurally normal, with  no evidence of dilitation.   Venous: The inferior vena cava is dilated in size with less than 50%  respiratory  variability, suggesting right atrial pressure of 15 mmHg.   IAS/Shunts: No atrial level shunt detected by color flow Doppler.    Echocardiogram 01/17/2024  IMPRESSIONS     1. Left ventricular ejection fraction, by estimation, is 60 to 65%. The  left ventricle has normal function. The left ventricle has no regional  wall motion abnormalities. Left ventricular diastolic parameters were  normal.   2. Right ventricular systolic function is normal. The right ventricular  size is normal. There is mildly elevated pulmonary artery systolic  pressure.   3. Left atrial  size was severely dilated.   4. Right atrial size was severely dilated.   5. A small pericardial effusion is present.   6. The mitral valve is normal in structure. Mild mitral valve  regurgitation. No evidence of mitral stenosis. Moderate mitral annular  calcification.   7. Tricuspid valve regurgitation is severe.   8. The aortic valve is tricuspid. There is moderate calcification of the  aortic valve. There is moderate thickening of the aortic valve. Aortic  valve regurgitation is moderate. No aortic stenosis is present. Aortic  regurgitation PHT measures 561 msec.   9. Pulmonic valve regurgitation is moderate.  10. The inferior vena cava is dilated in size with >50% respiratory  variability, suggesting right atrial pressure of 8 mmHg.   FINDINGS   Left Ventricle: Left ventricular ejection fraction, by estimation, is 60  to 65%. The left ventricle has normal function. The left ventricle has no  regional wall motion abnormalities. The left ventricular internal cavity  size was normal in size. There is   no left ventricular hypertrophy. Left ventricular diastolic parameters  were normal. Normal left ventricular filling pressure.   Right Ventricle: The right ventricular size is normal. No increase in  right ventricular wall thickness. Right ventricular systolic function is  normal. There is mildly elevated pulmonary artery systolic pressure. The  tricuspid regurgitant velocity is 2.75   m/s, and with an assumed right atrial pressure of 8 mmHg, the estimated  right ventricular systolic pressure is 38.2 mmHg.   Left Atrium: Left atrial size was severely dilated.   Right Atrium: Right atrial size was severely dilated.   Pericardium: A small pericardial effusion is present.   Mitral Valve: The mitral valve is normal in structure. Moderate mitral  annular calcification. Mild mitral valve regurgitation. No evidence of  mitral valve stenosis.   Tricuspid Valve: The tricuspid valve is  normal in structure. Tricuspid  valve regurgitation is severe. No evidence of tricuspid stenosis.   Aortic Valve: The aortic valve is tricuspid. There is moderate  calcification of the aortic valve. There is moderate thickening of the  aortic valve. Aortic valve regurgitation is moderate. Aortic regurgitation  PHT measures 561 msec. No aortic stenosis is  present.   Pulmonic Valve: The pulmonic valve was normal in structure. Pulmonic valve  regurgitation is moderate. No evidence of pulmonic stenosis.   Aorta: The aortic root is normal in size and structure.   Venous: The inferior vena cava is dilated in size with greater than 50%  respiratory variability, suggesting right atrial pressure of 8 mmHg.   IAS/Shunts: No atrial level shunt detected by color flow Doppler.   Assessment & Plan   1.  DOE, chest discomfort-breathing improved with diuresis.  Her echocardiogram showed normal LVEF and small pericardial effusion.  She was also noted to have mildly elevated pulmonary artery systolic pressure.  Details above. Lower extremity support stockings-reviewed Elevate lower extremities when not active Heart  healthy low-sodium diet-reviewed Increase physical activity as tolerated Change furosemide  20 mg Tues and Thurs. Change potassium 10 mill equivalents to take with furosemide   Atrial fibrillation-EKG today shows V paced rhythm 70 bpm.  She is  post AV nodal ablation and status post PPM.  She previously noted vibration type sensation with pacemaker and had adjustments made with EP.  She continues to be compliant with Pradaxa .  Denies bleeding issues.  She denies accelerated or irregular heartbeats.  Device interrogated 12/25.  V paced.  Battery and lead parameters stable.  Programming continues to be appropriate.   Continue bisoprolol , Pradaxa  Avoid triggers caffeine, chocolate, EtOH, dehydration etc.  Essential hypertension-BP today 138/74 Maintain blood pressure log Low-sodium  diet Continue bisoprolol , losartan   History of CVA-noted to have pressured speech, poor historian.  Husband assists with history.  Previously was on Eliquis  and transition to Pradaxa . Follows with PCP  Disposition: Follow-up with Dr. Loni or me in 4 months.   Josefa HERO. Kailen Name NP-C     05/01/2024, 11:57 AM [facility] [address] [place], [place] [address] Office [phone]      I spent 14 minutes examining this patient, reviewing medications, and using patient centered shared decision making involving their cardiac care.   I spent  20 minutes reviewing past medical history,  medications, and prior cardiac tests.  "

## 2024-04-30 ENCOUNTER — Ambulatory Visit: Admitting: General Practice

## 2024-05-01 ENCOUNTER — Encounter: Payer: Self-pay | Admitting: General Practice

## 2024-05-01 ENCOUNTER — Ambulatory Visit: Admitting: General Practice

## 2024-05-01 VITALS — BP 138/74 | HR 70 | Ht 65.0 in | Wt 97.0 lb

## 2024-05-01 DIAGNOSIS — I639 Cerebral infarction, unspecified: Secondary | ICD-10-CM | POA: Insufficient documentation

## 2024-05-01 DIAGNOSIS — I10 Essential (primary) hypertension: Secondary | ICD-10-CM | POA: Diagnosis present

## 2024-05-01 DIAGNOSIS — R0789 Other chest pain: Secondary | ICD-10-CM | POA: Insufficient documentation

## 2024-05-01 DIAGNOSIS — R0609 Other forms of dyspnea: Secondary | ICD-10-CM | POA: Diagnosis not present

## 2024-05-01 DIAGNOSIS — I4821 Permanent atrial fibrillation: Secondary | ICD-10-CM | POA: Diagnosis not present

## 2024-05-01 MED ORDER — POTASSIUM CHLORIDE CRYS ER 10 MEQ PO TBCR: 10.0000 meq | EXTENDED_RELEASE_TABLET | ORAL | 3 refills | Status: AC

## 2024-05-01 NOTE — Patient Instructions (Signed)
 Medication Instructions:  Decrease Furosemide  (Lasix ) 20 mg take 1 tablet twice weekly. (Tuesdays, Thursdays) Take Potassium on the days your take Lasix  (Twice Weekly)  *If you need a refill on your cardiac medications before your next appointment, please call your pharmacy*  Lab Work: NONE ordered at this time of appointment   Testing/Procedures: NONE ordered at this time of appointment   Follow-Up: At [facility], you and your health needs are our priority.  As part of our continuing mission to provide you with exceptional heart care, our providers are all part of one team.  This team includes your primary Cardiologist (physician) and Advanced Practice Providers or APPs (Physician Assistants and Nurse Practitioners) who all work together to provide you with the care you need, when you need it.  Your next appointment:   4 month(s)  Provider:   Soyla DELENA Merck, MD or Josefa Beauvais, NP          We recommend signing up for the patient portal called MyChart.  Sign up information is provided on this After Visit Summary.  MyChart is used to connect with patients for Virtual Visits (Telemedicine).  Patients are able to view lab/test results, encounter notes, upcoming appointments, etc.  Non-urgent messages can be sent to your provider as well.   To learn more about what you can do with MyChart, go to [email or web address].

## 2024-05-07 NOTE — Progress Notes (Unsigned)
" °  Electrophysiology Office Note:   Date:  05/08/2024  ID:  [facility] Mary Mercado, Mary Mercado 1940-12-07, MRN [id number]  Primary Cardiologist: Soyla DELENA Merck, MD Primary Heart Failure: None Electrophysiologist: None  \    History of Present Illness:   Mary Mercado is a 84 y.o. female with h/o atrial fibrillation, CVA, hypertension, hyperlipidemia seen today for routine electrophysiology followup.   Discussed the use of AI scribe software for clinical note transcription with the patient, who gave verbal consent to proceed.  History of Present Illness Mary Mercado is an 84 year old female who presents for a routine pacemaker follow-up visit. She is accompanied by her husband, [name].  She feels 'in a fog' and experiences poor vision, impacting her ability to perceive her surroundings.  She reports a slight burning sensation around the pacemaker site, which is mild and inconsistent, not causing significant discomfort.  She inquires about the procedure for changing the pacemaker battery, anticipated in approximately eight years, expressing concern due to her age at that time.  Her hands are often cold, making it difficult to obtain accurate oxygen  level readings.   she denies chest pain, palpitations, dyspnea, PND, orthopnea, nausea, vomiting, dizziness, syncope, edema, weight gain, or early satiety.   Review of systems complete and found to be negative unless listed in HPI.      EP Information / Studies Reviewed:    EKG is not ordered today. EKG from 05/01/2018 reviewed which showed atrial fibrillation, ventricular paced      PPM Interrogation-  reviewed in detail today,  See PACEART report.  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 The patient's score is based  upon: CHF History: 0 HTN History: 1 Diabetes History: 0 Stroke History: 0 Vascular Disease History: 0 Age Score: 2 Gender Score: 1            Physical Exam:   VS:  BP 138/70 (BP Location: Left Arm, Patient Position: Sitting, Cuff Size: Small)   Pulse 70   Ht 5' 5 (1.651 m)   Wt 97 lb 12.8 oz (44.4 kg)   BMI 16.27 kg/m    Wt Readings from Last 3 Encounters:  05/08/24 97 lb 12.8 oz (44.4 kg)  05/01/24 97 lb (44 kg)  02/13/24 98 lb (44.5 kg)     GEN: Well nourished, well developed in no acute distress NECK: No JVD; No carotid bruits CARDIAC: Regular rate and rhythm, no murmurs, rubs, gallops RESPIRATORY:  Clear to auscultation without rales, wheezing or rhonchi  ABDOMEN: Soft, non-tender, non-distended EXTREMITIES:  No edema; No deformity   ASSESSMENT AND PLAN:    Atrial fibrillation post AV node ablation s/p Medtronic PPM  Normal PPM function See Pace Art report No changes today  2.  Secondary hypercoagulable state: Continue Pradaxa   Disposition:   Follow up with EP Team in 12 months  Signed, Makayla Lanter Gladis Norton, MD  "

## 2024-05-08 ENCOUNTER — Ambulatory Visit: Admitting: Cardiology

## 2024-05-08 ENCOUNTER — Encounter: Payer: Self-pay | Admitting: Cardiology

## 2024-05-08 VITALS — BP 138/70 | HR 70 | Ht 65.0 in | Wt 97.8 lb

## 2024-05-08 DIAGNOSIS — I4821 Permanent atrial fibrillation: Secondary | ICD-10-CM | POA: Diagnosis present

## 2024-05-08 DIAGNOSIS — D6869 Other thrombophilia: Secondary | ICD-10-CM | POA: Insufficient documentation

## 2024-05-08 LAB — CUP PACEART INCLINIC DEVICE CHECK
Date Time Interrogation Session: 20260114202228
Implantable Lead Connection Status: 753985
Implantable Lead Connection Status: 753985
Implantable Lead Implant Date: 20211206
Implantable Lead Implant Date: 20211206
Implantable Lead Location: 753860
Implantable Lead Location: 753860
Implantable Lead Model: 3830
Implantable Lead Model: 5076
Implantable Pulse Generator Implant Date: 20211206

## 2024-05-08 NOTE — Patient Instructions (Signed)
 Medication Instructions:  Your physician recommends that you continue on your current medications as directed. Please refer to the Current Medication list given to you today.  *If you need a refill on your cardiac medications before your next appointment, please call your pharmacy*  Lab Work: None ordered  Testing/Procedures: None ordered  Follow-Up: At [facility], you and your health needs are our priority.  As part of our continuing mission to provide you with exceptional heart care, our providers are all part of one team.  This team includes your primary Cardiologist (physician) and Advanced Practice Providers or APPs (Physician Assistants and Nurse Practitioners) who all work together to provide you with the care you need, when you need it.  Your next appointment:   1 year(s)  Provider:   You will see one of the following Advanced Practice Providers on your designated Care Team:   Charlies Arthur, PA-C Michael Andy Tillery, PA-C Suzann Riddle, NP Daphne Barrack, NP Artist Pouch, PA-C    Thank you for choosing [facility]!!   Maeola Domino, RN [phone]

## 2024-05-10 ENCOUNTER — Other Ambulatory Visit: Payer: Self-pay | Admitting: General Practice

## 2024-05-10 ENCOUNTER — Ambulatory Visit: Payer: Self-pay | Admitting: Cardiology

## 2024-05-13 NOTE — Telephone Encounter (Signed)
 In accordance with refill protocols, please review and address the following requirements before this medication refill can be authorized:  Labs

## 2024-05-27 ENCOUNTER — Telehealth: Payer: Self-pay | Admitting: Adult Health

## 2024-05-27 ENCOUNTER — Ambulatory Visit: Admitting: Adult Health

## 2024-05-27 NOTE — Telephone Encounter (Signed)
 LVM and sent mychart msg- office closed 2/2

## 2024-06-10 ENCOUNTER — Ambulatory Visit: Payer: Medicare Other

## 2024-06-27 ENCOUNTER — Ambulatory Visit

## 2024-08-30 ENCOUNTER — Ambulatory Visit: Admitting: General Practice

## 2024-09-26 ENCOUNTER — Ambulatory Visit

## 2024-12-26 ENCOUNTER — Ambulatory Visit

## 2025-03-27 ENCOUNTER — Ambulatory Visit

## 2025-06-26 ENCOUNTER — Ambulatory Visit
